# Patient Record
Sex: Female | Born: 1945
Health system: Southern US, Community
[De-identification: ages and names within clinical notes are randomized; demographics above are authoritative.]

## PROBLEM LIST (undated history)

## (undated) DIAGNOSIS — Z923 Personal history of irradiation: Secondary | ICD-10-CM

## (undated) DIAGNOSIS — J449 Chronic obstructive pulmonary disease, unspecified: Secondary | ICD-10-CM

## (undated) DIAGNOSIS — K56609 Unspecified intestinal obstruction, unspecified as to partial versus complete obstruction: Secondary | ICD-10-CM

## (undated) DIAGNOSIS — R0600 Dyspnea, unspecified: Secondary | ICD-10-CM

## (undated) DIAGNOSIS — C50912 Malignant neoplasm of unspecified site of left female breast: Secondary | ICD-10-CM

## (undated) DIAGNOSIS — Z720 Tobacco use: Secondary | ICD-10-CM

## (undated) DIAGNOSIS — G629 Polyneuropathy, unspecified: Secondary | ICD-10-CM

## (undated) DIAGNOSIS — G8929 Other chronic pain: Secondary | ICD-10-CM

## (undated) DIAGNOSIS — M5136 Other intervertebral disc degeneration, lumbar region: Secondary | ICD-10-CM

## (undated) DIAGNOSIS — Z9221 Personal history of antineoplastic chemotherapy: Secondary | ICD-10-CM

## (undated) DIAGNOSIS — E785 Hyperlipidemia, unspecified: Secondary | ICD-10-CM

## (undated) DIAGNOSIS — Z8489 Family history of other specified conditions: Secondary | ICD-10-CM

## (undated) DIAGNOSIS — K219 Gastro-esophageal reflux disease without esophagitis: Secondary | ICD-10-CM

## (undated) DIAGNOSIS — C189 Malignant neoplasm of colon, unspecified: Secondary | ICD-10-CM

## (undated) DIAGNOSIS — I10 Essential (primary) hypertension: Secondary | ICD-10-CM

## (undated) DIAGNOSIS — M810 Age-related osteoporosis without current pathological fracture: Secondary | ICD-10-CM

## (undated) DIAGNOSIS — D509 Iron deficiency anemia, unspecified: Secondary | ICD-10-CM

## (undated) DIAGNOSIS — E559 Vitamin D deficiency, unspecified: Secondary | ICD-10-CM

## (undated) DIAGNOSIS — Z972 Presence of dental prosthetic device (complete) (partial): Secondary | ICD-10-CM

## (undated) DIAGNOSIS — M549 Dorsalgia, unspecified: Secondary | ICD-10-CM

## (undated) DIAGNOSIS — M1990 Unspecified osteoarthritis, unspecified site: Secondary | ICD-10-CM

## (undated) HISTORY — PX: COLON SURGERY: SHX602

## (undated) HISTORY — PX: JOINT REPLACEMENT: SHX530

## (undated) HISTORY — DX: Other chronic pain: G89.29

## (undated) HISTORY — DX: Dorsalgia, unspecified: M54.9

## (undated) HISTORY — PX: FRACTURE SURGERY: SHX138

## (undated) HISTORY — DX: Polyneuropathy, unspecified: G62.9

## (undated) HISTORY — PX: WRIST FRACTURE SURGERY: SHX121

## (undated) HISTORY — PX: COLONOSCOPY W/ POLYPECTOMY: SHX1380

## (undated) HISTORY — DX: Vitamin D deficiency, unspecified: E55.9

## (undated) HISTORY — DX: Unspecified osteoarthritis, unspecified site: M19.90

## (undated) HISTORY — DX: Other intervertebral disc degeneration, lumbar region: M51.36

## (undated) HISTORY — DX: Tobacco use: Z72.0

## (undated) HISTORY — DX: Age-related osteoporosis without current pathological fracture: M81.0

## (undated) HISTORY — PX: BREAST SURGERY: SHX581

## (undated) HISTORY — DX: Unspecified intestinal obstruction, unspecified as to partial versus complete obstruction: K56.609

## (undated) HISTORY — DX: Malignant neoplasm of colon, unspecified: C18.9

## (undated) HISTORY — DX: Essential (primary) hypertension: I10

## (undated) HISTORY — DX: Hyperlipidemia, unspecified: E78.5

## (undated) HISTORY — DX: Iron deficiency anemia, unspecified: D50.9

---
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
## 2002-09-30 DIAGNOSIS — C50912 Malignant neoplasm of unspecified site of left female breast: Secondary | ICD-10-CM

## 2002-09-30 HISTORY — DX: Malignant neoplasm of unspecified site of left female breast: C50.912

## 2003-08-01 HISTORY — PX: BREAST EXCISIONAL BIOPSY: SUR124

## 2004-08-16 ENCOUNTER — Ambulatory Visit: Payer: Self-pay | Admitting: Oncology

## 2004-08-30 ENCOUNTER — Ambulatory Visit: Payer: Self-pay | Admitting: Oncology

## 2004-10-25 ENCOUNTER — Ambulatory Visit: Payer: Self-pay | Admitting: Specialist

## 2004-11-01 ENCOUNTER — Ambulatory Visit: Payer: Self-pay | Admitting: Oncology

## 2004-11-28 ENCOUNTER — Ambulatory Visit: Payer: Self-pay | Admitting: Oncology

## 2005-01-31 ENCOUNTER — Ambulatory Visit: Payer: Self-pay | Admitting: Oncology

## 2005-02-28 ENCOUNTER — Ambulatory Visit: Payer: Self-pay | Admitting: Oncology

## 2005-06-26 ENCOUNTER — Ambulatory Visit: Payer: Self-pay | Admitting: Oncology

## 2005-07-29 ENCOUNTER — Ambulatory Visit: Payer: Self-pay | Admitting: Oncology

## 2005-07-31 ENCOUNTER — Ambulatory Visit: Payer: Self-pay | Admitting: Oncology

## 2005-08-30 ENCOUNTER — Ambulatory Visit: Payer: Self-pay | Admitting: Oncology

## 2005-10-17 ENCOUNTER — Ambulatory Visit: Payer: Self-pay | Admitting: Oncology

## 2005-10-31 ENCOUNTER — Ambulatory Visit: Payer: Self-pay | Admitting: Oncology

## 2005-11-28 ENCOUNTER — Ambulatory Visit: Payer: Self-pay | Admitting: Oncology

## 2006-03-24 ENCOUNTER — Ambulatory Visit: Payer: Self-pay | Admitting: Oncology

## 2006-03-30 ENCOUNTER — Ambulatory Visit: Payer: Self-pay | Admitting: Oncology

## 2006-05-30 ENCOUNTER — Ambulatory Visit: Payer: Self-pay | Admitting: Oncology

## 2006-09-15 ENCOUNTER — Ambulatory Visit: Payer: Self-pay | Admitting: Oncology

## 2006-09-30 ENCOUNTER — Ambulatory Visit: Payer: Self-pay | Admitting: Oncology

## 2007-03-01 ENCOUNTER — Ambulatory Visit: Payer: Self-pay | Admitting: Oncology

## 2007-03-20 ENCOUNTER — Ambulatory Visit: Payer: Self-pay | Admitting: Oncology

## 2007-03-31 ENCOUNTER — Ambulatory Visit: Payer: Self-pay | Admitting: Oncology

## 2007-04-27 ENCOUNTER — Ambulatory Visit: Payer: Self-pay | Admitting: General Surgery

## 2007-06-15 ENCOUNTER — Ambulatory Visit: Payer: Self-pay | Admitting: Nurse Practitioner

## 2007-09-21 ENCOUNTER — Ambulatory Visit: Payer: Self-pay | Admitting: Oncology

## 2007-10-01 ENCOUNTER — Ambulatory Visit: Payer: Self-pay | Admitting: Oncology

## 2007-12-15 ENCOUNTER — Ambulatory Visit: Payer: Self-pay | Admitting: Oncology

## 2007-12-23 ENCOUNTER — Ambulatory Visit: Payer: Self-pay | Admitting: Family Medicine

## 2008-03-22 ENCOUNTER — Ambulatory Visit: Payer: Self-pay | Admitting: Oncology

## 2008-03-23 ENCOUNTER — Ambulatory Visit: Payer: Self-pay | Admitting: Oncology

## 2008-03-30 ENCOUNTER — Ambulatory Visit: Payer: Self-pay | Admitting: Oncology

## 2008-07-18 ENCOUNTER — Ambulatory Visit: Payer: Self-pay | Admitting: Oncology

## 2008-07-26 ENCOUNTER — Encounter: Payer: Self-pay | Admitting: Physical Medicine and Rehabilitation

## 2008-07-31 ENCOUNTER — Encounter: Payer: Self-pay | Admitting: Physical Medicine and Rehabilitation

## 2008-08-30 ENCOUNTER — Ambulatory Visit: Payer: Self-pay | Admitting: Oncology

## 2008-08-30 ENCOUNTER — Encounter: Payer: Self-pay | Admitting: Physical Medicine and Rehabilitation

## 2008-09-15 ENCOUNTER — Ambulatory Visit: Payer: Self-pay | Admitting: Oncology

## 2008-09-30 ENCOUNTER — Ambulatory Visit: Payer: Self-pay | Admitting: Oncology

## 2011-03-17 ENCOUNTER — Emergency Department: Payer: Self-pay | Admitting: Emergency Medicine

## 2011-12-30 HISTORY — PX: NECK SURGERY: SHX720

## 2012-01-25 HISTORY — PX: HIP FRACTURE SURGERY: SHX118

## 2012-01-26 ENCOUNTER — Inpatient Hospital Stay: Payer: Self-pay | Admitting: Specialist

## 2012-01-26 LAB — COMPREHENSIVE METABOLIC PANEL
Albumin: 3.5 g/dL (ref 3.4–5.0)
Alkaline Phosphatase: 87 U/L (ref 50–136)
BUN: 19 mg/dL — ABNORMAL HIGH (ref 7–18)
Calcium, Total: 8.7 mg/dL (ref 8.5–10.1)
Chloride: 106 mmol/L (ref 98–107)
Co2: 25 mmol/L (ref 21–32)
Creatinine: 0.9 mg/dL (ref 0.60–1.30)
Potassium: 3.9 mmol/L (ref 3.5–5.1)

## 2012-01-26 LAB — CBC WITH DIFFERENTIAL/PLATELET
Basophil #: 0.1 10*3/uL (ref 0.0–0.1)
Basophil %: 0.7 %
Lymphocyte %: 14.5 %
MCV: 97 fL (ref 80–100)
Monocyte #: 1.3 x10 3/mm — ABNORMAL HIGH (ref 0.2–0.9)
Monocyte %: 9.5 %
Neutrophil %: 74.1 %
Platelet: 332 10*3/uL (ref 150–440)
RBC: 4.34 10*6/uL (ref 3.80–5.20)
RDW: 12.8 % (ref 11.5–14.5)
WBC: 13.7 10*3/uL — ABNORMAL HIGH (ref 3.6–11.0)

## 2012-01-26 LAB — TSH: Thyroid Stimulating Horm: 7.78 u[IU]/mL — ABNORMAL HIGH

## 2012-01-26 LAB — APTT: Activated PTT: 28.6 secs (ref 23.6–35.9)

## 2012-01-26 LAB — URINALYSIS, COMPLETE
Bilirubin,UR: NEGATIVE
Glucose,UR: NEGATIVE mg/dL (ref 0–75)
Ketone: NEGATIVE
Leukocyte Esterase: NEGATIVE
Nitrite: NEGATIVE
Specific Gravity: 1.021 (ref 1.003–1.030)
WBC UR: 5 /HPF (ref 0–5)

## 2012-01-26 LAB — CK: CK, Total: 62 U/L (ref 21–215)

## 2012-01-26 LAB — PROTIME-INR: Prothrombin Time: 12.5 secs (ref 11.5–14.7)

## 2012-01-27 LAB — CBC WITH DIFFERENTIAL/PLATELET
Basophil #: 0 10*3/uL (ref 0.0–0.1)
Basophil %: 0.2 %
Eosinophil #: 0 10*3/uL (ref 0.0–0.7)
HCT: 32.1 % — ABNORMAL LOW (ref 35.0–47.0)
Lymphocyte #: 1.9 10*3/uL (ref 1.0–3.6)
Lymphocyte %: 9.7 %
Lymphocytes: 9 %
MCH: 32.6 pg (ref 26.0–34.0)
MCHC: 33.8 g/dL (ref 32.0–36.0)
MCV: 97 fL (ref 80–100)
Monocyte #: 2.6 x10 3/mm — ABNORMAL HIGH (ref 0.2–0.9)
Monocyte %: 12.9 %
Monocytes: 13 %
Neutrophil #: 15.4 10*3/uL — ABNORMAL HIGH (ref 1.4–6.5)
Neutrophil %: 77.2 %
RBC: 3.33 10*6/uL — ABNORMAL LOW (ref 3.80–5.20)
RDW: 12.6 % (ref 11.5–14.5)
WBC: 20 10*3/uL — ABNORMAL HIGH (ref 3.6–11.0)

## 2012-01-27 LAB — BASIC METABOLIC PANEL
Anion Gap: 9 (ref 7–16)
BUN: 15 mg/dL (ref 7–18)
Calcium, Total: 7.7 mg/dL — ABNORMAL LOW (ref 8.5–10.1)
Co2: 25 mmol/L (ref 21–32)
EGFR (African American): >60
EGFR (Non-African Amer.): >60
Glucose: 119 mg/dL — ABNORMAL HIGH (ref 65–99)
Osmolality: 274 (ref 275–301)
Sodium: 136 mmol/L (ref 136–145)

## 2012-01-27 LAB — HEMOGLOBIN A1C: Hemoglobin A1C: 5.8 % (ref 4.2–6.3)

## 2012-01-28 LAB — HEMOGLOBIN: HGB: 9.3 g/dL — ABNORMAL LOW (ref 12.0–16.0)

## 2012-01-28 LAB — URINE CULTURE

## 2012-01-29 LAB — CBC WITH DIFFERENTIAL/PLATELET
Basophil #: 0.1 10*3/uL (ref 0.0–0.1)
Eosinophil %: 0.8 %
HCT: 26.1 % — ABNORMAL LOW (ref 35.0–47.0)
HGB: 8.7 g/dL — ABNORMAL LOW (ref 12.0–16.0)
Lymphocyte #: 1.9 10*3/uL (ref 1.0–3.6)
Lymphocyte %: 14.2 %
MCH: 32.2 pg (ref 26.0–34.0)
MCHC: 33.4 g/dL (ref 32.0–36.0)
MCV: 96 fL (ref 80–100)
Neutrophil #: 9.2 10*3/uL — ABNORMAL HIGH (ref 1.4–6.5)
RBC: 2.71 10*6/uL — ABNORMAL LOW (ref 3.80–5.20)
WBC: 13.2 10*3/uL — ABNORMAL HIGH (ref 3.6–11.0)

## 2012-11-30 ENCOUNTER — Ambulatory Visit: Payer: Self-pay | Admitting: Family Medicine

## 2013-04-30 DIAGNOSIS — M5136 Other intervertebral disc degeneration, lumbar region: Secondary | ICD-10-CM

## 2013-04-30 DIAGNOSIS — M51369 Other intervertebral disc degeneration, lumbar region without mention of lumbar back pain or lower extremity pain: Secondary | ICD-10-CM

## 2013-04-30 HISTORY — DX: Other intervertebral disc degeneration, lumbar region: M51.36

## 2013-04-30 HISTORY — DX: Other intervertebral disc degeneration, lumbar region without mention of lumbar back pain or lower extremity pain: M51.369

## 2013-05-05 ENCOUNTER — Ambulatory Visit: Payer: Self-pay | Admitting: Family Medicine

## 2013-09-29 ENCOUNTER — Encounter: Payer: Self-pay | Admitting: Family Medicine

## 2013-09-30 ENCOUNTER — Encounter: Payer: Self-pay | Admitting: Family Medicine

## 2013-10-31 ENCOUNTER — Encounter: Payer: Self-pay | Admitting: Family Medicine

## 2013-11-28 ENCOUNTER — Encounter: Payer: Self-pay | Admitting: Family Medicine

## 2013-12-02 ENCOUNTER — Ambulatory Visit: Payer: Self-pay | Admitting: Family Medicine

## 2014-06-24 ENCOUNTER — Ambulatory Visit: Payer: Self-pay | Admitting: Family Medicine

## 2014-10-06 DIAGNOSIS — M9903 Segmental and somatic dysfunction of lumbar region: Secondary | ICD-10-CM | POA: Diagnosis not present

## 2014-10-06 DIAGNOSIS — M5136 Other intervertebral disc degeneration, lumbar region: Secondary | ICD-10-CM | POA: Diagnosis not present

## 2014-10-06 DIAGNOSIS — M9905 Segmental and somatic dysfunction of pelvic region: Secondary | ICD-10-CM | POA: Diagnosis not present

## 2014-10-06 DIAGNOSIS — M5431 Sciatica, right side: Secondary | ICD-10-CM | POA: Diagnosis not present

## 2014-12-02 DIAGNOSIS — Z0000 Encounter for general adult medical examination without abnormal findings: Secondary | ICD-10-CM | POA: Diagnosis not present

## 2014-12-02 DIAGNOSIS — Z124 Encounter for screening for malignant neoplasm of cervix: Secondary | ICD-10-CM | POA: Diagnosis not present

## 2014-12-02 DIAGNOSIS — Z23 Encounter for immunization: Secondary | ICD-10-CM | POA: Diagnosis not present

## 2014-12-02 DIAGNOSIS — E559 Vitamin D deficiency, unspecified: Secondary | ICD-10-CM | POA: Diagnosis not present

## 2014-12-02 DIAGNOSIS — R062 Wheezing: Secondary | ICD-10-CM | POA: Diagnosis not present

## 2014-12-02 DIAGNOSIS — L8990 Pressure ulcer of unspecified site, unspecified stage: Secondary | ICD-10-CM | POA: Diagnosis not present

## 2014-12-02 DIAGNOSIS — E039 Hypothyroidism, unspecified: Secondary | ICD-10-CM | POA: Diagnosis not present

## 2014-12-02 LAB — HM PAP SMEAR: HM PAP: NORMAL

## 2015-01-05 ENCOUNTER — Ambulatory Visit
Admit: 2015-01-05 | Disposition: A | Discharge status: Home / Self Care | Payer: Self-pay | Attending: Gastroenterology | Admitting: Gastroenterology

## 2015-01-05 DIAGNOSIS — Z86010 Personal history of colonic polyps: Secondary | ICD-10-CM | POA: Diagnosis not present

## 2015-01-05 DIAGNOSIS — F17200 Nicotine dependence, unspecified, uncomplicated: Secondary | ICD-10-CM | POA: Diagnosis not present

## 2015-01-05 DIAGNOSIS — D123 Benign neoplasm of transverse colon: Secondary | ICD-10-CM | POA: Diagnosis not present

## 2015-01-05 DIAGNOSIS — M179 Osteoarthritis of knee, unspecified: Secondary | ICD-10-CM | POA: Diagnosis not present

## 2015-01-05 DIAGNOSIS — Z1211 Encounter for screening for malignant neoplasm of colon: Secondary | ICD-10-CM | POA: Diagnosis not present

## 2015-01-05 DIAGNOSIS — K640 First degree hemorrhoids: Secondary | ICD-10-CM | POA: Diagnosis not present

## 2015-01-05 DIAGNOSIS — D124 Benign neoplasm of descending colon: Secondary | ICD-10-CM | POA: Diagnosis not present

## 2015-01-05 DIAGNOSIS — R062 Wheezing: Secondary | ICD-10-CM | POA: Diagnosis not present

## 2015-01-05 DIAGNOSIS — R69 Illness, unspecified: Secondary | ICD-10-CM | POA: Diagnosis not present

## 2015-01-05 DIAGNOSIS — K621 Rectal polyp: Secondary | ICD-10-CM | POA: Diagnosis not present

## 2015-01-05 DIAGNOSIS — M7989 Other specified soft tissue disorders: Secondary | ICD-10-CM | POA: Diagnosis not present

## 2015-01-22 NOTE — Consult Note (Signed)
Chief Complaint:   Subjective/Chief Complaint Pt seen/examined. Here with right hip fx. Has hx of Breast CA, OA, COPD. Cont. to smoke. No CP. No cardiac hx.   Brief Assessment:   Cardiac Regular    Respiratory wheezing    Gastrointestinal details normal Soft  Nontender  Bowel sounds normal   Routine Hem:  28-Apr-13 00:21    WBC (CBC) 13.7   Hemoglobin (CBC) 14.1   Platelet Count (CBC) 332  Routine Chem:  28-Apr-13 00:21    Glucose, Serum 146   BUN 19   Creatinine (comp) 0.90   Sodium, Serum 141   Potassium, Serum 3.9   Chloride, Serum 106   CO2, Serum 25   Calcium (Total), Serum 8.7   eGFR (Non-African American) >60  Cardiac:  28-Apr-13 00:21    Troponin I < 0.02  Thyroid:  28-Apr-13 00:21    Thyroid Stimulating Hormone 7.78   Assessment/Plan:  Assessment/Plan:   Plan Pt stable and cleared for ortho. surgery. Will start low dose synthroid. Begin SVN's and advair. Empiric low dose beta-blocker. Nicoderm patch. Will follow.   Electronic Signatures: Idelle Crouch (MD)  (Signed 28-Apr-13 02:01)  Authored: Chief Complaint, Brief Assessment, Lab Results, Assessment/Plan   Last Updated: 28-Apr-13 02:01 by Idelle Crouch (MD)

## 2015-01-22 NOTE — Op Note (Signed)
PATIENT NAME:  Kimberly [name], MELLAND MR#:  [id number] DATE OF BIRTH:  1946-08-29  DATE OF PROCEDURE:  01/26/2012  PREOPERATIVE DIAGNOSIS: Base of the neck fracture, left hip.   POSTOPERATIVE DIAGNOSIS: Base of the neck fracture, left hip.  OPERATION: Open reduction internal fixation left basal neck fracture with Synthes DHS compression device (130 degree four hole plate, 85 mm lag screw, four cortical screws).    SURGEON: Park Breed, MD   ANESTHESIA: General endotracheal.   COMPLICATIONS: None.   DRAINS: Two Hemovacs.   ESTIMATED BLOOD LOSS: 250 mL.  REPLACEMENTS: None.   OPERATIVE PROCEDURE: The patient was brought to the operating room where she underwent satisfactory general endotracheal anesthesia at her insistence. Spinal had been recommended but she refused this. The patient was placed on the fracture table in supine position and padded appropriately. The right leg was flexed and abducted. The left leg was placed in traction. Preoperative x-rays had shown a femoral neck-type fracture but it was not subcapital in nature. It was at the base of the neck. I felt this was extracapsular and that hemiarthroplasty was not necessary for this fracture. Fluoroscopy following reduction showed that indeed the fracture was at the base of the neck and at the base of the intertrochanteric region. The hip was prepped and draped in sterile fashion and a longitudinal incision was made along the lateral aspect of the upper femur. Dissection was carried out sharply through subcutaneous tissue and fascia. Muscle was divided and retractors inserted. A large drill hole was made in the proximal femur laterally and guidepin inserted under fluoroscopic control into good position on AP and lateral views. Pin measured 85 mm within bone. A step-cut reamer was then used to enlarge the opening in the lateral femur. A tap was inserted. The tap would advance only so far and then the neck would start to rotate. I put another  guidepin in to try to control rotation but still would rotate if I tried to go too far. This again stopped at about 85 mm. It was a little shorter than I would generally prefer but did not want to damage the bone further. The tap was removed and an 85 mm lag screw with a 130 degree four hole plate was inserted. Plate was fixed to the shaft with a single screw and the traction was released. A compression screw was inserted to compress the fracture site nicely. Fluoroscopy showed anatomic positioning of the neck on AP and lateral views. The screw was in good position and the head a little shorter than usual but in very hard bone. The remaining screw holes were filled in the shaft and the wound was irrigated. Final x-rays were taken. Hemovacs were inserted. The fascia and muscle were closed with running 0 Vicryl. Subcutaneous tissue was closed over another Hemovac with 2-0 Vicryl and the skin was closed with staples. Dry sterile dressings were applied Hemovac was activated. The patient was transferred to her hospital bed and taken to recovery in good condition.  ____________________________ Park Breed, MD hem:drc D: 01/26/2012 19:24:42 ET T: 01/27/2012 10:34:43 ET JOB#: [id number]  cc: Park Breed, MD, <Dictator> Park Breed MD ELECTRONICALLY SIGNED 01/27/2012 11:38

## 2015-01-22 NOTE — H&P (Signed)
Subjective/Chief Complaint Pain left hip    History of Present Illness 69 year old female fell in her yard last night injuring the left hip.  Unable to walk so brought to Emergency Room where exam and X-rays show a displaced base neck fracture left hip.  Admitted for medical evaluation and surgery.  Seen by Dr Doy Hutching and cleared for surgery.  Thyroid studies abnormal and medicine instituted for this.  Discussed options with patient and she agrees to surger;y.  Risks and benefits of surgery were discussed at length including but not limited to infection, non union, nerve or blood vessed damage, non union, need for repeat surgery, blood clots and lung emboli, and death.  Plan on compression nailing.  INitial X-rays appeared to be a subcapital fracture but a repeat X-rays today shows it to be more of a base neck fracture which I believe can be pinned. Explained this to patient and sisters/daughter..    Past Medical Health Thyroid disease.    Primary Physician DR Fuller [place]   Past Med/Surgical Hx:  breast cancer:   Oncology Protocol: MA.27    Pt is on Arimidex.  ALLERGIES:  Betadine: Other  HOME MEDICATIONS: Medication Instructions Status  PriLOSEC OTC 20 mg oral delayed release tablet 1 tab(s) orally once a day Active  Vitamin D3  orally  Active  calcium   orally daily Active   Family and Social History:   Family History Non-Contributory    Social History positive  tobacco (Current within 1 year), negative ETOH    Place of Living Home  Alone   Review of Systems:   Fever/Chills No    Cough No    Sputum No    Abdominal Pain No   Physical Exam:   GEN well developed, well nourished, no acute distress    HEENT pink conjunctivae    NECK supple    RESP normal resp effort    CARD regular rate    ABD denies tenderness    GU foley catheter in place    LYMPH negative neck    EXTR Left leg pain with range of motion.  circulation/sensation/motor function good.  Slight  shortening and external rotation.    SKIN normal to palpation    NEURO motor/sensory function intact    PSYCH alert, A+O to time, place, person, good insight     Routine Coag:  28-Apr-13 00:21    Activated PTT (APTT) 28.6  Routine Hem:  28-Apr-13 00:21    WBC (CBC) 13.7   RBC (CBC) 4.34   Hemoglobin (CBC) 14.1   Hematocrit (CBC) 41.9   Platelet Count (CBC) 332   MCV 97   MCH 32.5   MCHC 33.7   RDW 12.8   Neutrophil % 74.1   Lymphocyte % 14.5   Monocyte % 9.5   Eosinophil % 1.2   Basophil % 0.7   Neutrophil # 10.1   Lymphocyte # 2.0   Monocyte # 1.3   Eosinophil # 0.2   Basophil # 0.1  Routine Chem:  28-Apr-13 00:21    Glucose, Serum 146   BUN 19   Creatinine (comp) 0.90   Sodium, Serum 141   Potassium, Serum 3.9   Chloride, Serum 106   CO2, Serum 25   Calcium (Total), Serum 8.7  Hepatic:  28-Apr-13 00:21    Bilirubin, Total 0.2   Alkaline Phosphatase 87   SGPT (ALT) 19   SGOT (AST) 17   Total Protein, Serum 7.6   Albumin,  Serum 3.5  Routine Chem:  28-Apr-13 00:21    Osmolality (calc) 286   eGFR (African American) >60   eGFR (Non-African American) >60   Anion Gap 10  Routine Coag:  28-Apr-13 00:21    Prothrombin 12.5   INR 0.9  Cardiac:  28-Apr-13 00:21    Troponin I < 0.02  Thyroid:  28-Apr-13 00:21    Thyroid Stimulating Hormone 7.78  Routine BB:  28-Apr-13 00:21    ABO Group + Rh Type -   Antibody Screen -  Cardiac:  28-Apr-13 00:21    CK, Total 62  Thyroid:  28-Apr-13 00:21    Thyroxine, Free 1.26  Routine BB:  28-Apr-13 01:08    Antibody Screen NEGATIVE  Routine UA:  28-Apr-13 01:08    Color (UA) Yellow   Clarity (UA) Hazy   Glucose (UA) Negative   Bilirubin (UA) Negative   Ketones (UA) Negative   Specific Gravity (UA) 1.021   Blood (UA) 2+   pH (UA) 5.0   Protein (UA) Negative   Nitrite (UA) Negative   Leukocyte Esterase (UA) Negative   RBC (UA) 9 /HPF   WBC (UA) 5 /HPF   Epithelial Cells (UA) 1 /HPF   Mucous (UA)  PRESENT  Blood Glucose:  28-Apr-13 03:13    POCT Blood Glucose 125   Radiology Results: XRay:    28-Apr-13 00:54, Hip Left Complete   Hip Left Complete   REASON FOR EXAM:    fell pain  COMMENTS:       PROCEDURE: DXR - DXR HIP LEFT COMPLETE  - Jan 26 2012 12:54AM     RESULT: Comparison:  None    Findings:    AP and lateral view of the left hip demonstrates a left femoral neck   fracture. The joint space is maintained.    IMPRESSION:     Left femoral neck fracture.  Dictation Site: 3          Verified By: Jennette Banker, M.D., MD    28-Apr-13 00:54, Pelvis AP Only   Pelvis AP Only   REASON FOR EXAM:    fell [pain  COMMENTS:       PROCEDURE: DXR - DXR PELVIS AP ONLY  - Jan 26 2012 12:54AM     RESULT: History: fall    Comparison: None    Findings:    AP pelvis demonstrates left femoral neck fracture. There is no   dislocation. Thesacroiliac joints are unremarkable. There is a   calcification projecting over the right sacrum which may represent soft   tissue calcification versus calcified lymph node versus appendicolith     versus bone island.    IMPRESSION:     Left femoral neck fracture.    Dictation Site: 3          Verified By: Jennette Banker, M.D., MD     Assessment/Admission Diagnosis Displaced base neck fracture left hip    Plan Left hip pinning   Electronic Signatures: Park Breed (MD)  (Signed 28-Apr-13 13:50)  Authored: CHIEF COMPLAINT and HISTORY, PAST MEDICAL/SURGIAL HISTORY, ALLERGIES, HOME MEDICATIONS, FAMILY AND SOCIAL HISTORY, REVIEW OF SYSTEMS, PHYSICAL EXAM, LABS, Radiology, ASSESSMENT AND PLAN   Last Updated: 28-Apr-13 13:50 by Park Breed (MD)

## 2015-01-22 NOTE — Discharge Summary (Signed)
PATIENT NAME:  Kimberly [name], Kimberly [name] MR#:  [id number] DATE OF BIRTH:  08/13/1946  DATE OF ADMISSION:  01/26/2012 DATE OF DISCHARGE:  01/30/2012  FINAL DIAGNOSES:  1. Displaced base of neck fracture, left hip. 2. Chronic obstructive pulmonary disease.  3. Tobacco abuse.  4. Osteoarthritis. 5. Status post breast cancer, lumpectomy with radiation.  6. Status post appendectomy.  HOME MEDICATIONS: 1. Vitamin D 1000 units daily.  2. Prilosec 20 mg daily.  3. Caltrate 1 p.o. daily.   OPERATIONS: 01/26/2012 Open reduction and internal fixation left hip with Synthes compression nail.  COMPLICATIONS: None.   CONSULTATIONS:  Dr. Doy Hutching for [facility].   DISCHARGE MEDICATIONS:  1. Enteric-coated aspirin one p.o. b.i.d.  2. Norco 5/325, 1 p.o. every six hours p.r.n. pain.   HISTORY: The patient is a 69 year old female who tripped the morning of admission outside and injured her left hip. She was brought to the Emergency Room where examination and x-rays revealed a displaced base of neck fracture, left hip. She was admitted for medical clearance and surgery. Risks and benefits of surgery were discussed with the patient. I felt that the fracture was close enough to the intertrochanteric region to accept a compression screw rather than doing a hemiarthroplasty. This was explained to the patient and her family.   PAST MEDICAL HISTORY/ILLNESSES: As above.   MEDICATIONS: As above.   ALLERGIES: Betadine.   REVIEW OF SYSTEMS: Unremarkable.   SOCIAL HISTORY: The patient smokes a pack a day but does not drink. She lives alone.   FAMILY HISTORY: Positive for colon cancer, diabetes, and liver disease.   PHYSICAL EXAMINATION: On admission, the patient was alert and cooperative. Vital signs were normal. The left leg was shortened and externally rotated. She had pain with movement of the hip and good neurovascular status distally. No other significant injuries were noted.   LABORATORY DATA: Laboratory data on  admission was satisfactory.   HOSPITAL COURSE: The patient was taken to surgery later on the day of admission and underwent a compression hip nailing. Postoperatively she did well. Hemoglobin remained in a satisfactory range. She was gradually ambulated. The wound was benign. The patient wished to go home rather than a rehab center due to financial considerations. This was felt to be safe as she was ambulating very well by 01/30/2012. She is discharged home with home physical therapy. She is to place minimal weight on the leg with a walker. She is to return to my office in two weeks for exam and x-ray.   ____________________________ Park Breed, MD hem:bjt D: 01/30/2012 12:22:30 ET T: 01/31/2012 10:08:40 ET JOB#: [id number]  cc: Park Breed, MD, <Dictator> [place] Caryn Section, MD Park Breed MD ELECTRONICALLY SIGNED 01/31/2012 12:54

## 2015-01-22 NOTE — Consult Note (Signed)
PATIENT NAME:  Kimberly [name], Kimberly [name] MR#:  [id number] DATE OF BIRTH:  07-01-1946  DATE OF CONSULTATION:  01/26/2012  REFERRING PHYSICIAN: Earnestine Leys, MD   CONSULTING PHYSICIAN:  Leonie Douglas. Doy Hutching, MD  FAMILY PHYSICIAN: Dr. Lelon Huh.   REASON FOR CONSULTATION: Preop medical clearance prior to hip surgery.   HISTORY OF PRESENT ILLNESS: The patient is a 69 year old female with a history of breast cancer and chronic obstructive pulmonary disease/tobacco abuse who tripped this morning injuring her right hip. She was brought to the Emergency Room where she was found to have a hip fracture and is tentatively scheduled for surgery later today. She has a remote history of breast cancer status post lumpectomy and radiation therapy. She is off oral chemotherapy at this time as she completed her protocol. She continues to smoke. She has a history of chronic obstructive pulmonary disease. No cardiac history. Denies chest pain or shortness of breath. No palpitations. No presyncope or syncope.   PAST MEDICAL HISTORY:  1. Chronic obstructive pulmonary disease/tobacco abuse.  2. Osteoarthritis.  3. Breast cancer status post lumpectomy with adjuvant radiation therapy.  4. Status post appendectomy.   MEDICATIONS:  1. Vitamin D 1000 units p.o. daily.  2. Prilosec 20 mg p.o. daily.  3. Caltrate D one p.o. daily.   ALLERGIES: Betadine.   SOCIAL HISTORY: The patient smokes 1 pack per day. No history of alcohol abuse.   FAMILY HISTORY: Positive colon cancer, diabetes, and liver disease.   REVIEW OF SYSTEMS: CONSTITUTIONAL: No fever or change in weight. EYES: No blurred or double vision. No glaucoma. ENT: No tinnitus or hearing loss. No nasal discharge or bleeding. No difficulty swallowing. RESPIRATORY: No cough or wheezing. No hemoptysis. No painful respiration. CARDIOVASCULAR: There is no chest pain or orthopnea. No palpitations or syncope. GASTROINTESTINAL: No nausea, vomiting, or diarrhea. No abdominal pain.  No change in bowel habits. GU: No dysuria or hematuria. No incontinence. ENDOCRINE: No polyuria or polydipsia. No heat or cold intolerance. HEMATOLOGIC: The patient denies anemia, easy bruising, or bleeding. LYMPHATIC: No swollen glands. MUSCULOSKELETAL: The patient denies pain in her neck, back, shoulders or knees. Does have hip pain. No gout. NEUROLOGIC: No numbness or weakness. Denies migraines, stroke or seizures. PSYCH: The patient denies anxiety, insomnia, or depression.   PHYSICAL EXAMINATION:  GENERAL: The patient is in no acute distress.   VITAL SIGNS: Vital signs remarkable for a blood pressure of 129/87 with a heart rate of 93 and a respiratory rate of 18. She is afebrile.   HEENT: Normocephalic, atraumatic. Pupils are equally round and reactive to light and accommodation. Extraocular movements are intact. Sclerae are anicteric. Conjunctivae are clear. Oropharynx is clear.   NECK: Supple without jugular venous distention or bruits. No adenopathy or thyromegaly is noted.   LUNGS: Scattered wheezes without rales or rhonchi. No dullness. Respiratory effort is normal.   CARDIAC: Regular rate and rhythm. Normal S1, S2. No significant rubs, murmurs, or gallops. PMI is nondisplaced. Chest wall is nontender.   ABDOMEN: Soft, nontender with normoactive bowel sounds. No organomegaly or masses were appreciated. No hernias or bruits were noted.   EXTREMITIES: Without clubbing, cyanosis, or edema. Pulses were 2+ bilaterally.   SKIN: Warm and dry without rash or lesions.   NEUROLOGIC: Cranial nerves II through XII grossly intact. Deep tendon reflexes were symmetric. Motor and sensory exam is nonfocal.   PSYCH: Exam revealed a patient who is alert and oriented to person place, and time. She was cooperative and used good judgment.  LABORATORY, DIAGNOSTIC AND RADIOLOGICAL DATA: EKG revealed sinus rhythm with no acute ischemic changes. Chest x-ray was unremarkable. CBC revealed a white count of  13.7 with a hemoglobin of 14.1. Chemistries revealed a TSH of 7.78 with a glucose of 146 and a BUN of 19 with a creatinine of 0.9 and a sodium of 141 and a potassium of 3.9.   ASSESSMENT:  1. Borderline hypothyroidism.  2. Chronic obstructive pulmonary disease/tobacco abuse.  3. Mild dehydration.  4. Hyperglycemia.  5. Osteoarthritis.  6. Remote history of breast cancer.   PLAN:  1. The patient is medically stable and cleared for orthopedic surgery.  2. Will add empiric perioperative beta blockers at this time.  3. We will begin Advair and DuoNeb SVNs for her chronic obstructive pulmonary disease.  4. Will begin low dose Synthroid for her hypothyroidism.  5. We will supplement oxygen at this time and wean as tolerated.   Thank you for the consultation. Please call if questions arise. We will continue to follow this patient with you while in the hospital.   ____________________________ Leonie Douglas. Doy Hutching, MD jds:ap D: 01/26/2012 02:07:05 ET T: 01/26/2012 10:55:40 ET JOB#: [id number]  cc: Leonie Douglas. Doy Hutching, MD, <Dictator> Kirstie Peri. Caryn Section, MD Lavanna Rog Lennice Sites MD ELECTRONICALLY SIGNED 01/26/2012 20:15

## 2015-04-12 ENCOUNTER — Telehealth: Payer: Self-pay | Admitting: Family Medicine

## 2015-04-12 MED ORDER — HYDROCODONE-ACETAMINOPHEN 5-325 MG PO TABS: 1.0000 | ORAL_TABLET | Freq: Four times a day (QID) | ORAL | Status: DC | PRN

## 2015-04-12 NOTE — Telephone Encounter (Signed)
Pt contacted office for refill request on the following medications: Hydrocodone. Pt would like to pick up the RX when she comes in for her OV tomorrow with Mikki Santee @ 11:30. Thanks TNP

## 2015-04-12 NOTE — Telephone Encounter (Signed)
Refill request for hydrocodone 5/325 Last filled by MD on- 02/28/2015 #120 x0 Last Appt: 12/02/2014 Next Appt: 04/13/2015 Please advise refill?

## 2015-04-13 ENCOUNTER — Encounter: Payer: Self-pay | Admitting: Family Medicine

## 2015-04-13 ENCOUNTER — Ambulatory Visit (INDEPENDENT_AMBULATORY_CARE_PROVIDER_SITE_OTHER): Payer: Commercial Managed Care - HMO | Admitting: Family Medicine

## 2015-04-13 VITALS — BP 112/70 | HR 90 | Temp 98.4°F | Resp 16 | Wt 176.0 lb

## 2015-04-13 DIAGNOSIS — K529 Noninfective gastroenteritis and colitis, unspecified: Secondary | ICD-10-CM

## 2015-04-13 NOTE — Progress Notes (Signed)
Subjective:     Patient ID: Kimberly [name], female   DOB: 12-04-1945, 69 y.o.   MRN: [id number]  Diarrhea  Pertinent negatives include no vomiting.    Chief Complaint  Patient presents with  . Diarrhea    Patient comes in office today with concerns of watery stools since Sunday, patient denies seeing any blood in stool but states that she had one episode were there was mucous in stool   States stool frequency is decreasing from 10 x day to 4 x day. Using an unspecified otc product for diarrhea. No family members have been sick. Using bottled and city water. Has not been eating much since yesterday. Last colonoscopy this year with benign polypectomy. No recent abx use or change in medication.   Review of Systems  Gastrointestinal: Positive for diarrhea. Negative for nausea and vomiting.       Objective:   Physical Exam  Constitutional: She appears well-developed and well-nourished. No distress (accompanied by her daughter).  Pulmonary/Chest: Breath sounds normal.  Abdominal: Soft. There is tenderness (diffuse in upper quadrants). There is no guarding.       Assessment:    1. Gastroenteritis:     Plan:   resuming normal eating pattern and use imodium. Consider ultrasound if persistent RUQ tenderness.

## 2015-04-13 NOTE — Patient Instructions (Signed)
Discussed use of imodium and resuming eating. Consider ultrasound if still tender in right upper abdomen.

## 2015-05-19 ENCOUNTER — Other Ambulatory Visit: Payer: Self-pay

## 2015-05-19 NOTE — Telephone Encounter (Signed)
Patient request refill on Hydrocodone.  

## 2015-05-22 MED ORDER — HYDROCODONE-ACETAMINOPHEN 5-325 MG PO TABS: 1.0000 | ORAL_TABLET | Freq: Four times a day (QID) | ORAL | Status: DC | PRN

## 2015-05-25 ENCOUNTER — Telehealth: Payer: Self-pay | Admitting: Family Medicine

## 2015-05-25 DIAGNOSIS — M549 Dorsalgia, unspecified: Principal | ICD-10-CM

## 2015-05-25 DIAGNOSIS — G8929 Other chronic pain: Secondary | ICD-10-CM

## 2015-05-25 DIAGNOSIS — M5137 Other intervertebral disc degeneration, lumbosacral region: Secondary | ICD-10-CM

## 2015-05-25 NOTE — Telephone Encounter (Signed)
Pt has humana and wants a referral to see an orthopedic dr in [facility].  She said she has seen you and you referred her to someone but they told her it was siatica and there was nothing they could do.  Please advise idf she needs an appt or can we just referr there to someone else.  Call back [phone]

## 2015-05-25 NOTE — Telephone Encounter (Signed)
See below please-aa 

## 2015-05-26 DIAGNOSIS — M549 Dorsalgia, unspecified: Principal | ICD-10-CM

## 2015-05-26 DIAGNOSIS — M5137 Other intervertebral disc degeneration, lumbosacral region: Secondary | ICD-10-CM | POA: Insufficient documentation

## 2015-05-26 DIAGNOSIS — G8929 Other chronic pain: Secondary | ICD-10-CM | POA: Insufficient documentation

## 2015-05-26 DIAGNOSIS — M51379 Other intervertebral disc degeneration, lumbosacral region without mention of lumbar back pain or lower extremity pain: Secondary | ICD-10-CM | POA: Insufficient documentation

## 2015-05-26 NOTE — Telephone Encounter (Signed)
Please refer orthopedics as requested for ddd and back pain.

## 2015-06-30 ENCOUNTER — Other Ambulatory Visit: Payer: Self-pay

## 2015-06-30 DIAGNOSIS — M5136 Other intervertebral disc degeneration, lumbar region: Secondary | ICD-10-CM

## 2015-06-30 MED ORDER — HYDROCODONE-ACETAMINOPHEN 5-325 MG PO TABS: 1.0000 | ORAL_TABLET | Freq: Four times a day (QID) | ORAL | Status: DC | PRN

## 2015-06-30 NOTE — Telephone Encounter (Signed)
Patient sent e mail requesting refill 

## 2015-08-07 ENCOUNTER — Other Ambulatory Visit: Payer: Self-pay | Admitting: *Deleted

## 2015-08-07 DIAGNOSIS — M5136 Other intervertebral disc degeneration, lumbar region: Secondary | ICD-10-CM

## 2015-08-07 MED ORDER — HYDROCODONE-ACETAMINOPHEN 5-325 MG PO TABS: 1.0000 | ORAL_TABLET | Freq: Four times a day (QID) | ORAL | Status: DC | PRN

## 2015-09-15 ENCOUNTER — Other Ambulatory Visit: Payer: Self-pay | Admitting: *Deleted

## 2015-09-15 DIAGNOSIS — M5136 Other intervertebral disc degeneration, lumbar region: Secondary | ICD-10-CM

## 2015-09-15 MED ORDER — HYDROCODONE-ACETAMINOPHEN 5-325 MG PO TABS: 1.0000 | ORAL_TABLET | Freq: Four times a day (QID) | ORAL | Status: DC | PRN

## 2015-10-09 ENCOUNTER — Other Ambulatory Visit: Payer: Self-pay | Admitting: Family Medicine

## 2015-10-16 ENCOUNTER — Encounter: Payer: Self-pay | Admitting: Family Medicine

## 2015-10-16 ENCOUNTER — Ambulatory Visit (INDEPENDENT_AMBULATORY_CARE_PROVIDER_SITE_OTHER): Payer: Commercial Managed Care - HMO | Admitting: Family Medicine

## 2015-10-16 VITALS — BP 148/82 | HR 86 | Temp 98.0°F | Resp 16 | Ht 64.0 in | Wt 175.0 lb

## 2015-10-16 DIAGNOSIS — E559 Vitamin D deficiency, unspecified: Secondary | ICD-10-CM | POA: Diagnosis not present

## 2015-10-16 DIAGNOSIS — E785 Hyperlipidemia, unspecified: Secondary | ICD-10-CM

## 2015-10-16 DIAGNOSIS — N644 Mastodynia: Secondary | ICD-10-CM | POA: Diagnosis not present

## 2015-10-16 NOTE — Progress Notes (Signed)
Patient ID: Kimberly [name], female   DOB: 23-May-1946, 70 y.o.   MRN: [id number]       Patient: Kimberly [name] Female    DOB: 03-01-1946   70 y.o.   MRN: [id number] Visit Date: 10/16/2015  Today's Provider: Lelon Huh, MD   Chief Complaint  Patient presents with  . Breast Pain    X 3 weeks.    Subjective:    HPI Breast pain Patient reports that she has had pain in her left breast for about 3 weeks. She reports that she was helping her son lift wood, and thinks she may have pulled a muscle. The pain started about 3 days later. Patient reports that she also has history of breast cancer. She denies feeling any lumps or noticed any visual changes. Patient reports that she has not been taking any OTC for symptoms. She has applied heat which has helped.   She is also due for follow up of lipids and low vitamin D. Reports she is compliant with medications.   No results found for: CHOL  No results found for: LDLCALC  No results found for: CHOLHDL No results found for: LDLDIRECT  Lab Results  Component Value Date   TSH 7.78* 01/26/2012   Past Medical History  Diagnosis Date  . Degenerative disc disease, lumbar 04/2013  . Cancer (HCC)     breast  . Osteoarthritis     of knee  . Vitamin D deficiency   . Hyperlipidemia   . Neuropathy ([place])   . Chronic knee pain   . Chronic back pain   . Osteoporosis   . Tobacco abuse        Allergies  Allergen Reactions  . Iodine   . Sinus Formula [Cholestatin] Nausea Only   Previous Medications   BUDESONIDE-FORMOTEROL (SYMBICORT) 80-4.5 MCG/ACT INHALER    Inhale 2 puffs into the lungs 2 (two) times daily.   CALCIUM CITRATE-VITAMIN D (CITRACAL/VITAMIN D PO)    Take by mouth.   GABAPENTIN (NEURONTIN) 400 MG CAPSULE    Take 400 mg by mouth 3 (three) times daily.   HYDROCODONE-ACETAMINOPHEN (NORCO/VICODIN) 5-325 MG TABLET    Take 1 tablet by mouth every 6 (six) hours as needed. for pain   NAPROXEN (NAPROSYN) 500 MG TABLET    Take 500 mg by  mouth 2 (two) times daily with a meal.   OMEPRAZOLE (PRILOSEC) 20 MG CAPSULE    Take 20 mg by mouth daily.   PROBIOTIC PRODUCT (PROBIOTIC DAILY PO)    Take by mouth.    Review of Systems  Constitutional: Negative.   Cardiovascular: Negative.   Musculoskeletal: Positive for myalgias.  Skin: Negative.   Neurological: Positive for dizziness and headaches.       Comes and goes.     Social History  Substance Use Topics  . Smoking status: Current Every Day Smoker    Types: Cigarettes  . Smokeless tobacco: Not on file  . Alcohol Use: Not on file   Objective:   BP 148/82 mmHg  Pulse 86  Temp(Src) 98 F (36.7 C)  Resp 16  Ht 5\' 4"  (1.626 m)  Wt 175 lb (79.379 kg)  BMI 30.02 kg/m2  SpO2 97%  Physical Exam  Breast Exam: Tender are about 3cm lateral to center of left breast. BB sized firm nodule in the vicinity which she states has been present for many years since having remote lumpectomy. No masses, lesions, tenderness or other disfiguration of right breast.  Assessment & Plan:     1. Breast pain, left MS versus intrinsic breast tissue pain.  - MM Digital Diagnostic Bilat; Future  2. Vitamin D deficiency  - VITAMIN D 25 Hydroxy (Vit-D Deficiency, Fractures)  3. Hyperlipidemia  - Lipid panel - Renal function panel       Lelon Huh, MD  [facility]

## 2015-10-19 ENCOUNTER — Telehealth: Payer: Self-pay

## 2015-10-19 ENCOUNTER — Other Ambulatory Visit: Payer: Self-pay

## 2015-10-19 DIAGNOSIS — N644 Mastodynia: Secondary | ICD-10-CM

## 2015-10-19 NOTE — Telephone Encounter (Signed)
Spoke with [facility] and they said that since patient is due for her mammogram and having left breast pain we would just order screening mammogram. Diagnostic mammogram and [place]  was discontinued. They will contact patient to have this scheduled.

## 2015-10-19 NOTE — Telephone Encounter (Signed)
Patient calling back to check on order for bilateral mammogram (from what i understood). She states partial order was sent in but not for bilateral. In the recent office note the order for bilateral is pulled down (it says future, im not sure if that makes a difference) [place] told patient they have not heard from [place] with the order. Please review and let pt know thank you-aa

## 2015-10-23 ENCOUNTER — Other Ambulatory Visit: Payer: Self-pay | Admitting: Family Medicine

## 2015-11-01 ENCOUNTER — Other Ambulatory Visit: Payer: Self-pay | Admitting: *Deleted

## 2015-11-01 DIAGNOSIS — M5136 Other intervertebral disc degeneration, lumbar region: Secondary | ICD-10-CM

## 2015-11-01 MED ORDER — HYDROCODONE-ACETAMINOPHEN 5-325 MG PO TABS: 1.0000 | ORAL_TABLET | Freq: Four times a day (QID) | ORAL | Status: DC | PRN

## 2015-11-07 ENCOUNTER — Other Ambulatory Visit: Payer: Self-pay | Admitting: Family Medicine

## 2015-11-09 DIAGNOSIS — E559 Vitamin D deficiency, unspecified: Secondary | ICD-10-CM | POA: Diagnosis not present

## 2015-11-09 DIAGNOSIS — E785 Hyperlipidemia, unspecified: Secondary | ICD-10-CM | POA: Diagnosis not present

## 2015-11-10 ENCOUNTER — Telehealth: Payer: Self-pay

## 2015-11-10 DIAGNOSIS — E785 Hyperlipidemia, unspecified: Secondary | ICD-10-CM | POA: Insufficient documentation

## 2015-11-10 LAB — RENAL FUNCTION PANEL
Albumin: 4 g/dL (ref 3.6–4.8)
BUN / CREAT RATIO: 18 (ref 11–26)
BUN: 15 mg/dL (ref 8–27)
CALCIUM: 9.5 mg/dL (ref 8.7–10.3)
CO2: 25 mmol/L (ref 18–29)
Chloride: 97 mmol/L (ref 96–106)
Creatinine, Ser: 0.82 mg/dL (ref 0.57–1.00)
GFR calc non Af Amer: 74 mL/min/{1.73_m2} (ref >59)
GFR, EST AFRICAN AMERICAN: 85 mL/min/{1.73_m2} (ref >59)
GLUCOSE: 106 mg/dL — AB (ref 65–99)
POTASSIUM: 5 mmol/L (ref 3.5–5.2)
Phosphorus: 4.1 mg/dL (ref 2.5–4.5)
Sodium: 139 mmol/L (ref 134–144)

## 2015-11-10 LAB — LIPID PANEL
CHOLESTEROL TOTAL: 267 mg/dL — AB (ref 100–199)
Chol/HDL Ratio: 7.9 ratio units — ABNORMAL HIGH (ref 0.0–4.4)
HDL: 34 mg/dL — ABNORMAL LOW (ref >39)
Triglycerides: 433 mg/dL — ABNORMAL HIGH (ref 0–149)

## 2015-11-10 LAB — VITAMIN D 25 HYDROXY (VIT D DEFICIENCY, FRACTURES): Vit D, 25-Hydroxy: 12.1 ng/mL — ABNORMAL LOW (ref 30.0–100.0)

## 2015-11-10 MED ORDER — PRAVASTATIN SODIUM 40 MG PO TABS: 40.0000 mg | ORAL_TABLET | Freq: Every day | ORAL | Status: DC

## 2015-11-10 MED ORDER — VITAMIN D (ERGOCALCIFEROL) 1.25 MG (50000 UNIT) PO CAPS: 50000.0000 [IU] | ORAL_CAPSULE | ORAL | Status: DC

## 2015-11-10 NOTE — Telephone Encounter (Signed)
Have sent in 90 day prescription. Please have patient schedule follow up at end of April

## 2015-11-10 NOTE — Telephone Encounter (Signed)
Advised patient of results. Patient wants medications called into [facility]. Ok to send? And do I send it as pravastatin #90 with 0 RF and Vit D #12 with 0 RF? Please advise. Thanks!

## 2015-11-10 NOTE — Telephone Encounter (Signed)
-----   Message from Birdie Sons, MD sent at 11/10/2015  7:54 AM EST ----- Cholesterol is very high at 267. Vitamin d is very low. Need to start pravastatin 40mg  once daily, #30, rf x 3, and vitamin d 50,000 units once daily, #12, rf x 1. Follow up 3 months.

## 2015-11-10 NOTE — Telephone Encounter (Signed)
Left message to call back  

## 2015-11-14 NOTE — Telephone Encounter (Signed)
Left detailed message on pt's vm. Okay per dpr.  

## 2015-12-29 ENCOUNTER — Ambulatory Visit
Admission: RE | Admit: 2015-12-29 | Discharge: 2015-12-29 | Disposition: A | Discharge status: Home / Self Care | Payer: Commercial Managed Care - HMO | Source: Ambulatory Visit | Attending: Family Medicine | Admitting: Family Medicine

## 2015-12-29 ENCOUNTER — Other Ambulatory Visit: Payer: Self-pay | Admitting: Family Medicine

## 2015-12-29 DIAGNOSIS — N644 Mastodynia: Secondary | ICD-10-CM

## 2015-12-29 DIAGNOSIS — R921 Mammographic calcification found on diagnostic imaging of breast: Secondary | ICD-10-CM | POA: Diagnosis not present

## 2016-01-09 ENCOUNTER — Other Ambulatory Visit: Payer: Self-pay | Admitting: *Deleted

## 2016-01-09 DIAGNOSIS — M5136 Other intervertebral disc degeneration, lumbar region: Secondary | ICD-10-CM

## 2016-01-09 MED ORDER — HYDROCODONE-ACETAMINOPHEN 5-325 MG PO TABS: 1.0000 | ORAL_TABLET | Freq: Four times a day (QID) | ORAL | Status: DC | PRN

## 2016-01-11 ENCOUNTER — Telehealth: Payer: Self-pay

## 2016-01-11 DIAGNOSIS — M5136 Other intervertebral disc degeneration, lumbar region: Secondary | ICD-10-CM

## 2016-01-11 NOTE — Telephone Encounter (Signed)
Patient is calling office back to see if prescription for Hydrocodone-acetaminophen is available for pick up. I see noted in the chart that this script was printed on 01/09/16 but I do not see that it is placed in book upfront or in accordion, please advise. KW

## 2016-01-12 MED ORDER — HYDROCODONE-ACETAMINOPHEN 5-325 MG PO TABS: 1.0000 | ORAL_TABLET | Freq: Four times a day (QID) | ORAL | Status: DC | PRN

## 2016-01-12 NOTE — Telephone Encounter (Signed)
Pt advised-aa 

## 2016-01-12 NOTE — Telephone Encounter (Signed)
OK to reprint. Thanks.

## 2016-01-12 NOTE — Telephone Encounter (Signed)
Found RX it was on dr fishers desk but not signed, can we re print this out-aa

## 2016-01-12 NOTE — Telephone Encounter (Signed)
Re printed, ready to be signed.

## 2016-02-06 ENCOUNTER — Other Ambulatory Visit: Payer: Self-pay | Admitting: Family Medicine

## 2016-02-20 ENCOUNTER — Other Ambulatory Visit: Payer: Self-pay | Admitting: *Deleted

## 2016-02-20 DIAGNOSIS — M5136 Other intervertebral disc degeneration, lumbar region: Secondary | ICD-10-CM

## 2016-02-20 MED ORDER — HYDROCODONE-ACETAMINOPHEN 5-325 MG PO TABS: 1.0000 | ORAL_TABLET | Freq: Four times a day (QID) | ORAL | Status: DC | PRN

## 2016-04-05 ENCOUNTER — Encounter: Payer: Self-pay | Admitting: Family Medicine

## 2016-04-05 ENCOUNTER — Ambulatory Visit (INDEPENDENT_AMBULATORY_CARE_PROVIDER_SITE_OTHER): Payer: Commercial Managed Care - HMO | Admitting: Family Medicine

## 2016-04-05 VITALS — BP 110/64 | HR 84 | Temp 98.2°F | Resp 16 | Ht 64.0 in | Wt 174.0 lb

## 2016-04-05 DIAGNOSIS — M810 Age-related osteoporosis without current pathological fracture: Secondary | ICD-10-CM | POA: Diagnosis not present

## 2016-04-05 DIAGNOSIS — E785 Hyperlipidemia, unspecified: Secondary | ICD-10-CM | POA: Diagnosis not present

## 2016-04-05 DIAGNOSIS — M5136 Other intervertebral disc degeneration, lumbar region: Secondary | ICD-10-CM | POA: Diagnosis not present

## 2016-04-05 MED ORDER — HYDROCODONE-ACETAMINOPHEN 7.5-325 MG PO TABS: 1.0000 | ORAL_TABLET | Freq: Four times a day (QID) | ORAL | Status: DC | PRN

## 2016-04-05 MED ORDER — PRAVASTATIN SODIUM 40 MG PO TABS: ORAL_TABLET | ORAL | Status: DC

## 2016-04-05 NOTE — Progress Notes (Signed)
Patient: Kimberly [name] Female    DOB: May 29, 1946   70 y.o.   MRN: [id number] Visit Date: 04/05/2016  Today's Provider: Lelon Huh, MD   Chief Complaint  Patient presents with  . Medication Management   Subjective:    HPI   DDD: Current treatment -Hydrocodone/Ace 5-325mg  q6hs prn. She states she has been taking 2 at a time and that pain has been worsening in her back and medications are no longer effective. She does stay active and walks every day but pain is starting to limit her activities.     Lipid/Cholesterol, Follow-up:   Last seen for this 6 months ago.  Management since that visit includes; prescribing pravastatin 40 mg qd..She states she never received medication from her mail order so she never started it.   Last Lipid Panel:    Component Value Date/Time   CHOL 267* 11/09/2015 1238   TRIG 433* 11/09/2015 1238   HDL 34* 11/09/2015 1238   CHOLHDL 7.9* 11/09/2015 1238   [place] Comment 11/09/2015 1238     ------------------------------------------------------------------------  Follow up osteoporosis She has been off of Fosamax for about 2 years. She had previously taken it for several years without adverse effects. Last BMD was 2014    Allergies  Allergen Reactions  . Iodine   . Sinus Formula [Cholestatin] Nausea Only   Current Meds  Medication Sig  . budesonide-formoterol (SYMBICORT) 80-4.5 MCG/ACT inhaler Inhale 2 puffs into the lungs 2 (two) times daily.  . Calcium Citrate-Vitamin D (CITRACAL/VITAMIN D PO) Take by mouth.  . gabapentin (NEURONTIN) 400 MG capsule TAKE 1 CAPSULE TWICE DAILY  . HYDROcodone-acetaminophen (NORCO/VICODIN) 5-325 MG tablet Take 1 tablet by mouth every 6 (six) hours as needed. for pain  . meloxicam (MOBIC) 15 MG tablet TAKE 1 TABLET EVERY DAY AS NEEDED  . naproxen (NAPROSYN) 500 MG tablet Take 500 mg by mouth 2 (two) times daily with a meal.  . omeprazole (PRILOSEC) 20 MG capsule TAKE 1 CAPSULE EVERY DAY  .  pravastatin (PRAVACHOL) 40 MG tablet TAKE 1 TABLET (40 MG TOTAL) BY MOUTH DAILY.  . Probiotic Product (PROBIOTIC DAILY PO) Take by mouth.  . Vitamin D, Ergocalciferol, (DRISDOL) 50000 units CAPS capsule Take 1 capsule (50,000 Units total) by mouth every 7 (seven) days.    Review of Systems  Constitutional: Negative for fever, chills, appetite change and fatigue.  Respiratory: Negative for chest tightness and shortness of breath.   Cardiovascular: Negative for chest pain and palpitations.  Gastrointestinal: Negative for nausea, vomiting and abdominal pain.  Musculoskeletal: Positive for back pain.  Neurological: Negative for dizziness and weakness.    Social History  Substance Use Topics  . Smoking status: Current Every Day Smoker    Types: Cigarettes  . Smokeless tobacco: Not on file  . Alcohol Use: Not on file   Objective:   BP 110/64 mmHg  Pulse 84  Temp(Src) 98.2 F (36.8 C) (Oral)  Resp 16  Ht 5\' 4"  (1.626 m)  Wt 174 lb (78.926 kg)  BMI 29.85 kg/m2  SpO2 95%  Physical Exam   General Appearance:    Alert, cooperative, no distress  Eyes:    PERRL, conjunctiva/corneas clear, EOM's intact       Lungs:     Clear to auscultation bilaterally, respirations unlabored  Heart:    Regular rate and rhythm  Neurologic:   Awake, alert, oriented x 3. No apparent focal neurological  defect. Tender over lumbar spine.           Assessment & Plan:     1. DDD (degenerPaiative disc disease), lumbar Pain is no longer adequately controlled. Will increase to 7.5 1-2 Q6 hours Stress importance of staying physically active.   - HYDROcodone-acetaminophen (NORCO) 7.5-325 MG tablet; Take 1-2 tablets by mouth every 6 (six) hours as needed for moderate pain.   2. Osteoporosis Off Fosamax for two years. Time to check BMD.   Dispense: 120 tablet; Refill: 0  3. Hyperlipidemia Never received pravastatin in mail. Resent prescription today. Will check labs after she has been on  medication for a few months.  - pravastatin (PRAVACHOL) 40 MG tablet; TAKE 1 TABLET (40 MG TOTAL) BY MOUTH DAILY.  Dispense: 90 tablet; Refill: 3       Lelon Huh, MD  [facility]

## 2016-05-07 ENCOUNTER — Ambulatory Visit
Admission: RE | Admit: 2016-05-07 | Discharge: 2016-05-07 | Disposition: A | Discharge status: Home / Self Care | Payer: Commercial Managed Care - HMO | Source: Ambulatory Visit | Attending: Family Medicine | Admitting: Family Medicine

## 2016-05-07 DIAGNOSIS — Z96642 Presence of left artificial hip joint: Secondary | ICD-10-CM | POA: Insufficient documentation

## 2016-05-07 DIAGNOSIS — E2839 Other primary ovarian failure: Secondary | ICD-10-CM | POA: Diagnosis not present

## 2016-05-07 DIAGNOSIS — M8589 Other specified disorders of bone density and structure, multiple sites: Secondary | ICD-10-CM | POA: Diagnosis not present

## 2016-05-07 DIAGNOSIS — Z720 Tobacco use: Secondary | ICD-10-CM | POA: Diagnosis not present

## 2016-05-07 DIAGNOSIS — M85851 Other specified disorders of bone density and structure, right thigh: Secondary | ICD-10-CM | POA: Diagnosis not present

## 2016-05-07 DIAGNOSIS — M8588 Other specified disorders of bone density and structure, other site: Secondary | ICD-10-CM | POA: Diagnosis not present

## 2016-05-07 DIAGNOSIS — Z780 Asymptomatic menopausal state: Secondary | ICD-10-CM | POA: Insufficient documentation

## 2016-05-07 DIAGNOSIS — M810 Age-related osteoporosis without current pathological fracture: Secondary | ICD-10-CM

## 2016-05-08 ENCOUNTER — Other Ambulatory Visit: Payer: Self-pay | Admitting: *Deleted

## 2016-05-08 DIAGNOSIS — M5136 Other intervertebral disc degeneration, lumbar region: Secondary | ICD-10-CM

## 2016-05-08 MED ORDER — HYDROCODONE-ACETAMINOPHEN 7.5-325 MG PO TABS: 1.0000 | ORAL_TABLET | Freq: Four times a day (QID) | ORAL | 0 refills | Status: DC | PRN

## 2016-07-03 ENCOUNTER — Other Ambulatory Visit: Payer: Self-pay | Admitting: *Deleted

## 2016-07-03 DIAGNOSIS — M5136 Other intervertebral disc degeneration, lumbar region: Secondary | ICD-10-CM

## 2016-07-03 MED ORDER — HYDROCODONE-ACETAMINOPHEN 7.5-325 MG PO TABS: 1.0000 | ORAL_TABLET | Freq: Four times a day (QID) | ORAL | 0 refills | Status: DC | PRN

## 2016-07-12 ENCOUNTER — Ambulatory Visit (INDEPENDENT_AMBULATORY_CARE_PROVIDER_SITE_OTHER): Payer: Commercial Managed Care - HMO | Admitting: Family Medicine

## 2016-07-12 ENCOUNTER — Encounter: Payer: Self-pay | Admitting: Family Medicine

## 2016-07-12 VITALS — BP 122/70 | HR 92 | Temp 98.3°F | Resp 16 | Wt 170.0 lb

## 2016-07-12 DIAGNOSIS — G8929 Other chronic pain: Secondary | ICD-10-CM | POA: Diagnosis not present

## 2016-07-12 DIAGNOSIS — E785 Hyperlipidemia, unspecified: Secondary | ICD-10-CM | POA: Diagnosis not present

## 2016-07-12 DIAGNOSIS — M5136 Other intervertebral disc degeneration, lumbar region: Secondary | ICD-10-CM

## 2016-07-12 DIAGNOSIS — Z23 Encounter for immunization: Secondary | ICD-10-CM | POA: Diagnosis not present

## 2016-07-12 DIAGNOSIS — M549 Dorsalgia, unspecified: Secondary | ICD-10-CM

## 2016-07-12 NOTE — Patient Instructions (Signed)
   Recommend taking 81mg enteric coated aspirin to reduce risk of vascular events such as heart attacks and strokes.    

## 2016-07-12 NOTE — Progress Notes (Signed)
Patient: Kimberly [name] Female    DOB: 07-Jun-1946   70 y.o.   MRN: [id number] Visit Date: 07/12/2016  Today's Provider: Lelon Huh, MD   Chief Complaint  Patient presents with  . Follow-up    DDD  . Hyperlipidemia   Subjective:    HPI Follow up DDD: Patient was last seen for tis problem 3 months ago. Changes made include increasing Hydrocodone to 7.5-325mg  1-2 every 6 hours as needed. Patient was counsled of the improtance of staying physically active. Today patient comes in reporting good compliance with treatment, good tolerance and good symptom control. Patient states she has tried to be more physically active around the house. Takes one every morning, sometimes takes another one or two throughout the day.     Lipid/Cholesterol, Follow-up:   Last seen for this3 months ago.  Management changes since that visit include none. A prescription for Pravastatin was resent into the pharmacy. It was noted that we would recheck labs after patient had been on medication for a few months. Is taking every morning.  . Last Lipid Panel:    Component Value Date/Time   CHOL 267 (H) 11/09/2015 1238   TRIG 433 (H) 11/09/2015 1238   HDL 34 (L) 11/09/2015 1238   CHOLHDL 7.9 (H) 11/09/2015 1238   [place] Comment 11/09/2015 1238    Risk factors for vascular disease include hypercholesterolemia  She reports good compliance with treatment. She is not having side effects.  Current symptoms include none and have been stable. Weight trend: decreasing steadily Prior visit with dietician: no Current diet: well balanced Current exercise: walking  Wt Readings from Last 3 Encounters:  04/05/16 174 lb (78.9 kg)  10/16/15 175 lb (79.4 kg)  04/13/15 176 lb (79.8 kg)    -------------------------------------------------------------------     Allergies  Allergen Reactions  . Iodine   . Sinus Formula [Cholestatin] Nausea Only     Current Outpatient Prescriptions:  .   budesonide-formoterol (SYMBICORT) 80-4.5 MCG/ACT inhaler, Inhale 2 puffs into the lungs 2 (two) times daily., Disp: , Rfl:  .  Calcium Citrate-Vitamin D (CITRACAL/VITAMIN D PO), Take by mouth., Disp: , Rfl:  .  gabapentin (NEURONTIN) 400 MG capsule, TAKE 1 CAPSULE TWICE DAILY, Disp: 180 capsule, Rfl: 3 .  HYDROcodone-acetaminophen (NORCO) 7.5-325 MG tablet, Take 1-2 tablets by mouth every 6 (six) hours as needed for moderate pain., Disp: 120 tablet, Rfl: 0 .  meloxicam (MOBIC) 15 MG tablet, TAKE 1 TABLET EVERY DAY AS NEEDED, Disp: 90 tablet, Rfl: 3 .  naproxen (NAPROSYN) 500 MG tablet, Take 500 mg by mouth 2 (two) times daily with a meal., Disp: , Rfl:  .  omeprazole (PRILOSEC) 20 MG capsule, TAKE 1 CAPSULE EVERY DAY, Disp: 90 capsule, Rfl: 4 .  pravastatin (PRAVACHOL) 40 MG tablet, TAKE 1 TABLET (40 MG TOTAL) BY MOUTH DAILY., Disp: 90 tablet, Rfl: 3 .  Probiotic Product (PROBIOTIC DAILY PO), Take by mouth., Disp: , Rfl:  .  Vitamin D, Ergocalciferol, (DRISDOL) 50000 units CAPS capsule, Take 1 capsule (50,000 Units total) by mouth every 7 (seven) days., Disp: 12 capsule, Rfl: 0  Review of Systems  Constitutional: Negative for appetite change, chills, fatigue and fever.  HENT: Positive for mouth sores (inside mouth on right lower side).   Respiratory: Negative for chest tightness and shortness of breath.   Cardiovascular: Negative for chest pain and palpitations.  Gastrointestinal: Negative for abdominal pain, nausea and vomiting.  Musculoskeletal: Positive for arthralgias (hip pain).  Neurological: Positive for numbness (in legs). Negative for dizziness and weakness.    Social History  Substance Use Topics  . Smoking status: Current Every Day Smoker    Packs/day: 0.75    Types: Cigarettes  . Smokeless tobacco: Never Used  . Alcohol use 0.0 oz/week     Comment: occasionally drinks beer   Objective:   BP 122/70 (BP Location: Right Arm, Patient Position: Sitting, Cuff Size: Large)    Pulse 92   Temp 98.3 F (36.8 C) (Oral)   Resp 16   Wt 170 lb (77.1 kg)   SpO2 94% Comment: room air  BMI 29.18 kg/m   Physical Exam   General Appearance:    Alert, cooperative, no distress  Eyes:    PERRL, conjunctiva/corneas clear, EOM's intact       Lungs:     Clear to auscultation bilaterally, respirations unlabored  Heart:    Regular rate and rhythm  Neurologic:   Awake, alert, oriented x 3. No apparent focal neurological           defect.           Assessment & Plan:     1. DDD (degenerative disc disease), lumbar Pain well controlled on current medication regiment.   2. Chronic back pain, unspecified back location, unspecified back pain laterality   3. Hyperlipidemia, unspecified hyperlipidemia type She is tolerating pravastatin well with no adverse effects.   - Lipid panel - Hepatic function panel  4. Need for influenza vaccination  - Flu vaccine HIGH DOSE PF (Fluzone High dose)       Lelon Huh, MD  [facility]

## 2016-07-13 LAB — LIPID PANEL
CHOLESTEROL TOTAL: 202 mg/dL — AB (ref 100–199)
Chol/HDL Ratio: 5.3 ratio units — ABNORMAL HIGH (ref 0.0–4.4)
HDL: 38 mg/dL — ABNORMAL LOW (ref >39)
LDL CALC: 92 mg/dL (ref 0–99)
TRIGLYCERIDES: 362 mg/dL — AB (ref 0–149)
VLDL CHOLESTEROL CAL: 72 mg/dL — AB (ref 5–40)

## 2016-07-13 LAB — HEPATIC FUNCTION PANEL
ALBUMIN: 4.2 g/dL (ref 3.6–4.8)
ALK PHOS: 76 IU/L (ref 39–117)
ALT: 10 IU/L (ref 0–32)
AST: 12 IU/L (ref 0–40)
Bilirubin Total: 0.2 mg/dL (ref 0.0–1.2)
Bilirubin, Direct: 0.06 mg/dL (ref 0.00–0.40)
Total Protein: 7.1 g/dL (ref 6.0–8.5)

## 2016-07-19 ENCOUNTER — Telehealth: Payer: Self-pay | Admitting: Family Medicine

## 2016-07-19 NOTE — Telephone Encounter (Signed)
Called Pt to schedule AWV with [facility] for Nov - knb °

## 2016-08-08 ENCOUNTER — Other Ambulatory Visit: Payer: Self-pay | Admitting: Family Medicine

## 2016-08-08 DIAGNOSIS — M5136 Other intervertebral disc degeneration, lumbar region: Secondary | ICD-10-CM

## 2016-08-09 MED ORDER — HYDROCODONE-ACETAMINOPHEN 7.5-325 MG PO TABS: 1.0000 | ORAL_TABLET | Freq: Four times a day (QID) | ORAL | 0 refills | Status: DC | PRN

## 2016-08-21 ENCOUNTER — Telehealth: Payer: Self-pay | Admitting: Family Medicine

## 2016-08-21 DIAGNOSIS — E559 Vitamin D deficiency, unspecified: Secondary | ICD-10-CM | POA: Insufficient documentation

## 2016-08-21 NOTE — Telephone Encounter (Signed)
Patient has been advised she states that she has been taking her Vitamin D

## 2016-08-21 NOTE — Telephone Encounter (Signed)
Muscle pain with pravastatin is often related to vitamin d deficiency, we prescribed vitamin d for her in February, but I think the prescription has run out.   If she is no longer taking vitamin d she should restart 50,000 units once a week, #12, rf x 4. Stop taking the pravastatin for one month to allow time to get vitamin d levels back up, then restart pravastatin.   Schedule follow up in 2 months.

## 2016-08-21 NOTE — Telephone Encounter (Signed)
Please advise 

## 2016-08-21 NOTE — Telephone Encounter (Signed)
Pt stated that she has been having muscle and stomach pains starting 3 weeks ago and thinks it is caused by taking pravastatin (PRAVACHOL) 40 MG tablet. Pt stated that she started taking the medication 3 months ago. Please advise. Thanks TNP

## 2016-09-20 ENCOUNTER — Other Ambulatory Visit: Payer: Self-pay | Admitting: Family Medicine

## 2016-09-30 DIAGNOSIS — Z9221 Personal history of antineoplastic chemotherapy: Secondary | ICD-10-CM

## 2016-09-30 DIAGNOSIS — C189 Malignant neoplasm of colon, unspecified: Secondary | ICD-10-CM

## 2016-09-30 DIAGNOSIS — Z923 Personal history of irradiation: Secondary | ICD-10-CM

## 2016-09-30 HISTORY — DX: Malignant neoplasm of colon, unspecified: C18.9

## 2016-09-30 HISTORY — DX: Personal history of antineoplastic chemotherapy: Z92.21

## 2016-09-30 HISTORY — DX: Personal history of irradiation: Z92.3

## 2016-10-11 ENCOUNTER — Other Ambulatory Visit: Payer: Self-pay | Admitting: *Deleted

## 2016-10-11 DIAGNOSIS — M5136 Other intervertebral disc degeneration, lumbar region: Secondary | ICD-10-CM

## 2016-10-11 MED ORDER — HYDROCODONE-ACETAMINOPHEN 7.5-325 MG PO TABS: 1.0000 | ORAL_TABLET | Freq: Four times a day (QID) | ORAL | 0 refills | Status: DC | PRN

## 2016-11-08 ENCOUNTER — Other Ambulatory Visit: Payer: Self-pay | Admitting: Family Medicine

## 2016-12-04 ENCOUNTER — Telehealth: Payer: Self-pay | Admitting: Family Medicine

## 2016-12-04 NOTE — Telephone Encounter (Signed)
Called Pt to schedule AWV with [facility] - knb °

## 2016-12-10 ENCOUNTER — Other Ambulatory Visit: Payer: Self-pay | Admitting: *Deleted

## 2016-12-10 DIAGNOSIS — M5136 Other intervertebral disc degeneration, lumbar region: Secondary | ICD-10-CM

## 2016-12-10 MED ORDER — HYDROCODONE-ACETAMINOPHEN 7.5-325 MG PO TABS: 1.0000 | ORAL_TABLET | Freq: Four times a day (QID) | ORAL | 0 refills | Status: DC | PRN

## 2017-01-01 ENCOUNTER — Ambulatory Visit (INDEPENDENT_AMBULATORY_CARE_PROVIDER_SITE_OTHER): Payer: Medicare HMO | Admitting: Physician Assistant

## 2017-01-01 ENCOUNTER — Encounter: Payer: Self-pay | Admitting: Physician Assistant

## 2017-01-01 VITALS — BP 148/80 | HR 84 | Temp 98.3°F | Resp 20 | Wt 172.0 lb

## 2017-01-01 DIAGNOSIS — R1010 Upper abdominal pain, unspecified: Secondary | ICD-10-CM | POA: Diagnosis not present

## 2017-01-01 DIAGNOSIS — F1190 Opioid use, unspecified, uncomplicated: Secondary | ICD-10-CM

## 2017-01-01 MED ORDER — DOCUSATE SODIUM 100 MG PO CAPS: 100.0000 mg | ORAL_CAPSULE | Freq: Every day | ORAL | 0 refills | Status: DC

## 2017-01-01 NOTE — Patient Instructions (Signed)

## 2017-01-01 NOTE — Progress Notes (Signed)
Patient: Kimberly [name] Female    DOB: 12/27/1945   71 y.o.   MRN: [id number] Visit Date: 01/01/2017  Today's Provider: Trinna Post, PA-C   Chief Complaint  Patient presents with  . Abdominal Pain   Subjective:    Abdominal Pain  This is a new problem. The current episode started more than 1 month ago (since February). The problem occurs constantly. The problem has been gradually worsening. The pain is located in the generalized abdominal region. The pain is at a severity of 8/10. The pain is severe. The quality of the pain is sharp. The abdominal pain does not radiate. Associated symptoms include diarrhea (at onset; has resolved), flatus, headaches, nausea (at onset; has resolved) and weight loss. Pertinent negatives include no anorexia, belching, constipation, dysuria, fever, frequency, hematochezia, hematuria, melena or vomiting. Nothing aggravates the pain. The pain is relieved by nothing. Treatments tried: laxatives, walking, hydrocodone, increased water intake. The treatment provided no relief.   Patient is a 71 y/o woman w/ hx of DDD on chronic opioid therapy 7.5/325 Norco for 5-6 years, also on Meloxicam 15 mg daily presenting with abdominal pain ongoing since one month. Occurs to some extent constantly but has spasms of wrosening pain. Locates it across the abdomen. Doesn't radiate. No fevers, chills.     Allergies  Allergen Reactions  . Iodine   . Sinus Formula [Cholestatin] Nausea Only     Current Outpatient Prescriptions:  .  budesonide-formoterol (SYMBICORT) 80-4.5 MCG/ACT inhaler, Inhale 2 puffs into the lungs 2 (two) times daily., Disp: , Rfl:  .  Calcium Citrate-Vitamin D (CITRACAL/VITAMIN D PO), Take by mouth., Disp: , Rfl:  .  HYDROcodone-acetaminophen (NORCO) 7.5-325 MG tablet, Take 1-2 tablets by mouth every 6 (six) hours as needed for moderate pain., Disp: 120 tablet, Rfl: 0 .  meloxicam (MOBIC) 15 MG tablet, TAKE 1 TABLET EVERY DAY AS NEEDED, Disp: 90  tablet, Rfl: 3 .  naproxen (NAPROSYN) 500 MG tablet, Take 500 mg by mouth 2 (two) times daily with a meal., Disp: , Rfl:  .  omeprazole (PRILOSEC) 20 MG capsule, TAKE 1 CAPSULE EVERY DAY, Disp: 90 capsule, Rfl: 4 .  pravastatin (PRAVACHOL) 40 MG tablet, TAKE 1 TABLET (40 MG TOTAL) BY MOUTH DAILY., Disp: 90 tablet, Rfl: 3 .  Probiotic Product (PROBIOTIC DAILY PO), Take by mouth., Disp: , Rfl:  .  Vitamin D, Ergocalciferol, (DRISDOL) 50000 units CAPS capsule, Take 1 capsule (50,000 Units total) by mouth every 7 (seven) days., Disp: 12 capsule, Rfl: 0  Review of Systems  Constitutional: Positive for weight loss. Negative for fever.  Gastrointestinal: Positive for abdominal pain, diarrhea (at onset; has resolved), flatus and nausea (at onset; has resolved). Negative for anorexia, constipation, hematochezia, melena and vomiting.  Genitourinary: Negative for dysuria, frequency and hematuria.  Neurological: Positive for headaches.    Social History  Substance Use Topics  . Smoking status: Current Every Day Smoker    Packs/day: 0.75    Types: Cigarettes  . Smokeless tobacco: Never Used  . Alcohol use 0.0 oz/week     Comment: occasionally drinks beer   Objective:   BP (!) 148/80 (BP Location: Right Arm, Patient Position: Sitting, Cuff Size: Normal)   Pulse 84   Temp 98.3 F (36.8 C) (Oral)   Resp 20   Wt 172 lb (78 kg)   BMI 29.52 kg/m  Vitals:   01/01/17 1629  BP: (!) 148/80  Pulse: 84  Resp: 20  Temp:  98.3 F (36.8 C)  TempSrc: Oral  Weight: 172 lb (78 kg)     Physical Exam  Constitutional: She is oriented to person, place, and time. She appears well-developed and well-nourished.  Cardiovascular: Normal rate and regular rhythm.   Pulmonary/Chest: Effort normal and breath sounds normal.  Abdominal: Soft. She exhibits distension. She exhibits no fluid wave, no abdominal bruit, no ascites, no pulsatile midline mass and no mass. There is tenderness in the epigastric area. There  is no rigidity, no rebound, no guarding, no CVA tenderness, no tenderness at McBurney's point and negative Murphy's sign.  Neurological: She is alert and oriented to person, place, and time.  Skin: Skin is warm and dry.  Psychiatric: She has a normal mood and affect. Her behavior is normal.          Assessment & Plan:     1. Pain of upper abdomen  Patient afebrile, nontoxic in office today with normal vital signs. Suspect constipation from chronic opioid use. Will evaluate as below for infection, organ function, pancreatitis, gallbladder and liver pathology. KUB to assess stool and bowel gas pattern as well as obstruction. Provided patient with stool softener today. Patient also taking daily Mobic. Will await lab results and then possibly focus on reducing this, does take daily PPI.   - Comprehensive metabolic panel - CBC with Differential/Platelet - Amylase - Lipase - docusate sodium (COLACE) 100 MG capsule; Take 1 capsule (100 mg total) by mouth daily.  Dispense: 14 capsule; Refill: 0 - DG Abd 1 View; Future  2. Chronic, continuous use of opioids  - docusate sodium (COLACE) 100 MG capsule; Take 1 capsule (100 mg total) by mouth daily.  Dispense: 14 capsule; Refill: 0      The entirety of the information documented in the History of Present Illness, Review of Systems and Physical Exam were personally obtained by me. Portions of this information were initially documented by Raquel Sarna D and reviewed by me for thoroughness and accuracy.   Return in about 2 weeks (around 01/15/2017) for Dr. Caryn Section.     Trinna Post, PA-C  [facility]

## 2017-01-02 ENCOUNTER — Other Ambulatory Visit: Payer: Self-pay | Admitting: Physician Assistant

## 2017-01-02 ENCOUNTER — Ambulatory Visit
Admission: RE | Admit: 2017-01-02 | Discharge: 2017-01-02 | Disposition: A | Discharge status: Home / Self Care | Payer: Commercial Managed Care - HMO | Source: Ambulatory Visit | Attending: Physician Assistant | Admitting: Physician Assistant

## 2017-01-02 DIAGNOSIS — R109 Unspecified abdominal pain: Secondary | ICD-10-CM

## 2017-01-02 DIAGNOSIS — K6389 Other specified diseases of intestine: Secondary | ICD-10-CM | POA: Diagnosis not present

## 2017-01-02 DIAGNOSIS — K5900 Constipation, unspecified: Secondary | ICD-10-CM | POA: Diagnosis not present

## 2017-01-02 DIAGNOSIS — R1010 Upper abdominal pain, unspecified: Secondary | ICD-10-CM

## 2017-01-02 NOTE — Progress Notes (Signed)
Spoke with patient and discussed Xray results with small distended loops of bowel. Advised her to stop taking stool softener and to stop eating/drinking. She may have small sips of water but should really be resting her bowels, needs to minimize or ideally eliminate her medication use except for her inhaler as she is not on anything critical to her functioning. Have ordered follow up abdominal imaging 2 view for tomorrow. Have instructed her that if she has increased abdominal pain, unable to have BM or pass gas, she needs to go to hospital. Patient is currently able to pass gas and have bowel movement.

## 2017-01-03 ENCOUNTER — Ambulatory Visit
Admission: RE | Admit: 2017-01-03 | Discharge: 2017-01-03 | Disposition: A | Discharge status: Home / Self Care | Payer: Medicare HMO | Source: Ambulatory Visit | Attending: Physician Assistant | Admitting: Physician Assistant

## 2017-01-03 DIAGNOSIS — R109 Unspecified abdominal pain: Secondary | ICD-10-CM | POA: Insufficient documentation

## 2017-01-03 LAB — CBC WITH DIFFERENTIAL/PLATELET
Basophils Absolute: 0.1 10*3/uL (ref 0.0–0.2)
Basos: 1 %
EOS (ABSOLUTE): 0.4 10*3/uL (ref 0.0–0.4)
Eos: 3 %
Hematocrit: 37.3 % (ref 34.0–46.6)
Hemoglobin: 12.2 g/dL (ref 11.1–15.9)
Immature Grans (Abs): 0 10*3/uL (ref 0.0–0.1)
Immature Granulocytes: 0 %
Lymphocytes Absolute: 3.9 10*3/uL — ABNORMAL HIGH (ref 0.7–3.1)
Lymphs: 29 %
MCH: 28 pg (ref 26.6–33.0)
MCHC: 32.7 g/dL (ref 31.5–35.7)
MCV: 86 fL (ref 79–97)
Monocytes Absolute: 1.4 10*3/uL — ABNORMAL HIGH (ref 0.1–0.9)
Monocytes: 10 %
Neutrophils Absolute: 7.8 10*3/uL — ABNORMAL HIGH (ref 1.4–7.0)
Neutrophils: 57 %
Platelets: 486 10*3/uL — ABNORMAL HIGH (ref 150–379)
RBC: 4.36 x10E6/uL (ref 3.77–5.28)
RDW: 16.1 % — ABNORMAL HIGH (ref 12.3–15.4)
WBC: 13.6 10*3/uL — ABNORMAL HIGH (ref 3.4–10.8)

## 2017-01-03 LAB — COMPREHENSIVE METABOLIC PANEL
ALT: 7 IU/L (ref 0–32)
AST: 11 IU/L (ref 0–40)
Albumin/Globulin Ratio: 1.3 (ref 1.2–2.2)
Albumin: 3.9 g/dL (ref 3.6–4.8)
Alkaline Phosphatase: 73 IU/L (ref 39–117)
BUN/Creatinine Ratio: 19 (ref 12–28)
BUN: 14 mg/dL (ref 8–27)
Bilirubin Total: <0.2 mg/dL (ref 0.0–1.2)
CO2: 26 mmol/L (ref 18–29)
Calcium: 8.9 mg/dL (ref 8.7–10.3)
Chloride: 100 mmol/L (ref 96–106)
Creatinine, Ser: 0.74 mg/dL (ref 0.57–1.00)
GFR calc Af Amer: 96 mL/min/{1.73_m2} (ref >59)
GFR calc non Af Amer: 83 mL/min/{1.73_m2} (ref >59)
Globulin, Total: 3.1 g/dL (ref 1.5–4.5)
Glucose: 71 mg/dL (ref 65–99)
Potassium: 4.9 mmol/L (ref 3.5–5.2)
Sodium: 141 mmol/L (ref 134–144)
Total Protein: 7 g/dL (ref 6.0–8.5)

## 2017-01-03 LAB — LIPASE: Lipase: 20 U/L (ref 14–72)

## 2017-01-03 LAB — AMYLASE: Amylase: 53 U/L (ref 31–124)

## 2017-01-10 ENCOUNTER — Encounter: Payer: Self-pay | Admitting: Family Medicine

## 2017-01-10 ENCOUNTER — Ambulatory Visit (INDEPENDENT_AMBULATORY_CARE_PROVIDER_SITE_OTHER): Payer: Medicare HMO | Admitting: Family Medicine

## 2017-01-10 ENCOUNTER — Ambulatory Visit (INDEPENDENT_AMBULATORY_CARE_PROVIDER_SITE_OTHER): Payer: Medicare HMO

## 2017-01-10 ENCOUNTER — Encounter: Payer: Self-pay | Admitting: *Deleted

## 2017-01-10 VITALS — BP 144/74 | HR 84 | Temp 98.8°F | Ht 64.0 in | Wt 169.8 lb

## 2017-01-10 DIAGNOSIS — E785 Hyperlipidemia, unspecified: Secondary | ICD-10-CM | POA: Diagnosis not present

## 2017-01-10 DIAGNOSIS — E559 Vitamin D deficiency, unspecified: Secondary | ICD-10-CM | POA: Diagnosis not present

## 2017-01-10 DIAGNOSIS — Z0000 Encounter for general adult medical examination without abnormal findings: Secondary | ICD-10-CM

## 2017-01-10 DIAGNOSIS — F17210 Nicotine dependence, cigarettes, uncomplicated: Secondary | ICD-10-CM | POA: Insufficient documentation

## 2017-01-10 DIAGNOSIS — Z86010 Personal history of colonic polyps: Secondary | ICD-10-CM

## 2017-01-10 DIAGNOSIS — K5900 Constipation, unspecified: Secondary | ICD-10-CM | POA: Diagnosis not present

## 2017-01-10 DIAGNOSIS — Z1159 Encounter for screening for other viral diseases: Secondary | ICD-10-CM

## 2017-01-10 NOTE — Progress Notes (Signed)
Subjective:   Kimberly [name] is a 71 y.o. female who presents for Medicare Annual (Subsequent) preventive examination.  Review of Systems:  N/A Cardiac Risk Factors include: advanced age (>5men, >22 women);smoking/ tobacco exposure;dyslipidemia     Objective:     Vitals: BP (!) 144/74 (BP Location: Right Arm)   Pulse 84   Temp 98.8 F (37.1 C) (Oral)   Ht 5\' 4"  (1.626 m)   Wt 169 lb 12.8 oz (77 kg)   BMI 29.15 kg/m   Body mass index is 29.15 kg/m.   Tobacco History  Smoking Status  . Current Every Day Smoker  . Packs/day: 0.75  . Types: Cigarettes  Smokeless Tobacco  . Never Used     Ready to quit: Not Answered Counseling given: Not Answered   Past Medical History:  Diagnosis Date  . Cancer (HCC)    breast  . Chronic back pain   . Chronic knee pain   . Degenerative disc disease, lumbar 04/2013  . Hyperlipidemia   . Neuropathy ([place])   . Osteoarthritis    of knee  . Osteoporosis   . Tobacco abuse   . Vitamin D deficiency    Past Surgical History:  Procedure Laterality Date  . APPENDECTOMY  1965  . BREAST EXCISIONAL BIOPSY Left 08/01/2003   lumpectomy rad 11/04-2/28/2005  . CESAREAN SECTION     x3  . COLONOSCOPY W/ POLYPECTOMY    . HIP FRACTURE SURGERY Left 01/25/2012  . NECK SURGERY  12/2011  . WRIST FRACTURE SURGERY     Family History  Problem Relation Age of Onset  . Diabetes Mother     type 2  . Coronary artery disease Mother   . Deep vein thrombosis Mother   . Cancer Father   . Asthma Brother   . Diabetes Brother     type 2   History  Sexual Activity  . Sexual activity: Not on file    Outpatient Encounter Prescriptions as of 01/10/2017  Medication Sig  . budesonide-formoterol (SYMBICORT) 80-4.5 MCG/ACT inhaler Inhale 2 puffs into the lungs 2 (two) times daily.  . Calcium Citrate-Vitamin D (CITRACAL/VITAMIN D PO) Take by mouth.  . docusate sodium (COLACE) 100 MG capsule Take 1 capsule (100 mg total) by mouth daily.  Marland Kitchen gabapentin  (NEURONTIN) 300 MG capsule Take 300 mg by mouth.  Marland Kitchen HYDROcodone-acetaminophen (NORCO) 7.5-325 MG tablet Take 1-2 tablets by mouth every 6 (six) hours as needed for moderate pain.  . naproxen (NAPROSYN) 500 MG tablet Take 500 mg by mouth 2 (two) times daily with a meal.  . omeprazole (PRILOSEC) 20 MG capsule TAKE 1 CAPSULE EVERY DAY  . pravastatin (PRAVACHOL) 40 MG tablet TAKE 1 TABLET (40 MG TOTAL) BY MOUTH DAILY.  . Probiotic Product (PROBIOTIC DAILY PO) Take by mouth.  . Vitamin D, Ergocalciferol, (DRISDOL) 50000 units CAPS capsule Take 1 capsule (50,000 Units total) by mouth every 7 (seven) days.  . meloxicam (MOBIC) 15 MG tablet TAKE 1 TABLET EVERY DAY AS NEEDED (Patient not taking: Reported on 01/10/2017)   No facility-administered encounter medications on file as of 01/10/2017.     Activities of Daily Living In your present state of health, do you have any difficulty performing the following activities: 01/10/2017  Hearing? N  Vision? N  Difficulty concentrating or making decisions? N  Walking or climbing stairs? Y  Dressing or bathing? N  Doing errands, shopping? N  Preparing Food and eating ? N  Using the Toilet? N  In  the past six months, have you accidently leaked urine? Y  Do you have problems with loss of bowel control? N  Managing your Medications? N  Managing your Finances? N  Housekeeping or managing your Housekeeping? N  Some recent data might be hidden    Patient Care Team: Birdie Sons, MD as PCP - General (Family Medicine)    Assessment:     Exercise Activities and Dietary recommendations Current Exercise Habits: Home exercise routine, Time (Minutes): 60, Frequency (Times/Week): 7, Weekly Exercise (Minutes/Week): 420, Intensity: Mild  Goals    . Decrease sugar intake          Recommend cutting down on sugar and sweets to eating 1 small portion a day versus 3 a day.      Fall Risk Fall Risk  01/10/2017 07/12/2016  Falls in the past year? No No    Depression Screen PHQ 2/9 Scores 01/10/2017 01/10/2017 07/12/2016  PHQ - 2 Score 0 0 0  PHQ- 9 Score 0 - -     Cognitive Function     6CIT Screen 01/10/2017  What Year? 0 points  What month? 0 points  What time? 0 points  Count back from 20 0 points  Months in reverse 2 points  Repeat phrase 0 points  Total Score 2    Immunization History  Administered Date(s) Administered  . Influenza, High Dose Seasonal PF 07/12/2016  . Influenza-Unspecified 08/30/2013  . Pneumococcal Conjugate-13 06/30/2014  . Pneumococcal Polysaccharide-23 03/02/2013  . Tdap 12/02/2014  . Zoster 12/02/2014   Screening Tests Health Maintenance  Topic Date Due  . COLONOSCOPY  04/26/2017  . INFLUENZA VACCINE  04/30/2017  . MAMMOGRAM  12/28/2017  . TETANUS/TDAP  12/01/2024  . DEXA SCAN  Completed  . Hepatitis C Screening  Completed  . PNA vac Low Risk Adult  Completed      Plan:  I have personally reviewed and addressed the Medicare Annual Wellness questionnaire and have noted the following in the patient's chart:  A. Medical and social history B. Use of alcohol, tobacco or illicit drugs  C. Current medications and supplements D. Functional ability and status E.  Nutritional status F.  Physical activity G. Advance directives H. List of other physicians I.  Hospitalizations, surgeries, and ER visits in previous 12 months J.  [place] such as hearing and vision if needed, cognitive and depression L. Referrals and appointments - none  In addition, I have reviewed and discussed with patient certain preventive protocols, quality metrics, and best practice recommendations. A written personalized care plan for preventive services as well as general preventive health recommendations were provided to patient.  See attached scanned questionnaire for additional information.   Signed,  Fabio Neighbors, LPN Nurse Health Advisor   MD Recommendations: None.    I have reviewed the  health advisor's note, was available for consultation, and agree with documentation and plan  Lelon Huh, MD

## 2017-01-10 NOTE — Patient Instructions (Signed)
[name] , Thank you for taking time to come for your Medicare Wellness Visit. I appreciate your ongoing commitment to your health goals. Please review the following plan we discussed and let me know if I can assist you in the future.   Screening recommendations/referrals: Colonoscopy: last done 04/27/07, due 04/26/17 Mammogram: done 12/29/15 Bone Density: last done 05/07/16 Recommended yearly ophthalmology/optometry visit for glaucoma screening and checkup Recommended yearly dental visit for hygiene and checkup  Vaccinations: Influenza vaccine: done 07/12/16 Pneumococcal vaccine: completed series Tdap vaccine: last done 12/02/14 Shingles vaccine: declined    Advanced directives: declined  Next appointment: 01/17/17 @ 3:45 PM   Preventive Care 28 Years and Older, Female Preventive care refers to lifestyle choices and visits with your health care provider that can promote health and wellness. What does preventive care include?  A yearly physical exam. This is also called an annual well check.  Dental exams once or twice a year.  Routine eye exams. Ask your health care provider how often you should have your eyes checked.  Personal lifestyle choices, including:  Daily care of your teeth and gums.  Regular physical activity.  Eating a healthy diet.  Avoiding tobacco and drug use.  Limiting alcohol use.  Practicing safe sex.  Taking low-dose aspirin every day.  Taking vitamin and mineral supplements as recommended by your health care provider. What happens during an annual well check? The services and screenings done by your health care provider during your annual well check will depend on your age, overall health, lifestyle risk factors, and family history of disease. Counseling  Your health care provider may ask you questions about your:  Alcohol use.  Tobacco use.  Drug use.  Emotional well-being.  Home and relationship well-being.  Sexual activity.  Eating  habits.  History of falls.  Memory and ability to understand (cognition).  Work and work Statistician.  Reproductive health. Screening  You may have the following tests or measurements:  Height, weight, and BMI.  Blood pressure.  Lipid and cholesterol levels. These may be checked every 5 years, or more frequently if you are over 4 years old.  Skin check.  Lung cancer screening. You may have this screening every year starting at age 69 if you have a 30-pack-year history of smoking and currently smoke or have quit within the past 15 years.  Fecal occult blood test (FOBT) of the stool. You may have this test every year starting at age 72.  Flexible sigmoidoscopy or colonoscopy. You may have a sigmoidoscopy every 5 years or a colonoscopy every 10 years starting at age 65.  Hepatitis C blood test.  Hepatitis B blood test.  Sexually transmitted disease (STD) testing.  Diabetes screening. This is done by checking your blood sugar (glucose) after you have not eaten for a while (fasting). You may have this done every 1-3 years.  Bone density scan. This is done to screen for osteoporosis. You may have this done starting at age 59.  Mammogram. This may be done every 1-2 years. Talk to your health care provider about how often you should have regular mammograms. Talk with your health care provider about your test results, treatment options, and if necessary, the need for more tests. Vaccines  Your health care provider may recommend certain vaccines, such as:  Influenza vaccine. This is recommended every year.  Tetanus, diphtheria, and acellular pertussis (Tdap, Td) vaccine. You may need a Td booster every 10 years.  Zoster vaccine. You may need this  after age 81.  Pneumococcal 13-valent conjugate (PCV13) vaccine. One dose is recommended after age 55.  Pneumococcal polysaccharide (PPSV23) vaccine. One dose is recommended after age 8. Talk to your health care provider about which  screenings and vaccines you need and how often you need them. This information is not intended to replace advice given to you by your health care provider. Make sure you discuss any questions you have with your health care provider. Document Released: 10/13/2015 Document Revised: 06/05/2016 Document Reviewed: 07/18/2015 Elsevier Interactive Patient Education  2017 [place] Prevention in the Home Falls can cause injuries. They can happen to people of all ages. There are many things you can do to make your home safe and to help prevent falls. What can I do on the outside of my home?  Regularly fix the edges of walkways and driveways and fix any cracks.  Remove anything that might make you trip as you walk through a door, such as a raised step or threshold.  Trim any bushes or trees on the path to your home.  Use bright outdoor lighting.  Clear any walking paths of anything that might make someone trip, such as rocks or tools.  Regularly check to see if handrails are loose or broken. Make sure that both sides of any steps have handrails.  Any raised decks and porches should have guardrails on the edges.  Have any leaves, snow, or ice cleared regularly.  Use sand or salt on walking paths during winter.  Clean up any spills in your garage right away. This includes oil or grease spills. What can I do in the bathroom?  Use night lights.  Install grab bars by the toilet and in the tub and shower. Do not use towel bars as grab bars.  Use non-skid mats or decals in the tub or shower.  If you need to sit down in the shower, use a plastic, non-slip stool.  Keep the floor dry. Clean up any water that spills on the floor as soon as it happens.  Remove soap buildup in the tub or shower regularly.  Attach bath mats securely with double-sided non-slip rug tape.  Do not have throw rugs and other things on the floor that can make you trip. What can I do in the bedroom?  Use  night lights.  Make sure that you have a light by your bed that is easy to reach.  Do not use any sheets or blankets that are too big for your bed. They should not hang down onto the floor.  Have a firm chair that has side arms. You can use this for support while you get dressed.  Do not have throw rugs and other things on the floor that can make you trip. What can I do in the kitchen?  Clean up any spills right away.  Avoid walking on wet floors.  Keep items that you use a lot in easy-to-reach places.  If you need to reach something above you, use a strong step stool that has a grab bar.  Keep electrical cords out of the way.  Do not use floor polish or wax that makes floors slippery. If you must use wax, use non-skid floor wax.  Do not have throw rugs and other things on the floor that can make you trip. What can I do with my stairs?  Do not leave any items on the stairs.  Make sure that there are handrails on both sides of the  stairs and use them. Fix handrails that are broken or loose. Make sure that handrails are as long as the stairways.  Check any carpeting to make sure that it is firmly attached to the stairs. Fix any carpet that is loose or worn.  Avoid having throw rugs at the top or bottom of the stairs. If you do have throw rugs, attach them to the floor with carpet tape.  Make sure that you have a light switch at the top of the stairs and the bottom of the stairs. If you do not have them, ask someone to add them for you. What else can I do to help prevent falls?  Wear shoes that:  Do not have high heels.  Have rubber bottoms.  Are comfortable and fit you well.  Are closed at the toe. Do not wear sandals.  If you use a stepladder:  Make sure that it is fully opened. Do not climb a closed stepladder.  Make sure that both sides of the stepladder are locked into place.  Ask someone to hold it for you, if possible.  Clearly mark and make sure that you  can see:  Any grab bars or handrails.  First and last steps.  Where the edge of each step is.  Use tools that help you move around (mobility aids) if they are needed. These include:  Canes.  Walkers.  Scooters.  Crutches.  Turn on the lights when you go into a dark area. Replace any light bulbs as soon as they burn out.  Set up your furniture so you have a clear path. Avoid moving your furniture around.  If any of your floors are uneven, fix them.  If there are any pets around you, be aware of where they are.  Review your medicines with your doctor. Some medicines can make you feel dizzy. This can increase your chance of falling. Ask your doctor what other things that you can do to help prevent falls. This information is not intended to replace advice given to you by your health care provider. Make sure you discuss any questions you have with your health care provider. Document Released: 07/13/2009 Document Revised: 02/22/2016 Document Reviewed: 10/21/2014 Elsevier Interactive Patient Education  2017 [employer].

## 2017-01-10 NOTE — Progress Notes (Signed)
Patient: Kimberly [name], Female    DOB: 08-Jan-1946, 71 y.o.   MRN: [id number] Visit Date: 01/10/2017  Today's Provider: Lelon Huh, MD   Chief Complaint  Patient presents with  . Annual Exam  . Hyperlipidemia  . Back Pain   Subjective:    Annual physical exam Kimberly [name] is a 71 y.o. female who presents today for health maintenance and complete physical. She feels fairly well. She reports exercising rarely. She reports she is sleeping poorly.  -----------------------------------------------------------------   Review of Systems  Constitutional: Negative for chills, fatigue and fever.  HENT: Negative for congestion, ear pain, rhinorrhea, sneezing and sore throat.   Eyes: Negative.  Negative for pain and redness.  Respiratory: Negative for cough, shortness of breath and wheezing.   Cardiovascular: Negative for chest pain and leg swelling.  Gastrointestinal: Positive for abdominal pain and constipation. Negative for blood in stool, diarrhea and nausea.  Endocrine: Negative for polydipsia and polyphagia.  Genitourinary: Negative.  Negative for dysuria, flank pain, hematuria, pelvic pain, vaginal bleeding and vaginal discharge.  Musculoskeletal: Negative for arthralgias, back pain, gait problem and joint swelling.  Skin: Negative for rash.  Neurological: Negative.  Negative for dizziness, tremors, seizures, weakness, light-headedness, numbness and headaches.  Hematological: Negative for adenopathy.  Psychiatric/Behavioral: Negative.  Negative for behavioral problems, confusion and dysphoric mood. The patient is not nervous/anxious and is not hyperactive.     Social History      She  reports that she has been smoking Cigarettes.  She has been smoking about 0.75 packs per day. She has never used smokeless tobacco. She reports that she drinks alcohol. She reports that she does not use drugs.       Social History   Social History  . Marital status: Divorced    Spouse  name: N/A  . Number of children: N/A  . Years of education: N/A   Social History Main Topics  . Smoking status: Current Every Day Smoker    Packs/day: 0.75    Types: Cigarettes  . Smokeless tobacco: Never Used     Comment: started age 23 1/2 to 1 ppd  . Alcohol use 0.0 oz/week     Comment: occasionally drinks beer  . Drug use: No  . Sexual activity: Not on file   Other Topics Concern  . Not on file   Social History Narrative  . No narrative on file    Past Medical History:  Diagnosis Date  . Cancer (HCC)    breast  . Chronic back pain   . Chronic knee pain   . Degenerative disc disease, lumbar 04/2013  . Hyperlipidemia   . Neuropathy ([place])   . Osteoarthritis    of knee  . Osteoporosis   . Tobacco abuse   . Vitamin D deficiency      Patient Active Problem List   Diagnosis Date Noted  . Smoking greater than 30 pack years 01/10/2017  . Vitamin D deficiency 08/21/2016  . Hyperlipidemia 11/10/2015  . Chronic back pain 05/26/2015  . DDD (degenerative disc disease), lumbosacral 05/26/2015  . History of adenomatous polyp of colon 04/27/2007    Past Surgical History:  Procedure Laterality Date  . APPENDECTOMY  1965  . BREAST EXCISIONAL BIOPSY Left 08/01/2003   lumpectomy rad 11/04-2/28/2005  . CESAREAN SECTION     x3  . COLONOSCOPY W/ POLYPECTOMY    . HIP FRACTURE SURGERY Left 01/25/2012  . NECK SURGERY  12/2011  . WRIST  FRACTURE SURGERY      Family History        Family Status  Relation Status  . Mother Deceased  . Father Deceased at age 67   colon cancer  . Sister Alive  . Brother Alive  . Sister Alive  . Brother Alive  . Brother Alive        Her family history includes Asthma in her brother; Cancer in her father; Coronary artery disease in her mother; Deep vein thrombosis in her mother; Diabetes in her brother and mother.     Allergies  Allergen Reactions  . Iodine   . Sinus Formula [Cholestatin] Nausea Only     Current Outpatient  Prescriptions:  .  budesonide-formoterol (SYMBICORT) 80-4.5 MCG/ACT inhaler, Inhale 2 puffs into the lungs 2 (two) times daily., Disp: , Rfl:  .  Calcium Citrate-Vitamin D (CITRACAL/VITAMIN D PO), Take by mouth., Disp: , Rfl:  .  docusate sodium (COLACE) 100 MG capsule, Take 1 capsule (100 mg total) by mouth daily., Disp: 14 capsule, Rfl: 0 .  gabapentin (NEURONTIN) 300 MG capsule, Take 300 mg by mouth., Disp: , Rfl:  .  HYDROcodone-acetaminophen (NORCO) 7.5-325 MG tablet, Take 1-2 tablets by mouth every 6 (six) hours as needed for moderate pain., Disp: 120 tablet, Rfl: 0 .  meloxicam (MOBIC) 15 MG tablet, TAKE 1 TABLET EVERY DAY AS NEEDED (Patient not taking: Reported on 01/10/2017), Disp: 90 tablet, Rfl: 3 .  naproxen (NAPROSYN) 500 MG tablet, Take 500 mg by mouth 2 (two) times daily with a meal., Disp: , Rfl:  .  omeprazole (PRILOSEC) 20 MG capsule, TAKE 1 CAPSULE EVERY DAY, Disp: 90 capsule, Rfl: 4 .  pravastatin (PRAVACHOL) 40 MG tablet, TAKE 1 TABLET (40 MG TOTAL) BY MOUTH DAILY., Disp: 90 tablet, Rfl: 3 .  Probiotic Product (PROBIOTIC DAILY PO), Take by mouth., Disp: , Rfl:  .  Vitamin D, Ergocalciferol, (DRISDOL) 50000 units CAPS capsule, Take 1 capsule (50,000 Units total) by mouth every 7 (seven) days., Disp: 12 capsule, Rfl: 0   Patient Care Team: Birdie Sons, MD as PCP - General (Family Medicine)      Objective:   See [facility] note.   Physical Exam  . General Appearance:    Alert, cooperative, no distress, appears stated age  Head:    Normocephalic, without obvious abnormality, atraumatic  Eyes:    PERRL, conjunctiva/corneas clear, EOM's intact, fundi    benign, both eyes  Ears:    Normal TM's and external ear canals, both ears  Nose:   Nares normal, septum midline, mucosa normal, no drainage    or sinus tenderness  Throat:   Lips, mucosa, and tongue normal; teeth and gums normal  Neck:   Supple, symmetrical, trachea midline, no adenopathy;    thyroid:  no  enlargement/tenderness/nodules; no carotid   bruit or JVD  Back:     Symmetric, no curvature, ROM normal, no CVA tenderness  Lungs:     Clear to auscultation bilaterally, respirations unlabored  Chest Wall:    No tenderness or deformity   Heart:    Regular rate and rhythm, S1 and S2 normal, no murmur, rub   or gallop  Breast Exam:    normal appearance, no masses or tenderness  Abdomen:     Soft, non-tender, bowel sounds active all four quadrants,    no masses, no organomegaly  Pelvic:    deferred  Extremities:   Extremities normal, atraumatic, no cyanosis or edema  Pulses:   2+  and symmetric all extremities  Skin:   Skin color, texture, turgor normal, no rashes or lesions  Lymph nodes:   Cervical, supraclavicular, and axillary nodes normal  Neurologic:   CNII-XII intact, normal strength, sensation and reflexes    throughout    Depression Screen PHQ 2/9 Scores 01/10/2017 01/10/2017 07/12/2016  PHQ - 2 Score 0 0 0  PHQ- 9 Score 0 - -      Assessment & Plan:     Routine Health Maintenance and Physical Exam  Exercise Activities and Dietary recommendations Goals    . Decrease sugar intake          Recommend cutting down on sugar and sweets to eating 1 small portion a day versus 3 a day.       Immunization History  Administered Date(s) Administered  . Influenza, High Dose Seasonal PF 07/12/2016  . Influenza-Unspecified 08/30/2013  . Pneumococcal Conjugate-13 06/30/2014  . Pneumococcal Polysaccharide-23 03/02/2013  . Tdap 12/02/2014  . Zoster 12/02/2014    Health Maintenance  Topic Date Due  . INFLUENZA VACCINE  04/30/2017  . MAMMOGRAM  12/28/2017  . DEXA SCAN  05/08/2019  . COLONOSCOPY  01/05/2020  . TETANUS/TDAP  12/01/2024  . Hepatitis C Screening  Completed  . PNA vac Low Risk Adult  Completed     Discussed health benefits of physical activity, and encouraged her to engage in regular exercise appropriate for her age and condition.      --------------------------------------------------------------------   1. Annual physical exam She is to contact [facility] to schedule routine mammogram.   2. History of adenomatous polyp of colon Last colonoscopy April 2016  3. Vitamin D deficiency  - VITAMIN D 25 Hydroxy (Vit-D Deficiency, Fractures)  4. Hyperlipidemia, unspecified hyperlipidemia type She is tolerating pravastatin well with no adverse effects.   - Lipid panel  5. Constipation, unspecified constipation type Start daily metamucil - T4 AND TSH  6. Smoking greater than 30 pack years  - CT CHEST LUNG CANCER SCREENING LOW DOSE WO CONTRAST; Future   Lelon Huh, MD  [facility]

## 2017-01-10 NOTE — Progress Notes (Signed)
Patient: Kimberly [name] Female    DOB: May 15, 1946   71 y.o.   MRN: [id number] Visit Date: 01/10/2017  Today's Provider: Lelon Huh, MD   Chief Complaint  Patient presents with  . Follow-up  . Hyperlipidemia  . Back Pain   Subjective:    HPI    Lipid/Cholesterol, Follow-up:   Last seen for this 6 months ago.  Management since that visit includes; no changes.  Last Lipid Panel:    Component Value Date/Time   CHOL 202 (H) 07/12/2016 1429   TRIG 362 (H) 07/12/2016 1429   HDL 38 (L) 07/12/2016 1429   CHOLHDL 5.3 (H) 07/12/2016 1429   LDLCALC 92 07/12/2016 1429    She reports good compliance with treatment. She is not having side effects. none  Wt Readings from Last 3 Encounters:  01/01/17 172 lb (78 kg)  07/12/16 170 lb (77.1 kg)  04/05/16 174 lb (78.9 kg)    ----------------------------------------------------------------  Chronic back pain, unspecified back location, unspecified back pain laterality From 07/12/2016-no changes.  Vitamin D deficiency From 08/21/2016-restarted vitamin D 50,000 units weekly.  Allergies  Allergen Reactions  . Iodine   . Sinus Formula [Cholestatin] Nausea Only     Current Outpatient Prescriptions:  .  budesonide-formoterol (SYMBICORT) 80-4.5 MCG/ACT inhaler, Inhale 2 puffs into the lungs 2 (two) times daily., Disp: , Rfl:  .  Calcium Citrate-Vitamin D (CITRACAL/VITAMIN D PO), Take by mouth., Disp: , Rfl:  .  docusate sodium (COLACE) 100 MG capsule, Take 1 capsule (100 mg total) by mouth daily., Disp: 14 capsule, Rfl: 0 .  HYDROcodone-acetaminophen (NORCO) 7.5-325 MG tablet, Take 1-2 tablets by mouth every 6 (six) hours as needed for moderate pain., Disp: 120 tablet, Rfl: 0 .  meloxicam (MOBIC) 15 MG tablet, TAKE 1 TABLET EVERY DAY AS NEEDED, Disp: 90 tablet, Rfl: 3 .  naproxen (NAPROSYN) 500 MG tablet, Take 500 mg by mouth 2 (two) times daily with a meal., Disp: , Rfl:  .  omeprazole (PRILOSEC) 20 MG capsule, TAKE 1  CAPSULE EVERY DAY, Disp: 90 capsule, Rfl: 4 .  pravastatin (PRAVACHOL) 40 MG tablet, TAKE 1 TABLET (40 MG TOTAL) BY MOUTH DAILY., Disp: 90 tablet, Rfl: 3 .  Probiotic Product (PROBIOTIC DAILY PO), Take by mouth., Disp: , Rfl:  .  Vitamin D, Ergocalciferol, (DRISDOL) 50000 units CAPS capsule, Take 1 capsule (50,000 Units total) by mouth every 7 (seven) days., Disp: 12 capsule, Rfl: 0  Review of Systems  Constitutional: Negative for appetite change, chills, fatigue and fever.  Respiratory: Negative for chest tightness and shortness of breath.   Cardiovascular: Negative for chest pain and palpitations.  Gastrointestinal: Negative for abdominal pain, nausea and vomiting.  Neurological: Negative for dizziness and weakness.    Social History  Substance Use Topics  . Smoking status: Current Every Day Smoker    Packs/day: 0.75    Types: Cigarettes  . Smokeless tobacco: Never Used  . Alcohol use 0.0 oz/week     Comment: occasionally drinks beer   Objective:     Vital Signs - Last Recorded  BP    144/74 (BP Location: Right Arm)     Pulse  84     Temp  98.8 F (37.1 C) (Oral)     Ht  5\' 4"  (1.626 m)     Wt  169 lb 12.8 oz (77 kg)      BMI  29.15 kg/m      Vitals History  BMI and  BSA Data   Body Mass Index: 29.15 kg/m Body Surface Area: 1.86 m      Physical Exam      Assessment & Plan:           Lelon Huh, MD  [facility]

## 2017-01-10 NOTE — Patient Instructions (Signed)
Start taking one heaping spoonful of metamucil mixed with liquid drink every night before bed.

## 2017-01-11 LAB — LIPID PANEL
CHOL/HDL RATIO: 6.9 ratio — AB (ref 0.0–4.4)
Cholesterol, Total: 243 mg/dL — ABNORMAL HIGH (ref 100–199)
HDL: 35 mg/dL — AB (ref >39)
LDL CALC: 128 mg/dL — AB (ref 0–99)
Triglycerides: 399 mg/dL — ABNORMAL HIGH (ref 0–149)
VLDL Cholesterol Cal: 80 mg/dL — ABNORMAL HIGH (ref 5–40)

## 2017-01-11 LAB — T4 AND TSH
T4 TOTAL: 8.9 ug/dL (ref 4.5–12.0)
TSH: 2.6 u[IU]/mL (ref 0.450–4.500)

## 2017-01-11 LAB — HEPATITIS C ANTIBODY: Hep C Virus Ab: <0.1 s/co ratio (ref 0.0–0.9)

## 2017-01-11 LAB — VITAMIN D 25 HYDROXY (VIT D DEFICIENCY, FRACTURES): Vit D, 25-Hydroxy: 15.7 ng/mL — ABNORMAL LOW (ref 30.0–100.0)

## 2017-01-13 NOTE — Progress Notes (Signed)
Advised  ED 

## 2017-01-14 ENCOUNTER — Other Ambulatory Visit: Payer: Self-pay | Admitting: *Deleted

## 2017-01-14 DIAGNOSIS — M5136 Other intervertebral disc degeneration, lumbar region: Secondary | ICD-10-CM

## 2017-01-14 MED ORDER — HYDROCODONE-ACETAMINOPHEN 7.5-325 MG PO TABS: 1.0000 | ORAL_TABLET | Freq: Four times a day (QID) | ORAL | 0 refills | Status: DC | PRN

## 2017-01-15 ENCOUNTER — Telehealth: Payer: Self-pay | Admitting: *Deleted

## 2017-01-15 DIAGNOSIS — Z87891 Personal history of nicotine dependence: Secondary | ICD-10-CM

## 2017-01-15 NOTE — Telephone Encounter (Signed)
Received referral for initial lung cancer screening scan. Contacted patient and obtained smoking history,(current, 55 pack year) as well as answering questions related to screening process. Patient denies signs of lung cancer such as weight loss or hemoptysis. Patient denies comorbidity that would prevent curative treatment if lung cancer were found. Patient is tentatively scheduled for shared decision making visit and CT scan on 01/21/17, pending insurance approval from business office.

## 2017-01-17 ENCOUNTER — Ambulatory Visit: Payer: Medicare HMO | Admitting: Family Medicine

## 2017-01-21 ENCOUNTER — Encounter: Payer: Self-pay | Admitting: Oncology

## 2017-01-21 ENCOUNTER — Inpatient Hospital Stay: Payer: Medicare HMO | Attending: Oncology | Admitting: Oncology

## 2017-01-21 ENCOUNTER — Ambulatory Visit
Admission: RE | Admit: 2017-01-21 | Discharge: 2017-01-21 | Disposition: A | Discharge status: Home / Self Care | Payer: Medicare HMO | Source: Ambulatory Visit | Attending: Oncology | Admitting: Oncology

## 2017-01-21 DIAGNOSIS — Z122 Encounter for screening for malignant neoplasm of respiratory organs: Secondary | ICD-10-CM | POA: Diagnosis not present

## 2017-01-21 DIAGNOSIS — D3501 Benign neoplasm of right adrenal gland: Secondary | ICD-10-CM | POA: Insufficient documentation

## 2017-01-21 DIAGNOSIS — F17210 Nicotine dependence, cigarettes, uncomplicated: Secondary | ICD-10-CM

## 2017-01-21 DIAGNOSIS — Z87891 Personal history of nicotine dependence: Secondary | ICD-10-CM

## 2017-01-21 DIAGNOSIS — D3502 Benign neoplasm of left adrenal gland: Secondary | ICD-10-CM | POA: Insufficient documentation

## 2017-01-21 NOTE — Assessment & Plan Note (Signed)
In accordance with CMS guidelines, patient has met eligibility criteria including age, absence of signs or symptoms of lung cancer.  Social History  Substance Use Topics  . Smoking status: Current Every Day Smoker    Packs/day: 1.00    Years: 55.00    Types: Cigarettes  . Smokeless tobacco: Never Used     Comment: started age 71 1/2 to 1 ppd  . Alcohol use 0.0 oz/week     Comment: occasionally drinks beer     A shared decision-making session was conducted prior to the performance of CT scan. This includes one or more decision aids, includes benefits and harms of screening, follow-up diagnostic testing, over-diagnosis, false positive rate, and total radiation exposure.  Counseling on the importance of adherence to annual lung cancer LDCT screening, impact of co-morbidities, and ability or willingness to undergo diagnosis and treatment is imperative for compliance of the program.  Counseling on the importance of continued smoking cessation for former smokers; the importance of smoking cessation for current smokers, and information about tobacco cessation interventions have been given to patient including [place] and [facility] quit [place] programs.  Written order for lung cancer screening with LDCT has been given to the patient and any and all questions have been answered to the best of my abilities.   Yearly follow up will be coordinated by Burgess Estelle, Thoracic Navigator.

## 2017-01-21 NOTE — Progress Notes (Signed)
Personal history of tobacco use, presenting hazards to health In accordance with CMS guidelines, patient has met eligibility criteria including age, absence of signs or symptoms of lung cancer.  Social History  Substance Use Topics  . Smoking status: Current Every Day Smoker    Packs/day: 1.00    Years: 55.00    Types: Cigarettes  . Smokeless tobacco: Never Used     Comment: started age 71 1/2 to 1 ppd  . Alcohol use 0.0 oz/week     Comment: occasionally drinks beer     A shared decision-making session was conducted prior to the performance of CT scan. This includes one or more decision aids, includes benefits and harms of screening, follow-up diagnostic testing, over-diagnosis, false positive rate, and total radiation exposure.  Counseling on the importance of adherence to annual lung cancer LDCT screening, impact of co-morbidities, and ability or willingness to undergo diagnosis and treatment is imperative for compliance of the program.  Counseling on the importance of continued smoking cessation for former smokers; the importance of smoking cessation for current smokers, and information about tobacco cessation interventions have been given to patient including [place] and [facility] quit [place] programs.  Written order for lung cancer screening with LDCT has been given to the patient and any and all questions have been answered to the best of my abilities.   Yearly follow up will be coordinated by Burgess Estelle, Thoracic Navigator.   Lucendia Herrlich, NP  01/21/17 1:50 PM

## 2017-01-24 ENCOUNTER — Encounter: Payer: Self-pay | Admitting: *Deleted

## 2017-01-29 ENCOUNTER — Encounter: Payer: Self-pay | Admitting: Family Medicine

## 2017-01-29 DIAGNOSIS — I2510 Atherosclerotic heart disease of native coronary artery without angina pectoris: Secondary | ICD-10-CM | POA: Insufficient documentation

## 2017-02-03 ENCOUNTER — Ambulatory Visit (INDEPENDENT_AMBULATORY_CARE_PROVIDER_SITE_OTHER): Payer: Medicare HMO | Admitting: Family Medicine

## 2017-02-03 ENCOUNTER — Encounter: Payer: Self-pay | Admitting: Family Medicine

## 2017-02-03 VITALS — BP 122/70 | HR 82 | Temp 98.7°F | Resp 18 | Wt 166.0 lb

## 2017-02-03 DIAGNOSIS — R1011 Right upper quadrant pain: Secondary | ICD-10-CM | POA: Diagnosis not present

## 2017-02-03 MED ORDER — OXYCODONE-ACETAMINOPHEN 7.5-325 MG PO TABS: 1.0000 | ORAL_TABLET | ORAL | 0 refills | Status: DC | PRN

## 2017-02-03 NOTE — Progress Notes (Addendum)
Patient: Kimberly [name] Female    DOB: 1946-09-25   71 y.o.   MRN: [id number] Visit Date: 02/03/2017  Today's Provider: Lelon Huh, MD   Chief Complaint  Patient presents with  . Abdominal Pain   Subjective:    Abdominal Pain  This is a new problem. Episode onset: 2 months. The problem has been gradually worsening. The pain is located in the RUQ and epigastric region. The quality of the pain is sharp. Associated symptoms include belching and flatus. Pertinent negatives include no constipation, diarrhea, dysuria, fever, hematochezia, hematuria, melena, myalgias, nausea or vomiting. The pain is aggravated by eating (also drinking and walking). The pain is relieved by nothing. Treatments tried: Gas X and fiber. The treatment provided no relief.  Patient states this past weekend she felt a knot in the upper right quadrant of her abdomen.   She was initially seen for this by Adriana on 01/01/2017 and was thought to be secondary to to constipation related opioid use and was started on docusate. We had her add daily metamucil when she was here for her CPE on 01/10/2017. She states she is no longer constipated, but pain continues to get progressive worse. It is now nearly constant, and worsens after eating.     Allergies  Allergen Reactions  . Iodine   . Sinus Formula [Cholestatin] Nausea Only     Current Outpatient Prescriptions:  .  budesonide-formoterol (SYMBICORT) 80-4.5 MCG/ACT inhaler, Inhale 2 puffs into the lungs 2 (two) times daily., Disp: , Rfl:  .  Calcium Citrate-Vitamin D (CITRACAL/VITAMIN D PO), Take by mouth., Disp: , Rfl:  .  docusate sodium (COLACE) 100 MG capsule, Take 1 capsule (100 mg total) by mouth daily., Disp: 14 capsule, Rfl: 0 .  gabapentin (NEURONTIN) 300 MG capsule, Take 300 mg by mouth., Disp: , Rfl:  .  HYDROcodone-acetaminophen (NORCO) 7.5-325 MG tablet, Take 1-2 tablets by mouth every 6 (six) hours as needed for moderate pain., Disp: 120 tablet, Rfl: 0 .   meloxicam (MOBIC) 15 MG tablet, TAKE 1 TABLET EVERY DAY AS NEEDED, Disp: 90 tablet, Rfl: 3 .  naproxen (NAPROSYN) 500 MG tablet, Take 500 mg by mouth 2 (two) times daily with a meal., Disp: , Rfl:  .  omeprazole (PRILOSEC) 20 MG capsule, TAKE 1 CAPSULE EVERY DAY, Disp: 90 capsule, Rfl: 4 .  pravastatin (PRAVACHOL) 40 MG tablet, TAKE 1 TABLET (40 MG TOTAL) BY MOUTH DAILY., Disp: 90 tablet, Rfl: 3 .  Probiotic Product (PROBIOTIC DAILY PO), Take by mouth., Disp: , Rfl:  .  Vitamin D, Ergocalciferol, (DRISDOL) 50000 units CAPS capsule, Take 1 capsule (50,000 Units total) by mouth every 7 (seven) days., Disp: 12 capsule, Rfl: 0  Review of Systems  Constitutional: Negative for appetite change, chills, fatigue and fever.  Respiratory: Negative for chest tightness and shortness of breath.   Cardiovascular: Negative for chest pain and palpitations.  Gastrointestinal: Positive for abdominal distention, abdominal pain and flatus. Negative for constipation, diarrhea, hematochezia, melena, nausea and vomiting.       Belching and flatulence  Genitourinary: Negative for dysuria and hematuria.  Musculoskeletal: Negative for myalgias.  Neurological: Negative for dizziness and weakness.    Social History  Substance Use Topics  . Smoking status: Current Every Day Smoker    Packs/day: 0.50    Years: 55.00    Types: Cigarettes  . Smokeless tobacco: Never Used     Comment: started age 56 1/2 to 1 ppd  . Alcohol  use 0.0 oz/week     Comment: occasionally drinks beer   Objective:   BP 122/70 (BP Location: Left Arm, Patient Position: Sitting, Cuff Size: Normal)   Pulse 82   Temp 98.7 F (37.1 C) (Oral)   Resp 18   Wt 166 lb (75.3 kg)   SpO2 94% Comment: room air  BMI 28.49 kg/m  There were no vitals filed for this visit.   Physical Exam  General Appearance:    Alert, cooperative, no distress  Eyes:    PERRL, conjunctiva/corneas clear, EOM's intact       Lungs:     Clear to auscultation  bilaterally, respirations unlabored  Heart:    Regular rate and rhythm  Abdomen:   bowel sounds present and normal in all 4 quadrants, soft or round. No CVA tenderness. Moderated RUQ tenderness, no masses.         Assessment & Plan:     1. Right upper quadrant pain Counseled on bland and low fat diet.  - US Abdomen Limited RUQ; Future       Lelon Huh, MD  [facility]

## 2017-02-05 ENCOUNTER — Telehealth: Payer: Self-pay | Admitting: Family Medicine

## 2017-02-05 NOTE — Telephone Encounter (Signed)
Please advise 

## 2017-02-05 NOTE — Telephone Encounter (Signed)
Judson Roch is working on it now.

## 2017-02-05 NOTE — Telephone Encounter (Signed)
Pt states she is suppose to have a x-ray of her stomach.  Please advise.  CB#[phone]/MW

## 2017-02-05 NOTE — Addendum Note (Signed)
Addended by: Lelon Huh E on: 02/05/2017 05:30 PM   Modules accepted: Orders

## 2017-02-06 ENCOUNTER — Ambulatory Visit
Admission: RE | Admit: 2017-02-06 | Discharge: 2017-02-06 | Disposition: A | Discharge status: Home / Self Care | Payer: Medicare HMO | Source: Ambulatory Visit | Attending: Family Medicine | Admitting: Family Medicine

## 2017-02-06 ENCOUNTER — Other Ambulatory Visit: Payer: Self-pay | Admitting: Family Medicine

## 2017-02-06 DIAGNOSIS — R1011 Right upper quadrant pain: Secondary | ICD-10-CM | POA: Diagnosis not present

## 2017-02-06 DIAGNOSIS — R1084 Generalized abdominal pain: Secondary | ICD-10-CM

## 2017-02-07 ENCOUNTER — Other Ambulatory Visit: Payer: Self-pay | Admitting: *Deleted

## 2017-02-07 ENCOUNTER — Telehealth: Payer: Self-pay | Admitting: Emergency Medicine

## 2017-02-07 DIAGNOSIS — R1011 Right upper quadrant pain: Secondary | ICD-10-CM

## 2017-02-07 NOTE — Telephone Encounter (Signed)
Pt requesting results from imaging yesterday. Please advise.

## 2017-02-07 NOTE — Telephone Encounter (Signed)
Please advise results? 

## 2017-02-07 NOTE — Progress Notes (Unsigned)
Please schedule referral to GI? Thanks! 

## 2017-02-10 NOTE — Telephone Encounter (Signed)
See result note.  

## 2017-02-11 ENCOUNTER — Encounter: Payer: Self-pay | Admitting: Gastroenterology

## 2017-02-11 ENCOUNTER — Other Ambulatory Visit: Payer: Self-pay

## 2017-02-11 ENCOUNTER — Ambulatory Visit (INDEPENDENT_AMBULATORY_CARE_PROVIDER_SITE_OTHER): Payer: Medicare HMO | Admitting: Gastroenterology

## 2017-02-11 ENCOUNTER — Telehealth: Payer: Self-pay

## 2017-02-11 VITALS — BP 128/74 | HR 93 | Temp 98.4°F | Ht 63.0 in | Wt 165.4 lb

## 2017-02-11 DIAGNOSIS — R1011 Right upper quadrant pain: Secondary | ICD-10-CM

## 2017-02-11 DIAGNOSIS — Z1239 Encounter for other screening for malignant neoplasm of breast: Secondary | ICD-10-CM | POA: Insufficient documentation

## 2017-02-11 DIAGNOSIS — IMO0002 Reserved for concepts with insufficient information to code with codable children: Secondary | ICD-10-CM | POA: Insufficient documentation

## 2017-02-11 DIAGNOSIS — M810 Age-related osteoporosis without current pathological fracture: Secondary | ICD-10-CM | POA: Insufficient documentation

## 2017-02-11 DIAGNOSIS — J40 Bronchitis, not specified as acute or chronic: Secondary | ICD-10-CM | POA: Insufficient documentation

## 2017-02-11 DIAGNOSIS — B9689 Other specified bacterial agents as the cause of diseases classified elsewhere: Secondary | ICD-10-CM | POA: Insufficient documentation

## 2017-02-11 DIAGNOSIS — M25559 Pain in unspecified hip: Secondary | ICD-10-CM | POA: Insufficient documentation

## 2017-02-11 DIAGNOSIS — L989 Disorder of the skin and subcutaneous tissue, unspecified: Secondary | ICD-10-CM | POA: Insufficient documentation

## 2017-02-11 DIAGNOSIS — M7070 Other bursitis of hip, unspecified hip: Secondary | ICD-10-CM | POA: Insufficient documentation

## 2017-02-11 DIAGNOSIS — Z23 Encounter for immunization: Secondary | ICD-10-CM | POA: Insufficient documentation

## 2017-02-11 DIAGNOSIS — G629 Polyneuropathy, unspecified: Secondary | ICD-10-CM | POA: Insufficient documentation

## 2017-02-11 DIAGNOSIS — M1710 Unilateral primary osteoarthritis, unspecified knee: Secondary | ICD-10-CM | POA: Insufficient documentation

## 2017-02-11 DIAGNOSIS — R609 Edema, unspecified: Secondary | ICD-10-CM | POA: Insufficient documentation

## 2017-02-11 DIAGNOSIS — Z800 Family history of malignant neoplasm of digestive organs: Secondary | ICD-10-CM | POA: Insufficient documentation

## 2017-02-11 DIAGNOSIS — N63 Unspecified lump in unspecified breast: Secondary | ICD-10-CM | POA: Insufficient documentation

## 2017-02-11 DIAGNOSIS — H109 Unspecified conjunctivitis: Secondary | ICD-10-CM | POA: Insufficient documentation

## 2017-02-11 DIAGNOSIS — R935 Abnormal findings on diagnostic imaging of other abdominal regions, including retroperitoneum: Secondary | ICD-10-CM | POA: Insufficient documentation

## 2017-02-11 DIAGNOSIS — L304 Erythema intertrigo: Secondary | ICD-10-CM | POA: Insufficient documentation

## 2017-02-11 DIAGNOSIS — M9979 Connective tissue and disc stenosis of intervertebral foramina of abdomen and other regions: Secondary | ICD-10-CM | POA: Insufficient documentation

## 2017-02-11 DIAGNOSIS — M79673 Pain in unspecified foot: Secondary | ICD-10-CM | POA: Insufficient documentation

## 2017-02-11 DIAGNOSIS — Z853 Personal history of malignant neoplasm of breast: Secondary | ICD-10-CM | POA: Insufficient documentation

## 2017-02-11 DIAGNOSIS — J019 Acute sinusitis, unspecified: Secondary | ICD-10-CM

## 2017-02-11 NOTE — Progress Notes (Signed)
Primary Care Physician: Birdie Sons, MD  Primary Gastroenterologist:  Dr. [name]  Chief Complaint  Patient presents with  . Abdominal Pain    times 2 months    HPI: Kimberly [name] is a 71 y.o. female here for right-sided abdominal pain. The patient reports of the right side abdominal pain is a sharp pain that comes and goes. She reports that it is made worse with anything she eats or drinks including water. The patient also reports that she has excessive amounts of gas. The patient denies intake of any dairy products. The patient has been taking pain medication for this pain. She denies anything relieving the pain. There is no report of any constipation or diarrhea. The patient had a colonoscopy by me 2 years ago. She reports that she has lost approximately 10-15 pounds in the last 2 months because the pain. The patient had a right upper quadrant ultrasound that did not show any cause for her abdominal pain. The patient's labs were also unremarkable except for her triglycerides and cholesterol which were high.  Current Outpatient Prescriptions  Medication Sig Dispense Refill  . budesonide-formoterol (SYMBICORT) 80-4.5 MCG/ACT inhaler Inhale 2 puffs into the lungs 2 (two) times daily.    . Calcium Citrate-Vitamin D (CITRACAL/VITAMIN D PO) Take by mouth.    . docusate sodium (COLACE) 100 MG capsule Take 1 capsule (100 mg total) by mouth daily. 14 capsule 0  . gabapentin (NEURONTIN) 400 MG capsule     . naproxen (NAPROSYN) 500 MG tablet Take 500 mg by mouth 2 (two) times daily with a meal.    . omeprazole (PRILOSEC) 20 MG capsule TAKE 1 CAPSULE EVERY DAY 90 capsule 4  . oxyCODONE-acetaminophen (PERCOCET) 7.5-325 MG tablet Take 1 tablet by mouth every 4 (four) hours as needed for severe pain. 20 tablet 0  . pravastatin (PRAVACHOL) 40 MG tablet TAKE 1 TABLET (40 MG TOTAL) BY MOUTH DAILY. 90 tablet 3  . Probiotic Product (PROBIOTIC DAILY PO) Take by mouth.    . Vitamin D,  Ergocalciferol, (DRISDOL) 50000 units CAPS capsule Take 1 capsule (50,000 Units total) by mouth every 7 (seven) days. 12 capsule 0  . gabapentin (NEURONTIN) 300 MG capsule Take 300 mg by mouth.    Marland Kitchen HYDROcodone-acetaminophen (NORCO) 7.5-325 MG tablet Take 1-2 tablets by mouth every 6 (six) hours as needed for moderate pain. (Patient not taking: Reported on 02/11/2017) 120 tablet 0  . meloxicam (MOBIC) 15 MG tablet TAKE 1 TABLET EVERY DAY AS NEEDED (Patient not taking: Reported on 02/11/2017) 90 tablet 3   No current facility-administered medications for this visit.     Allergies as of 02/11/2017 - Review Complete 02/11/2017  Allergen Reaction Noted  . Iodine  04/13/2015  . Sinus formula [cholestatin] Nausea Only 04/13/2015    ROS:  General: Negative for anorexia, weight loss, fever, chills, fatigue, weakness. ENT: Negative for hoarseness, difficulty swallowing , nasal congestion. CV: Negative for chest pain, angina, palpitations, dyspnea on exertion, peripheral edema.  Respiratory: Negative for dyspnea at rest, dyspnea on exertion, cough, sputum, wheezing.  GI: See history of present illness. GU:  Negative for dysuria, hematuria, urinary incontinence, urinary frequency, nocturnal urination.  Endo: Negative for unusual weight change.    Physical Examination:   BP 128/74   Pulse 93   Temp 98.4 F (36.9 C) (Oral)   Ht 5\' 3"  (1.6 m)   Wt 165 lb 6.4 oz (75 kg)   BMI 29.30 kg/m   General: Well-nourished,  well-developed in no acute distress.  Eyes: No icterus. Conjunctivae pink. Mouth: Oropharyngeal mucosa moist and pink , no lesions erythema or exudate. Lungs: Clear to auscultation bilaterally. Non-labored. Heart: Regular rate and rhythm, no murmurs rubs or gallops.  Abdomen: Bowel sounds are normal,right upper quadrant tenderness NOT consistent with musculoskeletal pain  nondistended, no hepatosplenomegaly or masses, no abdominal bruits or hernia , no rebound or guarding.     Extremities: No lower extremity edema. No clubbing or deformities. Neuro: Alert and oriented x 3.  Grossly intact. Skin: Warm and dry, no jaundice.   Psych: Alert and cooperative, normal mood and affect.  Labs:    Imaging Studies: Ct Chest Lung Cancer Screening Low Dose Wo Contrast  Result Date: 01/21/2017 CLINICAL DATA:  71 year old female current smoker, with 55 pack-year history of smoking, for initial lung cancer screening. Left breast cancer with history of left breast lumpectomy and radiation therapy. EXAM: CT CHEST WITHOUT CONTRAST LOW-DOSE FOR LUNG CANCER SCREENING TECHNIQUE: Multidetector CT imaging of the chest was performed following the standard protocol without IV contrast. COMPARISON:  None. FINDINGS: Cardiovascular: The heart is normal in size. No pericardial effusion. Very mild coronary atherosclerosis the LAD. Mild atherosclerotic calcifications of the aortic arch. Mediastinum/Nodes: No suspicious mediastinal lymphadenopathy. Visualized thyroid is unremarkable. Lungs/Pleura: Small bilateral pulmonary nodules, including a 5.1 mm subpleural nodule in the right middle lobe. Mild paraseptal emphysematous changes, upper lobe predominant. No focal consolidation. Subpleural reticulation/ fibrosis in the left upper lobe, likely reflecting radiation changes. No pleural effusion or pneumothorax. Upper Abdomen: Visualized upper abdomen is notable for bilateral adrenal adenomas, measuring 2.3 cm on the right and 5.9 cm on the left. Musculoskeletal: Status post left breast lumpectomy. Mild degenerative changes of the visualized thoracolumbar spine. IMPRESSION: Lung-RADS Category 2, benign appearance or behavior. Continue annual screening with low-dose chest CT without contrast in 12 months. Bilateral adrenal adenomas, benign. Electronically Signed   By: Julian Hy M.D.   On: 01/21/2017 17:06   US Abdomen Limited Ruq  Result Date: 02/06/2017 CLINICAL DATA:  Right upper quadrant pain EXAM:  US ABDOMEN LIMITED - RIGHT UPPER QUADRANT COMPARISON:  None. FINDINGS: Gallbladder: No gallstones or wall thickening visualized. No sonographic Murphy sign noted by sonographer. Common bile duct: Diameter: 5.8 mm. Liver: No focal lesion identified. Within normal limits in parenchymal echogenicity. IMPRESSION: No acute abnormality noted. Electronically Signed   By: Inez Catalina M.D.   On: 02/06/2017 08:54    Assessment and Plan:   Kimberly [name] is a 71 y.o. y/o female who comes in with right sided abdominal pain that does not appear to be musculoskeletal. The patient has lost approximately 10 pounds in the last 2 months that the patient has been here. The patient reports that the pain has been associated with increased gas. The patient's ultrasound and blood work was negative. The patient will be set up for a HIDA scan with [facility] to see if she may have a hypofunctioning gallbladder. If that does not show any findings to explain her symptoms she may need to be started on a antispasmodic to see if that helps. The patient has been explained the plan and agrees with it   [name], MD. [facility]   Note: This dictation was prepared with Dragon dictation along with smaller phrase technology. Any transcriptional errors that result from this process are unintentional.

## 2017-02-11 NOTE — Telephone Encounter (Signed)
HIDAA Scan with [facility] 02/21/17 9:30 am pt has been informed of date and instructions nothing to eat or drink after midnight.

## 2017-02-13 ENCOUNTER — Telehealth: Payer: Self-pay | Admitting: Gastroenterology

## 2017-02-13 NOTE — Telephone Encounter (Signed)
The patient know that I have no problem with her taking laxatives when she needs them.

## 2017-02-13 NOTE — Telephone Encounter (Signed)
Patient left a voice message that she was still feeling bad and decided to take a laxative. She just wanted Dr. Allen Norris to know.

## 2017-02-17 ENCOUNTER — Other Ambulatory Visit: Payer: Self-pay | Admitting: *Deleted

## 2017-02-17 ENCOUNTER — Other Ambulatory Visit: Payer: Self-pay | Admitting: Family Medicine

## 2017-02-17 DIAGNOSIS — E785 Hyperlipidemia, unspecified: Secondary | ICD-10-CM

## 2017-02-17 DIAGNOSIS — M5136 Other intervertebral disc degeneration, lumbar region: Secondary | ICD-10-CM

## 2017-02-17 MED ORDER — HYDROCODONE-ACETAMINOPHEN 7.5-325 MG PO TABS: 1.0000 | ORAL_TABLET | Freq: Four times a day (QID) | ORAL | 0 refills | Status: DC | PRN

## 2017-02-17 NOTE — Telephone Encounter (Signed)
Pt has been informed that you did not have a problem with taking laxative if she needed it.

## 2017-02-21 ENCOUNTER — Ambulatory Visit
Admission: RE | Admit: 2017-02-21 | Discharge: 2017-02-21 | Disposition: A | Discharge status: Home / Self Care | Payer: Medicare HMO | Source: Ambulatory Visit | Attending: Gastroenterology | Admitting: Gastroenterology

## 2017-02-21 DIAGNOSIS — R1011 Right upper quadrant pain: Secondary | ICD-10-CM | POA: Diagnosis not present

## 2017-02-21 DIAGNOSIS — K8020 Calculus of gallbladder without cholecystitis without obstruction: Secondary | ICD-10-CM | POA: Diagnosis not present

## 2017-02-21 MED ORDER — TECHNETIUM TC 99M MEBROFENIN IV KIT
5.4400 | PACK | Freq: Once | INTRAVENOUS | Status: AC | PRN
  Administered 2017-02-21: 5.44 via INTRAVENOUS

## 2017-02-28 ENCOUNTER — Telehealth: Payer: Self-pay | Admitting: Gastroenterology

## 2017-02-28 NOTE — Telephone Encounter (Signed)
results

## 2017-03-06 ENCOUNTER — Telehealth: Payer: Self-pay

## 2017-03-06 NOTE — Telephone Encounter (Signed)
-----   Message from [name], MD sent at 02/25/2017 11:29 AM EDT ----- Have this patient follow up in the office for evaluation of further workup after her gallbladder emptying study.

## 2017-03-06 NOTE — Telephone Encounter (Signed)
Pt scheduled a follow up appt with [facility] to discuss results.

## 2017-03-13 ENCOUNTER — Ambulatory Visit: Payer: Medicare HMO | Admitting: Gastroenterology

## 2017-03-20 ENCOUNTER — Other Ambulatory Visit: Payer: Self-pay | Admitting: Family Medicine

## 2017-03-20 DIAGNOSIS — M5136 Other intervertebral disc degeneration, lumbar region: Secondary | ICD-10-CM

## 2017-03-20 MED ORDER — HYDROCODONE-ACETAMINOPHEN 7.5-325 MG PO TABS: 1.0000 | ORAL_TABLET | Freq: Four times a day (QID) | ORAL | 0 refills | Status: DC | PRN

## 2017-04-01 ENCOUNTER — Encounter: Payer: Self-pay | Admitting: Gastroenterology

## 2017-04-01 ENCOUNTER — Other Ambulatory Visit: Payer: Self-pay

## 2017-04-01 ENCOUNTER — Ambulatory Visit (INDEPENDENT_AMBULATORY_CARE_PROVIDER_SITE_OTHER): Payer: Medicare HMO | Admitting: Gastroenterology

## 2017-04-01 VITALS — BP 131/75 | HR 109 | Temp 98.3°F | Ht 63.0 in | Wt 150.2 lb

## 2017-04-01 DIAGNOSIS — R0689 Other abnormalities of breathing: Secondary | ICD-10-CM | POA: Insufficient documentation

## 2017-04-01 DIAGNOSIS — R32 Unspecified urinary incontinence: Secondary | ICD-10-CM | POA: Insufficient documentation

## 2017-04-01 DIAGNOSIS — R1084 Generalized abdominal pain: Secondary | ICD-10-CM

## 2017-04-01 DIAGNOSIS — R109 Unspecified abdominal pain: Secondary | ICD-10-CM

## 2017-04-01 DIAGNOSIS — R062 Wheezing: Secondary | ICD-10-CM | POA: Insufficient documentation

## 2017-04-01 NOTE — Progress Notes (Signed)
Primary Care Physician: Birdie Sons, MD  Primary Gastroenterologist:  Dr. [name]  Chief Complaint  Patient presents with  . Follow up HIDA scan results    HPI: Kimberly [name] is a 71 y.o. female here for follow-up after seeing me for what appeared to be right upper quadrant pain.  The patient now states that her abdominal pain is in the mid abdomen and lower abdomen.  The patient had a skin was shown to have a normal ejection fraction but she did have symptoms after drinking the ensure.  She reports that she has lost more weight and on investigation it appears that she has lost another 15 pounds since last seeing me.  She had a colonoscopy 2 years ago without any findings to explain her weight loss and abdominal pain.  Current Outpatient Prescriptions  Medication Sig Dispense Refill  . omeprazole (PRILOSEC) 20 MG capsule TAKE 1 CAPSULE EVERY DAY 90 capsule 4  . Probiotic Product (PROBIOTIC DAILY PO) Take by mouth.    . budesonide-formoterol (SYMBICORT) 80-4.5 MCG/ACT inhaler Inhale 2 puffs into the lungs 2 (two) times daily.    . Calcium Citrate-Vitamin D (CITRACAL/VITAMIN D PO) Take by mouth.    . docusate sodium (COLACE) 100 MG capsule Take 1 capsule (100 mg total) by mouth daily. (Patient not taking: Reported on 04/01/2017) 14 capsule 0  . gabapentin (NEURONTIN) 300 MG capsule Take 300 mg by mouth.    . gabapentin (NEURONTIN) 400 MG capsule     . HYDROcodone-acetaminophen (NORCO) 7.5-325 MG tablet Take 1-2 tablets by mouth every 6 (six) hours as needed for moderate pain. (Patient not taking: Reported on 04/01/2017) 120 tablet 0  . meloxicam (MOBIC) 15 MG tablet TAKE 1 TABLET EVERY DAY AS NEEDED (Patient not taking: Reported on 02/11/2017) 90 tablet 3  . naproxen (NAPROSYN) 500 MG tablet Take 500 mg by mouth 2 (two) times daily with a meal.    . oxyCODONE-acetaminophen (PERCOCET) 7.5-325 MG tablet Take 1 tablet by mouth every 4 (four) hours as needed for severe pain. (Patient not  taking: Reported on 04/01/2017) 20 tablet 0  . pravastatin (PRAVACHOL) 40 MG tablet TAKE 1 TABLET (40 MG TOTAL) BY MOUTH DAILY. (Patient not taking: Reported on 04/01/2017) 90 tablet 4  . Vitamin D, Ergocalciferol, (DRISDOL) 50000 units CAPS capsule Take 1 capsule (50,000 Units total) by mouth every 7 (seven) days. (Patient not taking: Reported on 04/01/2017) 12 capsule 0   No current facility-administered medications for this visit.     Allergies as of 04/01/2017 - Review Complete 02/21/2017  Allergen Reaction Noted  . Iodine  04/13/2015  . Sinus formula [cholestatin] Nausea Only 04/13/2015    ROS:  General: Negative for anorexia, weight loss, fever, chills, fatigue, weakness. ENT: Negative for hoarseness, difficulty swallowing , nasal congestion. CV: Negative for chest pain, angina, palpitations, dyspnea on exertion, peripheral edema.  Respiratory: Negative for dyspnea at rest, dyspnea on exertion, cough, sputum, wheezing.  GI: See history of present illness. GU:  Negative for dysuria, hematuria, urinary incontinence, urinary frequency, nocturnal urination.  Endo: Negative for unusual weight change.    Physical Examination:   BP 131/75   Pulse (!) 109   Temp 98.3 F (36.8 C) (Oral)   Ht 5\' 3"  (1.6 m)   Wt 150 lb 3.2 oz (68.1 kg)   BMI 26.61 kg/m   General: Well-nourished, well-developed in no acute distress.  Eyes: No icterus. Conjunctivae pink. Mouth: Oropharyngeal mucosa moist and pink , no lesions  erythema or exudate. Lungs: Clear to auscultation bilaterally. Non-labored. Heart: Regular rate and rhythm, no murmurs rubs or gallops.  Abdomen: Bowel sounds are normal, Diffusely tender, nondistended, no hepatosplenomegaly or masses, no abdominal bruits or hernia , no rebound or guarding.   Extremities: No lower extremity edema. No clubbing or deformities. Neuro: Alert and oriented x 3.  Grossly intact. Skin: Warm and dry, no jaundice.   Psych: Alert and cooperative, normal mood  and affect.  Labs:    Imaging Studies: No results found.  Assessment and Plan:   Kimberly [name] is a 71 y.o. y/o female who comes in today with an additional 15 pounds weight loss.  The patient will be set up for an upper endoscopy due to her abdominal pain and she will also be set up for a CT scan of the abdomen and pelvis due to her weight loss and diffuse abdominal pain.  The patient has been explained the plan and agrees with it.    [name], MD. Marval Regal   Note: This dictation was prepared with Dragon dictation along with smaller phrase technology. Any transcriptional errors that result from this process are unintentional.

## 2017-04-01 NOTE — Patient Instructions (Signed)
You are scheduled for a CT scan at [facility] location on Monday, July 9th at 1:00pm. Please arrive at 12:45pm. You cannot have anything to eat or drink 4 hours prior to scan.  If you need to reschedule this procedure for any reason, please contact central scheduling at [phone].

## 2017-04-03 ENCOUNTER — Other Ambulatory Visit: Payer: Self-pay

## 2017-04-03 DIAGNOSIS — R1013 Epigastric pain: Secondary | ICD-10-CM

## 2017-04-03 NOTE — Discharge Instructions (Signed)
General Anesthesia, Adult, Care After °These instructions provide you with information about caring for yourself after your procedure. Your health care provider may also give you more specific instructions. Your treatment has been planned according to current medical practices, but problems sometimes occur. Call your health care provider if you have any problems or questions after your procedure. °What can I expect after the procedure? °After the procedure, it is common to have: °· Vomiting. °· A sore throat. °· Mental slowness. ° °It is common to feel: °· Nauseous. °· Cold or shivery. °· Sleepy. °· Tired. °· Sore or achy, even in parts of your body where you did not have surgery. ° °Follow these instructions at home: °For at least 24 hours after the procedure: °· Do not: °? Participate in activities where you could fall or become injured. °? Drive. °? Use heavy machinery. °? Drink alcohol. °? Take sleeping pills or medicines that cause drowsiness. °? Make important decisions or sign legal documents. °? Take care of children on your own. °· Rest. °Eating and drinking °· If you vomit, drink water, juice, or soup when you can drink without vomiting. °· Drink enough fluid to keep your urine clear or pale yellow. °· Make sure you have little or no nausea before eating solid foods. °· Follow the diet recommended by your health care provider. °General instructions °· Have a responsible adult stay with you until you are awake and alert. °· Return to your normal activities as told by your health care provider. Ask your health care provider what activities are safe for you. °· Take over-the-counter and prescription medicines only as told by your health care provider. °· If you smoke, do not smoke without supervision. °· Keep all follow-up visits as told by your health care provider. This is important. °Contact a health care provider if: °· You continue to have nausea or vomiting at home, and medicines are not helpful. °· You  cannot drink fluids or start eating again. °· You cannot urinate after 8-12 hours. °· You develop a skin rash. °· You have fever. °· You have increasing redness at the site of your procedure. °Get help right away if: °· You have difficulty breathing. °· You have chest pain. °· You have unexpected bleeding. °· You feel that you are having a life-threatening or urgent problem. °This information is not intended to replace advice given to you by your health care provider. Make sure you discuss any questions you have with your health care provider. °Document Released: 12/23/2000 Document Revised: 02/19/2016 Document Reviewed: 08/31/2015 °Elsevier Interactive Patient Education © 2018 [employer] ° °

## 2017-04-04 ENCOUNTER — Ambulatory Visit: Payer: Medicare HMO | Admitting: Anesthesiology

## 2017-04-04 ENCOUNTER — Encounter
Admission: RE | Disposition: A | Discharge status: Home / Self Care | Payer: Self-pay | Source: Ambulatory Visit | Attending: Gastroenterology

## 2017-04-04 ENCOUNTER — Ambulatory Visit
Admission: RE | Admit: 2017-04-04 | Discharge: 2017-04-04 | Disposition: A | Discharge status: Home / Self Care | Payer: Medicare HMO | Source: Ambulatory Visit | Attending: Gastroenterology | Admitting: Gastroenterology

## 2017-04-04 DIAGNOSIS — R634 Abnormal weight loss: Secondary | ICD-10-CM | POA: Diagnosis not present

## 2017-04-04 DIAGNOSIS — Z853 Personal history of malignant neoplasm of breast: Secondary | ICD-10-CM | POA: Diagnosis not present

## 2017-04-04 DIAGNOSIS — E785 Hyperlipidemia, unspecified: Secondary | ICD-10-CM | POA: Insufficient documentation

## 2017-04-04 DIAGNOSIS — M810 Age-related osteoporosis without current pathological fracture: Secondary | ICD-10-CM | POA: Insufficient documentation

## 2017-04-04 DIAGNOSIS — K2970 Gastritis, unspecified, without bleeding: Secondary | ICD-10-CM | POA: Diagnosis not present

## 2017-04-04 DIAGNOSIS — J449 Chronic obstructive pulmonary disease, unspecified: Secondary | ICD-10-CM | POA: Diagnosis not present

## 2017-04-04 DIAGNOSIS — R1013 Epigastric pain: Secondary | ICD-10-CM | POA: Insufficient documentation

## 2017-04-04 DIAGNOSIS — E559 Vitamin D deficiency, unspecified: Secondary | ICD-10-CM | POA: Diagnosis not present

## 2017-04-04 DIAGNOSIS — R1084 Generalized abdominal pain: Secondary | ICD-10-CM | POA: Diagnosis not present

## 2017-04-04 DIAGNOSIS — K2950 Unspecified chronic gastritis without bleeding: Secondary | ICD-10-CM | POA: Insufficient documentation

## 2017-04-04 DIAGNOSIS — Z791 Long term (current) use of non-steroidal anti-inflammatories (NSAID): Secondary | ICD-10-CM | POA: Diagnosis not present

## 2017-04-04 DIAGNOSIS — I2510 Atherosclerotic heart disease of native coronary artery without angina pectoris: Secondary | ICD-10-CM | POA: Insufficient documentation

## 2017-04-04 DIAGNOSIS — Z79899 Other long term (current) drug therapy: Secondary | ICD-10-CM | POA: Diagnosis not present

## 2017-04-04 DIAGNOSIS — Z6825 Body mass index (BMI) 25.0-25.9, adult: Secondary | ICD-10-CM | POA: Diagnosis not present

## 2017-04-04 DIAGNOSIS — F17210 Nicotine dependence, cigarettes, uncomplicated: Secondary | ICD-10-CM | POA: Diagnosis not present

## 2017-04-04 DIAGNOSIS — K2930 Chronic superficial gastritis without bleeding: Secondary | ICD-10-CM | POA: Diagnosis not present

## 2017-04-04 HISTORY — PX: ESOPHAGOGASTRODUODENOSCOPY (EGD) WITH PROPOFOL: SHX5813

## 2017-04-04 SURGERY — ESOPHAGOGASTRODUODENOSCOPY (EGD) WITH PROPOFOL
Anesthesia: General | Wound class: Clean Contaminated

## 2017-04-04 MED ORDER — ACETAMINOPHEN 325 MG PO TABS: 325.0000 mg | ORAL_TABLET | ORAL | Status: DC | PRN

## 2017-04-04 MED ORDER — PROPOFOL 10 MG/ML IV BOLUS
INTRAVENOUS | Status: DC | PRN
Start: 2017-04-04 — End: 2017-04-04
  Administered 2017-04-04: 70 mg via INTRAVENOUS
  Administered 2017-04-04: 20 mg via INTRAVENOUS

## 2017-04-04 MED ORDER — LIDOCAINE HCL (CARDIAC) 20 MG/ML IV SOLN
INTRAVENOUS | Status: DC | PRN
  Administered 2017-04-04: 40 mg via INTRAVENOUS

## 2017-04-04 MED ORDER — LACTATED RINGERS IV SOLN: INTRAVENOUS | Status: DC

## 2017-04-04 MED ORDER — GLYCOPYRROLATE 0.2 MG/ML IJ SOLN
INTRAMUSCULAR | Status: DC | PRN
  Administered 2017-04-04: 0.2 mg via INTRAVENOUS

## 2017-04-04 MED ORDER — LACTATED RINGERS IV SOLN
INTRAVENOUS | Status: DC | PRN
  Administered 2017-04-04: 12:00:00 via INTRAVENOUS

## 2017-04-04 MED ORDER — SODIUM CHLORIDE 0.9 % IV SOLN: INTRAVENOUS | Status: DC

## 2017-04-04 MED ORDER — ACETAMINOPHEN 160 MG/5ML PO SOLN: 325.0000 mg | ORAL | Status: DC | PRN

## 2017-04-04 SURGICAL SUPPLY — 32 items

## 2017-04-04 NOTE — Anesthesia Procedure Notes (Signed)
Performed by: Jazmynn Pho Pre-anesthesia Checklist: Patient identified, Emergency Drugs available, Suction available, Timeout performed and Patient being monitored Patient Re-evaluated:Patient Re-evaluated prior to induction Oxygen Delivery Method: Nasal cannula Placement Confirmation: positive ETCO2       

## 2017-04-04 NOTE — Anesthesia Preprocedure Evaluation (Signed)
Anesthesia Evaluation  Patient identified by MRN, date of birth, ID band Patient awake    Reviewed: Allergy & Precautions, H&P , NPO status , Patient's Chart, lab work & pertinent test results  Airway Mallampati: II  TM Distance: >3 FB Neck ROM: full    Dental no notable dental hx.    Pulmonary COPD, Current Smoker,     + wheezing      Cardiovascular + CAD  Normal cardiovascular exam Rhythm:regular Rate:Normal     Neuro/Psych    GI/Hepatic   Endo/Other    Renal/GU      Musculoskeletal   Abdominal   Peds  Hematology   Anesthesia Other Findings   Reproductive/Obstetrics                            Anesthesia Physical Anesthesia Plan  ASA: III  Anesthesia Plan: General   Post-op Pain Management:    Induction:   PONV Risk Score and Plan: 3 and Propofol  Airway Management Planned:   Additional Equipment:   Intra-op Plan:   Post-operative Plan:   Informed Consent: I have reviewed the patients History and Physical, chart, labs and discussed the procedure including the risks, benefits and alternatives for the proposed anesthesia with the patient or authorized representative who has indicated his/her understanding and acceptance.     Plan Discussed with: CRNA  Anesthesia Plan Comments:        Anesthesia Quick Evaluation

## 2017-04-04 NOTE — H&P (Signed)
Kimberly Lame, MD [address] [place], [place] [address] Phone:[phone] Fax : [phone]  Primary Care Physician:  Birdie Sons, MD Primary Gastroenterologist:  Dr. Allen Norris  Pre-Procedure History & Physical: HPI:  Kimberly [name] is a 71 y.o. female is here for an endoscopy.   Past Medical History:  Diagnosis Date  . Cancer (HCC)    breast  . Chronic back pain   . Chronic knee pain   . Degenerative disc disease, lumbar 04/2013  . Hyperlipidemia   . Neuropathy   . Osteoarthritis    of knee  . Osteoporosis   . Tobacco abuse   . Vitamin D deficiency     Past Surgical History:  Procedure Laterality Date  . APPENDECTOMY  1965  . BREAST EXCISIONAL BIOPSY Left 08/01/2003   lumpectomy rad 11/04-2/28/2005  . CESAREAN SECTION     x3  . COLONOSCOPY W/ POLYPECTOMY    . HIP FRACTURE SURGERY Left 01/25/2012  . NECK SURGERY  12/2011  . WRIST FRACTURE SURGERY      Prior to Admission medications   Medication Sig Start Date End Date Taking? Authorizing Provider  budesonide-formoterol (SYMBICORT) 80-4.5 MCG/ACT inhaler Inhale 2 puffs into the lungs 2 (two) times daily.   Yes [provider]  Calcium Citrate-Vitamin D (CITRACAL/VITAMIN D PO) Take by mouth.   Yes [provider]  docusate sodium (COLACE) 100 MG capsule Take 1 capsule (100 mg total) by mouth daily. 01/01/17  Yes Trinna Post, PA-C  gabapentin (NEURONTIN) 400 MG capsule  01/07/17  Yes [provider]  HYDROcodone-acetaminophen (NORCO) 7.5-325 MG tablet Take 1-2 tablets by mouth every 6 (six) hours as needed for moderate pain. 03/20/17  Yes Birdie Sons, MD  meloxicam (MOBIC) 15 MG tablet TAKE 1 TABLET EVERY DAY AS NEEDED 11/09/16  Yes Birdie Sons, MD  naproxen (NAPROSYN) 500 MG tablet Take 500 mg by mouth 2 (two) times daily with a meal.   Yes [provider]  omeprazole (PRILOSEC) 20 MG capsule TAKE 1 CAPSULE EVERY DAY 11/09/16  Yes Birdie Sons, MD    pravastatin (PRAVACHOL) 40 MG tablet TAKE 1 TABLET (40 MG TOTAL) BY MOUTH DAILY. 02/17/17  Yes Birdie Sons, MD  Probiotic Product (PROBIOTIC DAILY PO) Take by mouth.   Yes [provider]  gabapentin (NEURONTIN) 300 MG capsule Take 300 mg by mouth.    [provider]  oxyCODONE-acetaminophen (PERCOCET) 7.5-325 MG tablet Take 1 tablet by mouth every 4 (four) hours as needed for severe pain. Patient not taking: Reported on 04/03/2017 02/03/17   Birdie Sons, MD  Vitamin D, Ergocalciferol, (DRISDOL) 50000 units CAPS capsule Take 1 capsule (50,000 Units total) by mouth every 7 (seven) days. Patient not taking: Reported on 04/03/2017 11/10/15   Birdie Sons, MD    Allergies as of 04/01/2017 - Review Complete 04/01/2017  Allergen Reaction Noted  . Iodine  04/13/2015  . Sinus formula [cholestatin] Nausea Only 04/13/2015    Family History  Problem Relation Age of Onset  . Diabetes Mother        type 2  . Coronary artery disease Mother   . Deep vein thrombosis Mother   . Cancer Father   . Asthma Brother   . Diabetes Brother        type 2    Social History   Social History  . Marital status: Divorced    Spouse name: N/A  . Number of children: N/A  .  Years of education: N/A   Occupational History  . Not on file.   Social History Main Topics  . Smoking status: Current Every Day Smoker    Packs/day: 0.50    Years: 55.00    Types: Cigarettes  . Smokeless tobacco: Never Used     Comment: started age 45 1/3 to 1 ppd  . Alcohol use 0.0 oz/week     Comment: occasionally drinks beer  . Drug use: No  . Sexual activity: Not on file   Other Topics Concern  . Not on file   Social History Narrative  . No narrative on file    Review of Systems: See HPI, otherwise negative ROS  Physical Exam: BP 132/85   Pulse 100   Temp 98.4 F (36.9 C)   Ht 5\' 5"  (1.651 m)   Wt 147 lb (66.7 kg)   SpO2 94%   BMI 24.46 kg/m  General:   Alert,  pleasant and  cooperative in NAD Head:  Normocephalic and atraumatic. Neck:  Supple; no masses or thyromegaly. Lungs:  Clear throughout to auscultation.    Heart:  Regular rate and rhythm. Abdomen:  Soft, nontender and nondistended. Normal bowel sounds, without guarding, and without rebound.   Neurologic:  Alert and  oriented x4;  grossly normal neurologically.  Impression/Plan: Kimberly [name] is here for an endoscopy to be performed for abd pain  Risks, benefits, limitations, and alternatives regarding  endoscopy have been reviewed with the patient.  Questions have been answered.  All parties agreeable.   Kimberly Lame, MD  04/04/2017, 11:01 AM

## 2017-04-04 NOTE — Transfer of Care (Signed)
Immediate Anesthesia Transfer of Care Note  Patient: Kimberly [name]  Procedure(s) Performed: Procedure(s): ESOPHAGOGASTRODUODENOSCOPY (EGD) WITH PROPOFOL (N/A)  Patient Location: PACU  Anesthesia Type: General  Level of Consciousness: awake, alert  and patient cooperative  Airway and Oxygen Therapy: Patient Spontanous Breathing and Patient connected to supplemental oxygen  Post-op Assessment: Post-op Vital signs reviewed, Patient's Cardiovascular Status Stable, Respiratory Function Stable, Patent Airway and No signs of Nausea or vomiting  Post-op Vital Signs: Reviewed and stable  Complications: No apparent anesthesia complications

## 2017-04-04 NOTE — Anesthesia Postprocedure Evaluation (Signed)
Anesthesia Post Note  Patient: Kimberly [name]  Procedure(s) Performed: Procedure(s) (LRB): ESOPHAGOGASTRODUODENOSCOPY (EGD) WITH PROPOFOL (N/A)  Patient location during evaluation: PACU Anesthesia Type: General Level of consciousness: awake and alert and oriented Pain management: satisfactory to patient Vital Signs Assessment: post-procedure vital signs reviewed and stable Respiratory status: spontaneous breathing, nonlabored ventilation and respiratory function stable Cardiovascular status: blood pressure returned to baseline and stable Postop Assessment: Adequate PO intake and No signs of nausea or vomiting Anesthetic complications: no    Raliegh Ip

## 2017-04-04 NOTE — Op Note (Signed)
[facility] Patient Name: Kimberly [name] Procedure Date: 04/04/2017 12:06 PM MRN: [id number] Account #: [id number] Date of Birth: 11-06-1945 Admit Type: Outpatient Age: 71 Room: [facility] OR ROOM 01 Gender: Female Note Status: Finalized Procedure:            Upper GI endoscopy Indications:          Epigastric abdominal pain, Weight loss Providers:            [name] MD, MD Referring MD:         Kirstie Peri. Caryn Section, MD (Referring MD) Medicines:            Propofol per Anesthesia Complications:        No immediate complications. Procedure:            Pre-Anesthesia Assessment:                       - Prior to the procedure, a History and Physical was                        performed, and patient medications and allergies were                        reviewed. The patient's tolerance of previous                        anesthesia was also reviewed. The risks and benefits of                        the procedure and the sedation options and risks were                        discussed with the patient. All questions were                        answered, and informed consent was obtained. Prior                        Anticoagulants: The patient has taken no previous                        anticoagulant or antiplatelet agents. ASA Grade                        Assessment: II - A patient with mild systemic disease.                        After reviewing the risks and benefits, the patient was                        deemed in satisfactory condition to undergo the                        procedure.                       After obtaining informed consent, the endoscope was                        passed under direct vision. Throughout the procedure,  the patient's blood pressure, pulse, and oxygen                        saturations were monitored continuously. The Olympus                        [id number]-[id number] Endoscope (S#. [id number]) was introduced                  through the mouth, and advanced to the second part of                        duodenum. The upper GI endoscopy was accomplished                        without difficulty. The patient tolerated the procedure                        well. Findings:      The examined esophagus was normal.      Diffuse moderate inflammation characterized by erythema was found in the       gastric antrum. Biopsies were taken with a cold forceps for histology.      The examined duodenum was normal. Impression:           - Normal esophagus.                       - Gastritis. Biopsied.                       - Normal examined duodenum. Recommendation:       - Discharge patient to home.                       - Resume previous diet.                       - Continue present medications.                       - Await pathology results. Procedure Code(s):    --- Professional ---                       [phone], Esophagogastroduodenoscopy, flexible, transoral;                        with biopsy, single or multiple Diagnosis Code(s):    --- Professional ---                       R63.4, Abnormal weight loss                       R10.13, Epigastric pain                       K29.70, Gastritis, unspecified, without bleeding CPT copyright 2016 [employer]. All rights reserved. The codes documented in this report are preliminary and upon coder review may  be revised to meet current compliance requirements. [name] MD, MD 04/04/2017 12:21:45 PM This report has been signed electronically. Number of Addenda: 0 Note Initiated On: 04/04/2017 12:06 PM Total Procedure Duration: 0 hours 2 minutes 42 seconds  [facility]

## 2017-04-07 ENCOUNTER — Ambulatory Visit: Admission: RE | Admit: 2017-04-07 | Payer: Medicare HMO | Source: Ambulatory Visit

## 2017-04-08 ENCOUNTER — Ambulatory Visit
Admission: RE | Admit: 2017-04-08 | Discharge: 2017-04-08 | Disposition: A | Discharge status: Home / Self Care | Payer: Medicare HMO | Source: Ambulatory Visit | Attending: Gastroenterology | Admitting: Gastroenterology

## 2017-04-08 DIAGNOSIS — D3501 Benign neoplasm of right adrenal gland: Secondary | ICD-10-CM | POA: Diagnosis not present

## 2017-04-08 DIAGNOSIS — R109 Unspecified abdominal pain: Secondary | ICD-10-CM

## 2017-04-08 DIAGNOSIS — I700 Atherosclerosis of aorta: Secondary | ICD-10-CM | POA: Insufficient documentation

## 2017-04-08 DIAGNOSIS — K56609 Unspecified intestinal obstruction, unspecified as to partial versus complete obstruction: Secondary | ICD-10-CM | POA: Diagnosis not present

## 2017-04-08 DIAGNOSIS — D3502 Benign neoplasm of left adrenal gland: Secondary | ICD-10-CM | POA: Diagnosis not present

## 2017-04-08 HISTORY — DX: Malignant neoplasm of unspecified site of left female breast: C50.912

## 2017-04-08 LAB — POCT I-STAT CREATININE: CREATININE: 0.8 mg/dL (ref 0.44–1.00)

## 2017-04-08 MED ORDER — IOPAMIDOL (ISOVUE-300) INJECTION 61%
100.0000 mL | Freq: Once | INTRAVENOUS | Status: AC | PRN
  Administered 2017-04-08: 100 mL via INTRAVENOUS

## 2017-04-09 ENCOUNTER — Other Ambulatory Visit: Payer: Self-pay

## 2017-04-09 ENCOUNTER — Encounter: Payer: Self-pay | Admitting: Gastroenterology

## 2017-04-09 ENCOUNTER — Ambulatory Visit (INDEPENDENT_AMBULATORY_CARE_PROVIDER_SITE_OTHER): Payer: Medicare HMO | Admitting: Gastroenterology

## 2017-04-09 VITALS — BP 156/84 | HR 94 | Ht 63.0 in | Wt 145.0 lb

## 2017-04-09 DIAGNOSIS — R935 Abnormal findings on diagnostic imaging of other abdominal regions, including retroperitoneum: Secondary | ICD-10-CM

## 2017-04-09 NOTE — Progress Notes (Signed)
Primary Care Physician: Birdie Sons, MD  Primary Gastroenterologist:  Dr. [name]  No chief complaint on file.   HPI: Kimberly [name] is a 71 y.o. female here for follow-up after having a CT scan. The patient had weight loss and abdominal pain with bloating. The patient was reporting epigastric pain and underwent an upper endoscopy that did not show any signs of a cause for her symptoms. At the time the patient was reporting that the pain was worse when she ate or drank. The patient had a colonoscopy in 2016 with some polyps found at that time. The patient then came to see me for follow-up on July 3 and a CT scan of the abdomen was ordered. The findings showed: 1. Partial obstruction at the level of the transverse colon, likely secondary to carcinoma.  2. Ileocolic mesenteric adenopathy, suspicious for nodal metastasis.   Current Outpatient Prescriptions  Medication Sig Dispense Refill  . budesonide-formoterol (SYMBICORT) 80-4.5 MCG/ACT inhaler Inhale 2 puffs into the lungs 2 (two) times daily.    . Calcium Citrate-Vitamin D (CITRACAL/VITAMIN D PO) Take by mouth.    . docusate sodium (COLACE) 100 MG capsule Take 1 capsule (100 mg total) by mouth daily. 14 capsule 0  . gabapentin (NEURONTIN) 300 MG capsule Take 300 mg by mouth.    . gabapentin (NEURONTIN) 400 MG capsule     . HYDROcodone-acetaminophen (NORCO) 7.5-325 MG tablet Take 1-2 tablets by mouth every 6 (six) hours as needed for moderate pain. 120 tablet 0  . meloxicam (MOBIC) 15 MG tablet TAKE 1 TABLET EVERY DAY AS NEEDED 90 tablet 3  . naproxen (NAPROSYN) 500 MG tablet Take 500 mg by mouth 2 (two) times daily with a meal.    . omeprazole (PRILOSEC) 20 MG capsule TAKE 1 CAPSULE EVERY DAY 90 capsule 4  . oxyCODONE-acetaminophen (PERCOCET) 7.5-325 MG tablet Take 1 tablet by mouth every 4 (four) hours as needed for severe pain. (Patient not taking: Reported on 04/03/2017) 20 tablet 0  . pravastatin (PRAVACHOL) 40 MG tablet TAKE  1 TABLET (40 MG TOTAL) BY MOUTH DAILY. 90 tablet 4  . Probiotic Product (PROBIOTIC DAILY PO) Take by mouth.    . Vitamin D, Ergocalciferol, (DRISDOL) 50000 units CAPS capsule Take 1 capsule (50,000 Units total) by mouth every 7 (seven) days. (Patient not taking: Reported on 04/03/2017) 12 capsule 0   No current facility-administered medications for this visit.     Allergies as of 04/09/2017 - Review Complete 04/04/2017  Allergen Reaction Noted  . Sinus formula [cholestatin] Nausea Only 04/13/2015  . Iodine Other (See Comments) 04/13/2015    ROS:  General: Negative for anorexia, weight loss, fever, chills, fatigue, weakness. ENT: Negative for hoarseness, difficulty swallowing , nasal congestion. CV: Negative for chest pain, angina, palpitations, dyspnea on exertion, peripheral edema.  Respiratory: Negative for dyspnea at rest, dyspnea on exertion, cough, sputum, wheezing.  GI: See history of present illness. GU:  Negative for dysuria, hematuria, urinary incontinence, urinary frequency, nocturnal urination.  Endo: Negative for unusual weight change.    Physical Examination:   There were no vitals taken for this visit.  General: Well-nourished, well-developed in no acute distress.  Eyes: No icterus. Conjunctivae pink. Extremities: No lower extremity edema. No clubbing or deformities. Neuro: Alert and oriented x 3.  Grossly intact. Skin: Warm and dry, no jaundice.   Psych: Alert and cooperative, normal mood and affect.  Labs:    Imaging Studies: Ct Abdomen Pelvis W Contrast  Result  Date: 04/08/2017 CLINICAL DATA:  Abdominal pain with distension since March. Prior appendectomy. EXAM: CT ABDOMEN AND PELVIS WITH CONTRAST TECHNIQUE: Multidetector CT imaging of the abdomen and pelvis was performed using the standard protocol following bolus administration of intravenous contrast. CONTRAST:  16mL ISOVUE-300 IOPAMIDOL (ISOVUE-300) INJECTION 61% COMPARISON:  Right upper quadrant abdominal  ultrasound 02/06/2017. No comparison abdominal CT. Chest CT 01/21/2017 reviewed. FINDINGS: Lower chest: Clear lung bases. Normal heart size without pericardial or pleural effusion. Hepatobiliary: Normal liver. Normal gallbladder, without biliary ductal dilatation. Pancreas: Normal, without mass or ductal dilatation. Spleen: Normal in size, without focal abnormality. Adrenals/Urinary Tract: Left larger than right low-density adrenal masses, most consistent with adenomas. Example on the left at 5.5 x 3.7 cm. On the right at 2.3 cm. Normal kidneys, without hydronephrosis. Degraded evaluation of the pelvis, secondary to beam hardening artifact from left proximal femoral fixation. Grossly normal urinary bladder. Stomach/Bowel: Normal stomach, without wall thickening. The distal colon is normal in caliber. A transition between normal caliber and moderate colonic distension occurs within the transverse segment, with an area of soft tissue thickening. Example image 21/series 2. The more proximal colon is moderately dilated and fluid-filled. The distal small bowel is also dilated with gradual transition to upper normal caliber proximal small bowel. Vascular/Lymphatic: Advanced aortic and branch vessel atherosclerosis. Mild ileocolic mesenteric adenopathy, including at 10 mm on image 45/series 2 and image 44/series 2. A pericolonic 5 mm node on image 14/series 2 is suspicious based on location. No pelvic sidewall adenopathy. Reproductive: Normal uterus and adnexa. Other: No significant free fluid. Mild pelvic floor laxity. No evidence of omental or peritoneal disease. Musculoskeletal: Left proximal femoral fixation lower thoracic hemangioma. IMPRESSION: 1. Partial obstruction at the level of the transverse colon, likely secondary to carcinoma. 2. Ileocolic mesenteric adenopathy, suspicious for nodal metastasis. 3.  Aortic Atherosclerosis (ICD10-I70.0). 4. Bilateral adrenal adenomas. These results will be called to the  ordering clinician or representative by the Radiologist Assistant, and communication documented in the PACS or zVision Dashboard. Electronically Signed   By: Abigail Miyamoto M.D.   On: 04/08/2017 14:21    Assessment and Plan:   NATHA GUIN is a 72 y.o. y/o female who comes in with a CT scan showing a partially obstructing transverse colon mass with enlarged lymph nodes. The patient will be set up for a colonoscopy for tissue biopsies of the lesion. The patient has been explained the findings. Of note the patient has had a long history of smoking and has had breast cancer. I have discussed risks & benefits which include, but are not limited to, bleeding, infection, perforation & drug reaction.  The patient agrees with this plan & written consent will be obtained.       [name], MD. Marval Regal   Note: This dictation was prepared with Dragon dictation along with smaller phrase technology. Any transcriptional errors that result from this process are unintentional.

## 2017-04-11 ENCOUNTER — Other Ambulatory Visit: Payer: Self-pay

## 2017-04-11 DIAGNOSIS — R935 Abnormal findings on diagnostic imaging of other abdominal regions, including retroperitoneum: Secondary | ICD-10-CM

## 2017-04-14 ENCOUNTER — Encounter: Payer: Self-pay | Admitting: *Deleted

## 2017-04-14 ENCOUNTER — Emergency Department: Payer: Medicare HMO

## 2017-04-14 ENCOUNTER — Inpatient Hospital Stay
Admission: EM | Admit: 2017-04-14 | Discharge: 2017-04-19 | DRG: 330 | Disposition: A | Discharge status: Home Health | Payer: Medicare HMO | Attending: Internal Medicine | Admitting: Internal Medicine

## 2017-04-14 DIAGNOSIS — Z853 Personal history of malignant neoplasm of breast: Secondary | ICD-10-CM | POA: Diagnosis not present

## 2017-04-14 DIAGNOSIS — G629 Polyneuropathy, unspecified: Secondary | ICD-10-CM | POA: Diagnosis present

## 2017-04-14 DIAGNOSIS — Z23 Encounter for immunization: Secondary | ICD-10-CM | POA: Diagnosis present

## 2017-04-14 DIAGNOSIS — M1710 Unilateral primary osteoarthritis, unspecified knee: Secondary | ICD-10-CM | POA: Diagnosis present

## 2017-04-14 DIAGNOSIS — F17210 Nicotine dependence, cigarettes, uncomplicated: Secondary | ICD-10-CM | POA: Diagnosis present

## 2017-04-14 DIAGNOSIS — Z791 Long term (current) use of non-steroidal anti-inflammatories (NSAID): Secondary | ICD-10-CM

## 2017-04-14 DIAGNOSIS — G8929 Other chronic pain: Secondary | ICD-10-CM | POA: Diagnosis present

## 2017-04-14 DIAGNOSIS — Z888 Allergy status to other drugs, medicaments and biological substances status: Secondary | ICD-10-CM | POA: Diagnosis not present

## 2017-04-14 DIAGNOSIS — K56691 Other complete intestinal obstruction: Secondary | ICD-10-CM | POA: Diagnosis not present

## 2017-04-14 DIAGNOSIS — R1084 Generalized abdominal pain: Secondary | ICD-10-CM | POA: Diagnosis present

## 2017-04-14 DIAGNOSIS — E663 Overweight: Secondary | ICD-10-CM | POA: Diagnosis present

## 2017-04-14 DIAGNOSIS — Z7951 Long term (current) use of inhaled steroids: Secondary | ICD-10-CM

## 2017-04-14 DIAGNOSIS — C184 Malignant neoplasm of transverse colon: Secondary | ICD-10-CM | POA: Diagnosis present

## 2017-04-14 DIAGNOSIS — R0902 Hypoxemia: Secondary | ICD-10-CM | POA: Diagnosis present

## 2017-04-14 DIAGNOSIS — M810 Age-related osteoporosis without current pathological fracture: Secondary | ICD-10-CM | POA: Diagnosis present

## 2017-04-14 DIAGNOSIS — Z6824 Body mass index (BMI) 24.0-24.9, adult: Secondary | ICD-10-CM

## 2017-04-14 DIAGNOSIS — M5136 Other intervertebral disc degeneration, lumbar region: Secondary | ICD-10-CM | POA: Diagnosis present

## 2017-04-14 DIAGNOSIS — D72829 Elevated white blood cell count, unspecified: Secondary | ICD-10-CM | POA: Diagnosis not present

## 2017-04-14 DIAGNOSIS — R112 Nausea with vomiting, unspecified: Secondary | ICD-10-CM

## 2017-04-14 DIAGNOSIS — I2510 Atherosclerotic heart disease of native coronary artery without angina pectoris: Secondary | ICD-10-CM | POA: Diagnosis present

## 2017-04-14 DIAGNOSIS — K567 Ileus, unspecified: Secondary | ICD-10-CM | POA: Diagnosis not present

## 2017-04-14 DIAGNOSIS — E785 Hyperlipidemia, unspecified: Secondary | ICD-10-CM | POA: Diagnosis present

## 2017-04-14 DIAGNOSIS — K6389 Other specified diseases of intestine: Secondary | ICD-10-CM | POA: Diagnosis not present

## 2017-04-14 DIAGNOSIS — Z923 Personal history of irradiation: Secondary | ICD-10-CM | POA: Diagnosis not present

## 2017-04-14 DIAGNOSIS — K56609 Unspecified intestinal obstruction, unspecified as to partial versus complete obstruction: Secondary | ICD-10-CM | POA: Diagnosis present

## 2017-04-14 DIAGNOSIS — Z86010 Personal history of colonic polyps: Secondary | ICD-10-CM | POA: Diagnosis not present

## 2017-04-14 DIAGNOSIS — Z720 Tobacco use: Secondary | ICD-10-CM | POA: Diagnosis not present

## 2017-04-14 DIAGNOSIS — R109 Unspecified abdominal pain: Secondary | ICD-10-CM | POA: Diagnosis not present

## 2017-04-14 LAB — CBC
HEMATOCRIT: 49 % — AB (ref 35.0–47.0)
Hemoglobin: 16.5 g/dL — ABNORMAL HIGH (ref 12.0–16.0)
MCH: 28.7 pg (ref 26.0–34.0)
MCHC: 33.7 g/dL (ref 32.0–36.0)
MCV: 85.2 fL (ref 80.0–100.0)
Platelets: 569 10*3/uL — ABNORMAL HIGH (ref 150–440)
RBC: 5.76 MIL/uL — ABNORMAL HIGH (ref 3.80–5.20)
RDW: 19 % — AB (ref 11.5–14.5)
WBC: 16.2 10*3/uL — AB (ref 3.6–11.0)

## 2017-04-14 LAB — COMPREHENSIVE METABOLIC PANEL
ALT: 18 U/L (ref 14–54)
ANION GAP: 12 (ref 5–15)
AST: 22 U/L (ref 15–41)
Albumin: 3.4 g/dL — ABNORMAL LOW (ref 3.5–5.0)
Alkaline Phosphatase: 134 U/L — ABNORMAL HIGH (ref 38–126)
BILIRUBIN TOTAL: 0.8 mg/dL (ref 0.3–1.2)
BUN: 17 mg/dL (ref 6–20)
CALCIUM: 8.9 mg/dL (ref 8.9–10.3)
CO2: 27 mmol/L (ref 22–32)
Chloride: 97 mmol/L — ABNORMAL LOW (ref 101–111)
Creatinine, Ser: 0.9 mg/dL (ref 0.44–1.00)
GFR calc Af Amer: >60 mL/min (ref >60)
Glucose, Bld: 145 mg/dL — ABNORMAL HIGH (ref 65–99)
POTASSIUM: 4.2 mmol/L (ref 3.5–5.1)
Sodium: 136 mmol/L (ref 135–145)
TOTAL PROTEIN: 7.6 g/dL (ref 6.5–8.1)

## 2017-04-14 LAB — LIPASE, BLOOD: Lipase: 18 U/L (ref 11–51)

## 2017-04-14 NOTE — ED Triage Notes (Signed)
Pt presents to ED via POV with c/o generalized ABD pain that became severe around 6pm tonight. Pt's daughetr reports pt is scheduled for colonoscopy tomorrow and was taking colon prep before pain started. Pt's daughter states pt has yet to have a BM since taking bowel prep. Daughter reports pt has a "mass that might be blocking the colon" and was told to come to the ER if pt began experiencing extreme abdominal pain. Pt reports 1 episode of emesis PTA.

## 2017-04-14 NOTE — ED Provider Notes (Signed)
[facility] Emergency Department Provider Note   ____________________________________________   First MD Initiated Contact with Patient 04/14/17 2348     (approximate)  I have reviewed the triage vital signs and the nursing notes.   HISTORY  Chief Complaint Abdominal Pain    HPI Kimberly [name] is a 71 y.o. female who presents to the ED from home with a chief complaint of abdominal pain and vomiting. Patient is scheduled for colonoscopy in the morning by Dr. Allen Norris. She had an obstructive process seen on CT scan recently and scheduled for colonoscopy to evaluate for mass. Patient started her bowel prep approximately 5 PM and has had severe generalized abdominal pain with nausea/vomiting 2 since. Last bowel movement in the morning which was tiny and hard.Denies recent fever, chills, chest pain, shortness of breath, dysuria. Denies recent travel or trauma. Nothing makes her symptoms better; drinking bowel prep makes her symptoms worse.   Past Medical History:  Diagnosis Date  . Breast cancer, left (HCC)    Lumpectomy and rad tx's.  . Chronic back pain   . Chronic knee pain   . Degenerative disc disease, lumbar 04/2013  . Hyperlipidemia   . Neuropathy   . Osteoarthritis    of knee  . Osteoporosis   . Tobacco abuse   . Vitamin D deficiency     Patient Active Problem List   Diagnosis Date Noted  . Abdominal pain, generalized   . Loss of weight   . Gastritis without bleeding   . Absence of bladder continence 04/01/2017  . Asthmatic breathing 04/01/2017  . Need for vaccination 04/01/2017  . Abnormal abdominal x-ray 02/11/2017  . Acute bacterial sinusitis 02/11/2017  . OP (osteoporosis) 02/11/2017  . Encounter for general adult medical examination without abnormal findings 02/11/2017  . Skin lesion 02/11/2017  . Screening breast examination 02/11/2017  . Breast swelling 02/11/2017  . Bronchitis 02/11/2017  . Bursitis of hip 02/11/2017  . Cervical  cancer screening 02/11/2017  . Gonalgia 02/11/2017  . Conjunctivitis 02/11/2017  . Narrowing of intervertebral disc space 02/11/2017  . Accumulation of fluid in tissues 02/11/2017  . Family history of colon cancer 02/11/2017  . Foot pain 02/11/2017  . Flu vaccine need 02/11/2017  . Arthralgia of hip 02/11/2017  . Personal history of malignant neoplasm of breast 02/11/2017  . Received influenza vaccination at hospital 02/11/2017  . Eczema intertrigo 02/11/2017  . Neuropathy 02/11/2017  . Osteoarthrosis, unspecified whether generalized or localized, involving lower leg 02/11/2017  . Coronary atherosclerosis 01/29/2017  . Personal history of tobacco use, presenting hazards to health 01/21/2017  . Smoking greater than 30 pack years 01/10/2017  . Vitamin D deficiency 08/21/2016  . Hyperlipidemia 11/10/2015  . Chronic back pain 05/26/2015  . DDD (degenerative disc disease), lumbosacral 05/26/2015  . History of colon polyps 04/27/2007    Past Surgical History:  Procedure Laterality Date  . APPENDECTOMY  1965  . BREAST EXCISIONAL BIOPSY Left 08/01/2003   lumpectomy rad 11/04-2/28/2005  . CESAREAN SECTION     x3  . COLONOSCOPY W/ POLYPECTOMY    . ESOPHAGOGASTRODUODENOSCOPY (EGD) WITH PROPOFOL N/A 04/04/2017   Procedure: ESOPHAGOGASTRODUODENOSCOPY (EGD) WITH PROPOFOL;  Surgeon: [name], MD;  Location: [facility];  Service: Endoscopy;  Laterality: N/A;  . HIP FRACTURE SURGERY Left 01/25/2012  . NECK SURGERY  12/2011  . WRIST FRACTURE SURGERY      Prior to Admission medications   Medication Sig Start Date End Date Taking? Authorizing Provider  budesonide-formoterol (  SYMBICORT) 80-4.5 MCG/ACT inhaler Inhale 2 puffs into the lungs 2 (two) times daily.   Yes [provider]  Calcium Citrate-Vitamin D (CITRACAL/VITAMIN D PO) Take by mouth.   Yes [provider]  docusate sodium (COLACE) 100 MG capsule Take 1 capsule (100 mg total) by mouth daily. 01/01/17  Yes  Trinna Post, PA-C  gabapentin (NEURONTIN) 400 MG capsule Take 400 mg by mouth 3 (three) times daily.    Yes [provider]  HYDROcodone-acetaminophen (NORCO) 7.5-325 MG tablet Take 1-2 tablets by mouth every 6 (six) hours as needed for moderate pain. 03/20/17  Yes Birdie Sons, MD  meloxicam (MOBIC) 15 MG tablet TAKE 1 TABLET EVERY DAY AS NEEDED 11/09/16  Yes Birdie Sons, MD  naproxen (NAPROSYN) 500 MG tablet Take 500 mg by mouth 2 (two) times daily with a meal.   Yes [provider]  omeprazole (PRILOSEC) 20 MG capsule TAKE 1 CAPSULE EVERY DAY 11/09/16  Yes Birdie Sons, MD  pravastatin (PRAVACHOL) 40 MG tablet TAKE 1 TABLET (40 MG TOTAL) BY MOUTH DAILY. 02/17/17  Yes Birdie Sons, MD  Probiotic Product (PROBIOTIC DAILY PO) Take by mouth.   Yes [provider]  Vitamin D, Ergocalciferol, (DRISDOL) 50000 units CAPS capsule Take 1 capsule (50,000 Units total) by mouth every 7 (seven) days. 11/10/15  Yes Birdie Sons, MD    Allergies Sinus formula [cholestatin] and Iodine  Family History  Problem Relation Age of Onset  . Diabetes Mother        type 2  . Coronary artery disease Mother   . Deep vein thrombosis Mother   . Cancer Father   . Asthma Brother   . Diabetes Brother        type 2  Colon cancer  Social History Social History  Substance Use Topics  . Smoking status: Current Every Day Smoker    Packs/day: 0.50    Years: 55.00    Types: Cigarettes  . Smokeless tobacco: Never Used     Comment: started age 69 1/2 to 1 ppd  . Alcohol use 0.0 oz/week     Comment: occasionally drinks beer    Review of Systems  Constitutional: No fever/chills. Eyes: No visual changes. ENT: No sore throat. Cardiovascular: Denies chest pain. Respiratory: Denies shortness of breath. Gastrointestinal: Positive for abdominal pain, nausea and vomiting.  No diarrhea.  No constipation. Genitourinary: Negative for dysuria. Musculoskeletal: Negative  for back pain. Skin: Negative for rash. Neurological: Negative for headaches, focal weakness or numbness.   ____________________________________________   PHYSICAL EXAM:  VITAL SIGNS: ED Triage Vitals  Enc Vitals Group     BP 04/14/17 2214 (!) 131/101     Pulse Rate 04/14/17 2214 (!) 102     Resp 04/14/17 2258 19     Temp 04/14/17 2214 98.2 F (36.8 C)     Temp Source 04/14/17 2214 Oral     SpO2 04/14/17 2214 94 %     Weight 04/14/17 2209 145 lb (65.8 kg)     Height 04/14/17 2209 5\' 4"  (1.626 m)     Head Circumference --      Peak Flow --      Pain Score 04/14/17 2209 10     Pain Loc --      Pain Edu? --      Excl. in [place]? --     Constitutional: Alert and oriented. Uncomfortable appearing and in mild acute distress. Eyes: Conjunctivae are normal. PERRL. EOMI. Head:  Atraumatic. Nose: No congestion/rhinnorhea. Mouth/Throat: Mucous membranes are moist.  Oropharynx non-erythematous. Neck: No stridor.   Cardiovascular: Normal rate, regular rhythm. Grossly normal heart sounds.  Good peripheral circulation. Respiratory: Normal respiratory effort.  No retractions. Lungs diminished bilaterally; otherwise CTAB. Gastrointestinal: Soft and moderately tender to palpation diffusely without rebound or guarding. No distention. No abdominal bruits. No CVA tenderness. Musculoskeletal: No lower extremity tenderness nor edema.  No joint effusions. Neurologic:  Normal speech and language. No gross focal neurologic deficits are appreciated.  Skin:  Skin is warm, dry and intact. No rash noted. Psychiatric: Mood and affect are normal. Speech and behavior are normal.  ____________________________________________   LABS (all labs ordered are listed, but only abnormal results are displayed)  Labs Reviewed  COMPREHENSIVE METABOLIC PANEL - Abnormal; Notable for the following:       Result Value   Chloride 97 (*)    Glucose, Bld 145 (*)    Albumin 3.4 (*)    Alkaline Phosphatase 134 (*)     All other components within normal limits  CBC - Abnormal; Notable for the following:    WBC 16.2 (*)    RBC 5.76 (*)    Hemoglobin 16.5 (*)    HCT 49.0 (*)    RDW 19.0 (*)    Platelets 569 (*)    All other components within normal limits  LIPASE, BLOOD  URINALYSIS, COMPLETE (UACMP) WITH MICROSCOPIC   ____________________________________________  EKG  None ____________________________________________  RADIOLOGY  Dg Abdomen Acute W/chest  Result Date: 04/14/2017 CLINICAL DATA:  Initial evaluation for acute abdominal pain. EXAM: DG ABDOMEN ACUTE W/ 1V CHEST COMPARISON:  Prior CT from 04/08/2017. FINDINGS: Cardiac and mediastinal silhouettes are within normal limits. Aortic atherosclerosis. Lungs are mildly hypoinflated. Chronic coarsening of the interstitial markings. No focal infiltrates. No pulmonary edema or pleural effusion. No pneumothorax. Multiple prominent dilated gas-filled loops of bowel seen scattered throughout the abdomen. Air-fluid levels seen on upright projection. Paucity of gas overlies the distal bowel. Few decompressed contrast filled loops of bowel are overlie the pelvis. Findings concerning for persistent obstructive process, likely similar to previous CT. No free air. No soft tissue mass or abnormal calcification. No acute osseus abnormality. Degenerative changes noted within the spine. Fixation hardware noted at the left hip. IMPRESSION: 1. Multiple dilated loops of bowel scattered throughout the abdomen with associated internal air-fluid levels, consistent with ongoing/persistent obstructive process, likely similar relative to recent CT from 04/08/2017. 2. No active cardiopulmonary disease. 3. Aortic atherosclerosis. Electronically Signed   By: Jeannine Boga M.D.   On: 04/14/2017 23:19    ____________________________________________   PROCEDURES  Procedure(s) performed: None  Procedures  Critical Care performed:  No  ____________________________________________   INITIAL IMPRESSION / ASSESSMENT AND PLAN / ED COURSE  Pertinent labs & imaging results that were available during my care of the patient were reviewed by me and considered in my medical decision making (see chart for details).  71 year old female who presents with severe abdominal pain, nausea and vomiting while undergoing bowel prep for colonoscopy to evaluate mass. Laboratory results remarkable for leukocytosis and dehydration. Will initiate IV fluid resuscitation, analgesia and antiemetic. Imaging studies consistent with obstructive process seen on CT scan. Patient was placed on 2 L nasal cannula oxygen for room air saturations 88%. Notified Dr. Allen Norris from GI and discussed with hospitalist to evaluate patient in the emergency department for admission. Patient last vomited approximately one hour ago; discuss with her if she continues to vomit she may need a  NG tube.      ____________________________________________   FINAL CLINICAL IMPRESSION(S) / ED DIAGNOSES  Final diagnoses:  Generalized abdominal pain  Non-intractable vomiting with nausea, unspecified vomiting type  Hypoxia      NEW MEDICATIONS STARTED DURING THIS VISIT:  New Prescriptions   No medications on file     Note:  This document was prepared using Dragon voice recognition software and may include unintentional dictation errors.    Paulette Blanch, MD 04/15/17 [phone]

## 2017-04-15 ENCOUNTER — Encounter
Admission: EM | Disposition: A | Discharge status: Home Health | Payer: Self-pay | Source: Home / Self Care | Attending: Internal Medicine

## 2017-04-15 ENCOUNTER — Ambulatory Visit: Admission: RE | Admit: 2017-04-15 | Payer: Medicare HMO | Source: Ambulatory Visit | Admitting: Gastroenterology

## 2017-04-15 ENCOUNTER — Observation Stay: Payer: Medicare HMO | Admitting: Certified Registered Nurse Anesthetist

## 2017-04-15 ENCOUNTER — Encounter: Payer: Self-pay | Admitting: Anesthesiology

## 2017-04-15 ENCOUNTER — Encounter: Admission: RE | Payer: Self-pay | Source: Ambulatory Visit

## 2017-04-15 DIAGNOSIS — R1084 Generalized abdominal pain: Secondary | ICD-10-CM | POA: Diagnosis not present

## 2017-04-15 DIAGNOSIS — K56609 Unspecified intestinal obstruction, unspecified as to partial versus complete obstruction: Secondary | ICD-10-CM | POA: Diagnosis not present

## 2017-04-15 DIAGNOSIS — K56691 Other complete intestinal obstruction: Secondary | ICD-10-CM

## 2017-04-15 DIAGNOSIS — D72829 Elevated white blood cell count, unspecified: Secondary | ICD-10-CM | POA: Diagnosis not present

## 2017-04-15 DIAGNOSIS — Z720 Tobacco use: Secondary | ICD-10-CM | POA: Diagnosis not present

## 2017-04-15 HISTORY — PX: LAPAROTOMY: SHX154

## 2017-04-15 LAB — URINALYSIS, COMPLETE (UACMP) WITH MICROSCOPIC
BILIRUBIN URINE: NEGATIVE
GLUCOSE, UA: NEGATIVE mg/dL
HGB URINE DIPSTICK: NEGATIVE
Ketones, ur: 20 mg/dL — AB
LEUKOCYTES UA: NEGATIVE
Nitrite: NEGATIVE
Protein, ur: 30 mg/dL — AB
SPECIFIC GRAVITY, URINE: 1.026 (ref 1.005–1.030)
pH: 5 (ref 5.0–8.0)

## 2017-04-15 SURGERY — COLONOSCOPY WITH PROPOFOL
Anesthesia: General

## 2017-04-15 SURGERY — LAPAROTOMY, EXPLORATORY
Anesthesia: General | Site: Abdomen | Wound class: Clean Contaminated

## 2017-04-15 MED ORDER — LACTATED RINGERS IV SOLN
INTRAVENOUS | Status: DC | PRN
  Administered 2017-04-15: 17:00:00 via INTRAVENOUS

## 2017-04-15 MED ORDER — ACETAMINOPHEN 325 MG PO TABS: 650.0000 mg | ORAL_TABLET | Freq: Four times a day (QID) | ORAL | Status: DC | PRN

## 2017-04-15 MED ORDER — GABAPENTIN 400 MG PO CAPS
400.0000 mg | ORAL_CAPSULE | Freq: Three times a day (TID) | ORAL | Status: DC
  Administered 2017-04-15 – 2017-04-19 (×11): 400 mg via ORAL
  Filled 2017-04-15 (×11): qty 1

## 2017-04-15 MED ORDER — VITAMIN D (ERGOCALCIFEROL) 1.25 MG (50000 UNIT) PO CAPS
50000.0000 [IU] | ORAL_CAPSULE | ORAL | Status: DC
  Filled 2017-04-15: qty 1

## 2017-04-15 MED ORDER — PIPERACILLIN-TAZOBACTAM 3.375 G IVPB 30 MIN
3.3750 g | INTRAVENOUS | Status: DC
  Filled 2017-04-15: qty 50

## 2017-04-15 MED ORDER — ROCURONIUM BROMIDE 100 MG/10ML IV SOLN
INTRAVENOUS | Status: DC | PRN
Start: 2017-04-15 — End: 2017-04-15
  Administered 2017-04-15 (×2): 20 mg via INTRAVENOUS

## 2017-04-15 MED ORDER — DIPHENHYDRAMINE HCL 12.5 MG/5ML PO ELIX
12.5000 mg | ORAL_SOLUTION | Freq: Four times a day (QID) | ORAL | Status: DC | PRN
  Filled 2017-04-15: qty 5

## 2017-04-15 MED ORDER — FENTANYL CITRATE (PF) 100 MCG/2ML IJ SOLN
25.0000 ug | INTRAMUSCULAR | Status: AC | PRN
  Administered 2017-04-15 (×6): 25 ug via INTRAVENOUS

## 2017-04-15 MED ORDER — SODIUM CHLORIDE 0.9 % IV BOLUS (SEPSIS)
1000.0000 mL | Freq: Once | INTRAVENOUS | Status: AC
  Administered 2017-04-15: 1000 mL via INTRAVENOUS

## 2017-04-15 MED ORDER — ONDANSETRON HCL 4 MG PO TABS: 4.0000 mg | ORAL_TABLET | Freq: Four times a day (QID) | ORAL | Status: DC | PRN

## 2017-04-15 MED ORDER — MORPHINE SULFATE 2 MG/ML IV SOLN
INTRAVENOUS | Status: DC
  Administered 2017-04-15: 22:00:00 via INTRAVENOUS
  Administered 2017-04-16: 7.5 mg via INTRAVENOUS
  Administered 2017-04-16 (×2): 15 mg via INTRAVENOUS
  Administered 2017-04-16: 3 mg via INTRAVENOUS
  Administered 2017-04-16: 7.5 mg via INTRAVENOUS
  Administered 2017-04-16: 4.5 mg via INTRAVENOUS
  Administered 2017-04-16: 21 mg via INTRAVENOUS
  Administered 2017-04-16: 15 mg via INTRAVENOUS
  Administered 2017-04-17: 9 mg via INTRAVENOUS
  Administered 2017-04-17: 3 mg via INTRAVENOUS
  Filled 2017-04-15 (×4): qty 30

## 2017-04-15 MED ORDER — PIPERACILLIN-TAZOBACTAM 3.375 G IVPB 30 MIN
3.3750 g | INTRAVENOUS | Status: AC
  Administered 2017-04-15: 3.375 g via INTRAVENOUS
  Filled 2017-04-15: qty 50

## 2017-04-15 MED ORDER — DIPHENHYDRAMINE HCL 50 MG/ML IJ SOLN: 12.5000 mg | Freq: Four times a day (QID) | INTRAMUSCULAR | Status: DC | PRN

## 2017-04-15 MED ORDER — HYDROMORPHONE HCL 1 MG/ML IJ SOLN
0.5000 mg | Freq: Once | INTRAMUSCULAR | Status: AC
  Administered 2017-04-15: 0.5 mg via INTRAVENOUS
  Filled 2017-04-15: qty 0.5

## 2017-04-15 MED ORDER — ACETAMINOPHEN 650 MG RE SUPP: 650.0000 mg | Freq: Four times a day (QID) | RECTAL | Status: DC | PRN

## 2017-04-15 MED ORDER — PHENYLEPHRINE HCL 10 MG/ML IJ SOLN
INTRAMUSCULAR | Status: DC | PRN
  Administered 2017-04-15: 150 ug via INTRAVENOUS
  Administered 2017-04-15: 200 ug via INTRAVENOUS
  Administered 2017-04-15 (×2): 50 ug via INTRAVENOUS

## 2017-04-15 MED ORDER — ONDANSETRON HCL 4 MG/2ML IJ SOLN
4.0000 mg | Freq: Once | INTRAMUSCULAR | Status: AC
Start: 2017-04-15 — End: 2017-04-15
  Administered 2017-04-15: 4 mg via INTRAVENOUS
  Filled 2017-04-15: qty 2

## 2017-04-15 MED ORDER — ONDANSETRON HCL 4 MG/2ML IJ SOLN
INTRAMUSCULAR | Status: DC | PRN
  Administered 2017-04-15: 4 mg via INTRAVENOUS

## 2017-04-15 MED ORDER — ONDANSETRON HCL 4 MG/2ML IJ SOLN: 4.0000 mg | Freq: Once | INTRAMUSCULAR | Status: DC | PRN

## 2017-04-15 MED ORDER — NALOXONE HCL 0.4 MG/ML IJ SOLN: 0.4000 mg | INTRAMUSCULAR | Status: DC | PRN

## 2017-04-15 MED ORDER — MORPHINE SULFATE (PF) 2 MG/ML IV SOLN
2.0000 mg | Freq: Once | INTRAVENOUS | Status: AC
  Administered 2017-04-15: 2 mg via INTRAVENOUS
  Filled 2017-04-15: qty 1

## 2017-04-15 MED ORDER — ONDANSETRON HCL 4 MG/2ML IJ SOLN: 4.0000 mg | Freq: Four times a day (QID) | INTRAMUSCULAR | Status: DC | PRN

## 2017-04-15 MED ORDER — DEXAMETHASONE SODIUM PHOSPHATE 10 MG/ML IJ SOLN
INTRAMUSCULAR | Status: DC | PRN
  Administered 2017-04-15: 5 mg via INTRAVENOUS

## 2017-04-15 MED ORDER — ONDANSETRON HCL 4 MG/2ML IJ SOLN
4.0000 mg | Freq: Once | INTRAMUSCULAR | Status: AC
  Administered 2017-04-15: 4 mg via INTRAVENOUS
  Filled 2017-04-15: qty 2

## 2017-04-15 MED ORDER — FENTANYL CITRATE (PF) 100 MCG/2ML IJ SOLN
INTRAMUSCULAR | Status: AC
  Administered 2017-04-15: 25 ug via INTRAVENOUS
  Filled 2017-04-15: qty 2

## 2017-04-15 MED ORDER — SUGAMMADEX SODIUM 200 MG/2ML IV SOLN
INTRAVENOUS | Status: DC | PRN
  Administered 2017-04-15: 131.6 mg via INTRAVENOUS

## 2017-04-15 MED ORDER — PIPERACILLIN-TAZOBACTAM 3.375 G IVPB 30 MIN
3.3750 g | Freq: Four times a day (QID) | INTRAVENOUS | Status: AC
Start: 2017-04-15 — End: 2017-04-15
  Administered 2017-04-15: 3.375 g via INTRAVENOUS
  Filled 2017-04-15 (×2): qty 50

## 2017-04-15 MED ORDER — LIDOCAINE HCL (CARDIAC) 20 MG/ML IV SOLN
INTRAVENOUS | Status: DC | PRN
  Administered 2017-04-15: 60 mg via INTRAVENOUS

## 2017-04-15 MED ORDER — PANTOPRAZOLE SODIUM 40 MG PO TBEC
40.0000 mg | DELAYED_RELEASE_TABLET | Freq: Every day | ORAL | Status: DC
  Administered 2017-04-15 – 2017-04-19 (×5): 40 mg via ORAL
  Filled 2017-04-15 (×5): qty 1

## 2017-04-15 MED ORDER — SODIUM CHLORIDE 0.9% FLUSH: 9.0000 mL | INTRAVENOUS | Status: DC | PRN

## 2017-04-15 MED ORDER — PROCHLORPERAZINE EDISYLATE 5 MG/ML IJ SOLN
10.0000 mg | Freq: Four times a day (QID) | INTRAMUSCULAR | Status: DC | PRN
  Filled 2017-04-15: qty 2

## 2017-04-15 MED ORDER — SODIUM CHLORIDE 0.9 % IV SOLN
INTRAVENOUS | Status: DC
  Administered 2017-04-15 – 2017-04-17 (×7): via INTRAVENOUS

## 2017-04-15 MED ORDER — PROPOFOL 10 MG/ML IV BOLUS
INTRAVENOUS | Status: DC | PRN
  Administered 2017-04-15 (×2): 100 mg via INTRAVENOUS

## 2017-04-15 MED ORDER — FENTANYL CITRATE (PF) 100 MCG/2ML IJ SOLN
INTRAMUSCULAR | Status: DC | PRN
  Administered 2017-04-15 (×2): 25 ug via INTRAVENOUS
  Administered 2017-04-15 (×2): 50 ug via INTRAVENOUS
  Administered 2017-04-15: 25 ug via INTRAVENOUS
  Administered 2017-04-15: 100 ug via INTRAVENOUS
  Administered 2017-04-15: 25 ug via INTRAVENOUS

## 2017-04-15 MED ORDER — ACETAMINOPHEN 10 MG/ML IV SOLN: INTRAVENOUS | Status: DC | PRN

## 2017-04-15 MED ORDER — DOCUSATE SODIUM 100 MG PO CAPS
100.0000 mg | ORAL_CAPSULE | Freq: Two times a day (BID) | ORAL | Status: DC
  Filled 2017-04-15: qty 1

## 2017-04-15 MED ORDER — HYDROCODONE-ACETAMINOPHEN 7.5-325 MG PO TABS
1.0000 | ORAL_TABLET | Freq: Four times a day (QID) | ORAL | Status: DC | PRN
  Administered 2017-04-15 – 2017-04-17 (×2): 1 via ORAL
  Administered 2017-04-17: 2 via ORAL
  Administered 2017-04-17: 1 via ORAL
  Administered 2017-04-18 (×2): 2 via ORAL
  Filled 2017-04-15 (×2): qty 2
  Filled 2017-04-15 (×3): qty 1
  Filled 2017-04-15: qty 2

## 2017-04-15 MED ORDER — NICOTINE 21 MG/24HR TD PT24
21.0000 mg | MEDICATED_PATCH | Freq: Every day | TRANSDERMAL | Status: DC
  Administered 2017-04-16 – 2017-04-19 (×4): 21 mg via TRANSDERMAL
  Filled 2017-04-15 (×4): qty 1

## 2017-04-15 MED ORDER — PRAVASTATIN SODIUM 20 MG PO TABS
40.0000 mg | ORAL_TABLET | Freq: Every day | ORAL | Status: DC
  Administered 2017-04-16 – 2017-04-18 (×3): 40 mg via ORAL
  Filled 2017-04-15 (×3): qty 2

## 2017-04-15 MED ORDER — MIDAZOLAM HCL 5 MG/5ML IJ SOLN
INTRAMUSCULAR | Status: DC | PRN
  Administered 2017-04-15: 2 mg via INTRAVENOUS

## 2017-04-15 MED ORDER — MOMETASONE FURO-FORMOTEROL FUM 100-5 MCG/ACT IN AERO
2.0000 | INHALATION_SPRAY | Freq: Two times a day (BID) | RESPIRATORY_TRACT | Status: DC
  Administered 2017-04-16 – 2017-04-19 (×7): 2 via RESPIRATORY_TRACT
  Filled 2017-04-15 (×2): qty 8.8

## 2017-04-15 SURGICAL SUPPLY — 42 items
BULB RESERV EVAC DRAIN JP 100C (MISCELLANEOUS) IMPLANT
CANISTER SUCT 1200ML W/VALVE (MISCELLANEOUS) ×3 IMPLANT
CATH TRAY 16F METER LATEX (MISCELLANEOUS) ×3 IMPLANT
CHLORAPREP W/TINT 26ML (MISCELLANEOUS) ×3 IMPLANT
DRAIN CHANNEL JP 15F RND 16 (MISCELLANEOUS) ×3 IMPLANT
DRAPE LAPAROTOMY 100X77 ABD (DRAPES) ×3 IMPLANT
DRSG OPSITE POSTOP 4X12 (GAUZE/BANDAGES/DRESSINGS) ×3 IMPLANT
DRSG OPSITE POSTOP 4X8 (GAUZE/BANDAGES/DRESSINGS) ×3 IMPLANT
ELECT CAUTERY BLADE 6.4 (BLADE) ×3 IMPLANT
ELECT REM PT RETURN 9FT ADLT (ELECTROSURGICAL) ×3
ELECTRODE REM PT RTRN 9FT ADLT (ELECTROSURGICAL) ×1 IMPLANT
GAUZE SPONGE 4X4 12PLY STRL (GAUZE/BANDAGES/DRESSINGS) ×3 IMPLANT
GLOVE BIO SURGEON STRL SZ 6.5 (GLOVE) ×2 IMPLANT
GLOVE BIO SURGEON STRL SZ7.5 (GLOVE) ×9 IMPLANT
GLOVE BIO SURGEONS STRL SZ 6.5 (GLOVE) ×1
GLOVE INDICATOR 8.0 STRL GRN (GLOVE) ×3 IMPLANT
GOWN STRL REUS W/ TWL LRG LVL3 (GOWN DISPOSABLE) ×3 IMPLANT
GOWN STRL REUS W/TWL LRG LVL3 (GOWN DISPOSABLE) ×6
KIT OSTOMY DRAINABLE 2.75 STR (WOUND CARE) ×3 IMPLANT
KIT OSTOMY DRAINABLE 3.25 STR (WOUND CARE) ×3 IMPLANT
KIT RM TURNOVER STRD PROC AR (KITS) ×3 IMPLANT
LABEL OR SOLS (LABEL) ×3 IMPLANT
LIGASURE IMPACT 36 18CM CVD LR (INSTRUMENTS) ×3 IMPLANT
NS IRRIG 1000ML POUR BTL (IV SOLUTION) ×3 IMPLANT
PACK BASIN MAJOR ARMC (MISCELLANEOUS) IMPLANT
PACK COLON CLEAN CLOSURE (MISCELLANEOUS) ×3 IMPLANT
RELOAD PROXIMATE 75MM BLUE (ENDOMECHANICALS) ×6 IMPLANT
RETAINER VISCERA MED (MISCELLANEOUS) ×3 IMPLANT
SLEEVE SCD COMPRESS THIGH MED (MISCELLANEOUS) ×3 IMPLANT
SPONGE LAP 18X18 5 PK (GAUZE/BANDAGES/DRESSINGS) ×3 IMPLANT
STAPLER PROXIMATE 75MM BLUE (STAPLE) ×3 IMPLANT
STAPLER SKIN PROX 35W (STAPLE) ×6 IMPLANT
SUT PDS AB 1 TP1 96 (SUTURE) ×3 IMPLANT
SUT SILK 2 0 SH (SUTURE) ×3 IMPLANT
SUT SILK 2 0SH CR/8 30 (SUTURE) ×6 IMPLANT
SUT SILK 3 0 (SUTURE) ×2
SUT SILK 3-0 (SUTURE) ×2
SUT SILK 3-0 18XBRD TIE 12 (SUTURE) ×1 IMPLANT
SUT SILK 3-0 SH-1 18XCR BRD (SUTURE) ×1
SUT VIC AB 3-0 SH 27 (SUTURE) ×2
SUT VIC AB 3-0 SH 27X BRD (SUTURE) ×1 IMPLANT
SUTURE SILK 3-0 SH-1 18XCR BRD (SUTURE) ×1 IMPLANT

## 2017-04-15 NOTE — Anesthesia Procedure Notes (Signed)
Procedure Name: Intubation Date/Time: 04/15/2017 4:38 PM Performed by: Dionne Bucy Pre-anesthesia Checklist: Patient identified, Patient being monitored, Timeout performed, Emergency Drugs available and Suction available Patient Re-evaluated:Patient Re-evaluated prior to induction Oxygen Delivery Method: Circle system utilized Preoxygenation: Pre-oxygenation with 100% oxygen Induction Type: IV induction and Rapid sequence Laryngoscope Size: Mac and 3 Grade View: Grade I Tube type: Oral Tube size: 7.0 mm Number of attempts: 1 Airway Equipment and Method: Stylet Placement Confirmation: ETT inserted through vocal cords under direct vision,  positive ETCO2 and breath sounds checked- equal and bilateral Secured at: 20 cm Tube secured with: Tape Dental Injury: Teeth and Oropharynx as per pre-operative assessment

## 2017-04-15 NOTE — Transfer of Care (Signed)
Immediate Anesthesia Transfer of Care Note  Patient: Kimberly [name]  Procedure(s) Performed: Procedure(s): EXPLORATORY LAPAROTOMY (N/A)  Patient Location: PACU  Anesthesia Type:General  Level of Consciousness: awake, alert  and patient cooperative  Airway & Oxygen Therapy: Patient Spontanous Breathing and Patient connected to face mask oxygen  Post-op Assessment: Report given to RN and Post -op Vital signs reviewed and stable  Post vital signs: Reviewed and stable  Last Vitals:  Vitals:   04/15/17 1451 04/15/17 1905  BP: (!) 144/79 (!) 163/74  Pulse: 71 88  Resp: 14 14  Temp: 36.8 C     Last Pain:  Vitals:   04/15/17 1451  TempSrc: Oral  PainSc:          Complications: No apparent anesthesia complications

## 2017-04-15 NOTE — H&P (Signed)
Kimberly [name] is an 71 y.o. female.   Chief Complaint: Abdominal pain HPI: The patient with past medical history of breast cancer status post lumpectomy and radiation treatment presents emergency department complaining of abdominal pain. The patient has been taking a bowel prep for a colonoscopy scheduled this morning but had not been able to keep anything down. Her abdominal pain is colicky and cramping in character. She was recently seen by GI to follow up previous biopsy results from colonoscopy. She is found to have a palpable mass for which CT scan was ordered. It showed a nearly obstructive mass in the colon concerning for malignancy. Due to inability to maintain oral intake as well as intractable pain the emergency department staff called the hospitalist service for admission.  Past Medical History:  Diagnosis Date  . Breast cancer, left (HCC)    Lumpectomy and rad tx's.  . Chronic back pain   . Chronic knee pain   . Degenerative disc disease, lumbar 04/2013  . Hyperlipidemia   . Neuropathy   . Osteoarthritis    of knee  . Osteoporosis   . Tobacco abuse   . Vitamin D deficiency     Past Surgical History:  Procedure Laterality Date  . APPENDECTOMY  1965  . BREAST EXCISIONAL BIOPSY Left 08/01/2003   lumpectomy rad 11/04-2/28/2005  . CESAREAN SECTION     x3  . COLONOSCOPY W/ POLYPECTOMY    . ESOPHAGOGASTRODUODENOSCOPY (EGD) WITH PROPOFOL N/A 04/04/2017   Procedure: ESOPHAGOGASTRODUODENOSCOPY (EGD) WITH PROPOFOL;  Surgeon: [name], MD;  Location: [place];  Service: Endoscopy;  Laterality: N/A;  . HIP FRACTURE SURGERY Left 01/25/2012  . NECK SURGERY  12/2011  . WRIST FRACTURE SURGERY      Family History  Problem Relation Age of Onset  . Diabetes Mother        type 2  . Coronary artery disease Mother   . Deep vein thrombosis Mother   . Cancer Father   . Asthma Brother   . Diabetes Brother        type 2   Social History:  reports that she has been smoking  Cigarettes.  She has a 27.50 pack-year smoking history. She has never used smokeless tobacco. She reports that she drinks alcohol. She reports that she does not use drugs.  Allergies:  Allergies  Allergen Reactions  . Sinus Formula [Cholestatin] Nausea Only  . Iodine Other (See Comments)    Topical iodine she states "if you put it in an open sore it will cause a eating ulcer."    Medications Prior to Admission  Medication Sig Dispense Refill  . budesonide-formoterol (SYMBICORT) 80-4.5 MCG/ACT inhaler Inhale 2 puffs into the lungs 2 (two) times daily.    . Calcium Citrate-Vitamin D (CITRACAL/VITAMIN D PO) Take by mouth.    . docusate sodium (COLACE) 100 MG capsule Take 1 capsule (100 mg total) by mouth daily. 14 capsule 0  . gabapentin (NEURONTIN) 400 MG capsule Take 400 mg by mouth 3 (three) times daily.     Marland Kitchen HYDROcodone-acetaminophen (NORCO) 7.5-325 MG tablet Take 1-2 tablets by mouth every 6 (six) hours as needed for moderate pain. 120 tablet 0  . meloxicam (MOBIC) 15 MG tablet TAKE 1 TABLET EVERY DAY AS NEEDED 90 tablet 3  . naproxen (NAPROSYN) 500 MG tablet Take 500 mg by mouth 2 (two) times daily with a meal.    . omeprazole (PRILOSEC) 20 MG capsule TAKE 1 CAPSULE EVERY DAY 90 capsule 4  .  pravastatin (PRAVACHOL) 40 MG tablet TAKE 1 TABLET (40 MG TOTAL) BY MOUTH DAILY. 90 tablet 4  . Probiotic Product (PROBIOTIC DAILY PO) Take by mouth.    . Vitamin D, Ergocalciferol, (DRISDOL) 50000 units CAPS capsule Take 1 capsule (50,000 Units total) by mouth every 7 (seven) days. 12 capsule 0    Results for orders placed or performed during the hospital encounter of 04/14/17 (from the past 48 hour(s))  Lipase, blood     Status: None   Collection Time: 04/14/17 10:12 PM  Result Value Ref Range   Lipase 18 11 - 51 U/L  Comprehensive metabolic panel     Status: Abnormal   Collection Time: 04/14/17 10:12 PM  Result Value Ref Range   Sodium 136 135 - 145 mmol/L   Potassium 4.2 3.5 - 5.1 mmol/L    Chloride 97 (L) 101 - 111 mmol/L   CO2 27 22 - 32 mmol/L   Glucose, Bld 145 (H) 65 - 99 mg/dL   BUN 17 6 - 20 mg/dL   Creatinine, Ser 0.90 0.44 - 1.00 mg/dL   Calcium 8.9 8.9 - 10.3 mg/dL   Total Protein 7.6 6.5 - 8.1 g/dL   Albumin 3.4 (L) 3.5 - 5.0 g/dL   AST 22 15 - 41 U/L   ALT 18 14 - 54 U/L   Alkaline Phosphatase 134 (H) 38 - 126 U/L   Total Bilirubin 0.8 0.3 - 1.2 mg/dL   GFR calc non Af Amer >60 >60 mL/min   GFR calc Af Amer >60 >60 mL/min    Comment: (NOTE) The eGFR has been calculated using the CKD EPI equation. This calculation has not been validated in all clinical situations. eGFR's persistently <60 mL/min signify possible Chronic Kidney Disease.    Anion gap 12 5 - 15  CBC     Status: Abnormal   Collection Time: 04/14/17 10:12 PM  Result Value Ref Range   WBC 16.2 (H) 3.6 - 11.0 K/uL   RBC 5.76 (H) 3.80 - 5.20 MIL/uL   Hemoglobin 16.5 (H) 12.0 - 16.0 g/dL   HCT 49.0 (H) 35.0 - 47.0 %   MCV 85.2 80.0 - 100.0 fL   MCH 28.7 26.0 - 34.0 pg   MCHC 33.7 32.0 - 36.0 g/dL   RDW 19.0 (H) 11.5 - 14.5 %   Platelets 569 (H) 150 - 440 K/uL  Urinalysis, Complete w Microscopic     Status: Abnormal   Collection Time: 04/15/17  1:24 AM  Result Value Ref Range   Color, Urine YELLOW (A) YELLOW   APPearance HAZY (A) CLEAR   Specific Gravity, Urine 1.026 1.005 - 1.030   pH 5.0 5.0 - 8.0   Glucose, UA NEGATIVE NEGATIVE mg/dL   Hgb urine dipstick NEGATIVE NEGATIVE   Bilirubin Urine NEGATIVE NEGATIVE   Ketones, ur 20 (A) NEGATIVE mg/dL   Protein, ur 30 (A) NEGATIVE mg/dL   Nitrite NEGATIVE NEGATIVE   Leukocytes, UA NEGATIVE NEGATIVE   RBC / HPF 0-5 0 - 5 RBC/hpf   WBC, UA 0-5 0 - 5 WBC/hpf   Bacteria, UA RARE (A) NONE SEEN   Squamous Epithelial / LPF 0-5 (A) NONE SEEN   Mucous PRESENT    Hyaline Casts, UA PRESENT    Dg Abdomen Acute W/chest  Result Date: 04/14/2017 CLINICAL DATA:  Initial evaluation for acute abdominal pain. EXAM: DG ABDOMEN ACUTE W/ 1V CHEST  COMPARISON:  Prior CT from 04/08/2017. FINDINGS: Cardiac and mediastinal silhouettes are within normal limits. Aortic atherosclerosis.  Lungs are mildly hypoinflated. Chronic coarsening of the interstitial markings. No focal infiltrates. No pulmonary edema or pleural effusion. No pneumothorax. Multiple prominent dilated gas-filled loops of bowel seen scattered throughout the abdomen. Air-fluid levels seen on upright projection. Paucity of gas overlies the distal bowel. Few decompressed contrast filled loops of bowel are overlie the pelvis. Findings concerning for persistent obstructive process, likely similar to previous CT. No free air. No soft tissue mass or abnormal calcification. No acute osseus abnormality. Degenerative changes noted within the spine. Fixation hardware noted at the left hip. IMPRESSION: 1. Multiple dilated loops of bowel scattered throughout the abdomen with associated internal air-fluid levels, consistent with ongoing/persistent obstructive process, likely similar relative to recent CT from 04/08/2017. 2. No active cardiopulmonary disease. 3. Aortic atherosclerosis. Electronically Signed   By: Jeannine Boga M.D.   On: 04/14/2017 23:19    Review of Systems  Constitutional: Negative for chills and fever.  HENT: Negative for sore throat and tinnitus.   Eyes: Negative for blurred vision and redness.  Respiratory: Negative for cough and shortness of breath.   Cardiovascular: Negative for chest pain, palpitations, orthopnea and PND.  Gastrointestinal: Negative for abdominal pain, diarrhea, nausea and vomiting.  Genitourinary: Negative for dysuria, frequency and urgency.  Musculoskeletal: Negative for joint pain and myalgias.  Skin: Negative for rash.       No lesions  Neurological: Negative for speech change, focal weakness and weakness.  Endo/Heme/Allergies: Does not bruise/bleed easily.       No temperature intolerance  Psychiatric/Behavioral: Negative for depression and  suicidal ideas.    Blood pressure (!) 160/96, pulse 87, temperature 98.7 F (37.1 C), temperature source Oral, resp. rate 20, height '5\' 4"'  (1.626 m), weight 65.8 kg (145 lb), SpO2 97 %. Physical Exam  Vitals reviewed. Constitutional: She is oriented to person, place, and time. She appears well-developed and well-nourished.  HENT:  Head: Normocephalic and atraumatic.  Mouth/Throat: Oropharynx is clear and moist.  Eyes: Pupils are equal, round, and reactive to light. Conjunctivae and EOM are normal. No scleral icterus.  Neck: Normal range of motion. Neck supple. No JVD present. No tracheal deviation present. No thyromegaly present.  Cardiovascular: Normal rate, regular rhythm and normal heart sounds.  Exam reveals no gallop and no friction rub.   No murmur heard. Respiratory: Effort normal and breath sounds normal.  GI: Soft. Bowel sounds are normal. She exhibits distension. She exhibits no mass. There is tenderness. There is guarding. There is no rebound.  Genitourinary:  Genitourinary Comments: Deferred  Musculoskeletal: Normal range of motion. She exhibits no edema.  Lymphadenopathy:    She has no cervical adenopathy.  Neurological: She is alert and oriented to person, place, and time. No cranial nerve deficit. She exhibits normal muscle tone.  Skin: Skin is warm and dry. No rash noted. No erythema.  Psychiatric: She has a normal mood and affect. Her behavior is normal. Judgment and thought content normal.     Assessment/Plan This is a 71 year old female admitted for colon mass. 1. Colon mass: Obstructive. Gastroenterology consulted for possible excisional biopsy. May need to consult surgery. 2. Leukocytosis: No indication of infection. Likely secondary to light and process. 3. Tobacco abuse: NicoDerm patch if the patient requests 4. Hyperlipidemia: Continue statin therapy 5. DVT prophylaxis: SCDs 6. GI prophylaxis: Pantoprazole per home regimen The patient is a full code. Time  spent on admission orders and patient care possibly 45 minutes  Harrie Foreman, MD 04/15/2017, 8:01 AM

## 2017-04-15 NOTE — Anesthesia Postprocedure Evaluation (Signed)
Anesthesia Post Note  Patient: Kimberly [name]  Procedure(s) Performed: Procedure(s) (LRB): EXPLORATORY LAPAROTOMY (N/A)  Patient location during evaluation: PACU Anesthesia Type: General Level of consciousness: awake and alert Pain management: pain level controlled Vital Signs Assessment: post-procedure vital signs reviewed and stable Respiratory status: spontaneous breathing, nonlabored ventilation, respiratory function stable and patient connected to nasal cannula oxygen Cardiovascular status: blood pressure returned to baseline and stable Postop Assessment: no signs of nausea or vomiting Anesthetic complications: no     Last Vitals:  Vitals:   04/15/17 2020 04/15/17 2032  BP:  (!) 144/77  Pulse:  80  Resp: 14 17  Temp:  36.4 C    Last Pain:  Vitals:   04/15/17 2032  TempSrc: Oral  PainSc:                  [name] S

## 2017-04-15 NOTE — Consult Note (Signed)
Patient ID: Kimberly [name], female   DOB: 1946-04-08, 71 y.o.   MRN: [id number]  CC: Abdominal pain  HPI Kimberly [name] is a 71 y.o. female who is currently admitted to the medicine service. She came in the hospital with abdominal pain, nausea, vomiting secondary to attempt to take a bowel prep for colonoscopy. She has a known transverse colon mass that she was attempting to get tissue diagnosis. After starting her prep she became violently ill which prompted her to come the emergency room. She states she's been feeling not well for the last several months but has been progressively getting weaker and having weight loss. She does have a personal history of breast cancer many years ago. Her last colonoscopy was 2 years ago which did show 2 small transverse colon polyps that were removed at that time. She currently denies any fevers, chills, chest pain, shortness of breath, diarrhea, constipation. She is otherwise in her usual state of health.  HPI  Past Medical History:  Diagnosis Date  . Breast cancer, left (HCC)    Lumpectomy and rad tx's.  . Chronic back pain   . Chronic knee pain   . Degenerative disc disease, lumbar 04/2013  . Hyperlipidemia   . Neuropathy   . Osteoarthritis    of knee  . Osteoporosis   . Tobacco abuse   . Vitamin D deficiency     Past Surgical History:  Procedure Laterality Date  . APPENDECTOMY  1965  . BREAST EXCISIONAL BIOPSY Left 08/01/2003   lumpectomy rad 11/04-2/28/2005  . CESAREAN SECTION     x3  . COLONOSCOPY W/ POLYPECTOMY    . ESOPHAGOGASTRODUODENOSCOPY (EGD) WITH PROPOFOL N/A 04/04/2017   Procedure: ESOPHAGOGASTRODUODENOSCOPY (EGD) WITH PROPOFOL;  Surgeon: [name], MD;  Location: [place];  Service: Endoscopy;  Laterality: N/A;  . HIP FRACTURE SURGERY Left 01/25/2012  . NECK SURGERY  12/2011  . WRIST FRACTURE SURGERY      Family History  Problem Relation Age of Onset  . Diabetes Mother        type 2  . Coronary artery disease Mother    . Deep vein thrombosis Mother   . Cancer Father   . Asthma Brother   . Diabetes Brother        type 2    Social History Social History  Substance Use Topics  . Smoking status: Current Every Day Smoker    Packs/day: 0.50    Years: 55.00    Types: Cigarettes  . Smokeless tobacco: Never Used     Comment: started age 34 1/3 to 1 ppd  . Alcohol use 0.0 oz/week     Comment: occasionally drinks beer    Allergies  Allergen Reactions  . Sinus Formula [Cholestatin] Nausea Only  . Iodine Other (See Comments)    Topical iodine she states "if you put it in an open sore it will cause a eating ulcer."    Current Facility-Administered Medications  Medication Dose Route Frequency Provider Last Rate Last Dose  . 0.9 %  sodium chloride infusion   Intravenous Continuous Harrie Foreman, MD 125 mL/hr at 04/15/17 1123    . acetaminophen (TYLENOL) tablet 650 mg  650 mg Oral Q6H PRN Harrie Foreman, MD       Or  . acetaminophen (TYLENOL) suppository 650 mg  650 mg Rectal Q6H PRN Harrie Foreman, MD      . docusate sodium (COLACE) capsule 100 mg  100 mg Oral BID Rosilyn Mings  S, MD      . gabapentin (NEURONTIN) capsule 400 mg  400 mg Oral TID Harrie Foreman, MD   400 mg at 04/15/17 1124  . HYDROcodone-acetaminophen (NORCO) 7.5-325 MG per tablet 1-2 tablet  1-2 tablet Oral Q6H PRN Harrie Foreman, MD   1 tablet at 04/15/17 1348  . mometasone-formoterol (DULERA) 100-5 MCG/ACT inhaler 2 puff  2 puff Inhalation BID Harrie Foreman, MD      . ondansetron [facility]) tablet 4 mg  4 mg Oral Q6H PRN Harrie Foreman, MD       Or  . ondansetron [facility]) injection 4 mg  4 mg Intravenous Q6H PRN Harrie Foreman, MD      . pantoprazole (PROTONIX) EC tablet 40 mg  40 mg Oral Daily Harrie Foreman, MD   40 mg at 04/15/17 1124  . [START ON 04/16/2017] piperacillin-tazobactam (ZOSYN) IVPB 3.375 g  3.375 g Intravenous On Call to OR Clayburn Pert, MD      . pravastatin (PRAVACHOL)  tablet 40 mg  40 mg Oral q1800 Harrie Foreman, MD      . prochlorperazine (COMPAZINE) injection 10 mg  10 mg Intravenous Q6H PRN Harrie Foreman, MD      . Vitamin D (Ergocalciferol) (DRISDOL) capsule 50,000 Units  50,000 Units Oral Q7 days Harrie Foreman, MD         Review of Systems A Multi-point review of systems was asked and was negative except for the findings documented in the history of present illness  Physical Exam Blood pressure (!) 144/79, pulse 71, temperature 98.3 F (36.8 C), temperature source Oral, resp. rate 14, height 5\' 4"  (1.626 m), weight 65.8 kg (145 lb), SpO2 98 %. CONSTITUTIONAL: No acute distress. EYES: Pupils are equal, round, and reactive to light, Sclera are non-icteric. EARS, NOSE, MOUTH AND THROAT: The oropharynx is clear. The oral mucosa is pink and moist. Hearing is intact to voice. LYMPH NODES:  Lymph nodes in the neck are normal. RESPIRATORY:  Lungs are clear. There is normal respiratory effort, with equal breath sounds bilaterally, and without pathologic use of accessory muscles. CARDIOVASCULAR: Heart is regular without murmurs, gallops, or rubs. GI: The abdomen is soft, moderately tender to deep palpation in the upper midline, and mildly distended. There are no palpable masses. There is no hepatosplenomegaly. There are normal bowel sounds in all quadrants. GU: Rectal deferred.   MUSCULOSKELETAL: Normal muscle strength and tone. No cyanosis or edema.   SKIN: Turgor is good and there are no pathologic skin lesions or ulcers. NEUROLOGIC: Motor and sensation is grossly normal. Cranial nerves are grossly intact. PSYCH:  Oriented to person, place and time. Affect is normal.  Data Reviewed Images and labs reviewed, labs showed leukocytosis of 16.2, mild decrease in chloride 97, mild increase in alcohol phosphatase 134 but are otherwise within normal limits. Plain film x-ray shows dilated loops of bowel consistent with a bowel obstruction. CT scan  from 1 week ago shows a transverse colon mass with a proximal obstruction consistent with the current x-ray. I have personally reviewed the patient's imaging, laboratory findings and medical records.    Assessment    Obstructing colon mass    Plan    71 year old female with an obstructing colon mass. This is likely cancer based on presentation. However given the lack of tissue diagnosis discussed with the patient and her family that it is unable to be fully characterized until it is visualized and removed. Discussed the surgery  would consist of exploring the abdomen and attempting to remove the cancer causing the blockage. Given the proximal obstruction discussed the likelihood of having an ostomy created. However, discussed should it be safely possible an anastomosis might be attempted. Patient and family voiced understanding and desired to proceed to the operating room. Plan to proceed the operating room as soon as the OR is ready for export her laparotomy with hemicolectomy and likely ostomy creation.     Time spent with the patient was 80 minutes, with more than 50% of the time spent in face-to-face education, counseling and care coordination.     Clayburn Pert, MD FACS General Surgeon 04/15/2017, 2:51 PM

## 2017-04-15 NOTE — Care Management Obs Status (Signed)
[place] NOTIFICATION   Patient Details  Name: LEATHIA FARNELL MRN: [id number] Date of Birth: 10-19-1945   Medicare Observation Status Notification Given:  No (patient in [place])    Beverly Sessions, RN 04/15/2017, 4:58 PM

## 2017-04-15 NOTE — Progress Notes (Addendum)
Initial Nutrition Assessment  DOCUMENTATION CODES:   Not applicable  INTERVENTION:   RD will order supplements when diet advanced. Pt likes chocolate Ensure.  NUTRITION DIAGNOSIS:   Inadequate oral intake related to acute illness as evidenced by NPO status  GOAL:   Patient will meet greater than or equal to 90% of their needs  MONITOR:   Diet advancement, Labs, Weight trends  REASON FOR ASSESSMENT:   Malnutrition Screening Tool    ASSESSMENT:    71 y/o female with past medical history of breast cancer status post lumpectomy and radiation treatment presents with severe abdominal pain, nausea and vomiting while undergoing bowel prep for colonoscopy to evaluate colon mass.   Met with pt in room today. Pt reports poor appetite and oral intake for several months pta. Pt reports that she just does not have an appetite and food tastes bad. Per chart, pt has lost 20lbs(12%) in two months; this is severe. Pt noted to have colon mass on CT scan from 7/3 and was scheduled to have a colonoscopy tomorrow; pt was unable to get the bowel prep down r/t N/V and was hospitalized for bowel obstruction. Pt currently NPO for possible surgery today. RD will order supplements when diet advanced.  Medications reviewed and include: colace, protonix, Vit D  Labs reviewed: CL 97(L), AlkPhos 134(H), Alb 3.4(L) Wbc- 16.2(H)  Nutrition-Focused physical exam completed. Findings are no fat depletion, no muscle depletion, and no edema.   Diet Order:  Diet NPO time specified Except for: Sips with Meds  Skin:  Reviewed, no issues  Last BM:  7/16  Height:   Ht Readings from Last 1 Encounters:  04/14/17 '5\' 4"'  (1.626 m)    Weight:   Wt Readings from Last 1 Encounters:  04/14/17 145 lb (65.8 kg)    Ideal Body Weight:  54.5 kg  BMI:  Body mass index is 24.89 kg/m.  Estimated Nutritional Needs:   Kcal:  1600-1900kcal/day   Protein:  66-79g/day   Fluid:  >1.6L/day   EDUCATION NEEDS:    Education needs addressed  Koleen Distance MS, RD, LDN Pager #[phone] After Hours Pager: [phone]

## 2017-04-15 NOTE — Brief Op Note (Signed)
04/14/2017 - 04/15/2017  7:06 PM  PATIENT:  Kimberly [name]  71 y.o. female  PRE-OPERATIVE DIAGNOSIS:  Colon Mass  POST-OPERATIVE DIAGNOSIS:  Colon Mass  PROCEDURE:  Procedure(s): EXPLORATORY LAPAROTOMY (N/A)  SURGEON:  Surgeon(s) and Role:    * Clayburn Pert, MD - Primary  PHYSICIAN ASSISTANT:   ASSISTANTS: none   ANESTHESIA:   general  EBL:  No intake/output data recorded.  BLOOD ADMINISTERED:none  DRAINS: none   LOCAL MEDICATIONS USED:  NONE  SPECIMEN:  Source of Specimen:  transverse colon  DISPOSITION OF SPECIMEN:  PATHOLOGY  COUNTS:  YES  TOURNIQUET:  * No tourniquets in log *  DICTATION: .Dragon Dictation  PLAN OF CARE: Admit to inpatient   PATIENT DISPOSITION:  PACU - hemodynamically stable.   Delay start of Pharmacological VTE agent (>24hrs) due to surgical blood loss or risk of bleeding: no

## 2017-04-15 NOTE — Anesthesia Preprocedure Evaluation (Signed)
Anesthesia Evaluation  Patient identified by MRN, date of birth, ID band Patient awake    Reviewed: Allergy & Precautions, NPO status , Patient's Chart, lab work & pertinent test results, reviewed documented beta blocker date and time   Airway Mallampati: II  TM Distance: >3 FB     Dental  (+) Chipped   Pulmonary Current Smoker,           Cardiovascular + CAD       Neuro/Psych    GI/Hepatic   Endo/Other    Renal/GU      Musculoskeletal  (+) Arthritis ,   Abdominal   Peds  Hematology   Anesthesia Other Findings   Reproductive/Obstetrics                             Anesthesia Physical Anesthesia Plan  ASA: III  Anesthesia Plan: General   Post-op Pain Management:    Induction: Intravenous  PONV Risk Score and Plan:   Airway Management Planned: Oral ETT  Additional Equipment:   Intra-op Plan:   Post-operative Plan:   Informed Consent: I have reviewed the patients History and Physical, chart, labs and discussed the procedure including the risks, benefits and alternatives for the proposed anesthesia with the patient or authorized representative who has indicated his/her understanding and acceptance.     Plan Discussed with: CRNA  Anesthesia Plan Comments:         Anesthesia Quick Evaluation

## 2017-04-15 NOTE — Op Note (Signed)
Pre-operative Diagnosis: Obstructing colon mass  Post-operative Diagnosis: Obstructing transverse colon mass with palpable mesenteric masses  Procedure performed: Exploratory laparotomy, transverse colectomy, mobilization of hepatic flexure, end colostomy and mucous fistula creation  Surgeon: Clayburn Pert   Assistants: None  Anesthesia: General endotracheal anesthesia  ASA Class: 3  Surgeon: Clayburn Pert, MD FACS  Anesthesia: Gen. with endotracheal tube  Assistant: None  Procedure Details  The patient was seen again in the Holding Room. The benefits, complications, treatment options, and expected outcomes were discussed with the patient. The risks of bleeding, infection, recurrence of symptoms, failure to resolve symptoms,  bowel injury, any of which could require further surgery were reviewed with the patient.   The patient was taken to Operating Room, identified as [name] and the procedure verified.  A Time Out was held and the above information confirmed.  Prior to the induction of general anesthesia, antibiotic prophylaxis was administered. VTE prophylaxis was in place. General endotracheal anesthesia was then administered and tolerated well. After the induction, the abdomen was prepped with Chloraprep and draped in the sterile fashion. The patient was positioned in the supine position.  The procedure began with a midline incision with a 10 blade scalpel. Using Bovie much cautery dissection of the level of the fascia. The fascia and peritoneum were sharply entered into with Metzenbaum scissors and digitally protecting the deeper tissues the fascia was opened over the entire length of the incision. Dilated small bowel immediately erupted in the midline. The entire abdomen was explored and the small bowel run from the ligament Treitz to the ileocecal valve. The large bowel was explored as well, the proximal descending colon to the proximal transverse colon were extremely  dilated leading to a hard palpable mass in the mid transverse colon. There are numerous palpable densities in the mesentery around the mass. The distal transverse colon and descending and sigmoid colon were noted to be decompressed and soft. There was no obvious palpable or visible hepatic lesions. The stomach was palpated and an NG tube was guided into the mid stomach.  At this point the decision was made to perform a transverse colectomy as there is no tissue diagnosis for the tumor. An area approximately 5 cm distal to the palpable mass was chosen and a mesenteric window was created with electrocautery. Using a 75 mm blue load GIA stapler the bowel was excised. The proximal large bowel was palpated and visualized. An area proximal to a palpable mass was chosen approximately 15 cm proximal to the obstructing mass. Again a mesenteric window was made with a cautery and the colon was transected with the aforementioned stapler. Using a LigaSure device the mesentery of the large bowel was excised and the transverse colectomy was completed. The specimen was then passed off the field for permanent section.  Due to the large size mismatch of the proximal and distal colon the decision was made to create an end colostomy and mucous fistula. Both ends of the colon were circumferentially cleared of fatty attachments with electrocautery and mobilized. The hepatic flexure was taken down with cautery to allow adequate mobilization of the proximal transverse colon. Skin incisions for the end colostomy and mucous fistula were chosen and created with electrocautery. Both ends of colon were easily pulled through the abdominal wall. The abdominal contents were again visualized and there is no evidence of continued bleeding.  We then proceeded to a clean closure. All members of the operating room staff broke scrub, re-scrubbed, gowned. Sterile drapes  were reapplied and all sterile insurance for use. The midline fascial incision  was closed with 2 running looped #1 PDS sutures protecting the abdominal contents with the fish. The fish was easily removed after the entire fascia was closed and secured. The skin was then closed with surgical staples. The mucous fistula was developed first in a standard Brooke fashion. 4 cardinal directions fascial sutures were placed with 2-0 silk suture. An circumferential sutures were used to mature the mucous fistula after the staple line was sharply excised. There was visible hard stool within the mucous fistula. We then turned our attention to the end colostomy. Again cardinal direction fascial sutures were placed with 2-0 silk. When the staple line was sharply excised the obstructed liquid stool erupted from the colon and the large bowel was decompressed with suction. The end colostomy was then matured in a circumferential Brooke fashion with ease. Ostomy appliances were applied over both sites and a honeycomb dressing was placed over the midline.  The patient tolerated the procedure well. There were no immediate complications and all counts were correct at the end of the procedure. She was awoken from general endotracheal anesthesia and transferred to the PACU in good condition.  Findings: Obstructing colon mass   Estimated Blood Loss: 50 mL         Drains: None         Specimens: Transverse colon          Complications: None                  Condition: Good   Clayburn Pert, MD, FACS

## 2017-04-15 NOTE — Progress Notes (Signed)
POD # 0 VSS Good UO  PE NAD, resting comfortable Abd: soft, end colosstomy w some liquid feces, mucous fistula viable and patent. Dressing intact  A/p Doing well No surgical complications

## 2017-04-15 NOTE — Consult Note (Signed)
Kimberly Lame, MD [facility]  [address] [place], [place] [address] Phone: [phone] Fax : [phone]  Consultation  Referring Provider:     Marcille Blanco Primary Care Physician:  Birdie Sons, MD Primary Gastroenterologist:  Dr. Allen Norris         Reason for Consultation:     Colonic obstruction  Date of Admission:  04/14/2017 Date of Consultation:  04/15/2017         HPI:   Kimberly [name] is a 71 y.o. female who was seen by me in the office for post prandial pain with bloating. The patient underwent upper endoscopy that did not show any cause for her symptoms. The patient had a colonoscopy in 2016 with 2 small polyps in the transverse colon. The patient continued to lose weight and came back to see me. The patient was then sent for CT scan that showed a mass in the transverse colon with what appeared to be involved lymph nodes. The patient also has a history of breast cancer. The patient was set up for a outpatient colonoscopy to be done today but the patient took the prep yesterday without any results and nausea and vomiting obstructive symptoms so she came to the emergency room. The patient was found to have an obstruction in her transverse colon with dilated proximal intestines.  Past Medical History:  Diagnosis Date  . Breast cancer, left (HCC)    Lumpectomy and rad tx's.  . Chronic back pain   . Chronic knee pain   . Degenerative disc disease, lumbar 04/2013  . Hyperlipidemia   . Neuropathy   . Osteoarthritis    of knee  . Osteoporosis   . Tobacco abuse   . Vitamin D deficiency     Past Surgical History:  Procedure Laterality Date  . APPENDECTOMY  1965  . BREAST EXCISIONAL BIOPSY Left 08/01/2003   lumpectomy rad 11/04-2/28/2005  . CESAREAN SECTION     x3  . COLONOSCOPY W/ POLYPECTOMY    . ESOPHAGOGASTRODUODENOSCOPY (EGD) WITH PROPOFOL N/A 04/04/2017   Procedure: ESOPHAGOGASTRODUODENOSCOPY (EGD) WITH PROPOFOL;  Surgeon: Kimberly Lame, MD;  Location: [facility];   Service: Endoscopy;  Laterality: N/A;  . HIP FRACTURE SURGERY Left 01/25/2012  . NECK SURGERY  12/2011  . WRIST FRACTURE SURGERY      Prior to Admission medications   Medication Sig Start Date End Date Taking? Authorizing Provider  budesonide-formoterol (SYMBICORT) 80-4.5 MCG/ACT inhaler Inhale 2 puffs into the lungs 2 (two) times daily.   Yes [provider]  Calcium Citrate-Vitamin D (CITRACAL/VITAMIN D PO) Take by mouth.   Yes [provider]  docusate sodium (COLACE) 100 MG capsule Take 1 capsule (100 mg total) by mouth daily. 01/01/17  Yes Trinna Post, PA-C  gabapentin (NEURONTIN) 400 MG capsule Take 400 mg by mouth 3 (three) times daily.    Yes [provider]  HYDROcodone-acetaminophen (NORCO) 7.5-325 MG tablet Take 1-2 tablets by mouth every 6 (six) hours as needed for moderate pain. 03/20/17  Yes Birdie Sons, MD  meloxicam (MOBIC) 15 MG tablet TAKE 1 TABLET EVERY DAY AS NEEDED 11/09/16  Yes Birdie Sons, MD  naproxen (NAPROSYN) 500 MG tablet Take 500 mg by mouth 2 (two) times daily with a meal.   Yes [provider]  omeprazole (PRILOSEC) 20 MG capsule TAKE 1 CAPSULE EVERY DAY 11/09/16  Yes Birdie Sons, MD  pravastatin (PRAVACHOL) 40 MG tablet TAKE 1 TABLET (40 MG TOTAL) BY MOUTH DAILY.  02/17/17  Yes Birdie Sons, MD  Probiotic Product (PROBIOTIC DAILY PO) Take by mouth.   Yes [provider]  Vitamin D, Ergocalciferol, (DRISDOL) 50000 units CAPS capsule Take 1 capsule (50,000 Units total) by mouth every 7 (seven) days. 11/10/15  Yes Birdie Sons, MD    Family History  Problem Relation Age of Onset  . Diabetes Mother        type 2  . Coronary artery disease Mother   . Deep vein thrombosis Mother   . Cancer Father   . Asthma Brother   . Diabetes Brother        type 2     Social History  Substance Use Topics  . Smoking status: Current Every Day Smoker    Packs/day: 0.50    Years: 55.00    Types: Cigarettes   . Smokeless tobacco: Never Used     Comment: started age 78 1/2 to 1 ppd  . Alcohol use 0.0 oz/week     Comment: occasionally drinks beer    Allergies as of 04/14/2017 - Review Complete 04/14/2017  Allergen Reaction Noted  . Sinus formula [cholestatin] Nausea Only 04/13/2015  . Iodine Other (See Comments) 04/13/2015    Review of Systems:    All systems reviewed and negative except where noted in HPI.   Physical Exam:  Vital signs in last 24 hours: Temp:  [98.2 F (36.8 C)-98.7 F (37.1 C)] 98.7 F (37.1 C) (07/17 0321) Pulse Rate:  [83-108] 87 (07/17 0321) Resp:  [19-20] 20 (07/17 0321) BP: (131-160)/(76-101) 160/96 (07/17 0321) SpO2:  [88 %-97 %] 97 % (07/17 0321) Weight:  [145 lb (65.8 kg)] 145 lb (65.8 kg) (07/16 2209) Last BM Date: 04/14/17 General:   Pleasant, cooperative in NAD Head:  Normocephalic and atraumatic. Eyes:   No icterus.   Conjunctiva pink. PERRLA. Ears:  Normal auditory acuity. Neck:  Supple; no masses or thyroidomegaly Lungs: Respirations even and unlabored. Lungs clear to auscultation bilaterally.   No wheezes, crackles, or rhonchi.  Heart:  Regular rate and rhythm;  Without murmur, clicks, rubs or gallops Abdomen:  Soft, distended with diffuse mild tenderness. Normal bowel sounds. No appreciable masses or hepatomegaly.  No rebound or guarding.  Rectal:  Not performed. Msk:  Symmetrical without gross deformities.    Extremities:  Without edema, cyanosis or clubbing. Neurologic:  Alert and oriented x3;  grossly normal neurologically. Skin:  Intact without significant lesions or rashes. Cervical Nodes:  No significant cervical adenopathy. Psych:  Alert and cooperative. Normal affect.  LAB RESULTS:  Recent Labs  04/14/17 2212  WBC 16.2*  HGB 16.5*  HCT 49.0*  PLT 569*   BMET  Recent Labs  04/14/17 2212  NA 136  K 4.2  CL 97*  CO2 27  GLUCOSE 145*  BUN 17  CREATININE 0.90  CALCIUM 8.9   LFT  Recent Labs  04/14/17 2212  PROT  7.6  ALBUMIN 3.4*  AST 22  ALT 18  ALKPHOS 134*  BILITOT 0.8   PT/INR No results for input(s): LABPROT, INR in the last 72 hours.  STUDIES: Dg Abdomen Acute W/chest  Result Date: 04/14/2017 CLINICAL DATA:  Initial evaluation for acute abdominal pain. EXAM: DG ABDOMEN ACUTE W/ 1V CHEST COMPARISON:  Prior CT from 04/08/2017. FINDINGS: Cardiac and mediastinal silhouettes are within normal limits. Aortic atherosclerosis. Lungs are mildly hypoinflated. Chronic coarsening of the interstitial markings. No focal infiltrates. No pulmonary edema or pleural effusion. No pneumothorax. Multiple prominent dilated gas-filled loops of  bowel seen scattered throughout the abdomen. Air-fluid levels seen on upright projection. Paucity of gas overlies the distal bowel. Few decompressed contrast filled loops of bowel are overlie the pelvis. Findings concerning for persistent obstructive process, likely similar to previous CT. No free air. No soft tissue mass or abnormal calcification. No acute osseus abnormality. Degenerative changes noted within the spine. Fixation hardware noted at the left hip. IMPRESSION: 1. Multiple dilated loops of bowel scattered throughout the abdomen with associated internal air-fluid levels, consistent with ongoing/persistent obstructive process, likely similar relative to recent CT from 04/08/2017. 2. No active cardiopulmonary disease. 3. Aortic atherosclerosis. Electronically Signed   By: Jeannine Boga M.D.   On: 04/14/2017 23:19      Impression / Plan:   VALLEY KE is a 71 y.o. y/o female with an obstructing transverse colon mass with involvement of lymph nodes. The patient did have a colonoscopy 2 years ago without the mass being present at that time but did have 2 small polyps in the transverse colon that were removed. With the patient's history of breast cancer and her CT scan findings this could be a primary colon cancer versus an extrinsic compression from a metastatic  lesion. Without the patient being able to take a prep she has been explained that surgery may be the only option with a diversion versus a diversion with a colectomy and removal of the mass. I have put a call out to surgery to discuss surgical options. Less likely to be beneficial is giving enemas and doing a colonoscopy for tissue biopsy since the lesion is in the transverse colon that is unlikely to clean out the colon with just enemas. The patient has been explained the plan and agrees with it.  Thank you for involving me in the care of this patient.      LOS: 0 days   Kimberly Lame, MD  04/15/2017, 11:26 AM   Note: This dictation was prepared with Dragon dictation along with smaller phrase technology. Any transcriptional errors that result from this process are unintentional.

## 2017-04-15 NOTE — Anesthesia Post-op Follow-up Note (Cosign Needed)
Anesthesia QCDR form completed.        

## 2017-04-15 NOTE — Progress Notes (Addendum)
[place] [facility] [place] NAME: Kimberly [name]    MR#:  [id number]  DATE OF BIRTH:  Feb 20, 1946  SUBJECTIVE:   Came in with vomiting after trying to get the colon prep done. Abdominal pain, weight loss REVIEW OF SYSTEMS:   Review of Systems  Constitutional: Positive for weight loss. Negative for chills and fever.  HENT: Negative for ear discharge, ear pain and nosebleeds.   Eyes: Negative for blurred vision, pain and discharge.  Respiratory: Negative for sputum production, shortness of breath, wheezing and stridor.   Cardiovascular: Negative for chest pain, palpitations, orthopnea and PND.  Gastrointestinal: Positive for abdominal pain and vomiting. Negative for diarrhea and nausea.  Genitourinary: Negative for frequency and urgency.  Musculoskeletal: Negative for back pain and joint pain.  Neurological: Positive for weakness. Negative for sensory change, speech change and focal weakness.  Psychiatric/Behavioral: Negative for depression and hallucinations. The patient is not nervous/anxious.    Tolerating Diet: Tolerating PT:   DRUG ALLERGIES:   Allergies  Allergen Reactions  . Sinus Formula [Cholestatin] Nausea Only  . Iodine Other (See Comments)    Topical iodine she states "if you put it in an open sore it will cause a eating ulcer."    VITALS:  Blood pressure (!) 144/79, pulse 71, temperature 98.3 F (36.8 C), temperature source Oral, resp. rate 14, height 5\' 4"  (1.626 m), weight 65.8 kg (145 lb), SpO2 98 %.  PHYSICAL EXAMINATION:   Physical Exam  GENERAL:  71 y.o.-year-old patient lying in the bed with no acute distress.  EYES: Pupils equal, round, reactive to light and accommodation. No scleral icterus. Extraocular muscles intact.  HEENT: Head atraumatic, normocephalic. Oropharynx and nasopharynx clear.  NECK:  Supple, no jugular venous distention. No thyroid enlargement, no tenderness.  LUNGS: Normal breath sounds  bilaterally, no wheezing, rales, rhonchi. No use of accessory muscles of respiration.  CARDIOVASCULAR: S1, S2 normal. No murmurs, rubs, or gallops.  ABDOMEN: Soft, diffusely tender, distended. Bowel sounds present. No organomegaly or mass.  EXTREMITIES: No cyanosis, clubbing or edema b/l.    NEUROLOGIC: Cranial nerves II through XII are intact. No focal Motor or sensory deficits b/l.   PSYCHIATRIC:  patient is alert and oriented x 3.  SKIN: No obvious rash, lesion, or ulcer.   LABORATORY PANEL:  CBC  Recent Labs Lab 04/14/17 2212  WBC 16.2*  HGB 16.5*  HCT 49.0*  PLT 569*    Chemistries   Recent Labs Lab 04/14/17 2212  NA 136  K 4.2  CL 97*  CO2 27  GLUCOSE 145*  BUN 17  CREATININE 0.90  CALCIUM 8.9  AST 22  ALT 18  ALKPHOS 134*  BILITOT 0.8   Cardiac Enzymes No results for input(s): TROPONINI in the last 168 hours. RADIOLOGY:  Dg Abdomen Acute W/chest  Result Date: 04/14/2017 CLINICAL DATA:  Initial evaluation for acute abdominal pain. EXAM: DG ABDOMEN ACUTE W/ 1V CHEST COMPARISON:  Prior CT from 04/08/2017. FINDINGS: Cardiac and mediastinal silhouettes are within normal limits. Aortic atherosclerosis. Lungs are mildly hypoinflated. Chronic coarsening of the interstitial markings. No focal infiltrates. No pulmonary edema or pleural effusion. No pneumothorax. Multiple prominent dilated gas-filled loops of bowel seen scattered throughout the abdomen. Air-fluid levels seen on upright projection. Paucity of gas overlies the distal bowel. Few decompressed contrast filled loops of bowel are overlie the pelvis. Findings concerning for persistent obstructive process, likely similar to previous CT. No free air. No soft tissue mass or abnormal  calcification. No acute osseus abnormality. Degenerative changes noted within the spine. Fixation hardware noted at the left hip. IMPRESSION: 1. Multiple dilated loops of bowel scattered throughout the abdomen with associated internal  air-fluid levels, consistent with ongoing/persistent obstructive process, likely similar relative to recent CT from 04/08/2017. 2. No active cardiopulmonary disease. 3. Aortic atherosclerosis. Electronically Signed   By: Jeannine Boga M.D.   On: 04/14/2017 23:19   ASSESSMENT AND PLAN:  Kimberly [name] is a 71 y.o. female who presents to the ED from home with a chief complaint of abdominal pain and vomiting. Patient is scheduled for colonoscopy in the morning by Dr. Allen Norris. She had an obstructive process seen on CT scan recently and scheduled for colonoscopy to evaluate for mass. Patient started her bowel prep approximately 5 PM and has had severe generalized abdominal pain with nausea/vomiting 2.  1. SBO due to colon mass, transverse noted on CT abdomen  -unable to tolerated colon prep. -Surgical consultation appreciated.  -GI input noted -npo -IVF  2. HL  3. H/o breast cancer  4. DVT prophylaxis Lovenonx  Case discussed with Care Management/Social Worker. Management plans discussed with the patient, family and they are in agreement.  CODE STATUS: full  DVT Prophylaxis: lovenox  TOTAL TIME TAKING CARE OF THIS PATIENT: 30 minutes.  >50% time spent on counselling and coordination of care  POSSIBLE D/C IN 1-2 DAYS, DEPENDING ON CLINICAL CONDITION.  Note: This dictation was prepared with Dragon dictation along with smaller phrase technology. Any transcriptional errors that result from this process are unintentional.  Kay Shippy M.D on 04/15/2017 at 6:40 PM  Between 7am to 6pm - Pager - [phone]  After 6pm go to [email or web address] - password [email or web address] [facility]  Office  [phone]  CC: Primary care physician; Birdie Sons, MD

## 2017-04-16 ENCOUNTER — Encounter: Payer: Self-pay | Admitting: General Surgery

## 2017-04-16 DIAGNOSIS — M1710 Unilateral primary osteoarthritis, unspecified knee: Secondary | ICD-10-CM | POA: Diagnosis present

## 2017-04-16 DIAGNOSIS — I2510 Atherosclerotic heart disease of native coronary artery without angina pectoris: Secondary | ICD-10-CM | POA: Diagnosis present

## 2017-04-16 DIAGNOSIS — Z888 Allergy status to other drugs, medicaments and biological substances status: Secondary | ICD-10-CM | POA: Diagnosis not present

## 2017-04-16 DIAGNOSIS — Z6824 Body mass index (BMI) 24.0-24.9, adult: Secondary | ICD-10-CM | POA: Diagnosis not present

## 2017-04-16 DIAGNOSIS — Z791 Long term (current) use of non-steroidal anti-inflammatories (NSAID): Secondary | ICD-10-CM | POA: Diagnosis not present

## 2017-04-16 DIAGNOSIS — G8929 Other chronic pain: Secondary | ICD-10-CM | POA: Diagnosis present

## 2017-04-16 DIAGNOSIS — K567 Ileus, unspecified: Secondary | ICD-10-CM | POA: Diagnosis not present

## 2017-04-16 DIAGNOSIS — K56609 Unspecified intestinal obstruction, unspecified as to partial versus complete obstruction: Secondary | ICD-10-CM

## 2017-04-16 DIAGNOSIS — Z923 Personal history of irradiation: Secondary | ICD-10-CM | POA: Diagnosis not present

## 2017-04-16 DIAGNOSIS — F17210 Nicotine dependence, cigarettes, uncomplicated: Secondary | ICD-10-CM | POA: Diagnosis present

## 2017-04-16 DIAGNOSIS — Z86010 Personal history of colonic polyps: Secondary | ICD-10-CM | POA: Diagnosis not present

## 2017-04-16 DIAGNOSIS — G629 Polyneuropathy, unspecified: Secondary | ICD-10-CM | POA: Diagnosis present

## 2017-04-16 DIAGNOSIS — E785 Hyperlipidemia, unspecified: Secondary | ICD-10-CM | POA: Diagnosis present

## 2017-04-16 DIAGNOSIS — R1084 Generalized abdominal pain: Secondary | ICD-10-CM | POA: Diagnosis present

## 2017-04-16 DIAGNOSIS — R0902 Hypoxemia: Secondary | ICD-10-CM | POA: Diagnosis present

## 2017-04-16 DIAGNOSIS — C184 Malignant neoplasm of transverse colon: Secondary | ICD-10-CM | POA: Diagnosis present

## 2017-04-16 DIAGNOSIS — Z23 Encounter for immunization: Secondary | ICD-10-CM | POA: Diagnosis present

## 2017-04-16 DIAGNOSIS — Z853 Personal history of malignant neoplasm of breast: Secondary | ICD-10-CM | POA: Diagnosis not present

## 2017-04-16 DIAGNOSIS — M810 Age-related osteoporosis without current pathological fracture: Secondary | ICD-10-CM | POA: Diagnosis present

## 2017-04-16 DIAGNOSIS — E663 Overweight: Secondary | ICD-10-CM | POA: Diagnosis present

## 2017-04-16 DIAGNOSIS — Z7951 Long term (current) use of inhaled steroids: Secondary | ICD-10-CM | POA: Diagnosis not present

## 2017-04-16 DIAGNOSIS — M5136 Other intervertebral disc degeneration, lumbar region: Secondary | ICD-10-CM | POA: Diagnosis present

## 2017-04-16 HISTORY — DX: Unspecified intestinal obstruction, unspecified as to partial versus complete obstruction: K56.609

## 2017-04-16 LAB — BASIC METABOLIC PANEL
ANION GAP: 5 (ref 5–15)
BUN: 11 mg/dL (ref 6–20)
CHLORIDE: 110 mmol/L (ref 101–111)
CO2: 23 mmol/L (ref 22–32)
Calcium: 7.3 mg/dL — ABNORMAL LOW (ref 8.9–10.3)
Creatinine, Ser: 0.53 mg/dL (ref 0.44–1.00)
GFR calc Af Amer: >60 mL/min (ref >60)
GFR calc non Af Amer: >60 mL/min (ref >60)
GLUCOSE: 94 mg/dL (ref 65–99)
POTASSIUM: 3.5 mmol/L (ref 3.5–5.1)
Sodium: 138 mmol/L (ref 135–145)

## 2017-04-16 LAB — HEMOGLOBIN A1C
Hgb A1c MFr Bld: 5.6 % (ref 4.8–5.6)
Mean Plasma Glucose: 114 mg/dL

## 2017-04-16 LAB — CBC
HEMATOCRIT: 38 % (ref 35.0–47.0)
HEMOGLOBIN: 12.9 g/dL (ref 12.0–16.0)
MCH: 29.5 pg (ref 26.0–34.0)
MCHC: 33.8 g/dL (ref 32.0–36.0)
MCV: 87.1 fL (ref 80.0–100.0)
PLATELETS: 382 10*3/uL (ref 150–440)
RBC: 4.37 MIL/uL (ref 3.80–5.20)
RDW: 19.1 % — ABNORMAL HIGH (ref 11.5–14.5)
WBC: 23 10*3/uL — AB (ref 3.6–11.0)

## 2017-04-16 MED ORDER — PNEUMOCOCCAL VAC POLYVALENT 25 MCG/0.5ML IJ INJ
0.5000 mL | INJECTION | INTRAMUSCULAR | Status: AC
  Administered 2017-04-17: 0.5 mL via INTRAMUSCULAR
  Filled 2017-04-16: qty 0.5

## 2017-04-16 MED ORDER — ENOXAPARIN SODIUM 40 MG/0.4ML ~~LOC~~ SOLN
40.0000 mg | SUBCUTANEOUS | Status: DC
  Administered 2017-04-16 – 2017-04-18 (×3): 40 mg via SUBCUTANEOUS
  Filled 2017-04-16 (×3): qty 0.4

## 2017-04-16 NOTE — Consult Note (Signed)
[facility] Nurse ostomy consult note Stoma type/location: Transverse end colostomy and MF.   Stomal assessment/size:  RLQ Colostomy oval shaped, edematous, 1 1/2" dark and producing liquid brown stool LLQ Mucus fistula, pink and patent Peristomal assessment: intact  Midline abdominal incision with honeycomb dressing in place.  Treatment options for stomal/peristomal skin: barrier ring to ostomy  Output soft liquid stool from colostomy Ostomy pouching: 2pc. 2 3/4" pouch with barrier ring   Education provided: Pouch change performed with daughter at bedside.  Discussed emptying when 1/3 full.  Function of mucus fistula and that can likely be managed with a dry dressing daily. Discussed function of barrier ring and that dark stomal tissue with likely slough off and that stoma is functioning.  Enrolled patient in [place] program:No [place] team will follow.  Domenic Moras RN BSN [facility] Pager [phone]

## 2017-04-16 NOTE — Progress Notes (Signed)
1 Day Post-Op   Subjective:  Patient reports doing okay this morning. She states she is hungry. Having abdominal pain that is currently controlled with the PCA.  Vital signs in last 24 hours: Temp:  [97.5 F (36.4 C)-98.3 F (36.8 C)] 98.1 F (36.7 C) (07/18 0528) Pulse Rate:  [62-88] 80 (07/18 0528) Resp:  [11-28] 14 (07/18 0730) BP: (124-163)/(67-96) 124/76 (07/18 0528) SpO2:  [83 %-98 %] 92 % (07/18 0730) Weight:  [67.1 kg (148 lb)] 67.1 kg (148 lb) (07/18 0500) Last BM Date: 04/14/17  Intake/Output from previous day: 07/17 0701 - 07/18 0700 In: 4208.8 [P.O.:30; I.V.:4178.8] Out: 2110 [Urine:1195; Emesis/NG output:505; Stool:360; Blood:50]  GI: Abdomen soft, appropriately tender to palpation at the midline. Midline dressing in place with some serosanguineous drainage to the honeycomb. Bilateral ostomy appliances with viable stomas bilaterally.  Lab Results:  CBC  Recent Labs  04/14/17 2212 04/16/17 0348  WBC 16.2* 23.0*  HGB 16.5* 12.9  HCT 49.0* 38.0  PLT 569* 382   CMP     Component Value Date/Time   NA 138 04/16/2017 0348   NA 141 01/02/2017 1410   NA 136 01/27/2012 0637   K 3.5 04/16/2017 0348   K 4.3 01/27/2012 0637   CL 110 04/16/2017 0348   CL 102 01/27/2012 0637   CO2 23 04/16/2017 0348   CO2 25 01/27/2012 0637   GLUCOSE 94 04/16/2017 0348   GLUCOSE 119 (H) 01/27/2012 0637   BUN 11 04/16/2017 0348   BUN 14 01/02/2017 1410   BUN 15 01/27/2012 0637   CREATININE 0.53 04/16/2017 0348   CREATININE 0.67 01/27/2012 0637   CALCIUM 7.3 (L) 04/16/2017 0348   CALCIUM 7.7 (L) 01/27/2012 0637   PROT 7.6 04/14/2017 2212   PROT 7.0 01/02/2017 1410   PROT 7.6 01/26/2012 0021   ALBUMIN 3.4 (L) 04/14/2017 2212   ALBUMIN 3.9 01/02/2017 1410   ALBUMIN 3.5 01/26/2012 0021   AST 22 04/14/2017 2212   AST 17 01/26/2012 0021   ALT 18 04/14/2017 2212   ALT 19 01/26/2012 0021   ALKPHOS 134 (H) 04/14/2017 2212   ALKPHOS 87 01/26/2012 0021   BILITOT 0.8 04/14/2017  2212   BILITOT <0.2 01/02/2017 1410   BILITOT 0.2 01/26/2012 0021   GFRNONAA >60 04/16/2017 0348   GFRNONAA >60 01/27/2012 0637   GFRAA >60 04/16/2017 0348   GFRAA >60 01/27/2012 0637   PT/INR No results for input(s): LABPROT, INR in the last 72 hours.  Studies/Results: Dg Abdomen Acute W/chest  Result Date: 04/14/2017 CLINICAL DATA:  Initial evaluation for acute abdominal pain. EXAM: DG ABDOMEN ACUTE W/ 1V CHEST COMPARISON:  Prior CT from 04/08/2017. FINDINGS: Cardiac and mediastinal silhouettes are within normal limits. Aortic atherosclerosis. Lungs are mildly hypoinflated. Chronic coarsening of the interstitial markings. No focal infiltrates. No pulmonary edema or pleural effusion. No pneumothorax. Multiple prominent dilated gas-filled loops of bowel seen scattered throughout the abdomen. Air-fluid levels seen on upright projection. Paucity of gas overlies the distal bowel. Few decompressed contrast filled loops of bowel are overlie the pelvis. Findings concerning for persistent obstructive process, likely similar to previous CT. No free air. No soft tissue mass or abnormal calcification. No acute osseus abnormality. Degenerative changes noted within the spine. Fixation hardware noted at the left hip. IMPRESSION: 1. Multiple dilated loops of bowel scattered throughout the abdomen with associated internal air-fluid levels, consistent with ongoing/persistent obstructive process, likely similar relative to recent CT from 04/08/2017. 2. No active cardiopulmonary disease. 3. Aortic atherosclerosis. Electronically  Signed   By: Jeannine Boga M.D.   On: 04/14/2017 23:19    Assessment/Plan: 71 year old female one day status post transverse colectomy with end colostomy and mucous fistula formation. Discussed with patient the role for today's to be up out of bed. We will trend her NG tube output and hopefully remove her NG tube later today. When she is ambulating we will then be able to remove her  Foley catheter. Encourage incentive spirometer usage. Continue IV antibiotics for now due to elevated white blood cell count and likely spillage of enteric contents during her surgery. Patient voiced understanding.   Clayburn Pert, MD [facility]  Day [facility] [phone] Night ASCOM [phone]  04/16/2017

## 2017-04-16 NOTE — Progress Notes (Addendum)
Verbal order from on-call surgeon to discontinue NG tube and foley at this time. Pt due to void by 2130 04/15/2017.   Kimberly [name] [employer]

## 2017-04-16 NOTE — Progress Notes (Signed)
[facility] at [place] NAME: Kimberly [name]    MR#:  [id number]  DATE OF BIRTH:  May 10, 1946  SUBJECTIVE:   Patient is postop day 1. She already ambulated using a walker. Doing well.  PCA pump -morphine  REVIEW OF SYSTEMS:   Review of Systems  Constitutional: Positive for weight loss. Negative for chills and fever.  HENT: Negative for ear discharge, ear pain and nosebleeds.   Eyes: Negative for blurred vision, pain and discharge.  Respiratory: Negative for sputum production, shortness of breath, wheezing and stridor.   Cardiovascular: Negative for chest pain, palpitations, orthopnea and PND.  Gastrointestinal: Positive for abdominal pain and vomiting. Negative for diarrhea and nausea.  Genitourinary: Negative for frequency and urgency.  Musculoskeletal: Negative for back pain and joint pain.  Neurological: Positive for weakness. Negative for sensory change, speech change and focal weakness.  Psychiatric/Behavioral: Negative for depression and hallucinations. The patient is not nervous/anxious.    Tolerating Diet:Nothing by mouth  Tolerating PT: Ambulatory  DRUG ALLERGIES:   Allergies  Allergen Reactions  . Sinus Formula [Cholestatin] Nausea Only  . Iodine Other (See Comments)    Topical iodine she states "if you put it in an open sore it will cause a eating ulcer."    VITALS:  Blood pressure (!) 105/57, pulse 81, temperature (!) 97.4 F (36.3 C), temperature source Oral, resp. rate (!) 8, height 5\' 4"  (1.626 m), weight 67.1 kg (148 lb), SpO2 97 %.  PHYSICAL EXAMINATION:   Physical Exam  GENERAL:  71 y.o.-year-old patient lying in the bed with no acute distress.  EYES: Pupils equal, round, reactive to light and accommodation. No scleral icterus. Extraocular muscles intact.  HEENT: Head atraumatic, normocephalic. Oropharynx and nasopharynx clear. NG tube  NECK:  Supple, no jugular venous distention. No thyroid enlargement, no  tenderness.  LUNGS: Normal breath sounds bilaterally, no wheezing, rales, rhonchi. No use of accessory muscles of respiration.  CARDIOVASCULAR: S1, S2 normal. No murmurs, rubs, or gallops.  ABDOMEN: Soft, diffusely tender, distended. Bowel sounds present. No organomegaly or mass. Foley catheter, surgical dressing present.  EXTREMITIES: No cyanosis, clubbing or edema b/l.    NEUROLOGIC: Cranial nerves II through XII are intact. No focal Motor or sensory deficits b/l.   PSYCHIATRIC:  patient is alert and oriented x 3.  SKIN: No obvious rash, lesion, or ulcer.   LABORATORY PANEL:  CBC  Recent Labs Lab 04/16/17 0348  WBC 23.0*  HGB 12.9  HCT 38.0  PLT 382    Chemistries   Recent Labs Lab 04/14/17 2212 04/16/17 0348  NA 136 138  K 4.2 3.5  CL 97* 110  CO2 27 23  GLUCOSE 145* 94  BUN 17 11  CREATININE 0.90 0.53  CALCIUM 8.9 7.3*  AST 22  --   ALT 18  --   ALKPHOS 134*  --   BILITOT 0.8  --    Cardiac Enzymes No results for input(s): TROPONINI in the last 168 hours. RADIOLOGY:  Dg Abdomen Acute W/chest  Result Date: 04/14/2017 CLINICAL DATA:  Initial evaluation for acute abdominal pain. EXAM: DG ABDOMEN ACUTE W/ 1V CHEST COMPARISON:  Prior CT from 04/08/2017. FINDINGS: Cardiac and mediastinal silhouettes are within normal limits. Aortic atherosclerosis. Lungs are mildly hypoinflated. Chronic coarsening of the interstitial markings. No focal infiltrates. No pulmonary edema or pleural effusion. No pneumothorax. Multiple prominent dilated gas-filled loops of bowel seen scattered throughout the abdomen. Air-fluid levels seen on upright projection. Paucity of  gas overlies the distal bowel. Few decompressed contrast filled loops of bowel are overlie the pelvis. Findings concerning for persistent obstructive process, likely similar to previous CT. No free air. No soft tissue mass or abnormal calcification. No acute osseus abnormality. Degenerative changes noted within the spine.  Fixation hardware noted at the left hip. IMPRESSION: 1. Multiple dilated loops of bowel scattered throughout the abdomen with associated internal air-fluid levels, consistent with ongoing/persistent obstructive process, likely similar relative to recent CT from 04/08/2017. 2. No active cardiopulmonary disease. 3. Aortic atherosclerosis. Electronically Signed   By: Jeannine Boga M.D.   On: 04/14/2017 23:19   ASSESSMENT AND PLAN:  Kimberly [name] is a 71 y.o. female who presents to the ED from home with a chief complaint of abdominal pain and vomiting. Patient is scheduled for colonoscopy in the morning by Dr. Allen Norris. She had an obstructive process seen on CT scan recently and scheduled for colonoscopy to evaluate for mass. Patient started her bowel prep approximately 5 PM and has had severe generalized abdominal pain with nausea/vomiting 2.  1. SBO due to colon mass, transverse noted on CT abdomen  -unable to tolerated colon prep. -Surgical consultation appreciated. Pt is  S/p Exploratory laparotomy, transverse colectomy, mobilization of hepatic flexure, end colostomy and mucous fistula creation POD #1 -npo -IVF -Morphine PCA pump per surgery recommendation  2. HL  3. H/o breast cancer  4. DVT prophylaxis Lovenonx  Case discussed with Care Management/Social Worker. Management plans discussed with the patient, family and they are in agreement.  CODE STATUS: full  DVT Prophylaxis: lovenox  TOTAL TIME TAKING CARE OF THIS PATIENT: 30 minutes.  >50% time spent on counselling and coordination of care  POSSIBLE D/C IN 1-2 DAYS, DEPENDING ON CLINICAL CONDITION.  Note: This dictation was prepared with Dragon dictation along with smaller phrase technology. Any transcriptional errors that result from this process are unintentional.  Kida Digiulio M.D on 04/16/2017 at 3:36 PM  Between 7am to 6pm - Pager - [phone]  After 6pm go to [email or web address] - password [email or web address]  [facility]  Office  [phone]  CC: Primary care physician; Birdie Sons, MD

## 2017-04-16 NOTE — Progress Notes (Signed)
RN helped pt with ambulation around the nursing station. Pt ambulated with a walker, with a total of 18ft. Pt tolerated ambulation well. Pt is currently sitting in chair. Will continue to encourage ambulation.   Kimberly [name] [employer]

## 2017-04-17 LAB — CBC
HEMATOCRIT: 38 % (ref 35.0–47.0)
HEMOGLOBIN: 12.4 g/dL (ref 12.0–16.0)
MCH: 28.8 pg (ref 26.0–34.0)
MCHC: 32.6 g/dL (ref 32.0–36.0)
MCV: 88.5 fL (ref 80.0–100.0)
Platelets: 326 10*3/uL (ref 150–440)
RBC: 4.3 MIL/uL (ref 3.80–5.20)
RDW: 19.8 % — ABNORMAL HIGH (ref 11.5–14.5)
WBC: 18.5 10*3/uL — AB (ref 3.6–11.0)

## 2017-04-17 LAB — BASIC METABOLIC PANEL
ANION GAP: 7 (ref 5–15)
BUN: 12 mg/dL (ref 6–20)
CHLORIDE: 107 mmol/L (ref 101–111)
CO2: 23 mmol/L (ref 22–32)
CREATININE: 0.47 mg/dL (ref 0.44–1.00)
Calcium: 7.7 mg/dL — ABNORMAL LOW (ref 8.9–10.3)
GFR calc non Af Amer: >60 mL/min (ref >60)
Glucose, Bld: 78 mg/dL (ref 65–99)
POTASSIUM: 3.3 mmol/L — AB (ref 3.5–5.1)
SODIUM: 137 mmol/L (ref 135–145)

## 2017-04-17 MED ORDER — POTASSIUM CHLORIDE CRYS ER 20 MEQ PO TBCR
40.0000 meq | EXTENDED_RELEASE_TABLET | Freq: Once | ORAL | Status: AC
  Administered 2017-04-17: 40 meq via ORAL
  Filled 2017-04-17: qty 2

## 2017-04-17 NOTE — Progress Notes (Signed)
2 Days Post-Op   Subjective:  Patient reports she continues to feel better. She is hungry and denies any nausea after removal her NG tube last night. She has been ambulating and having output from her ostomy.  Vital signs in last 24 hours: Temp:  [97.4 F (36.3 C)-98.4 F (36.9 C)] 98.4 F (36.9 C) (07/19 0822) Pulse Rate:  [81-96] 83 (07/19 0822) Resp:  [8-20] 16 (07/19 0822) BP: (105-128)/(52-61) 128/52 (07/19 0822) SpO2:  [93 %-97 %] 95 % (07/19 0822) Weight:  [67.1 kg (148 lb)] 67.1 kg (148 lb) (07/19 0500) Last BM Date: 04/16/17 (colostomy)  Intake/Output from previous day: 07/18 0701 - 07/19 0700 In: 2855 [I.V.:2855] Out: 2125 [Urine:1375; Emesis/NG output:450; Stool:300]  GI: Abdomen is soft, appropriately tender to midline, and nondistended. Mucous fistula left upper quadrant with minimal output as expected. In the colostomy in the right upper quadrant with liquid stool output. The superficial portion of the ostomy is dusky but the deeper tissues appear viable.  Lab Results:  CBC  Recent Labs  04/16/17 0348 04/17/17 0725  WBC 23.0* 18.5*  HGB 12.9 12.4  HCT 38.0 38.0  PLT 382 326   CMP     Component Value Date/Time   NA 137 04/17/2017 0725   NA 141 01/02/2017 1410   NA 136 01/27/2012 0637   K 3.3 (L) 04/17/2017 0725   K 4.3 01/27/2012 0637   CL 107 04/17/2017 0725   CL 102 01/27/2012 0637   CO2 23 04/17/2017 0725   CO2 25 01/27/2012 0637   GLUCOSE 78 04/17/2017 0725   GLUCOSE 119 (H) 01/27/2012 0637   BUN 12 04/17/2017 0725   BUN 14 01/02/2017 1410   BUN 15 01/27/2012 0637   CREATININE 0.47 04/17/2017 0725   CREATININE 0.67 01/27/2012 0637   CALCIUM 7.7 (L) 04/17/2017 0725   CALCIUM 7.7 (L) 01/27/2012 0637   PROT 7.6 04/14/2017 2212   PROT 7.0 01/02/2017 1410   PROT 7.6 01/26/2012 0021   ALBUMIN 3.4 (L) 04/14/2017 2212   ALBUMIN 3.9 01/02/2017 1410   ALBUMIN 3.5 01/26/2012 0021   AST 22 04/14/2017 2212   AST 17 01/26/2012 0021   ALT 18  04/14/2017 2212   ALT 19 01/26/2012 0021   ALKPHOS 134 (H) 04/14/2017 2212   ALKPHOS 87 01/26/2012 0021   BILITOT 0.8 04/14/2017 2212   BILITOT <0.2 01/02/2017 1410   BILITOT 0.2 01/26/2012 0021   GFRNONAA >60 04/17/2017 0725   GFRNONAA >60 01/27/2012 0637   GFRAA >60 04/17/2017 0725   GFRAA >60 01/27/2012 0637   PT/INR No results for input(s): LABPROT, INR in the last 72 hours.  Studies/Results: No results found.  Assessment/Plan: 71 year old female postop day #2 from a transverse colectomy with colostomy creation. Doing well. Plan to start a clear liquid diet today and discontinue her PCA. Encourage ambulation and incentive spirometer usage. Await pathology results prior to oncology referral.   Clayburn Pert, MD [facility] General Surgeon [facility]  Day ASCOM [phone] Night ASCOM [phone]  04/17/2017

## 2017-04-17 NOTE — Progress Notes (Signed)
Notified dr.pabon about pt stoma looking very dark in color while changing colostomy bag r/t leak. Acknowledged, no new orders.

## 2017-04-17 NOTE — Consult Note (Signed)
[facility] Nurse ostomy follow up  Stoma type/location: Transverse end colostomy and MF.   Stomal assessment/size:  RLQ Colostomy oval shaped, edematous, 1 1/2" dark and producing liquid brown stool LLQ Mucus fistula, pink and patent Peristomal assessment: intact  Midline abdominal incision with dry dressing in place Treatment options for stomal/peristomal skin: barrier ring to ostomy  Output soft liquid stool from colostomy Ostomy pouching: 2pc. 2 3/4" pouch with barrier ring   Education provided: Pouch change performed with daughter at bedside.  Discussed emptying when 1/3 full.  Changed MF pouch today.  Patient and daughter participated.  Both can demonstrate emptying pouch. . Discussed function of barrier ring and that dark stomal tissue with likely slough off and that stoma is functioning.  Enrolled patient in [place] program:No [place] team will follow.  Domenic Moras RN BSN [facility]

## 2017-04-17 NOTE — Progress Notes (Signed)
[place] at [place] NAME: Kimberly [name]    MR#:  [id number]  DATE OF BIRTH:  1946-07-08  SUBJECTIVE:   Patient is postop day 2. She already ambulated . Doing well.  PCA pump -morphine  Discontinued. Removed NG REVIEW OF SYSTEMS:   Review of Systems  Constitutional: Positive for weight loss. Negative for chills and fever.  HENT: Negative for ear discharge, ear pain and nosebleeds.   Eyes: Negative for blurred vision, pain and discharge.  Respiratory: Negative for sputum production, shortness of breath, wheezing and stridor.   Cardiovascular: Negative for chest pain, palpitations, orthopnea and PND.  Gastrointestinal: Positive for abdominal pain and vomiting. Negative for diarrhea and nausea.  Genitourinary: Negative for frequency and urgency.  Musculoskeletal: Negative for back pain and joint pain.  Neurological: Positive for weakness. Negative for sensory change, speech change and focal weakness.  Psychiatric/Behavioral: Negative for depression and hallucinations. The patient is not nervous/anxious.    Tolerating Diet:CLD Tolerating PT: Ambulatory  DRUG ALLERGIES:   Allergies  Allergen Reactions  . Sinus Formula [Cholestatin] Nausea Only  . Iodine Other (See Comments)    Topical iodine she states "if you put it in an open sore it will cause a eating ulcer."    VITALS:  Blood pressure 107/83, pulse 86, temperature 98.5 F (36.9 C), temperature source Oral, resp. rate 16, height 5\' 4"  (1.626 m), weight 67.1 kg (148 lb), SpO2 (!) 89 %.  PHYSICAL EXAMINATION:   Physical Exam  GENERAL:  71 y.o.-year-old patient lying in the bed with no acute distress.  EYES: Pupils equal, round, reactive to light and accommodation. No scleral icterus. Extraocular muscles intact.  HEENT: Head atraumatic, normocephalic. Oropharynx and nasopharynx clear. NG tube  NECK:  Supple, no jugular venous distention. No thyroid enlargement, no tenderness.   LUNGS: Normal breath sounds bilaterally, no wheezing, rales, rhonchi. No use of accessory muscles of respiration.  CARDIOVASCULAR: S1, S2 normal. No murmurs, rubs, or gallops.  ABDOMEN: Soft, diffusely tender, distended. Bowel sounds present. No organomegaly or mass. Foley catheter, surgical dressing present. Colostomy+ EXTREMITIES: No cyanosis, clubbing or edema b/l.    NEUROLOGIC: Cranial nerves II through XII are intact. No focal Motor or sensory deficits b/l.   PSYCHIATRIC:  patient is alert and oriented x 3.  SKIN: No obvious rash, lesion, or ulcer.   LABORATORY PANEL:  CBC  Recent Labs Lab 04/17/17 0725  WBC 18.5*  HGB 12.4  HCT 38.0  PLT 326    Chemistries   Recent Labs Lab 04/14/17 2212  04/17/17 0725  NA 136  < > 137  K 4.2  < > 3.3*  CL 97*  < > 107  CO2 27  < > 23  GLUCOSE 145*  < > 78  BUN 17  < > 12  CREATININE 0.90  < > 0.47  CALCIUM 8.9  < > 7.7*  AST 22  --   --   ALT 18  --   --   ALKPHOS 134*  --   --   BILITOT 0.8  --   --   < > = values in this interval not displayed. Cardiac Enzymes No results for input(s): TROPONINI in the last 168 hours. RADIOLOGY:  No results found. ASSESSMENT AND PLAN:  Kimberly [name] is a 71 y.o. female who presents to the ED from home with a chief complaint of abdominal pain and vomiting. Patient is scheduled for colonoscopy in the morning by Dr.  Wohl. She had an obstructive process seen on CT scan recently and scheduled for colonoscopy to evaluate for mass. Patient started her bowel prep approximately 5 PM and has had severe generalized abdominal pain with nausea/vomiting 2.  1. SBO due to colon mass, transverse noted on CT abdomen  -unable to tolerated colon prep. -Surgical consultation appreciated. Pt is  S/p Exploratory laparotomy, transverse colectomy, mobilization of hepatic flexure, end colostomy and mucous fistula creation POD # 2 -npo--now on CLD -IVF -Morphine PCA pump per surgery recommendation--d/ced  2.  HL  3. H/o breast cancer  4. DVT prophylaxis Lovenonx  Case discussed with Care Management/Social Worker. Management plans discussed with the patient, family and they are in agreement.  CODE STATUS: full  DVT Prophylaxis: lovenox  TOTAL TIME TAKING CARE OF THIS PATIENT: 30 minutes.  >50% time spent on counselling and coordination of care  POSSIBLE D/C IN 1-2 DAYS, DEPENDING ON CLINICAL CONDITION.  Note: This dictation was prepared with Dragon dictation along with smaller phrase technology. Any transcriptional errors that result from this process are unintentional.  Kimberly [name] M.D on 04/17/2017 at 2:20 PM  Between 7am to 6pm - Pager - [phone]  After 6pm go to [email or web address] - password [email or web address] [place] [facility]  Office  [phone]  CC: Primary care physician; Birdie Sons, MD

## 2017-04-18 ENCOUNTER — Encounter: Admission: RE | Payer: Self-pay | Source: Ambulatory Visit

## 2017-04-18 ENCOUNTER — Ambulatory Visit: Admission: RE | Admit: 2017-04-18 | Payer: Medicare HMO | Source: Ambulatory Visit | Admitting: Gastroenterology

## 2017-04-18 ENCOUNTER — Other Ambulatory Visit: Payer: Self-pay | Admitting: Pathology

## 2017-04-18 LAB — CBC
HEMATOCRIT: 33.1 % — AB (ref 35.0–47.0)
HEMOGLOBIN: 11 g/dL — AB (ref 12.0–16.0)
MCH: 29.4 pg (ref 26.0–34.0)
MCHC: 33.1 g/dL (ref 32.0–36.0)
MCV: 89 fL (ref 80.0–100.0)
Platelets: 292 10*3/uL (ref 150–440)
RBC: 3.72 MIL/uL — ABNORMAL LOW (ref 3.80–5.20)
RDW: 19.5 % — ABNORMAL HIGH (ref 11.5–14.5)
WBC: 11.6 10*3/uL — AB (ref 3.6–11.0)

## 2017-04-18 LAB — BASIC METABOLIC PANEL
ANION GAP: 4 — AB (ref 5–15)
BUN: 8 mg/dL (ref 6–20)
CHLORIDE: 107 mmol/L (ref 101–111)
CO2: 25 mmol/L (ref 22–32)
Calcium: 7.4 mg/dL — ABNORMAL LOW (ref 8.9–10.3)
Creatinine, Ser: 0.51 mg/dL (ref 0.44–1.00)
GFR calc Af Amer: >60 mL/min (ref >60)
GFR calc non Af Amer: >60 mL/min (ref >60)
GLUCOSE: 75 mg/dL (ref 65–99)
POTASSIUM: 3.4 mmol/L — AB (ref 3.5–5.1)
Sodium: 136 mmol/L (ref 135–145)

## 2017-04-18 SURGERY — COLONOSCOPY WITH PROPOFOL
Anesthesia: Choice

## 2017-04-18 MED ORDER — BOOST / RESOURCE BREEZE PO LIQD
1.0000 | Freq: Three times a day (TID) | ORAL | Status: DC
  Administered 2017-04-18 – 2017-04-19 (×3): 1 via ORAL

## 2017-04-18 MED ORDER — HYDROCODONE-ACETAMINOPHEN 5-325 MG PO TABS
1.0000 | ORAL_TABLET | Freq: Four times a day (QID) | ORAL | Status: DC | PRN
  Administered 2017-04-18 – 2017-04-19 (×3): 2 via ORAL
  Filled 2017-04-18 (×3): qty 2

## 2017-04-18 MED ORDER — IBUPROFEN 400 MG PO TABS
600.0000 mg | ORAL_TABLET | Freq: Four times a day (QID) | ORAL | Status: DC | PRN
  Administered 2017-04-18: 600 mg via ORAL
  Filled 2017-04-18: qty 2

## 2017-04-18 NOTE — Care Management (Signed)
Patient Days Post-Op s/p partial transverse colectomy with creation of end-colostomy and mucus fistula.  Patient lives at home alone.  Daughter lives locally for support. PCP [facility].  Pharmacy [facility].  Independent at baseline.  No medical equipment.  Anticipate needs for home health nursing at discharge for ostomy care.  Patient provided with home health agency preference.  No agency preference.  Message left for Manuela Schwartz with [facility] for heads up referral.  RNCM following for discharge planning needs.

## 2017-04-18 NOTE — Progress Notes (Signed)
[place] Hospital Day(s): 2.   Post op day(s): 3 Days Post-Op.   Interval History: Patient seen and examined, no acute events or new complaints overnight. Patient reports only minimal peri-incisional pain with +gas and +stool into ostomy bag, denies any N/V, fever/chills, CP, or SOB, though she and her daughter do identify and ask about some Right flank pain and redness they say she's had since her surgery.  Review of Systems:  Constitutional: denies fever, chills  HEENT: denies cough or congestion  Respiratory: denies any shortness of breath  Cardiovascular: denies chest pain or palpitations  Gastrointestinal: abdominal pain, N/V, and bowel function as per interval history Genitourinary: denies burning with urination or urinary frequency Musculoskeletal: denies pain, decreased motor or sensation Integumentary: denies any other rashes or skin discolorations except post-surgical abdominal wounds Neurological: denies HA or vision/hearing changes   Vital signs in last 24 hours: [min-max] current  Temp:  [97.8 F (36.6 C)-98.7 F (37.1 C)] 97.8 F (36.6 C) (07/20 0505) Pulse Rate:  [78-86] 78 (07/20 0505) Resp:  [14-20] 20 (07/20 0505) BP: (107-129)/(52-83) 129/72 (07/20 0505) SpO2:  [89 %-96 %] 94 % (07/20 0505)     Height: 5\' 4"  (162.6 cm) Weight: 148 lb (67.1 kg) BMI (Calculated): 24.9   Intake/Output this shift:  Total I/O In: 302 [I.V.:302] Out: 150 [Stool:150]   Intake/Output last 2 shifts:  @IOLAST2SHIFTS @   Physical Exam:  Constitutional: alert, cooperative and no distress  HENT: normocephalic without obvious abnormality  Eyes: PERRL, EOM's grossly intact and symmetric  Neuro: CN II - XII grossly intact and symmetric without deficit  Respiratory: breathing non-labored at rest  Cardiovascular: regular rate and sinus rhythm  Gastrointestinal: soft, overweight, and non-distended with functional ostomy and mucus fistula, incision well-approximated  without erythema or drainage, midline dressing c/d/i (left on to protect from adjacent colostomy), moderate Right flank erythema and TTP  Musculoskeletal: UE and LE FROM, no edema or wounds, motor and sensation grossly intact, NT   Labs:  CBC Latest Ref Rng & Units 04/18/2017 04/17/2017 04/16/2017  WBC 3.6 - 11.0 K/uL 11.6(H) 18.5(H) 23.0(H)  Hemoglobin 12.0 - 16.0 g/dL 11.0(L) 12.4 12.9  Hematocrit 35.0 - 47.0 % 33.1(L) 38.0 38.0  Platelets 150 - 440 K/uL 292 326 382   CMP Latest Ref Rng & Units 04/18/2017 04/17/2017 04/16/2017  Glucose 65 - 99 mg/dL 75 78 94  BUN 6 - 20 mg/dL 8 12 11   Creatinine 0.44 - 1.00 mg/dL 0.51 0.47 0.53  Sodium 135 - 145 mmol/L 136 137 138  Potassium 3.5 - 5.1 mmol/L 3.4(L) 3.3(L) 3.5  Chloride 101 - 111 mmol/L 107 107 110  CO2 22 - 32 mmol/L 25 23 23   Calcium 8.9 - 10.3 mg/dL 7.4(L) 7.7(L) 7.3(L)  Total Protein 6.5 - 8.1 g/dL - - -  Total Bilirubin 0.3 - 1.2 mg/dL - - -  Alkaline Phos 38 - 126 U/L - - -  AST 15 - 41 U/L - - -  ALT 14 - 54 U/L - - -   Imaging studies: No new pertinent imaging studies   Assessment/Plan: 71 y.o. female with moderate Right flank erythema and pain in the context of Right-side colostomy, normalized WBC while afebrile and resolving post-operative ileus, otherwise doing well 3 Days Post-Op s/p partial transverse colectomy with creation of end-colostomy and mucus fistula for obstructing transverse colonic mass with palpable mesenteric masses 2 years s/p colonoscopy, complicated by pertinent comorbidities including HLD, remote history of Left breast cancer s/p lumpectomy  with radiation therapy, osteoarthritis with chronic lumbar back and knee pain, peripheral neuropathy, and chronic tobacco abuse/dependence.   - pain control prn  - advance diet as tolerated  - follow-up pending surgical pathology  - monitor Right flank erythema, may require antibiotics despite normalized WBC  - continue to monitor abdominal exam and ostomy function  -  medical management of comorbidities  - ambulation encouraged  - DVT prophylaxis  All of the above findings and recommendations were discussed with the patient, patient's family, and the medical team, and all of patient's and family's questions were answered to their expressed satisfaction.  -- Marilynne Drivers Rosana Hoes, MD, [place]: [facility] - Partnering for exceptional care. Office: [phone]

## 2017-04-18 NOTE — Progress Notes (Signed)
   04/18/17 1400  Clinical Encounter Type  Visited With Patient;Family;Patient and family together  Visit Type Follow-up;Other (Comment) (HCPOA)  Referral From Family  Spiritual Encounters  Spiritual Needs Literature;Emotional  [place] paged to review HCPOA and contact notary and witnesses to complete HCPOA.  HCPOA notarized and now in pt chart.

## 2017-04-18 NOTE — Progress Notes (Signed)
[facility] [place] NAME: Kimberly [name]    MR#:  [id number]  DATE OF BIRTH:  1945/10/30  SUBJECTIVE:   Patient is postop day 3. She already ambulated . Doing well.  PCA pump -morphine  Discontinued. Removed NG Tolerating CLD dter in the room REVIEW OF SYSTEMS:   Review of Systems  Constitutional: Positive for weight loss. Negative for chills and fever.  HENT: Negative for ear discharge, ear pain and nosebleeds.   Eyes: Negative for blurred vision, pain and discharge.  Respiratory: Negative for sputum production, shortness of breath, wheezing and stridor.   Cardiovascular: Negative for chest pain, palpitations, orthopnea and PND.  Gastrointestinal: Positive for abdominal pain and vomiting. Negative for diarrhea and nausea.  Genitourinary: Negative for frequency and urgency.  Musculoskeletal: Negative for back pain and joint pain.  Neurological: Positive for weakness. Negative for sensory change, speech change and focal weakness.  Psychiatric/Behavioral: Negative for depression and hallucinations. The patient is not nervous/anxious.    Tolerating Diet:CLD Tolerating PT: Ambulatory  DRUG ALLERGIES:   Allergies  Allergen Reactions  . Sinus Formula [Cholestatin] Nausea Only  . Iodine Other (See Comments)    Topical iodine she states "if you put it in an open sore it will cause a eating ulcer."    VITALS:  Blood pressure 129/72, pulse 78, temperature 97.8 F (36.6 C), temperature source Oral, resp. rate 20, height 5\' 4"  (1.626 m), weight 67.1 kg (148 lb), SpO2 94 %.  PHYSICAL EXAMINATION:   Physical Exam  GENERAL:  71 y.o.-year-old patient lying in the bed with no acute distress.  EYES: Pupils equal, round, reactive to light and accommodation. No scleral icterus. Extraocular muscles intact.  HEENT: Head atraumatic, normocephalic. Oropharynx and nasopharynx clear. NG tube  NECK:  Supple, no jugular venous distention. No  thyroid enlargement, no tenderness.  LUNGS: Normal breath sounds bilaterally, no wheezing, rales, rhonchi. No use of accessory muscles of respiration.  CARDIOVASCULAR: S1, S2 normal. No murmurs, rubs, or gallops.  ABDOMEN: Soft, diffusely tender, distended. Bowel sounds present. No organomegaly or mass.Colostomy+ EXTREMITIES: No cyanosis, clubbing or edema b/l.    NEUROLOGIC: Cranial nerves II through XII are intact. No focal Motor or sensory deficits b/l.   PSYCHIATRIC:  patient is alert and oriented x 3.  SKIN: No obvious rash, lesion, or ulcer.   LABORATORY PANEL:  CBC  Recent Labs Lab 04/18/17 0512  WBC 11.6*  HGB 11.0*  HCT 33.1*  PLT 292    Chemistries   Recent Labs Lab 04/14/17 2212  04/18/17 0512  NA 136  < > 136  K 4.2  < > 3.4*  CL 97*  < > 107  CO2 27  < > 25  GLUCOSE 145*  < > 75  BUN 17  < > 8  CREATININE 0.90  < > 0.51  CALCIUM 8.9  < > 7.4*  AST 22  --   --   ALT 18  --   --   ALKPHOS 134*  --   --   BILITOT 0.8  --   --   < > = values in this interval not displayed. Cardiac Enzymes No results for input(s): TROPONINI in the last 168 hours. RADIOLOGY:  No results found. ASSESSMENT AND PLAN:  Kimberly [name] is a 71 y.o. female who presents to the ED from home with a chief complaint of abdominal pain and vomiting. Patient is scheduled for colonoscopy in the morning by Dr. Allen Norris.  She had an obstructive process seen on CT scan recently and scheduled for colonoscopy to evaluate for mass. Patient started her bowel prep approximately 5 PM and has had severe generalized abdominal pain with nausea/vomiting 2.  1. SBO due to colon mass, transverse noted on CT abdomen  -unable to tolerated colon prep. -Surgical consultation appreciated. Pt is  S/p Exploratory laparotomy, transverse colectomy, mobilization of hepatic flexure, end colostomy and mucous fistula creation POD # 3 -now on CLD -IVF -Morphine PCA pump per surgery recommendation--d/ced -Discussed with Dr.  Luana Shu regarding path results. She will review it with another pathologist before giving final results. For now she thinks this is colon cancer.  2. HL  3. H/o breast cancer in the past  4. DVT prophylaxis Lovenonx  Case discussed with Care Management/Social Worker. Management plans discussed with the patient, family and they are in agreement.  CODE STATUS: full  DVT Prophylaxis: lovenox  TOTAL TIME TAKING CARE OF THIS PATIENT: 30 minutes.  >50% time spent on counselling and coordination of care  POSSIBLE D/C IN 1-2 DAYS, DEPENDING ON CLINICAL CONDITION.  Note: This dictation was prepared with Dragon dictation along with smaller phrase technology. Any transcriptional errors that result from this process are unintentional.  Darric Plante M.D on 04/18/2017 at 11:39 AM  Between 7am to 6pm - Pager - [phone]  After 6pm go to [email or web address] - password [email or web address] [facility]  Office  [phone]  CC: Primary care physician; Birdie Sons, MD

## 2017-04-18 NOTE — Progress Notes (Unsigned)
D

## 2017-04-18 NOTE — Progress Notes (Signed)
Pt was able to demonstrate emptying of colostomy bag without assistance.

## 2017-04-18 NOTE — Consult Note (Addendum)
[facility] Nurse ostomy follow up Surgery is following for assessment and plan of care to abd wound. Stoma type/location: Transverse end colostomy and MF.  Stomal assessment/size: RLQ Colostomy oval shaped, edematous, 1 3/4 inches, slightly above skin level, 100% dark slough covering the stoma  LLQ Mucus fistula, 50% old clotted blood on stoma, 50% red, 1 1/2 inches, raised above skin level Peristomal assessment: intact Midline abdominal incision with dry dressing in place Treatment options for stomal/peristomal skin: barrier ring to ostomy to attempt to maintain seal  Output: Mod amt soft liquid stool from colostomy Ostomy pouching: 2pc. 2 3/4" pouch with barrier ring  Education provided: Demonstrated pouch change performed with daughter at bedside. Discussed emptying when 1/3 full.  Pt states she was able to empty independently this am and assisted with pouch application. Removed pouch over mucous fistula and applied dressing. Small amt tan mucous drainage from location.  Discussed function of barrier ring and that dark stomal tissue with likely slough off and that stoma is functioning; pt should follow-up with surgeon for further assessment after discharge.  Reviewed pouching routines and ordering supplies; pt and daughter deny further questions.  4 extra pouches and barriers left at bedside for discharge.  Pt could benefit from follow-up with home health after discharge.   Enrolled patient in [place] program: Yes Julien Girt MSN, RN, [place], [place], [place]

## 2017-04-18 NOTE — Care Management Important Message (Signed)
Important Message  Patient Details  Name: Kimberly [name] MRN: [id number] Date of Birth: 02/02/1946   Medicare Important Message Given:  Yes    Beverly Sessions, RN 04/18/2017, 4:48 PM

## 2017-04-19 DIAGNOSIS — Z23 Encounter for immunization: Secondary | ICD-10-CM | POA: Diagnosis not present

## 2017-04-19 LAB — CBC WITH DIFFERENTIAL/PLATELET
BASOS ABS: 0 10*3/uL (ref 0–0.1)
BASOS PCT: 0 %
Eosinophils Absolute: 0.1 10*3/uL (ref 0–0.7)
Eosinophils Relative: 1 %
HEMATOCRIT: 31.2 % — AB (ref 35.0–47.0)
HEMOGLOBIN: 10.5 g/dL — AB (ref 12.0–16.0)
Lymphocytes Relative: 17 %
Lymphs Abs: 1.7 10*3/uL (ref 1.0–3.6)
MCH: 29.9 pg (ref 26.0–34.0)
MCHC: 33.8 g/dL (ref 32.0–36.0)
MCV: 88.5 fL (ref 80.0–100.0)
Monocytes Absolute: 1.2 10*3/uL — ABNORMAL HIGH (ref 0.2–0.9)
Monocytes Relative: 12 %
NEUTROS ABS: 6.8 10*3/uL — AB (ref 1.4–6.5)
Neutrophils Relative %: 70 %
Platelets: 309 10*3/uL (ref 150–440)
RBC: 3.52 MIL/uL — ABNORMAL LOW (ref 3.80–5.20)
RDW: 19.4 % — ABNORMAL HIGH (ref 11.5–14.5)
WBC: 9.7 10*3/uL (ref 3.6–11.0)

## 2017-04-19 MED ORDER — BOOST / RESOURCE BREEZE PO LIQD: 1.0000 | Freq: Three times a day (TID) | ORAL | 0 refills | Status: DC

## 2017-04-19 MED ORDER — NICOTINE 21 MG/24HR TD PT24: 21.0000 mg | MEDICATED_PATCH | Freq: Every day | TRANSDERMAL | 0 refills | Status: DC

## 2017-04-19 NOTE — Progress Notes (Signed)
Patient discharge teaching given, including activity, diet, follow-up appoints, and medications. Patient verbalized understanding of all discharge instructions. Pt was taught how to empty and change colostomy bag, pt demonstrated understanding with teach back. Pt was also taught how to change her midline incision and fistula on the left side. Pt verbalized understanding on how to change dressings. Pt.'s daughter was also at the bedside when RN was changing dressings and verbalized understanding. IV access was d/c'd. Vitals are stable. Skin is intact except as charted in most recent assessments. Pt to be escorted out by NT, to be driven home by family.  Diana Armijo [employer]

## 2017-04-19 NOTE — Discharge Summary (Signed)
[facility] at [place] NAME: Kimberly [name]    MR#:  [id number]  DATE OF BIRTH:  Apr 14, 1946  DATE OF ADMISSION:  04/14/2017 ADMITTING PHYSICIAN: Harrie Foreman, MD  DATE OF DISCHARGE: 04/19/17  PRIMARY CARE PHYSICIAN: Birdie Sons, MD    ADMISSION DIAGNOSIS:  Generalized abdominal pain [R10.84] Hypoxia [R09.02] Non-intractable vomiting with nausea, unspecified vomiting type [R11.2]  DISCHARGE DIAGNOSIS:  Small bowel obstruction due to colon mas status post exploratory laparotomy, transverse colectomy, and colostomy and mucous fistula creation Adenocarcinoma of the colon--- new  SECONDARY DIAGNOSIS:   Past Medical History:  Diagnosis Date  . Breast cancer, left (HCC)    Lumpectomy and rad tx's.  . Chronic back pain   . Chronic knee pain   . Degenerative disc disease, lumbar 04/2013  . Hyperlipidemia   . Neuropathy   . Osteoarthritis    of knee  . Osteoporosis   . Tobacco abuse   . Vitamin D deficiency     HOSPITAL COURSE:   *Kimberly [name] a 71 y.o.femalewho presents to the ED from home with a chief complaint of abdominal pain and vomiting. Patient is scheduled for colonoscopy in the morning by Dr. Allen Norris. She had an obstructive process seen on CT scan recently and scheduled for colonoscopy to evaluate for mass. Patient started her bowel prep approximately 5 PM and has had severe generalized abdominal pain with nausea/vomiting 2.  1. SBO due to colon mass, transverse noted on CT abdomen  -unable to tolerated colon prep. -Surgical consultation appreciated. Pt is  S/p Exploratory laparotomy, transverse colectomy, mobilization of hepatic flexure, end colostomy and mucous fistula creation POD # 4 -now on CLD---tolerating diet very well. -IVF -Morphine PCA pump per surgery recommendation--d/ced -Discussed with Dr. Rosana Hoes. Patient will discharge to home and follow up with oncology Dr Miachel Roux. - Surgical pathology shows  adenocarcinoma.  2. HL  3. H/o breast cancer in the past  4. DVT prophylaxis Lovenonx  Home health RN arrange for colostomy education and help. Discharge plan was discussed with patient and daughter. They will follow up with surgery and oncology as outpatient. CONSULTS OBTAINED:  Treatment Team:  [name], MD Clayburn Pert, MD  DRUG ALLERGIES:   Allergies  Allergen Reactions  . Sinus Formula [Cholestatin] Nausea Only  . Iodine Other (See Comments)    Topical iodine she states "if you put it in an open sore it will cause a eating ulcer."    DISCHARGE MEDICATIONS:   Current Discharge Medication List    START taking these medications   Details  feeding supplement (BOOST / RESOURCE BREEZE) LIQD Take 1 Container by mouth 3 (three) times daily between meals. Qty: 30 Container, Refills: 0    nicotine (NICODERM CQ - DOSED IN MG/24 HOURS) 21 mg/24hr patch Place 1 patch (21 mg total) onto the skin daily. Qty: 28 patch, Refills: 0      CONTINUE these medications which have NOT CHANGED   Details  budesonide-formoterol (SYMBICORT) 80-4.5 MCG/ACT inhaler Inhale 2 puffs into the lungs 2 (two) times daily.    Calcium Citrate-Vitamin D (CITRACAL/VITAMIN D PO) Take by mouth.    docusate sodium (COLACE) 100 MG capsule Take 1 capsule (100 mg total) by mouth daily. Qty: 14 capsule, Refills: 0   Associated Diagnoses: Pain of upper abdomen; Chronic, continuous use of opioids    gabapentin (NEURONTIN) 400 MG capsule Take 400 mg by mouth 3 (three) times daily.     HYDROcodone-acetaminophen (  NORCO) 7.5-325 MG tablet Take 1-2 tablets by mouth every 6 (six) hours as needed for moderate pain. Qty: 120 tablet, Refills: 0   Associated Diagnoses: DDD (degenerative disc disease), lumbar    meloxicam (MOBIC) 15 MG tablet TAKE 1 TABLET EVERY DAY AS NEEDED Qty: 90 tablet, Refills: 3    naproxen (NAPROSYN) 500 MG tablet Take 500 mg by mouth 2 (two) times daily with a meal.     omeprazole (PRILOSEC) 20 MG capsule TAKE 1 CAPSULE EVERY DAY Qty: 90 capsule, Refills: 4    pravastatin (PRAVACHOL) 40 MG tablet TAKE 1 TABLET (40 MG TOTAL) BY MOUTH DAILY. Qty: 90 tablet, Refills: 4   Associated Diagnoses: Hyperlipidemia    Probiotic Product (PROBIOTIC DAILY PO) Take by mouth.    Vitamin D, Ergocalciferol, (DRISDOL) 50000 units CAPS capsule Take 1 capsule (50,000 Units total) by mouth every 7 (seven) days. Qty: 12 capsule, Refills: 0        If you experience worsening of your admission symptoms, develop shortness of breath, life threatening emergency, suicidal or homicidal thoughts you must seek medical attention immediately by calling 911 or calling your MD immediately  if symptoms less severe.  You Must read complete instructions/literature along with all the possible adverse reactions/side effects for all the Medicines you take and that have been prescribed to you. Take any new Medicines after you have completely understood and accept all the possible adverse reactions/side effects.   Please note  You were cared for by a hospitalist during your hospital stay. If you have any questions about your discharge medications or the care you received while you were in the hospital after you are discharged, you can call the unit and asked to speak with the hospitalist on call if the hospitalist that took care of you is not available. Once you are discharged, your primary care physician will handle any further medical issues. Please note that NO REFILLS for any discharge medications will be authorized once you are discharged, as it is imperative that you return to your primary care physician (or establish a relationship with a primary care physician if you do not have one) for your aftercare needs so that they can reassess your need for medications and monitor your lab values. Today   SUBJECTIVE   Doing well. Denies any complaints.  VITAL SIGNS:  Blood pressure 128/62, pulse  72, temperature (!) 97.5 F (36.4 C), temperature source Oral, resp. rate 18, height 5\' 4"  (1.626 m), weight 67.1 kg (148 lb), SpO2 93 %.  I/O:   Intake/Output Summary (Last 24 hours) at 04/19/17 1301 Last data filed at 04/19/17 1018  Gross per 24 hour  Intake              600 ml  Output             2200 ml  Net            -1600 ml    PHYSICAL EXAMINATION:  GENERAL:  71 y.o.-year-old patient lying in the bed with no acute distress.  EYES: Pupils equal, round, reactive to light and accommodation. No scleral icterus. Extraocular muscles intact.  HEENT: Head atraumatic, normocephalic. Oropharynx and nasopharynx clear.  NECK:  Supple, no jugular venous distention. No thyroid enlargement, no tenderness.  LUNGS: Normal breath sounds bilaterally, no wheezing, rales,rhonchi or crepitation. No use of accessory muscles of respiration.  CARDIOVASCULAR: S1, S2 normal. No murmurs, rubs, or gallops.  ABDOMEN: Soft, non-tender, non-distended. Bowel sounds present. No organomegaly or mass.  Colostomy, mucous fistula pouch EXTREMITIES: No pedal edema, cyanosis, or clubbing.  NEUROLOGIC: Cranial nerves II through XII are intact. Muscle strength 5/5 in all extremities. Sensation intact. Gait not checked.  PSYCHIATRIC: The patient is alert and oriented x 3.  SKIN: No obvious rash, lesion, or ulcer.   DATA REVIEW:   CBC   Recent Labs Lab 04/19/17 0533  WBC 9.7  HGB 10.5*  HCT 31.2*  PLT 309    Chemistries   Recent Labs Lab 04/14/17 2212  04/18/17 0512  NA 136  < > 136  K 4.2  < > 3.4*  CL 97*  < > 107  CO2 27  < > 25  GLUCOSE 145*  < > 75  BUN 17  < > 8  CREATININE 0.90  < > 0.51  CALCIUM 8.9  < > 7.4*  AST 22  --   --   ALT 18  --   --   ALKPHOS 134*  --   --   BILITOT 0.8  --   --   < > = values in this interval not displayed.  Microbiology Results   No results found for this or any previous visit (from the past 240 hour(s)).  RADIOLOGY:  No results found.   Management  plans discussed with the patient, family and they are in agreement.  CODE STATUS:     Code Status Orders        Start     Ordered   04/15/17 0318  Full code  Continuous     04/15/17 0317    Code Status History    Date Active Date Inactive Code Status Order ID Comments User Context   This patient has a current code status but no historical code status.      TOTAL TIME TAKING CARE OF THIS PATIENT: *40* minutes.    Jaryah Aracena M.D on 04/19/2017 at 1:01 PM  Between 7am to 6pm - Pager - [phone] After 6pm go to [email or web address] - password [email or web address] [facility]  Office  [phone]  CC: Primary care physician; Birdie Sons, MD

## 2017-04-19 NOTE — Discharge Instructions (Signed)
In addition to included general post-operative instructions for Partial Colectomy with Colostomy for Obstructing Colon Cancer,  Diet: Gradually resume home heart healthy diet.   Activity: No heavy lifting >20 pounds (children, pets, laundry, garbage) or strenuous activity until follow-up, but light activity and walking are encouraged. Do not drive or drink alcohol if taking narcotic pain medications.  Wound care: Okay to shower/get incision wet with soapy water and pat dry (do not rub incisions), but no baths or submerging incisions underwater until follow-up.   Medications: Resume all home medications. For mild to moderate pain: acetaminophen (Tylenol) or ibuprofen (if no kidney disease). Combining Tylenol with alcohol can substantially increase your risk of causing liver disease. Narcotic pain medications, if prescribed, can be used for severe pain, though may cause nausea, constipation, and drowsiness. Do not combine Tylenol and Percocet within a 6 hour period as Percocet contains Tylenol. If you do not need the narcotic pain medication, you do not need to fill the prescription.  Call office [phone]) at any time if any questions, worsening pain, fevers/chills, bleeding, drainage from incision site, or other concerns.

## 2017-04-19 NOTE — Progress Notes (Signed)
[facility] Day(s): 3.   Post op day(s): 4 Days Post-Op.   Interval History: Patient seen and examined, no acute events or new complaints overnight. Patient reports her Right flank pain/redness and B/L lower extremity swelling have both improve. Meanwhile, she's been tolerating her diet with appropriate gas and stool from colostomy and without N/V, fever/chills, CP, or SOB. She has also been ambulating in the halls.  Review of Systems:  Constitutional: denies fever, chills  HEENT: denies cough or congestion  Respiratory: denies any shortness of breath  Cardiovascular: denies chest pain or palpitations  Gastrointestinal: abdominal pain, N/V, and bowel function as per interval history Genitourinary: denies burning with urination or urinary frequency Musculoskeletal: denies pain, decreased motor or sensation Integumentary: denies any other rashes or skin discolorations except post-surgical abdominal wounds Neurological: denies HA or vision/hearing changes   Vital signs in last 24 hours: [min-max] current  Temp:  [97.5 F (36.4 C)-98.4 F (36.9 C)] 97.5 F (36.4 C) (07/21 0433) Pulse Rate:  [72-91] 72 (07/21 0433) Resp:  [18-20] 18 (07/21 0433) BP: (113-128)/(62-72) 128/62 (07/21 0433) SpO2:  [93 %] 93 % (07/21 0433)     Height: 5\' 4"  (162.6 cm) Weight: 148 lb (67.1 kg) BMI (Calculated): 24.9   Intake/Output this shift:  No intake/output data recorded.   Intake/Output last 2 shifts:  @IOLAST2SHIFTS @   Physical Exam:  Constitutional: alert, cooperative and no distress  HENT: normocephalic without obvious abnormality  Eyes: PERRL, EOM's grossly intact and symmetric  Neuro: CN II - XII grossly intact and symmetric without deficit  Respiratory: breathing non-labored at rest  Cardiovascular: regular rate and sinus rhythm  Gastrointestinal: soft, mild peri-incisional tenderness to palpation, incision well-approximated without erythema or drainage, ostomy with  gas and stool in the bag, and non-distended; improved/decreased Right flank tenderness and erythema  Musculoskeletal: UE and LE FROM, decreased symmetric B/L LE edema without tenderness to palpation or wounds, motor and sensation grossly intact  Labs:  CBC Latest Ref Rng & Units 04/19/2017 04/18/2017 04/17/2017  WBC 3.6 - 11.0 K/uL 9.7 11.6(H) 18.5(H)  Hemoglobin 12.0 - 16.0 g/dL 10.5(L) 11.0(L) 12.4  Hematocrit 35.0 - 47.0 % 31.2(L) 33.1(L) 38.0  Platelets 150 - 440 K/uL 309 292 326   CMP Latest Ref Rng & Units 04/18/2017 04/17/2017 04/16/2017  Glucose 65 - 99 mg/dL 75 78 94  BUN 6 - 20 mg/dL 8 12 11   Creatinine 0.44 - 1.00 mg/dL 0.51 0.47 0.53  Sodium 135 - 145 mmol/L 136 137 138  Potassium 3.5 - 5.1 mmol/L 3.4(L) 3.3(L) 3.5  Chloride 101 - 111 mmol/L 107 107 110  CO2 22 - 32 mmol/L 25 23 23   Calcium 8.9 - 10.3 mg/dL 7.4(L) 7.7(L) 7.3(L)  Total Protein 6.5 - 8.1 g/dL - - -  Total Bilirubin 0.3 - 1.2 mg/dL - - -  Alkaline Phos 38 - 126 U/L - - -  AST 15 - 41 U/L - - -  ALT 14 - 54 U/L - - -    Imaging studies: No new pertinent imaging studies   Assessment/Plan: 71 y.o. female with mild improved Right flank erythema and pain in the context of Right-side colostomy, resolved leukocytosis, and resolving post-operative ileus, otherwise doing well 4 Days Post-Op s/p partial transverse colectomy with creation of end-colostomy and mucus fistula for obstructing transverse colonic mass with palpable mesenteric masses 2 years s/p colonoscopy, complicated by pertinent comorbidities including HLD, remote history of Left breast cancer s/p lumpectomy with radiation therapy, osteoarthritis with  chronic lumbar back and knee pain, peripheral neuropathy, and chronic tobacco abuse/dependence.              - pain control prn             - advance diet as tolerated, PO meds             - pathology discussed with both oncologist and patient  - discussed with patient discharge instructions and follow-up  -  okay with discharge home from surgical perspective  - outpatient surgical follow-up in 1 - 2 weeks             - medical management of comorbidities             - ambulation encouraged             - DVT prophylaxis  All of the above findings and recommendations were discussed with the patient, patient's family, and the medical team, and all of patient's and family's questions were answered to their expressed satisfaction.  -- Marilynne Drivers Rosana Hoes, MD, [place]: [facility] - Partnering for exceptional care. Office: [phone]

## 2017-04-19 NOTE — Care Management Note (Signed)
Case Management Note  Patient Details  Name: Kimberly [name] MRN: [id number] Date of Birth: 10-12-1945  Subjective/Objective:        A referral was called to Melene Muller at [facility] for [facility] per [facility] choice.             Action/Plan:   Expected Discharge Date:  04/19/17               Expected Discharge Plan:     In-House Referral:     Discharge planning Services     Post Acute Care Choice:    Yes  Choice offered to:   Patient  DME Arranged:    DME Agency:     HH Arranged:   RN [place] Agency:   [facility]  Status of Service:   Completed  If discussed at [place] of Stay Meetings, dates discussed:    Additional Comments:  Vearl Allbaugh A, RN 04/19/2017, 1:49 PM

## 2017-04-21 ENCOUNTER — Emergency Department: Payer: Medicare HMO

## 2017-04-21 ENCOUNTER — Telehealth: Payer: Self-pay

## 2017-04-21 ENCOUNTER — Encounter: Payer: Self-pay | Admitting: Emergency Medicine

## 2017-04-21 ENCOUNTER — Emergency Department
Admission: EM | Admit: 2017-04-21 | Discharge: 2017-04-21 | Disposition: A | Discharge status: Home / Self Care | Payer: Medicare HMO | Attending: Emergency Medicine | Admitting: Emergency Medicine

## 2017-04-21 DIAGNOSIS — R1031 Right lower quadrant pain: Secondary | ICD-10-CM | POA: Diagnosis not present

## 2017-04-21 DIAGNOSIS — Z853 Personal history of malignant neoplasm of breast: Secondary | ICD-10-CM | POA: Insufficient documentation

## 2017-04-21 DIAGNOSIS — L03311 Cellulitis of abdominal wall: Secondary | ICD-10-CM | POA: Diagnosis not present

## 2017-04-21 DIAGNOSIS — G8918 Other acute postprocedural pain: Secondary | ICD-10-CM

## 2017-04-21 DIAGNOSIS — F17210 Nicotine dependence, cigarettes, uncomplicated: Secondary | ICD-10-CM | POA: Insufficient documentation

## 2017-04-21 DIAGNOSIS — L0390 Cellulitis, unspecified: Secondary | ICD-10-CM | POA: Insufficient documentation

## 2017-04-21 DIAGNOSIS — G8929 Other chronic pain: Secondary | ICD-10-CM | POA: Diagnosis not present

## 2017-04-21 DIAGNOSIS — R109 Unspecified abdominal pain: Secondary | ICD-10-CM

## 2017-04-21 DIAGNOSIS — C189 Malignant neoplasm of colon, unspecified: Secondary | ICD-10-CM | POA: Diagnosis not present

## 2017-04-21 LAB — COMPREHENSIVE METABOLIC PANEL
ALT: 16 U/L (ref 14–54)
ANION GAP: 9 (ref 5–15)
AST: 20 U/L (ref 15–41)
Albumin: 2.2 g/dL — ABNORMAL LOW (ref 3.5–5.0)
Alkaline Phosphatase: 97 U/L (ref 38–126)
BUN: 7 mg/dL (ref 6–20)
CALCIUM: 8.2 mg/dL — AB (ref 8.9–10.3)
CHLORIDE: 99 mmol/L — AB (ref 101–111)
CO2: 26 mmol/L (ref 22–32)
CREATININE: 0.51 mg/dL (ref 0.44–1.00)
Glucose, Bld: 95 mg/dL (ref 65–99)
Potassium: 3.9 mmol/L (ref 3.5–5.1)
SODIUM: 134 mmol/L — AB (ref 135–145)
Total Bilirubin: 0.6 mg/dL (ref 0.3–1.2)
Total Protein: 5.6 g/dL — ABNORMAL LOW (ref 6.5–8.1)

## 2017-04-21 LAB — URINALYSIS, COMPLETE (UACMP) WITH MICROSCOPIC
BILIRUBIN URINE: NEGATIVE
Bacteria, UA: NONE SEEN
GLUCOSE, UA: NEGATIVE mg/dL
HGB URINE DIPSTICK: NEGATIVE
Ketones, ur: NEGATIVE mg/dL
Leukocytes, UA: NEGATIVE
NITRITE: NEGATIVE
Protein, ur: NEGATIVE mg/dL
SPECIFIC GRAVITY, URINE: 1.004 — AB (ref 1.005–1.030)
Squamous Epithelial / LPF: NONE SEEN
WBC, UA: NONE SEEN WBC/hpf (ref 0–5)
pH: 7 (ref 5.0–8.0)

## 2017-04-21 LAB — CBC
HCT: 35.8 % (ref 35.0–47.0)
Hemoglobin: 11.8 g/dL — ABNORMAL LOW (ref 12.0–16.0)
MCH: 28.8 pg (ref 26.0–34.0)
MCHC: 32.9 g/dL (ref 32.0–36.0)
MCV: 87.4 fL (ref 80.0–100.0)
PLATELETS: 393 10*3/uL (ref 150–440)
RBC: 4.1 MIL/uL (ref 3.80–5.20)
RDW: 19.8 % — ABNORMAL HIGH (ref 11.5–14.5)
WBC: 18.5 10*3/uL — ABNORMAL HIGH (ref 3.6–11.0)

## 2017-04-21 LAB — LIPASE, BLOOD: LIPASE: 17 U/L (ref 11–51)

## 2017-04-21 MED ORDER — PIPERACILLIN-TAZOBACTAM 3.375 G IVPB 30 MIN
INTRAVENOUS | Status: AC
  Administered 2017-04-21: 3.375 g via INTRAVENOUS
  Filled 2017-04-21: qty 50

## 2017-04-21 MED ORDER — HYDROMORPHONE HCL 1 MG/ML IJ SOLN
0.5000 mg | Freq: Once | INTRAMUSCULAR | Status: AC
  Administered 2017-04-21: 0.5 mg via INTRAVENOUS
  Filled 2017-04-21: qty 1

## 2017-04-21 MED ORDER — PIPERACILLIN-TAZOBACTAM 3.375 G IVPB 30 MIN
3.3750 g | Freq: Once | INTRAVENOUS | Status: AC
  Administered 2017-04-21: 3.375 g via INTRAVENOUS

## 2017-04-21 MED ORDER — SODIUM CHLORIDE 0.9 % IV BOLUS (SEPSIS)
1000.0000 mL | Freq: Once | INTRAVENOUS | Status: AC
  Administered 2017-04-21: 1000 mL via INTRAVENOUS

## 2017-04-21 MED ORDER — BARIUM SULFATE 2.1 % PO SUSP
450.0000 mL | ORAL | Status: AC
  Administered 2017-04-21 (×2): 450 mL via ORAL

## 2017-04-21 MED ORDER — LACTATED RINGERS IV BOLUS (SEPSIS)
1000.0000 mL | Freq: Once | INTRAVENOUS | Status: DC
  Filled 2017-04-21: qty 1000

## 2017-04-21 MED ORDER — AMOXICILLIN-POT CLAVULANATE 875-125 MG PO TABS: 1.0000 | ORAL_TABLET | Freq: Two times a day (BID) | ORAL | 0 refills | Status: DC

## 2017-04-21 MED ORDER — IOPAMIDOL (ISOVUE-300) INJECTION 61%
100.0000 mL | Freq: Once | INTRAVENOUS | Status: AC | PRN
  Administered 2017-04-21: 100 mL via INTRAVENOUS

## 2017-04-21 NOTE — ED Notes (Signed)
Attempt for PIV X 2 by RN unsuccessful.

## 2017-04-21 NOTE — ED Notes (Signed)
Patient transported to CT 

## 2017-04-21 NOTE — ED Notes (Signed)
Pt dressings and ostomy bag changed.

## 2017-04-21 NOTE — Telephone Encounter (Signed)
[place] at this time. Patient was concerned about incision site drainage as well as colostomy bag being placed correctly. She wanted to know if [facility] would be coming out today due to her concerns. I spoke with [facility] and was told that they will not be able to come today due to not being able to contact patient over the weekend. She stated that they tried calling her emergency contacts and left a few messages but due to no responds was unable to get her on the schedule for today. Stephaine did ask for a better contact number which I gave her and said that she will contact the patient to verify needed information to get her scheduled for a later visit this week. I will call patient back at this time to inform her of this information.   Contacted patient at this time to inform her that [facility] will not be able to come out the see her today due to not being able to contact her over the weekend. I told her that I gave them the correct number to contact her. I also told her to go ahead and go to the ED if she was concerned about her incision site and the colostomy bag not being attached correctly. She verbalized understanding at this time and stated that the bottom of the colostomy bag is not sticking that well. I told her that she should ask for Domenic Moras when she got to the ED if she was available to help her with the colostomy bag.

## 2017-04-21 NOTE — Consult Note (Signed)
Patient ID: Kimberly [name], female   DOB: 1946/03/06, 71 y.o.   MRN: [id number]  HPI Kimberly [name] is a 71 y.o. female asked to see in consultation by Dr. Mariea Clonts. She is very well known to our service and had a transverse colectomy with end colostomy and mucous fistula by Dr. Adonis Huguenin on 7/17. She went home 2 days ago and was doing well Until today. She started having significant right-sided abdominal pain. She is been able to tolerate regular diet and her colostomy is working. No fevers no chills no shortness of breath. CT scan performed on personal review there is evidence of a small area of induration to the right side of the end colostomy. Colostomy is working and there is no evidence of obstruction. There is no evidence of free air. Labs reviewed there is evidence of an increase in the white count to 18,000. Normal creatinine. Hypoalbuminemia Case d/w Dr. Archie Balboa in detail. Smoking up to the time of most recent hospitalization  HPI  Past Medical History:  Diagnosis Date  . Breast cancer, left (HCC)    Lumpectomy and rad tx's.  . Chronic back pain   . Chronic knee pain   . Degenerative disc disease, lumbar 04/2013  . Hyperlipidemia   . Neuropathy   . Osteoarthritis    of knee  . Osteoporosis   . Tobacco abuse   . Vitamin D deficiency     Past Surgical History:  Procedure Laterality Date  . APPENDECTOMY  1965  . BREAST EXCISIONAL BIOPSY Left 08/01/2003   lumpectomy rad 11/04-2/28/2005  . CESAREAN SECTION     x3  . COLONOSCOPY W/ POLYPECTOMY    . ESOPHAGOGASTRODUODENOSCOPY (EGD) WITH PROPOFOL N/A 04/04/2017   Procedure: ESOPHAGOGASTRODUODENOSCOPY (EGD) WITH PROPOFOL;  Surgeon: [name], MD;  Location: [facility];  Service: Endoscopy;  Laterality: N/A;  . HIP FRACTURE SURGERY Left 01/25/2012  . LAPAROTOMY N/A 04/15/2017   Procedure: EXPLORATORY LAPAROTOMY;  Surgeon: Clayburn Pert, MD;  Location: [facility];  Service: General;  Laterality: N/A;  . NECK SURGERY  12/2011  .  WRIST FRACTURE SURGERY      Family History  Problem Relation Age of Onset  . Diabetes Mother        type 2  . Coronary artery disease Mother   . Deep vein thrombosis Mother   . Cancer Father   . Asthma Brother   . Diabetes Brother        type 2    Social History Social History  Substance Use Topics  . Smoking status: Current Every Day Smoker    Packs/day: 0.50    Years: 55.00    Types: Cigarettes  . Smokeless tobacco: Never Used     Comment: started age 44 1/2 to 1 ppd  . Alcohol use 0.0 oz/week     Comment: occasionally drinks beer    Allergies  Allergen Reactions  . Sinus Formula [Cholestatin] Nausea Only  . Iodine Other (See Comments)    Topical iodine she states "if you put it in an open sore it will cause a eating ulcer."    Current Facility-Administered Medications  Medication Dose Route Frequency Provider Last Rate Last Dose  . lactated ringers bolus 1,000 mL  1,000 mL Intravenous Once Pabon, Diego F, MD      . piperacillin-tazobactam (ZOSYN) IVPB 3.375 g  3.375 g Intravenous Once Pabon, [place] F, MD 100 mL/hr at 04/21/17 1620 3.375 g at 04/21/17 1620   Current Outpatient Prescriptions  Medication Sig  Dispense Refill  . budesonide-formoterol (SYMBICORT) 80-4.5 MCG/ACT inhaler Inhale 2 puffs into the lungs 2 (two) times daily.    . Calcium Citrate-Vitamin D (CITRACAL/VITAMIN D PO) Take by mouth.    . docusate sodium (COLACE) 100 MG capsule Take 1 capsule (100 mg total) by mouth daily. 14 capsule 0  . feeding supplement (BOOST / RESOURCE BREEZE) LIQD Take 1 Container by mouth 3 (three) times daily between meals. 30 Container 0  . gabapentin (NEURONTIN) 400 MG capsule Take 400 mg by mouth 3 (three) times daily.     . meloxicam (MOBIC) 15 MG tablet TAKE 1 TABLET EVERY DAY AS NEEDED 90 tablet 3  . naproxen (NAPROSYN) 500 MG tablet Take 500 mg by mouth 2 (two) times daily with a meal.    . nicotine (NICODERM CQ - DOSED IN MG/24 HOURS) 21 mg/24hr patch Place 1 patch  (21 mg total) onto the skin daily. 28 patch 0  . omeprazole (PRILOSEC) 20 MG capsule TAKE 1 CAPSULE EVERY DAY 90 capsule 4  . pravastatin (PRAVACHOL) 40 MG tablet TAKE 1 TABLET (40 MG TOTAL) BY MOUTH DAILY. 90 tablet 4  . Probiotic Product (PROBIOTIC DAILY PO) Take by mouth.    . Vitamin D, Ergocalciferol, (DRISDOL) 50000 units CAPS capsule Take 1 capsule (50,000 Units total) by mouth every 7 (seven) days. 12 capsule 0  . HYDROcodone-acetaminophen (NORCO) 7.5-325 MG tablet Take 1-2 tablets by mouth every 6 (six) hours as needed for moderate pain. 120 tablet 0     Review of Systems Full ROS  was asked and was negative except for the information on the HPI  Physical Exam Blood pressure (!) 166/89, pulse 74, temperature 98.7 F (37.1 C), temperature source Oral, resp. rate 18, height 5\' 4"  (1.626 m), weight 67.1 kg (148 lb), SpO2 95 %. CONSTITUTIONAL: debilitated female in NAD EYES: Pupils are equal, round, and reactive to light, Sclera are non-icteric. EARS, NOSE, MOUTH AND THROAT: The oropharynx is clear. The oral mucosa is pink and moist. Hearing is intact to voice. LYMPH NODES:  Lymph nodes in the neck are normal. RESPIRATORY:  Lungs are clear. There is normal respiratory effort, with equal breath sounds bilaterally, and without pathologic use of accessory muscles. CARDIOVASCULAR: Heart is regular without murmurs, gallops, or rubs. GI: The abdomen is  soft, mild tenderness to palpation on RLQ, some induration and cellulitis. Midline wound w staples an some erythema. no drainable collection. End colostomy w superficial necrosis, it is patent.  Mucous fistula intact. GU: Rectal deferred.   MUSCULOSKELETAL: Normal muscle strength and tone. No cyanosis or edema.   SKIN: Turgor is good and there are no pathologic skin lesions or ulcers. NEUROLOGIC: Motor and sensation is grossly normal. Cranial nerves are grossly intact. PSYCH:  Oriented to person, place and time. Affect is normal.  Data  Reviewed  I have personally reviewed the patient's imaging, laboratory findings and medical records.    Assessment/ Plan Cellulitis of abdominal wall. I have discussed with the patient and the daughter in detail about the CT findings there is a small area of induration to the right side of her colostomy consistent with cellulitis. No evidence of a drainable collection or any acute intra-abdominal abscess. No evidence of free air. Patient is not toxic and wishes to go home. I offered her to be admitted to the hospital and place her on IV antibiotics but the patient is adamant that she wishes to stay at home. After a lengthy discussion with the patient and the  daughter  we decided that we will hydrate her here in the hospital ,I ordered another a bolus of crystalloids and I started 1 dose of IV Zosyn. She is to be sent home on Augmentin for 2 weeks. She will follow up with Dr. Adonis Huguenin this coming Friday in the office. I discussed with her that as far as her colostomy, there is evidence of some necrosis but at this point in time revising her will require a major operation and currently she is malnourished and not in the best shape. Since the colostomy is functional and there is no retraction I'll like to manage her conservatively and make sure that we see her back in the next few days. At this time there is no evidence for emergent surgical indication at this time. Extensive counseling provided.   [name] Hamman, MD FACS General Surgeon 04/21/2017, 4:25 PM

## 2017-04-21 NOTE — ED Notes (Signed)
Called CT and notified that pt was finished with contrast

## 2017-04-21 NOTE — ED Notes (Signed)
Report received, care assumed. Pt alert and oriented. Drinking barium

## 2017-04-21 NOTE — Telephone Encounter (Signed)
Post-op call made to patient at this time. Spoke with [employer]. Post-op interview questions below.  1. How are you feeling? Having some soreness and pain  2. Is your pain controlled? Somewhat  3. What are you doing for the pain? Taking pain medication took some last night  4. Are you having any Nausea or Vomiting? Feeling sick on the stomach but has a good appetit  5. Are you having any Fever or Chills? No  6. Are you having any Constipation or Diarrhea? Has a colostomy bag in place  7. Is there any Swelling or Bruising you are concerned about? Has some hardness around the colostomy bag and some soreness.  8. Do you have any questions or concerns at this time? Patient wanted to know when Home health would be coming out? I told her I Would give them a call and cal her back with this information.   Discussion: Insicion is having some red drainage that started yesterday and that the drainage isn't much but did have to change the dressing over there incison. Patient stated that she's not sure if she is changing the colostomy bag correctly stated that she is having some leakage from it.

## 2017-04-21 NOTE — ED Notes (Signed)
Will discharge when abx and fluids done

## 2017-04-21 NOTE — ED Notes (Signed)
Pt had ostomy surgery on Tuesday, discharged from hospital on Saturday. Pt noticed leaking from osotmy site this AM and states that she is afraid it has leaked onto surgical incision in middle of abdomen. Dressing removed, incision reddened at site, no foul odor smell. Will replace ostomy dressing and bag, awaiting supplies from supply chain.

## 2017-04-21 NOTE — ED Triage Notes (Signed)
Pt here post op from colostomy last Tuesday, was d/c on Saturday. Reports she thinks stool is leaking from her midline incision and possibly the colostomy bag. Pt also has open stoma on left side of abdomen, pt reports stoma is pink in color. Pt reports stoma to right side of stomach is black in color.

## 2017-04-21 NOTE — ED Provider Notes (Signed)
-----------------------------------------   5:04 PM on 04/21/2017 -----------------------------------------  Dr. Dahlia Byes has evaluated the patient. Recommended admission however patient declined. When I spoke to patient she again expressed that she would like to go home. Per Dr. Corlis Leak recommendation will start patient on Augmentin. Patient will follow up in surgical clinic this week. Discussed return precautions.    Nance Pear, MD 04/21/17 [id number]

## 2017-04-21 NOTE — ED Provider Notes (Signed)
[facility] Emergency Department Provider Note  ____________________________________________  Time seen: Approximately 1:32 PM  I have reviewed the triage vital signs and the nursing notes.   HISTORY  Chief Complaint Post-op Problem    HPI Kimberly [name] is a 71 y.o. female with recent admission to the hospital after transverse colectomy, and colostomy and mucous fistula creation for newly diagnosed adenocarcinoma of the colon presenting with abdominal pain and mass. The patient reports that at home, she palpated a mass to the right of her right lower quadrant ostomy and developed severe pain associated with it this morning. She has not had any nausea or vomiting. She does note some possible bloody output of her midline incision. She notes some purulent discharge in the wet-to-dry dressings that she is placing on her left lower quadrant mucous fistula, and no major changes out of her ostomy. She denies any fever or chills, dysuria.   Past Medical History:  Diagnosis Date  . Breast cancer, left (HCC)    Lumpectomy and rad tx's.  . Chronic back pain   . Chronic knee pain   . Degenerative disc disease, lumbar 04/2013  . Hyperlipidemia   . Neuropathy   . Osteoarthritis    of knee  . Osteoporosis   . Tobacco abuse   . Vitamin D deficiency     Patient Active Problem List   Diagnosis Date Noted  . Small bowel obstruction ([place]) 04/16/2017  . Bowel obstruction ([place]) 04/15/2017  . Generalized abdominal pain   . Abdominal pain, generalized   . Loss of weight   . Gastritis without bleeding   . Absence of bladder continence 04/01/2017  . Asthmatic breathing 04/01/2017  . Need for vaccination 04/01/2017  . Abnormal abdominal x-ray 02/11/2017  . Acute bacterial sinusitis 02/11/2017  . OP (osteoporosis) 02/11/2017  . Encounter for general adult medical examination without abnormal findings 02/11/2017  . Skin lesion 02/11/2017  . Screening breast examination  02/11/2017  . Breast swelling 02/11/2017  . Bronchitis 02/11/2017  . Bursitis of hip 02/11/2017  . Cervical cancer screening 02/11/2017  . Gonalgia 02/11/2017  . Conjunctivitis 02/11/2017  . Narrowing of intervertebral disc space 02/11/2017  . Accumulation of fluid in tissues 02/11/2017  . Family history of colon cancer 02/11/2017  . Foot pain 02/11/2017  . Flu vaccine need 02/11/2017  . Arthralgia of hip 02/11/2017  . Personal history of malignant neoplasm of breast 02/11/2017  . Received influenza vaccination at hospital 02/11/2017  . Eczema intertrigo 02/11/2017  . Neuropathy 02/11/2017  . Osteoarthrosis, unspecified whether generalized or localized, involving lower leg 02/11/2017  . Coronary atherosclerosis 01/29/2017  . Personal history of tobacco use, presenting hazards to health 01/21/2017  . Smoking greater than 30 pack years 01/10/2017  . Vitamin D deficiency 08/21/2016  . Hyperlipidemia 11/10/2015  . Chronic back pain 05/26/2015  . DDD (degenerative disc disease), lumbosacral 05/26/2015  . History of colon polyps 04/27/2007    Past Surgical History:  Procedure Laterality Date  . APPENDECTOMY  1965  . BREAST EXCISIONAL BIOPSY Left 08/01/2003   lumpectomy rad 11/04-2/28/2005  . CESAREAN SECTION     x3  . COLONOSCOPY W/ POLYPECTOMY    . ESOPHAGOGASTRODUODENOSCOPY (EGD) WITH PROPOFOL N/A 04/04/2017   Procedure: ESOPHAGOGASTRODUODENOSCOPY (EGD) WITH PROPOFOL;  Surgeon: [name], MD;  Location: [place];  Service: Endoscopy;  Laterality: N/A;  . HIP FRACTURE SURGERY Left 01/25/2012  . LAPAROTOMY N/A 04/15/2017   Procedure: EXPLORATORY LAPAROTOMY;  Surgeon: Clayburn Pert, MD;  Location: [facility];  Service: General;  Laterality: N/A;  . NECK SURGERY  12/2011  . WRIST FRACTURE SURGERY      Current Outpatient Rx  . Order #: [id number] Class: Historical Med  . Order #: [id number] Class: Historical Med  . Order #: [id number] Class: Normal  . Order #:  [id number] Class: Normal  . Order #: [id number] Class: Historical Med  . Order #: [id number] Class: Normal  . Order #: [id number] Class: Historical Med  . Order #: [id number] Class: Normal  . Order #: [id number] Class: Normal  . Order #: [id number] Class: Normal  . Order #: [id number] Class: Historical Med  . Order #: [id number] Class: Normal  . Order #: [id number] Class: Print    Allergies Sinus formula [cholestatin] and Iodine  Family History  Problem Relation Age of Onset  . Diabetes Mother        type 2  . Coronary artery disease Mother   . Deep vein thrombosis Mother   . Cancer Father   . Asthma Brother   . Diabetes Brother        type 2    Social History Social History  Substance Use Topics  . Smoking status: Current Every Day Smoker    Packs/day: 0.50    Years: 55.00    Types: Cigarettes  . Smokeless tobacco: Never Used     Comment: started age 38 1/2 to 1 ppd  . Alcohol use 0.0 oz/week     Comment: occasionally drinks beer    Review of Systems Constitutional: No fever/chills.No lightheadedness or syncope. Eyes: No visual changes. ENT: No sore throat. No congestion or rhinorrhea. Cardiovascular: Denies chest pain. Denies palpitations. Respiratory: Denies shortness of breath.  No cough. Gastrointestinal: Positive right abdominal mass with pain in the right lower quadrant. No nausea, no vomiting.  No diarrhea.  No constipation. Genitourinary: Negative for dysuria. Musculoskeletal: Negative for back pain. Skin: Negative for rash. Neurological: Negative for headaches. No focal numbness, tingling or weakness.     ____________________________________________   PHYSICAL EXAM:  VITAL SIGNS: ED Triage Vitals  Enc Vitals Group     BP 04/21/17 1220 129/75     Pulse Rate 04/21/17 1220 95     Resp 04/21/17 1220 16     Temp 04/21/17 1220 98.7 F (37.1 C)     Temp Source 04/21/17 1220 Oral     SpO2 04/21/17 1220 94 %     Weight 04/21/17 1220 148 lb (67.1 kg)     Height  04/21/17 1220 5\' 4"  (1.626 m)     Head Circumference --      Peak Flow --      Pain Score 04/21/17 1219 5     Pain Loc --      Pain Edu? --      Excl. in [place]? --     Constitutional: Alert and oriented. Chronically ill appearing and in no acute distress. Answers questions appropriately. Eyes: Conjunctivae are normal.  EOMI. No scleral icterus. Head: Atraumatic. Nose: No congestion/rhinnorhea. Mouth/Throat: Mucous membranes are moist.  Neck: No stridor.  Supple.  No JVD. No meningismus. Cardiovascular: Normal rate, regular rhythm. No murmurs, rubs or gallops.  Respiratory: Normal respiratory effort.  No accessory muscle use or retractions. Lungs CTAB.  No wheezes, rales or ronchi. Gastrointestinal: The patient has mild diffuse distention with tenderness to palpation diffusely but more prominently in the right lower quadrant. The patient has an ostomy bag in the right lower quadrant that is filled with liquid brown and reddish stool. In the midline, she has a 14  inch incision that is stapled without any surrounding erythema, fluctuance or discharge. In the left lower quadrant, the patient has a 1.5 x 1" circular fistula that is malodorous, has a central black eschar, and surrounding purulent discharge.  Musculoskeletal: No LE edema.  Neurologic:  A&Ox3.  Speech is clear.  Face and smile are symmetric.  EOMI.  Moves all extremities well. Skin:  Skin is warm, dry and intact. No rash noted. Psychiatric: Mood and affect are normal. Speech and behavior are normal.  Normal judgement.  ____________________________________________   LABS (all labs ordered are listed, but only abnormal results are displayed)  Labs Reviewed  CBC - Abnormal; Notable for the following:       Result Value   WBC 18.5 (*)    Hemoglobin 11.8 (*)    RDW 19.8 (*)    All other components within normal limits  COMPREHENSIVE METABOLIC PANEL - Abnormal; Notable for the following:    Sodium 134 (*)    Chloride 99 (*)     Calcium 8.2 (*)    Total Protein 5.6 (*)    Albumin 2.2 (*)    All other components within normal limits  URINALYSIS, COMPLETE (UACMP) WITH MICROSCOPIC - Abnormal; Notable for the following:    Color, Urine STRAW (*)    APPearance CLEAR (*)    Specific Gravity, Urine 1.004 (*)    All other components within normal limits  LIPASE, BLOOD   ____________________________________________  EKG  Not indicated. ____________________________________________  RADIOLOGY  No results found.  ____________________________________________   PROCEDURES  Procedure(s) performed: None  Procedures  Critical Care performed: No ____________________________________________   INITIAL IMPRESSION / ASSESSMENT AND PLAN / ED COURSE  Pertinent labs & imaging results that were available during my care of the patient were reviewed by me and considered in my medical decision making (see chart for details).  71 y.o. female with recent major abdominal surgery presenting with pain, distention on examination, and concerns for possible infection. Overall, the patient is hemodynamically stable. We get a CT scan of the abdomen and talked to her surgeons for reevaluation. I will treat the patient's pain, and her final disposition will depend on the outcome of her imaging as well as her clinical course in the ED.  ----------------------------------------- 3:13 PM on 04/21/2017 -----------------------------------------  The patient does have an elevation in her white blood cell count. Her urinalysis is negative for infection. I'm awaiting the results of her CT scan. At this time, the patient has been signed out to Dr. Archie Balboa, who will follow-up on the patient's results for final disposition. The general surgeons are aware that the patient is here and that she is undergoing CT scan and will be consulted for final disposition as well.  ____________________________________________  FINAL CLINICAL IMPRESSION(S) /  ED DIAGNOSES  Final diagnoses:  Right lower quadrant pain  Right lower quadrant abdominal pain         NEW MEDICATIONS STARTED DURING THIS VISIT:  New Prescriptions   No medications on file      Eula Listen, MD 04/21/17 1515

## 2017-04-21 NOTE — ED Notes (Signed)
Colostomy emptied for pt

## 2017-04-21 NOTE — Discharge Instructions (Signed)
Please seek medical attention for any high fevers, chest pain, shortness of breath, change in behavior, persistent vomiting, bloody stool or any other new or concerning symptoms.  

## 2017-04-23 ENCOUNTER — Ambulatory Visit (INDEPENDENT_AMBULATORY_CARE_PROVIDER_SITE_OTHER): Payer: Medicare HMO | Admitting: General Surgery

## 2017-04-23 VITALS — BP 137/85 | HR 84 | Temp 98.2°F | Ht 60.0 in | Wt 153.2 lb

## 2017-04-23 DIAGNOSIS — Z4889 Encounter for other specified surgical aftercare: Secondary | ICD-10-CM

## 2017-04-23 NOTE — Patient Instructions (Addendum)
Please use Telfa dressing over the stoma. Please place gauze dressing over the midline excision and change dressing. We have removed the staples. We have placed steri strips over the area and these will begin to fall off in 7-10 days. You may shower and pat dry the area.  Please see your follow up appointment listed below.

## 2017-04-23 NOTE — Progress Notes (Signed)
Outpatient Surgical Follow Up  04/23/2017  Kimberly [name] is an 71 y.o. female.   Chief Complaint  Patient presents with  . New Patient (Initial Visit)    Exploratory Laparotomy    HPI: 71 year old female returns to clinic now 1 week status post exploratory laparotomy with transverse colectomy with end colostomy and mucous fistula formation for obstructing colon lesion. Was seen in the ER Monday and started on antibiotics for cellulitis lateral to her right-sided end colostomy and to the midsection of her midline incision. Patient reports that her pain is much improved and the redness is decreased as well. She is eating well and having good ostomy function. Her pain is well controlled with hydrocodone that she was on at a baseline prior to surgery. She denies any fevers, chills, nausea, vomiting, chest pain, shortness breath, diarrhea, constipation.  Past Medical History:  Diagnosis Date  . Breast cancer, left (HCC)    Lumpectomy and rad tx's.  . Chronic back pain   . Chronic knee pain   . Degenerative disc disease, lumbar 04/2013  . Hyperlipidemia   . Neuropathy   . Osteoarthritis    of knee  . Osteoporosis   . Tobacco abuse   . Vitamin D deficiency     Past Surgical History:  Procedure Laterality Date  . APPENDECTOMY  1965  . BREAST EXCISIONAL BIOPSY Left 08/01/2003   lumpectomy rad 11/04-2/28/2005  . CESAREAN SECTION     x3  . COLONOSCOPY W/ POLYPECTOMY    . ESOPHAGOGASTRODUODENOSCOPY (EGD) WITH PROPOFOL N/A 04/04/2017   Procedure: ESOPHAGOGASTRODUODENOSCOPY (EGD) WITH PROPOFOL;  Surgeon: [name], MD;  Location: [facility];  Service: Endoscopy;  Laterality: N/A;  . HIP FRACTURE SURGERY Left 01/25/2012  . LAPAROTOMY N/A 04/15/2017   Procedure: EXPLORATORY LAPAROTOMY;  Surgeon: Clayburn Pert, MD;  Location: [facility];  Service: General;  Laterality: N/A;  . NECK SURGERY  12/2011  . WRIST FRACTURE SURGERY      Family History  Problem Relation Age of Onset  .  Diabetes Mother        type 2  . Coronary artery disease Mother   . Deep vein thrombosis Mother   . Cancer Father   . Asthma Brother   . Diabetes Brother        type 2    Social History:  reports that she has been smoking Cigarettes.  She has a 27.50 pack-year smoking history. She has never used smokeless tobacco. She reports that she drinks alcohol. She reports that she does not use drugs.  Allergies:  Allergies  Allergen Reactions  . Sinus Formula [Cholestatin] Nausea Only  . Iodine Other (See Comments)    Topical iodine she states "if you put it in an open sore it will cause a eating ulcer."    Medications reviewed.    ROS A multipoint review of systems was completed, all pertinent positives and negatives are documented within the history of present illness and remainder are negative   BP 137/85   Pulse 84   Temp 98.2 F (36.8 C) (Oral)   Ht 5' (1.524 m)   Wt 69.5 kg (153 lb 3.2 oz)   BMI 29.92 kg/m   Physical Exam Gen.: No acute distress  chest: Clear to auscultation Heart: Regular rhythm Abdomen: Soft, appropriately tender to palpation in the midline, and nondistended. Midline staples in place with minimal hyperemia just above the bellybutton. In the colostomy the right upper quadrant with sloughing of the most superficial mucosa but with  visible healthy mucosa above the level of the skin. Mucous fistula the left upper quadrant with healthy-appearing mucosa.    Results for orders placed or performed during the hospital encounter of 04/21/17 (from the past 48 hour(s))  CBC     Status: Abnormal   Collection Time: 04/21/17  1:53 PM  Result Value Ref Range   WBC 18.5 (H) 3.6 - 11.0 K/uL   RBC 4.10 3.80 - 5.20 MIL/uL   Hemoglobin 11.8 (L) 12.0 - 16.0 g/dL   HCT 35.8 35.0 - 47.0 %   MCV 87.4 80.0 - 100.0 fL   MCH 28.8 26.0 - 34.0 pg   MCHC 32.9 32.0 - 36.0 g/dL   RDW 19.8 (H) 11.5 - 14.5 %   Platelets 393 150 - 440 K/uL  Comprehensive metabolic panel      Status: Abnormal   Collection Time: 04/21/17  1:53 PM  Result Value Ref Range   Sodium 134 (L) 135 - 145 mmol/L   Potassium 3.9 3.5 - 5.1 mmol/L    Comment: HEMOLYSIS AT THIS LEVEL MAY AFFECT RESULT   Chloride 99 (L) 101 - 111 mmol/L   CO2 26 22 - 32 mmol/L   Glucose, Bld 95 65 - 99 mg/dL   BUN 7 6 - 20 mg/dL   Creatinine, Ser 0.51 0.44 - 1.00 mg/dL   Calcium 8.2 (L) 8.9 - 10.3 mg/dL   Total Protein 5.6 (L) 6.5 - 8.1 g/dL   Albumin 2.2 (L) 3.5 - 5.0 g/dL   AST 20 15 - 41 U/L   ALT 16 14 - 54 U/L   Alkaline Phosphatase 97 38 - 126 U/L   Total Bilirubin 0.6 0.3 - 1.2 mg/dL   GFR calc non Af Amer >60 >60 mL/min   GFR calc Af Amer >60 >60 mL/min    Comment: (NOTE) The eGFR has been calculated using the CKD EPI equation. This calculation has not been validated in all clinical situations. eGFR's persistently <60 mL/min signify possible Chronic Kidney Disease.    Anion gap 9 5 - 15  Lipase, blood     Status: None   Collection Time: 04/21/17  1:53 PM  Result Value Ref Range   Lipase 17 11 - 51 U/L  Urinalysis, Complete w Microscopic     Status: Abnormal   Collection Time: 04/21/17  2:31 PM  Result Value Ref Range   Color, Urine STRAW (A) YELLOW   APPearance CLEAR (A) CLEAR   Specific Gravity, Urine 1.004 (L) 1.005 - 1.030   pH 7.0 5.0 - 8.0   Glucose, UA NEGATIVE NEGATIVE mg/dL   Hgb urine dipstick NEGATIVE NEGATIVE   Bilirubin Urine NEGATIVE NEGATIVE   Ketones, ur NEGATIVE NEGATIVE mg/dL   Protein, ur NEGATIVE NEGATIVE mg/dL   Nitrite NEGATIVE NEGATIVE   Leukocytes, UA NEGATIVE NEGATIVE   RBC / HPF 0-5 0 - 5 RBC/hpf   WBC, UA NONE SEEN 0 - 5 WBC/hpf   Bacteria, UA NONE SEEN NONE SEEN   Squamous Epithelial / LPF NONE SEEN NONE SEEN   Ct Abdomen Pelvis W Contrast  Result Date: 04/21/2017 CLINICAL DATA:  History of colon carcinoma transverse colon with partial colectomy and right lower quadrant colostomy with pain, initial encounter EXAM: CT ABDOMEN AND PELVIS WITH CONTRAST  TECHNIQUE: Multidetector CT imaging of the abdomen and pelvis was performed using the standard protocol following bolus administration of intravenous contrast. CONTRAST:  138m ISOVUE-300 IOPAMIDOL (ISOVUE-300) INJECTION 61% COMPARISON:  04/08/2017 FINDINGS: Lower chest: Minimal right basilar atelectasis is  noted. No sizable effusion is seen. Hepatobiliary: Diffuse fatty infiltration of the liver is seen. The gallbladder is within normal limits. Pancreas: Unremarkable. No pancreatic ductal dilatation or surrounding inflammatory changes. Spleen: Normal in size without focal abnormality. Adrenals/Urinary Tract: Stable adrenal lesions are noted bilaterally. Kidneys are within normal limits. No renal calculi or obstructive changes are noted. The bladder is well distended. A small focus of air is noted within which likely represents recent instrumentation. Clinical correlation is recommended. Stomach/Bowel: Diverticular change of the sigmoid colon is noted. Mucous fistula is noted with the mid descending colon in the left abdomen. Postsurgical changes are noted in the midline. A right lower quadrant ostomy is seen. Small fluid collection to the right of the ostomy is noted which measures approximately 2.3 cm in greatest dimension. A few small air foci are noted as well. These are felt to be related to the recent surgery. No definitive abscess is seen. No obstructive changes are seen. The appendix is not visualized consistent with the operative history Vascular/Lymphatic: Aortic atherosclerosis. Scattered mesenteric lymph nodes are again identified similar to that seen on the prior exam. Reproductive: Uterus and bilateral adnexa are unremarkable. Other: No abdominal wall hernia or abnormality. No abdominopelvic ascites. Musculoskeletal: Postsurgical changes consistent with hip replacement are seen. Degenerative changes of the lumbar spine are noted. No other bony abnormality is seen. IMPRESSION: Postsurgical changes  consistent with the given clinical history. A small amount of fluid in a year is noted surrounding the right lower quadrant ostomy likely felt to be postoperative in nature. No definitive abscess is seen. The patient's palpable abnormality likely corresponds to the small fluid collection. Chronic changes without acute abnormality. Electronically Signed   By: Inez Catalina M.D.   On: 04/21/2017 15:52    Assessment/Plan:  1. Aftercare following surgery 71 year old female status post transverse colectomy for what turned out to be a T4 N1 obstructing colon cancer. Staples removed today in clinic with minimal opening above the umbilicus with simple dressings placed. Steri-Strips in place to the remainder of midline. Discussed using nonstick dressings to the mucous fistula site left upper quadrant. The right upper quadrant colostomy appears to be functioning well and is in the process of sloughing the most outward aspect of the mucosa. Given that she is currently on antibiotics she will return to clinic on Monday to see my partner in clinic for an additional wound check to determine whether not she needs to continue antibiotics or not. She will then follow up with me the following week. Oncology referrals are given placed and she sees him next week as well.     Clayburn Pert, MD FACS General Surgeon  04/23/2017,12:52 PM

## 2017-04-24 DIAGNOSIS — C189 Malignant neoplasm of colon, unspecified: Secondary | ICD-10-CM | POA: Diagnosis not present

## 2017-04-24 DIAGNOSIS — G629 Polyneuropathy, unspecified: Secondary | ICD-10-CM | POA: Diagnosis not present

## 2017-04-24 DIAGNOSIS — M5136 Other intervertebral disc degeneration, lumbar region: Secondary | ICD-10-CM | POA: Diagnosis not present

## 2017-04-24 DIAGNOSIS — Z483 Aftercare following surgery for neoplasm: Secondary | ICD-10-CM | POA: Diagnosis not present

## 2017-04-24 DIAGNOSIS — M179 Osteoarthritis of knee, unspecified: Secondary | ICD-10-CM | POA: Diagnosis not present

## 2017-04-24 DIAGNOSIS — E785 Hyperlipidemia, unspecified: Secondary | ICD-10-CM | POA: Diagnosis not present

## 2017-04-24 DIAGNOSIS — Z433 Encounter for attention to colostomy: Secondary | ICD-10-CM | POA: Diagnosis not present

## 2017-04-24 DIAGNOSIS — G8929 Other chronic pain: Secondary | ICD-10-CM | POA: Diagnosis not present

## 2017-04-24 DIAGNOSIS — F17210 Nicotine dependence, cigarettes, uncomplicated: Secondary | ICD-10-CM | POA: Diagnosis not present

## 2017-04-24 LAB — SURGICAL PATHOLOGY

## 2017-04-25 ENCOUNTER — Encounter: Payer: Self-pay | Admitting: Oncology

## 2017-04-25 ENCOUNTER — Inpatient Hospital Stay: Payer: Medicare HMO | Attending: Oncology | Admitting: Oncology

## 2017-04-25 VITALS — BP 98/65 | HR 89 | Temp 98.3°F | Wt 154.0 lb

## 2017-04-25 DIAGNOSIS — Z923 Personal history of irradiation: Secondary | ICD-10-CM | POA: Diagnosis not present

## 2017-04-25 DIAGNOSIS — G629 Polyneuropathy, unspecified: Secondary | ICD-10-CM | POA: Diagnosis not present

## 2017-04-25 DIAGNOSIS — M5136 Other intervertebral disc degeneration, lumbar region: Secondary | ICD-10-CM | POA: Insufficient documentation

## 2017-04-25 DIAGNOSIS — E559 Vitamin D deficiency, unspecified: Secondary | ICD-10-CM

## 2017-04-25 DIAGNOSIS — G8929 Other chronic pain: Secondary | ICD-10-CM

## 2017-04-25 DIAGNOSIS — Z853 Personal history of malignant neoplasm of breast: Secondary | ICD-10-CM | POA: Insufficient documentation

## 2017-04-25 DIAGNOSIS — Z800 Family history of malignant neoplasm of digestive organs: Secondary | ICD-10-CM | POA: Diagnosis not present

## 2017-04-25 DIAGNOSIS — F17210 Nicotine dependence, cigarettes, uncomplicated: Secondary | ICD-10-CM | POA: Diagnosis not present

## 2017-04-25 DIAGNOSIS — T814XXS Infection following a procedure, sequela: Secondary | ICD-10-CM | POA: Diagnosis not present

## 2017-04-25 DIAGNOSIS — E785 Hyperlipidemia, unspecified: Secondary | ICD-10-CM | POA: Diagnosis not present

## 2017-04-25 DIAGNOSIS — I700 Atherosclerosis of aorta: Secondary | ICD-10-CM | POA: Insufficient documentation

## 2017-04-25 DIAGNOSIS — Z86010 Personal history of colonic polyps: Secondary | ICD-10-CM | POA: Diagnosis not present

## 2017-04-25 DIAGNOSIS — M818 Other osteoporosis without current pathological fracture: Secondary | ICD-10-CM | POA: Insufficient documentation

## 2017-04-25 DIAGNOSIS — Z933 Colostomy status: Secondary | ICD-10-CM | POA: Diagnosis not present

## 2017-04-25 DIAGNOSIS — M1990 Unspecified osteoarthritis, unspecified site: Secondary | ICD-10-CM | POA: Insufficient documentation

## 2017-04-25 DIAGNOSIS — M25569 Pain in unspecified knee: Secondary | ICD-10-CM | POA: Diagnosis not present

## 2017-04-25 DIAGNOSIS — Z79899 Other long term (current) drug therapy: Secondary | ICD-10-CM

## 2017-04-25 DIAGNOSIS — L03818 Cellulitis of other sites: Secondary | ICD-10-CM | POA: Diagnosis not present

## 2017-04-25 DIAGNOSIS — R599 Enlarged lymph nodes, unspecified: Secondary | ICD-10-CM | POA: Diagnosis not present

## 2017-04-25 DIAGNOSIS — C184 Malignant neoplasm of transverse colon: Secondary | ICD-10-CM | POA: Diagnosis not present

## 2017-04-25 NOTE — Progress Notes (Signed)
Patient here today as a new patient  

## 2017-04-26 DIAGNOSIS — C184 Malignant neoplasm of transverse colon: Secondary | ICD-10-CM | POA: Insufficient documentation

## 2017-04-26 DIAGNOSIS — C189 Malignant neoplasm of colon, unspecified: Secondary | ICD-10-CM | POA: Insufficient documentation

## 2017-04-26 NOTE — Progress Notes (Addendum)
START ON PATHWAY REGIMEN - Colorectal     A cycle is every 14 days:     Oxaliplatin      Leucovorin      5-Fluorouracil      5-Fluorouracil   **Always confirm dose/schedule in your pharmacy ordering system**    Patient Characteristics: Colon Adjuvant, Stage III, High Risk (T4 or N2) Current evidence of distant metastases<= No AJCC T Category: T4a AJCC N Category: N1b AJCC M Category: M0 AJCC 8 Stage Grouping: IIIB Intent of Therapy: Curative Intent, Discussed with Patient 

## 2017-04-26 NOTE — Progress Notes (Signed)
[place] [facility] Initial Visit:  Patient Care Team: Birdie Sons, MD as PCP - General (Family Medicine)  CHIEF COMPLAINTS/PURPOSE OF CONSULTATION: I have colon cancer  HISTORY OF PRESENTING ILLNESS: Kimberly [name] 71 y.o. female with PMH listed as below is here for evaluation of colon cancer and management.  Patient reports a remote history of breast cancer s/p lumpectomy and radiation treatments. Patient had been having abdominal pain, weight loss and bloating.  CT scan showed partial obstruction at the level of transverse colon and ileocolic mesenteric adenopathy. She was prepared for a colonoscopy on 04/15/2017. She started to have worsened abdominal pain, nausea vomiting, unable to maintain oral intake and was sent to ED. She was found to have bowel obstruction and underwent  exploratory laparotomy with transverse colectomy, with end colostomy and mucous fistula formation for obstructing colon lesion. Differential diagnosis prior to surgery was metastatic breast cancer in colon vs colon cancer. The patient did have a colonoscopy 2 years ago without the mass being present at that time but did have 2 small polyps in the transverse colon that were removed.  Pathology showed T4aN1 moderate differentiated adenocarcinoma. Today she is about 10 days post surgery. She was seen in the ER earlier this week due to wound infection and  Was started on antibiotics for cellulitis lateral to her right-sided end colostomy and to the midsection of her midline incision. Patient reports that her pain is much improved and the redness is decreased as well. She is eating well and having good ostomy function.    Review of Systems  Constitutional: Negative.   HENT:  Negative.   Eyes: Negative.   Respiratory: Negative.   Cardiovascular: Negative.   Gastrointestinal: Negative.   Endocrine: Negative.   Genitourinary: Negative.    Musculoskeletal: Negative.   Skin: Positive for wound.   Neurological: Negative.   Hematological: Negative.   Psychiatric/Behavioral: Negative.     MEDICAL HISTORY: Past Medical History:  Diagnosis Date  . Breast cancer ([place])   . Breast cancer, left (HCC)    Lumpectomy and rad tx's.  . Chronic back pain   . Chronic knee pain   . Colon cancer ([place])   . Degenerative disc disease, lumbar 04/2013  . Hyperlipidemia   . Neuropathy   . Osteoarthritis    of knee  . Osteoporosis   . Tobacco abuse   . Vitamin D deficiency     SURGICAL HISTORY: Past Surgical History:  Procedure Laterality Date  . APPENDECTOMY  1965  . BREAST EXCISIONAL BIOPSY Left 08/01/2003   lumpectomy rad 11/04-2/28/2005  . CESAREAN SECTION     x3  . COLONOSCOPY W/ POLYPECTOMY    . ESOPHAGOGASTRODUODENOSCOPY (EGD) WITH PROPOFOL N/A 04/04/2017   Procedure: ESOPHAGOGASTRODUODENOSCOPY (EGD) WITH PROPOFOL;  Surgeon: [name], MD;  Location: [facility];  Service: Endoscopy;  Laterality: N/A;  . HIP FRACTURE SURGERY Left 01/25/2012  . LAPAROTOMY N/A 04/15/2017   Procedure: EXPLORATORY LAPAROTOMY;  Surgeon: Clayburn Pert, MD;  Location: [facility];  Service: General;  Laterality: N/A;  . NECK SURGERY  12/2011  . WRIST FRACTURE SURGERY      SOCIAL HISTORY: Social History   Social History  . Marital status: Divorced    Spouse name: N/A  . Number of children: N/A  . Years of education: N/A   Occupational History  . Not on file.   Social History Main Topics  . Smoking status: Current Every Day Smoker    Packs/day: 0.50  Years: 55.00    Types: Cigarettes  . Smokeless tobacco: Never Used     Comment: started age 11 1/2 to 1 ppd  . Alcohol use 0.0 oz/week     Comment: occasionally drinks beer  . Drug use: No  . Sexual activity: No   Other Topics Concern  . Not on file   Social History Narrative  . No narrative on file    FAMILY HISTORY Family History  Problem Relation Age of Onset  . Diabetes Mother        type 2  . Coronary artery  disease Mother   . Deep vein thrombosis Mother   . Colon cancer Father   . Asthma Brother   . Diabetes Brother        type 2    ALLERGIES:  is allergic to sinus formula [cholestatin] and iodine.  MEDICATIONS:  Current Outpatient Prescriptions  Medication Sig Dispense Refill  . amoxicillin-clavulanate (AUGMENTIN) 875-125 MG tablet Take 1 tablet by mouth 2 (two) times daily. 14 tablet 0  . budesonide-formoterol (SYMBICORT) 80-4.5 MCG/ACT inhaler Inhale 2 puffs into the lungs 2 (two) times daily.    . Calcium Citrate-Vitamin D (CITRACAL/VITAMIN D PO) Take by mouth.    . feeding supplement (BOOST / RESOURCE BREEZE) LIQD Take 1 Container by mouth 3 (three) times daily between meals. 30 Container 0  . HYDROcodone-acetaminophen (NORCO) 7.5-325 MG tablet Take 1-2 tablets by mouth every 6 (six) hours as needed for moderate pain. 120 tablet 0  . meloxicam (MOBIC) 15 MG tablet TAKE 1 TABLET EVERY DAY AS NEEDED 90 tablet 3  . nicotine (NICODERM CQ - DOSED IN MG/24 HOURS) 21 mg/24hr patch Place 1 patch (21 mg total) onto the skin daily. 28 patch 0  . omeprazole (PRILOSEC) 20 MG capsule TAKE 1 CAPSULE EVERY DAY 90 capsule 4  . pravastatin (PRAVACHOL) 40 MG tablet TAKE 1 TABLET (40 MG TOTAL) BY MOUTH DAILY. 90 tablet 4  . Probiotic Product (PROBIOTIC DAILY PO) Take by mouth.    . Vitamin D, Ergocalciferol, (DRISDOL) 50000 units CAPS capsule Take 1 capsule (50,000 Units total) by mouth every 7 (seven) days. 12 capsule 0   No current facility-administered medications for this visit.     PHYSICAL EXAMINATION:  ECOG PERFORMANCE STATUS: 1 - Symptomatic but completely ambulatory   Vitals:   04/25/17 1351  BP: 98/65  Pulse: 89  Temp: 98.3 F (36.8 C)    Filed Weights   04/25/17 1351  Weight: 154 lb (69.9 kg)     Physical Exam GENERAL: No distress, well nourished.  SKIN:  No rashes or significant lesions  HEAD: Normocephalic, No masses, lesions, tenderness or abnormalities  EYES:  Conjunctiva are pink, non icteric ENT: External ears normal ,lips , buccal mucosa, and tongue normal and mucous membranes are moist  LYMPH: No palpable cervical and axillary lymphadenopathy  LUNGS: Clear to auscultation, no crackles or wheezes HEART: Regular rate & rhythm, no murmurs, no gallops, S1 normal and S2 normal  ABDOMEN: Abdomen soft, normal bowel sounds, I did not appreciate any  masses or organomegaly. + mid line surgical scar covered with dressing, + colostomy.  MUSCULOSKELETAL: No CVA tenderness and no tenderness on percussion of the back or rib cage.  EXTREMITIES: No edema, no skin discoloration or tenderness NEURO: Alert & oriented, no focal motor/sensory deficits.   LABORATORY DATA: I have personally reviewed the data as listed: Results for CHRISTI, WIRICK (MRN [id number]) as of 04/26/2017 09:20  Ref. Range 04/19/2017 05:33  04/21/2017 13:53  WBC Latest Ref Range: 3.6 - 11.0 K/uL 9.7 18.5 (H)  RBC Latest Ref Range: 3.80 - 5.20 MIL/uL 3.52 (L) 4.10  Hemoglobin Latest Ref Range: 12.0 - 16.0 g/dL 10.5 (L) 11.8 (L)  HCT Latest Ref Range: 35.0 - 47.0 % 31.2 (L) 35.8  MCV Latest Ref Range: 80.0 - 100.0 fL 88.5 87.4  MCH Latest Ref Range: 26.0 - 34.0 pg 29.9 28.8  MCHC Latest Ref Range: 32.0 - 36.0 g/dL 33.8 32.9  RDW Latest Ref Range: 11.5 - 14.5 % 19.4 (H) 19.8 (H)  Platelets Latest Ref Range: 150 - 440 K/uL 309 393  Neutrophils Latest Units: % 70   Lymphocytes Latest Units: % 17   Monocytes Relative Latest Units: % 12   Eosinophil Latest Units: % 1   Basophil Latest Units: % 0   NEUT# Latest Ref Range: 1.4 - 6.5 K/uL 6.8 (H)   Lymphocyte # Latest Ref Range: 1.0 - 3.6 K/uL 1.7   Monocyte # Latest Ref Range: 0.2 - 0.9 K/uL 1.2 (H)   Eosinophils Absolute Latest Ref Range: 0 - 0.7 K/uL 0.1   Basophils Absolute Latest Ref Range: 0 - 0.1 K/uL 0.0    Results for ENNIFER, HARSTON (MRN [id number]) as of 04/26/2017 09:20  Ref. Range 04/21/2017 13:53  Sodium Latest Ref Range: 135 - 145  mmol/L 134 (L)  Potassium Latest Ref Range: 3.5 - 5.1 mmol/L 3.9  Chloride Latest Ref Range: 101 - 111 mmol/L 99 (L)  CO2 Latest Ref Range: 22 - 32 mmol/L 26  Glucose Latest Ref Range: 65 - 99 mg/dL 95  BUN Latest Ref Range: 6 - 20 mg/dL 7  Creatinine Latest Ref Range: 0.44 - 1.00 mg/dL 0.51  Calcium Latest Ref Range: 8.9 - 10.3 mg/dL 8.2 (L)  Anion gap Latest Ref Range: 5 - 15  9  Alkaline Phosphatase Latest Ref Range: 38 - 126 U/L 97  Albumin Latest Ref Range: 3.5 - 5.0 g/dL 2.2 (L)  Lipase Latest Ref Range: 11 - 51 U/L 17  AST Latest Ref Range: 15 - 41 U/L 20  ALT Latest Ref Range: 14 - 54 U/L 16  Total Protein Latest Ref Range: 6.5 - 8.1 g/dL 5.6 (L)  Total Bilirubin Latest Ref Range: 0.3 - 1.2 mg/dL 0.6  GFR, Est African American Latest Ref Range: >60 mL/min >60  GFR, Est Non African American Latest Ref Range: >60 mL/min >60    RADIOGRAPHIC STUDIES: I have personally reviewed the radiological images as listed and agree with the findings in the report 04/08/2017 CT abdomen pelvis w contrast IMPRESSION: 1. Partial obstruction at the level of the transverse colon, likely secondary to carcinoma. 2. Ileocolic mesenteric adenopathy, suspicious for nodal metastasis. 3.  Aortic Atherosclerosis (ICD10-I70.0). 4. Bilateral adrenal adenomas. 04/21/2017 CT abdomen pelvis with contrast. IMPRESSION: Postsurgical changes consistent with the given clinical history. A small amount of fluid in a year is noted surrounding the right lower quadrant ostomy likely felt to be postoperative in nature. No definitive abscess is seen. The patient's palpable abnormality likely corresponds to the small fluid collection.  PATHOLOGY DIAGNOSIS:  A. COLON MASS, TRANSVERSE; TRANSVERSE COLECTOMY:  - INVASIVE ADENOCARCINOMA, MODERATELY DIFFERENTIATED.  - THREE OF SIXTEEN LYMPH NODES INVOLVED BY METASTASIS (3/16).  - LYMPHOVASCULAR AND PERINEURAL INVASION PRESENT.  - SEE SUMMARY BELOW.  Surgical Pathology  Cancer Case Summary  COLON AND RECTUM:  Procedure: transverse colectomy  Tumor Site:  transverse colon  Tumor Size: Greatest dimension: 2.3 cm  Macroscopic Tumor  Perforation: not specified  Histologic Type: adenocarcinoma  Histologic Grade: G2, moderately differentiated  Tumor Extension: tumor invades the visceral peritoneum  Margins: all margins are uninvolved by invasive carcinoma, high-grade dysplasia, intramucosal adenocarcinoma, and adenoma  Treatment Effect: no known presurgical therapy  Lymphovascular Invasion: present  Perineural Invasion: present  Tumor Deposits: not identified  Regional Lymph Nodes: # examined: 16  # involved: 3  Pathologic Stage Classification (pTNM, AJCC 8th Edition): pT4a pN1b  Note: Microsatellite instability testing is obtained   ASSESSMENT/PLAN Cancer Staging Malignant neoplasm of transverse colon [facility]) Staging form: Colon and Rectum, AJCC 8th Edition - Clinical stage from 04/15/2017: Stage IIIB (cT4a, cN1b, cM0) - Signed by Earlie Server, MD on 04/26/2017   Discussed with patient and her daughter in details about the colon cancer diagnosis, extent/stage of the disease and prognosis. I recommend adjuvant chemotherapy with FOLFOX x 12 treatments. Considering patient's age, and performance status of 1, I plan to start with dose reduction and use 80%. Goal of care is curative intent,  I explained to the patient the risks and benefits of chemotherapy including all but not limited to nausea, vomiting, hair loss, hand and foot syndrome, diarrhea, low blood counts and risk of infection and hospitalization. Risk of neuropathy is associated with Oxaliplatin. Patient understands and agrees to proceed as planned. # Chemotherapy education;  # refer to Dr.Wooden for port placement. Plan to start chemotherapy approximately 6 weeks post surgery, which will be end of August, assuming patient's wound is completely healed.   # As she has T4 disease, after she finishes  systemic chemotherapy, I think she would benefit from radiation to reduce local recurrence. # Baseline CBC, CMP, CEA before chemotherapy.   Orders Placed This Encounter  Procedures  . CBC with Differential/Platelet    Standing Status:   Future    Standing Expiration Date:   04/25/2018  . Comprehensive metabolic panel    Standing Status:   Future    Standing Expiration Date:   04/25/2018  . CEA    Standing Status:   Future    Standing Expiration Date:   04/25/2018    All questions were answered. The patient knows to call the clinic with any problems, questions or concerns.  Dr. Earlie Server, MD, PhD [facility] Pager- [phone] 04/26/2017

## 2017-04-28 ENCOUNTER — Ambulatory Visit (INDEPENDENT_AMBULATORY_CARE_PROVIDER_SITE_OTHER): Payer: Medicare HMO | Admitting: General Surgery

## 2017-04-28 ENCOUNTER — Encounter: Payer: Self-pay | Admitting: Surgery

## 2017-04-28 VITALS — BP 111/72 | HR 112 | Temp 97.2°F | Wt 155.0 lb

## 2017-04-28 DIAGNOSIS — Z4889 Encounter for other specified surgical aftercare: Secondary | ICD-10-CM

## 2017-04-28 MED ORDER — SIMETHICONE 80 MG PO TABS: 1.0000 | ORAL_TABLET | Freq: Three times a day (TID) | ORAL | 0 refills | Status: DC

## 2017-04-28 MED ORDER — AMOXICILLIN-POT CLAVULANATE 875-125 MG PO TABS: 1.0000 | ORAL_TABLET | Freq: Two times a day (BID) | ORAL | 0 refills | Status: DC

## 2017-04-28 NOTE — Patient Instructions (Signed)
Please call us in case you have any questions or concerns.  Please restart taking the Amoxicillin (called in at your pharmacy).  A prescription for bloating (Simethicone) was also called in to your pharmacy.

## 2017-04-28 NOTE — Progress Notes (Signed)
Outpatient Surgical Follow Up  04/28/2017  Kimberly [name] is an 71 y.o. female.   Chief Complaint  Patient presents with  . Routine Post Op    Exploratory Laparotomy with Colostomy and Mucous Fistula (04/15/17)- Dr. Adonis Huguenin    HPI: 71 year old female returns to clinic for a wound check. She reports doing well since her last visit. Still has occasional pains to the right upper quadrant near her ostomy site. She has not had any additional leaking from her ostomy to her midline since her last visit. The dark portions of her ostomy have eroded off and her remaining ostomy is pink and functioning well at the level of the skin. Denies any fevers, chills, nausea, vomiting, chest pain , shortness of breath.  Past Medical History:  Diagnosis Date  . Breast cancer ([place])   . Breast cancer, left ([facility])    Lumpectomy and rad tx's.  . Chronic back pain   . Chronic knee pain   . Colon cancer ([place])   . Degenerative disc disease, lumbar 04/2013  . Hyperlipidemia   . Neuropathy   . Osteoarthritis    of knee  . Osteoporosis   . Tobacco abuse   . Vitamin D deficiency     Past Surgical History:  Procedure Laterality Date  . APPENDECTOMY  1965  . BREAST EXCISIONAL BIOPSY Left 08/01/2003   lumpectomy rad 11/04-2/28/2005  . CESAREAN SECTION     x3  . COLONOSCOPY W/ POLYPECTOMY    . ESOPHAGOGASTRODUODENOSCOPY (EGD) WITH PROPOFOL N/A 04/04/2017   Procedure: ESOPHAGOGASTRODUODENOSCOPY (EGD) WITH PROPOFOL;  Surgeon: [name], MD;  Location: [place];  Service: Endoscopy;  Laterality: N/A;  . HIP FRACTURE SURGERY Left 01/25/2012  . LAPAROTOMY N/A 04/15/2017   Procedure: EXPLORATORY LAPAROTOMY;  Surgeon: Clayburn Pert, MD;  Location: [facility];  Service: General;  Laterality: N/A;  . NECK SURGERY  12/2011  . WRIST FRACTURE SURGERY      Family History  Problem Relation Age of Onset  . Diabetes Mother        type 2  . Coronary artery disease Mother   . Deep vein thrombosis Mother   .  Colon cancer Father   . Asthma Brother   . Diabetes Brother        type 2    Social History:  reports that she has been smoking Cigarettes.  She has a 27.50 pack-year smoking history. She has never used smokeless tobacco. She reports that she drinks alcohol. She reports that she does not use drugs.  Allergies:  Allergies  Allergen Reactions  . Sinus Formula [Cholestatin] Nausea Only  . Iodine Other (See Comments)    Topical iodine she states "if you put it in an open sore it will cause a eating ulcer."    Medications reviewed.    ROS A multipoint ROS was completed and is documented in the HPI, the remainder of findings are negative.  BP 111/72   Pulse (!) 112   Temp (!) 97.2 F (36.2 C) (Oral)   Wt 70.3 kg (155 lb)   BMI 30.27 kg/m   Physical Exam Gen: NAD Resp: CTA CV: RRR ABD: Soft, minimally tender at her incision sites. Healthy appearing Ostomy and mucous fistula. Midline with minimal hyperemia and 31mm opening above her umbilicus. Probed fascia is intact.    No results found for this or any previous visit (from the past 48 hour(s)). No results found.  Assessment/Plan:  1. Aftercare following surgery 71 year female s/p  transverse colectomy. Improving. Discussed continuing antibiotics. Discussed signs of infection and for her to return to clinic immediately should they occur. Otherwise she will return in 1 week for an additional wound check. Discussed that her wound will need to be healed prior to having a port placed for her chemo.     Clayburn Pert, MD FACS General Surgeon  04/28/2017,7:46 PM

## 2017-05-01 DIAGNOSIS — Z433 Encounter for attention to colostomy: Secondary | ICD-10-CM | POA: Diagnosis not present

## 2017-05-01 DIAGNOSIS — C189 Malignant neoplasm of colon, unspecified: Secondary | ICD-10-CM | POA: Diagnosis not present

## 2017-05-01 DIAGNOSIS — M5136 Other intervertebral disc degeneration, lumbar region: Secondary | ICD-10-CM | POA: Diagnosis not present

## 2017-05-01 DIAGNOSIS — E785 Hyperlipidemia, unspecified: Secondary | ICD-10-CM | POA: Diagnosis not present

## 2017-05-01 DIAGNOSIS — G8929 Other chronic pain: Secondary | ICD-10-CM | POA: Diagnosis not present

## 2017-05-01 DIAGNOSIS — M179 Osteoarthritis of knee, unspecified: Secondary | ICD-10-CM | POA: Diagnosis not present

## 2017-05-01 DIAGNOSIS — F17210 Nicotine dependence, cigarettes, uncomplicated: Secondary | ICD-10-CM | POA: Diagnosis not present

## 2017-05-01 DIAGNOSIS — G629 Polyneuropathy, unspecified: Secondary | ICD-10-CM | POA: Diagnosis not present

## 2017-05-01 DIAGNOSIS — Z483 Aftercare following surgery for neoplasm: Secondary | ICD-10-CM | POA: Diagnosis not present

## 2017-05-03 ENCOUNTER — Emergency Department
Admission: EM | Admit: 2017-05-03 | Discharge: 2017-05-03 | Disposition: A | Discharge status: Home / Self Care | Payer: Medicare HMO | Attending: Emergency Medicine | Admitting: Emergency Medicine

## 2017-05-03 ENCOUNTER — Encounter: Payer: Self-pay | Admitting: Emergency Medicine

## 2017-05-03 ENCOUNTER — Emergency Department: Payer: Medicare HMO

## 2017-05-03 DIAGNOSIS — Z79899 Other long term (current) drug therapy: Secondary | ICD-10-CM | POA: Diagnosis not present

## 2017-05-03 DIAGNOSIS — I82452 Acute embolism and thrombosis of left peroneal vein: Secondary | ICD-10-CM | POA: Insufficient documentation

## 2017-05-03 DIAGNOSIS — I82492 Acute embolism and thrombosis of other specified deep vein of left lower extremity: Secondary | ICD-10-CM

## 2017-05-03 DIAGNOSIS — F17210 Nicotine dependence, cigarettes, uncomplicated: Secondary | ICD-10-CM | POA: Insufficient documentation

## 2017-05-03 DIAGNOSIS — R109 Unspecified abdominal pain: Secondary | ICD-10-CM | POA: Diagnosis not present

## 2017-05-03 DIAGNOSIS — N390 Urinary tract infection, site not specified: Secondary | ICD-10-CM | POA: Diagnosis not present

## 2017-05-03 DIAGNOSIS — I824Z2 Acute embolism and thrombosis of unspecified deep veins of left distal lower extremity: Secondary | ICD-10-CM | POA: Diagnosis not present

## 2017-05-03 DIAGNOSIS — R1030 Lower abdominal pain, unspecified: Secondary | ICD-10-CM | POA: Diagnosis not present

## 2017-05-03 DIAGNOSIS — R1031 Right lower quadrant pain: Secondary | ICD-10-CM | POA: Diagnosis present

## 2017-05-03 LAB — COMPREHENSIVE METABOLIC PANEL
ALT: 10 U/L — ABNORMAL LOW (ref 14–54)
ANION GAP: 7 (ref 5–15)
AST: 14 U/L — ABNORMAL LOW (ref 15–41)
Albumin: 1.6 g/dL — ABNORMAL LOW (ref 3.5–5.0)
Alkaline Phosphatase: 73 U/L (ref 38–126)
BILIRUBIN TOTAL: 0.5 mg/dL (ref 0.3–1.2)
BUN: 8 mg/dL (ref 6–20)
CO2: 25 mmol/L (ref 22–32)
Calcium: 7.4 mg/dL — ABNORMAL LOW (ref 8.9–10.3)
Chloride: 103 mmol/L (ref 101–111)
Creatinine, Ser: 0.5 mg/dL (ref 0.44–1.00)
Glucose, Bld: 105 mg/dL — ABNORMAL HIGH (ref 65–99)
POTASSIUM: 3.3 mmol/L — AB (ref 3.5–5.1)
Sodium: 135 mmol/L (ref 135–145)
TOTAL PROTEIN: 5.3 g/dL — AB (ref 6.5–8.1)

## 2017-05-03 LAB — CBC
HEMATOCRIT: 30.7 % — AB (ref 35.0–47.0)
Hemoglobin: 10.3 g/dL — ABNORMAL LOW (ref 12.0–16.0)
MCH: 28.5 pg (ref 26.0–34.0)
MCHC: 33.5 g/dL (ref 32.0–36.0)
MCV: 85.1 fL (ref 80.0–100.0)
Platelets: 891 10*3/uL — ABNORMAL HIGH (ref 150–440)
RBC: 3.61 MIL/uL — ABNORMAL LOW (ref 3.80–5.20)
RDW: 18.6 % — AB (ref 11.5–14.5)
WBC: 12.7 10*3/uL — ABNORMAL HIGH (ref 3.6–11.0)

## 2017-05-03 LAB — URINALYSIS, COMPLETE (UACMP) WITH MICROSCOPIC
BACTERIA UA: NONE SEEN
BILIRUBIN URINE: NEGATIVE
Glucose, UA: NEGATIVE mg/dL
Hgb urine dipstick: NEGATIVE
Ketones, ur: NEGATIVE mg/dL
Leukocytes, UA: NEGATIVE
NITRITE: POSITIVE — AB
Protein, ur: NEGATIVE mg/dL
SPECIFIC GRAVITY, URINE: 1.003 — AB (ref 1.005–1.030)
pH: 6 (ref 5.0–8.0)

## 2017-05-03 LAB — LIPASE, BLOOD: Lipase: 25 U/L (ref 11–51)

## 2017-05-03 MED ORDER — IOPAMIDOL (ISOVUE-300) INJECTION 61%
100.0000 mL | Freq: Once | INTRAVENOUS | Status: AC | PRN
  Administered 2017-05-03: 100 mL via INTRAVENOUS

## 2017-05-03 MED ORDER — CIPROFLOXACIN HCL 500 MG PO TABS: 500.0000 mg | ORAL_TABLET | Freq: Two times a day (BID) | ORAL | 0 refills | Status: AC

## 2017-05-03 MED ORDER — IOPAMIDOL (ISOVUE-300) INJECTION 61%
30.0000 mL | Freq: Once | INTRAVENOUS | Status: AC | PRN
  Administered 2017-05-03: 30 mL via ORAL

## 2017-05-03 NOTE — Discharge Instructions (Addendum)
Please seek medical attention for any high fevers, chest pain, shortness of breath, change in behavior, persistent vomiting, bloody stool or any other new or concerning symptoms.  

## 2017-05-03 NOTE — ED Notes (Signed)
Pt had colon cancer surgery approx 4 weeks ago. Pt has c/o of bi-lateral leg swelling and lower abdominal pain. MD Quale at bedside.

## 2017-05-03 NOTE — ED Notes (Signed)
Pt verbalized understanding of discharge instructions. NAD at this time. 

## 2017-05-03 NOTE — Progress Notes (Signed)
Date of Consultation:  05/03/2017  Requesting Physician:  Delman Kitten, MD  Reason for Consultation:  Dysuria and abdominal pain  History of Present Illness: Kimberly [name] is a 71 y.o. female who presents with abdominal pain and dysuria.  She is recently s/p transverse colectomy with end colostomy and mucous fistula creation with Dr. Adonis Huguenin.  She has been followed by the office with cellulitis around the colostomy and infection of the midline incision.  She is currently on Augmentin and her course was renewed on 7/30 when seen in the office.  She reports that over the past few days she has been having burning sensation and pain in the lower abdomen each time she voids.  She also complains of left lower extremity swelling.  She denies having any nausea or vomiting, and though reports decreased appetite, she is tolerating her po intake well.  She denies feeling more distended, and reports normal stool output from her ostomy, and empties the bag about 3 times per day.  Denies any pain per rectum, though reports some foul smell from the mucous fistula.  Denies any blood in the stool or in the urine.  In the ED, her workup has included laboratory studies and urinalysis, with left lower extremity ultrasound and CT scan of abdomen and pelvis.   Past Medical History: Past Medical History:  Diagnosis Date  . Breast cancer ([place])   . Breast cancer, left ([facility])    Lumpectomy and rad tx's.  . Chronic back pain   . Chronic knee pain   . Colon cancer ([place])   . Degenerative disc disease, lumbar 04/2013  . Hyperlipidemia   . Neuropathy   . Osteoarthritis    of knee  . Osteoporosis   . Tobacco abuse   . Vitamin D deficiency      Past Surgical History: Past Surgical History:  Procedure Laterality Date  . APPENDECTOMY  1965  . BREAST EXCISIONAL BIOPSY Left 08/01/2003   lumpectomy rad 11/04-2/28/2005  . CESAREAN SECTION     x3  . COLONOSCOPY W/ POLYPECTOMY    . ESOPHAGOGASTRODUODENOSCOPY (EGD) WITH  PROPOFOL N/A 04/04/2017   Procedure: ESOPHAGOGASTRODUODENOSCOPY (EGD) WITH PROPOFOL;  Surgeon: [name], MD;  Location: [place];  Service: Endoscopy;  Laterality: N/A;  . HIP FRACTURE SURGERY Left 01/25/2012  . LAPAROTOMY N/A 04/15/2017   Procedure: EXPLORATORY LAPAROTOMY;  Surgeon: Clayburn Pert, MD;  Location: [facility];  Service: General;  Laterality: N/A;  . NECK SURGERY  12/2011  . WRIST FRACTURE SURGERY      Home Medications: Prior to Admission medications   Medication Sig Start Date End Date Taking? Authorizing Provider  amoxicillin-clavulanate (AUGMENTIN) 875-125 MG tablet Take 1 tablet by mouth 2 (two) times daily. 04/28/17 05/05/17 Yes Clayburn Pert, MD  budesonide-formoterol [facility]) 80-4.5 MCG/ACT inhaler Inhale 2 puffs into the lungs 2 (two) times daily.   Yes [provider]  Calcium Citrate-Vitamin D (CITRACAL/VITAMIN D PO) Take by mouth.   Yes [provider]  feeding supplement (BOOST / RESOURCE BREEZE) LIQD Take 1 Container by mouth 3 (three) times daily between meals. 04/19/17  Yes Fritzi Mandes, MD  HYDROcodone-acetaminophen (NORCO) 7.5-325 MG tablet Take 1-2 tablets by mouth every 6 (six) hours as needed for moderate pain. 03/20/17  Yes Birdie Sons, MD  meloxicam (MOBIC) 15 MG tablet TAKE 1 TABLET EVERY DAY AS NEEDED 11/09/16  Yes Birdie Sons, MD  nicotine (NICODERM CQ - DOSED IN MG/24 HOURS) 21 mg/24hr patch Place 1 patch (21 mg  total) onto the skin daily. 04/20/17  Yes Fritzi Mandes, MD  omeprazole (PRILOSEC) 20 MG capsule TAKE 1 CAPSULE EVERY DAY 11/09/16  Yes Birdie Sons, MD  pravastatin (PRAVACHOL) 40 MG tablet TAKE 1 TABLET (40 MG TOTAL) BY MOUTH DAILY. 02/17/17  Yes Birdie Sons, MD  Probiotic Product (PROBIOTIC DAILY PO) Take by mouth.   Yes [provider]  Simethicone 80 MG TABS Take 1 tablet (80 mg total) by mouth 3 (three) times daily. 04/28/17  Yes Clayburn Pert, MD  Vitamin D, Ergocalciferol, (DRISDOL)  50000 units CAPS capsule Take 1 capsule (50,000 Units total) by mouth every 7 (seven) days. 11/10/15  Yes Birdie Sons, MD    Allergies: Allergies  Allergen Reactions  . Sinus Formula [Cholestatin] Nausea Only  . Iodine Other (See Comments)    Topical iodine she states "if you put it in an open sore it will cause a eating ulcer."    Social History:  reports that she has been smoking Cigarettes.  She has a 27.50 pack-year smoking history. She has never used smokeless tobacco. She reports that she drinks alcohol. She reports that she does not use drugs.   Family History: Family History  Problem Relation Age of Onset  . Diabetes Mother        type 2  . Coronary artery disease Mother   . Deep vein thrombosis Mother   . Colon cancer Father   . Asthma Brother   . Diabetes Brother        type 2    Review of Systems: Review of Systems  Constitutional: Negative for chills and fever.  HENT: Negative for hearing loss.   Eyes: Negative for blurred vision.  Respiratory: Negative for shortness of breath.   Cardiovascular: Negative for chest pain.  Gastrointestinal: Positive for abdominal pain. Negative for blood in stool, constipation, diarrhea, nausea and vomiting.  Genitourinary: Positive for dysuria. Negative for urgency.  Musculoskeletal: Negative for myalgias.  Skin: Negative for rash.  Neurological: Negative for dizziness.  All other systems reviewed and are negative.   Physical Exam BP (!) 152/73   Pulse 89   Resp 18   Ht 5' (1.524 m)   Wt 70.3 kg (155 lb)   SpO2 93%   BMI 30.27 kg/m  CONSTITUTIONAL: No acute distress HEENT:  Normocephalic, atraumatic, extraocular motion intact. NECK: Trachea is midline, and there is no jugular venous distension.  RESPIRATORY:  Lungs are clear, and breath sounds are equal bilaterally. Normal respiratory effort without pathologic use of accessory muscles. CARDIOVASCULAR: Heart is regular without murmurs, gallops, or rubs. GI: The  abdomen is soft, nondistended, nontender to palpation.  Right end colostomy pink and viable with soft stool in bag.  Left sided mucous fistula with pink mucosa, with dark brown stool at the edge.  Midline incision healing appropriately, with a small sinus track in the middle of the wound, with no purulent fluid or foul smell.  No significant cellulitis noted. MUSCULOSKELETAL:  Normal muscle strength and tone in all four extremities.  No peripheral edema or cyanosis. SKIN: Skin turgor is normal. There are no pathologic skin lesions.  NEUROLOGIC:  Motor and sensation is grossly normal.  Cranial nerves are grossly intact. PSYCH:  Alert and oriented to person, place and time. Affect is normal.  Laboratory Analysis: Results for orders placed or performed during the hospital encounter of 05/03/17 (from the past 24 hour(s))  CBC     Status: Abnormal   Collection Time: 05/03/17 12:47 PM  Result Value Ref Range   WBC 12.7 (H) 3.6 - 11.0 K/uL   RBC 3.61 (L) 3.80 - 5.20 MIL/uL   Hemoglobin 10.3 (L) 12.0 - 16.0 g/dL   HCT 30.7 (L) 35.0 - 47.0 %   MCV 85.1 80.0 - 100.0 fL   MCH 28.5 26.0 - 34.0 pg   MCHC 33.5 32.0 - 36.0 g/dL   RDW 18.6 (H) 11.5 - 14.5 %   Platelets 891 (H) 150 - 440 K/uL  Comprehensive metabolic panel     Status: Abnormal   Collection Time: 05/03/17 12:47 PM  Result Value Ref Range   Sodium 135 135 - 145 mmol/L   Potassium 3.3 (L) 3.5 - 5.1 mmol/L   Chloride 103 101 - 111 mmol/L   CO2 25 22 - 32 mmol/L   Glucose, Bld 105 (H) 65 - 99 mg/dL   BUN 8 6 - 20 mg/dL   Creatinine, Ser 0.50 0.44 - 1.00 mg/dL   Calcium 7.4 (L) 8.9 - 10.3 mg/dL   Total Protein 5.3 (L) 6.5 - 8.1 g/dL   Albumin 1.6 (L) 3.5 - 5.0 g/dL   AST 14 (L) 15 - 41 U/L   ALT 10 (L) 14 - 54 U/L   Alkaline Phosphatase 73 38 - 126 U/L   Total Bilirubin 0.5 0.3 - 1.2 mg/dL   GFR calc non Af Amer >60 >60 mL/min   GFR calc Af Amer >60 >60 mL/min   Anion gap 7 5 - 15  Lipase, blood     Status: None   Collection Time:  05/03/17 12:47 PM  Result Value Ref Range   Lipase 25 11 - 51 U/L  Urinalysis, Complete w Microscopic     Status: Abnormal   Collection Time: 05/03/17 12:47 PM  Result Value Ref Range   Color, Urine AMBER (A) YELLOW   APPearance CLEAR (A) CLEAR   Specific Gravity, Urine 1.003 (L) 1.005 - 1.030   pH 6.0 5.0 - 8.0   Glucose, UA NEGATIVE NEGATIVE mg/dL   Hgb urine dipstick NEGATIVE NEGATIVE   Bilirubin Urine NEGATIVE NEGATIVE   Ketones, ur NEGATIVE NEGATIVE mg/dL   Protein, ur NEGATIVE NEGATIVE mg/dL   Nitrite POSITIVE (A) NEGATIVE   Leukocytes, UA NEGATIVE NEGATIVE   RBC / HPF 0-5 0 - 5 RBC/hpf   WBC, UA 0-5 0 - 5 WBC/hpf   Bacteria, UA NONE SEEN NONE SEEN   Squamous Epithelial / LPF 0-5 (A) NONE SEEN    Imaging: Ct Abdomen Pelvis W Contrast  Result Date: 05/03/2017 CLINICAL DATA:  Colon cancer surgery 4 weeks ago. Bilateral leg swelling and lower abdominal pain. EXAM: CT ABDOMEN AND PELVIS WITH CONTRAST TECHNIQUE: Multidetector CT imaging of the abdomen and pelvis was performed using the standard protocol following bolus administration of intravenous contrast. CONTRAST:  147mL ISOVUE-300 IOPAMIDOL (ISOVUE-300) INJECTION 61% COMPARISON:  04/21/2017 FINDINGS: Lower chest:  Atelectasis in the right lower lobe. Hepatobiliary: No focal liver abnormality.No evidence of biliary obstruction or stone. Pancreas: Unremarkable. Spleen: Unremarkable. Adrenals/Urinary Tract: Low-density thickening of the bilateral adrenal glands consistent with adenomas reported previously. No hydronephrosis or stone. Unremarkable bladder. Stomach/Bowel: Blind colostomy at the transverse segment. There is in continuity colostomy of the ascending segment. Low-density collection in right lateral of the ostomy is larger than before at 36 mm. Minimal inflammation around this collection to suggest an abscess. The ostomy through the skin appears irregular, but is distant from the mass lesions seen on abdominal CT 04/08/2017.  Would not expect an ostomy metastasis  this quickly. The proximal small bowel is dilated and filled with contrast. Small bowel caliber gradually normalizes. In the right lower quadrant loops containing fecalized material consistent with slow transit. Small volume ascites seen around the liver, right interloop space, and pelvis. Mild smooth peritoneal thickening/ enhancement best seen along the liver. No discrete peritoneal nodule noted. Vascular/Lymphatic: Extensive atherosclerotic plaque on the aorta and its branches. No major branch occlusion or critical stenosis is noted. Prominent small bowel mesenteric lymph nodes, nonspecific in the setting of peritoneal thickening. History of lower extremity swelling. Limited opacification of the pelvic veins with no asymmetric expansion or density. Reproductive:Negative Other: No pneumoperitoneum Musculoskeletal: Degenerative changes in the spine. Previous proximal left femur repair. No acute or aggressive finding. IMPRESSION: 1. Partial small bowel obstruction, likely chronic given degree of proximal small bowel dilatation. No discrete transition point, small bowel gradually normalizes in caliber. Ileal loops show fecalized material from slow transit. Follow-up KUB can follow oral contrast to the colostomy. 2. Fluid density around the the ascending ostomy is mildly increased from prior, 3.6 cm. This could be ascites or postoperative collection. 3. New small volume ascites with peritoneal enhancement implying a degree of complexity. No discrete peritoneal mass to strongly suggest peritoneal metastases. Recommend follow-up. 4. Stable thickened appearance of the ascending colostomy. 5. Stable adrenal nodules. Electronically Signed   By: Monte Fantasia M.D.   On: 05/03/2017 15:11   US Venous Img Lower Unilateral Left  Result Date: 05/03/2017 CLINICAL DATA:  Left leg pain and swelling for 2-3 weeks which has worsened over the past 2 days. EXAM: LEFT LOWER EXTREMITY VENOUS  DOPPLER ULTRASOUND TECHNIQUE: Gray-scale sonography with graded compression, as well as color Doppler and duplex ultrasound were performed to evaluate the lower extremity deep venous systems from the level of the common femoral vein and including the common femoral, femoral, profunda femoral, popliteal and calf veins including the posterior tibial, peroneal and gastrocnemius veins when visible. The superficial great saphenous vein was also interrogated. Spectral Doppler was utilized to evaluate flow at rest and with distal augmentation maneuvers in the common femoral, femoral and popliteal veins. COMPARISON:  None. FINDINGS: Contralateral Common Femoral Vein: Respiratory phasicity is normal and symmetric with the symptomatic side. No evidence of thrombus. Normal compressibility. Common Femoral Vein: No evidence of thrombus. Normal compressibility, respiratory phasicity and response to augmentation. Saphenofemoral Junction: No evidence of thrombus. Normal compressibility and flow on color Doppler imaging. Profunda Femoral Vein: No evidence of thrombus. Normal compressibility and flow on color Doppler imaging. Femoral Vein: No evidence of thrombus. Normal compressibility, respiratory phasicity and response to augmentation. Popliteal Vein: No evidence of thrombus. Normal compressibility, respiratory phasicity and response to augmentation. Calf Veins: Occlusive thrombus is seen in the peroneal vein. Superficial Great Saphenous Vein: No evidence of thrombus. Normal compressibility and flow on color Doppler imaging. Venous Reflux:  None. Other Findings: Subcutaneous edema is seen in the region of concern just superior to the lateral aspect of the left knee. IMPRESSION: Occlusive thrombus is identified in the left peroneal vein. No other thrombus is identified. Nonspecific subcutaneous edema in the region of concern superior to the lateral aspect of the left knee. Electronically Signed   By: Inge Rise M.D.   On:  05/03/2017 14:11    Assessment and Plan: This is a 71 y.o. female who presents with a urinary tract infection and left peroneal occlusive thrombus.  I have independently viewed the patients imaging studies and reviewed her laboratory studies.  Overall, on CT scan,  there is some fluid in the abdomen, with some dilated loops of small bowel.  However, clinically, the patient does not have any GI symptoms and no indication of small bowel obstruction.  Her WBC is mildly elevated, though it is improved from her prior CBC on 7/23, and she does have a UTI on U/A with positive nitrite, despite of no bacteria seen which could be attributed to her current antibiotic course.  Discussed with the patient that she would be treated for her UTI with Ciprofloxacin 7-day course, which should also help cover for her prior cellulitis and wound.  She will take this instead of the Augmentin.  The ED consulted vascular surgery over the phone and recommended a follow-up ultrasound of the left lower extremity in 7-10 days to re-evaluate the thrombus, but currently did not require acute treatment.  Have instructed the patient to keep her left leg elevated.  Regarding the foul smell from the mucous fistula, on CT scan it appears to be old contrast from her pre-surgery CT scan, which likely has hardened.  Recommended that she do an enema per rectum at home to help push it out.  With respect to the possible SBO noted on CT scan, she clinically does not have it and does not require admission.  The patient also wishes to go home.  She will have close follow-up and will be seeing Dr. Adonis Huguenin on 05/05/17.  She will follow up with Vascular Surgery as well for her repeat U/S.  Return precautions and instructions have been given to the patient and she understands this plan.  All of her questions have been answered.   Melvyn Neth, [place]

## 2017-05-03 NOTE — ED Provider Notes (Signed)
-----------------------------------------   4:17 PM on 05/03/2017 -----------------------------------------  Patient evaluated by Dr. Hampton Abbot with surgery. At this point feels wound is healing well. Additionally evaluated for possible partial SBO. At this point given that clinically the patient does not appear to have partial SBO and has not had any change in ostomy output feels it is unlikely to be partial SBO. Does recommend treating patient for UTI, with cipro. Will also give patient vascular follow up for venous thromboses noted on ultrasound.    Nance Pear, MD 05/03/17 [phone]

## 2017-05-03 NOTE — ED Provider Notes (Signed)
[facility] Emergency Department Provider Note   ____________________________________________   First MD Initiated Contact with Patient 05/03/17 1223     (approximate)  I have reviewed the triage vital signs and the nursing notes.   HISTORY  Chief Complaint Lower abdominal pain   HPI Kimberly [name] is a 71 y.o. female reports that she is having moderate pain in the right lower abdomen which she describes as a sharp sensation. Began this morning. Also slight discomfort with urination for 1 day. No nausea or vomiting. No change in ostomy output. No dark stools or blood. Reports her incision seems to be slowly healing across the front of the stomach after being on antibiotics.  Recent colectomy.  Also reports 2 days ago she began noticing that her left lower leg was more swollen than normal. It seems to have improved with elevation overnight, but continued to have some slight swelling in the left ankle. No swelling in the right leg.   Past Medical History:  Diagnosis Date  . Breast cancer ([place])   . Breast cancer, left (HCC)    Lumpectomy and rad tx's.  . Chronic back pain   . Chronic knee pain   . Colon cancer ([place])   . Degenerative disc disease, lumbar 04/2013  . Hyperlipidemia   . Neuropathy   . Osteoarthritis    of knee  . Osteoporosis   . Tobacco abuse   . Vitamin D deficiency     Patient Active Problem List   Diagnosis Date Noted  . Malignant neoplasm of transverse colon ([place]) 04/26/2017  . Cellulitis   . Small bowel obstruction ([place]) 04/16/2017  . Bowel obstruction ([place]) 04/15/2017  . Generalized abdominal pain   . Abdominal pain, generalized   . Loss of weight   . Gastritis without bleeding   . Absence of bladder continence 04/01/2017  . Asthmatic breathing 04/01/2017  . Need for vaccination 04/01/2017  . Abnormal abdominal x-ray 02/11/2017  . Acute bacterial sinusitis 02/11/2017  . OP (osteoporosis) 02/11/2017  . Encounter for  general adult medical examination without abnormal findings 02/11/2017  . Skin lesion 02/11/2017  . Screening breast examination 02/11/2017  . Breast swelling 02/11/2017  . Bronchitis 02/11/2017  . Bursitis of hip 02/11/2017  . Cervical cancer screening 02/11/2017  . Gonalgia 02/11/2017  . Conjunctivitis 02/11/2017  . Narrowing of intervertebral disc space 02/11/2017  . Accumulation of fluid in tissues 02/11/2017  . Family history of colon cancer 02/11/2017  . Foot pain 02/11/2017  . Flu vaccine need 02/11/2017  . Arthralgia of hip 02/11/2017  . Personal history of malignant neoplasm of breast 02/11/2017  . Received influenza vaccination at hospital 02/11/2017  . Eczema intertrigo 02/11/2017  . Neuropathy 02/11/2017  . Osteoarthrosis, unspecified whether generalized or localized, involving lower leg 02/11/2017  . Coronary atherosclerosis 01/29/2017  . Personal history of tobacco use, presenting hazards to health 01/21/2017  . Smoking greater than 30 pack years 01/10/2017  . Vitamin D deficiency 08/21/2016  . Hyperlipidemia 11/10/2015  . Chronic back pain 05/26/2015  . DDD (degenerative disc disease), lumbosacral 05/26/2015  . History of colon polyps 04/27/2007    Past Surgical History:  Procedure Laterality Date  . APPENDECTOMY  1965  . BREAST EXCISIONAL BIOPSY Left 08/01/2003   lumpectomy rad 11/04-2/28/2005  . CESAREAN SECTION     x3  . COLONOSCOPY W/ POLYPECTOMY    . ESOPHAGOGASTRODUODENOSCOPY (EGD) WITH PROPOFOL N/A 04/04/2017   Procedure: ESOPHAGOGASTRODUODENOSCOPY (EGD) WITH PROPOFOL;  Surgeon: [name],  MD;  Location: [place];  Service: Endoscopy;  Laterality: N/A;  . HIP FRACTURE SURGERY Left 01/25/2012  . LAPAROTOMY N/A 04/15/2017   Procedure: EXPLORATORY LAPAROTOMY;  Surgeon: Clayburn Pert, MD;  Location: [facility];  Service: General;  Laterality: N/A;  . NECK SURGERY  12/2011  . WRIST FRACTURE SURGERY      Prior to Admission medications     Medication Sig Start Date End Date Taking? Authorizing Provider  amoxicillin-clavulanate (AUGMENTIN) 875-125 MG tablet Take 1 tablet by mouth 2 (two) times daily. 04/28/17 05/05/17 Yes Clayburn Pert, MD  budesonide-formoterol [facility]) 80-4.5 MCG/ACT inhaler Inhale 2 puffs into the lungs 2 (two) times daily.   Yes [provider]  Calcium Citrate-Vitamin D (CITRACAL/VITAMIN D PO) Take by mouth.   Yes [provider]  feeding supplement (BOOST / RESOURCE BREEZE) LIQD Take 1 Container by mouth 3 (three) times daily between meals. 04/19/17  Yes Fritzi Mandes, MD  HYDROcodone-acetaminophen (NORCO) 7.5-325 MG tablet Take 1-2 tablets by mouth every 6 (six) hours as needed for moderate pain. 03/20/17  Yes Birdie Sons, MD  meloxicam (MOBIC) 15 MG tablet TAKE 1 TABLET EVERY DAY AS NEEDED 11/09/16  Yes Birdie Sons, MD  nicotine (NICODERM CQ - DOSED IN MG/24 HOURS) 21 mg/24hr patch Place 1 patch (21 mg total) onto the skin daily. 04/20/17  Yes Fritzi Mandes, MD  omeprazole (PRILOSEC) 20 MG capsule TAKE 1 CAPSULE EVERY DAY 11/09/16  Yes Birdie Sons, MD  pravastatin (PRAVACHOL) 40 MG tablet TAKE 1 TABLET (40 MG TOTAL) BY MOUTH DAILY. 02/17/17  Yes Birdie Sons, MD  Probiotic Product (PROBIOTIC DAILY PO) Take by mouth.   Yes [provider]  Simethicone 80 MG TABS Take 1 tablet (80 mg total) by mouth 3 (three) times daily. 04/28/17  Yes Clayburn Pert, MD  Vitamin D, Ergocalciferol, (DRISDOL) 50000 units CAPS capsule Take 1 capsule (50,000 Units total) by mouth every 7 (seven) days. 11/10/15  Yes Birdie Sons, MD    Allergies Sinus formula [cholestatin] and Iodine  Family History  Problem Relation Age of Onset  . Diabetes Mother        type 2  . Coronary artery disease Mother   . Deep vein thrombosis Mother   . Colon cancer Father   . Asthma Brother   . Diabetes Brother        type 2    Social History Social History  Substance Use Topics  . Smoking  status: Current Every Day Smoker    Packs/day: 0.50    Years: 55.00    Types: Cigarettes  . Smokeless tobacco: Never Used     Comment: started age 5 1/2 to 1 ppd  . Alcohol use 0.0 oz/week     Comment: occasionally drinks beer    Review of Systems Constitutional: No fever/chills Eyes: No visual changes. ENT: No sore throat. Cardiovascular: Denies chest pain. Respiratory: Denies shortness of breath. Gastrointestinal:  No nausea, no vomiting.  No diarrhea.  No constipation. Genitourinary: See history of present illness Musculoskeletal: Negative for back pain. Skin: Negative for rash. Neurological: Negative for headaches, focal weakness or numbness.    ____________________________________________   PHYSICAL EXAM:  VITAL SIGNS: ED Triage Vitals  Enc Vitals Group     BP 05/03/17 1229 139/71     Pulse Rate 05/03/17 1229 89     Resp 05/03/17 1229 16     Temp --      Temp src --  SpO2 05/03/17 1229 94 %     Weight 05/03/17 1230 155 lb (70.3 kg)     Height 05/03/17 1230 5' (1.524 m)     Head Circumference --      Peak Flow --      Pain Score 05/03/17 1229 7     Pain Loc --      Pain Edu? --      Excl. in [place]? --     Constitutional: Alert and oriented. Well appearing and in no acute distress. Eyes: Conjunctivae are normal. Head: Atraumatic. Nose: No congestion/rhinnorhea. Mouth/Throat: Mucous membranes are moist. Neck: No stridor.   Cardiovascular: Normal rate, regular rhythm. Grossly normal heart sounds.  Good peripheral circulation. Respiratory: Normal respiratory effort.  No retractions. Lungs CTAB. Gastrointestinal: Soft and Mild tenderness across the suprapubic and right lower quadrant without rebound or guarding. No distention. Midline incision has slight area of dehiscence approximately mid incision with minimal yellow drainage noted.Ostomy draining formed brown stool. Musculoskeletal: No lower extremity tenderness moves lower extremities without difficulty.  Distal pulses including dorsalis pedis and capillary refill normal bilateral. The left lower extremity demonstrates 1+ pitting edema, the right lower extremity demonstrates no edema. Neurologic:  Normal speech and language. No gross focal neurologic deficits are appreciated.  Skin:  Skin is warm, dry and intact. No rash noted. Psychiatric: Mood and affect are normal. Speech and behavior are normal.  ____________________________________________   LABS (all labs ordered are listed, but only abnormal results are displayed)  Labs Reviewed  CBC - Abnormal; Notable for the following:       Result Value   WBC 12.7 (*)    RBC 3.61 (*)    Hemoglobin 10.3 (*)    HCT 30.7 (*)    RDW 18.6 (*)    Platelets 891 (*)    All other components within normal limits  COMPREHENSIVE METABOLIC PANEL - Abnormal; Notable for the following:    Potassium 3.3 (*)    Glucose, Bld 105 (*)    Calcium 7.4 (*)    Total Protein 5.3 (*)    Albumin 1.6 (*)    AST 14 (*)    ALT 10 (*)    All other components within normal limits  URINALYSIS, COMPLETE (UACMP) WITH MICROSCOPIC - Abnormal; Notable for the following:    Color, Urine AMBER (*)    APPearance CLEAR (*)    Specific Gravity, Urine 1.003 (*)    Nitrite POSITIVE (*)    Squamous Epithelial / LPF 0-5 (*)    All other components within normal limits  URINE CULTURE  LIPASE, BLOOD   ____________________________________________  EKG   ____________________________________________  RADIOLOGY  CT abdomen and pelvis pending at time of sign-out to Dr. Archie Balboa ____________________________________________   PROCEDURES  Procedure(s) performed: None  Procedures  Critical Care performed: No  ____________________________________________   INITIAL IMPRESSION / ASSESSMENT AND PLAN / ED COURSE  Pertinent labs & imaging results that were available during my care of the patient were reviewed by me and considered in my medical decision making (see chart  for details).  Differential diagnosis includes but is not limited to, abdominal perforation, aortic dissection, cholecystitis, appendicitis, diverticulitis, colitis, esophagitis/gastritis, kidney stone, pyelonephritis, urinary tract infection, aortic aneurysm. All are considered in decision and treatment plan. Based upon the patient's presentation and risk factors, patient is recently postoperative. Discussed with general surgery and we will obtain a repeat CT, urinalysis, and general surgery consultation for further workup. No cardiac or pulmonary symptoms. In addition mild  edema in the left lower extremity, we'll send for ultrasound to exclude DVT. No evidence of vascular aterial abnormality.   Clinical Course as of May 04 1507  Sat May 03, 2017  1235 Consult placed with Dr. Hampton Abbot, will see in ED consult. Agrees with CT abd/plv and will see in ED today.  [MQ]  1450 Discussed with Dr. Hillery Hunter (Vascular). Advises NO anticoagulation for peroneal vein. Have a repeat venous ultrasound in 1 to 2 weeks to assure no progression.   [MQ]    Clinical Course User Index [MQ] Delman Kitten, MD   ----------------------------------------- 3:07 PM on 05/03/2017 -----------------------------------------  Discussed clot in the calf veins with patient and family. She will follow-up with her doctor to assure she gets another ultrasound in about 1 week to reevaluate. Discussed careful return precautions regarding this and to return if increasing swelling, pain, or other new concerns arise in the left lower leg.  Ongoing care assigned to Dr. Archie Balboa. CT scan result pending. Surgery consult pending. Anticipate discharge based upon recommendations of general surgery consult.  ____________________________________________   FINAL CLINICAL IMPRESSION(S) / ED DIAGNOSES  Final diagnoses:  Left peroneal vein thrombosis (HCC)  Lower abdominal pain      NEW MEDICATIONS STARTED DURING THIS VISIT:  New Prescriptions    No medications on file     Note:  This document was prepared using Dragon voice recognition software and may include unintentional dictation errors.     Delman Kitten, MD 05/03/17 [phone]

## 2017-05-03 NOTE — ED Notes (Signed)
Patient transported to CT 

## 2017-05-05 ENCOUNTER — Ambulatory Visit (INDEPENDENT_AMBULATORY_CARE_PROVIDER_SITE_OTHER): Payer: Medicare HMO | Admitting: General Surgery

## 2017-05-05 ENCOUNTER — Telehealth: Payer: Self-pay

## 2017-05-05 ENCOUNTER — Encounter: Payer: Self-pay | Admitting: General Surgery

## 2017-05-05 VITALS — BP 130/83 | HR 86 | Temp 98.1°F | Ht 60.0 in | Wt 157.2 lb

## 2017-05-05 DIAGNOSIS — Z4889 Encounter for other specified surgical aftercare: Secondary | ICD-10-CM

## 2017-05-05 DIAGNOSIS — I82402 Acute embolism and thrombosis of unspecified deep veins of left lower extremity: Secondary | ICD-10-CM

## 2017-05-05 LAB — URINE CULTURE
Culture: >=100000 — AB
SPECIAL REQUESTS: NORMAL

## 2017-05-05 NOTE — Progress Notes (Signed)
Outpatient Surgical Follow Up  05/05/2017  Kimberly [name] is an 71 y.o. female.   Chief Complaint  Patient presents with  . Routine Post Op    Post op: Exploratory laparotomy, transverse colectomy, mobilization of hepatic flexure, end colostomy and mucous fistula creation 04/15/17 Dr. Adonis Huguenin    HPI: 71 year old female returns to clinic for continued follow up. Patient was seen in the ER over the weekend secondary to dysuria and was diagnosed with a urinary tract infection. Her antibiotics sweats. She states since then her dysuria has resolved. She has developed some nausea from the antibiotics however she continues to eat and have good ostomy output. She has minimal pain to her midline only once touch. She continues to have intermittent swelling to her left lower cavity. She denies any fevers, chills, chest pain, shortness of breath.  Past Medical History:  Diagnosis Date  . Breast cancer ([place])   . Breast cancer, left (HCC)    Lumpectomy and rad tx's.  . Chronic back pain   . Chronic knee pain   . Colon cancer ([place])   . Degenerative disc disease, lumbar 04/2013  . Hyperlipidemia   . Neuropathy   . Osteoarthritis    of knee  . Osteoporosis   . Tobacco abuse   . Vitamin D deficiency     Past Surgical History:  Procedure Laterality Date  . APPENDECTOMY  1965  . BREAST EXCISIONAL BIOPSY Left 08/01/2003   lumpectomy rad 11/04-2/28/2005  . CESAREAN SECTION     x3  . COLONOSCOPY W/ POLYPECTOMY    . ESOPHAGOGASTRODUODENOSCOPY (EGD) WITH PROPOFOL N/A 04/04/2017   Procedure: ESOPHAGOGASTRODUODENOSCOPY (EGD) WITH PROPOFOL;  Surgeon: [name], MD;  Location: [place];  Service: Endoscopy;  Laterality: N/A;  . HIP FRACTURE SURGERY Left 01/25/2012  . LAPAROTOMY N/A 04/15/2017   Procedure: EXPLORATORY LAPAROTOMY;  Surgeon: Clayburn Pert, MD;  Location: [facility];  Service: General;  Laterality: N/A;  . NECK SURGERY  12/2011  . WRIST FRACTURE SURGERY      Family History   Problem Relation Age of Onset  . Diabetes Mother        type 2  . Coronary artery disease Mother   . Deep vein thrombosis Mother   . Colon cancer Father   . Asthma Brother   . Diabetes Brother        type 2    Social History:  reports that she has been smoking Cigarettes.  She has a 27.50 pack-year smoking history. She has never used smokeless tobacco. She reports that she drinks alcohol. She reports that she does not use drugs.  Allergies:  Allergies  Allergen Reactions  . Sinus Formula [Cholestatin] Nausea Only  . Iodine Other (See Comments)    Topical iodine she states "if you put it in an open sore it will cause a eating ulcer."    Medications reviewed.    ROS A multipoint review of systems was completed, all pertinent positives and negatives are documented within the history of present illness and remainder are negative   BP 130/83   Pulse 86   Temp 98.1 F (36.7 C) (Oral)   Ht 5' (1.524 m)   Wt 71.3 kg (157 lb 3.2 oz)   BMI 30.70 kg/m   Physical Exam Gen.: No acute distress Chest: Clear to auscultation Heart: Regular rhythm Abdomen: Soft, appropriately tender to midline, and nondistended. End colostomy in the right upper quadrant is pink, patent, productive of gas and stool. Mucous fistula  in the left upper quadrant with healthy-appearing mucosa. Midline wound with multiple areas of skin separation but the previously seen erythema has completely resolved. There is a 1 cm area of opening above the umbilicus that tracks down to the deeper tissues. The fascia probes intact. Extremities: Left lower extremity is mildly swollen compared to the right lower cavity.    Results for orders placed or performed during the hospital encounter of 05/03/17 (from the past 48 hour(s))  CBC     Status: Abnormal   Collection Time: 05/03/17 12:47 PM  Result Value Ref Range   WBC 12.7 (H) 3.6 - 11.0 K/uL   RBC 3.61 (L) 3.80 - 5.20 MIL/uL   Hemoglobin 10.3 (L) 12.0 - 16.0 g/dL    HCT 30.7 (L) 35.0 - 47.0 %   MCV 85.1 80.0 - 100.0 fL   MCH 28.5 26.0 - 34.0 pg   MCHC 33.5 32.0 - 36.0 g/dL   RDW 18.6 (H) 11.5 - 14.5 %   Platelets 891 (H) 150 - 440 K/uL  Comprehensive metabolic panel     Status: Abnormal   Collection Time: 05/03/17 12:47 PM  Result Value Ref Range   Sodium 135 135 - 145 mmol/L   Potassium 3.3 (L) 3.5 - 5.1 mmol/L    Comment: HEMOLYSIS AT THIS LEVEL MAY AFFECT RESULT   Chloride 103 101 - 111 mmol/L   CO2 25 22 - 32 mmol/L   Glucose, Bld 105 (H) 65 - 99 mg/dL   BUN 8 6 - 20 mg/dL   Creatinine, Ser 0.50 0.44 - 1.00 mg/dL   Calcium 7.4 (L) 8.9 - 10.3 mg/dL   Total Protein 5.3 (L) 6.5 - 8.1 g/dL   Albumin 1.6 (L) 3.5 - 5.0 g/dL   AST 14 (L) 15 - 41 U/L   ALT 10 (L) 14 - 54 U/L   Alkaline Phosphatase 73 38 - 126 U/L   Total Bilirubin 0.5 0.3 - 1.2 mg/dL   GFR calc non Af Amer >60 >60 mL/min   GFR calc Af Amer >60 >60 mL/min    Comment: (NOTE) The eGFR has been calculated using the CKD EPI equation. This calculation has not been validated in all clinical situations. eGFR's persistently <60 mL/min signify possible Chronic Kidney Disease.    Anion gap 7 5 - 15  Lipase, blood     Status: None   Collection Time: 05/03/17 12:47 PM  Result Value Ref Range   Lipase 25 11 - 51 U/L  Urinalysis, Complete w Microscopic     Status: Abnormal   Collection Time: 05/03/17 12:47 PM  Result Value Ref Range   Color, Urine AMBER (A) YELLOW    Comment: BIOCHEMICALS MAY BE AFFECTED BY COLOR   APPearance CLEAR (A) CLEAR   Specific Gravity, Urine 1.003 (L) 1.005 - 1.030   pH 6.0 5.0 - 8.0   Glucose, UA NEGATIVE NEGATIVE mg/dL   Hgb urine dipstick NEGATIVE NEGATIVE   Bilirubin Urine NEGATIVE NEGATIVE   Ketones, ur NEGATIVE NEGATIVE mg/dL   Protein, ur NEGATIVE NEGATIVE mg/dL   Nitrite POSITIVE (A) NEGATIVE   Leukocytes, UA NEGATIVE NEGATIVE   RBC / HPF 0-5 0 - 5 RBC/hpf   WBC, UA 0-5 0 - 5 WBC/hpf   Bacteria, UA NONE SEEN NONE SEEN   Squamous Epithelial /  LPF 0-5 (A) NONE SEEN  Urine Culture     Status: Abnormal   Collection Time: 05/03/17 12:47 PM  Result Value Ref Range   Specimen Description URINE, CLEAN  CATCH    Special Requests Normal    Culture >=100,000 COLONIES/mL ESCHERICHIA COLI (A)    Report Status 05/05/2017 FINAL    Organism ID, Bacteria ESCHERICHIA COLI (A)       Susceptibility   Escherichia coli - MIC*    AMPICILLIN >=32 RESISTANT Resistant     CEFAZOLIN >=64 RESISTANT Resistant     CEFTRIAXONE <=1 SENSITIVE Sensitive     CIPROFLOXACIN <=0.25 SENSITIVE Sensitive     GENTAMICIN <=1 SENSITIVE Sensitive     IMIPENEM <=0.25 SENSITIVE Sensitive     NITROFURANTOIN <=16 SENSITIVE Sensitive     TRIMETH/SULFA <=20 SENSITIVE Sensitive     AMPICILLIN/SULBACTAM >=32 RESISTANT Resistant     PIP/TAZO <=4 SENSITIVE Sensitive     Extended ESBL NEGATIVE Sensitive     * >=100,000 COLONIES/mL ESCHERICHIA COLI   Ct Abdomen Pelvis W Contrast  Result Date: 05/03/2017 CLINICAL DATA:  Colon cancer surgery 4 weeks ago. Bilateral leg swelling and lower abdominal pain. EXAM: CT ABDOMEN AND PELVIS WITH CONTRAST TECHNIQUE: Multidetector CT imaging of the abdomen and pelvis was performed using the standard protocol following bolus administration of intravenous contrast. CONTRAST:  139m ISOVUE-300 IOPAMIDOL (ISOVUE-300) INJECTION 61% COMPARISON:  04/21/2017 FINDINGS: Lower chest:  Atelectasis in the right lower lobe. Hepatobiliary: No focal liver abnormality.No evidence of biliary obstruction or stone. Pancreas: Unremarkable. Spleen: Unremarkable. Adrenals/Urinary Tract: Low-density thickening of the bilateral adrenal glands consistent with adenomas reported previously. No hydronephrosis or stone. Unremarkable bladder. Stomach/Bowel: Blind colostomy at the transverse segment. There is in continuity colostomy of the ascending segment. Low-density collection in right lateral of the ostomy is larger than before at 36 mm. Minimal inflammation around this  collection to suggest an abscess. The ostomy through the skin appears irregular, but is distant from the mass lesions seen on abdominal CT 04/08/2017. Would not expect an ostomy metastasis this quickly. The proximal small bowel is dilated and filled with contrast. Small bowel caliber gradually normalizes. In the right lower quadrant loops containing fecalized material consistent with slow transit. Small volume ascites seen around the liver, right interloop space, and pelvis. Mild smooth peritoneal thickening/ enhancement best seen along the liver. No discrete peritoneal nodule noted. Vascular/Lymphatic: Extensive atherosclerotic plaque on the aorta and its branches. No major branch occlusion or critical stenosis is noted. Prominent small bowel mesenteric lymph nodes, nonspecific in the setting of peritoneal thickening. History of lower extremity swelling. Limited opacification of the pelvic veins with no asymmetric expansion or density. Reproductive:Negative Other: No pneumoperitoneum Musculoskeletal: Degenerative changes in the spine. Previous proximal left femur repair. No acute or aggressive finding. IMPRESSION: 1. Partial small bowel obstruction, likely chronic given degree of proximal small bowel dilatation. No discrete transition point, small bowel gradually normalizes in caliber. Ileal loops show fecalized material from slow transit. Follow-up KUB can follow oral contrast to the colostomy. 2. Fluid density around the the ascending ostomy is mildly increased from prior, 3.6 cm. This could be ascites or postoperative collection. 3. New small volume ascites with peritoneal enhancement implying a degree of complexity. No discrete peritoneal mass to strongly suggest peritoneal metastases. Recommend follow-up. 4. Stable thickened appearance of the ascending colostomy. 5. Stable adrenal nodules. Electronically Signed   By: JMonte FantasiaM.D.   On: 05/03/2017 15:11   [place]Venous Img Lower Unilateral Left  Result  Date: 05/03/2017 CLINICAL DATA:  Left leg pain and swelling for 2-3 weeks which has worsened over the past 2 days. EXAM: LEFT LOWER EXTREMITY VENOUS DOPPLER ULTRASOUND TECHNIQUE: Gray-scale sonography  with graded compression, as well as color Doppler and duplex ultrasound were performed to evaluate the lower extremity deep venous systems from the level of the common femoral vein and including the common femoral, femoral, profunda femoral, popliteal and calf veins including the posterior tibial, peroneal and gastrocnemius veins when visible. The superficial great saphenous vein was also interrogated. Spectral Doppler was utilized to evaluate flow at rest and with distal augmentation maneuvers in the common femoral, femoral and popliteal veins. COMPARISON:  None. FINDINGS: Contralateral Common Femoral Vein: Respiratory phasicity is normal and symmetric with the symptomatic side. No evidence of thrombus. Normal compressibility. Common Femoral Vein: No evidence of thrombus. Normal compressibility, respiratory phasicity and response to augmentation. Saphenofemoral Junction: No evidence of thrombus. Normal compressibility and flow on color Doppler imaging. Profunda Femoral Vein: No evidence of thrombus. Normal compressibility and flow on color Doppler imaging. Femoral Vein: No evidence of thrombus. Normal compressibility, respiratory phasicity and response to augmentation. Popliteal Vein: No evidence of thrombus. Normal compressibility, respiratory phasicity and response to augmentation. Calf Veins: Occlusive thrombus is seen in the peroneal vein. Superficial Great Saphenous Vein: No evidence of thrombus. Normal compressibility and flow on color Doppler imaging. Venous Reflux:  None. Other Findings: Subcutaneous edema is seen in the region of concern just superior to the lateral aspect of the left knee. IMPRESSION: Occlusive thrombus is identified in the left peroneal vein. No other thrombus is identified. Nonspecific  subcutaneous edema in the region of concern superior to the lateral aspect of the left knee. Electronically Signed   By: Inge Rise M.D.   On: 05/03/2017 14:11    Assessment/Plan:  1. Aftercare following surgery 71 year old female now with an Escherichia coli urinary tract infection. It is susceptible to the new antibiotics. She is improving. Counseled her to complete that course of antibiotics. The opening to her midline wound was treated with silver nitrate today. Discussed that this will change the drainage output. Discussed continuing local wound care and ostomy and mucous fistula care. Discussed signs and symptoms of infection and return to clinic medially should they occur.  Clinic next week for additional wound check and then the following week for additional check with me.  2. Acute deep vein thrombosis (DVT) of left lower extremity, unspecified vein (HCC) Patient with superficial thrombus of the left lower chimney but continued intermittent swelling. As per plan from the ER consultation with vascular a repeat ultrasound will be obtained later this week and she will continue follow-up with [place] and with vascular surgery. - US Venous Img Lower Unilateral Left; Future     Clayburn Pert, MD [facility] General Surgeon  05/05/2017,9:53 AM

## 2017-05-05 NOTE — Patient Instructions (Addendum)
We will help arrange your repeat Ultrasound with Vascular. We will call as soon as we make contact with their office.  Please follow-up as scheduled below.

## 2017-05-05 NOTE — Telephone Encounter (Signed)
Spoke with Dr. Laverta Baltimore staff and she was a moonlighting provider for our hospital the night that patient was seen. Therefore, LLE Doppler US has been ordered today by Dr. Adonis Huguenin.  [place] has been scheduled at [facility] for 05/09/17 at 0930am.  Call was made to patient's daughter, Neoma Laming per patient request. No answer. Left voicemail with above information.

## 2017-05-06 ENCOUNTER — Encounter: Payer: Self-pay | Admitting: Family Medicine

## 2017-05-06 ENCOUNTER — Ambulatory Visit (INDEPENDENT_AMBULATORY_CARE_PROVIDER_SITE_OTHER): Payer: Medicare HMO | Admitting: Family Medicine

## 2017-05-06 VITALS — BP 128/68 | HR 88 | Temp 98.0°F | Resp 16 | Wt 162.0 lb

## 2017-05-06 DIAGNOSIS — M5136 Other intervertebral disc degeneration, lumbar region: Secondary | ICD-10-CM

## 2017-05-06 DIAGNOSIS — I82492 Acute embolism and thrombosis of other specified deep vein of left lower extremity: Secondary | ICD-10-CM | POA: Diagnosis not present

## 2017-05-06 DIAGNOSIS — F17210 Nicotine dependence, cigarettes, uncomplicated: Secondary | ICD-10-CM

## 2017-05-06 DIAGNOSIS — M51369 Other intervertebral disc degeneration, lumbar region without mention of lumbar back pain or lower extremity pain: Secondary | ICD-10-CM

## 2017-05-06 DIAGNOSIS — I82452 Acute embolism and thrombosis of left peroneal vein: Secondary | ICD-10-CM

## 2017-05-06 MED ORDER — HYDROCODONE-ACETAMINOPHEN 7.5-325 MG PO TABS: 1.0000 | ORAL_TABLET | Freq: Four times a day (QID) | ORAL | 0 refills | Status: DC | PRN

## 2017-05-06 MED ORDER — VARENICLINE TARTRATE 1 MG PO TABS: 1.0000 mg | ORAL_TABLET | Freq: Two times a day (BID) | ORAL | 2 refills | Status: AC

## 2017-05-06 MED ORDER — VARENICLINE TARTRATE 0.5 MG X 11 & 1 MG X 42 PO MISC: ORAL | 0 refills | Status: DC

## 2017-05-06 NOTE — Progress Notes (Signed)
Patient: Kimberly [name] Female    DOB: Dec 23, 1945   71 y.o.   MRN: [id number] Visit Date: 05/06/2017  Today's Provider: Lelon Huh, MD   Chief Complaint  Patient presents with  . Hospitalization Follow-up   Subjective:    HPI  Follow Up ER Visit  Patient is here for ER follow up.  She was recently seen at [facility] for left leg pain and abdominal pain on 05/03/2017. She was found to have peroneal vein thrombosis and UTI  Treatment for this included prescription fo cipro and plan for follow up u/s after a week.   She reports good compliance with treatment. She reports this condition is Improved.  Patient reports that the US revealed that she has a blood clot in her left leg. She is currently experiencing tenderness and swelling in her left leg. She is also wanting refills on her pain medication as she just had colon surgery for SBO about 1 month ago.       Allergies  Allergen Reactions  . Sinus Formula [Cholestatin] Nausea Only  . Iodine Other (See Comments)    Topical iodine she states "if you put it in an open sore it will cause a eating ulcer."     Current Outpatient Prescriptions:  .  budesonide-formoterol (SYMBICORT) 80-4.5 MCG/ACT inhaler, Inhale 2 puffs into the lungs 2 (two) times daily., Disp: , Rfl:  .  Calcium Citrate-Vitamin D (CITRACAL/VITAMIN D PO), Take by mouth., Disp: , Rfl:  .  ciprofloxacin (CIPRO) 500 MG tablet, Take 1 tablet (500 mg total) by mouth 2 (two) times daily., Disp: 14 tablet, Rfl: 0 .  [employer] GAS RELIEF 80 MG chewable tablet, Chew 1 tablet by mouth as needed., Disp: , Rfl: 0 .  feeding supplement (BOOST / RESOURCE BREEZE) LIQD, Take 1 Container by mouth 3 (three) times daily between meals., Disp: 30 Container, Rfl: 0 .  HYDROcodone-acetaminophen (NORCO) 7.5-325 MG tablet, Take 1-2 tablets by mouth every 6 (six) hours as needed for moderate pain., Disp: 120 tablet, Rfl: 0 .  meloxicam (MOBIC) 15 MG tablet, TAKE 1 TABLET EVERY DAY AS NEEDED,  Disp: 90 tablet, Rfl: 3 .  nicotine (NICODERM CQ - DOSED IN MG/24 HOURS) 21 mg/24hr patch, Place 1 patch (21 mg total) onto the skin daily., Disp: 28 patch, Rfl: 0 .  omeprazole (PRILOSEC) 20 MG capsule, TAKE 1 CAPSULE EVERY DAY, Disp: 90 capsule, Rfl: 4 .  pravastatin (PRAVACHOL) 40 MG tablet, TAKE 1 TABLET (40 MG TOTAL) BY MOUTH DAILY., Disp: 90 tablet, Rfl: 4 .  Probiotic Product (PROBIOTIC DAILY PO), Take by mouth., Disp: , Rfl:  .  Simethicone 80 MG TABS, Take 1 tablet (80 mg total) by mouth 3 (three) times daily., Disp: 90 tablet, Rfl: 0 .  Vitamin D, Ergocalciferol, (DRISDOL) 50000 units CAPS capsule, Take 1 capsule (50,000 Units total) by mouth every 7 (seven) days., Disp: 12 capsule, Rfl: 0  Review of Systems  Constitutional: Positive for activity change and fatigue.  Cardiovascular: Positive for leg swelling.  Gastrointestinal: Positive for abdominal pain and nausea.  Musculoskeletal: Positive for myalgias.  Neurological: Positive for weakness.    Social History  Substance Use Topics  . Smoking status: Current Every Day Smoker    Packs/day: 0.50    Years: 55.00    Types: Cigarettes  . Smokeless tobacco: Never Used     Comment: started age 47 1/2 to 1 ppd  . Alcohol use 0.0 oz/week     Comment:  occasionally drinks beer   Objective:   BP 128/68 (BP Location: Right Arm, Patient Position: Sitting, Cuff Size: Normal)   Pulse 88   Temp 98 F (36.7 C)   Resp 16   Wt 162 lb (73.5 kg)   SpO2 97%   BMI 31.64 kg/m     Physical Exam   General Appearance:    Alert, cooperative, no distress  Eyes:    PERRL, conjunctiva/corneas clear, EOM's intact       Lungs:     Clear to auscultation bilaterally, respirations unlabored  Heart:    Regular rate and rhythm  Neurologic:   Awake, alert, oriented x 3. No apparent focal neurological           defect.   Ext: Mild swelling left lower extremity. No cords. A few varicosities noted.        Assessment & Plan:     1. Left  peroneal vein thrombosis (HCC) Stable. Anticipate follow up ultrasound later this week to ensure it is not progressing.   2. DDD (degenerative disc disease), lumbar She is out of her pain medication and requests refill.  - HYDROcodone-acetaminophen (NORCO) 7.5-325 MG tablet; Take 1-2 tablets by mouth every 6 (six) hours as needed for moderate pain.  Dispense: 120 tablet; Refill: 0  3. Smoking greater than 30 pack years  - varenicline (CHANTIX CONTINUING MONTH PAK) 1 MG tablet; Take 1 tablet (1 mg total) by mouth 2 (two) times daily.  Dispense: 60 tablet; Refill: 2 - varenicline (CHANTIX STARTING MONTH PAK) 0.5 MG X 11 & 1 MG X 42 tablet; Take one 0.5 mg tablet daily for 3 days, then take one 0.5 mg tablet twice daily for 4 days, then take one 1 mg tablet twice daily  Dispense: 53 tablet; Refill: 0       Lelon Huh, MD  [facility]

## 2017-05-08 DIAGNOSIS — M179 Osteoarthritis of knee, unspecified: Secondary | ICD-10-CM | POA: Diagnosis not present

## 2017-05-08 DIAGNOSIS — C189 Malignant neoplasm of colon, unspecified: Secondary | ICD-10-CM | POA: Diagnosis not present

## 2017-05-08 DIAGNOSIS — G8929 Other chronic pain: Secondary | ICD-10-CM | POA: Diagnosis not present

## 2017-05-08 DIAGNOSIS — M5136 Other intervertebral disc degeneration, lumbar region: Secondary | ICD-10-CM | POA: Diagnosis not present

## 2017-05-08 DIAGNOSIS — G629 Polyneuropathy, unspecified: Secondary | ICD-10-CM | POA: Diagnosis not present

## 2017-05-08 DIAGNOSIS — F17210 Nicotine dependence, cigarettes, uncomplicated: Secondary | ICD-10-CM | POA: Diagnosis not present

## 2017-05-08 DIAGNOSIS — Z483 Aftercare following surgery for neoplasm: Secondary | ICD-10-CM | POA: Diagnosis not present

## 2017-05-08 DIAGNOSIS — Z433 Encounter for attention to colostomy: Secondary | ICD-10-CM | POA: Diagnosis not present

## 2017-05-08 DIAGNOSIS — E785 Hyperlipidemia, unspecified: Secondary | ICD-10-CM | POA: Diagnosis not present

## 2017-05-09 ENCOUNTER — Ambulatory Visit
Admission: RE | Admit: 2017-05-09 | Discharge: 2017-05-09 | Disposition: A | Discharge status: Home / Self Care | Payer: Medicare HMO | Source: Ambulatory Visit | Attending: General Surgery | Admitting: General Surgery

## 2017-05-09 ENCOUNTER — Telehealth: Payer: Self-pay

## 2017-05-09 DIAGNOSIS — I82402 Acute embolism and thrombosis of unspecified deep veins of left lower extremity: Secondary | ICD-10-CM | POA: Insufficient documentation

## 2017-05-09 DIAGNOSIS — I824Z2 Acute embolism and thrombosis of unspecified deep veins of left distal lower extremity: Secondary | ICD-10-CM | POA: Diagnosis not present

## 2017-05-09 MED ORDER — FUROSEMIDE 20 MG PO TABS: 20.0000 mg | ORAL_TABLET | Freq: Every day | ORAL | 3 refills | Status: DC

## 2017-05-09 NOTE — Telephone Encounter (Signed)
Reviewed Ultrasound results of LLE. Results normal. Patient will continue with follow-up as scheduled with Dr. Dahlia Byes next week.  Call made to patient. Results were reviewed with her. Reviewed her follow-up appointment info for next week. She verbalizes understanding.

## 2017-05-09 NOTE — Telephone Encounter (Signed)
Patient called and states she had Doppler US today and just received a call from [facility] surgical advising her that DVT is completely cleared. She was advised since she was having bad swelling in her legs still especially left and burning and itching to call her PCP and ask about fluid pill. Patient states swelling is better over night and then gets worse. No chest pain or tightness present, no weakness or numbness.-aa

## 2017-05-09 NOTE — Telephone Encounter (Signed)
Have sent in prescription for furosemide to take daily as needed for swelling.

## 2017-05-09 NOTE — Telephone Encounter (Signed)
Patient was notified.

## 2017-05-13 DIAGNOSIS — G629 Polyneuropathy, unspecified: Secondary | ICD-10-CM | POA: Diagnosis not present

## 2017-05-13 DIAGNOSIS — Z483 Aftercare following surgery for neoplasm: Secondary | ICD-10-CM | POA: Diagnosis not present

## 2017-05-13 DIAGNOSIS — M5136 Other intervertebral disc degeneration, lumbar region: Secondary | ICD-10-CM | POA: Diagnosis not present

## 2017-05-13 DIAGNOSIS — Z433 Encounter for attention to colostomy: Secondary | ICD-10-CM | POA: Diagnosis not present

## 2017-05-13 DIAGNOSIS — F17210 Nicotine dependence, cigarettes, uncomplicated: Secondary | ICD-10-CM | POA: Diagnosis not present

## 2017-05-13 DIAGNOSIS — E785 Hyperlipidemia, unspecified: Secondary | ICD-10-CM | POA: Diagnosis not present

## 2017-05-13 DIAGNOSIS — G8929 Other chronic pain: Secondary | ICD-10-CM | POA: Diagnosis not present

## 2017-05-13 DIAGNOSIS — M179 Osteoarthritis of knee, unspecified: Secondary | ICD-10-CM | POA: Diagnosis not present

## 2017-05-13 DIAGNOSIS — C189 Malignant neoplasm of colon, unspecified: Secondary | ICD-10-CM | POA: Diagnosis not present

## 2017-05-14 ENCOUNTER — Encounter: Payer: Self-pay | Admitting: Surgery

## 2017-05-15 ENCOUNTER — Ambulatory Visit (INDEPENDENT_AMBULATORY_CARE_PROVIDER_SITE_OTHER): Payer: Medicare HMO | Admitting: Surgery

## 2017-05-15 ENCOUNTER — Encounter: Payer: Self-pay | Admitting: Surgery

## 2017-05-15 VITALS — BP 159/89 | HR 82 | Temp 97.8°F | Wt 147.0 lb

## 2017-05-15 DIAGNOSIS — Z09 Encounter for follow-up examination after completed treatment for conditions other than malignant neoplasm: Secondary | ICD-10-CM

## 2017-05-15 NOTE — Patient Instructions (Addendum)
I will contact Domenic Moras so she could call you and meet you so she could help you with the stoma paste and rings.  Dr. Adonis Huguenin will place your Port-A-Cath on 05/21/2017 at [facility]

## 2017-05-15 NOTE — Progress Notes (Signed)
Outpatient Surgical Follow Up  05/15/2017  Kimberly [name] is an 71 y.o. female.   Chief Complaint  Patient presents with  . Routine Post Op    Exploratory Laparotomy with Colostomy and Mucous Fistula creation (04/15/17)- Dr. Adonis Huguenin    HPI: Kimberly [name] is a 71 y.o. female  s/p transverse colectomy with end colostomy and mucous fistula creation with Dr. Adonis Huguenin for obst colon ca.  UTI resolved, peroneal DVT resolved. She is doing well and her ostomy is working. Taking PO, no fevers or chills. Small abd wound continues to improve. No evidence of active infection. She is in need for a port  Past Medical History:  Diagnosis Date  . Breast cancer ([place])   . Breast cancer, left (HCC)    Lumpectomy and rad tx's.  . Chronic back pain   . Chronic knee pain   . Colon cancer ([place])   . Degenerative disc disease, lumbar 04/2013  . Hyperlipidemia   . Neuropathy   . Osteoarthritis    of knee  . Osteoporosis   . Tobacco abuse   . Vitamin D deficiency     Past Surgical History:  Procedure Laterality Date  . APPENDECTOMY  1965  . BREAST EXCISIONAL BIOPSY Left 08/01/2003   lumpectomy rad 11/04-2/28/2005  . CESAREAN SECTION     x3  . COLONOSCOPY W/ POLYPECTOMY    . ESOPHAGOGASTRODUODENOSCOPY (EGD) WITH PROPOFOL N/A 04/04/2017   Procedure: ESOPHAGOGASTRODUODENOSCOPY (EGD) WITH PROPOFOL;  Surgeon: [name], MD;  Location: [place];  Service: Endoscopy;  Laterality: N/A;  . HIP FRACTURE SURGERY Left 01/25/2012  . LAPAROTOMY N/A 04/15/2017   Procedure: EXPLORATORY LAPAROTOMY;  Surgeon: Clayburn Pert, MD;  Location: [facility];  Service: General;  Laterality: N/A;  . NECK SURGERY  12/2011  . WRIST FRACTURE SURGERY      Family History  Problem Relation Age of Onset  . Diabetes Mother        type 2  . Coronary artery disease Mother   . Deep vein thrombosis Mother   . Colon cancer Father   . Asthma Brother   . Diabetes Brother        type 2    Social History:  reports  that she has been smoking Cigarettes.  She has a 27.50 pack-year smoking history. She has never used smokeless tobacco. She reports that she drinks alcohol. She reports that she does not use drugs.  Allergies:  Allergies  Allergen Reactions  . Sinus Formula [Cholestatin] Nausea Only  . Iodine Other (See Comments)    Topical iodine she states "if you put it in an open sore it will cause a eating ulcer."    Medications reviewed.    ROS Full ROS performed and is otherwise negative other than what is stated in HPI   BP (!) 159/89   Pulse 82   Temp 97.8 F (36.6 C) (Oral)   Wt 66.7 kg (147 lb)   BMI 28.71 kg/m    Physical Exam  Constitutional: She is oriented to person, place, and time and well-developed, well-nourished, and in no distress.  Neck: No JVD present. No tracheal deviation present.  Pulmonary/Chest: Effort normal. No stridor. No respiratory distress. She exhibits no tenderness.  Abdominal: Soft. She exhibits no distension. There is no tenderness. There is no rebound and no guarding.  Colostomy working. Small midline wound 2cm in length, good granulation tissue, no infection. MF on the left. No peritonitis or infection  Neurological: She is alert and oriented  to person, place, and time. Gait normal. GCS score is 15.  Skin: Skin is warm and dry.  Psychiatric: Mood, memory, affect and judgment normal.  Nursing note and vitals reviewed.  Assessment/Plan: Doing well from abd perspective. She will need port since chemo will be started in 2 weeks. Does with the patient in detail about the procedure. Risks benefits and possible complications including but not limited to: Bleeding, infection and pneumothorax, vascular injury. She understands and wishes to proceed. We'll schedule her poor for Dr. Adonis Huguenin next week, all questions were answered   [name] Hamman, MD [place] Surgeon

## 2017-05-16 ENCOUNTER — Encounter
Admission: RE | Admit: 2017-05-16 | Discharge: 2017-05-16 | Disposition: A | Discharge status: Home / Self Care | Payer: Medicare HMO | Source: Ambulatory Visit | Attending: General Surgery | Admitting: General Surgery

## 2017-05-16 ENCOUNTER — Telehealth: Payer: Self-pay | Admitting: General Surgery

## 2017-05-16 DIAGNOSIS — M79673 Pain in unspecified foot: Secondary | ICD-10-CM | POA: Insufficient documentation

## 2017-05-16 DIAGNOSIS — L989 Disorder of the skin and subcutaneous tissue, unspecified: Secondary | ICD-10-CM | POA: Diagnosis not present

## 2017-05-16 DIAGNOSIS — G629 Polyneuropathy, unspecified: Secondary | ICD-10-CM | POA: Insufficient documentation

## 2017-05-16 DIAGNOSIS — Z800 Family history of malignant neoplasm of digestive organs: Secondary | ICD-10-CM | POA: Diagnosis not present

## 2017-05-16 DIAGNOSIS — K56609 Unspecified intestinal obstruction, unspecified as to partial versus complete obstruction: Secondary | ICD-10-CM | POA: Diagnosis not present

## 2017-05-16 DIAGNOSIS — Z01810 Encounter for preprocedural cardiovascular examination: Secondary | ICD-10-CM | POA: Insufficient documentation

## 2017-05-16 DIAGNOSIS — Z01812 Encounter for preprocedural laboratory examination: Secondary | ICD-10-CM | POA: Insufficient documentation

## 2017-05-16 DIAGNOSIS — R9431 Abnormal electrocardiogram [ECG] [EKG]: Secondary | ICD-10-CM | POA: Diagnosis not present

## 2017-05-16 DIAGNOSIS — R062 Wheezing: Secondary | ICD-10-CM | POA: Insufficient documentation

## 2017-05-16 DIAGNOSIS — C184 Malignant neoplasm of transverse colon: Secondary | ICD-10-CM | POA: Diagnosis not present

## 2017-05-16 DIAGNOSIS — E785 Hyperlipidemia, unspecified: Secondary | ICD-10-CM | POA: Diagnosis not present

## 2017-05-16 DIAGNOSIS — M1710 Unilateral primary osteoarthritis, unspecified knee: Secondary | ICD-10-CM | POA: Insufficient documentation

## 2017-05-16 DIAGNOSIS — R609 Edema, unspecified: Secondary | ICD-10-CM | POA: Diagnosis not present

## 2017-05-16 DIAGNOSIS — M25559 Pain in unspecified hip: Secondary | ICD-10-CM | POA: Insufficient documentation

## 2017-05-16 DIAGNOSIS — R079 Chest pain, unspecified: Secondary | ICD-10-CM | POA: Diagnosis not present

## 2017-05-16 HISTORY — DX: Family history of other specified conditions: Z84.89

## 2017-05-16 HISTORY — DX: Gastro-esophageal reflux disease without esophagitis: K21.9

## 2017-05-16 HISTORY — DX: Dyspnea, unspecified: R06.00

## 2017-05-16 LAB — POTASSIUM: Potassium: 4.1 mmol/L (ref 3.5–5.1)

## 2017-05-16 NOTE — Pre-Procedure Instructions (Signed)
Dr Andree Elk reviewed pre-op EKG. No new orders. Ok to proceed.

## 2017-05-16 NOTE — Telephone Encounter (Signed)
Pt advised of pre op date/time and sx date. Sx: 05/21/17 with Dr Forrestine Him Placement.  Pre op: 05/16/17 @ 1:45pm--Office.   Patient made aware to call [phone], between 1-3:00pm the day before surgery, to find out what time to arrive.

## 2017-05-16 NOTE — Patient Instructions (Signed)
Your procedure is scheduled on: Wednesday 05/21/17 Report to [place]. 2ND FLOOR MEDICAL MALL ENTRANCE. To find out your arrival time please call [phone] between 1PM - 3PM on Tuesday 05/20/17.  Remember: Instructions that are not followed completely may result in serious medical risk, up to and including death, or upon the discretion of your surgeon and anesthesiologist your surgery may need to be rescheduled.    __X__ 1. Do not eat food or drink liquids after midnight. No gum chewing or hard candies.     __X__ 2. No Alcohol for 24 hours before or after surgery.   ____ 3. Bring all medications with you on the day of surgery if instructed.    __X__ 4. Notify your doctor if there is any change in your medical condition     (cold, fever, infections).             ___X__5. No smoking within 24 hours of your surgery.     Do not wear jewelry, make-up, hairpins, clips or nail polish.  Do not wear lotions, powders, or perfumes.   Do not shave 48 hours prior to surgery. Men may shave face and neck.  Do not bring valuables to the hospital.    [facility] is not responsible for any belongings or valuables.               Contacts, dentures or bridgework may not be worn into surgery.  Leave your suitcase in the car. After surgery it may be brought to your room.  For patients admitted to the hospital, discharge time is determined by your                treatment team.   Patients discharged the day of surgery will not be allowed to drive home.   Please read over the following fact sheets that you were given:   MRSA Information   __X__ Take these medicines the morning of surgery with A SIP OF WATER:    1. OMEPRAZOLE  2. PRAVASTATIN  3.   4.  5.  6.  ____ Fleet Enema (as directed)   __X__ Use CHG Soap as directed OR SAGE WIPES  __X__ Use inhalers on the day of surgery  ____ Stop metformin 2 days prior to surgery    ____ Take 1/2 of usual insulin dose the night before surgery and  none on the morning of surgery.   ____ Stop Coumadin/Plavix/aspirin on   __X__ Stop Anti-inflammatories such as Advil, Aleve, Ibuprofen, Motrin, Naproxen, Naprosyn, Goodies,powder, or aspirin products.  OK to take Tylenol.  (STOP MELOXICAM AND IBUPROFEN UNTIL AFTER SURGERY)  ____ Stop supplements until after surgery.    ____ Bring C-Pap to the hospital.

## 2017-05-20 MED ORDER — CEFAZOLIN SODIUM-DEXTROSE 2-4 GM/100ML-% IV SOLN
2.0000 g | INTRAVENOUS | Status: AC
  Administered 2017-05-21: 2 g via INTRAVENOUS

## 2017-05-20 NOTE — Patient Instructions (Signed)
Oxaliplatin Injection What is this medicine? OXALIPLATIN (ox AL i PLA tin) is a chemotherapy drug. It targets fast dividing cells, like cancer cells, and causes these cells to die. This medicine is used to treat cancers of the colon and rectum, and many other cancers. This medicine may be used for other purposes; ask your health care provider or pharmacist if you have questions. COMMON BRAND NAME(S): Eloxatin What should I tell my health care provider before I take this medicine? They need to know if you have any of these conditions: -kidney disease -an unusual or allergic reaction to oxaliplatin, other chemotherapy, other medicines, foods, dyes, or preservatives -pregnant or trying to get pregnant -breast-feeding How should I use this medicine? This drug is given as an infusion into a vein. It is administered in a hospital or clinic by a specially trained health care professional. Talk to your pediatrician regarding the use of this medicine in children. Special care may be needed. Overdosage: If you think you have taken too much of this medicine contact a poison control center or emergency room at once. NOTE: This medicine is only for you. Do not share this medicine with others. What if I miss a dose? It is important not to miss a dose. Call your doctor or health care professional if you are unable to keep an appointment. What may interact with this medicine? -medicines to increase blood counts like filgrastim, pegfilgrastim, sargramostim -probenecid -some antibiotics like amikacin, gentamicin, neomycin, polymyxin B, streptomycin, tobramycin -zalcitabine Talk to your doctor or health care professional before taking any of these medicines: -acetaminophen -aspirin -ibuprofen -ketoprofen -naproxen This list may not describe all possible interactions. Give your health care provider a list of all the medicines, herbs, non-prescription drugs, or dietary supplements you use. Also tell them if  you smoke, drink alcohol, or use illegal drugs. Some items may interact with your medicine. What should I watch for while using this medicine? Your condition will be monitored carefully while you are receiving this medicine. You will need important blood work done while you are taking this medicine. This medicine can make you more sensitive to cold. Do not drink cold drinks or use ice. Cover exposed skin before coming in contact with cold temperatures or cold objects. When out in cold weather wear warm clothing and cover your mouth and nose to warm the air that goes into your lungs. Tell your doctor if you get sensitive to the cold. This drug may make you feel generally unwell. This is not uncommon, as chemotherapy can affect healthy cells as well as cancer cells. Report any side effects. Continue your course of treatment even though you feel ill unless your doctor tells you to stop. In some cases, you may be given additional medicines to help with side effects. Follow all directions for their use. Call your doctor or health care professional for advice if you get a fever, chills or sore throat, or other symptoms of a cold or flu. Do not treat yourself. This drug decreases your body's ability to fight infections. Try to avoid being around people who are sick. This medicine may increase your risk to bruise or bleed. Call your doctor or health care professional if you notice any unusual bleeding. Be careful brushing and flossing your teeth or using a toothpick because you may get an infection or bleed more easily. If you have any dental work done, tell your dentist you are receiving this medicine. Avoid taking products that contain aspirin, acetaminophen, ibuprofen, naproxen,   or ketoprofen unless instructed by your doctor. These medicines may hide a fever. Do not become pregnant while taking this medicine. Women should inform their doctor if they wish to become pregnant or think they might be pregnant. There  is a potential for serious side effects to an unborn child. Talk to your health care professional or pharmacist for more information. Do not breast-feed an infant while taking this medicine. Call your doctor or health care professional if you get diarrhea. Do not treat yourself. What side effects may I notice from receiving this medicine? Side effects that you should report to your doctor or health care professional as soon as possible: -allergic reactions like skin rash, itching or hives, swelling of the face, lips, or tongue -low blood counts - This drug may decrease the number of white blood cells, red blood cells and platelets. You may be at increased risk for infections and bleeding. -signs of infection - fever or chills, cough, sore throat, pain or difficulty passing urine -signs of decreased platelets or bleeding - bruising, pinpoint red spots on the skin, black, tarry stools, nosebleeds -signs of decreased red blood cells - unusually weak or tired, fainting spells, lightheadedness -breathing problems -chest pain, pressure -cough -diarrhea -jaw tightness -mouth sores -nausea and vomiting -pain, swelling, redness or irritation at the injection site -pain, tingling, numbness in the hands or feet -problems with balance, talking, walking -redness, blistering, peeling or loosening of the skin, including inside the mouth -trouble passing urine or change in the amount of urine Side effects that usually do not require medical attention (report to your doctor or health care professional if they continue or are bothersome): -changes in vision -constipation -hair loss -loss of appetite -metallic taste in the mouth or changes in taste -stomach pain This list may not describe all possible side effects. Call your doctor for medical advice about side effects. You may report side effects to FDA at 1-800-FDA-1088. Where should I keep my medicine? This drug is given in a hospital or clinic and  will not be stored at home. NOTE: This sheet is a summary. It may not cover all possible information. If you have questions about this medicine, talk to your doctor, pharmacist, or health care provider.  2018 Elsevier/Gold Standard (2008-04-12 17:22:47) Fluorouracil, 5FU; Diclofenac topical cream What is this medicine? FLUOROURACIL; DICLOFENAC (flure oh YOOR a sil; dye KLOE fen ak) is a combination of a topical chemotherapy agent and non-steroidal anti-inflammatory drug (NSAID). It is used on the skin to treat skin cancer and skin conditions that could become cancer. This medicine may be used for other purposes; ask your health care provider or pharmacist if you have questions. COMMON BRAND NAME(S): FLUORAC What should I tell my health care provider before I take this medicine? They need to know if you have any of these conditions: -bleeding problems -cigarette smoker -DPD enzyme deficiency -heart disease -high blood pressure -if you frequently drink alcohol containing drinks -kidney disease -liver disease -open or infected skin -stomach problems -swelling or open sores at the treatment site -recent or planned coronary artery bypass graft (CABG) surgery -an unusual or allergic reaction to fluorouracil, diclofenac, aspirin, other NSAIDs, other medicines, foods, dyes, or preservatives -pregnant or trying to get pregnant -breast-feeding How should I use this medicine? This medicine is only for use on the skin. Follow the directions on the prescription label. Wash hands before and after use. Wash affected area and gently pat dry. To apply this medicine use a cotton-tipped applicator,  or use gloves if applying with fingertips. If applied with unprotected fingertips, it is very important to wash your hands well after you apply this medicine. Avoid applying to the eyes, nose, or mouth. Apply enough medicine to cover the affected area. You can cover the area with a light gauze dressing, but do not  use tight or air-tight dressings. Finish the full course prescribed by your doctor or health care professional, even if you think your condition is better. Do not stop taking except on the advice of your doctor or health care professional. Talk to your pediatrician regarding the use of this medicine in children. Special care may be needed. Overdosage: If you think you have taken too much of this medicine contact a poison control center or emergency room at once. NOTE: This medicine is only for you. Do not share this medicine with others. What if I miss a dose? If you miss a dose, apply it as soon as you can. If it is almost time for your next dose, only use that dose. Do not apply extra doses. Contact your doctor or health care professional if you miss more than one dose. What may interact with this medicine? Interactions are not expected. Do not use any other skin products without telling your doctor or health care professional. This list may not describe all possible interactions. Give your health care provider a list of all the medicines, herbs, non-prescription drugs, or dietary supplements you use. Also tell them if you smoke, drink alcohol, or use illegal drugs. Some items may interact with your medicine. What should I watch for while using this medicine? Visit your doctor or health care professional for checks on your progress. You will need to use this medicine for 2 to 6 weeks. This may be longer depending on the condition being treated. You may not see full healing for another 1 to 2 months after you stop using the medicine. Treated areas of skin can look unsightly during and for several weeks after treatment with this medicine. This medicine can make you more sensitive to the sun. Keep out of the sun. If you cannot avoid being in the sun, wear protective clothing and use sunscreen. Do not use sun lamps or tanning beds/booths. If a pet comes in contact with the area where this medicine was  applied to your skin or if it is ingested, they may have a serious risk of side effects. If accidental contact happens, the skin of the pet should be washed right away with soap and water. Contact your vet right away if your pet becomes exposed. Do not become pregnant while taking this medicine. Women should inform their doctor if they wish to become pregnant or think they might be pregnant. There is a potential for serious side effects to an unborn child. Talk to your health care professional or pharmacist for more information. What side effects may I notice from receiving this medicine? Side effects that you should report to your doctor or health care professional as soon as possible: -allergic reactions like skin rash, itching or hives, swelling of the face, lips, or tongue -black or bloody stools, blood in the urine or vomit -blurred vision -chest pain -difficulty breathing or wheezing -redness, blistering, peeling or loosening of the skin, including inside the mouth -severe redness and swelling of normal skin -slurred speech or weakness on one side of the body -trouble passing urine or change in the amount of urine -unexplained weight gain or swelling -unusually weak or  tired -yellowing of eyes or skin Side effects that usually do not require medical attention (report to your doctor or health care professional if they continue or are bothersome): -increased sensitivity of the skin to sun and ultraviolet light -pain and burning of the affected area -scaling or swelling of the affected area -skin rash, itching of the affected area -tenderness This list may not describe all possible side effects. Call your doctor for medical advice about side effects. You may report side effects to FDA at 1-800-FDA-1088. Where should I keep my medicine? Keep out of the reach of children and pets. Store at room temperature between 20 and 25 degrees C (68 and 77 degrees F). Throw away any unused medicine  after the expiration date. NOTE: This sheet is a summary. It may not cover all possible information. If you have questions about this medicine, talk to your doctor, pharmacist, or health care provider.  2018 Elsevier/Gold Standard (2015-10-27 17:35:08) Leucovorin injection What is this medicine? LEUCOVORIN (loo koe VOR in) is used to prevent or treat the harmful effects of some medicines. This medicine is used to treat anemia caused by a low amount of folic acid in the body. It is also used with 5-fluorouracil (5-FU) to treat colon cancer. This medicine may be used for other purposes; ask your health care provider or pharmacist if you have questions. What should I tell my health care provider before I take this medicine? They need to know if you have any of these conditions: -anemia from low levels of vitamin B-12 in the blood -an unusual or allergic reaction to leucovorin, folic acid, other medicines, foods, dyes, or preservatives -pregnant or trying to get pregnant -breast-feeding How should I use this medicine? This medicine is for injection into a muscle or into a vein. It is given by a health care professional in a hospital or clinic setting. Talk to your pediatrician regarding the use of this medicine in children. Special care may be needed. Overdosage: If you think you have taken too much of this medicine contact a poison control center or emergency room at once. NOTE: This medicine is only for you. Do not share this medicine with others. What if I miss a dose? This does not apply. What may interact with this medicine? -capecitabine -fluorouracil -phenobarbital -phenytoin -primidone -trimethoprim-sulfamethoxazole This list may not describe all possible interactions. Give your health care provider a list of all the medicines, herbs, non-prescription drugs, or dietary supplements you use. Also tell them if you smoke, drink alcohol, or use illegal drugs. Some items may interact with  your medicine. What should I watch for while using this medicine? Your condition will be monitored carefully while you are receiving this medicine. This medicine may increase the side effects of 5-fluorouracil, 5-FU. Tell your doctor or health care professional if you have diarrhea or mouth sores that do not get better or that get worse. What side effects may I notice from receiving this medicine? Side effects that you should report to your doctor or health care professional as soon as possible: -allergic reactions like skin rash, itching or hives, swelling of the face, lips, or tongue -breathing problems -fever, infection -mouth sores -unusual bleeding or bruising -unusually weak or tired Side effects that usually do not require medical attention (report to your doctor or health care professional if they continue or are bothersome): -constipation or diarrhea -loss of appetite -nausea, vomiting This list may not describe all possible side effects. Call your doctor for medical advice  about side effects. You may report side effects to FDA at 1-800-FDA-1088. Where should I keep my medicine? This drug is given in a hospital or clinic and will not be stored at home. NOTE: This sheet is a summary. It may not cover all possible information. If you have questions about this medicine, talk to your doctor, pharmacist, or health care provider.  2018 Elsevier/Gold Standard (2008-03-22 16:50:29)

## 2017-05-21 ENCOUNTER — Encounter: Payer: Self-pay | Admitting: *Deleted

## 2017-05-21 ENCOUNTER — Encounter
Admission: RE | Disposition: A | Discharge status: Home / Self Care | Payer: Self-pay | Source: Ambulatory Visit | Attending: General Surgery

## 2017-05-21 ENCOUNTER — Ambulatory Visit: Payer: Medicare HMO | Admitting: Registered Nurse

## 2017-05-21 ENCOUNTER — Ambulatory Visit
Admission: RE | Admit: 2017-05-21 | Discharge: 2017-05-21 | Disposition: A | Discharge status: Home / Self Care | Payer: Medicare HMO | Source: Ambulatory Visit | Attending: General Surgery | Admitting: General Surgery

## 2017-05-21 ENCOUNTER — Ambulatory Visit: Payer: Medicare HMO

## 2017-05-21 DIAGNOSIS — Z888 Allergy status to other drugs, medicaments and biological substances status: Secondary | ICD-10-CM | POA: Diagnosis not present

## 2017-05-21 DIAGNOSIS — M1990 Unspecified osteoarthritis, unspecified site: Secondary | ICD-10-CM | POA: Diagnosis not present

## 2017-05-21 DIAGNOSIS — Z9049 Acquired absence of other specified parts of digestive tract: Secondary | ICD-10-CM | POA: Diagnosis not present

## 2017-05-21 DIAGNOSIS — F17210 Nicotine dependence, cigarettes, uncomplicated: Secondary | ICD-10-CM | POA: Diagnosis not present

## 2017-05-21 DIAGNOSIS — M810 Age-related osteoporosis without current pathological fracture: Secondary | ICD-10-CM | POA: Insufficient documentation

## 2017-05-21 DIAGNOSIS — Z86718 Personal history of other venous thrombosis and embolism: Secondary | ICD-10-CM | POA: Insufficient documentation

## 2017-05-21 DIAGNOSIS — E559 Vitamin D deficiency, unspecified: Secondary | ICD-10-CM | POA: Diagnosis not present

## 2017-05-21 DIAGNOSIS — K219 Gastro-esophageal reflux disease without esophagitis: Secondary | ICD-10-CM | POA: Insufficient documentation

## 2017-05-21 DIAGNOSIS — C189 Malignant neoplasm of colon, unspecified: Secondary | ICD-10-CM

## 2017-05-21 DIAGNOSIS — G8929 Other chronic pain: Secondary | ICD-10-CM | POA: Insufficient documentation

## 2017-05-21 DIAGNOSIS — Z452 Encounter for adjustment and management of vascular access device: Secondary | ICD-10-CM | POA: Diagnosis not present

## 2017-05-21 DIAGNOSIS — Z933 Colostomy status: Secondary | ICD-10-CM | POA: Insufficient documentation

## 2017-05-21 DIAGNOSIS — E669 Obesity, unspecified: Secondary | ICD-10-CM | POA: Diagnosis not present

## 2017-05-21 DIAGNOSIS — Z791 Long term (current) use of non-steroidal anti-inflammatories (NSAID): Secondary | ICD-10-CM | POA: Diagnosis not present

## 2017-05-21 DIAGNOSIS — Z79899 Other long term (current) drug therapy: Secondary | ICD-10-CM | POA: Insufficient documentation

## 2017-05-21 DIAGNOSIS — Z95828 Presence of other vascular implants and grafts: Secondary | ICD-10-CM | POA: Diagnosis not present

## 2017-05-21 DIAGNOSIS — Z79891 Long term (current) use of opiate analgesic: Secondary | ICD-10-CM | POA: Insufficient documentation

## 2017-05-21 DIAGNOSIS — E785 Hyperlipidemia, unspecified: Secondary | ICD-10-CM | POA: Diagnosis not present

## 2017-05-21 HISTORY — PX: PORTACATH PLACEMENT: SHX2246

## 2017-05-21 SURGERY — INSERTION, TUNNELED CENTRAL VENOUS DEVICE, WITH PORT
Anesthesia: General | Laterality: Right | Wound class: Clean

## 2017-05-21 MED ORDER — PROPOFOL 10 MG/ML IV BOLUS
INTRAVENOUS | Status: DC | PRN
  Administered 2017-05-21: 20 mg via INTRAVENOUS
  Administered 2017-05-21: 10 mg via INTRAVENOUS

## 2017-05-21 MED ORDER — BUPIVACAINE HCL (PF) 0.25 % IJ SOLN
INTRAMUSCULAR | Status: AC
  Filled 2017-05-21: qty 30

## 2017-05-21 MED ORDER — FENTANYL CITRATE (PF) 100 MCG/2ML IJ SOLN
INTRAMUSCULAR | Status: DC | PRN
  Administered 2017-05-21: 25 ug via INTRAVENOUS
  Administered 2017-05-21 (×2): 12.5 ug via INTRAVENOUS

## 2017-05-21 MED ORDER — FENTANYL CITRATE (PF) 100 MCG/2ML IJ SOLN: 25.0000 ug | INTRAMUSCULAR | Status: DC | PRN

## 2017-05-21 MED ORDER — PROPOFOL 500 MG/50ML IV EMUL
INTRAVENOUS | Status: DC | PRN
  Administered 2017-05-21: 100 ug/kg/min via INTRAVENOUS

## 2017-05-21 MED ORDER — CHLORHEXIDINE GLUCONATE CLOTH 2 % EX PADS: 6.0000 | MEDICATED_PAD | Freq: Once | CUTANEOUS | Status: DC

## 2017-05-21 MED ORDER — SODIUM CHLORIDE 0.9 % IV SOLN
INTRAVENOUS | Status: DC | PRN
  Administered 2017-05-21: 10 mL via INTRAMUSCULAR

## 2017-05-21 MED ORDER — BUPIVACAINE-EPINEPHRINE (PF) 0.25% -1:200000 IJ SOLN
INTRAMUSCULAR | Status: DC | PRN
  Administered 2017-05-21: 10 mL via PERINEURAL

## 2017-05-21 MED ORDER — ACETAMINOPHEN 10 MG/ML IV SOLN
INTRAVENOUS | Status: AC
  Filled 2017-05-21: qty 100

## 2017-05-21 MED ORDER — MIDAZOLAM HCL 2 MG/2ML IJ SOLN
INTRAMUSCULAR | Status: AC
  Filled 2017-05-21: qty 2

## 2017-05-21 MED ORDER — ONDANSETRON HCL 4 MG/2ML IJ SOLN
INTRAMUSCULAR | Status: DC | PRN
  Administered 2017-05-21: 4 mg via INTRAVENOUS

## 2017-05-21 MED ORDER — HEPARIN SODIUM (PORCINE) 5000 UNIT/ML IJ SOLN
INTRAMUSCULAR | Status: AC
  Filled 2017-05-21: qty 1

## 2017-05-21 MED ORDER — LACTATED RINGERS IV SOLN
INTRAVENOUS | Status: DC
  Administered 2017-05-21: 11:00:00 via INTRAVENOUS

## 2017-05-21 MED ORDER — CEFAZOLIN SODIUM-DEXTROSE 2-4 GM/100ML-% IV SOLN
INTRAVENOUS | Status: AC
  Filled 2017-05-21: qty 100

## 2017-05-21 MED ORDER — FENTANYL CITRATE (PF) 100 MCG/2ML IJ SOLN
INTRAMUSCULAR | Status: AC
  Filled 2017-05-21: qty 2

## 2017-05-21 MED ORDER — EPINEPHRINE PF 1 MG/ML IJ SOLN
INTRAMUSCULAR | Status: AC
  Filled 2017-05-21: qty 1

## 2017-05-21 MED ORDER — PROMETHAZINE HCL 25 MG/ML IJ SOLN: 6.2500 mg | INTRAMUSCULAR | Status: DC | PRN

## 2017-05-21 MED ORDER — MIDAZOLAM HCL 2 MG/2ML IJ SOLN
INTRAMUSCULAR | Status: DC | PRN
  Administered 2017-05-21: 2 mg via INTRAVENOUS

## 2017-05-21 MED ORDER — OXYCODONE HCL 5 MG/5ML PO SOLN: 5.0000 mg | Freq: Once | ORAL | Status: DC | PRN

## 2017-05-21 MED ORDER — FAMOTIDINE 20 MG PO TABS
20.0000 mg | ORAL_TABLET | Freq: Once | ORAL | Status: AC
  Administered 2017-05-21: 20 mg via ORAL

## 2017-05-21 MED ORDER — SODIUM CHLORIDE 0.9 % IJ SOLN
INTRAMUSCULAR | Status: AC
  Filled 2017-05-21: qty 50

## 2017-05-21 MED ORDER — PROPOFOL 500 MG/50ML IV EMUL
INTRAVENOUS | Status: AC
  Filled 2017-05-21: qty 50

## 2017-05-21 MED ORDER — MEPERIDINE HCL 50 MG/ML IJ SOLN: 6.2500 mg | INTRAMUSCULAR | Status: DC | PRN

## 2017-05-21 MED ORDER — OXYCODONE HCL 5 MG PO TABS: 5.0000 mg | ORAL_TABLET | Freq: Once | ORAL | Status: DC | PRN

## 2017-05-21 MED ORDER — FAMOTIDINE 20 MG PO TABS
ORAL_TABLET | ORAL | Status: AC
  Filled 2017-05-21: qty 1

## 2017-05-21 SURGICAL SUPPLY — 30 items
ADHESIVE MASTISOL STRL (MISCELLANEOUS) ×3 IMPLANT
CANISTER SUCT 1200ML W/VALVE (MISCELLANEOUS) ×3 IMPLANT
CHLORAPREP W/TINT 26ML (MISCELLANEOUS) ×3 IMPLANT
CLOSURE WOUND 1/2 X4 (GAUZE/BANDAGES/DRESSINGS) ×1
COVER LIGHT HANDLE STERIS (MISCELLANEOUS) ×6 IMPLANT
DRAPE C-ARM XRAY 36X54 (DRAPES) ×3 IMPLANT
DRAPE INCISE 23X17 IOBAN STRL (DRAPES) ×2
DRAPE INCISE IOBAN 23X17 STRL (DRAPES) ×1 IMPLANT
DRSG TEGADERM 2-3/8X2-3/4 SM (GAUZE/BANDAGES/DRESSINGS) ×3 IMPLANT
DRSG TEGADERM 4X4.75 (GAUZE/BANDAGES/DRESSINGS) ×3 IMPLANT
ELECT REM PT RETURN 9FT ADLT (ELECTROSURGICAL) ×3
ELECTRODE REM PT RTRN 9FT ADLT (ELECTROSURGICAL) ×1 IMPLANT
GAUZE SPONGE 4X4 12PLY STRL (GAUZE/BANDAGES/DRESSINGS) ×3 IMPLANT
GLOVE BIO SURGEON STRL SZ7.5 (GLOVE) ×6 IMPLANT
GLOVE INDICATOR 8.0 STRL GRN (GLOVE) ×6 IMPLANT
GOWN STRL REUS W/ TWL LRG LVL3 (GOWN DISPOSABLE) ×2 IMPLANT
GOWN STRL REUS W/TWL LRG LVL3 (GOWN DISPOSABLE) ×4
KIT RM TURNOVER STRD PROC AR (KITS) ×3 IMPLANT
LABEL OR SOLS (LABEL) ×3 IMPLANT
NEEDLE FILTER BLUNT 18X 1/2SAF (NEEDLE) ×2
NEEDLE FILTER BLUNT 18X1 1/2 (NEEDLE) ×1 IMPLANT
PACK PORT-A-CATH (MISCELLANEOUS) ×3 IMPLANT
PORT POWER 8FR ISP MICROINTR (INTRODUCER) ×3 IMPLANT
STRIP CLOSURE SKIN 1/2X4 (GAUZE/BANDAGES/DRESSINGS) ×2 IMPLANT
SUT MNCRL AB 4-0 PS2 18 (SUTURE) ×3 IMPLANT
SUT PROLENE 3 0 SH DA (SUTURE) ×3 IMPLANT
SUT VIC AB 3-0 SH 27 (SUTURE) ×2
SUT VIC AB 3-0 SH 27X BRD (SUTURE) ×1 IMPLANT
SYR 3ML LL SCALE MARK (SYRINGE) ×3 IMPLANT
power port clear vue isp ×3 IMPLANT

## 2017-05-21 NOTE — Addendum Note (Signed)
Addendum  created 05/21/17 1427 by Hedda Slade, CRNA   Anesthesia Intra Meds edited

## 2017-05-21 NOTE — Anesthesia Preprocedure Evaluation (Signed)
Anesthesia Evaluation  Patient identified by MRN, date of birth, ID band Patient awake    Reviewed: Allergy & Precautions, NPO status , Patient's Chart, lab work & pertinent test results  History of Anesthesia Complications Negative for: history of anesthetic complications  Airway Mallampati: III  TM Distance: >3 FB Neck ROM: Full    Dental  (+) Poor Dentition   Pulmonary neg sleep apnea, neg COPD, Current Smoker,    breath sounds clear to auscultation- rhonchi (-) wheezing      Cardiovascular Exercise Tolerance: Good (-) hypertension(-) angina+ Peripheral Vascular Disease  (-) Past MI, (-) Cardiac Stents and (-) CABG  Rhythm:Regular Rate:Normal - Systolic murmurs and - Diastolic murmurs    Neuro/Psych negative neurological ROS  negative psych ROS   GI/Hepatic Neg liver ROS, GERD  ,  Endo/Other  negative endocrine ROSneg diabetes  Renal/GU negative Renal ROS     Musculoskeletal  (+) Arthritis ,   Abdominal (+) - obese,   Peds  Hematology negative hematology ROS (+)   Anesthesia Other Findings Past Medical History: No date: Breast cancer (HCC) No date: Breast cancer, left (HCC)     Comment:  Lumpectomy and rad tx's. No date: Chronic back pain No date: Chronic knee pain No date: Colon cancer ([place]) 04/2013: Degenerative disc disease, lumbar No date: Dyspnea No date: Family history of adverse reaction to anesthesia     Comment:  son arrested after anesthesia No date: GERD (gastroesophageal reflux disease) No date: Hyperlipidemia No date: Neuropathy No date: Osteoarthritis     Comment:  of knee No date: Osteoporosis No date: Tobacco abuse No date: Vitamin D deficiency   Reproductive/Obstetrics                             Anesthesia Physical Anesthesia Plan  ASA: III  Anesthesia Plan: General   Post-op Pain Management:    Induction: Intravenous  PONV Risk Score and Plan: 1  and Ondansetron and Propofol infusion  Airway Management Planned: Natural Airway  Additional Equipment:   Intra-op Plan:   Post-operative Plan:   Informed Consent: I have reviewed the patients History and Physical, chart, labs and discussed the procedure including the risks, benefits and alternatives for the proposed anesthesia with the patient or authorized representative who has indicated his/her understanding and acceptance.   Dental advisory given  Plan Discussed with: CRNA and Anesthesiologist  Anesthesia Plan Comments:         Anesthesia Quick Evaluation

## 2017-05-21 NOTE — Op Note (Addendum)
   Pre-operative Diagnosis: Colon cancer  Post-operative Diagnosis: Same  Procedure performed: Right subclavian Port-A-Cath placement  Surgeon: Clayburn Pert   Assistants: None  Anesthesia: Monitored Local Anesthesia with Sedation  ASA Class: 2  Surgeon: Clayburn Pert, MD FACS  Anesthesia: Gen. with endotracheal tube  Assistant: None  Procedure Details  The patient was seen again in the Holding Room. The benefits, complications, treatment options, and expected outcomes were discussed with the patient. The risks of bleeding, infection, pneumothorax, vascular injury, any of which could require further surgery were reviewed with the patient.   The patient was taken to Operating Room, identified as Kimberly [name] and the procedure verified.  A Time Out was held and the above information confirmed.  Prior to the induction of general anesthesia, antibiotic prophylaxis was administered. VTE prophylaxis was in place. Monitored anesthesia was then administered and tolerated well. After the induction, the chest and neck was prepped with Chloraprep and draped in the sterile fashion. The patient was positioned in the supine position.  The right upper chest was localized with a 1% lidocaine 0.5% Marcaine solution. The patient was placed head down. The right subclavian vein was accessed with the micropuncture kit on the first attempt. Micropuncture wire was then placed and confirmed in the appropriate location under fluoroscopy. The J-wire sheath was placed over the micropuncture wire. The wire was removed without difficulty. Standard J-wire was then placed through the sheath and again confirmed and purple location under fluoroscopy. The skin was incised at the wire and the insertion dilator cannula was placed over the J-wire. The J-wire was removed and the Chemo-Port catheter was placed into the patient's vein and confirmed in the proper location under fluoroscopy. A pocket was localized with the  aforementioned local anesthetic just caudad to the insertion site and the skin was incised with a 15 blade scalpel. A pocket was created with electrocautery the appropriate size to accept the Chemo-Port. Using the tunneler device the catheter was tunneled under the skin to the insertion port area the catheter was confirmed in the purple location one more time under fluoroscopy prior to being cut and placed onto the port secured with the locking clamp over the catheter. The port was placed into the pocket and secured to the pectoral fascia with 2-0 Prolene suture. The pocket was closed in layers with an interrupted 3-0 Vicryl suture and then a running 4-0 Monocryl suture. The access site was also closed with a simple interrupted 4-0 Monocryl suture. The skin sites were both sides were cleaned and dried and dressed with Mastisol, Steri-Strips, 2 x 2's, Tegaderm.  The patient tolerated the procedure well. All counts were correct at the procedure. There were no complications.  Findings: Right subclavian Chemo-Port   Estimated Blood Loss: 5 mg         Drains: None         Specimens: None          Complications: None                  Condition: Good   Clayburn Pert, MD, FACS

## 2017-05-21 NOTE — Anesthesia Procedure Notes (Signed)
Date/Time: 05/21/2017 12:20 PM Performed by: Hedda Slade Pre-anesthesia Checklist: Patient identified, Emergency Drugs available, Suction available and Patient being monitored Patient Re-evaluated:Patient Re-evaluated prior to induction Oxygen Delivery Method: Simple face mask Preoxygenation: Pre-oxygenation with 100% oxygen

## 2017-05-21 NOTE — Transfer of Care (Signed)
Immediate Anesthesia Transfer of Care Note  Patient: Kimberly [name]  Procedure(s) Performed: Procedure(s): INSERTION PORT-A-CATH (Right)  Patient Location: PACU  Anesthesia Type:General  Level of Consciousness: awake, alert  and oriented  Airway & Oxygen Therapy: Patient Spontanous Breathing and Patient connected to face mask oxygen  Post-op Assessment: Report given to RN and Post -op Vital signs reviewed and stable  Post vital signs: Reviewed and stable  Last Vitals:  Vitals:   05/21/17 1050 05/21/17 1312  BP: (!) 155/87 127/66  Pulse: 93 83  Resp: 18 15  Temp: 36.8 C 36.4 C  SpO2: 97% 94%    Last Pain:  Vitals:   05/21/17 1050  TempSrc: Tympanic  PainSc: 0-No pain         Complications: No apparent anesthesia complications

## 2017-05-21 NOTE — Brief Op Note (Signed)
05/21/2017  1:07 PM  PATIENT:  Kimberly [name]  71 y.o. female  PRE-OPERATIVE DIAGNOSIS:  colon cancer  POST-OPERATIVE DIAGNOSIS:  colon cancer  PROCEDURE:  Procedure(s): INSERTION PORT-A-CATH (Right)  SURGEON:  Surgeon(s) and Role:    * Clayburn Pert, MD - Primary  PHYSICIAN ASSISTANT:   ASSISTANTS: none   ANESTHESIA:   MAC  EBL:  Total I/O In: -  Out: 2 [Blood:2]  BLOOD ADMINISTERED:none  DRAINS: none   LOCAL MEDICATIONS USED:  MARCAINE   , XYLOCAINE  and Amount: 10 ml  SPECIMEN:  No Specimen  DISPOSITION OF SPECIMEN:  N/A  COUNTS:  YES  TOURNIQUET:  * No tourniquets in log *  DICTATION: .Dragon Dictation  PLAN OF CARE: Discharge to home after PACU  PATIENT DISPOSITION:  PACU - hemodynamically stable.   Delay start of Pharmacological VTE agent (>24hrs) due to surgical blood loss or risk of bleeding: not applicable

## 2017-05-21 NOTE — Interval H&P Note (Signed)
History and Physical Interval Note:  05/21/2017 11:56 AM  Kimberly [name]  has presented today for surgery, with the diagnosis of colon cancer  The various methods of treatment have been discussed with the patient and family. After consideration of risks, benefits and other options for treatment, the patient has consented to  Procedure(s): INSERTION PORT-A-CATH (N/A) as a surgical intervention .  The patient's history has been reviewed, patient examined, no change in status, stable for surgery.  I have reviewed the patient's chart and labs.  Questions were answered to the patient's satisfaction.     Clayburn Pert

## 2017-05-21 NOTE — Anesthesia Post-op Follow-up Note (Signed)
Anesthesia QCDR form completed.        

## 2017-05-21 NOTE — Anesthesia Postprocedure Evaluation (Signed)
Anesthesia Post Note  Patient: Kimberly [name]  Procedure(s) Performed: Procedure(s) (LRB): INSERTION PORT-A-CATH (Right)  Patient location during evaluation: PACU Anesthesia Type: General Level of consciousness: awake and alert and oriented Pain management: pain level controlled Vital Signs Assessment: post-procedure vital signs reviewed and stable Respiratory status: spontaneous breathing, nonlabored ventilation and respiratory function stable Cardiovascular status: blood pressure returned to baseline and stable Postop Assessment: no signs of nausea or vomiting Anesthetic complications: no     Last Vitals:  Vitals:   05/21/17 1340 05/21/17 1342  BP:  (!) 146/79  Pulse:  75  Resp: 16 16  Temp:    SpO2:  97%    Last Pain:  Vitals:   05/21/17 1050  TempSrc: Tympanic  PainSc: 0-No pain                 Nickola Lenig

## 2017-05-21 NOTE — Discharge Instructions (Signed)
AMBULATORY SURGERY  DISCHARGE INSTRUCTIONS   1) The drugs that you were given will stay in your system until tomorrow so for the next 24 hours you should not:  A) Drive an automobile B) Make any legal decisions C) Drink any alcoholic beverage   2) You may resume regular meals tomorrow.  Today it is better to start with liquids and gradually work up to solid foods.  You may eat anything you prefer, but it is better to start with liquids, then soup and crackers, and gradually work up to solid foods.   3) Please notify your doctor immediately if you have any unusual bleeding, trouble breathing, redness and pain at the surgery site, drainage, fever, or pain not relieved by medication. 4)   5) Your post-operative visit with Dr.                                     is: Date:                        Time:    Please call to schedule your post-operative visit.  6) Additional Instructions:      Tunneled Catheter Insertion, Care After Refer to this sheet in the next few weeks. These instructions provide you with information about caring for yourself after your procedure. Your health care provider may also give you more specific instructions. Your treatment has been planned according to current medical practices, but problems sometimes occur. Call your health care provider if you have any problems or questions after your procedure. What can I expect after the procedure? After the procedure, it is common to have:  Some mild redness, swelling, and pain around your catheter site.  A small amount of blood or clear fluid coming from your incisions.  Follow these instructions at home: Incision care  Check your incision areas every day for signs of infection. Check for: ? More redness, swelling, or pain. ? More fluid or blood. ? Warmth. ? Pus or a bad smell.  Follow instructions from your health care provider about how to take care of your incisions. Make sure you: ? Wash your hands with  soap and water before you change your bandages (dressings). If soap and water are not available, use hand sanitizer. ? Change your dressings as told by your health care provider. Wash the area around your incisions with a germ-killing (antiseptic) solution when you change your dressing, as told by your health care provider. ? Leave stitches (sutures), skin glue, or adhesive strips in place. These skin closures may need to stay in place for 2 weeks or longer. If adhesive strip edges start to loosen and curl up, you may trim the loose edges. Do not remove adhesive strips completely unless your health care provider tells you to do that. Catheter Care   Wash your hands with soap and water before and after caring for your catheter. If soap and water are not available, use hand sanitizer.  Keep your catheter site and your dressings clean and dry.  Apply an antibiotic ointment to your catheter site as told by your health care provider.  Flush your catheter as told by your health care provider. This helps prevent it from becoming clogged.  Do not open the caps on the ends of the catheter.  Do not pull on your catheter.  If your catheter is in your arm: ? Avoid  wearing tight clothes or tight jewelry on your arm that has the catheter. ? Do not sleep with your head on the arm that has the catheter. ? Do not allow your blood pressure to be taken on the arm that has the catheter. ? Do not allow your blood to be drawn from the arm that has the catheter, except through the catheter itself. Medicines  Take over-the-counter and prescription medicines only as told by your health care provider.  If you were prescribed an antibiotic medicine, take it as told by your health care provider. Do not stop taking the antibiotic even if you start to feel better. Activity  Return to your normal activities as told by your health care provider. Ask your health care provider what activities are safe for you.  Do not  lift anything that is heavier than 10 lb (4.5 kg) for 3 weeks or as long as told by your health care provider. Driving  Do not drive until your health care provider approves.  Do not drive or operate heavy machinery while taking prescription pain medicine. General instructions  Follow your health care provider's specific instructions for the type of catheter that you have.  Do not take baths, swim, or use a hot tub until your health care provider approves.  Follow instructions from your health care provider about eating or drinking restrictions.  Wear compression stockings as told by your health care provider. These stockings help to prevent blood clots and reduce swelling in your legs.  Keep all follow-up visits as told by your health care provider. This is important. Contact a health care provider if:  You have more fluid or blood coming from your incisions.  You have more redness, swelling, or pain at your incisions or around the area where your catheter is inserted.  Your incisions feel warm to the touch.  You feel unusually weak.  You feel nauseous.  Your catheter is not working properly.  You have blood or fluid draining from your catheter.  You are unable to flush your catheter. Get help right away if:  Your catheter breaks.  A hole develops in your catheter.  Your catheter comes loose or gets pulled completely out. If this happens, press on your catheter site firmly with your hand or a clean cloth until you get medical help.  Your catheter becomes blocked.  You have swelling in your arm, shoulder, neck, or face.  You develop chest pain.  You have difficulty breathing.  You feel dizzy or light-headed.  You have pus or a bad smell coming from your incisions.  You have a fever.  You develop bleeding from your catheter or your insertion site, and your bleeding does not stop. This information is not intended to replace advice given to you by your health  care provider. Make sure you discuss any questions you have with your health care provider. Document Released: 09/02/2012 Document Revised: 05/19/2016 Document Reviewed: 06/12/2015 Elsevier Interactive Patient Education  [employer].

## 2017-05-21 NOTE — H&P (View-Only) (Signed)
Outpatient Surgical Follow Up  05/15/2017  Kimberly [name] is an 71 y.o. female.   Chief Complaint  Patient presents with  . Routine Post Op    Exploratory Laparotomy with Colostomy and Mucous Fistula creation (04/15/17)- Dr. Adonis Huguenin    HPI: Kimberly [name] is a 71 y.o. female  s/p transverse colectomy with end colostomy and mucous fistula creation with Dr. Adonis Huguenin for obst colon ca.  UTI resolved, peroneal DVT resolved. She is doing well and her ostomy is working. Taking PO, no fevers or chills. Small abd wound continues to improve. No evidence of active infection. She is in need for a port  Past Medical History:  Diagnosis Date  . Breast cancer ([place])   . Breast cancer, left ([facility])    Lumpectomy and rad tx's.  . Chronic back pain   . Chronic knee pain   . Colon cancer ([place])   . Degenerative disc disease, lumbar 04/2013  . Hyperlipidemia   . Neuropathy   . Osteoarthritis    of knee  . Osteoporosis   . Tobacco abuse   . Vitamin D deficiency     Past Surgical History:  Procedure Laterality Date  . APPENDECTOMY  1965  . BREAST EXCISIONAL BIOPSY Left 08/01/2003   lumpectomy rad 11/04-2/28/2005  . CESAREAN SECTION     x3  . COLONOSCOPY W/ POLYPECTOMY    . ESOPHAGOGASTRODUODENOSCOPY (EGD) WITH PROPOFOL N/A 04/04/2017   Procedure: ESOPHAGOGASTRODUODENOSCOPY (EGD) WITH PROPOFOL;  Surgeon: [name], MD;  Location: [facility];  Service: Endoscopy;  Laterality: N/A;  . HIP FRACTURE SURGERY Left 01/25/2012  . LAPAROTOMY N/A 04/15/2017   Procedure: EXPLORATORY LAPAROTOMY;  Surgeon: Clayburn Pert, MD;  Location: [facility];  Service: General;  Laterality: N/A;  . NECK SURGERY  12/2011  . WRIST FRACTURE SURGERY      Family History  Problem Relation Age of Onset  . Diabetes Mother        type 2  . Coronary artery disease Mother   . Deep vein thrombosis Mother   . Colon cancer Father   . Asthma Brother   . Diabetes Brother        type 2    Social History:  reports  that she has been smoking Cigarettes.  She has a 27.50 pack-year smoking history. She has never used smokeless tobacco. She reports that she drinks alcohol. She reports that she does not use drugs.  Allergies:  Allergies  Allergen Reactions  . Sinus Formula [Cholestatin] Nausea Only  . Iodine Other (See Comments)    Topical iodine she states "if you put it in an open sore it will cause a eating ulcer."    Medications reviewed.    ROS Full ROS performed and is otherwise negative other than what is stated in HPI   BP (!) 159/89   Pulse 82   Temp 97.8 F (36.6 C) (Oral)   Wt 66.7 kg (147 lb)   BMI 28.71 kg/m    Physical Exam  Constitutional: She is oriented to person, place, and time and well-developed, well-nourished, and in no distress.  Neck: No JVD present. No tracheal deviation present.  Pulmonary/Chest: Effort normal. No stridor. No respiratory distress. She exhibits no tenderness.  Abdominal: Soft. She exhibits no distension. There is no tenderness. There is no rebound and no guarding.  Colostomy working. Small midline wound 2cm in length, good granulation tissue, no infection. MF on the left. No peritonitis or infection  Neurological: She is alert and oriented  to person, place, and time. Gait normal. GCS score is 15.  Skin: Skin is warm and dry.  Psychiatric: Mood, memory, affect and judgment normal.  Nursing note and vitals reviewed.  Assessment/Plan: Doing well from abd perspective. She will need port since chemo will be started in 2 weeks. Does with the patient in detail about the procedure. Risks benefits and possible complications including but not limited to: Bleeding, infection and pneumothorax, vascular injury. She understands and wishes to proceed. We'll schedule her poor for Dr. Adonis Huguenin next week, all questions were answered   [name] Hamman, MD [facility]

## 2017-05-22 ENCOUNTER — Encounter: Payer: Self-pay | Admitting: General Surgery

## 2017-05-22 ENCOUNTER — Inpatient Hospital Stay: Payer: Medicare HMO | Attending: Oncology

## 2017-05-22 DIAGNOSIS — M5136 Other intervertebral disc degeneration, lumbar region: Secondary | ICD-10-CM | POA: Insufficient documentation

## 2017-05-22 DIAGNOSIS — C184 Malignant neoplasm of transverse colon: Secondary | ICD-10-CM | POA: Insufficient documentation

## 2017-05-22 DIAGNOSIS — Z923 Personal history of irradiation: Secondary | ICD-10-CM | POA: Insufficient documentation

## 2017-05-22 DIAGNOSIS — M549 Dorsalgia, unspecified: Secondary | ICD-10-CM | POA: Insufficient documentation

## 2017-05-22 DIAGNOSIS — E876 Hypokalemia: Secondary | ICD-10-CM | POA: Insufficient documentation

## 2017-05-22 DIAGNOSIS — D500 Iron deficiency anemia secondary to blood loss (chronic): Secondary | ICD-10-CM | POA: Insufficient documentation

## 2017-05-22 DIAGNOSIS — C779 Secondary and unspecified malignant neoplasm of lymph node, unspecified: Secondary | ICD-10-CM | POA: Insufficient documentation

## 2017-05-22 DIAGNOSIS — R0602 Shortness of breath: Secondary | ICD-10-CM | POA: Insufficient documentation

## 2017-05-22 DIAGNOSIS — I700 Atherosclerosis of aorta: Secondary | ICD-10-CM | POA: Insufficient documentation

## 2017-05-22 DIAGNOSIS — K219 Gastro-esophageal reflux disease without esophagitis: Secondary | ICD-10-CM | POA: Insufficient documentation

## 2017-05-22 DIAGNOSIS — Z853 Personal history of malignant neoplasm of breast: Secondary | ICD-10-CM | POA: Insufficient documentation

## 2017-05-22 DIAGNOSIS — Z933 Colostomy status: Secondary | ICD-10-CM | POA: Insufficient documentation

## 2017-05-22 DIAGNOSIS — F17210 Nicotine dependence, cigarettes, uncomplicated: Secondary | ICD-10-CM | POA: Insufficient documentation

## 2017-05-22 DIAGNOSIS — Z800 Family history of malignant neoplasm of digestive organs: Secondary | ICD-10-CM | POA: Insufficient documentation

## 2017-05-22 DIAGNOSIS — E785 Hyperlipidemia, unspecified: Secondary | ICD-10-CM | POA: Insufficient documentation

## 2017-05-22 DIAGNOSIS — D559 Anemia due to enzyme disorder, unspecified: Secondary | ICD-10-CM | POA: Insufficient documentation

## 2017-05-22 DIAGNOSIS — Z79899 Other long term (current) drug therapy: Secondary | ICD-10-CM | POA: Insufficient documentation

## 2017-05-22 DIAGNOSIS — M818 Other osteoporosis without current pathological fracture: Secondary | ICD-10-CM | POA: Insufficient documentation

## 2017-05-22 DIAGNOSIS — M1990 Unspecified osteoarthritis, unspecified site: Secondary | ICD-10-CM | POA: Insufficient documentation

## 2017-05-22 MED ORDER — ONDANSETRON HCL 8 MG PO TABS: 8.0000 mg | ORAL_TABLET | Freq: Two times a day (BID) | ORAL | 1 refills | Status: DC | PRN

## 2017-05-22 MED ORDER — LIDOCAINE-PRILOCAINE 2.5-2.5 % EX CREA: TOPICAL_CREAM | CUTANEOUS | 3 refills | Status: DC

## 2017-05-22 MED ORDER — PROCHLORPERAZINE MALEATE 10 MG PO TABS: 10.0000 mg | ORAL_TABLET | Freq: Four times a day (QID) | ORAL | 1 refills | Status: DC | PRN

## 2017-05-23 DIAGNOSIS — E785 Hyperlipidemia, unspecified: Secondary | ICD-10-CM | POA: Diagnosis not present

## 2017-05-23 DIAGNOSIS — M5136 Other intervertebral disc degeneration, lumbar region: Secondary | ICD-10-CM | POA: Diagnosis not present

## 2017-05-23 DIAGNOSIS — F17210 Nicotine dependence, cigarettes, uncomplicated: Secondary | ICD-10-CM | POA: Diagnosis not present

## 2017-05-23 DIAGNOSIS — G629 Polyneuropathy, unspecified: Secondary | ICD-10-CM | POA: Diagnosis not present

## 2017-05-23 DIAGNOSIS — Z433 Encounter for attention to colostomy: Secondary | ICD-10-CM | POA: Diagnosis not present

## 2017-05-23 DIAGNOSIS — M179 Osteoarthritis of knee, unspecified: Secondary | ICD-10-CM | POA: Diagnosis not present

## 2017-05-23 DIAGNOSIS — G8929 Other chronic pain: Secondary | ICD-10-CM | POA: Diagnosis not present

## 2017-05-23 DIAGNOSIS — Z483 Aftercare following surgery for neoplasm: Secondary | ICD-10-CM | POA: Diagnosis not present

## 2017-05-23 DIAGNOSIS — C189 Malignant neoplasm of colon, unspecified: Secondary | ICD-10-CM | POA: Diagnosis not present

## 2017-05-26 ENCOUNTER — Other Ambulatory Visit: Payer: Self-pay | Admitting: Oncology

## 2017-05-27 ENCOUNTER — Encounter: Payer: Self-pay | Admitting: Oncology

## 2017-05-27 ENCOUNTER — Other Ambulatory Visit: Payer: Self-pay | Admitting: *Deleted

## 2017-05-27 ENCOUNTER — Inpatient Hospital Stay: Payer: Medicare HMO

## 2017-05-27 ENCOUNTER — Inpatient Hospital Stay (HOSPITAL_BASED_OUTPATIENT_CLINIC_OR_DEPARTMENT_OTHER): Payer: Medicare HMO | Admitting: Oncology

## 2017-05-27 VITALS — BP 131/78 | HR 78 | Temp 98.5°F | Resp 16 | Wt 146.2 lb

## 2017-05-27 DIAGNOSIS — D500 Iron deficiency anemia secondary to blood loss (chronic): Secondary | ICD-10-CM | POA: Diagnosis not present

## 2017-05-27 DIAGNOSIS — C184 Malignant neoplasm of transverse colon: Secondary | ICD-10-CM | POA: Diagnosis not present

## 2017-05-27 DIAGNOSIS — Z853 Personal history of malignant neoplasm of breast: Secondary | ICD-10-CM

## 2017-05-27 DIAGNOSIS — I700 Atherosclerosis of aorta: Secondary | ICD-10-CM

## 2017-05-27 DIAGNOSIS — M1990 Unspecified osteoarthritis, unspecified site: Secondary | ICD-10-CM

## 2017-05-27 DIAGNOSIS — M5136 Other intervertebral disc degeneration, lumbar region: Secondary | ICD-10-CM | POA: Diagnosis not present

## 2017-05-27 DIAGNOSIS — E876 Hypokalemia: Secondary | ICD-10-CM | POA: Diagnosis not present

## 2017-05-27 DIAGNOSIS — Z923 Personal history of irradiation: Secondary | ICD-10-CM

## 2017-05-27 DIAGNOSIS — R0602 Shortness of breath: Secondary | ICD-10-CM

## 2017-05-27 DIAGNOSIS — D559 Anemia due to enzyme disorder, unspecified: Secondary | ICD-10-CM | POA: Diagnosis not present

## 2017-05-27 DIAGNOSIS — R634 Abnormal weight loss: Secondary | ICD-10-CM

## 2017-05-27 DIAGNOSIS — M818 Other osteoporosis without current pathological fracture: Secondary | ICD-10-CM

## 2017-05-27 DIAGNOSIS — K219 Gastro-esophageal reflux disease without esophagitis: Secondary | ICD-10-CM

## 2017-05-27 DIAGNOSIS — F17210 Nicotine dependence, cigarettes, uncomplicated: Secondary | ICD-10-CM | POA: Diagnosis not present

## 2017-05-27 DIAGNOSIS — E785 Hyperlipidemia, unspecified: Secondary | ICD-10-CM

## 2017-05-27 DIAGNOSIS — C779 Secondary and unspecified malignant neoplasm of lymph node, unspecified: Secondary | ICD-10-CM | POA: Diagnosis not present

## 2017-05-27 DIAGNOSIS — Z800 Family history of malignant neoplasm of digestive organs: Secondary | ICD-10-CM

## 2017-05-27 DIAGNOSIS — D509 Iron deficiency anemia, unspecified: Secondary | ICD-10-CM

## 2017-05-27 DIAGNOSIS — Z79899 Other long term (current) drug therapy: Secondary | ICD-10-CM

## 2017-05-27 DIAGNOSIS — Z933 Colostomy status: Secondary | ICD-10-CM

## 2017-05-27 DIAGNOSIS — M549 Dorsalgia, unspecified: Secondary | ICD-10-CM

## 2017-05-27 DIAGNOSIS — Z5111 Encounter for antineoplastic chemotherapy: Secondary | ICD-10-CM

## 2017-05-27 DIAGNOSIS — D649 Anemia, unspecified: Secondary | ICD-10-CM

## 2017-05-27 HISTORY — DX: Iron deficiency anemia, unspecified: D50.9

## 2017-05-27 LAB — COMPREHENSIVE METABOLIC PANEL
ALBUMIN: 3.1 g/dL — AB (ref 3.5–5.0)
ALK PHOS: 61 U/L (ref 38–126)
ALT: 10 U/L — ABNORMAL LOW (ref 14–54)
AST: 16 U/L (ref 15–41)
Anion gap: 10 (ref 5–15)
BILIRUBIN TOTAL: 0.4 mg/dL (ref 0.3–1.2)
BUN: 10 mg/dL (ref 6–20)
CO2: 25 mmol/L (ref 22–32)
CREATININE: 0.56 mg/dL (ref 0.44–1.00)
Calcium: 8.6 mg/dL — ABNORMAL LOW (ref 8.9–10.3)
Chloride: 101 mmol/L (ref 101–111)
GFR calc Af Amer: >60 mL/min (ref >60)
Glucose, Bld: 84 mg/dL (ref 65–99)
POTASSIUM: 3 mmol/L — AB (ref 3.5–5.1)
SODIUM: 136 mmol/L (ref 135–145)
TOTAL PROTEIN: 7.1 g/dL (ref 6.5–8.1)

## 2017-05-27 LAB — CBC WITH DIFFERENTIAL/PLATELET
BASOS PCT: 1 %
Basophils Absolute: 0.2 10*3/uL — ABNORMAL HIGH (ref 0–0.1)
EOS ABS: 0.4 10*3/uL (ref 0–0.7)
EOS PCT: 4 %
HCT: 31.8 % — ABNORMAL LOW (ref 35.0–47.0)
HEMOGLOBIN: 11 g/dL — AB (ref 12.0–16.0)
Lymphocytes Relative: 21 %
Lymphs Abs: 2.5 10*3/uL (ref 1.0–3.6)
MCH: 30.1 pg (ref 26.0–34.0)
MCHC: 34.5 g/dL (ref 32.0–36.0)
MCV: 87.1 fL (ref 80.0–100.0)
MONOS PCT: 13 %
Monocytes Absolute: 1.6 10*3/uL — ABNORMAL HIGH (ref 0.2–0.9)
NEUTROS PCT: 61 %
Neutro Abs: 7.4 10*3/uL — ABNORMAL HIGH (ref 1.4–6.5)
PLATELETS: 575 10*3/uL — AB (ref 150–440)
RBC: 3.65 MIL/uL — AB (ref 3.80–5.20)
RDW: 18.4 % — ABNORMAL HIGH (ref 11.5–14.5)
WBC: 12.1 10*3/uL — AB (ref 3.6–11.0)

## 2017-05-27 LAB — IRON AND TIBC
Iron: 38 ug/dL (ref 28–170)
SATURATION RATIOS: 12 % (ref 10.4–31.8)
TIBC: 328 ug/dL (ref 250–450)
UIBC: 290 ug/dL

## 2017-05-27 LAB — FERRITIN: FERRITIN: 11 ng/mL (ref 11–307)

## 2017-05-27 LAB — MAGNESIUM: MAGNESIUM: 1.1 mg/dL — AB (ref 1.7–2.4)

## 2017-05-27 MED ORDER — FLUOROURACIL CHEMO INJECTION 2.5 GM/50ML
400.0000 mg/m2 | Freq: Once | INTRAVENOUS | Status: AC
  Administered 2017-05-27: 700 mg via INTRAVENOUS
  Filled 2017-05-27: qty 14

## 2017-05-27 MED ORDER — OXALIPLATIN CHEMO INJECTION 100 MG/20ML
85.0000 mg/m2 | Freq: Once | INTRAVENOUS | Status: AC
  Administered 2017-05-27: 150 mg via INTRAVENOUS
  Filled 2017-05-27: qty 10

## 2017-05-27 MED ORDER — SODIUM CHLORIDE 0.9 % IV SOLN: 2.0000 g | Freq: Once | INTRAVENOUS | Status: DC

## 2017-05-27 MED ORDER — ONDANSETRON HCL 4 MG PO TABS
4.0000 mg | ORAL_TABLET | Freq: Once | ORAL | Status: AC
  Administered 2017-05-27: 4 mg via ORAL
  Filled 2017-05-27: qty 1

## 2017-05-27 MED ORDER — LIDOCAINE-PRILOCAINE 2.5-2.5 % EX CREA: TOPICAL_CREAM | CUTANEOUS | 3 refills | Status: DC

## 2017-05-27 MED ORDER — SODIUM CHLORIDE 0.9 % IV SOLN
Freq: Once | INTRAVENOUS | Status: AC
  Administered 2017-05-27: 10:00:00 via INTRAVENOUS
  Filled 2017-05-27: qty 250

## 2017-05-27 MED ORDER — DEXAMETHASONE SODIUM PHOSPHATE 10 MG/ML IJ SOLN
10.0000 mg | Freq: Once | INTRAMUSCULAR | Status: AC
  Administered 2017-05-27: 10 mg via INTRAVENOUS
  Filled 2017-05-27: qty 1

## 2017-05-27 MED ORDER — DEXTROSE 5 % IV SOLN
Freq: Once | INTRAVENOUS | Status: AC
  Administered 2017-05-27: 12:00:00 via INTRAVENOUS
  Filled 2017-05-27: qty 1000

## 2017-05-27 MED ORDER — DEXTROSE 5 % IV SOLN
700.0000 mg | Freq: Once | INTRAVENOUS | Status: AC
  Administered 2017-05-27: 700 mg via INTRAVENOUS
  Filled 2017-05-27: qty 35

## 2017-05-27 MED ORDER — SODIUM CHLORIDE 0.9 % IV SOLN
2400.0000 mg/m2 | INTRAVENOUS | Status: DC
  Administered 2017-05-27: 4200 mg via INTRAVENOUS
  Filled 2017-05-27: qty 84

## 2017-05-27 MED ORDER — MAGNESIUM SULFATE 4 GM/100ML IV SOLN
4.0000 g | Freq: Once | INTRAVENOUS | Status: AC
  Administered 2017-05-27: 4 g via INTRAVENOUS
  Filled 2017-05-27: qty 100

## 2017-05-27 MED ORDER — POTASSIUM CHLORIDE 2 MEQ/ML IV SOLN: 40.0000 meq | Freq: Once | INTRAVENOUS | Status: DC

## 2017-05-27 MED ORDER — MAGNESIUM CHLORIDE 64 MG PO TBEC
1.0000 | DELAYED_RELEASE_TABLET | Freq: Every day | ORAL | Status: DC
  Filled 2017-05-27: qty 1

## 2017-05-27 MED ORDER — MAGNESIUM CHLORIDE 64 MG PO TBEC
1.0000 | DELAYED_RELEASE_TABLET | Freq: Once | ORAL | Status: AC
  Administered 2017-05-27: 64 mg via ORAL
  Filled 2017-05-27: qty 1

## 2017-05-27 MED ORDER — SODIUM CHLORIDE 0.9 % IV SOLN: 10.0000 mg | Freq: Once | INTRAVENOUS | Status: DC

## 2017-05-27 MED ORDER — LORAZEPAM 0.5 MG PO TABS: 0.5000 mg | ORAL_TABLET | Freq: Four times a day (QID) | ORAL | 0 refills | Status: DC | PRN

## 2017-05-27 MED ORDER — PALONOSETRON HCL INJECTION 0.25 MG/5ML
0.2500 mg | Freq: Once | INTRAVENOUS | Status: AC
  Administered 2017-05-27: 0.25 mg via INTRAVENOUS
  Filled 2017-05-27: qty 5

## 2017-05-27 MED ORDER — POTASSIUM CHLORIDE CRYS ER 20 MEQ PO TBCR: 20.0000 meq | EXTENDED_RELEASE_TABLET | Freq: Every day | ORAL | 0 refills | Status: DC

## 2017-05-27 MED ORDER — PROCHLORPERAZINE MALEATE 10 MG PO TABS: 10.0000 mg | ORAL_TABLET | Freq: Four times a day (QID) | ORAL | 1 refills | Status: DC | PRN

## 2017-05-27 MED ORDER — ONDANSETRON HCL 8 MG PO TABS: 8.0000 mg | ORAL_TABLET | Freq: Two times a day (BID) | ORAL | 1 refills | Status: DC | PRN

## 2017-05-27 NOTE — Progress Notes (Signed)
[facility] Initial Visit:  Patient Care Team: Birdie Sons, MD as PCP - General (Family Medicine)  CHIEF COMPLAINTS/PURPOSE OF CONSULTATION: I have colon cancer  HISTORY OF PRESENTING ILLNESS: Kimberly [name] 71 y.o. female with PMH listed as below is here for evaluation of colon cancer and management.  Patient reports a remote history of breast cancer s/p lumpectomy and radiation treatments. Patient had been having abdominal pain, weight loss and bloating.  CT scan showed partial obstruction at the level of transverse colon and ileocolic mesenteric adenopathy. She was prepared for a colonoscopy on 04/15/2017. She started to have worsened abdominal pain, nausea vomiting, unable to maintain oral intake and was sent to ED. She was found to have bowel obstruction and underwent  exploratory laparotomy with transverse colectomy, with end colostomy and mucous fistula formation for obstructing colon lesion. Differential diagnosis prior to surgery was metastatic breast cancer in colon vs colon cancer. The patient did have a colonoscopy 2 years ago without the mass being present at that time but did have 2 small polyps in the transverse colon that were removed.  04/15/2017 Patient underwent transverse colon resection.  Pathology showed T4aN1 moderate differentiated adenocarcinoma, negative margin, 3/16 lymph nodes involved with cancer.  04/21/2017 She was seen in the ER earlier this week due to colostomy wound infection lateral to her right-sided end colostomy and to the midsection of her midline incision. She was started on antibiotics for cellulitis   INTERVAL HISTORY Patient presents to discuss about the results. She has had Mediport placement. During interval, she finished antibiotic course for her wound infection. Denies fever chills, shortness of breath, chest pain, abdominal pain,. She is well and have good ostomy function. She reports feeling well, with better energy level and  good appetite.    Review of Systems  Constitutional: Negative.   HENT:  Negative.   Eyes: Negative.   Respiratory: Negative.   Cardiovascular: Negative.   Gastrointestinal: Negative.   Endocrine: Negative.   Genitourinary: Negative.    Musculoskeletal: Negative.   Neurological: Negative.   Hematological: Negative.   Psychiatric/Behavioral: Negative.     MEDICAL HISTORY: Past Medical History:  Diagnosis Date  . Breast cancer ([place])   . Breast cancer, left ([facility])    Lumpectomy and rad tx's.  . Chronic back pain   . Chronic knee pain   . Colon cancer ([place])   . Degenerative disc disease, lumbar 04/2013  . Dyspnea   . Family history of adverse reaction to anesthesia    son arrested after anesthesia  . GERD (gastroesophageal reflux disease)   . Hyperlipidemia   . Neuropathy   . Osteoarthritis    of knee  . Osteoporosis   . Tobacco abuse   . Vitamin D deficiency     SURGICAL HISTORY: Past Surgical History:  Procedure Laterality Date  . APPENDECTOMY  1965  . BREAST EXCISIONAL BIOPSY Left 08/01/2003   lumpectomy rad 11/04-2/28/2005  . CESAREAN SECTION     x3  . COLON SURGERY    . COLONOSCOPY W/ POLYPECTOMY    . ESOPHAGOGASTRODUODENOSCOPY (EGD) WITH PROPOFOL N/A 04/04/2017   Procedure: ESOPHAGOGASTRODUODENOSCOPY (EGD) WITH PROPOFOL;  Surgeon: [name], MD;  Location: [facility];  Service: Endoscopy;  Laterality: N/A;  . HIP FRACTURE SURGERY Left 01/25/2012  . LAPAROTOMY N/A 04/15/2017   Procedure: EXPLORATORY LAPAROTOMY;  Surgeon: Clayburn Pert, MD;  Location: [facility];  Service: General;  Laterality: N/A;  . NECK SURGERY  12/2011  . PORTACATH PLACEMENT  Right 05/21/2017   Procedure: INSERTION PORT-A-CATH;  Surgeon: Clayburn Pert, MD;  Location: [facility];  Service: General;  Laterality: Right;  . WRIST FRACTURE SURGERY      SOCIAL HISTORY: Social History   Social History  . Marital status: Divorced    Spouse name: N/A  . Number of children: N/A  .  Years of education: N/A   Occupational History  . Not on file.   Social History Main Topics  . Smoking status: Current Every Day Smoker    Packs/day: 0.25    Years: 55.00    Types: Cigarettes  . Smokeless tobacco: Never Used     Comment: started age 20 1/3 to 1 ppd  . Alcohol use 0.0 oz/week     Comment: occasionally drinks beer  . Drug use: No  . Sexual activity: No   Other Topics Concern  . Not on file   Social History Narrative  . No narrative on file    FAMILY HISTORY Family History  Problem Relation Age of Onset  . Diabetes Mother        type 2  . Coronary artery disease Mother   . Deep vein thrombosis Mother   . Colon cancer Father   . Asthma Brother   . Diabetes Brother        type 2    ALLERGIES:  is allergic to other; sinus formula [cholestatin]; and iodine.  MEDICATIONS:  Current Outpatient Prescriptions  Medication Sig Dispense Refill  . budesonide-formoterol (SYMBICORT) 80-4.5 MCG/ACT inhaler Inhale 2 puffs into the lungs 2 (two) times daily.    . Calcium Citrate-Vitamin D (CITRACAL/VITAMIN D PO) Take 1 tablet by mouth daily.     . feeding supplement (BOOST / RESOURCE BREEZE) LIQD Take 1 Container by mouth 3 (three) times daily between meals. (Patient taking differently: Take 1 Container by mouth 2 (two) times daily. ) 30 Container 0  . furosemide (LASIX) 20 MG tablet Take 1 tablet (20 mg total) by mouth daily. 30 tablet 3  . HYDROcodone-acetaminophen (NORCO) 7.5-325 MG tablet Take 1-2 tablets by mouth every 6 (six) hours as needed for moderate pain. 120 tablet 0  . ibuprofen (ADVIL,MOTRIN) 200 MG tablet Take 400 mg by mouth daily as needed for headache or moderate pain.    Marland Kitchen lidocaine-prilocaine (EMLA) cream Apply to affected area once 30 g 3  . meloxicam (MOBIC) 15 MG tablet TAKE 1 TABLET EVERY DAY AS NEEDED (Patient taking differently: take 46m by mouth daily) 90 tablet 3  . nicotine (NICODERM CQ - DOSED IN MG/24 HOURS) 21 mg/24hr patch Place 1 patch  (21 mg total) onto the skin daily. 28 patch 0  . omeprazole (PRILOSEC) 20 MG capsule TAKE 1 CAPSULE EVERY DAY 90 capsule 4  . ondansetron (ZOFRAN) 8 MG tablet Take 1 tablet (8 mg total) by mouth 2 (two) times daily as needed for refractory nausea / vomiting. Start on day 3 after chemotherapy. 30 tablet 1  . pravastatin (PRAVACHOL) 40 MG tablet TAKE 1 TABLET (40 MG TOTAL) BY MOUTH DAILY. 90 tablet 4  . Probiotic Product (PROBIOTIC DAILY PO) Take 1 capsule by mouth daily.     . prochlorperazine (COMPAZINE) 10 MG tablet Take 1 tablet (10 mg total) by mouth every 6 (six) hours as needed (Nausea or vomiting). 30 tablet 1  . Triamcinolone Acetonide (NASACORT ALLERGY 24HR NA) Place 1 spray into the nose daily as needed (allergies).    . varenicline (CHANTIX CONTINUING MONTH PAK) 1 MG tablet Take 1  tablet (1 mg total) by mouth 2 (two) times daily. 60 tablet 2  . varenicline (CHANTIX STARTING MONTH PAK) 0.5 MG X 11 & 1 MG X 42 tablet Take one 0.5 mg tablet daily for 3 days, then take one 0.5 mg tablet twice daily for 4 days, then take one 1 mg tablet twice daily 53 tablet 0   No current facility-administered medications for this visit.     PHYSICAL EXAMINATION:  ECOG PERFORMANCE STATUS: 0 - Asymptomatic  Vitals:   05/27/17 0849  BP: 131/78  Pulse: 78  Resp: 16  Temp: 98.5 F (36.9 C)    Filed Weights   05/27/17 0849  Weight: 146 lb 3 oz (66.3 kg)    Physical Exam GENERAL: No distress, well nourished.  SKIN:  No rashes or significant lesions  HEAD: Normocephalic, No masses, lesions, tenderness or abnormalities  EYES: Conjunctiva are pink, non icteric ENT: External ears normal ,lips , buccal mucosa, and tongue normal and mucous membranes are moist  LYMPH: No palpable cervical and axillary lymphadenopathy  LUNGS: Clear to auscultation, no crackles or wheezes HEART: Regular rate & rhythm, no murmurs, no gallops, S1 normal and S2 normal  ABDOMEN: Abdomen soft, normal bowel sounds, I did not  appreciate any  masses or organomegaly. surgical scar covered with dressing, + colostomy.  MUSCULOSKELETAL: No CVA tenderness and no tenderness on percussion of the back or rib cage.  EXTREMITIES: No edema, no skin discoloration or tenderness NEURO: Alert & oriented, no focal motor/sensory deficits.   LABORATORY DATA: I have personally reviewed the data as listed: CBC    Component Value Date/Time   WBC 12.1 (H) 05/27/2017 0831   RBC 3.65 (L) 05/27/2017 0831   HGB 11.0 (L) 05/27/2017 0831   HGB 12.2 01/02/2017 1410   HCT 31.8 (L) 05/27/2017 0831   HCT 37.3 01/02/2017 1410   PLT 575 (H) 05/27/2017 0831   PLT 486 (H) 01/02/2017 1410   MCV 87.1 05/27/2017 0831   MCV 86 01/02/2017 1410   MCV 96 01/29/2012 0428   MCH 30.1 05/27/2017 0831   MCHC 34.5 05/27/2017 0831   RDW 18.4 (H) 05/27/2017 0831   RDW 16.1 (H) 01/02/2017 1410   RDW 12.7 01/29/2012 0428   LYMPHSABS 2.5 05/27/2017 0831   LYMPHSABS 3.9 (H) 01/02/2017 1410   LYMPHSABS 1.9 01/29/2012 0428   MONOABS 1.6 (H) 05/27/2017 0831   MONOABS 2.0 (H) 01/29/2012 0428   EOSABS 0.4 05/27/2017 0831   EOSABS 0.4 01/02/2017 1410   EOSABS 0.1 01/29/2012 0428   BASOSABS 0.2 (H) 05/27/2017 0831   BASOSABS 0.1 01/02/2017 1410   BASOSABS 0.1 01/29/2012 0428   CMP Latest Ref Rng & Units 05/16/2017 05/03/2017 04/21/2017  Glucose 65 - 99 mg/dL - 105(H) 95  BUN 6 - 20 mg/dL - 8 7  Creatinine 0.44 - 1.00 mg/dL - 0.50 0.51  Sodium 135 - 145 mmol/L - 135 134(L)  Potassium 3.5 - 5.1 mmol/L 4.1 3.3(L) 3.9  Chloride 101 - 111 mmol/L - 103 99(L)  CO2 22 - 32 mmol/L - 25 26  Calcium 8.9 - 10.3 mg/dL - 7.4(L) 8.2(L)  Total Protein 6.5 - 8.1 g/dL - 5.3(L) 5.6(L)  Total Bilirubin 0.3 - 1.2 mg/dL - 0.5 0.6  Alkaline Phos 38 - 126 U/L - 73 97  AST 15 - 41 U/L - 14(L) 20  ALT 14 - 54 U/L - 10(L) 16    RADIOGRAPHIC STUDIES: I have personally reviewed the radiological images as listed and agree with  the findings in the report 04/08/2017 CT abdomen  pelvis w contrast IMPRESSION: 1. Partial obstruction at the level of the transverse colon, likely secondary to carcinoma. 2. Ileocolic mesenteric adenopathy, suspicious for nodal metastasis. 3.  Aortic Atherosclerosis (ICD10-I70.0). 4. Bilateral adrenal adenomas. 04/21/2017 CT abdomen pelvis with contrast. IMPRESSION: Postsurgical changes consistent with the given clinical history. A small amount of fluid in a year is noted surrounding the right lower quadrant ostomy likely felt to be postoperative in nature. No definitive abscess is seen. The patient's palpable abnormality likely corresponds to the small fluid collection.  PATHOLOGY DIAGNOSIS:  A. COLON MASS, TRANSVERSE; TRANSVERSE COLECTOMY:  - INVASIVE ADENOCARCINOMA, MODERATELY DIFFERENTIATED.  - THREE OF SIXTEEN LYMPH NODES INVOLVED BY METASTASIS (3/16).  - LYMPHOVASCULAR AND PERINEURAL INVASION PRESENT.  - SEE SUMMARY BELOW.  Surgical Pathology Cancer Case Summary  COLON AND RECTUM:  Procedure: transverse colectomy  Tumor Site:  transverse colon  Tumor Size: Greatest dimension: 2.3 cm  Macroscopic Tumor Perforation: not specified  Histologic Type: adenocarcinoma  Histologic Grade: G2, moderately differentiated  Tumor Extension: tumor invades the visceral peritoneum  Margins: all margins are uninvolved by invasive carcinoma, high-grade dysplasia, intramucosal adenocarcinoma, and adenoma  Treatment Effect: no known presurgical therapy  Lymphovascular Invasion: present  Perineural Invasion: present  Tumor Deposits: not identified  Regional Lymph Nodes: # examined: 16  # involved: 3  Pathologic Stage Classification (pTNM, AJCC 8th Edition): pT4a pN1b   ADDENDUM:   MICROSATELLITE INSTABILITY IMMUNOHISTOCHEMISTRY  MISMATCH REPAIR PROTEINS:   MLH1: Intact nuclear expression  MSH2: Intact nuclear expression  MSH6: Intact nuclear expression  PMS2: Intact nuclear expression   Interpretation: No loss of nuclear  expression of mismatch repair  proteins: Low probability of MSI-H.    ASSESSMENT/PLAN 71 yo female who has MSI stable stage IIIB Colon Cancer starting adjuvant FOLFOX.   Cancer Staging Malignant neoplasm of transverse colon [facility]) Staging form: Colon and Rectum, AJCC 8th Edition - Clinical stage from 04/15/2017: Stage IIIB (cT4a, cN1b, cM0) - Signed by Earlie Server, MD on 04/26/2017  1. Malignant neoplasm of transverse colon ([place])   2. Hypokalemia   3. Hypomagnesemia   4. Encounter for antineoplastic chemotherapy   5. Normocytic anemia   6. Iron deficiency anemia due to chronic blood loss    Okay to start cycle 1 FOLFOX, Goal of care is curative intent. She actually has recovered really well from her surgery and PS 0, therefore I plan 100% dose for cycle 1 and see how she does.   I explained to the patient the risks and benefits of chemotherapy including all but not limited to nausea, vomiting, hair loss, hand and foot syndrome, diarrhea, low blood counts and risk of infection and hospitalization. Risk of neuropathy is associated with Oxaliplatin. Patient understands and agrees to proceed as planned. # She has been to chemotherapy class.   # As she has T4 disease, after she finishes systemic chemotherapy, I think she would benefit from radiation to reduce local recurrence. # baseline CEA pending.   # hypokalemia and hypomagnesia: will get IV KCL 40 meq today. Sent oral potassium supplement to pharmacy. Mg level was checked and is low at 1.1.     Will also give Magnesium sulfate 4g IV today. Also sent oral SLOW Mag to Rx. Recheck K and Mg level on 05/29/2017..  # Anemia likely due to iron deficiency from previous chronic blood loss from tumor and blood loss from surgery. Plan IV iron with Venofer 275m weekly for 4 doses.  Orders Placed This Encounter  Procedures  . CBC with Differential    Standing Status:   Standing    Number of Occurrences:   20    Standing Expiration Date:   05/28/2018   . Comprehensive metabolic panel    Standing Status:   Standing    Number of Occurrences:   20    Standing Expiration Date:   05/28/2018  . CEA    Standing Status:   Future    Standing Expiration Date:   05/27/2018  . Iron and TIBC    Standing Status:   Future    Number of Occurrences:   1    Standing Expiration Date:   05/27/2018  . Ferritin    Standing Status:   Future    Number of Occurrences:   1    Standing Expiration Date:   05/27/2018  . Magnesium    Standing Status:   Future    Standing Expiration Date:   05/27/2018    All questions were answered. The patient knows to call the clinic with any problems, questions or concerns.  Follow up in 2 weeks on day 1 of cycle 2  Dr. Earlie Server, MD, PhD [facility] at [facility] Pager- [phone] 05/27/2017

## 2017-05-27 NOTE — Progress Notes (Signed)
Patient here today for follow up.  Patient states no new concerns today  

## 2017-05-28 ENCOUNTER — Encounter: Payer: Self-pay | Admitting: General Surgery

## 2017-05-28 ENCOUNTER — Ambulatory Visit (INDEPENDENT_AMBULATORY_CARE_PROVIDER_SITE_OTHER): Payer: Medicare HMO | Admitting: General Surgery

## 2017-05-28 VITALS — BP 148/112 | HR 68 | Temp 97.7°F | Wt 147.0 lb

## 2017-05-28 DIAGNOSIS — C184 Malignant neoplasm of transverse colon: Secondary | ICD-10-CM | POA: Diagnosis not present

## 2017-05-28 DIAGNOSIS — Z4889 Encounter for other specified surgical aftercare: Secondary | ICD-10-CM

## 2017-05-28 LAB — CEA: CEA1: 2.1 ng/mL (ref 0.0–4.7)

## 2017-05-28 NOTE — Progress Notes (Signed)
Outpatient Surgical Follow Up  05/28/2017  Kimberly [name] is an 71 y.o. female.   Chief Complaint  Patient presents with  . Routine Post Op    Exploratory Laparotomy with Colostomy and Mucous Fistula creation (04/15/17)- Dr. Adonis Huguenin and Port-A-Cath 05/21/2017-DrAdonis Huguenin    HPI: 71 year old female returns to clinic 1 week status post chemotherapy port placement. She is currently undergoing chemotherapy for her obstructing colon cancer. She reports that she is tolerating the chemotherapy well. Her incisions are healing well. Her ostomy appliance is working well. She denies any fevers, chills, nausea, vomiting, chest pain, shortness of breath.  Past Medical History:  Diagnosis Date  . Breast cancer ([place])   . Breast cancer, left ([facility])    Lumpectomy and rad tx's.  . Chronic back pain   . Chronic knee pain   . Colon cancer ([place])   . Degenerative disc disease, lumbar 04/2013  . Dyspnea   . Family history of adverse reaction to anesthesia    son arrested after anesthesia  . GERD (gastroesophageal reflux disease)   . Hyperlipidemia   . Iron deficiency anemia 05/27/2017  . Neuropathy   . Osteoarthritis    of knee  . Osteoporosis   . Tobacco abuse   . Vitamin D deficiency     Past Surgical History:  Procedure Laterality Date  . APPENDECTOMY  1965  . BREAST EXCISIONAL BIOPSY Left 08/01/2003   lumpectomy rad 11/04-2/28/2005  . CESAREAN SECTION     x3  . COLON SURGERY    . COLONOSCOPY W/ POLYPECTOMY    . ESOPHAGOGASTRODUODENOSCOPY (EGD) WITH PROPOFOL N/A 04/04/2017   Procedure: ESOPHAGOGASTRODUODENOSCOPY (EGD) WITH PROPOFOL;  Surgeon: [name], MD;  Location: [place];  Service: Endoscopy;  Laterality: N/A;  . HIP FRACTURE SURGERY Left 01/25/2012  . LAPAROTOMY N/A 04/15/2017   Procedure: EXPLORATORY LAPAROTOMY;  Surgeon: Clayburn Pert, MD;  Location: [facility];  Service: General;  Laterality: N/A;  . NECK SURGERY  12/2011  . PORTACATH PLACEMENT Right 05/21/2017    Procedure: INSERTION PORT-A-CATH;  Surgeon: Clayburn Pert, MD;  Location: [facility];  Service: General;  Laterality: Right;  . WRIST FRACTURE SURGERY      Family History  Problem Relation Age of Onset  . Diabetes Mother        type 2  . Coronary artery disease Mother   . Deep vein thrombosis Mother   . Colon cancer Father   . Asthma Brother   . Diabetes Brother        type 2    Social History:  reports that she quit smoking 7 days ago. Her smoking use included Cigarettes. She has a 13.75 pack-year smoking history. She has never used smokeless tobacco. She reports that she drinks alcohol. She reports that she does not use drugs.  Allergies:  Allergies  Allergen Reactions  . Other Nausea And Vomiting    Allergy medicines   . Sinus Formula [Cholestatin] Nausea Only  . Iodine Other (See Comments)    Topical iodine she states "if you put it in an open sore it will cause a eating ulcer."    Medications reviewed.    ROS A multipoint review of systems was completed, all pertinent positives and negatives are documented in the history of present illness and remainder are negative   BP (!) 148/112   Pulse 68   Temp 97.7 F (36.5 C) (Oral)   Wt 66.7 kg (147 lb)   BMI 24.46 kg/m   Physical Exam Gen.: No  acute distress  neck: Supple and nontender Chest: Right subclavian Chemo-Port in place is currently accessed and working well without evidence of erythema or drainage. Chest is clear to auscultation Heart: Regular rhythm Abdomen: Soft, nontender, nondistended. Well healed midline incision. Mucous fistula left upper quadrant and functioning end colostomy in the right upper quadrant.    Results for orders placed or performed in visit on 05/27/17 (from the past 48 hour(s))  Magnesium     Status: Abnormal   Collection Time: 05/27/17  8:32 AM  Result Value Ref Range   Magnesium 1.1 (L) 1.7 - 2.4 mg/dL   No results found.  Assessment/Plan:  1. Aftercare following  surgery 71 year female status post chemotherapy port placement for colon cancer. Doing very well. Discussed signs and symptoms of infection and return to clinic immediately should they occur. Otherwise she'll follow-up in clinic in 5 weeks for additional wound check and to discuss further workup prior to her reversal of her colostomy.     Clayburn Pert, MD FACS General Surgeon  05/28/2017,12:39 PM

## 2017-05-28 NOTE — Patient Instructions (Addendum)
Congratulations for quitting on smoking!!!  We will let Wetzel Bjornstad, RN know that she needs to get in contact with you. If you do not hear from her in 3 days, please give [place] a call.  Please give [place] a call if you notice any signs of infection.  Please give [place] a call if you have fever/chills.  We will see you back in 5 weeks to make sure that you are doing well.

## 2017-05-29 ENCOUNTER — Inpatient Hospital Stay: Payer: Medicare HMO

## 2017-05-29 ENCOUNTER — Telehealth: Payer: Self-pay

## 2017-05-29 DIAGNOSIS — E876 Hypokalemia: Secondary | ICD-10-CM

## 2017-05-29 DIAGNOSIS — C184 Malignant neoplasm of transverse colon: Secondary | ICD-10-CM

## 2017-05-29 DIAGNOSIS — R0602 Shortness of breath: Secondary | ICD-10-CM | POA: Diagnosis not present

## 2017-05-29 DIAGNOSIS — M5136 Other intervertebral disc degeneration, lumbar region: Secondary | ICD-10-CM | POA: Diagnosis not present

## 2017-05-29 DIAGNOSIS — M549 Dorsalgia, unspecified: Secondary | ICD-10-CM | POA: Diagnosis not present

## 2017-05-29 DIAGNOSIS — D500 Iron deficiency anemia secondary to blood loss (chronic): Secondary | ICD-10-CM | POA: Diagnosis not present

## 2017-05-29 DIAGNOSIS — I700 Atherosclerosis of aorta: Secondary | ICD-10-CM | POA: Diagnosis not present

## 2017-05-29 DIAGNOSIS — C779 Secondary and unspecified malignant neoplasm of lymph node, unspecified: Secondary | ICD-10-CM | POA: Diagnosis not present

## 2017-05-29 LAB — POTASSIUM: Potassium: 5.1 mmol/L (ref 3.5–5.1)

## 2017-05-29 LAB — MAGNESIUM: Magnesium: 2 mg/dL (ref 1.7–2.4)

## 2017-05-29 MED ORDER — SODIUM CHLORIDE 0.9% FLUSH
10.0000 mL | INTRAVENOUS | Status: DC | PRN
  Filled 2017-05-29: qty 10

## 2017-05-29 MED ORDER — HEPARIN SOD (PORK) LOCK FLUSH 100 UNIT/ML IV SOLN
500.0000 [IU] | Freq: Once | INTRAVENOUS | Status: AC | PRN
Start: 2017-05-29 — End: 2017-05-29
  Administered 2017-05-29: 500 [IU]
  Filled 2017-05-29: qty 5

## 2017-05-29 MED ORDER — HEPARIN SOD (PORK) LOCK FLUSH 100 UNIT/ML IV SOLN
500.0000 [IU] | Freq: Once | INTRAVENOUS | Status: DC
  Filled 2017-05-29: qty 5

## 2017-05-29 MED ORDER — SODIUM CHLORIDE 0.9% FLUSH
10.0000 mL | INTRAVENOUS | Status: DC | PRN
  Administered 2017-05-29: 10 mL via INTRAVENOUS
  Filled 2017-05-29: qty 10

## 2017-05-29 NOTE — Telephone Encounter (Signed)
[place] to ask what services they were providing to the patient. I was told that they taught the patient how to change her ostomy ring and bag, clean the ostomy and how to order her supplies. I told them that the patient wanted to know why her ring and bag weren't changed by the nurses coming to her house. I was explained that since they are there to teach and making sure that the patient learns how to do things on her own since they are there for a short time. They stated that they will contact patient to make sure that the patient understands the services that are provided. I told them that I had contacted Domenic Moras, RN so she could follow up with her and to make sure that the patient has all of her questions answered.

## 2017-05-29 NOTE — Progress Notes (Unsigned)
Spoke with Dr. Tasia Catchings regarding patient's potassium and mag results, no mag/potassium today, but she said to have patient stop her p.o. Potassium, explained to patient and patient understands. LJ

## 2017-06-01 ENCOUNTER — Other Ambulatory Visit: Payer: Self-pay | Admitting: Oncology

## 2017-06-01 ENCOUNTER — Telehealth: Payer: Self-pay | Admitting: Oncology

## 2017-06-01 MED ORDER — SIMETHICONE 80 MG PO CHEW: 160.0000 mg | CHEWABLE_TABLET | Freq: Four times a day (QID) | ORAL | 0 refills | Status: DC | PRN

## 2017-06-01 NOTE — Telephone Encounter (Signed)
Patient called and says she has gas pain. She feels nausea and has been taking antiemetics. No vomiting.  Gas-X rx was sent to pharmacy.

## 2017-06-03 ENCOUNTER — Inpatient Hospital Stay: Payer: Medicare HMO | Attending: Oncology

## 2017-06-03 VITALS — BP 115/76 | HR 94 | Temp 98.2°F | Resp 18

## 2017-06-03 DIAGNOSIS — G8929 Other chronic pain: Secondary | ICD-10-CM | POA: Insufficient documentation

## 2017-06-03 DIAGNOSIS — R110 Nausea: Secondary | ICD-10-CM | POA: Insufficient documentation

## 2017-06-03 DIAGNOSIS — M25562 Pain in left knee: Secondary | ICD-10-CM | POA: Insufficient documentation

## 2017-06-03 DIAGNOSIS — M25561 Pain in right knee: Secondary | ICD-10-CM | POA: Diagnosis not present

## 2017-06-03 DIAGNOSIS — L03818 Cellulitis of other sites: Secondary | ICD-10-CM | POA: Insufficient documentation

## 2017-06-03 DIAGNOSIS — C184 Malignant neoplasm of transverse colon: Secondary | ICD-10-CM | POA: Diagnosis not present

## 2017-06-03 DIAGNOSIS — M810 Age-related osteoporosis without current pathological fracture: Secondary | ICD-10-CM | POA: Insufficient documentation

## 2017-06-03 DIAGNOSIS — K219 Gastro-esophageal reflux disease without esophagitis: Secondary | ICD-10-CM | POA: Diagnosis not present

## 2017-06-03 DIAGNOSIS — I700 Atherosclerosis of aorta: Secondary | ICD-10-CM | POA: Insufficient documentation

## 2017-06-03 DIAGNOSIS — Z5111 Encounter for antineoplastic chemotherapy: Secondary | ICD-10-CM | POA: Diagnosis not present

## 2017-06-03 DIAGNOSIS — Z800 Family history of malignant neoplasm of digestive organs: Secondary | ICD-10-CM | POA: Insufficient documentation

## 2017-06-03 DIAGNOSIS — Z923 Personal history of irradiation: Secondary | ICD-10-CM | POA: Insufficient documentation

## 2017-06-03 DIAGNOSIS — R599 Enlarged lymph nodes, unspecified: Secondary | ICD-10-CM | POA: Diagnosis not present

## 2017-06-03 DIAGNOSIS — M549 Dorsalgia, unspecified: Secondary | ICD-10-CM | POA: Diagnosis not present

## 2017-06-03 DIAGNOSIS — G629 Polyneuropathy, unspecified: Secondary | ICD-10-CM | POA: Insufficient documentation

## 2017-06-03 DIAGNOSIS — M5136 Other intervertebral disc degeneration, lumbar region: Secondary | ICD-10-CM | POA: Insufficient documentation

## 2017-06-03 DIAGNOSIS — D509 Iron deficiency anemia, unspecified: Secondary | ICD-10-CM | POA: Diagnosis not present

## 2017-06-03 DIAGNOSIS — D3502 Benign neoplasm of left adrenal gland: Secondary | ICD-10-CM | POA: Diagnosis not present

## 2017-06-03 DIAGNOSIS — D500 Iron deficiency anemia secondary to blood loss (chronic): Secondary | ICD-10-CM | POA: Insufficient documentation

## 2017-06-03 DIAGNOSIS — D3501 Benign neoplasm of right adrenal gland: Secondary | ICD-10-CM | POA: Diagnosis not present

## 2017-06-03 DIAGNOSIS — Z87891 Personal history of nicotine dependence: Secondary | ICD-10-CM | POA: Insufficient documentation

## 2017-06-03 DIAGNOSIS — M1990 Unspecified osteoarthritis, unspecified site: Secondary | ICD-10-CM | POA: Insufficient documentation

## 2017-06-03 DIAGNOSIS — Z853 Personal history of malignant neoplasm of breast: Secondary | ICD-10-CM | POA: Insufficient documentation

## 2017-06-03 DIAGNOSIS — E785 Hyperlipidemia, unspecified: Secondary | ICD-10-CM | POA: Insufficient documentation

## 2017-06-03 DIAGNOSIS — E559 Vitamin D deficiency, unspecified: Secondary | ICD-10-CM | POA: Insufficient documentation

## 2017-06-03 DIAGNOSIS — Z79899 Other long term (current) drug therapy: Secondary | ICD-10-CM | POA: Insufficient documentation

## 2017-06-03 DIAGNOSIS — E876 Hypokalemia: Secondary | ICD-10-CM | POA: Diagnosis not present

## 2017-06-03 MED ORDER — IRON SUCROSE 20 MG/ML IV SOLN
200.0000 mg | Freq: Once | INTRAVENOUS | Status: AC
  Administered 2017-06-03: 200 mg via INTRAVENOUS
  Filled 2017-06-03: qty 10

## 2017-06-03 MED ORDER — SODIUM CHLORIDE 0.9 % IV SOLN: 200.0000 mg | Freq: Once | INTRAVENOUS | Status: DC

## 2017-06-03 MED ORDER — HEPARIN SOD (PORK) LOCK FLUSH 100 UNIT/ML IV SOLN
INTRAVENOUS | Status: AC
  Filled 2017-06-03: qty 5

## 2017-06-03 MED ORDER — HEPARIN SOD (PORK) LOCK FLUSH 100 UNIT/ML IV SOLN
500.0000 [IU] | Freq: Once | INTRAVENOUS | Status: AC
  Administered 2017-06-03: 500 [IU] via INTRAVENOUS

## 2017-06-03 MED ORDER — SODIUM CHLORIDE 0.9 % IV SOLN
Freq: Once | INTRAVENOUS | Status: AC
  Administered 2017-06-03: 14:00:00 via INTRAVENOUS
  Filled 2017-06-03: qty 1000

## 2017-06-04 ENCOUNTER — Other Ambulatory Visit: Payer: Self-pay | Admitting: Family Medicine

## 2017-06-04 DIAGNOSIS — E785 Hyperlipidemia, unspecified: Secondary | ICD-10-CM | POA: Diagnosis not present

## 2017-06-04 DIAGNOSIS — M179 Osteoarthritis of knee, unspecified: Secondary | ICD-10-CM | POA: Diagnosis not present

## 2017-06-04 DIAGNOSIS — F17210 Nicotine dependence, cigarettes, uncomplicated: Secondary | ICD-10-CM | POA: Diagnosis not present

## 2017-06-04 DIAGNOSIS — M5136 Other intervertebral disc degeneration, lumbar region: Secondary | ICD-10-CM

## 2017-06-04 DIAGNOSIS — G8929 Other chronic pain: Secondary | ICD-10-CM | POA: Diagnosis not present

## 2017-06-04 DIAGNOSIS — M51369 Other intervertebral disc degeneration, lumbar region without mention of lumbar back pain or lower extremity pain: Secondary | ICD-10-CM

## 2017-06-04 DIAGNOSIS — Z483 Aftercare following surgery for neoplasm: Secondary | ICD-10-CM | POA: Diagnosis not present

## 2017-06-04 DIAGNOSIS — Z433 Encounter for attention to colostomy: Secondary | ICD-10-CM | POA: Diagnosis not present

## 2017-06-04 DIAGNOSIS — C189 Malignant neoplasm of colon, unspecified: Secondary | ICD-10-CM | POA: Diagnosis not present

## 2017-06-04 DIAGNOSIS — G629 Polyneuropathy, unspecified: Secondary | ICD-10-CM | POA: Diagnosis not present

## 2017-06-04 MED ORDER — HYDROCODONE-ACETAMINOPHEN 7.5-325 MG PO TABS: 1.0000 | ORAL_TABLET | Freq: Four times a day (QID) | ORAL | 0 refills | Status: DC | PRN

## 2017-06-10 ENCOUNTER — Encounter: Payer: Self-pay | Admitting: Oncology

## 2017-06-10 ENCOUNTER — Inpatient Hospital Stay (HOSPITAL_BASED_OUTPATIENT_CLINIC_OR_DEPARTMENT_OTHER): Payer: Medicare HMO | Admitting: Oncology

## 2017-06-10 ENCOUNTER — Inpatient Hospital Stay: Payer: Medicare HMO

## 2017-06-10 VITALS — BP 108/68 | HR 71 | Temp 98.4°F | Wt 143.5 lb

## 2017-06-10 DIAGNOSIS — M810 Age-related osteoporosis without current pathological fracture: Secondary | ICD-10-CM

## 2017-06-10 DIAGNOSIS — K219 Gastro-esophageal reflux disease without esophagitis: Secondary | ICD-10-CM

## 2017-06-10 DIAGNOSIS — M5136 Other intervertebral disc degeneration, lumbar region: Secondary | ICD-10-CM | POA: Diagnosis not present

## 2017-06-10 DIAGNOSIS — E785 Hyperlipidemia, unspecified: Secondary | ICD-10-CM

## 2017-06-10 DIAGNOSIS — Z79899 Other long term (current) drug therapy: Secondary | ICD-10-CM

## 2017-06-10 DIAGNOSIS — M25561 Pain in right knee: Secondary | ICD-10-CM | POA: Diagnosis not present

## 2017-06-10 DIAGNOSIS — D500 Iron deficiency anemia secondary to blood loss (chronic): Secondary | ICD-10-CM | POA: Diagnosis not present

## 2017-06-10 DIAGNOSIS — R599 Enlarged lymph nodes, unspecified: Secondary | ICD-10-CM

## 2017-06-10 DIAGNOSIS — L03818 Cellulitis of other sites: Secondary | ICD-10-CM | POA: Diagnosis not present

## 2017-06-10 DIAGNOSIS — Z923 Personal history of irradiation: Secondary | ICD-10-CM

## 2017-06-10 DIAGNOSIS — Z87891 Personal history of nicotine dependence: Secondary | ICD-10-CM

## 2017-06-10 DIAGNOSIS — E876 Hypokalemia: Secondary | ICD-10-CM | POA: Diagnosis not present

## 2017-06-10 DIAGNOSIS — M549 Dorsalgia, unspecified: Secondary | ICD-10-CM

## 2017-06-10 DIAGNOSIS — Z5111 Encounter for antineoplastic chemotherapy: Secondary | ICD-10-CM

## 2017-06-10 DIAGNOSIS — R110 Nausea: Secondary | ICD-10-CM | POA: Diagnosis not present

## 2017-06-10 DIAGNOSIS — D3502 Benign neoplasm of left adrenal gland: Secondary | ICD-10-CM

## 2017-06-10 DIAGNOSIS — E559 Vitamin D deficiency, unspecified: Secondary | ICD-10-CM

## 2017-06-10 DIAGNOSIS — M1990 Unspecified osteoarthritis, unspecified site: Secondary | ICD-10-CM

## 2017-06-10 DIAGNOSIS — G8929 Other chronic pain: Secondary | ICD-10-CM

## 2017-06-10 DIAGNOSIS — Z800 Family history of malignant neoplasm of digestive organs: Secondary | ICD-10-CM

## 2017-06-10 DIAGNOSIS — Z853 Personal history of malignant neoplasm of breast: Secondary | ICD-10-CM

## 2017-06-10 DIAGNOSIS — C184 Malignant neoplasm of transverse colon: Secondary | ICD-10-CM | POA: Diagnosis not present

## 2017-06-10 DIAGNOSIS — M25562 Pain in left knee: Secondary | ICD-10-CM

## 2017-06-10 DIAGNOSIS — D3501 Benign neoplasm of right adrenal gland: Secondary | ICD-10-CM

## 2017-06-10 DIAGNOSIS — D509 Iron deficiency anemia, unspecified: Secondary | ICD-10-CM

## 2017-06-10 DIAGNOSIS — I700 Atherosclerosis of aorta: Secondary | ICD-10-CM

## 2017-06-10 DIAGNOSIS — G629 Polyneuropathy, unspecified: Secondary | ICD-10-CM

## 2017-06-10 LAB — CBC WITH DIFFERENTIAL/PLATELET
BASOS ABS: 0.2 10*3/uL — AB (ref 0–0.1)
BASOS PCT: 2 %
Eosinophils Absolute: 0.3 10*3/uL (ref 0–0.7)
Eosinophils Relative: 3 %
HEMATOCRIT: 33.7 % — AB (ref 35.0–47.0)
HEMOGLOBIN: 11.6 g/dL — AB (ref 12.0–16.0)
LYMPHS PCT: 27 %
Lymphs Abs: 2.4 10*3/uL (ref 1.0–3.6)
MCH: 31.1 pg (ref 26.0–34.0)
MCHC: 34.5 g/dL (ref 32.0–36.0)
MCV: 90.1 fL (ref 80.0–100.0)
MONO ABS: 1.3 10*3/uL — AB (ref 0.2–0.9)
Monocytes Relative: 14 %
NEUTROS ABS: 4.8 10*3/uL (ref 1.4–6.5)
NEUTROS PCT: 54 %
Platelets: 320 10*3/uL (ref 150–440)
RBC: 3.74 MIL/uL — AB (ref 3.80–5.20)
RDW: 17 % — AB (ref 11.5–14.5)
WBC: 8.9 10*3/uL (ref 3.6–11.0)

## 2017-06-10 LAB — COMPREHENSIVE METABOLIC PANEL
ALBUMIN: 3.1 g/dL — AB (ref 3.5–5.0)
ALT: 12 U/L — AB (ref 14–54)
AST: 20 U/L (ref 15–41)
Alkaline Phosphatase: 58 U/L (ref 38–126)
Anion gap: 6 (ref 5–15)
BILIRUBIN TOTAL: 0.4 mg/dL (ref 0.3–1.2)
BUN: 9 mg/dL (ref 6–20)
CO2: 24 mmol/L (ref 22–32)
CREATININE: 0.73 mg/dL (ref 0.44–1.00)
Calcium: 8.5 mg/dL — ABNORMAL LOW (ref 8.9–10.3)
Chloride: 105 mmol/L (ref 101–111)
GFR calc Af Amer: >60 mL/min (ref >60)
GFR calc non Af Amer: >60 mL/min (ref >60)
GLUCOSE: 129 mg/dL — AB (ref 65–99)
POTASSIUM: 3.7 mmol/L (ref 3.5–5.1)
Sodium: 135 mmol/L (ref 135–145)
TOTAL PROTEIN: 6.4 g/dL — AB (ref 6.5–8.1)

## 2017-06-10 LAB — MAGNESIUM: Magnesium: 1.1 mg/dL — ABNORMAL LOW (ref 1.7–2.4)

## 2017-06-10 MED ORDER — MAGNESIUM SULFATE 2 GM/50ML IV SOLN
2.0000 g | Freq: Once | INTRAVENOUS | Status: AC
  Administered 2017-06-10: 2 g via INTRAVENOUS
  Filled 2017-06-10: qty 50

## 2017-06-10 MED ORDER — PALONOSETRON HCL INJECTION 0.25 MG/5ML
0.2500 mg | Freq: Once | INTRAVENOUS | Status: AC
  Administered 2017-06-10: 0.25 mg via INTRAVENOUS
  Filled 2017-06-10: qty 5

## 2017-06-10 MED ORDER — MAGNESIUM CHLORIDE 64 MG PO TBEC: 1.0000 | DELAYED_RELEASE_TABLET | Freq: Every day | ORAL | 0 refills | Status: DC

## 2017-06-10 MED ORDER — FLUOROURACIL CHEMO INJECTION 2.5 GM/50ML
400.0000 mg/m2 | Freq: Once | INTRAVENOUS | Status: AC
  Administered 2017-06-10: 700 mg via INTRAVENOUS
  Filled 2017-06-10: qty 14

## 2017-06-10 MED ORDER — DEXTROSE 5 % IV SOLN
700.0000 mg | Freq: Once | INTRAVENOUS | Status: AC
Start: 2017-06-10 — End: 2017-06-10
  Administered 2017-06-10: 700 mg via INTRAVENOUS
  Filled 2017-06-10: qty 25

## 2017-06-10 MED ORDER — DEXAMETHASONE SODIUM PHOSPHATE 10 MG/ML IJ SOLN
10.0000 mg | Freq: Once | INTRAMUSCULAR | Status: AC
  Administered 2017-06-10: 10 mg via INTRAVENOUS
  Filled 2017-06-10: qty 1

## 2017-06-10 MED ORDER — OXALIPLATIN CHEMO INJECTION 100 MG/20ML
85.0000 mg/m2 | Freq: Once | INTRAVENOUS | Status: AC
  Administered 2017-06-10: 150 mg via INTRAVENOUS
  Filled 2017-06-10: qty 20

## 2017-06-10 MED ORDER — HEPARIN SOD (PORK) LOCK FLUSH 100 UNIT/ML IV SOLN
500.0000 [IU] | Freq: Once | INTRAVENOUS | Status: DC
  Filled 2017-06-10: qty 5

## 2017-06-10 MED ORDER — FLUOROURACIL CHEMO INJECTION 5 GM/100ML
2400.0000 mg/m2 | INTRAVENOUS | Status: AC
  Administered 2017-06-10: 4200 mg via INTRAVENOUS
  Filled 2017-06-10: qty 84

## 2017-06-10 MED ORDER — SODIUM CHLORIDE 0.9 % IV SOLN: 2.0000 g | Freq: Once | INTRAVENOUS | Status: DC

## 2017-06-10 MED ORDER — IRON SUCROSE 20 MG/ML IV SOLN
200.0000 mg | Freq: Once | INTRAVENOUS | Status: AC
  Administered 2017-06-10: 200 mg via INTRAVENOUS
  Filled 2017-06-10: qty 10

## 2017-06-10 MED ORDER — DEXTROSE 5 % IV SOLN
Freq: Once | INTRAVENOUS | Status: AC
  Administered 2017-06-10: 12:00:00 via INTRAVENOUS
  Filled 2017-06-10: qty 1000

## 2017-06-10 MED ORDER — SODIUM CHLORIDE 0.9 % IV SOLN
Freq: Once | INTRAVENOUS | Status: AC
  Administered 2017-06-10: 11:00:00 via INTRAVENOUS
  Filled 2017-06-10: qty 1000

## 2017-06-10 MED ORDER — SODIUM CHLORIDE 0.9% FLUSH
10.0000 mL | Freq: Once | INTRAVENOUS | Status: AC
  Administered 2017-06-10: 10 mL via INTRAVENOUS
  Filled 2017-06-10: qty 10

## 2017-06-10 NOTE — Progress Notes (Signed)
Patient here today for follow up.  Patient states that she had diarrhea last week for 3.5 days, resolved with the use of imodium.

## 2017-06-10 NOTE — Progress Notes (Signed)
[facility] Initial Visit:  Patient Care Team: Birdie Sons, MD as PCP - General (Family Medicine)  CHIEF COMPLAINTS/PURPOSE OF CONSULTATION: I have colon cancer  HISTORY OF PRESENTING ILLNESS: Kimberly [name] 71 y.o. female with PMH listed as below is here for evaluation of colon cancer and management.  Patient reports a remote history of breast cancer s/p lumpectomy and radiation treatments. Patient had been having abdominal pain, weight loss and bloating.  CT scan showed partial obstruction at the level of transverse colon and ileocolic mesenteric adenopathy. She was prepared for a colonoscopy on 04/15/2017. She started to have worsened abdominal pain, nausea vomiting, unable to maintain oral intake and was sent to ED. She was found to have bowel obstruction and underwent  exploratory laparotomy with transverse colectomy, with end colostomy and mucous fistula formation for obstructing colon lesion. Differential diagnosis prior to surgery was metastatic breast cancer in colon vs colon cancer. The patient did have a colonoscopy 2 years ago without the mass being present at that time but did have 2 small polyps in the transverse colon that were removed.  04/15/2017 Patient underwent transverse colon resection.  Pathology showed T4aN1 moderate differentiated adenocarcinoma, negative margin, 3/16 lymph nodes involved with cancer.  04/21/2017 She was seen in the ER earlier this week due to colostomy wound infection lateral to her right-sided end colostomy and to the midsection of her midline incision. She was started on antibiotics for cellulitis   INTERVAL HISTORY Patient presents for evaluation prior to her cycle 2 FOLFOX.  She tolerated first cycle pretty well. She has mild nausea which is relieved by antiemetics. She reports 3 days of loose stools which improved with imodium PRN. She reports feeling well. Denies any numbness or tingling.   Review of Systems  Constitutional:  Negative.   HENT:  Negative.   Eyes: Negative.   Respiratory: Negative.   Cardiovascular: Negative.   Gastrointestinal: Negative.   Endocrine: Negative.   Genitourinary: Negative.    Musculoskeletal: Negative.   Neurological: Negative.   Hematological: Negative.   Psychiatric/Behavioral: Negative.     MEDICAL HISTORY: Past Medical History:  Diagnosis Date  . Breast cancer ([facility])   . Breast cancer, left ([facility])    Lumpectomy and rad tx's.  . Chronic back pain   . Chronic knee pain   . Colon cancer ([place])   . Degenerative disc disease, lumbar 04/2013  . Dyspnea   . Family history of adverse reaction to anesthesia    son arrested after anesthesia  . GERD (gastroesophageal reflux disease)   . Hyperlipidemia   . Iron deficiency anemia 05/27/2017  . Neuropathy   . Osteoarthritis    of knee  . Osteoporosis   . Tobacco abuse   . Vitamin D deficiency     SURGICAL HISTORY: Past Surgical History:  Procedure Laterality Date  . APPENDECTOMY  1965  . BREAST EXCISIONAL BIOPSY Left 08/01/2003   lumpectomy rad 11/04-2/28/2005  . CESAREAN SECTION     x3  . COLON SURGERY    . COLONOSCOPY W/ POLYPECTOMY    . ESOPHAGOGASTRODUODENOSCOPY (EGD) WITH PROPOFOL N/A 04/04/2017   Procedure: ESOPHAGOGASTRODUODENOSCOPY (EGD) WITH PROPOFOL;  Surgeon: [name], MD;  Location: [place];  Service: Endoscopy;  Laterality: N/A;  . HIP FRACTURE SURGERY Left 01/25/2012  . LAPAROTOMY N/A 04/15/2017   Procedure: EXPLORATORY LAPAROTOMY;  Surgeon: Clayburn Pert, MD;  Location: [facility];  Service: General;  Laterality: N/A;  . NECK SURGERY  12/2011  . PORTACATH  PLACEMENT Right 05/21/2017   Procedure: INSERTION PORT-A-CATH;  Surgeon: Clayburn Pert, MD;  Location: [facility];  Service: General;  Laterality: Right;  . WRIST FRACTURE SURGERY      SOCIAL HISTORY: Social History   Social History  . Marital status: Divorced    Spouse name: N/A  . Number of children: N/A  . Years of education:  N/A   Occupational History  . Not on file.   Social History Main Topics  . Smoking status: Former Smoker    Packs/day: 0.25    Years: 55.00    Types: Cigarettes    Quit date: 05/21/2017  . Smokeless tobacco: Never Used     Comment: started age 64 1/2 to 1 ppd  . Alcohol use 0.0 oz/week     Comment: occasionally drinks beer  . Drug use: No  . Sexual activity: No   Other Topics Concern  . Not on file   Social History Narrative  . No narrative on file    FAMILY HISTORY Family History  Problem Relation Age of Onset  . Diabetes Mother        type 2  . Coronary artery disease Mother   . Deep vein thrombosis Mother   . Colon cancer Father   . Asthma Brother   . Diabetes Brother        type 2    ALLERGIES:  is allergic to other; sinus formula [cholestatin]; and iodine.  MEDICATIONS:  Current Outpatient Prescriptions  Medication Sig Dispense Refill  . budesonide-formoterol (SYMBICORT) 80-4.5 MCG/ACT inhaler Inhale 2 puffs into the lungs 2 (two) times daily.    . Calcium Citrate-Vitamin D (CITRACAL/VITAMIN D PO) Take 1 tablet by mouth daily.     . feeding supplement (BOOST / RESOURCE BREEZE) LIQD Take 1 Container by mouth 3 (three) times daily between meals. (Patient taking differently: Take 1 Container by mouth 2 (two) times daily. ) 30 Container 0  . furosemide (LASIX) 20 MG tablet Take 1 tablet (20 mg total) by mouth daily. 30 tablet 3  . HYDROcodone-acetaminophen (NORCO) 7.5-325 MG tablet Take 1-2 tablets by mouth every 6 (six) hours as needed for moderate pain. 120 tablet 0  . ibuprofen (ADVIL,MOTRIN) 200 MG tablet Take 400 mg by mouth daily as needed for headache or moderate pain.    Marland Kitchen lidocaine-prilocaine (EMLA) cream Apply to affected area once 30 g 3  . LORazepam (ATIVAN) 0.5 MG tablet Take 1 tablet (0.5 mg total) by mouth every 6 (six) hours as needed (Nausea or vomiting). 30 tablet 0  . meloxicam (MOBIC) 15 MG tablet TAKE 1 TABLET EVERY DAY AS NEEDED (Patient taking  differently: take 58m by mouth daily) 90 tablet 3  . omeprazole (PRILOSEC) 20 MG capsule TAKE 1 CAPSULE EVERY DAY 90 capsule 4  . ondansetron (ZOFRAN) 8 MG tablet Take 1 tablet (8 mg total) by mouth 2 (two) times daily as needed for refractory nausea / vomiting. Start on day 3 after chemotherapy. 30 tablet 1  . pravastatin (PRAVACHOL) 40 MG tablet TAKE 1 TABLET (40 MG TOTAL) BY MOUTH DAILY. 90 tablet 4  . Probiotic Product (PROBIOTIC DAILY PO) Take 1 capsule by mouth daily.     . prochlorperazine (COMPAZINE) 10 MG tablet Take 1 tablet (10 mg total) by mouth every 6 (six) hours as needed (Nausea or vomiting). 30 tablet 1  . simethicone (MYLICON) 80 MG chewable tablet Chew 2 tablets (160 mg total) by mouth every 6 (six) hours as needed for flatulence. 120 tablet  0  . Triamcinolone Acetonide (NASACORT ALLERGY 24HR NA) Place 1 spray into the nose daily as needed (allergies).    . varenicline (CHANTIX CONTINUING MONTH PAK) 1 MG tablet Take 1 tablet (1 mg total) by mouth 2 (two) times daily. 60 tablet 2   No current facility-administered medications for this visit.    Facility-Administered Medications Ordered in Other Visits  Medication Dose Route Frequency Provider Last Rate Last Dose  . heparin lock flush 100 unit/mL  500 Units Intravenous Once Rickard Patience, MD      . heparin lock flush 100 unit/mL  500 Units Intravenous Once Rickard Patience, MD      . sodium chloride flush (NS) 0.9 % injection 10 mL  10 mL Intracatheter PRN Rickard Patience, MD      . sodium chloride flush (NS) 0.9 % injection 10 mL  10 mL Intravenous PRN Rickard Patience, MD   10 mL at 05/29/17 1347    PHYSICAL EXAMINATION:  ECOG PERFORMANCE STATUS: 0 - Asymptomatic  Vitals:   06/10/17 0938  BP: 108/68  Pulse: 71  Temp: 98.4 F (36.9 C)   Filed Weights   06/10/17 0938  Weight: 143 lb 8 oz (65.1 kg)    Physical Exam GENERAL: No distress, well nourished.  SKIN:  No rashes or significant lesions  HEAD: Normocephalic, No masses, lesions,  tenderness or abnormalities  EYES: Conjunctiva are pink, non icteric ENT: External ears normal ,lips , buccal mucosa, and tongue normal and mucous membranes are moist  LYMPH: No palpable cervical and axillary lymphadenopathy  LUNGS: Clear to auscultation, no crackles or wheezes HEART: Regular rate & rhythm, no murmurs, no gallops, S1 normal and S2 normal  ABDOMEN: Abdomen soft, normal bowel sounds, I did not appreciate any  masses or organomegaly. surgical scar covered with dressing, + colostomy.  MUSCULOSKELETAL: No CVA tenderness and no tenderness on percussion of the back or rib cage.  EXTREMITIES: No edema, no skin discoloration or tenderness NEURO: Alert & oriented, no focal motor/sensory deficits.   LABORATORY DATA: I have personally reviewed the data as listed: CBC    Component Value Date/Time   WBC 8.9 06/10/2017 0908   RBC 3.74 (L) 06/10/2017 0908   HGB 11.6 (L) 06/10/2017 0908   HGB 12.2 01/02/2017 1410   HCT 33.7 (L) 06/10/2017 0908   HCT 37.3 01/02/2017 1410   PLT 320 06/10/2017 0908   PLT 486 (H) 01/02/2017 1410   MCV 90.1 06/10/2017 0908   MCV 86 01/02/2017 1410   MCV 96 01/29/2012 0428   MCH 31.1 06/10/2017 0908   MCHC 34.5 06/10/2017 0908   RDW 17.0 (H) 06/10/2017 0908   RDW 16.1 (H) 01/02/2017 1410   RDW 12.7 01/29/2012 0428   LYMPHSABS 2.4 06/10/2017 0908   LYMPHSABS 3.9 (H) 01/02/2017 1410   LYMPHSABS 1.9 01/29/2012 0428   MONOABS 1.3 (H) 06/10/2017 0908   MONOABS 2.0 (H) 01/29/2012 0428   EOSABS 0.3 06/10/2017 0908   EOSABS 0.4 01/02/2017 1410   EOSABS 0.1 01/29/2012 0428   BASOSABS 0.2 (H) 06/10/2017 0908   BASOSABS 0.1 01/02/2017 1410   BASOSABS 0.1 01/29/2012 0428   CMP Latest Ref Rng & Units 06/10/2017 05/29/2017 05/27/2017  Glucose 65 - 99 mg/dL 347(T) - 84  BUN 6 - 20 mg/dL 9 - 10  Creatinine 8.90 - 1.00 mg/dL 9.83 - 8.61  Sodium 799 - 145 mmol/L 135 - 136  Potassium 3.5 - 5.1 mmol/L 3.7 5.1 3.0(L)  Chloride 101 - 111 mmol/L 105 -  101  CO2 22  - 32 mmol/L 24 - 25  Calcium 8.9 - 10.3 mg/dL 8.5(L) - 8.6(L)  Total Protein 6.5 - 8.1 g/dL 6.4(L) - 7.1  Total Bilirubin 0.3 - 1.2 mg/dL 0.4 - 0.4  Alkaline Phos 38 - 126 U/L 58 - 61  AST 15 - 41 U/L 20 - 16  ALT 14 - 54 U/L 12(L) - 10(L)    RADIOGRAPHIC STUDIES: I have personally reviewed the radiological images as listed and agree with the findings in the report 04/08/2017 CT abdomen pelvis w contrast IMPRESSION: 1. Partial obstruction at the level of the transverse colon, likely secondary to carcinoma. 2. Ileocolic mesenteric adenopathy, suspicious for nodal metastasis. 3.  Aortic Atherosclerosis (ICD10-I70.0). 4. Bilateral adrenal adenomas. 04/21/2017 CT abdomen pelvis with contrast. IMPRESSION: Postsurgical changes consistent with the given clinical history. A small amount of fluid in a year is noted surrounding the right lower quadrant ostomy likely felt to be postoperative in nature. No definitive abscess is seen. The patient's palpable abnormality likely corresponds to the small fluid collection.  PATHOLOGY DIAGNOSIS:  A. COLON MASS, TRANSVERSE; TRANSVERSE COLECTOMY:  - INVASIVE ADENOCARCINOMA, MODERATELY DIFFERENTIATED.  - THREE OF SIXTEEN LYMPH NODES INVOLVED BY METASTASIS (3/16).  - LYMPHOVASCULAR AND PERINEURAL INVASION PRESENT.  - SEE SUMMARY BELOW.  Surgical Pathology Cancer Case Summary  COLON AND RECTUM:  Procedure: transverse colectomy  Tumor Site:  transverse colon  Tumor Size: Greatest dimension: 2.3 cm  Macroscopic Tumor Perforation: not specified  Histologic Type: adenocarcinoma  Histologic Grade: G2, moderately differentiated  Tumor Extension: tumor invades the visceral peritoneum  Margins: all margins are uninvolved by invasive carcinoma, high-grade dysplasia, intramucosal adenocarcinoma, and adenoma  Treatment Effect: no known presurgical therapy  Lymphovascular Invasion: present  Perineural Invasion: present  Tumor Deposits: not identified   Regional Lymph Nodes: # examined: 16  # involved: 3  Pathologic Stage Classification (pTNM, AJCC 8th Edition): pT4a pN1b   ADDENDUM:   MICROSATELLITE INSTABILITY IMMUNOHISTOCHEMISTRY  MISMATCH REPAIR PROTEINS:   MLH1: Intact nuclear expression  MSH2: Intact nuclear expression  MSH6: Intact nuclear expression  PMS2: Intact nuclear expression   Interpretation: No loss of nuclear expression of mismatch repair  proteins: Low probability of MSI-H.    ASSESSMENT/PLAN 71 yo female who has MSI stable stage IIIB Colon Cancer starting adjuvant FOLFOX.   Cancer Staging Malignant neoplasm of transverse colon [facility]) Staging form: Colon and Rectum, AJCC 8th Edition - Clinical stage from 04/15/2017: Stage IIIB (cT4a, cN1b, cM0) - Signed by Earlie Server, MD on 04/26/2017  1. Malignant neoplasm of transverse colon ([place])   2. Iron deficiency anemia due to chronic blood loss   3. Encounter for antineoplastic chemotherapy    #Okay to start cycle 2 FOLFOX, Goal of care is curative intent.  # As she has T4 disease, after she finishes systemic chemotherapy, I think she would benefit from radiation to reduce local recurrence. Will refer to RadOnc after finishing adjuvant chemotherapy.  # baseline CEA is 2.1  # hypomagnesia: will get IV Magnesium Sulfate 4g today. Sent oral SLOW Mag 1 tablet daily to pharmacy.   # Anemia likely due to iron deficiency from previous chronic blood loss from tumor and blood loss from surgery. Improvinng. S/p 1 dose of IV venofer. Plan weekly for total of 4 doses.   No orders of the defined types were placed in this encounter.   All questions were answered. The patient knows to call the clinic with any problems, questions or concerns.  Follow up  in 2 weeks on day 1 of cycle 2  Dr. Earlie Server, MD, PhD [facility] at [facility] Pager- [phone] 06/10/2017

## 2017-06-12 ENCOUNTER — Inpatient Hospital Stay: Payer: Medicare HMO

## 2017-06-12 VITALS — BP 126/68 | HR 67 | Temp 96.2°F | Resp 20

## 2017-06-12 DIAGNOSIS — M25562 Pain in left knee: Secondary | ICD-10-CM | POA: Diagnosis not present

## 2017-06-12 DIAGNOSIS — C184 Malignant neoplasm of transverse colon: Secondary | ICD-10-CM

## 2017-06-12 DIAGNOSIS — M549 Dorsalgia, unspecified: Secondary | ICD-10-CM | POA: Diagnosis not present

## 2017-06-12 DIAGNOSIS — L03818 Cellulitis of other sites: Secondary | ICD-10-CM | POA: Diagnosis not present

## 2017-06-12 DIAGNOSIS — E876 Hypokalemia: Secondary | ICD-10-CM | POA: Diagnosis not present

## 2017-06-12 DIAGNOSIS — Z5111 Encounter for antineoplastic chemotherapy: Secondary | ICD-10-CM | POA: Diagnosis not present

## 2017-06-12 DIAGNOSIS — R110 Nausea: Secondary | ICD-10-CM | POA: Diagnosis not present

## 2017-06-12 DIAGNOSIS — D500 Iron deficiency anemia secondary to blood loss (chronic): Secondary | ICD-10-CM | POA: Diagnosis not present

## 2017-06-12 MED ORDER — HEPARIN SOD (PORK) LOCK FLUSH 100 UNIT/ML IV SOLN
INTRAVENOUS | Status: AC
  Filled 2017-06-12: qty 5

## 2017-06-12 MED ORDER — HEPARIN SOD (PORK) LOCK FLUSH 100 UNIT/ML IV SOLN
500.0000 [IU] | Freq: Once | INTRAVENOUS | Status: AC | PRN
Start: 2017-06-12 — End: 2017-06-12
  Administered 2017-06-12: 500 [IU]

## 2017-06-12 MED ORDER — SODIUM CHLORIDE 0.9% FLUSH
10.0000 mL | INTRAVENOUS | Status: DC | PRN
  Administered 2017-06-12: 10 mL
  Filled 2017-06-12: qty 10

## 2017-06-17 ENCOUNTER — Inpatient Hospital Stay: Payer: Medicare HMO

## 2017-06-17 DIAGNOSIS — C184 Malignant neoplasm of transverse colon: Secondary | ICD-10-CM | POA: Diagnosis not present

## 2017-06-17 DIAGNOSIS — M549 Dorsalgia, unspecified: Secondary | ICD-10-CM | POA: Diagnosis not present

## 2017-06-17 DIAGNOSIS — R110 Nausea: Secondary | ICD-10-CM | POA: Diagnosis not present

## 2017-06-17 DIAGNOSIS — Z5111 Encounter for antineoplastic chemotherapy: Secondary | ICD-10-CM | POA: Diagnosis not present

## 2017-06-17 DIAGNOSIS — E876 Hypokalemia: Secondary | ICD-10-CM | POA: Diagnosis not present

## 2017-06-17 DIAGNOSIS — D500 Iron deficiency anemia secondary to blood loss (chronic): Secondary | ICD-10-CM | POA: Diagnosis not present

## 2017-06-17 DIAGNOSIS — L03818 Cellulitis of other sites: Secondary | ICD-10-CM | POA: Diagnosis not present

## 2017-06-17 DIAGNOSIS — M25562 Pain in left knee: Secondary | ICD-10-CM | POA: Diagnosis not present

## 2017-06-17 MED ORDER — HEPARIN SOD (PORK) LOCK FLUSH 100 UNIT/ML IV SOLN
INTRAVENOUS | Status: AC
  Filled 2017-06-17: qty 5

## 2017-06-17 MED ORDER — HEPARIN SOD (PORK) LOCK FLUSH 100 UNIT/ML IV SOLN
500.0000 [IU] | Freq: Once | INTRAVENOUS | Status: AC
  Administered 2017-06-17: 500 [IU] via INTRAVENOUS

## 2017-06-17 MED ORDER — SODIUM CHLORIDE 0.9% FLUSH
10.0000 mL | INTRAVENOUS | Status: DC | PRN
  Administered 2017-06-17: 10 mL via INTRAVENOUS
  Filled 2017-06-17: qty 10

## 2017-06-17 MED ORDER — SODIUM CHLORIDE 0.9 % IV SOLN
Freq: Once | INTRAVENOUS | Status: AC
Start: 2017-06-17 — End: 2017-06-17
  Administered 2017-06-17: 15:00:00 via INTRAVENOUS
  Filled 2017-06-17: qty 1000

## 2017-06-17 MED ORDER — IRON SUCROSE 20 MG/ML IV SOLN
200.0000 mg | Freq: Once | INTRAVENOUS | Status: AC
  Administered 2017-06-17: 200 mg via INTRAVENOUS
  Filled 2017-06-17: qty 10

## 2017-06-18 DIAGNOSIS — Z483 Aftercare following surgery for neoplasm: Secondary | ICD-10-CM | POA: Diagnosis not present

## 2017-06-18 DIAGNOSIS — E785 Hyperlipidemia, unspecified: Secondary | ICD-10-CM | POA: Diagnosis not present

## 2017-06-18 DIAGNOSIS — M5136 Other intervertebral disc degeneration, lumbar region: Secondary | ICD-10-CM | POA: Diagnosis not present

## 2017-06-18 DIAGNOSIS — M179 Osteoarthritis of knee, unspecified: Secondary | ICD-10-CM | POA: Diagnosis not present

## 2017-06-18 DIAGNOSIS — G629 Polyneuropathy, unspecified: Secondary | ICD-10-CM | POA: Diagnosis not present

## 2017-06-18 DIAGNOSIS — C189 Malignant neoplasm of colon, unspecified: Secondary | ICD-10-CM | POA: Diagnosis not present

## 2017-06-18 DIAGNOSIS — Z433 Encounter for attention to colostomy: Secondary | ICD-10-CM | POA: Diagnosis not present

## 2017-06-18 DIAGNOSIS — F17210 Nicotine dependence, cigarettes, uncomplicated: Secondary | ICD-10-CM | POA: Diagnosis not present

## 2017-06-18 DIAGNOSIS — G8929 Other chronic pain: Secondary | ICD-10-CM | POA: Diagnosis not present

## 2017-06-19 DIAGNOSIS — C189 Malignant neoplasm of colon, unspecified: Secondary | ICD-10-CM | POA: Diagnosis not present

## 2017-06-19 DIAGNOSIS — Z933 Colostomy status: Secondary | ICD-10-CM | POA: Diagnosis not present

## 2017-06-23 ENCOUNTER — Other Ambulatory Visit: Payer: Self-pay | Admitting: Oncology

## 2017-06-24 ENCOUNTER — Inpatient Hospital Stay: Payer: Medicare HMO

## 2017-06-24 ENCOUNTER — Inpatient Hospital Stay (HOSPITAL_BASED_OUTPATIENT_CLINIC_OR_DEPARTMENT_OTHER): Payer: Medicare HMO | Admitting: Oncology

## 2017-06-24 ENCOUNTER — Encounter: Payer: Self-pay | Admitting: Oncology

## 2017-06-24 VITALS — BP 108/65 | HR 72 | Temp 97.7°F | Wt 143.6 lb

## 2017-06-24 DIAGNOSIS — Z800 Family history of malignant neoplasm of digestive organs: Secondary | ICD-10-CM

## 2017-06-24 DIAGNOSIS — K219 Gastro-esophageal reflux disease without esophagitis: Secondary | ICD-10-CM | POA: Diagnosis not present

## 2017-06-24 DIAGNOSIS — Z853 Personal history of malignant neoplasm of breast: Secondary | ICD-10-CM

## 2017-06-24 DIAGNOSIS — C184 Malignant neoplasm of transverse colon: Secondary | ICD-10-CM

## 2017-06-24 DIAGNOSIS — L03818 Cellulitis of other sites: Secondary | ICD-10-CM

## 2017-06-24 DIAGNOSIS — D500 Iron deficiency anemia secondary to blood loss (chronic): Secondary | ICD-10-CM

## 2017-06-24 DIAGNOSIS — E876 Hypokalemia: Secondary | ICD-10-CM

## 2017-06-24 DIAGNOSIS — M810 Age-related osteoporosis without current pathological fracture: Secondary | ICD-10-CM

## 2017-06-24 DIAGNOSIS — M549 Dorsalgia, unspecified: Secondary | ICD-10-CM | POA: Diagnosis not present

## 2017-06-24 DIAGNOSIS — G629 Polyneuropathy, unspecified: Secondary | ICD-10-CM

## 2017-06-24 DIAGNOSIS — M25562 Pain in left knee: Secondary | ICD-10-CM | POA: Diagnosis not present

## 2017-06-24 DIAGNOSIS — M25561 Pain in right knee: Secondary | ICD-10-CM

## 2017-06-24 DIAGNOSIS — R110 Nausea: Secondary | ICD-10-CM

## 2017-06-24 DIAGNOSIS — R599 Enlarged lymph nodes, unspecified: Secondary | ICD-10-CM

## 2017-06-24 DIAGNOSIS — D3501 Benign neoplasm of right adrenal gland: Secondary | ICD-10-CM

## 2017-06-24 DIAGNOSIS — E559 Vitamin D deficiency, unspecified: Secondary | ICD-10-CM

## 2017-06-24 DIAGNOSIS — E785 Hyperlipidemia, unspecified: Secondary | ICD-10-CM

## 2017-06-24 DIAGNOSIS — Z79899 Other long term (current) drug therapy: Secondary | ICD-10-CM

## 2017-06-24 DIAGNOSIS — G8929 Other chronic pain: Secondary | ICD-10-CM | POA: Diagnosis not present

## 2017-06-24 DIAGNOSIS — M1990 Unspecified osteoarthritis, unspecified site: Secondary | ICD-10-CM

## 2017-06-24 DIAGNOSIS — D509 Iron deficiency anemia, unspecified: Secondary | ICD-10-CM

## 2017-06-24 DIAGNOSIS — Z87891 Personal history of nicotine dependence: Secondary | ICD-10-CM

## 2017-06-24 DIAGNOSIS — T451X5A Adverse effect of antineoplastic and immunosuppressive drugs, initial encounter: Secondary | ICD-10-CM

## 2017-06-24 DIAGNOSIS — M5136 Other intervertebral disc degeneration, lumbar region: Secondary | ICD-10-CM

## 2017-06-24 DIAGNOSIS — Z923 Personal history of irradiation: Secondary | ICD-10-CM

## 2017-06-24 DIAGNOSIS — Z5111 Encounter for antineoplastic chemotherapy: Secondary | ICD-10-CM | POA: Diagnosis not present

## 2017-06-24 DIAGNOSIS — I700 Atherosclerosis of aorta: Secondary | ICD-10-CM

## 2017-06-24 DIAGNOSIS — G620 Drug-induced polyneuropathy: Secondary | ICD-10-CM

## 2017-06-24 DIAGNOSIS — D3502 Benign neoplasm of left adrenal gland: Secondary | ICD-10-CM

## 2017-06-24 LAB — CBC WITH DIFFERENTIAL/PLATELET
BASOS PCT: 2 %
Basophils Absolute: 0.2 10*3/uL — ABNORMAL HIGH (ref 0–0.1)
EOS ABS: 0.2 10*3/uL (ref 0–0.7)
Eosinophils Relative: 3 %
HCT: 35.5 % (ref 35.0–47.0)
Hemoglobin: 12.2 g/dL (ref 12.0–16.0)
LYMPHS ABS: 2.3 10*3/uL (ref 1.0–3.6)
Lymphocytes Relative: 32 %
MCH: 31.9 pg (ref 26.0–34.0)
MCHC: 34.4 g/dL (ref 32.0–36.0)
MCV: 92.7 fL (ref 80.0–100.0)
MONO ABS: 1.7 10*3/uL — AB (ref 0.2–0.9)
MONOS PCT: 23 %
Neutro Abs: 3 10*3/uL (ref 1.4–6.5)
Neutrophils Relative %: 40 %
Platelets: 171 10*3/uL (ref 150–440)
RBC: 3.83 MIL/uL (ref 3.80–5.20)
RDW: 18.7 % — AB (ref 11.5–14.5)
WBC: 7.4 10*3/uL (ref 3.6–11.0)

## 2017-06-24 LAB — COMPREHENSIVE METABOLIC PANEL
ALBUMIN: 3.2 g/dL — AB (ref 3.5–5.0)
ALT: 15 U/L (ref 14–54)
ANION GAP: 9 (ref 5–15)
AST: 22 U/L (ref 15–41)
Alkaline Phosphatase: 76 U/L (ref 38–126)
BILIRUBIN TOTAL: 0.3 mg/dL (ref 0.3–1.2)
BUN: 8 mg/dL (ref 6–20)
CALCIUM: 8.4 mg/dL — AB (ref 8.9–10.3)
CO2: 23 mmol/L (ref 22–32)
Chloride: 106 mmol/L (ref 101–111)
Creatinine, Ser: 0.75 mg/dL (ref 0.44–1.00)
GFR calc non Af Amer: >60 mL/min (ref >60)
GLUCOSE: 112 mg/dL — AB (ref 65–99)
POTASSIUM: 3.4 mmol/L — AB (ref 3.5–5.1)
SODIUM: 138 mmol/L (ref 135–145)
TOTAL PROTEIN: 6.6 g/dL (ref 6.5–8.1)

## 2017-06-24 LAB — MAGNESIUM: MAGNESIUM: 1.3 mg/dL — AB (ref 1.7–2.4)

## 2017-06-24 MED ORDER — IRON SUCROSE 20 MG/ML IV SOLN
200.0000 mg | Freq: Once | INTRAVENOUS | Status: AC
  Administered 2017-06-24: 200 mg via INTRAVENOUS
  Filled 2017-06-24: qty 10

## 2017-06-24 MED ORDER — SODIUM CHLORIDE 0.9 % IV SOLN
Freq: Once | INTRAVENOUS | Status: AC
  Administered 2017-06-24: 09:00:00 via INTRAVENOUS
  Filled 2017-06-24: qty 1000

## 2017-06-24 MED ORDER — PALONOSETRON HCL INJECTION 0.25 MG/5ML
0.2500 mg | Freq: Once | INTRAVENOUS | Status: AC
  Administered 2017-06-24: 0.25 mg via INTRAVENOUS
  Filled 2017-06-24: qty 5

## 2017-06-24 MED ORDER — LEUCOVORIN CALCIUM INJECTION 350 MG
700.0000 mg | Freq: Once | INTRAVENOUS | Status: AC
  Administered 2017-06-24: 700 mg via INTRAVENOUS
  Filled 2017-06-24: qty 25

## 2017-06-24 MED ORDER — FLUOROURACIL CHEMO INJECTION 5 GM/100ML
2400.0000 mg/m2 | INTRAVENOUS | Status: DC
  Administered 2017-06-24: 4200 mg via INTRAVENOUS
  Filled 2017-06-24: qty 84

## 2017-06-24 MED ORDER — OXALIPLATIN CHEMO INJECTION 100 MG/20ML
75.0000 mg/m2 | Freq: Once | INTRAVENOUS | Status: AC
  Administered 2017-06-24: 130 mg via INTRAVENOUS
  Filled 2017-06-24: qty 26

## 2017-06-24 MED ORDER — FLUOROURACIL CHEMO INJECTION 2.5 GM/50ML
400.0000 mg/m2 | Freq: Once | INTRAVENOUS | Status: AC
  Administered 2017-06-24: 700 mg via INTRAVENOUS
  Filled 2017-06-24: qty 14

## 2017-06-24 MED ORDER — DEXAMETHASONE SODIUM PHOSPHATE 10 MG/ML IJ SOLN
10.0000 mg | Freq: Once | INTRAMUSCULAR | Status: AC
  Administered 2017-06-24: 10 mg via INTRAVENOUS
  Filled 2017-06-24: qty 1

## 2017-06-24 MED ORDER — MAGNESIUM CHLORIDE 64 MG PO TBEC: 1.0000 | DELAYED_RELEASE_TABLET | Freq: Every day | ORAL | 3 refills | Status: DC

## 2017-06-24 MED ORDER — DEXTROSE 5 % IV SOLN
Freq: Once | INTRAVENOUS | Status: AC
  Administered 2017-06-24: 12:00:00 via INTRAVENOUS
  Filled 2017-06-24: qty 1000

## 2017-06-24 MED ORDER — SODIUM CHLORIDE 0.9 % IV SOLN
4.0000 g | Freq: Once | INTRAVENOUS | Status: DC
Start: 2017-06-24 — End: 2017-06-24

## 2017-06-24 MED ORDER — VITAMIN B-6 50 MG PO TABS: 50.0000 mg | ORAL_TABLET | Freq: Every day | ORAL | 3 refills | Status: DC

## 2017-06-24 MED ORDER — MAGNESIUM SULFATE 4 GM/100ML IV SOLN
4.0000 g | Freq: Once | INTRAVENOUS | Status: AC
  Administered 2017-06-24: 4 g via INTRAVENOUS
  Filled 2017-06-24: qty 100

## 2017-06-24 MED ORDER — POTASSIUM CHLORIDE 20 MEQ/100ML IV SOLN
20.0000 meq | Freq: Once | INTRAVENOUS | Status: AC
  Administered 2017-06-24: 20 meq via INTRAVENOUS
  Filled 2017-06-24: qty 100

## 2017-06-24 NOTE — Progress Notes (Signed)
Patient here today for follow up.  Patient states no new concerns today  

## 2017-06-24 NOTE — Progress Notes (Signed)
[facility] Initial Visit:  Patient Care Team: Birdie Sons, MD as PCP - General (Family Medicine)  PURPOSE OF CONSULTATION: Adjuvant chemotherapy for colon cancer  HISTORY OF PRESENTING ILLNESS: Kimberly [name] 71 y.o. female with PMH listed as below is here for evaluation of colon cancer and management.  Patient reports a remote history of breast cancer s/p lumpectomy and radiation treatments. Patient had been having abdominal pain, weight loss and bloating.  CT scan showed partial obstruction at the level of transverse colon and ileocolic mesenteric adenopathy. She was prepared for a colonoscopy on 04/15/2017. She started to have worsened abdominal pain, nausea vomiting, unable to maintain oral intake and was sent to ED. She was found to have bowel obstruction and underwent  exploratory laparotomy with transverse colectomy, with end colostomy and mucous fistula formation for obstructing colon lesion. Differential diagnosis prior to surgery was metastatic breast cancer in colon vs colon cancer. The patient did have a colonoscopy 2 years ago without the mass being present at that time but did have 2 small polyps in the transverse colon that were removed.  04/15/2017 Patient underwent transverse colon resection.  Pathology showed T4aN1 moderate differentiated adenocarcinoma, negative margin, 3/16 lymph nodes involved with cancer.  04/21/2017 She was seen in the ER earlier this week due to colostomy wound infection lateral to her right-sided end colostomy and to the midsection of her midline incision. She was started on antibiotics for cellulitis   INTERVAL HISTORY Patient presents for evaluation prior to her cycle 3 FOLFOX.  She tolerated first 2 cycles pretty well. She has mild nausea which is relieved by antiemetics. No diarrhea. She reports feeling well. She reports intermittent finger tingling and numbness, as well as toes. She has a history of neuropathy on her toes, which is  slightly worse now.   Review of Systems  Constitutional: Negative.   HENT:  Negative.   Eyes: Negative.   Respiratory: Negative.   Cardiovascular: Negative.   Gastrointestinal: Negative.   Endocrine: Negative.   Genitourinary: Negative.    Musculoskeletal: Negative.   Neurological:       Intermittent tingling and numbness with fingers and toes.   Hematological: Negative.   Psychiatric/Behavioral: Negative.     MEDICAL HISTORY: Past Medical History:  Diagnosis Date  . Breast cancer ([place])   . Breast cancer, left (HCC)    Lumpectomy and rad tx's.  . Chronic back pain   . Chronic knee pain   . Colon cancer ([place])   . Degenerative disc disease, lumbar 04/2013  . Dyspnea   . Family history of adverse reaction to anesthesia    son arrested after anesthesia  . GERD (gastroesophageal reflux disease)   . Hyperlipidemia   . Iron deficiency anemia 05/27/2017  . Neuropathy   . Osteoarthritis    of knee  . Osteoporosis   . Tobacco abuse   . Vitamin D deficiency     SURGICAL HISTORY: Past Surgical History:  Procedure Laterality Date  . APPENDECTOMY  1965  . BREAST EXCISIONAL BIOPSY Left 08/01/2003   lumpectomy rad 11/04-2/28/2005  . CESAREAN SECTION     x3  . COLON SURGERY    . COLONOSCOPY W/ POLYPECTOMY    . ESOPHAGOGASTRODUODENOSCOPY (EGD) WITH PROPOFOL N/A 04/04/2017   Procedure: ESOPHAGOGASTRODUODENOSCOPY (EGD) WITH PROPOFOL;  Surgeon: [name], MD;  Location: [place];  Service: Endoscopy;  Laterality: N/A;  . HIP FRACTURE SURGERY Left 01/25/2012  . LAPAROTOMY N/A 04/15/2017   Procedure: EXPLORATORY LAPAROTOMY;  Surgeon: Clayburn Pert, MD;  Location: [facility];  Service: General;  Laterality: N/A;  . NECK SURGERY  12/2011  . PORTACATH PLACEMENT Right 05/21/2017   Procedure: INSERTION PORT-A-CATH;  Surgeon: Clayburn Pert, MD;  Location: [facility];  Service: General;  Laterality: Right;  . WRIST FRACTURE SURGERY      SOCIAL HISTORY: Social History    Social History  . Marital status: Divorced    Spouse name: N/A  . Number of children: N/A  . Years of education: N/A   Occupational History  . Not on file.   Social History Main Topics  . Smoking status: Former Smoker    Packs/day: 0.25    Years: 55.00    Types: Cigarettes    Quit date: 05/21/2017  . Smokeless tobacco: Never Used     Comment: started age 41 1/2 to 1 ppd  . Alcohol use 0.0 oz/week     Comment: occasionally drinks beer  . Drug use: No  . Sexual activity: No   Other Topics Concern  . Not on file   Social History Narrative  . No narrative on file    FAMILY HISTORY Family History  Problem Relation Age of Onset  . Diabetes Mother        type 2  . Coronary artery disease Mother   . Deep vein thrombosis Mother   . Colon cancer Father   . Asthma Brother   . Diabetes Brother        type 2    ALLERGIES:  is allergic to other; sinus formula [cholestatin]; and iodine.  MEDICATIONS:  Current Outpatient Prescriptions  Medication Sig Dispense Refill  . budesonide-formoterol (SYMBICORT) 80-4.5 MCG/ACT inhaler Inhale 2 puffs into the lungs 2 (two) times daily.    . Calcium Citrate-Vitamin D (CITRACAL/VITAMIN D PO) Take 1 tablet by mouth daily.     . feeding supplement (BOOST / RESOURCE BREEZE) LIQD Take 1 Container by mouth 3 (three) times daily between meals. (Patient taking differently: Take 1 Container by mouth 2 (two) times daily. ) 30 Container 0  . furosemide (LASIX) 20 MG tablet Take 1 tablet (20 mg total) by mouth daily. 30 tablet 3  . HYDROcodone-acetaminophen (NORCO) 7.5-325 MG tablet Take 1-2 tablets by mouth every 6 (six) hours as needed for moderate pain. 120 tablet 0  . ibuprofen (ADVIL,MOTRIN) 200 MG tablet Take 400 mg by mouth daily as needed for headache or moderate pain.    Marland Kitchen lidocaine-prilocaine (EMLA) cream Apply to affected area once 30 g 3  . LORazepam (ATIVAN) 0.5 MG tablet Take 1 tablet (0.5 mg total) by mouth every 6 (six) hours as  needed (Nausea or vomiting). 30 tablet 0  . magnesium chloride (SLOW-MAG) 64 MG TBEC SR tablet Take 1 tablet (64 mg total) by mouth daily. 30 tablet 0  . meloxicam (MOBIC) 15 MG tablet TAKE 1 TABLET EVERY DAY AS NEEDED (Patient taking differently: take 44m by mouth daily) 90 tablet 3  . omeprazole (PRILOSEC) 20 MG capsule TAKE 1 CAPSULE EVERY DAY 90 capsule 4  . ondansetron (ZOFRAN) 8 MG tablet Take 1 tablet (8 mg total) by mouth 2 (two) times daily as needed for refractory nausea / vomiting. Start on day 3 after chemotherapy. 30 tablet 1  . pravastatin (PRAVACHOL) 40 MG tablet TAKE 1 TABLET (40 MG TOTAL) BY MOUTH DAILY. 90 tablet 4  . Probiotic Product (PROBIOTIC DAILY PO) Take 1 capsule by mouth daily.     . prochlorperazine (COMPAZINE) 10 MG tablet Take  1 tablet (10 mg total) by mouth every 6 (six) hours as needed (Nausea or vomiting). 30 tablet 1  . simethicone (MYLICON) 80 MG chewable tablet Chew 2 tablets (160 mg total) by mouth every 6 (six) hours as needed for flatulence. 120 tablet 0  . Triamcinolone Acetonide (NASACORT ALLERGY 24HR NA) Place 1 spray into the nose daily as needed (allergies).    . varenicline (CHANTIX CONTINUING MONTH PAK) 1 MG tablet Take 1 tablet (1 mg total) by mouth 2 (two) times daily. 60 tablet 2   No current facility-administered medications for this visit.    Facility-Administered Medications Ordered in Other Visits  Medication Dose Route Frequency Provider Last Rate Last Dose  . heparin lock flush 100 unit/mL  500 Units Intravenous Once Earlie Server, MD      . heparin lock flush 100 unit/mL  500 Units Intravenous Once Earlie Server, MD      . sodium chloride flush (NS) 0.9 % injection 10 mL  10 mL Intracatheter PRN Earlie Server, MD      . sodium chloride flush (NS) 0.9 % injection 10 mL  10 mL Intravenous PRN Earlie Server, MD   10 mL at 05/29/17 1347    PHYSICAL EXAMINATION:  ECOG PERFORMANCE STATUS: 0 - Asymptomatic  Vitals:   06/24/17 0842  BP: 108/65  Pulse: 72   Temp: 97.7 F (36.5 C)   Filed Weights   06/24/17 0842  Weight: 143 lb 9 oz (65.1 kg)    Physical Exam GENERAL: No distress, well nourished.  SKIN:  No rashes or significant lesions  HEAD: Normocephalic, No masses, lesions, tenderness or abnormalities  EYES: Conjunctiva are pink, non icteric ENT: External ears normal ,lips , buccal mucosa, and tongue normal and mucous membranes are moist  LYMPH: No palpable cervical and axillary lymphadenopathy  LUNGS: Clear to auscultation, no crackles or wheezes HEART: Regular rate & rhythm, no murmurs, no gallops, S1 normal and S2 normal  ABDOMEN: Abdomen soft, normal bowel sounds, I did not appreciate any  masses or organomegaly. surgical scar covered with dressing, + colostomy.  MUSCULOSKELETAL: No CVA tenderness and no tenderness on percussion of the back or rib cage.  EXTREMITIES: No edema, no skin discoloration or tenderness NEURO: Alert & oriented, no focal motor/sensory deficits.   LABORATORY DATA: I have personally reviewed the data as listed: CBC    Component Value Date/Time   WBC 8.9 06/10/2017 0908   RBC 3.74 (L) 06/10/2017 0908   HGB 11.6 (L) 06/10/2017 0908   HGB 12.2 01/02/2017 1410   HCT 33.7 (L) 06/10/2017 0908   HCT 37.3 01/02/2017 1410   PLT 320 06/10/2017 0908   PLT 486 (H) 01/02/2017 1410   MCV 90.1 06/10/2017 0908   MCV 86 01/02/2017 1410   MCV 96 01/29/2012 0428   MCH 31.1 06/10/2017 0908   MCHC 34.5 06/10/2017 0908   RDW 17.0 (H) 06/10/2017 0908   RDW 16.1 (H) 01/02/2017 1410   RDW 12.7 01/29/2012 0428   LYMPHSABS 2.4 06/10/2017 0908   LYMPHSABS 3.9 (H) 01/02/2017 1410   LYMPHSABS 1.9 01/29/2012 0428   MONOABS 1.3 (H) 06/10/2017 0908   MONOABS 2.0 (H) 01/29/2012 0428   EOSABS 0.3 06/10/2017 0908   EOSABS 0.4 01/02/2017 1410   EOSABS 0.1 01/29/2012 0428   BASOSABS 0.2 (H) 06/10/2017 0908   BASOSABS 0.1 01/02/2017 1410   BASOSABS 0.1 01/29/2012 0428   CMP Latest Ref Rng & Units 06/10/2017 05/29/2017  05/27/2017  Glucose 65 - 99 mg/dL 129(H) -  84  BUN 6 - 20 mg/dL 9 - 10  Creatinine 0.44 - 1.00 mg/dL 0.73 - 0.56  Sodium 135 - 145 mmol/L 135 - 136  Potassium 3.5 - 5.1 mmol/L 3.7 5.1 3.0(L)  Chloride 101 - 111 mmol/L 105 - 101  CO2 22 - 32 mmol/L 24 - 25  Calcium 8.9 - 10.3 mg/dL 8.5(L) - 8.6(L)  Total Protein 6.5 - 8.1 g/dL 6.4(L) - 7.1  Total Bilirubin 0.3 - 1.2 mg/dL 0.4 - 0.4  Alkaline Phos 38 - 126 U/L 58 - 61  AST 15 - 41 U/L 20 - 16  ALT 14 - 54 U/L 12(L) - 10(L)    RADIOGRAPHIC STUDIES: I have personally reviewed the radiological images as listed and agree with the findings in the report 04/08/2017 CT abdomen pelvis w contrast IMPRESSION: 1. Partial obstruction at the level of the transverse colon, likely secondary to carcinoma. 2. Ileocolic mesenteric adenopathy, suspicious for nodal metastasis. 3.  Aortic Atherosclerosis (ICD10-I70.0). 4. Bilateral adrenal adenomas. 04/21/2017 CT abdomen pelvis with contrast. IMPRESSION: Postsurgical changes consistent with the given clinical history. A small amount of fluid in a year is noted surrounding the right lower quadrant ostomy likely felt to be postoperative in nature. No definitive abscess is seen. The patient's palpable abnormality likely corresponds to the small fluid collection.  PATHOLOGY DIAGNOSIS:  A. COLON MASS, TRANSVERSE; TRANSVERSE COLECTOMY:  - INVASIVE ADENOCARCINOMA, MODERATELY DIFFERENTIATED.  - THREE OF SIXTEEN LYMPH NODES INVOLVED BY METASTASIS (3/16).  - LYMPHOVASCULAR AND PERINEURAL INVASION PRESENT.  - SEE SUMMARY BELOW.  Surgical Pathology Cancer Case Summary  COLON AND RECTUM:  Procedure: transverse colectomy  Tumor Site:  transverse colon  Tumor Size: Greatest dimension: 2.3 cm  Macroscopic Tumor Perforation: not specified  Histologic Type: adenocarcinoma  Histologic Grade: G2, moderately differentiated  Tumor Extension: tumor invades the visceral peritoneum  Margins: all margins are  uninvolved by invasive carcinoma, high-grade dysplasia, intramucosal adenocarcinoma, and adenoma  Treatment Effect: no known presurgical therapy  Lymphovascular Invasion: present  Perineural Invasion: present  Tumor Deposits: not identified  Regional Lymph Nodes: # examined: 16  # involved: 3  Pathologic Stage Classification (pTNM, AJCC 8th Edition): pT4a pN1b   ADDENDUM:   MICROSATELLITE INSTABILITY IMMUNOHISTOCHEMISTRY  MISMATCH REPAIR PROTEINS:   MLH1: Intact nuclear expression  MSH2: Intact nuclear expression  MSH6: Intact nuclear expression  PMS2: Intact nuclear expression   Interpretation: No loss of nuclear expression of mismatch repair  proteins: Low probability of MSI-H.    ASSESSMENT/PLAN 71 yo female who has MSI stable stage IIIB Colon Cancer starting adjuvant FOLFOX. # baseline CEA is 2.1  Cancer Staging Malignant neoplasm of transverse colon [facility]) Staging form: Colon and Rectum, AJCC 8th Edition - Clinical stage from 04/15/2017: Stage IIIB (cT4a, cN1b, cM0) - Signed by Earlie Server, MD on 04/26/2017  1. Malignant neoplasm of transverse colon ([place])   2. Iron deficiency anemia due to chronic blood loss   3. Hypomagnesemia   4. Encounter for antineoplastic chemotherapy   5. Personal history of malignant neoplasm of breast   6. Hypokalemia   7. Chemotherapy-induced peripheral neuropathy ([place])    #Okay to start cycle 3 FOLFOX, Goal of care is curative intent. (Reduce oxlaiplatin dose to '75mg'$ /m2 due to neuropathy. # As she has T4 disease, after she finishes systemic chemotherapy, I think she would benefit from radiation to reduce local recurrence. Will refer to RadOnc after finishing adjuvant chemotherapy.   # hypomagnesia: will get IV Magnesium Sulfate 4g today.Continue oral SLOW Mag 1  tablet daily to pharmacy.  # Hypokalemia: IV KCL 53mq today.  # Anemia likely due to iron deficiency from previous chronic blood loss from tumor and blood loss from surgery. Improvinng.  S/p 3 dose of IV venofer. Plan weekly for total of 4 doses.  # Consider genetic testing.  # Grade 1 neuropathy: She tried Neurontin in the past for preexisting neuropathy and has to stop due to memory loss.  Send trial of B6 571mdaily.   No orders of the defined types were placed in this encounter.   All questions were answered. The patient knows to call the clinic with any problems, questions or concerns.  Follow up in 2 weeks on day 1 of cycle 4  Dr. ZhEarlie ServerMD, PhD [facility] Center[facility]- [phone]/25/2018

## 2017-06-26 ENCOUNTER — Inpatient Hospital Stay: Payer: Medicare HMO

## 2017-06-26 VITALS — BP 110/69 | HR 67 | Temp 95.8°F | Resp 20

## 2017-06-26 DIAGNOSIS — C184 Malignant neoplasm of transverse colon: Secondary | ICD-10-CM | POA: Diagnosis not present

## 2017-06-26 DIAGNOSIS — M549 Dorsalgia, unspecified: Secondary | ICD-10-CM | POA: Diagnosis not present

## 2017-06-26 DIAGNOSIS — Z5111 Encounter for antineoplastic chemotherapy: Secondary | ICD-10-CM | POA: Diagnosis not present

## 2017-06-26 DIAGNOSIS — E876 Hypokalemia: Secondary | ICD-10-CM | POA: Diagnosis not present

## 2017-06-26 DIAGNOSIS — R110 Nausea: Secondary | ICD-10-CM | POA: Diagnosis not present

## 2017-06-26 DIAGNOSIS — D500 Iron deficiency anemia secondary to blood loss (chronic): Secondary | ICD-10-CM | POA: Diagnosis not present

## 2017-06-26 DIAGNOSIS — L03818 Cellulitis of other sites: Secondary | ICD-10-CM | POA: Diagnosis not present

## 2017-06-26 DIAGNOSIS — M25562 Pain in left knee: Secondary | ICD-10-CM | POA: Diagnosis not present

## 2017-06-26 MED ORDER — SODIUM CHLORIDE 0.9% FLUSH
10.0000 mL | Freq: Once | INTRAVENOUS | Status: AC
  Administered 2017-06-26: 10 mL via INTRAVENOUS
  Filled 2017-06-26: qty 10

## 2017-06-26 MED ORDER — HEPARIN SOD (PORK) LOCK FLUSH 100 UNIT/ML IV SOLN
500.0000 [IU] | Freq: Once | INTRAVENOUS | Status: AC
  Administered 2017-06-26: 500 [IU] via INTRAVENOUS
  Filled 2017-06-26: qty 5

## 2017-06-28 ENCOUNTER — Other Ambulatory Visit: Payer: Self-pay | Admitting: Oncology

## 2017-06-28 DIAGNOSIS — C184 Malignant neoplasm of transverse colon: Secondary | ICD-10-CM | POA: Diagnosis not present

## 2017-07-01 ENCOUNTER — Other Ambulatory Visit: Payer: Self-pay

## 2017-07-02 ENCOUNTER — Other Ambulatory Visit: Payer: Self-pay | Admitting: *Deleted

## 2017-07-02 ENCOUNTER — Other Ambulatory Visit: Payer: Self-pay | Admitting: Family Medicine

## 2017-07-02 DIAGNOSIS — M5136 Other intervertebral disc degeneration, lumbar region: Secondary | ICD-10-CM

## 2017-07-02 NOTE — Telephone Encounter (Signed)
This was taken off her medicine list. Please advise if it is to be refilled

## 2017-07-03 ENCOUNTER — Encounter: Payer: Self-pay | Admitting: General Surgery

## 2017-07-03 ENCOUNTER — Ambulatory Visit (INDEPENDENT_AMBULATORY_CARE_PROVIDER_SITE_OTHER): Payer: Medicare HMO | Admitting: General Surgery

## 2017-07-03 VITALS — BP 103/73 | HR 89 | Temp 97.2°F | Ht 60.0 in | Wt 140.8 lb

## 2017-07-03 DIAGNOSIS — C184 Malignant neoplasm of transverse colon: Secondary | ICD-10-CM

## 2017-07-03 NOTE — Progress Notes (Signed)
Outpatient Surgical Follow Up  07/03/2017  Kimberly [name] is an 71 y.o. female.   Chief Complaint  Patient presents with  . Routine Post Op    Laparotomy with Transverse Colectomy, Colostomy, and Mucous Fistula Creation (04/15/17)- Dr. Adonis Huguenin    HPI: 71 year old female returns to clinic for follow-up now 10 weeks status post export her laparotomy with transverse colectomy with end colostomy mucus fistula creation. Patient reports she is been started on chemotherapy and is tolerating it well. Has some nausea and loss of appetite from the chemotherapy but denies any abdominal pain and states her ostomy is functioning well. She denies any current fevers, chills, chest pain, shortness of breath.  Past Medical History:  Diagnosis Date  . Breast cancer ([place])   . Breast cancer, left (HCC)    Lumpectomy and rad tx's.  . Chronic back pain   . Chronic knee pain   . Colon cancer ([place])   . Degenerative disc disease, lumbar 04/2013  . Dyspnea   . Family history of adverse reaction to anesthesia    son arrested after anesthesia  . GERD (gastroesophageal reflux disease)   . Hyperlipidemia   . Iron deficiency anemia 05/27/2017  . Neuropathy   . Osteoarthritis    of knee  . Osteoporosis   . Tobacco abuse   . Vitamin D deficiency     Past Surgical History:  Procedure Laterality Date  . APPENDECTOMY  1965  . BREAST EXCISIONAL BIOPSY Left 08/01/2003   lumpectomy rad 11/04-2/28/2005  . CESAREAN SECTION     x3  . COLON SURGERY    . COLONOSCOPY W/ POLYPECTOMY    . ESOPHAGOGASTRODUODENOSCOPY (EGD) WITH PROPOFOL N/A 04/04/2017   Procedure: ESOPHAGOGASTRODUODENOSCOPY (EGD) WITH PROPOFOL;  Surgeon: [name], MD;  Location: [place];  Service: Endoscopy;  Laterality: N/A;  . HIP FRACTURE SURGERY Left 01/25/2012  . LAPAROTOMY N/A 04/15/2017   Procedure: EXPLORATORY LAPAROTOMY;  Surgeon: Clayburn Pert, MD;  Location: [facility];  Service: General;  Laterality: N/A;  . NECK SURGERY   12/2011  . PORTACATH PLACEMENT Right 05/21/2017   Procedure: INSERTION PORT-A-CATH;  Surgeon: Clayburn Pert, MD;  Location: [facility];  Service: General;  Laterality: Right;  . WRIST FRACTURE SURGERY      Family History  Problem Relation Age of Onset  . Diabetes Mother        type 2  . Coronary artery disease Mother   . Deep vein thrombosis Mother   . Colon cancer Father   . Asthma Brother   . Diabetes Brother        type 2    Social History:  reports that she quit smoking about 6 weeks ago. Her smoking use included Cigarettes. She has a 13.75 pack-year smoking history. She has never used smokeless tobacco. She reports that she drinks alcohol. She reports that she does not use drugs.  Allergies:  Allergies  Allergen Reactions  . Iodine Other (See Comments)    Topical iodine she states "if you put it in an open sore it will cause a eating ulcer."  . Other Nausea And Vomiting    Antihystamines  . Sinus Formula [Cholestatin] Nausea Only    Medications reviewed.    ROS A multipoint review of systems was completed, all pertinent positives and negatives are documented within the history of present illness and remainder are negative   BP 103/73   Pulse 89   Temp (!) 97.2 F (36.2 C) (Oral)   Ht 5' (1.524 m)  Wt 63.9 kg (140 lb 12.8 oz)   BMI 27.50 kg/m   Physical Exam Gen.: No acute distress Chest: Clear to auscultation  heart: Regular rate and rhythm Abdomen: Soft, nontender, nondistended. End colostomy and the right upper quadrant pink, patent, productive of gas and stool. Mucous fistula in the left upper quadrant with healthy-appearing mucosa. Well-healed midline incision.    No results found for this or any previous visit (from the past 48 hour(s)). No results found.  Assessment/Plan:  1. Malignant neoplasm of transverse colon [facility]) 71 year old female with advanced stage colorectal cancer. Doing well from a postsurgical standpoint. She is in the middle of  chemotherapy and has planned creation therapy to the abdomen for the first of the year. Discussed that she is doing well from a surgical standpoint in that a reversal is still possible. However, discussion needs to complete her cancer therapy before she undergoes another abdominal surgery. She voiced understanding and will follow-up in clinic in 3 months for an additional abdominal exam worsen or should she have any problems.     Clayburn Pert, MD FACS General Surgeon  07/03/2017,9:37 AM

## 2017-07-03 NOTE — Patient Instructions (Signed)
We will see you back in January. Please see appointment information below.  Increase your water intake and your calorie intake to help while you are on Chemo.   High-Protein and High-Calorie Diet Eating high-protein and high-calorie foods can help you to gain weight, heal after an injury, and recover after an illness or surgery. What is my plan? The specific amount of daily protein and calories you need depends on:  Your body weight.  The reason this diet is recommended for you.  Generally, a high-protein, high-calorie diet involves:  Eating 250-500 extra calories each day.  Making sure that 10-35% of your daily calories come from protein.  Talk to your health care provider about how much protein and how many calories you need each day. Follow the diet as directed by your health care provider. What do I need to know about this diet?  Ask your health care provider if you should take a nutritional supplement.  Try to eat six small meals each day instead of three large meals.  Eat a balanced diet, including one food that is high in protein at each meal.  Keep nutritious snacks handy, such as nuts, trail mixes, dried fruit, and yogurt.  If you have kidney disease or diabetes, eating too much protein may put extra stress on your kidneys. Talk to your health care provider if you have either of those conditions. What are some high-protein foods? Grains Quinoa. Bulgur wheat. Vegetables Soybeans. Peas. Meats and Other Protein Sources Beef, pork, and poultry. Fish and seafood. Eggs. Tofu. Textured vegetable protein (TVP). Peanut butter. Nuts and seeds. Dried beans. Protein powders. Dairy Whole milk. Whole-milk yogurt. Powdered milk. Cheese. Yahoo. Eggnog. Beverages High-protein supplement drinks. Soy milk. Other Protein bars. The items listed above may not be a complete list of recommended foods or beverages. Contact your dietitian for more options. What are some  high-calorie foods? Grains Pasta. Quick breads. Muffins. Pancakes. Ready-to-eat cereal. Vegetables Vegetables cooked in oil or butter. Fried potatoes. Fruits Dried fruit. Fruit leather. Canned fruit in syrup. Fruit juice. Avocados. Meats and Other Protein Sources Peanut butter. Nuts and seeds. Dairy Heavy cream. Whipped cream. Cream cheese. Sour cream. Ice cream. Custard. Pudding. Beverages Meal-replacement beverages. Nutrition shakes. Fruit juice. Sugar-sweetened soft drinks. Condiments Salad dressing. Mayonnaise. Alfredo sauce. Fruit preserves or jelly. Honey. Syrup. Sweets/Desserts Cake. Cookies. Pie. Pastries. Candy bars. Chocolate. Fats and Oils Butter or margarine. Oil. Gravy. Other Meal-replacement bars. The items listed above may not be a complete list of recommended foods or beverages. Contact your dietitian for more options. What are some tips for including high-protein and high-calorie foods in my diet?  Add whole milk, half-and-half, or heavy cream to cereal, pudding, soup, or hot cocoa.  Add whole milk to instant breakfast drinks.  Add peanut butter to oatmeal or smoothies.  Add powdered milk to baked goods, smoothies, or milkshakes.  Add powdered milk, cream, or butter to mashed potatoes.  Add cheese to cooked vegetables.  Make whole-milk yogurt parfaits. Top them with granola, fruit, or nuts.  Add cottage cheese to your fruit.  Add avocados, cheese, or both to sandwiches or salads.  Add meat, poultry, or seafood to rice, pasta, casseroles, salads, and soups.  Use mayonnaise when making egg salad, chicken salad, or tuna salad.  Use peanut butter as a topping for pretzels, celery, or crackers.  Add beans to casseroles, dips, and spreads.  Add pureed beans to sauces and soups.  Replace calorie-free drinks with calorie-containing drinks, such as milk  and fruit juice. This information is not intended to replace advice given to you by your health care  provider. Make sure you discuss any questions you have with your health care provider. Document Released: 09/16/2005 Document Revised: 02/22/2016 Document Reviewed: 03/01/2014 Elsevier Interactive Patient Education  [employer].

## 2017-07-07 ENCOUNTER — Other Ambulatory Visit: Payer: Self-pay | Admitting: Family Medicine

## 2017-07-07 DIAGNOSIS — M51369 Other intervertebral disc degeneration, lumbar region without mention of lumbar back pain or lower extremity pain: Secondary | ICD-10-CM

## 2017-07-07 DIAGNOSIS — M5136 Other intervertebral disc degeneration, lumbar region: Secondary | ICD-10-CM

## 2017-07-07 NOTE — Telephone Encounter (Signed)
Requesting 90 day supply.

## 2017-07-07 NOTE — Telephone Encounter (Signed)
[facility] pharmacy faxed a request for a 90-days supply for the following medication. Thanks CC  furosemide (LASIX) 20 MG tablet  >Take 1 tablet by mouth every day.

## 2017-07-08 ENCOUNTER — Inpatient Hospital Stay (HOSPITAL_BASED_OUTPATIENT_CLINIC_OR_DEPARTMENT_OTHER): Payer: Medicare HMO | Admitting: Oncology

## 2017-07-08 ENCOUNTER — Inpatient Hospital Stay: Payer: Medicare HMO

## 2017-07-08 ENCOUNTER — Inpatient Hospital Stay: Payer: Medicare HMO | Attending: Oncology

## 2017-07-08 ENCOUNTER — Encounter: Payer: Self-pay | Admitting: Oncology

## 2017-07-08 VITALS — BP 126/66 | HR 72 | Temp 98.6°F | Wt 147.5 lb

## 2017-07-08 DIAGNOSIS — Z5111 Encounter for antineoplastic chemotherapy: Secondary | ICD-10-CM

## 2017-07-08 DIAGNOSIS — D500 Iron deficiency anemia secondary to blood loss (chronic): Secondary | ICD-10-CM

## 2017-07-08 DIAGNOSIS — G629 Polyneuropathy, unspecified: Secondary | ICD-10-CM

## 2017-07-08 DIAGNOSIS — Z853 Personal history of malignant neoplasm of breast: Secondary | ICD-10-CM | POA: Insufficient documentation

## 2017-07-08 DIAGNOSIS — M810 Age-related osteoporosis without current pathological fracture: Secondary | ICD-10-CM | POA: Diagnosis not present

## 2017-07-08 DIAGNOSIS — M25569 Pain in unspecified knee: Secondary | ICD-10-CM

## 2017-07-08 DIAGNOSIS — C779 Secondary and unspecified malignant neoplasm of lymph node, unspecified: Secondary | ICD-10-CM | POA: Insufficient documentation

## 2017-07-08 DIAGNOSIS — D3502 Benign neoplasm of left adrenal gland: Secondary | ICD-10-CM

## 2017-07-08 DIAGNOSIS — Z923 Personal history of irradiation: Secondary | ICD-10-CM

## 2017-07-08 DIAGNOSIS — M1990 Unspecified osteoarthritis, unspecified site: Secondary | ICD-10-CM | POA: Diagnosis not present

## 2017-07-08 DIAGNOSIS — M5136 Other intervertebral disc degeneration, lumbar region: Secondary | ICD-10-CM | POA: Diagnosis not present

## 2017-07-08 DIAGNOSIS — G620 Drug-induced polyneuropathy: Secondary | ICD-10-CM

## 2017-07-08 DIAGNOSIS — E876 Hypokalemia: Secondary | ICD-10-CM

## 2017-07-08 DIAGNOSIS — T451X5A Adverse effect of antineoplastic and immunosuppressive drugs, initial encounter: Secondary | ICD-10-CM

## 2017-07-08 DIAGNOSIS — Z87891 Personal history of nicotine dependence: Secondary | ICD-10-CM | POA: Diagnosis not present

## 2017-07-08 DIAGNOSIS — E785 Hyperlipidemia, unspecified: Secondary | ICD-10-CM | POA: Insufficient documentation

## 2017-07-08 DIAGNOSIS — R599 Enlarged lymph nodes, unspecified: Secondary | ICD-10-CM

## 2017-07-08 DIAGNOSIS — C184 Malignant neoplasm of transverse colon: Secondary | ICD-10-CM

## 2017-07-08 DIAGNOSIS — L03818 Cellulitis of other sites: Secondary | ICD-10-CM | POA: Insufficient documentation

## 2017-07-08 DIAGNOSIS — I700 Atherosclerosis of aorta: Secondary | ICD-10-CM | POA: Diagnosis not present

## 2017-07-08 DIAGNOSIS — G8929 Other chronic pain: Secondary | ICD-10-CM | POA: Diagnosis not present

## 2017-07-08 DIAGNOSIS — C187 Malignant neoplasm of sigmoid colon: Secondary | ICD-10-CM

## 2017-07-08 DIAGNOSIS — D3501 Benign neoplasm of right adrenal gland: Secondary | ICD-10-CM | POA: Insufficient documentation

## 2017-07-08 DIAGNOSIS — Z9049 Acquired absence of other specified parts of digestive tract: Secondary | ICD-10-CM | POA: Insufficient documentation

## 2017-07-08 DIAGNOSIS — Z86010 Personal history of colonic polyps: Secondary | ICD-10-CM | POA: Insufficient documentation

## 2017-07-08 DIAGNOSIS — R0602 Shortness of breath: Secondary | ICD-10-CM | POA: Diagnosis not present

## 2017-07-08 DIAGNOSIS — Z79899 Other long term (current) drug therapy: Secondary | ICD-10-CM | POA: Insufficient documentation

## 2017-07-08 DIAGNOSIS — D509 Iron deficiency anemia, unspecified: Secondary | ICD-10-CM | POA: Insufficient documentation

## 2017-07-08 DIAGNOSIS — M549 Dorsalgia, unspecified: Secondary | ICD-10-CM

## 2017-07-08 DIAGNOSIS — K219 Gastro-esophageal reflux disease without esophagitis: Secondary | ICD-10-CM | POA: Diagnosis not present

## 2017-07-08 DIAGNOSIS — E559 Vitamin D deficiency, unspecified: Secondary | ICD-10-CM

## 2017-07-08 DIAGNOSIS — Z800 Family history of malignant neoplasm of digestive organs: Secondary | ICD-10-CM | POA: Insufficient documentation

## 2017-07-08 LAB — COMPREHENSIVE METABOLIC PANEL
ALBUMIN: 2.8 g/dL — AB (ref 3.5–5.0)
ALT: 17 U/L (ref 14–54)
ANION GAP: 8 (ref 5–15)
AST: 26 U/L (ref 15–41)
Alkaline Phosphatase: 77 U/L (ref 38–126)
BILIRUBIN TOTAL: 0.3 mg/dL (ref 0.3–1.2)
BUN: 12 mg/dL (ref 6–20)
CHLORIDE: 107 mmol/L (ref 101–111)
CO2: 21 mmol/L — ABNORMAL LOW (ref 22–32)
Calcium: 8.9 mg/dL (ref 8.9–10.3)
Creatinine, Ser: 0.73 mg/dL (ref 0.44–1.00)
GFR calc Af Amer: >60 mL/min (ref >60)
GFR calc non Af Amer: >60 mL/min (ref >60)
GLUCOSE: 116 mg/dL — AB (ref 65–99)
POTASSIUM: 3.4 mmol/L — AB (ref 3.5–5.1)
SODIUM: 136 mmol/L (ref 135–145)
TOTAL PROTEIN: 6.1 g/dL — AB (ref 6.5–8.1)

## 2017-07-08 LAB — CBC WITH DIFFERENTIAL/PLATELET
BASOS ABS: 0.1 10*3/uL (ref 0–0.1)
BASOS PCT: 1 %
Eosinophils Absolute: 0.2 10*3/uL (ref 0–0.7)
Eosinophils Relative: 3 %
HEMATOCRIT: 36.5 % (ref 35.0–47.0)
Hemoglobin: 12.1 g/dL (ref 12.0–16.0)
LYMPHS PCT: 39 %
Lymphs Abs: 2.6 10*3/uL (ref 1.0–3.6)
MCH: 31.7 pg (ref 26.0–34.0)
MCHC: 33.3 g/dL (ref 32.0–36.0)
MCV: 95.4 fL (ref 80.0–100.0)
Monocytes Absolute: 1.5 10*3/uL — ABNORMAL HIGH (ref 0.2–0.9)
Monocytes Relative: 23 %
NEUTROS ABS: 2.3 10*3/uL (ref 1.4–6.5)
Neutrophils Relative %: 34 %
PLATELETS: 137 10*3/uL — AB (ref 150–440)
RBC: 3.82 MIL/uL (ref 3.80–5.20)
RDW: 18.2 % — ABNORMAL HIGH (ref 11.5–14.5)
WBC: 6.6 10*3/uL (ref 3.6–11.0)

## 2017-07-08 LAB — MAGNESIUM: Magnesium: 1.4 mg/dL — ABNORMAL LOW (ref 1.7–2.4)

## 2017-07-08 MED ORDER — SODIUM CHLORIDE 0.9 % IV SOLN
INTRAVENOUS | Status: DC
  Administered 2017-07-08: 10:00:00 via INTRAVENOUS
  Filled 2017-07-08: qty 1000

## 2017-07-08 MED ORDER — MAGNESIUM SULFATE 2 GM/50ML IV SOLN: 2.0000 g | Freq: Once | INTRAVENOUS | Status: DC

## 2017-07-08 MED ORDER — LEUCOVORIN CALCIUM INJECTION 100 MG
20.0000 mg/m2 | Freq: Once | INTRAMUSCULAR | Status: AC
  Administered 2017-07-08: 34 mg via INTRAVENOUS
  Filled 2017-07-08: qty 1.7

## 2017-07-08 MED ORDER — MAGNESIUM SULFATE 2 GM/50ML IV SOLN
2.0000 g | Freq: Once | INTRAVENOUS | Status: AC
  Administered 2017-07-08: 2 g via INTRAVENOUS
  Filled 2017-07-08: qty 50

## 2017-07-08 MED ORDER — PALONOSETRON HCL INJECTION 0.25 MG/5ML
0.2500 mg | Freq: Once | INTRAVENOUS | Status: AC
  Administered 2017-07-08: 0.25 mg via INTRAVENOUS
  Filled 2017-07-08: qty 5

## 2017-07-08 MED ORDER — POTASSIUM CHLORIDE ER 10 MEQ PO TBCR: 10.0000 meq | EXTENDED_RELEASE_TABLET | Freq: Every day | ORAL | 0 refills | Status: DC

## 2017-07-08 MED ORDER — SODIUM CHLORIDE 0.9 % IV SOLN: 2.0000 g | Freq: Once | INTRAVENOUS | Status: DC

## 2017-07-08 MED ORDER — FLUOROURACIL CHEMO INJECTION 2.5 GM/50ML
400.0000 mg/m2 | Freq: Once | INTRAVENOUS | Status: AC
  Administered 2017-07-08: 700 mg via INTRAVENOUS
  Filled 2017-07-08: qty 14

## 2017-07-08 MED ORDER — HYDROCODONE-ACETAMINOPHEN 7.5-325 MG PO TABS: 1.0000 | ORAL_TABLET | Freq: Four times a day (QID) | ORAL | 0 refills | Status: DC | PRN

## 2017-07-08 MED ORDER — SODIUM CHLORIDE 0.9 % IV SOLN
2400.0000 mg/m2 | INTRAVENOUS | Status: DC
  Administered 2017-07-08: 4200 mg via INTRAVENOUS
  Filled 2017-07-08: qty 84

## 2017-07-08 MED ORDER — FUROSEMIDE 20 MG PO TABS: 20.0000 mg | ORAL_TABLET | Freq: Every day | ORAL | 4 refills | Status: DC

## 2017-07-08 MED ORDER — LEUCOVORIN CALCIUM INJECTION 350 MG: 700.0000 mg | Freq: Once | INTRAVENOUS | Status: DC

## 2017-07-08 NOTE — Progress Notes (Addendum)
[facility] Initial Visit:  Patient Care Team: Birdie Sons, MD as PCP - General (Family Medicine)  PURPOSE OF VISIT: Adjuvant chemotherapy for colon cancer  HISTORY OF PRESENTING ILLNESS: Kimberly [name] 71 y.o. female with PMH listed as below presents for follow up for the management of Stage IIIB Colon Cancer Patient reports a remote history of breast cancer s/p lumpectomy and radiation treatments. Patient had been having abdominal pain, weight loss and bloating.  CT scan showed partial obstruction at the level of transverse colon and ileocolic mesenteric adenopathy. She was prepared for a colonoscopy on 04/15/2017. She started to have worsened abdominal pain, nausea vomiting, unable to maintain oral intake and was sent to ED. She was found to have bowel obstruction and underwent  exploratory laparotomy with transverse colectomy, with end colostomy and mucous fistula formation for obstructing colon lesion. Differential diagnosis prior to surgery was metastatic breast cancer in colon vs colon cancer. The patient did have a colonoscopy 2 years ago without the mass being present at that time but did have 2 small polyps in the transverse colon that were removed.  04/15/2017 Patient underwent transverse colon resection.  Pathology showed T4aN1 moderate differentiated adenocarcinoma, negative margin, 3/16 lymph nodes involved with cancer.  04/21/2017 She was seen in the ER earlier this week due to colostomy wound infection lateral to her right-sided end colostomy and to the midsection of her midline incision. She was started on antibiotics for cellulitis   INTERVAL HISTORY Patient presents for evaluation prior to her cycle 4 FOLFOX.   She tolerate chemotherapy well.  Chemotherapy induced Nausea:  She has mild nausea which is relieved by antiemetics. diarrhea. She reports feeling well. Chronic neuropathy: Intermittent finger tingling and Chemotherapy induced Diarrhea: She reports  2-3 days of increase colostomy output which improved and resolved with imodium PRN.  Neuropathy: She has a history of neuropathy on her toes, which is worse now. She describes her pre-existing neuropathy as numbness and tingling of her toes. After last cycle, she feels burning sensation on dorsal side of bilateral feet, it is persistent, lasting more than 7 days.   Review of Systems - Oncology Constitutional: Negative for fever, night sweats,unintentional weight loss, change in appetite. HENT: Negative for ear pain, hearing loss, nasal bleeding Eyes: Negative for eye pain, double vision   Respiratory: Negative for wheezing, shortness of breath, cough Cardiovascular: Negative for chest pain, palpitation.   Gastrointestinal: Negative abdominal pain, diarrhea, nausea vomiting Endocrine: Negative  Genitourinary: Negative for dysuria, hematuria, frequency Skin: Negative for rash, iching, bruising Neurological: Negative for headache, dizziness, seizure (+) Chronic intermittent finger numbness and tingling, new burning sensation dorsal side of bilateral feet.  Hematological: Negative for easy bruising/bleeding, lymph node enlargement Psychiatric/Behavioral: Negative for depression, anxiety  MEDICAL HISTORY: Past Medical History:  Diagnosis Date  . Breast cancer ([place])   . Breast cancer, left ([facility])    Lumpectomy and rad tx's.  . Chronic back pain   . Chronic knee pain   . Colon cancer ([place])   . Degenerative disc disease, lumbar 04/2013  . Dyspnea   . Family history of adverse reaction to anesthesia    son arrested after anesthesia  . GERD (gastroesophageal reflux disease)   . Hyperlipidemia   . Iron deficiency anemia 05/27/2017  . Neuropathy   . Osteoarthritis    of knee  . Osteoporosis   . Tobacco abuse   . Vitamin D deficiency     SURGICAL HISTORY: Past Surgical History:  Procedure Laterality Date  . APPENDECTOMY  1965  . BREAST EXCISIONAL BIOPSY Left 08/01/2003   lumpectomy rad  11/04-2/28/2005  . CESAREAN SECTION     x3  . COLON SURGERY    . COLONOSCOPY W/ POLYPECTOMY    . ESOPHAGOGASTRODUODENOSCOPY (EGD) WITH PROPOFOL N/A 04/04/2017   Procedure: ESOPHAGOGASTRODUODENOSCOPY (EGD) WITH PROPOFOL;  Surgeon: [name], MD;  Location: [facility];  Service: Endoscopy;  Laterality: N/A;  . HIP FRACTURE SURGERY Left 01/25/2012  . LAPAROTOMY N/A 04/15/2017   Procedure: EXPLORATORY LAPAROTOMY;  Surgeon: Clayburn Pert, MD;  Location: [facility];  Service: General;  Laterality: N/A;  . NECK SURGERY  12/2011  . PORTACATH PLACEMENT Right 05/21/2017   Procedure: INSERTION PORT-A-CATH;  Surgeon: Clayburn Pert, MD;  Location: [facility];  Service: General;  Laterality: Right;  . WRIST FRACTURE SURGERY      SOCIAL HISTORY: Social History   Social History  . Marital status: Divorced    Spouse name: N/A  . Number of children: N/A  . Years of education: N/A   Occupational History  . Not on file.   Social History Main Topics  . Smoking status: Former Smoker    Packs/day: 0.25    Years: 55.00    Types: Cigarettes    Quit date: 05/21/2017  . Smokeless tobacco: Never Used     Comment: started age 45 1/3 to 1 ppd  . Alcohol use 0.0 oz/week     Comment: occasionally drinks beer  . Drug use: No  . Sexual activity: No   Other Topics Concern  . Not on file   Social History Narrative  . No narrative on file    FAMILY HISTORY Family History  Problem Relation Age of Onset  . Diabetes Mother        type 2  . Coronary artery disease Mother   . Deep vein thrombosis Mother   . Colon cancer Father   . Asthma Brother   . Diabetes Brother        type 2    ALLERGIES:  is allergic to iodine; other; and sinus formula [cholestatin].  MEDICATIONS:  Current Outpatient Prescriptions  Medication Sig Dispense Refill  . budesonide-formoterol (SYMBICORT) 80-4.5 MCG/ACT inhaler Inhale 2 puffs into the lungs 2 (two) times daily.    . Calcium Citrate-Vitamin D  (CITRACAL/VITAMIN D PO) Take 1 tablet by mouth daily.     . feeding supplement (BOOST / RESOURCE BREEZE) LIQD Take 1 Container by mouth 3 (three) times daily between meals. (Patient taking differently: Take 1 Container by mouth 2 (two) times daily. ) 30 Container 0  . furosemide (LASIX) 20 MG tablet Take 1 tablet (20 mg total) by mouth daily. 90 tablet 4  . HYDROcodone-acetaminophen (NORCO) 7.5-325 MG tablet Take 1-2 tablets by mouth every 6 (six) hours as needed for moderate pain. 120 tablet 0  . ibuprofen (ADVIL,MOTRIN) 200 MG tablet Take 400 mg by mouth daily as needed for headache or moderate pain.    Marland Kitchen lidocaine-prilocaine (EMLA) cream Apply to affected area once 30 g 3  . LORazepam (ATIVAN) 0.5 MG tablet Take 1 tablet (0.5 mg total) by mouth every 6 (six) hours as needed (Nausea or vomiting). 30 tablet 0  . magnesium chloride (SLOW-MAG) 64 MG TBEC SR tablet Take 1 tablet (64 mg total) by mouth daily. 30 tablet 3  . meloxicam (MOBIC) 15 MG tablet TAKE 1 TABLET EVERY DAY AS NEEDED (Patient taking differently: take 72m by mouth daily) 90 tablet 3  .  omeprazole (PRILOSEC) 20 MG capsule TAKE 1 CAPSULE EVERY DAY 90 capsule 4  . ondansetron (ZOFRAN) 8 MG tablet Take 1 tablet (8 mg total) by mouth 2 (two) times daily as needed for refractory nausea / vomiting. Start on day 3 after chemotherapy. 30 tablet 1  . pravastatin (PRAVACHOL) 40 MG tablet TAKE 1 TABLET (40 MG TOTAL) BY MOUTH DAILY. 90 tablet 4  . Probiotic Product (PROBIOTIC DAILY PO) Take 1 capsule by mouth daily.     . prochlorperazine (COMPAZINE) 10 MG tablet Take 1 tablet (10 mg total) by mouth every 6 (six) hours as needed (Nausea or vomiting). 30 tablet 1  . pyridOXINE (VITAMIN B-6) 50 MG tablet Take 1 tablet (50 mg total) by mouth daily. 30 tablet 3  . simethicone (MYLICON) 80 MG chewable tablet Chew 2 tablets (160 mg total) by mouth every 6 (six) hours as needed for flatulence. 120 tablet 0  . Triamcinolone Acetonide (NASACORT ALLERGY  24HR NA) Place 1 spray into the nose daily as needed (allergies).    . varenicline (CHANTIX CONTINUING MONTH PAK) 1 MG tablet Take 1 tablet (1 mg total) by mouth 2 (two) times daily. 60 tablet 2   No current facility-administered medications for this visit.    Facility-Administered Medications Ordered in Other Visits  Medication Dose Route Frequency Provider Last Rate Last Dose  . heparin lock flush 100 unit/mL  500 Units Intravenous Once Earlie Server, MD      . heparin lock flush 100 unit/mL  500 Units Intravenous Once Earlie Server, MD      . sodium chloride flush (NS) 0.9 % injection 10 mL  10 mL Intracatheter PRN Earlie Server, MD      . sodium chloride flush (NS) 0.9 % injection 10 mL  10 mL Intravenous PRN Earlie Server, MD   10 mL at 05/29/17 1347    PHYSICAL EXAMINATION:  ECOG PERFORMANCE STATUS: 0 - Asymptomatic  Vitals:   07/08/17 0848  BP: 126/66  Pulse: 72  Temp: 98.6 F (37 C)   Filed Weights   07/08/17 0848  Weight: 147 lb 8 oz (66.9 kg)    Physical Exam  GENERAL: No distress, well nourished.  SKIN:  No rashes or significant lesions  HEAD: Normocephalic, No masses, lesions, tenderness or abnormalities  EYES: Conjunctiva are pink, non icteric ENT: External ears normal ,lips , buccal mucosa, and tongue normal and mucous membranes are moist  LYMPH: No palpable cervical and axillary lymphadenopathy  LUNGS: Clear to auscultation, no crackles or wheezes HEART: Regular rate & rhythm, no murmurs, no gallops, S1 normal and S2 normal  ABDOMEN: Abdomen soft, non-tender, normal bowel sounds, I did not appreciate any  masses or organomegaly  MUSCULOSKELETAL: No CVA tenderness and no tenderness on percussion of the back or rib cage.  EXTREMITIES: No edema, no skin discoloration or tenderness NEURO: Alert & oriented, no focal motor/sensory deficits.   LABORATORY DATA: I have personally reviewed the data as listed: CBC    Component Value Date/Time   WBC 7.4 06/24/2017 0820   RBC 3.83  06/24/2017 0820   HGB 12.2 06/24/2017 0820   HGB 12.2 01/02/2017 1410   HCT 35.5 06/24/2017 0820   HCT 37.3 01/02/2017 1410   PLT 171 06/24/2017 0820   PLT 486 (H) 01/02/2017 1410   MCV 92.7 06/24/2017 0820   MCV 86 01/02/2017 1410   MCV 96 01/29/2012 0428   MCH 31.9 06/24/2017 0820   MCHC 34.4 06/24/2017 0820   RDW 18.7 (H)  06/24/2017 0820   RDW 16.1 (H) 01/02/2017 1410   RDW 12.7 01/29/2012 0428   LYMPHSABS 2.3 06/24/2017 0820   LYMPHSABS 3.9 (H) 01/02/2017 1410   LYMPHSABS 1.9 01/29/2012 0428   MONOABS 1.7 (H) 06/24/2017 0820   MONOABS 2.0 (H) 01/29/2012 0428   EOSABS 0.2 06/24/2017 0820   EOSABS 0.4 01/02/2017 1410   EOSABS 0.1 01/29/2012 0428   BASOSABS 0.2 (H) 06/24/2017 0820   BASOSABS 0.1 01/02/2017 1410   BASOSABS 0.1 01/29/2012 0428   CMP Latest Ref Rng & Units 06/24/2017 06/10/2017 05/29/2017  Glucose 65 - 99 mg/dL 112(H) 129(H) -  BUN 6 - 20 mg/dL 8 9 -  Creatinine 0.44 - 1.00 mg/dL 0.75 0.73 -  Sodium 135 - 145 mmol/L 138 135 -  Potassium 3.5 - 5.1 mmol/L 3.4(L) 3.7 5.1  Chloride 101 - 111 mmol/L 106 105 -  CO2 22 - 32 mmol/L 23 24 -  Calcium 8.9 - 10.3 mg/dL 8.4(L) 8.5(L) -  Total Protein 6.5 - 8.1 g/dL 6.6 6.4(L) -  Total Bilirubin 0.3 - 1.2 mg/dL 0.3 0.4 -  Alkaline Phos 38 - 126 U/L 76 58 -  AST 15 - 41 U/L 22 20 -  ALT 14 - 54 U/L 15 12(L) -    RADIOGRAPHIC STUDIES: I have personally reviewed the radiological images as listed and agree with the findings in the report 04/08/2017 CT abdomen pelvis w contrast IMPRESSION: 1. Partial obstruction at the level of the transverse colon, likely secondary to carcinoma. 2. Ileocolic mesenteric adenopathy, suspicious for nodal metastasis. 3.  Aortic Atherosclerosis (ICD10-I70.0). 4. Bilateral adrenal adenomas. 04/21/2017 CT abdomen pelvis with contrast. IMPRESSION: Postsurgical changes consistent with the given clinical history. A small amount of fluid in a year is noted surrounding the right lower quadrant  ostomy likely felt to be postoperative in nature. No definitive abscess is seen. The patient's palpable abnormality likely corresponds to the small fluid collection.  PATHOLOGY DIAGNOSIS:  A. COLON MASS, TRANSVERSE; TRANSVERSE COLECTOMY:  - INVASIVE ADENOCARCINOMA, MODERATELY DIFFERENTIATED.  - THREE OF SIXTEEN LYMPH NODES INVOLVED BY METASTASIS (3/16).  - LYMPHOVASCULAR AND PERINEURAL INVASION PRESENT.  - SEE SUMMARY BELOW.  Surgical Pathology Cancer Case Summary  COLON AND RECTUM:  Procedure: transverse colectomy  Tumor Site:  transverse colon  Tumor Size: Greatest dimension: 2.3 cm  Macroscopic Tumor Perforation: not specified  Histologic Type: adenocarcinoma  Histologic Grade: G2, moderately differentiated  Tumor Extension: tumor invades the visceral peritoneum  Margins: all margins are uninvolved by invasive carcinoma, high-grade dysplasia, intramucosal adenocarcinoma, and adenoma  Treatment Effect: no known presurgical therapy  Lymphovascular Invasion: present  Perineural Invasion: present  Tumor Deposits: not identified  Regional Lymph Nodes: # examined: 16  # involved: 3  Pathologic Stage Classification (pTNM, AJCC 8th Edition): pT4a pN1b   ADDENDUM:   MICROSATELLITE INSTABILITY IMMUNOHISTOCHEMISTRY  MISMATCH REPAIR PROTEINS:   MLH1: Intact nuclear expression  MSH2: Intact nuclear expression  MSH6: Intact nuclear expression  PMS2: Intact nuclear expression   Interpretation: No loss of nuclear expression of mismatch repair  proteins: Low probability of MSI-H.    ASSESSMENT/PLAN 71 yo female who has MSI stable stage IIIB Colon Cancer starting adjuvant FOLFOX. # baseline CEA is 2.1  Cancer Staging Malignant neoplasm of transverse colon [facility]) Staging form: Colon and Rectum, AJCC 8th Edition - Clinical stage from 04/15/2017: Stage IIIB (cT4a, cN1b, cM0) - Signed by Earlie Server, MD on 04/26/2017  1. Malignant neoplasm of transverse colon ([place])   2. Encounter for  antineoplastic  chemotherapy   3. Chemotherapy-induced peripheral neuropathy ([facility])   4. Personal history of malignant neoplasm of breast   5. Iron deficiency anemia due to chronic blood loss    #s/p cycle 3 FOLFOX (Reduce oxlaiplatin dose to 42m/m2 due to neuropathy), Goal of care is curative intent.  Due to the worsened peripheral sensory neuropathy, will discontinue Oxlaiplatin starting from Cycle 4  # As she has T4 disease, after she finishes systemic chemotherapy, I think she would benefit from radiation to reduce local recurrence. Will refer to RadOnc after finishing adjuvant chemotherapy.   # Chronic hypomagnesia: Magnesium Sulfate IV  2 g today. Continue oral SLOW Mag 1 tablet daily.  # Hypokalemia: start oral potassium chloride 169m daily. She is on Lasix for chronic LE swelling. I asked her to check with Dr.FIsher if she still needs to be on. Clinically she does not have any lower extremity edema per my examination.   # Anemia due to iron deficiency from previous chronic blood loss from tumor and blood loss from surgery. She has received  4 dose of IV venofer, Hemoglobin normalized.  # genetic testing pending. # Grade 2 neuropathy: She tried Neurontin in the past for preexisting neuropathy and has to stop due to memory loss.  She is on B6 5062maily. Oxaliplatin is discontinued.   All questions were answered. The patient knows to call the clinic with any problems, questions or concerns.  Follow up in 2 weeks on day 1 of cycle 5, patient is aware that my colleague Dr.Rao will be covering me.  I will evaluate patient on day 1 of cycle 6  Dr. ZhoEarlie ServerD, PhD [facility]- [id number]/05/2017   Addendum: Genetic test [facility] came back negative. No pathogenetic sequence variants or deletions/duplications identified. Results will be scanned to [facility]  Dr.Charmine Bockrath YuTasia Catchings

## 2017-07-08 NOTE — Progress Notes (Signed)
Patient here today for follow up.  Patient c/o "burning in feet"

## 2017-07-08 NOTE — Progress Notes (Signed)
No oxaliplatin, just 5-FU today per MD.

## 2017-07-09 ENCOUNTER — Other Ambulatory Visit: Payer: Self-pay | Admitting: *Deleted

## 2017-07-09 NOTE — Telephone Encounter (Signed)
Sent already.

## 2017-07-10 ENCOUNTER — Inpatient Hospital Stay: Payer: Medicare HMO

## 2017-07-10 VITALS — BP 137/75 | HR 78 | Temp 97.7°F

## 2017-07-10 DIAGNOSIS — E876 Hypokalemia: Secondary | ICD-10-CM | POA: Diagnosis not present

## 2017-07-10 DIAGNOSIS — Z5111 Encounter for antineoplastic chemotherapy: Secondary | ICD-10-CM | POA: Diagnosis not present

## 2017-07-10 DIAGNOSIS — C184 Malignant neoplasm of transverse colon: Secondary | ICD-10-CM

## 2017-07-10 DIAGNOSIS — G629 Polyneuropathy, unspecified: Secondary | ICD-10-CM | POA: Diagnosis not present

## 2017-07-10 DIAGNOSIS — L03818 Cellulitis of other sites: Secondary | ICD-10-CM | POA: Diagnosis not present

## 2017-07-10 DIAGNOSIS — D500 Iron deficiency anemia secondary to blood loss (chronic): Secondary | ICD-10-CM | POA: Diagnosis not present

## 2017-07-10 DIAGNOSIS — D3501 Benign neoplasm of right adrenal gland: Secondary | ICD-10-CM | POA: Diagnosis not present

## 2017-07-10 DIAGNOSIS — C779 Secondary and unspecified malignant neoplasm of lymph node, unspecified: Secondary | ICD-10-CM | POA: Diagnosis not present

## 2017-07-10 MED ORDER — SODIUM CHLORIDE 0.9% FLUSH
10.0000 mL | INTRAVENOUS | Status: DC | PRN
  Administered 2017-07-10: 10 mL
  Filled 2017-07-10: qty 10

## 2017-07-10 MED ORDER — HEPARIN SOD (PORK) LOCK FLUSH 100 UNIT/ML IV SOLN
500.0000 [IU] | Freq: Once | INTRAVENOUS | Status: AC | PRN
  Administered 2017-07-10: 500 [IU]
  Filled 2017-07-10: qty 5

## 2017-07-17 ENCOUNTER — Telehealth: Payer: Self-pay | Admitting: General Practice

## 2017-07-17 NOTE — Telephone Encounter (Signed)
Called patient back and told her that I had spoken to Dr. Adonis Huguenin and he stated that it was normal for her to be this way. However, I told her that if she was having an irritation with the stoma itself, redness around it or developed a fever, then to please call us back so we could see her. Patient agreed and had no further questions.

## 2017-07-17 NOTE — Telephone Encounter (Signed)
Patients calling said her Colostomy bag swells out, and stings and burns. Patient is asking is this something to worry about or does she need to come in. Please call patient and advice.

## 2017-07-21 NOTE — Progress Notes (Signed)
Hematology/Oncology Consult note [facility]  [phone] [phone]  Patient Care Team: Birdie Sons, MD as PCP - General (Family Medicine)   Name of the patient: Kimberly [name]  [id number]  May 11, 1946   Date of visit: 07/21/17  Diagnosis- Stage III B colon cancer  Chief complaint/ Reason for visit- on treatment assessment prior to cycle 5 of adjuvant 5FU chemotherapy  Heme/Onc history: Kimberly [name] 71 y.o. female with PMH listed as below presents for follow up for the management of Stage IIIB Colon Cancer Patient reports a remote history of breast cancer s/p lumpectomy and radiation treatments. Patient had been having abdominal pain, weight loss and bloating.  CT scan showed partial obstruction at the level of transverse colon and ileocolic mesenteric adenopathy. She was prepared for a colonoscopy on 04/15/2017. She started to have worsened abdominal pain, nausea vomiting, unable to maintain oral intake and was sent to ED. She was found to have bowel obstruction and underwent  exploratory laparotomy with transverse colectomy, with end colostomy and mucous fistula formation for obstructing colon lesion. Differential diagnosis prior to surgery was metastatic breast cancer in colon vs colon cancer. The patient did have a colonoscopy 2 years ago without the mass being present at that time but did have 2 small polyps in the transverse colon that were removed.  04/15/2017 Patient underwent transverse colon resection.  Pathology showed T4aN1 moderate differentiated adenocarcinoma, negative margin, 3/16 lymph nodes involved with cancer.  04/21/2017 She was seen in the ER earlier this week due to colostomy wound infection lateral to her right-sided end colostomy and to the midsection of her midline incision. She was started on antibiotics for cellulitis   She has completed 4 cycles so far and oxaliplatin discontinued after 3 cycles due to  neuropathy  Interval history- neuropathy/burning sensation is stable and restricted to her feet but it does bother her especially at night when it is worse and she needs to keep her feet off her besheets as they are sensitive. Denies any pain or nausea  ECOG PS- 1 Pain scale- 0   Review of systems- Review of Systems  Constitutional: Negative for chills, fever, malaise/fatigue and weight loss.  HENT: Negative for congestion, ear discharge and nosebleeds.   Eyes: Negative for blurred vision.  Respiratory: Negative for cough, hemoptysis, sputum production, shortness of breath and wheezing.   Cardiovascular: Negative for chest pain, palpitations, orthopnea and claudication.  Gastrointestinal: Negative for abdominal pain, blood in stool, constipation, diarrhea, heartburn, melena, nausea and vomiting.  Genitourinary: Negative for dysuria, flank pain, frequency, hematuria and urgency.  Musculoskeletal: Negative for back pain, joint pain and myalgias.  Skin: Negative for rash.  Neurological: Negative for dizziness, tingling, focal weakness, seizures, weakness and headaches.       Burning sensation in b/l feet  Endo/Heme/Allergies: Does not bruise/bleed easily.  Psychiatric/Behavioral: Negative for depression and suicidal ideas. The patient does not have insomnia.       Allergies  Allergen Reactions  . Iodine Other (See Comments)    Topical iodine she states "if you put it in an open sore it will cause a eating ulcer."  . Other Nausea And Vomiting    Antihystamines  . Sinus Formula [Cholestatin] Nausea Only     Past Medical History:  Diagnosis Date  . Breast cancer ([place])   . Breast cancer, left ([facility])    Lumpectomy and rad tx's.  . Chronic back pain   . Chronic knee pain   .  Colon cancer ([place])   . Degenerative disc disease, lumbar 04/2013  . Dyspnea   . Family history of adverse reaction to anesthesia    son arrested after anesthesia  . GERD (gastroesophageal reflux disease)   .  Hyperlipidemia   . Iron deficiency anemia 05/27/2017  . Neuropathy   . Osteoarthritis    of knee  . Osteoporosis   . Tobacco abuse   . Vitamin D deficiency      Past Surgical History:  Procedure Laterality Date  . APPENDECTOMY  1965  . BREAST EXCISIONAL BIOPSY Left 08/01/2003   lumpectomy rad 11/04-2/28/2005  . CESAREAN SECTION     x3  . COLON SURGERY    . COLONOSCOPY W/ POLYPECTOMY    . ESOPHAGOGASTRODUODENOSCOPY (EGD) WITH PROPOFOL N/A 04/04/2017   Procedure: ESOPHAGOGASTRODUODENOSCOPY (EGD) WITH PROPOFOL;  Surgeon: [name], MD;  Location: [place];  Service: Endoscopy;  Laterality: N/A;  . HIP FRACTURE SURGERY Left 01/25/2012  . LAPAROTOMY N/A 04/15/2017   Procedure: EXPLORATORY LAPAROTOMY;  Surgeon: Clayburn Pert, MD;  Location: [facility];  Service: General;  Laterality: N/A;  . NECK SURGERY  12/2011  . PORTACATH PLACEMENT Right 05/21/2017   Procedure: INSERTION PORT-A-CATH;  Surgeon: Clayburn Pert, MD;  Location: [facility];  Service: General;  Laterality: Right;  . WRIST FRACTURE SURGERY      Social History   Social History  . Marital status: Divorced    Spouse name: N/A  . Number of children: N/A  . Years of education: N/A   Occupational History  . Not on file.   Social History Main Topics  . Smoking status: Former Smoker    Packs/day: 0.25    Years: 55.00    Types: Cigarettes    Quit date: 05/21/2017  . Smokeless tobacco: Never Used     Comment: started age 51 1/3 to 1 ppd  . Alcohol use 0.0 oz/week     Comment: occasionally drinks beer  . Drug use: No  . Sexual activity: No   Other Topics Concern  . Not on file   Social History Narrative  . No narrative on file    Family History  Problem Relation Age of Onset  . Diabetes Mother        type 2  . Coronary artery disease Mother   . Deep vein thrombosis Mother   . Colon cancer Father   . Asthma Brother   . Diabetes Brother        type 2     Current Outpatient Prescriptions:    .  budesonide-formoterol (SYMBICORT) 80-4.5 MCG/ACT inhaler, Inhale 2 puffs into the lungs 2 (two) times daily., Disp: , Rfl:  .  Calcium Citrate-Vitamin D (CITRACAL/VITAMIN D PO), Take 1 tablet by mouth daily. , Disp: , Rfl:  .  feeding supplement (BOOST / RESOURCE BREEZE) LIQD, Take 1 Container by mouth 3 (three) times daily between meals. (Patient taking differently: Take 1 Container by mouth 2 (two) times daily. ), Disp: 30 Container, Rfl: 0 .  furosemide (LASIX) 20 MG tablet, Take 1 tablet (20 mg total) by mouth daily., Disp: 90 tablet, Rfl: 4 .  HYDROcodone-acetaminophen (NORCO) 7.5-325 MG tablet, Take 1-2 tablets by mouth every 6 (six) hours as needed for moderate pain., Disp: 120 tablet, Rfl: 0 .  ibuprofen (ADVIL,MOTRIN) 200 MG tablet, Take 400 mg by mouth daily as needed for headache or moderate pain., Disp: , Rfl:  .  lidocaine-prilocaine (EMLA) cream, Apply to affected area once, Disp: 30 g, Rfl:  3 .  LORazepam (ATIVAN) 0.5 MG tablet, Take 1 tablet (0.5 mg total) by mouth every 6 (six) hours as needed (Nausea or vomiting)., Disp: 30 tablet, Rfl: 0 .  magnesium chloride (SLOW-MAG) 64 MG TBEC SR tablet, Take 1 tablet (64 mg total) by mouth daily., Disp: 30 tablet, Rfl: 3 .  meloxicam (MOBIC) 15 MG tablet, TAKE 1 TABLET EVERY DAY AS NEEDED (Patient taking differently: take 15mg  by mouth daily), Disp: 90 tablet, Rfl: 3 .  omeprazole (PRILOSEC) 20 MG capsule, TAKE 1 CAPSULE EVERY DAY, Disp: 90 capsule, Rfl: 4 .  ondansetron (ZOFRAN) 8 MG tablet, Take 1 tablet (8 mg total) by mouth 2 (two) times daily as needed for refractory nausea / vomiting. Start on day 3 after chemotherapy., Disp: 30 tablet, Rfl: 1 .  potassium chloride (K-DUR) 10 MEQ tablet, Take 1 tablet (10 mEq total) by mouth daily., Disp: 30 tablet, Rfl: 0 .  pravastatin (PRAVACHOL) 40 MG tablet, TAKE 1 TABLET (40 MG TOTAL) BY MOUTH DAILY., Disp: 90 tablet, Rfl: 4 .  Probiotic Product (PROBIOTIC DAILY PO), Take 1 capsule by mouth  daily. , Disp: , Rfl:  .  prochlorperazine (COMPAZINE) 10 MG tablet, Take 1 tablet (10 mg total) by mouth every 6 (six) hours as needed (Nausea or vomiting)., Disp: 30 tablet, Rfl: 1 .  pyridOXINE (VITAMIN B-6) 50 MG tablet, Take 1 tablet (50 mg total) by mouth daily., Disp: 30 tablet, Rfl: 3 .  simethicone (MYLICON) 80 MG chewable tablet, Chew 2 tablets (160 mg total) by mouth every 6 (six) hours as needed for flatulence., Disp: 120 tablet, Rfl: 0 .  Triamcinolone Acetonide (NASACORT ALLERGY 24HR NA), Place 1 spray into the nose daily as needed (allergies)., Disp: , Rfl:  .  varenicline (CHANTIX CONTINUING MONTH PAK) 1 MG tablet, Take 1 tablet (1 mg total) by mouth 2 (two) times daily., Disp: 60 tablet, Rfl: 2 No current facility-administered medications for this visit.   Facility-Administered Medications Ordered in Other Visits:  .  heparin lock flush 100 unit/mL, 500 Units, Intravenous, Once, Earlie Server, MD .  heparin lock flush 100 unit/mL, 500 Units, Intravenous, Once, Earlie Server, MD .  sodium chloride flush (NS) 0.9 % injection 10 mL, 10 mL, Intracatheter, PRN, Earlie Server, MD .  sodium chloride flush (NS) 0.9 % injection 10 mL, 10 mL, Intravenous, PRN, Earlie Server, MD, 10 mL at 05/29/17 1347  Physical exam:  Vitals:   07/22/17 0927  BP: 134/83  Pulse: 80  Resp: 16  Temp: 98.7 F (37.1 C)  TempSrc: Tympanic  Weight: 149 lb (67.6 kg)   Physical Exam  Constitutional: She is oriented to person, place, and time and well-developed, well-nourished, and in no distress.  HENT:  Head: Normocephalic and atraumatic.  Eyes: Pupils are equal, round, and reactive to light. EOM are normal.  Neck: Normal range of motion.  Cardiovascular: Normal rate, regular rhythm and normal heart sounds.   Pulmonary/Chest: Effort normal and breath sounds normal.  Abdominal: Soft. Bowel sounds are normal.  colostomy in place. Midline well healed surgical scar  Neurological: She is alert and oriented to person, place,  and time.  Skin: Skin is warm and dry.     CMP Latest Ref Rng & Units 07/22/2017  Glucose 65 - 99 mg/dL 112(H)  BUN 6 - 20 mg/dL 8  Creatinine 0.44 - 1.00 mg/dL 0.60  Sodium 135 - 145 mmol/L 136  Potassium 3.5 - 5.1 mmol/L 3.8  Chloride 101 - 111 mmol/L 105  CO2 22 - 32 mmol/L 23  Calcium 8.9 - 10.3 mg/dL 8.2(L)  Total Protein 6.5 - 8.1 g/dL 6.2(L)  Total Bilirubin 0.3 - 1.2 mg/dL 0.4  Alkaline Phos 38 - 126 U/L 75  AST 15 - 41 U/L 21  ALT 14 - 54 U/L 12(L)   CBC Latest Ref Rng & Units 07/22/2017  WBC 3.6 - 11.0 K/uL 9.4  Hemoglobin 12.0 - 16.0 g/dL 12.9  Hematocrit 35.0 - 47.0 % 38.0  Platelets 150 - 440 K/uL 219      Assessment and plan- Patient is a 71 y.o. female with Stage IIIB colon cancer [id number] here for on treatment assessment prior to cycle 5 of 5FU chemotherapy  Counts ok to proceed with cycle 5 of 5FU chemotherapy today. oxaliplatin has been discontinued after cycle 3 due to peripheral neuropathy. She will RTC in 2 weeks with cbc, cmp and magnesium to see Dr. Tasia Catchings for cycle 6 of 5FU chemotherapy  Chemo induced peripheral neuropathy- given her persistent symptoms that are worse at night, I will prescribe cymbalta 30 mg QHS. She will increase to 60 mg once a day if no side efefcts after 1 week  Iron deficiency anemia- s/p 4 doses of venofer. Improved and anemia has resolved  Hypomagnesemia- she will get 2 gm of IV magnesium today as mg <1.7  Hypokalemia- normalized   Visit Diagnosis 1. Malignant neoplasm of transverse colon ([place])   2. Encounter for antineoplastic chemotherapy   3. Chemotherapy-induced neuropathy ([place])   4. Iron deficiency anemia due to chronic blood loss      Dr. Randa Evens, MD, MPH [facility] Pager- [phone] 07/22/2017 9:56 AM

## 2017-07-22 ENCOUNTER — Encounter: Payer: Self-pay | Admitting: Oncology

## 2017-07-22 ENCOUNTER — Inpatient Hospital Stay: Payer: Medicare HMO

## 2017-07-22 ENCOUNTER — Inpatient Hospital Stay (HOSPITAL_BASED_OUTPATIENT_CLINIC_OR_DEPARTMENT_OTHER): Payer: Medicare HMO | Admitting: Oncology

## 2017-07-22 VITALS — BP 134/83 | HR 80 | Temp 98.7°F | Resp 16 | Wt 149.0 lb

## 2017-07-22 DIAGNOSIS — D500 Iron deficiency anemia secondary to blood loss (chronic): Secondary | ICD-10-CM | POA: Diagnosis not present

## 2017-07-22 DIAGNOSIS — M5136 Other intervertebral disc degeneration, lumbar region: Secondary | ICD-10-CM

## 2017-07-22 DIAGNOSIS — C779 Secondary and unspecified malignant neoplasm of lymph node, unspecified: Secondary | ICD-10-CM | POA: Diagnosis not present

## 2017-07-22 DIAGNOSIS — R0602 Shortness of breath: Secondary | ICD-10-CM

## 2017-07-22 DIAGNOSIS — G629 Polyneuropathy, unspecified: Secondary | ICD-10-CM | POA: Diagnosis not present

## 2017-07-22 DIAGNOSIS — D3502 Benign neoplasm of left adrenal gland: Secondary | ICD-10-CM

## 2017-07-22 DIAGNOSIS — K219 Gastro-esophageal reflux disease without esophagitis: Secondary | ICD-10-CM

## 2017-07-22 DIAGNOSIS — C184 Malignant neoplasm of transverse colon: Secondary | ICD-10-CM

## 2017-07-22 DIAGNOSIS — G8929 Other chronic pain: Secondary | ICD-10-CM | POA: Diagnosis not present

## 2017-07-22 DIAGNOSIS — Z87891 Personal history of nicotine dependence: Secondary | ICD-10-CM

## 2017-07-22 DIAGNOSIS — Z5111 Encounter for antineoplastic chemotherapy: Secondary | ICD-10-CM

## 2017-07-22 DIAGNOSIS — M25569 Pain in unspecified knee: Secondary | ICD-10-CM

## 2017-07-22 DIAGNOSIS — Z9049 Acquired absence of other specified parts of digestive tract: Secondary | ICD-10-CM

## 2017-07-22 DIAGNOSIS — E785 Hyperlipidemia, unspecified: Secondary | ICD-10-CM

## 2017-07-22 DIAGNOSIS — M549 Dorsalgia, unspecified: Secondary | ICD-10-CM | POA: Diagnosis not present

## 2017-07-22 DIAGNOSIS — Z853 Personal history of malignant neoplasm of breast: Secondary | ICD-10-CM

## 2017-07-22 DIAGNOSIS — M1990 Unspecified osteoarthritis, unspecified site: Secondary | ICD-10-CM

## 2017-07-22 DIAGNOSIS — D509 Iron deficiency anemia, unspecified: Secondary | ICD-10-CM

## 2017-07-22 DIAGNOSIS — G620 Drug-induced polyneuropathy: Secondary | ICD-10-CM

## 2017-07-22 DIAGNOSIS — Z923 Personal history of irradiation: Secondary | ICD-10-CM

## 2017-07-22 DIAGNOSIS — T451X5A Adverse effect of antineoplastic and immunosuppressive drugs, initial encounter: Secondary | ICD-10-CM

## 2017-07-22 DIAGNOSIS — E876 Hypokalemia: Secondary | ICD-10-CM | POA: Diagnosis not present

## 2017-07-22 DIAGNOSIS — Z800 Family history of malignant neoplasm of digestive organs: Secondary | ICD-10-CM

## 2017-07-22 DIAGNOSIS — I700 Atherosclerosis of aorta: Secondary | ICD-10-CM

## 2017-07-22 DIAGNOSIS — L03818 Cellulitis of other sites: Secondary | ICD-10-CM | POA: Diagnosis not present

## 2017-07-22 DIAGNOSIS — Z79899 Other long term (current) drug therapy: Secondary | ICD-10-CM

## 2017-07-22 DIAGNOSIS — M810 Age-related osteoporosis without current pathological fracture: Secondary | ICD-10-CM

## 2017-07-22 DIAGNOSIS — D3501 Benign neoplasm of right adrenal gland: Secondary | ICD-10-CM

## 2017-07-22 DIAGNOSIS — Z86010 Personal history of colonic polyps: Secondary | ICD-10-CM

## 2017-07-22 DIAGNOSIS — E559 Vitamin D deficiency, unspecified: Secondary | ICD-10-CM

## 2017-07-22 DIAGNOSIS — R599 Enlarged lymph nodes, unspecified: Secondary | ICD-10-CM

## 2017-07-22 LAB — CBC WITH DIFFERENTIAL/PLATELET
Basophils Absolute: 0.1 10*3/uL (ref 0–0.1)
Basophils Relative: 1 %
Eosinophils Absolute: 0.2 10*3/uL (ref 0–0.7)
Eosinophils Relative: 2 %
HCT: 38 % (ref 35.0–47.0)
HEMOGLOBIN: 12.9 g/dL (ref 12.0–16.0)
LYMPHS PCT: 26 %
Lymphs Abs: 2.4 10*3/uL (ref 1.0–3.6)
MCH: 32.8 pg (ref 26.0–34.0)
MCHC: 33.9 g/dL (ref 32.0–36.0)
MCV: 96.8 fL (ref 80.0–100.0)
MONO ABS: 1.8 10*3/uL — AB (ref 0.2–0.9)
Monocytes Relative: 19 %
NEUTROS ABS: 5 10*3/uL (ref 1.4–6.5)
NEUTROS PCT: 52 %
PLATELETS: 219 10*3/uL (ref 150–440)
RBC: 3.93 MIL/uL (ref 3.80–5.20)
RDW: 19 % — ABNORMAL HIGH (ref 11.5–14.5)
WBC: 9.4 10*3/uL (ref 3.6–11.0)

## 2017-07-22 LAB — COMPREHENSIVE METABOLIC PANEL
ALBUMIN: 2.9 g/dL — AB (ref 3.5–5.0)
ALK PHOS: 75 U/L (ref 38–126)
ALT: 12 U/L — AB (ref 14–54)
ANION GAP: 8 (ref 5–15)
AST: 21 U/L (ref 15–41)
BUN: 8 mg/dL (ref 6–20)
CALCIUM: 8.2 mg/dL — AB (ref 8.9–10.3)
CHLORIDE: 105 mmol/L (ref 101–111)
CO2: 23 mmol/L (ref 22–32)
Creatinine, Ser: 0.6 mg/dL (ref 0.44–1.00)
GFR calc Af Amer: >60 mL/min (ref >60)
GFR calc non Af Amer: >60 mL/min (ref >60)
GLUCOSE: 112 mg/dL — AB (ref 65–99)
Potassium: 3.8 mmol/L (ref 3.5–5.1)
SODIUM: 136 mmol/L (ref 135–145)
Total Bilirubin: 0.4 mg/dL (ref 0.3–1.2)
Total Protein: 6.2 g/dL — ABNORMAL LOW (ref 6.5–8.1)

## 2017-07-22 LAB — MAGNESIUM: Magnesium: 1.6 mg/dL — ABNORMAL LOW (ref 1.7–2.4)

## 2017-07-22 MED ORDER — MAGNESIUM SULFATE 2 GM/50ML IV SOLN
2.0000 g | Freq: Once | INTRAVENOUS | Status: AC
  Administered 2017-07-22: 2 g via INTRAVENOUS
  Filled 2017-07-22: qty 50

## 2017-07-22 MED ORDER — DULOXETINE HCL 30 MG PO CPEP: 30.0000 mg | ORAL_CAPSULE | ORAL | 0 refills | Status: DC

## 2017-07-22 MED ORDER — SODIUM CHLORIDE 0.9% FLUSH
10.0000 mL | Freq: Once | INTRAVENOUS | Status: AC
  Administered 2017-07-22: 10 mL via INTRAVENOUS
  Filled 2017-07-22: qty 10

## 2017-07-22 MED ORDER — MAGNESIUM SULFATE 50 % IJ SOLN: 2.0000 g | INTRAMUSCULAR | Status: DC | PRN

## 2017-07-22 MED ORDER — SODIUM CHLORIDE 0.9 % IV SOLN
INTRAVENOUS | Status: DC
  Administered 2017-07-22: 10:00:00 via INTRAVENOUS
  Filled 2017-07-22: qty 1000

## 2017-07-22 MED ORDER — PALONOSETRON HCL INJECTION 0.25 MG/5ML
0.2500 mg | Freq: Once | INTRAVENOUS | Status: AC
  Administered 2017-07-22: 0.25 mg via INTRAVENOUS
  Filled 2017-07-22: qty 5

## 2017-07-22 MED ORDER — SODIUM CHLORIDE 0.9 % IV SOLN
2400.0000 mg/m2 | INTRAVENOUS | Status: AC
  Administered 2017-07-22: 4200 mg via INTRAVENOUS
  Filled 2017-07-22: qty 84

## 2017-07-22 MED ORDER — HEPARIN SOD (PORK) LOCK FLUSH 100 UNIT/ML IV SOLN
500.0000 [IU] | Freq: Once | INTRAVENOUS | Status: DC
  Filled 2017-07-22: qty 5

## 2017-07-22 MED ORDER — LEUCOVORIN CALCIUM INJECTION 100 MG
20.0000 mg/m2 | Freq: Once | INTRAMUSCULAR | Status: AC
  Administered 2017-07-22: 34 mg via INTRAVENOUS
  Filled 2017-07-22: qty 1.7

## 2017-07-22 MED ORDER — FLUOROURACIL CHEMO INJECTION 2.5 GM/50ML
400.0000 mg/m2 | Freq: Once | INTRAVENOUS | Status: AC
  Administered 2017-07-22: 700 mg via INTRAVENOUS
  Filled 2017-07-22: qty 14

## 2017-07-22 MED ORDER — LEUCOVORIN CALCIUM INJECTION 350 MG: 400.0000 mg/m2 | Freq: Once | INTRAVENOUS | Status: DC

## 2017-07-22 NOTE — Addendum Note (Signed)
Addended by: Luella Cook on: 07/22/2017 10:04 AM   Modules accepted: Orders

## 2017-07-22 NOTE — Progress Notes (Signed)
Patient here for follow up with labs and treatment with 81fu. Dr. Tasia Catchings did not give her the oxaliplatin at her last treatment due to neuropathy. Patient states that the neuropathy is better, but not completely gone.

## 2017-07-24 ENCOUNTER — Inpatient Hospital Stay: Payer: Medicare HMO

## 2017-07-24 VITALS — BP 134/80 | HR 83 | Temp 97.8°F | Resp 20

## 2017-07-24 DIAGNOSIS — L03818 Cellulitis of other sites: Secondary | ICD-10-CM | POA: Diagnosis not present

## 2017-07-24 DIAGNOSIS — G629 Polyneuropathy, unspecified: Secondary | ICD-10-CM | POA: Diagnosis not present

## 2017-07-24 DIAGNOSIS — C779 Secondary and unspecified malignant neoplasm of lymph node, unspecified: Secondary | ICD-10-CM | POA: Diagnosis not present

## 2017-07-24 DIAGNOSIS — C184 Malignant neoplasm of transverse colon: Secondary | ICD-10-CM | POA: Diagnosis not present

## 2017-07-24 DIAGNOSIS — Z5111 Encounter for antineoplastic chemotherapy: Secondary | ICD-10-CM | POA: Diagnosis not present

## 2017-07-24 DIAGNOSIS — D500 Iron deficiency anemia secondary to blood loss (chronic): Secondary | ICD-10-CM | POA: Diagnosis not present

## 2017-07-24 DIAGNOSIS — E876 Hypokalemia: Secondary | ICD-10-CM | POA: Diagnosis not present

## 2017-07-24 DIAGNOSIS — D3501 Benign neoplasm of right adrenal gland: Secondary | ICD-10-CM | POA: Diagnosis not present

## 2017-07-24 MED ORDER — SODIUM CHLORIDE 0.9% FLUSH
10.0000 mL | INTRAVENOUS | Status: DC | PRN
  Administered 2017-07-24: 10 mL
  Filled 2017-07-24: qty 10

## 2017-07-24 MED ORDER — HEPARIN SOD (PORK) LOCK FLUSH 100 UNIT/ML IV SOLN
INTRAVENOUS | Status: AC
Start: 2017-07-24 — End: 2017-07-24
  Filled 2017-07-24: qty 5

## 2017-07-24 MED ORDER — HEPARIN SOD (PORK) LOCK FLUSH 100 UNIT/ML IV SOLN
500.0000 [IU] | Freq: Once | INTRAVENOUS | Status: AC | PRN
  Administered 2017-07-24: 500 [IU]

## 2017-07-28 DIAGNOSIS — C184 Malignant neoplasm of transverse colon: Secondary | ICD-10-CM | POA: Diagnosis not present

## 2017-08-05 ENCOUNTER — Inpatient Hospital Stay: Payer: Medicare HMO

## 2017-08-05 ENCOUNTER — Encounter: Payer: Self-pay | Admitting: Oncology

## 2017-08-05 ENCOUNTER — Inpatient Hospital Stay (HOSPITAL_BASED_OUTPATIENT_CLINIC_OR_DEPARTMENT_OTHER): Payer: Medicare HMO | Admitting: Oncology

## 2017-08-05 ENCOUNTER — Inpatient Hospital Stay: Payer: Medicare HMO | Attending: Oncology

## 2017-08-05 VITALS — BP 137/80 | HR 75 | Temp 97.2°F | Resp 18 | Wt 150.1 lb

## 2017-08-05 DIAGNOSIS — Z933 Colostomy status: Secondary | ICD-10-CM | POA: Diagnosis not present

## 2017-08-05 DIAGNOSIS — R599 Enlarged lymph nodes, unspecified: Secondary | ICD-10-CM | POA: Insufficient documentation

## 2017-08-05 DIAGNOSIS — Z87891 Personal history of nicotine dependence: Secondary | ICD-10-CM | POA: Insufficient documentation

## 2017-08-05 DIAGNOSIS — D509 Iron deficiency anemia, unspecified: Secondary | ICD-10-CM

## 2017-08-05 DIAGNOSIS — Z79899 Other long term (current) drug therapy: Secondary | ICD-10-CM | POA: Insufficient documentation

## 2017-08-05 DIAGNOSIS — I700 Atherosclerosis of aorta: Secondary | ICD-10-CM | POA: Diagnosis not present

## 2017-08-05 DIAGNOSIS — G8929 Other chronic pain: Secondary | ICD-10-CM | POA: Insufficient documentation

## 2017-08-05 DIAGNOSIS — G620 Drug-induced polyneuropathy: Secondary | ICD-10-CM

## 2017-08-05 DIAGNOSIS — Z86010 Personal history of colonic polyps: Secondary | ICD-10-CM | POA: Diagnosis not present

## 2017-08-05 DIAGNOSIS — Z923 Personal history of irradiation: Secondary | ICD-10-CM

## 2017-08-05 DIAGNOSIS — D3501 Benign neoplasm of right adrenal gland: Secondary | ICD-10-CM

## 2017-08-05 DIAGNOSIS — K219 Gastro-esophageal reflux disease without esophagitis: Secondary | ICD-10-CM | POA: Diagnosis not present

## 2017-08-05 DIAGNOSIS — M5136 Other intervertebral disc degeneration, lumbar region: Secondary | ICD-10-CM

## 2017-08-05 DIAGNOSIS — Z5111 Encounter for antineoplastic chemotherapy: Secondary | ICD-10-CM

## 2017-08-05 DIAGNOSIS — R0609 Other forms of dyspnea: Secondary | ICD-10-CM | POA: Insufficient documentation

## 2017-08-05 DIAGNOSIS — G629 Polyneuropathy, unspecified: Secondary | ICD-10-CM | POA: Diagnosis not present

## 2017-08-05 DIAGNOSIS — F17210 Nicotine dependence, cigarettes, uncomplicated: Secondary | ICD-10-CM | POA: Diagnosis not present

## 2017-08-05 DIAGNOSIS — C184 Malignant neoplasm of transverse colon: Secondary | ICD-10-CM

## 2017-08-05 DIAGNOSIS — M549 Dorsalgia, unspecified: Secondary | ICD-10-CM | POA: Diagnosis not present

## 2017-08-05 DIAGNOSIS — Z853 Personal history of malignant neoplasm of breast: Secondary | ICD-10-CM

## 2017-08-05 DIAGNOSIS — R2681 Unsteadiness on feet: Secondary | ICD-10-CM | POA: Diagnosis not present

## 2017-08-05 DIAGNOSIS — R269 Unspecified abnormalities of gait and mobility: Secondary | ICD-10-CM

## 2017-08-05 DIAGNOSIS — D3502 Benign neoplasm of left adrenal gland: Secondary | ICD-10-CM

## 2017-08-05 DIAGNOSIS — T451X5A Adverse effect of antineoplastic and immunosuppressive drugs, initial encounter: Secondary | ICD-10-CM

## 2017-08-05 DIAGNOSIS — M25569 Pain in unspecified knee: Secondary | ICD-10-CM | POA: Insufficient documentation

## 2017-08-05 DIAGNOSIS — E559 Vitamin D deficiency, unspecified: Secondary | ICD-10-CM

## 2017-08-05 DIAGNOSIS — M1990 Unspecified osteoarthritis, unspecified site: Secondary | ICD-10-CM | POA: Diagnosis not present

## 2017-08-05 DIAGNOSIS — E785 Hyperlipidemia, unspecified: Secondary | ICD-10-CM

## 2017-08-05 DIAGNOSIS — Z800 Family history of malignant neoplasm of digestive organs: Secondary | ICD-10-CM

## 2017-08-05 LAB — COMPREHENSIVE METABOLIC PANEL
ALT: 13 U/L — ABNORMAL LOW (ref 14–54)
ANION GAP: 8 (ref 5–15)
AST: 25 U/L (ref 15–41)
Albumin: 3.1 g/dL — ABNORMAL LOW (ref 3.5–5.0)
Alkaline Phosphatase: 74 U/L (ref 38–126)
BUN: 9 mg/dL (ref 6–20)
CHLORIDE: 100 mmol/L — AB (ref 101–111)
CO2: 25 mmol/L (ref 22–32)
Calcium: 8.8 mg/dL — ABNORMAL LOW (ref 8.9–10.3)
Creatinine, Ser: 0.74 mg/dL (ref 0.44–1.00)
GFR calc Af Amer: >60 mL/min (ref >60)
Glucose, Bld: 145 mg/dL — ABNORMAL HIGH (ref 65–99)
POTASSIUM: 3.9 mmol/L (ref 3.5–5.1)
Sodium: 133 mmol/L — ABNORMAL LOW (ref 135–145)
Total Bilirubin: 0.4 mg/dL (ref 0.3–1.2)
Total Protein: 6.7 g/dL (ref 6.5–8.1)

## 2017-08-05 LAB — CBC WITH DIFFERENTIAL/PLATELET
Basophils Absolute: 0.2 10*3/uL — ABNORMAL HIGH (ref 0–0.1)
Basophils Relative: 2 %
EOS PCT: 3 %
Eosinophils Absolute: 0.2 10*3/uL (ref 0–0.7)
HEMATOCRIT: 39.8 % (ref 35.0–47.0)
HEMOGLOBIN: 13.3 g/dL (ref 12.0–16.0)
LYMPHS ABS: 1.7 10*3/uL (ref 1.0–3.6)
LYMPHS PCT: 21 %
MCH: 32.8 pg (ref 26.0–34.0)
MCHC: 33.5 g/dL (ref 32.0–36.0)
MCV: 97.9 fL (ref 80.0–100.0)
Monocytes Absolute: 1.3 10*3/uL — ABNORMAL HIGH (ref 0.2–0.9)
Monocytes Relative: 17 %
NEUTROS ABS: 4.6 10*3/uL (ref 1.4–6.5)
NEUTROS PCT: 57 %
Platelets: 266 10*3/uL (ref 150–440)
RBC: 4.07 MIL/uL (ref 3.80–5.20)
RDW: 19 % — ABNORMAL HIGH (ref 11.5–14.5)
WBC: 8 10*3/uL (ref 3.6–11.0)

## 2017-08-05 LAB — MAGNESIUM: Magnesium: 1.7 mg/dL (ref 1.7–2.4)

## 2017-08-05 MED ORDER — LEUCOVORIN CALCIUM INJECTION 350 MG: 400.0000 mg/m2 | Freq: Once | INTRAVENOUS | Status: DC

## 2017-08-05 MED ORDER — SODIUM CHLORIDE 0.9 % IV SOLN
2400.0000 mg/m2 | INTRAVENOUS | Status: DC
  Administered 2017-08-05: 4200 mg via INTRAVENOUS
  Filled 2017-08-05: qty 84

## 2017-08-05 MED ORDER — FLUOROURACIL CHEMO INJECTION 2.5 GM/50ML
400.0000 mg/m2 | Freq: Once | INTRAVENOUS | Status: AC
  Administered 2017-08-05: 700 mg via INTRAVENOUS
  Filled 2017-08-05: qty 14

## 2017-08-05 MED ORDER — DEXTROSE 5 % IV SOLN
Freq: Once | INTRAVENOUS | Status: AC
  Administered 2017-08-05: 11:00:00 via INTRAVENOUS
  Filled 2017-08-05: qty 1000

## 2017-08-05 MED ORDER — LEUCOVORIN CALCIUM INJECTION 100 MG
20.0000 mg/m2 | Freq: Once | INTRAMUSCULAR | Status: AC
  Administered 2017-08-05: 34 mg via INTRAVENOUS
  Filled 2017-08-05: qty 1.7

## 2017-08-05 MED ORDER — PALONOSETRON HCL INJECTION 0.25 MG/5ML
0.2500 mg | Freq: Once | INTRAVENOUS | Status: AC
  Administered 2017-08-05: 0.25 mg via INTRAVENOUS
  Filled 2017-08-05: qty 5

## 2017-08-05 NOTE — Progress Notes (Signed)
[facility] Initial Visit:  Patient Care Team: Birdie Sons, MD as PCP - General (Family Medicine)  PURPOSE OF VISIT: Adjuvant chemotherapy for colon cancer  HISTORY OF PRESENTING ILLNESS: Kimberly [name] 71 y.o. female with PMH listed as below presents for follow up for the management of Stage IIIB Colon Cancer Patient reports a remote history of breast cancer s/p lumpectomy and radiation treatments. Patient had been having abdominal pain, weight loss and bloating.  CT scan showed partial obstruction at the level of transverse colon and ileocolic mesenteric adenopathy. She was prepared for a colonoscopy on 04/15/2017. She started to have worsened abdominal pain, nausea vomiting, unable to maintain oral intake and was sent to ED. She was found to have bowel obstruction and underwent  exploratory laparotomy with transverse colectomy, with end colostomy and mucous fistula formation for obstructing colon lesion. Differential diagnosis prior to surgery was metastatic breast cancer in colon vs colon cancer. The patient did have a colonoscopy 2 years ago without the mass being present at that time but did have 2 small polyps in the transverse colon that were removed.  04/15/2017 Patient underwent transverse colon resection.  Pathology showed T4aN1 moderate differentiated adenocarcinoma, negative margin, 3/16 lymph nodes involved with cancer.  04/21/2017 She was seen in the ER earlier this week due to colostomy wound infection lateral to her right-sided end colostomy and to the midsection of her midline incision. She was started on antibiotics for cellulitis   INTERVAL HISTORY Patient presents for evaluation prior to her cycle 6 FOLFOX.   She tolerate chemotherapy well except that she mentioned for the past week she has intermittent episodes of unsteady gait, and leaning towards her left. Episode resolved spontaneously. Today she denies any gait problem. She denies any focal weakness,  slurred speech, headache, double vision, or blurred vision. Denies room spinning sensation or Tinnitus She has pre-existing neuropathy which was worse while on oxaliplatin. Oxaliplatin was discontinued, and her neuropathy has returned to baseline. She has chronic neuropathy on the bottom of her feet. She has taken Neurontin previously which causes memory loss. During last visit Cymbalta was prescribed by Dr. Janese Banks for her neuropathy. Patient took 1 dose and self stopped. She feels that taking Cymbalta worsen her neuropathy.  She has stopped Lasix which was prescribed after her colon surgery due to bilateral leg swelling. She denies any previous cardiac history. Denies any leg edema.   Review of Systems - Oncology Constitutional: Negative for fever, night sweats,change in appetite. unintentional weight loss,  HENT: Negative for ear pain, hearing loss, nasal bleeding Eyes: Negative for eye pain, double vision   Respiratory: Negative for wheezing, shortness of breath, cough Cardiovascular: Negative for chest pain, palpitation.   Gastrointestinal: Negative abdominal pain, diarrhea, nausea vomiting Endocrine: Negative  Genitourinary: Negative for dysuria, hematuria, frequency Skin: Negative for rash, iching, bruising Neurological: Negative for headache,seizure. Positive for intermittent unsteady gait/dizziness. Hematological: Negative for easy bruising/bleeding, lymph node enlargement Psychiatric/Behavioral: Negative for depression, anxiety, suicidality  MEDICAL HISTORY: Past Medical History:  Diagnosis Date  . Breast cancer ([place])   . Breast cancer, left ([facility])    Lumpectomy and rad tx's.  . Chronic back pain   . Chronic knee pain   . Colon cancer ([place])   . Degenerative disc disease, lumbar 04/2013  . Dyspnea   . Family history of adverse reaction to anesthesia    son arrested after anesthesia  . GERD (gastroesophageal reflux disease)   . Hyperlipidemia   . Iron  deficiency anemia 05/27/2017   . Neuropathy   . Osteoarthritis    of knee  . Osteoporosis   . Tobacco abuse   . Vitamin D deficiency     SURGICAL HISTORY: Past Surgical History:  Procedure Laterality Date  . APPENDECTOMY  1965  . BREAST EXCISIONAL BIOPSY Left 08/01/2003   lumpectomy rad 11/04-2/28/2005  . CESAREAN SECTION     x3  . COLON SURGERY    . COLONOSCOPY W/ POLYPECTOMY    . HIP FRACTURE SURGERY Left 01/25/2012  . NECK SURGERY  12/2011  . WRIST FRACTURE SURGERY      SOCIAL HISTORY: Social History   Socioeconomic History  . Marital status: Divorced    Spouse name: Not on file  . Number of children: Not on file  . Years of education: Not on file  . Highest education level: Not on file  Social Needs  . Financial resource strain: Not on file  . Food insecurity - worry: Not on file  . Food insecurity - inability: Not on file  . Transportation needs - medical: Not on file  . Transportation needs - non-medical: Not on file  Occupational History  . Not on file  Tobacco Use  . Smoking status: Former Smoker    Packs/day: 0.25    Years: 55.00    Pack years: 13.75    Types: Cigarettes    Last attempt to quit: 05/21/2017    Years since quitting: 0.2  . Smokeless tobacco: Never Used  . Tobacco comment: started age 46 1/2 to 1 ppd  Substance and Sexual Activity  . Alcohol use: Yes    Alcohol/week: 0.0 oz    Comment: occasionally drinks beer  . Drug use: No  . Sexual activity: No  Other Topics Concern  . Not on file  Social History Narrative  . Not on file    FAMILY HISTORY Family History  Problem Relation Age of Onset  . Diabetes Mother        type 2  . Coronary artery disease Mother   . Deep vein thrombosis Mother   . Colon cancer Father   . Asthma Brother   . Diabetes Brother        type 2    ALLERGIES:  is allergic to iodine; other; and sinus formula [cholestatin].  MEDICATIONS:  Current Outpatient Medications  Medication Sig Dispense Refill  . budesonide-formoterol  (SYMBICORT) 80-4.5 MCG/ACT inhaler Inhale 2 puffs into the lungs 2 (two) times daily.    . Calcium Citrate-Vitamin D (CITRACAL/VITAMIN D PO) Take 1 tablet by mouth daily.     . DULoxetine (CYMBALTA) 30 MG capsule Take 1 capsule (30 mg total) by mouth as directed. 1 tablet daily x7 days then 2 tablets daily 53 capsule 0  . feeding supplement (BOOST / RESOURCE BREEZE) LIQD Take 1 Container by mouth 3 (three) times daily between meals. (Patient taking differently: Take 1 Container by mouth 2 (two) times daily. ) 30 Container 0  . furosemide (LASIX) 20 MG tablet Take 1 tablet (20 mg total) by mouth daily. (Patient not taking: Reported on 07/22/2017) 90 tablet 4  . HYDROcodone-acetaminophen (NORCO) 7.5-325 MG tablet Take 1-2 tablets by mouth every 6 (six) hours as needed for moderate pain. 120 tablet 0  . ibuprofen (ADVIL,MOTRIN) 200 MG tablet Take 400 mg by mouth daily as needed for headache or moderate pain.    Marland Kitchen lidocaine-prilocaine (EMLA) cream Apply to affected area once 30 g 3  . LORazepam (ATIVAN) 0.5 MG tablet  Take 1 tablet (0.5 mg total) by mouth every 6 (six) hours as needed (Nausea or vomiting). 30 tablet 0  . magnesium chloride (SLOW-MAG) 64 MG TBEC SR tablet Take 1 tablet (64 mg total) by mouth daily. 30 tablet 3  . meloxicam (MOBIC) 15 MG tablet TAKE 1 TABLET EVERY DAY AS NEEDED (Patient taking differently: take 50m by mouth daily) 90 tablet 3  . omeprazole (PRILOSEC) 20 MG capsule TAKE 1 CAPSULE EVERY DAY 90 capsule 4  . ondansetron (ZOFRAN) 8 MG tablet Take 1 tablet (8 mg total) by mouth 2 (two) times daily as needed for refractory nausea / vomiting. Start on day 3 after chemotherapy. 30 tablet 1  . potassium chloride (K-DUR) 10 MEQ tablet Take 1 tablet (10 mEq total) by mouth daily. (Patient not taking: Reported on 07/22/2017) 30 tablet 0  . pravastatin (PRAVACHOL) 40 MG tablet TAKE 1 TABLET (40 MG TOTAL) BY MOUTH DAILY. 90 tablet 4  . Probiotic Product (PROBIOTIC DAILY PO) Take 1 capsule  by mouth daily.     . prochlorperazine (COMPAZINE) 10 MG tablet Take 1 tablet (10 mg total) by mouth every 6 (six) hours as needed (Nausea or vomiting). 30 tablet 1  . pyridOXINE (VITAMIN B-6) 50 MG tablet Take 1 tablet (50 mg total) by mouth daily. 30 tablet 3  . simethicone (MYLICON) 80 MG chewable tablet Chew 2 tablets (160 mg total) by mouth every 6 (six) hours as needed for flatulence. 120 tablet 0  . Triamcinolone Acetonide (NASACORT ALLERGY 24HR NA) Place 1 spray into the nose daily as needed (allergies).    . varenicline (CHANTIX CONTINUING MONTH PAK) 1 MG tablet Take 1 tablet (1 mg total) by mouth 2 (two) times daily. 60 tablet 2   No current facility-administered medications for this visit.    Facility-Administered Medications Ordered in Other Visits  Medication Dose Route Frequency Provider Last Rate Last Dose  . 0.9 %  sodium chloride infusion   Intravenous Continuous YEarlie Server MD   Stopped at 07/22/17 1150  . heparin lock flush 100 unit/mL  500 Units Intravenous Once YEarlie Server MD      . heparin lock flush 100 unit/mL  500 Units Intravenous Once YEarlie Server MD      . heparin lock flush 100 unit/mL  500 Units Intravenous Once YEarlie Server MD      . sodium chloride flush (NS) 0.9 % injection 10 mL  10 mL Intracatheter PRN YEarlie Server MD      . sodium chloride flush (NS) 0.9 % injection 10 mL  10 mL Intravenous PRN YEarlie Server MD   10 mL at 05/29/17 1347    PHYSICAL EXAMINATION:  ECOG PERFORMANCE STATUS: 0 - Asymptomatic  Vitals:   08/05/17 0958  BP: 137/80  Pulse: 75  Resp: 18  Temp: (!) 97.2 F (36.2 C)   Filed Weights   08/05/17 0958  Weight: 150 lb 2 oz (68.1 kg)    Physical Exam  GENERAL: No distress, well nourished.  SKIN:  No rashes or significant lesions  HEAD: Normocephalic, No masses, lesions, tenderness or abnormalities  EYES: Conjunctiva are pink, non icteric ENT: External ears normal ,lips , buccal mucosa, and tongue normal and mucous membranes are moist  LYMPH:  No palpable cervical and axillary lymphadenopathy  LUNGS: Clear to auscultation, no crackles or wheezes HEART: Regular rate & rhythm, no murmurs, no gallops, S1 normal and S2 normal  ABDOMEN: Abdomen soft, non-tender, normal bowel sounds, (+) colostomy bag MUSCULOSKELETAL: No CVA  tenderness and no tenderness on percussion of the back or rib cage.  EXTREMITIES: No edema, no skin discoloration or tenderness NEURO: Alert & oriented, no focal motor/sensory deficits. Steady gait, no nystagmus.     LABORATORY DATA: I have personally reviewed the data as listed: CBC    Component Value Date/Time   WBC 9.4 07/22/2017 0922   RBC 3.93 07/22/2017 0922   HGB 12.9 07/22/2017 0922   HGB 12.2 01/02/2017 1410   HCT 38.0 07/22/2017 0922   HCT 37.3 01/02/2017 1410   PLT 219 07/22/2017 0922   PLT 486 (H) 01/02/2017 1410   MCV 96.8 07/22/2017 0922   MCV 86 01/02/2017 1410   MCV 96 01/29/2012 0428   MCH 32.8 07/22/2017 0922   MCHC 33.9 07/22/2017 0922   RDW 19.0 (H) 07/22/2017 0922   RDW 16.1 (H) 01/02/2017 1410   RDW 12.7 01/29/2012 0428   LYMPHSABS 2.4 07/22/2017 0922   LYMPHSABS 3.9 (H) 01/02/2017 1410   LYMPHSABS 1.9 01/29/2012 0428   MONOABS 1.8 (H) 07/22/2017 0922   MONOABS 2.0 (H) 01/29/2012 0428   EOSABS 0.2 07/22/2017 0922   EOSABS 0.4 01/02/2017 1410   EOSABS 0.1 01/29/2012 0428   BASOSABS 0.1 07/22/2017 0922   BASOSABS 0.1 01/02/2017 1410   BASOSABS 0.1 01/29/2012 0428   CMP Latest Ref Rng & Units 07/22/2017 07/08/2017 06/24/2017  Glucose 65 - 99 mg/dL 112(H) 116(H) 112(H)  BUN 6 - 20 mg/dL '8 12 8  ' Creatinine 0.44 - 1.00 mg/dL 0.60 0.73 0.75  Sodium 135 - 145 mmol/L 136 136 138  Potassium 3.5 - 5.1 mmol/L 3.8 3.4(L) 3.4(L)  Chloride 101 - 111 mmol/L 105 107 106  CO2 22 - 32 mmol/L 23 21(L) 23  Calcium 8.9 - 10.3 mg/dL 8.2(L) 8.9 8.4(L)  Total Protein 6.5 - 8.1 g/dL 6.2(L) 6.1(L) 6.6  Total Bilirubin 0.3 - 1.2 mg/dL 0.4 0.3 0.3  Alkaline Phos 38 - 126 U/L 75 77 76  AST 15 -  41 U/L '21 26 22  ' ALT 14 - 54 U/L 12(L) 17 15    RADIOGRAPHIC STUDIES: I have personally reviewed the radiological images as listed and agree with the findings in the report 04/08/2017 CT abdomen pelvis w contrast IMPRESSION: 1. Partial obstruction at the level of the transverse colon, likely secondary to carcinoma. 2. Ileocolic mesenteric adenopathy, suspicious for nodal metastasis. 3.  Aortic Atherosclerosis (ICD10-I70.0). 4. Bilateral adrenal adenomas. 04/21/2017 CT abdomen pelvis with contrast. IMPRESSION: Postsurgical changes consistent with the given clinical history. A small amount of fluid in a year is noted surrounding the right lower quadrant ostomy likely felt to be postoperative in nature. No definitive abscess is seen. The patient's palpable abnormality likely corresponds to the small fluid collection.  PATHOLOGY DIAGNOSIS:  A. COLON MASS, TRANSVERSE; TRANSVERSE COLECTOMY:  - INVASIVE ADENOCARCINOMA, MODERATELY DIFFERENTIATED.  - THREE OF SIXTEEN LYMPH NODES INVOLVED BY METASTASIS (3/16).  - LYMPHOVASCULAR AND PERINEURAL INVASION PRESENT.  - SEE SUMMARY BELOW.  Surgical Pathology Cancer Case Summary  COLON AND RECTUM:  Procedure: transverse colectomy  Tumor Site:  transverse colon  Tumor Size: Greatest dimension: 2.3 cm  Macroscopic Tumor Perforation: not specified  Histologic Type: adenocarcinoma  Histologic Grade: G2, moderately differentiated  Tumor Extension: tumor invades the visceral peritoneum  Margins: all margins are uninvolved by invasive carcinoma, high-grade dysplasia, intramucosal adenocarcinoma, and adenoma  Treatment Effect: no known presurgical therapy  Lymphovascular Invasion: present  Perineural Invasion: present  Tumor Deposits: not identified  Regional Lymph Nodes: # examined: 16  #  involved: 3  Pathologic Stage Classification (pTNM, AJCC 8th Edition): pT4a pN1b   ADDENDUM:   MICROSATELLITE INSTABILITY IMMUNOHISTOCHEMISTRY  MISMATCH  REPAIR PROTEINS:   MLH1: Intact nuclear expression  MSH2: Intact nuclear expression  MSH6: Intact nuclear expression  PMS2: Intact nuclear expression   Interpretation: No loss of nuclear expression of mismatch repair  proteins: Low probability of MSI-H.   Addendum: Genetic test INVITAE came back negative. No pathogenetic sequence variants or deletions/duplications identified. Results was scanned to [employer] (a copy of the report was provided to patient and her daughter)  ASSESSMENT/PLAN 71yo female who has MSI  stable stage IIIB Colon Cancer starting adjuvant 5-FU # baseline CEA is 2.1  Cancer Staging Malignant neoplasm of transverse colon [facility]) Staging form: Colon and Rectum, AJCC 8th Edition - Clinical stage from 04/15/2017: Stage IIIB (cT4a, cN1b, cM0) - Signed by Earlie Server, MD on 04/26/2017  1. Unsteady gait   2. Malignant neoplasm of transverse colon ([place])   3. Encounter for antineoplastic chemotherapy   4. Chemotherapy-induced neuropathy ([facility])   5. Personal history of malignant neoplasm of breast   6. Gait difficulty    # s/p 3 cycles of FOLFOX (Reduce oxlaiplatin dose to 67m/m2 due to neuropathy), Goal of care is curative intent. Oxaliplatin was discontinued after 3 cycles due to worsening of pre-existing neuropathy.  # Proceed to cycle 6 5-FU today.   # As she has T4 disease, after she finishes systemic chemotherapy, I think she would benefit from radiation to reduce local recurrence. Will refer to RadOnc after finishing adjuvant chemotherapy.   # Chronic hypomagnesia: resolved. Magnesium at 1.7. Continue oral SLOW Mag 1 tablet daily.  # Hypokalemia: resolved after lasix was discontinued.  # genetic testing results were discussed with patient and her daughter.  # Grade 2 neuropathy: self discontinued Cymbalta due to worsening of neuropathy after one dose. She is not interested taking it. Continue B6, increase to 100 mg daily. Oxaliplatin is discontinued. Neuropathy is back to  her pre-existing baseline.  # Gait instability, intermittent, benign neurological exam. Will check CT brain wo.  All questions were answered. The patient knows to call the clinic with any problems, questions or concerns.  Follow up in 2 weeks on day 1 of cycle 7  Dr. ZEarlie Server MD, PhD CEnd[facility] Of [facility]calaat ACloud County Health CenterPager- [phone]/02/2017     Dr.Malynda Smolinski YTasia Catchings

## 2017-08-05 NOTE — Progress Notes (Signed)
Pt in today for md, lab and chemo.  Pt states some dizziness upon standing.  Check bp standing 134/81.  Pt states stopped taking cymbalta.  Reports not taking potassium at this time and is taking slow mag.  Appetite decreased but drinking 2 boost a day.

## 2017-08-07 ENCOUNTER — Inpatient Hospital Stay: Payer: Medicare HMO

## 2017-08-07 ENCOUNTER — Other Ambulatory Visit: Payer: Self-pay | Admitting: Family Medicine

## 2017-08-07 ENCOUNTER — Other Ambulatory Visit: Payer: Self-pay | Admitting: Oncology

## 2017-08-07 VITALS — BP 138/85 | HR 69 | Temp 97.3°F | Resp 18

## 2017-08-07 DIAGNOSIS — D3501 Benign neoplasm of right adrenal gland: Secondary | ICD-10-CM | POA: Diagnosis not present

## 2017-08-07 DIAGNOSIS — M25569 Pain in unspecified knee: Secondary | ICD-10-CM | POA: Diagnosis not present

## 2017-08-07 DIAGNOSIS — D3502 Benign neoplasm of left adrenal gland: Secondary | ICD-10-CM | POA: Diagnosis not present

## 2017-08-07 DIAGNOSIS — Z5111 Encounter for antineoplastic chemotherapy: Secondary | ICD-10-CM | POA: Diagnosis not present

## 2017-08-07 DIAGNOSIS — C184 Malignant neoplasm of transverse colon: Secondary | ICD-10-CM

## 2017-08-07 DIAGNOSIS — I700 Atherosclerosis of aorta: Secondary | ICD-10-CM | POA: Diagnosis not present

## 2017-08-07 DIAGNOSIS — R599 Enlarged lymph nodes, unspecified: Secondary | ICD-10-CM | POA: Diagnosis not present

## 2017-08-07 DIAGNOSIS — G629 Polyneuropathy, unspecified: Secondary | ICD-10-CM | POA: Diagnosis not present

## 2017-08-07 DIAGNOSIS — R2681 Unsteadiness on feet: Secondary | ICD-10-CM | POA: Diagnosis not present

## 2017-08-07 MED ORDER — HEPARIN SOD (PORK) LOCK FLUSH 100 UNIT/ML IV SOLN
500.0000 [IU] | Freq: Once | INTRAVENOUS | Status: AC | PRN
  Administered 2017-08-07: 500 [IU]
  Filled 2017-08-07: qty 5

## 2017-08-08 ENCOUNTER — Other Ambulatory Visit: Payer: Self-pay | Admitting: Family Medicine

## 2017-08-08 DIAGNOSIS — M5136 Other intervertebral disc degeneration, lumbar region: Secondary | ICD-10-CM

## 2017-08-11 ENCOUNTER — Other Ambulatory Visit: Payer: Self-pay | Admitting: Oncology

## 2017-08-11 MED ORDER — HYDROCODONE-ACETAMINOPHEN 7.5-325 MG PO TABS: 1.0000 | ORAL_TABLET | Freq: Four times a day (QID) | ORAL | 0 refills | Status: DC | PRN

## 2017-08-11 NOTE — Telephone Encounter (Signed)
Patient requesting refills. Thanks!  

## 2017-08-12 ENCOUNTER — Ambulatory Visit: Payer: Medicare HMO

## 2017-08-12 DIAGNOSIS — Z933 Colostomy status: Secondary | ICD-10-CM | POA: Diagnosis not present

## 2017-08-12 DIAGNOSIS — C189 Malignant neoplasm of colon, unspecified: Secondary | ICD-10-CM | POA: Diagnosis not present

## 2017-08-14 ENCOUNTER — Ambulatory Visit
Admission: RE | Admit: 2017-08-14 | Discharge: 2017-08-14 | Disposition: A | Discharge status: Home / Self Care | Payer: Medicare HMO | Source: Ambulatory Visit | Attending: Oncology | Admitting: Oncology

## 2017-08-14 DIAGNOSIS — R2681 Unsteadiness on feet: Secondary | ICD-10-CM | POA: Diagnosis not present

## 2017-08-18 ENCOUNTER — Other Ambulatory Visit: Payer: Self-pay | Admitting: Oncology

## 2017-08-18 ENCOUNTER — Inpatient Hospital Stay (HOSPITAL_BASED_OUTPATIENT_CLINIC_OR_DEPARTMENT_OTHER): Payer: Medicare HMO | Admitting: Oncology

## 2017-08-18 ENCOUNTER — Inpatient Hospital Stay: Payer: Medicare HMO

## 2017-08-18 ENCOUNTER — Other Ambulatory Visit: Payer: Self-pay

## 2017-08-18 ENCOUNTER — Encounter: Payer: Self-pay | Admitting: Oncology

## 2017-08-18 VITALS — BP 159/84 | HR 80 | Temp 97.7°F | Wt 155.4 lb

## 2017-08-18 DIAGNOSIS — C184 Malignant neoplasm of transverse colon: Secondary | ICD-10-CM

## 2017-08-18 DIAGNOSIS — Z79899 Other long term (current) drug therapy: Secondary | ICD-10-CM

## 2017-08-18 DIAGNOSIS — R0609 Other forms of dyspnea: Secondary | ICD-10-CM

## 2017-08-18 DIAGNOSIS — K219 Gastro-esophageal reflux disease without esophagitis: Secondary | ICD-10-CM

## 2017-08-18 DIAGNOSIS — Z800 Family history of malignant neoplasm of digestive organs: Secondary | ICD-10-CM

## 2017-08-18 DIAGNOSIS — Z5111 Encounter for antineoplastic chemotherapy: Secondary | ICD-10-CM

## 2017-08-18 DIAGNOSIS — E559 Vitamin D deficiency, unspecified: Secondary | ICD-10-CM

## 2017-08-18 DIAGNOSIS — G629 Polyneuropathy, unspecified: Secondary | ICD-10-CM | POA: Diagnosis not present

## 2017-08-18 DIAGNOSIS — M549 Dorsalgia, unspecified: Secondary | ICD-10-CM

## 2017-08-18 DIAGNOSIS — Z933 Colostomy status: Secondary | ICD-10-CM

## 2017-08-18 DIAGNOSIS — R599 Enlarged lymph nodes, unspecified: Secondary | ICD-10-CM

## 2017-08-18 DIAGNOSIS — G8929 Other chronic pain: Secondary | ICD-10-CM | POA: Diagnosis not present

## 2017-08-18 DIAGNOSIS — M25569 Pain in unspecified knee: Secondary | ICD-10-CM

## 2017-08-18 DIAGNOSIS — Z86010 Personal history of colonic polyps: Secondary | ICD-10-CM

## 2017-08-18 DIAGNOSIS — R2681 Unsteadiness on feet: Secondary | ICD-10-CM | POA: Diagnosis not present

## 2017-08-18 DIAGNOSIS — D3501 Benign neoplasm of right adrenal gland: Secondary | ICD-10-CM

## 2017-08-18 DIAGNOSIS — D3502 Benign neoplasm of left adrenal gland: Secondary | ICD-10-CM | POA: Diagnosis not present

## 2017-08-18 DIAGNOSIS — M1990 Unspecified osteoarthritis, unspecified site: Secondary | ICD-10-CM

## 2017-08-18 DIAGNOSIS — Z923 Personal history of irradiation: Secondary | ICD-10-CM

## 2017-08-18 DIAGNOSIS — Z87891 Personal history of nicotine dependence: Secondary | ICD-10-CM

## 2017-08-18 DIAGNOSIS — I700 Atherosclerosis of aorta: Secondary | ICD-10-CM

## 2017-08-18 DIAGNOSIS — Z853 Personal history of malignant neoplasm of breast: Secondary | ICD-10-CM

## 2017-08-18 DIAGNOSIS — F17210 Nicotine dependence, cigarettes, uncomplicated: Secondary | ICD-10-CM

## 2017-08-18 DIAGNOSIS — E785 Hyperlipidemia, unspecified: Secondary | ICD-10-CM

## 2017-08-18 DIAGNOSIS — D509 Iron deficiency anemia, unspecified: Secondary | ICD-10-CM

## 2017-08-18 DIAGNOSIS — M5136 Other intervertebral disc degeneration, lumbar region: Secondary | ICD-10-CM

## 2017-08-18 LAB — CBC WITH DIFFERENTIAL/PLATELET
BASOS PCT: 1 %
Basophils Absolute: 0.1 10*3/uL (ref 0–0.1)
EOS PCT: 3 %
Eosinophils Absolute: 0.3 10*3/uL (ref 0–0.7)
HEMATOCRIT: 38.4 % (ref 35.0–47.0)
Hemoglobin: 13.5 g/dL (ref 12.0–16.0)
LYMPHS PCT: 24 %
Lymphs Abs: 2.5 10*3/uL (ref 1.0–3.6)
MCH: 34.3 pg — ABNORMAL HIGH (ref 26.0–34.0)
MCHC: 35.1 g/dL (ref 32.0–36.0)
MCV: 97.8 fL (ref 80.0–100.0)
MONO ABS: 1.7 10*3/uL — AB (ref 0.2–0.9)
MONOS PCT: 16 %
NEUTROS ABS: 5.5 10*3/uL (ref 1.4–6.5)
NEUTROS PCT: 56 %
Platelets: 256 10*3/uL (ref 150–440)
RBC: 3.93 MIL/uL (ref 3.80–5.20)
RDW: 18.7 % — AB (ref 11.5–14.5)
WBC: 10.1 10*3/uL (ref 3.6–11.0)

## 2017-08-18 LAB — MAGNESIUM: Magnesium: 1.9 mg/dL (ref 1.7–2.4)

## 2017-08-18 LAB — COMPREHENSIVE METABOLIC PANEL
ALT: 12 U/L — ABNORMAL LOW (ref 14–54)
ANION GAP: 3 — AB (ref 5–15)
AST: 20 U/L (ref 15–41)
Albumin: 3.2 g/dL — ABNORMAL LOW (ref 3.5–5.0)
Alkaline Phosphatase: 71 U/L (ref 38–126)
BILIRUBIN TOTAL: 0.3 mg/dL (ref 0.3–1.2)
BUN: 18 mg/dL (ref 6–20)
CHLORIDE: 100 mmol/L — AB (ref 101–111)
CO2: 31 mmol/L (ref 22–32)
Calcium: 8.6 mg/dL — ABNORMAL LOW (ref 8.9–10.3)
Creatinine, Ser: 0.77 mg/dL (ref 0.44–1.00)
Glucose, Bld: 118 mg/dL — ABNORMAL HIGH (ref 65–99)
POTASSIUM: 3.9 mmol/L (ref 3.5–5.1)
Sodium: 134 mmol/L — ABNORMAL LOW (ref 135–145)
Total Protein: 6.6 g/dL (ref 6.5–8.1)

## 2017-08-18 MED ORDER — HEPARIN SOD (PORK) LOCK FLUSH 100 UNIT/ML IV SOLN: 500.0000 [IU] | Freq: Once | INTRAVENOUS | Status: DC

## 2017-08-18 MED ORDER — LEUCOVORIN CALCIUM INJECTION 100 MG
20.0000 mg/m2 | Freq: Once | INTRAMUSCULAR | Status: AC
Start: 2017-08-18 — End: 2017-08-18
  Administered 2017-08-18: 34 mg via INTRAVENOUS
  Filled 2017-08-18: qty 1.7

## 2017-08-18 MED ORDER — PALONOSETRON HCL INJECTION 0.25 MG/5ML
0.2500 mg | Freq: Once | INTRAVENOUS | Status: AC
  Administered 2017-08-18: 0.25 mg via INTRAVENOUS
  Filled 2017-08-18: qty 5

## 2017-08-18 MED ORDER — DEXTROSE 5 % IV SOLN
Freq: Once | INTRAVENOUS | Status: AC
  Administered 2017-08-18: 12:00:00 via INTRAVENOUS
  Filled 2017-08-18: qty 1000

## 2017-08-18 MED ORDER — FLUOROURACIL CHEMO INJECTION 2.5 GM/50ML
400.0000 mg/m2 | Freq: Once | INTRAVENOUS | Status: AC
  Administered 2017-08-18: 700 mg via INTRAVENOUS
  Filled 2017-08-18: qty 14

## 2017-08-18 MED ORDER — SODIUM CHLORIDE 0.9% FLUSH
10.0000 mL | INTRAVENOUS | Status: DC | PRN
  Administered 2017-08-18: 10 mL via INTRAVENOUS
  Filled 2017-08-18: qty 10

## 2017-08-18 MED ORDER — SODIUM CHLORIDE 0.9 % IV SOLN
2400.0000 mg/m2 | INTRAVENOUS | Status: DC
  Administered 2017-08-18: 4200 mg via INTRAVENOUS
  Filled 2017-08-18: qty 84

## 2017-08-18 MED ORDER — LEUCOVORIN CALCIUM INJECTION 350 MG: 400.0000 mg/m2 | Freq: Once | INTRAVENOUS | Status: DC

## 2017-08-18 NOTE — Progress Notes (Signed)
Patient here today for follow up.  Patient states no new concerns today  

## 2017-08-18 NOTE — Progress Notes (Signed)
[facility] Initial Visit:  Patient Care Team: Birdie Sons, MD as PCP - General (Family Medicine)  PURPOSE OF VISIT: Adjuvant chemotherapy for colon cancer  HISTORY OF PRESENTING ILLNESS: Kimberly [name] 71 y.o. female with PMH listed as below presents for follow up for the management of Stage IIIB Colon Cancer Patient reports a remote history of breast cancer s/p lumpectomy and radiation treatments. Patient had been having abdominal pain, weight loss and bloating.  CT scan showed partial obstruction at the level of transverse colon and ileocolic mesenteric adenopathy. She was prepared for a colonoscopy on 04/15/2017. She started to have worsened abdominal pain, nausea vomiting, unable to maintain oral intake and was sent to ED. She was found to have bowel obstruction and underwent  exploratory laparotomy with transverse colectomy, with end colostomy and mucous fistula formation for obstructing colon lesion. Differential diagnosis prior to surgery was metastatic breast cancer in colon vs colon cancer. The patient did have a colonoscopy 2 years ago without the mass being present at that time but did have 2 small polyps in the transverse colon that were removed.  04/15/2017 Patient underwent transverse colon resection.  Pathology showed T4aN1 moderate differentiated adenocarcinoma, negative margin, 3/16 lymph nodes involved with cancer.  04/21/2017 She was seen in the ER earlier this week due to colostomy wound infection lateral to her right-sided end colostomy and to the midsection of her midline incision. She was started on antibiotics for cellulitis   TREATMENT: adjuvant FOLFOX, Oxaliplatin was discontinued after 3 cycles due to worsening of pre-existing neuropathy.   INTERVAL HISTORY Patient presents for evaluation prior to her cycle 7 FOLFOX.  Patient tolerated chemotherapy well except she mention her stoma had some drainage and foul smelling. Denies fever or chills. She has  chronic sensory neuropathy prior to the treatment and today she feels that it has been at baseline. Denies shortness of breath, chest pain, abdominal pain, leg swelling. Review of Systems  Constitutional: Negative for appetite change and chills.  HENT:   Negative for hearing loss.   Eyes: Negative for eye problems.  Respiratory: Negative for chest tightness.   Gastrointestinal: Negative for abdominal distention.       Stoma drainage and foul smelling.  Endocrine: Negative for hot flashes.  Genitourinary: Negative for difficulty urinating.   Musculoskeletal: Negative for arthralgias.  Skin: Negative for itching.  Neurological: Negative for dizziness.  Hematological: Negative for adenopathy.  Psychiatric/Behavioral: The patient is not nervous/anxious.       MEDICAL HISTORY: Past Medical History:  Diagnosis Date  . Breast cancer ([place])   . Breast cancer, left ([facility])    Lumpectomy and rad tx's.  . Chronic back pain   . Chronic knee pain   . Colon cancer ([place])   . Degenerative disc disease, lumbar 04/2013  . Dyspnea   . Family history of adverse reaction to anesthesia    son arrested after anesthesia  . GERD (gastroesophageal reflux disease)   . Hyperlipidemia   . Iron deficiency anemia 05/27/2017  . Neuropathy   . Osteoarthritis    of knee  . Osteoporosis   . Tobacco abuse   . Vitamin D deficiency     SURGICAL HISTORY: Past Surgical History:  Procedure Laterality Date  . APPENDECTOMY  1965  . BREAST EXCISIONAL BIOPSY Left 08/01/2003   lumpectomy rad 11/04-2/28/2005  . CESAREAN SECTION     x3  . COLON SURGERY    . COLONOSCOPY W/ POLYPECTOMY    . ESOPHAGOGASTRODUODENOSCOPY (  EGD) WITH PROPOFOL N/A 04/04/2017   Performed by [name], MD at [facility]  . EXPLORATORY LAPAROTOMY N/A 04/15/2017   Performed by Clayburn Pert, MD at [facility] ORS  . HIP FRACTURE SURGERY Left 01/25/2012  . INSERTION PORT-A-CATH Right 05/21/2017   Performed by Clayburn Pert, MD at [facility]  ORS  . NECK SURGERY  12/2011  . WRIST FRACTURE SURGERY      SOCIAL HISTORY: Social History   Socioeconomic History  . Marital status: Divorced    Spouse name: Not on file  . Number of children: Not on file  . Years of education: Not on file  . Highest education level: Not on file  Social Needs  . Financial resource strain: Not on file  . Food insecurity - worry: Not on file  . Food insecurity - inability: Not on file  . Transportation needs - medical: Not on file  . Transportation needs - non-medical: Not on file  Occupational History  . Not on file  Tobacco Use  . Smoking status: Former Smoker    Packs/day: 0.25    Years: 55.00    Pack years: 13.75    Types: Cigarettes    Last attempt to quit: 05/21/2017    Years since quitting: 0.2  . Smokeless tobacco: Never Used  . Tobacco comment: started age 18 1/3 to 1 ppd  Substance and Sexual Activity  . Alcohol use: Yes    Alcohol/week: 0.0 oz    Comment: occasionally drinks beer  . Drug use: No  . Sexual activity: No  Other Topics Concern  . Not on file  Social History Narrative  . Not on file    FAMILY HISTORY Family History  Problem Relation Age of Onset  . Diabetes Mother        type 2  . Coronary artery disease Mother   . Deep vein thrombosis Mother   . Colon cancer Father   . Asthma Brother   . Diabetes Brother        type 2    ALLERGIES:  is allergic to iodine; other; and sinus formula [cholestatin].  MEDICATIONS:  Current Outpatient Medications  Medication Sig Dispense Refill  . budesonide-formoterol (SYMBICORT) 80-4.5 MCG/ACT inhaler Inhale 2 puffs into the lungs 2 (two) times daily.    . Calcium Citrate-Vitamin D (CITRACAL/VITAMIN D PO) Take 1 tablet by mouth daily.     . DULoxetine (CYMBALTA) 30 MG capsule Take 1 capsule (30 mg total) by mouth as directed. 1 tablet daily x7 days then 2 tablets daily (Patient not taking: Reported on 08/05/2017) 53 capsule 0  . feeding supplement (BOOST / RESOURCE  BREEZE) LIQD Take 1 Container by mouth 3 (three) times daily between meals. (Patient taking differently: Take 1 Container by mouth 2 (two) times daily. ) 30 Container 0  . furosemide (LASIX) 20 MG tablet Take 1 tablet (20 mg total) by mouth daily. (Patient not taking: Reported on 08/05/2017) 90 tablet 4  . HYDROcodone-acetaminophen (NORCO) 7.5-325 MG tablet Take 1-2 tablets every 6 (six) hours as needed by mouth for moderate pain. 120 tablet 0  . ibuprofen (ADVIL,MOTRIN) 200 MG tablet Take 400 mg by mouth daily as needed for headache or moderate pain.    Marland Kitchen lidocaine-prilocaine (EMLA) cream Apply to affected area once 30 g 3  . LORazepam (ATIVAN) 0.5 MG tablet Take 1 tablet (0.5 mg total) by mouth every 6 (six) hours as needed (Nausea or vomiting). 30 tablet 0  . magnesium chloride (SLOW-MAG) 64 MG  TBEC SR tablet Take 1 tablet (64 mg total) by mouth daily. 30 tablet 3  . meloxicam (MOBIC) 15 MG tablet TAKE 1 TABLET EVERY DAY AS NEEDED 90 tablet 4  . omeprazole (PRILOSEC) 20 MG capsule TAKE 1 CAPSULE EVERY DAY 90 capsule 4  . ondansetron (ZOFRAN) 8 MG tablet Take 1 tablet (8 mg total) by mouth 2 (two) times daily as needed for refractory nausea / vomiting. Start on day 3 after chemotherapy. 30 tablet 1  . potassium chloride (K-DUR) 10 MEQ tablet Take 1 tablet (10 mEq total) by mouth daily. (Patient not taking: Reported on 07/22/2017) 30 tablet 0  . pravastatin (PRAVACHOL) 40 MG tablet TAKE 1 TABLET (40 MG TOTAL) BY MOUTH DAILY. 90 tablet 4  . Probiotic Product (PROBIOTIC DAILY PO) Take 1 capsule by mouth daily.     . prochlorperazine (COMPAZINE) 10 MG tablet Take 1 tablet (10 mg total) by mouth every 6 (six) hours as needed (Nausea or vomiting). 30 tablet 1  . pyridOXINE (VITAMIN B-6) 50 MG tablet Take 1 tablet (50 mg total) by mouth daily. 30 tablet 3  . simethicone (MYLICON) 80 MG chewable tablet Chew 2 tablets (160 mg total) by mouth every 6 (six) hours as needed for flatulence. 120 tablet 0  .  Triamcinolone Acetonide (NASACORT ALLERGY 24HR NA) Place 1 spray into the nose daily as needed (allergies).    . varenicline (CHANTIX CONTINUING MONTH PAK) 1 MG tablet Take 1 tablet (1 mg total) by mouth 2 (two) times daily. 60 tablet 2   No current facility-administered medications for this visit.    Facility-Administered Medications Ordered in Other Visits  Medication Dose Route Frequency Provider Last Rate Last Dose  . 0.9 %  sodium chloride infusion   Intravenous Continuous Earlie Server, MD   Stopped at 07/22/17 1150  . heparin lock flush 100 unit/mL  500 Units Intravenous Once Earlie Server, MD      . heparin lock flush 100 unit/mL  500 Units Intravenous Once Earlie Server, MD      . heparin lock flush 100 unit/mL  500 Units Intravenous Once Earlie Server, MD      . sodium chloride flush (NS) 0.9 % injection 10 mL  10 mL Intracatheter PRN Earlie Server, MD      . sodium chloride flush (NS) 0.9 % injection 10 mL  10 mL Intravenous PRN Earlie Server, MD   10 mL at 05/29/17 1347    PHYSICAL EXAMINATION:  ECOG PERFORMANCE STATUS: 0 - Asymptomatic  Vitals:   08/18/17 1003  BP: (!) 159/84  Pulse: 80  Temp: 97.7 F (36.5 C)   Filed Weights   08/18/17 1003  Weight: 155 lb 6 oz (70.5 kg)    Physical Exam  GENERAL: No distress, well nourished.  SKIN:  No rashes or significant lesions  HEAD: Normocephalic, No masses, lesions, tenderness or abnormalities  EYES: Conjunctiva are pink, non icteric ENT: External ears normal ,lips , buccal mucosa, and tongue normal and mucous membranes are moist  LYMPH: No palpable cervical and axillary lymphadenopathy  LUNGS: Clear to auscultation, no crackles or wheezes HEART: Regular rate & rhythm, no murmurs, no gallops, S1 normal and S2 normal  ABDOMEN: Abdomen soft, non-tender, normal bowel sounds, (+) colostomy bag she also has another stoma with drainage on the dressing. MUSCULOSKELETAL: No CVA tenderness and no tenderness on percussion of the back or rib cage.   EXTREMITIES: No edema, no skin discoloration or tenderness NEURO: Alert & oriented, no focal motor/sensory  deficits. Steady gait, no nystagmus.     LABORATORY DATA: I have personally reviewed the data as listed: CBC    Component Value Date/Time   WBC 8.0 08/05/2017 0855   RBC 4.07 08/05/2017 0855   HGB 13.3 08/05/2017 0855   HGB 12.2 01/02/2017 1410   HCT 39.8 08/05/2017 0855   HCT 37.3 01/02/2017 1410   PLT 266 08/05/2017 0855   PLT 486 (H) 01/02/2017 1410   MCV 97.9 08/05/2017 0855   MCV 86 01/02/2017 1410   MCV 96 01/29/2012 0428   MCH 32.8 08/05/2017 0855   MCHC 33.5 08/05/2017 0855   RDW 19.0 (H) 08/05/2017 0855   RDW 16.1 (H) 01/02/2017 1410   RDW 12.7 01/29/2012 0428   LYMPHSABS 1.7 08/05/2017 0855   LYMPHSABS 3.9 (H) 01/02/2017 1410   LYMPHSABS 1.9 01/29/2012 0428   MONOABS 1.3 (H) 08/05/2017 0855   MONOABS 2.0 (H) 01/29/2012 0428   EOSABS 0.2 08/05/2017 0855   EOSABS 0.4 01/02/2017 1410   EOSABS 0.1 01/29/2012 0428   BASOSABS 0.2 (H) 08/05/2017 0855   BASOSABS 0.1 01/02/2017 1410   BASOSABS 0.1 01/29/2012 0428   CMP Latest Ref Rng & Units 08/05/2017 07/22/2017 07/08/2017  Glucose 65 - 99 mg/dL 145(H) 112(H) 116(H)  BUN 6 - 20 mg/dL '9 8 12  ' Creatinine 0.44 - 1.00 mg/dL 0.74 0.60 0.73  Sodium 135 - 145 mmol/L 133(L) 136 136  Potassium 3.5 - 5.1 mmol/L 3.9 3.8 3.4(L)  Chloride 101 - 111 mmol/L 100(L) 105 107  CO2 22 - 32 mmol/L 25 23 21(L)  Calcium 8.9 - 10.3 mg/dL 8.8(L) 8.2(L) 8.9  Total Protein 6.5 - 8.1 g/dL 6.7 6.2(L) 6.1(L)  Total Bilirubin 0.3 - 1.2 mg/dL 0.4 0.4 0.3  Alkaline Phos 38 - 126 U/L 74 75 77  AST 15 - 41 U/L '25 21 26  ' ALT 14 - 54 U/L 13(L) 12(L) 17    RADIOGRAPHIC STUDIES: I have personally reviewed the radiological images as listed and agree with the findings in the report 04/08/2017 CT abdomen pelvis w contrast IMPRESSION: 1. Partial obstruction at the level of the transverse colon, likely secondary to carcinoma. 2. Ileocolic  mesenteric adenopathy, suspicious for nodal metastasis. 3.  Aortic Atherosclerosis (ICD10-I70.0). 4. Bilateral adrenal adenomas. 04/21/2017 CT abdomen pelvis with contrast. IMPRESSION: Postsurgical changes consistent with the given clinical history. A small amount of fluid in a year is noted surrounding the right lower quadrant ostomy likely felt to be postoperative in nature. No definitive abscess is seen. The patient's palpable abnormality likely corresponds to the small fluid collection.  PATHOLOGY DIAGNOSIS:  A. COLON MASS, TRANSVERSE; TRANSVERSE COLECTOMY:  - INVASIVE ADENOCARCINOMA, MODERATELY DIFFERENTIATED.  - THREE OF SIXTEEN LYMPH NODES INVOLVED BY METASTASIS (3/16).  - LYMPHOVASCULAR AND PERINEURAL INVASION PRESENT.  - SEE SUMMARY BELOW.  Surgical Pathology Cancer Case Summary  COLON AND RECTUM:  Procedure: transverse colectomy  Tumor Site:  transverse colon  Tumor Size: Greatest dimension: 2.3 cm  Macroscopic Tumor Perforation: not specified  Histologic Type: adenocarcinoma  Histologic Grade: G2, moderately differentiated  Tumor Extension: tumor invades the visceral peritoneum  Margins: all margins are uninvolved by invasive carcinoma, high-grade dysplasia, intramucosal adenocarcinoma, and adenoma  Treatment Effect: no known presurgical therapy  Lymphovascular Invasion: present  Perineural Invasion: present  Tumor Deposits: not identified  Regional Lymph Nodes: # examined: 16  # involved: 3  Pathologic Stage Classification (pTNM, AJCC 8th Edition): pT4a pN1b   ADDENDUM:   MICROSATELLITE INSTABILITY IMMUNOHISTOCHEMISTRY  MISMATCH REPAIR PROTEINS:  MLH1: Intact nuclear expression  MSH2: Intact nuclear expression  MSH6: Intact nuclear expression  PMS2: Intact nuclear expression   Interpretation: No loss of nuclear expression of mismatch repair  proteins: Low probability of MSI-H.   Addendum: Genetic test INVITAE came back negative. No pathogenetic sequence  variants or deletions/duplications identified. Results was scanned to [employer] (a copy of the report was provided to patient and her daughter)  ASSESSMENT/PLAN 71yo female who has MSI  stable stage IIIB Colon Cancer starting adjuvant 5-FU # baseline CEA is 2.1  Cancer Staging Malignant neoplasm of transverse colon [facility]) Staging form: Colon and Rectum, AJCC 8th Edition - Clinical stage from 04/15/2017: Stage IIIB (cT4a, cN1b, cM0) - Signed by Earlie Server, MD on 04/26/2017  1. Encounter for antineoplastic chemotherapy   2. Malignant neoplasm of transverse colon ([place])   3. Personal history of malignant neoplasm of breast    #Okay to proceed cycle 7 5-FU single agent. # T4 disease, referred to RadOnc when she is close to finish chemotherapy. I think she would benefit from adjuvant radiation. As she has T4 disease, after she finishes systemic chemotherapy, I think she would benefit from radiation to reduce local recurrence. Will refer   # Chronic hypomagnesia: resolved. Magnesium at 1.9. Stable Continue oral SLOW Mag 1 tablet daily.  # Grade 2 neuropathy: Pre-existing peripheral neuropathy, back to baseline after oxaliplatin was discontinued. Does not want to try Cymbalta. Continue vitamin B6..  # Gait instability, intermittent, benign neurological exam. Negative CT brain. Continue monitor clinically.  All questions were answered. The patient knows to call the clinic with any problems, questions or concerns.  Follow up in 2 weeks on day 1 of cycle 8  Dr. Earlie Server, MD, PhD [facility] at [facility] Pager- [phone] 08/18/2017     Dr.Charlottie Peragine Tasia Catchings

## 2017-08-19 ENCOUNTER — Ambulatory Visit: Payer: Medicare HMO

## 2017-08-19 ENCOUNTER — Other Ambulatory Visit: Payer: Medicare HMO

## 2017-08-19 ENCOUNTER — Ambulatory Visit: Payer: Medicare HMO | Admitting: Oncology

## 2017-08-20 ENCOUNTER — Inpatient Hospital Stay: Payer: Medicare HMO

## 2017-08-20 DIAGNOSIS — C184 Malignant neoplasm of transverse colon: Secondary | ICD-10-CM

## 2017-08-20 DIAGNOSIS — D3502 Benign neoplasm of left adrenal gland: Secondary | ICD-10-CM | POA: Diagnosis not present

## 2017-08-20 DIAGNOSIS — G629 Polyneuropathy, unspecified: Secondary | ICD-10-CM | POA: Diagnosis not present

## 2017-08-20 DIAGNOSIS — I700 Atherosclerosis of aorta: Secondary | ICD-10-CM | POA: Diagnosis not present

## 2017-08-20 DIAGNOSIS — D3501 Benign neoplasm of right adrenal gland: Secondary | ICD-10-CM | POA: Diagnosis not present

## 2017-08-20 DIAGNOSIS — R599 Enlarged lymph nodes, unspecified: Secondary | ICD-10-CM | POA: Diagnosis not present

## 2017-08-20 DIAGNOSIS — M25569 Pain in unspecified knee: Secondary | ICD-10-CM | POA: Diagnosis not present

## 2017-08-20 DIAGNOSIS — R2681 Unsteadiness on feet: Secondary | ICD-10-CM | POA: Diagnosis not present

## 2017-08-20 DIAGNOSIS — Z5111 Encounter for antineoplastic chemotherapy: Secondary | ICD-10-CM | POA: Diagnosis not present

## 2017-08-20 MED ORDER — SODIUM CHLORIDE 0.9% FLUSH
10.0000 mL | INTRAVENOUS | Status: DC | PRN
  Administered 2017-08-20: 10 mL
  Filled 2017-08-20: qty 10

## 2017-08-20 MED ORDER — HEPARIN SOD (PORK) LOCK FLUSH 100 UNIT/ML IV SOLN
500.0000 [IU] | Freq: Once | INTRAVENOUS | Status: AC | PRN
  Administered 2017-08-20: 500 [IU]

## 2017-08-25 ENCOUNTER — Other Ambulatory Visit: Payer: Self-pay | Admitting: *Deleted

## 2017-08-25 NOTE — Telephone Encounter (Signed)
   Ref Range & Units 7d ago  Potassium 3.5 - 5.1 mmol/L 3.9

## 2017-08-25 NOTE — Telephone Encounter (Signed)
She no longer needs to take potassium. Thanks.

## 2017-09-01 NOTE — Progress Notes (Signed)
[name] Follow up  Visit:  Patient Care Team: Birdie Sons, MD as PCP - General (Family Medicine)  PURPOSE OF VISIT: Adjuvant chemotherapy for colon cancer  HISTORY OF PRESENTING ILLNESS: Kimberly [name] 71 y.o. female with PMH listed as below presents for follow up for the management of Stage IIIB Colon Cancer Patient reports a remote history of breast cancer s/p lumpectomy and radiation treatments. Patient had been having abdominal pain, weight loss and bloating.  CT scan showed partial obstruction at the level of transverse colon and ileocolic mesenteric adenopathy. She was prepared for a colonoscopy on 04/15/2017. She started to have worsened abdominal pain, nausea vomiting, unable to maintain oral intake and was sent to ED. She was found to have bowel obstruction and underwent  exploratory laparotomy with transverse colectomy, with end colostomy and mucous fistula formation for obstructing colon lesion. Differential diagnosis prior to surgery was metastatic breast cancer in colon vs colon cancer. The patient did have a colonoscopy 2 years ago without the mass being present at that time but did have 2 small polyps in the transverse colon that were removed.  04/15/2017 Patient underwent transverse colon resection.  Pathology showed T4aN1 moderate differentiated adenocarcinoma, negative margin, 3/16 lymph nodes involved with cancer.  04/21/2017 She was seen in the ER earlier this week due to colostomy wound infection lateral to her right-sided end colostomy and to the midsection of her midline incision. She was started on antibiotics for cellulitis   TREATMENT: adjuvant FOLFOX, Oxaliplatin was discontinued after 3 cycles due to worsening of pre-existing neuropathy.   INTERVAL HISTORY Patient presents for evaluation prior to her cycle 8 5-FU.  Patient tolerated chemotherapy well except she mentioned having mouth sores a few days after last treatment. Mouth sore  spontaneously resolved. Her foul smelling stoma drainage resolved.  Denies shortness of breath, chest pain, abdominal pain, leg swelling.  Review of Systems  Constitutional: Negative for chills, diaphoresis and unexpected weight change.  HENT:   Positive for mouth sores. Negative for hearing loss and lump/mass.   Eyes: Negative for eye problems and icterus.  Respiratory: Negative for chest tightness and cough.   Cardiovascular: Negative for chest pain and leg swelling.  Gastrointestinal: Negative for abdominal distention and abdominal pain.       Stoma drainage and foul smelling.  Endocrine: Negative for hot flashes.  Genitourinary: Negative for difficulty urinating and dyspareunia.   Musculoskeletal: Negative for arthralgias, back pain and gait problem.  Skin: Negative for itching and rash.  Neurological: Negative for dizziness and gait problem.  Hematological: Negative for adenopathy. Does not bruise/bleed easily.  Psychiatric/Behavioral: Negative for confusion. The patient is not nervous/anxious.       MEDICAL HISTORY: Past Medical History:  Diagnosis Date  . Breast cancer ([place])   . Breast cancer, left ([facility])    Lumpectomy and rad tx's.  . Chronic back pain   . Chronic knee pain   . Colon cancer ([place])   . Degenerative disc disease, lumbar 04/2013  . Dyspnea   . Family history of adverse reaction to anesthesia    son arrested after anesthesia  . GERD (gastroesophageal reflux disease)   . Hyperlipidemia   . Iron deficiency anemia 05/27/2017  . Neuropathy   . Osteoarthritis    of knee  . Osteoporosis   . Tobacco abuse   . Vitamin D deficiency     SURGICAL HISTORY: Past Surgical History:  Procedure Laterality Date  . APPENDECTOMY  1965  .  BREAST EXCISIONAL BIOPSY Left 08/01/2003   lumpectomy rad 11/04-2/28/2005  . CESAREAN SECTION     x3  . COLON SURGERY    . COLONOSCOPY W/ POLYPECTOMY    . ESOPHAGOGASTRODUODENOSCOPY (EGD) WITH PROPOFOL N/A 04/04/2017   Procedure:  ESOPHAGOGASTRODUODENOSCOPY (EGD) WITH PROPOFOL;  Surgeon: [name], MD;  Location: [place];  Service: Endoscopy;  Laterality: N/A;  . HIP FRACTURE SURGERY Left 01/25/2012  . LAPAROTOMY N/A 04/15/2017   Procedure: EXPLORATORY LAPAROTOMY;  Surgeon: Clayburn Pert, MD;  Location: [facility];  Service: General;  Laterality: N/A;  . NECK SURGERY  12/2011  . PORTACATH PLACEMENT Right 05/21/2017   Procedure: INSERTION PORT-A-CATH;  Surgeon: Clayburn Pert, MD;  Location: [facility];  Service: General;  Laterality: Right;  . WRIST FRACTURE SURGERY      SOCIAL HISTORY: Social History   Socioeconomic History  . Marital status: Divorced    Spouse name: Not on file  . Number of children: Not on file  . Years of education: Not on file  . Highest education level: Not on file  Social Needs  . Financial resource strain: Not on file  . Food insecurity - worry: Not on file  . Food insecurity - inability: Not on file  . Transportation needs - medical: Not on file  . Transportation needs - non-medical: Not on file  Occupational History  . Not on file  Tobacco Use  . Smoking status: Former Smoker    Packs/day: 0.25    Years: 55.00    Pack years: 13.75    Types: Cigarettes    Last attempt to quit: 05/21/2017    Years since quitting: 0.2  . Smokeless tobacco: Never Used  . Tobacco comment: started age 64 1/3 to 1 ppd  Substance and Sexual Activity  . Alcohol use: Yes    Alcohol/week: 0.0 oz    Comment: occasionally drinks beer  . Drug use: No  . Sexual activity: No  Other Topics Concern  . Not on file  Social History Narrative  . Not on file    FAMILY HISTORY Family History  Problem Relation Age of Onset  . Diabetes Mother        type 2  . Coronary artery disease Mother   . Deep vein thrombosis Mother   . Colon cancer Father   . Asthma Brother   . Diabetes Brother        type 2    ALLERGIES:  is allergic to iodine; other; and sinus formula  [cholestatin].  MEDICATIONS:  Current Outpatient Medications  Medication Sig Dispense Refill  . budesonide-formoterol (SYMBICORT) 80-4.5 MCG/ACT inhaler Inhale 2 puffs into the lungs 2 (two) times daily.    . Calcium Citrate-Vitamin D (CITRACAL/VITAMIN D PO) Take 1 tablet by mouth daily.     . DULoxetine (CYMBALTA) 30 MG capsule Take 1 capsule (30 mg total) by mouth as directed. 1 tablet daily x7 days then 2 tablets daily (Patient not taking: Reported on 08/05/2017) 53 capsule 0  . feeding supplement (BOOST / RESOURCE BREEZE) LIQD Take 1 Container by mouth 3 (three) times daily between meals. (Patient taking differently: Take 1 Container by mouth 2 (two) times daily. ) 30 Container 0  . furosemide (LASIX) 20 MG tablet Take 1 tablet (20 mg total) by mouth daily. (Patient not taking: Reported on 08/05/2017) 90 tablet 4  . HYDROcodone-acetaminophen (NORCO) 7.5-325 MG tablet Take 1-2 tablets every 6 (six) hours as needed by mouth for moderate pain. 120 tablet 0  . ibuprofen (  ADVIL,MOTRIN) 200 MG tablet Take 400 mg by mouth daily as needed for headache or moderate pain.    Marland Kitchen lidocaine-prilocaine (EMLA) cream Apply to affected area once 30 g 3  . LORazepam (ATIVAN) 0.5 MG tablet Take 1 tablet (0.5 mg total) by mouth every 6 (six) hours as needed (Nausea or vomiting). 30 tablet 0  . magnesium chloride (SLOW-MAG) 64 MG TBEC SR tablet Take 1 tablet (64 mg total) by mouth daily. 30 tablet 3  . meloxicam (MOBIC) 15 MG tablet TAKE 1 TABLET EVERY DAY AS NEEDED 90 tablet 4  . omeprazole (PRILOSEC) 20 MG capsule TAKE 1 CAPSULE EVERY DAY 90 capsule 4  . ondansetron (ZOFRAN) 8 MG tablet Take 1 tablet (8 mg total) by mouth 2 (two) times daily as needed for refractory nausea / vomiting. Start on day 3 after chemotherapy. 30 tablet 1  . potassium chloride (K-DUR) 10 MEQ tablet Take 1 tablet (10 mEq total) by mouth daily. (Patient not taking: Reported on 07/22/2017) 30 tablet 0  . pravastatin (PRAVACHOL) 40 MG tablet  TAKE 1 TABLET (40 MG TOTAL) BY MOUTH DAILY. 90 tablet 4  . Probiotic Product (PROBIOTIC DAILY PO) Take 1 capsule by mouth daily.     . prochlorperazine (COMPAZINE) 10 MG tablet Take 1 tablet (10 mg total) by mouth every 6 (six) hours as needed (Nausea or vomiting). 30 tablet 1  . pyridOXINE (VITAMIN B-6) 50 MG tablet Take 1 tablet (50 mg total) by mouth daily. 30 tablet 3  . simethicone (MYLICON) 80 MG chewable tablet Chew 2 tablets (160 mg total) by mouth every 6 (six) hours as needed for flatulence. 120 tablet 0  . Triamcinolone Acetonide (NASACORT ALLERGY 24HR NA) Place 1 spray into the nose daily as needed (allergies).    . varenicline (CHANTIX CONTINUING MONTH PAK) 1 MG tablet Take 1 tablet (1 mg total) by mouth 2 (two) times daily. 60 tablet 2   No current facility-administered medications for this visit.    Facility-Administered Medications Ordered in Other Visits  Medication Dose Route Frequency Provider Last Rate Last Dose  . 0.9 %  sodium chloride infusion   Intravenous Continuous Earlie Server, MD   Stopped at 07/22/17 1150  . heparin lock flush 100 unit/mL  500 Units Intravenous Once Earlie Server, MD      . heparin lock flush 100 unit/mL  500 Units Intravenous Once Earlie Server, MD      . heparin lock flush 100 unit/mL  500 Units Intravenous Once Earlie Server, MD      . sodium chloride flush (NS) 0.9 % injection 10 mL  10 mL Intracatheter PRN Earlie Server, MD      . sodium chloride flush (NS) 0.9 % injection 10 mL  10 mL Intravenous PRN Earlie Server, MD   10 mL at 05/29/17 1347    PHYSICAL EXAMINATION:  ECOG PERFORMANCE STATUS: 0 - Asymptomatic  Vitals:   09/02/17 1205  BP: (!) 151/84  Pulse: 77  Temp: 98.5 F (36.9 C)   Filed Weights   09/02/17 1205  Weight: 154 lb 7 oz (70.1 kg)    Physical Exam  Constitutional: She is oriented to person, place, and time and well-developed, well-nourished, and in no distress. No distress.  HENT:  Head: Normocephalic and atraumatic.  Eyes: EOM are normal.  Pupils are equal, round, and reactive to light.  Neck: Normal range of motion. Neck supple.  Cardiovascular: Normal rate and regular rhythm.  Pulmonary/Chest: Effort normal and breath sounds normal.  Abdominal: Soft. Bowel sounds are normal.  (+)Colostomy  Musculoskeletal: Normal range of motion.  Lymphadenopathy:    She has no cervical adenopathy.  Neurological: She is alert and oriented to person, place, and time. Gait normal.  Skin: Skin is warm and dry.  Psychiatric: Affect normal.      LABORATORY DATA: I have personally reviewed the data as listed: CBC    Component Value Date/Time   WBC 10.1 08/18/2017 0918   RBC 3.93 08/18/2017 0918   HGB 13.5 08/18/2017 0918   HGB 12.2 01/02/2017 1410   HCT 38.4 08/18/2017 0918   HCT 37.3 01/02/2017 1410   PLT 256 08/18/2017 0918   PLT 486 (H) 01/02/2017 1410   MCV 97.8 08/18/2017 0918   MCV 86 01/02/2017 1410   MCV 96 01/29/2012 0428   MCH 34.3 (H) 08/18/2017 0918   MCHC 35.1 08/18/2017 0918   RDW 18.7 (H) 08/18/2017 0918   RDW 16.1 (H) 01/02/2017 1410   RDW 12.7 01/29/2012 0428   LYMPHSABS 2.5 08/18/2017 0918   LYMPHSABS 3.9 (H) 01/02/2017 1410   LYMPHSABS 1.9 01/29/2012 0428   MONOABS 1.7 (H) 08/18/2017 0918   MONOABS 2.0 (H) 01/29/2012 0428   EOSABS 0.3 08/18/2017 0918   EOSABS 0.4 01/02/2017 1410   EOSABS 0.1 01/29/2012 0428   BASOSABS 0.1 08/18/2017 0918   BASOSABS 0.1 01/02/2017 1410   BASOSABS 0.1 01/29/2012 0428   CMP Latest Ref Rng & Units 09/02/2017 08/18/2017 08/05/2017  Glucose 65 - 99 mg/dL 94 118(H) 145(H)  BUN 6 - 20 mg/dL _0 Creatinine 0.44 - 1.00 mg/dL 0.70 0.77 0.74  Sodium 135 - 145 mmol/L 133(L) 134(L) 133(L)  Potassium 3.5 - 5.1 mmol/L 3.8 3.9 3.9  Chloride 101 - 111 mmol/L 99(L) 100(L) 100(L)  CO2 22 - 32 mmol/L _1 Calcium 8.9 - 10.3 mg/dL 8.8(L) 8.6(L) 8.8(L)  Total Protein 6.5 - 8.1 g/dL 6.9 6.6 6.7  Total Bilirubin 0.3 - 1.2 mg/dL 0.4 0.3 0.4  Alkaline Phos 38 - 126 U/L 64 71 74   AST 15 - 41 U/L _2 ALT 14 - 54 U/L 13(L) 12(L) 13(L)    RADIOGRAPHIC STUDIES: I have personally reviewed the radiological images as listed and agree with the findings in the report 04/08/2017 CT abdomen pelvis w contrast IMPRESSION: 1. Partial obstruction at the level of the transverse colon, likely secondary to carcinoma. 2. Ileocolic mesenteric adenopathy, suspicious for nodal metastasis. 3.  Aortic Atherosclerosis (ICD10-I70.0). 4. Bilateral adrenal adenomas. 04/21/2017 CT abdomen pelvis with contrast. IMPRESSION: Postsurgical changes consistent with the given clinical history. A small amount of fluid in a year is noted surrounding the right lower quadrant ostomy likely felt to be postoperative in nature. No definitive abscess is seen. The patient's palpable abnormality likely corresponds to the small fluid collection.  PATHOLOGY DIAGNOSIS:  A. COLON MASS, TRANSVERSE; TRANSVERSE COLECTOMY:  - INVASIVE ADENOCARCINOMA, MODERATELY DIFFERENTIATED.  - THREE OF SIXTEEN LYMPH NODES INVOLVED BY METASTASIS (3/16).  - LYMPHOVASCULAR AND PERINEURAL INVASION PRESENT.  - SEE SUMMARY BELOW.  Surgical Pathology Cancer Case Summary  COLON AND RECTUM:  Procedure: transverse colectomy  Tumor Site:  transverse colon  Tumor Size: Greatest dimension: 2.3 cm  Macroscopic Tumor Perforation: not specified  Histologic Type: adenocarcinoma  Histologic Grade: G2, moderately differentiated  Tumor Extension: tumor invades the visceral peritoneum  Margins: all margins are uninvolved by invasive carcinoma, high-grade dysplasia, intramucosal adenocarcinoma, and adenoma  Treatment Effect: no known presurgical therapy  Lymphovascular  Invasion: present  Perineural Invasion: present  Tumor Deposits: not identified  Regional Lymph Nodes: # examined: 16  # involved: 3  Pathologic Stage Classification (pTNM, AJCC 8th Edition): pT4a pN1b   ADDENDUM:   MICROSATELLITE INSTABILITY  IMMUNOHISTOCHEMISTRY  MISMATCH REPAIR PROTEINS:   MLH1: Intact nuclear expression  MSH2: Intact nuclear expression  MSH6: Intact nuclear expression  PMS2: Intact nuclear expression   Interpretation: No loss of nuclear expression of mismatch repair  proteins: Low probability of MSI-H.   Addendum: Genetic test INVITAE came back negative. No pathogenetic sequence variants or deletions/duplications identified. Results was scanned to [employer] (a copy of the report was provided to patient and her daughter)  ASSESSMENT/PLAN 71yo female who has MSI  stable stage IIIB Colon Cancer starting adjuvant 5-FU # baseline CEA is 2.1  Cancer Staging Malignant neoplasm of transverse colon [facility]) Staging form: Colon and Rectum, AJCC 8th Edition - Clinical stage from 04/15/2017: Stage IIIB (cT4a, cN1b, cM0) - Signed by Earlie Server, MD on 04/26/2017  1. Malignant neoplasm of transverse colon ([place])   2. Encounter for antineoplastic chemotherapy   3. Mucositis    #Okay to proceed cycle 8 5-FU  # T4 disease, will refer to Rad on after completion of chemo.  # Chronic hypomagnesia: resolved. Magnesium at 1.8. Stable Continue oral SLOW Mag 1 tablet daily.  # Grade 2 neuropathy: Pre-existing peripheral neuropathy, stable. Continue vitamin B6..  # mucositis: perdex oral solution swish and swallow.:   All questions were answered. The patient knows to call the clinic with any problems, questions or concerns.  Follow up in 2 weeks on day 1 of cycle 9  Dr. Earlie Server, MD, PhD [facility] Pager- [phone] 09/01/2017     Dr.Tresean Mattix Tasia Catchings

## 2017-09-02 ENCOUNTER — Inpatient Hospital Stay: Payer: Medicare HMO | Attending: Oncology | Admitting: Oncology

## 2017-09-02 ENCOUNTER — Inpatient Hospital Stay: Payer: Medicare HMO

## 2017-09-02 ENCOUNTER — Encounter: Payer: Self-pay | Admitting: Oncology

## 2017-09-02 ENCOUNTER — Other Ambulatory Visit: Payer: Self-pay

## 2017-09-02 VITALS — BP 151/84 | HR 77 | Temp 98.5°F | Wt 154.4 lb

## 2017-09-02 DIAGNOSIS — Z79899 Other long term (current) drug therapy: Secondary | ICD-10-CM

## 2017-09-02 DIAGNOSIS — D509 Iron deficiency anemia, unspecified: Secondary | ICD-10-CM | POA: Diagnosis not present

## 2017-09-02 DIAGNOSIS — D3502 Benign neoplasm of left adrenal gland: Secondary | ICD-10-CM

## 2017-09-02 DIAGNOSIS — K219 Gastro-esophageal reflux disease without esophagitis: Secondary | ICD-10-CM | POA: Diagnosis not present

## 2017-09-02 DIAGNOSIS — Z933 Colostomy status: Secondary | ICD-10-CM | POA: Diagnosis not present

## 2017-09-02 DIAGNOSIS — M5136 Other intervertebral disc degeneration, lumbar region: Secondary | ICD-10-CM

## 2017-09-02 DIAGNOSIS — M25569 Pain in unspecified knee: Secondary | ICD-10-CM | POA: Diagnosis not present

## 2017-09-02 DIAGNOSIS — Z5111 Encounter for antineoplastic chemotherapy: Secondary | ICD-10-CM | POA: Insufficient documentation

## 2017-09-02 DIAGNOSIS — C184 Malignant neoplasm of transverse colon: Secondary | ICD-10-CM | POA: Diagnosis not present

## 2017-09-02 DIAGNOSIS — Z86010 Personal history of colonic polyps: Secondary | ICD-10-CM | POA: Diagnosis not present

## 2017-09-02 DIAGNOSIS — M1990 Unspecified osteoarthritis, unspecified site: Secondary | ICD-10-CM | POA: Diagnosis not present

## 2017-09-02 DIAGNOSIS — E559 Vitamin D deficiency, unspecified: Secondary | ICD-10-CM

## 2017-09-02 DIAGNOSIS — G8929 Other chronic pain: Secondary | ICD-10-CM | POA: Diagnosis not present

## 2017-09-02 DIAGNOSIS — Z853 Personal history of malignant neoplasm of breast: Secondary | ICD-10-CM | POA: Diagnosis not present

## 2017-09-02 DIAGNOSIS — E785 Hyperlipidemia, unspecified: Secondary | ICD-10-CM | POA: Diagnosis not present

## 2017-09-02 DIAGNOSIS — Z87891 Personal history of nicotine dependence: Secondary | ICD-10-CM

## 2017-09-02 DIAGNOSIS — F17210 Nicotine dependence, cigarettes, uncomplicated: Secondary | ICD-10-CM

## 2017-09-02 DIAGNOSIS — I700 Atherosclerosis of aorta: Secondary | ICD-10-CM

## 2017-09-02 DIAGNOSIS — D3501 Benign neoplasm of right adrenal gland: Secondary | ICD-10-CM

## 2017-09-02 DIAGNOSIS — C779 Secondary and unspecified malignant neoplasm of lymph node, unspecified: Secondary | ICD-10-CM

## 2017-09-02 DIAGNOSIS — G629 Polyneuropathy, unspecified: Secondary | ICD-10-CM

## 2017-09-02 DIAGNOSIS — K1230 Oral mucositis (ulcerative), unspecified: Secondary | ICD-10-CM | POA: Diagnosis not present

## 2017-09-02 DIAGNOSIS — Z923 Personal history of irradiation: Secondary | ICD-10-CM

## 2017-09-02 DIAGNOSIS — M549 Dorsalgia, unspecified: Secondary | ICD-10-CM | POA: Diagnosis not present

## 2017-09-02 DIAGNOSIS — Z800 Family history of malignant neoplasm of digestive organs: Secondary | ICD-10-CM

## 2017-09-02 DIAGNOSIS — R0602 Shortness of breath: Secondary | ICD-10-CM | POA: Diagnosis not present

## 2017-09-02 LAB — CBC WITH DIFFERENTIAL/PLATELET
BASOS PCT: 1 %
Basophils Absolute: 0.1 10*3/uL (ref 0–0.1)
EOS ABS: 0.2 10*3/uL (ref 0–0.7)
Eosinophils Relative: 2 %
HCT: 40.4 % (ref 35.0–47.0)
HEMOGLOBIN: 13.7 g/dL (ref 12.0–16.0)
Lymphocytes Relative: 21 %
Lymphs Abs: 2.1 10*3/uL (ref 1.0–3.6)
MCH: 33.3 pg (ref 26.0–34.0)
MCHC: 33.9 g/dL (ref 32.0–36.0)
MCV: 98.2 fL (ref 80.0–100.0)
MONOS PCT: 14 %
Monocytes Absolute: 1.4 10*3/uL — ABNORMAL HIGH (ref 0.2–0.9)
NEUTROS PCT: 62 %
Neutro Abs: 6.1 10*3/uL (ref 1.4–6.5)
Platelets: 259 10*3/uL (ref 150–440)
RBC: 4.12 MIL/uL (ref 3.80–5.20)
RDW: 17.8 % — AB (ref 11.5–14.5)
WBC: 9.8 10*3/uL (ref 3.6–11.0)

## 2017-09-02 LAB — COMPREHENSIVE METABOLIC PANEL
ALK PHOS: 64 U/L (ref 38–126)
ALT: 13 U/L — AB (ref 14–54)
AST: 21 U/L (ref 15–41)
Albumin: 3.2 g/dL — ABNORMAL LOW (ref 3.5–5.0)
Anion gap: 7 (ref 5–15)
BILIRUBIN TOTAL: 0.4 mg/dL (ref 0.3–1.2)
BUN: 9 mg/dL (ref 6–20)
CALCIUM: 8.8 mg/dL — AB (ref 8.9–10.3)
CO2: 27 mmol/L (ref 22–32)
CREATININE: 0.7 mg/dL (ref 0.44–1.00)
Chloride: 99 mmol/L — ABNORMAL LOW (ref 101–111)
Glucose, Bld: 94 mg/dL (ref 65–99)
Potassium: 3.8 mmol/L (ref 3.5–5.1)
SODIUM: 133 mmol/L — AB (ref 135–145)
Total Protein: 6.9 g/dL (ref 6.5–8.1)

## 2017-09-02 LAB — MAGNESIUM: MAGNESIUM: 1.8 mg/dL (ref 1.7–2.4)

## 2017-09-02 MED ORDER — CHLORHEXIDINE GLUCONATE 0.12 % MT SOLN: 15.0000 mL | Freq: Two times a day (BID) | OROMUCOSAL | 0 refills | Status: DC

## 2017-09-02 MED ORDER — DEXTROSE 5 % IV SOLN
Freq: Once | INTRAVENOUS | Status: AC
  Administered 2017-09-02: 13:00:00 via INTRAVENOUS
  Filled 2017-09-02: qty 1000

## 2017-09-02 MED ORDER — FLUOROURACIL CHEMO INJECTION 2.5 GM/50ML
400.0000 mg/m2 | Freq: Once | INTRAVENOUS | Status: AC
  Administered 2017-09-02: 700 mg via INTRAVENOUS
  Filled 2017-09-02: qty 14

## 2017-09-02 MED ORDER — DEXTROSE 5 % IV SOLN: 400.0000 mg/m2 | Freq: Once | INTRAVENOUS | Status: DC

## 2017-09-02 MED ORDER — LEUCOVORIN CALCIUM INJECTION 100 MG
20.0000 mg/m2 | Freq: Once | INTRAMUSCULAR | Status: AC
  Administered 2017-09-02: 34 mg via INTRAVENOUS
  Filled 2017-09-02: qty 1.7

## 2017-09-02 MED ORDER — FLUOROURACIL CHEMO INJECTION 5 GM/100ML
2400.0000 mg/m2 | INTRAVENOUS | Status: DC
  Administered 2017-09-02: 4200 mg via INTRAVENOUS
  Filled 2017-09-02: qty 84

## 2017-09-02 MED ORDER — PALONOSETRON HCL INJECTION 0.25 MG/5ML
0.2500 mg | Freq: Once | INTRAVENOUS | Status: AC
  Administered 2017-09-02: 0.25 mg via INTRAVENOUS

## 2017-09-02 NOTE — Progress Notes (Signed)
Patient here today for follow up.  Patient c/o blisters in her mouth after last treatment.

## 2017-09-04 ENCOUNTER — Inpatient Hospital Stay: Payer: Medicare HMO

## 2017-09-04 DIAGNOSIS — C779 Secondary and unspecified malignant neoplasm of lymph node, unspecified: Secondary | ICD-10-CM | POA: Diagnosis not present

## 2017-09-04 DIAGNOSIS — C184 Malignant neoplasm of transverse colon: Secondary | ICD-10-CM | POA: Diagnosis not present

## 2017-09-04 DIAGNOSIS — K1230 Oral mucositis (ulcerative), unspecified: Secondary | ICD-10-CM | POA: Diagnosis not present

## 2017-09-04 DIAGNOSIS — M25569 Pain in unspecified knee: Secondary | ICD-10-CM | POA: Diagnosis not present

## 2017-09-04 DIAGNOSIS — G629 Polyneuropathy, unspecified: Secondary | ICD-10-CM | POA: Diagnosis not present

## 2017-09-04 DIAGNOSIS — M549 Dorsalgia, unspecified: Secondary | ICD-10-CM | POA: Diagnosis not present

## 2017-09-04 DIAGNOSIS — G8929 Other chronic pain: Secondary | ICD-10-CM | POA: Diagnosis not present

## 2017-09-04 DIAGNOSIS — Z5111 Encounter for antineoplastic chemotherapy: Secondary | ICD-10-CM | POA: Diagnosis not present

## 2017-09-04 MED ORDER — HEPARIN SOD (PORK) LOCK FLUSH 100 UNIT/ML IV SOLN
500.0000 [IU] | Freq: Once | INTRAVENOUS | Status: AC | PRN
  Administered 2017-09-04: 500 [IU]

## 2017-09-04 MED ORDER — SODIUM CHLORIDE 0.9% FLUSH
10.0000 mL | INTRAVENOUS | Status: DC | PRN
  Administered 2017-09-04: 10 mL
  Filled 2017-09-04: qty 10

## 2017-09-04 MED ORDER — HEPARIN SOD (PORK) LOCK FLUSH 100 UNIT/ML IV SOLN
INTRAVENOUS | Status: AC
  Filled 2017-09-04: qty 5

## 2017-09-08 ENCOUNTER — Other Ambulatory Visit: Payer: Self-pay | Admitting: Oncology

## 2017-09-09 ENCOUNTER — Other Ambulatory Visit: Payer: Self-pay | Admitting: Oncology

## 2017-09-12 ENCOUNTER — Other Ambulatory Visit: Payer: Self-pay | Admitting: Family Medicine

## 2017-09-12 DIAGNOSIS — M5136 Other intervertebral disc degeneration, lumbar region: Secondary | ICD-10-CM

## 2017-09-15 MED ORDER — HYDROCODONE-ACETAMINOPHEN 7.5-325 MG PO TABS: 1.0000 | ORAL_TABLET | Freq: Four times a day (QID) | ORAL | 0 refills | Status: DC | PRN

## 2017-09-15 NOTE — Progress Notes (Signed)
[facility] Follow up  Visit:  Patient Care Team: Birdie Sons, MD as PCP - General (Family Medicine)  PURPOSE OF VISIT: Adjuvant chemotherapy for colon cancer  HISTORY OF PRESENTING ILLNESS: Kimberly [name] 71 y.o. female with PMH listed as below presents for follow up for the management of Stage IIIB Colon Cancer Patient reports a remote history of breast cancer s/p lumpectomy and radiation treatments. Patient had been having abdominal pain, weight loss and bloating.  CT scan showed partial obstruction at the level of transverse colon and ileocolic mesenteric adenopathy. She was prepared for a colonoscopy on 04/15/2017. She started to have worsened abdominal pain, nausea vomiting, unable to maintain oral intake and was sent to ED. She was found to have bowel obstruction and underwent  exploratory laparotomy with transverse colectomy, with end colostomy and mucous fistula formation for obstructing colon lesion. Differential diagnosis prior to surgery was metastatic breast cancer in colon vs colon cancer. The patient did have a colonoscopy 2 years ago without the mass being present at that time but did have 2 small polyps in the transverse colon that were removed.  04/15/2017 Patient underwent transverse colon resection.  Pathology showed T4aN1 moderate differentiated adenocarcinoma, negative margin, 3/16 lymph nodes involved with cancer.  04/21/2017 She was seen in the ER earlier this week due to colostomy wound infection lateral to her right-sided end colostomy and to the midsection of her midline incision. She was started on antibiotics for cellulitis   TREATMENT: adjuvant FOLFOX, Oxaliplatin was discontinued after 3 cycles due to worsening of pre-existing neuropathy.   INTERVAL HISTORY Patient presents for evaluation prior to her cycle 9 5-FU adjuvant chemotherapy.  Patient tolerated chemotherapy well. Her energy levels are okay. She reports 1 episode of feeling dizziness  when she stands up, resolved spontaneously. . Denies shortness of breath, chest pain, abdominal pain, leg swelling   Review of Systems  Constitutional: Negative for chills, diaphoresis, fatigue and unexpected weight change.  HENT:   Negative for hearing loss, lump/mass and mouth sores.   Eyes: Negative for eye problems and icterus.  Respiratory: Negative for chest tightness, cough and hemoptysis.   Cardiovascular: Negative for chest pain, leg swelling and palpitations.  Gastrointestinal: Negative for abdominal distention, abdominal pain and blood in stool.       Stoma drainage and foul smelling.  Endocrine: Negative for hot flashes.  Genitourinary: Negative for difficulty urinating, dyspareunia and dysuria.   Musculoskeletal: Negative for arthralgias, back pain, flank pain and gait problem.  Skin: Negative for itching and rash.  Neurological: Negative for dizziness, gait problem and headaches.  Hematological: Negative for adenopathy. Does not bruise/bleed easily.  Psychiatric/Behavioral: Negative for confusion and decreased concentration. The patient is not nervous/anxious.       MEDICAL HISTORY: Past Medical History:  Diagnosis Date  . Breast cancer ([place])   . Breast cancer, left ([facility])    Lumpectomy and rad tx's.  . Chronic back pain   . Chronic knee pain   . Colon cancer ([place])   . Degenerative disc disease, lumbar 04/2013  . Dyspnea   . Family history of adverse reaction to anesthesia    son arrested after anesthesia  . GERD (gastroesophageal reflux disease)   . Hyperlipidemia   . Iron deficiency anemia 05/27/2017  . Neuropathy   . Osteoarthritis    of knee  . Osteoporosis   . Tobacco abuse   . Vitamin D deficiency     SURGICAL HISTORY: Past Surgical History:  Procedure Laterality Date  . APPENDECTOMY  1965  . BREAST EXCISIONAL BIOPSY Left 08/01/2003   lumpectomy rad 11/04-2/28/2005  . CESAREAN SECTION     x3  . COLON SURGERY    . COLONOSCOPY W/ POLYPECTOMY    .  ESOPHAGOGASTRODUODENOSCOPY (EGD) WITH PROPOFOL N/A 04/04/2017   Procedure: ESOPHAGOGASTRODUODENOSCOPY (EGD) WITH PROPOFOL;  Surgeon: [name], MD;  Location: [facility];  Service: Endoscopy;  Laterality: N/A;  . HIP FRACTURE SURGERY Left 01/25/2012  . LAPAROTOMY N/A 04/15/2017   Procedure: EXPLORATORY LAPAROTOMY;  Surgeon: Clayburn Pert, MD;  Location: [facility];  Service: General;  Laterality: N/A;  . NECK SURGERY  12/2011  . PORTACATH PLACEMENT Right 05/21/2017   Procedure: INSERTION PORT-A-CATH;  Surgeon: Clayburn Pert, MD;  Location: [facility];  Service: General;  Laterality: Right;  . WRIST FRACTURE SURGERY      SOCIAL HISTORY: Social History   Socioeconomic History  . Marital status: Divorced    Spouse name: Not on file  . Number of children: Not on file  . Years of education: Not on file  . Highest education level: Not on file  Social Needs  . Financial resource strain: Not on file  . Food insecurity - worry: Not on file  . Food insecurity - inability: Not on file  . Transportation needs - medical: Not on file  . Transportation needs - non-medical: Not on file  Occupational History  . Not on file  Tobacco Use  . Smoking status: Former Smoker    Packs/day: 0.25    Years: 55.00    Pack years: 13.75    Types: Cigarettes    Last attempt to quit: 05/21/2017    Years since quitting: 0.3  . Smokeless tobacco: Never Used  . Tobacco comment: started age 65 1/3 to 1 ppd  Substance and Sexual Activity  . Alcohol use: Yes    Alcohol/week: 0.0 oz    Comment: occasionally drinks beer  . Drug use: No  . Sexual activity: No  Other Topics Concern  . Not on file  Social History Narrative  . Not on file    FAMILY HISTORY Family History  Problem Relation Age of Onset  . Diabetes Mother        type 2  . Coronary artery disease Mother   . Deep vein thrombosis Mother   . Colon cancer Father   . Asthma Brother   . Diabetes Brother        type 2    ALLERGIES:   is allergic to iodine; other; and sinus formula [cholestatin].  MEDICATIONS:  Current Outpatient Medications  Medication Sig Dispense Refill  . budesonide-formoterol (SYMBICORT) 80-4.5 MCG/ACT inhaler Inhale 2 puffs into the lungs 2 (two) times daily.    . Calcium Citrate-Vitamin D (CITRACAL/VITAMIN D PO) Take 1 tablet by mouth daily.     . chlorhexidine (PERIDEX) 0.12 % solution Use as directed 15 mLs in the mouth or throat 2 (two) times daily. Swish and spit 473 mL 0  . feeding supplement (BOOST / RESOURCE BREEZE) LIQD Take 1 Container by mouth 3 (three) times daily between meals. (Patient taking differently: Take 1 Container by mouth 2 (two) times daily. ) 30 Container 0  . furosemide (LASIX) 20 MG tablet Take 1 tablet (20 mg total) by mouth daily. 90 tablet 4  . HYDROcodone-acetaminophen (NORCO) 7.5-325 MG tablet Take 1-2 tablets every 6 (six) hours as needed by mouth for moderate pain. 120 tablet 0  . ibuprofen (ADVIL,MOTRIN) 200 MG tablet  Take 400 mg by mouth daily as needed for headache or moderate pain.    Marland Kitchen lidocaine-prilocaine (EMLA) cream Apply to affected area once 30 g 3  . LORazepam (ATIVAN) 0.5 MG tablet Take 1 tablet (0.5 mg total) by mouth every 6 (six) hours as needed (Nausea or vomiting). 30 tablet 0  . magnesium chloride (SLOW-MAG) 64 MG TBEC SR tablet Take 1 tablet (64 mg total) by mouth daily. 30 tablet 3  . meloxicam (MOBIC) 15 MG tablet TAKE 1 TABLET EVERY DAY AS NEEDED 90 tablet 4  . omeprazole (PRILOSEC) 20 MG capsule TAKE 1 CAPSULE EVERY DAY 90 capsule 4  . ondansetron (ZOFRAN) 8 MG tablet Take 1 tablet (8 mg total) by mouth 2 (two) times daily as needed for refractory nausea / vomiting. Start on day 3 after chemotherapy. 30 tablet 1  . potassium chloride (K-DUR) 10 MEQ tablet Take 1 tablet (10 mEq total) by mouth daily. 30 tablet 0  . pravastatin (PRAVACHOL) 40 MG tablet TAKE 1 TABLET (40 MG TOTAL) BY MOUTH DAILY. 90 tablet 4  . Probiotic Product (PROBIOTIC DAILY PO)  Take 1 capsule by mouth daily.     . prochlorperazine (COMPAZINE) 10 MG tablet Take 1 tablet (10 mg total) by mouth every 6 (six) hours as needed (Nausea or vomiting). 30 tablet 1  . pyridOXINE (VITAMIN B-6) 50 MG tablet Take 1 tablet (50 mg total) by mouth daily. 30 tablet 3  . simethicone (MYLICON) 80 MG chewable tablet Chew 2 tablets (160 mg total) by mouth every 6 (six) hours as needed for flatulence. 120 tablet 0  . Triamcinolone Acetonide (NASACORT ALLERGY 24HR NA) Place 1 spray into the nose daily as needed (allergies).     No current facility-administered medications for this visit.    Facility-Administered Medications Ordered in Other Visits  Medication Dose Route Frequency Provider Last Rate Last Dose  . 0.9 %  sodium chloride infusion   Intravenous Continuous Earlie Server, MD   Stopped at 07/22/17 1150  . heparin lock flush 100 unit/mL  500 Units Intravenous Once Earlie Server, MD      . heparin lock flush 100 unit/mL  500 Units Intravenous Once Earlie Server, MD      . heparin lock flush 100 unit/mL  500 Units Intravenous Once Earlie Server, MD      . sodium chloride flush (NS) 0.9 % injection 10 mL  10 mL Intracatheter PRN Earlie Server, MD      . sodium chloride flush (NS) 0.9 % injection 10 mL  10 mL Intravenous PRN Earlie Server, MD   10 mL at 05/29/17 1347    PHYSICAL EXAMINATION:  ECOG PERFORMANCE STATUS: 0 - Asymptomatic  There were no vitals filed for this visit. There were no vitals filed for this visit.  Physical Exam  Constitutional: She is oriented to person, place, and time and well-developed, well-nourished, and in no distress. No distress.  HENT:  Head: Normocephalic and atraumatic.  Mouth/Throat: No oropharyngeal exudate.  Eyes: Conjunctivae and EOM are normal. Pupils are equal, round, and reactive to light.  Neck: Normal range of motion. Neck supple. No JVD present.  Cardiovascular: Normal rate and regular rhythm.  No murmur heard. Pulmonary/Chest: Effort normal and breath sounds  normal. No respiratory distress.  Abdominal: Soft. Bowel sounds are normal. She exhibits no distension.  (+)Colostomy  Musculoskeletal: Normal range of motion. She exhibits edema.  Lymphadenopathy:    She has no cervical adenopathy.  Neurological: She is alert and oriented to  person, place, and time. Gait normal.  Skin: Skin is warm and dry. No erythema.  Psychiatric: Affect and judgment normal.      LABORATORY DATA: I have personally reviewed the data as listed: CBC    Component Value Date/Time   WBC 9.8 09/02/2017 1058   RBC 4.12 09/02/2017 1058   HGB 13.7 09/02/2017 1058   HGB 12.2 01/02/2017 1410   HCT 40.4 09/02/2017 1058   HCT 37.3 01/02/2017 1410   PLT 259 09/02/2017 1058   PLT 486 (H) 01/02/2017 1410   MCV 98.2 09/02/2017 1058   MCV 86 01/02/2017 1410   MCV 96 01/29/2012 0428   MCH 33.3 09/02/2017 1058   MCHC 33.9 09/02/2017 1058   RDW 17.8 (H) 09/02/2017 1058   RDW 16.1 (H) 01/02/2017 1410   RDW 12.7 01/29/2012 0428   LYMPHSABS 2.1 09/02/2017 1058   LYMPHSABS 3.9 (H) 01/02/2017 1410   LYMPHSABS 1.9 01/29/2012 0428   MONOABS 1.4 (H) 09/02/2017 1058   MONOABS 2.0 (H) 01/29/2012 0428   EOSABS 0.2 09/02/2017 1058   EOSABS 0.4 01/02/2017 1410   EOSABS 0.1 01/29/2012 0428   BASOSABS 0.1 09/02/2017 1058   BASOSABS 0.1 01/02/2017 1410   BASOSABS 0.1 01/29/2012 0428   CMP Latest Ref Rng & Units 09/02/2017 08/18/2017 08/05/2017  Glucose 65 - 99 mg/dL 94 118(H) 145(H)  BUN 6 - 20 mg/dL '9 18 9  ' Creatinine 0.44 - 1.00 mg/dL 0.70 0.77 0.74  Sodium 135 - 145 mmol/L 133(L) 134(L) 133(L)  Potassium 3.5 - 5.1 mmol/L 3.8 3.9 3.9  Chloride 101 - 111 mmol/L 99(L) 100(L) 100(L)  CO2 22 - 32 mmol/L '27 31 25  ' Calcium 8.9 - 10.3 mg/dL 8.8(L) 8.6(L) 8.8(L)  Total Protein 6.5 - 8.1 g/dL 6.9 6.6 6.7  Total Bilirubin 0.3 - 1.2 mg/dL 0.4 0.3 0.4  Alkaline Phos 38 - 126 U/L 64 71 74  AST 15 - 41 U/L '21 20 25  ' ALT 14 - 54 U/L 13(L) 12(L) 13(L)    RADIOGRAPHIC STUDIES: I have  personally reviewed the radiological images as listed and agree with the findings in the report 04/08/2017 CT abdomen pelvis w contrast IMPRESSION: 1. Partial obstruction at the level of the transverse colon, likely secondary to carcinoma. 2. Ileocolic mesenteric adenopathy, suspicious for nodal metastasis. 3.  Aortic Atherosclerosis (ICD10-I70.0). 4. Bilateral adrenal adenomas. 04/21/2017 CT abdomen pelvis with contrast. IMPRESSION: Postsurgical changes consistent with the given clinical history. A small amount of fluid in a year is noted surrounding the right lower quadrant ostomy likely felt to be postoperative in nature. No definitive abscess is seen. The patient's palpable abnormality likely corresponds to the small fluid collection.  PATHOLOGY DIAGNOSIS:  A. COLON MASS, TRANSVERSE; TRANSVERSE COLECTOMY:  - INVASIVE ADENOCARCINOMA, MODERATELY DIFFERENTIATED.  - THREE OF SIXTEEN LYMPH NODES INVOLVED BY METASTASIS (3/16).  - LYMPHOVASCULAR AND PERINEURAL INVASION PRESENT.  - SEE SUMMARY BELOW.  Surgical Pathology Cancer Case Summary  COLON AND RECTUM:  Procedure: transverse colectomy  Tumor Site:  transverse colon  Tumor Size: Greatest dimension: 2.3 cm  Macroscopic Tumor Perforation: not specified  Histologic Type: adenocarcinoma  Histologic Grade: G2, moderately differentiated  Tumor Extension: tumor invades the visceral peritoneum  Margins: all margins are uninvolved by invasive carcinoma, high-grade dysplasia, intramucosal adenocarcinoma, and adenoma  Treatment Effect: no known presurgical therapy  Lymphovascular Invasion: present  Perineural Invasion: present  Tumor Deposits: not identified  Regional Lymph Nodes: # examined: 16  # involved: 3  Pathologic Stage Classification (pTNM, AJCC 8th  Edition): pT4a pN1b   ADDENDUM:   MICROSATELLITE INSTABILITY IMMUNOHISTOCHEMISTRY  MISMATCH REPAIR PROTEINS:   MLH1: Intact nuclear expression  MSH2: Intact nuclear  expression  MSH6: Intact nuclear expression  PMS2: Intact nuclear expression   Interpretation: No loss of nuclear expression of mismatch repair  proteins: Low probability of MSI-H.   Addendum: Genetic test INVITAE came back negative. No pathogenetic sequence variants or deletions/duplications identified. Results was scanned to [employer] (a copy of the report was provided to patient and her daughter)  ASSESSMENT/PLAN 71yo female who has MSI  stable stage IIIB Colon Cancer starting adjuvant 5-FU # baseline CEA is 2.1  Cancer Staging Malignant neoplasm of transverse colon [facility]) Staging form: Colon and Rectum, AJCC 8th Edition - Clinical stage from 04/15/2017: Stage IIIB (cT4a, cN1b, cM0) - Signed by Earlie Server, MD on 04/26/2017  1. Malignant neoplasm of transverse colon ([place])   2. Encounter for antineoplastic chemotherapy    #Okay to proceed cycle 9 5FU.  # T4 disease, will refer to Rad on after completion of chemo.  # Chronic hypomagnesia: resolved. Magnesium at 1.8. Stable  # Grade 2 neuropathy: Pre-existing peripheral neuropathy, stable. Continue vitamin B6..  # mucositis: imoroved.  # Dizziness, transient,etiology unclear. ?dehydration. Encourage good hydration and stands up slowly. Recent CT brain was negative.  All questions were answered. The patient knows to call the clinic with any problems, questions or concerns.  Follow up in 2 weeks on day 1 of cycle 10  Dr. Earlie Server, MD, PhD [facility] at [facility] Pager- [phone] 09/15/2017     Dr.Daxx Tiggs Tasia Catchings

## 2017-09-16 ENCOUNTER — Inpatient Hospital Stay (HOSPITAL_BASED_OUTPATIENT_CLINIC_OR_DEPARTMENT_OTHER): Payer: Medicare HMO | Admitting: Oncology

## 2017-09-16 ENCOUNTER — Inpatient Hospital Stay: Payer: Medicare HMO

## 2017-09-16 ENCOUNTER — Encounter: Payer: Self-pay | Admitting: Oncology

## 2017-09-16 ENCOUNTER — Other Ambulatory Visit: Payer: Self-pay

## 2017-09-16 VITALS — BP 153/84 | HR 74 | Temp 97.8°F | Resp 18 | Wt 157.4 lb

## 2017-09-16 DIAGNOSIS — R42 Dizziness and giddiness: Secondary | ICD-10-CM

## 2017-09-16 DIAGNOSIS — M549 Dorsalgia, unspecified: Secondary | ICD-10-CM | POA: Diagnosis not present

## 2017-09-16 DIAGNOSIS — K1230 Oral mucositis (ulcerative), unspecified: Secondary | ICD-10-CM

## 2017-09-16 DIAGNOSIS — M5136 Other intervertebral disc degeneration, lumbar region: Secondary | ICD-10-CM

## 2017-09-16 DIAGNOSIS — G629 Polyneuropathy, unspecified: Secondary | ICD-10-CM | POA: Diagnosis not present

## 2017-09-16 DIAGNOSIS — R0602 Shortness of breath: Secondary | ICD-10-CM | POA: Diagnosis not present

## 2017-09-16 DIAGNOSIS — Z923 Personal history of irradiation: Secondary | ICD-10-CM

## 2017-09-16 DIAGNOSIS — G8929 Other chronic pain: Secondary | ICD-10-CM

## 2017-09-16 DIAGNOSIS — Z933 Colostomy status: Secondary | ICD-10-CM

## 2017-09-16 DIAGNOSIS — D509 Iron deficiency anemia, unspecified: Secondary | ICD-10-CM

## 2017-09-16 DIAGNOSIS — Z800 Family history of malignant neoplasm of digestive organs: Secondary | ICD-10-CM

## 2017-09-16 DIAGNOSIS — D3501 Benign neoplasm of right adrenal gland: Secondary | ICD-10-CM

## 2017-09-16 DIAGNOSIS — Z87891 Personal history of nicotine dependence: Secondary | ICD-10-CM

## 2017-09-16 DIAGNOSIS — D3502 Benign neoplasm of left adrenal gland: Secondary | ICD-10-CM

## 2017-09-16 DIAGNOSIS — I700 Atherosclerosis of aorta: Secondary | ICD-10-CM

## 2017-09-16 DIAGNOSIS — M25569 Pain in unspecified knee: Secondary | ICD-10-CM | POA: Diagnosis not present

## 2017-09-16 DIAGNOSIS — C779 Secondary and unspecified malignant neoplasm of lymph node, unspecified: Secondary | ICD-10-CM

## 2017-09-16 DIAGNOSIS — C184 Malignant neoplasm of transverse colon: Secondary | ICD-10-CM

## 2017-09-16 DIAGNOSIS — E785 Hyperlipidemia, unspecified: Secondary | ICD-10-CM | POA: Diagnosis not present

## 2017-09-16 DIAGNOSIS — Z5111 Encounter for antineoplastic chemotherapy: Secondary | ICD-10-CM | POA: Diagnosis not present

## 2017-09-16 DIAGNOSIS — Z86010 Personal history of colonic polyps: Secondary | ICD-10-CM

## 2017-09-16 DIAGNOSIS — F17210 Nicotine dependence, cigarettes, uncomplicated: Secondary | ICD-10-CM

## 2017-09-16 DIAGNOSIS — K219 Gastro-esophageal reflux disease without esophagitis: Secondary | ICD-10-CM

## 2017-09-16 DIAGNOSIS — Z79899 Other long term (current) drug therapy: Secondary | ICD-10-CM

## 2017-09-16 DIAGNOSIS — M1990 Unspecified osteoarthritis, unspecified site: Secondary | ICD-10-CM

## 2017-09-16 DIAGNOSIS — Z853 Personal history of malignant neoplasm of breast: Secondary | ICD-10-CM

## 2017-09-16 DIAGNOSIS — E559 Vitamin D deficiency, unspecified: Secondary | ICD-10-CM

## 2017-09-16 LAB — COMPREHENSIVE METABOLIC PANEL
ALK PHOS: 73 U/L (ref 38–126)
ALT: 12 U/L — AB (ref 14–54)
ANION GAP: 9 (ref 5–15)
AST: 22 U/L (ref 15–41)
Albumin: 3.3 g/dL — ABNORMAL LOW (ref 3.5–5.0)
BILIRUBIN TOTAL: 0.4 mg/dL (ref 0.3–1.2)
BUN: 11 mg/dL (ref 6–20)
CALCIUM: 8.8 mg/dL — AB (ref 8.9–10.3)
CO2: 25 mmol/L (ref 22–32)
CREATININE: 0.65 mg/dL (ref 0.44–1.00)
Chloride: 105 mmol/L (ref 101–111)
Glucose, Bld: 84 mg/dL (ref 65–99)
Potassium: 4.4 mmol/L (ref 3.5–5.1)
SODIUM: 139 mmol/L (ref 135–145)
Total Protein: 6.8 g/dL (ref 6.5–8.1)

## 2017-09-16 LAB — CBC WITH DIFFERENTIAL/PLATELET
BASOS ABS: 0.1 10*3/uL (ref 0–0.1)
BASOS PCT: 1 %
EOS PCT: 2 %
Eosinophils Absolute: 0.2 10*3/uL (ref 0–0.7)
HCT: 42.4 % (ref 35.0–47.0)
Hemoglobin: 14 g/dL (ref 12.0–16.0)
Lymphocytes Relative: 19 %
Lymphs Abs: 1.9 10*3/uL (ref 1.0–3.6)
MCH: 33.4 pg (ref 26.0–34.0)
MCHC: 32.9 g/dL (ref 32.0–36.0)
MCV: 101.4 fL — AB (ref 80.0–100.0)
Monocytes Absolute: 1.6 10*3/uL — ABNORMAL HIGH (ref 0.2–0.9)
Monocytes Relative: 17 %
NEUTROS ABS: 5.9 10*3/uL (ref 1.4–6.5)
NEUTROS PCT: 61 %
PLATELETS: 293 10*3/uL (ref 150–440)
RBC: 4.19 MIL/uL (ref 3.80–5.20)
RDW: 18.3 % — ABNORMAL HIGH (ref 11.5–14.5)
WBC: 9.7 10*3/uL (ref 3.6–11.0)

## 2017-09-16 LAB — MAGNESIUM: Magnesium: 2.1 mg/dL (ref 1.7–2.4)

## 2017-09-16 MED ORDER — FLUOROURACIL CHEMO INJECTION 2.5 GM/50ML
400.0000 mg/m2 | Freq: Once | INTRAVENOUS | Status: AC
  Administered 2017-09-16: 700 mg via INTRAVENOUS
  Filled 2017-09-16: qty 14

## 2017-09-16 MED ORDER — SODIUM CHLORIDE 0.9 % IV SOLN
2400.0000 mg/m2 | INTRAVENOUS | Status: DC
  Administered 2017-09-16: 4200 mg via INTRAVENOUS
  Filled 2017-09-16: qty 84

## 2017-09-16 MED ORDER — LEUCOVORIN CALCIUM INJECTION 100 MG
20.0000 mg/m2 | Freq: Once | INTRAMUSCULAR | Status: AC
  Administered 2017-09-16: 34 mg via INTRAVENOUS
  Filled 2017-09-16: qty 1.7

## 2017-09-16 MED ORDER — DEXTROSE 5 % IV SOLN
Freq: Once | INTRAVENOUS | Status: AC
  Administered 2017-09-16: 12:00:00 via INTRAVENOUS
  Filled 2017-09-16: qty 1000

## 2017-09-16 MED ORDER — PALONOSETRON HCL INJECTION 0.25 MG/5ML
0.2500 mg | Freq: Once | INTRAVENOUS | Status: AC
  Administered 2017-09-16: 0.25 mg via INTRAVENOUS
  Filled 2017-09-16: qty 5

## 2017-09-16 MED ORDER — LEUCOVORIN CALCIUM INJECTION 350 MG: 400.0000 mg/m2 | Freq: Once | INTRAVENOUS | Status: DC

## 2017-09-16 NOTE — Progress Notes (Signed)
Here for follow up. Stated has had intermittent light headed episodes- one y day.  No c/o of this today she stated.

## 2017-09-18 ENCOUNTER — Inpatient Hospital Stay: Payer: Medicare HMO

## 2017-09-18 DIAGNOSIS — G629 Polyneuropathy, unspecified: Secondary | ICD-10-CM | POA: Diagnosis not present

## 2017-09-18 DIAGNOSIS — C184 Malignant neoplasm of transverse colon: Secondary | ICD-10-CM | POA: Diagnosis not present

## 2017-09-18 DIAGNOSIS — K1230 Oral mucositis (ulcerative), unspecified: Secondary | ICD-10-CM | POA: Diagnosis not present

## 2017-09-18 DIAGNOSIS — C779 Secondary and unspecified malignant neoplasm of lymph node, unspecified: Secondary | ICD-10-CM | POA: Diagnosis not present

## 2017-09-18 DIAGNOSIS — M25569 Pain in unspecified knee: Secondary | ICD-10-CM | POA: Diagnosis not present

## 2017-09-18 DIAGNOSIS — Z5111 Encounter for antineoplastic chemotherapy: Secondary | ICD-10-CM | POA: Diagnosis not present

## 2017-09-18 DIAGNOSIS — M549 Dorsalgia, unspecified: Secondary | ICD-10-CM | POA: Diagnosis not present

## 2017-09-18 DIAGNOSIS — G8929 Other chronic pain: Secondary | ICD-10-CM | POA: Diagnosis not present

## 2017-09-18 MED ORDER — HEPARIN SOD (PORK) LOCK FLUSH 100 UNIT/ML IV SOLN
INTRAVENOUS | Status: AC
  Filled 2017-09-18: qty 5

## 2017-09-18 MED ORDER — HEPARIN SOD (PORK) LOCK FLUSH 100 UNIT/ML IV SOLN
500.0000 [IU] | Freq: Once | INTRAVENOUS | Status: AC | PRN
  Administered 2017-09-18: 500 [IU]

## 2017-09-18 MED ORDER — SODIUM CHLORIDE 0.9% FLUSH
10.0000 mL | INTRAVENOUS | Status: DC | PRN
  Administered 2017-09-18: 10 mL
  Filled 2017-09-18: qty 10

## 2017-09-25 DIAGNOSIS — C189 Malignant neoplasm of colon, unspecified: Secondary | ICD-10-CM | POA: Diagnosis not present

## 2017-09-25 DIAGNOSIS — Z933 Colostomy status: Secondary | ICD-10-CM | POA: Diagnosis not present

## 2017-09-27 ENCOUNTER — Other Ambulatory Visit: Payer: Self-pay | Admitting: Family Medicine

## 2017-09-27 DIAGNOSIS — C184 Malignant neoplasm of transverse colon: Secondary | ICD-10-CM | POA: Diagnosis not present

## 2017-09-29 DIAGNOSIS — Z933 Colostomy status: Secondary | ICD-10-CM | POA: Diagnosis not present

## 2017-09-29 DIAGNOSIS — C189 Malignant neoplasm of colon, unspecified: Secondary | ICD-10-CM | POA: Diagnosis not present

## 2017-09-30 DIAGNOSIS — Z933 Colostomy status: Secondary | ICD-10-CM | POA: Diagnosis not present

## 2017-09-30 DIAGNOSIS — C189 Malignant neoplasm of colon, unspecified: Secondary | ICD-10-CM | POA: Diagnosis not present

## 2017-09-30 NOTE — Progress Notes (Signed)
[facility] Follow up  Visit:  Patient Care Team: Birdie Sons, MD as PCP - General (Family Medicine)  PURPOSE OF VISIT: Adjuvant chemotherapy for colon cancer  HISTORY OF PRESENTING ILLNESS: Kimberly [name] 72 y.o. female with PMH listed as below presents for follow up for the management of Stage IIIB Colon Cancer Patient reports a remote history of breast cancer s/p lumpectomy and radiation treatments. Patient had been having abdominal pain, weight loss and bloating.  CT scan showed partial obstruction at the level of transverse colon and ileocolic mesenteric adenopathy. She was prepared for a colonoscopy on 04/15/2017. She started to have worsened abdominal pain, nausea vomiting, unable to maintain oral intake and was sent to ED. She was found to have bowel obstruction and underwent  exploratory laparotomy with transverse colectomy, with end colostomy and mucous fistula formation for obstructing colon lesion. Differential diagnosis prior to surgery was metastatic breast cancer in colon vs colon cancer. The patient did have a colonoscopy 2 years ago without the mass being present at that time but did have 2 small polyps in the transverse colon that were removed.  04/15/2017 Patient underwent transverse colon resection.  Pathology showed T4aN1 moderate differentiated adenocarcinoma, negative margin, 3/16 lymph nodes involved with cancer.  04/21/2017 She was seen in the ER earlier this week due to colostomy wound infection lateral to her right-sided end colostomy and to the midsection of her midline incision. She was started on antibiotics for cellulitis   TREATMENT: adjuvant FOLFOX, Oxaliplatin was discontinued after 3 cycles due to worsening of pre-existing neuropathy.   INTERVAL HISTORY Patient presents for evaluation prior to her cycle 10 5-FU adjuvant chemotherapy.  Patient tolerated chemotherapy well. Her energy levels are okay. Chronic neuropathy stable. Denies any  dizziness episode. Denies shortness of breath, chest pain, abdominal pain, leg swelling.  Denies any increased stoma output. Review of Systems  Constitutional: Negative for chills, diaphoresis, fatigue, fever and unexpected weight change.  HENT:   Negative for hearing loss, lump/mass, mouth sores and nosebleeds.   Eyes: Negative for eye problems and icterus.  Respiratory: Negative for chest tightness, cough, hemoptysis and shortness of breath.   Cardiovascular: Negative for chest pain, leg swelling and palpitations.  Gastrointestinal: Negative for abdominal distention, abdominal pain, blood in stool and constipation.  Endocrine: Negative for hot flashes.  Genitourinary: Negative for difficulty urinating, dyspareunia, dysuria and frequency.   Musculoskeletal: Negative for arthralgias, back pain, flank pain, gait problem and myalgias.  Skin: Negative for itching, rash and wound.  Neurological: Negative for dizziness, gait problem and headaches.       Chronic numbness and tingling of fingertips and toes.  Hematological: Negative for adenopathy. Does not bruise/bleed easily.  Psychiatric/Behavioral: Negative for confusion, decreased concentration and depression. The patient is not nervous/anxious.       MEDICAL HISTORY: Past Medical History:  Diagnosis Date  . Breast cancer ([place])   . Breast cancer, left ([facility])    Lumpectomy and rad tx's.  . Chronic back pain   . Chronic knee pain   . Colon cancer ([place])   . Degenerative disc disease, lumbar 04/2013  . Dyspnea   . Family history of adverse reaction to anesthesia    son arrested after anesthesia  . GERD (gastroesophageal reflux disease)   . Hyperlipidemia   . Iron deficiency anemia 05/27/2017  . Neuropathy   . Osteoarthritis    of knee  . Osteoporosis   . Tobacco abuse   . Vitamin D deficiency  SURGICAL HISTORY: Past Surgical History:  Procedure Laterality Date  . APPENDECTOMY  1965  . BREAST EXCISIONAL BIOPSY Left 08/01/2003    lumpectomy rad 11/04-2/28/2005  . CESAREAN SECTION     x3  . COLON SURGERY    . COLONOSCOPY W/ POLYPECTOMY    . ESOPHAGOGASTRODUODENOSCOPY (EGD) WITH PROPOFOL N/A 04/04/2017   Procedure: ESOPHAGOGASTRODUODENOSCOPY (EGD) WITH PROPOFOL;  Surgeon: [name], MD;  Location: [place];  Service: Endoscopy;  Laterality: N/A;  . HIP FRACTURE SURGERY Left 01/25/2012  . LAPAROTOMY N/A 04/15/2017   Procedure: EXPLORATORY LAPAROTOMY;  Surgeon: Clayburn Pert, MD;  Location: [facility];  Service: General;  Laterality: N/A;  . NECK SURGERY  12/2011  . PORTACATH PLACEMENT Right 05/21/2017   Procedure: INSERTION PORT-A-CATH;  Surgeon: Clayburn Pert, MD;  Location: [facility];  Service: General;  Laterality: Right;  . WRIST FRACTURE SURGERY      SOCIAL HISTORY: Social History   Socioeconomic History  . Marital status: Divorced    Spouse name: Not on file  . Number of children: Not on file  . Years of education: Not on file  . Highest education level: Not on file  Social Needs  . Financial resource strain: Not on file  . Food insecurity - worry: Not on file  . Food insecurity - inability: Not on file  . Transportation needs - medical: Not on file  . Transportation needs - non-medical: Not on file  Occupational History  . Not on file  Tobacco Use  . Smoking status: Former Smoker    Packs/day: 0.25    Years: 55.00    Pack years: 13.75    Types: Cigarettes    Last attempt to quit: 05/21/2017    Years since quitting: 0.3  . Smokeless tobacco: Never Used  . Tobacco comment: started age 45 1/3 to 1 ppd  Substance and Sexual Activity  . Alcohol use: Yes    Alcohol/week: 0.0 oz    Comment: occasionally drinks beer  . Drug use: No  . Sexual activity: No  Other Topics Concern  . Not on file  Social History Narrative  . Not on file    FAMILY HISTORY Family History  Problem Relation Age of Onset  . Diabetes Mother        type 2  . Coronary artery disease Mother   . Deep  vein thrombosis Mother   . Colon cancer Father   . Asthma Brother   . Diabetes Brother        type 2    ALLERGIES:  is allergic to iodine; other; and sinus formula [cholestatin].  MEDICATIONS:  Current Outpatient Medications  Medication Sig Dispense Refill  . budesonide-formoterol (SYMBICORT) 80-4.5 MCG/ACT inhaler Inhale 2 puffs into the lungs 2 (two) times daily.    . Calcium Citrate-Vitamin D (CITRACAL/VITAMIN D PO) Take 1 tablet by mouth daily.     . chlorhexidine (PERIDEX) 0.12 % solution Use as directed 15 mLs in the mouth or throat 2 (two) times daily. Swish and spit 473 mL 0  . Cyanocobalamin (VITAMIN B 12 PO)     . feeding supplement (BOOST / RESOURCE BREEZE) LIQD Take 1 Container by mouth 3 (three) times daily between meals. (Patient not taking: Reported on 09/16/2017) 30 Container 0  . gabapentin (NEURONTIN) 400 MG capsule TAKE 1 CAPSULE TWICE DAILY 180 capsule 4  . HYDROcodone-acetaminophen (NORCO) 7.5-325 MG tablet Take 1-2 tablets by mouth every 6 (six) hours as needed for moderate pain. 120 tablet 0  .  ibuprofen (ADVIL,MOTRIN) 200 MG tablet Take 400 mg by mouth daily as needed for headache or moderate pain.    Marland Kitchen lidocaine-prilocaine (EMLA) cream Apply to affected area once 30 g 3  . LORazepam (ATIVAN) 0.5 MG tablet Take 1 tablet (0.5 mg total) by mouth every 6 (six) hours as needed (Nausea or vomiting). 30 tablet 0  . magnesium chloride (SLOW-MAG) 64 MG TBEC SR tablet Take 1 tablet (64 mg total) by mouth daily. (Patient not taking: Reported on 09/16/2017) 30 tablet 3  . meloxicam (MOBIC) 15 MG tablet TAKE 1 TABLET EVERY DAY AS NEEDED (Patient not taking: Reported on 09/16/2017) 90 tablet 4  . omeprazole (PRILOSEC) 20 MG capsule TAKE 1 CAPSULE EVERY DAY 90 capsule 4  . ondansetron (ZOFRAN) 8 MG tablet Take 1 tablet (8 mg total) by mouth 2 (two) times daily as needed for refractory nausea / vomiting. Start on day 3 after chemotherapy. 30 tablet 1  . pravastatin (PRAVACHOL) 40  MG tablet TAKE 1 TABLET (40 MG TOTAL) BY MOUTH DAILY. 90 tablet 4  . Probiotic Product (PROBIOTIC DAILY PO) Take 1 capsule by mouth daily.     . prochlorperazine (COMPAZINE) 10 MG tablet Take 1 tablet (10 mg total) by mouth every 6 (six) hours as needed (Nausea or vomiting). (Patient not taking: Reported on 09/16/2017) 30 tablet 1  . pyridOXINE (VITAMIN B-6) 50 MG tablet Take 1 tablet (50 mg total) by mouth daily. 30 tablet 3  . simethicone (MYLICON) 80 MG chewable tablet Chew 2 tablets (160 mg total) by mouth every 6 (six) hours as needed for flatulence. (Patient not taking: Reported on 09/16/2017) 120 tablet 0  . Triamcinolone Acetonide (NASACORT ALLERGY 24HR NA) Place 1 spray into the nose daily as needed (allergies).     No current facility-administered medications for this visit.    Facility-Administered Medications Ordered in Other Visits  Medication Dose Route Frequency Provider Last Rate Last Dose  . 0.9 %  sodium chloride infusion   Intravenous Continuous Earlie Server, MD   Stopped at 07/22/17 1150  . heparin lock flush 100 unit/mL  500 Units Intravenous Once Earlie Server, MD      . heparin lock flush 100 unit/mL  500 Units Intravenous Once Earlie Server, MD      . heparin lock flush 100 unit/mL  500 Units Intravenous Once Earlie Server, MD      . sodium chloride flush (NS) 0.9 % injection 10 mL  10 mL Intracatheter PRN Earlie Server, MD      . sodium chloride flush (NS) 0.9 % injection 10 mL  10 mL Intravenous PRN Earlie Server, MD   10 mL at 05/29/17 1347    PHYSICAL EXAMINATION:  ECOG PERFORMANCE STATUS: 0 - Asymptomatic  There were no vitals filed for this visit. There were no vitals filed for this visit.  Physical Exam  Constitutional: She is oriented to person, place, and time and well-developed, well-nourished, and in no distress. No distress.  HENT:  Head: Normocephalic and atraumatic.  Mouth/Throat: No oropharyngeal exudate.  Eyes: Conjunctivae and EOM are normal. Pupils are equal, round, and  reactive to light. No scleral icterus.  Neck: Normal range of motion. Neck supple. No JVD present.  Cardiovascular: Normal rate and regular rhythm. Exam reveals no gallop.  No murmur heard. Pulmonary/Chest: Effort normal and breath sounds normal. No respiratory distress.  Abdominal: Soft. Bowel sounds are normal. She exhibits no distension and no mass. There is no rebound.  (+)Colostomy  Musculoskeletal: Normal range  of motion. She exhibits no edema or tenderness.  Lymphadenopathy:    She has no cervical adenopathy.  Neurological: She is alert and oriented to person, place, and time. No cranial nerve deficit. Gait normal.  Skin: Skin is warm and dry. No rash noted. No erythema.  Psychiatric: Affect and judgment normal.      LABORATORY DATA: I have personally reviewed the data as listed: CBC Latest Ref Rng & Units 10/01/2017 09/16/2017 09/02/2017  WBC 3.6 - 11.0 K/uL 13.3(H) 9.7 9.8  Hemoglobin 12.0 - 16.0 g/dL 14.5 14.0 13.7  Hematocrit 35.0 - 47.0 % 42.1 42.4 40.4  Platelets 150 - 440 K/uL 378 293 259   CMP Latest Ref Rng & Units 09/16/2017 09/02/2017 08/18/2017  Glucose 65 - 99 mg/dL 84 94 118(H)  BUN 6 - 20 mg/dL '11 9 18  ' Creatinine 0.44 - 1.00 mg/dL 0.65 0.70 0.77  Sodium 135 - 145 mmol/L 139 133(L) 134(L)  Potassium 3.5 - 5.1 mmol/L 4.4 3.8 3.9  Chloride 101 - 111 mmol/L 105 99(L) 100(L)  CO2 22 - 32 mmol/L '25 27 31  ' Calcium 8.9 - 10.3 mg/dL 8.8(L) 8.8(L) 8.6(L)  Total Protein 6.5 - 8.1 g/dL 6.8 6.9 6.6  Total Bilirubin 0.3 - 1.2 mg/dL 0.4 0.4 0.3  Alkaline Phos 38 - 126 U/L 73 64 71  AST 15 - 41 U/L '22 21 20  ' ALT 14 - 54 U/L 12(L) 13(L) 12(L)    RADIOGRAPHIC STUDIES: I have personally reviewed the radiological images as listed and agree with the findings in the report 04/08/2017 CT abdomen pelvis w contrast IMPRESSION: 1. Partial obstruction at the level of the transverse colon, likely secondary to carcinoma. 2. Ileocolic mesenteric adenopathy, suspicious for nodal  metastasis. 3.  Aortic Atherosclerosis (ICD10-I70.0). 4. Bilateral adrenal adenomas. 04/21/2017 CT abdomen pelvis with contrast. IMPRESSION: Postsurgical changes consistent with the given clinical history. A small amount of fluid in a year is noted surrounding the right lower quadrant ostomy likely felt to be postoperative in nature. No definitive abscess is seen. The patient's palpable abnormality likely corresponds to the small fluid collection.  PATHOLOGY DIAGNOSIS:  A. COLON MASS, TRANSVERSE; TRANSVERSE COLECTOMY:  - INVASIVE ADENOCARCINOMA, MODERATELY DIFFERENTIATED.  - THREE OF SIXTEEN LYMPH NODES INVOLVED BY METASTASIS (3/16).  - LYMPHOVASCULAR AND PERINEURAL INVASION PRESENT.  - SEE SUMMARY BELOW.  Surgical Pathology Cancer Case Summary  COLON AND RECTUM:  Procedure: transverse colectomy  Tumor Site:  transverse colon  Tumor Size: Greatest dimension: 2.3 cm  Macroscopic Tumor Perforation: not specified  Histologic Type: adenocarcinoma  Histologic Grade: G2, moderately differentiated  Tumor Extension: tumor invades the visceral peritoneum  Margins: all margins are uninvolved by invasive carcinoma, high-grade dysplasia, intramucosal adenocarcinoma, and adenoma  Treatment Effect: no known presurgical therapy  Lymphovascular Invasion: present  Perineural Invasion: present  Tumor Deposits: not identified  Regional Lymph Nodes: # examined: 16  # involved: 3  Pathologic Stage Classification (pTNM, AJCC 8th Edition): pT4a pN1b   ADDENDUM:   MICROSATELLITE INSTABILITY IMMUNOHISTOCHEMISTRY  MISMATCH REPAIR PROTEINS:   MLH1: Intact nuclear expression  MSH2: Intact nuclear expression  MSH6: Intact nuclear expression  PMS2: Intact nuclear expression   Interpretation: No loss of nuclear expression of mismatch repair  proteins: Low probability of MSI-H.   Addendum: Genetic test INVITAE came back negative. No pathogenetic sequence variants or deletions/duplications  identified. Results was scanned to [employer] (a copy of the report was provided to patient and her daughter)  ASSESSMENT/PLAN 72yo female who has MSI  stable stage  IIIB Colon Cancer starting adjuvant 5-FU # baseline CEA is 2.1  Cancer Staging Malignant neoplasm of transverse colon [facility]) Staging form: Colon and Rectum, AJCC 8th Edition - Clinical stage from 04/15/2017: Stage IIIB (cT4a, cN1b, cM0) - Signed by Earlie Server, MD on 04/26/2017  1. Encounter for antineoplastic chemotherapy   2. Malignant neoplasm of transverse colon ([place])   3. Monocytosis   4. Neutrophilia    #Okay to proceed cycle 10 5FU.  # T4 disease,will need RT after completion of chemo. # Persistent neutrophilia and monocytosis, reviewed patient's previous labs, she has long-standing monocytosis with intermittent neutrophilia. Will send workup including flow cytometry and BCR ABL. . # Chronic hypomagnesia: resolved. Magnesium at 2.0. Stable. # Grade 2 neuropathy: Pre-existing peripheral neuropathy, stable. Continue vitamin B6..  # mucositis: Resolved. # Dizziness, no any further dizziness episodes.   Follow up in 2 weeks on day 1 of cycle 11. Patient knows to call if any questions or concerns.  Earlie Server, MD, PhD Hematology Oncology [facility] Pager- [phone]

## 2017-10-01 ENCOUNTER — Inpatient Hospital Stay: Payer: Medicare HMO

## 2017-10-01 ENCOUNTER — Inpatient Hospital Stay: Payer: Medicare HMO | Attending: Oncology

## 2017-10-01 ENCOUNTER — Inpatient Hospital Stay (HOSPITAL_BASED_OUTPATIENT_CLINIC_OR_DEPARTMENT_OTHER): Payer: Medicare HMO | Admitting: Oncology

## 2017-10-01 ENCOUNTER — Inpatient Hospital Stay: Payer: Medicare HMO | Admitting: Oncology

## 2017-10-01 ENCOUNTER — Other Ambulatory Visit: Payer: Self-pay

## 2017-10-01 VITALS — BP 163/89 | HR 83 | Temp 97.7°F | Resp 18 | Wt 155.6 lb

## 2017-10-01 DIAGNOSIS — Z800 Family history of malignant neoplasm of digestive organs: Secondary | ICD-10-CM

## 2017-10-01 DIAGNOSIS — F17210 Nicotine dependence, cigarettes, uncomplicated: Secondary | ICD-10-CM | POA: Diagnosis not present

## 2017-10-01 DIAGNOSIS — D729 Disorder of white blood cells, unspecified: Secondary | ICD-10-CM

## 2017-10-01 DIAGNOSIS — D72821 Monocytosis (symptomatic): Secondary | ICD-10-CM

## 2017-10-01 DIAGNOSIS — D3501 Benign neoplasm of right adrenal gland: Secondary | ICD-10-CM

## 2017-10-01 DIAGNOSIS — G8929 Other chronic pain: Secondary | ICD-10-CM | POA: Insufficient documentation

## 2017-10-01 DIAGNOSIS — M549 Dorsalgia, unspecified: Secondary | ICD-10-CM

## 2017-10-01 DIAGNOSIS — R599 Enlarged lymph nodes, unspecified: Secondary | ICD-10-CM | POA: Diagnosis not present

## 2017-10-01 DIAGNOSIS — R0602 Shortness of breath: Secondary | ICD-10-CM | POA: Insufficient documentation

## 2017-10-01 DIAGNOSIS — I700 Atherosclerosis of aorta: Secondary | ICD-10-CM | POA: Insufficient documentation

## 2017-10-01 DIAGNOSIS — C184 Malignant neoplasm of transverse colon: Secondary | ICD-10-CM | POA: Diagnosis not present

## 2017-10-01 DIAGNOSIS — D509 Iron deficiency anemia, unspecified: Secondary | ICD-10-CM

## 2017-10-01 DIAGNOSIS — M5136 Other intervertebral disc degeneration, lumbar region: Secondary | ICD-10-CM | POA: Insufficient documentation

## 2017-10-01 DIAGNOSIS — Z79899 Other long term (current) drug therapy: Secondary | ICD-10-CM | POA: Diagnosis not present

## 2017-10-01 DIAGNOSIS — G629 Polyneuropathy, unspecified: Secondary | ICD-10-CM | POA: Diagnosis not present

## 2017-10-01 DIAGNOSIS — J069 Acute upper respiratory infection, unspecified: Secondary | ICD-10-CM | POA: Diagnosis not present

## 2017-10-01 DIAGNOSIS — Z5111 Encounter for antineoplastic chemotherapy: Secondary | ICD-10-CM | POA: Diagnosis not present

## 2017-10-01 DIAGNOSIS — Z933 Colostomy status: Secondary | ICD-10-CM | POA: Insufficient documentation

## 2017-10-01 DIAGNOSIS — K219 Gastro-esophageal reflux disease without esophagitis: Secondary | ICD-10-CM | POA: Insufficient documentation

## 2017-10-01 DIAGNOSIS — Z853 Personal history of malignant neoplasm of breast: Secondary | ICD-10-CM

## 2017-10-01 DIAGNOSIS — M25569 Pain in unspecified knee: Secondary | ICD-10-CM | POA: Diagnosis not present

## 2017-10-01 DIAGNOSIS — Z86010 Personal history of colonic polyps: Secondary | ICD-10-CM | POA: Insufficient documentation

## 2017-10-01 DIAGNOSIS — Z923 Personal history of irradiation: Secondary | ICD-10-CM

## 2017-10-01 DIAGNOSIS — Z87891 Personal history of nicotine dependence: Secondary | ICD-10-CM | POA: Insufficient documentation

## 2017-10-01 DIAGNOSIS — M1990 Unspecified osteoarthritis, unspecified site: Secondary | ICD-10-CM | POA: Diagnosis not present

## 2017-10-01 DIAGNOSIS — M810 Age-related osteoporosis without current pathological fracture: Secondary | ICD-10-CM | POA: Diagnosis not present

## 2017-10-01 DIAGNOSIS — E785 Hyperlipidemia, unspecified: Secondary | ICD-10-CM | POA: Diagnosis not present

## 2017-10-01 DIAGNOSIS — D720 Genetic anomalies of leukocytes: Secondary | ICD-10-CM

## 2017-10-01 DIAGNOSIS — D3502 Benign neoplasm of left adrenal gland: Secondary | ICD-10-CM | POA: Diagnosis not present

## 2017-10-01 DIAGNOSIS — E559 Vitamin D deficiency, unspecified: Secondary | ICD-10-CM | POA: Insufficient documentation

## 2017-10-01 LAB — CBC WITH DIFFERENTIAL/PLATELET
Basophils Absolute: 0.1 10*3/uL (ref 0–0.1)
Basophils Relative: 1 %
EOS PCT: 3 %
Eosinophils Absolute: 0.3 10*3/uL (ref 0–0.7)
HCT: 42.1 % (ref 35.0–47.0)
Hemoglobin: 14.5 g/dL (ref 12.0–16.0)
LYMPHS ABS: 3 10*3/uL (ref 1.0–3.6)
LYMPHS PCT: 23 %
MCH: 34.7 pg — AB (ref 26.0–34.0)
MCHC: 34.4 g/dL (ref 32.0–36.0)
MCV: 101.1 fL — AB (ref 80.0–100.0)
MONO ABS: 1.7 10*3/uL — AB (ref 0.2–0.9)
MONOS PCT: 13 %
Neutro Abs: 8.1 10*3/uL — ABNORMAL HIGH (ref 1.4–6.5)
Neutrophils Relative %: 60 %
Platelets: 378 10*3/uL (ref 150–440)
RBC: 4.17 MIL/uL (ref 3.80–5.20)
RDW: 15.9 % — AB (ref 11.5–14.5)
WBC: 13.3 10*3/uL — ABNORMAL HIGH (ref 3.6–11.0)

## 2017-10-01 LAB — COMPREHENSIVE METABOLIC PANEL
ALBUMIN: 3.5 g/dL (ref 3.5–5.0)
ALT: 12 U/L — ABNORMAL LOW (ref 14–54)
AST: 20 U/L (ref 15–41)
Alkaline Phosphatase: 83 U/L (ref 38–126)
Anion gap: 9 (ref 5–15)
BILIRUBIN TOTAL: 0.4 mg/dL (ref 0.3–1.2)
BUN: 12 mg/dL (ref 6–20)
CHLORIDE: 101 mmol/L (ref 101–111)
CO2: 24 mmol/L (ref 22–32)
Calcium: 8.9 mg/dL (ref 8.9–10.3)
Creatinine, Ser: 0.83 mg/dL (ref 0.44–1.00)
GFR calc Af Amer: >60 mL/min (ref >60)
GFR calc non Af Amer: >60 mL/min (ref >60)
GLUCOSE: 110 mg/dL — AB (ref 65–99)
POTASSIUM: 4.1 mmol/L (ref 3.5–5.1)
Sodium: 134 mmol/L — ABNORMAL LOW (ref 135–145)
TOTAL PROTEIN: 7 g/dL (ref 6.5–8.1)

## 2017-10-01 LAB — VITAMIN B12: Vitamin B-12: 1251 pg/mL — ABNORMAL HIGH (ref 180–914)

## 2017-10-01 LAB — MAGNESIUM: Magnesium: 2 mg/dL (ref 1.7–2.4)

## 2017-10-01 LAB — FOLATE: Folate: 38 ng/mL (ref >5.9)

## 2017-10-01 MED ORDER — SODIUM CHLORIDE 0.9 % IV SOLN
2400.0000 mg/m2 | INTRAVENOUS | Status: AC
  Administered 2017-10-01: 4200 mg via INTRAVENOUS
  Filled 2017-10-01: qty 84

## 2017-10-01 MED ORDER — LEUCOVORIN CALCIUM INJECTION 100 MG
20.0000 mg/m2 | Freq: Once | INTRAMUSCULAR | Status: AC
  Administered 2017-10-01: 34 mg via INTRAVENOUS
  Filled 2017-10-01: qty 1.7

## 2017-10-01 MED ORDER — PALONOSETRON HCL INJECTION 0.25 MG/5ML
0.2500 mg | Freq: Once | INTRAVENOUS | Status: AC
  Administered 2017-10-01: 0.25 mg via INTRAVENOUS
  Filled 2017-10-01: qty 5

## 2017-10-01 MED ORDER — DEXTROSE 5 % IV SOLN: 400.0000 mg/m2 | Freq: Once | INTRAVENOUS | Status: DC

## 2017-10-01 MED ORDER — DEXTROSE 5 % IV SOLN
Freq: Once | INTRAVENOUS | Status: AC
  Administered 2017-10-01: 16:00:00 via INTRAVENOUS

## 2017-10-01 MED ORDER — FLUOROURACIL CHEMO INJECTION 2.5 GM/50ML: 400.0000 mg/m2 | Freq: Once | INTRAVENOUS | Status: DC

## 2017-10-01 MED ORDER — FLUOROURACIL CHEMO INJECTION 2.5 GM/50ML
400.0000 mg/m2 | Freq: Once | INTRAVENOUS | Status: AC
  Administered 2017-10-01: 700 mg via INTRAVENOUS
  Filled 2017-10-01: qty 14

## 2017-10-01 NOTE — Progress Notes (Signed)
Here for follow up. Stated overall she is doing well

## 2017-10-02 ENCOUNTER — Encounter: Payer: Self-pay | Admitting: Oncology

## 2017-10-03 ENCOUNTER — Inpatient Hospital Stay: Payer: Medicare HMO

## 2017-10-03 VITALS — BP 120/76 | HR 97 | Temp 97.4°F | Resp 18

## 2017-10-03 DIAGNOSIS — D720 Genetic anomalies of leukocytes: Secondary | ICD-10-CM | POA: Diagnosis not present

## 2017-10-03 DIAGNOSIS — C184 Malignant neoplasm of transverse colon: Secondary | ICD-10-CM

## 2017-10-03 DIAGNOSIS — D72821 Monocytosis (symptomatic): Secondary | ICD-10-CM | POA: Diagnosis not present

## 2017-10-03 DIAGNOSIS — M25569 Pain in unspecified knee: Secondary | ICD-10-CM | POA: Diagnosis not present

## 2017-10-03 DIAGNOSIS — G629 Polyneuropathy, unspecified: Secondary | ICD-10-CM | POA: Diagnosis not present

## 2017-10-03 DIAGNOSIS — Z5111 Encounter for antineoplastic chemotherapy: Secondary | ICD-10-CM | POA: Diagnosis not present

## 2017-10-03 DIAGNOSIS — J069 Acute upper respiratory infection, unspecified: Secondary | ICD-10-CM | POA: Diagnosis not present

## 2017-10-03 DIAGNOSIS — D729 Disorder of white blood cells, unspecified: Secondary | ICD-10-CM | POA: Diagnosis not present

## 2017-10-03 DIAGNOSIS — M549 Dorsalgia, unspecified: Secondary | ICD-10-CM | POA: Diagnosis not present

## 2017-10-03 LAB — COMP PANEL: LEUKEMIA/LYMPHOMA

## 2017-10-03 MED ORDER — HEPARIN SOD (PORK) LOCK FLUSH 100 UNIT/ML IV SOLN
500.0000 [IU] | Freq: Once | INTRAVENOUS | Status: AC | PRN
  Administered 2017-10-03: 500 [IU]

## 2017-10-03 MED ORDER — SODIUM CHLORIDE 0.9% FLUSH
10.0000 mL | INTRAVENOUS | Status: DC | PRN
  Administered 2017-10-03: 10 mL
  Filled 2017-10-03: qty 10

## 2017-10-07 ENCOUNTER — Other Ambulatory Visit: Payer: Self-pay | Admitting: Oncology

## 2017-10-11 LAB — BCR-ABL1 FISH
Cells Analyzed: 200
Cells Counted: 200
PDF LCA: 0

## 2017-10-14 ENCOUNTER — Other Ambulatory Visit: Payer: Self-pay | Admitting: Family Medicine

## 2017-10-14 DIAGNOSIS — M5136 Other intervertebral disc degeneration, lumbar region: Secondary | ICD-10-CM

## 2017-10-14 MED ORDER — HYDROCODONE-ACETAMINOPHEN 7.5-325 MG PO TABS: 1.0000 | ORAL_TABLET | Freq: Four times a day (QID) | ORAL | 0 refills | Status: DC | PRN

## 2017-10-14 NOTE — Progress Notes (Signed)
[place] [facility] Follow up  Visit:  Patient Care Team: Birdie Sons, MD as PCP - General (Family Medicine)  PURPOSE OF VISIT: Adjuvant chemotherapy for colon cancer  HISTORY OF PRESENTING ILLNESS: Kimberly [name] 72 y.o. female with PMH listed as below presents for follow up for the management of Stage IIIB Colon Cancer Patient reports a remote history of breast cancer s/p lumpectomy and radiation treatments. Patient had been having abdominal pain, weight loss and bloating.  CT scan showed partial obstruction at the level of transverse colon and ileocolic mesenteric adenopathy. She was prepared for a colonoscopy on 04/15/2017. She started to have worsened abdominal pain, nausea vomiting, unable to maintain oral intake and was sent to ED. She was found to have bowel obstruction and underwent  exploratory laparotomy with transverse colectomy, with end colostomy and mucous fistula formation for obstructing colon lesion. Differential diagnosis prior to surgery was metastatic breast cancer in colon vs colon cancer. The patient did have a colonoscopy 2 years ago without the mass being present at that time but did have 2 small polyps in the transverse colon that were removed.  04/15/2017 Patient underwent transverse colon resection.  Pathology showed T4aN1 moderate differentiated adenocarcinoma, negative margin, 3/16 lymph nodes involved with cancer.  04/21/2017 She was seen in the ER earlier this week due to colostomy wound infection lateral to her right-sided end colostomy and to the midsection of her midline incision. She was started on antibiotics for cellulitis   TREATMENT: adjuvant FOLFOX, Oxaliplatin was discontinued after 3 cycles due to worsening of pre-existing neuropathy.   INTERVAL HISTORY Patient presents for evaluation prior to her cycle 11 5-FU adjuvant chemotherapy.  Patient tolerated last chemotherapy well. Patient's chronic neuropathy appears stable. Patient reports  feeling extremely tired for the past 3-4 days. She had nasal congestion and runny nose. Appetite is well. She also reports some abdominal gas discomfort. Denies any focal tenderness. Denies any diarrhea, nausea and vomiting. . Review of Systems  Constitutional: Positive for fatigue. Negative for chills, diaphoresis, fever and unexpected weight change.  HENT:   Negative for hearing loss and nosebleeds.   Respiratory: Negative for chest tightness and shortness of breath.   Cardiovascular: Negative for chest pain.  Gastrointestinal: Negative for abdominal distention, constipation, diarrhea and nausea.  Endocrine: Negative for hot flashes.  Genitourinary: Negative for difficulty urinating, dysuria and frequency.   Musculoskeletal: Negative for arthralgias, gait problem and myalgias.  Skin: Negative for rash and wound.  Neurological: Negative for dizziness, gait problem and headaches.       Chronic numbness and tingling of fingertips and toes.  Hematological: Negative for adenopathy. Does not bruise/bleed easily.  Psychiatric/Behavioral: Negative for confusion, decreased concentration and depression. The patient is not nervous/anxious.       MEDICAL HISTORY: Past Medical History:  Diagnosis Date  . Breast cancer ([place])   . Breast cancer, left (HCC)    Lumpectomy and rad tx's.  . Chronic back pain   . Chronic knee pain   . Colon cancer ([place])   . Degenerative disc disease, lumbar 04/2013  . Dyspnea   . Family history of adverse reaction to anesthesia    son arrested after anesthesia  . GERD (gastroesophageal reflux disease)   . Hyperlipidemia   . Iron deficiency anemia 05/27/2017  . Neuropathy   . Osteoarthritis    of knee  . Osteoporosis   . Tobacco abuse   . Vitamin D deficiency     SURGICAL HISTORY: Past Surgical  History:  Procedure Laterality Date  . APPENDECTOMY  1965  . BREAST EXCISIONAL BIOPSY Left 08/01/2003   lumpectomy rad 11/04-2/28/2005  . CESAREAN SECTION     x3   . COLON SURGERY    . COLONOSCOPY W/ POLYPECTOMY    . ESOPHAGOGASTRODUODENOSCOPY (EGD) WITH PROPOFOL N/A 04/04/2017   Procedure: ESOPHAGOGASTRODUODENOSCOPY (EGD) WITH PROPOFOL;  Surgeon: [name], MD;  Location: [place];  Service: Endoscopy;  Laterality: N/A;  . HIP FRACTURE SURGERY Left 01/25/2012  . LAPAROTOMY N/A 04/15/2017   Procedure: EXPLORATORY LAPAROTOMY;  Surgeon: Clayburn Pert, MD;  Location: [facility];  Service: General;  Laterality: N/A;  . NECK SURGERY  12/2011  . PORTACATH PLACEMENT Right 05/21/2017   Procedure: INSERTION PORT-A-CATH;  Surgeon: Clayburn Pert, MD;  Location: [facility];  Service: General;  Laterality: Right;  . WRIST FRACTURE SURGERY      SOCIAL HISTORY: Social History   Socioeconomic History  . Marital status: Divorced    Spouse name: Not on file  . Number of children: Not on file  . Years of education: Not on file  . Highest education level: Not on file  Social Needs  . Financial resource strain: Not on file  . Food insecurity - worry: Not on file  . Food insecurity - inability: Not on file  . Transportation needs - medical: Not on file  . Transportation needs - non-medical: Not on file  Occupational History  . Not on file  Tobacco Use  . Smoking status: Former Smoker    Packs/day: 0.25    Years: 55.00    Pack years: 13.75    Types: Cigarettes    Last attempt to quit: 05/21/2017    Years since quitting: 0.4  . Smokeless tobacco: Never Used  . Tobacco comment: started age 38 1/2 to 1 ppd  Substance and Sexual Activity  . Alcohol use: Yes    Alcohol/week: 0.0 oz    Comment: occasionally drinks beer  . Drug use: No  . Sexual activity: No  Other Topics Concern  . Not on file  Social History Narrative  . Not on file    FAMILY HISTORY Family History  Problem Relation Age of Onset  . Diabetes Mother        type 2  . Coronary artery disease Mother   . Deep vein thrombosis Mother   . Colon cancer Father   . Asthma  Brother   . Diabetes Brother        type 2    ALLERGIES:  is allergic to iodine; other; and sinus formula [cholestatin].  MEDICATIONS:  Current Outpatient Medications  Medication Sig Dispense Refill  . budesonide-formoterol (SYMBICORT) 80-4.5 MCG/ACT inhaler Inhale 2 puffs into the lungs 2 (two) times daily.    . Calcium Citrate-Vitamin D (CITRACAL/VITAMIN D PO) Take 1 tablet by mouth daily.     . chlorhexidine (PERIDEX) 0.12 % solution USE AS DIRECTED 15 MLS IN THE MOUTH OR THROAT 2 (TWO) TIMES DAILY. SWISH AND SPIT 473 mL 0  . Cyanocobalamin (VITAMIN B 12 PO)     . feeding supplement (BOOST / RESOURCE BREEZE) LIQD Take 1 Container by mouth 3 (three) times daily between meals. 30 Container 0  . gabapentin (NEURONTIN) 400 MG capsule TAKE 1 CAPSULE TWICE DAILY 180 capsule 4  . HYDROcodone-acetaminophen (NORCO) 7.5-325 MG tablet Take 1-2 tablets by mouth every 6 (six) hours as needed for moderate pain. 120 tablet 0  . ibuprofen (ADVIL,MOTRIN) 200 MG tablet Take 400 mg by mouth  daily as needed for headache or moderate pain.    Marland Kitchen lidocaine-prilocaine (EMLA) cream Apply to affected area once 30 g 3  . LORazepam (ATIVAN) 0.5 MG tablet Take 1 tablet (0.5 mg total) by mouth every 6 (six) hours as needed (Nausea or vomiting). 30 tablet 0  . magnesium chloride (SLOW-MAG) 64 MG TBEC SR tablet Take 1 tablet (64 mg total) by mouth daily. 30 tablet 3  . meloxicam (MOBIC) 15 MG tablet TAKE 1 TABLET EVERY DAY AS NEEDED 90 tablet 4  . omeprazole (PRILOSEC) 20 MG capsule TAKE 1 CAPSULE EVERY DAY 90 capsule 4  . ondansetron (ZOFRAN) 8 MG tablet Take 1 tablet (8 mg total) by mouth 2 (two) times daily as needed for refractory nausea / vomiting. Start on day 3 after chemotherapy. 30 tablet 1  . pravastatin (PRAVACHOL) 40 MG tablet TAKE 1 TABLET (40 MG TOTAL) BY MOUTH DAILY. 90 tablet 4  . Probiotic Product (PROBIOTIC DAILY PO) Take 1 capsule by mouth daily.     . prochlorperazine (COMPAZINE) 10 MG tablet Take 1  tablet (10 mg total) by mouth every 6 (six) hours as needed (Nausea or vomiting). 30 tablet 1  . pyridOXINE (VITAMIN B-6) 50 MG tablet Take 1 tablet (50 mg total) by mouth daily. 30 tablet 3  . simethicone (MYLICON) 80 MG chewable tablet Chew 2 tablets (160 mg total) by mouth every 6 (six) hours as needed for flatulence. 120 tablet 0  . Triamcinolone Acetonide (NASACORT ALLERGY 24HR NA) Place 1 spray into the nose daily as needed (allergies).     No current facility-administered medications for this visit.    Facility-Administered Medications Ordered in Other Visits  Medication Dose Route Frequency Provider Last Rate Last Dose  . 0.9 %  sodium chloride infusion   Intravenous Continuous Earlie Server, MD   Stopped at 07/22/17 1150  . heparin lock flush 100 unit/mL  500 Units Intravenous Once Earlie Server, MD      . heparin lock flush 100 unit/mL  500 Units Intravenous Once Earlie Server, MD      . heparin lock flush 100 unit/mL  500 Units Intravenous Once Earlie Server, MD      . sodium chloride flush (NS) 0.9 % injection 10 mL  10 mL Intracatheter PRN Earlie Server, MD      . sodium chloride flush (NS) 0.9 % injection 10 mL  10 mL Intravenous PRN Earlie Server, MD   10 mL at 05/29/17 1347    PHYSICAL EXAMINATION:  ECOG PERFORMANCE STATUS: 0 - Asymptomatic  Vitals:   10/15/17 1402  BP: 114/66  Pulse: 82  Resp: 18  Temp: 99.1 F (37.3 C)   Filed Weights   10/15/17 1402  Weight: 156 lb (70.8 kg)    Physical Exam  Constitutional: She is oriented to person, place, and time and well-developed, well-nourished, and in no distress. No distress.  HENT:  Head: Normocephalic and atraumatic.  Mouth/Throat: Oropharynx is clear and moist. No oropharyngeal exudate.  Eyes: Conjunctivae and EOM are normal. Pupils are equal, round, and reactive to light. Left eye exhibits no discharge. No scleral icterus.  Neck: Normal range of motion. Neck supple.  Cardiovascular: Normal rate, regular rhythm and normal heart sounds.   Pulmonary/Chest: Effort normal and breath sounds normal. No respiratory distress. She has no wheezes.  Abdominal: Soft. Bowel sounds are normal. She exhibits no distension and no mass. There is no tenderness. There is no rebound.  (+)Colostomy  Musculoskeletal: Normal range of motion. She  exhibits no edema or deformity.  Lymphadenopathy:    She has no cervical adenopathy.  Neurological: She is alert and oriented to person, place, and time. No cranial nerve deficit. Gait normal.  Skin: Skin is warm and dry. No erythema.  Psychiatric: Affect and judgment normal.      LABORATORY DATA: I have personally reviewed the data as listed: CBC Latest Ref Rng & Units 10/01/2017 09/16/2017 09/02/2017  WBC 3.6 - 11.0 K/uL 13.3(H) 9.7 9.8  Hemoglobin 12.0 - 16.0 g/dL 14.5 14.0 13.7  Hematocrit 35.0 - 47.0 % 42.1 42.4 40.4  Platelets 150 - 440 K/uL 378 293 259   CMP Latest Ref Rng & Units 10/01/2017 09/16/2017 09/02/2017  Glucose 65 - 99 mg/dL 110(H) 84 94  BUN 6 - 20 mg/dL '12 11 9  ' Creatinine 0.44 - 1.00 mg/dL 0.83 0.65 0.70  Sodium 135 - 145 mmol/L 134(L) 139 133(L)  Potassium 3.5 - 5.1 mmol/L 4.1 4.4 3.8  Chloride 101 - 111 mmol/L 101 105 99(L)  CO2 22 - 32 mmol/L '24 25 27  ' Calcium 8.9 - 10.3 mg/dL 8.9 8.8(L) 8.8(L)  Total Protein 6.5 - 8.1 g/dL 7.0 6.8 6.9  Total Bilirubin 0.3 - 1.2 mg/dL 0.4 0.4 0.4  Alkaline Phos 38 - 126 U/L 83 73 64  AST 15 - 41 U/L '20 22 21  ' ALT 14 - 54 U/L 12(L) 12(L) 13(L)    RADIOGRAPHIC STUDIES: I have personally reviewed the radiological images as listed and agree with the findings in the report 04/08/2017 CT abdomen pelvis w contrast IMPRESSION: 1. Partial obstruction at the level of the transverse colon, likely secondary to carcinoma. 2. Ileocolic mesenteric adenopathy, suspicious for nodal metastasis. 3.  Aortic Atherosclerosis (ICD10-I70.0). 4. Bilateral adrenal adenomas. 04/21/2017 CT abdomen pelvis with contrast. IMPRESSION: Postsurgical changes consistent  with the given clinical history. A small amount of fluid in a year is noted surrounding the right lower quadrant ostomy likely felt to be postoperative in nature. No definitive abscess is seen. The patient's palpable abnormality likely corresponds to the small fluid collection.  PATHOLOGY DIAGNOSIS:  A. COLON MASS, TRANSVERSE; TRANSVERSE COLECTOMY:  - INVASIVE ADENOCARCINOMA, MODERATELY DIFFERENTIATED.  - THREE OF SIXTEEN LYMPH NODES INVOLVED BY METASTASIS (3/16).  - LYMPHOVASCULAR AND PERINEURAL INVASION PRESENT.  - SEE SUMMARY BELOW.  Surgical Pathology Cancer Case Summary  COLON AND RECTUM:  Procedure: transverse colectomy  Tumor Site:  transverse colon  Tumor Size: Greatest dimension: 2.3 cm  Macroscopic Tumor Perforation: not specified  Histologic Type: adenocarcinoma  Histologic Grade: G2, moderately differentiated  Tumor Extension: tumor invades the visceral peritoneum  Margins: all margins are uninvolved by invasive carcinoma, high-grade dysplasia, intramucosal adenocarcinoma, and adenoma  Treatment Effect: no known presurgical therapy  Lymphovascular Invasion: present  Perineural Invasion: present  Tumor Deposits: not identified  Regional Lymph Nodes: # examined: 16  # involved: 3  Pathologic Stage Classification (pTNM, AJCC 8th Edition): pT4a pN1b   ADDENDUM:   MICROSATELLITE INSTABILITY IMMUNOHISTOCHEMISTRY  MISMATCH REPAIR PROTEINS:   MLH1: Intact nuclear expression  MSH2: Intact nuclear expression  MSH6: Intact nuclear expression  PMS2: Intact nuclear expression   Interpretation: No loss of nuclear expression of mismatch repair  proteins: Low probability of MSI-H.   Addendum: Genetic test INVITAE came back negative. No pathogenetic sequence variants or deletions/duplications identified. Results was scanned to [employer] (a copy of the report was provided to patient and her daughter)  ASSESSMENT/PLAN 72yo female who has MSI  stable stage IIIB Colon Cancer  starting adjuvant 5-FU #  baseline CEA is 2.1  Cancer Staging Malignant neoplasm of transverse colon [facility]) Staging form: Colon and Rectum, AJCC 8th Edition - Clinical stage from 04/15/2017: Stage IIIB (cT4a, cN1b, cM0) - Signed by Earlie Server, MD on 04/26/2017  1. Malignant neoplasm of transverse colon ([place])   2. Monocytosis   3. Encounter for antineoplastic chemotherapy   4. Neutrophilia   5. Viral upper respiratory tract infection    #We will delayed cycle 11 5-FU for week due to upper respiratory infection. Advise patient to stay well hydrated.  # T4 disease,will need RT after completion of chemo. refer to RAD ONC Dr. Baruch Gouty in 2 weeks. # Persistent neutrophilia and monocytosis, reviewed patient's previous labs, she has long-standing monocytosis with intermittent neutrophilia.  BCR ABL. fish of peripheral blood came back negative. Flow cytometry showed absolute monocytosis with phenotypic aberrancy. Will continue close monitor and consider bone marrow biopsy after she finished chemotherapy and radiation treatment.  . # Chronic hypomagnesia: resolved. Magnesium at 2.0. Stable. # Grade 2 neuropathy: Pre-existing peripheral neuropathy, stable. Continue vitamin B6..  # mucositis: Resolved. # Dizziness, no any further dizziness episodes.   Follow up in  1 week on day 1 of cycle 11. Patient knows to call if any questions or concerns.  Earlie Server, MD, PhD Hematology Oncology [facility] at [facility] Pager- [phone] 10/15/17

## 2017-10-15 ENCOUNTER — Inpatient Hospital Stay: Payer: Medicare HMO

## 2017-10-15 ENCOUNTER — Telehealth: Payer: Self-pay | Admitting: *Deleted

## 2017-10-15 ENCOUNTER — Encounter: Payer: Self-pay | Admitting: General Surgery

## 2017-10-15 ENCOUNTER — Ambulatory Visit (INDEPENDENT_AMBULATORY_CARE_PROVIDER_SITE_OTHER): Payer: Medicare HMO | Admitting: General Surgery

## 2017-10-15 ENCOUNTER — Telehealth: Payer: Self-pay

## 2017-10-15 ENCOUNTER — Inpatient Hospital Stay (HOSPITAL_BASED_OUTPATIENT_CLINIC_OR_DEPARTMENT_OTHER): Payer: Medicare HMO | Admitting: Oncology

## 2017-10-15 ENCOUNTER — Encounter: Payer: Self-pay | Admitting: Oncology

## 2017-10-15 VITALS — BP 114/66 | HR 82 | Temp 99.1°F | Resp 18 | Wt 156.0 lb

## 2017-10-15 VITALS — BP 150/85 | HR 89 | Temp 98.2°F | Ht 60.0 in | Wt 154.0 lb

## 2017-10-15 DIAGNOSIS — D720 Genetic anomalies of leukocytes: Secondary | ICD-10-CM | POA: Diagnosis not present

## 2017-10-15 DIAGNOSIS — D72821 Monocytosis (symptomatic): Secondary | ICD-10-CM

## 2017-10-15 DIAGNOSIS — D729 Disorder of white blood cells, unspecified: Secondary | ICD-10-CM | POA: Diagnosis not present

## 2017-10-15 DIAGNOSIS — J069 Acute upper respiratory infection, unspecified: Secondary | ICD-10-CM

## 2017-10-15 DIAGNOSIS — C184 Malignant neoplasm of transverse colon: Secondary | ICD-10-CM

## 2017-10-15 DIAGNOSIS — M25569 Pain in unspecified knee: Secondary | ICD-10-CM | POA: Diagnosis not present

## 2017-10-15 DIAGNOSIS — M549 Dorsalgia, unspecified: Secondary | ICD-10-CM | POA: Diagnosis not present

## 2017-10-15 DIAGNOSIS — Z5111 Encounter for antineoplastic chemotherapy: Secondary | ICD-10-CM | POA: Diagnosis not present

## 2017-10-15 DIAGNOSIS — G629 Polyneuropathy, unspecified: Secondary | ICD-10-CM | POA: Diagnosis not present

## 2017-10-15 LAB — COMPREHENSIVE METABOLIC PANEL
ALBUMIN: 3.4 g/dL — AB (ref 3.5–5.0)
ALK PHOS: 71 U/L (ref 38–126)
ALT: 13 U/L — ABNORMAL LOW (ref 14–54)
AST: 19 U/L (ref 15–41)
Anion gap: 8 (ref 5–15)
BILIRUBIN TOTAL: 0.4 mg/dL (ref 0.3–1.2)
BUN: 15 mg/dL (ref 6–20)
CALCIUM: 8.7 mg/dL — AB (ref 8.9–10.3)
CO2: 24 mmol/L (ref 22–32)
CREATININE: 0.71 mg/dL (ref 0.44–1.00)
Chloride: 103 mmol/L (ref 101–111)
GFR calc Af Amer: >60 mL/min (ref >60)
GFR calc non Af Amer: >60 mL/min (ref >60)
GLUCOSE: 153 mg/dL — AB (ref 65–99)
Potassium: 3.7 mmol/L (ref 3.5–5.1)
SODIUM: 135 mmol/L (ref 135–145)
TOTAL PROTEIN: 6.8 g/dL (ref 6.5–8.1)

## 2017-10-15 LAB — CBC WITH DIFFERENTIAL/PLATELET
BASOS PCT: 1 %
Basophils Absolute: 0.1 10*3/uL (ref 0–0.1)
EOS ABS: 0.2 10*3/uL (ref 0–0.7)
Eosinophils Relative: 2 %
HEMATOCRIT: 39.5 % (ref 35.0–47.0)
Hemoglobin: 13.1 g/dL (ref 12.0–16.0)
Lymphocytes Relative: 21 %
Lymphs Abs: 2.5 10*3/uL (ref 1.0–3.6)
MCH: 33.9 pg (ref 26.0–34.0)
MCHC: 33.2 g/dL (ref 32.0–36.0)
MCV: 102.3 fL — ABNORMAL HIGH (ref 80.0–100.0)
MONO ABS: 1.6 10*3/uL — AB (ref 0.2–0.9)
MONOS PCT: 13 %
Neutro Abs: 7.5 10*3/uL — ABNORMAL HIGH (ref 1.4–6.5)
Neutrophils Relative %: 63 %
PLATELETS: 314 10*3/uL (ref 150–440)
RBC: 3.86 MIL/uL (ref 3.80–5.20)
RDW: 14.7 % — AB (ref 11.5–14.5)
WBC: 11.9 10*3/uL — ABNORMAL HIGH (ref 3.6–11.0)

## 2017-10-15 LAB — MAGNESIUM: MAGNESIUM: 1.8 mg/dL (ref 1.7–2.4)

## 2017-10-15 MED ORDER — HEPARIN SOD (PORK) LOCK FLUSH 100 UNIT/ML IV SOLN
500.0000 [IU] | Freq: Once | INTRAVENOUS | Status: AC
  Administered 2017-10-15: 500 [IU] via INTRAVENOUS
  Filled 2017-10-15: qty 5

## 2017-10-15 NOTE — Patient Instructions (Signed)
I will get in touch with Kimberly [name] and let her know you are having the issues with your Colostomy.  Please see your follow up appointment listed below.

## 2017-10-15 NOTE — Progress Notes (Signed)
Outpatient Surgical Follow Up  10/15/2017  Kimberly [name] is an 72 y.o. female.   Chief Complaint  Patient presents with  . Follow-up    Discuss colostomy reversal    HPI: 72 year old female returns to clinic for follow-up from her transverse colectomy that was performed for an advanced stage colon cancer.  Patient reports having a bulge at the area of her colostomy in the right side of her abdomen.  It is functioning well.  She is eating well but having some nausea from her chemotherapy.  She reports that she has a chemotherapy treatment today and 1 additional one at the end of the month prior to completing her adjuvant chemotherapy.  She denies any fevers, chills, chest pain, shortness of breath, vomiting.  She is having some discomfort at the site of her ostomy but states it is tolerable.  Past Medical History:  Diagnosis Date  . Breast cancer ([place])   . Breast cancer, left (HCC)    Lumpectomy and rad tx's.  . Chronic back pain   . Chronic knee pain   . Colon cancer ([place])   . Degenerative disc disease, lumbar 04/2013  . Dyspnea   . Family history of adverse reaction to anesthesia    son arrested after anesthesia  . GERD (gastroesophageal reflux disease)   . Hyperlipidemia   . Iron deficiency anemia 05/27/2017  . Neuropathy   . Osteoarthritis    of knee  . Osteoporosis   . Tobacco abuse   . Vitamin D deficiency     Past Surgical History:  Procedure Laterality Date  . APPENDECTOMY  1965  . BREAST EXCISIONAL BIOPSY Left 08/01/2003   lumpectomy rad 11/04-2/28/2005  . CESAREAN SECTION     x3  . COLON SURGERY    . COLONOSCOPY W/ POLYPECTOMY    . ESOPHAGOGASTRODUODENOSCOPY (EGD) WITH PROPOFOL N/A 04/04/2017   Procedure: ESOPHAGOGASTRODUODENOSCOPY (EGD) WITH PROPOFOL;  Surgeon: [name], MD;  Location: [place];  Service: Endoscopy;  Laterality: N/A;  . HIP FRACTURE SURGERY Left 01/25/2012  . LAPAROTOMY N/A 04/15/2017   Procedure: EXPLORATORY LAPAROTOMY;  Surgeon:  Clayburn Pert, MD;  Location: [facility];  Service: General;  Laterality: N/A;  . NECK SURGERY  12/2011  . PORTACATH PLACEMENT Right 05/21/2017   Procedure: INSERTION PORT-A-CATH;  Surgeon: Clayburn Pert, MD;  Location: [facility];  Service: General;  Laterality: Right;  . WRIST FRACTURE SURGERY      Family History  Problem Relation Age of Onset  . Diabetes Mother        type 2  . Coronary artery disease Mother   . Deep vein thrombosis Mother   . Colon cancer Father   . Asthma Brother   . Diabetes Brother        type 2    Social History:  reports that she quit smoking about 4 months ago. Her smoking use included cigarettes. She has a 13.75 pack-year smoking history. she has never used smokeless tobacco. She reports that she drinks alcohol. She reports that she does not use drugs.  Allergies:  Allergies  Allergen Reactions  . Iodine Other (See Comments)    Topical iodine she states "if you put it in an open sore it will cause a eating ulcer."  . Other Nausea And Vomiting    Antihystamines  . Sinus Formula [Cholestatin] Nausea Only    Medications reviewed.    ROS A multipoint review of systems was completed, all pertinent positives and negatives are documented within the HPI  and the remainder are negative   BP (!) 150/85   Pulse 89   Temp 98.2 F (36.8 C) (Oral)   Ht 5' (1.524 m)   Wt 69.9 kg (154 lb)   BMI 30.08 kg/m   Physical Exam General: No acute distress Neck: Supple nontender Chest: Clear to auscultation Heart: Regular rate and rhythm Abdomen: Soft, nontender, nondistended.  Well-healed midline incision.  Mucous fistula in left upper quadrant with healthy-appearing pink mucosa.  Right upper quadrant end colostomy present that is pink, patent, productive of gas and stool.  There is an obvious bulge around this consistent with a parastomal hernia. Extremities: Moves all extremities well.    No results found for this or any previous visit (from the past 48  hour(s)). No results found.  Assessment/Plan:  1. Malignant neoplasm of transverse colon [facility]) 72 year old female status post transverse colectomy for advanced stage colon cancer.  Has almost completed her chemotherapy.  They are discussing radiation therapy.  Discussed with the patient and her daughter that if she is to undergo radiation therapy that it would delay her ostomy reversal.  If she is not to undergo radiation therapy then her ostomy could be reversed a few weeks after completing chemotherapy.  They voiced understanding and will follow-up in clinic next month after she completes her chemotherapy for additional follow-up.  Discussed the signs of bowel obstruction secondary to parastomal hernia.  Should she develop these she is to follow-up immediately.  Otherwise discussed that the parastomal hernia would be addressed and repaired at the time of her ostomy reversal.  A total of 15 minutes was used on this encounter with greater than 50% of it used for counseling or coordination of care.   Clayburn Pert, MD FACS General Surgeon  10/15/2017,9:16 AM

## 2017-10-15 NOTE — Telephone Encounter (Signed)
Spoke with Santiago Glad sanders at this time. Patient name , DOB, and mobile number provided. Santiago Glad stated she would call patient today.  Left a message on patient's VM letting her know that I had spoken with Santiago Glad and to expect a call from her.

## 2017-10-15 NOTE — Telephone Encounter (Signed)
lab/MD/5FU on 1/22 per 10/15/17 los.  Ok per [employer]' to put patient in am @ 10:00 Due to MD schedule LC  Pump D/c in 48 per 10/15/17 los. appt scheduled for 11:45

## 2017-10-20 ENCOUNTER — Telehealth: Payer: Self-pay

## 2017-10-20 NOTE — Telephone Encounter (Signed)
Spoke with patient at this time. She stated she was asked to be in the office at 10:00 am per Santiago Glad.   Patient came and waited 10 minutes or so and left.  I explained to patient that I did not know she was in the lobby and I apologize that Santiago Glad did not show.  I left a message for Santiago Glad to call as well as sent an email so that we can arrange an appointment.   Called patient back to let her know I have sent email and left a message for karen to call as soon as possible.

## 2017-10-20 NOTE — Progress Notes (Signed)
[facility] Follow up  Visit:  Patient Care Team: Birdie Sons, MD as PCP - General (Family Medicine)  PURPOSE OF VISIT: Adjuvant chemotherapy for colon cancer  HISTORY OF PRESENTING ILLNESS: Kimberly [name] 72 y.o. female with PMH listed as below presents for follow up for the management of Stage IIIB Colon Cancer Patient reports a remote history of breast cancer s/p lumpectomy and radiation treatments. Patient had been having abdominal pain, weight loss and bloating.  CT scan showed partial obstruction at the level of transverse colon and ileocolic mesenteric adenopathy. She was prepared for a colonoscopy on 04/15/2017. She started to have worsened abdominal pain, nausea vomiting, unable to maintain oral intake and was sent to ED. She was found to have bowel obstruction and underwent  exploratory laparotomy with transverse colectomy, with end colostomy and mucous fistula formation for obstructing colon lesion. Differential diagnosis prior to surgery was metastatic breast cancer in colon vs colon cancer. The patient did have a colonoscopy 2 years ago without the mass being present at that time but did have 2 small polyps in the transverse colon that were removed.  04/15/2017 Patient underwent transverse colon resection.  Pathology showed T4aN1 moderate differentiated adenocarcinoma, negative margin, 3/16 lymph nodes involved with cancer.  04/21/2017 She was seen in the ER earlier this week due to colostomy wound infection lateral to her right-sided end colostomy and to the midsection of her midline incision. She was started on antibiotics for cellulitis   TREATMENT: adjuvant FOLFOX, Oxaliplatin was discontinued after 3 cycles due to worsening of pre-existing neuropathy.   INTERVAL HISTORY Patient presents for evaluation prior to her cycle 11 5-FU adjuvant chemotherapy. Cycle 11 was delayed for week as patient most likely had an upper respiratory infection last week. She met  Ostomy nurse today to address ostomy dressing issue.  Patient reports feeling better today. Energy level is also better. Her chronic neuropathy appears stable. She tolerated last chemotherapy well. Appetite is fair.  Denies any nausea vomiting, fever or chills.  . Review of Systems  Constitutional: Positive for fatigue. Negative for chills, diaphoresis, fever and unexpected weight change.  HENT:   Negative for hearing loss and nosebleeds.   Respiratory: Negative for chest tightness and shortness of breath.   Cardiovascular: Negative for chest pain.  Gastrointestinal: Negative for abdominal distention, constipation, diarrhea and nausea.  Endocrine: Negative for hot flashes.  Genitourinary: Negative for difficulty urinating, dysuria and frequency.   Musculoskeletal: Negative for arthralgias, gait problem and myalgias.  Skin: Negative for rash and wound.  Neurological: Negative for dizziness, gait problem and headaches.       Chronic numbness and tingling of fingertips and toes.  Hematological: Negative for adenopathy. Does not bruise/bleed easily.  Psychiatric/Behavioral: Negative for confusion, decreased concentration and depression. The patient is not nervous/anxious.       MEDICAL HISTORY: Past Medical History:  Diagnosis Date  . Breast cancer ([place])   . Breast cancer, left ([facility])    Lumpectomy and rad tx's.  . Chronic back pain   . Chronic knee pain   . Colon cancer ([place])   . Degenerative disc disease, lumbar 04/2013  . Dyspnea   . Family history of adverse reaction to anesthesia    son arrested after anesthesia  . GERD (gastroesophageal reflux disease)   . Hyperlipidemia   . Iron deficiency anemia 05/27/2017  . Neuropathy   . Osteoarthritis    of knee  . Osteoporosis   . Tobacco abuse   .  Vitamin D deficiency     SURGICAL HISTORY: Past Surgical History:  Procedure Laterality Date  . APPENDECTOMY  1965  . BREAST EXCISIONAL BIOPSY Left 08/01/2003   lumpectomy rad  11/04-2/28/2005  . CESAREAN SECTION     x3  . COLON SURGERY    . COLONOSCOPY W/ POLYPECTOMY    . ESOPHAGOGASTRODUODENOSCOPY (EGD) WITH PROPOFOL N/A 04/04/2017   Procedure: ESOPHAGOGASTRODUODENOSCOPY (EGD) WITH PROPOFOL;  Surgeon: [name], MD;  Location: [facility];  Service: Endoscopy;  Laterality: N/A;  . HIP FRACTURE SURGERY Left 01/25/2012  . LAPAROTOMY N/A 04/15/2017   Procedure: EXPLORATORY LAPAROTOMY;  Surgeon: Clayburn Pert, MD;  Location: [facility];  Service: General;  Laterality: N/A;  . NECK SURGERY  12/2011  . PORTACATH PLACEMENT Right 05/21/2017   Procedure: INSERTION PORT-A-CATH;  Surgeon: Clayburn Pert, MD;  Location: [facility];  Service: General;  Laterality: Right;  . WRIST FRACTURE SURGERY      SOCIAL HISTORY: Social History   Socioeconomic History  . Marital status: Divorced    Spouse name: Not on file  . Number of children: Not on file  . Years of education: Not on file  . Highest education level: Not on file  Social Needs  . Financial resource strain: Not on file  . Food insecurity - worry: Not on file  . Food insecurity - inability: Not on file  . Transportation needs - medical: Not on file  . Transportation needs - non-medical: Not on file  Occupational History  . Not on file  Tobacco Use  . Smoking status: Former Smoker    Packs/day: 0.25    Years: 55.00    Pack years: 13.75    Types: Cigarettes    Last attempt to quit: 05/21/2017    Years since quitting: 0.4  . Smokeless tobacco: Never Used  . Tobacco comment: started age 64 1/3 to 1 ppd  Substance and Sexual Activity  . Alcohol use: Yes    Alcohol/week: 0.0 oz    Comment: occasionally drinks beer  . Drug use: No  . Sexual activity: No  Other Topics Concern  . Not on file  Social History Narrative  . Not on file    FAMILY HISTORY Family History  Problem Relation Age of Onset  . Diabetes Mother        type 2  . Coronary artery disease Mother   . Deep vein thrombosis  Mother   . Colon cancer Father   . Asthma Brother   . Diabetes Brother        type 2    ALLERGIES:  is allergic to iodine; other; and sinus formula [cholestatin].  MEDICATIONS:  Current Outpatient Medications  Medication Sig Dispense Refill  . budesonide-formoterol (SYMBICORT) 80-4.5 MCG/ACT inhaler Inhale 2 puffs into the lungs 2 (two) times daily.    . Calcium Citrate-Vitamin D (CITRACAL/VITAMIN D PO) Take 1 tablet by mouth daily.     . chlorhexidine (PERIDEX) 0.12 % solution USE AS DIRECTED 15 MLS IN THE MOUTH OR THROAT 2 (TWO) TIMES DAILY. SWISH AND SPIT 473 mL 0  . Cyanocobalamin (VITAMIN B 12 PO)     . feeding supplement (BOOST / RESOURCE BREEZE) LIQD Take 1 Container by mouth 3 (three) times daily between meals. 30 Container 0  . gabapentin (NEURONTIN) 400 MG capsule TAKE 1 CAPSULE TWICE DAILY 180 capsule 4  . HYDROcodone-acetaminophen (NORCO) 7.5-325 MG tablet Take 1-2 tablets by mouth every 6 (six) hours as needed for moderate pain. 120 tablet 0  .  ibuprofen (ADVIL,MOTRIN) 200 MG tablet Take 400 mg by mouth daily as needed for headache or moderate pain.    Marland Kitchen lidocaine-prilocaine (EMLA) cream Apply to affected area once 30 g 3  . LORazepam (ATIVAN) 0.5 MG tablet Take 1 tablet (0.5 mg total) by mouth every 6 (six) hours as needed (Nausea or vomiting). 30 tablet 0  . magnesium chloride (SLOW-MAG) 64 MG TBEC SR tablet Take 1 tablet (64 mg total) by mouth daily. 30 tablet 3  . meloxicam (MOBIC) 15 MG tablet TAKE 1 TABLET EVERY DAY AS NEEDED 90 tablet 4  . omeprazole (PRILOSEC) 20 MG capsule TAKE 1 CAPSULE EVERY DAY 90 capsule 4  . ondansetron (ZOFRAN) 8 MG tablet Take 1 tablet (8 mg total) by mouth 2 (two) times daily as needed for refractory nausea / vomiting. Start on day 3 after chemotherapy. 30 tablet 1  . pravastatin (PRAVACHOL) 40 MG tablet TAKE 1 TABLET (40 MG TOTAL) BY MOUTH DAILY. 90 tablet 4  . Probiotic Product (PROBIOTIC DAILY PO) Take 1 capsule by mouth daily.     .  prochlorperazine (COMPAZINE) 10 MG tablet Take 1 tablet (10 mg total) by mouth every 6 (six) hours as needed (Nausea or vomiting). 30 tablet 1  . pyridOXINE (VITAMIN B-6) 50 MG tablet Take 1 tablet (50 mg total) by mouth daily. 30 tablet 3  . simethicone (MYLICON) 80 MG chewable tablet Chew 2 tablets (160 mg total) by mouth every 6 (six) hours as needed for flatulence. 120 tablet 0  . Triamcinolone Acetonide (NASACORT ALLERGY 24HR NA) Place 1 spray into the nose daily as needed (allergies).     No current facility-administered medications for this visit.    Facility-Administered Medications Ordered in Other Visits  Medication Dose Route Frequency Provider Last Rate Last Dose  . 0.9 %  sodium chloride infusion   Intravenous Continuous Earlie Server, MD   Stopped at 07/22/17 1150  . heparin lock flush 100 unit/mL  500 Units Intravenous Once Earlie Server, MD      . heparin lock flush 100 unit/mL  500 Units Intravenous Once Earlie Server, MD      . heparin lock flush 100 unit/mL  500 Units Intravenous Once Earlie Server, MD      . sodium chloride flush (NS) 0.9 % injection 10 mL  10 mL Intracatheter PRN Earlie Server, MD      . sodium chloride flush (NS) 0.9 % injection 10 mL  10 mL Intravenous PRN Earlie Server, MD   10 mL at 05/29/17 1347    PHYSICAL EXAMINATION:  ECOG PERFORMANCE STATUS: 0 - Asymptomatic  Vitals:   10/21/17 0930  BP: (!) 147/79  Pulse: 70  Resp: 18  Temp: 97.8 F (36.6 C)   Filed Weights   10/21/17 0928  Weight: 155 lb 9.6 oz (70.6 kg)    Physical Exam  Constitutional: She is oriented to person, place, and time and well-developed, well-nourished, and in no distress. No distress.  HENT:  Head: Normocephalic and atraumatic.  Mouth/Throat: Oropharynx is clear and moist. No oropharyngeal exudate.  Eyes: Conjunctivae and EOM are normal. Pupils are equal, round, and reactive to light. Left eye exhibits no discharge. No scleral icterus.  Neck: Normal range of motion. Neck supple.   Cardiovascular: Normal rate, regular rhythm and normal heart sounds.  Pulmonary/Chest: Effort normal and breath sounds normal. No respiratory distress. She has no wheezes.  Abdominal: Soft. Bowel sounds are normal. She exhibits no distension and no mass. There is no tenderness.  There is no rebound.  (+)Colostomy  Musculoskeletal: Normal range of motion. She exhibits no edema or deformity.  Lymphadenopathy:    She has no cervical adenopathy.  Neurological: She is alert and oriented to person, place, and time. No cranial nerve deficit. Gait normal.  Skin: Skin is warm and dry. No erythema.  Psychiatric: Affect and judgment normal.      LABORATORY DATA: I have personally reviewed the data as listed: CBC Latest Ref Rng & Units 10/15/2017 10/01/2017 09/16/2017  WBC 3.6 - 11.0 K/uL 11.9(H) 13.3(H) 9.7  Hemoglobin 12.0 - 16.0 g/dL 13.1 14.5 14.0  Hematocrit 35.0 - 47.0 % 39.5 42.1 42.4  Platelets 150 - 440 K/uL 314 378 293   CMP Latest Ref Rng & Units 10/15/2017 10/01/2017 09/16/2017  Glucose 65 - 99 mg/dL 153(H) 110(H) 84  BUN 6 - 20 mg/dL _0 Creatinine 0.44 - 1.00 mg/dL 0.71 0.83 0.65  Sodium 135 - 145 mmol/L 135 134(L) 139  Potassium 3.5 - 5.1 mmol/L 3.7 4.1 4.4  Chloride 101 - 111 mmol/L 103 101 105  CO2 22 - 32 mmol/L _1 Calcium 8.9 - 10.3 mg/dL 8.7(L) 8.9 8.8(L)  Total Protein 6.5 - 8.1 g/dL 6.8 7.0 6.8  Total Bilirubin 0.3 - 1.2 mg/dL 0.4 0.4 0.4  Alkaline Phos 38 - 126 U/L 71 83 73  AST 15 - 41 U/L _2 ALT 14 - 54 U/L 13(L) 12(L) 12(L)    RADIOGRAPHIC STUDIES: I have personally reviewed the radiological images as listed and agree with the findings in the report 04/08/2017 CT abdomen pelvis w contrast IMPRESSION: 1. Partial obstruction at the level of the transverse colon, likely secondary to carcinoma. 2. Ileocolic mesenteric adenopathy, suspicious for nodal metastasis. 3.  Aortic Atherosclerosis (ICD10-I70.0). 4. Bilateral adrenal adenomas. 04/21/2017 CT  abdomen pelvis with contrast. IMPRESSION: Postsurgical changes consistent with the given clinical history. A small amount of fluid in a year is noted surrounding the right lower quadrant ostomy likely felt to be postoperative in nature. No definitive abscess is seen. The patient's palpable abnormality likely corresponds to the small fluid collection.  PATHOLOGY DIAGNOSIS:  A. COLON MASS, TRANSVERSE; TRANSVERSE COLECTOMY:  - INVASIVE ADENOCARCINOMA, MODERATELY DIFFERENTIATED.  - THREE OF SIXTEEN LYMPH NODES INVOLVED BY METASTASIS (3/16).  - LYMPHOVASCULAR AND PERINEURAL INVASION PRESENT.  - SEE SUMMARY BELOW.  Surgical Pathology Cancer Case Summary  COLON AND RECTUM:  Procedure: transverse colectomy  Tumor Site:  transverse colon  Tumor Size: Greatest dimension: 2.3 cm  Macroscopic Tumor Perforation: not specified  Histologic Type: adenocarcinoma  Histologic Grade: G2, moderately differentiated  Tumor Extension: tumor invades the visceral peritoneum  Margins: all margins are uninvolved by invasive carcinoma, high-grade dysplasia, intramucosal adenocarcinoma, and adenoma  Treatment Effect: no known presurgical therapy  Lymphovascular Invasion: present  Perineural Invasion: present  Tumor Deposits: not identified  Regional Lymph Nodes: # examined: 16  # involved: 3  Pathologic Stage Classification (pTNM, AJCC 8th Edition): pT4a pN1b   ADDENDUM:   MICROSATELLITE INSTABILITY IMMUNOHISTOCHEMISTRY  MISMATCH REPAIR PROTEINS:   MLH1: Intact nuclear expression  MSH2: Intact nuclear expression  MSH6: Intact nuclear expression  PMS2: Intact nuclear expression   Interpretation: No loss of nuclear expression of mismatch repair  proteins: Low probability of MSI-H.   Addendum: Genetic test INVITAE came back negative. No pathogenetic sequence variants or deletions/duplications identified. Results was scanned to [facility] (a copy of the report was provided to patient and her  daughter)  ASSESSMENT/PLAN 72yo  female who has MSI  stable stage IIIB Colon Cancer starting adjuvant 5-FU # baseline CEA is 2.1  Cancer Staging Malignant neoplasm of transverse colon [facility]) Staging form: Colon and Rectum, AJCC 8th Edition - Clinical stage from 04/15/2017: Stage IIIB (cT4a, cN1b, cM0) - Signed by Earlie Server, MD on 04/26/2017  1. Malignant neoplasm of transverse colon ([place])   2. Encounter for antineoplastic chemotherapy   3. Neuropathy    #Proceed with cycle 11 5-FU for week due to upper respiratory infection. Advise patient to stay well hydrated.  # T4 disease,will need RT after completion of chemo. She will meet RAD ONC Dr. Baruch Gouty in 1 week.  # Persistent neutrophilia and monocytosis, reviewed patient's previous labs, she has long-standing monocytosis with intermittent neutrophilia.  BCR ABL. FISH of peripheral blood came back negative. Flow cytometry showed absolute monocytosis with phenotypic aberrancy. Will continue close monitor and plan proceed bone marrow biopsy after she finished chemotherapy and radiation treatment. # Grade 2 neuropathy: Pre-existing peripheral neuropathy, stable. Continue vitamin B6. Patient does not want to take Neurontin or Cymbalta. Other options discussed and she will let me know if she is interested. # Dizziness, no any further dizziness episodes.   Follow up in  2 week on day 1 of cycle 12  Patient knows to call if any questions or concerns.  Earlie Server, MD, PhD Hematology Oncology [facility] at [facility] Pager- [phone] 10/21/17

## 2017-10-21 ENCOUNTER — Inpatient Hospital Stay: Payer: Medicare HMO

## 2017-10-21 ENCOUNTER — Encounter: Payer: Self-pay | Admitting: Oncology

## 2017-10-21 ENCOUNTER — Inpatient Hospital Stay (HOSPITAL_BASED_OUTPATIENT_CLINIC_OR_DEPARTMENT_OTHER): Payer: Medicare HMO | Admitting: Oncology

## 2017-10-21 VITALS — BP 147/79 | HR 70 | Temp 97.8°F | Resp 18 | Ht 60.0 in | Wt 155.6 lb

## 2017-10-21 DIAGNOSIS — J069 Acute upper respiratory infection, unspecified: Secondary | ICD-10-CM | POA: Diagnosis not present

## 2017-10-21 DIAGNOSIS — D72821 Monocytosis (symptomatic): Secondary | ICD-10-CM | POA: Diagnosis not present

## 2017-10-21 DIAGNOSIS — Z5111 Encounter for antineoplastic chemotherapy: Secondary | ICD-10-CM

## 2017-10-21 DIAGNOSIS — G629 Polyneuropathy, unspecified: Secondary | ICD-10-CM | POA: Diagnosis not present

## 2017-10-21 DIAGNOSIS — C184 Malignant neoplasm of transverse colon: Secondary | ICD-10-CM

## 2017-10-21 DIAGNOSIS — M549 Dorsalgia, unspecified: Secondary | ICD-10-CM | POA: Diagnosis not present

## 2017-10-21 DIAGNOSIS — D720 Genetic anomalies of leukocytes: Secondary | ICD-10-CM | POA: Diagnosis not present

## 2017-10-21 DIAGNOSIS — D729 Disorder of white blood cells, unspecified: Secondary | ICD-10-CM | POA: Diagnosis not present

## 2017-10-21 DIAGNOSIS — M25569 Pain in unspecified knee: Secondary | ICD-10-CM | POA: Diagnosis not present

## 2017-10-21 LAB — COMPREHENSIVE METABOLIC PANEL
ALT: 16 U/L (ref 14–54)
AST: 25 U/L (ref 15–41)
Albumin: 3.5 g/dL (ref 3.5–5.0)
Alkaline Phosphatase: 76 U/L (ref 38–126)
Anion gap: 8 (ref 5–15)
BUN: 12 mg/dL (ref 6–20)
CHLORIDE: 101 mmol/L (ref 101–111)
CO2: 27 mmol/L (ref 22–32)
CREATININE: 0.73 mg/dL (ref 0.44–1.00)
Calcium: 9 mg/dL (ref 8.9–10.3)
GFR calc non Af Amer: >60 mL/min (ref >60)
Glucose, Bld: 84 mg/dL (ref 65–99)
Potassium: 4 mmol/L (ref 3.5–5.1)
SODIUM: 136 mmol/L (ref 135–145)
Total Bilirubin: 0.4 mg/dL (ref 0.3–1.2)
Total Protein: 7.1 g/dL (ref 6.5–8.1)

## 2017-10-21 LAB — CBC WITH DIFFERENTIAL/PLATELET
Basophils Absolute: 0.1 10*3/uL (ref 0–0.1)
Basophils Relative: 1 %
EOS ABS: 0.3 10*3/uL (ref 0–0.7)
Eosinophils Relative: 4 %
HCT: 40.8 % (ref 35.0–47.0)
HEMOGLOBIN: 13.9 g/dL (ref 12.0–16.0)
LYMPHS ABS: 1.8 10*3/uL (ref 1.0–3.6)
Lymphocytes Relative: 19 %
MCH: 34.7 pg — AB (ref 26.0–34.0)
MCHC: 34 g/dL (ref 32.0–36.0)
MCV: 102 fL — AB (ref 80.0–100.0)
MONOS PCT: 17 %
Monocytes Absolute: 1.6 10*3/uL — ABNORMAL HIGH (ref 0.2–0.9)
NEUTROS ABS: 5.6 10*3/uL (ref 1.4–6.5)
NEUTROS PCT: 59 %
Platelets: 305 10*3/uL (ref 150–440)
RBC: 4 MIL/uL (ref 3.80–5.20)
RDW: 14.8 % — ABNORMAL HIGH (ref 11.5–14.5)
WBC: 9.5 10*3/uL (ref 3.6–11.0)

## 2017-10-21 LAB — MAGNESIUM: Magnesium: 1.8 mg/dL (ref 1.7–2.4)

## 2017-10-21 MED ORDER — SODIUM CHLORIDE 0.9 % IV SOLN
INTRAVENOUS | Status: DC
  Administered 2017-10-21: 10:00:00 via INTRAVENOUS
  Filled 2017-10-21: qty 1000

## 2017-10-21 MED ORDER — FLUOROURACIL CHEMO INJECTION 2.5 GM/50ML
400.0000 mg/m2 | Freq: Once | INTRAVENOUS | Status: AC
  Administered 2017-10-21: 700 mg via INTRAVENOUS
  Filled 2017-10-21: qty 14

## 2017-10-21 MED ORDER — SODIUM CHLORIDE 0.9 % IV SOLN
2400.0000 mg/m2 | INTRAVENOUS | Status: DC
  Administered 2017-10-21: 4200 mg via INTRAVENOUS
  Filled 2017-10-21: qty 84

## 2017-10-21 MED ORDER — HEPARIN SOD (PORK) LOCK FLUSH 100 UNIT/ML IV SOLN: 500.0000 [IU] | Freq: Once | INTRAVENOUS | Status: DC

## 2017-10-21 MED ORDER — SODIUM CHLORIDE 0.9% FLUSH
10.0000 mL | Freq: Once | INTRAVENOUS | Status: AC
  Administered 2017-10-21: 10 mL via INTRAVENOUS
  Filled 2017-10-21: qty 10

## 2017-10-21 MED ORDER — LEUCOVORIN CALCIUM INJECTION 100 MG
20.0000 mg/m2 | Freq: Once | INTRAMUSCULAR | Status: AC
  Administered 2017-10-21: 34 mg via INTRAVENOUS
  Filled 2017-10-21: qty 1.7

## 2017-10-21 MED ORDER — LEUCOVORIN CALCIUM INJECTION 350 MG: 400.0000 mg/m2 | Freq: Once | INTRAVENOUS | Status: DC

## 2017-10-21 MED ORDER — PALONOSETRON HCL INJECTION 0.25 MG/5ML
0.2500 mg | Freq: Once | INTRAVENOUS | Status: AC
  Administered 2017-10-21: 0.25 mg via INTRAVENOUS
  Filled 2017-10-21: qty 5

## 2017-10-21 NOTE — Progress Notes (Signed)
Patient here for followup for colon cancer. She reports bilateral leg pain (knees and shins). "I have a numbness and tingling in my legs."  Pt reports that she is not taking her gabapentin due to "drug induced memory changes."  She met with the ostomy nurse this morning. She reports pain at the ostomy dressing site. She states that her ostomy is "nice and pink"

## 2017-10-23 ENCOUNTER — Inpatient Hospital Stay: Payer: Medicare HMO

## 2017-10-23 VITALS — BP 127/80 | HR 69 | Temp 98.2°F | Resp 20

## 2017-10-23 DIAGNOSIS — D720 Genetic anomalies of leukocytes: Secondary | ICD-10-CM | POA: Diagnosis not present

## 2017-10-23 DIAGNOSIS — C184 Malignant neoplasm of transverse colon: Secondary | ICD-10-CM | POA: Diagnosis not present

## 2017-10-23 DIAGNOSIS — Z5111 Encounter for antineoplastic chemotherapy: Secondary | ICD-10-CM | POA: Diagnosis not present

## 2017-10-23 DIAGNOSIS — G629 Polyneuropathy, unspecified: Secondary | ICD-10-CM | POA: Diagnosis not present

## 2017-10-23 DIAGNOSIS — M25569 Pain in unspecified knee: Secondary | ICD-10-CM | POA: Diagnosis not present

## 2017-10-23 DIAGNOSIS — J069 Acute upper respiratory infection, unspecified: Secondary | ICD-10-CM | POA: Diagnosis not present

## 2017-10-23 DIAGNOSIS — D729 Disorder of white blood cells, unspecified: Secondary | ICD-10-CM | POA: Diagnosis not present

## 2017-10-23 DIAGNOSIS — M549 Dorsalgia, unspecified: Secondary | ICD-10-CM | POA: Diagnosis not present

## 2017-10-23 DIAGNOSIS — D72821 Monocytosis (symptomatic): Secondary | ICD-10-CM | POA: Diagnosis not present

## 2017-10-23 MED ORDER — SODIUM CHLORIDE 0.9% FLUSH
10.0000 mL | INTRAVENOUS | Status: DC | PRN
  Administered 2017-10-23: 10 mL
  Filled 2017-10-23: qty 10

## 2017-10-23 MED ORDER — HEPARIN SOD (PORK) LOCK FLUSH 100 UNIT/ML IV SOLN
500.0000 [IU] | Freq: Once | INTRAVENOUS | Status: AC | PRN
  Administered 2017-10-23: 500 [IU]
  Filled 2017-10-23: qty 5

## 2017-10-25 DIAGNOSIS — C189 Malignant neoplasm of colon, unspecified: Secondary | ICD-10-CM | POA: Diagnosis not present

## 2017-10-25 DIAGNOSIS — Z933 Colostomy status: Secondary | ICD-10-CM | POA: Diagnosis not present

## 2017-10-28 DIAGNOSIS — C184 Malignant neoplasm of transverse colon: Secondary | ICD-10-CM | POA: Diagnosis not present

## 2017-10-30 ENCOUNTER — Ambulatory Visit
Admission: RE | Admit: 2017-10-30 | Discharge: 2017-10-30 | Disposition: A | Discharge status: Home / Self Care | Payer: Medicare HMO | Source: Ambulatory Visit | Attending: Radiation Oncology | Admitting: Radiation Oncology

## 2017-10-30 ENCOUNTER — Other Ambulatory Visit: Payer: Self-pay

## 2017-10-30 ENCOUNTER — Encounter: Payer: Self-pay | Admitting: Radiation Oncology

## 2017-10-30 VITALS — BP 150/89 | HR 94 | Temp 97.5°F | Ht 64.0 in | Wt 157.8 lb

## 2017-10-30 DIAGNOSIS — Z933 Colostomy status: Secondary | ICD-10-CM | POA: Diagnosis not present

## 2017-10-30 DIAGNOSIS — Z853 Personal history of malignant neoplasm of breast: Secondary | ICD-10-CM | POA: Diagnosis not present

## 2017-10-30 DIAGNOSIS — M549 Dorsalgia, unspecified: Secondary | ICD-10-CM | POA: Diagnosis not present

## 2017-10-30 DIAGNOSIS — Z9049 Acquired absence of other specified parts of digestive tract: Secondary | ICD-10-CM | POA: Diagnosis not present

## 2017-10-30 DIAGNOSIS — Z800 Family history of malignant neoplasm of digestive organs: Secondary | ICD-10-CM | POA: Insufficient documentation

## 2017-10-30 DIAGNOSIS — R0602 Shortness of breath: Secondary | ICD-10-CM | POA: Diagnosis not present

## 2017-10-30 DIAGNOSIS — Z87891 Personal history of nicotine dependence: Secondary | ICD-10-CM | POA: Diagnosis not present

## 2017-10-30 DIAGNOSIS — M5136 Other intervertebral disc degeneration, lumbar region: Secondary | ICD-10-CM | POA: Diagnosis not present

## 2017-10-30 DIAGNOSIS — C779 Secondary and unspecified malignant neoplasm of lymph node, unspecified: Secondary | ICD-10-CM | POA: Diagnosis not present

## 2017-10-30 DIAGNOSIS — E509 Vitamin A deficiency, unspecified: Secondary | ICD-10-CM | POA: Insufficient documentation

## 2017-10-30 DIAGNOSIS — G8929 Other chronic pain: Secondary | ICD-10-CM | POA: Diagnosis not present

## 2017-10-30 DIAGNOSIS — E785 Hyperlipidemia, unspecified: Secondary | ICD-10-CM | POA: Diagnosis not present

## 2017-10-30 DIAGNOSIS — G629 Polyneuropathy, unspecified: Secondary | ICD-10-CM | POA: Insufficient documentation

## 2017-10-30 DIAGNOSIS — M810 Age-related osteoporosis without current pathological fracture: Secondary | ICD-10-CM | POA: Insufficient documentation

## 2017-10-30 DIAGNOSIS — C184 Malignant neoplasm of transverse colon: Secondary | ICD-10-CM | POA: Diagnosis not present

## 2017-10-30 DIAGNOSIS — E559 Vitamin D deficiency, unspecified: Secondary | ICD-10-CM | POA: Insufficient documentation

## 2017-10-30 DIAGNOSIS — M1990 Unspecified osteoarthritis, unspecified site: Secondary | ICD-10-CM | POA: Diagnosis not present

## 2017-10-30 DIAGNOSIS — K219 Gastro-esophageal reflux disease without esophagitis: Secondary | ICD-10-CM | POA: Insufficient documentation

## 2017-10-30 DIAGNOSIS — M25569 Pain in unspecified knee: Secondary | ICD-10-CM | POA: Diagnosis not present

## 2017-10-30 NOTE — Consult Note (Signed)
NEW PATIENT EVALUATION  Name: Kimberly [name]  MRN: [id number]  Date:   10/30/2017     DOB: Jun 03, 1946   This 72 y.o. 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  REFERRING PHYSICIAN: Birdie Sons, MD  CHIEF COMPLAINT:  Chief Complaint  Patient presents with  . Cancer    Initial evaluation for radiation treatments    DIAGNOSIS: The encounter diagnosis was Malignant neoplasm of transverse colon ([place]).   PREVIOUS INVESTIGATIONS:  CT scans reviewed Pathology report reviewed Clinical notes reviewed Case presented at weekly tumor conference  HPI: Patient is a 72 year old female well-known to our department having been treated approximately 14 years ago for breast cancer status post wide local excision and adjuvant radiation therapy.  She recently presented with increasing abdominal pain weight loss and bloating.  CT scan showed a partial obstruction at the level of the transverse colon and ileocolic mesenteric adenopathy.  She underwent colonoscopy July 2018.  She was found to have a bowel obstruction underwent exploratory laparotomy and transverse colectomy with end colostomy.  Surgical pathology showed moderately differentiated adenocarcinoma with 3 of 16 lymph nodes positive for metastatic disease.  Tumor measured 2.3 cm.  There was involvement of the visceral peritoneum as well as lymphovascular and perineural invasion.  Patient went on adjuvant FOLFOX chemotherapy with discontinuation of oxalicplatinum due to increasing neuropathy.  She is scheduled to complete her 5-FU and next week and is now referred to radiation oncology for opinion.  Her case was presented at weekly tumor conference a recommendation based on the T4 stage of her disease would require adjuvant radiation.  She has been having some problems with her colostomy with skin irritation and that is  being monitored by the colostomy nurse.  She otherwise is doing fairly well although considerably tired.  PLANNED TREATMENT REGIMEN: Adjuvant radiation therapy  PAST MEDICAL HISTORY:  has a past medical history of Breast cancer ([place]), Breast cancer, left ([place]), Chronic back pain, Chronic knee pain, Colon cancer ([place]), Degenerative disc disease, lumbar (04/2013), Dyspnea, Family history of adverse reaction to anesthesia, GERD (gastroesophageal reflux disease), Hyperlipidemia, Iron deficiency anemia (05/27/2017), Neuropathy, Osteoarthritis, Osteoporosis, Tobacco abuse, and Vitamin D deficiency.    PAST SURGICAL HISTORY:  Past Surgical History:  Procedure Laterality Date  . APPENDECTOMY  1965  . BREAST EXCISIONAL BIOPSY Left 08/01/2003   lumpectomy rad 11/04-2/28/2005  . CESAREAN SECTION     x3  . COLON SURGERY    . COLONOSCOPY W/ POLYPECTOMY    . ESOPHAGOGASTRODUODENOSCOPY (EGD) WITH PROPOFOL N/A 04/04/2017   Procedure: ESOPHAGOGASTRODUODENOSCOPY (EGD) WITH PROPOFOL;  Surgeon: [name], MD;  Location: [facility];  Service: Endoscopy;  Laterality: N/A;  . HIP FRACTURE SURGERY Left 01/25/2012  . LAPAROTOMY N/A 04/15/2017   Procedure: EXPLORATORY LAPAROTOMY;  Surgeon: Clayburn Pert, MD;  Location: [facility];  Service: General;  Laterality: N/A;  . NECK SURGERY  12/2011  . PORTACATH PLACEMENT Right 05/21/2017   Procedure: INSERTION PORT-A-CATH;  Surgeon: Clayburn Pert, MD;  Location: [facility];  Service: General;  Laterality: Right;  . WRIST FRACTURE SURGERY      FAMILY HISTORY: family history includes Asthma in her brother; Colon cancer in her father; Coronary artery disease in her mother; Deep vein thrombosis in her mother; Diabetes in her brother and mother.  SOCIAL HISTORY:  reports that she quit smoking about 5 months ago. Her smoking use included cigarettes. She  has a 13.75 pack-year smoking history. she has never used smokeless tobacco. She reports that she drinks alcohol. She  reports that she does not use drugs.  ALLERGIES: Iodine; Other; and Sinus formula [cholestatin]  MEDICATIONS:  Current Outpatient Medications  Medication Sig Dispense Refill  . budesonide-formoterol (SYMBICORT) 80-4.5 MCG/ACT inhaler Inhale 2 puffs into the lungs 2 (two) times daily.    . Calcium Citrate-Vitamin D (CITRACAL/VITAMIN D PO) Take 1 tablet by mouth daily.     . chlorhexidine (PERIDEX) 0.12 % solution USE AS DIRECTED 15 MLS IN THE MOUTH OR THROAT 2 (TWO) TIMES DAILY. SWISH AND SPIT 473 mL 0  . Cyanocobalamin (VITAMIN B 12 PO)     . feeding supplement (BOOST / RESOURCE BREEZE) LIQD Take 1 Container by mouth 3 (three) times daily between meals. 30 Container 0  . gabapentin (NEURONTIN) 400 MG capsule TAKE 1 CAPSULE TWICE DAILY (Patient not taking: Reported on 10/21/2017) 180 capsule 4  . HYDROcodone-acetaminophen (NORCO) 7.5-325 MG tablet Take 1-2 tablets by mouth every 6 (six) hours as needed for moderate pain. 120 tablet 0  . ibuprofen (ADVIL,MOTRIN) 200 MG tablet Take 400 mg by mouth daily as needed for headache or moderate pain.    Marland Kitchen lidocaine-prilocaine (EMLA) cream Apply to affected area once 30 g 3  . LORazepam (ATIVAN) 0.5 MG tablet Take 1 tablet (0.5 mg total) by mouth every 6 (six) hours as needed (Nausea or vomiting). 30 tablet 0  . meloxicam (MOBIC) 15 MG tablet TAKE 1 TABLET EVERY DAY AS NEEDED 90 tablet 4  . omeprazole (PRILOSEC) 20 MG capsule TAKE 1 CAPSULE EVERY DAY 90 capsule 4  . ondansetron (ZOFRAN) 8 MG tablet Take 1 tablet (8 mg total) by mouth 2 (two) times daily as needed for refractory nausea / vomiting. Start on day 3 after chemotherapy. 30 tablet 1  . pravastatin (PRAVACHOL) 40 MG tablet TAKE 1 TABLET (40 MG TOTAL) BY MOUTH DAILY. 90 tablet 4  . Probiotic Product (PROBIOTIC DAILY PO) Take 1 capsule by mouth daily.     . prochlorperazine (COMPAZINE) 10 MG tablet Take 1 tablet (10 mg total) by mouth every 6 (six) hours as needed (Nausea or vomiting). 30 tablet 1   . pyridOXINE (VITAMIN B-6) 50 MG tablet Take 1 tablet (50 mg total) by mouth daily. 30 tablet 3  . simethicone (MYLICON) 80 MG chewable tablet Chew 2 tablets (160 mg total) by mouth every 6 (six) hours as needed for flatulence. (Patient not taking: Reported on 10/21/2017) 120 tablet 0  . Triamcinolone Acetonide (NASACORT ALLERGY 24HR NA) Place 1 spray into the nose daily as needed (allergies).     No current facility-administered medications for this encounter.    Facility-Administered Medications Ordered in Other Encounters  Medication Dose Route Frequency Provider Last Rate Last Dose  . 0.9 %  sodium chloride infusion   Intravenous Continuous Earlie Server, MD   Stopped at 07/22/17 1150  . heparin lock flush 100 unit/mL  500 Units Intravenous Once Earlie Server, MD      . heparin lock flush 100 unit/mL  500 Units Intravenous Once Earlie Server, MD      . heparin lock flush 100 unit/mL  500 Units Intravenous Once Earlie Server, MD      . sodium chloride flush (NS) 0.9 % injection 10 mL  10 mL Intracatheter PRN Earlie Server, MD      . sodium chloride flush (NS) 0.9 % injection 10 mL  10 mL Intravenous PRN Earlie Server, MD  10 mL at 05/29/17 1347    ECOG PERFORMANCE STATUS:  0 - Asymptomatic  REVIEW OF SYSTEMS: Patient denies any weight loss, fatigue, weakness, fever, chills or night sweats. Patient denies any loss of vision, blurred vision. Patient denies any ringing  of the ears or hearing loss. No irregular heartbeat. Patient denies heart murmur or history of fainting. Patient denies any chest pain or pain radiating to her upper extremities. Patient denies any shortness of breath, difficulty breathing at night, cough or hemoptysis. Patient denies any swelling in the lower legs. Patient denies any nausea vomiting, vomiting of blood, or coffee ground material in the vomitus. Patient denies any stomach pain. Patient states has had normal bowel movements no significant constipation or diarrhea. Patient denies any dysuria,  hematuria or significant nocturia. Patient denies any problems walking, swelling in the joints or loss of balance. Patient denies any skin changes, loss of hair or loss of weight. Patient denies any excessive worrying or anxiety or significant depression. Patient denies any problems with insomnia. Patient denies excessive thirst, polyuria, polydipsia. Patient denies any swollen glands, patient denies easy bruising or easy bleeding. Patient denies any recent infections, allergies or URI. Patient "s visual fields have not changed significantly in recent time.   PHYSICAL EXAM: BP (!) 150/89   Pulse 94   Temp (!) 97.5 F (36.4 C)   Ht 5\' 4"  (1.626 m)   Wt 157 lb 13.6 oz (71.6 kg)   BMI 27.09 kg/m  Well-developed female in NAD colostomy is functioning well abdomen is benign. Well-developed well-nourished patient in NAD. HEENT reveals PERLA, EOMI, discs not visualized.  Oral cavity is clear. No oral mucosal lesions are identified. Neck is clear without evidence of cervical or supraclavicular adenopathy. Lungs are clear to A&P. Cardiac examination is essentially unremarkable with regular rate and rhythm without murmur rub or thrill. Abdomen is benign with no organomegaly or masses noted. Motor sensory and DTR levels are equal and symmetric in the upper and lower extremities. Cranial nerves II through XII are grossly intact. Proprioception is intact. No peripheral adenopathy or edema is identified. No motor or sensory levels are noted. Crude visual fields are within normal range.  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  PLAN: At this time would like to go ahead with radiation therapy and adjuvant setting.  Would plan on delivering 4500 cGy over 28 fractions to the area of original tumor  involvement.  We will fuse her original CT scan with recurrence simulation CT scan for treatment planning purposes.  Risks and benefits of treatment including increased chances of diarrhea skin reaction fatigue alteration of blood counts and increased scarring in her abdomen all were discussed in detail with the patient and her daughter.  They both seem to comprehend my treatment plan well.  There will be extra effort by both professional staff as well as technical staff to coordinate and manage concurrent chemoradiation and ensuing side effects during her treatments.  I have personally set up and ordered CT simulation approximately week after completion of her chemotherapy.  I would like to take this opportunity to thank you for allowing me to participate in the care of your patient.Noreene Filbert, MD

## 2017-11-03 ENCOUNTER — Encounter: Payer: Self-pay | Admitting: Surgery

## 2017-11-03 ENCOUNTER — Ambulatory Visit (INDEPENDENT_AMBULATORY_CARE_PROVIDER_SITE_OTHER): Payer: Medicare HMO | Admitting: Surgery

## 2017-11-03 VITALS — BP 134/85 | HR 97 | Temp 98.3°F | Ht 64.0 in | Wt 156.0 lb

## 2017-11-03 DIAGNOSIS — C184 Malignant neoplasm of transverse colon: Secondary | ICD-10-CM

## 2017-11-03 NOTE — Progress Notes (Signed)
Outpatient Surgical Follow Up  11/03/2017  Kimberly [name] is an 72 y.o. female.   CC: Transverse colon cancer  HPI: This a patient with a history of a transverse colon cancer treated with resection and mucous fistula and colostomy.  She has not yet completed chemotherapy.  Dr. Adonis Huguenin was her surgeon.  She is going to finish chemotherapy this week and then start radiation therapy 1 March.  She was told that she could have her colostomy reversed 2 weeks after finishing her radiation which would be sometime in April.  Dr. Adonis Huguenin will not be at [facility] surgical during that timeframe.  In general the patient feels well has not gained a lot of weight with chemotherapy but has been staying stable.  Her ostomy is functional and she has no other problems.   Past Medical History:  Diagnosis Date  . Breast cancer ([place])   . Breast cancer, left ([facility])    Lumpectomy and rad tx's.  . Chronic back pain   . Chronic knee pain   . Colon cancer ([place])   . Degenerative disc disease, lumbar 04/2013  . Dyspnea   . Family history of adverse reaction to anesthesia    son arrested after anesthesia  . GERD (gastroesophageal reflux disease)   . Hyperlipidemia   . Iron deficiency anemia 05/27/2017  . Neuropathy   . Osteoarthritis    of knee  . Osteoporosis   . Tobacco abuse   . Vitamin D deficiency     Past Surgical History:  Procedure Laterality Date  . APPENDECTOMY  1965  . BREAST EXCISIONAL BIOPSY Left 08/01/2003   lumpectomy rad 11/04-2/28/2005  . CESAREAN SECTION     x3  . COLON SURGERY    . COLONOSCOPY W/ POLYPECTOMY    . ESOPHAGOGASTRODUODENOSCOPY (EGD) WITH PROPOFOL N/A 04/04/2017   Procedure: ESOPHAGOGASTRODUODENOSCOPY (EGD) WITH PROPOFOL;  Surgeon: [name], MD;  Location: [facility];  Service: Endoscopy;  Laterality: N/A;  . HIP FRACTURE SURGERY Left 01/25/2012  . LAPAROTOMY N/A 04/15/2017   Procedure: EXPLORATORY LAPAROTOMY;  Surgeon: Clayburn Pert, MD;  Location: [facility];   Service: General;  Laterality: N/A;  . NECK SURGERY  12/2011  . PORTACATH PLACEMENT Right 05/21/2017   Procedure: INSERTION PORT-A-CATH;  Surgeon: Clayburn Pert, MD;  Location: [facility];  Service: General;  Laterality: Right;  . WRIST FRACTURE SURGERY      Family History  Problem Relation Age of Onset  . Diabetes Mother        type 2  . Coronary artery disease Mother   . Deep vein thrombosis Mother   . Colon cancer Father   . Asthma Brother   . Diabetes Brother        type 2    Social History:  reports that she quit smoking about 5 months ago. Her smoking use included cigarettes. She has a 13.75 pack-year smoking history. she has never used smokeless tobacco. She reports that she drinks alcohol. She reports that she does not use drugs.  Allergies:  Allergies  Allergen Reactions  . Iodine Other (See Comments)    Topical iodine she states "if you put it in an open sore it will cause a eating ulcer."  . Other Nausea And Vomiting    Antihystamines  . Sinus Formula [Cholestatin] Nausea Only    Medications reviewed.   Review of Systems:   Review of Systems  Constitutional: Positive for malaise/fatigue. Negative for chills, diaphoresis, fever and weight loss.  HENT: Negative.   Eyes:  Negative.   Respiratory: Negative.   Cardiovascular: Negative.   Gastrointestinal: Negative for abdominal pain, blood in stool, constipation, diarrhea, heartburn, melena, nausea and vomiting.  Genitourinary: Negative.   Musculoskeletal: Negative.   Skin: Negative.   Neurological: Positive for weakness.  Endo/Heme/Allergies: Negative.   Psychiatric/Behavioral: Negative.      Physical Exam:  There were no vitals taken for this visit.  Physical Exam  Constitutional: She is oriented to person, place, and time and well-developed, well-nourished, and in no distress. No distress.  Elderly and somewhat weak appearing female patient  HENT:  Head: Normocephalic and atraumatic.  Eyes: Pupils  are equal, round, and reactive to light. Right eye exhibits no discharge. Left eye exhibits no discharge. No scleral icterus.  Neck: Normal range of motion.  Cardiovascular: Normal rate, regular rhythm and normal heart sounds.  Pulmonary/Chest: Effort normal and breath sounds normal. No respiratory distress. She has no wheezes. She has no rales.  Abdominal: Soft. She exhibits distension. There is no tenderness. There is no rebound and no guarding.  Left upper quadrant mucous fistula with dressing in place Right upper quadrant colostomy with belt and appliance in place and functional Midline incision well-healed no erythema no drainage Considerable distention around each of the ostomy suggestive of possible parastomal hernias in one or both locations nontender  Musculoskeletal: Normal range of motion. She exhibits edema.  Lymphadenopathy:    She has no cervical adenopathy.  Neurological: She is alert and oriented to person, place, and time.  Skin: Skin is warm and dry. No rash noted. She is not diaphoretic. No erythema.  Vitals reviewed.     No results found for this or any previous visit (from the past 48 hour(s)). No results found.  Assessment/Plan:  This is a patient with transverse colon cancer.  Dr. Adonis Huguenin had resected it and performed an end colostomy and mucous fistula.  States she has had some dark fluid from the mucous fistula but otherwise is doing well.  She has not gained any weight as she finishes her chemotherapy tomorrow.  She is then slated to start radiation therapy 1 March for approximately 4-5 weeks.  She was told that she could have her ostomy closed 2 weeks after finishing radiotherapy but in my experience the intense inflammatory response depending on the location of her radiation treatments can be so intense as to make her at high risk for leakage from an anastomosis.  She has not had any imagings performed since starting chemotherapy.  I suspect she will be due for a  PET scan but regardless I would need to see if a CAT scan shows any sign of residual tumor and or parastomal hernias prior to planning operation.  This is reviewed for she and her family member.  My plan would be to obtain a CT scan near the and/or after she finishes her radiotherapy and see her back and discuss closure at that time.  Florene Glen, MD, FACS

## 2017-11-03 NOTE — Progress Notes (Signed)
[facility] Follow up  Visit:  Patient Care Team: Birdie Sons, MD as PCP - General (Family Medicine)  PURPOSE OF VISIT: Adjuvant chemotherapy for colon cancer  HISTORY OF PRESENTING ILLNESS: Kimberly [name] 72 y.o. female with PMH listed as below presents for follow up for the management of Stage IIIB Colon Cancer Patient reports a remote history of breast cancer s/p lumpectomy and radiation treatments. Patient had been having abdominal pain, weight loss and bloating.  CT scan showed partial obstruction at the level of transverse colon and ileocolic mesenteric adenopathy. She was prepared for a colonoscopy on 04/15/2017. She started to have worsened abdominal pain, nausea vomiting, unable to maintain oral intake and was sent to ED. She was found to have bowel obstruction and underwent  exploratory laparotomy with transverse colectomy, with end colostomy and mucous fistula formation for obstructing colon lesion. Differential diagnosis prior to surgery was metastatic breast cancer in colon vs colon cancer. The patient did have a colonoscopy 2 years ago without the mass being present at that time but did have 2 small polyps in the transverse colon that were removed.  04/15/2017 Patient underwent transverse colon resection.  Pathology showed T4aN1 moderate differentiated adenocarcinoma, negative margin, 3/16 lymph nodes involved with cancer. Low probability of MSI-H.   # Genetic test INVITAE came back negative. No pathogenetic sequence variants or deletions/duplications identified. Results was scanned to [facility] (a copy of the report was provided to patient and her daughter)  # # baseline CEA is 2.1  TREATMENT: adjuvant FOLFOX, Oxaliplatin was discontinued after 3 cycles due to worsening of pre-existing neuropathy.   INTERVAL HISTORY Patient presents for evaluation prior to her cycle 12 5-FU adjuvant chemotherapy.  Reports feeling well, tolerate last chemotherapy well.  Denies  any diarrhea nausea vomiting.  Denies any fever or chills.  Appetite is fair.  She has ongoing chronic pre-existing neuropathy. Feels tired but at baseline.  She has met radiation oncology and has plan to start simulation for her upcoming radiation plan.  . Review of Systems  Constitutional: Positive for fatigue. Negative for chills, fever and unexpected weight change.  HENT:   Negative for hearing loss, lump/mass and nosebleeds.   Eyes: Negative for eye problems.  Respiratory: Negative for chest tightness, cough and shortness of breath.   Cardiovascular: Negative for chest pain and leg swelling.  Gastrointestinal: Negative for abdominal distention, abdominal pain, constipation and nausea.  Endocrine: Negative for hot flashes.  Genitourinary: Negative for difficulty urinating, dysuria and frequency.   Musculoskeletal: Negative for arthralgias, gait problem and myalgias.  Skin: Negative for rash and wound.  Neurological: Negative for dizziness, gait problem and headaches.       Chronic numbness and tingling of fingertips and toes.  Hematological: Negative for adenopathy. Does not bruise/bleed easily.  Psychiatric/Behavioral: Negative for confusion, decreased concentration and depression. The patient is not nervous/anxious.       MEDICAL HISTORY: Past Medical History:  Diagnosis Date  . Breast cancer ([place])   . Breast cancer, left ([facility])    Lumpectomy and rad tx's.  . Chronic back pain   . Chronic knee pain   . Colon cancer ([place])   . Degenerative disc disease, lumbar 04/2013  . Dyspnea   . Family history of adverse reaction to anesthesia    son arrested after anesthesia  . GERD (gastroesophageal reflux disease)   . Hyperlipidemia   . Iron deficiency anemia 05/27/2017  . Neuropathy   . Osteoarthritis    of  knee  . Osteoporosis   . Tobacco abuse   . Vitamin D deficiency     SURGICAL HISTORY: Past Surgical History:  Procedure Laterality Date  . APPENDECTOMY  1965  . BREAST  EXCISIONAL BIOPSY Left 08/01/2003   lumpectomy rad 11/04-2/28/2005  . CESAREAN SECTION     x3  . COLON SURGERY    . COLONOSCOPY W/ POLYPECTOMY    . ESOPHAGOGASTRODUODENOSCOPY (EGD) WITH PROPOFOL N/A 04/04/2017   Procedure: ESOPHAGOGASTRODUODENOSCOPY (EGD) WITH PROPOFOL;  Surgeon: [name], MD;  Location: [facility];  Service: Endoscopy;  Laterality: N/A;  . HIP FRACTURE SURGERY Left 01/25/2012  . LAPAROTOMY N/A 04/15/2017   Procedure: EXPLORATORY LAPAROTOMY;  Surgeon: Clayburn Pert, MD;  Location: [facility];  Service: General;  Laterality: N/A;  . NECK SURGERY  12/2011  . PORTACATH PLACEMENT Right 05/21/2017   Procedure: INSERTION PORT-A-CATH;  Surgeon: Clayburn Pert, MD;  Location: [facility];  Service: General;  Laterality: Right;  . WRIST FRACTURE SURGERY      SOCIAL HISTORY: Social History   Socioeconomic History  . Marital status: Divorced    Spouse name: Not on file  . Number of children: Not on file  . Years of education: Not on file  . Highest education level: Not on file  Social Needs  . Financial resource strain: Not on file  . Food insecurity - worry: Not on file  . Food insecurity - inability: Not on file  . Transportation needs - medical: Not on file  . Transportation needs - non-medical: Not on file  Occupational History  . Not on file  Tobacco Use  . Smoking status: Former Smoker    Packs/day: 0.25    Years: 55.00    Pack years: 13.75    Types: Cigarettes    Last attempt to quit: 05/21/2017    Years since quitting: 0.4  . Smokeless tobacco: Never Used  . Tobacco comment: started age 42 1/3 to 1 ppd  Substance and Sexual Activity  . Alcohol use: Yes    Alcohol/week: 0.0 oz    Comment: occasionally drinks beer  . Drug use: No  . Sexual activity: No  Other Topics Concern  . Not on file  Social History Narrative  . Not on file    FAMILY HISTORY Family History  Problem Relation Age of Onset  . Diabetes Mother        type 2  . Coronary  artery disease Mother   . Deep vein thrombosis Mother   . Colon cancer Father   . Asthma Brother   . Diabetes Brother        type 2    ALLERGIES:  is allergic to iodine; other; and sinus formula [cholestatin].  MEDICATIONS:  Current Outpatient Medications  Medication Sig Dispense Refill  . budesonide-formoterol (SYMBICORT) 80-4.5 MCG/ACT inhaler Inhale 2 puffs into the lungs 2 (two) times daily.    . Calcium Citrate-Vitamin D (CITRACAL/VITAMIN D PO) Take 1 tablet by mouth daily.     . chlorhexidine (PERIDEX) 0.12 % solution USE AS DIRECTED 15 MLS IN THE MOUTH OR THROAT 2 (TWO) TIMES DAILY. SWISH AND SPIT 473 mL 0  . Cyanocobalamin (VITAMIN B 12 PO)     . feeding supplement (BOOST / RESOURCE BREEZE) LIQD Take 1 Container by mouth 3 (three) times daily between meals. 30 Container 0  . furosemide (LASIX) 20 MG tablet     . gabapentin (NEURONTIN) 400 MG capsule TAKE 1 CAPSULE TWICE DAILY 180 capsule 4  . HYDROcodone-acetaminophen (  NORCO) 7.5-325 MG tablet Take 1-2 tablets by mouth every 6 (six) hours as needed for moderate pain. 120 tablet 0  . ibuprofen (ADVIL,MOTRIN) 200 MG tablet Take 400 mg by mouth daily as needed for headache or moderate pain.    Marland Kitchen lidocaine-prilocaine (EMLA) cream Apply to affected area once 30 g 3  . LORazepam (ATIVAN) 0.5 MG tablet Take 1 tablet (0.5 mg total) by mouth every 6 (six) hours as needed (Nausea or vomiting). 30 tablet 0  . meloxicam (MOBIC) 15 MG tablet TAKE 1 TABLET EVERY DAY AS NEEDED 90 tablet 4  . omeprazole (PRILOSEC) 20 MG capsule TAKE 1 CAPSULE EVERY DAY 90 capsule 4  . ondansetron (ZOFRAN) 8 MG tablet Take 1 tablet (8 mg total) by mouth 2 (two) times daily as needed for refractory nausea / vomiting. Start on day 3 after chemotherapy. 30 tablet 1  . pravastatin (PRAVACHOL) 40 MG tablet TAKE 1 TABLET (40 MG TOTAL) BY MOUTH DAILY. 90 tablet 4  . Probiotic Product (PROBIOTIC DAILY PO) Take 1 capsule by mouth daily.     . prochlorperazine (COMPAZINE)  10 MG tablet Take 1 tablet (10 mg total) by mouth every 6 (six) hours as needed (Nausea or vomiting). 30 tablet 1  . pyridOXINE (VITAMIN B-6) 50 MG tablet Take 1 tablet (50 mg total) by mouth daily. 30 tablet 3  . simethicone (MYLICON) 80 MG chewable tablet Chew 2 tablets (160 mg total) by mouth every 6 (six) hours as needed for flatulence. 120 tablet 0  . Triamcinolone Acetonide (NASACORT ALLERGY 24HR NA) Place 1 spray into the nose daily as needed (allergies).     No current facility-administered medications for this visit.    Facility-Administered Medications Ordered in Other Visits  Medication Dose Route Frequency Provider Last Rate Last Dose  . 0.9 %  sodium chloride infusion   Intravenous Continuous Earlie Server, MD   Stopped at 07/22/17 1150  . fluorouracil (ADRUCIL) 4,200 mg in sodium chloride 0.9 % 66 mL chemo infusion  2,400 mg/m2 (Treatment Plan Recorded) Intravenous 1 day or 1 dose Earlie Server, MD   4,200 mg at 11/04/17 1055  . heparin lock flush 100 unit/mL  500 Units Intravenous Once Earlie Server, MD      . heparin lock flush 100 unit/mL  500 Units Intravenous Once Earlie Server, MD      . heparin lock flush 100 unit/mL  500 Units Intravenous Once Earlie Server, MD      . sodium chloride flush (NS) 0.9 % injection 10 mL  10 mL Intracatheter PRN Earlie Server, MD      . sodium chloride flush (NS) 0.9 % injection 10 mL  10 mL Intravenous PRN Earlie Server, MD   10 mL at 05/29/17 1347  . sodium chloride flush (NS) 0.9 % injection 10 mL  10 mL Intracatheter PRN Earlie Server, MD   10 mL at 11/04/17 0854    PHYSICAL EXAMINATION:  ECOG PERFORMANCE STATUS: 0 - Asymptomatic  Vitals:   11/04/17 0912  BP: 115/74  Pulse: (!) 101  Temp: 98.6 F (37 C)   Filed Weights   11/04/17 0912  Weight: 154 lb 2 oz (69.9 kg)    Physical Exam  Constitutional: She is oriented to person, place, and time and well-developed, well-nourished, and in no distress. No distress.  HENT:  Head: Normocephalic and atraumatic.   Mouth/Throat: Oropharynx is clear and moist. No oropharyngeal exudate.  Eyes: Conjunctivae and EOM are normal. Pupils are equal, round, and reactive  to light. Left eye exhibits no discharge. No scleral icterus.  Neck: Normal range of motion. Neck supple.  Cardiovascular: Normal rate, regular rhythm and normal heart sounds.  No murmur heard. Pulmonary/Chest: Effort normal and breath sounds normal. No respiratory distress. She has no wheezes.  Abdominal: Soft. Bowel sounds are normal. She exhibits no distension and no mass. There is no tenderness. There is no rebound.  (+)Colostomy  Musculoskeletal: Normal range of motion. She exhibits no edema.  Lymphadenopathy:    She has no cervical adenopathy.  Neurological: She is alert and oriented to person, place, and time. No cranial nerve deficit. Gait normal.  Skin: Skin is warm and dry. No erythema.  Psychiatric: Affect and judgment normal.      LABORATORY DATA: I have personally reviewed the data as listed: CBC Latest Ref Rng & Units 10/21/2017 10/15/2017 10/01/2017  WBC 3.6 - 11.0 K/uL 9.5 11.9(H) 13.3(H)  Hemoglobin 12.0 - 16.0 g/dL 13.9 13.1 14.5  Hematocrit 35.0 - 47.0 % 40.8 39.5 42.1  Platelets 150 - 440 K/uL 305 314 378   CMP Latest Ref Rng & Units 10/21/2017 10/15/2017 10/01/2017  Glucose 65 - 99 mg/dL 84 153(H) 110(H)  BUN 6 - 20 mg/dL _0 Creatinine 0.44 - 1.00 mg/dL 0.73 0.71 0.83  Sodium 135 - 145 mmol/L 136 135 134(L)  Potassium 3.5 - 5.1 mmol/L 4.0 3.7 4.1  Chloride 101 - 111 mmol/L 101 103 101  CO2 22 - 32 mmol/L _1 Calcium 8.9 - 10.3 mg/dL 9.0 8.7(L) 8.9  Total Protein 6.5 - 8.1 g/dL 7.1 6.8 7.0  Total Bilirubin 0.3 - 1.2 mg/dL 0.4 0.4 0.4  Alkaline Phos 38 - 126 U/L 76 71 83  AST 15 - 41 U/L _2 ALT 14 - 54 U/L 16 13(L) 12(L)    RADIOGRAPHIC STUDIES: I have personally reviewed the radiological images as listed and agree with the findings in the report 04/08/2017 CT abdomen pelvis w  contrast IMPRESSION: 1. Partial obstruction at the level of the transverse colon, likely secondary to carcinoma. 2. Ileocolic mesenteric adenopathy, suspicious for nodal metastasis. 3.  Aortic Atherosclerosis (ICD10-I70.0). 4. Bilateral adrenal adenomas. 04/21/2017 CT abdomen pelvis with contrast. IMPRESSION: Postsurgical changes consistent with the given clinical history. A small amount of fluid in a year is noted surrounding the right lower quadrant ostomy likely felt to be postoperative in nature. No definitive abscess is seen. The patient's palpable abnormality likely corresponds to the small fluid collection.  PATHOLOGY DIAGNOSIS:  A. COLON MASS, TRANSVERSE; TRANSVERSE COLECTOMY:  - INVASIVE ADENOCARCINOMA, MODERATELY DIFFERENTIATED.  - THREE OF SIXTEEN LYMPH NODES INVOLVED BY METASTASIS (3/16).  - LYMPHOVASCULAR AND PERINEURAL INVASION PRESENT.  - SEE SUMMARY BELOW.  Surgical Pathology Cancer Case Summary  COLON AND RECTUM:  Procedure: transverse colectomy  Tumor Site:  transverse colon  Tumor Size: Greatest dimension: 2.3 cm  Macroscopic Tumor Perforation: not specified  Histologic Type: adenocarcinoma  Histologic Grade: G2, moderately differentiated  Tumor Extension: tumor invades the visceral peritoneum  Margins: all margins are uninvolved by invasive carcinoma, high-grade dysplasia, intramucosal adenocarcinoma, and adenoma  Treatment Effect: no known presurgical therapy  Lymphovascular Invasion: present  Perineural Invasion: present  Tumor Deposits: not identified  Regional Lymph Nodes: # examined: 16  # involved: 3  Pathologic Stage Classification (pTNM, AJCC 8th Edition): pT4a pN1b   ADDENDUM:   MICROSATELLITE INSTABILITY IMMUNOHISTOCHEMISTRY  MISMATCH REPAIR PROTEINS:   MLH1: Intact nuclear expression  MSH2: Intact nuclear expression  MSH6:  Intact nuclear expression  PMS2: Intact nuclear expression   Interpretation: No loss of nuclear expression of  mismatch repair  proteins: Low probability of MSI-H.    ASSESSMENT/PLAN 72yo female who has MSI  stable stage IIIB Colon Cancer finishing adjuvant 5-FU   Cancer Staging Malignant neoplasm of transverse colon [facility]) Staging form: Colon and Rectum, AJCC 8th Edition - Clinical stage from 04/15/2017: Stage IIIB (cT4a, cN1b, cM0) - Signed by Earlie Server, MD on 04/26/2017  1. Malignant neoplasm of transverse colon ([place])   2. Encounter for antineoplastic chemotherapy   3. Personal history of malignant neoplasm of breast    #Proceed with cycle 12 5-FU, which is her last adjuvant chemotherapy. #Follow-up with [facility] Dr. Donella Stade for adjuvant radiation due to T4 disease.  # Persistent neutrophilia and monocytosis, BCR ABL. FISH of peripheral blood came back negative. Flow cytometry showed absolute monocytosis with phenotypic aberrancy.  Plan pursue bone marrow biopsy after she finished all her chemotherapy and radiation treatment.  # Grade 2 neuropathy: Pre-existing peripheral neuropathy, stable. Continue vitamin B6.  She declined Neurontin or Cymbalta. Other options discussed and she will let me know if she is interested.    Follow up in  4 weeks. Repeat labs.   Patient knows to call if any questions or concerns.  Earlie Server, MD, PhD Hematology Oncology [facility] at [facility] Pager- [phone] 11/04/17

## 2017-11-03 NOTE — Patient Instructions (Signed)
Please call our office after you have completed your radiation treatments and we will order the CT scan and follow up appointment.

## 2017-11-04 ENCOUNTER — Other Ambulatory Visit: Payer: Self-pay

## 2017-11-04 ENCOUNTER — Encounter: Payer: Self-pay | Admitting: Oncology

## 2017-11-04 ENCOUNTER — Inpatient Hospital Stay: Payer: Medicare HMO

## 2017-11-04 ENCOUNTER — Inpatient Hospital Stay (HOSPITAL_BASED_OUTPATIENT_CLINIC_OR_DEPARTMENT_OTHER): Payer: Medicare HMO | Admitting: Oncology

## 2017-11-04 ENCOUNTER — Inpatient Hospital Stay: Payer: Medicare HMO | Attending: Oncology

## 2017-11-04 VITALS — BP 115/74 | HR 101 | Temp 98.6°F | Wt 154.1 lb

## 2017-11-04 DIAGNOSIS — C184 Malignant neoplasm of transverse colon: Secondary | ICD-10-CM | POA: Insufficient documentation

## 2017-11-04 DIAGNOSIS — Z800 Family history of malignant neoplasm of digestive organs: Secondary | ICD-10-CM | POA: Diagnosis not present

## 2017-11-04 DIAGNOSIS — D72821 Monocytosis (symptomatic): Secondary | ICD-10-CM | POA: Insufficient documentation

## 2017-11-04 DIAGNOSIS — Z87891 Personal history of nicotine dependence: Secondary | ICD-10-CM | POA: Insufficient documentation

## 2017-11-04 DIAGNOSIS — Z853 Personal history of malignant neoplasm of breast: Secondary | ICD-10-CM | POA: Diagnosis not present

## 2017-11-04 DIAGNOSIS — R530 Neoplastic (malignant) related fatigue: Secondary | ICD-10-CM | POA: Diagnosis not present

## 2017-11-04 DIAGNOSIS — Z923 Personal history of irradiation: Secondary | ICD-10-CM | POA: Insufficient documentation

## 2017-11-04 DIAGNOSIS — Z5111 Encounter for antineoplastic chemotherapy: Secondary | ICD-10-CM | POA: Diagnosis not present

## 2017-11-04 DIAGNOSIS — G620 Drug-induced polyneuropathy: Secondary | ICD-10-CM | POA: Insufficient documentation

## 2017-11-04 DIAGNOSIS — Z452 Encounter for adjustment and management of vascular access device: Secondary | ICD-10-CM | POA: Insufficient documentation

## 2017-11-04 LAB — CBC WITH DIFFERENTIAL/PLATELET
Basophils Absolute: 0.1 10*3/uL (ref 0–0.1)
Basophils Relative: 1 %
Eosinophils Absolute: 0.2 10*3/uL (ref 0–0.7)
Eosinophils Relative: 2 %
HEMATOCRIT: 43.4 % (ref 35.0–47.0)
HEMOGLOBIN: 14.9 g/dL (ref 12.0–16.0)
LYMPHS ABS: 2.2 10*3/uL (ref 1.0–3.6)
Lymphocytes Relative: 20 %
MCH: 35 pg — AB (ref 26.0–34.0)
MCHC: 34.3 g/dL (ref 32.0–36.0)
MCV: 102 fL — ABNORMAL HIGH (ref 80.0–100.0)
MONO ABS: 1.4 10*3/uL — AB (ref 0.2–0.9)
MONOS PCT: 12 %
NEUTROS ABS: 7.3 10*3/uL — AB (ref 1.4–6.5)
Neutrophils Relative %: 65 %
Platelets: 345 10*3/uL (ref 150–440)
RBC: 4.25 MIL/uL (ref 3.80–5.20)
RDW: 14.5 % (ref 11.5–14.5)
WBC: 11.2 10*3/uL — ABNORMAL HIGH (ref 3.6–11.0)

## 2017-11-04 LAB — COMPREHENSIVE METABOLIC PANEL
ALBUMIN: 3.9 g/dL (ref 3.5–5.0)
ALK PHOS: 75 U/L (ref 38–126)
ALT: 16 U/L (ref 14–54)
AST: 24 U/L (ref 15–41)
Anion gap: 10 (ref 5–15)
BUN: 13 mg/dL (ref 6–20)
CHLORIDE: 100 mmol/L — AB (ref 101–111)
CO2: 27 mmol/L (ref 22–32)
CREATININE: 0.78 mg/dL (ref 0.44–1.00)
Calcium: 9.3 mg/dL (ref 8.9–10.3)
GFR calc Af Amer: >60 mL/min (ref >60)
GFR calc non Af Amer: >60 mL/min (ref >60)
GLUCOSE: 119 mg/dL — AB (ref 65–99)
Potassium: 3.7 mmol/L (ref 3.5–5.1)
Sodium: 137 mmol/L (ref 135–145)
Total Bilirubin: 0.6 mg/dL (ref 0.3–1.2)
Total Protein: 7.8 g/dL (ref 6.5–8.1)

## 2017-11-04 LAB — MAGNESIUM: Magnesium: 1.9 mg/dL (ref 1.7–2.4)

## 2017-11-04 MED ORDER — LEUCOVORIN CALCIUM INJECTION 100 MG
20.0000 mg/m2 | Freq: Once | INTRAMUSCULAR | Status: AC
  Administered 2017-11-04: 36 mg via INTRAVENOUS
  Filled 2017-11-04: qty 1.8

## 2017-11-04 MED ORDER — DEXTROSE 5 % IV SOLN
Freq: Once | INTRAVENOUS | Status: AC
  Administered 2017-11-04: 10:00:00 via INTRAVENOUS
  Filled 2017-11-04: qty 1000

## 2017-11-04 MED ORDER — LEUCOVORIN CALCIUM INJECTION 350 MG: 400.0000 mg/m2 | Freq: Once | INTRAVENOUS | Status: DC

## 2017-11-04 MED ORDER — FLUOROURACIL CHEMO INJECTION 2.5 GM/50ML
400.0000 mg/m2 | Freq: Once | INTRAVENOUS | Status: AC
  Administered 2017-11-04: 700 mg via INTRAVENOUS
  Filled 2017-11-04: qty 14

## 2017-11-04 MED ORDER — SODIUM CHLORIDE 0.9% FLUSH
10.0000 mL | INTRAVENOUS | Status: DC | PRN
  Administered 2017-11-04: 10 mL
  Filled 2017-11-04: qty 10

## 2017-11-04 MED ORDER — PALONOSETRON HCL INJECTION 0.25 MG/5ML
0.2500 mg | Freq: Once | INTRAVENOUS | Status: AC
  Administered 2017-11-04: 0.25 mg via INTRAVENOUS
  Filled 2017-11-04: qty 5

## 2017-11-04 MED ORDER — SODIUM CHLORIDE 0.9 % IV SOLN
2400.0000 mg/m2 | INTRAVENOUS | Status: DC
  Administered 2017-11-04: 4200 mg via INTRAVENOUS
  Filled 2017-11-04: qty 84

## 2017-11-04 NOTE — Progress Notes (Signed)
Patient here today for follow up.  Patient states no new concerns today  

## 2017-11-06 ENCOUNTER — Inpatient Hospital Stay: Payer: Medicare HMO

## 2017-11-06 VITALS — BP 138/76 | HR 110 | Temp 98.8°F | Resp 20

## 2017-11-06 DIAGNOSIS — Z87891 Personal history of nicotine dependence: Secondary | ICD-10-CM | POA: Diagnosis not present

## 2017-11-06 DIAGNOSIS — C184 Malignant neoplasm of transverse colon: Secondary | ICD-10-CM | POA: Diagnosis not present

## 2017-11-06 DIAGNOSIS — G620 Drug-induced polyneuropathy: Secondary | ICD-10-CM | POA: Diagnosis not present

## 2017-11-06 DIAGNOSIS — Z5111 Encounter for antineoplastic chemotherapy: Secondary | ICD-10-CM | POA: Diagnosis not present

## 2017-11-06 DIAGNOSIS — Z923 Personal history of irradiation: Secondary | ICD-10-CM | POA: Diagnosis not present

## 2017-11-06 DIAGNOSIS — R530 Neoplastic (malignant) related fatigue: Secondary | ICD-10-CM | POA: Diagnosis not present

## 2017-11-06 DIAGNOSIS — D72821 Monocytosis (symptomatic): Secondary | ICD-10-CM | POA: Diagnosis not present

## 2017-11-06 DIAGNOSIS — Z452 Encounter for adjustment and management of vascular access device: Secondary | ICD-10-CM | POA: Diagnosis not present

## 2017-11-06 DIAGNOSIS — Z853 Personal history of malignant neoplasm of breast: Secondary | ICD-10-CM | POA: Diagnosis not present

## 2017-11-06 MED ORDER — SODIUM CHLORIDE 0.9% FLUSH
10.0000 mL | INTRAVENOUS | Status: DC | PRN
  Administered 2017-11-06: 10 mL
  Filled 2017-11-06: qty 10

## 2017-11-06 MED ORDER — HEPARIN SOD (PORK) LOCK FLUSH 100 UNIT/ML IV SOLN
500.0000 [IU] | Freq: Once | INTRAVENOUS | Status: AC | PRN
  Administered 2017-11-06: 500 [IU]

## 2017-11-15 ENCOUNTER — Other Ambulatory Visit: Payer: Self-pay | Admitting: Family Medicine

## 2017-11-15 DIAGNOSIS — M5136 Other intervertebral disc degeneration, lumbar region: Secondary | ICD-10-CM

## 2017-11-17 MED ORDER — HYDROCODONE-ACETAMINOPHEN 7.5-325 MG PO TABS: 1.0000 | ORAL_TABLET | Freq: Four times a day (QID) | ORAL | 0 refills | Status: DC | PRN

## 2017-11-18 ENCOUNTER — Ambulatory Visit
Admission: RE | Admit: 2017-11-18 | Discharge: 2017-11-18 | Disposition: A | Discharge status: Home / Self Care | Payer: Medicare HMO | Source: Ambulatory Visit | Attending: Radiation Oncology | Admitting: Radiation Oncology

## 2017-11-18 DIAGNOSIS — Z853 Personal history of malignant neoplasm of breast: Secondary | ICD-10-CM | POA: Insufficient documentation

## 2017-11-18 DIAGNOSIS — Z9221 Personal history of antineoplastic chemotherapy: Secondary | ICD-10-CM | POA: Diagnosis not present

## 2017-11-18 DIAGNOSIS — C785 Secondary malignant neoplasm of large intestine and rectum: Secondary | ICD-10-CM | POA: Diagnosis not present

## 2017-11-18 DIAGNOSIS — Z87891 Personal history of nicotine dependence: Secondary | ICD-10-CM | POA: Diagnosis not present

## 2017-11-18 DIAGNOSIS — Z510 Encounter for antineoplastic radiation therapy: Secondary | ICD-10-CM | POA: Diagnosis not present

## 2017-11-18 DIAGNOSIS — C779 Secondary and unspecified malignant neoplasm of lymph node, unspecified: Secondary | ICD-10-CM | POA: Diagnosis not present

## 2017-11-18 DIAGNOSIS — C184 Malignant neoplasm of transverse colon: Secondary | ICD-10-CM | POA: Diagnosis not present

## 2017-11-26 DIAGNOSIS — C785 Secondary malignant neoplasm of large intestine and rectum: Secondary | ICD-10-CM | POA: Diagnosis not present

## 2017-11-26 DIAGNOSIS — Z853 Personal history of malignant neoplasm of breast: Secondary | ICD-10-CM | POA: Diagnosis not present

## 2017-11-26 DIAGNOSIS — C184 Malignant neoplasm of transverse colon: Secondary | ICD-10-CM | POA: Diagnosis not present

## 2017-11-26 DIAGNOSIS — C779 Secondary and unspecified malignant neoplasm of lymph node, unspecified: Secondary | ICD-10-CM | POA: Diagnosis not present

## 2017-11-26 DIAGNOSIS — Z87891 Personal history of nicotine dependence: Secondary | ICD-10-CM | POA: Diagnosis not present

## 2017-11-26 DIAGNOSIS — Z9221 Personal history of antineoplastic chemotherapy: Secondary | ICD-10-CM | POA: Diagnosis not present

## 2017-11-26 DIAGNOSIS — Z510 Encounter for antineoplastic radiation therapy: Secondary | ICD-10-CM | POA: Diagnosis not present

## 2017-11-28 ENCOUNTER — Other Ambulatory Visit: Payer: Self-pay | Admitting: *Deleted

## 2017-11-28 DIAGNOSIS — C184 Malignant neoplasm of transverse colon: Secondary | ICD-10-CM

## 2017-12-01 ENCOUNTER — Encounter: Payer: Self-pay | Admitting: Cardiothoracic Surgery

## 2017-12-01 ENCOUNTER — Ambulatory Visit (INDEPENDENT_AMBULATORY_CARE_PROVIDER_SITE_OTHER): Payer: Medicare HMO | Admitting: Cardiothoracic Surgery

## 2017-12-01 ENCOUNTER — Telehealth: Payer: Self-pay | Admitting: General Practice

## 2017-12-01 ENCOUNTER — Inpatient Hospital Stay
Admission: AD | Admit: 2017-12-01 | Discharge: 2017-12-05 | DRG: 330 | Disposition: A | Discharge status: Home Health | Payer: Medicare HMO | Source: Ambulatory Visit | Attending: Surgery | Admitting: Surgery

## 2017-12-01 ENCOUNTER — Ambulatory Visit
Admission: RE | Admit: 2017-12-01 | Discharge: 2017-12-01 | Disposition: A | Discharge status: Home / Self Care | Payer: Medicare HMO | Source: Ambulatory Visit | Attending: Radiation Oncology | Admitting: Radiation Oncology

## 2017-12-01 ENCOUNTER — Other Ambulatory Visit: Payer: Self-pay

## 2017-12-01 VITALS — BP 126/82 | HR 114 | Temp 98.1°F | Resp 14 | Ht 64.0 in | Wt 159.0 lb

## 2017-12-01 DIAGNOSIS — Z800 Family history of malignant neoplasm of digestive organs: Secondary | ICD-10-CM

## 2017-12-01 DIAGNOSIS — C184 Malignant neoplasm of transverse colon: Secondary | ICD-10-CM | POA: Diagnosis present

## 2017-12-01 DIAGNOSIS — G629 Polyneuropathy, unspecified: Secondary | ICD-10-CM | POA: Diagnosis present

## 2017-12-01 DIAGNOSIS — C189 Malignant neoplasm of colon, unspecified: Secondary | ICD-10-CM | POA: Diagnosis not present

## 2017-12-01 DIAGNOSIS — K435 Parastomal hernia without obstruction or  gangrene: Secondary | ICD-10-CM | POA: Diagnosis present

## 2017-12-01 DIAGNOSIS — Z9221 Personal history of antineoplastic chemotherapy: Secondary | ICD-10-CM | POA: Diagnosis not present

## 2017-12-01 DIAGNOSIS — K0889 Other specified disorders of teeth and supporting structures: Secondary | ICD-10-CM | POA: Diagnosis present

## 2017-12-01 DIAGNOSIS — K9189 Other postprocedural complications and disorders of digestive system: Secondary | ICD-10-CM

## 2017-12-01 DIAGNOSIS — M810 Age-related osteoporosis without current pathological fracture: Secondary | ICD-10-CM | POA: Diagnosis present

## 2017-12-01 DIAGNOSIS — F17211 Nicotine dependence, cigarettes, in remission: Secondary | ICD-10-CM | POA: Diagnosis present

## 2017-12-01 DIAGNOSIS — E785 Hyperlipidemia, unspecified: Secondary | ICD-10-CM | POA: Diagnosis present

## 2017-12-01 DIAGNOSIS — K439 Ventral hernia without obstruction or gangrene: Secondary | ICD-10-CM | POA: Diagnosis not present

## 2017-12-01 DIAGNOSIS — Z87891 Personal history of nicotine dependence: Secondary | ICD-10-CM | POA: Diagnosis not present

## 2017-12-01 DIAGNOSIS — K9409 Other complications of colostomy: Secondary | ICD-10-CM

## 2017-12-01 DIAGNOSIS — K219 Gastro-esophageal reflux disease without esophagitis: Secondary | ICD-10-CM | POA: Diagnosis present

## 2017-12-01 DIAGNOSIS — Z95828 Presence of other vascular implants and grafts: Secondary | ICD-10-CM

## 2017-12-01 DIAGNOSIS — M1710 Unilateral primary osteoarthritis, unspecified knee: Secondary | ICD-10-CM | POA: Diagnosis present

## 2017-12-01 DIAGNOSIS — Y833 Surgical operation with formation of external stoma as the cause of abnormal reaction of the patient, or of later complication, without mention of misadventure at the time of the procedure: Secondary | ICD-10-CM | POA: Diagnosis present

## 2017-12-01 DIAGNOSIS — E559 Vitamin D deficiency, unspecified: Secondary | ICD-10-CM | POA: Diagnosis present

## 2017-12-01 DIAGNOSIS — Z86010 Personal history of colonic polyps: Secondary | ICD-10-CM | POA: Diagnosis not present

## 2017-12-01 DIAGNOSIS — R1909 Other intra-abdominal and pelvic swelling, mass and lump: Secondary | ICD-10-CM | POA: Diagnosis present

## 2017-12-01 DIAGNOSIS — K561 Intussusception: Secondary | ICD-10-CM | POA: Diagnosis present

## 2017-12-01 DIAGNOSIS — Z933 Colostomy status: Secondary | ICD-10-CM | POA: Diagnosis not present

## 2017-12-01 DIAGNOSIS — Z23 Encounter for immunization: Secondary | ICD-10-CM

## 2017-12-01 DIAGNOSIS — Z888 Allergy status to other drugs, medicaments and biological substances status: Secondary | ICD-10-CM

## 2017-12-01 DIAGNOSIS — Z9049 Acquired absence of other specified parts of digestive tract: Secondary | ICD-10-CM | POA: Diagnosis not present

## 2017-12-01 DIAGNOSIS — Z853 Personal history of malignant neoplasm of breast: Secondary | ICD-10-CM | POA: Diagnosis not present

## 2017-12-01 DIAGNOSIS — Z923 Personal history of irradiation: Secondary | ICD-10-CM

## 2017-12-01 DIAGNOSIS — C779 Secondary and unspecified malignant neoplasm of lymph node, unspecified: Secondary | ICD-10-CM | POA: Diagnosis not present

## 2017-12-01 DIAGNOSIS — Z510 Encounter for antineoplastic radiation therapy: Secondary | ICD-10-CM | POA: Insufficient documentation

## 2017-12-01 DIAGNOSIS — M5136 Other intervertebral disc degeneration, lumbar region: Secondary | ICD-10-CM | POA: Diagnosis present

## 2017-12-01 DIAGNOSIS — D509 Iron deficiency anemia, unspecified: Secondary | ICD-10-CM | POA: Diagnosis not present

## 2017-12-01 DIAGNOSIS — K567 Ileus, unspecified: Secondary | ICD-10-CM

## 2017-12-01 LAB — COMPREHENSIVE METABOLIC PANEL
ALT: 26 U/L (ref 14–54)
AST: 21 U/L (ref 15–41)
Albumin: 3.6 g/dL (ref 3.5–5.0)
Alkaline Phosphatase: 95 U/L (ref 38–126)
Anion gap: 9 (ref 5–15)
BILIRUBIN TOTAL: 0.8 mg/dL (ref 0.3–1.2)
BUN: 14 mg/dL (ref 6–20)
CO2: 25 mmol/L (ref 22–32)
CREATININE: 0.76 mg/dL (ref 0.44–1.00)
Calcium: 8.6 mg/dL — ABNORMAL LOW (ref 8.9–10.3)
Chloride: 103 mmol/L (ref 101–111)
Glucose, Bld: 99 mg/dL (ref 65–99)
POTASSIUM: 4 mmol/L (ref 3.5–5.1)
Sodium: 137 mmol/L (ref 135–145)
TOTAL PROTEIN: 7.2 g/dL (ref 6.5–8.1)

## 2017-12-01 LAB — CBC
HEMATOCRIT: 43 % (ref 35.0–47.0)
Hemoglobin: 14.6 g/dL (ref 12.0–16.0)
MCH: 34.5 pg — AB (ref 26.0–34.0)
MCHC: 33.9 g/dL (ref 32.0–36.0)
MCV: 101.8 fL — AB (ref 80.0–100.0)
Platelets: 357 10*3/uL (ref 150–440)
RBC: 4.22 MIL/uL (ref 3.80–5.20)
RDW: 14.3 % (ref 11.5–14.5)
WBC: 17.8 10*3/uL — AB (ref 3.6–11.0)

## 2017-12-01 LAB — PROTIME-INR
INR: 1
PROTHROMBIN TIME: 13.1 s (ref 11.4–15.2)

## 2017-12-01 LAB — APTT: aPTT: 30 seconds (ref 24–36)

## 2017-12-01 LAB — SURGICAL PCR SCREEN
MRSA, PCR: NEGATIVE
Staphylococcus aureus: NEGATIVE

## 2017-12-01 MED ORDER — SODIUM CHLORIDE 0.9% FLUSH
3.0000 mL | Freq: Two times a day (BID) | INTRAVENOUS | Status: DC
  Administered 2017-12-01: 10 mL via INTRAVENOUS
  Administered 2017-12-02 – 2017-12-05 (×6): 3 mL via INTRAVENOUS

## 2017-12-01 MED ORDER — OXYCODONE-ACETAMINOPHEN 5-325 MG PO TABS
1.0000 | ORAL_TABLET | ORAL | Status: DC | PRN
  Administered 2017-12-01 – 2017-12-04 (×8): 2 via ORAL
  Filled 2017-12-01 (×9): qty 2

## 2017-12-01 MED ORDER — SODIUM CHLORIDE 0.9% FLUSH: 10.0000 mL | INTRAVENOUS | Status: DC | PRN

## 2017-12-01 MED ORDER — INFLUENZA VAC SPLIT HIGH-DOSE 0.5 ML IM SUSY
0.5000 mL | PREFILLED_SYRINGE | INTRAMUSCULAR | Status: DC
  Filled 2017-12-01: qty 0.5

## 2017-12-01 NOTE — Progress Notes (Signed)
Patient colostomy bag changed due to leak, patient tolerated well. Reddish clear liquid in bag prior to removal with 50 ml.

## 2017-12-01 NOTE — Telephone Encounter (Signed)
Patient is calling said she is swollen on the right side, patient is asking if someone could give her a call. Please call and advise.

## 2017-12-01 NOTE — Telephone Encounter (Signed)
Patient came into office and was seen office by Dr Genevive Bi. Per Dr. Adonis Huguenin patient was direct admitted at this time.

## 2017-12-01 NOTE — Progress Notes (Signed)
  Patient ID: Kimberly [name], female   DOB: May 24, 1946, 72 y.o.   MRN: [id number]  HISTORY: This patient has a history of colon cancer and is status post transverse colectomy with end stoma and mucous fistula.  She states that she has been doing well since her surgery several months ago.  2 days ago she began noticing an increase in the swelling around her stoma and over the last 48 hours has had prolapse of her stoma approximately 5-6 cm.  Associated with this has been the presence of liquid stool whereas prior it had been solid.  She states that she did place some sugar on the stoma itself which has improved some of the swelling.  She is hungry.  She says she has a good appetite.  She has minimal abdominal pain.   Vitals:   12/01/17 1209  BP: 126/82  Pulse: (!) 114  Resp: 14  Temp: 98.1 F (36.7 C)  SpO2: 97%     EXAM:  On abdominal exam her abdomen is protuberant but soft and nontender.  There are active bowel sounds throughout.  She has a pink mucous fistula.  It is not active.  In the right upper quadrant there is a prolapsed and edematous and colostomy.  Digital examination shows a tight lumen at the level of the fascia.  I am unable to admit the tip of my small finger.  There is some liquid stool present.  The stoma itself is viable.  ASSESSMENT: Prolapsed stoma   PLAN:   I discussed her care with Dr. Sanda Klein.  He would like to bring her into the hospital for management.  We will go ahead and admit her to the hospital for observation and she will be examined by him later today.  We will check some routine laboratory studies.    Nestor Lewandowsky, MD

## 2017-12-01 NOTE — Patient Instructions (Signed)
Patient to be  direct admitted per Dr.Oaks.

## 2017-12-02 ENCOUNTER — Inpatient Hospital Stay: Payer: Medicare HMO

## 2017-12-02 ENCOUNTER — Observation Stay: Payer: Medicare HMO

## 2017-12-02 ENCOUNTER — Ambulatory Visit: Payer: Medicare HMO

## 2017-12-02 ENCOUNTER — Inpatient Hospital Stay: Payer: Medicare HMO | Admitting: Oncology

## 2017-12-02 DIAGNOSIS — K219 Gastro-esophageal reflux disease without esophagitis: Secondary | ICD-10-CM | POA: Diagnosis present

## 2017-12-02 DIAGNOSIS — R1909 Other intra-abdominal and pelvic swelling, mass and lump: Secondary | ICD-10-CM | POA: Diagnosis present

## 2017-12-02 DIAGNOSIS — Z9221 Personal history of antineoplastic chemotherapy: Secondary | ICD-10-CM | POA: Diagnosis not present

## 2017-12-02 DIAGNOSIS — Z23 Encounter for immunization: Secondary | ICD-10-CM | POA: Diagnosis present

## 2017-12-02 DIAGNOSIS — Y833 Surgical operation with formation of external stoma as the cause of abnormal reaction of the patient, or of later complication, without mention of misadventure at the time of the procedure: Secondary | ICD-10-CM | POA: Diagnosis present

## 2017-12-02 DIAGNOSIS — M810 Age-related osteoporosis without current pathological fracture: Secondary | ICD-10-CM | POA: Diagnosis present

## 2017-12-02 DIAGNOSIS — K9409 Other complications of colostomy: Secondary | ICD-10-CM | POA: Diagnosis present

## 2017-12-02 DIAGNOSIS — Z9049 Acquired absence of other specified parts of digestive tract: Secondary | ICD-10-CM | POA: Diagnosis not present

## 2017-12-02 DIAGNOSIS — M1710 Unilateral primary osteoarthritis, unspecified knee: Secondary | ICD-10-CM | POA: Diagnosis present

## 2017-12-02 DIAGNOSIS — Z853 Personal history of malignant neoplasm of breast: Secondary | ICD-10-CM | POA: Diagnosis not present

## 2017-12-02 DIAGNOSIS — Z800 Family history of malignant neoplasm of digestive organs: Secondary | ICD-10-CM | POA: Diagnosis not present

## 2017-12-02 DIAGNOSIS — F17211 Nicotine dependence, cigarettes, in remission: Secondary | ICD-10-CM | POA: Diagnosis present

## 2017-12-02 DIAGNOSIS — Z923 Personal history of irradiation: Secondary | ICD-10-CM | POA: Diagnosis not present

## 2017-12-02 DIAGNOSIS — C189 Malignant neoplasm of colon, unspecified: Secondary | ICD-10-CM | POA: Diagnosis not present

## 2017-12-02 DIAGNOSIS — K0889 Other specified disorders of teeth and supporting structures: Secondary | ICD-10-CM | POA: Diagnosis present

## 2017-12-02 DIAGNOSIS — G629 Polyneuropathy, unspecified: Secondary | ICD-10-CM | POA: Diagnosis present

## 2017-12-02 DIAGNOSIS — K561 Intussusception: Secondary | ICD-10-CM | POA: Diagnosis present

## 2017-12-02 DIAGNOSIS — K435 Parastomal hernia without obstruction or  gangrene: Secondary | ICD-10-CM | POA: Diagnosis present

## 2017-12-02 DIAGNOSIS — E785 Hyperlipidemia, unspecified: Secondary | ICD-10-CM | POA: Diagnosis present

## 2017-12-02 DIAGNOSIS — Z888 Allergy status to other drugs, medicaments and biological substances status: Secondary | ICD-10-CM | POA: Diagnosis not present

## 2017-12-02 DIAGNOSIS — Z86010 Personal history of colonic polyps: Secondary | ICD-10-CM | POA: Diagnosis not present

## 2017-12-02 DIAGNOSIS — E559 Vitamin D deficiency, unspecified: Secondary | ICD-10-CM | POA: Diagnosis present

## 2017-12-02 DIAGNOSIS — Z95828 Presence of other vascular implants and grafts: Secondary | ICD-10-CM | POA: Diagnosis not present

## 2017-12-02 DIAGNOSIS — C184 Malignant neoplasm of transverse colon: Secondary | ICD-10-CM | POA: Diagnosis present

## 2017-12-02 DIAGNOSIS — M5136 Other intervertebral disc degeneration, lumbar region: Secondary | ICD-10-CM | POA: Diagnosis present

## 2017-12-02 LAB — CBC
HEMATOCRIT: 40.5 % (ref 35.0–47.0)
Hemoglobin: 13.7 g/dL (ref 12.0–16.0)
MCH: 34.3 pg — ABNORMAL HIGH (ref 26.0–34.0)
MCHC: 33.8 g/dL (ref 32.0–36.0)
MCV: 101.6 fL — AB (ref 80.0–100.0)
Platelets: 345 10*3/uL (ref 150–440)
RBC: 3.98 MIL/uL (ref 3.80–5.20)
RDW: 14 % (ref 11.5–14.5)
WBC: 15.2 10*3/uL — AB (ref 3.6–11.0)

## 2017-12-02 LAB — BASIC METABOLIC PANEL
ANION GAP: 11 (ref 5–15)
BUN: 10 mg/dL (ref 6–20)
CALCIUM: 8.6 mg/dL — AB (ref 8.9–10.3)
CO2: 27 mmol/L (ref 22–32)
Chloride: 101 mmol/L (ref 101–111)
Creatinine, Ser: 0.71 mg/dL (ref 0.44–1.00)
GFR calc Af Amer: >60 mL/min (ref >60)
GLUCOSE: 88 mg/dL (ref 65–99)
POTASSIUM: 3.9 mmol/L (ref 3.5–5.1)
Sodium: 139 mmol/L (ref 135–145)

## 2017-12-02 LAB — MAGNESIUM: Magnesium: 2 mg/dL (ref 1.7–2.4)

## 2017-12-02 LAB — PHOSPHORUS: PHOSPHORUS: 3.8 mg/dL (ref 2.5–4.6)

## 2017-12-02 MED ORDER — SODIUM CHLORIDE 0.9 % IV SOLN
1.0000 g | INTRAVENOUS | Status: AC
  Administered 2017-12-03: 1 g via INTRAVENOUS
  Filled 2017-12-02: qty 1

## 2017-12-02 MED ORDER — SODIUM CHLORIDE 0.9 % IV BOLUS (SEPSIS)
1000.0000 mL | Freq: Once | INTRAVENOUS | Status: AC
  Administered 2017-12-02: 1000 mL via INTRAVENOUS

## 2017-12-02 MED ORDER — IOPAMIDOL (ISOVUE-300) INJECTION 61%
15.0000 mL | INTRAVENOUS | Status: AC
  Administered 2017-12-02 (×2): 15 mL via ORAL

## 2017-12-02 MED ORDER — ACETAMINOPHEN 10 MG/ML IV SOLN
1000.0000 mg | Freq: Four times a day (QID) | INTRAVENOUS | Status: DC
  Administered 2017-12-02 – 2017-12-03 (×3): 1000 mg via INTRAVENOUS
  Filled 2017-12-02 (×4): qty 100

## 2017-12-02 MED ORDER — LACTATED RINGERS IV SOLN
INTRAVENOUS | Status: DC
  Administered 2017-12-02 – 2017-12-03 (×3): via INTRAVENOUS

## 2017-12-02 MED ORDER — KETOROLAC TROMETHAMINE 15 MG/ML IJ SOLN
15.0000 mg | Freq: Four times a day (QID) | INTRAMUSCULAR | Status: DC
  Administered 2017-12-02 – 2017-12-05 (×12): 15 mg via INTRAVENOUS
  Filled 2017-12-02 (×11): qty 1

## 2017-12-02 MED ORDER — IOPAMIDOL (ISOVUE-300) INJECTION 61%
100.0000 mL | Freq: Once | INTRAVENOUS | Status: AC | PRN
  Administered 2017-12-02: 100 mL via INTRAVENOUS

## 2017-12-02 NOTE — Consult Note (Addendum)
[facility] Nurse ostomy consult note Stoma type/location: RLQ prolapsing transverse colostomy with parastomal hernia and LUQ mucus fistula measuring 1 and 1/8 inch round, red, moist and slightly raised. Prolapse began on Friday, March 1. Stomal assessment/size: RLQ colostomy measures 3 inches in diameter, is prolapsed 4 inches from skin level into pouch. Mucosa next to skin is pink, moist; mucosa at distal end of prolapse is edematous and discolored brown/grey.  Peristomal assessment: Intact, small area of erythema at 7 o'clock where patient had been placing additional adhesive to prevent leakage of pouching system. Treatment options for stomal/peristomal skin:  2 skin barrier rings pieced together to encircle large, prolapsing stoma. Output: small amount of brown liquid stool in pouch Ostomy pouching: 2pc. 4-inch pouching system with 2 skin barrier rings. Education/treatment provided: two packets of sugar sprinkled on mucosa prior to repouching.  Bedside RN is instructed to place a washcloth on top of ostomy pouching system and to place an ice bag on washcloth for 30 minutes three times daily at Dr. Corlis Leak request. Two, 4-inch pouches and skin barriers (2 piece pouching system) as well as skin barrier rings are left at bedside with pattern for ostomy pouch change if required by nurse in the absence of [facility] Nurse. Note:  Patient is drinking prep for CT scan at this time and is experiencing colicky, cramping pain in the RLQ. She is taking liquids only in the event surgery is scheduled for tomorrow for prolapse/hernia repair. Enrolled patient in [place] program: Yes, previously  [facility] nursing team will follow, and will remain available to this patient, the nursing and medical teams.   Thanks, Maudie Flakes, MSN, RN, [place], Arther Abbott  Pager# [phone]

## 2017-12-02 NOTE — Progress Notes (Signed)
Received phone call from radiology tech Elmyra Ricks  pertaining to patient start of  radiation therapy today. I told the tech that I would contact the provider to see if he wanted her to start today. Per Dr. Dahlia Byes " I have not seen the patient yet, let me come up and see her first". I called radiology back and Radiology tech Amy stated " she will not start today then it's best if we just start on another day if she hasn't had it yet".

## 2017-12-02 NOTE — H&P (Signed)
Patient ID: Kimberly [name], female   DOB: Apr 23, 1946, 73 y.o.   MRN: [id number]  HPI Kimberly [name] is a 72 y.o. female rec admitted by Dr. Genevive Bi who graciously saw the patient in the office.  She had a history of an obstructive transverse colon cancer and require transverse colectomy with end colostomy and mucous fistula. She has recently completed her chemotherapy and was supposed to start radiation therapy.  Proximally 2-3 days ago started having swelling to her around her stoma and pain.  No nausea no vomiting she continues to have some liquid stool.  She did place sugar on her stoma with significant improvement.  Pain is intermittent and worsening when he presses around the ostomy site.  No fevers no chills. We have obtained a KUB showing no signs of bowel obstruction.  No free air or pneumatosis. White count was elevated arrival and she had a normal creatinine.    HPI  Past Medical History:  Diagnosis Date  . Breast cancer ([place])   . Breast cancer, left ([facility])    Lumpectomy and rad tx's.  . Chronic back pain   . Chronic knee pain   . Colon cancer ([place])   . Degenerative disc disease, lumbar 04/2013  . Dyspnea   . Family history of adverse reaction to anesthesia    son arrested after anesthesia  . GERD (gastroesophageal reflux disease)   . Hyperlipidemia   . Iron deficiency anemia 05/27/2017  . Neuropathy   . Osteoarthritis    of knee  . Osteoporosis   . Tobacco abuse   . Vitamin D deficiency     Past Surgical History:  Procedure Laterality Date  . APPENDECTOMY  1965  . BREAST EXCISIONAL BIOPSY Left 08/01/2003   lumpectomy rad 11/04-2/28/2005  . CESAREAN SECTION     x3  . COLON SURGERY    . COLONOSCOPY W/ POLYPECTOMY    . ESOPHAGOGASTRODUODENOSCOPY (EGD) WITH PROPOFOL N/A 04/04/2017   Procedure: ESOPHAGOGASTRODUODENOSCOPY (EGD) WITH PROPOFOL;  Surgeon: [name], MD;  Location: [facility];  Service: Endoscopy;  Laterality: N/A;  . HIP FRACTURE SURGERY Left 01/25/2012   . LAPAROTOMY N/A 04/15/2017   Procedure: EXPLORATORY LAPAROTOMY;  Surgeon: Clayburn Pert, MD;  Location: [facility];  Service: General;  Laterality: N/A;  . NECK SURGERY  12/2011  . PORTACATH PLACEMENT Right 05/21/2017   Procedure: INSERTION PORT-A-CATH;  Surgeon: Clayburn Pert, MD;  Location: [facility];  Service: General;  Laterality: Right;  . WRIST FRACTURE SURGERY      Family History  Problem Relation Age of Onset  . Diabetes Mother        type 2  . Coronary artery disease Mother   . Deep vein thrombosis Mother   . Colon cancer Father   . Asthma Brother   . Diabetes Brother        type 2    Social History Social History   Tobacco Use  . Smoking status: Former Smoker    Packs/day: 0.25    Years: 55.00    Pack years: 13.75    Types: Cigarettes    Last attempt to quit: 05/21/2017    Years since quitting: 0.5  . Smokeless tobacco: Never Used  . Tobacco comment: started age 21 1/2 to 1 ppd  Substance Use Topics  . Alcohol use: Yes    Alcohol/week: 0.0 oz    Comment: occasionally drinks beer  . Drug use: No    Allergies  Allergen Reactions  . Iodine Other (See Comments)  Topical iodine she states "if you put it in an open sore it will cause a eating ulcer."  . Other Nausea And Vomiting    Antihystamines  . Sinus Formula [Cholestatin] Nausea Only    Current Facility-Administered Medications  Medication Dose Route Frequency Provider Last Rate Last Dose  . Influenza vac split quadrivalent PF (FLUZONE HIGH-DOSE) injection 0.5 mL  0.5 mL Intramuscular Tomorrow-1000 Nestor Lewandowsky, MD      . iopamidol (ISOVUE-300) 61 % injection 15 mL  15 mL Oral Q1 Hr x 2 Aneudy Champlain F, MD      . lactated ringers infusion   Intravenous Continuous Chynah Orihuela F, MD      . oxyCODONE-acetaminophen (PERCOCET/ROXICET) 5-325 MG per tablet 1-2 tablet  1-2 tablet Oral Q4H PRN Nestor Lewandowsky, MD   2 tablet at 12/02/17 [phone]  . sodium chloride 0.9 % bolus 1,000 mL  1,000 mL Intravenous Once  Tigerlily Christine, [place] F, MD      . sodium chloride flush (NS) 0.9 % injection 10-40 mL  10-40 mL Intracatheter PRN Nestor Lewandowsky, MD      . sodium chloride flush (NS) 0.9 % injection 3 mL  3 mL Intravenous Q12H Nestor Lewandowsky, MD   10 mL at 12/01/17 2049   Facility-Administered Medications Ordered in Other Encounters  Medication Dose Route Frequency Provider Last Rate Last Dose  . 0.9 %  sodium chloride infusion   Intravenous Continuous Earlie Server, MD   Stopped at 07/22/17 1150  . heparin lock flush 100 unit/mL  500 Units Intravenous Once Earlie Server, MD      . heparin lock flush 100 unit/mL  500 Units Intravenous Once Earlie Server, MD      . heparin lock flush 100 unit/mL  500 Units Intravenous Once Earlie Server, MD      . sodium chloride flush (NS) 0.9 % injection 10 mL  10 mL Intracatheter PRN Earlie Server, MD      . sodium chloride flush (NS) 0.9 % injection 10 mL  10 mL Intravenous PRN Earlie Server, MD   10 mL at 05/29/17 1347     Review of Systems Full ROS  was asked and was negative except for the information on the HPI  Physical Exam Blood pressure (!) 148/56, pulse 77, temperature 98.4 F (36.9 C), temperature source Oral, resp. rate 20, height 5\' 5"  (1.651 m), weight 72.1 kg (158 lb 14.4 oz), SpO2 95 %. CONSTITUTIONAL: Elderly female in NAD EYES: Pupils are equal, round, and reactive to light, Sclera are non-icteric. EARS, NOSE, MOUTH AND THROAT: The oropharynx is clear. The oral mucosa is pink and moist. Hearing is intact to voice. LYMPH NODES:  Lymph nodes in the neck are normal. RESPIRATORY:  Lungs are clear. There is normal respiratory effort, with equal breath sounds bilaterally, and without pathologic use of accessory muscles. CARDIOVASCULAR: Heart is regular without murmurs, gallops, or rubs. GI: The abdomen is soft, there is a prolapase colostomy on the right side, no evidence of ischemia or necrosis, it is tender to palpation and I am unable to reduce it. Previous midline scar MF LUQ. No  peritonitis GU: Rectal deferred.   MUSCULOSKELETAL: Normal muscle strength and tone. No cyanosis or edema.   SKIN: Turgor is good and there are no pathologic skin lesions or ulcers. NEUROLOGIC: Motor and sensation is grossly normal. Cranial nerves are grossly intact. PSYCH:  Oriented to person, place and time. Affect is normal.  Data Reviewed  I have personally reviewed the patient's imaging,  laboratory findings and medical records.    Assessment/ Plan 72 year old female with prolapse ostomy in addition to parastomal hernia. We will consult the ostomy nurse.  In the meantime treat her conservatively with ice packs and sugar to the ostomy and will try to manually reduce it. We will also obtain a CT scan of the abdomen and pelvis to have a better idea of her parastomal hernia for potential operative planning. If medical management fails she may need a revision of her ostomy and possibly repair of the parastomal hernia. Currently she is not toxic and her ostomy seems viable.  No need for emergent surgical indication at this time.  I will keep her n.p.o. after midnight in case we may have to revise the ostomy tomorrow. We will hold radiation therapy for her acute illness. Extensive counseling provided to the patient and the family.  [name] Hamman, MD FACS General Surgeon 12/02/2017, 10:47 AM

## 2017-12-02 NOTE — Progress Notes (Signed)
Ct scan reviewed no evidence of perforation or frank colon ischemia or obstruction. We will plan for colostomy revision and parastomal hernia repair. She understand that this is a difficult case given her mulitple issues, acuity and high recurrence rate. D/W the pt and family in detail about the procedure, risks, benefits and possible complications . They understand.

## 2017-12-03 ENCOUNTER — Inpatient Hospital Stay: Payer: Medicare HMO | Admitting: Anesthesiology

## 2017-12-03 ENCOUNTER — Encounter: Payer: Self-pay | Admitting: Anesthesiology

## 2017-12-03 ENCOUNTER — Ambulatory Visit: Payer: Medicare HMO

## 2017-12-03 ENCOUNTER — Encounter
Admission: AD | Disposition: A | Discharge status: Home Health | Payer: Self-pay | Source: Ambulatory Visit | Attending: Surgery

## 2017-12-03 DIAGNOSIS — C184 Malignant neoplasm of transverse colon: Secondary | ICD-10-CM

## 2017-12-03 HISTORY — PX: PARASTOMAL HERNIA REPAIR: SHX2162

## 2017-12-03 SURGERY — REPAIR, HERNIA, PARASTOMAL
Anesthesia: General | Wound class: Contaminated

## 2017-12-03 MED ORDER — HYDROMORPHONE HCL 1 MG/ML IJ SOLN
0.2500 mg | INTRAMUSCULAR | Status: DC | PRN
  Administered 2017-12-03 (×4): 0.5 mg via INTRAVENOUS

## 2017-12-03 MED ORDER — ONDANSETRON HCL 4 MG/2ML IJ SOLN
INTRAMUSCULAR | Status: DC | PRN
  Administered 2017-12-03: 4 mg via INTRAVENOUS

## 2017-12-03 MED ORDER — HYDROMORPHONE HCL 1 MG/ML IJ SOLN
INTRAMUSCULAR | Status: AC
  Administered 2017-12-03: 0.5 mg via INTRAVENOUS
  Filled 2017-12-03: qty 1

## 2017-12-03 MED ORDER — GABAPENTIN 400 MG PO CAPS
400.0000 mg | ORAL_CAPSULE | Freq: Three times a day (TID) | ORAL | Status: DC
  Administered 2017-12-03 – 2017-12-05 (×6): 400 mg via ORAL
  Filled 2017-12-03 (×6): qty 1

## 2017-12-03 MED ORDER — HYDROMORPHONE HCL 1 MG/ML IJ SOLN
INTRAMUSCULAR | Status: DC | PRN
  Administered 2017-12-03 (×2): 0.5 mg via INTRAVENOUS

## 2017-12-03 MED ORDER — METOPROLOL TARTRATE 5 MG/5ML IV SOLN
INTRAVENOUS | Status: DC | PRN
  Administered 2017-12-03: 3 mg via INTRAVENOUS

## 2017-12-03 MED ORDER — GABAPENTIN 400 MG PO CAPS: 400.0000 mg | ORAL_CAPSULE | Freq: Two times a day (BID) | ORAL | Status: DC

## 2017-12-03 MED ORDER — LORAZEPAM 0.5 MG PO TABS: 0.5000 mg | ORAL_TABLET | Freq: Four times a day (QID) | ORAL | Status: DC | PRN

## 2017-12-03 MED ORDER — PANTOPRAZOLE SODIUM 40 MG PO TBEC
40.0000 mg | DELAYED_RELEASE_TABLET | Freq: Every day | ORAL | Status: DC
  Administered 2017-12-03 – 2017-12-05 (×3): 40 mg via ORAL
  Filled 2017-12-03 (×3): qty 1

## 2017-12-03 MED ORDER — ONDANSETRON HCL 4 MG/2ML IJ SOLN: 4.0000 mg | Freq: Once | INTRAMUSCULAR | Status: DC | PRN

## 2017-12-03 MED ORDER — INFLUENZA VAC SPLIT HIGH-DOSE 0.5 ML IM SUSY
0.5000 mL | PREFILLED_SYRINGE | Freq: Once | INTRAMUSCULAR | Status: AC
  Administered 2017-12-05: 0.5 mL via INTRAMUSCULAR
  Filled 2017-12-03: qty 0.5

## 2017-12-03 MED ORDER — KETOROLAC TROMETHAMINE 15 MG/ML IJ SOLN
INTRAMUSCULAR | Status: AC
  Administered 2017-12-03: 15 mg via INTRAVENOUS
  Filled 2017-12-03: qty 1

## 2017-12-03 MED ORDER — FENTANYL CITRATE (PF) 250 MCG/5ML IJ SOLN
INTRAMUSCULAR | Status: AC
  Filled 2017-12-03: qty 5

## 2017-12-03 MED ORDER — FLUTICASONE FUROATE-VILANTEROL 100-25 MCG/INH IN AEPB
1.0000 | INHALATION_SPRAY | Freq: Every day | RESPIRATORY_TRACT | Status: DC
  Administered 2017-12-03 – 2017-12-05 (×3): 1 via RESPIRATORY_TRACT
  Filled 2017-12-03: qty 28

## 2017-12-03 MED ORDER — DEXAMETHASONE SODIUM PHOSPHATE 10 MG/ML IJ SOLN
INTRAMUSCULAR | Status: AC
Start: 2017-12-03 — End: 2017-12-03
  Filled 2017-12-03: qty 1

## 2017-12-03 MED ORDER — ROCURONIUM BROMIDE 100 MG/10ML IV SOLN
INTRAVENOUS | Status: DC | PRN
  Administered 2017-12-03: 20 mg via INTRAVENOUS
  Administered 2017-12-03: 30 mg via INTRAVENOUS
  Administered 2017-12-03: 20 mg via INTRAVENOUS

## 2017-12-03 MED ORDER — ONDANSETRON HCL 4 MG/2ML IJ SOLN
INTRAMUSCULAR | Status: AC
  Filled 2017-12-03: qty 2

## 2017-12-03 MED ORDER — CYCLOBENZAPRINE HCL 10 MG PO TABS
5.0000 mg | ORAL_TABLET | Freq: Three times a day (TID) | ORAL | Status: DC
  Administered 2017-12-03 – 2017-12-05 (×6): 5 mg via ORAL
  Filled 2017-12-03 (×6): qty 1

## 2017-12-03 MED ORDER — ROCURONIUM BROMIDE 50 MG/5ML IV SOLN
INTRAVENOUS | Status: AC
  Filled 2017-12-03: qty 1

## 2017-12-03 MED ORDER — FENTANYL CITRATE (PF) 100 MCG/2ML IJ SOLN
INTRAMUSCULAR | Status: AC
  Administered 2017-12-03: 25 ug via INTRAVENOUS
  Filled 2017-12-03: qty 2

## 2017-12-03 MED ORDER — BOOST / RESOURCE BREEZE PO LIQD CUSTOM
1.0000 | Freq: Three times a day (TID) | ORAL | Status: DC
  Administered 2017-12-03 – 2017-12-05 (×6): 1 via ORAL

## 2017-12-03 MED ORDER — ONDANSETRON HCL 4 MG/2ML IJ SOLN: 4.0000 mg | Freq: Four times a day (QID) | INTRAMUSCULAR | Status: DC | PRN

## 2017-12-03 MED ORDER — PROCHLORPERAZINE MALEATE 10 MG PO TABS
10.0000 mg | ORAL_TABLET | Freq: Four times a day (QID) | ORAL | Status: DC | PRN
  Filled 2017-12-03: qty 1

## 2017-12-03 MED ORDER — FENTANYL CITRATE (PF) 100 MCG/2ML IJ SOLN
INTRAMUSCULAR | Status: DC | PRN
  Administered 2017-12-03: 50 ug via INTRAVENOUS
  Administered 2017-12-03 (×2): 100 ug via INTRAVENOUS

## 2017-12-03 MED ORDER — DEXAMETHASONE SODIUM PHOSPHATE 10 MG/ML IJ SOLN
INTRAMUSCULAR | Status: DC | PRN
  Administered 2017-12-03: 10 mg via INTRAVENOUS

## 2017-12-03 MED ORDER — DEXMEDETOMIDINE HCL 200 MCG/2ML IV SOLN
INTRAVENOUS | Status: DC | PRN
  Administered 2017-12-03: 12 ug via INTRAVENOUS
  Administered 2017-12-03: 8 ug via INTRAVENOUS

## 2017-12-03 MED ORDER — HYDROMORPHONE HCL 1 MG/ML IJ SOLN
INTRAMUSCULAR | Status: AC
  Filled 2017-12-03: qty 1

## 2017-12-03 MED ORDER — SUGAMMADEX SODIUM 200 MG/2ML IV SOLN
INTRAVENOUS | Status: AC
  Filled 2017-12-03: qty 2

## 2017-12-03 MED ORDER — MIDAZOLAM HCL 2 MG/2ML IJ SOLN
INTRAMUSCULAR | Status: AC
  Filled 2017-12-03: qty 2

## 2017-12-03 MED ORDER — MORPHINE SULFATE (PF) 4 MG/ML IV SOLN
4.0000 mg | INTRAVENOUS | Status: DC | PRN
  Administered 2017-12-03 – 2017-12-05 (×2): 4 mg via INTRAVENOUS
  Filled 2017-12-03 (×2): qty 1

## 2017-12-03 MED ORDER — SUGAMMADEX SODIUM 200 MG/2ML IV SOLN
INTRAVENOUS | Status: DC | PRN
  Administered 2017-12-03: 153.4 mg via INTRAVENOUS

## 2017-12-03 MED ORDER — LIDOCAINE HCL (PF) 2 % IJ SOLN
INTRAMUSCULAR | Status: AC
  Filled 2017-12-03: qty 10

## 2017-12-03 MED ORDER — DEXTROSE IN LACTATED RINGERS 5 % IV SOLN
INTRAVENOUS | Status: DC
  Administered 2017-12-03: 14:00:00 via INTRAVENOUS

## 2017-12-03 MED ORDER — PROPOFOL 10 MG/ML IV BOLUS
INTRAVENOUS | Status: AC
  Filled 2017-12-03: qty 20

## 2017-12-03 MED ORDER — FENTANYL CITRATE (PF) 100 MCG/2ML IJ SOLN
25.0000 ug | INTRAMUSCULAR | Status: DC | PRN
  Administered 2017-12-03: 50 ug via INTRAVENOUS
  Administered 2017-12-03 (×2): 25 ug via INTRAVENOUS

## 2017-12-03 MED ORDER — PROPOFOL 10 MG/ML IV BOLUS
INTRAVENOUS | Status: DC | PRN
  Administered 2017-12-03: 100 mg via INTRAVENOUS

## 2017-12-03 SURGICAL SUPPLY — 34 items
APPLIER CLIP 11 MED OPEN (CLIP)
BLADE CLIPPER SURG (BLADE) ×3 IMPLANT
BLADE SURG 15 STRL LF DISP TIS (BLADE) ×1 IMPLANT
BLADE SURG 15 STRL SS (BLADE) ×2
BULB RESERV EVAC DRAIN JP 100C (MISCELLANEOUS) ×3 IMPLANT
CANISTER SUCT 1200ML W/VALVE (MISCELLANEOUS) ×3 IMPLANT
CHLORAPREP W/TINT 26ML (MISCELLANEOUS) ×3 IMPLANT
CLIP APPLIE 11 MED OPEN (CLIP) IMPLANT
DRAIN CHANNEL JP 19F (MISCELLANEOUS) ×3 IMPLANT
DRAPE INCISE IOBAN 66X45 STRL (DRAPES) IMPLANT
DRAPE LAPAROTOMY 77X122 PED (DRAPES) ×3 IMPLANT
DRSG TELFA 3X8 NADH (GAUZE/BANDAGES/DRESSINGS) ×3 IMPLANT
ELECT CAUTERY BLADE 6.4 (BLADE) ×3 IMPLANT
ELECT REM PT RETURN 9FT ADLT (ELECTROSURGICAL) ×3
ELECTRODE REM PT RTRN 9FT ADLT (ELECTROSURGICAL) ×1 IMPLANT
GLOVE BIO SURGEON STRL SZ7 (GLOVE) ×9 IMPLANT
GOWN STRL REUS W/ TWL LRG LVL3 (GOWN DISPOSABLE) ×2 IMPLANT
GOWN STRL REUS W/TWL LRG LVL3 (GOWN DISPOSABLE) ×4
LIGASURE IMPACT 36 18CM CVD LR (INSTRUMENTS) ×3 IMPLANT
MESH BIO-A  9X15 SYN MAT (Mesh General) ×2 IMPLANT
MESH BIO-A 7X10 SYN MAT (Mesh General) ×3 IMPLANT
MESH BIO-A 9X15 SYN MAT (Mesh General) ×1 IMPLANT
NEEDLE HYPO 22GX1.5 SAFETY (NEEDLE) ×3 IMPLANT
NS IRRIG 500ML POUR BTL (IV SOLUTION) ×3 IMPLANT
PACK BASIN MINOR ARMC (MISCELLANEOUS) ×3 IMPLANT
RELOAD PROXIMATE 75MM BLUE (ENDOMECHANICALS) ×9 IMPLANT
SPONGE LAP 18X18 5 PK (GAUZE/BANDAGES/DRESSINGS) ×3 IMPLANT
STAPLER PROXIMATE 75MM BLUE (STAPLE) ×3 IMPLANT
SUT ETHIBOND 0 MO6 C/R (SUTURE) ×9 IMPLANT
SUT ETHIBOND NAB MO 7 #0 18IN (SUTURE) ×3 IMPLANT
SUT MNCRL AB 4-0 PS2 18 (SUTURE) ×3 IMPLANT
SUT VIC AB 3-0 SH 27 (SUTURE) ×2
SUT VIC AB 3-0 SH 27X BRD (SUTURE) ×1 IMPLANT
SYR 20CC LL (SYRINGE) ×3 IMPLANT

## 2017-12-03 NOTE — Anesthesia Procedure Notes (Signed)
Procedure Name: Intubation Date/Time: 12/03/2017 10:15 AM Performed by: Marsh Dolly, CRNA Pre-anesthesia Checklist: Patient identified, Patient being monitored, Timeout performed, Emergency Drugs available and Suction available Patient Re-evaluated:Patient Re-evaluated prior to induction Oxygen Delivery Method: Circle system utilized Preoxygenation: Pre-oxygenation with 100% oxygen Induction Type: IV induction Ventilation: Mask ventilation without difficulty Laryngoscope Size: Mac and 3 Grade View: Grade I Tube type: Oral Tube size: 7.0 mm Number of attempts: 1 Airway Equipment and Method: Stylet Placement Confirmation: ETT inserted through vocal cords under direct vision,  positive ETCO2 and breath sounds checked- equal and bilateral Secured at: 21 cm Tube secured with: Tape Dental Injury: Teeth and Oropharynx as per pre-operative assessment

## 2017-12-03 NOTE — Op Note (Signed)
PROCEDURES: 1.Right hemicolectomy w end ileostomy 2. Abdominal wall mass excision 3. Repair of parastomal hernia w BioA Mesh   Pre-operative Diagnosis: Parastomal hernia, prolapse colostomy  Post-operative Diagnosis: same  Surgeon: Jules Husbands MD FACS  Assistants: DR. Genevive Bi required for exposure and complex repair of hernia  Anesthesia: General endotracheal anesthesia  ASA Class: 3  Surgeon: [name] Hamman , MD FACS  Anesthesia: Gen. with endotracheal tube   Findings: 1. Colostomy prolapse with intussusception of the terminal ileum into the cecum 2. Complex parastomal hernia 3. ? Pathological nodes in the mesentery of the right colon. 4. Abdominal wall mass adjacent to the parastomal hernia defect ? Metastatic disese  Estimated Blood Loss: 50cc         Drains: 19 blake drain         Specimens: Right colon , abd wall mass       Complications: none               Condition: stable  Procedure Details  The patient was seen again in the Holding Room. The benefits, complications, treatment options, and expected outcomes were discussed with the patient. The risks of bleeding, infection, recurrence of symptoms, failure to resolve symptoms,  bowel injury, any of which could require further surgery were reviewed with the patient.   The patient was taken to Operating Room, identified as Kimberly [name] and the procedure verified.  A Time Out was held and the above information confirmed.  Prior to the induction of general anesthesia, antibiotic prophylaxis was administered. VTE prophylaxis was in place. General endotracheal anesthesia was then administered and tolerated well. After the induction, the abdomen was prepped with Chloraprep and draped in the sterile fashion. The patient was positioned in the supine position.  Pursestring was placed into the end colostomy.  An elliptical incision was performed incorporating the end colostomy.  Electrocautery was used to dissect through  subcutaneous tissue and we dissected the hernia sac from adjacent structures.  I were able to identify of the fascia and entered the abdominal cavity.  We were very careful not to injure the bowel.  Once we had adequate mobilization of the end colostomy we realized that the terminal ileum was intussuscepted into the cecum.  There was a component of the end colostomy that was ischemic.  For this reason the right operation was to do a right hemicolectomy.  We went ahead and created a small mesenteric defect in the terminal ileum and fired a GIA stapler.  The mesentery of the terminal ileum the right colon was divided with a combination of suture ligature and LigaSure device.  Visualize some pathological lymph nodes within the right colon and incorporated in a resection. Once we were doing a circumferential dissection we notice an abdominal wall mass inferior to the parastomal hernia.  Using LigaSure were able to resected in the standard fashion. No other abdominal wall defect was created in the right lower quadrant medial to the ASIS.  Difference was created to allow the passage of the terminal ileum. She was then returned to the complex parastomal hernia we completed our dissection and excision of the sac.  There was a large defect were able to reduce all the abdominal contents.  We decided to do a retro-rectus approach and we incised the posterior rectus sheath and perform a circumferential dissection.  There was a very nice plane. The posterior sheath was closed with a running 0 PDS in the standard fashion. We selected a bio A mesh  measuring 9 x 15 and place it in a retrorectus fashion.  We anchored the mesh to the abdominal wall using interrupted 0 PDS sutures. We also closed the anterior sheath with interrupted 0 Ethibond sutures but there was a central defect that we had to reinforce with another piece of bio a in an overlay fashion.  This was anchored to the abdominal wall using interrupted Ethibond  sutures. A Blake drain was placed within the subcu of the defect in the standard fashion and secured with a 3-0 nylon.  Subcutaneous tissue was closed with 2-0 Vicryl and the skin was closed with staples.  Attention then was turned to the ileostomy and we proceeded to probe the ileostomy in the standard fashion with 3-0 Vicryl's.  We secured the edges of the bowel to the ileum with interrupted 2-0 Vicryl sutures. Colostomy device was applied.   Needle and laparotomy count were correct and there were no immediate complications  [name] Hamman, MD, FACS

## 2017-12-03 NOTE — Anesthesia Preprocedure Evaluation (Signed)
Anesthesia Evaluation  Patient identified by MRN, date of birth, ID band Patient awake    Reviewed: Allergy & Precautions, NPO status , Patient's Chart, lab work & pertinent test results  History of Anesthesia Complications Negative for: history of anesthetic complications  Airway Mallampati: II  TM Distance: >3 FB Neck ROM: Full    Dental  (+) Poor Dentition   Pulmonary neg sleep apnea, neg COPD, neg recent URI, former smoker,    breath sounds clear to auscultation- rhonchi (-) wheezing      Cardiovascular Exercise Tolerance: Good (-) hypertension(-) angina+ CAD and + Peripheral Vascular Disease  (-) Past MI, (-) Cardiac Stents and (-) CABG (-) dysrhythmias (-) Valvular Problems/Murmurs Rhythm:Regular Rate:Normal - Systolic murmurs and - Diastolic murmurs    Neuro/Psych negative neurological ROS  negative psych ROS   GI/Hepatic Neg liver ROS, GERD  ,  Endo/Other  negative endocrine ROSneg diabetes  Renal/GU negative Renal ROS     Musculoskeletal  (+) Arthritis ,   Abdominal (+) - obese,   Peds  Hematology negative hematology ROS (+)   Anesthesia Other Findings Past Medical History: No date: Breast cancer ([facility]) No date: Breast cancer, left ([facility])     Comment:  Lumpectomy and rad tx's. No date: Chronic back pain No date: Chronic knee pain No date: Colon cancer ([place]) 04/2013: Degenerative disc disease, lumbar No date: Dyspnea No date: Family history of adverse reaction to anesthesia     Comment:  son arrested after anesthesia No date: GERD (gastroesophageal reflux disease) No date: Hyperlipidemia No date: Neuropathy No date: Osteoarthritis     Comment:  of knee No date: Osteoporosis No date: Tobacco abuse No date: Vitamin D deficiency   Reproductive/Obstetrics                             Anesthesia Physical  Anesthesia Plan  ASA: III  Anesthesia Plan: General   Post-op Pain  Management:    Induction: Intravenous  PONV Risk Score and Plan: 3 and Ondansetron and Dexamethasone  Airway Management Planned: Oral ETT  Additional Equipment:   Intra-op Plan:   Post-operative Plan: Extubation in OR  Informed Consent: I have reviewed the patients History and Physical, chart, labs and discussed the procedure including the risks, benefits and alternatives for the proposed anesthesia with the patient or authorized representative who has indicated his/her understanding and acceptance.   Dental advisory given  Plan Discussed with: CRNA and Anesthesiologist  Anesthesia Plan Comments:         Anesthesia Quick Evaluation

## 2017-12-03 NOTE — Progress Notes (Signed)
Per MD okay for RN to put order for 4 mg of morphine Q4.

## 2017-12-03 NOTE — Progress Notes (Signed)
Preoperative Review   Patient is met in the preoperative holding area. The history is reviewed in the chart and with the patient. I personally reviewed the options and rationale as well as the risks of this procedure that have been previously discussed with the patient. All questions asked by the patient and/or family were answered to their satisfaction.  Patient agrees to proceed with this procedure at this time.  Kalayna Noy M.D. FACS   

## 2017-12-03 NOTE — Anesthesia Post-op Follow-up Note (Signed)
Anesthesia QCDR form completed.        

## 2017-12-03 NOTE — Transfer of Care (Signed)
Immediate Anesthesia Transfer of Care Note  Patient: Kimberly [name]  Procedure(s) Performed: HERNIA REPAIR PARASTOMAL (N/A )  Patient Location: PACU  Anesthesia Type:General  Level of Consciousness: awake, alert  and oriented  Airway & Oxygen Therapy: Patient Spontanous Breathing and Patient connected to face mask oxygen  Post-op Assessment: Report given to RN and Post -op Vital signs reviewed and stable  Post vital signs: Reviewed and stable  Last Vitals:  Vitals:   12/03/17 0540 12/03/17 0902  BP: (!) 151/68 (!) 150/79  Pulse: 65 64  Resp: 20 (!) 22  Temp: 37.4 C 36.9 C  SpO2: 96% 97%    Last Pain:  Vitals:   12/03/17 0902  TempSrc: Oral  PainSc: 4       Patients Stated Pain Goal: 0 (70/76/15 1834)  Complications: No apparent anesthesia complications

## 2017-12-04 ENCOUNTER — Ambulatory Visit: Payer: Medicare HMO

## 2017-12-04 ENCOUNTER — Encounter: Payer: Self-pay | Admitting: Surgery

## 2017-12-04 LAB — CBC
HEMATOCRIT: 35.6 % (ref 35.0–47.0)
HEMOGLOBIN: 11.8 g/dL — AB (ref 12.0–16.0)
MCH: 33.3 pg (ref 26.0–34.0)
MCHC: 33.2 g/dL (ref 32.0–36.0)
MCV: 100.5 fL — ABNORMAL HIGH (ref 80.0–100.0)
Platelets: 314 10*3/uL (ref 150–440)
RBC: 3.54 MIL/uL — ABNORMAL LOW (ref 3.80–5.20)
RDW: 13.6 % (ref 11.5–14.5)
WBC: 22.2 10*3/uL — ABNORMAL HIGH (ref 3.6–11.0)

## 2017-12-04 LAB — BASIC METABOLIC PANEL
Anion gap: 9 (ref 5–15)
BUN: 9 mg/dL (ref 6–20)
CHLORIDE: 103 mmol/L (ref 101–111)
CO2: 27 mmol/L (ref 22–32)
CREATININE: 0.58 mg/dL (ref 0.44–1.00)
Calcium: 8.1 mg/dL — ABNORMAL LOW (ref 8.9–10.3)
GFR calc Af Amer: >60 mL/min (ref >60)
GFR calc non Af Amer: >60 mL/min (ref >60)
GLUCOSE: 111 mg/dL — AB (ref 65–99)
Potassium: 3.9 mmol/L (ref 3.5–5.1)
SODIUM: 139 mmol/L (ref 135–145)

## 2017-12-04 NOTE — Consult Note (Addendum)
[facility] Nurse ostomy consult note Stoma type/location: LLQ ileostomy Stomal assessment/size: Stoma is located very low on torso in a crease.  Today mucosa measures 2 inches oval with elevated, budded ostomy toward lateral side measuring 1 and 3/8 inch. Os at center and pointing upward. Peristomal assessment: deep creases through and proximal to stoma Treatment options for stomal/peristomal skin: skin barrier ring today Output: scant amount of serosanguinous dringe in bottom of pouch. Small amount of brown stool at os during pouch change. Ostomy pouching: 2pc. 2 and 3/4 inch pouching system with skin barrier ring.  Proximal edge of tape border is trimmed to accommodate the OpSite honeycomb dressing placed over the former prolapsed ostomy/hernia repair site.  The mucus fistula dressing from OR is left in place today.  Patient typically changes these every three (3) days. Education provided: Patient is observing pouch change, asking why ostomy is so low and if she will be able to care for it. I teach that we will try different pouching supplies until one is determined to be best for her.  May require a 1-piece flat pouch with skin barrier ring. Recommend HHRN at discharge for assistance with new ostomy and experimentation with pouching supplies until routine is established.  If you agree, please order. Enrolled patient in [facility] program: Yes, previously, but no correction to program for supplies needed since revision.  [facility] nursing team will follow, and will remain available to this patient, the nursing, surgical and medical teams.  Thanks, Maudie Flakes, MSN, RN, [place], Arther Abbott  Pager# [phone]

## 2017-12-04 NOTE — Progress Notes (Signed)
POD # 1 Colostomy functioning AVSS  PE NAD Abd: soft, incisions c/d/i. Ostomy pink and patent Drain serosanguinous  A/P Doing well Advance diet ambulate DC in 23-48 hrs

## 2017-12-04 NOTE — Anesthesia Postprocedure Evaluation (Signed)
Anesthesia Post Note  Patient: Kimberly [name]  Procedure(s) Performed: HERNIA REPAIR PARASTOMAL (N/A )  Patient location during evaluation: PACU Anesthesia Type: General Level of consciousness: awake and alert Pain management: pain level controlled Vital Signs Assessment: post-procedure vital signs reviewed and stable Respiratory status: spontaneous breathing, nonlabored ventilation, respiratory function stable and patient connected to nasal cannula oxygen Cardiovascular status: blood pressure returned to baseline and stable Postop Assessment: no apparent nausea or vomiting Anesthetic complications: no     Last Vitals:  Vitals:   12/03/17 2018 12/04/17 0401  BP: (!) 121/57 (!) 117/58  Pulse: 70 71  Resp: 20   Temp: 36.7 C 36.7 C  SpO2: 91% 95%    Last Pain:  Vitals:   12/04/17 0446  TempSrc:   PainSc: Asleep                 Martha Clan

## 2017-12-05 DIAGNOSIS — Z23 Encounter for immunization: Secondary | ICD-10-CM | POA: Diagnosis not present

## 2017-12-05 LAB — SURGICAL PATHOLOGY

## 2017-12-05 MED ORDER — HYDROCODONE-ACETAMINOPHEN 5-325 MG PO TABS: 1.0000 | ORAL_TABLET | Freq: Four times a day (QID) | ORAL | 0 refills | Status: DC | PRN

## 2017-12-05 MED ORDER — HEPARIN SOD (PORK) LOCK FLUSH 100 UNIT/ML IV SOLN
500.0000 [IU] | Freq: Once | INTRAVENOUS | Status: AC
  Administered 2017-12-05: 500 [IU] via INTRAVENOUS
  Filled 2017-12-05: qty 5

## 2017-12-05 NOTE — Progress Notes (Signed)
Kimberly [name]  A and O x 4. VSS. Pt tolerating diet well. No complaints of pain or nausea. Port deaccessed, prescriptions given. Pt and daughter voiced understanding of discharge instructions with no further questions. Pt discharged via wheelchair with nurse aide. Education given in regard to JP drain and verified home health setup.   [name] MSN, RN-BC  Allergies as of 12/05/2017      Reactions   Betadine [povidone Iodine] Other (See Comments)   Topical iodine she states "if you put it in an open sore it will cause a eating ulcer."   Other Nausea And Vomiting   Antihystamines   Sinus Formula [cholestatin] Nausea Only      Medication List    TAKE these medications   budesonide-formoterol 80-4.5 MCG/ACT inhaler Commonly known as:  SYMBICORT Inhale 2 puffs into the lungs 2 (two) times daily.   chlorhexidine 0.12 % solution Commonly known as:  PERIDEX USE AS DIRECTED 15 MLS IN THE MOUTH OR THROAT 2 (TWO) TIMES DAILY. SWISH AND SPIT   CITRACAL/VITAMIN D PO Take 1 tablet by mouth daily.   feeding supplement Liqd Take 1 Container by mouth 3 (three) times daily between meals.   gabapentin 400 MG capsule Commonly known as:  NEURONTIN TAKE 1 CAPSULE TWICE DAILY   HYDROcodone-acetaminophen 7.5-325 MG tablet Commonly known as:  NORCO Take 1-2 tablets by mouth every 6 (six) hours as needed for moderate pain. What changed:  Another medication with the same name was added. Make sure you understand how and when to take each.   HYDROcodone-acetaminophen 5-325 MG tablet Commonly known as:  NORCO/VICODIN Take 1 tablet by mouth every 6 (six) hours as needed for moderate pain. What changed:  You were already taking a medication with the same name, and this prescription was added. Make sure you understand how and when to take each.   ibuprofen 200 MG tablet Commonly known as:  ADVIL,MOTRIN Take 400 mg by mouth daily as needed for headache or moderate pain.   lidocaine-prilocaine  cream Commonly known as:  EMLA Apply to affected area once   LORazepam 0.5 MG tablet Commonly known as:  ATIVAN Take 1 tablet (0.5 mg total) by mouth every 6 (six) hours as needed (Nausea or vomiting).   magnesium chloride 64 MG Tbec SR tablet Commonly known as:  SLOW-MAG Take 1 tablet by mouth daily.   meloxicam 15 MG tablet Commonly known as:  MOBIC TAKE 1 TABLET EVERY DAY AS NEEDED   NASACORT ALLERGY 24HR NA Place 1 spray into the nose daily as needed (allergies).   omeprazole 20 MG capsule Commonly known as:  PRILOSEC TAKE 1 CAPSULE EVERY DAY   ondansetron 8 MG tablet Commonly known as:  ZOFRAN Take 1 tablet (8 mg total) by mouth 2 (two) times daily as needed for refractory nausea / vomiting. Start on day 3 after chemotherapy.   pravastatin 40 MG tablet Commonly known as:  PRAVACHOL TAKE 1 TABLET (40 MG TOTAL) BY MOUTH DAILY.   PROBIOTIC DAILY PO Take 1 capsule by mouth daily.   prochlorperazine 10 MG tablet Commonly known as:  COMPAZINE Take 1 tablet (10 mg total) by mouth every 6 (six) hours as needed (Nausea or vomiting).   pyridOXINE 50 MG tablet Commonly known as:  VITAMIN B-6 Take 1 tablet (50 mg total) by mouth daily.   simethicone 80 MG chewable tablet Commonly known as:  MYLICON Chew 2 tablets (160 mg total) by mouth every 6 (six) hours as needed for  flatulence.   VITAMIN B 12 PO Take 1 tablet by mouth daily.            Discharge Care Instructions  (From admission, onward)        Start     Ordered   12/05/17 0000  Change dressing (specify)    Comments:  Dressing change: change drain sponge daily   12/05/17 0848      Vitals:   12/05/17 0953 12/05/17 1426  BP: (!) 118/51 (!) 128/59  Pulse: 71 74  Resp:    Temp: 98.1 F (36.7 C) 98.4 F (36.9 C)  SpO2: 96% 93%

## 2017-12-05 NOTE — Progress Notes (Signed)
Per Dr. Dahlia Byes place order to flush port a cath with heparin prior to deaccessing it.

## 2017-12-05 NOTE — Progress Notes (Signed)
Notified Dr. Dahlia Byes that mucous fistula started putting out and okay to place bag on it.

## 2017-12-05 NOTE — Care Management Important Message (Signed)
Important Message  Patient Details  Name: Kimberly [name] MRN: [id number] Date of Birth: 07/24/1946   Medicare Important Message Given:  Yes    Beverly Sessions, RN 12/05/2017, 11:33 AM

## 2017-12-05 NOTE — Discharge Summary (Signed)
Patient ID: Kimberly [name] MRN: [id number] DOB/AGE: 1946-05-23 72 y.o.  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  She was taken to the operating room and a completion right hemicolectomy with an ileostomy was performed as well as a repair of parastomal hernia.  He did very well postoperatively.  Her diet was advanced and her pain was controlled.  At the time of discharge she was ambulating, tolerating regular diet and her illeostomy was working.  Our ostomy nurse suggested home health for ostomy care. Her physical exam at the time of discharge showed a female no acute distress.  Awake and alert.  Vital signs stable.  Abdomen: Incision healing well with staples.  Drain with serous fluid.  Ileostomy pink and patent.  No peritonitis.  Extremities well perfused and warm.  Condition at time of discharge was stable   Disposition: 01-Home or Self Care  Discharge Instructions    Call MD for:  difficulty breathing, headache or visual disturbances   Complete by:  As directed    Call MD for:  extreme fatigue   Complete by:  As directed    Call MD for:  hives   Complete by:  As directed    Call MD for:  persistant dizziness or light-headedness   Complete by:  As directed    Call MD for:  persistant nausea and vomiting   Complete by:  As directed    Call MD for:  redness, tenderness, or signs of infection (pain, swelling, redness, odor or green/yellow discharge around incision site)   Complete by:  As directed    Call MD for:  severe uncontrolled pain   Complete by:  As directed    Call MD for:  temperature >100.4   Complete by:  As directed    Change dressing (specify)   Complete by:  As directed    Dressing change: change drain sponge daily   Diet - low sodium heart healthy   Complete by:  As directed    Discharge instructions   Complete by:  As directed    Please teach pt about JP care and ileostomy care   Increase activity slowly   Complete by:  As directed    Lifting restrictions   Complete by:  As directed    20 lbs x 6 wks     Allergies as of 12/05/2017      Reactions   Betadine [povidone Iodine] Other (See Comments)   Topical iodine she states "if you put it in an open sore it will cause a eating ulcer."   Other Nausea And Vomiting   Antihystamines   Sinus Formula [cholestatin] Nausea Only      Medication List    TAKE these medications   budesonide-formoterol 80-4.5 MCG/ACT inhaler Commonly known as:  SYMBICORT Inhale 2 puffs into the lungs 2 (two) times daily.   chlorhexidine 0.12 % solution Commonly known as:  PERIDEX USE AS DIRECTED 15 MLS IN THE MOUTH OR THROAT 2 (TWO) TIMES DAILY. SWISH AND SPIT   CITRACAL/VITAMIN D PO Take 1 tablet by mouth daily.   feeding supplement Liqd Take 1 Container by mouth 3 (three) times daily between meals.  gabapentin 400 MG capsule Commonly known as:  NEURONTIN TAKE 1 CAPSULE TWICE DAILY   HYDROcodone-acetaminophen 7.5-325 MG tablet Commonly known as:  NORCO Take 1-2 tablets by mouth every 6 (six) hours as needed for moderate pain. What changed:  Another medication with the same name was added. Make sure you understand how and when to take each.   HYDROcodone-acetaminophen 5-325 MG tablet Commonly known as:  NORCO/VICODIN Take 1 tablet by mouth every 6 (six) hours as needed for moderate pain. What changed:  You were already taking a medication with the same name, and this prescription was added. Make sure you understand how and when to take each.   ibuprofen 200 MG tablet Commonly known as:  ADVIL,MOTRIN Take 400 mg by mouth daily as needed for headache or  moderate pain.   lidocaine-prilocaine cream Commonly known as:  EMLA Apply to affected area once   LORazepam 0.5 MG tablet Commonly known as:  ATIVAN Take 1 tablet (0.5 mg total) by mouth every 6 (six) hours as needed (Nausea or vomiting).   magnesium chloride 64 MG Tbec SR tablet Commonly known as:  SLOW-MAG Take 1 tablet by mouth daily.   meloxicam 15 MG tablet Commonly known as:  MOBIC TAKE 1 TABLET EVERY DAY AS NEEDED   NASACORT ALLERGY 24HR NA Place 1 spray into the nose daily as needed (allergies).   omeprazole 20 MG capsule Commonly known as:  PRILOSEC TAKE 1 CAPSULE EVERY DAY   ondansetron 8 MG tablet Commonly known as:  ZOFRAN Take 1 tablet (8 mg total) by mouth 2 (two) times daily as needed for refractory nausea / vomiting. Start on day 3 after chemotherapy.   pravastatin 40 MG tablet Commonly known as:  PRAVACHOL TAKE 1 TABLET (40 MG TOTAL) BY MOUTH DAILY.   PROBIOTIC DAILY PO Take 1 capsule by mouth daily.   prochlorperazine 10 MG tablet Commonly known as:  COMPAZINE Take 1 tablet (10 mg total) by mouth every 6 (six) hours as needed (Nausea or vomiting).   pyridOXINE 50 MG tablet Commonly known as:  VITAMIN B-6 Take 1 tablet (50 mg total) by mouth daily.   simethicone 80 MG chewable tablet Commonly known as:  MYLICON Chew 2 tablets (160 mg total) by mouth every 6 (six) hours as needed for flatulence.   VITAMIN B 12 PO Take 1 tablet by mouth daily.            Discharge Care Instructions  (From admission, onward)        Start     Ordered   12/05/17 0000  Change dressing (specify)    Comments:  Dressing change: change drain sponge daily   12/05/17 0848     Follow-up Information    [facility], Marjory Lies, MD Follow up on 12/12/2017.   Specialty:  General Surgery Contact information: [address] [place] [address] [phone]            [name] Hamman, MD FACS

## 2017-12-05 NOTE — Consult Note (Addendum)
[facility] Nurse ostomy follow up Stoma type/location: LLQ ileostomy Stomal assessment/size: Oval, 2 inches wide x 1 and 3/8 inches from top to bottom with budded portion at lateral side.  Os in center and pointing upward Peristomal assessment: Not seen yesterday, pouch applied yesterday is intact. Treatment options for stomal/peristomal skin: skin barrier ring placed yesterday Output: soft brown stool Ostomy pouching:2pc.  2 and 3/4 inch pouching system with skin barrier ring Education provided: Patient is provided with 2 set-ups for discharge:  Skin barriers, pouches and skin barrier rings in the 2 and 3/4 inch size.  Enrolled patient in [place] Start Discharge program: Yes.  [facility] Start notified today that patient will require both 2 and 3/4 inch sets and flat one-piece pouches.  Recommend HHRN for a few weeks post discharge for monitoring of independence with new stoma in new location with parastomal creases.  If you agree, please order.  [facility] nursing team will follow along with you, and will remain available to this patient, the nursing, surgical, and medical teams while in house.    Thank you for this consult. Maudie Flakes, MSN, RN, [place], Ar[facility]  Pager# [phone]

## 2017-12-05 NOTE — Progress Notes (Signed)
Fistula continuing to put out, per MD pt to discharge with bag on fistula.

## 2017-12-05 NOTE — Care Management Note (Signed)
Case Management Note  Patient Details  Name: Kimberly [name] MRN: [id number] Date of Birth: [birth year]   Referral called to [facility] with [place] for home health referral for ostomy care  Subjective/Objective:                    Action/Plan:   Expected Discharge Date:  12/05/17               Expected Discharge Plan:  [place]  In-House Referral:     Discharge planning Services  CM Consult  Post Acute Care Choice:  Home Health Choice offered to:  Patient  DME Arranged:    DME Agency:     HH Arranged:  RN [place] Agency:  [place]  Status of Service:  Completed, signed off  If discussed at [place] of Stay Meetings, dates discussed:    Additional Comments:  Beverly Sessions, RN 12/05/2017, 1:56 PM

## 2017-12-10 ENCOUNTER — Encounter: Payer: Self-pay | Admitting: Surgery

## 2017-12-10 ENCOUNTER — Ambulatory Visit (INDEPENDENT_AMBULATORY_CARE_PROVIDER_SITE_OTHER): Payer: Medicare HMO | Admitting: Surgery

## 2017-12-10 VITALS — BP 119/71 | HR 101 | Temp 98.0°F | Ht 65.0 in | Wt 156.4 lb

## 2017-12-10 DIAGNOSIS — Z09 Encounter for follow-up examination after completed treatment for conditions other than malignant neoplasm: Secondary | ICD-10-CM

## 2017-12-10 NOTE — Patient Instructions (Addendum)
We removed your drain today, however you will need to keep a dressing over the area until the wound heals completely.   We will see you on Monday to remove your sutures.  Please see your follow up appointments listed below.

## 2017-12-10 NOTE — Progress Notes (Signed)
S/p Right hemicolectomy and parastomal hernia repair.  Doing well Minimal output from drain + PO, ostomy working Path no CA, d/w pt in detail  PE NAD Abd: incisions c/d/i, staples in place. No peritonitis. Ostomy pink  A/p Doing well Nurse visit for staples removal Monday RTC 2-3 months She will complete radiation and after that we may consider takedown

## 2017-12-11 ENCOUNTER — Inpatient Hospital Stay: Payer: Medicare HMO | Attending: Radiation Oncology

## 2017-12-11 ENCOUNTER — Encounter: Payer: Self-pay | Admitting: Surgery

## 2017-12-12 ENCOUNTER — Encounter: Payer: Medicare HMO | Admitting: Surgery

## 2017-12-15 ENCOUNTER — Ambulatory Visit: Payer: Medicare HMO

## 2017-12-15 DIAGNOSIS — Z4889 Encounter for other specified surgical aftercare: Secondary | ICD-10-CM

## 2017-12-15 NOTE — Patient Instructions (Signed)
Please see your appointment listed below. 

## 2017-12-15 NOTE — Progress Notes (Signed)
Staples removed. Incision was cleaned with Hibiclens. Matisol was applied and steri strips were placed over incision. No redness, or drainage was present.  Patient reminded to follow up in June 2019.

## 2017-12-16 ENCOUNTER — Other Ambulatory Visit: Payer: Self-pay | Admitting: Family Medicine

## 2017-12-16 DIAGNOSIS — M5136 Other intervertebral disc degeneration, lumbar region: Secondary | ICD-10-CM

## 2017-12-17 ENCOUNTER — Other Ambulatory Visit: Payer: Self-pay

## 2017-12-18 ENCOUNTER — Encounter: Payer: Self-pay | Admitting: Radiation Oncology

## 2017-12-18 ENCOUNTER — Other Ambulatory Visit: Payer: Self-pay

## 2017-12-18 ENCOUNTER — Ambulatory Visit
Admission: RE | Admit: 2017-12-18 | Discharge: 2017-12-18 | Disposition: A | Discharge status: Home / Self Care | Payer: Medicare HMO | Source: Ambulatory Visit | Attending: Radiation Oncology | Admitting: Radiation Oncology

## 2017-12-18 ENCOUNTER — Inpatient Hospital Stay: Payer: Medicare HMO

## 2017-12-18 VITALS — BP 129/81 | HR 93 | Temp 98.6°F | Resp 12 | Ht 65.0 in | Wt 153.4 lb

## 2017-12-18 DIAGNOSIS — C184 Malignant neoplasm of transverse colon: Secondary | ICD-10-CM | POA: Diagnosis not present

## 2017-12-18 MED ORDER — HYDROCODONE-ACETAMINOPHEN 7.5-325 MG PO TABS: 1.0000 | ORAL_TABLET | Freq: Four times a day (QID) | ORAL | 0 refills | Status: DC | PRN

## 2017-12-18 NOTE — Progress Notes (Signed)
Radiation Oncology Follow up Note  Name: Kimberly [name]   Date:   12/18/2017 MRN:  [id number] DOB: Nov 14, 1945    This 72 y.o. 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  REFERRING PROVIDER: Birdie Sons, MD  HPI: patient is a 72 year old female.originally planned to undergo radiation therapy to abdominal field free T4 adenocarcinoma the transverse colon. We did initial simulation on the patient although sheunderwent hernia repair and colostomy revision with plan for top probable takedown of her colostomy after completion of radiation. She is now about 3 weeks out from her surgery and is doing well. Area is well-healed at this point. She specifically denies abdominal pain.  COMPLICATIONS OF TREATMENT: none  FOLLOW UP COMPLIANCE: keeps appointments   PHYSICAL EXAM:  BP 129/81 (BP Location: Left Arm, Patient Position: Sitting, Cuff Size: Normal)   Pulse 93   Temp 98.6 F (37 C) (Tympanic)   Resp 12   Ht 5\' 5"  (1.651 m)   Wt 153 lb 7 oz (69.6 kg)   BMI 25.53 kg/m  Colostomy appears functioning no evidence of skin infection or leakage. Abdomen is otherwise benign. Well-developed well-nourished patient in NAD. HEENT reveals PERLA, EOMI, discs not visualized.  Oral cavity is clear. No oral mucosal lesions are identified. Neck is clear without evidence of cervical or supraclavicular adenopathy. Lungs are clear to A&P. Cardiac examination is essentially unremarkable with regular rate and rhythm without murmur rub or thrill. Abdomen is benign with no organomegaly or masses noted. Motor sensory and DTR levels are equal and symmetric in the upper and lower extremities. Cranial nerves II through XII are grossly intact. Proprioception is intact. No peripheral adenopathy or edema is identified. No motor or  sensory levels are noted. Crude visual fields are within normal range.  RADIOLOGY RESULTS: no current films for review  PLAN: at this time I to patient for recently simulation next week we'll compare to original simulation fields to make sure there isn't been no change or shift in her anatomy. Patient Plans her treatment plan well. Hopefully we can start her radiation shortly thereafter.  I would like to take this opportunity to thank you for allowing me to participate in the care of your patient.Noreene Filbert, MD

## 2017-12-22 ENCOUNTER — Telehealth: Payer: Self-pay

## 2017-12-22 NOTE — Telephone Encounter (Signed)
Called pt to schedule AWV and pt states that she currently has a lot going on with colon cancer and she would prefer not to complete the AWV at this time. Stated understanding and advised pt to CB and schedule if she changes her mind.  -MM

## 2017-12-24 ENCOUNTER — Ambulatory Visit
Admission: RE | Admit: 2017-12-24 | Discharge: 2017-12-24 | Disposition: A | Discharge status: Home / Self Care | Payer: Medicare HMO | Source: Ambulatory Visit | Attending: Radiation Oncology | Admitting: Radiation Oncology

## 2017-12-24 DIAGNOSIS — Z510 Encounter for antineoplastic radiation therapy: Secondary | ICD-10-CM | POA: Diagnosis not present

## 2017-12-24 DIAGNOSIS — C184 Malignant neoplasm of transverse colon: Secondary | ICD-10-CM | POA: Diagnosis not present

## 2017-12-25 ENCOUNTER — Inpatient Hospital Stay: Payer: Medicare HMO

## 2017-12-25 DIAGNOSIS — C184 Malignant neoplasm of transverse colon: Secondary | ICD-10-CM | POA: Diagnosis not present

## 2017-12-25 DIAGNOSIS — Z510 Encounter for antineoplastic radiation therapy: Secondary | ICD-10-CM | POA: Diagnosis not present

## 2017-12-25 DIAGNOSIS — C779 Secondary and unspecified malignant neoplasm of lymph node, unspecified: Secondary | ICD-10-CM | POA: Diagnosis not present

## 2017-12-25 DIAGNOSIS — Z87891 Personal history of nicotine dependence: Secondary | ICD-10-CM | POA: Diagnosis not present

## 2017-12-26 ENCOUNTER — Other Ambulatory Visit: Payer: Self-pay | Admitting: *Deleted

## 2017-12-30 ENCOUNTER — Ambulatory Visit
Admission: RE | Admit: 2017-12-30 | Discharge: 2017-12-30 | Disposition: A | Discharge status: Home / Self Care | Payer: Medicare HMO | Source: Ambulatory Visit | Attending: Radiation Oncology | Admitting: Radiation Oncology

## 2017-12-30 DIAGNOSIS — Z510 Encounter for antineoplastic radiation therapy: Secondary | ICD-10-CM | POA: Insufficient documentation

## 2017-12-30 DIAGNOSIS — C184 Malignant neoplasm of transverse colon: Secondary | ICD-10-CM | POA: Insufficient documentation

## 2017-12-30 DIAGNOSIS — C779 Secondary and unspecified malignant neoplasm of lymph node, unspecified: Secondary | ICD-10-CM | POA: Diagnosis not present

## 2017-12-30 DIAGNOSIS — Z87891 Personal history of nicotine dependence: Secondary | ICD-10-CM | POA: Diagnosis not present

## 2017-12-31 ENCOUNTER — Ambulatory Visit
Admission: RE | Admit: 2017-12-31 | Discharge: 2017-12-31 | Disposition: A | Discharge status: Home / Self Care | Payer: Medicare HMO | Source: Ambulatory Visit | Attending: Radiation Oncology | Admitting: Radiation Oncology

## 2017-12-31 DIAGNOSIS — Z510 Encounter for antineoplastic radiation therapy: Secondary | ICD-10-CM | POA: Diagnosis not present

## 2017-12-31 DIAGNOSIS — C184 Malignant neoplasm of transverse colon: Secondary | ICD-10-CM | POA: Diagnosis not present

## 2017-12-31 DIAGNOSIS — Z87891 Personal history of nicotine dependence: Secondary | ICD-10-CM | POA: Diagnosis not present

## 2017-12-31 DIAGNOSIS — C779 Secondary and unspecified malignant neoplasm of lymph node, unspecified: Secondary | ICD-10-CM | POA: Diagnosis not present

## 2018-01-01 ENCOUNTER — Inpatient Hospital Stay: Payer: Medicare HMO

## 2018-01-01 ENCOUNTER — Ambulatory Visit
Admission: RE | Admit: 2018-01-01 | Discharge: 2018-01-01 | Disposition: A | Discharge status: Home / Self Care | Payer: Medicare HMO | Source: Ambulatory Visit | Attending: Radiation Oncology | Admitting: Radiation Oncology

## 2018-01-01 DIAGNOSIS — Z87891 Personal history of nicotine dependence: Secondary | ICD-10-CM | POA: Diagnosis not present

## 2018-01-01 DIAGNOSIS — Z510 Encounter for antineoplastic radiation therapy: Secondary | ICD-10-CM | POA: Diagnosis not present

## 2018-01-01 DIAGNOSIS — C779 Secondary and unspecified malignant neoplasm of lymph node, unspecified: Secondary | ICD-10-CM | POA: Diagnosis not present

## 2018-01-01 DIAGNOSIS — C184 Malignant neoplasm of transverse colon: Secondary | ICD-10-CM | POA: Diagnosis not present

## 2018-01-02 ENCOUNTER — Ambulatory Visit: Payer: Medicare HMO

## 2018-01-05 ENCOUNTER — Ambulatory Visit
Admission: RE | Admit: 2018-01-05 | Discharge: 2018-01-05 | Disposition: A | Discharge status: Home / Self Care | Payer: Medicare HMO | Source: Ambulatory Visit | Attending: Radiation Oncology | Admitting: Radiation Oncology

## 2018-01-05 DIAGNOSIS — Z87891 Personal history of nicotine dependence: Secondary | ICD-10-CM | POA: Diagnosis not present

## 2018-01-05 DIAGNOSIS — C779 Secondary and unspecified malignant neoplasm of lymph node, unspecified: Secondary | ICD-10-CM | POA: Diagnosis not present

## 2018-01-05 DIAGNOSIS — C184 Malignant neoplasm of transverse colon: Secondary | ICD-10-CM | POA: Diagnosis not present

## 2018-01-05 DIAGNOSIS — Z510 Encounter for antineoplastic radiation therapy: Secondary | ICD-10-CM | POA: Diagnosis not present

## 2018-01-06 ENCOUNTER — Ambulatory Visit
Admission: RE | Admit: 2018-01-06 | Discharge: 2018-01-06 | Disposition: A | Discharge status: Home / Self Care | Payer: Medicare HMO | Source: Ambulatory Visit | Attending: Radiation Oncology | Admitting: Radiation Oncology

## 2018-01-06 DIAGNOSIS — Z87891 Personal history of nicotine dependence: Secondary | ICD-10-CM | POA: Diagnosis not present

## 2018-01-06 DIAGNOSIS — C184 Malignant neoplasm of transverse colon: Secondary | ICD-10-CM | POA: Diagnosis not present

## 2018-01-06 DIAGNOSIS — C779 Secondary and unspecified malignant neoplasm of lymph node, unspecified: Secondary | ICD-10-CM | POA: Diagnosis not present

## 2018-01-06 DIAGNOSIS — Z510 Encounter for antineoplastic radiation therapy: Secondary | ICD-10-CM | POA: Diagnosis not present

## 2018-01-07 ENCOUNTER — Inpatient Hospital Stay (HOSPITAL_BASED_OUTPATIENT_CLINIC_OR_DEPARTMENT_OTHER): Payer: Medicare HMO | Admitting: Oncology

## 2018-01-07 ENCOUNTER — Encounter: Payer: Self-pay | Admitting: Oncology

## 2018-01-07 ENCOUNTER — Inpatient Hospital Stay: Payer: Medicare HMO | Attending: Radiation Oncology

## 2018-01-07 ENCOUNTER — Ambulatory Visit
Admission: RE | Admit: 2018-01-07 | Discharge: 2018-01-07 | Disposition: A | Discharge status: Home / Self Care | Payer: Medicare HMO | Source: Ambulatory Visit | Attending: Radiation Oncology | Admitting: Radiation Oncology

## 2018-01-07 VITALS — BP 130/83 | HR 76 | Temp 98.3°F | Resp 18 | Wt 156.0 lb

## 2018-01-07 DIAGNOSIS — Z87891 Personal history of nicotine dependence: Secondary | ICD-10-CM

## 2018-01-07 DIAGNOSIS — Z853 Personal history of malignant neoplasm of breast: Secondary | ICD-10-CM | POA: Insufficient documentation

## 2018-01-07 DIAGNOSIS — L089 Local infection of the skin and subcutaneous tissue, unspecified: Secondary | ICD-10-CM | POA: Diagnosis not present

## 2018-01-07 DIAGNOSIS — Z923 Personal history of irradiation: Secondary | ICD-10-CM

## 2018-01-07 DIAGNOSIS — Z800 Family history of malignant neoplasm of digestive organs: Secondary | ICD-10-CM | POA: Insufficient documentation

## 2018-01-07 DIAGNOSIS — C184 Malignant neoplasm of transverse colon: Secondary | ICD-10-CM | POA: Diagnosis not present

## 2018-01-07 DIAGNOSIS — Z452 Encounter for adjustment and management of vascular access device: Secondary | ICD-10-CM | POA: Insufficient documentation

## 2018-01-07 DIAGNOSIS — C779 Secondary and unspecified malignant neoplasm of lymph node, unspecified: Secondary | ICD-10-CM | POA: Diagnosis not present

## 2018-01-07 DIAGNOSIS — Z510 Encounter for antineoplastic radiation therapy: Secondary | ICD-10-CM | POA: Diagnosis not present

## 2018-01-07 DIAGNOSIS — Z95828 Presence of other vascular implants and grafts: Secondary | ICD-10-CM

## 2018-01-07 LAB — CBC
HCT: 40.1 % (ref 35.0–47.0)
Hemoglobin: 13.9 g/dL (ref 12.0–16.0)
MCH: 33.5 pg (ref 26.0–34.0)
MCHC: 34.7 g/dL (ref 32.0–36.0)
MCV: 96.6 fL (ref 80.0–100.0)
PLATELETS: 413 10*3/uL (ref 150–440)
RBC: 4.15 MIL/uL (ref 3.80–5.20)
RDW: 14.3 % (ref 11.5–14.5)
WBC: 10.8 10*3/uL (ref 3.6–11.0)

## 2018-01-07 NOTE — Progress Notes (Signed)
[facility] Follow up  Visit:  Patient Care Team: Birdie Sons, MD as PCP - General (Family Medicine)  PURPOSE OF VISIT: Adjuvant chemotherapy for colon cancer  HISTORY OF PRESENTING ILLNESS: Kimberly [name] 72 y.o. female with PMH listed as below presents for follow up for the management of Stage IIIB Colon Cancer Patient reports a remote history of breast cancer s/p lumpectomy and radiation treatments. Patient had been having abdominal pain, weight loss and bloating.  CT scan showed partial obstruction at the level of transverse colon and ileocolic mesenteric adenopathy. She was prepared for a colonoscopy on 04/15/2017. She started to have worsened abdominal pain, nausea vomiting, unable to maintain oral intake and was sent to ED. She was found to have bowel obstruction and underwent  exploratory laparotomy with transverse colectomy, with end colostomy and mucous fistula formation for obstructing colon lesion. Differential diagnosis prior to surgery was metastatic breast cancer in colon vs colon cancer. The patient did have a colonoscopy 2 years ago without the mass being present at that time but did have 2 small polyps in the transverse colon that were removed.  04/15/2017 Patient underwent transverse colon resection.  Pathology showed T4aN1 moderate differentiated adenocarcinoma, negative margin, 3/16 lymph nodes involved with cancer. Low probability of MSI-H.   # Genetic test INVITAE came back negative. No pathogenetic sequence variants or deletions/duplications identified. Results was scanned to [facility] (a copy of the report was provided to patient and her daughter)  # # baseline CEA is 2.1  TREATMENT: adjuvant FOLFOX, Oxaliplatin was discontinued after 3 cycles due to worsening of pre-existing neuropathy. Completed 9 cycles of 5-FU.   INTERVAL HISTORY Patient presented for lab work today and she complains feeling a "notch" around her medi port and wants to be checked.   Patient reports having hospitalization due to repair of parastomal hernia with biological mesh at the beginning of March. She has been started on radiation. Denies any fever chills, nausea or vomiting. No discharge or swelling or tenderness from medi port site. Appetite is good.   . Review of Systems  Constitutional: Positive for fatigue. Negative for chills, fever and unexpected weight change.  HENT:   Negative for hearing loss, lump/mass and nosebleeds.   Eyes: Negative for eye problems.  Respiratory: Negative for chest tightness, cough and shortness of breath.   Cardiovascular: Negative for chest pain and leg swelling.  Gastrointestinal: Negative for abdominal distention, abdominal pain, constipation and nausea.       Ostomy bag with stool output.   Endocrine: Negative for hot flashes.  Genitourinary: Negative for difficulty urinating, dysuria and frequency.   Musculoskeletal: Negative for arthralgias, gait problem and myalgias.  Skin: Negative for rash and wound.  Neurological: Negative for dizziness, gait problem and headaches.       Chronic numbness and tingling of fingertips and toes.  Hematological: Negative for adenopathy. Does not bruise/bleed easily.  Psychiatric/Behavioral: Negative for confusion, decreased concentration and depression. The patient is not nervous/anxious.       MEDICAL HISTORY: Past Medical History:  Diagnosis Date  . Breast cancer ([place])   . Breast cancer, left ([facility])    Lumpectomy and rad tx's.  . Chronic back pain   . Chronic knee pain   . Colon cancer ([place])   . Degenerative disc disease, lumbar 04/2013  . Dyspnea   . Family history of adverse reaction to anesthesia    son arrested after anesthesia  . GERD (gastroesophageal reflux disease)   .  Hyperlipidemia   . Iron deficiency anemia 05/27/2017  . Neuropathy   . Osteoarthritis    of knee  . Osteoporosis   . Tobacco abuse   . Vitamin D deficiency     SURGICAL HISTORY: Past Surgical History:   Procedure Laterality Date  . APPENDECTOMY  1965  . BREAST EXCISIONAL BIOPSY Left 08/01/2003   lumpectomy rad 11/04-2/28/2005  . CESAREAN SECTION     x3  . COLON SURGERY    . COLONOSCOPY W/ POLYPECTOMY    . ESOPHAGOGASTRODUODENOSCOPY (EGD) WITH PROPOFOL N/A 04/04/2017   Procedure: ESOPHAGOGASTRODUODENOSCOPY (EGD) WITH PROPOFOL;  Surgeon: [name], MD;  Location: [facility];  Service: Endoscopy;  Laterality: N/A;  . HIP FRACTURE SURGERY Left 01/25/2012  . LAPAROTOMY N/A 04/15/2017   Procedure: EXPLORATORY LAPAROTOMY;  Surgeon: Clayburn Pert, MD;  Location: [facility];  Service: General;  Laterality: N/A;  . NECK SURGERY  12/2011  . PARASTOMAL HERNIA REPAIR N/A 12/03/2017   Procedure: HERNIA REPAIR PARASTOMAL;  Surgeon: Jules Husbands, MD;  Location: [facility];  Service: General;  Laterality: N/A;  . PORTACATH PLACEMENT Right 05/21/2017   Procedure: INSERTION PORT-A-CATH;  Surgeon: Clayburn Pert, MD;  Location: [facility];  Service: General;  Laterality: Right;  . WRIST FRACTURE SURGERY      SOCIAL HISTORY: Social History   Socioeconomic History  . Marital status: Divorced    Spouse name: Not on file  . Number of children: Not on file  . Years of education: Not on file  . Highest education level: Not on file  Occupational History  . Not on file  Social Needs  . Financial resource strain: Not on file  . Food insecurity:    Worry: Not on file    Inability: Not on file  . Transportation needs:    Medical: Not on file    Non-medical: Not on file  Tobacco Use  . Smoking status: Former Smoker    Packs/day: 0.25    Years: 55.00    Pack years: 13.75    Types: Cigarettes    Last attempt to quit: 05/21/2017    Years since quitting: 0.6  . Smokeless tobacco: Never Used  . Tobacco comment: started age 11 1/2 to 2 ppd  Substance and Sexual Activity  . Alcohol use: Yes    Alcohol/week: 0.0 oz    Comment: occasionally drinks beer  . Drug use: No  . Sexual activity: Never   Lifestyle  . Physical activity:    Days per week: Not on file    Minutes per session: Not on file  . Stress: Not on file  Relationships  . Social connections:    Talks on phone: Not on file    Gets together: Not on file    Attends religious service: Not on file    Active member of club or organization: Not on file    Attends meetings of clubs or organizations: Not on file    Relationship status: Not on file  . Intimate partner violence:    Fear of current or ex partner: Not on file    Emotionally abused: Not on file    Physically abused: Not on file    Forced sexual activity: Not on file  Other Topics Concern  . Not on file  Social History Narrative  . Not on file    FAMILY HISTORY Family History  Problem Relation Age of Onset  . Diabetes Mother        type 2  . Coronary  artery disease Mother   . Deep vein thrombosis Mother   . Colon cancer Father   . Asthma Brother   . Diabetes Brother        type 2    ALLERGIES:  is allergic to betadine [povidone iodine]; other; and sinus formula [cholestatin].  MEDICATIONS:  Current Outpatient Medications  Medication Sig Dispense Refill  . budesonide-formoterol (SYMBICORT) 80-4.5 MCG/ACT inhaler Inhale 2 puffs into the lungs 2 (two) times daily.    . Calcium Citrate-Vitamin D (CITRACAL/VITAMIN D PO) Take 1 tablet by mouth daily.     . chlorhexidine (PERIDEX) 0.12 % solution USE AS DIRECTED 15 MLS IN THE MOUTH OR THROAT 2 (TWO) TIMES DAILY. SWISH AND SPIT 473 mL 0  . Cyanocobalamin (VITAMIN B 12 PO) Take 1 tablet by mouth daily.     . feeding supplement (BOOST / RESOURCE BREEZE) LIQD Take 1 Container by mouth 3 (three) times daily between meals. 30 Container 0  . gabapentin (NEURONTIN) 400 MG capsule TAKE 1 CAPSULE TWICE DAILY 180 capsule 4  . HYDROcodone-acetaminophen (NORCO) 7.5-325 MG tablet Take 1-2 tablets by mouth every 6 (six) hours as needed for moderate pain. 120 tablet 0  . HYDROcodone-acetaminophen (NORCO/VICODIN) 5-325  MG tablet Take 1 tablet by mouth every 6 (six) hours as needed for moderate pain. 20 tablet 0  . ibuprofen (ADVIL,MOTRIN) 200 MG tablet Take 400 mg by mouth daily as needed for headache or moderate pain.    Marland Kitchen lidocaine-prilocaine (EMLA) cream Apply to affected area once 30 g 3  . LORazepam (ATIVAN) 0.5 MG tablet Take 1 tablet (0.5 mg total) by mouth every 6 (six) hours as needed (Nausea or vomiting). 30 tablet 0  . magnesium chloride (SLOW-MAG) 64 MG TBEC SR tablet Take 1 tablet by mouth daily.    . meloxicam (MOBIC) 15 MG tablet TAKE 1 TABLET EVERY DAY AS NEEDED 90 tablet 4  . omeprazole (PRILOSEC) 20 MG capsule TAKE 1 CAPSULE EVERY DAY 90 capsule 4  . ondansetron (ZOFRAN) 8 MG tablet Take 1 tablet (8 mg total) by mouth 2 (two) times daily as needed for refractory nausea / vomiting. Start on day 3 after chemotherapy. 30 tablet 1  . pravastatin (PRAVACHOL) 40 MG tablet TAKE 1 TABLET (40 MG TOTAL) BY MOUTH DAILY. 90 tablet 4  . Probiotic Product (PROBIOTIC DAILY PO) Take 1 capsule by mouth daily.     . prochlorperazine (COMPAZINE) 10 MG tablet Take 1 tablet (10 mg total) by mouth every 6 (six) hours as needed (Nausea or vomiting). 30 tablet 1  . pyridOXINE (VITAMIN B-6) 50 MG tablet Take 1 tablet (50 mg total) by mouth daily. 30 tablet 3  . simethicone (MYLICON) 80 MG chewable tablet Chew 2 tablets (160 mg total) by mouth every 6 (six) hours as needed for flatulence. 120 tablet 0  . Triamcinolone Acetonide (NASACORT ALLERGY 24HR NA) Place 1 spray into the nose daily as needed (allergies).     No current facility-administered medications for this visit.    Facility-Administered Medications Ordered in Other Visits  Medication Dose Route Frequency Provider Last Rate Last Dose  . 0.9 %  sodium chloride infusion   Intravenous Continuous Earlie Server, MD   Stopped at 07/22/17 1150  . heparin lock flush 100 unit/mL  500 Units Intravenous Once Earlie Server, MD      . heparin lock flush 100 unit/mL  500 Units  Intravenous Once Earlie Server, MD      . heparin lock flush 100 unit/mL  500 Units Intravenous Once Earlie Server, MD      . sodium chloride flush (NS) 0.9 % injection 10 mL  10 mL Intracatheter PRN Earlie Server, MD      . sodium chloride flush (NS) 0.9 % injection 10 mL  10 mL Intravenous PRN Earlie Server, MD   10 mL at 05/29/17 1347    PHYSICAL EXAMINATION:  ECOG PERFORMANCE STATUS: 0 - Asymptomatic  Vitals:   01/07/18 1219  BP: 130/83  Pulse: 76  Resp: 18  Temp: 98.3 F (36.8 C)   Filed Weights   01/07/18 1219  Weight: 156 lb (70.8 kg)    Physical Exam  Constitutional: She is oriented to person, place, and time and well-developed, well-nourished, and in no distress. No distress.  HENT:  Head: Normocephalic and atraumatic.  Mouth/Throat: Oropharynx is clear and moist. No oropharyngeal exudate.  Eyes: Pupils are equal, round, and reactive to light. Conjunctivae and EOM are normal. Left eye exhibits no discharge. No scleral icterus.  Neck: Normal range of motion. Neck supple. No JVD present.  Cardiovascular: Regular rhythm and normal heart sounds.  No murmur heard. Pulmonary/Chest: Effort normal and breath sounds normal. No respiratory distress. She has no wheezes.  Abdominal: Soft. Bowel sounds are normal. She exhibits no distension and no mass. There is no tenderness. There is no rebound.  (+)Colostomy  Musculoskeletal: Normal range of motion. She exhibits no edema.  Lymphadenopathy:    She has no cervical adenopathy.  Neurological: She is alert and oriented to person, place, and time. No cranial nerve deficit. Gait normal.  Skin: Skin is warm and dry. No erythema.  Right anterior chest wall med port no local erythema, swelling to discharge. Patient points area of concern which is actually catheter   Psychiatric: Affect and judgment normal.      LABORATORY DATA: I have personally reviewed the data as listed: CBC Latest Ref Rng & Units 01/07/2018 12/04/2017 12/02/2017  WBC 3.6 - 11.0 K/uL  10.8 22.2(H) 15.2(H)  Hemoglobin 12.0 - 16.0 g/dL 13.9 11.8(L) 13.7  Hematocrit 35.0 - 47.0 % 40.1 35.6 40.5  Platelets 150 - 440 K/uL 413 314 345   CMP Latest Ref Rng & Units 12/04/2017 12/02/2017 12/01/2017  Glucose 65 - 99 mg/dL 111(H) 88 99  BUN 6 - 20 mg/dL _0 Creatinine 0.44 - 1.00 mg/dL 0.58 0.71 0.76  Sodium 135 - 145 mmol/L 139 139 137  Potassium 3.5 - 5.1 mmol/L 3.9 3.9 4.0  Chloride 101 - 111 mmol/L 103 101 103  CO2 22 - 32 mmol/L _1 Calcium 8.9 - 10.3 mg/dL 8.1(L) 8.6(L) 8.6(L)  Total Protein 6.5 - 8.1 g/dL - - 7.2  Total Bilirubin 0.3 - 1.2 mg/dL - - 0.8  Alkaline Phos 38 - 126 U/L - - 95  AST 15 - 41 U/L - - 21  ALT 14 - 54 U/L - - 26    RADIOGRAPHIC STUDIES: I have personally reviewed the radiological images as listed and agree with the findings in the report 04/08/2017 CT abdomen pelvis w contrast IMPRESSION: 1. Partial obstruction at the level of the transverse colon, likely secondary to carcinoma. 2. Ileocolic mesenteric adenopathy, suspicious for nodal metastasis. 3.  Aortic Atherosclerosis (ICD10-I70.0). 4. Bilateral adrenal adenomas. 04/21/2017 CT abdomen pelvis with contrast. IMPRESSION: Postsurgical changes consistent with the given clinical history. A small amount of fluid in a year is noted surrounding the right lower quadrant ostomy likely felt to be postoperative in nature. No definitive  abscess is seen. The patient's palpable abnormality likely corresponds to the small fluid collection.  PATHOLOGY DIAGNOSIS:  A. COLON MASS, TRANSVERSE; TRANSVERSE COLECTOMY:  - INVASIVE ADENOCARCINOMA, MODERATELY DIFFERENTIATED.  - THREE OF SIXTEEN LYMPH NODES INVOLVED BY METASTASIS (3/16).  - LYMPHOVASCULAR AND PERINEURAL INVASION PRESENT.  - SEE SUMMARY BELOW.  Surgical Pathology Cancer Case Summary  COLON AND RECTUM:  Procedure: transverse colectomy  Tumor Site:  transverse colon  Tumor Size: Greatest dimension: 2.3 cm  Macroscopic Tumor Perforation:  not specified  Histologic Type: adenocarcinoma  Histologic Grade: G2, moderately differentiated  Tumor Extension: tumor invades the visceral peritoneum  Margins: all margins are uninvolved by invasive carcinoma, high-grade dysplasia, intramucosal adenocarcinoma, and adenoma  Treatment Effect: no known presurgical therapy  Lymphovascular Invasion: present  Perineural Invasion: present  Tumor Deposits: not identified  Regional Lymph Nodes: # examined: 16  # involved: 3  Pathologic Stage Classification (pTNM, AJCC 8th Edition): pT4a pN1b   ADDENDUM:   MICROSATELLITE INSTABILITY IMMUNOHISTOCHEMISTRY  MISMATCH REPAIR PROTEINS:   MLH1: Intact nuclear expression  MSH2: Intact nuclear expression  MSH6: Intact nuclear expression  PMS2: Intact nuclear expression   Interpretation: No loss of nuclear expression of mismatch repair  proteins: Low probability of MSI-H.    ASSESSMENT/PLAN 72yo female who has MSI  stable stage IIIB Colon Cancer finishing adjuvant 5-FU   Cancer Staging Malignant neoplasm of transverse colon ([place]) Staging form: Colon and Rectum, AJCC 8th Edition - Clinical stage from 04/15/2017: Stage IIIB (cT4a, cN1b, cM0) - Signed by Earlie Server, MD on 04/26/2017  1. Port-A-Cath in place   2. Malignant neoplasm of transverse colon [facility])   Discussed with patient that the area of concern appears to be the catheter. There is no clinical sign of infection.  She has not had port flushed since march. Will set up her port flush Q6-8 weeks.  Follow up with Radiation Oncology to finish her adjuvant radiation.    Follow up in  6 weeks. Repeat labs.   Patient knows to call if any questions or concerns.  Earlie Server, MD, PhD Hematology Oncology [facility] Pager- [phone] 01/07/18

## 2018-01-07 NOTE — Progress Notes (Signed)
Pt in for unscheduled visit.  RN in lab was having difficulty flushing and obtaining blood today.  Reports port a cath "may have a kinked in it".

## 2018-01-07 NOTE — Addendum Note (Signed)
Addended by: Earlie Server on: 01/07/2018 08:59 PM   Modules accepted: Orders

## 2018-01-08 ENCOUNTER — Ambulatory Visit
Admission: RE | Admit: 2018-01-08 | Discharge: 2018-01-08 | Disposition: A | Discharge status: Home / Self Care | Payer: Medicare HMO | Source: Ambulatory Visit | Attending: Radiation Oncology | Admitting: Radiation Oncology

## 2018-01-08 ENCOUNTER — Inpatient Hospital Stay: Payer: Medicare HMO

## 2018-01-08 DIAGNOSIS — Z853 Personal history of malignant neoplasm of breast: Secondary | ICD-10-CM | POA: Diagnosis not present

## 2018-01-08 DIAGNOSIS — C184 Malignant neoplasm of transverse colon: Secondary | ICD-10-CM | POA: Diagnosis not present

## 2018-01-08 DIAGNOSIS — Z87891 Personal history of nicotine dependence: Secondary | ICD-10-CM | POA: Diagnosis not present

## 2018-01-08 DIAGNOSIS — Z800 Family history of malignant neoplasm of digestive organs: Secondary | ICD-10-CM | POA: Diagnosis not present

## 2018-01-08 DIAGNOSIS — C779 Secondary and unspecified malignant neoplasm of lymph node, unspecified: Secondary | ICD-10-CM | POA: Diagnosis not present

## 2018-01-08 DIAGNOSIS — L089 Local infection of the skin and subcutaneous tissue, unspecified: Secondary | ICD-10-CM | POA: Diagnosis not present

## 2018-01-08 DIAGNOSIS — Z923 Personal history of irradiation: Secondary | ICD-10-CM | POA: Diagnosis not present

## 2018-01-08 DIAGNOSIS — Z452 Encounter for adjustment and management of vascular access device: Secondary | ICD-10-CM | POA: Diagnosis not present

## 2018-01-08 DIAGNOSIS — Z510 Encounter for antineoplastic radiation therapy: Secondary | ICD-10-CM | POA: Diagnosis not present

## 2018-01-08 DIAGNOSIS — Z95828 Presence of other vascular implants and grafts: Secondary | ICD-10-CM

## 2018-01-08 MED ORDER — SODIUM CHLORIDE 0.9% FLUSH
10.0000 mL | INTRAVENOUS | Status: AC | PRN
  Administered 2018-01-08: 10 mL
  Filled 2018-01-08: qty 10

## 2018-01-08 MED ORDER — HEPARIN SOD (PORK) LOCK FLUSH 100 UNIT/ML IV SOLN
500.0000 [IU] | INTRAVENOUS | Status: AC | PRN
  Administered 2018-01-08: 500 [IU]

## 2018-01-09 ENCOUNTER — Ambulatory Visit
Admission: RE | Admit: 2018-01-09 | Discharge: 2018-01-09 | Disposition: A | Discharge status: Home / Self Care | Payer: Medicare HMO | Source: Ambulatory Visit | Attending: Radiation Oncology | Admitting: Radiation Oncology

## 2018-01-09 DIAGNOSIS — Z510 Encounter for antineoplastic radiation therapy: Secondary | ICD-10-CM | POA: Diagnosis not present

## 2018-01-09 DIAGNOSIS — C184 Malignant neoplasm of transverse colon: Secondary | ICD-10-CM | POA: Diagnosis not present

## 2018-01-09 DIAGNOSIS — Z87891 Personal history of nicotine dependence: Secondary | ICD-10-CM | POA: Diagnosis not present

## 2018-01-09 DIAGNOSIS — C779 Secondary and unspecified malignant neoplasm of lymph node, unspecified: Secondary | ICD-10-CM | POA: Diagnosis not present

## 2018-01-12 ENCOUNTER — Ambulatory Visit (INDEPENDENT_AMBULATORY_CARE_PROVIDER_SITE_OTHER): Payer: Medicare HMO | Admitting: Family Medicine

## 2018-01-12 ENCOUNTER — Ambulatory Visit
Admission: RE | Admit: 2018-01-12 | Discharge: 2018-01-12 | Disposition: A | Discharge status: Home / Self Care | Payer: Medicare HMO | Source: Ambulatory Visit | Attending: Radiation Oncology | Admitting: Radiation Oncology

## 2018-01-12 ENCOUNTER — Encounter: Payer: Self-pay | Admitting: Family Medicine

## 2018-01-12 ENCOUNTER — Ambulatory Visit (INDEPENDENT_AMBULATORY_CARE_PROVIDER_SITE_OTHER): Payer: Medicare HMO

## 2018-01-12 VITALS — BP 116/58 | HR 88 | Temp 98.4°F | Ht 65.0 in | Wt 156.0 lb

## 2018-01-12 DIAGNOSIS — E785 Hyperlipidemia, unspecified: Secondary | ICD-10-CM | POA: Diagnosis not present

## 2018-01-12 DIAGNOSIS — Z0000 Encounter for general adult medical examination without abnormal findings: Secondary | ICD-10-CM

## 2018-01-12 DIAGNOSIS — E559 Vitamin D deficiency, unspecified: Secondary | ICD-10-CM | POA: Diagnosis not present

## 2018-01-12 DIAGNOSIS — Z510 Encounter for antineoplastic radiation therapy: Secondary | ICD-10-CM | POA: Diagnosis not present

## 2018-01-12 DIAGNOSIS — C779 Secondary and unspecified malignant neoplasm of lymph node, unspecified: Secondary | ICD-10-CM | POA: Diagnosis not present

## 2018-01-12 DIAGNOSIS — Z860100 Personal history of colon polyps, unspecified: Secondary | ICD-10-CM

## 2018-01-12 DIAGNOSIS — Z1239 Encounter for other screening for malignant neoplasm of breast: Secondary | ICD-10-CM

## 2018-01-12 DIAGNOSIS — Z1231 Encounter for screening mammogram for malignant neoplasm of breast: Secondary | ICD-10-CM

## 2018-01-12 DIAGNOSIS — C184 Malignant neoplasm of transverse colon: Secondary | ICD-10-CM | POA: Diagnosis not present

## 2018-01-12 DIAGNOSIS — Z87891 Personal history of nicotine dependence: Secondary | ICD-10-CM | POA: Diagnosis not present

## 2018-01-12 NOTE — Progress Notes (Signed)
Patient: Kimberly [name], Female    DOB: Nov 05, 1945, 72 y.o.   MRN: [id number] Visit Date: 01/12/2018  Today's Provider: Lelon Huh, MD   Chief Complaint  Patient presents with  . Annual Exam   Subjective:   Patient saw [facility] for AWV today at 3:10 pm.   Complete Physical Kimberly [name] is a 72 y.o. female. She feels fairly well. She reports no regular exercising. She reports she is sleeping fairly well.  ----------------------------------------------------------    Lipid/Cholesterol, Follow-up:   Last seen for this 1 months ago.  Management since that visit includes; labs checked.  Last Lipid Panel:    Component Value Date/Time   CHOL 243 (H) 01/10/2017 1449   TRIG 399 (H) 01/10/2017 1449   HDL 35 (L) 01/10/2017 1449   CHOLHDL 6.9 (H) 01/10/2017 1449   LDLCALC 128 (H) 01/10/2017 1449    She reports good compliance with treatment. She is not having side effects.   Wt Readings from Last 3 Encounters:  01/07/18 156 lb (70.8 kg)  12/18/17 153 lb 7 oz (69.6 kg)  12/10/17 156 lb 6.4 oz (70.9 kg)    ------------------------------------------------------------------------  Vitamin D deficiency From 4/13/12018-labs checked, showing low Vitamin D level. Patient is currently taking OTC Vitamin D supplements, although she is unsure the dosage.     DDD From 01/10/2017-refilled Hydrocodone. Patient reports good compliance with treatment, good tolerance and fair symptom control.   Review of Systems  Constitutional: Positive for diaphoresis and fatigue. Negative for chills and fever.  HENT: Positive for sneezing and tinnitus. Negative for congestion, ear pain, rhinorrhea and sore throat.   Eyes: Negative.  Negative for pain and redness.  Respiratory: Positive for wheezing. Negative for cough and shortness of breath.   Cardiovascular: Negative for chest pain and leg swelling.  Gastrointestinal: Positive for diarrhea and nausea. Negative for abdominal pain, blood  in stool and constipation.  Endocrine: Negative for polydipsia and polyphagia.  Genitourinary: Negative.  Negative for dysuria, flank pain, hematuria, pelvic pain, vaginal bleeding and vaginal discharge.  Musculoskeletal: Negative for arthralgias, back pain, gait problem and joint swelling.  Skin: Positive for rash.  Neurological: Positive for dizziness, light-headedness and headaches. Negative for tremors, seizures, weakness and numbness.  Hematological: Negative for adenopathy. Bruises/bleeds easily.  Psychiatric/Behavioral: Negative.  Negative for behavioral problems, confusion and dysphoric mood. The patient is not nervous/anxious and is not hyperactive.     Social History   Socioeconomic History  . Marital status: Divorced    Spouse name: Not on file  . Number of children: 3  . Years of education: Not on file  . Highest education level: 7th grade  Occupational History  . Occupation: retired  Scientific laboratory technician  . Financial resource strain: Not hard at all  . Food insecurity:    Worry: Never true    Inability: Never true  . Transportation needs:    Medical: No    Non-medical: No  Tobacco Use  . Smoking status: Former Smoker    Packs/day: 0.25    Years: 55.00    Pack years: 13.75    Types: Cigarettes    Last attempt to quit: 05/21/2017    Years since quitting: 0.6  . Smokeless tobacco: Never Used  . Tobacco comment: started age 62 1/3 to 1 ppd  Substance and Sexual Activity  . Alcohol use: Yes    Alcohol/week: 0.0 oz    Comment: occasionally drinks beer  . Drug use: No  . Sexual  activity: Never  Lifestyle  . Physical activity:    Days per week: Not on file    Minutes per session: Not on file  . Stress: Not at all  Relationships  . Social connections:    Talks on phone: Not on file    Gets together: Not on file    Attends religious service: Not on file    Active member of club or organization: Not on file    Attends meetings of clubs or organizations: Not on file     Relationship status: Not on file  . Intimate partner violence:    Fear of current or ex partner: Not on file    Emotionally abused: Not on file    Physically abused: Not on file    Forced sexual activity: Not on file  Other Topics Concern  . Not on file  Social History Narrative  . Not on file    Past Medical History:  Diagnosis Date  . Breast cancer ([place])   . Breast cancer, left ([facility])    Lumpectomy and rad tx's.  . Chronic back pain   . Chronic knee pain   . Colon cancer ([place])   . Degenerative disc disease, lumbar 04/2013  . Dyspnea   . Family history of adverse reaction to anesthesia    son arrested after anesthesia  . GERD (gastroesophageal reflux disease)   . Hyperlipidemia   . Iron deficiency anemia 05/27/2017  . Neuropathy   . Osteoarthritis    of knee  . Osteoporosis   . Tobacco abuse   . Vitamin D deficiency      Patient Active Problem List   Diagnosis Date Noted  . Colostomy prolapse ([place])   . Colon cancer ([place]) 12/01/2017  . Hypokalemia 05/27/2017  . Hypomagnesemia 05/27/2017  . Iron deficiency anemia 05/27/2017  . Port-A-Cath in place   . Left peroneal vein thrombosis ([place])   . Lower urinary tract infection   . Malignant neoplasm of transverse colon ([place]) 04/26/2017  . Cellulitis   . Small bowel obstruction ([place]) 04/16/2017  . Bowel obstruction ([place]) 04/15/2017  . Generalized abdominal pain   . Abdominal pain, generalized   . Loss of weight   . Gastritis without bleeding   . Absence of bladder continence 04/01/2017  . Asthmatic breathing 04/01/2017  . Need for vaccination 04/01/2017  . Abnormal abdominal x-ray 02/11/2017  . Acute bacterial sinusitis 02/11/2017  . OP (osteoporosis) 02/11/2017  . Encounter for general adult medical examination without abnormal findings 02/11/2017  . Skin lesion 02/11/2017  . Screening breast examination 02/11/2017  . Breast swelling 02/11/2017  . Bronchitis 02/11/2017  . Bursitis of hip 02/11/2017  . Cervical  cancer screening 02/11/2017  . Gonalgia 02/11/2017  . Conjunctivitis 02/11/2017  . Narrowing of intervertebral disc space 02/11/2017  . Accumulation of fluid in tissues 02/11/2017  . Family history of colon cancer 02/11/2017  . Foot pain 02/11/2017  . Flu vaccine need 02/11/2017  . Arthralgia of hip 02/11/2017  . Personal history of malignant neoplasm of breast 02/11/2017  . Received influenza vaccination at hospital 02/11/2017  . Eczema intertrigo 02/11/2017  . Neuropathy 02/11/2017  . Osteoarthrosis, unspecified whether generalized or localized, involving lower leg 02/11/2017  . Coronary atherosclerosis 01/29/2017  . Personal history of tobacco use, presenting hazards to health 01/21/2017  . Smoking greater than 30 pack years 01/10/2017  . Vitamin D deficiency 08/21/2016  . Hyperlipidemia 11/10/2015  . Chronic back pain 05/26/2015  . DDD (degenerative disc disease),  lumbosacral 05/26/2015  . History of colon polyps 04/27/2007    Past Surgical History:  Procedure Laterality Date  . APPENDECTOMY  1965  . BREAST EXCISIONAL BIOPSY Left 08/01/2003   lumpectomy rad 11/04-2/28/2005  . CESAREAN SECTION     x3  . COLON SURGERY    . COLONOSCOPY W/ POLYPECTOMY    . ESOPHAGOGASTRODUODENOSCOPY (EGD) WITH PROPOFOL N/A 04/04/2017   Procedure: ESOPHAGOGASTRODUODENOSCOPY (EGD) WITH PROPOFOL;  Surgeon: [name], MD;  Location: [facility];  Service: Endoscopy;  Laterality: N/A;  . HIP FRACTURE SURGERY Left 01/25/2012  . LAPAROTOMY N/A 04/15/2017   Procedure: EXPLORATORY LAPAROTOMY;  Surgeon: Clayburn Pert, MD;  Location: [facility];  Service: General;  Laterality: N/A;  . NECK SURGERY  12/2011  . PARASTOMAL HERNIA REPAIR N/A 12/03/2017   Procedure: HERNIA REPAIR PARASTOMAL;  Surgeon: Jules Husbands, MD;  Location: [facility];  Service: General;  Laterality: N/A;  . PORTACATH PLACEMENT Right 05/21/2017   Procedure: INSERTION PORT-A-CATH;  Surgeon: Clayburn Pert, MD;  Location: [facility]  [facility];  Service: General;  Laterality: Right;  . WRIST FRACTURE SURGERY      Her family history includes Asthma in her brother; Colon cancer in her father; Coronary artery disease in her mother; Deep vein thrombosis in her mother; Diabetes in her brother and mother.      Current Outpatient Medications:  .  budesonide-formoterol (SYMBICORT) 80-4.5 MCG/ACT inhaler, Inhale 2 puffs into the lungs 2 (two) times daily., Disp: , Rfl:  .  Calcium Citrate-Vitamin D (CITRACAL/VITAMIN D PO), Take 1 tablet by mouth daily. , Disp: , Rfl:  .  chlorhexidine (PERIDEX) 0.12 % solution, USE AS DIRECTED 15 MLS IN THE MOUTH OR THROAT 2 (TWO) TIMES DAILY. SWISH AND SPIT, Disp: 473 mL, Rfl: 0 .  Cyanocobalamin (VITAMIN B 12 PO), Take 1 tablet by mouth daily. , Disp: , Rfl:  .  feeding supplement (BOOST / RESOURCE BREEZE) LIQD, Take 1 Container by mouth 3 (three) times daily between meals., Disp: 30 Container, Rfl: 0 .  gabapentin (NEURONTIN) 400 MG capsule, TAKE 1 CAPSULE TWICE DAILY, Disp: 180 capsule, Rfl: 4 .  HYDROcodone-acetaminophen (NORCO) 7.5-325 MG tablet, Take 1-2 tablets by mouth every 6 (six) hours as needed for moderate pain., Disp: 120 tablet, Rfl: 0 .  ibuprofen (ADVIL,MOTRIN) 200 MG tablet, Take 400 mg by mouth daily as needed for headache or moderate pain., Disp: , Rfl:  .  lidocaine-prilocaine (EMLA) cream, Apply to affected area once, Disp: 30 g, Rfl: 3 .  LORazepam (ATIVAN) 0.5 MG tablet, Take 1 tablet (0.5 mg total) by mouth every 6 (six) hours as needed (Nausea or vomiting)., Disp: 30 tablet, Rfl: 0 .  magnesium chloride (SLOW-MAG) 64 MG TBEC SR tablet, Take 1 tablet by mouth daily., Disp: , Rfl:  .  meloxicam (MOBIC) 15 MG tablet, TAKE 1 TABLET EVERY DAY AS NEEDED, Disp: 90 tablet, Rfl: 4 .  omeprazole (PRILOSEC) 20 MG capsule, TAKE 1 CAPSULE EVERY DAY, Disp: 90 capsule, Rfl: 4 .  ondansetron (ZOFRAN) 8 MG tablet, Take 1 tablet (8 mg total) by mouth 2 (two) times daily as needed for refractory  nausea / vomiting. Start on day 3 after chemotherapy., Disp: 30 tablet, Rfl: 1 .  pravastatin (PRAVACHOL) 40 MG tablet, TAKE 1 TABLET (40 MG TOTAL) BY MOUTH DAILY., Disp: 90 tablet, Rfl: 4 .  Probiotic Product (PROBIOTIC DAILY PO), Take 1 capsule by mouth daily. , Disp: , Rfl:  .  prochlorperazine (COMPAZINE) 10 MG tablet, Take 1 tablet (10  mg total) by mouth every 6 (six) hours as needed (Nausea or vomiting)., Disp: 30 tablet, Rfl: 1 .  pyridOXINE (VITAMIN B-6) 50 MG tablet, Take 1 tablet (50 mg total) by mouth daily., Disp: 30 tablet, Rfl: 3 .  simethicone (MYLICON) 80 MG chewable tablet, Chew 2 tablets (160 mg total) by mouth every 6 (six) hours as needed for flatulence., Disp: 120 tablet, Rfl: 0 .  Triamcinolone Acetonide (NASACORT ALLERGY 24HR NA), Place 1 spray into the nose daily as needed (allergies)., Disp: , Rfl:  .  HYDROcodone-acetaminophen (NORCO/VICODIN) 5-325 MG tablet, Take 1 tablet by mouth every 6 (six) hours as needed for moderate pain. (Patient not taking: Reported on 01/12/2018), Disp: 20 tablet, Rfl: 0 No current facility-administered medications for this visit.   Facility-Administered Medications Ordered in Other Visits:  .  0.9 %  sodium chloride infusion, , Intravenous, Continuous, Earlie Server, MD, Stopped at 07/22/17 1150 .  heparin lock flush 100 unit/mL, 500 Units, Intravenous, Once, Earlie Server, MD .  heparin lock flush 100 unit/mL, 500 Units, Intravenous, Once, Earlie Server, MD .  heparin lock flush 100 unit/mL, 500 Units, Intravenous, Once, Earlie Server, MD .  sodium chloride flush (NS) 0.9 % injection 10 mL, 10 mL, Intracatheter, PRN, Earlie Server, MD .  sodium chloride flush (NS) 0.9 % injection 10 mL, 10 mL, Intravenous, PRN, Earlie Server, MD, 10 mL at 05/29/17 1347  Patient Care Team: Birdie Sons, MD as PCP - General (Family Medicine)     Objective:   Vitals: There were no vitals taken for this visit. Vitals:   BP 116/58 (BP Location: Left Arm)    Pulse 88    Temp 98.4  F (36.9 C) (Oral)    Ht 5\' 5"  (1.651 m)    Wt 156 lb (70.8 kg)    BMI 25.96 kg/m    BSA 1.8 m        More Vitals           Physical Exam    General Appearance:    Alert, cooperative, no distress, appears stated age  Head:    Normocephalic, without obvious abnormality, atraumatic  Eyes:    PERRL, conjunctiva/corneas clear, EOM's intact, fundi    benign, both eyes  Ears:    Normal TM's and external ear canals, both ears  Nose:   Nares normal, septum midline, mucosa normal, no drainage    or sinus tenderness  Throat:   Lips, mucosa, and tongue normal; teeth and gums normal  Neck:   Supple, symmetrical, trachea midline, no adenopathy;    thyroid:  no enlargement/tenderness/nodules; no carotid   bruit or JVD  Back:     Symmetric, no curvature, ROM normal, no CVA tenderness  Lungs:     Clear to auscultation bilaterally, respirations unlabored  Chest Wall:    No tenderness or deformity   Heart:    Regular rate and rhythm, S1 and S2 normal, no murmur, rub   or gallop  Breast Exam:    normal appearance, no masses or tenderness  Abdomen:     Soft, non-tender, bowel sounds active all four quadrants,    no masses, no organomegaly  Pelvic:    deferred and not indicated; post-menopausal, no abnormal Pap smears in past  Extremities:   Extremities normal, atraumatic, no cyanosis or edema  Pulses:   2+ and symmetric all extremities  Skin:   Skin color, texture, turgor normal, no rashes or lesions  Lymph nodes:   Cervical, supraclavicular, and  axillary nodes normal  Neurologic:   CNII-XII intact, normal strength, sensation and reflexes    throughout    Activities of Daily Living In your present state of health, do you have any difficulty performing the following activities: 01/12/2018  Hearing? N  Vision? N  Difficulty concentrating or making decisions? Y  Walking or climbing stairs? Y  Comment Due to hip pain.  Dressing or bathing? N  Doing errands, shopping? N    Preparing Food and eating ? N  Using the Toilet? N  In the past six months, have you accidently leaked urine? N  Do you have problems with loss of bowel control? N  Managing your Medications? N  Managing your Finances? N  Housekeeping or managing your Housekeeping? N  Some encounter information is confidential and restricted. Go to Review Flowsheets activity to see all data.  Some recent data might be hidden    Fall Risk Assessment Fall Risk  01/12/2018 10/01/2017 08/07/2017 06/03/2017 01/10/2017  Falls in the past year? No No No No No     Depression Screen PHQ 2/9 Scores 01/10/2017 01/10/2017 07/12/2016  PHQ - 2 Score 0 0 0  PHQ- 9 Score 0 - -    Cognitive Testing - 6-CIT  Cognitive Function: Pt declined screening today.        Assessment & Plan:    Annual Physical Reviewed patient's Family Medical History Reviewed and updated list of patient's medical providers Assessment of cognitive impairment was done Assessed patient's functional ability Established a written schedule for health screening [place] Completed and Reviewed  Exercise Activities and Dietary recommendations Goals    None      Immunization History  Administered Date(s) Administered  . Influenza, High Dose Seasonal PF 07/12/2016, 12/05/2017  . Influenza-Unspecified 08/30/2013  . Pneumococcal Conjugate-13 06/30/2014  . Pneumococcal Polysaccharide-23 03/02/2013, 04/17/2017  . Tdap 12/02/2014  . Zoster 12/02/2014    Health Maintenance  Topic Date Due  . MAMMOGRAM  12/28/2017  . INFLUENZA VACCINE  04/30/2018  . DEXA SCAN  05/08/2019  . COLONOSCOPY  01/05/2020  . TETANUS/TDAP  12/01/2024  . Hepatitis C Screening  Completed  . PNA vac Low Risk Adult  Completed     Discussed health benefits of physical activity, and encouraged her to engage in regular exercise appropriate for her age and condition.     ------------------------------------------------------------------------------------------------------------  1. Annual physical exam No new findings. Nearly finished with radiation for colon cancer.   2. Hyperlipidemia, unspecified hyperlipidemia type She is tolerating pravastatin well with no adverse effects.   - Lipid panel  3. Vitamin D deficiency On  - VITAMIN D 25 Hydroxy (Vit-D Deficiency, Fractures)  4. . Screening breast examination Normal breast exam today. She does not want mammogram as she is still undergoing radiation for colon cancer. She has contact information for [facility].   Advised she should continue annual LDCT for lung ca screening and to expect to here from cancer center about this in the next few months.    Lelon Huh, MD  [facility]

## 2018-01-12 NOTE — Patient Instructions (Addendum)
[name] , Thank you for taking time to come for your Medicare Wellness Visit. I appreciate your ongoing commitment to your health goals. Please review the following plan we discussed and let me know if I can assist you in the future.   Screening recommendations/referrals: Colonoscopy: Up to date Mammogram: Pt declines today, but plans to set this up later this year.  Bone Density: Up to date Recommended yearly ophthalmology/optometry visit for glaucoma screening and checkup Recommended yearly dental visit for hygiene and checkup  Vaccinations: Influenza vaccine: Up to date Pneumococcal vaccine: Up to date Tdap vaccine: Up to date Shingles vaccine: Pt declines today.     Advanced directives: Already on file.  Conditions/risks identified: Recommend eating 3 small meals a day with 2 healthy snack in between.   Next appointment: 3:40 PM today with Dr Caryn Section.    Preventive Care 75 Years and Older, Female Preventive care refers to lifestyle choices and visits with your health care provider that can promote health and wellness. What does preventive care include?  A yearly physical exam. This is also called an annual well check.  Dental exams once or twice a year.  Routine eye exams. Ask your health care provider how often you should have your eyes checked.  Personal lifestyle choices, including:  Daily care of your teeth and gums.  Regular physical activity.  Eating a healthy diet.  Avoiding tobacco and drug use.  Limiting alcohol use.  Practicing safe sex.  Taking low-dose aspirin every day.  Taking vitamin and mineral supplements as recommended by your health care provider. What happens during an annual well check? The services and screenings done by your health care provider during your annual well check will depend on your age, overall health, lifestyle risk factors, and family history of disease. Counseling  Your health care provider may ask you questions about  your:  Alcohol use.  Tobacco use.  Drug use.  Emotional well-being.  Home and relationship well-being.  Sexual activity.  Eating habits.  History of falls.  Memory and ability to understand (cognition).  Work and work Statistician.  Reproductive health. Screening  You may have the following tests or measurements:  Height, weight, and BMI.  Blood pressure.  Lipid and cholesterol levels. These may be checked every 5 years, or more frequently if you are over 61 years old.  Skin check.  Lung cancer screening. You may have this screening every year starting at age 80 if you have a 30-pack-year history of smoking and currently smoke or have quit within the past 15 years.  Fecal occult blood test (FOBT) of the stool. You may have this test every year starting at age 37.  Flexible sigmoidoscopy or colonoscopy. You may have a sigmoidoscopy every 5 years or a colonoscopy every 10 years starting at age 31.  Hepatitis C blood test.  Hepatitis B blood test.  Sexually transmitted disease (STD) testing.  Diabetes screening. This is done by checking your blood sugar (glucose) after you have not eaten for a while (fasting). You may have this done every 1-3 years.  Bone density scan. This is done to screen for osteoporosis. You may have this done starting at age 59.  Mammogram. This may be done every 1-2 years. Talk to your health care provider about how often you should have regular mammograms. Talk with your health care provider about your test results, treatment options, and if necessary, the need for more tests. Vaccines  Your health care provider may recommend certain  vaccines, such as:  Influenza vaccine. This is recommended every year.  Tetanus, diphtheria, and acellular pertussis (Tdap, Td) vaccine. You may need a Td booster every 10 years.  Zoster vaccine. You may need this after age 31.  Pneumococcal 13-valent conjugate (PCV13) vaccine. One dose is recommended  after age 42.  Pneumococcal polysaccharide (PPSV23) vaccine. One dose is recommended after age 57. Talk to your health care provider about which screenings and vaccines you need and how often you need them. This information is not intended to replace advice given to you by your health care provider. Make sure you discuss any questions you have with your health care provider. Document Released: 10/13/2015 Document Revised: 06/05/2016 Document Reviewed: 07/18/2015 Elsevier Interactive Patient Education  2017 [place] Prevention in the Home Falls can cause injuries. They can happen to people of all ages. There are many things you can do to make your home safe and to help prevent falls. What can I do on the outside of my home?  Regularly fix the edges of walkways and driveways and fix any cracks.  Remove anything that might make you trip as you walk through a door, such as a raised step or threshold.  Trim any bushes or trees on the path to your home.  Use bright outdoor lighting.  Clear any walking paths of anything that might make someone trip, such as rocks or tools.  Regularly check to see if handrails are loose or broken. Make sure that both sides of any steps have handrails.  Any raised decks and porches should have guardrails on the edges.  Have any leaves, snow, or ice cleared regularly.  Use sand or salt on walking paths during winter.  Clean up any spills in your garage right away. This includes oil or grease spills. What can I do in the bathroom?  Use night lights.  Install grab bars by the toilet and in the tub and shower. Do not use towel bars as grab bars.  Use non-skid mats or decals in the tub or shower.  If you need to sit down in the shower, use a plastic, non-slip stool.  Keep the floor dry. Clean up any water that spills on the floor as soon as it happens.  Remove soap buildup in the tub or shower regularly.  Attach bath mats securely with  double-sided non-slip rug tape.  Do not have throw rugs and other things on the floor that can make you trip. What can I do in the bedroom?  Use night lights.  Make sure that you have a light by your bed that is easy to reach.  Do not use any sheets or blankets that are too big for your bed. They should not hang down onto the floor.  Have a firm chair that has side arms. You can use this for support while you get dressed.  Do not have throw rugs and other things on the floor that can make you trip. What can I do in the kitchen?  Clean up any spills right away.  Avoid walking on wet floors.  Keep items that you use a lot in easy-to-reach places.  If you need to reach something above you, use a strong step stool that has a grab bar.  Keep electrical cords out of the way.  Do not use floor polish or wax that makes floors slippery. If you must use wax, use non-skid floor wax.  Do not have throw rugs and other things  on the floor that can make you trip. What can I do with my stairs?  Do not leave any items on the stairs.  Make sure that there are handrails on both sides of the stairs and use them. Fix handrails that are broken or loose. Make sure that handrails are as long as the stairways.  Check any carpeting to make sure that it is firmly attached to the stairs. Fix any carpet that is loose or worn.  Avoid having throw rugs at the top or bottom of the stairs. If you do have throw rugs, attach them to the floor with carpet tape.  Make sure that you have a light switch at the top of the stairs and the bottom of the stairs. If you do not have them, ask someone to add them for you. What else can I do to help prevent falls?  Wear shoes that:  Do not have high heels.  Have rubber bottoms.  Are comfortable and fit you well.  Are closed at the toe. Do not wear sandals.  If you use a stepladder:  Make sure that it is fully opened. Do not climb a closed stepladder.  Make  sure that both sides of the stepladder are locked into place.  Ask someone to hold it for you, if possible.  Clearly mark and make sure that you can see:  Any grab bars or handrails.  First and last steps.  Where the edge of each step is.  Use tools that help you move around (mobility aids) if they are needed. These include:  Canes.  Walkers.  Scooters.  Crutches.  Turn on the lights when you go into a dark area. Replace any light bulbs as soon as they burn out.  Set up your furniture so you have a clear path. Avoid moving your furniture around.  If any of your floors are uneven, fix them.  If there are any pets around you, be aware of where they are.  Review your medicines with your doctor. Some medicines can make you feel dizzy. This can increase your chance of falling. Ask your doctor what other things that you can do to help prevent falls. This information is not intended to replace advice given to you by your health care provider. Make sure you discuss any questions you have with your health care provider. Document Released: 07/13/2009 Document Revised: 02/22/2016 Document Reviewed: 10/21/2014 Elsevier Interactive Patient Education  2017 [employer].

## 2018-01-12 NOTE — Progress Notes (Signed)
Subjective:   Kimberly [name] is a 72 y.o. female who presents for Medicare Annual (Subsequent) preventive examination.  Review of Systems:  N/A  Cardiac Risk Factors include: advanced age (>26men, >70 women);dyslipidemia     Objective:     Vitals: BP (!) 116/58 (BP Location: Left Arm)   Pulse 88   Temp 98.4 F (36.9 C) (Oral)   Ht 5\' 5"  (1.651 m)   Wt 156 lb (70.8 kg)   BMI 25.96 kg/m   Body mass index is 25.96 kg/m.  Advanced Directives 01/12/2018 01/07/2018 12/18/2017 10/30/2017 10/01/2017 09/16/2017 09/02/2017  Does Patient Have a Medical Advance Directive? Yes Yes No Yes Yes Yes Yes  Type of Industrial/product designer of [place];Living will [place];Living will [place];Living will Healthcare Power of [place] -  Does patient want to make changes to medical advance directive? - - - - - - -  Copy of [place] in Chart? Yes - No - copy requested - Yes Yes -  Would patient like information on creating a medical advance directive? - - - - - - -  Some encounter information is confidential and restricted. Go to Review Flowsheets activity to see all data.    Tobacco Social History   Tobacco Use  Smoking Status Former Smoker  . Packs/day: 0.25  . Years: 55.00  . Pack years: 13.75  . Types: Cigarettes  . Last attempt to quit: 05/21/2017  . Years since quitting: 0.6  Smokeless Tobacco Never Used  Tobacco Comment   started age 56 1/2 to 1 ppd     Counseling given: Not Answered Comment: started age 21 1/2 to 1 ppd   Clinical Intake:  Pre-visit preparation completed: Yes  Pain : No/denies pain Pain Score: 0-No pain     Nutritional Status: BMI 25 -29 Overweight Nutritional Risks: Nausea/ vomitting/ diarrhea(nausea and diarrhea due to current radiation treatment.) Diabetes: No  How often do you need to have someone help you when you read  instructions, pamphlets, or other written materials from your doctor or pharmacy?: 1 - Never  Interpreter Needed?: No  Information entered by :: [facility], LPN  Past Medical History:  Diagnosis Date  . Breast cancer ([place])   . Breast cancer, left ([facility])    Lumpectomy and rad tx's.  . Chronic back pain   . Chronic knee pain   . Colon cancer ([place])   . Degenerative disc disease, lumbar 04/2013  . Dyspnea   . Family history of adverse reaction to anesthesia    son arrested after anesthesia  . GERD (gastroesophageal reflux disease)   . Hyperlipidemia   . Iron deficiency anemia 05/27/2017  . Neuropathy   . Osteoarthritis    of knee  . Osteoporosis   . Tobacco abuse   . Vitamin D deficiency    Past Surgical History:  Procedure Laterality Date  . APPENDECTOMY  1965  . BREAST EXCISIONAL BIOPSY Left 08/01/2003   lumpectomy rad 11/04-2/28/2005  . CESAREAN SECTION     x3  . COLON SURGERY    . COLONOSCOPY W/ POLYPECTOMY    . ESOPHAGOGASTRODUODENOSCOPY (EGD) WITH PROPOFOL N/A 04/04/2017   Procedure: ESOPHAGOGASTRODUODENOSCOPY (EGD) WITH PROPOFOL;  Surgeon: [name], MD;  Location: [facility];  Service: Endoscopy;  Laterality: N/A;  . HIP FRACTURE SURGERY Left 01/25/2012  . LAPAROTOMY N/A 04/15/2017   Procedure: EXPLORATORY LAPAROTOMY;  Surgeon: Clayburn Pert, MD;  Location:  [facility];  Service: General;  Laterality: N/A;  . NECK SURGERY  12/2011  . PARASTOMAL HERNIA REPAIR N/A 12/03/2017   Procedure: HERNIA REPAIR PARASTOMAL;  Surgeon: Jules Husbands, MD;  Location: [facility];  Service: General;  Laterality: N/A;  . PORTACATH PLACEMENT Right 05/21/2017   Procedure: INSERTION PORT-A-CATH;  Surgeon: Clayburn Pert, MD;  Location: [facility];  Service: General;  Laterality: Right;  . WRIST FRACTURE SURGERY     Family History  Problem Relation Age of Onset  . Diabetes Mother        type 2  . Coronary artery disease Mother   . Deep vein thrombosis Mother   . Colon cancer Father    . Asthma Brother   . Diabetes Brother        type 2   Social History   Socioeconomic History  . Marital status: Divorced    Spouse name: Not on file  . Number of children: 3  . Years of education: Not on file  . Highest education level: 7th grade  Occupational History  . Occupation: retired  Scientific laboratory technician  . Financial resource strain: Not hard at all  . Food insecurity:    Worry: Never true    Inability: Never true  . Transportation needs:    Medical: No    Non-medical: No  Tobacco Use  . Smoking status: Former Smoker    Packs/day: 0.25    Years: 55.00    Pack years: 13.75    Types: Cigarettes    Last attempt to quit: 05/21/2017    Years since quitting: 0.6  . Smokeless tobacco: Never Used  . Tobacco comment: started age 17 1/2 to 1 ppd  Substance and Sexual Activity  . Alcohol use: Yes    Alcohol/week: 0.0 oz    Comment: occasionally drinks beer  . Drug use: No  . Sexual activity: Never  Lifestyle  . Physical activity:    Days per week: Not on file    Minutes per session: Not on file  . Stress: Not at all  Relationships  . Social connections:    Talks on phone: Not on file    Gets together: Not on file    Attends religious service: Not on file    Active member of club or organization: Not on file    Attends meetings of clubs or organizations: Not on file    Relationship status: Not on file  Other Topics Concern  . Not on file  Social History Narrative  . Not on file    Outpatient Encounter Medications as of 01/12/2018  Medication Sig  . budesonide-formoterol (SYMBICORT) 80-4.5 MCG/ACT inhaler Inhale 2 puffs into the lungs 2 (two) times daily.  . Calcium Citrate-Vitamin D (CITRACAL/VITAMIN D PO) Take 1 tablet by mouth daily.   . chlorhexidine (PERIDEX) 0.12 % solution USE AS DIRECTED 15 MLS IN THE MOUTH OR THROAT 2 (TWO) TIMES DAILY. SWISH AND SPIT  . Cyanocobalamin (VITAMIN B 12 PO) Take 1 tablet by mouth daily.   . feeding supplement (BOOST / RESOURCE  BREEZE) LIQD Take 1 Container by mouth 3 (three) times daily between meals.  . gabapentin (NEURONTIN) 400 MG capsule TAKE 1 CAPSULE TWICE DAILY  . HYDROcodone-acetaminophen (NORCO) 7.5-325 MG tablet Take 1-2 tablets by mouth every 6 (six) hours as needed for moderate pain.  Marland Kitchen HYDROcodone-acetaminophen (NORCO/VICODIN) 5-325 MG tablet Take 1 tablet by mouth every 6 (six) hours as needed for moderate pain.  Marland Kitchen ibuprofen (ADVIL,MOTRIN) 200 MG tablet  Take 400 mg by mouth daily as needed for headache or moderate pain.  Marland Kitchen lidocaine-prilocaine (EMLA) cream Apply to affected area once  . LORazepam (ATIVAN) 0.5 MG tablet Take 1 tablet (0.5 mg total) by mouth every 6 (six) hours as needed (Nausea or vomiting).  . magnesium chloride (SLOW-MAG) 64 MG TBEC SR tablet Take 1 tablet by mouth daily.  . meloxicam (MOBIC) 15 MG tablet TAKE 1 TABLET EVERY DAY AS NEEDED  . omeprazole (PRILOSEC) 20 MG capsule TAKE 1 CAPSULE EVERY DAY  . ondansetron (ZOFRAN) 8 MG tablet Take 1 tablet (8 mg total) by mouth 2 (two) times daily as needed for refractory nausea / vomiting. Start on day 3 after chemotherapy.  . pravastatin (PRAVACHOL) 40 MG tablet TAKE 1 TABLET (40 MG TOTAL) BY MOUTH DAILY.  . Probiotic Product (PROBIOTIC DAILY PO) Take 1 capsule by mouth daily.   . prochlorperazine (COMPAZINE) 10 MG tablet Take 1 tablet (10 mg total) by mouth every 6 (six) hours as needed (Nausea or vomiting).  . pyridOXINE (VITAMIN B-6) 50 MG tablet Take 1 tablet (50 mg total) by mouth daily.  . simethicone (MYLICON) 80 MG chewable tablet Chew 2 tablets (160 mg total) by mouth every 6 (six) hours as needed for flatulence.  . Triamcinolone Acetonide (NASACORT ALLERGY 24HR NA) Place 1 spray into the nose daily as needed (allergies).   Facility-Administered Encounter Medications as of 01/12/2018  Medication  . 0.9 %  sodium chloride infusion  . heparin lock flush 100 unit/mL  . heparin lock flush 100 unit/mL  . heparin lock flush 100 unit/mL    . sodium chloride flush (NS) 0.9 % injection 10 mL  . sodium chloride flush (NS) 0.9 % injection 10 mL    Activities of Daily Living In your present state of health, do you have any difficulty performing the following activities: 01/12/2018  Hearing? N  Vision? N  Difficulty concentrating or making decisions? Y  Walking or climbing stairs? Y  Comment Due to hip pain.  Dressing or bathing? N  Doing errands, shopping? N  Preparing Food and eating ? N  Using the Toilet? N  In the past six months, have you accidently leaked urine? N  Do you have problems with loss of bowel control? N  Managing your Medications? N  Managing your Finances? N  Housekeeping or managing your Housekeeping? N  Some encounter information is confidential and restricted. Go to Review Flowsheets activity to see all data.  Some recent data might be hidden    Patient Care Team: Birdie Sons, MD as PCP - General (Family Medicine) Earlie Server, MD as Consulting Physician (Oncology) Noreene Filbert, MD as Referring Physician (Radiation Oncology) Jules Husbands, MD as Consulting Physician (General Surgery)    Assessment:   This is a routine wellness examination for Daje.  Exercise Activities and Dietary recommendations Current Exercise Habits: The patient does not participate in regular exercise at present, Exercise limited by: Other - see comments(currently receiving radiation and has no energy)  Goals    None      Fall Risk Fall Risk  01/12/2018 10/01/2017 08/07/2017 06/03/2017 01/10/2017  Falls in the past year? No No No No No   Is the patient's home free of loose throw rugs in walkways, pet beds, electrical cords, etc?   yes      Grab bars in the bathroom? yes      Handrails on the stairs?   no      Adequate lighting?  yes  Timed Get Up and Go performed: N/A  Depression Screen PHQ 2/9 Scores 01/12/2018 01/10/2017 01/10/2017 07/12/2016  PHQ - 2 Score 0 0 0 0  PHQ- 9 Score - 0 - -     Cognitive  Function: Pt declined screening today.      6CIT Screen 01/10/2017  What Year? 0 points  What month? 0 points  What time? 0 points  Count back from 20 0 points  Months in reverse 2 points  Repeat phrase 0 points  Total Score 2    Immunization History  Administered Date(s) Administered  . Influenza, High Dose Seasonal PF 07/12/2016, 12/05/2017  . Influenza-Unspecified 08/30/2013  . Pneumococcal Conjugate-13 06/30/2014  . Pneumococcal Polysaccharide-23 03/02/2013, 04/17/2017  . Tdap 12/02/2014  . Zoster 12/02/2014    Qualifies for Shingles Vaccine? Due for Shingles vaccine. Declined my offer to administer today. Education has been provided regarding the importance of this vaccine. Pt has been advised to call her insurance company to determine her out of pocket expense. Advised she may also receive this vaccine at her local pharmacy or Health Dept. Verbalized acceptance and understanding.  Screening Tests Health Maintenance  Topic Date Due  . MAMMOGRAM  12/28/2017  . INFLUENZA VACCINE  04/30/2018  . DEXA SCAN  05/08/2019  . COLONOSCOPY  01/05/2020  . TETANUS/TDAP  12/01/2024  . Hepatitis C Screening  Completed  . PNA vac Low Risk Adult  Completed    Cancer Screenings: Lung: Low Dose CT Chest recommended if Age 5-80 years, 30 pack-year currently smoking OR have quit w/in 15years. Patient does not qualify, completed 01/21/17. Breast:  Up to date on Mammogram? No, pt to set up apt this year.    Up to date of Bone Density/Dexa? Yes Colorectal: Up to date  Additional Screenings:  Hepatitis C Screening: Up to date     Plan:  I have personally reviewed and addressed the Medicare Annual Wellness questionnaire and have noted the following in the patient's chart:  A. Medical and social history B. Use of alcohol, tobacco or illicit drugs  C. Current medications and supplements D. Functional ability and status E.  Nutritional status F.  Physical activity G. Advance  directives H. List of other physicians I.  Hospitalizations, surgeries, and ER visits in previous 12 months J.  [place] such as hearing and vision if needed, cognitive and depression L. Referrals and appointments - none  In addition, I have reviewed and discussed with patient certain preventive protocols, quality metrics, and best practice recommendations. A written personalized care plan for preventive services as well as general preventive health recommendations were provided to patient.  See attached scanned questionnaire for additional information.   Signed,  Fabio Neighbors, LPN Nurse Health Advisor   Nurse Recommendations: Pt declined setting up a mammogram currently. Pt would like to wait until radiation is complete (later this year) before setting up apt.

## 2018-01-13 ENCOUNTER — Ambulatory Visit
Admission: RE | Admit: 2018-01-13 | Discharge: 2018-01-13 | Disposition: A | Discharge status: Home / Self Care | Payer: Medicare HMO | Source: Ambulatory Visit | Attending: Radiation Oncology | Admitting: Radiation Oncology

## 2018-01-13 DIAGNOSIS — Z510 Encounter for antineoplastic radiation therapy: Secondary | ICD-10-CM | POA: Diagnosis not present

## 2018-01-13 DIAGNOSIS — C184 Malignant neoplasm of transverse colon: Secondary | ICD-10-CM | POA: Diagnosis not present

## 2018-01-13 DIAGNOSIS — Z87891 Personal history of nicotine dependence: Secondary | ICD-10-CM | POA: Diagnosis not present

## 2018-01-13 DIAGNOSIS — C779 Secondary and unspecified malignant neoplasm of lymph node, unspecified: Secondary | ICD-10-CM | POA: Diagnosis not present

## 2018-01-14 ENCOUNTER — Inpatient Hospital Stay: Payer: Medicare HMO

## 2018-01-14 ENCOUNTER — Ambulatory Visit
Admission: RE | Admit: 2018-01-14 | Discharge: 2018-01-14 | Disposition: A | Discharge status: Home / Self Care | Payer: Medicare HMO | Source: Ambulatory Visit | Attending: Radiation Oncology | Admitting: Radiation Oncology

## 2018-01-14 DIAGNOSIS — Z923 Personal history of irradiation: Secondary | ICD-10-CM | POA: Diagnosis not present

## 2018-01-14 DIAGNOSIS — C184 Malignant neoplasm of transverse colon: Secondary | ICD-10-CM | POA: Diagnosis not present

## 2018-01-14 DIAGNOSIS — Z452 Encounter for adjustment and management of vascular access device: Secondary | ICD-10-CM | POA: Diagnosis not present

## 2018-01-14 DIAGNOSIS — Z87891 Personal history of nicotine dependence: Secondary | ICD-10-CM | POA: Diagnosis not present

## 2018-01-14 DIAGNOSIS — Z800 Family history of malignant neoplasm of digestive organs: Secondary | ICD-10-CM | POA: Diagnosis not present

## 2018-01-14 DIAGNOSIS — L089 Local infection of the skin and subcutaneous tissue, unspecified: Secondary | ICD-10-CM | POA: Diagnosis not present

## 2018-01-14 DIAGNOSIS — Z853 Personal history of malignant neoplasm of breast: Secondary | ICD-10-CM | POA: Diagnosis not present

## 2018-01-14 DIAGNOSIS — C779 Secondary and unspecified malignant neoplasm of lymph node, unspecified: Secondary | ICD-10-CM | POA: Diagnosis not present

## 2018-01-14 DIAGNOSIS — Z510 Encounter for antineoplastic radiation therapy: Secondary | ICD-10-CM | POA: Diagnosis not present

## 2018-01-14 LAB — CBC
HCT: 40.7 % (ref 35.0–47.0)
Hemoglobin: 14 g/dL (ref 12.0–16.0)
MCH: 33 pg (ref 26.0–34.0)
MCHC: 34.4 g/dL (ref 32.0–36.0)
MCV: 96 fL (ref 80.0–100.0)
Platelets: 375 10*3/uL (ref 150–440)
RBC: 4.24 MIL/uL (ref 3.80–5.20)
RDW: 14.1 % (ref 11.5–14.5)
WBC: 9.4 10*3/uL (ref 3.6–11.0)

## 2018-01-15 ENCOUNTER — Inpatient Hospital Stay: Payer: Medicare HMO

## 2018-01-15 ENCOUNTER — Ambulatory Visit
Admission: RE | Admit: 2018-01-15 | Discharge: 2018-01-15 | Disposition: A | Discharge status: Home / Self Care | Payer: Medicare HMO | Source: Ambulatory Visit | Attending: Radiation Oncology | Admitting: Radiation Oncology

## 2018-01-15 DIAGNOSIS — C779 Secondary and unspecified malignant neoplasm of lymph node, unspecified: Secondary | ICD-10-CM | POA: Diagnosis not present

## 2018-01-15 DIAGNOSIS — Z510 Encounter for antineoplastic radiation therapy: Secondary | ICD-10-CM | POA: Diagnosis not present

## 2018-01-15 DIAGNOSIS — C184 Malignant neoplasm of transverse colon: Secondary | ICD-10-CM | POA: Diagnosis not present

## 2018-01-15 DIAGNOSIS — Z87891 Personal history of nicotine dependence: Secondary | ICD-10-CM | POA: Diagnosis not present

## 2018-01-16 ENCOUNTER — Ambulatory Visit
Admission: RE | Admit: 2018-01-16 | Discharge: 2018-01-16 | Disposition: A | Discharge status: Home / Self Care | Payer: Medicare HMO | Source: Ambulatory Visit | Attending: Radiation Oncology | Admitting: Radiation Oncology

## 2018-01-16 DIAGNOSIS — Z510 Encounter for antineoplastic radiation therapy: Secondary | ICD-10-CM | POA: Diagnosis not present

## 2018-01-16 DIAGNOSIS — C779 Secondary and unspecified malignant neoplasm of lymph node, unspecified: Secondary | ICD-10-CM | POA: Diagnosis not present

## 2018-01-16 DIAGNOSIS — Z87891 Personal history of nicotine dependence: Secondary | ICD-10-CM | POA: Diagnosis not present

## 2018-01-16 DIAGNOSIS — C184 Malignant neoplasm of transverse colon: Secondary | ICD-10-CM | POA: Diagnosis not present

## 2018-01-19 ENCOUNTER — Ambulatory Visit
Admission: RE | Admit: 2018-01-19 | Discharge: 2018-01-19 | Disposition: A | Discharge status: Home / Self Care | Payer: Medicare HMO | Source: Ambulatory Visit | Attending: Radiation Oncology | Admitting: Radiation Oncology

## 2018-01-19 ENCOUNTER — Other Ambulatory Visit: Payer: Self-pay | Admitting: Family Medicine

## 2018-01-19 DIAGNOSIS — Z510 Encounter for antineoplastic radiation therapy: Secondary | ICD-10-CM | POA: Diagnosis not present

## 2018-01-19 DIAGNOSIS — M5136 Other intervertebral disc degeneration, lumbar region: Secondary | ICD-10-CM

## 2018-01-19 DIAGNOSIS — Z87891 Personal history of nicotine dependence: Secondary | ICD-10-CM | POA: Diagnosis not present

## 2018-01-19 DIAGNOSIS — C779 Secondary and unspecified malignant neoplasm of lymph node, unspecified: Secondary | ICD-10-CM | POA: Diagnosis not present

## 2018-01-19 DIAGNOSIS — C184 Malignant neoplasm of transverse colon: Secondary | ICD-10-CM | POA: Diagnosis not present

## 2018-01-19 MED ORDER — HYDROCODONE-ACETAMINOPHEN 7.5-325 MG PO TABS: 1.0000 | ORAL_TABLET | Freq: Four times a day (QID) | ORAL | 0 refills | Status: DC | PRN

## 2018-01-20 ENCOUNTER — Ambulatory Visit
Admission: RE | Admit: 2018-01-20 | Discharge: 2018-01-20 | Disposition: A | Discharge status: Home / Self Care | Payer: Medicare HMO | Source: Ambulatory Visit | Attending: Radiation Oncology | Admitting: Radiation Oncology

## 2018-01-20 DIAGNOSIS — C779 Secondary and unspecified malignant neoplasm of lymph node, unspecified: Secondary | ICD-10-CM | POA: Diagnosis not present

## 2018-01-20 DIAGNOSIS — E559 Vitamin D deficiency, unspecified: Secondary | ICD-10-CM | POA: Diagnosis not present

## 2018-01-20 DIAGNOSIS — E785 Hyperlipidemia, unspecified: Secondary | ICD-10-CM | POA: Diagnosis not present

## 2018-01-20 DIAGNOSIS — C184 Malignant neoplasm of transverse colon: Secondary | ICD-10-CM | POA: Diagnosis not present

## 2018-01-20 DIAGNOSIS — Z510 Encounter for antineoplastic radiation therapy: Secondary | ICD-10-CM | POA: Diagnosis not present

## 2018-01-20 DIAGNOSIS — Z87891 Personal history of nicotine dependence: Secondary | ICD-10-CM | POA: Diagnosis not present

## 2018-01-21 ENCOUNTER — Ambulatory Visit
Admission: RE | Admit: 2018-01-21 | Discharge: 2018-01-21 | Disposition: A | Discharge status: Home / Self Care | Payer: Medicare HMO | Source: Ambulatory Visit | Attending: Radiation Oncology | Admitting: Radiation Oncology

## 2018-01-21 ENCOUNTER — Inpatient Hospital Stay: Payer: Medicare HMO

## 2018-01-21 DIAGNOSIS — L089 Local infection of the skin and subcutaneous tissue, unspecified: Secondary | ICD-10-CM | POA: Diagnosis not present

## 2018-01-21 DIAGNOSIS — Z452 Encounter for adjustment and management of vascular access device: Secondary | ICD-10-CM | POA: Diagnosis not present

## 2018-01-21 DIAGNOSIS — Z87891 Personal history of nicotine dependence: Secondary | ICD-10-CM | POA: Diagnosis not present

## 2018-01-21 DIAGNOSIS — Z853 Personal history of malignant neoplasm of breast: Secondary | ICD-10-CM | POA: Diagnosis not present

## 2018-01-21 DIAGNOSIS — Z800 Family history of malignant neoplasm of digestive organs: Secondary | ICD-10-CM | POA: Diagnosis not present

## 2018-01-21 DIAGNOSIS — C184 Malignant neoplasm of transverse colon: Secondary | ICD-10-CM

## 2018-01-21 DIAGNOSIS — C779 Secondary and unspecified malignant neoplasm of lymph node, unspecified: Secondary | ICD-10-CM | POA: Diagnosis not present

## 2018-01-21 DIAGNOSIS — Z510 Encounter for antineoplastic radiation therapy: Secondary | ICD-10-CM | POA: Diagnosis not present

## 2018-01-21 DIAGNOSIS — Z923 Personal history of irradiation: Secondary | ICD-10-CM | POA: Diagnosis not present

## 2018-01-21 LAB — LIPID PANEL
CHOL/HDL RATIO: 4.4 ratio (ref 0.0–4.4)
Cholesterol, Total: 217 mg/dL — ABNORMAL HIGH (ref 100–199)
HDL: 49 mg/dL (ref >39)
LDL CALC: 108 mg/dL — AB (ref 0–99)
TRIGLYCERIDES: 302 mg/dL — AB (ref 0–149)
VLDL Cholesterol Cal: 60 mg/dL — ABNORMAL HIGH (ref 5–40)

## 2018-01-21 LAB — CBC
HCT: 42 % (ref 35.0–47.0)
Hemoglobin: 14.5 g/dL (ref 12.0–16.0)
MCH: 33.1 pg (ref 26.0–34.0)
MCHC: 34.5 g/dL (ref 32.0–36.0)
MCV: 95.9 fL (ref 80.0–100.0)
PLATELETS: 335 10*3/uL (ref 150–440)
RBC: 4.38 MIL/uL (ref 3.80–5.20)
RDW: 13.8 % (ref 11.5–14.5)
WBC: 8.8 10*3/uL (ref 3.6–11.0)

## 2018-01-21 LAB — VITAMIN D 25 HYDROXY (VIT D DEFICIENCY, FRACTURES): VIT D 25 HYDROXY: 17.7 ng/mL — AB (ref 30.0–100.0)

## 2018-01-22 ENCOUNTER — Ambulatory Visit
Admission: RE | Admit: 2018-01-22 | Discharge: 2018-01-22 | Disposition: A | Discharge status: Home / Self Care | Payer: Medicare HMO | Source: Ambulatory Visit | Attending: Radiation Oncology | Admitting: Radiation Oncology

## 2018-01-22 DIAGNOSIS — Z87891 Personal history of nicotine dependence: Secondary | ICD-10-CM | POA: Diagnosis not present

## 2018-01-22 DIAGNOSIS — C184 Malignant neoplasm of transverse colon: Secondary | ICD-10-CM | POA: Diagnosis not present

## 2018-01-22 DIAGNOSIS — C779 Secondary and unspecified malignant neoplasm of lymph node, unspecified: Secondary | ICD-10-CM | POA: Diagnosis not present

## 2018-01-22 DIAGNOSIS — Z510 Encounter for antineoplastic radiation therapy: Secondary | ICD-10-CM | POA: Diagnosis not present

## 2018-01-23 ENCOUNTER — Ambulatory Visit
Admission: RE | Admit: 2018-01-23 | Discharge: 2018-01-23 | Disposition: A | Discharge status: Home / Self Care | Payer: Medicare HMO | Source: Ambulatory Visit | Attending: Radiation Oncology | Admitting: Radiation Oncology

## 2018-01-23 DIAGNOSIS — C779 Secondary and unspecified malignant neoplasm of lymph node, unspecified: Secondary | ICD-10-CM | POA: Diagnosis not present

## 2018-01-23 DIAGNOSIS — C184 Malignant neoplasm of transverse colon: Secondary | ICD-10-CM | POA: Diagnosis not present

## 2018-01-23 DIAGNOSIS — Z87891 Personal history of nicotine dependence: Secondary | ICD-10-CM | POA: Diagnosis not present

## 2018-01-23 DIAGNOSIS — Z510 Encounter for antineoplastic radiation therapy: Secondary | ICD-10-CM | POA: Diagnosis not present

## 2018-01-26 ENCOUNTER — Ambulatory Visit
Admission: RE | Admit: 2018-01-26 | Discharge: 2018-01-26 | Disposition: A | Discharge status: Home / Self Care | Payer: Medicare HMO | Source: Ambulatory Visit | Attending: Radiation Oncology | Admitting: Radiation Oncology

## 2018-01-26 DIAGNOSIS — Z510 Encounter for antineoplastic radiation therapy: Secondary | ICD-10-CM | POA: Diagnosis not present

## 2018-01-26 DIAGNOSIS — C184 Malignant neoplasm of transverse colon: Secondary | ICD-10-CM | POA: Diagnosis not present

## 2018-01-26 DIAGNOSIS — C779 Secondary and unspecified malignant neoplasm of lymph node, unspecified: Secondary | ICD-10-CM | POA: Diagnosis not present

## 2018-01-26 DIAGNOSIS — Z87891 Personal history of nicotine dependence: Secondary | ICD-10-CM | POA: Diagnosis not present

## 2018-01-27 ENCOUNTER — Inpatient Hospital Stay (HOSPITAL_BASED_OUTPATIENT_CLINIC_OR_DEPARTMENT_OTHER): Payer: Medicare HMO | Admitting: Nurse Practitioner

## 2018-01-27 ENCOUNTER — Ambulatory Visit
Admission: RE | Admit: 2018-01-27 | Discharge: 2018-01-27 | Disposition: A | Discharge status: Home / Self Care | Payer: Medicare HMO | Source: Ambulatory Visit | Attending: Radiation Oncology | Admitting: Radiation Oncology

## 2018-01-27 DIAGNOSIS — C779 Secondary and unspecified malignant neoplasm of lymph node, unspecified: Secondary | ICD-10-CM | POA: Diagnosis not present

## 2018-01-27 DIAGNOSIS — C184 Malignant neoplasm of transverse colon: Secondary | ICD-10-CM | POA: Diagnosis not present

## 2018-01-27 DIAGNOSIS — Z87891 Personal history of nicotine dependence: Secondary | ICD-10-CM | POA: Diagnosis not present

## 2018-01-27 DIAGNOSIS — Z853 Personal history of malignant neoplasm of breast: Secondary | ICD-10-CM | POA: Diagnosis not present

## 2018-01-27 DIAGNOSIS — Z800 Family history of malignant neoplasm of digestive organs: Secondary | ICD-10-CM | POA: Diagnosis not present

## 2018-01-27 DIAGNOSIS — Z923 Personal history of irradiation: Secondary | ICD-10-CM | POA: Diagnosis not present

## 2018-01-27 DIAGNOSIS — Z510 Encounter for antineoplastic radiation therapy: Secondary | ICD-10-CM | POA: Diagnosis not present

## 2018-01-27 DIAGNOSIS — L089 Local infection of the skin and subcutaneous tissue, unspecified: Secondary | ICD-10-CM | POA: Diagnosis not present

## 2018-01-27 DIAGNOSIS — Z452 Encounter for adjustment and management of vascular access device: Secondary | ICD-10-CM | POA: Diagnosis not present

## 2018-01-27 MED ORDER — DOXYCYCLINE HYCLATE 100 MG PO TABS: 100.0000 mg | ORAL_TABLET | Freq: Two times a day (BID) | ORAL | 0 refills | Status: AC

## 2018-01-27 NOTE — Progress Notes (Signed)
[facility] [place]  [phone] [phone]  Patient Care Team: Birdie Sons, MD as PCP - General (Family Medicine) Earlie Server, MD as Consulting Physician (Oncology) Noreene Filbert, MD as Referring Physician (Radiation Oncology) Jules Husbands, MD as Consulting Physician (General Surgery)   Name of the patient: Kimberly [name]  [id number]  [birth year]   Date of visit: 01/27/18  Diagnosis-stage IIIb colon cancer  Chief complaint/ Reason for visit-skin infection at port site  Heme/Onc history: Patient last evaluated by primary oncologist, Dr. Tasia Catchings, on 01/07/2018.  She has a history of stage IIIb colon cancer and remote history of breast cancer.  Initially she presented to complaining of abdominal pain, weight loss, and bloating.  CT scan showed partial obstruction at the level of transverse colon and ileocolic mesenteric adenopathy.  She is prepared for colonoscopy on 04/15/2017.  Unfortunately she started having worsening abdominal pain nausea, vomiting, and unable to maintain oral intake and was sent to ER.  She was found to have bowel obstruction and underwent exploratory laparotomy with transverse colectomy with end colostomy and mucous fistula formation for obstructing colon lesion.  She has had a colonoscopy 2 years prior without mass being present at that time but did have 2 small polyps in the transverse colon which were removed.  Pathology showed [id number] moderately differentiated adenocarcinoma with negative margins.  3 of 16 lymph nodes involved.  Low probability of MSI-H.  Genetic testing with [employer] -negative.  No pathogen genetic sequence variance or deletion/duplications identified. Baseline CEA was 2.1 Initiated adjuvant FOLFOX on 05/27/2017.  Oxaliplatin was discontinued after 3 cycles due to worsening of pre-existing neuropathy.  Patient completed 9 cycles of 5-FU.  Interval history- Kimberly [name] presents for evaluation of  a skin infection located at her port site. She noticed symptoms starting yesterday. Since that time she notices less swelling but continues to have drainage.  Abscess has associated symptoms of spontaneous drainage and crusting. She reports yesterday it was swollen and painful but that has improved today.  She states this has not happened in the past. She was referred to Symptom Management from radiation today.   ECOG FS:0 - Asymptomatic  Review of systems- Review of Systems  Constitutional: Negative.   HENT: Negative.   Eyes: Negative.   Respiratory: Negative.   Cardiovascular: Negative.   Gastrointestinal: Negative.   Genitourinary: Negative.   Musculoskeletal: Negative.   Skin:       Itching and wound at port site  Neurological: Negative.   Endo/Heme/Allergies: Negative.   Psychiatric/Behavioral: Negative.      Current treatment- last 5-FU on 11/04/17  Allergies  Allergen Reactions  . Betadine [Povidone Iodine] Other (See Comments)    Topical iodine she states "if you put it in an open sore it will cause a eating ulcer."  . Other Nausea And Vomiting    Antihystamines  . Sinus Formula [Cholestatin] Nausea Only     Past Medical History:  Diagnosis Date  . Breast cancer, left (HCC)    Lumpectomy and rad tx's.  . Chronic back pain   . Colon cancer ([place])   . Degenerative disc disease, lumbar 04/2013  . Dyspnea   . Family history of adverse reaction to anesthesia    son arrested after anesthesia  . GERD (gastroesophageal reflux disease)   . Hyperlipidemia   . Iron deficiency anemia 05/27/2017  . Neuropathy   . Osteoarthritis    of knee  . Osteoporosis   . Tobacco  abuse   . Vitamin D deficiency      Past Surgical History:  Procedure Laterality Date  . APPENDECTOMY  1965  . BREAST EXCISIONAL BIOPSY Left 08/01/2003   lumpectomy rad 11/04-2/28/2005  . CESAREAN SECTION     x3  . COLON SURGERY    . COLONOSCOPY W/ POLYPECTOMY    . ESOPHAGOGASTRODUODENOSCOPY (EGD) WITH  PROPOFOL N/A 04/04/2017   Procedure: ESOPHAGOGASTRODUODENOSCOPY (EGD) WITH PROPOFOL;  Surgeon: [name], MD;  Location: [facility];  Service: Endoscopy;  Laterality: N/A;  . HIP FRACTURE SURGERY Left 01/25/2012  . LAPAROTOMY N/A 04/15/2017   Procedure: EXPLORATORY LAPAROTOMY;  Surgeon: Clayburn Pert, MD;  Location: [facility];  Service: General;  Laterality: N/A;  . NECK SURGERY  12/2011  . PARASTOMAL HERNIA REPAIR N/A 12/03/2017   Procedure: HERNIA REPAIR PARASTOMAL;  Surgeon: Jules Husbands, MD;  Location: [facility];  Service: General;  Laterality: N/A;  . PORTACATH PLACEMENT Right 05/21/2017   Procedure: INSERTION PORT-A-CATH;  Surgeon: Clayburn Pert, MD;  Location: [facility];  Service: General;  Laterality: Right;  . WRIST FRACTURE SURGERY      Social History   Socioeconomic History  . Marital status: Divorced    Spouse name: Not on file  . Number of children: 3  . Years of education: Not on file  . Highest education level: 7th grade  Occupational History  . Occupation: retired  Scientific laboratory technician  . Financial resource strain: Not hard at all  . Food insecurity:    Worry: Never true    Inability: Never true  . Transportation needs:    Medical: No    Non-medical: No  Tobacco Use  . Smoking status: Former Smoker    Packs/day: 0.25    Years: 55.00    Pack years: 13.75    Types: Cigarettes    Last attempt to quit: 05/21/2017    Years since quitting: 0.6  . Smokeless tobacco: Never Used  . Tobacco comment: started age 79 1/2 to 1 ppd  Substance and Sexual Activity  . Alcohol use: Yes    Alcohol/week: 0.0 oz    Comment: occasionally drinks beer  . Drug use: No  . Sexual activity: Never  Lifestyle  . Physical activity:    Days per week: Not on file    Minutes per session: Not on file  . Stress: Not at all  Relationships  . Social connections:    Talks on phone: Not on file    Gets together: Not on file    Attends religious service: Not on file    Active member  of club or organization: Not on file    Attends meetings of clubs or organizations: Not on file    Relationship status: Not on file  . Intimate partner violence:    Fear of current or ex partner: Not on file    Emotionally abused: Not on file    Physically abused: Not on file    Forced sexual activity: Not on file  Other Topics Concern  . Not on file  Social History Narrative  . Not on file    Family History  Problem Relation Age of Onset  . Diabetes Mother        type 2  . Coronary artery disease Mother   . Deep vein thrombosis Mother   . Colon cancer Father   . Asthma Brother   . Diabetes Brother        type 2     Current Outpatient  Medications:  .  budesonide-formoterol (SYMBICORT) 80-4.5 MCG/ACT inhaler, Inhale 2 puffs into the lungs 2 (two) times daily., Disp: , Rfl:  .  Calcium Citrate-Vitamin D (CITRACAL/VITAMIN D PO), Take 1 tablet by mouth daily. , Disp: , Rfl:  .  chlorhexidine (PERIDEX) 0.12 % solution, USE AS DIRECTED 15 MLS IN THE MOUTH OR THROAT 2 (TWO) TIMES DAILY. SWISH AND SPIT, Disp: 473 mL, Rfl: 0 .  Cyanocobalamin (VITAMIN B 12 PO), Take 1 tablet by mouth daily. , Disp: , Rfl:  .  feeding supplement (BOOST / RESOURCE BREEZE) LIQD, Take 1 Container by mouth 3 (three) times daily between meals., Disp: 30 Container, Rfl: 0 .  gabapentin (NEURONTIN) 400 MG capsule, TAKE 1 CAPSULE TWICE DAILY, Disp: 180 capsule, Rfl: 4 .  HYDROcodone-acetaminophen (NORCO) 7.5-325 MG tablet, Take 1-2 tablets by mouth every 6 (six) hours as needed for moderate pain., Disp: 120 tablet, Rfl: 0 .  ibuprofen (ADVIL,MOTRIN) 200 MG tablet, Take 400 mg by mouth daily as needed for headache or moderate pain., Disp: , Rfl:  .  lidocaine-prilocaine (EMLA) cream, Apply to affected area once, Disp: 30 g, Rfl: 3 .  LORazepam (ATIVAN) 0.5 MG tablet, Take 1 tablet (0.5 mg total) by mouth every 6 (six) hours as needed (Nausea or vomiting)., Disp: 30 tablet, Rfl: 0 .  magnesium chloride (SLOW-MAG)  64 MG TBEC SR tablet, Take 1 tablet by mouth daily., Disp: , Rfl:  .  meloxicam (MOBIC) 15 MG tablet, TAKE 1 TABLET EVERY DAY AS NEEDED, Disp: 90 tablet, Rfl: 4 .  omeprazole (PRILOSEC) 20 MG capsule, TAKE 1 CAPSULE EVERY DAY, Disp: 90 capsule, Rfl: 4 .  ondansetron (ZOFRAN) 8 MG tablet, Take 1 tablet (8 mg total) by mouth 2 (two) times daily as needed for refractory nausea / vomiting. Start on day 3 after chemotherapy., Disp: 30 tablet, Rfl: 1 .  pravastatin (PRAVACHOL) 40 MG tablet, TAKE 1 TABLET (40 MG TOTAL) BY MOUTH DAILY., Disp: 90 tablet, Rfl: 4 .  Probiotic Product (PROBIOTIC DAILY PO), Take 1 capsule by mouth daily. , Disp: , Rfl:  .  prochlorperazine (COMPAZINE) 10 MG tablet, Take 1 tablet (10 mg total) by mouth every 6 (six) hours as needed (Nausea or vomiting)., Disp: 30 tablet, Rfl: 1 .  pyridOXINE (VITAMIN B-6) 50 MG tablet, Take 1 tablet (50 mg total) by mouth daily., Disp: 30 tablet, Rfl: 3 .  simethicone (MYLICON) 80 MG chewable tablet, Chew 2 tablets (160 mg total) by mouth every 6 (six) hours as needed for flatulence., Disp: 120 tablet, Rfl: 0 .  Triamcinolone Acetonide (NASACORT ALLERGY 24HR NA), Place 1 spray into the nose daily as needed (allergies)., Disp: , Rfl:  No current facility-administered medications for this visit.   Facility-Administered Medications Ordered in Other Visits:  .  0.9 %  sodium chloride infusion, , Intravenous, Continuous, Earlie Server, MD, Stopped at 07/22/17 1150 .  heparin lock flush 100 unit/mL, 500 Units, Intravenous, Once, Earlie Server, MD .  heparin lock flush 100 unit/mL, 500 Units, Intravenous, Once, Earlie Server, MD .  heparin lock flush 100 unit/mL, 500 Units, Intravenous, Once, Earlie Server, MD .  sodium chloride flush (NS) 0.9 % injection 10 mL, 10 mL, Intracatheter, PRN, Earlie Server, MD .  sodium chloride flush (NS) 0.9 % injection 10 mL, 10 mL, Intravenous, PRN, Earlie Server, MD, 10 mL at 05/29/17 1347  Physical exam: There were no vitals filed for this  visit. Physical Exam  Constitutional: She is oriented to person, place, and  time. She appears well-developed and well-nourished.  HENT:  Head: Normocephalic and atraumatic.  Eyes: Pupils are equal, round, and reactive to light.  Neck: Normal range of motion. Neck supple.  Cardiovascular: Normal rate and regular rhythm.  Pulmonary/Chest: Effort normal and breath sounds normal.  Abdominal: Soft. Bowel sounds are normal.  Musculoskeletal: She exhibits no edema.  Neurological: She is alert and oriented to person, place, and time.  Skin: Skin is warm and dry.  Wound overlays 1cm superior to port site. 50m scabbed area. Mild erythema surrounding. Small volume of white/yellow drainage.      CMP Latest Ref Rng & Units 12/04/2017  Glucose 65 - 99 mg/dL 111(H)  BUN 6 - 20 mg/dL 9  Creatinine 0.44 - 1.00 mg/dL 0.58  Sodium 135 - 145 mmol/L 139  Potassium 3.5 - 5.1 mmol/L 3.9  Chloride 101 - 111 mmol/L 103  CO2 22 - 32 mmol/L 27  Calcium 8.9 - 10.3 mg/dL 8.1(L)  Total Protein 6.5 - 8.1 g/dL -  Total Bilirubin 0.3 - 1.2 mg/dL -  Alkaline Phos 38 - 126 U/L -  AST 15 - 41 U/L -  ALT 14 - 54 U/L -   CBC Latest Ref Rng & Units 01/21/2018  WBC 3.6 - 11.0 K/uL 8.8  Hemoglobin 12.0 - 16.0 g/dL 14.5  Hematocrit 35.0 - 47.0 % 42.0  Platelets 150 - 440 K/uL 335    Assessment and plan- Patient is a 72y.o. female with stage IIIb colon cancer who presents [facility] for skin infection  1. Stage IIIb Colon cancer- malignant neoplasm of transverse colon. Has completed adjuvant 5-FU. Currently receiving adjuvant radiation. Follow-up with Dr. YTasia Catchingsas scheduled.   2. Skin infection at port site- appears that patient has scratched at her skin overlying port and likely secondary skin infection. Mild but given actively receiving treatment will treat with doxycycline 1070mBID x 7 days.  I have asked that she practice good hand hygiene and avoid scratching or touching the site as this may be the  cause of skin infection.   RTC on 02/18/18 for labs and re-evaluation with Dr. YuTasia CatchingsIf symptoms do not improve over next 2-3 days, have asked patient to call clinic for re-evaluation. She expresses understanding and is in agreement with this plan.  She also understands that she can call the clinic at any time with questions, concerns, or complaints.   Visit Diagnosis 1. Malignant neoplasm of transverse colon ([facility]  2. Local skin infection     LaBeckey RutterDNP, AGNP-C [facility] cell) [phone]) 01/27/18 12:18 PM

## 2018-01-27 NOTE — Patient Instructions (Addendum)
Skin Abscess A skin abscess is an infected area on or under your skin that contains a collection of pus and other material. An abscess may also be called a furuncle, carbuncle, or boil. An abscess can occur in or on almost any part of your body. Some abscesses break open (rupture) on their own. Most continue to get worse unless they are treated. The infection can spread deeper into the body and eventually into your blood, which can make you feel ill. Treatment usually involves draining the abscess. What are the causes? An abscess occurs when germs, often bacteria, pass through your skin and cause an infection. This may be caused by:  A scrape or cut on your skin.  A puncture wound through your skin, including a needle injection.  Blocked oil or sweat glands.  Blocked and infected hair follicles.  A cyst that forms beneath your skin (sebaceous cyst) and becomes infected.  What increases the risk? This condition is more likely to develop in people who:  Have a weak body defense system (immune system).  Have diabetes.  Have dry and irritated skin.  Get frequent injections or use illegal IV drugs.  Have a foreign body in a wound, such as a splinter.  Have problems with their lymph system or veins.  What are the signs or symptoms? An abscess may start as a painful, firm bump under the skin. Over time, the abscess may get larger or become softer. Pus may appear at the top of the abscess, causing pressure and pain. It may eventually break through the skin and drain. Other symptoms include:  Redness.  Warmth.  Swelling.  Tenderness.  A sore on the skin.  How is this diagnosed? This condition is diagnosed based on your medical history and a physical exam. A sample of pus may be taken from the abscess to find out what is causing the infection and what antibiotics can be used to treat it. You also may have:  Blood tests to look for signs of infection or spread of an infection to  your blood.  Imaging studies such as ultrasound, CT scan, or MRI if the abscess is deep.  How is this treated? Small abscesses that drain on their own may not need treatment. Treatment for an abscess that does not rupture on its own may include:  Warm compresses applied to the area several times per day.  Incision and drainage. Your health care provider will make an incision to open the abscess and will remove pus and any foreign body or dead tissue. The incision area may be packed with gauze to keep it open for a few days while it heals.  Antibiotic medicines to treat infection. For a severe abscess, you may first get antibiotics through an IV and then change to oral antibiotics.  Follow these instructions at home: Abscess Care  If you have an abscess that has not drained, place a warm, clean, wet washcloth over the abscess several times a day. Do this as told by your health care provider.  Follow instructions from your health care provider about how to take care of your abscess. Make sure you: ? Cover the abscess with a bandage (dressing). ? Change your dressing or gauze as told by your health care provider. ? Wash your hands with soap and water before you change the dressing or gauze. If soap and water are not available, use hand sanitizer.  Check your abscess every day for signs of a worsening infection. Check for: ?  More redness, swelling, or pain. ? More fluid or blood. ? Warmth. ? More pus or a bad smell. Medicines  Take over-the-counter and prescription medicines only as told by your health care provider.  If you were prescribed an antibiotic medicine, take it as told by your health care provider. Do not stop taking the antibiotic even if you start to feel better. General instructions  To avoid spreading the infection: ? Do not share personal care items, towels, or hot tubs with others. ? Avoid making skin contact with other people.  Keep all follow-up visits as told by  your health care provider. This is important. Contact a health care provider if:  You have more redness, swelling, or pain around your abscess.  You have more fluid or blood coming from your abscess.  Your abscess feels warm to the touch.  You have more pus or a bad smell coming from your abscess.  You have a fever.  You have muscle aches.  You have chills or a general ill feeling. Get help right away if:  You have severe pain.  You see red streaks on your skin spreading away from the abscess. This information is not intended to replace advice given to you by your health care provider. Make sure you discuss any questions you have with your health care provider. Document Released: 06/26/2005 Document Revised: 05/12/2016 Document Reviewed: 07/26/2015 Elsevier Interactive Patient Education  2018 [employer].   Doxycycline tablets or capsules What is this medicine? DOXYCYCLINE (dox i SYE kleen) is a tetracycline antibiotic. It kills certain bacteria or stops their growth. It is used to treat many kinds of infections, like dental, skin, respiratory, and urinary tract infections. It also treats acne, Lyme disease, malaria, and certain sexually transmitted infections. This medicine may be used for other purposes; ask your health care provider or pharmacist if you have questions. COMMON BRAND NAME(S): Acticlate, Adoxa, Adoxa CK, Adoxa Pak, Adoxa TT, Alodox, Avidoxy, Doxal, Mondoxyne NL, Monodox, Morgidox 1x, Morgidox 1x Kit, Morgidox 2x, Morgidox 2x Kit, NutriDox, Ocudox, TARGADOX, Vibra-Tabs, Vibramycin What should I tell my health care provider before I take this medicine? They need to know if you have any of these conditions: -liver disease -long exposure to sunlight like working outdoors -stomach problems like colitis -an unusual or allergic reaction to doxycycline, tetracycline antibiotics, other medicines, foods, dyes, or preservatives -pregnant or trying to get  pregnant -breast-feeding How should I use this medicine? Take this medicine by mouth with a full glass of water. Follow the directions on the prescription label. Take this medication with food as it may upset your stomach. Take your medicine at regular intervals. Do not take your medicine more often than directed. Take all of your medicine as directed even if you think you are better. Do not skip doses or stop your medicine early. Talk to your pediatrician regarding the use of this medicine in children. While this drug may be prescribed for selected conditions, precautions do apply. Overdosage: If you think you have taken too much of this medicine contact a poison control center or emergency room at once. NOTE: This medicine is only for you. Do not share this medicine with others. What if I miss a dose? If you miss a dose, take it as soon as you can. If it is almost time for your next dose, take only that dose. Do not take double or extra doses. What may interact with this medicine? -antacids -barbiturates -birth control pills -bismuth subsalicylate -carbamazepine -methoxyflurane -other  antibiotics -phenytoin -vitamins that contain iron -warfarin This list may not describe all possible interactions. Give your health care provider a list of all the medicines, herbs, non-prescription drugs, or dietary supplements you use. Also tell them if you smoke, drink alcohol, or use illegal drugs. Some items may interact with your medicine. What should I watch for while using this medicine? Tell your doctor or health care professional if your symptoms do not improve. Do not treat diarrhea with over the counter products. Contact your doctor if you have diarrhea that lasts more than 2 days or if it is severe and watery. Do not take this medicine just before going to bed. It may not dissolve properly when you lay down and can cause pain in your throat. Drink plenty of fluids while taking this medicine to  also help reduce irritation in your throat. This medicine can make you more sensitive to the sun. Keep out of the sun. If you cannot avoid being in the sun, wear protective clothing and use sunscreen. Do not use sun lamps or tanning beds/booths. Birth control pills may not work properly while you are taking this medicine. Talk to your doctor about using an extra method of birth control. If you are being treated for a sexually transmitted infection, avoid sexual contact until you have finished your treatment. Your sexual partner may also need treatment. Avoid antacids, aluminum, calcium, magnesium, and iron products for 4 hours before and 2 hours after taking a dose of this medicine. If you are using this medicine to prevent malaria, you should still protect yourself from contact with mosquitos. Stay in screened-in areas, use mosquito nets, keep your body covered, and use an insect repellent. What side effects may I notice from receiving this medicine? Side effects that you should report to your doctor or health care professional as soon as possible: -allergic reactions like skin rash, itching or hives, swelling of the face, lips, or tongue -difficulty breathing -fever -itching in the rectal or genital area -pain on swallowing -redness, blistering, peeling or loosening of the skin, including inside the mouth -severe stomach pain or cramps -unusual bleeding or bruising -unusually weak or tired -yellowing of the eyes or skin Side effects that usually do not require medical attention (report to your doctor or health care professional if they continue or are bothersome): -diarrhea -loss of appetite -nausea, vomiting This list may not describe all possible side effects. Call your doctor for medical advice about side effects. You may report side effects to FDA at 1-800-FDA-1088. Where should I keep my medicine? Keep out of the reach of children. Store at room temperature, below 30 degrees C (86  degrees F). Protect from light. Keep container tightly closed. Throw away any unused medicine after the expiration date. Taking this medicine after the expiration date can make you seriously ill. NOTE: This sheet is a summary. It may not cover all possible information. If you have questions about this medicine, talk to your doctor, pharmacist, or health care provider.  2018 Elsevier/Gold Standard (2015-10-18 17:11:22)

## 2018-01-28 ENCOUNTER — Ambulatory Visit
Admission: RE | Admit: 2018-01-28 | Discharge: 2018-01-28 | Disposition: A | Discharge status: Home / Self Care | Payer: Medicare HMO | Source: Ambulatory Visit | Attending: Radiation Oncology | Admitting: Radiation Oncology

## 2018-01-28 ENCOUNTER — Inpatient Hospital Stay: Payer: Medicare HMO | Attending: Oncology

## 2018-01-28 DIAGNOSIS — Z510 Encounter for antineoplastic radiation therapy: Secondary | ICD-10-CM | POA: Insufficient documentation

## 2018-01-28 DIAGNOSIS — C184 Malignant neoplasm of transverse colon: Secondary | ICD-10-CM | POA: Diagnosis not present

## 2018-01-28 DIAGNOSIS — C779 Secondary and unspecified malignant neoplasm of lymph node, unspecified: Secondary | ICD-10-CM | POA: Diagnosis not present

## 2018-01-28 DIAGNOSIS — Z87891 Personal history of nicotine dependence: Secondary | ICD-10-CM | POA: Diagnosis not present

## 2018-01-29 ENCOUNTER — Ambulatory Visit
Admission: RE | Admit: 2018-01-29 | Discharge: 2018-01-29 | Disposition: A | Discharge status: Home / Self Care | Payer: Medicare HMO | Source: Ambulatory Visit | Attending: Radiation Oncology | Admitting: Radiation Oncology

## 2018-01-29 ENCOUNTER — Inpatient Hospital Stay: Payer: Medicare HMO

## 2018-01-29 DIAGNOSIS — Z87891 Personal history of nicotine dependence: Secondary | ICD-10-CM | POA: Diagnosis not present

## 2018-01-29 DIAGNOSIS — Z510 Encounter for antineoplastic radiation therapy: Secondary | ICD-10-CM | POA: Diagnosis not present

## 2018-01-29 DIAGNOSIS — C184 Malignant neoplasm of transverse colon: Secondary | ICD-10-CM | POA: Diagnosis not present

## 2018-01-29 DIAGNOSIS — C779 Secondary and unspecified malignant neoplasm of lymph node, unspecified: Secondary | ICD-10-CM | POA: Diagnosis not present

## 2018-01-30 ENCOUNTER — Ambulatory Visit
Admission: RE | Admit: 2018-01-30 | Discharge: 2018-01-30 | Disposition: A | Discharge status: Home / Self Care | Payer: Medicare HMO | Source: Ambulatory Visit | Attending: Radiation Oncology | Admitting: Radiation Oncology

## 2018-01-30 DIAGNOSIS — Z510 Encounter for antineoplastic radiation therapy: Secondary | ICD-10-CM | POA: Diagnosis not present

## 2018-01-30 DIAGNOSIS — C184 Malignant neoplasm of transverse colon: Secondary | ICD-10-CM | POA: Diagnosis not present

## 2018-02-01 ENCOUNTER — Ambulatory Visit: Admission: RE | Admit: 2018-02-01 | Payer: Medicare HMO | Source: Ambulatory Visit

## 2018-02-02 ENCOUNTER — Ambulatory Visit
Admission: RE | Admit: 2018-02-02 | Discharge: 2018-02-02 | Disposition: A | Discharge status: Home / Self Care | Payer: Medicare HMO | Source: Ambulatory Visit | Attending: Radiation Oncology | Admitting: Radiation Oncology

## 2018-02-02 ENCOUNTER — Encounter: Payer: Self-pay | Admitting: Nurse Practitioner

## 2018-02-02 ENCOUNTER — Ambulatory Visit: Payer: Medicare HMO

## 2018-02-02 DIAGNOSIS — Z510 Encounter for antineoplastic radiation therapy: Secondary | ICD-10-CM | POA: Diagnosis not present

## 2018-02-02 DIAGNOSIS — C184 Malignant neoplasm of transverse colon: Secondary | ICD-10-CM | POA: Diagnosis not present

## 2018-02-03 ENCOUNTER — Ambulatory Visit
Admission: RE | Admit: 2018-02-03 | Discharge: 2018-02-03 | Disposition: A | Discharge status: Home / Self Care | Payer: Medicare HMO | Source: Ambulatory Visit | Attending: Radiation Oncology | Admitting: Radiation Oncology

## 2018-02-03 ENCOUNTER — Ambulatory Visit: Payer: Medicare HMO

## 2018-02-03 DIAGNOSIS — Z510 Encounter for antineoplastic radiation therapy: Secondary | ICD-10-CM | POA: Diagnosis not present

## 2018-02-03 DIAGNOSIS — C184 Malignant neoplasm of transverse colon: Secondary | ICD-10-CM | POA: Diagnosis not present

## 2018-02-03 DIAGNOSIS — C779 Secondary and unspecified malignant neoplasm of lymph node, unspecified: Secondary | ICD-10-CM | POA: Diagnosis not present

## 2018-02-03 DIAGNOSIS — Z87891 Personal history of nicotine dependence: Secondary | ICD-10-CM | POA: Diagnosis not present

## 2018-02-04 ENCOUNTER — Ambulatory Visit
Admission: RE | Admit: 2018-02-04 | Discharge: 2018-02-04 | Disposition: A | Discharge status: Home / Self Care | Payer: Medicare HMO | Source: Ambulatory Visit | Attending: Radiation Oncology | Admitting: Radiation Oncology

## 2018-02-04 DIAGNOSIS — C779 Secondary and unspecified malignant neoplasm of lymph node, unspecified: Secondary | ICD-10-CM | POA: Diagnosis not present

## 2018-02-04 DIAGNOSIS — C184 Malignant neoplasm of transverse colon: Secondary | ICD-10-CM | POA: Diagnosis not present

## 2018-02-04 DIAGNOSIS — Z510 Encounter for antineoplastic radiation therapy: Secondary | ICD-10-CM | POA: Diagnosis not present

## 2018-02-04 DIAGNOSIS — Z87891 Personal history of nicotine dependence: Secondary | ICD-10-CM | POA: Diagnosis not present

## 2018-02-10 ENCOUNTER — Other Ambulatory Visit: Payer: Self-pay | Admitting: Family Medicine

## 2018-02-18 ENCOUNTER — Other Ambulatory Visit: Payer: Self-pay | Admitting: Family Medicine

## 2018-02-18 ENCOUNTER — Encounter: Payer: Self-pay | Admitting: Oncology

## 2018-02-18 ENCOUNTER — Inpatient Hospital Stay (HOSPITAL_BASED_OUTPATIENT_CLINIC_OR_DEPARTMENT_OTHER): Payer: Medicare HMO | Admitting: Oncology

## 2018-02-18 ENCOUNTER — Inpatient Hospital Stay: Payer: Medicare HMO | Attending: Oncology

## 2018-02-18 VITALS — BP 105/72 | HR 95 | Temp 98.2°F | Wt 154.0 lb

## 2018-02-18 DIAGNOSIS — Z853 Personal history of malignant neoplasm of breast: Secondary | ICD-10-CM | POA: Insufficient documentation

## 2018-02-18 DIAGNOSIS — R530 Neoplastic (malignant) related fatigue: Secondary | ICD-10-CM

## 2018-02-18 DIAGNOSIS — Z9221 Personal history of antineoplastic chemotherapy: Secondary | ICD-10-CM | POA: Insufficient documentation

## 2018-02-18 DIAGNOSIS — C184 Malignant neoplasm of transverse colon: Secondary | ICD-10-CM

## 2018-02-18 DIAGNOSIS — D72821 Monocytosis (symptomatic): Secondary | ICD-10-CM

## 2018-02-18 DIAGNOSIS — G629 Polyneuropathy, unspecified: Secondary | ICD-10-CM

## 2018-02-18 DIAGNOSIS — C779 Secondary and unspecified malignant neoplasm of lymph node, unspecified: Secondary | ICD-10-CM | POA: Diagnosis not present

## 2018-02-18 DIAGNOSIS — G620 Drug-induced polyneuropathy: Secondary | ICD-10-CM

## 2018-02-18 DIAGNOSIS — Z79899 Other long term (current) drug therapy: Secondary | ICD-10-CM | POA: Insufficient documentation

## 2018-02-18 DIAGNOSIS — Z800 Family history of malignant neoplasm of digestive organs: Secondary | ICD-10-CM

## 2018-02-18 DIAGNOSIS — Z923 Personal history of irradiation: Secondary | ICD-10-CM | POA: Diagnosis not present

## 2018-02-18 DIAGNOSIS — J441 Chronic obstructive pulmonary disease with (acute) exacerbation: Secondary | ICD-10-CM | POA: Diagnosis not present

## 2018-02-18 DIAGNOSIS — Z87891 Personal history of nicotine dependence: Secondary | ICD-10-CM

## 2018-02-18 DIAGNOSIS — D729 Disorder of white blood cells, unspecified: Secondary | ICD-10-CM

## 2018-02-18 DIAGNOSIS — M5136 Other intervertebral disc degeneration, lumbar region: Secondary | ICD-10-CM

## 2018-02-18 DIAGNOSIS — Z95828 Presence of other vascular implants and grafts: Secondary | ICD-10-CM

## 2018-02-18 LAB — COMPREHENSIVE METABOLIC PANEL
ALT: 14 U/L (ref 14–54)
ANION GAP: 10 (ref 5–15)
AST: 24 U/L (ref 15–41)
Albumin: 3.5 g/dL (ref 3.5–5.0)
Alkaline Phosphatase: 107 U/L (ref 38–126)
BILIRUBIN TOTAL: 0.4 mg/dL (ref 0.3–1.2)
BUN: 9 mg/dL (ref 6–20)
CHLORIDE: 103 mmol/L (ref 101–111)
CO2: 22 mmol/L (ref 22–32)
Calcium: 9.1 mg/dL (ref 8.9–10.3)
Creatinine, Ser: 0.72 mg/dL (ref 0.44–1.00)
GFR calc Af Amer: >60 mL/min (ref >60)
Glucose, Bld: 196 mg/dL — ABNORMAL HIGH (ref 65–99)
POTASSIUM: 3.6 mmol/L (ref 3.5–5.1)
Sodium: 135 mmol/L (ref 135–145)
TOTAL PROTEIN: 7.8 g/dL (ref 6.5–8.1)

## 2018-02-18 LAB — CBC WITH DIFFERENTIAL/PLATELET
BASOS ABS: 0.1 10*3/uL (ref 0–0.1)
Basophils Relative: 1 %
EOS PCT: 2 %
Eosinophils Absolute: 0.2 10*3/uL (ref 0–0.7)
HEMATOCRIT: 41.9 % (ref 35.0–47.0)
Hemoglobin: 14.6 g/dL (ref 12.0–16.0)
LYMPHS ABS: 0.8 10*3/uL — AB (ref 1.0–3.6)
LYMPHS PCT: 9 %
MCH: 33.1 pg (ref 26.0–34.0)
MCHC: 34.9 g/dL (ref 32.0–36.0)
MCV: 94.9 fL (ref 80.0–100.0)
Monocytes Absolute: 1 10*3/uL — ABNORMAL HIGH (ref 0.2–0.9)
Monocytes Relative: 11 %
NEUTROS ABS: 7.1 10*3/uL — AB (ref 1.4–6.5)
NEUTROS PCT: 77 %
Platelets: 298 10*3/uL (ref 150–440)
RBC: 4.41 MIL/uL (ref 3.80–5.20)
RDW: 14.9 % — AB (ref 11.5–14.5)
WBC: 9.3 10*3/uL (ref 3.6–11.0)

## 2018-02-18 MED ORDER — SODIUM CHLORIDE 0.9% FLUSH
10.0000 mL | Freq: Once | INTRAVENOUS | Status: AC
  Administered 2018-02-18: 10 mL via INTRAVENOUS
  Filled 2018-02-18: qty 10

## 2018-02-18 MED ORDER — HYDROCODONE-ACETAMINOPHEN 7.5-325 MG PO TABS: 1.0000 | ORAL_TABLET | Freq: Four times a day (QID) | ORAL | 0 refills | Status: DC | PRN

## 2018-02-18 MED ORDER — HEPARIN SOD (PORK) LOCK FLUSH 100 UNIT/ML IV SOLN
500.0000 [IU] | Freq: Once | INTRAVENOUS | Status: AC
  Administered 2018-02-18: 500 [IU] via INTRAVENOUS

## 2018-02-18 NOTE — Progress Notes (Signed)
[facility] up  Visit:  Patient Care Team: Birdie Sons, MD as PCP - General (Family Medicine) Earlie Server, MD as Consulting Physician (Oncology) Noreene Filbert, MD as Referring Physician (Radiation Oncology) Jules Husbands, MD as Consulting Physician (General Surgery)  PURPOSE OF VISIT: Adjuvant chemotherapy for colon cancer  HISTORY OF PRESENTING ILLNESS: Kimberly [name] 72 y.o. female with PMH listed as below presents for follow up for the management of Stage IIIB Colon Cancer Patient reports a remote history of breast cancer s/p lumpectomy and radiation treatments. Patient had been having abdominal pain, weight loss and bloating.  CT scan showed partial obstruction at the level of transverse colon and ileocolic mesenteric adenopathy. She was prepared for a colonoscopy on 04/15/2017. She started to have worsened abdominal pain, nausea vomiting, unable to maintain oral intake and was sent to ED. She was found to have bowel obstruction and underwent  exploratory laparotomy with transverse colectomy, with end colostomy and mucous fistula formation for obstructing colon lesion. Differential diagnosis prior to surgery was metastatic breast cancer in colon vs colon cancer. The patient did have a colonoscopy 2 years ago without the mass being present at that time but did have 2 small polyps in the transverse colon that were removed.  04/15/2017 Patient underwent transverse colon resection.  Pathology showed T4aN1 moderate differentiated adenocarcinoma, negative margin, 3/16 lymph nodes involved with cancer. Low probability of MSI-H.   # Genetic test INVITAE came back negative. No pathogenetic sequence variants or deletions/duplications identified. Results was scanned to [facility] (a copy of the report was provided to patient and her daughter)  # # baseline CEA is 2.1  TREATMENT: adjuvant FOLFOX, Oxaliplatin was discontinued after 3 cycles due to worsening of pre-existing  neuropathy. Finished another 9 cycles of 5-FU.  S/p adjuvant RT.   INTERVAL HISTORY Patient presents for follow up of colon cancer. S/p RT.  During the interval, she has had a skin infection around her port site. Treated with a course of antibiotics.  Otherwise she reports doing well, except chronic fatigue. She will meet with Surgeon to discuss about colostomy reversal.     Denies any fever or chills.  Appetite is fair.  She has ongoing chronic pre-existing neuropathy. .  . Review of Systems  Constitutional: Positive for fatigue. Negative for chills, fever and unexpected weight change.  HENT:   Negative for hearing loss, lump/mass and nosebleeds.   Eyes: Negative for eye problems.  Respiratory: Negative for chest tightness, cough and shortness of breath.   Cardiovascular: Negative for chest pain and leg swelling.  Gastrointestinal: Negative for abdominal distention, abdominal pain, constipation and nausea.  Endocrine: Negative for hot flashes.  Genitourinary: Negative for difficulty urinating, dysuria and frequency.   Musculoskeletal: Negative for arthralgias, gait problem and myalgias.  Skin: Negative for rash and wound.  Neurological: Negative for dizziness, gait problem and headaches.       Chronic numbness and tingling of fingertips and toes.  Hematological: Negative for adenopathy. Does not bruise/bleed easily.  Psychiatric/Behavioral: Negative for confusion, decreased concentration and depression. The patient is not nervous/anxious.       MEDICAL HISTORY: Past Medical History:  Diagnosis Date  . Breast cancer, left (HCC)    Lumpectomy and rad tx's.  . Chronic back pain   . Colon cancer ([place])   . Degenerative disc disease, lumbar 04/2013  . Dyspnea   . Family history of adverse reaction to anesthesia    son arrested after anesthesia  .  GERD (gastroesophageal reflux disease)   . Hyperlipidemia   . Iron deficiency anemia 05/27/2017  . Neuropathy   . Osteoarthritis    of  knee  . Osteoporosis   . Tobacco abuse   . Vitamin D deficiency     SURGICAL HISTORY: Past Surgical History:  Procedure Laterality Date  . APPENDECTOMY  1965  . BREAST EXCISIONAL BIOPSY Left 08/01/2003   lumpectomy rad 11/04-2/28/2005  . CESAREAN SECTION     x3  . COLON SURGERY    . COLONOSCOPY W/ POLYPECTOMY    . ESOPHAGOGASTRODUODENOSCOPY (EGD) WITH PROPOFOL N/A 04/04/2017   Procedure: ESOPHAGOGASTRODUODENOSCOPY (EGD) WITH PROPOFOL;  Surgeon: [name], MD;  Location: [facility];  Service: Endoscopy;  Laterality: N/A;  . HIP FRACTURE SURGERY Left 01/25/2012  . LAPAROTOMY N/A 04/15/2017   Procedure: EXPLORATORY LAPAROTOMY;  Surgeon: Clayburn Pert, MD;  Location: [facility];  Service: General;  Laterality: N/A;  . NECK SURGERY  12/2011  . PARASTOMAL HERNIA REPAIR N/A 12/03/2017   Procedure: HERNIA REPAIR PARASTOMAL;  Surgeon: Jules Husbands, MD;  Location: [facility];  Service: General;  Laterality: N/A;  . PORTACATH PLACEMENT Right 05/21/2017   Procedure: INSERTION PORT-A-CATH;  Surgeon: Clayburn Pert, MD;  Location: [facility];  Service: General;  Laterality: Right;  . WRIST FRACTURE SURGERY      SOCIAL HISTORY: Social History   Socioeconomic History  . Marital status: Divorced    Spouse name: Not on file  . Number of children: 3  . Years of education: Not on file  . Highest education level: 7th grade  Occupational History  . Occupation: retired  Scientific laboratory technician  . Financial resource strain: Not hard at all  . Food insecurity:    Worry: Never true    Inability: Never true  . Transportation needs:    Medical: No    Non-medical: No  Tobacco Use  . Smoking status: Former Smoker    Packs/day: 0.25    Years: 55.00    Pack years: 13.75    Types: Cigarettes    Last attempt to quit: 05/21/2017    Years since quitting: 0.7  . Smokeless tobacco: Never Used  . Tobacco comment: started age 89 1/2 to 1 ppd  Substance and Sexual Activity  . Alcohol use: Yes     Alcohol/week: 0.0 oz    Comment: occasionally drinks beer  . Drug use: No  . Sexual activity: Never  Lifestyle  . Physical activity:    Days per week: Not on file    Minutes per session: Not on file  . Stress: Not at all  Relationships  . Social connections:    Talks on phone: Not on file    Gets together: Not on file    Attends religious service: Not on file    Active member of club or organization: Not on file    Attends meetings of clubs or organizations: Not on file    Relationship status: Not on file  . Intimate partner violence:    Fear of current or ex partner: Not on file    Emotionally abused: Not on file    Physically abused: Not on file    Forced sexual activity: Not on file  Other Topics Concern  . Not on file  Social History Narrative  . Not on file    FAMILY HISTORY Family History  Problem Relation Age of Onset  . Diabetes Mother        type 2  . Coronary artery disease  Mother   . Deep vein thrombosis Mother   . Colon cancer Father   . Asthma Brother   . Diabetes Brother        type 2    ALLERGIES:  is allergic to betadine [povidone iodine]; other; and sinus formula [cholestatin].  MEDICATIONS:  Current Outpatient Medications  Medication Sig Dispense Refill  . budesonide-formoterol (SYMBICORT) 80-4.5 MCG/ACT inhaler Inhale 2 puffs into the lungs 2 (two) times daily.    . Calcium Citrate-Vitamin D (CITRACAL/VITAMIN D PO) Take 1 tablet by mouth daily.     . chlorhexidine (PERIDEX) 0.12 % solution USE AS DIRECTED 15 MLS IN THE MOUTH OR THROAT 2 (TWO) TIMES DAILY. SWISH AND SPIT 473 mL 0  . Cyanocobalamin (VITAMIN B 12 PO) Take 1 tablet by mouth daily.     . feeding supplement (BOOST / RESOURCE BREEZE) LIQD Take 1 Container by mouth 3 (three) times daily between meals. 30 Container 0  . gabapentin (NEURONTIN) 400 MG capsule TAKE 1 CAPSULE TWICE DAILY 180 capsule 4  . HYDROcodone-acetaminophen (NORCO) 7.5-325 MG tablet Take 1-2 tablets by mouth every 6  (six) hours as needed for moderate pain. 120 tablet 0  . ibuprofen (ADVIL,MOTRIN) 200 MG tablet Take 400 mg by mouth daily as needed for headache or moderate pain.    Marland Kitchen lidocaine-prilocaine (EMLA) cream Apply to affected area once 30 g 3  . LORazepam (ATIVAN) 0.5 MG tablet Take 1 tablet (0.5 mg total) by mouth every 6 (six) hours as needed (Nausea or vomiting). 30 tablet 0  . magnesium chloride (SLOW-MAG) 64 MG TBEC SR tablet Take 1 tablet by mouth daily.    . meloxicam (MOBIC) 15 MG tablet TAKE 1 TABLET EVERY DAY AS NEEDED 90 tablet 4  . omeprazole (PRILOSEC) 20 MG capsule TAKE 1 CAPSULE EVERY DAY 90 capsule 4  . ondansetron (ZOFRAN) 8 MG tablet Take 1 tablet (8 mg total) by mouth 2 (two) times daily as needed for refractory nausea / vomiting. Start on day 3 after chemotherapy. 30 tablet 1  . pravastatin (PRAVACHOL) 40 MG tablet TAKE 1 TABLET (40 MG TOTAL) BY MOUTH DAILY. 90 tablet 4  . Probiotic Product (PROBIOTIC DAILY PO) Take 1 capsule by mouth daily.     . prochlorperazine (COMPAZINE) 10 MG tablet Take 1 tablet (10 mg total) by mouth every 6 (six) hours as needed (Nausea or vomiting). 30 tablet 1  . pyridOXINE (VITAMIN B-6) 50 MG tablet Take 1 tablet (50 mg total) by mouth daily. 30 tablet 3  . simethicone (MYLICON) 80 MG chewable tablet Chew 2 tablets (160 mg total) by mouth every 6 (six) hours as needed for flatulence. 120 tablet 0  . Triamcinolone Acetonide (NASACORT ALLERGY 24HR NA) Place 1 spray into the nose daily as needed (allergies).     No current facility-administered medications for this visit.    Facility-Administered Medications Ordered in Other Visits  Medication Dose Route Frequency Provider Last Rate Last Dose  . 0.9 %  sodium chloride infusion   Intravenous Continuous Earlie Server, MD   Stopped at 07/22/17 1150  . heparin lock flush 100 unit/mL  500 Units Intravenous Once Earlie Server, MD      . heparin lock flush 100 unit/mL  500 Units Intravenous Once Earlie Server, MD      .  heparin lock flush 100 unit/mL  500 Units Intravenous Once Earlie Server, MD      . sodium chloride flush (NS) 0.9 % injection 10 mL  10 mL Intracatheter  PRN Earlie Server, MD      . sodium chloride flush (NS) 0.9 % injection 10 mL  10 mL Intravenous PRN Earlie Server, MD   10 mL at 05/29/17 1347    PHYSICAL EXAMINATION:  ECOG PERFORMANCE STATUS: 0 - Asymptomatic  Vitals:   02/18/18 1532  BP: 105/72  Pulse: 95  Temp: 98.2 F (36.8 C)   Filed Weights   02/18/18 1532  Weight: 154 lb (69.9 kg)    Physical Exam  Constitutional: She is oriented to person, place, and time and well-developed, well-nourished, and in no distress. No distress.  HENT:  Head: Normocephalic and atraumatic.  Mouth/Throat: Oropharynx is clear and moist. No oropharyngeal exudate.  Eyes: Pupils are equal, round, and reactive to light. Conjunctivae and EOM are normal. Left eye exhibits no discharge. No scleral icterus.  Neck: Normal range of motion. Neck supple.  Cardiovascular: Normal rate, regular rhythm and normal heart sounds.  No murmur heard. Pulmonary/Chest: Effort normal. No respiratory distress. She has wheezes.  Abdominal: Soft. Bowel sounds are normal. She exhibits no distension and no mass. There is no tenderness. There is no rebound.  (+)Colostomy  Musculoskeletal: Normal range of motion. She exhibits no edema.  Lymphadenopathy:    She has no cervical adenopathy.  Neurological: She is alert and oriented to person, place, and time. No cranial nerve deficit. Gait normal.  Skin: Skin is warm and dry. No erythema.  Psychiatric: Affect and judgment normal.      LABORATORY DATA: I have personally reviewed the data as listed: CBC Latest Ref Rng & Units 02/18/2018 01/21/2018 01/14/2018  WBC 3.6 - 11.0 K/uL 9.3 8.8 9.4  Hemoglobin 12.0 - 16.0 g/dL 14.6 14.5 14.0  Hematocrit 35.0 - 47.0 % 41.9 42.0 40.7  Platelets 150 - 440 K/uL 298 335 375   CMP Latest Ref Rng & Units 02/18/2018 12/04/2017 12/02/2017  Glucose 65 - 99  mg/dL 196(H) 111(H) 88  BUN 6 - 20 mg/dL _0 Creatinine 0.44 - 1.00 mg/dL 0.72 0.58 0.71  Sodium 135 - 145 mmol/L 135 139 139  Potassium 3.5 - 5.1 mmol/L 3.6 3.9 3.9  Chloride 101 - 111 mmol/L 103 103 101  CO2 22 - 32 mmol/L _1 Calcium 8.9 - 10.3 mg/dL 9.1 8.1(L) 8.6(L)  Total Protein 6.5 - 8.1 g/dL 7.8 - -  Total Bilirubin 0.3 - 1.2 mg/dL 0.4 - -  Alkaline Phos 38 - 126 U/L 107 - -  AST 15 - 41 U/L 24 - -  ALT 14 - 54 U/L 14 - -    RADIOGRAPHIC STUDIES: I have personally reviewed the radiological images as listed and agree with the findings in the report 04/08/2017 CT abdomen pelvis w contrast IMPRESSION: 1. Partial obstruction at the level of the transverse colon, likely secondary to carcinoma. 2. Ileocolic mesenteric adenopathy, suspicious for nodal metastasis. 3.  Aortic Atherosclerosis (ICD10-I70.0). 4. Bilateral adrenal adenomas. 04/21/2017 CT abdomen pelvis with contrast. IMPRESSION: Postsurgical changes consistent with the given clinical history. A small amount of fluid in a year is noted surrounding the right lower quadrant ostomy likely felt to be postoperative in nature. No definitive abscess is seen. The patient's palpable abnormality likely corresponds to the small fluid collection.  PATHOLOGY DIAGNOSIS:  A. COLON MASS, TRANSVERSE; TRANSVERSE COLECTOMY:  - INVASIVE ADENOCARCINOMA, MODERATELY DIFFERENTIATED.  - THREE OF SIXTEEN LYMPH NODES INVOLVED BY METASTASIS (3/16).  - LYMPHOVASCULAR AND PERINEURAL INVASION PRESENT.  - SEE SUMMARY BELOW.  Surgical Pathology Cancer Case  Summary  COLON AND RECTUM:  Procedure: transverse colectomy  Tumor Site:  transverse colon  Tumor Size: Greatest dimension: 2.3 cm  Macroscopic Tumor Perforation: not specified  Histologic Type: adenocarcinoma  Histologic Grade: G2, moderately differentiated  Tumor Extension: tumor invades the visceral peritoneum  Margins: all margins are uninvolved by invasive carcinoma,  high-grade dysplasia, intramucosal adenocarcinoma, and adenoma  Treatment Effect: no known presurgical therapy  Lymphovascular Invasion: present  Perineural Invasion: present  Tumor Deposits: not identified  Regional Lymph Nodes: # examined: 16  # involved: 3  Pathologic Stage Classification (pTNM, AJCC 8th Edition): pT4a pN1b   ADDENDUM:   MICROSATELLITE INSTABILITY IMMUNOHISTOCHEMISTRY  MISMATCH REPAIR PROTEINS:   MLH1: Intact nuclear expression  MSH2: Intact nuclear expression  MSH6: Intact nuclear expression  PMS2: Intact nuclear expression   Interpretation: No loss of nuclear expression of mismatch repair  proteins: Low probability of MSI-H.    ASSESSMENT/PLAN 72yo female who has MSI  stable stage IIIB Colon Cancer finishing adjuvant 5-FU   Cancer Staging Malignant neoplasm of transverse colon ([place]) Staging form: Colon and Rectum, AJCC 8th Edition - Clinical stage from 04/15/2017: Stage IIIB (cT4a, cN1b, cM0) - Signed by Earlie Server, MD on 04/26/2017  1. Malignant neoplasm of transverse colon ([place])   2. COPD exacerbation ([place])   3. Port-A-Cath in place   4. Monocytosis   5. Neuropathy   6. Neutrophilia    #Stage III colon cancer, S/p Adjuvant chemotherapy and RT. Doing well. Stable. CBC CMP reviewed. CEA is pending.  Plan CT chest abdomen Pelvis in 3 months.  # Persistent neutrophilia and monocytosis, BCR ABL. FISH of peripheral blood came back negative. Flow cytometry showed absolute monocytosis with phenotypic aberrancy. Neutrophil count is stably high, and monocyte counts decreased.  Continue monitor.  Plan bone marrow biopsy in the future.  # Port flush every 6-8 weeks x 5, if next CT is stable, plan discontinue.  # Wheezing, likely due to COPD. Refer to [facility] for further evaluation.   # Grade 2 neuropathy: Pre-existing peripheral neuropathy, stable. Continue B6 supplementation.  She declined Neurontin or Cymbalta. Other options discussed and she will let  me know if she is interested.    Follow up in  3 months.  Patient knows to call if any questions or concerns.  Earlie Server, MD, PhD Hematology Oncology [facility] Pager- [phone] 02/18/18

## 2018-02-19 LAB — CEA: CEA: 2 ng/mL (ref 0.0–4.7)

## 2018-02-25 ENCOUNTER — Ambulatory Visit (INDEPENDENT_AMBULATORY_CARE_PROVIDER_SITE_OTHER): Payer: Medicare HMO | Admitting: Internal Medicine

## 2018-02-25 ENCOUNTER — Encounter: Payer: Self-pay | Admitting: Internal Medicine

## 2018-02-25 VITALS — BP 106/72 | HR 93 | Ht 65.0 in | Wt 157.0 lb

## 2018-02-25 DIAGNOSIS — R05 Cough: Secondary | ICD-10-CM | POA: Diagnosis not present

## 2018-02-25 DIAGNOSIS — J449 Chronic obstructive pulmonary disease, unspecified: Secondary | ICD-10-CM | POA: Diagnosis not present

## 2018-02-25 DIAGNOSIS — R059 Cough, unspecified: Secondary | ICD-10-CM

## 2018-02-25 NOTE — Progress Notes (Signed)
[facility] Consultation      Assessment and Plan:  Dyspnea on exertion with COPD.  -Mild dyspnea on exertion, with excess mucus production, predominantly chronic bronchitis phenotype.. - We will increase Symbicort to 2 puffs twice daily.  If not doing better next visit with switch to an anticholinergic. - Continue Mucinex as needed to help with mucus clearance.  Chronic/allergic rhinitis. - Currently on Nasacort taking it regularly. - Reiterated the importance of using Nasacort regularly, she is asked to take 2 sprays in each nostril every night.     Date: 02/25/2018  MRN# [id number] Kimberly [name] 1946/05/21  Referring Physician: Dr. Robet Leu is a 72 y.o. old female seen in consultation for chief complaint of:    Chief Complaint  Patient presents with  . Consult    per [place]:  . COPD    prod cough w/yellow mucus: wheezing    HPI:   The patient is a 72 year old female with a history of stage IIIb colon cancer, as well as COPD. She notes that she has periodic congestion, it is worse in the the spring and fall. She takes mucinex, it helps her bring the phlegm up, but feels that it is expensive. Allegra gives her stomach upset. She takes symbicort 2 puffs once per day and feels that it helps.  She has 2 dogs at home, they sleep in bed in her. She has never been tested for allergies.  She has gerd controlled with prilosec.  She has sinus drainage.  She was a previous smoker, she last smoked about a year ago, smoked about a ppd. She is retired from a Clinical cytogeneticist.  She lives by herself, she cleans her own house, she tends her own yard. She lives independently and drives a car. She can do her stairs in her home without difficulty, can walk a [employer] without difficulty.   **Imaging personally reviewed, chest x-ray 05/21/2017 mild hyperinflation suggestive of COPD. CBC; absolute eosinophil count remains around 200-400.  **In office serology  performed today, FVC is 88% predicted, FEV1 of 86% predicted ratio 76%.  Overall this test shows normal pulmonary functions.  PMHX:   Past Medical History:  Diagnosis Date  . Breast cancer, left (HCC)    Lumpectomy and rad tx's.  . Chronic back pain   . Colon cancer ([place])   . Degenerative disc disease, lumbar 04/2013  . Dyspnea   . Family history of adverse reaction to anesthesia    son arrested after anesthesia  . GERD (gastroesophageal reflux disease)   . Hyperlipidemia   . Iron deficiency anemia 05/27/2017  . Neuropathy   . Osteoarthritis    of knee  . Osteoporosis   . Tobacco abuse   . Vitamin D deficiency    Surgical Hx:  Past Surgical History:  Procedure Laterality Date  . APPENDECTOMY  1965  . BREAST EXCISIONAL BIOPSY Left 08/01/2003   lumpectomy rad 11/04-2/28/2005  . CESAREAN SECTION     x3  . COLON SURGERY    . COLONOSCOPY W/ POLYPECTOMY    . ESOPHAGOGASTRODUODENOSCOPY (EGD) WITH PROPOFOL N/A 04/04/2017   Procedure: ESOPHAGOGASTRODUODENOSCOPY (EGD) WITH PROPOFOL;  Surgeon: [name], MD;  Location: [facility];  Service: Endoscopy;  Laterality: N/A;  . HIP FRACTURE SURGERY Left 01/25/2012  . LAPAROTOMY N/A 04/15/2017   Procedure: EXPLORATORY LAPAROTOMY;  Surgeon: Clayburn Pert, MD;  Location: [facility];  Service: General;  Laterality: N/A;  . NECK SURGERY  12/2011  .  PARASTOMAL HERNIA REPAIR N/A 12/03/2017   Procedure: HERNIA REPAIR PARASTOMAL;  Surgeon: Jules Husbands, MD;  Location: [facility];  Service: General;  Laterality: N/A;  . PORTACATH PLACEMENT Right 05/21/2017   Procedure: INSERTION PORT-A-CATH;  Surgeon: Clayburn Pert, MD;  Location: [facility];  Service: General;  Laterality: Right;  . WRIST FRACTURE SURGERY     Family Hx:  Family History  Problem Relation Age of Onset  . Diabetes Mother        type 2  . Coronary artery disease Mother   . Deep vein thrombosis Mother   . Colon cancer Father   . Asthma Brother   . Diabetes Brother         type 2   Social Hx:   Social History   Tobacco Use  . Smoking status: Former Smoker    Packs/day: 0.25    Years: 55.00    Pack years: 13.75    Types: Cigarettes    Last attempt to quit: 05/21/2017    Years since quitting: 0.7  . Smokeless tobacco: Never Used  . Tobacco comment: started age 29 1/2 to 1 ppd  Substance Use Topics  . Alcohol use: Yes    Alcohol/week: 0.0 oz    Comment: occasionally drinks beer  . Drug use: No   Medication:    Current Outpatient Medications:  .  budesonide-formoterol (SYMBICORT) 80-4.5 MCG/ACT inhaler, Inhale 2 puffs into the lungs 2 (two) times daily., Disp: , Rfl:  .  Calcium Citrate-Vitamin D (CITRACAL/VITAMIN D PO), Take 1 tablet by mouth daily. , Disp: , Rfl:  .  Cholecalciferol (VITAMIN D PO), Take by mouth., Disp: , Rfl:  .  Cyanocobalamin (VITAMIN B 12 PO), Take 1 tablet by mouth daily. , Disp: , Rfl:  .  feeding supplement (BOOST / RESOURCE BREEZE) LIQD, Take 1 Container by mouth 3 (three) times daily between meals., Disp: 30 Container, Rfl: 0 .  gabapentin (NEURONTIN) 400 MG capsule, TAKE 1 CAPSULE TWICE DAILY, Disp: 180 capsule, Rfl: 4 .  HYDROcodone-acetaminophen (NORCO) 7.5-325 MG tablet, Take 1-2 tablets by mouth every 6 (six) hours as needed for moderate pain., Disp: 120 tablet, Rfl: 0 .  ibuprofen (ADVIL,MOTRIN) 200 MG tablet, Take 400 mg by mouth daily as needed for headache or moderate pain., Disp: , Rfl:  .  lidocaine-prilocaine (EMLA) cream, Apply to affected area once, Disp: 30 g, Rfl: 3 .  LORazepam (ATIVAN) 0.5 MG tablet, Take 1 tablet (0.5 mg total) by mouth every 6 (six) hours as needed (Nausea or vomiting)., Disp: 30 tablet, Rfl: 0 .  meloxicam (MOBIC) 15 MG tablet, TAKE 1 TABLET EVERY DAY AS NEEDED, Disp: 90 tablet, Rfl: 4 .  omeprazole (PRILOSEC) 20 MG capsule, TAKE 1 CAPSULE EVERY DAY, Disp: 90 capsule, Rfl: 4 .  pravastatin (PRAVACHOL) 40 MG tablet, TAKE 1 TABLET (40 MG TOTAL) BY MOUTH DAILY., Disp: 90 tablet, Rfl: 4 .   Probiotic Product (PROBIOTIC DAILY PO), Take 1 capsule by mouth daily. , Disp: , Rfl:  .  pyridOXINE (VITAMIN B-6) 50 MG tablet, Take 1 tablet (50 mg total) by mouth daily., Disp: 30 tablet, Rfl: 3 .  Triamcinolone Acetonide (NASACORT ALLERGY 24HR NA), Place 1 spray into the nose daily as needed (allergies)., Disp: , Rfl:  No current facility-administered medications for this visit.   Facility-Administered Medications Ordered in Other Visits:  .  0.9 %  sodium chloride infusion, , Intravenous, Continuous, Earlie Server, MD, Stopped at 07/22/17 1150 .  heparin lock flush 100  unit/mL, 500 Units, Intravenous, Once, Earlie Server, MD .  heparin lock flush 100 unit/mL, 500 Units, Intravenous, Once, Earlie Server, MD .  heparin lock flush 100 unit/mL, 500 Units, Intravenous, Once, Earlie Server, MD .  sodium chloride flush (NS) 0.9 % injection 10 mL, 10 mL, Intracatheter, PRN, Earlie Server, MD .  sodium chloride flush (NS) 0.9 % injection 10 mL, 10 mL, Intravenous, PRN, Earlie Server, MD, 10 mL at 05/29/17 1347   Allergies:  Betadine [povidone iodine]; Other; and Sinus formula [cholestatin]  Review of Systems: Gen:  Denies  fever, sweats, chills HEENT: Denies blurred vision, double vision. bleeds, sore throat Cvc:  No dizziness, chest pain. Resp:   Denies cough or sputum production, shortness of breath Gi: Denies swallowing difficulty, stomach pain. Gu:  Denies bladder incontinence, burning urine Ext:   No Joint pain, stiffness. Skin: No skin rash,  hives  Endoc:  No polyuria, polydipsia. Psych: No depression, insomnia. Other:  All other systems were reviewed with the patient and were negative other that what is mentioned in the HPI.   Physical Examination:   VS: BP 106/72 (BP Location: Left Arm, Cuff Size: Normal)   Pulse 93   Ht 5\' 5"  (1.651 m)   Wt 157 lb (71.2 kg)   SpO2 90%   BMI 26.13 kg/m   General Appearance: No distress  Neuro:without focal findings,  speech normal,  HEENT: PERRLA, EOM intact.     Pulmonary: Scattered rhonchi. CardiovascularNormal S1,S2.  No m/r/g.   Abdomen: Benign, Soft, non-tender. Renal:  No costovertebral tenderness  GU:  No performed at this time. Endoc: No evident thyromegaly, no signs of acromegaly. Skin:   warm, no rashes, no ecchymosis  Extremities: normal, no cyanosis, clubbing.  Other findings:    LABORATORY PANEL:   CBC No results for input(s): WBC, HGB, HCT, PLT in the last 168 hours. ------------------------------------------------------------------------------------------------------------------  Chemistries  No results for input(s): NA, K, CL, CO2, GLUCOSE, BUN, CREATININE, CALCIUM, MG, AST, ALT, ALKPHOS, BILITOT in the last 168 hours.  Invalid input(s): GFRCGP ------------------------------------------------------------------------------------------------------------------  Cardiac Enzymes No results for input(s): TROPONINI in the last 168 hours. ------------------------------------------------------------  RADIOLOGY:  No results found.     Thank  you for the consultation and for allowing [facility] to assist in the care of your patient. Our recommendations are noted above.  Please contact us if we can be of further service.   Marda Stalker, MD.  Board Certified in Internal Medicine, Pulmonary Medicine, [place], and Sleep Medicine.  [facility] Office Number: [phone]  Patricia Pesa, M.D.  Merton Border, M.D  02/25/2018

## 2018-02-25 NOTE — Patient Instructions (Addendum)
Increase symbicort to 2 puffs twice daily.  Start singulair one tablet every night.  Use nasacort nasal spray 2 sprays in each nostril once every night.

## 2018-02-27 ENCOUNTER — Telehealth: Payer: Self-pay | Admitting: Internal Medicine

## 2018-02-27 MED ORDER — MONTELUKAST SODIUM 10 MG PO TABS: 10.0000 mg | ORAL_TABLET | Freq: Every day | ORAL | 3 refills | Status: DC

## 2018-02-27 NOTE — Telephone Encounter (Signed)
Pt calling stating she needs refill   *STAT* If patient is at the pharmacy, call can be transferred to refill team.   1. Which medications need to be refilled? (please list name of each medication and dose if known)  She states she's not sure but we prescribed it. She think it may be allergy medication  2. Which pharmacy/location (including street and city if local pharmacy) is medication to be sent to? [facility] in [place]   3. Do they need a 30 day or 90 day supply? 90 day

## 2018-02-27 NOTE — Telephone Encounter (Signed)
LMOVM for pt to return call 

## 2018-02-27 NOTE — Telephone Encounter (Signed)
Spoke with pt and she states it is the Singulair. Sent in RX since DR didn't at pt's appt. Nothing further needed.

## 2018-03-04 ENCOUNTER — Ambulatory Visit (INDEPENDENT_AMBULATORY_CARE_PROVIDER_SITE_OTHER): Payer: Medicare HMO | Admitting: Surgery

## 2018-03-04 ENCOUNTER — Other Ambulatory Visit: Payer: Self-pay

## 2018-03-04 ENCOUNTER — Encounter: Payer: Self-pay | Admitting: Surgery

## 2018-03-04 DIAGNOSIS — C189 Malignant neoplasm of colon, unspecified: Secondary | ICD-10-CM

## 2018-03-04 MED ORDER — SULFAMETHOXAZOLE-TRIMETHOPRIM 800-160 MG PO TABS: 1.0000 | ORAL_TABLET | Freq: Two times a day (BID) | ORAL | 0 refills | Status: DC

## 2018-03-04 NOTE — Patient Instructions (Addendum)
We will send the referral to Dr. Georgeann Oppenheim office for a consult so he can schedule the Colonoscopy. Someone from their office will call to schedule an appointment within 5-7 days. Please call our office if you do not receive a call.   The  barium enema test is scheduled for 03/11/18 at the [facility].  Please arrive by 8:45 am.    Please pick up your medication at your pharmacy and begin taking it today.   Please call our office August 2019 to schedule follow up appointment for September 2019 review all test results and to plan your surgery date.

## 2018-03-04 NOTE — Progress Notes (Signed)
S/p (12/03/17) Right colectomy and repair of parastomal hernia Did have a history of an obstructing transverse colon cancer for which she had a transverse colectomy by Dr. Adonis Huguenin  He is doing very well and completed radiation therapy. Complaints some soreness around the port site. She still feels weak  PE NAD Abd: soft, ileostomy pink and patent and mucous fistula patent.  No evidence of infection.  A/P doing well. We will need a barium enema to assess her left colon.  She will also need endoscopic evaluation. She is to have CT scan for surveillance on August 21 and I will see her shortly after and depending on those results and the endoscopic evaluation we can likely schedule her for a colostomy takedown.  Discussed with her and the family in detail about my thought process. They understand

## 2018-03-05 ENCOUNTER — Encounter: Payer: Self-pay | Admitting: Surgery

## 2018-03-09 ENCOUNTER — Other Ambulatory Visit: Payer: Self-pay

## 2018-03-09 DIAGNOSIS — Z85038 Personal history of other malignant neoplasm of large intestine: Secondary | ICD-10-CM

## 2018-03-09 MED ORDER — NA SULFATE-K SULFATE-MG SULF 17.5-3.13-1.6 GM/177ML PO SOLN: 1.0000 | Freq: Once | ORAL | 0 refills | Status: AC

## 2018-03-11 ENCOUNTER — Other Ambulatory Visit: Payer: Self-pay

## 2018-03-11 ENCOUNTER — Ambulatory Visit
Admission: RE | Admit: 2018-03-11 | Discharge: 2018-03-11 | Disposition: A | Discharge status: Home / Self Care | Payer: Medicare HMO | Source: Ambulatory Visit | Attending: Surgery | Admitting: Surgery

## 2018-03-11 DIAGNOSIS — Z933 Colostomy status: Secondary | ICD-10-CM | POA: Diagnosis not present

## 2018-03-11 DIAGNOSIS — Z01818 Encounter for other preprocedural examination: Secondary | ICD-10-CM | POA: Diagnosis not present

## 2018-03-11 DIAGNOSIS — C189 Malignant neoplasm of colon, unspecified: Secondary | ICD-10-CM | POA: Diagnosis not present

## 2018-03-16 ENCOUNTER — Telehealth: Payer: Self-pay

## 2018-03-16 NOTE — Telephone Encounter (Signed)
error 

## 2018-03-18 ENCOUNTER — Encounter: Payer: Self-pay | Admitting: Surgery

## 2018-03-18 ENCOUNTER — Ambulatory Visit (INDEPENDENT_AMBULATORY_CARE_PROVIDER_SITE_OTHER): Payer: Medicare HMO | Admitting: Surgery

## 2018-03-18 ENCOUNTER — Telehealth: Payer: Self-pay

## 2018-03-18 ENCOUNTER — Ambulatory Visit
Admission: RE | Admit: 2018-03-18 | Discharge: 2018-03-18 | Disposition: A | Discharge status: Home / Self Care | Payer: Medicare HMO | Source: Ambulatory Visit | Attending: Surgery | Admitting: Surgery

## 2018-03-18 VITALS — BP 122/72 | HR 80 | Temp 98.2°F | Ht 65.0 in | Wt 158.2 lb

## 2018-03-18 DIAGNOSIS — E279 Disorder of adrenal gland, unspecified: Secondary | ICD-10-CM | POA: Insufficient documentation

## 2018-03-18 DIAGNOSIS — R109 Unspecified abdominal pain: Secondary | ICD-10-CM | POA: Diagnosis not present

## 2018-03-18 DIAGNOSIS — R1084 Generalized abdominal pain: Secondary | ICD-10-CM

## 2018-03-18 DIAGNOSIS — L02211 Cutaneous abscess of abdominal wall: Secondary | ICD-10-CM | POA: Diagnosis not present

## 2018-03-18 DIAGNOSIS — Z932 Ileostomy status: Secondary | ICD-10-CM | POA: Insufficient documentation

## 2018-03-18 MED ORDER — AMOXICILLIN-POT CLAVULANATE 875-125 MG PO TABS: 1.0000 | ORAL_TABLET | Freq: Two times a day (BID) | ORAL | 0 refills | Status: DC

## 2018-03-18 MED ORDER — IOPAMIDOL (ISOVUE-370) INJECTION 76%
100.0000 mL | Freq: Once | INTRAVENOUS | Status: AC | PRN
  Administered 2018-03-18: 100 mL via INTRAVENOUS

## 2018-03-18 NOTE — Telephone Encounter (Signed)
Patient called stating she has a thick whitish drainage coming from her incision. When she applies pressure to the area there is a lot of liquid coming from the area. Patient added to schedule today.

## 2018-03-18 NOTE — Progress Notes (Signed)
03/18/2018  History of Present Illness: Kimberly [name] is a 72 y.o. female s/p prior transverse colectomy with end colostomy and mucous fistula creation on 05/24/04, which was complicated with a parastomal hernia and prolapse, and had to undergo a right hemicolectomy with end ileostomy and repair of parastomal hernia with BioA mesh on 12/03/17.  She had been doing well, until today.  She was outside doing yard work and noticed that she had some discomfort at her prior colostomy site.  When she went back inside her house, she noticed her shirt was wet and had purulent drainage out of a punctate wound at the medial end of the colostomy scar.  She denies having any fevers or chills at home, and was at her baseline and doing well yesterday.  She does note that the scar area is somewhat red today.  After the purulent fluid drained, she noted that the soreness was improved.  She has not had any issues with her end ileostomy or the mucous fistula.  She has a good appetite and ostomy function.  Past Medical History: Past Medical History:  Diagnosis Date  . Breast cancer, left (HCC)    Lumpectomy and rad tx's.  . Chronic back pain   . Colon cancer ([place])   . Degenerative disc disease, lumbar 04/2013  . Dyspnea   . Family history of adverse reaction to anesthesia    son arrested after anesthesia  . GERD (gastroesophageal reflux disease)   . Hyperlipidemia   . Iron deficiency anemia 05/27/2017  . Neuropathy   . Osteoarthritis    of knee  . Osteoporosis   . Tobacco abuse   . Vitamin D deficiency      Past Surgical History: Past Surgical History:  Procedure Laterality Date  . APPENDECTOMY  1965  . BREAST EXCISIONAL BIOPSY Left 08/01/2003   lumpectomy rad 11/04-2/28/2005  . CESAREAN SECTION     x3  . COLON SURGERY    . COLONOSCOPY W/ POLYPECTOMY    . ESOPHAGOGASTRODUODENOSCOPY (EGD) WITH PROPOFOL N/A 04/04/2017   Procedure: ESOPHAGOGASTRODUODENOSCOPY (EGD) WITH PROPOFOL;  Surgeon: [name], MD;   Location: [facility];  Service: Endoscopy;  Laterality: N/A;  . HIP FRACTURE SURGERY Left 01/25/2012  . LAPAROTOMY N/A 04/15/2017   Procedure: EXPLORATORY LAPAROTOMY;  Surgeon: Clayburn Pert, MD;  Location: [facility];  Service: General;  Laterality: N/A;  . NECK SURGERY  12/2011  . PARASTOMAL HERNIA REPAIR N/A 12/03/2017   Procedure: HERNIA REPAIR PARASTOMAL;  Surgeon: Jules Husbands, MD;  Location: [facility];  Service: General;  Laterality: N/A;  . PORTACATH PLACEMENT Right 05/21/2017   Procedure: INSERTION PORT-A-CATH;  Surgeon: Clayburn Pert, MD;  Location: [facility];  Service: General;  Laterality: Right;  . WRIST FRACTURE SURGERY      Home Medications: Prior to Admission medications   Medication Sig Start Date End Date Taking? Authorizing Provider  budesonide-formoterol (SYMBICORT) 80-4.5 MCG/ACT inhaler Inhale 2 puffs into the lungs 2 (two) times daily.   Yes [provider]  Calcium Citrate-Vitamin D (CITRACAL/VITAMIN D PO) Take 1 tablet by mouth daily.    Yes [provider]  Cholecalciferol (VITAMIN D PO) Take by mouth.   Yes [provider]  Cyanocobalamin (VITAMIN B 12 PO) Take 1 tablet by mouth daily.    Yes [provider]  feeding supplement (BOOST / RESOURCE BREEZE) LIQD Take 1 Container by mouth 3 (three) times daily between meals. 04/19/17  Yes Fritzi Mandes, MD  gabapentin (NEURONTIN) 400 MG capsule TAKE  1 CAPSULE TWICE DAILY 09/28/17  Yes Birdie Sons, MD  HYDROcodone-acetaminophen (NORCO) 7.5-325 MG tablet Take 1-2 tablets by mouth every 6 (six) hours as needed for moderate pain. 02/18/18  Yes Birdie Sons, MD  ibuprofen (ADVIL,MOTRIN) 200 MG tablet Take 400 mg by mouth daily as needed for headache or moderate pain.   Yes [provider]  lidocaine-prilocaine (EMLA) cream Apply to affected area once 05/22/17  Yes Earlie Server, MD  LORazepam (ATIVAN) 0.5 MG tablet Take 1 tablet (0.5 mg total) by mouth every 6 (six) hours  as needed (Nausea or vomiting). 05/27/17  Yes Earlie Server, MD  meloxicam (MOBIC) 15 MG tablet TAKE 1 TABLET EVERY DAY AS NEEDED 08/07/17  Yes Birdie Sons, MD  montelukast (SINGULAIR) 10 MG tablet Take 1 tablet (10 mg total) by mouth daily. 02/27/18 02/27/19 Yes Laverle Hobby, MD  omeprazole (PRILOSEC) 20 MG capsule TAKE 1 CAPSULE EVERY DAY 02/10/18  Yes Birdie Sons, MD  pravastatin (PRAVACHOL) 40 MG tablet TAKE 1 TABLET (40 MG TOTAL) BY MOUTH DAILY. 02/17/17  Yes Birdie Sons, MD  Probiotic Product (PROBIOTIC DAILY PO) Take 1 capsule by mouth daily.    Yes [provider]  pyridOXINE (VITAMIN B-6) 50 MG tablet Take 1 tablet (50 mg total) by mouth daily. 06/24/17  Yes Earlie Server, MD  sulfamethoxazole-trimethoprim (BACTRIM DS,SEPTRA DS) 800-160 MG tablet Take 1 tablet by mouth 2 (two) times daily. 03/04/18  Yes Pabon, Diego F, MD  Triamcinolone Acetonide (NASACORT ALLERGY 24HR NA) Place 1 spray into the nose daily as needed (allergies).   Yes [provider]  amoxicillin-clavulanate (AUGMENTIN) 875-125 MG tablet Take 1 tablet by mouth 2 (two) times daily. 03/18/18   Olean Ree, MD    Allergies: Allergies  Allergen Reactions  . Betadine [Povidone Iodine] Other (See Comments)    Topical iodine she states "if you put it in an open sore it will cause a eating ulcer."  . Other Nausea And Vomiting    Antihystamines  . Sinus Formula [Cholestatin] Nausea Only    Review of Systems: Review of Systems  Constitutional: Negative for chills and fever.  Respiratory: Negative for shortness of breath.   Cardiovascular: Negative for chest pain.  Gastrointestinal: Negative for abdominal pain, constipation, diarrhea, heartburn, nausea and vomiting.    Physical Exam BP 122/72   Pulse 80   Temp 98.2 F (36.8 C) (Oral)   Ht 5\' 5"  (1.651 m)   Wt 158 lb 3.2 oz (71.8 kg)   BMI 26.33 kg/m  CONSTITUTIONAL: No acute distress HEENT:  Normocephalic, atraumatic, extraocular motion  intact. RESPIRATORY:  Lungs are clear, and breath sounds are equal bilaterally. Normal respiratory effort without pathologic use of accessory muscles. CARDIOVASCULAR: Heart is regular without murmurs, gallops, or rubs. GI: The abdomen is soft, nondistended, nontender to palpation.  There is only some discomfort when pressing on the prior colostomy site.  There is a 2 mm wound at the medial end of the colostomy scar.  Upon manual compression of the scar, there is a significant amount of purulent fluid expressed, but no succus.  Q-tip probing of the wound shows what looks like a fascial defect. Probing expanded the wound to 4 mm size.  No further purulent fluid noted after.  Her end ileostomy was patent with stool in bag.  Mucous fistula pink and viable. NEUROLOGIC:  Motor and sensation is grossly normal.  Cranial nerves are grossly intact. PSYCH:  Alert and oriented to person, place and time.  Affect is normal.  Labs/Imaging: None  Assessment and Plan: This is a 72 y.o. female who presents with new purulent drainage from her prior colostomy site.  She is 3 months out of her surgery.   Discussed with the patient that she has an abscess, but it is unclear if this is superficial or not.  From probing with q-tip, my concern is that it is subfascial.  No succus was noted so less likelihood of a fistula, but at this point this is unclear.  Discussed with patient that we would get a STAT CT scan of abdomen and pelvis with contrast to further evaluate the source of this purulent fluid.  Will start her on Augmentin as well.  She will follow up with [place] tomorrow to see how she's doing and go over the CT scan findings.  She is aware that depending on the CT results, she may need to be hospitalized.  Of note, patient was planning on having a colonoscopy tomorrow as part of her surgical planning for ostomy takedown, but at this point, this will be postponed.  Dr. Bonna Gains will be made aware of this.  Face-to-face  time spent with the patient and care providers was 25 minutes, with more than 50% of the time spent counseling, educating, and coordinating care of the patient.     Melvyn Neth, [place]

## 2018-03-18 NOTE — Patient Instructions (Addendum)
We have rescheduled your Colonoscopy on 04/09/18.  Please keep your scheduled appointment with Dr.Pabon.  Please pick up your medication at the pharmacy. Please stop the Bactrim today.   Please do not eat /drink anything today until after your CT scan.   Please see your follow up appointment listed below.

## 2018-03-19 ENCOUNTER — Ambulatory Visit (INDEPENDENT_AMBULATORY_CARE_PROVIDER_SITE_OTHER): Payer: Medicare HMO | Admitting: Surgery

## 2018-03-19 ENCOUNTER — Encounter: Payer: Self-pay | Admitting: Surgery

## 2018-03-19 ENCOUNTER — Ambulatory Visit: Payer: Medicare HMO | Admitting: Radiation Oncology

## 2018-03-19 VITALS — BP 132/74 | HR 97 | Temp 98.1°F | Ht 65.0 in

## 2018-03-19 DIAGNOSIS — L02211 Cutaneous abscess of abdominal wall: Secondary | ICD-10-CM | POA: Diagnosis not present

## 2018-03-19 NOTE — Patient Instructions (Signed)
Please remove the packing from the wound. Shower as usual. Place new packing into wound and place a dry dressing over the area.   Please continue to take the Antibiotic until finished.   Please see your follow up appointment listed below.

## 2018-03-19 NOTE — Progress Notes (Signed)
03/19/2018  History of Present Illness: Kimberly [name] is a 72 y.o. female who presents to discuss results of CT scan and I&D of abscess.  CT scan was independently viewed by me and results were discussed with the patient yesterday.  Overall, the patient has an abscess of the anterior abdominal wall at the site of her prior colostomy.  There was no contrast extravasation or communication with bowel and no intra-abdominal component.  The patient started her Augmentin this morning.  She did have nausea last night but no emesis, and denies any fevers.    Given the size of the abscess, and only a small punctate wound for drainage, she is in agreement to do an I&D of the site.  Risks and benefits were explained to the patient and she's willing to proceed.  Past Medical History: Past Medical History:  Diagnosis Date  . Breast cancer, left (HCC)    Lumpectomy and rad tx's.  . Chronic back pain   . Colon cancer ([place])   . Degenerative disc disease, lumbar 04/2013  . Dyspnea   . Family history of adverse reaction to anesthesia    son arrested after anesthesia  . GERD (gastroesophageal reflux disease)   . Hyperlipidemia   . Iron deficiency anemia 05/27/2017  . Neuropathy   . Osteoarthritis    of knee  . Osteoporosis   . Tobacco abuse   . Vitamin D deficiency      Past Surgical History: Past Surgical History:  Procedure Laterality Date  . APPENDECTOMY  1965  . BREAST EXCISIONAL BIOPSY Left 08/01/2003   lumpectomy rad 11/04-2/28/2005  . CESAREAN SECTION     x3  . COLON SURGERY    . COLONOSCOPY W/ POLYPECTOMY    . ESOPHAGOGASTRODUODENOSCOPY (EGD) WITH PROPOFOL N/A 04/04/2017   Procedure: ESOPHAGOGASTRODUODENOSCOPY (EGD) WITH PROPOFOL;  Surgeon: [name], MD;  Location: [facility];  Service: Endoscopy;  Laterality: N/A;  . HIP FRACTURE SURGERY Left 01/25/2012  . LAPAROTOMY N/A 04/15/2017   Procedure: EXPLORATORY LAPAROTOMY;  Surgeon: Clayburn Pert, MD;  Location: [facility];   Service: General;  Laterality: N/A;  . NECK SURGERY  12/2011  . PARASTOMAL HERNIA REPAIR N/A 12/03/2017   Procedure: HERNIA REPAIR PARASTOMAL;  Surgeon: Jules Husbands, MD;  Location: [facility];  Service: General;  Laterality: N/A;  . PORTACATH PLACEMENT Right 05/21/2017   Procedure: INSERTION PORT-A-CATH;  Surgeon: Clayburn Pert, MD;  Location: [facility];  Service: General;  Laterality: Right;  . WRIST FRACTURE SURGERY      Home Medications: Prior to Admission medications   Medication Sig Start Date End Date Taking? Authorizing Provider  amoxicillin-clavulanate (AUGMENTIN) 875-125 MG tablet Take 1 tablet by mouth 2 (two) times daily. 03/18/18  Yes Soleia Badolato, Jacqulyn Bath, MD  budesonide-formoterol (SYMBICORT) 80-4.5 MCG/ACT inhaler Inhale 2 puffs into the lungs 2 (two) times daily.   Yes [provider]  Calcium Citrate-Vitamin D (CITRACAL/VITAMIN D PO) Take 1 tablet by mouth daily.    Yes [provider]  Cholecalciferol (VITAMIN D PO) Take by mouth.   Yes [provider]  Cyanocobalamin (VITAMIN B 12 PO) Take 1 tablet by mouth daily.    Yes [provider]  feeding supplement (BOOST / RESOURCE BREEZE) LIQD Take 1 Container by mouth 3 (three) times daily between meals. 04/19/17  Yes Fritzi Mandes, MD  gabapentin (NEURONTIN) 400 MG capsule TAKE 1 CAPSULE TWICE DAILY 09/28/17  Yes Birdie Sons, MD  HYDROcodone-acetaminophen (NORCO) 7.5-325 MG tablet Take 1-2 tablets by mouth  every 6 (six) hours as needed for moderate pain. 02/18/18  Yes Birdie Sons, MD  ibuprofen (ADVIL,MOTRIN) 200 MG tablet Take 400 mg by mouth daily as needed for headache or moderate pain.   Yes [provider]  lidocaine-prilocaine (EMLA) cream Apply to affected area once 05/22/17  Yes Earlie Server, MD  LORazepam (ATIVAN) 0.5 MG tablet Take 1 tablet (0.5 mg total) by mouth every 6 (six) hours as needed (Nausea or vomiting). 05/27/17  Yes Earlie Server, MD  meloxicam (MOBIC) 15 MG tablet TAKE 1  TABLET EVERY DAY AS NEEDED 08/07/17  Yes Birdie Sons, MD  montelukast (SINGULAIR) 10 MG tablet Take 1 tablet (10 mg total) by mouth daily. 02/27/18 02/27/19 Yes Laverle Hobby, MD  omeprazole (PRILOSEC) 20 MG capsule TAKE 1 CAPSULE EVERY DAY 02/10/18  Yes Birdie Sons, MD  pravastatin (PRAVACHOL) 40 MG tablet TAKE 1 TABLET (40 MG TOTAL) BY MOUTH DAILY. 02/17/17  Yes Birdie Sons, MD  Probiotic Product (PROBIOTIC DAILY PO) Take 1 capsule by mouth daily.    Yes [provider]  pyridOXINE (VITAMIN B-6) 50 MG tablet Take 1 tablet (50 mg total) by mouth daily. 06/24/17  Yes Earlie Server, MD  Triamcinolone Acetonide (NASACORT ALLERGY 24HR NA) Place 1 spray into the nose daily as needed (allergies).   Yes [provider]    Allergies: Allergies  Allergen Reactions  . Betadine [Povidone Iodine] Other (See Comments)    Topical iodine she states "if you put it in an open sore it will cause a eating ulcer."  . Other Nausea And Vomiting    Antihystamines  . Sinus Formula [Cholestatin] Nausea Only   Review of Systems: Review of Systems  Constitutional: Negative for chills and fever.  Respiratory: Negative for shortness of breath.   Cardiovascular: Negative for chest pain.  Gastrointestinal: Positive for nausea. Negative for abdominal pain.    Physical Exam BP 132/74   Pulse 97   Temp 98.1 F (36.7 C) (Oral)   Ht 5\' 5"  (1.651 m)   BMI 26.33 kg/m  CONSTITUTIONAL: No acute distress GI: The abdomen is soft, nondistended, nontender to palpation.  There is less erythema around the prior colostomy site.  On manual pressure, there is seropurulent fluid only.   NEUROLOGIC:  Motor and sensation is grossly normal.  Cranial nerves are grossly intact. PSYCH:  Alert and oriented to person, place and time. Affect is normal.  Labs/Imaging: CT scan 6/19: IMPRESSION: RIGHT mid abdominal wall abscess collection identified superior to the ileostomy in the upper RIGHT pelvis with  gas/fluid collection measuring maximum dimensions of 10.1 x 4.0 x 2.7 cm, demonstrating extension between anterior abdominal wall muscle planes.  Dense retained contrast material within the distal colon to rectum.  Chronic BILATERAL adrenal masses  Assessment and Plan: This is a 72 y.o. female with an anterior abdominal wall abscess.  I have independently reviewed the patient's imaging study and reviewed it with the patient.  Upon further discussion, the patient was in agreement to proceed with an incision and drainage procedure of the abscess.  1% lidocaine was infiltrated at the site of drainage and laterally.  Then with a scalpel, the wound was extended laterally to about 3 cm in length.  Q-tip was used to probe the cavity and get rid of loculations.  More seropurulent fluid was expressed this way.  Then the cavity was irrigated thoroughly with normal saline.  The wound was then packed with half-inch iodoform gauze and dressed with dry  gauze and tape.  The patient tolerated procedure well and there were no complications.  All sharps were appropriately disposed off.  The patient will continue her Augmentin twice daily and will follow up with [place] next week for a wound check.  She will do packing change with half-inch iodoform gauze once daily.  We were able to teach patient how to do this and she showed good understanding.  Face-to-face time spent with the patient and care providers was 25 minutes, with more than 50% of the time spent counseling, educating, and coordinating care of the patient.     Melvyn Neth, [place]

## 2018-03-26 ENCOUNTER — Ambulatory Visit (INDEPENDENT_AMBULATORY_CARE_PROVIDER_SITE_OTHER): Payer: Medicare HMO | Admitting: Surgery

## 2018-03-26 ENCOUNTER — Encounter: Payer: Self-pay | Admitting: Surgery

## 2018-03-26 ENCOUNTER — Other Ambulatory Visit: Payer: Self-pay | Admitting: Family Medicine

## 2018-03-26 VITALS — BP 124/77 | HR 88 | Temp 97.8°F | Wt 158.0 lb

## 2018-03-26 DIAGNOSIS — M5136 Other intervertebral disc degeneration, lumbar region: Secondary | ICD-10-CM

## 2018-03-26 DIAGNOSIS — Z09 Encounter for follow-up examination after completed treatment for conditions other than malignant neoplasm: Secondary | ICD-10-CM

## 2018-03-26 MED ORDER — AMOXICILLIN-POT CLAVULANATE 875-125 MG PO TABS: 1.0000 | ORAL_TABLET | Freq: Two times a day (BID) | ORAL | 0 refills | Status: DC

## 2018-03-26 MED ORDER — HYDROCODONE-ACETAMINOPHEN 7.5-325 MG PO TABS: 1.0000 | ORAL_TABLET | Freq: Four times a day (QID) | ORAL | 0 refills | Status: DC | PRN

## 2018-03-26 NOTE — Patient Instructions (Addendum)
We will see you back in 2 weeks.  Please go to the pharmacy and pick up your prescription.

## 2018-03-27 ENCOUNTER — Encounter: Payer: Self-pay | Admitting: Surgery

## 2018-03-27 NOTE — Progress Notes (Signed)
S/p Right colectomy  End ileostomy and parastomal hernia repair 12/03/2017 Developed Abd wall abscess that was I/D by Dr. Hampton Abbot last week She is been doing well No fevers, taking PO, ostomy working   PE NAD Abd: soft, Small open wound right abd wall, packing changed. No evidence of necrotizing infection, no peritonitis  A/p Continue BID packing Antibiotic therapy for 10 more days RTC 2 weeks

## 2018-03-31 ENCOUNTER — Ambulatory Visit
Admission: RE | Admit: 2018-03-31 | Discharge: 2018-03-31 | Disposition: A | Discharge status: Home / Self Care | Payer: Medicare HMO | Source: Ambulatory Visit | Attending: Radiation Oncology | Admitting: Radiation Oncology

## 2018-03-31 ENCOUNTER — Encounter: Payer: Self-pay | Admitting: Radiation Oncology

## 2018-03-31 ENCOUNTER — Other Ambulatory Visit: Payer: Self-pay

## 2018-03-31 VITALS — BP 121/78 | HR 73 | Temp 97.6°F | Resp 20 | Wt 158.5 lb

## 2018-03-31 DIAGNOSIS — L02211 Cutaneous abscess of abdominal wall: Secondary | ICD-10-CM | POA: Diagnosis not present

## 2018-03-31 DIAGNOSIS — C184 Malignant neoplasm of transverse colon: Secondary | ICD-10-CM | POA: Diagnosis not present

## 2018-03-31 DIAGNOSIS — Z9049 Acquired absence of other specified parts of digestive tract: Secondary | ICD-10-CM | POA: Insufficient documentation

## 2018-03-31 DIAGNOSIS — Z08 Encounter for follow-up examination after completed treatment for malignant neoplasm: Secondary | ICD-10-CM | POA: Insufficient documentation

## 2018-03-31 DIAGNOSIS — Z923 Personal history of irradiation: Secondary | ICD-10-CM | POA: Insufficient documentation

## 2018-03-31 NOTE — Progress Notes (Signed)
Radiation Oncology Follow up Note  Name: Kimberly [name]   Date:   03/31/2018 MRN:  [id number] DOB: 11-26-1945    This 72 y.o. 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  REFERRING PROVIDER: Birdie Sons, MD  HPI: patient is a 72 year old female now seen out 1 month having completed adjuvant radiation therapystatus post partial colectomy for a T4 N1 M0 moderately differentiated adenocarcinoma the transverse colon. Seen today in routine follow up she is doing fairly well she's developed a abscess in the right upper quadrant of her abdomen by CT criteria which is draining. She states her bowel function is good she's having no abdominal pain or discomfort..  COMPLICATIONS OF TREATMENT: none  FOLLOW UP COMPLIANCE: keeps appointments   PHYSICAL EXAM:  BP 121/78   Pulse 73   Temp 97.6 F (36.4 C)   Resp 20   Wt 158 lb 8.2 oz (71.9 kg)   BMI 26.38 kg/m  Patient does have a bandage draining some purulent material in the right upper quadrant. Positive bowel sounds in all 4 quadrants.Well-developed well-nourished patient in NAD. HEENT reveals PERLA, EOMI, discs not visualized.  Oral cavity is clear. No oral mucosal lesions are identified. Neck is clear without evidence of cervical or supraclavicular adenopathy. Lungs are clear to A&P. Cardiac examination is essentially unremarkable with regular rate and rhythm without murmur rub or thrill. Abdomen is benign with no organomegaly or masses noted. Motor sensory and DTR levels are equal and symmetric in the upper and lower extremities. Cranial nerves II through XII are grossly intact. Proprioception is intact. No peripheral adenopathy or edema is identified. No motor or sensory levels are noted. Crude visual fields are within normal range.  RADIOLOGY RESULTS: CT scan is reviewed and compatible above-stated  findings  PLAN: present time patient is doing well abscess is slowly draining. I am otherwise please were overall progress. I've asked to see her back in 3-4 months for follow-up. She continues close follow-up care with medical oncology.  I would like to take this opportunity to thank you for allowing me to participate in the care of your patient.Noreene Filbert, MD

## 2018-04-01 ENCOUNTER — Inpatient Hospital Stay: Payer: Medicare HMO | Attending: Oncology

## 2018-04-01 DIAGNOSIS — C184 Malignant neoplasm of transverse colon: Secondary | ICD-10-CM | POA: Insufficient documentation

## 2018-04-01 DIAGNOSIS — Z452 Encounter for adjustment and management of vascular access device: Secondary | ICD-10-CM | POA: Diagnosis not present

## 2018-04-01 MED ORDER — HEPARIN SOD (PORK) LOCK FLUSH 100 UNIT/ML IV SOLN
500.0000 [IU] | Freq: Once | INTRAVENOUS | Status: AC
  Administered 2018-04-01: 500 [IU] via INTRAVENOUS

## 2018-04-01 MED ORDER — SODIUM CHLORIDE 0.9% FLUSH
10.0000 mL | Freq: Once | INTRAVENOUS | Status: AC
  Administered 2018-04-01: 10 mL via INTRAVENOUS
  Filled 2018-04-01: qty 10

## 2018-04-06 ENCOUNTER — Ambulatory Visit: Payer: Self-pay | Admitting: Surgery

## 2018-04-07 ENCOUNTER — Encounter: Payer: Self-pay | Admitting: Surgery

## 2018-04-07 ENCOUNTER — Ambulatory Visit (INDEPENDENT_AMBULATORY_CARE_PROVIDER_SITE_OTHER): Payer: Medicare HMO | Admitting: Surgery

## 2018-04-07 VITALS — BP 138/85 | HR 80 | Temp 97.9°F | Wt 158.8 lb

## 2018-04-07 DIAGNOSIS — L02211 Cutaneous abscess of abdominal wall: Secondary | ICD-10-CM | POA: Diagnosis not present

## 2018-04-07 NOTE — Progress Notes (Signed)
Outpatient Surgical Follow Up  04/07/2018  Kimberly [name] is an 72 y.o. female.   Chief Complaint  Patient presents with  . Follow-up    Abdominal wound check    HPI: Abd wall abscess s/p I/D, doing better, some intermittent pains. No fevers or chills, ostomy working well. Pt doing packing daily. Taking PO.   Past Medical History:  Diagnosis Date  . Breast cancer, left (HCC)    Lumpectomy and rad tx's.  . Chronic back pain   . Colon cancer ([place])   . Degenerative disc disease, lumbar 04/2013  . Dyspnea   . Family history of adverse reaction to anesthesia    son arrested after anesthesia  . GERD (gastroesophageal reflux disease)   . Hyperlipidemia   . Iron deficiency anemia 05/27/2017  . Neuropathy   . Osteoarthritis    of knee  . Osteoporosis   . Tobacco abuse   . Vitamin D deficiency     Past Surgical History:  Procedure Laterality Date  . APPENDECTOMY  1965  . BREAST EXCISIONAL BIOPSY Left 08/01/2003   lumpectomy rad 11/04-2/28/2005  . CESAREAN SECTION     x3  . COLON SURGERY    . COLONOSCOPY W/ POLYPECTOMY    . ESOPHAGOGASTRODUODENOSCOPY (EGD) WITH PROPOFOL N/A 04/04/2017   Procedure: ESOPHAGOGASTRODUODENOSCOPY (EGD) WITH PROPOFOL;  Surgeon: [name], MD;  Location: [facility];  Service: Endoscopy;  Laterality: N/A;  . HIP FRACTURE SURGERY Left 01/25/2012  . LAPAROTOMY N/A 04/15/2017   Procedure: EXPLORATORY LAPAROTOMY;  Surgeon: Clayburn Pert, MD;  Location: [facility];  Service: General;  Laterality: N/A;  . NECK SURGERY  12/2011  . PARASTOMAL HERNIA REPAIR N/A 12/03/2017   Procedure: HERNIA REPAIR PARASTOMAL;  Surgeon: Jules Husbands, MD;  Location: [facility];  Service: General;  Laterality: N/A;  . PORTACATH PLACEMENT Right 05/21/2017   Procedure: INSERTION PORT-A-CATH;  Surgeon: Clayburn Pert, MD;  Location: [facility];  Service: General;  Laterality: Right;  . WRIST FRACTURE SURGERY      Family History  Problem Relation Age of Onset  . Diabetes  Mother        type 2  . Coronary artery disease Mother   . Deep vein thrombosis Mother   . Colon cancer Father   . Asthma Brother   . Diabetes Brother        type 2    Social History:  reports that she quit smoking about 10 months ago. Her smoking use included cigarettes. She has a 13.75 pack-year smoking history. She has never used smokeless tobacco. She reports that she drinks alcohol. She reports that she does not use drugs.  Allergies:  Allergies  Allergen Reactions  . Betadine [Povidone Iodine] Other (See Comments)    Topical iodine she states "if you put it in an open sore it will cause a eating ulcer."  . Other Nausea And Vomiting    Antihystamines  . Sinus Formula [Cholestatin] Nausea Only    Medications reviewed.    ROS Full ROS performed and is otherwise negative other than what is stated in HPI   BP 138/85   Pulse 80   Temp 97.9 F (36.6 C) (Oral)   Wt 72 kg (158 lb 12.8 oz)   BMI 26.43 kg/m   Physical Exam NAD, awake and alert Abd: soft, NT, there is 3 mm wound , small packing removed. No evidence of tracking and near complete resolution of cavity. No peritonitis     Assessment/Plan: Resolving abdominal wall abscess  No need for more packing Pt to finsh A/Bs this week F/U 1 month   [name] Hamman, MD [facility] General Surgeon

## 2018-04-07 NOTE — Patient Instructions (Signed)
Please give [place] a call in case you have any questions or concerns. We will see you back in a month to make sure that your wound is healing.

## 2018-04-09 ENCOUNTER — Ambulatory Visit: Payer: Medicare HMO | Admitting: Anesthesiology

## 2018-04-09 ENCOUNTER — Ambulatory Visit
Admission: RE | Admit: 2018-04-09 | Discharge: 2018-04-09 | Disposition: A | Discharge status: Home / Self Care | Payer: Medicare HMO | Source: Ambulatory Visit | Attending: Gastroenterology | Admitting: Gastroenterology

## 2018-04-09 ENCOUNTER — Encounter
Admission: RE | Disposition: A | Discharge status: Home / Self Care | Payer: Self-pay | Source: Ambulatory Visit | Attending: Gastroenterology

## 2018-04-09 DIAGNOSIS — K219 Gastro-esophageal reflux disease without esophagitis: Secondary | ICD-10-CM | POA: Insufficient documentation

## 2018-04-09 DIAGNOSIS — K6389 Other specified diseases of intestine: Secondary | ICD-10-CM | POA: Diagnosis not present

## 2018-04-09 DIAGNOSIS — E785 Hyperlipidemia, unspecified: Secondary | ICD-10-CM | POA: Diagnosis not present

## 2018-04-09 DIAGNOSIS — Z9049 Acquired absence of other specified parts of digestive tract: Secondary | ICD-10-CM

## 2018-04-09 DIAGNOSIS — Z01818 Encounter for other preprocedural examination: Secondary | ICD-10-CM

## 2018-04-09 DIAGNOSIS — Z87891 Personal history of nicotine dependence: Secondary | ICD-10-CM | POA: Insufficient documentation

## 2018-04-09 DIAGNOSIS — E559 Vitamin D deficiency, unspecified: Secondary | ICD-10-CM | POA: Insufficient documentation

## 2018-04-09 DIAGNOSIS — M1990 Unspecified osteoarthritis, unspecified site: Secondary | ICD-10-CM | POA: Diagnosis not present

## 2018-04-09 DIAGNOSIS — Z85038 Personal history of other malignant neoplasm of large intestine: Secondary | ICD-10-CM | POA: Insufficient documentation

## 2018-04-09 DIAGNOSIS — I2510 Atherosclerotic heart disease of native coronary artery without angina pectoris: Secondary | ICD-10-CM | POA: Diagnosis not present

## 2018-04-09 DIAGNOSIS — D509 Iron deficiency anemia, unspecified: Secondary | ICD-10-CM | POA: Insufficient documentation

## 2018-04-09 DIAGNOSIS — Z933 Colostomy status: Secondary | ICD-10-CM

## 2018-04-09 DIAGNOSIS — Z79899 Other long term (current) drug therapy: Secondary | ICD-10-CM | POA: Insufficient documentation

## 2018-04-09 HISTORY — PX: COLONOSCOPY WITH PROPOFOL: SHX5780

## 2018-04-09 SURGERY — COLONOSCOPY WITH PROPOFOL
Anesthesia: General

## 2018-04-09 MED ORDER — LIDOCAINE HCL (PF) 1 % IJ SOLN
INTRAMUSCULAR | Status: AC
  Administered 2018-04-09: 0.3 mL via INTRADERMAL
  Filled 2018-04-09: qty 2

## 2018-04-09 MED ORDER — PROPOFOL 10 MG/ML IV BOLUS
INTRAVENOUS | Status: DC | PRN
  Administered 2018-04-09: 30 mg via INTRAVENOUS
  Administered 2018-04-09: 10 mg via INTRAVENOUS

## 2018-04-09 MED ORDER — LIDOCAINE HCL (PF) 1 % IJ SOLN
2.0000 mL | Freq: Once | INTRAMUSCULAR | Status: AC
Start: 2018-04-09 — End: 2018-04-09
  Administered 2018-04-09: 0.3 mL via INTRADERMAL

## 2018-04-09 MED ORDER — PROPOFOL 500 MG/50ML IV EMUL
INTRAVENOUS | Status: AC
  Filled 2018-04-09: qty 50

## 2018-04-09 MED ORDER — PROPOFOL 500 MG/50ML IV EMUL
INTRAVENOUS | Status: DC | PRN
  Administered 2018-04-09: 100 ug/kg/min via INTRAVENOUS

## 2018-04-09 MED ORDER — LIDOCAINE HCL (CARDIAC) PF 100 MG/5ML IV SOSY
PREFILLED_SYRINGE | INTRAVENOUS | Status: DC | PRN
  Administered 2018-04-09: 50 mg via INTRAVENOUS

## 2018-04-09 MED ORDER — SODIUM CHLORIDE 0.9 % IV SOLN
INTRAVENOUS | Status: DC
  Administered 2018-04-09: 1000 mL via INTRAVENOUS

## 2018-04-09 NOTE — Anesthesia Postprocedure Evaluation (Signed)
Anesthesia Post Note  Patient: Kimberly [name]  Procedure(s) Performed: COLONOSCOPY WITH PROPOFOL (N/A )  Patient location during evaluation: PACU Anesthesia Type: General Level of consciousness: awake and alert and oriented Pain management: pain level controlled Vital Signs Assessment: post-procedure vital signs reviewed and stable Respiratory status: spontaneous breathing Cardiovascular status: blood pressure returned to baseline Anesthetic complications: no     Last Vitals:  Vitals:   04/09/18 1018 04/09/18 1038  BP: 137/79 (!) 142/78  Pulse: 79 70  Resp: 15   Temp:    SpO2: 95%     Last Pain:  Vitals:   04/09/18 1008  TempSrc: Tympanic                 Kimberly [name]

## 2018-04-09 NOTE — Transfer of Care (Signed)
Immediate Anesthesia Transfer of Care Note  Patient: Kimberly [name]  Procedure(s) Performed: COLONOSCOPY WITH PROPOFOL (N/A )  Patient Location: PACU  Anesthesia Type:General  Level of Consciousness: awake and responds to stimulation  Airway & Oxygen Therapy: Patient Spontanous Breathing and Patient connected to nasal cannula oxygen  Post-op Assessment: Report given to RN and Post -op Vital signs reviewed and stable  Post vital signs: Reviewed and stable  Last Vitals:  Vitals Value Taken Time  BP 120/69 04/09/2018 10:09 AM  Temp    Pulse 84 04/09/2018 10:09 AM  Resp 15 04/09/2018 10:09 AM  SpO2 95 % 04/09/2018 10:09 AM    Last Pain:  Vitals:   04/09/18 0837  TempSrc: Tympanic         Complications: No apparent anesthesia complications

## 2018-04-09 NOTE — Anesthesia Preprocedure Evaluation (Signed)
Anesthesia Evaluation  Patient identified by MRN, date of birth, ID band Patient awake    Reviewed: Allergy & Precautions, NPO status , Patient's Chart, lab work & pertinent test results  History of Anesthesia Complications (+) Family history of anesthesia reactionNegative for: history of anesthetic complications  Airway Mallampati: II  TM Distance: >3 FB Neck ROM: Full    Dental  (+) Poor Dentition   Pulmonary shortness of breath, neg sleep apnea, neg COPD, neg recent URI, former smoker,    breath sounds clear to auscultation- rhonchi (-) wheezing      Cardiovascular Exercise Tolerance: Good (-) hypertension(-) angina+ CAD and + Peripheral Vascular Disease  (-) Past MI, (-) Cardiac Stents and (-) CABG (-) dysrhythmias (-) Valvular Problems/Murmurs Rhythm:Regular Rate:Normal - Systolic murmurs and - Diastolic murmurs    Neuro/Psych negative neurological ROS  negative psych ROS   GI/Hepatic Neg liver ROS, GERD  ,  Endo/Other  negative endocrine ROSneg diabetes  Renal/GU negative Renal ROS     Musculoskeletal  (+) Arthritis ,   Abdominal (+) - obese,   Peds  Hematology negative hematology ROS (+) anemia ,   Anesthesia Other Findings Past Medical History: No date: Breast cancer ([place]) No date: Breast cancer, left (HCC)     Comment:  Lumpectomy and rad tx's. No date: Chronic back pain No date: Chronic knee pain No date: Colon cancer ([place]) 04/2013: Degenerative disc disease, lumbar No date: Dyspnea No date: Family history of adverse reaction to anesthesia     Comment:  son arrested after anesthesia No date: GERD (gastroesophageal reflux disease) No date: Hyperlipidemia No date: Neuropathy No date: Osteoarthritis     Comment:  of knee No date: Osteoporosis No date: Tobacco abuse No date: Vitamin D deficiency   Reproductive/Obstetrics                             Anesthesia  Physical  Anesthesia Plan  ASA: III  Anesthesia Plan: General   Post-op Pain Management:    Induction: Intravenous  PONV Risk Score and Plan: 3 and Ondansetron, Dexamethasone and TIVA  Airway Management Planned: Nasal Cannula  Additional Equipment:   Intra-op Plan:   Post-operative Plan:   Informed Consent: I have reviewed the patients History and Physical, chart, labs and discussed the procedure including the risks, benefits and alternatives for the proposed anesthesia with the patient or authorized representative who has indicated his/her understanding and acceptance.   Dental advisory given  Plan Discussed with: CRNA and Anesthesiologist  Anesthesia Plan Comments:         Anesthesia Quick Evaluation

## 2018-04-09 NOTE — Anesthesia Procedure Notes (Signed)
Performed by: Leasa Kincannon, CRNA Pre-anesthesia Checklist: Patient identified, Emergency Drugs available, Suction available, Patient being monitored and Timeout performed Patient Re-evaluated:Patient Re-evaluated prior to induction Oxygen Delivery Method: Nasal cannula Induction Type: IV induction       

## 2018-04-09 NOTE — H&P (Signed)
Vonda Antigua, MD [address], [place], [place], [place], [address] [address], [place], [place], [place], [address] Phone: [phone]  Fax: [phone]  Primary Care Physician:  Birdie Sons, MD   Pre-Procedure History & Physical: HPI:  Kimberly [name] is a 72 y.o. female is here for a colonoscopy.   Past Medical History:  Diagnosis Date  . Breast cancer, left (HCC)    Lumpectomy and rad tx's.  . Chronic back pain   . Colon cancer ([employer])   . Degenerative disc disease, lumbar 04/2013  . Dyspnea   . Family history of adverse reaction to anesthesia    son arrested after anesthesia  . GERD (gastroesophageal reflux disease)   . Hyperlipidemia   . Iron deficiency anemia 05/27/2017  . Neuropathy   . Osteoarthritis    of knee  . Osteoporosis   . Tobacco abuse   . Vitamin D deficiency     Past Surgical History:  Procedure Laterality Date  . APPENDECTOMY  1965  . BREAST EXCISIONAL BIOPSY Left 08/01/2003   lumpectomy rad 11/04-2/28/2005  . CESAREAN SECTION     x3  . COLON SURGERY    . COLONOSCOPY W/ POLYPECTOMY    . ESOPHAGOGASTRODUODENOSCOPY (EGD) WITH PROPOFOL N/A 04/04/2017   Procedure: ESOPHAGOGASTRODUODENOSCOPY (EGD) WITH PROPOFOL;  Surgeon: [name], MD;  Location: [facility];  Service: Endoscopy;  Laterality: N/A;  . HIP FRACTURE SURGERY Left 01/25/2012  . LAPAROTOMY N/A 04/15/2017   Procedure: EXPLORATORY LAPAROTOMY;  Surgeon: Clayburn Pert, MD;  Location: [facility];  Service: General;  Laterality: N/A;  . NECK SURGERY  12/2011  . PARASTOMAL HERNIA REPAIR N/A 12/03/2017   Procedure: HERNIA REPAIR PARASTOMAL;  Surgeon: Jules Husbands, MD;  Location: [facility];  Service: General;  Laterality: N/A;  . PORTACATH PLACEMENT Right 05/21/2017   Procedure: INSERTION PORT-A-CATH;  Surgeon: Clayburn Pert, MD;  Location: [facility];  Service: General;  Laterality: Right;  . WRIST FRACTURE SURGERY      Prior to Admission medications   Medication Sig  Start Date End Date Taking? Authorizing Provider  amoxicillin-clavulanate (AUGMENTIN) 875-125 MG tablet Take 1 tablet by mouth 2 (two) times daily. 03/18/18   Olean Ree, MD  amoxicillin-clavulanate (AUGMENTIN) 875-125 MG tablet Take 1 tablet by mouth 2 (two) times daily. 03/26/18   Pabon, Diego F, MD  budesonide-formoterol (SYMBICORT) 80-4.5 MCG/ACT inhaler Inhale 2 puffs into the lungs 2 (two) times daily.    [provider]  Calcium Citrate-Vitamin D (CITRACAL/VITAMIN D PO) Take 1 tablet by mouth daily.     [provider]  Cholecalciferol (VITAMIN D PO) Take by mouth.    [provider]  Cyanocobalamin (VITAMIN B 12 PO) Take 1 tablet by mouth daily.     [provider]  feeding supplement (BOOST / RESOURCE BREEZE) LIQD Take 1 Container by mouth 3 (three) times daily between meals. 04/19/17   Fritzi Mandes, MD  gabapentin (NEURONTIN) 400 MG capsule TAKE 1 CAPSULE TWICE DAILY 09/28/17   Birdie Sons, MD  HYDROcodone-acetaminophen (NORCO) 7.5-325 MG tablet Take 1-2 tablets by mouth every 6 (six) hours as needed for moderate pain. 03/26/18   Birdie Sons, MD  ibuprofen (ADVIL,MOTRIN) 200 MG tablet Take 400 mg by mouth daily as needed for headache or moderate pain.    [provider]  lidocaine-prilocaine (EMLA) cream Apply to affected area once 05/22/17   Earlie Server, MD  LORazepam (ATIVAN) 0.5 MG tablet Take 1 tablet (0.5 mg total) by mouth  every 6 (six) hours as needed (Nausea or vomiting). 05/27/17   Earlie Server, MD  meloxicam (MOBIC) 15 MG tablet TAKE 1 TABLET EVERY DAY AS NEEDED 08/07/17   Birdie Sons, MD  montelukast (SINGULAIR) 10 MG tablet Take 1 tablet (10 mg total) by mouth daily. 02/27/18 02/27/19  Laverle Hobby, MD  omeprazole (PRILOSEC) 20 MG capsule TAKE 1 CAPSULE EVERY DAY 02/10/18   Birdie Sons, MD  pravastatin (PRAVACHOL) 40 MG tablet TAKE 1 TABLET (40 MG TOTAL) BY MOUTH DAILY. 02/17/17   Birdie Sons, MD  Probiotic Product  (PROBIOTIC DAILY PO) Take 1 capsule by mouth daily.     [provider]  pyridOXINE (VITAMIN B-6) 50 MG tablet Take 1 tablet (50 mg total) by mouth daily. 06/24/17   Earlie Server, MD  Triamcinolone Acetonide (NASACORT ALLERGY 24HR NA) Place 1 spray into the nose daily as needed (allergies).    [provider]    Allergies as of 03/09/2018 - Review Complete 03/04/2018  Allergen Reaction Noted  . Betadine [povidone iodine] Other (See Comments) 12/02/2017  . Other Nausea And Vomiting 05/15/2017  . Sinus formula [cholestatin] Nausea Only 04/13/2015    Family History  Problem Relation Age of Onset  . Diabetes Mother        type 2  . Coronary artery disease Mother   . Deep vein thrombosis Mother   . Colon cancer Father   . Asthma Brother   . Diabetes Brother        type 2    Social History   Socioeconomic History  . Marital status: Divorced    Spouse name: Not on file  . Number of children: 3  . Years of education: Not on file  . Highest education level: 7th grade  Occupational History  . Occupation: retired  Scientific laboratory technician  . Financial resource strain: Not hard at all  . Food insecurity:    Worry: Never true    Inability: Never true  . Transportation needs:    Medical: No    Non-medical: No  Tobacco Use  . Smoking status: Former Smoker    Packs/day: 0.25    Years: 55.00    Pack years: 13.75    Types: Cigarettes    Last attempt to quit: 05/21/2017    Years since quitting: 0.8  . Smokeless tobacco: Never Used  . Tobacco comment: started age 43 1/2 to 1 ppd  Substance and Sexual Activity  . Alcohol use: Yes    Alcohol/week: 0.0 oz    Comment: occasionally drinks beer  . Drug use: No  . Sexual activity: Never  Lifestyle  . Physical activity:    Days per week: Not on file    Minutes per session: Not on file  . Stress: Not at all  Relationships  . Social connections:    Talks on phone: Not on file    Gets together: Not on file    Attends religious  service: Not on file    Active member of club or organization: Not on file    Attends meetings of clubs or organizations: Not on file    Relationship status: Not on file  . Intimate partner violence:    Fear of current or ex partner: Not on file    Emotionally abused: Not on file    Physically abused: Not on file    Forced sexual activity: Not on file  Other Topics Concern  . Not on file  Social History Narrative  .  Not on file    Review of Systems: See HPI, otherwise negative ROS  Physical Exam: BP 138/90   Pulse (!) 105   Temp (!) 96.3 F (35.7 C) (Tympanic)   Resp 17   Ht 5\' 5"  (1.651 m)   Wt 159 lb (72.1 kg)   SpO2 93%   BMI 26.46 kg/m  General:   Alert,  pleasant and cooperative in NAD Head:  Normocephalic and atraumatic. Neck:  Supple; no masses or thyromegaly. Lungs:  Clear throughout to auscultation, normal respiratory effort.    Heart:  +S1, +S2, Regular rate and rhythm, No edema. Abdomen:  Soft, nontender and nondistended. Normal bowel sounds, without guarding, and without rebound.   Neurologic:  Alert and  oriented x4;  grossly normal neurologically.  Impression/Plan: Kimberly [name] is here for a colonoscopy to be performed for history of colon cancer and complicated surgical history. Partial colectomy and end colostomy and mucous fistula in July 2018. Recent parastomal hernia leading to end ileostomy creation by Dr. Dahlia Byes in March 2019. Dr. Dahlia Byes is requesting colonoscopy to rule out any colon cancer or lesions prior to reversal.   Risks, benefits, limitations, and alternatives regarding  colonoscopy have been reviewed with the patient.  Questions have been answered.  All parties agreeable.   Virgel Manifold, MD  04/09/2018, 10:07 AM

## 2018-04-09 NOTE — Op Note (Addendum)
[facility] Patient Name: Kimberly [name] Procedure Date: 04/09/2018 9:22 AM MRN: [id number] Account #: [id number] Date of Birth: 01-Jan-1946 Admit Type: Outpatient Age: 72 Room: [facility] ENDO ROOM 1 Gender: Female Note Status: Finalized Procedure:            Colonoscopy via Stoma with Endoscopy of Hartmann Pouch Indications:          History of right hemicolectomy, Preoperative assessment                        for colostomy take-down Providers:            Eraina Winnie B. Bonna Gains MD, MD Referring MD:         Kirstie Peri. Caryn Section, MD (Referring MD) Medicines:            Monitored Anesthesia Care Complications:        No immediate complications. Procedure:            Pre-Anesthesia Assessment:                       - Prior to the procedure, a History and Physical was                        performed, and patient medications and allergies were                        reviewed. The patient is competent. The risks and                        benefits of the procedure and the sedation options and                        risks were discussed with the patient. All questions                        were answered and informed consent was obtained.                        Patient identification and proposed procedure were                        verified by the physician, the nurse, the                        anesthesiologist, the anesthetist and the technician in                        the pre-procedure area in the procedure room in the                        endoscopy suite. Mental Status Examination: normal.                        Airway Examination: normal oropharyngeal airway and                        neck mobility. Respiratory Examination: clear to                        auscultation. CV Examination: normal. Prophylactic  Antibiotics: The patient does not require prophylactic                        antibiotics. Prior Anticoagulants: The patient has                        taken no previous anticoagulant or antiplatelet agents.                        ASA Grade Assessment: III - A patient with severe                        systemic disease. After reviewing the risks and                        benefits, the patient was deemed in satisfactory                        condition to undergo the procedure. The anesthesia plan                        was to use monitored anesthesia care (MAC). Immediately                        prior to administration of medications, the patient was                        re-assessed for adequacy to receive sedatives. The                        heart rate, respiratory rate, oxygen saturations, blood                        pressure, adequacy of pulmonary ventilation, and                        response to care were monitored throughout the                        procedure. The physical status of the patient was                        re-assessed after the procedure.                       After obtaining informed consent, the endoscope was                        passed under direct vision. Throughout the procedure,                        the patient's blood pressure, pulse, and oxygen                        saturations were monitored continuously. The                        Colonoscope was introduced through the anus and                        advanced to  the the colocolonic anastomosis. The                        procedure was performed with ease. The patient                        tolerated the procedure well. The quality of the bowel                        preparation was fair. Findings:      Patient is status-post right hemicolectomy with a surgical anastomosis.      There was evidence of a patent Mucous fistula. This was characterized by       healthy appearing mucosa.      The colon was initially examined via the rectum and the colonoscope was       advanced to 20 cm from the anal verge. At this point the colon  was       angulated and it was not considered safe to advance the colonoscope past       this site. This is likely the area of the colo-colonic anatomosis. The       colonoscope was then introduced from the mucous fistula and advanced to       50 cm at which point the colon was angulated again (likely the same       anatomosis site mentioned above.      A patchy area of mildly thick white mucus (due to the diverted anatomy)       mucosa was found in the entire colon.      The retroflexed view of the distal rectum and anal verge was normal and       showed no anal or rectal abnormalities.      There is no endoscopic evidence of mass in the entire colon. Impression:           - Preparation of the colon was fair.                       - Patent Mucous fistula with healthy appearing mucosa                        in the entire examined colon.                       - Thick white mucus (due to the diverted anatomy)                        mucosa in the entire examined colon.                       - The distal rectum and anal verge are normal on                        retroflexion view.                       - No specimens collected.                       - Due to the fair prep, this was not a good exam to  rule out small or flat polyps. No large masses were                        seen. Patient will need a colonoscopy for evaluation of                        polyps 6-12 months after her colonoscopy reversal. This                        was discussed with her. Recommendation:       - Return to referring physician as previously scheduled.                       - Resume previous diet.                       - Continue present medications.                       - Discharge patient to home.                       - The findings and recommendations were discussed with                        the patient.                       - The findings and recommendations were discussed with                         the patient's family. Procedure Code(s):    --- Professional ---                       [phone], Colonoscopy, flexible; diagnostic, including                        collection of specimen(s) by brushing or washing, when                        performed (separate procedure) Diagnosis Code(s):    --- Professional ---                       Z90.49, Acquired absence of other specified parts of                        digestive tract                       Z01.818, Encounter for other preprocedural examination                       Z93.3, Colostomy status CPT copyright 2017 American Medical Association. All rights reserved. The codes documented in this report are preliminary and upon coder review may  be revised to meet current compliance requirements. Attending Participation:      I personally performed the entire procedure.  Vonda Antigua, MD Margretta Sidle B. Bonna Gains MD, MD 04/09/2018 10:22:15 AM This report has been signed electronically. Number of Addenda: 0 Note Initiated On: 04/09/2018 9:22 AM Total Procedure Duration: 0 hours 26 minutes 44 seconds  [facility]

## 2018-04-09 NOTE — Anesthesia Post-op Follow-up Note (Signed)
Anesthesia QCDR form completed.        

## 2018-04-10 ENCOUNTER — Telehealth: Payer: Self-pay | Admitting: *Deleted

## 2018-04-10 NOTE — Telephone Encounter (Signed)
Patient states that she got an abscess the last time she received iodine. She stated that if she comes in to contact with iodine her skin "erupts into an ugly sore" she wants it added to her allergy list. She agreed to take the contrast with steroids.

## 2018-04-10 NOTE — Telephone Encounter (Signed)
She had CT contrast done multiple times in the past. Iodine not listed as allergy. ???  Scheduled to have CT done at the end of August.   Can you clarify with her? Did she have a reaction recently?  If yes, we can order steroids to be used prior to CT scans.

## 2018-04-10 NOTE — Telephone Encounter (Signed)
Patient called to say that she does not want to have her CT with iodine. She is allergic to iodine. Please advise.

## 2018-04-12 ENCOUNTER — Encounter: Payer: Self-pay | Admitting: Gastroenterology

## 2018-04-13 ENCOUNTER — Other Ambulatory Visit: Payer: Self-pay | Admitting: Oncology

## 2018-04-13 ENCOUNTER — Telehealth: Payer: Self-pay | Admitting: General Practice

## 2018-04-13 MED ORDER — PREDNISONE 50 MG PO TABS: ORAL_TABLET | ORAL | 0 refills | Status: DC

## 2018-04-13 MED ORDER — DIPHENHYDRAMINE HCL 50 MG PO TABS: 50.0000 mg | ORAL_TABLET | Freq: Once | ORAL | 0 refills | Status: DC

## 2018-04-13 NOTE — Telephone Encounter (Signed)
Patient called and left a message on Friday at 1:27 asking for one of the nurses to give her a call. Please call patient and advise.

## 2018-04-13 NOTE — Telephone Encounter (Signed)
Lorri,  Please let patient know that I have sent her prescription for premedication.    Prednisone - 50 mg by mouth at 13 hours, 7 hours, and 1 hour before contrast media injection, plus Diphenhydramine (Benadryl) - 50 mg by mouth 1 hour before contrast medium  Thank you.;

## 2018-04-13 NOTE — Telephone Encounter (Signed)
Spoke with patient and assured her that Iodine is listed as an allergy in her chart.

## 2018-04-14 NOTE — Telephone Encounter (Signed)
Called patient with physician instructions regarding pre medication for CT. Patient verbalized understanding.

## 2018-04-20 ENCOUNTER — Telehealth: Payer: Self-pay | Admitting: Surgery

## 2018-04-20 NOTE — Telephone Encounter (Signed)
Patient stated her daughter in law and son had surgeries in the past for hernias and although mesh was used the y had a recurrent hernia's.  She asked if the same mesh was used and I let her know that certain mesh have been discontinued. She has an appointment 05/11/18 and we discussed speaking about this to Dr.Pabon at that visit.  Patient stated she is doing very well and no problems to date however she was just wondering.

## 2018-04-20 NOTE — Telephone Encounter (Signed)
Patient is calling and has some questions about her surgery please call patient and advise.

## 2018-04-23 ENCOUNTER — Other Ambulatory Visit: Payer: Self-pay | Admitting: Family Medicine

## 2018-04-23 DIAGNOSIS — M5136 Other intervertebral disc degeneration, lumbar region: Secondary | ICD-10-CM

## 2018-04-24 MED ORDER — HYDROCODONE-ACETAMINOPHEN 7.5-325 MG PO TABS: 1.0000 | ORAL_TABLET | Freq: Four times a day (QID) | ORAL | 0 refills | Status: DC | PRN

## 2018-04-27 ENCOUNTER — Other Ambulatory Visit: Payer: Self-pay | Admitting: Family Medicine

## 2018-04-27 DIAGNOSIS — E785 Hyperlipidemia, unspecified: Secondary | ICD-10-CM

## 2018-05-11 ENCOUNTER — Other Ambulatory Visit: Payer: Self-pay | Admitting: Surgery

## 2018-05-11 ENCOUNTER — Ambulatory Visit (INDEPENDENT_AMBULATORY_CARE_PROVIDER_SITE_OTHER): Payer: Medicare HMO | Admitting: Surgery

## 2018-05-11 ENCOUNTER — Encounter: Payer: Self-pay | Admitting: Surgery

## 2018-05-11 ENCOUNTER — Telehealth: Payer: Self-pay

## 2018-05-11 VITALS — BP 138/76 | HR 87 | Temp 97.7°F | Wt 160.0 lb

## 2018-05-11 DIAGNOSIS — E278 Other specified disorders of adrenal gland: Secondary | ICD-10-CM

## 2018-05-11 DIAGNOSIS — J869 Pyothorax without fistula: Secondary | ICD-10-CM | POA: Diagnosis not present

## 2018-05-11 DIAGNOSIS — T80212A Local infection due to central venous catheter, initial encounter: Secondary | ICD-10-CM | POA: Diagnosis not present

## 2018-05-11 DIAGNOSIS — E279 Disorder of adrenal gland, unspecified: Secondary | ICD-10-CM

## 2018-05-11 MED ORDER — DEXAMETHASONE 1 MG PO TABS: 1.0000 mg | ORAL_TABLET | Freq: Two times a day (BID) | ORAL | 0 refills | Status: DC

## 2018-05-11 MED ORDER — AMOXICILLIN-POT CLAVULANATE 875-125 MG PO TABS: 1.0000 | ORAL_TABLET | Freq: Two times a day (BID) | ORAL | 0 refills | Status: AC

## 2018-05-11 NOTE — Progress Notes (Signed)
Outpatient Surgical Follow Up  05/11/2018  Kimberly [name] is an 72 y.o. female.   Chief Complaint  Patient presents with  . Follow-up    Abdominal Wound Check    HPI: Kimberly [name] is following up a recent colonoscopy showing no evidence of cancer.  Here her main concern today is chronic drainage from the port site.  She does have some erythema and some pain on the right chest wall.  From her abdominal standpoint she denies any symptoms.  She is tolerating p.o her ileostomy is working well. I have personally review her CT scan one more time and she is got bilateral adrenal masses,  on the left side this is a 6 cm mass and this has not been work-up yet.  Past Medical History:  Diagnosis Date  . Breast cancer, left (HCC)    Lumpectomy and rad tx's.  . Chronic back pain   . Colon cancer ([place])   . Degenerative disc disease, lumbar 04/2013  . Dyspnea   . Family history of adverse reaction to anesthesia    son arrested after anesthesia  . GERD (gastroesophageal reflux disease)   . Hyperlipidemia   . Iron deficiency anemia 05/27/2017  . Neuropathy   . Osteoarthritis    of knee  . Osteoporosis   . Tobacco abuse   . Vitamin D deficiency     Past Surgical History:  Procedure Laterality Date  . APPENDECTOMY  1965  . BREAST EXCISIONAL BIOPSY Left 08/01/2003   lumpectomy rad 11/04-2/28/2005  . CESAREAN SECTION     x3  . COLON SURGERY    . COLONOSCOPY W/ POLYPECTOMY    . COLONOSCOPY WITH PROPOFOL N/A 04/09/2018   Procedure: COLONOSCOPY WITH PROPOFOL;  Surgeon: Virgel Manifold, MD;  Location: [facility];  Service: Gastroenterology;  Laterality: N/A;  . ESOPHAGOGASTRODUODENOSCOPY (EGD) WITH PROPOFOL N/A 04/04/2017   Procedure: ESOPHAGOGASTRODUODENOSCOPY (EGD) WITH PROPOFOL;  Surgeon: [name], MD;  Location: [facility];  Service: Endoscopy;  Laterality: N/A;  . HIP FRACTURE SURGERY Left 01/25/2012  . LAPAROTOMY N/A 04/15/2017   Procedure: EXPLORATORY LAPAROTOMY;  Surgeon:  Clayburn Pert, MD;  Location: [facility];  Service: General;  Laterality: N/A;  . NECK SURGERY  12/2011  . PARASTOMAL HERNIA REPAIR N/A 12/03/2017   Procedure: HERNIA REPAIR PARASTOMAL;  Surgeon: Jules Husbands, MD;  Location: [facility];  Service: General;  Laterality: N/A;  . PORTACATH PLACEMENT Right 05/21/2017   Procedure: INSERTION PORT-A-CATH;  Surgeon: Clayburn Pert, MD;  Location: [facility];  Service: General;  Laterality: Right;  . WRIST FRACTURE SURGERY      Family History  Problem Relation Age of Onset  . Diabetes Mother        type 2  . Coronary artery disease Mother   . Deep vein thrombosis Mother   . Colon cancer Father   . Asthma Brother   . Diabetes Brother        type 2    Social History:  reports that she quit smoking about a year ago. Her smoking use included cigarettes. She has a 13.75 pack-year smoking history. She has never used smokeless tobacco. She reports that she drinks alcohol. She reports that she does not use drugs.  Allergies:  Allergies  Allergen Reactions  . Contrast Media [Iodinated Diagnostic Agents]     Skin rash  . Betadine [Povidone Iodine] Other (See Comments)    Topical iodine she states "if you put it in an open sore it will cause a eating  ulcer."  . Other Nausea And Vomiting    Antihystamines  . Sinus Formula [Cholestatin] Nausea Only    Medications reviewed.    ROS Full ROS performed and is otherwise negative other than what is stated in HPI   BP 138/76   Pulse 87   Temp 97.7 F (36.5 C) (Oral)   Wt 160 lb (72.6 kg)   BMI 26.63 kg/m   Physical Exam  Constitutional: She is oriented to person, place, and time. She appears well-developed and well-nourished. No distress.  Neck: Normal range of motion. Neck supple. No JVD present. No tracheal deviation present. No thyromegaly present.  Pulmonary/Chest: Effort normal. No respiratory distress.  There is a port in place with some erythema and mild tenderness to palpation.   Abdominal: Soft. There is no tenderness. There is no rebound and no guarding.  An ileostomy and a parastomal hernia.  No peritonitis.  No infection.  There is a mucous fistula on the left side.  Neurological: She is alert and oriented to person, place, and time. No cranial nerve deficit. Coordination normal.  Skin: Skin is warm. Capillary refill takes 2 to 3 seconds. She is not diaphoretic.  Psychiatric: She has a normal mood and affect. Her behavior is normal. Judgment and thought content normal.  Nursing note and vitals reviewed.     Assessment/Plan: 72 year old female with history of colon cancer status post transverse colectomy then followed by completion right hemicolectomy with an end ileostomy and mucous fistula.  I do think that she is got a port infection that we need to address by removing the port. We will also start work-up for the adrenal mass to make sure there is no functional component. See her back in 2 weeks after she completes her interval CT scan ordered by oncology.  Likely at that time if there is no active disease we will schedule her for ileostomy takedown with parastomal hernia repair. 1. Adrenal mass (HCC) - Cortisol - Comprehensive metabolic panel; Future - CBC; Future - Metanephrines, plasma; Future - Aldosterone + renin activity w/ ratio; Future  Greater than 50% of the 25 minutes  visit was spent in counseling/coordination of care  PROCEDURE NOTE 1. Excision of port due to infection  EBL: minimal  Anesthesia: lidocaine 1% w epi 09NA  Complications:none  After informed consent was obtained patient was placed in the supine position and was prepped and draped in the usual sterile fashion.  There was a small pocket of pus that we culture.  The incision was created with a 10 blade knife.  On using Metzenbaum scissors were able to dissect through the cutaneous tissue.  The port was elevated and the prone sutures were removed.  Were able to remove the port and  place pressure on the right chest wall.  There was no evidence of bleeding. As of the concern of active infection I only close the subcutaneous tissue with 3-0 Vicryl's.  I left the skin open and placed a Band-Aid.  There were no immediate complications.    Kimberly [name] Hamman, MD [facility] General Surgeon

## 2018-05-11 NOTE — Patient Instructions (Signed)
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   We will see you back in 2 weeks.

## 2018-05-11 NOTE — Telephone Encounter (Signed)
Spoke with @  [facility] for specimen pick up. Placed in drop box and hung over door.

## 2018-05-13 ENCOUNTER — Other Ambulatory Visit: Payer: Self-pay

## 2018-05-13 ENCOUNTER — Other Ambulatory Visit
Admission: RE | Admit: 2018-05-13 | Discharge: 2018-05-13 | Disposition: A | Discharge status: Home / Self Care | Payer: Medicare HMO | Source: Ambulatory Visit | Attending: Surgery | Admitting: Surgery

## 2018-05-13 DIAGNOSIS — L02213 Cutaneous abscess of chest wall: Secondary | ICD-10-CM

## 2018-05-13 DIAGNOSIS — E278 Other specified disorders of adrenal gland: Secondary | ICD-10-CM

## 2018-05-13 DIAGNOSIS — E279 Disorder of adrenal gland, unspecified: Secondary | ICD-10-CM | POA: Insufficient documentation

## 2018-05-13 LAB — CBC
HCT: 40.4 % (ref 35.0–47.0)
HEMOGLOBIN: 14.1 g/dL (ref 12.0–16.0)
MCH: 33.2 pg (ref 26.0–34.0)
MCHC: 34.8 g/dL (ref 32.0–36.0)
MCV: 95.3 fL (ref 80.0–100.0)
Platelets: 350 10*3/uL (ref 150–440)
RBC: 4.24 MIL/uL (ref 3.80–5.20)
RDW: 14.7 % — ABNORMAL HIGH (ref 11.5–14.5)
WBC: 9.6 10*3/uL (ref 3.6–11.0)

## 2018-05-13 LAB — COMPREHENSIVE METABOLIC PANEL
ALK PHOS: 95 U/L (ref 38–126)
ALT: 10 U/L (ref 0–44)
AST: 15 U/L (ref 15–41)
Albumin: 3.8 g/dL (ref 3.5–5.0)
Anion gap: 9 (ref 5–15)
BUN: 9 mg/dL (ref 8–23)
CALCIUM: 8.9 mg/dL (ref 8.9–10.3)
CO2: 26 mmol/L (ref 22–32)
CREATININE: 0.67 mg/dL (ref 0.44–1.00)
Chloride: 103 mmol/L (ref 98–111)
Glucose, Bld: 106 mg/dL — ABNORMAL HIGH (ref 70–99)
Potassium: 4.1 mmol/L (ref 3.5–5.1)
SODIUM: 138 mmol/L (ref 135–145)
Total Bilirubin: 0.7 mg/dL (ref 0.3–1.2)
Total Protein: 7.9 g/dL (ref 6.5–8.1)

## 2018-05-13 LAB — CORTISOL: CORTISOL PLASMA: 3.3 ug/dL

## 2018-05-15 LAB — ANAEROBIC AND AEROBIC CULTURE

## 2018-05-16 LAB — METANEPHRINES, PLASMA: NORMETANEPHRINE FREE: 26 pg/mL (ref 0–145)

## 2018-05-18 ENCOUNTER — Telehealth: Payer: Self-pay

## 2018-05-18 LAB — ALDOSTERONE + RENIN ACTIVITY W/ RATIO: Aldosterone: 6.5 ng/dL (ref 0.0–30.0)

## 2018-05-18 NOTE — Telephone Encounter (Signed)
Called patient to let her know that her labs were normal and that Dr. Dahlia Byes will discuss results with her and what will need to be done next. Patient agreed and had no further questions.

## 2018-05-20 ENCOUNTER — Ambulatory Visit
Admission: RE | Admit: 2018-05-20 | Discharge: 2018-05-20 | Disposition: A | Discharge status: Home / Self Care | Payer: Medicare HMO | Source: Ambulatory Visit | Attending: Oncology | Admitting: Oncology

## 2018-05-20 ENCOUNTER — Ambulatory Visit: Admission: RE | Admit: 2018-05-20 | Payer: Medicare HMO | Source: Ambulatory Visit

## 2018-05-20 DIAGNOSIS — D3501 Benign neoplasm of right adrenal gland: Secondary | ICD-10-CM | POA: Insufficient documentation

## 2018-05-20 DIAGNOSIS — I700 Atherosclerosis of aorta: Secondary | ICD-10-CM | POA: Diagnosis not present

## 2018-05-20 DIAGNOSIS — J439 Emphysema, unspecified: Secondary | ICD-10-CM | POA: Insufficient documentation

## 2018-05-20 DIAGNOSIS — C184 Malignant neoplasm of transverse colon: Secondary | ICD-10-CM | POA: Diagnosis not present

## 2018-05-20 DIAGNOSIS — Z933 Colostomy status: Secondary | ICD-10-CM | POA: Insufficient documentation

## 2018-05-20 DIAGNOSIS — K435 Parastomal hernia without obstruction or  gangrene: Secondary | ICD-10-CM | POA: Insufficient documentation

## 2018-05-20 DIAGNOSIS — C189 Malignant neoplasm of colon, unspecified: Secondary | ICD-10-CM | POA: Diagnosis not present

## 2018-05-20 DIAGNOSIS — J9811 Atelectasis: Secondary | ICD-10-CM | POA: Insufficient documentation

## 2018-05-20 DIAGNOSIS — S2231XA Fracture of one rib, right side, initial encounter for closed fracture: Secondary | ICD-10-CM | POA: Diagnosis not present

## 2018-05-20 DIAGNOSIS — D3502 Benign neoplasm of left adrenal gland: Secondary | ICD-10-CM | POA: Diagnosis not present

## 2018-05-20 DIAGNOSIS — I2510 Atherosclerotic heart disease of native coronary artery without angina pectoris: Secondary | ICD-10-CM | POA: Insufficient documentation

## 2018-05-20 MED ORDER — IOPAMIDOL (ISOVUE-300) INJECTION 61%
100.0000 mL | Freq: Once | INTRAVENOUS | Status: AC | PRN
  Administered 2018-05-20: 100 mL via INTRAVENOUS

## 2018-05-22 ENCOUNTER — Encounter: Payer: Self-pay | Admitting: Oncology

## 2018-05-22 ENCOUNTER — Other Ambulatory Visit: Payer: Self-pay

## 2018-05-22 ENCOUNTER — Inpatient Hospital Stay: Payer: Medicare HMO | Attending: Oncology

## 2018-05-22 ENCOUNTER — Inpatient Hospital Stay (HOSPITAL_BASED_OUTPATIENT_CLINIC_OR_DEPARTMENT_OTHER): Payer: Medicare HMO | Admitting: Oncology

## 2018-05-22 VITALS — BP 118/79 | HR 63 | Temp 97.9°F | Resp 18 | Wt 161.8 lb

## 2018-05-22 DIAGNOSIS — Z853 Personal history of malignant neoplasm of breast: Secondary | ICD-10-CM | POA: Diagnosis not present

## 2018-05-22 DIAGNOSIS — Z79899 Other long term (current) drug therapy: Secondary | ICD-10-CM | POA: Diagnosis not present

## 2018-05-22 DIAGNOSIS — C184 Malignant neoplasm of transverse colon: Secondary | ICD-10-CM | POA: Insufficient documentation

## 2018-05-22 DIAGNOSIS — Z86010 Personal history of colonic polyps: Secondary | ICD-10-CM | POA: Diagnosis not present

## 2018-05-22 DIAGNOSIS — Z9221 Personal history of antineoplastic chemotherapy: Secondary | ICD-10-CM | POA: Diagnosis not present

## 2018-05-22 DIAGNOSIS — D72821 Monocytosis (symptomatic): Secondary | ICD-10-CM | POA: Insufficient documentation

## 2018-05-22 DIAGNOSIS — Z87891 Personal history of nicotine dependence: Secondary | ICD-10-CM

## 2018-05-22 DIAGNOSIS — R062 Wheezing: Secondary | ICD-10-CM | POA: Diagnosis not present

## 2018-05-22 DIAGNOSIS — G629 Polyneuropathy, unspecified: Secondary | ICD-10-CM | POA: Diagnosis not present

## 2018-05-22 LAB — CBC WITH DIFFERENTIAL/PLATELET
BASOS PCT: 1 %
Basophils Absolute: 0.1 10*3/uL (ref 0–0.1)
EOS ABS: 0.2 10*3/uL (ref 0–0.7)
EOS PCT: 2 %
HCT: 40.7 % (ref 35.0–47.0)
HEMOGLOBIN: 14.1 g/dL (ref 12.0–16.0)
LYMPHS ABS: 1.6 10*3/uL (ref 1.0–3.6)
Lymphocytes Relative: 16 %
MCH: 33.3 pg (ref 26.0–34.0)
MCHC: 34.7 g/dL (ref 32.0–36.0)
MCV: 95.9 fL (ref 80.0–100.0)
MONO ABS: 1.2 10*3/uL — AB (ref 0.2–0.9)
MONOS PCT: 12 %
Neutro Abs: 7.1 10*3/uL — ABNORMAL HIGH (ref 1.4–6.5)
Neutrophils Relative %: 69 %
PLATELETS: 316 10*3/uL (ref 150–440)
RBC: 4.24 MIL/uL (ref 3.80–5.20)
RDW: 14.3 % (ref 11.5–14.5)
WBC: 10.3 10*3/uL (ref 3.6–11.0)

## 2018-05-22 LAB — COMPREHENSIVE METABOLIC PANEL
ALK PHOS: 80 U/L (ref 38–126)
ALT: 11 U/L (ref 0–44)
AST: 17 U/L (ref 15–41)
Albumin: 3.6 g/dL (ref 3.5–5.0)
Anion gap: 8 (ref 5–15)
BUN: 14 mg/dL (ref 8–23)
CALCIUM: 8.8 mg/dL — AB (ref 8.9–10.3)
CHLORIDE: 102 mmol/L (ref 98–111)
CO2: 25 mmol/L (ref 22–32)
CREATININE: 0.76 mg/dL (ref 0.44–1.00)
GFR calc non Af Amer: >60 mL/min (ref >60)
Glucose, Bld: 88 mg/dL (ref 70–99)
Potassium: 4.3 mmol/L (ref 3.5–5.1)
SODIUM: 135 mmol/L (ref 135–145)
Total Bilirubin: 0.7 mg/dL (ref 0.3–1.2)
Total Protein: 7.2 g/dL (ref 6.5–8.1)

## 2018-05-22 NOTE — Progress Notes (Signed)
[facility] Follow up  Visit:  Patient Care Team: Birdie Sons, MD as PCP - General (Family Medicine) Earlie Server, MD as Consulting Physician (Oncology) Noreene Filbert, MD as Referring Physician (Radiation Oncology) Jules Husbands, MD as Consulting Physician (General Surgery)  PURPOSE OF VISIT: Adjuvant chemotherapy for colon cancer  HISTORY OF PRESENTING ILLNESS: Kimberly [name] 72 y.o. female with PMH listed as below presents for follow up for the management of Stage IIIB Colon Cancer Patient reports a remote history of breast cancer s/p lumpectomy and radiation treatments. Patient had been having abdominal pain, weight loss and bloating.  CT scan showed partial obstruction at the level of transverse colon and ileocolic mesenteric adenopathy. She was prepared for a colonoscopy on 04/15/2017. She started to have worsened abdominal pain, nausea vomiting, unable to maintain oral intake and was sent to ED. She was found to have bowel obstruction and underwent  exploratory laparotomy with transverse colectomy, with end colostomy and mucous fistula formation for obstructing colon lesion. Differential diagnosis prior to surgery was metastatic breast cancer in colon vs colon cancer. The patient did have a colonoscopy 2 years ago without the mass being present at that time but did have 2 small polyps in the transverse colon that were removed.  04/15/2017 Patient underwent transverse colon resection.  Pathology showed T4aN1 moderate differentiated adenocarcinoma, negative margin, 3/16 lymph nodes involved with cancer. Low probability of MSI-H.   # Genetic test INVITAE came back negative. No pathogenetic sequence variants or deletions/duplications identified. Results was scanned to [facility] (a copy of the report was provided to patient and her daughter)  # # baseline CEA is 2.1  TREATMENT: adjuvant FOLFOX, Oxaliplatin was discontinued after 3 cycles due to worsening of pre-existing  neuropathy. Finished another 9 cycles of 5-FU.  S/p adjuvant RT.   INTERVAL HISTORY Patient presents for follow-up of colon cancer.  During the interval she has had surveillance CT done. She also has had skin infection around her port site.  Had Mediport taking out by surgeon. She still has colostomy and is going to discuss with surgeon for colostomy reversal.  Denies any fever or chills.  Appetite is good.  Denies any abdominal pain or change of bowel habit no blood in the stool. Chronic pre-existing neuropathy stable. . Review of Systems  Constitutional: Negative for chills, fatigue, fever and unexpected weight change.  HENT:   Negative for hearing loss, lump/mass and nosebleeds.   Eyes: Negative for eye problems.  Respiratory: Negative for chest tightness, cough and shortness of breath.   Cardiovascular: Negative for chest pain and leg swelling.  Gastrointestinal: Negative for abdominal distention, abdominal pain, constipation and nausea.  Endocrine: Negative for hot flashes.  Genitourinary: Negative for difficulty urinating, dysuria and frequency.   Musculoskeletal: Negative for arthralgias, gait problem and myalgias.  Skin: Negative for rash and wound.  Neurological: Negative for dizziness, gait problem and headaches.       Chronic numbness and tingling of fingertips and toes.  Hematological: Negative for adenopathy. Does not bruise/bleed easily.  Psychiatric/Behavioral: Negative for confusion, decreased concentration and depression. The patient is not nervous/anxious.       MEDICAL HISTORY: Past Medical History:  Diagnosis Date  . Breast cancer, left (HCC)    Lumpectomy and rad tx's.  . Chronic back pain   . Colon cancer ([place])   . Degenerative disc disease, lumbar 04/2013  . Dyspnea   . Family history of adverse reaction to anesthesia    son  arrested after anesthesia  . GERD (gastroesophageal reflux disease)   . Hyperlipidemia   . Iron deficiency anemia 05/27/2017  .  Neuropathy   . Osteoarthritis    of knee  . Osteoporosis   . Tobacco abuse   . Vitamin D deficiency     SURGICAL HISTORY: Past Surgical History:  Procedure Laterality Date  . APPENDECTOMY  1965  . BREAST EXCISIONAL BIOPSY Left 08/01/2003   lumpectomy rad 11/04-2/28/2005  . CESAREAN SECTION     x3  . COLON SURGERY    . COLONOSCOPY W/ POLYPECTOMY    . COLONOSCOPY WITH PROPOFOL N/A 04/09/2018   Procedure: COLONOSCOPY WITH PROPOFOL;  Surgeon: Virgel Manifold, MD;  Location: [facility];  Service: Gastroenterology;  Laterality: N/A;  . ESOPHAGOGASTRODUODENOSCOPY (EGD) WITH PROPOFOL N/A 04/04/2017   Procedure: ESOPHAGOGASTRODUODENOSCOPY (EGD) WITH PROPOFOL;  Surgeon: [name], MD;  Location: [facility];  Service: Endoscopy;  Laterality: N/A;  . HIP FRACTURE SURGERY Left 01/25/2012  . LAPAROTOMY N/A 04/15/2017   Procedure: EXPLORATORY LAPAROTOMY;  Surgeon: Clayburn Pert, MD;  Location: [facility];  Service: General;  Laterality: N/A;  . NECK SURGERY  12/2011  . PARASTOMAL HERNIA REPAIR N/A 12/03/2017   Procedure: HERNIA REPAIR PARASTOMAL;  Surgeon: Jules Husbands, MD;  Location: [facility];  Service: General;  Laterality: N/A;  . PORTACATH PLACEMENT Right 05/21/2017   Procedure: INSERTION PORT-A-CATH;  Surgeon: Clayburn Pert, MD;  Location: [facility];  Service: General;  Laterality: Right;  . WRIST FRACTURE SURGERY      SOCIAL HISTORY: Social History   Socioeconomic History  . Marital status: Divorced    Spouse name: Not on file  . Number of children: 3  . Years of education: Not on file  . Highest education level: 7th grade  Occupational History  . Occupation: retired  Scientific laboratory technician  . Financial resource strain: Not hard at all  . Food insecurity:    Worry: Never true    Inability: Never true  . Transportation needs:    Medical: No    Non-medical: No  Tobacco Use  . Smoking status: Former Smoker    Packs/day: 0.25    Years: 55.00    Pack years: 13.75     Types: Cigarettes    Last attempt to quit: 05/21/2017    Years since quitting: 1.0  . Smokeless tobacco: Never Used  . Tobacco comment: started age 89 1/2 to 1 ppd  Substance and Sexual Activity  . Alcohol use: Yes    Alcohol/week: 0.0 standard drinks    Comment: occasionally drinks beer  . Drug use: No  . Sexual activity: Never  Lifestyle  . Physical activity:    Days per week: Not on file    Minutes per session: Not on file  . Stress: Not at all  Relationships  . Social connections:    Talks on phone: Not on file    Gets together: Not on file    Attends religious service: Not on file    Active member of club or organization: Not on file    Attends meetings of clubs or organizations: Not on file    Relationship status: Not on file  . Intimate partner violence:    Fear of current or ex partner: Not on file    Emotionally abused: Not on file    Physically abused: Not on file    Forced sexual activity: Not on file  Other Topics Concern  . Not on file  Social History Narrative  .  Not on file    FAMILY HISTORY Family History  Problem Relation Age of Onset  . Diabetes Mother        type 2  . Coronary artery disease Mother   . Deep vein thrombosis Mother   . Colon cancer Father   . Asthma Brother   . Diabetes Brother        type 2    ALLERGIES:  is allergic to contrast media [iodinated diagnostic agents]; betadine [povidone iodine]; other; and sinus formula [cholestatin].  MEDICATIONS:  Current Outpatient Medications  Medication Sig Dispense Refill  . budesonide-formoterol (SYMBICORT) 80-4.5 MCG/ACT inhaler Inhale 2 puffs into the lungs 2 (two) times daily.    . Calcium Citrate-Vitamin D (CITRACAL/VITAMIN D PO) Take 1 tablet by mouth daily.     . Cyanocobalamin (VITAMIN B 12 PO) Take 1 tablet by mouth daily.     . feeding supplement (BOOST / RESOURCE BREEZE) LIQD Take 1 Container by mouth 3 (three) times daily between meals. 30 Container 0  . gabapentin (NEURONTIN)  400 MG capsule TAKE 1 CAPSULE TWICE DAILY 180 capsule 4  . HYDROcodone-acetaminophen (NORCO) 7.5-325 MG tablet Take 1-2 tablets by mouth every 6 (six) hours as needed for moderate pain. 120 tablet 0  . LORazepam (ATIVAN) 0.5 MG tablet Take 1 tablet (0.5 mg total) by mouth every 6 (six) hours as needed (Nausea or vomiting). 30 tablet 0  . meloxicam (MOBIC) 15 MG tablet TAKE 1 TABLET EVERY DAY AS NEEDED 90 tablet 4  . montelukast (SINGULAIR) 10 MG tablet Take 1 tablet (10 mg total) by mouth daily. 30 tablet 3  . omeprazole (PRILOSEC) 20 MG capsule TAKE 1 CAPSULE EVERY DAY 90 capsule 4  . pravastatin (PRAVACHOL) 40 MG tablet TAKE 1 TABLET EVERY DAY 90 tablet 3  . Probiotic Product (PROBIOTIC DAILY PO) Take 1 capsule by mouth daily.     . Triamcinolone Acetonide (NASACORT ALLERGY 24HR NA) Place 1 spray into the nose daily as needed (allergies).    . Cholecalciferol (VITAMIN D PO) Take by mouth.    . dexamethasone (DECADRON) 1 MG tablet Take 1 tablet (1 mg total) by mouth 2 (two) times daily with a meal. Take 1 tablet the night before your test. (Patient not taking: Reported on 05/22/2018) 1 tablet 0  . diphenhydrAMINE (BENADRYL) 50 MG tablet Take 1 tablet (50 mg total) by mouth once for 1 dose. Take 20m 1 hour before contrast study. 1 tablet 0  . ibuprofen (ADVIL,MOTRIN) 200 MG tablet Take 400 mg by mouth daily as needed for headache or moderate pain.    . predniSONE (DELTASONE) 50 MG tablet Take 539mby mouth 13 hours, 7 hours, and 1 hour before contrast media injection (Patient not taking: Reported on 05/22/2018) 3 tablet 0  . pyridOXINE (VITAMIN B-6) 50 MG tablet Take 1 tablet (50 mg total) by mouth daily. (Patient not taking: Reported on 05/22/2018) 30 tablet 3   No current facility-administered medications for this visit.    Facility-Administered Medications Ordered in Other Visits  Medication Dose Route Frequency Provider Last Rate Last Dose  . 0.9 %  sodium chloride infusion   Intravenous  Continuous YuEarlie ServerMD   Stopped at 07/22/17 1150  . heparin lock flush 100 unit/mL  500 Units Intravenous Once YuEarlie ServerMD      . heparin lock flush 100 unit/mL  500 Units Intravenous Once YuEarlie ServerMD      . heparin lock flush 100 unit/mL  500 Units Intravenous  Once Earlie Server, MD      . sodium chloride flush (NS) 0.9 % injection 10 mL  10 mL Intracatheter PRN Earlie Server, MD      . sodium chloride flush (NS) 0.9 % injection 10 mL  10 mL Intravenous PRN Earlie Server, MD   10 mL at 05/29/17 1347    PHYSICAL EXAMINATION:  ECOG PERFORMANCE STATUS: 0 - Asymptomatic  Vitals:   05/22/18 1046  BP: 118/79  Pulse: 63  Resp: 18  Temp: 97.9 F (36.6 C)   Filed Weights   05/22/18 1046  Weight: 161 lb 12.8 oz (73.4 kg)    Physical Exam  Constitutional: She is oriented to person, place, and time and well-developed, well-nourished, and in no distress. No distress.  HENT:  Head: Normocephalic and atraumatic.  Nose: Nose normal.  Mouth/Throat: Oropharynx is clear and moist. No oropharyngeal exudate.  Eyes: Pupils are equal, round, and reactive to light. Conjunctivae and EOM are normal. Left eye exhibits no discharge. No scleral icterus.  Neck: Normal range of motion. Neck supple.  Cardiovascular: Normal rate, regular rhythm and normal heart sounds.  No murmur heard. Pulmonary/Chest: Effort normal. No respiratory distress. She has no wheezes.  Abdominal: Soft. Bowel sounds are normal. She exhibits no distension and no mass. There is no tenderness. There is no rebound.  (+)Colostomy  Musculoskeletal: Normal range of motion. She exhibits no edema.  Lymphadenopathy:    She has no cervical adenopathy.  Neurological: She is alert and oriented to person, place, and time. No cranial nerve deficit. She exhibits normal muscle tone. Gait normal. Coordination normal.  Skin: Skin is warm and dry. She is not diaphoretic. No erythema.  Psychiatric: Affect and judgment normal.      LABORATORY DATA: I have  personally reviewed the data as listed: CBC Latest Ref Rng & Units 05/22/2018 05/13/2018 02/18/2018  WBC 3.6 - 11.0 K/uL 10.3 9.6 9.3  Hemoglobin 12.0 - 16.0 g/dL 14.1 14.1 14.6  Hematocrit 35.0 - 47.0 % 40.7 40.4 41.9  Platelets 150 - 440 K/uL 316 350 298   CMP Latest Ref Rng & Units 05/22/2018 05/13/2018 02/18/2018  Glucose 70 - 99 mg/dL 88 106(H) 196(H)  BUN 8 - 23 mg/dL _0 Creatinine 0.44 - 1.00 mg/dL 0.76 0.67 0.72  Sodium 135 - 145 mmol/L 135 138 135  Potassium 3.5 - 5.1 mmol/L 4.3 4.1 3.6  Chloride 98 - 111 mmol/L 102 103 103  CO2 22 - 32 mmol/L _1 Calcium 8.9 - 10.3 mg/dL 8.8(L) 8.9 9.1  Total Protein 6.5 - 8.1 g/dL 7.2 7.9 7.8  Total Bilirubin 0.3 - 1.2 mg/dL 0.7 0.7 0.4  Alkaline Phos 38 - 126 U/L 80 95 107  AST 15 - 41 U/L _2 ALT 0 - 44 U/L _3 RADIOGRAPHIC STUDIES: I have personally reviewed the radiological images as listed and agree with the findings in the report 04/08/2017 CT abdomen pelvis w contrast IMPRESSION: 1. Partial obstruction at the level of the transverse colon, likely secondary to carcinoma. 2. Ileocolic mesenteric adenopathy, suspicious for nodal metastasis. 3.  Aortic Atherosclerosis (ICD10-I70.0). 4. Bilateral adrenal adenomas. 04/21/2017 CT abdomen pelvis with contrast. IMPRESSION: Postsurgical changes consistent with the given clinical history. A small amount of fluid in a year is noted surrounding the right lower quadrant ostomy likely felt to be postoperative in nature. No definitive abscess is seen. The patient's palpable abnormality likely corresponds to the small fluid collection.  PATHOLOGY DIAGNOSIS:  A. COLON MASS, TRANSVERSE; TRANSVERSE COLECTOMY:  - INVASIVE ADENOCARCINOMA, MODERATELY DIFFERENTIATED.  - THREE OF SIXTEEN LYMPH NODES INVOLVED BY METASTASIS (3/16).  - LYMPHOVASCULAR AND PERINEURAL INVASION PRESENT.  - SEE SUMMARY BELOW.  Surgical Pathology Cancer Case Summary  COLON AND RECTUM:  Procedure:  transverse colectomy  Tumor Site:  transverse colon  Tumor Size: Greatest dimension: 2.3 cm  Macroscopic Tumor Perforation: not specified  Histologic Type: adenocarcinoma  Histologic Grade: G2, moderately differentiated  Tumor Extension: tumor invades the visceral peritoneum  Margins: all margins are uninvolved by invasive carcinoma, high-grade dysplasia, intramucosal adenocarcinoma, and adenoma  Treatment Effect: no known presurgical therapy  Lymphovascular Invasion: present  Perineural Invasion: present  Tumor Deposits: not identified  Regional Lymph Nodes: # examined: 16  # involved: 3  Pathologic Stage Classification (pTNM, AJCC 8th Edition): pT4a pN1b   ADDENDUM:   MICROSATELLITE INSTABILITY IMMUNOHISTOCHEMISTRY  MISMATCH REPAIR PROTEINS:   MLH1: Intact nuclear expression  MSH2: Intact nuclear expression  MSH6: Intact nuclear expression  PMS2: Intact nuclear expression   Interpretation: No loss of nuclear expression of mismatch repair  proteins: Low probability of MSI-H.    RADIOGRAPHIC STUDIES: I have personally reviewed the radiological images as listed and agreed with the findings in the report. 1. Previous collection of gas and fluid along the right anterior abdominal wall subcutaneous tissues and superficial surface of the rectus abdominus has resolved, with some mild residual scarring in this vicinity. 2. Right ileostomy with peristomal hernia containing small bowel loops but without complicating feature. Left pouch colostomy noted. 3. No findings of active malignancy in the chest, abdomen, or pelvis. 4. Airway plugging in the lower lobes and left upper lobe, with mild atelectasis in the lower lobes. Bilateral airway thickening potentially from bronchitis. 5. Subacute healing fracture the right anterior fourth rib with questionable nondisplaced right anterior fifth rib fracture. 6. Other imaging findings of potential clinical significance:  Aortic Atherosclerosis (ICD10-I70.0). Coronary atherosclerosis. Emphysema (ICD10-J43.9). Bilateral adrenal adenomas.   ASSESSMENT/PLAN 73yo female who has MSI  stable stage IIIB Colon Cancer finishing adjuvant 5-FU   Cancer Staging Malignant neoplasm of transverse colon [facility]) Staging form: Colon and Rectum, AJCC 8th Edition - Clinical stage from 04/15/2017: Stage IIIB (cT4a, cN1b, cM0) - Signed by Earlie Server, MD on 04/26/2017  1. Malignant neoplasm of transverse colon ([place])    #Stage III colon cancer, S/p Adjuvant chemotherapy and RT.  CT scan was independently reviewed by me and discussed with patient.  No signs of metastatic disease or recurrence. CBC CMP reviewed.  Stable.  CEA is pending. Plan repeat in 3 months.   # Persistent neutrophilia and monocytosis, BCR ABL. FISH of peripheral blood came back negative. Flow cytometry showed absolute monocytosis with phenotypic aberrancy.  Discussed with patient about proceeding a bone marrow biopsy for further evaluation of the abnormality.  Also discussed with daughter over the phone. Patient and daughter prefer to postpone bone marrow biopsy at this point and will proceed after she had colostomy reversal. # Wheezing, likely due to COPD.  Previously referred to [place] for further evaluation.   # Grade 2 neuropathy: Pre-existing peripheral neuropathy, stable.  Continue B6 supplementation.  Declined other options for treatment of neuropathy.   Follow up in  3 months.  Patient knows to call if any questions or concerns. Total face to face encounter time for this patient visit was 15 min. >50% of the time was  spent in counseling and coordination of care.  Earlie Server, MD, PhD  Hematology [facility]- [phone] 05/22/18

## 2018-05-22 NOTE — Progress Notes (Signed)
Patient here for follow up. No changes from last visit.  °

## 2018-05-23 LAB — CEA: CEA: 2.8 ng/mL (ref 0.0–4.7)

## 2018-05-27 ENCOUNTER — Encounter: Payer: Self-pay | Admitting: Surgery

## 2018-05-27 ENCOUNTER — Other Ambulatory Visit: Payer: Medicare HMO

## 2018-05-27 ENCOUNTER — Ambulatory Visit (INDEPENDENT_AMBULATORY_CARE_PROVIDER_SITE_OTHER): Payer: Medicare HMO | Admitting: Surgery

## 2018-05-27 VITALS — BP 126/79 | HR 74 | Temp 97.9°F | Resp 12 | Ht 65.0 in | Wt 161.0 lb

## 2018-05-27 DIAGNOSIS — C184 Malignant neoplasm of transverse colon: Secondary | ICD-10-CM | POA: Diagnosis not present

## 2018-05-27 NOTE — Progress Notes (Signed)
Outpatient Surgical Follow Up  05/27/2018  Kimberly [name] is an 72 y.o. female.   Chief Complaint  Patient presents with  . Follow-up    discuss surgery and review colonoscopy    HPI: Kimberly [name] is following up for discussion of colostomy takedown.  In addition to that I have worked her up for her bilateral adrenal masses.  All the results came back is now nonfunctional bilateral adrenal masses. He has been feeling much better after the port has been removed.  No fevers or chills and there is a calm complete resolution of the erythema associated to the port.  She is eating well and her ostomy is functioning well.  No abdominal pain no fevers and no complaints at this time. I have again personally review the CT scan with her and there is a large new parastomal hernia.  There are the bilateral adrenal masses.  Also the barium enema show no evidence of obstructive process  Past Medical History:  Diagnosis Date  . Breast cancer, left (HCC)    Lumpectomy and rad tx's.  . Chronic back pain   . Colon cancer ([place])   . Degenerative disc disease, lumbar 04/2013  . Dyspnea   . Family history of adverse reaction to anesthesia    son arrested after anesthesia  . GERD (gastroesophageal reflux disease)   . Hyperlipidemia   . Iron deficiency anemia 05/27/2017  . Neuropathy   . Osteoarthritis    of knee  . Osteoporosis   . Tobacco abuse   . Vitamin D deficiency     Past Surgical History:  Procedure Laterality Date  . APPENDECTOMY  1965  . BREAST EXCISIONAL BIOPSY Left 08/01/2003   lumpectomy rad 11/04-2/28/2005  . CESAREAN SECTION     x3  . COLON SURGERY    . COLONOSCOPY W/ POLYPECTOMY    . COLONOSCOPY WITH PROPOFOL N/A 04/09/2018   Procedure: COLONOSCOPY WITH PROPOFOL;  Surgeon: Virgel Manifold, MD;  Location: [facility];  Service: Gastroenterology;  Laterality: N/A;  . ESOPHAGOGASTRODUODENOSCOPY (EGD) WITH PROPOFOL N/A 04/04/2017   Procedure: ESOPHAGOGASTRODUODENOSCOPY (EGD) WITH  PROPOFOL;  Surgeon: [name], MD;  Location: [facility];  Service: Endoscopy;  Laterality: N/A;  . HIP FRACTURE SURGERY Left 01/25/2012  . LAPAROTOMY N/A 04/15/2017   Procedure: EXPLORATORY LAPAROTOMY;  Surgeon: Clayburn Pert, MD;  Location: [facility];  Service: General;  Laterality: N/A;  . NECK SURGERY  12/2011  . PARASTOMAL HERNIA REPAIR N/A 12/03/2017   Procedure: HERNIA REPAIR PARASTOMAL;  Surgeon: Jules Husbands, MD;  Location: [facility];  Service: General;  Laterality: N/A;  . PORTACATH PLACEMENT Right 05/21/2017   Procedure: INSERTION PORT-A-CATH;  Surgeon: Clayburn Pert, MD;  Location: [facility];  Service: General;  Laterality: Right;  . WRIST FRACTURE SURGERY      Family History  Problem Relation Age of Onset  . Diabetes Mother        type 2  . Coronary artery disease Mother   . Deep vein thrombosis Mother   . Colon cancer Father   . Asthma Brother   . Diabetes Brother        type 2    Social History:  reports that she quit smoking about a year ago. Her smoking use included cigarettes. She has a 13.75 pack-year smoking history. She has never used smokeless tobacco. She reports that she drinks alcohol. She reports that she does not use drugs.  Allergies:  Allergies  Allergen Reactions  . Contrast Media [Iodinated Diagnostic Agents]  Skin rash  . Betadine [Povidone Iodine] Other (See Comments)    Topical iodine she states "if you put it in an open sore it will cause a eating ulcer."  . Other Nausea And Vomiting    Antihystamines  . Sinus Formula [Cholestatin] Nausea Only    Medications reviewed.    ROS Full ROS performed and is otherwise negative other than what is stated in HPI   BP 126/79   Pulse 74   Temp 97.9 F (36.6 C) (Skin)   Resp 12   Ht 5\' 5"  (1.651 m)   Wt 161 lb (73 kg)   SpO2 93%   BMI 26.79 kg/m   Physical Exam  Constitutional: She is oriented to person, place, and time. She appears well-developed and well-nourished. No  distress.  Neck: Normal range of motion. Neck supple. No JVD present. No tracheal deviation present. No thyromegaly present.  Cardiovascular: Regular rhythm and normal heart sounds.  No murmur heard. Pulmonary/Chest: Effort normal and breath sounds normal. No stridor. No respiratory distress. She has no wheezes. She has no rales.  Abdominal: Soft. She exhibits no distension and no mass. There is no tenderness. There is no guarding.  Midline scar, Right ileostomy pink/patent. Parastomal hernia. MF pink. No peritonitis  Musculoskeletal: Normal range of motion. She exhibits no edema.  Neurological: She is alert and oriented to person, place, and time. No cranial nerve deficit. Coordination normal.  Skin: Skin is warm and dry. Capillary refill takes less than 2 seconds. She is not diaphoretic.  Psychiatric: She has a normal mood and affect. Her behavior is normal. Judgment and thought content normal.  Nursing note and vitals reviewed.      Assessment/Plan: 72 year old female with a history of transverse colon cancer status post colectomy.  She had complications dated to her colostomy and I had to take her back and did a completion right hemicolectomy with an end ileostomy and mucous fistula. Developed an abscess and parastomal hernia.  She still has a parastomal hernia but the abscess has completely resolved. We will proceed with colostomy takedown and parastomal hernia repair.  Procedure discussed with the patient and with the family in detail.  Risk benefit and possible complications including but not limited to: Bleeding, infection, anastomotic leak, chronic pain, prolonged hospitalization and even death.  She understands and wishes to proceed.   [name] Hamman, MD [facility] General Surgeon

## 2018-05-27 NOTE — H&P (View-Only) (Signed)
Outpatient Surgical Follow Up  05/27/2018  Kimberly [name] is an 72 y.o. female.   Chief Complaint  Patient presents with  . Follow-up    discuss surgery and review colonoscopy    HPI: Kimberly [name] is following up for discussion of colostomy takedown.  In addition to that I have worked her up for her bilateral adrenal masses.  All the results came back is now nonfunctional bilateral adrenal masses. He has been feeling much better after the port has been removed.  No fevers or chills and there is a calm complete resolution of the erythema associated to the port.  She is eating well and her ostomy is functioning well.  No abdominal pain no fevers and no complaints at this time. I have again personally review the CT scan with her and there is a large new parastomal hernia.  There are the bilateral adrenal masses.  Also the barium enema show no evidence of obstructive process  Past Medical History:  Diagnosis Date  . Breast cancer, left (HCC)    Lumpectomy and rad tx's.  . Chronic back pain   . Colon cancer ([place])   . Degenerative disc disease, lumbar 04/2013  . Dyspnea   . Family history of adverse reaction to anesthesia    son arrested after anesthesia  . GERD (gastroesophageal reflux disease)   . Hyperlipidemia   . Iron deficiency anemia 05/27/2017  . Neuropathy   . Osteoarthritis    of knee  . Osteoporosis   . Tobacco abuse   . Vitamin D deficiency     Past Surgical History:  Procedure Laterality Date  . APPENDECTOMY  1965  . BREAST EXCISIONAL BIOPSY Left 08/01/2003   lumpectomy rad 11/04-2/28/2005  . CESAREAN SECTION     x3  . COLON SURGERY    . COLONOSCOPY W/ POLYPECTOMY    . COLONOSCOPY WITH PROPOFOL N/A 04/09/2018   Procedure: COLONOSCOPY WITH PROPOFOL;  Surgeon: Virgel Manifold, MD;  Location: [facility];  Service: Gastroenterology;  Laterality: N/A;  . ESOPHAGOGASTRODUODENOSCOPY (EGD) WITH PROPOFOL N/A 04/04/2017   Procedure: ESOPHAGOGASTRODUODENOSCOPY (EGD) WITH  PROPOFOL;  Surgeon: [name], MD;  Location: [place];  Service: Endoscopy;  Laterality: N/A;  . HIP FRACTURE SURGERY Left 01/25/2012  . LAPAROTOMY N/A 04/15/2017   Procedure: EXPLORATORY LAPAROTOMY;  Surgeon: Clayburn Pert, MD;  Location: [facility];  Service: General;  Laterality: N/A;  . NECK SURGERY  12/2011  . PARASTOMAL HERNIA REPAIR N/A 12/03/2017   Procedure: HERNIA REPAIR PARASTOMAL;  Surgeon: Jules Husbands, MD;  Location: [facility];  Service: General;  Laterality: N/A;  . PORTACATH PLACEMENT Right 05/21/2017   Procedure: INSERTION PORT-A-CATH;  Surgeon: Clayburn Pert, MD;  Location: [facility];  Service: General;  Laterality: Right;  . WRIST FRACTURE SURGERY      Family History  Problem Relation Age of Onset  . Diabetes Mother        type 2  . Coronary artery disease Mother   . Deep vein thrombosis Mother   . Colon cancer Father   . Asthma Brother   . Diabetes Brother        type 2    Social History:  reports that she quit smoking about a year ago. Her smoking use included cigarettes. She has a 13.75 pack-year smoking history. She has never used smokeless tobacco. She reports that she drinks alcohol. She reports that she does not use drugs.  Allergies:  Allergies  Allergen Reactions  . Contrast Media [Iodinated Diagnostic Agents]  Skin rash  . Betadine [Povidone Iodine] Other (See Comments)    Topical iodine she states "if you put it in an open sore it will cause a eating ulcer."  . Other Nausea And Vomiting    Antihystamines  . Sinus Formula [Cholestatin] Nausea Only    Medications reviewed.    ROS Full ROS performed and is otherwise negative other than what is stated in HPI   BP 126/79   Pulse 74   Temp 97.9 F (36.6 C) (Skin)   Resp 12   Ht 5\' 5"  (1.651 m)   Wt 161 lb (73 kg)   SpO2 93%   BMI 26.79 kg/m   Physical Exam  Constitutional: She is oriented to person, place, and time. She appears well-developed and well-nourished. No  distress.  Neck: Normal range of motion. Neck supple. No JVD present. No tracheal deviation present. No thyromegaly present.  Cardiovascular: Regular rhythm and normal heart sounds.  No murmur heard. Pulmonary/Chest: Effort normal and breath sounds normal. No stridor. No respiratory distress. She has no wheezes. She has no rales.  Abdominal: Soft. She exhibits no distension and no mass. There is no tenderness. There is no guarding.  Midline scar, Right ileostomy pink/patent. Parastomal hernia. MF pink. No peritonitis  Musculoskeletal: Normal range of motion. She exhibits no edema.  Neurological: She is alert and oriented to person, place, and time. No cranial nerve deficit. Coordination normal.  Skin: Skin is warm and dry. Capillary refill takes less than 2 seconds. She is not diaphoretic.  Psychiatric: She has a normal mood and affect. Her behavior is normal. Judgment and thought content normal.  Nursing note and vitals reviewed.      Assessment/Plan: 72 year old female with a history of transverse colon cancer status post colectomy.  She had complications dated to her colostomy and I had to take her back and did a completion right hemicolectomy with an end ileostomy and mucous fistula. Developed an abscess and parastomal hernia.  She still has a parastomal hernia but the abscess has completely resolved. We will proceed with colostomy takedown and parastomal hernia repair.  Procedure discussed with the patient and with the family in detail.  Risk benefit and possible complications including but not limited to: Bleeding, infection, anastomotic leak, chronic pain, prolonged hospitalization and even death.  She understands and wishes to proceed.   [name] Hamman, MD [facility] General Surgeon

## 2018-05-27 NOTE — Patient Instructions (Addendum)
schedule surgery Colostomy takedown with hernia repair  The patient is scheduled for surgery at [facility] with Dr Dahlia Byes on 06/25/18. She will pre admit at the hospital. The patient is aware of date and instructions.

## 2018-05-28 ENCOUNTER — Other Ambulatory Visit: Payer: Self-pay | Admitting: Family Medicine

## 2018-05-28 DIAGNOSIS — M5136 Other intervertebral disc degeneration, lumbar region: Secondary | ICD-10-CM

## 2018-05-29 ENCOUNTER — Telehealth: Payer: Self-pay

## 2018-05-29 ENCOUNTER — Other Ambulatory Visit: Payer: Self-pay | Admitting: Internal Medicine

## 2018-05-29 MED ORDER — HYDROCODONE-ACETAMINOPHEN 7.5-325 MG PO TABS: 1.0000 | ORAL_TABLET | Freq: Four times a day (QID) | ORAL | 0 refills | Status: DC | PRN

## 2018-05-29 NOTE — Telephone Encounter (Signed)
Spoke with patient about pre admit testing. She will pre admit at the hospital on 06/18/18 at 11:15 am. She is aware of date, time, and instructions.

## 2018-06-08 NOTE — Progress Notes (Signed)
[facility] Consultation      Assessment and Plan:  Dyspnea on exertion with COPD.  -Mild dyspnea on exertion, with excess mucus production, predominantly chronic bronchitis phenotype.. - Continue Symbicort to 2 puffs twice daily.  - Continue Mucinex as needed to help with mucus clearance. - We will give flu shot today.   Chronic/allergic rhinitis. - Currently on Nasacort taking it regularly. - Reiterated the importance of using Nasacort regularly, she is asked to take 2 sprays in each nostril every night.  Stage IIIa colon cancer. - Status post radiation and chemotherapy. -CT of the chest 04/30/2018, showed changes of chronic bronchitis, strandy right base atelectasis.,  No further intervention required, will continue to monitor.  Date: 06/08/2018  MRN# [id number] Kimberly [name] 08/25/1946    Kimberly [name] is a 72 y.o. old female seen in consultation for chief complaint of:    Chief Complaint  Patient presents with  . Follow-up    breathing going well, cough with phlem light yellow/clear, inhalers working well. has been using the mucinex, nasal spray, nasoacort    HPI:   The patient is a 72 year old female with a history of stage IIIb colon cancer, as well as COPD.  At last visit patient was asked to use Symbicort twice daily, continue Mucinex to help with mucus clearance.  She was also asked to continue using Nasacort regularly.  She has been using symbicort 2 puffs twice daily, she has continued on mucinex, and nasal spray at night. Her dogs still sleep in her bed.  She has gerd controlled with prilosec.   She was a previous smoker, she last smoked about a year ago, smoked about a ppd. She is retired from a Clinical cytogeneticist.  She last smoked this past year. She was smoking about a ppd.   **CT chest 04/30/2018>> images personally reviewed.  There is scattered apical chronic bronchitic changes, right basilar streaky atelectasis, lungs are otherwise  unremarkable. **chest x-ray 05/21/2017 >> mild hyperinflation suggestive of COPD. **CBC; absolute eosinophil count remains around 200-400.  **Spirometry 02/25/18>> FVC is 88% predicted, FEV1 of 86% predicted ratio 76%.  Overall this test shows normal pulmonary functions.  Review of Systems:  Constitutional: Feels well. Cardiovascular: No chest pain.  Pulmonary: Denies hemoptysis.    The remainder of systems were reviewed and were found to be negative other than what is documented in the HPI.   Physical Examination:   VS: BP 128/84 (BP Location: Left Arm, Patient Position: Sitting, Cuff Size: Normal)   Pulse 83   Wt 163 lb 3.2 oz (74 kg)   SpO2 93%   BMI 27.16 kg/m   General Appearance: No distress  Neuro:without focal findings, mental status, speech normal, alert and oriented HEENT: PERRLA, EOM intact Pulmonary: No wheezing, No rales, decreased air entry bilaterally.  CardiovascularNormal S1,S2.  No m/r/g.  Abdomen: Benign, Soft, non-tender, No masses Renal:  No costovertebral tenderness  GU:  No performed at this time. Endoc: No evident thyromegaly, no signs of acromegaly or Cushing features Skin:   warm, no rashes, no ecchymosis  Extremities: normal, no cyanosis, clubbing.     Medication:    Current Outpatient Medications:  .  budesonide-formoterol (SYMBICORT) 80-4.5 MCG/ACT inhaler, Inhale 2 puffs into the lungs 2 (two) times daily., Disp: , Rfl:  .  Calcium Citrate-Vitamin D (CITRACAL/VITAMIN D PO), Take 1 tablet by mouth daily. , Disp: , Rfl:  .  Cholecalciferol (VITAMIN D PO), Take by mouth., Disp: , Rfl:  .  Cyanocobalamin (VITAMIN B 12 PO), Take 1 tablet by mouth daily. , Disp: , Rfl:  .  diphenhydrAMINE (BENADRYL) 50 MG tablet, Take 1 tablet (50 mg total) by mouth once for 1 dose. Take 50mg  1 hour before contrast study., Disp: 1 tablet, Rfl: 0 .  feeding supplement (BOOST / RESOURCE BREEZE) LIQD, Take 1 Container by mouth 3 (three) times daily between meals., Disp:  30 Container, Rfl: 0 .  gabapentin (NEURONTIN) 400 MG capsule, TAKE 1 CAPSULE TWICE DAILY, Disp: 180 capsule, Rfl: 4 .  HYDROcodone-acetaminophen (NORCO) 7.5-325 MG tablet, Take 1-2 tablets by mouth every 6 (six) hours as needed for moderate pain., Disp: 120 tablet, Rfl: 0 .  ibuprofen (ADVIL,MOTRIN) 200 MG tablet, Take 400 mg by mouth daily as needed for headache or moderate pain., Disp: , Rfl:  .  LORazepam (ATIVAN) 0.5 MG tablet, Take 1 tablet (0.5 mg total) by mouth every 6 (six) hours as needed (Nausea or vomiting)., Disp: 30 tablet, Rfl: 0 .  meloxicam (MOBIC) 15 MG tablet, TAKE 1 TABLET EVERY DAY AS NEEDED, Disp: 90 tablet, Rfl: 4 .  montelukast (SINGULAIR) 10 MG tablet, TAKE 1 TABLET BY MOUTH EVERY DAY, Disp: 90 tablet, Rfl: 1 .  omeprazole (PRILOSEC) 20 MG capsule, TAKE 1 CAPSULE EVERY DAY, Disp: 90 capsule, Rfl: 4 .  pravastatin (PRAVACHOL) 40 MG tablet, TAKE 1 TABLET EVERY DAY, Disp: 90 tablet, Rfl: 3 .  Probiotic Product (PROBIOTIC DAILY PO), Take 1 capsule by mouth daily. , Disp: , Rfl:  .  pyridOXINE (VITAMIN B-6) 50 MG tablet, Take 1 tablet (50 mg total) by mouth daily., Disp: 30 tablet, Rfl: 3 .  Triamcinolone Acetonide (NASACORT ALLERGY 24HR NA), Place 1 spray into the nose daily as needed (allergies)., Disp: , Rfl:  No current facility-administered medications for this visit.   Facility-Administered Medications Ordered in Other Visits:  .  0.9 %  sodium chloride infusion, , Intravenous, Continuous, Earlie Server, MD, Stopped at 07/22/17 1150 .  heparin lock flush 100 unit/mL, 500 Units, Intravenous, Once, Earlie Server, MD .  heparin lock flush 100 unit/mL, 500 Units, Intravenous, Once, Earlie Server, MD .  heparin lock flush 100 unit/mL, 500 Units, Intravenous, Once, Earlie Server, MD .  sodium chloride flush (NS) 0.9 % injection 10 mL, 10 mL, Intracatheter, PRN, Earlie Server, MD .  sodium chloride flush (NS) 0.9 % injection 10 mL, 10 mL, Intravenous, PRN, Earlie Server, MD, 10 mL at 05/29/17  1347   Allergies:  Contrast media [iodinated diagnostic agents]; Betadine [povidone iodine]; Other; and Sinus formula [cholestatin]      LABORATORY PANEL:   CBC No results for input(s): WBC, HGB, HCT, PLT in the last 168 hours. ------------------------------------------------------------------------------------------------------------------  Chemistries  No results for input(s): NA, K, CL, CO2, GLUCOSE, BUN, CREATININE, CALCIUM, MG, AST, ALT, ALKPHOS, BILITOT in the last 168 hours.  Invalid input(s): GFRCGP ------------------------------------------------------------------------------------------------------------------  Cardiac Enzymes No results for input(s): TROPONINI in the last 168 hours. ------------------------------------------------------------  RADIOLOGY:  No results found.     Thank  you for the consultation and for allowing [facility] to assist in the care of your patient. Our recommendations are noted above.  Please contact us if we can be of further service.  Marda Stalker, M.D., F.C.C.P.  Board Certified in Internal Medicine, Pulmonary Medicine, [place], and Sleep Medicine.  [facility] Office Number: [phone]  06/08/2018

## 2018-06-09 ENCOUNTER — Encounter: Payer: Self-pay | Admitting: Internal Medicine

## 2018-06-09 ENCOUNTER — Ambulatory Visit (INDEPENDENT_AMBULATORY_CARE_PROVIDER_SITE_OTHER): Payer: Medicare HMO | Admitting: Internal Medicine

## 2018-06-09 VITALS — BP 128/84 | HR 83 | Wt 163.2 lb

## 2018-06-09 DIAGNOSIS — R05 Cough: Secondary | ICD-10-CM | POA: Diagnosis not present

## 2018-06-09 DIAGNOSIS — Z23 Encounter for immunization: Secondary | ICD-10-CM

## 2018-06-09 DIAGNOSIS — J4489 Other specified chronic obstructive pulmonary disease: Secondary | ICD-10-CM

## 2018-06-09 DIAGNOSIS — R059 Cough, unspecified: Secondary | ICD-10-CM

## 2018-06-09 DIAGNOSIS — J449 Chronic obstructive pulmonary disease, unspecified: Secondary | ICD-10-CM

## 2018-06-09 MED ORDER — MONTELUKAST SODIUM 10 MG PO TABS: 10.0000 mg | ORAL_TABLET | Freq: Every day | ORAL | 1 refills | Status: DC

## 2018-06-09 MED ORDER — BUDESONIDE-FORMOTEROL FUMARATE 80-4.5 MCG/ACT IN AERO: 2.0000 | INHALATION_SPRAY | Freq: Two times a day (BID) | RESPIRATORY_TRACT | 3 refills | Status: DC

## 2018-06-09 NOTE — Addendum Note (Signed)
Addended by: Stephanie Coup on: 06/09/2018 11:03 AM   Modules accepted: Orders

## 2018-06-09 NOTE — Patient Instructions (Addendum)
Will give flu shot today.  Continue symbicort, nasal spray.  Will refer to lung cancer

## 2018-06-10 ENCOUNTER — Telehealth: Payer: Self-pay | Admitting: *Deleted

## 2018-06-10 NOTE — Telephone Encounter (Signed)
Received referral for lung screening, contacted patient and reviewed recent CT scan of chest noted. Patient is agreeable to plan of evaluation in one year for consideration of lung screening at that time.

## 2018-06-18 ENCOUNTER — Encounter
Admission: RE | Admit: 2018-06-18 | Discharge: 2018-06-18 | Disposition: A | Discharge status: Home / Self Care | Payer: Medicare HMO | Source: Ambulatory Visit | Attending: Surgery | Admitting: Surgery

## 2018-06-18 ENCOUNTER — Other Ambulatory Visit: Payer: Self-pay

## 2018-06-18 DIAGNOSIS — Z01810 Encounter for preprocedural cardiovascular examination: Secondary | ICD-10-CM | POA: Insufficient documentation

## 2018-06-18 DIAGNOSIS — Z01812 Encounter for preprocedural laboratory examination: Secondary | ICD-10-CM | POA: Diagnosis not present

## 2018-06-18 DIAGNOSIS — J449 Chronic obstructive pulmonary disease, unspecified: Secondary | ICD-10-CM | POA: Diagnosis not present

## 2018-06-18 HISTORY — DX: Chronic obstructive pulmonary disease, unspecified: J44.9

## 2018-06-18 NOTE — Pre-Procedure Instructions (Signed)
PATIENT INSTRUCTED TO CALL DR PABON'S OFFICE TO DETERMINE TO DO FLEETS RECTALLT OR BY STOMA

## 2018-06-18 NOTE — Patient Instructions (Signed)
Your procedure is scheduled on: 06/25/18 Report to Day Surgery. [facility] SECOND FLOOR To find out your arrival time please call [phone] between 1PM - 3PM on 06/24/18  Remember: Instructions that are not followed completely may result in serious medical risk,  up to and including death, or upon the discretion of your surgeon and anesthesiologist your  surgery may need to be rescheduled.     _X__ 1. Do not eat food after midnight the night before your procedure.                 No gum chewing or hard candies. You may drink clear liquids up to 2 hours                 before you are scheduled to arrive for your surgery- DO not drink clear                 liquids within 2 hours of the start of your surgery.                 Clear Liquids include:  water, apple juice without pulp, clear carbohydrate                 drink such as Clearfast of Gatorade, Black Coffee or Tea (Do not add                 anything to coffee or tea).  __X__2.  On the morning of surgery brush your teeth with toothpaste and water, you                may rinse your mouth with mouthwash if you wish.  Do not swallow any toothpaste of mouthwash.     _X__ 3.  No Alcohol for 24 hours before or after surgery.   _X__ 4.  Do Not Smoke or use e-cigarettes For 24 Hours Prior to Your Surgery.                 Do not use any chewable tobacco products for at least 6 hours prior to                 surgery.  ____  5.  Bring all medications with you on the day of surgery if instructed.   ___X_  6.  Notify your doctor if there is any change in your medical condition      (cold, fever, infections).     Do not wear jewelry, make-up, hairpins, clips or nail polish. Do not wear lotions, powders, or perfumes. You may wear deodorant. Do not shave 48 hours prior to surgery. Men may shave face and neck. Do not bring valuables to the hospital.    [facility] is not responsible for any belongings or  valuables.  Contacts, dentures or bridgework may not be worn into surgery. Leave your suitcase in the car. After surgery it may be brought to your room. For patients admitted to the hospital, discharge time is determined by your treatment team.   Patients discharged the day of surgery will not be allowed to drive home.   Please read over the following fact sheets that you were given:   Surgical Site Infection Prevention   _X___ Take these medicines the morning of surgery with A SIP OF WATER:    1. OMEPRAZOLE AT BEDTIME 06/24/18 AND MORNING OF SURGERY  2. GABAPENTIN  3. MONTELUCAST  4.PRAVASTATIN  5.  6.  ____ Fleet Enema (as directed)  __X__ Use CHG Soap as directed  __X__ Use inhalers on the day of surgery    AND BRING TO HOSPITAL  ____ Stop metformin 2 days prior to surgery    ____ Take 1/2 of usual insulin dose the night before surgery. No insulin the morning          of surgery.   ____ Stop Coumadin/Plavix/aspirin on  __X__ Stop Anti-inflammatories on        STOP IBUPROFEN AND MELOXICAM NOW UNTIL AFTER SURGERY      CAN TAKE TYLENOL  ____ Stop supplements until after surgery.    ____ Bring C-Pap to the hospital.

## 2018-06-24 MED ORDER — SODIUM CHLORIDE 0.9 % IV SOLN
1.0000 g | INTRAVENOUS | Status: AC
  Administered 2018-06-25: 1 g via INTRAVENOUS
  Filled 2018-06-24: qty 1

## 2018-06-25 ENCOUNTER — Other Ambulatory Visit: Payer: Self-pay

## 2018-06-25 ENCOUNTER — Inpatient Hospital Stay: Payer: Medicare HMO | Admitting: Anesthesiology

## 2018-06-25 ENCOUNTER — Inpatient Hospital Stay
Admission: RE | Admit: 2018-06-25 | Discharge: 2018-07-13 | DRG: 329 | Disposition: A | Discharge status: Home Health | Payer: Medicare HMO | Source: Ambulatory Visit | Attending: Surgery | Admitting: Surgery

## 2018-06-25 ENCOUNTER — Encounter: Payer: Self-pay | Admitting: *Deleted

## 2018-06-25 ENCOUNTER — Encounter
Admission: RE | Disposition: A | Discharge status: Home Health | Payer: Self-pay | Source: Ambulatory Visit | Attending: Surgery

## 2018-06-25 DIAGNOSIS — K435 Parastomal hernia without obstruction or  gangrene: Secondary | ICD-10-CM | POA: Diagnosis present

## 2018-06-25 DIAGNOSIS — Z800 Family history of malignant neoplasm of digestive organs: Secondary | ICD-10-CM

## 2018-06-25 DIAGNOSIS — G8929 Other chronic pain: Secondary | ICD-10-CM | POA: Diagnosis present

## 2018-06-25 DIAGNOSIS — Z433 Encounter for attention to colostomy: Secondary | ICD-10-CM | POA: Diagnosis not present

## 2018-06-25 DIAGNOSIS — M810 Age-related osteoporosis without current pathological fracture: Secondary | ICD-10-CM | POA: Diagnosis present

## 2018-06-25 DIAGNOSIS — K219 Gastro-esophageal reflux disease without esophagitis: Secondary | ICD-10-CM | POA: Diagnosis present

## 2018-06-25 DIAGNOSIS — Z888 Allergy status to other drugs, medicaments and biological substances status: Secondary | ICD-10-CM | POA: Diagnosis not present

## 2018-06-25 DIAGNOSIS — M79662 Pain in left lower leg: Secondary | ICD-10-CM | POA: Diagnosis not present

## 2018-06-25 DIAGNOSIS — J9601 Acute respiratory failure with hypoxia: Secondary | ICD-10-CM | POA: Diagnosis not present

## 2018-06-25 DIAGNOSIS — Z853 Personal history of malignant neoplasm of breast: Secondary | ICD-10-CM

## 2018-06-25 DIAGNOSIS — J189 Pneumonia, unspecified organism: Secondary | ICD-10-CM | POA: Diagnosis not present

## 2018-06-25 DIAGNOSIS — I82409 Acute embolism and thrombosis of unspecified deep veins of unspecified lower extremity: Secondary | ICD-10-CM

## 2018-06-25 DIAGNOSIS — Z825 Family history of asthma and other chronic lower respiratory diseases: Secondary | ICD-10-CM | POA: Diagnosis not present

## 2018-06-25 DIAGNOSIS — M7989 Other specified soft tissue disorders: Secondary | ICD-10-CM | POA: Diagnosis not present

## 2018-06-25 DIAGNOSIS — D72829 Elevated white blood cell count, unspecified: Secondary | ICD-10-CM | POA: Diagnosis not present

## 2018-06-25 DIAGNOSIS — C184 Malignant neoplasm of transverse colon: Secondary | ICD-10-CM | POA: Diagnosis not present

## 2018-06-25 DIAGNOSIS — Y95 Nosocomial condition: Secondary | ICD-10-CM | POA: Diagnosis not present

## 2018-06-25 DIAGNOSIS — J440 Chronic obstructive pulmonary disease with acute lower respiratory infection: Secondary | ICD-10-CM | POA: Diagnosis not present

## 2018-06-25 DIAGNOSIS — K660 Peritoneal adhesions (postprocedural) (postinfection): Secondary | ICD-10-CM | POA: Diagnosis present

## 2018-06-25 DIAGNOSIS — M79606 Pain in leg, unspecified: Secondary | ICD-10-CM

## 2018-06-25 DIAGNOSIS — S72046A Nondisplaced fracture of base of neck of unspecified femur, initial encounter for closed fracture: Secondary | ICD-10-CM | POA: Diagnosis not present

## 2018-06-25 DIAGNOSIS — Z91041 Radiographic dye allergy status: Secondary | ICD-10-CM

## 2018-06-25 DIAGNOSIS — M5136 Other intervertebral disc degeneration, lumbar region: Secondary | ICD-10-CM | POA: Diagnosis present

## 2018-06-25 DIAGNOSIS — E279 Disorder of adrenal gland, unspecified: Secondary | ICD-10-CM | POA: Diagnosis not present

## 2018-06-25 DIAGNOSIS — R05 Cough: Secondary | ICD-10-CM | POA: Diagnosis not present

## 2018-06-25 DIAGNOSIS — R0989 Other specified symptoms and signs involving the circulatory and respiratory systems: Secondary | ICD-10-CM

## 2018-06-25 DIAGNOSIS — Z9889 Other specified postprocedural states: Secondary | ICD-10-CM

## 2018-06-25 DIAGNOSIS — K651 Peritoneal abscess: Secondary | ICD-10-CM | POA: Diagnosis not present

## 2018-06-25 DIAGNOSIS — K6389 Other specified diseases of intestine: Secondary | ICD-10-CM | POA: Diagnosis not present

## 2018-06-25 DIAGNOSIS — G629 Polyneuropathy, unspecified: Secondary | ICD-10-CM | POA: Diagnosis present

## 2018-06-25 DIAGNOSIS — R140 Abdominal distension (gaseous): Secondary | ICD-10-CM

## 2018-06-25 DIAGNOSIS — Z4659 Encounter for fitting and adjustment of other gastrointestinal appliance and device: Secondary | ICD-10-CM

## 2018-06-25 DIAGNOSIS — J441 Chronic obstructive pulmonary disease with (acute) exacerbation: Secondary | ICD-10-CM | POA: Diagnosis not present

## 2018-06-25 DIAGNOSIS — R0602 Shortness of breath: Secondary | ICD-10-CM | POA: Diagnosis not present

## 2018-06-25 DIAGNOSIS — M79661 Pain in right lower leg: Secondary | ICD-10-CM | POA: Diagnosis not present

## 2018-06-25 DIAGNOSIS — R509 Fever, unspecified: Secondary | ICD-10-CM | POA: Diagnosis not present

## 2018-06-25 DIAGNOSIS — Z4682 Encounter for fitting and adjustment of non-vascular catheter: Secondary | ICD-10-CM | POA: Diagnosis not present

## 2018-06-25 DIAGNOSIS — Z833 Family history of diabetes mellitus: Secondary | ICD-10-CM | POA: Diagnosis not present

## 2018-06-25 DIAGNOSIS — K56609 Unspecified intestinal obstruction, unspecified as to partial versus complete obstruction: Secondary | ICD-10-CM

## 2018-06-25 DIAGNOSIS — K913 Postprocedural intestinal obstruction, unspecified as to partial versus complete: Secondary | ICD-10-CM | POA: Diagnosis not present

## 2018-06-25 DIAGNOSIS — R918 Other nonspecific abnormal finding of lung field: Secondary | ICD-10-CM | POA: Diagnosis not present

## 2018-06-25 DIAGNOSIS — E876 Hypokalemia: Secondary | ICD-10-CM | POA: Diagnosis not present

## 2018-06-25 DIAGNOSIS — Z432 Encounter for attention to ileostomy: Secondary | ICD-10-CM | POA: Diagnosis not present

## 2018-06-25 DIAGNOSIS — M549 Dorsalgia, unspecified: Secondary | ICD-10-CM | POA: Diagnosis present

## 2018-06-25 DIAGNOSIS — Z85038 Personal history of other malignant neoplasm of large intestine: Secondary | ICD-10-CM | POA: Diagnosis not present

## 2018-06-25 DIAGNOSIS — Z8249 Family history of ischemic heart disease and other diseases of the circulatory system: Secondary | ICD-10-CM

## 2018-06-25 DIAGNOSIS — Z87891 Personal history of nicotine dependence: Secondary | ICD-10-CM | POA: Diagnosis not present

## 2018-06-25 DIAGNOSIS — J449 Chronic obstructive pulmonary disease, unspecified: Secondary | ICD-10-CM | POA: Diagnosis not present

## 2018-06-25 DIAGNOSIS — C189 Malignant neoplasm of colon, unspecified: Secondary | ICD-10-CM | POA: Diagnosis not present

## 2018-06-25 DIAGNOSIS — E559 Vitamin D deficiency, unspecified: Secondary | ICD-10-CM | POA: Diagnosis present

## 2018-06-25 DIAGNOSIS — R062 Wheezing: Secondary | ICD-10-CM

## 2018-06-25 DIAGNOSIS — E785 Hyperlipidemia, unspecified: Secondary | ICD-10-CM | POA: Diagnosis present

## 2018-06-25 DIAGNOSIS — D509 Iron deficiency anemia, unspecified: Secondary | ICD-10-CM | POA: Diagnosis present

## 2018-06-25 DIAGNOSIS — J90 Pleural effusion, not elsewhere classified: Secondary | ICD-10-CM | POA: Diagnosis not present

## 2018-06-25 HISTORY — PX: PARASTOMAL HERNIA REPAIR: SHX2162

## 2018-06-25 HISTORY — PX: COLOSTOMY TAKEDOWN: SHX5783

## 2018-06-25 LAB — CREATININE, SERUM: CREATININE: 0.66 mg/dL (ref 0.44–1.00)

## 2018-06-25 LAB — CBC
HCT: 41.5 % (ref 35.0–47.0)
Hemoglobin: 14.2 g/dL (ref 12.0–16.0)
MCH: 33.1 pg (ref 26.0–34.0)
MCHC: 34.2 g/dL (ref 32.0–36.0)
MCV: 96.7 fL (ref 80.0–100.0)
Platelets: 290 K/uL (ref 150–440)
RBC: 4.29 MIL/uL (ref 3.80–5.20)
RDW: 14.5 % (ref 11.5–14.5)
WBC: 18 K/uL — ABNORMAL HIGH (ref 3.6–11.0)

## 2018-06-25 SURGERY — CLOSURE, COLOSTOMY
Anesthesia: General

## 2018-06-25 MED ORDER — DEXAMETHASONE SODIUM PHOSPHATE 10 MG/ML IJ SOLN
INTRAMUSCULAR | Status: AC
  Filled 2018-06-25: qty 1

## 2018-06-25 MED ORDER — LACTATED RINGERS IV SOLN
INTRAVENOUS | Status: DC
  Administered 2018-06-25 – 2018-06-26 (×2): via INTRAVENOUS

## 2018-06-25 MED ORDER — OXYCODONE HCL 5 MG PO TABS
5.0000 mg | ORAL_TABLET | ORAL | Status: DC | PRN
  Administered 2018-06-25: 5 mg via ORAL
  Administered 2018-06-27 – 2018-07-10 (×6): 10 mg via ORAL
  Administered 2018-07-12: 5 mg via ORAL
  Administered 2018-07-12: 10 mg via ORAL
  Filled 2018-06-25 (×3): qty 2
  Filled 2018-06-25: qty 1
  Filled 2018-06-25 (×2): qty 2
  Filled 2018-06-25: qty 1
  Filled 2018-06-25 (×3): qty 2

## 2018-06-25 MED ORDER — MIDAZOLAM HCL 2 MG/2ML IJ SOLN
INTRAMUSCULAR | Status: AC
  Filled 2018-06-25: qty 2

## 2018-06-25 MED ORDER — MOMETASONE FURO-FORMOTEROL FUM 100-5 MCG/ACT IN AERO
2.0000 | INHALATION_SPRAY | Freq: Two times a day (BID) | RESPIRATORY_TRACT | Status: DC
  Administered 2018-06-26 – 2018-07-13 (×33): 2 via RESPIRATORY_TRACT
  Filled 2018-06-25 (×4): qty 8.8

## 2018-06-25 MED ORDER — CHLORHEXIDINE GLUCONATE CLOTH 2 % EX PADS: 6.0000 | MEDICATED_PAD | Freq: Once | CUTANEOUS | Status: DC

## 2018-06-25 MED ORDER — PROCHLORPERAZINE MALEATE 10 MG PO TABS
10.0000 mg | ORAL_TABLET | Freq: Four times a day (QID) | ORAL | Status: DC | PRN
  Filled 2018-06-25: qty 1

## 2018-06-25 MED ORDER — HYDROMORPHONE HCL 1 MG/ML IJ SOLN
INTRAMUSCULAR | Status: AC
  Filled 2018-06-25: qty 1

## 2018-06-25 MED ORDER — ONDANSETRON HCL 4 MG/2ML IJ SOLN
4.0000 mg | Freq: Four times a day (QID) | INTRAMUSCULAR | Status: DC | PRN
  Administered 2018-06-28: 4 mg via INTRAVENOUS
  Filled 2018-06-25: qty 2

## 2018-06-25 MED ORDER — MORPHINE SULFATE (PF) 4 MG/ML IV SOLN
4.0000 mg | INTRAVENOUS | Status: DC | PRN
  Administered 2018-06-29 – 2018-07-09 (×44): 4 mg via INTRAVENOUS
  Filled 2018-06-25 (×43): qty 1

## 2018-06-25 MED ORDER — ONDANSETRON 4 MG PO TBDP: 4.0000 mg | ORAL_TABLET | Freq: Four times a day (QID) | ORAL | Status: DC | PRN

## 2018-06-25 MED ORDER — ENOXAPARIN SODIUM 30 MG/0.3ML ~~LOC~~ SOLN
30.0000 mg | SUBCUTANEOUS | Status: DC
  Administered 2018-06-26 – 2018-06-29 (×4): 30 mg via SUBCUTANEOUS
  Filled 2018-06-25 (×4): qty 0.3

## 2018-06-25 MED ORDER — LACTATED RINGERS IV SOLN
INTRAVENOUS | Status: DC
  Administered 2018-06-25 (×2): via INTRAVENOUS

## 2018-06-25 MED ORDER — FENTANYL CITRATE (PF) 100 MCG/2ML IJ SOLN
25.0000 ug | INTRAMUSCULAR | Status: DC | PRN
  Administered 2018-06-25 (×4): 25 ug via INTRAVENOUS

## 2018-06-25 MED ORDER — ROCURONIUM BROMIDE 50 MG/5ML IV SOLN
INTRAVENOUS | Status: AC
  Filled 2018-06-25: qty 1

## 2018-06-25 MED ORDER — ROCURONIUM BROMIDE 100 MG/10ML IV SOLN
INTRAVENOUS | Status: DC | PRN
  Administered 2018-06-25: 10 mg via INTRAVENOUS
  Administered 2018-06-25 (×2): 20 mg via INTRAVENOUS
  Administered 2018-06-25: 50 mg via INTRAVENOUS

## 2018-06-25 MED ORDER — ONDANSETRON HCL 4 MG/2ML IJ SOLN
INTRAMUSCULAR | Status: DC | PRN
  Administered 2018-06-25: 4 mg via INTRAVENOUS

## 2018-06-25 MED ORDER — ACETAMINOPHEN 500 MG PO TABS
1000.0000 mg | ORAL_TABLET | Freq: Four times a day (QID) | ORAL | Status: DC
  Administered 2018-06-25 – 2018-07-13 (×33): 1000 mg via ORAL
  Filled 2018-06-25 (×36): qty 2

## 2018-06-25 MED ORDER — CYCLOBENZAPRINE HCL 10 MG PO TABS
5.0000 mg | ORAL_TABLET | Freq: Three times a day (TID) | ORAL | Status: DC
  Administered 2018-06-25 – 2018-07-13 (×27): 5 mg via ORAL
  Filled 2018-06-25 (×30): qty 1

## 2018-06-25 MED ORDER — HYDRALAZINE HCL 20 MG/ML IJ SOLN: 10.0000 mg | INTRAMUSCULAR | Status: DC | PRN

## 2018-06-25 MED ORDER — ACETAMINOPHEN 500 MG PO TABS
1000.0000 mg | ORAL_TABLET | ORAL | Status: AC
  Administered 2018-06-25: 1000 mg via ORAL

## 2018-06-25 MED ORDER — TRIAMCINOLONE ACETONIDE 55 MCG/ACT NA AERO
2.0000 | INHALATION_SPRAY | Freq: Every day | NASAL | Status: DC
  Administered 2018-06-26 – 2018-07-13 (×17): 2 via NASAL
  Filled 2018-06-25: qty 21.6

## 2018-06-25 MED ORDER — KETOROLAC TROMETHAMINE 15 MG/ML IJ SOLN
15.0000 mg | Freq: Four times a day (QID) | INTRAMUSCULAR | Status: AC
  Administered 2018-06-25 – 2018-06-30 (×18): 15 mg via INTRAVENOUS
  Filled 2018-06-25 (×19): qty 1

## 2018-06-25 MED ORDER — FENTANYL CITRATE (PF) 100 MCG/2ML IJ SOLN
INTRAMUSCULAR | Status: AC
  Administered 2018-06-25: 25 ug via INTRAVENOUS
  Filled 2018-06-25: qty 2

## 2018-06-25 MED ORDER — SUGAMMADEX SODIUM 200 MG/2ML IV SOLN
INTRAVENOUS | Status: DC | PRN
  Administered 2018-06-25: 150 mg via INTRAVENOUS

## 2018-06-25 MED ORDER — HYDROMORPHONE HCL 1 MG/ML IJ SOLN
INTRAMUSCULAR | Status: DC | PRN
  Administered 2018-06-25: 0.5 mg via INTRAVENOUS

## 2018-06-25 MED ORDER — MONTELUKAST SODIUM 10 MG PO TABS
10.0000 mg | ORAL_TABLET | Freq: Every day | ORAL | Status: DC
  Administered 2018-06-26 – 2018-07-13 (×11): 10 mg via ORAL
  Filled 2018-06-25 (×12): qty 1

## 2018-06-25 MED ORDER — ROCURONIUM BROMIDE 50 MG/5ML IV SOLN
INTRAVENOUS | Status: AC
  Filled 2018-06-25: qty 2

## 2018-06-25 MED ORDER — LIDOCAINE HCL (CARDIAC) PF 100 MG/5ML IV SOSY
PREFILLED_SYRINGE | INTRAVENOUS | Status: DC | PRN
  Administered 2018-06-25: 100 mg via INTRAVENOUS

## 2018-06-25 MED ORDER — BUPIVACAINE-EPINEPHRINE 0.25% -1:200000 IJ SOLN
INTRAMUSCULAR | Status: DC | PRN
  Administered 2018-06-25: 30 mL

## 2018-06-25 MED ORDER — BUPIVACAINE-EPINEPHRINE (PF) 0.25% -1:200000 IJ SOLN
INTRAMUSCULAR | Status: AC
  Filled 2018-06-25: qty 30

## 2018-06-25 MED ORDER — FENTANYL CITRATE (PF) 100 MCG/2ML IJ SOLN
INTRAMUSCULAR | Status: AC
  Filled 2018-06-25: qty 4

## 2018-06-25 MED ORDER — KETOROLAC TROMETHAMINE 30 MG/ML IJ SOLN
INTRAMUSCULAR | Status: DC | PRN
  Administered 2018-06-25: 15 mg via INTRAVENOUS

## 2018-06-25 MED ORDER — FLEET ENEMA 7-19 GM/118ML RE ENEM: 1.0000 | ENEMA | Freq: Once | RECTAL | Status: DC

## 2018-06-25 MED ORDER — FENTANYL CITRATE (PF) 100 MCG/2ML IJ SOLN
INTRAMUSCULAR | Status: DC | PRN
  Administered 2018-06-25 (×3): 50 ug via INTRAVENOUS

## 2018-06-25 MED ORDER — IPRATROPIUM-ALBUTEROL 0.5-2.5 (3) MG/3ML IN SOLN
3.0000 mL | Freq: Once | RESPIRATORY_TRACT | Status: AC
  Administered 2018-06-25: 3 mL via RESPIRATORY_TRACT

## 2018-06-25 MED ORDER — ONDANSETRON HCL 4 MG/2ML IJ SOLN
INTRAMUSCULAR | Status: AC
  Filled 2018-06-25: qty 2

## 2018-06-25 MED ORDER — PROPOFOL 10 MG/ML IV BOLUS
INTRAVENOUS | Status: AC
  Filled 2018-06-25: qty 20

## 2018-06-25 MED ORDER — GABAPENTIN 600 MG PO TABS
600.0000 mg | ORAL_TABLET | Freq: Three times a day (TID) | ORAL | Status: DC
  Administered 2018-06-25 – 2018-07-13 (×25): 600 mg via ORAL
  Filled 2018-06-25 (×30): qty 1

## 2018-06-25 MED ORDER — ACETAMINOPHEN 500 MG PO TABS
ORAL_TABLET | ORAL | Status: AC
  Filled 2018-06-25: qty 2

## 2018-06-25 MED ORDER — BUPIVACAINE LIPOSOME 1.3 % IJ SUSP
INTRAMUSCULAR | Status: AC
  Filled 2018-06-25: qty 20

## 2018-06-25 MED ORDER — SODIUM CHLORIDE 0.9 % IJ SOLN
INTRAMUSCULAR | Status: AC
  Filled 2018-06-25: qty 50

## 2018-06-25 MED ORDER — PROCHLORPERAZINE EDISYLATE 10 MG/2ML IJ SOLN
5.0000 mg | Freq: Four times a day (QID) | INTRAMUSCULAR | Status: DC | PRN
  Filled 2018-06-25: qty 2

## 2018-06-25 MED ORDER — LIDOCAINE HCL (PF) 2 % IJ SOLN
INTRAMUSCULAR | Status: AC
  Filled 2018-06-25: qty 10

## 2018-06-25 MED ORDER — ONDANSETRON HCL 4 MG/2ML IJ SOLN: 4.0000 mg | Freq: Once | INTRAMUSCULAR | Status: DC | PRN

## 2018-06-25 MED ORDER — PROPOFOL 10 MG/ML IV BOLUS
INTRAVENOUS | Status: DC | PRN
  Administered 2018-06-25: 120 mg via INTRAVENOUS

## 2018-06-25 MED ORDER — DEXAMETHASONE SODIUM PHOSPHATE 10 MG/ML IJ SOLN
INTRAMUSCULAR | Status: DC | PRN
  Administered 2018-06-25: 6 mg via INTRAVENOUS

## 2018-06-25 MED ORDER — GABAPENTIN 300 MG PO CAPS: 300.0000 mg | ORAL_CAPSULE | ORAL | Status: DC

## 2018-06-25 MED ORDER — CELECOXIB 200 MG PO CAPS
200.0000 mg | ORAL_CAPSULE | ORAL | Status: AC
  Administered 2018-06-25: 200 mg via ORAL

## 2018-06-25 MED ORDER — CELECOXIB 200 MG PO CAPS
ORAL_CAPSULE | ORAL | Status: AC
  Filled 2018-06-25: qty 1

## 2018-06-25 MED ORDER — SODIUM CHLORIDE 0.9 % IV SOLN
INTRAVENOUS | Status: DC | PRN
  Administered 2018-06-25: 70 mL

## 2018-06-25 MED ORDER — PANTOPRAZOLE SODIUM 40 MG PO TBEC
40.0000 mg | DELAYED_RELEASE_TABLET | Freq: Every day | ORAL | Status: DC
  Administered 2018-06-26 – 2018-07-13 (×11): 40 mg via ORAL
  Filled 2018-06-25 (×12): qty 1

## 2018-06-25 MED ORDER — IPRATROPIUM-ALBUTEROL 0.5-2.5 (3) MG/3ML IN SOLN
RESPIRATORY_TRACT | Status: AC
  Administered 2018-06-25: 3 mL via RESPIRATORY_TRACT
  Filled 2018-06-25: qty 3

## 2018-06-25 MED ORDER — SUGAMMADEX SODIUM 200 MG/2ML IV SOLN
INTRAVENOUS | Status: AC
  Filled 2018-06-25: qty 2

## 2018-06-25 SURGICAL SUPPLY — 57 items
APPLIER CLIP 11 MED OPEN (CLIP)
APPLIER CLIP 13 LRG OPEN (CLIP)
BLADE CLIPPER SURG (BLADE) IMPLANT
BRUSH SCRUB EZ  4% CHG (MISCELLANEOUS) ×2
BRUSH SCRUB EZ 4% CHG (MISCELLANEOUS) ×1 IMPLANT
CANISTER SUCT 1200ML W/VALVE (MISCELLANEOUS) ×3 IMPLANT
CATH URET ROBINSON 16FR STRL (CATHETERS) IMPLANT
CHLORAPREP W/TINT 26ML (MISCELLANEOUS) ×3 IMPLANT
CLIP APPLIE 11 MED OPEN (CLIP) IMPLANT
CLIP APPLIE 13 LRG OPEN (CLIP) IMPLANT
DERMABOND ADVANCED (GAUZE/BANDAGES/DRESSINGS) ×2
DERMABOND ADVANCED .7 DNX12 (GAUZE/BANDAGES/DRESSINGS) ×1 IMPLANT
DRAPE LAPAROTOMY 100X77 ABD (DRAPES) ×3 IMPLANT
DRAPE UNDER BUTTOCK W/FLU (DRAPES) ×3 IMPLANT
DRSG OPSITE POSTOP 4X6 (GAUZE/BANDAGES/DRESSINGS) ×3 IMPLANT
DRSG TELFA 3X8 NADH (GAUZE/BANDAGES/DRESSINGS) ×3 IMPLANT
DRSG TELFA 4X3 1S NADH ST (GAUZE/BANDAGES/DRESSINGS) ×3 IMPLANT
ELECT BLADE 6.5 EXT (BLADE) ×3 IMPLANT
ELECT CAUTERY BLADE 6.4 (BLADE) ×3 IMPLANT
ELECT REM PT RETURN 9FT ADLT (ELECTROSURGICAL) ×3
ELECTRODE REM PT RTRN 9FT ADLT (ELECTROSURGICAL) ×1 IMPLANT
GAUZE SPONGE 4X4 12PLY STRL (GAUZE/BANDAGES/DRESSINGS) ×3 IMPLANT
GAUZE STRETCH 2X75IN STRL (MISCELLANEOUS) ×6 IMPLANT
GLOVE BIO SURGEON STRL SZ7 (GLOVE) ×6 IMPLANT
GLOVE INDICATOR 7.5 STRL GRN (GLOVE) ×3 IMPLANT
GOWN STRL REUS W/ TWL LRG LVL3 (GOWN DISPOSABLE) ×4 IMPLANT
GOWN STRL REUS W/TWL LRG LVL3 (GOWN DISPOSABLE) ×8
HANDLE SUCTION POOLE (INSTRUMENTS) IMPLANT
KIT TURNOVER KIT A (KITS) ×3 IMPLANT
LABEL OR SOLS (LABEL) IMPLANT
LIGASURE IMPACT 36 18CM CVD LR (INSTRUMENTS) ×3 IMPLANT
MESH PHASIX ST 10X15 (Mesh General) ×3 IMPLANT
NEEDLE HYPO 22GX1.5 SAFETY (NEEDLE) ×3 IMPLANT
NEEDLE HYPO 25X1 1.5 SAFETY (NEEDLE) ×6 IMPLANT
NS IRRIG 1000ML POUR BTL (IV SOLUTION) ×3 IMPLANT
NS IRRIG 500ML POUR BTL (IV SOLUTION) ×3 IMPLANT
PACK BASIN MAJOR ARMC (MISCELLANEOUS) ×3 IMPLANT
PACK BASIN MINOR ARMC (MISCELLANEOUS) ×3 IMPLANT
PACK COLON CLEAN CLOSURE (MISCELLANEOUS) ×3 IMPLANT
PLEDGET CV PTFE 7X3 (MISCELLANEOUS) IMPLANT
RELOAD PROXIMATE 75MM BLUE (ENDOMECHANICALS) ×6 IMPLANT
SPONGE LAP 18X18 RF (DISPOSABLE) ×3 IMPLANT
STAPLER PROXIMATE 75MM BLUE (STAPLE) ×3 IMPLANT
STAPLER SKIN PROX 35W (STAPLE) ×3 IMPLANT
SUCTION POOLE HANDLE (INSTRUMENTS)
SUT CHROMIC 4 0 RB 1X27 (SUTURE) ×3 IMPLANT
SUT CHROMIC BR 1/2CLE 2-0 54IN (SUTURE) ×3 IMPLANT
SUT PDS PLUS 0 (SUTURE) ×8
SUT PDS PLUS AB 0 CT-2 (SUTURE) ×4 IMPLANT
SUT SILK 2 0 SH CR/8 (SUTURE) ×3 IMPLANT
SUT SURGILON 0 BLK (SUTURE) ×3 IMPLANT
SUT VIC AB 3-0 SH 27 (SUTURE) ×2
SUT VIC AB 3-0 SH 27X BRD (SUTURE) ×1 IMPLANT
SYR 10ML LL (SYRINGE) ×6 IMPLANT
SYR 20CC LL (SYRINGE) ×6 IMPLANT
SYRINGE IRR TOOMEY STRL 70CC (SYRINGE) ×3 IMPLANT
TRAY FOLEY MTR SLVR 16FR STAT (SET/KITS/TRAYS/PACK) ×3 IMPLANT

## 2018-06-25 NOTE — Progress Notes (Signed)
   06/25/18 1300  Clinical Encounter Type  Visited With Patient and family together  Visit Type Initial;Spiritual support  Recommendations Follow-up, as requested.  Spiritual Encounters  Spiritual Needs Emotional;Prayer  Stress Factors  Patient Stress Factors  (Nervous)   At the request of the patient's daughter, Chaplain met with the patient and family. Patient was nervous, but confident in the outcome. Chaplain offered emotional support, prayer, and a ministry of laughter. Patient relaxed and asked that I follow up with her daughter (also a patient at [facility]).

## 2018-06-25 NOTE — Anesthesia Post-op Follow-up Note (Signed)
Anesthesia QCDR form completed.        

## 2018-06-25 NOTE — Anesthesia Preprocedure Evaluation (Signed)
Anesthesia Evaluation  Patient identified by MRN, date of birth, ID band Patient awake    Reviewed: Allergy & Precautions, NPO status , Patient's Chart, lab work & pertinent test results  History of Anesthesia Complications Negative for: history of anesthetic complications  Airway Mallampati: II       Dental   Pulmonary neg sleep apnea, COPD,  COPD inhaler, former smoker,           Cardiovascular (-) hypertension(-) Past MI and (-) CHF (-) dysrhythmias (-) Valvular Problems/Murmurs     Neuro/Psych neg Seizures    GI/Hepatic Neg liver ROS, GERD  Medicated,  Endo/Other  neg diabetes  Renal/GU negative Renal ROS     Musculoskeletal   Abdominal   Peds  Hematology  (+) anemia ,   Anesthesia Other Findings   Reproductive/Obstetrics                             Anesthesia Physical Anesthesia Plan  ASA: III  Anesthesia Plan: General   Post-op Pain Management:    Induction: Intravenous  PONV Risk Score and Plan: 3 and Ondansetron, Dexamethasone and Midazolam  Airway Management Planned: Oral ETT  Additional Equipment:   Intra-op Plan:   Post-operative Plan:   Informed Consent: I have reviewed the patients History and Physical, chart, labs and discussed the procedure including the risks, benefits and alternatives for the proposed anesthesia with the patient or authorized representative who has indicated his/her understanding and acceptance.     Plan Discussed with:   Anesthesia Plan Comments:         Anesthesia Quick Evaluation

## 2018-06-25 NOTE — Anesthesia Procedure Notes (Signed)
Procedure Name: Intubation Date/Time: 06/25/2018 2:50 PM Performed by: Philbert Riser, CRNA Pre-anesthesia Checklist: Patient identified, Emergency Drugs available, Suction available, Patient being monitored and Timeout performed Patient Re-evaluated:Patient Re-evaluated prior to induction Oxygen Delivery Method: Circle system utilized and Simple face mask Preoxygenation: Pre-oxygenation with 100% oxygen Induction Type: IV induction Ventilation: Mask ventilation without difficulty Laryngoscope Size: Mac and 3 Grade View: Grade I Tube type: Oral Tube size: 7.0 mm Airway Equipment and Method: Stylet Placement Confirmation: ETT inserted through vocal cords under direct vision,  positive ETCO2 and breath sounds checked- equal and bilateral Secured at: 22 cm Tube secured with: Tape Dental Injury: Teeth and Oropharynx as per pre-operative assessment

## 2018-06-25 NOTE — Transfer of Care (Signed)
Immediate Anesthesia Transfer of Care Note  Patient: Kimberly [name]  Procedure(s) Performed: COLOSTOMY TAKEDOWN (N/A ) HERNIA REPAIR PARASTOMAL (N/A )  Patient Location: PACU  Anesthesia Type:General  Level of Consciousness: sedated  Airway & Oxygen Therapy: Patient Spontanous Breathing and Patient connected to face mask oxygen  Post-op Assessment: Report given to RN and Post -op Vital signs reviewed and stable  Post vital signs: Reviewed and stable  Last Vitals:  Vitals Value Taken Time  BP 143/77 06/25/2018  5:54 PM  Temp    Pulse 82 06/25/2018  5:58 PM  Resp 15 06/25/2018  5:58 PM  SpO2 92 % 06/25/2018  5:58 PM  Vitals shown include unvalidated device data.  Last Pain:  Vitals:   06/25/18 1149  TempSrc: Tympanic  PainSc: 0-No pain         Complications: No apparent anesthesia complications

## 2018-06-25 NOTE — Op Note (Signed)
PROCEDURES: 1. Colostomy takedown with ileocolostomy 2. Takedown of splenic flexure 3. Parastomal Hernia repair using 10x15cms Phasix ST mesh  Pre-operative Diagnosis: 1. 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 Parastomal hernia  Post-operative Diagnosis: Same  Surgeon: Marjory Lies Marchello Rothgeb   Assistants: Dr. Genevive Bi ( required due to the complexity of the case, for exposure and anastomosis)  Anesthesia: General endotracheal anesthesia  ASA Class: 2, 3   Surgeon: [name] Hamman , MD FACS  Anesthesia: Gen. with endotracheal tube   Findings: Tension free anastomosis with good perfusion Reducible parastomal hernia   Estimated Blood Loss: 150cc         Drains: none       Complications: none              Condition: stable  Procedure Details  The patient was seen again in the Holding Room. The benefits, complications, treatment options, and expected outcomes were discussed with the patient. The risks of bleeding, infection, recurrence of symptoms, failure to resolve symptoms,  bowel injury, any of which could require further surgery were reviewed with the patient.   The patient was taken to Operating Room, identified as Kimberly [name] and the procedure verified.  A Time Out was held and the above information confirmed.  Prior to the induction of general anesthesia, antibiotic prophylaxis was administered. VTE prophylaxis was in place. General endotracheal anesthesia was then administered and tolerated well. After the induction, the ostomies were closed with a 2-0 silk suture, the abdomen was prepped with Chloraprep and draped in the sterile fashion. The patient was positioned in the supine position.  Incision was performed first on the mucous fistula and the bowel edge was identified and dissected free.  We entered the abdominal cavity after dividing the junction between the fascia and the mucous fistula.  We were able to dissect the  mucous fistula circumferentially.  Attention then was turned to the gastrocolic ligament and will able to take it down and were able to take down the splenic flexure using electrocautery.  We were very careful not to injure any of the bowel.  Were also able to take down some significant adhesions from the omentum to the abdominal wall.  Once we have adequate mobilization of the mucous fistula we concentrated on the end ileostomy.  Elliptical incision was created incorporating the end ileostomy and electrocautery was used to dissect through the cutaneous tissue.  The hernia was encountered and the sac was opened.  We Were able to dissect the end ileostomy from adjacent structures and divided and excised the sac.  The edge of the fascia was identified as well. To need to lyse adhesions from the small bowel to the abdominal wall with Metzenbaum scissors.  I were able to mobilize the end ileostomy.  Able to actually bring the mucous fistula piece towards the previous right lower quadrant ileostomy site.  We created at side-to-side functional end-to-end anastomosis with a 75 stapler device.  Anastomosis was tension-free and with good perfusion.  The Ends of the ostomies were resected and sent for permanent pathology. Attention was then turned to the right lower quadrant defect were a extraperitoneal plane was developed using electrocautery.  Were able to use the retrorectus space and laterally go underneath the inguinal ligament.  After adequate dissection of the extraperitoneal space the posterior layer of the rectus was closed with running 2-0 PDS suture.  This time a 10 x 15 cm ST Phasix mesh was  placed into the extraperitoneal space.  We were able to close the rest of the defect in a 2 layer fashion with interrupted 0 Ethibond sutures fixing the mesh to the abdominal musculature at the same time. Subtenons tissue was closed with running 3-0 PDS.  Attention then was turned to the mucous fistula defect where the  defect was closed in a 2 layer fashion with 0 PDS suture.  The subtenons tissue was closed with 3-0 Vicryl.  Please note that the skin was approximated with loose staples the wounds were irrigated with normal saline.  Liposomal Marcaine was injected on all incision sites under direct visualization.  Needle and laparotomy count were correct and there were no immediate complications  [name] Hamman, MD, FACS

## 2018-06-25 NOTE — Interval H&P Note (Signed)
History and Physical Interval Note:  06/25/2018 12:00 PM  Kimberly [name]  has presented today for surgery, with the diagnosis of COLON CANCER  The various methods of treatment have been discussed with the patient and family. After consideration of risks, benefits and other options for treatment, the patient has consented to  Procedure(s): COLOSTOMY TAKEDOWN (N/A) HERNIA REPAIR PARASTOMAL (N/A) as a surgical intervention .  The patient's history has been reviewed, patient examined, no change in status, stable for surgery.  I have reviewed the patient's chart and labs.  Questions were answered to the patient's satisfaction.     Sloan

## 2018-06-26 ENCOUNTER — Encounter: Payer: Self-pay | Admitting: Surgery

## 2018-06-26 LAB — CBC
HEMATOCRIT: 38.8 % (ref 35.0–47.0)
Hemoglobin: 13.3 g/dL (ref 12.0–16.0)
MCH: 32.8 pg (ref 26.0–34.0)
MCHC: 34.4 g/dL (ref 32.0–36.0)
MCV: 95.5 fL (ref 80.0–100.0)
PLATELETS: 285 10*3/uL (ref 150–440)
RBC: 4.06 MIL/uL (ref 3.80–5.20)
RDW: 14.2 % (ref 11.5–14.5)
WBC: 22.6 10*3/uL — AB (ref 3.6–11.0)

## 2018-06-26 LAB — COMPREHENSIVE METABOLIC PANEL
ALT: 12 U/L (ref 0–44)
ANION GAP: 9 (ref 5–15)
AST: 19 U/L (ref 15–41)
Albumin: 3.3 g/dL — ABNORMAL LOW (ref 3.5–5.0)
Alkaline Phosphatase: 56 U/L (ref 38–126)
BUN: 10 mg/dL (ref 8–23)
CALCIUM: 8.2 mg/dL — AB (ref 8.9–10.3)
CHLORIDE: 103 mmol/L (ref 98–111)
CO2: 27 mmol/L (ref 22–32)
Creatinine, Ser: 0.78 mg/dL (ref 0.44–1.00)
Glucose, Bld: 131 mg/dL — ABNORMAL HIGH (ref 70–99)
Potassium: 4.1 mmol/L (ref 3.5–5.1)
SODIUM: 139 mmol/L (ref 135–145)
Total Bilirubin: 0.7 mg/dL (ref 0.3–1.2)
Total Protein: 6.7 g/dL (ref 6.5–8.1)

## 2018-06-26 LAB — PHOSPHORUS: PHOSPHORUS: 4.5 mg/dL (ref 2.5–4.6)

## 2018-06-26 LAB — MAGNESIUM: Magnesium: 1.8 mg/dL (ref 1.7–2.4)

## 2018-06-26 NOTE — Progress Notes (Addendum)
[facility] Progress Note  1 Day Post-Op  Subjective: She is sitting up in bed this morning. She does note a frontal headache since she woke up from surgery. Pain medicines are helping some. In addition, she endorses abdominal soreness but no nausea or emesis. She has not tried eating yet today. No flatus. Has not tried mobilizing.  Objective: Vital signs in last 24 hours: Temp:  [98.3 F (36.8 C)-99.3 F (37.4 C)] 98.7 F (37.1 C) (09/27 0419) Pulse Rate:  [83-93] 87 (09/27 0419) Resp:  [10-22] 20 (09/27 0419) BP: (132-150)/(76-93) 133/76 (09/27 0419) SpO2:  [88 %-96 %] 92 % (09/27 0419) Weight:  [74.1 kg-76.7 kg] 76.7 kg (09/26 2056) Last BM Date: 06/25/18  Intake/Output from previous day: 09/26 0701 - 09/27 0700 In: 1981.4 [I.V.:1981.4] Out: 1025 [Urine:875; Blood:150] Intake/Output this shift: Total I/O In: 451.2 [I.V.:451.2] Out: -   PE: Gen:  Alert, NAD, pleasant Pulm:  Normal effort Abd: Soft, tenderness around midline incision, non-distended, incisions C/D/I, honeycomb dressing in place with minimal blood tinged saturation Skin: warm and dry, no rashes  Psych: A&Ox3   Lab Results:  Recent Labs    06/25/18 2024 06/26/18 0452  WBC 18.0* 22.6*  HGB 14.2 13.3  HCT 41.5 38.8  PLT 290 285   BMET Recent Labs    06/25/18 2024 06/26/18 0452  NA  --  139  K  --  4.1  CL  --  103  CO2  --  27  GLUCOSE  --  131*  BUN  --  10  CREATININE 0.66 0.78  CALCIUM  --  8.2*   PT/INR No results for input(s): LABPROT, INR in the last 72 hours. CMP     Component Value Date/Time   NA 139 06/26/2018 0452   NA 141 01/02/2017 1410   NA 136 01/27/2012 0637   K 4.1 06/26/2018 0452   K 4.3 01/27/2012 0637   CL 103 06/26/2018 0452   CL 102 01/27/2012 0637   CO2 27 06/26/2018 0452   CO2 25 01/27/2012 0637   GLUCOSE 131 (H) 06/26/2018 0452   GLUCOSE 119 (H) 01/27/2012 0637   BUN 10 06/26/2018 0452   BUN 14 01/02/2017 1410   BUN 15 01/27/2012 0637    CREATININE 0.78 06/26/2018 0452   CREATININE 0.67 01/27/2012 0637   CALCIUM 8.2 (L) 06/26/2018 0452   CALCIUM 7.7 (L) 01/27/2012 0637   PROT 6.7 06/26/2018 0452   PROT 7.0 01/02/2017 1410   PROT 7.6 01/26/2012 0021   ALBUMIN 3.3 (L) 06/26/2018 0452   ALBUMIN 3.9 01/02/2017 1410   ALBUMIN 3.5 01/26/2012 0021   AST 19 06/26/2018 0452   AST 17 01/26/2012 0021   ALT 12 06/26/2018 0452   ALT 19 01/26/2012 0021   ALKPHOS 56 06/26/2018 0452   ALKPHOS 87 01/26/2012 0021   BILITOT 0.7 06/26/2018 0452   BILITOT <0.2 01/02/2017 1410   BILITOT 0.2 01/26/2012 0021   GFRNONAA >60 06/26/2018 0452   GFRNONAA >60 01/27/2012 0637   GFRAA >60 06/26/2018 0452   GFRAA >60 01/27/2012 0637   Lipase     Component Value Date/Time   LIPASE 25 05/03/2017 1247       Studies/Results: No results found.  Anti-infectives: Anti-infectives (From admission, onward)   Start     Dose/Rate Route Frequency Ordered Stop   06/25/18 0600  ertapenem (INVANZ) 1,000 mg in sodium chloride 0.9 % 100 mL IVPB     1 g 200 mL/hr over 30 Minutes Intravenous  On call to O.R. 06/24/18 2208 06/25/18 1448       Assessment/Plan  S/P ileostomy Takedown - POD1, doing well this morning, VSS, afebrile, abdominal soreness. Continue to monitor and serial abdominal examinations - Will keep on clears this morning, consider advancing tomorrow if she does well - D/C Foley catheter.  - Leukocytosis to 22 this morning, suspect this is more likely reaction from surgery rather than infectious etiology given VSS and afebrile no evidence of wound infection, will trend CBC - Continue IVF - Encouraged mobilization as tolerated - Encourage IS - DVT Prophylaxis     LOS: 1 day    Edison Simon , PA-C [facility] 06/26/2018, 9:12 AM [phone] M-F: 7am - 4pm

## 2018-06-26 NOTE — Progress Notes (Signed)
   06/26/18 1058  Clinical Encounter Type  Visited With Patient  Visit Type Follow-up;Spiritual support  Recommendations Follow-up, as requested.  Spiritual Encounters  Spiritual Needs Emotional;Prayer   Patient is pleased with the successful surgical outcome and hopes to continue to improve. Patient spoke about her daughter, also a patient on 2C. Chaplain provided pastoral presence and emotional support as patient began to become sleepy. Chaplain prayed with the patient and concluded the visit.

## 2018-06-27 ENCOUNTER — Inpatient Hospital Stay: Payer: Medicare HMO

## 2018-06-27 LAB — CBC
HEMATOCRIT: 35.7 % (ref 35.0–47.0)
Hemoglobin: 12.3 g/dL (ref 12.0–16.0)
MCH: 33.2 pg (ref 26.0–34.0)
MCHC: 34.6 g/dL (ref 32.0–36.0)
MCV: 95.8 fL (ref 80.0–100.0)
Platelets: 250 10*3/uL (ref 150–440)
RBC: 3.72 MIL/uL — AB (ref 3.80–5.20)
RDW: 14.1 % (ref 11.5–14.5)
WBC: 18.1 10*3/uL — AB (ref 3.6–11.0)

## 2018-06-27 LAB — HIV ANTIBODY (ROUTINE TESTING W REFLEX): HIV Screen 4th Generation wRfx: NONREACTIVE

## 2018-06-27 MED ORDER — DEXTROMETHORPHAN POLISTIREX ER 30 MG/5ML PO SUER
30.0000 mg | Freq: Two times a day (BID) | ORAL | Status: DC
  Administered 2018-06-27 – 2018-07-13 (×22): 30 mg via ORAL
  Filled 2018-06-27 (×37): qty 5

## 2018-06-27 MED ORDER — VANCOMYCIN HCL IN DEXTROSE 1-5 GM/200ML-% IV SOLN
1000.0000 mg | Freq: Two times a day (BID) | INTRAVENOUS | Status: DC
  Administered 2018-06-28: 1000 mg via INTRAVENOUS
  Filled 2018-06-27 (×4): qty 200

## 2018-06-27 MED ORDER — ALBUTEROL SULFATE (2.5 MG/3ML) 0.083% IN NEBU
2.5000 mg | INHALATION_SOLUTION | RESPIRATORY_TRACT | Status: DC | PRN
  Administered 2018-06-28: 2.5 mg via RESPIRATORY_TRACT
  Filled 2018-06-27: qty 3

## 2018-06-27 MED ORDER — SODIUM CHLORIDE 0.9 % IV SOLN
1.0000 g | Freq: Three times a day (TID) | INTRAVENOUS | Status: AC
  Administered 2018-06-28 – 2018-07-05 (×24): 1 g via INTRAVENOUS
  Filled 2018-06-27 (×24): qty 1

## 2018-06-27 MED ORDER — GUAIFENESIN ER 600 MG PO TB12
600.0000 mg | ORAL_TABLET | Freq: Two times a day (BID) | ORAL | Status: DC
  Administered 2018-06-27 – 2018-07-13 (×16): 600 mg via ORAL
  Filled 2018-06-27 (×19): qty 1

## 2018-06-27 MED ORDER — DM-GUAIFENESIN ER 30-600 MG PO TB12: 1.0000 | ORAL_TABLET | Freq: Two times a day (BID) | ORAL | Status: DC | PRN

## 2018-06-27 MED ORDER — IPRATROPIUM-ALBUTEROL 0.5-2.5 (3) MG/3ML IN SOLN
3.0000 mL | Freq: Three times a day (TID) | RESPIRATORY_TRACT | Status: DC
  Administered 2018-06-27 – 2018-06-28 (×3): 3 mL via RESPIRATORY_TRACT
  Filled 2018-06-27 (×2): qty 3

## 2018-06-27 MED ORDER — VANCOMYCIN HCL IN DEXTROSE 1-5 GM/200ML-% IV SOLN
1000.0000 mg | Freq: Once | INTRAVENOUS | Status: AC
  Administered 2018-06-27: 1000 mg via INTRAVENOUS
  Filled 2018-06-27: qty 200

## 2018-06-27 NOTE — Progress Notes (Addendum)
Subjective:  CC:  Kimberly [name] is a 72 y.o. female  Hospital stay day 2, 2 Days Post-Op colostomy takedown and parastomal hernia repair  HPI: Called by RN for wheezing and SOB.  Xray shows patchy inflitrates and patient continues of some congestion. Tolerated clears but denies any gas or BM.  ROS:  A 5 point review of systems was performed and pertinent positives and negatives noted in HPI.   Objective:      Temp:  [97.8 F (36.6 C)-98.8 F (37.1 C)] 97.8 F (36.6 C) (09/28 2107) Pulse Rate:  [84-103] 84 (09/28 2107) Resp:  [16-20] 20 (09/28 2107) BP: (103-126)/(54-71) 103/54 (09/28 2107) SpO2:  [91 %-93 %] 93 % (09/28 2107)     Height: 5\' 4"  (162.6 cm) Weight: 76.7 kg BMI (Calculated): 29.01   Intake/Output this shift:  Total I/O In: -  Out: 700 [Urine:700]       Constitutional :  alert, cooperative, appears stated age and no distress  Respiratory:  clear to auscultation bilaterally  Cardiovascular:  regular rate and rhythm  Gastrointestinal: RLQ and left sided incision dressing intact, with appropriate tenderness to palpation but is hypertympanic with visible distention..   Skin: Cool and moist.   Psychiatric: Normal affect, non-agitated, not confused       LABS:  CMP Latest Ref Rng & Units 06/26/2018 06/25/2018 05/22/2018  Glucose 70 - 99 mg/dL 131(H) - 88  BUN 8 - 23 mg/dL 10 - 14  Creatinine 0.44 - 1.00 mg/dL 0.78 0.66 0.76  Sodium 135 - 145 mmol/L 139 - 135  Potassium 3.5 - 5.1 mmol/L 4.1 - 4.3  Chloride 98 - 111 mmol/L 103 - 102  CO2 22 - 32 mmol/L 27 - 25  Calcium 8.9 - 10.3 mg/dL 8.2(L) - 8.8(L)  Total Protein 6.5 - 8.1 g/dL 6.7 - 7.2  Total Bilirubin 0.3 - 1.2 mg/dL 0.7 - 0.7  Alkaline Phos 38 - 126 U/L 56 - 80  AST 15 - 41 U/L 19 - 17  ALT 0 - 44 U/L 12 - 11   CBC Latest Ref Rng & Units 06/27/2018 06/26/2018 06/25/2018  WBC 3.6 - 11.0 K/uL 18.1(H) 22.6(H) 18.0(H)  Hemoglobin 12.0 - 16.0 g/dL 12.3 13.3 14.2  Hematocrit 35.0 - 47.0 % 35.7 38.8 41.5   Platelets 150 - 440 K/uL 250 285 290    RADS:  Assessment:   S/p colostomy takedown and parastomal hernia repair.  toelrating CLD but seems distended on my exam.  RN states she is at baseline since the surgery yesterday.  Will obtain KUB to assess bowel distention.  HCAP- patient coughing and CXR shows possible pneumonia. WBC improving but still at 18.  Lungs clear to auscultation but will start abx empirically.

## 2018-06-27 NOTE — Progress Notes (Signed)
Pharmacy Antibiotic Note  Kimberly [name] is a 72 y.o. female admitted on 06/25/2018 with pneumonia.  Pharmacy has been consulted for vancomycin dosing. Patient received vanc 1g IV x 1  Plan: Will continue w/ vanc 1g IV q12h w/ 6 hour stack  Will draw trough prior to 4th dose.  Ke 0.0588 T1/2 12 hrs Goal trough 15 - 20 mcg/mL  Height: 5\' 4"  (162.6 cm) Weight: 169 lb 1.5 oz (76.7 kg) IBW/kg (Calculated) : 54.7  Temp (24hrs), Avg:98.4 F (36.9 C), Min:97.8 F (36.6 C), Max:98.8 F (37.1 C)  Recent Labs  Lab 06/25/18 2024 06/26/18 0452 06/27/18 0525  WBC 18.0* 22.6* 18.1*  CREATININE 0.66 0.78  --     Estimated Creatinine Clearance: 65.6 mL/min (by C-G formula based on SCr of 0.78 mg/dL).    Allergies  Allergen Reactions  . Contrast Media [Iodinated Diagnostic Agents]     Skin rash  . Betadine [Povidone Iodine] Other (See Comments)    Topical iodine she states "if you put it in an open sore it will cause a eating ulcer."  . Other Nausea And Vomiting    Antihystamines  . Sinus Formula [Cholestatin] Nausea Only    Thank you for allowing pharmacy to be a part of this patient's care.  Tobie Lords, PharmD, BCPS Clinical Pharmacist 06/27/2018

## 2018-06-28 ENCOUNTER — Inpatient Hospital Stay: Payer: Medicare HMO

## 2018-06-28 LAB — EXPECTORATED SPUTUM ASSESSMENT W GRAM STAIN, RFLX TO RESP C

## 2018-06-28 LAB — STREP PNEUMONIAE URINARY ANTIGEN: Strep Pneumo Urinary Antigen: NEGATIVE

## 2018-06-28 LAB — MRSA PCR SCREENING: MRSA BY PCR: NEGATIVE

## 2018-06-28 LAB — EXPECTORATED SPUTUM ASSESSMENT W REFEX TO RESP CULTURE

## 2018-06-28 MED ORDER — IPRATROPIUM-ALBUTEROL 0.5-2.5 (3) MG/3ML IN SOLN
3.0000 mL | Freq: Four times a day (QID) | RESPIRATORY_TRACT | Status: DC
  Administered 2018-06-28 – 2018-06-29 (×5): 3 mL via RESPIRATORY_TRACT
  Filled 2018-06-28 (×5): qty 3

## 2018-06-28 NOTE — Consult Note (Signed)
Patient Demographics  Kimberly [name], is a 72 y.o. female   MRN: [id number]   DOB - 18-May-1946  Admit Date - 06/25/2018    Outpatient Primary MD for the patient is Caryn Section Kirstie Peri, MD  Consult requested in the Hospital by Jules Husbands, MD, On 06/28/2018    Reason for consult ; pneumonia  HPI: 72 year old female patient admitted to surgical service for colostomy, parastomal hernia repair, patient had surgery on September 26, we are consulted for her shortness of breath getting worse requiring more oxygen, chest x-ray concerning for pneumonia.  Patient denies any chest pain now has shortness of breath, dry cough, but patient is on 2.5 L of oxygen and saturation 91%.  Chest x-ray concerning for bibasilar atelectasis.  Patient has no fever, started on IV antibiotics cefepime, vancomycin.  Patient is using incentive spirometry.  Has history of COPD, previous smoker.  Never on oxygen at home.  Main complaint today is abdominal pain.   Past Medical History:  Diagnosis Date  . Breast cancer, left (HCC)    Lumpectomy and rad tx's.  . Chronic back pain   . Colon cancer ([place])   . COPD (chronic obstructive pulmonary disease) ([place])   . Degenerative disc disease, lumbar 04/2013  . Dyspnea   . Family history of adverse reaction to anesthesia    son arrested after anesthesia  . GERD (gastroesophageal reflux disease)   . Hyperlipidemia   . Iron deficiency anemia 05/27/2017  . Neuropathy   . Osteoarthritis    of knee  . Osteoporosis   . Tobacco abuse   . Vitamin D deficiency       Past Surgical History:  Procedure Laterality Date  . APPENDECTOMY  1965  . BREAST EXCISIONAL BIOPSY Left 08/01/2003   lumpectomy rad 11/04-2/28/2005  . BREAST SURGERY    . CESAREAN SECTION     x3  . COLON SURGERY    . COLONOSCOPY W/ POLYPECTOMY    .  COLONOSCOPY WITH PROPOFOL N/A 04/09/2018   Procedure: COLONOSCOPY WITH PROPOFOL;  Surgeon: Virgel Manifold, MD;  Location: [facility];  Service: Gastroenterology;  Laterality: N/A;  . COLOSTOMY TAKEDOWN N/A 06/25/2018   Procedure: COLOSTOMY TAKEDOWN;  Surgeon: Jules Husbands, MD;  Location: [facility];  Service: General;  Laterality: N/A;  . ESOPHAGOGASTRODUODENOSCOPY (EGD) WITH PROPOFOL N/A 04/04/2017   Procedure: ESOPHAGOGASTRODUODENOSCOPY (EGD) WITH PROPOFOL;  Surgeon: [name], MD;  Location: [place];  Service: Endoscopy;  Laterality: N/A;  . FRACTURE SURGERY    . HIP FRACTURE SURGERY Left 01/25/2012  . JOINT REPLACEMENT    . LAPAROTOMY N/A 04/15/2017   Procedure: EXPLORATORY LAPAROTOMY;  Surgeon: Clayburn Pert, MD;  Location: [facility];  Service: General;  Laterality: N/A;  . NECK SURGERY  12/2011  . PARASTOMAL HERNIA REPAIR N/A 12/03/2017   Procedure: HERNIA REPAIR PARASTOMAL;  Surgeon: Jules Husbands, MD;  Location: [facility];  Service: General;  Laterality: N/A;  . PARASTOMAL HERNIA REPAIR N/A 06/25/2018   Procedure: HERNIA REPAIR PARASTOMAL;  Surgeon: Jules Husbands, MD;  Location: [facility];  Service: General;  Laterality: N/A;  . PORTACATH PLACEMENT Right 05/21/2017   Procedure: INSERTION PORT-A-CATH;  Surgeon: Clayburn Pert, MD;  Location: [facility];  Service: General;  Laterality: Right;  . WRIST FRACTURE SURGERY           Review of Systems    In addition to the HPI above, No Fever-chills, No Headache, No changes with Vision or hearing, No problems swallowing food or Liquids, No Chest pain, Cough or Shortness of Breath, Has abdominal pain. , No Blood in stool or Urine, No dysuria, No new skin rashes or bruises, No new joints pains-aches,  No new weakness, tingling, numbness in any extremity, No recent weight gain or loss, No polyuria, polydypsia or polyphagia, No significant Mental Stressors.  A full 10 point Review of Systems was done, except as  stated above, all other Review of Systems were negative.   Social History Social History   Tobacco Use  . Smoking status: Former Smoker    Packs/day: 0.25    Years: 55.00    Pack years: 13.75    Types: Cigarettes    Last attempt to quit: 05/21/2017    Years since quitting: 1.1  . Smokeless tobacco: Never Used  . Tobacco comment: started age 74 1/3 to 1 ppd  Substance Use Topics  . Alcohol use: Yes    Alcohol/week: 0.0 standard drinks    Comment: occasionally drinks beer     Family History Family History  Problem Relation Age of Onset  . Diabetes Mother        type 2  . Coronary artery disease Mother   . Deep vein thrombosis Mother   . Colon cancer Father   . Asthma Brother   . Diabetes Brother        type 2     Prior to Admission medications   Medication Sig Start Date End Date Taking? Authorizing Provider  budesonide-formoterol (SYMBICORT) 80-4.5 MCG/ACT inhaler Inhale 2 puffs into the lungs 2 (two) times daily. 06/09/18  Yes Laverle Hobby, MD  cholecalciferol (VITAMIN D) 1000 units tablet Take 1,000 Units by mouth daily.   Yes [provider]  feeding supplement (BOOST / RESOURCE BREEZE) LIQD Take 1 Container by mouth 3 (three) times daily between meals. 04/19/17  Yes Fritzi Mandes, MD  gabapentin (NEURONTIN) 400 MG capsule TAKE 1 CAPSULE TWICE DAILY Patient taking differently: Take 400 mg by mouth 2 (two) times daily.  09/28/17  Yes Birdie Sons, MD  HYDROcodone-acetaminophen (NORCO) 7.5-325 MG tablet Take 1-2 tablets by mouth every 6 (six) hours as needed for moderate pain. 05/29/18  Yes Birdie Sons, MD  meloxicam (MOBIC) 15 MG tablet TAKE 1 TABLET EVERY DAY AS NEEDED Patient taking differently: Take 15 mg by mouth daily as needed for pain.  08/07/17  Yes Birdie Sons, MD  montelukast (SINGULAIR) 10 MG tablet Take 1 tablet (10 mg total) by mouth daily. 06/09/18  Yes Laverle Hobby, MD  omeprazole (PRILOSEC) 20 MG capsule TAKE 1 CAPSULE  EVERY DAY Patient taking differently: Take 20 mg by mouth daily.  02/10/18  Yes Birdie Sons, MD  pravastatin (PRAVACHOL) 40 MG tablet TAKE 1 TABLET EVERY DAY Patient taking differently: Take 40 mg by mouth daily.  04/27/18  Yes Birdie Sons, MD  Probiotic Product (PROBIOTIC DAILY PO) Take 1 capsule by mouth daily.    Yes [provider]  pyridOXINE (VITAMIN B-6) 50 MG tablet Take 1  tablet (50 mg total) by mouth daily. 06/24/17  Yes Earlie Server, MD  Triamcinolone Acetonide (NASACORT ALLERGY 24HR NA) Place 2 sprays into the nose daily.    Yes [provider]  vitamin B-12 (CYANOCOBALAMIN) 1000 MCG tablet Take 1,000 mcg by mouth daily.   Yes [provider]  diphenhydrAMINE (BENADRYL) 50 MG tablet Take 1 tablet (50 mg total) by mouth once for 1 dose. Take 50mg  1 hour before contrast study. 04/13/18 04/13/18  Earlie Server, MD  ibuprofen (ADVIL,MOTRIN) 200 MG tablet Take 400 mg by mouth daily as needed for headache or moderate pain.    [provider]    Anti-infectives (From admission, onward)   Start     Dose/Rate Route Frequency Ordered Stop   06/28/18 0600  vancomycin (VANCOCIN) IVPB 1000 mg/200 mL premix     1,000 mg 200 mL/hr over 60 Minutes Intravenous Every 12 hours 06/27/18 2358     06/27/18 2245  vancomycin (VANCOCIN) IVPB 1000 mg/200 mL premix     1,000 mg 200 mL/hr over 60 Minutes Intravenous  Once 06/27/18 2243 06/28/18 0049   06/27/18 2215  ceFEPIme (MAXIPIME) 1 g in sodium chloride 0.9 % 100 mL IVPB     1 g 200 mL/hr over 30 Minutes Intravenous Every 8 hours 06/27/18 2207 07/05/18 2159   06/25/18 0600  ertapenem (INVANZ) 1,000 mg in sodium chloride 0.9 % 100 mL IVPB     1 g 200 mL/hr over 30 Minutes Intravenous On call to O.R. 06/24/18 2208 06/25/18 1448      Scheduled Meds: . acetaminophen  1,000 mg Oral Q6H  . cyclobenzaprine  5 mg Oral TID  . guaiFENesin  600 mg Oral BID   And  . dextromethorphan  30 mg Oral BID  . enoxaparin (LOVENOX)  injection  30 mg Subcutaneous Q24H  . gabapentin  600 mg Oral TID  . ipratropium-albuterol  3 mL Nebulization TID  . ketorolac  15 mg Intravenous Q6H  . mometasone-formoterol  2 puff Inhalation BID  . montelukast  10 mg Oral Daily  . pantoprazole  40 mg Oral Daily  . triamcinolone  2 spray Nasal Daily   Continuous Infusions: . ceFEPime (MAXIPIME) IV 1 g (06/28/18 0625)  . vancomycin Stopped (06/28/18 1100)   PRN Meds:.albuterol, hydrALAZINE, morphine injection, ondansetron **OR** ondansetron (ZOFRAN) IV, oxyCODONE, prochlorperazine **OR** prochlorperazine  Allergies  Allergen Reactions  . Contrast Media [Iodinated Diagnostic Agents]     Skin rash  . Betadine [Povidone Iodine] Other (See Comments)    Topical iodine she states "if you put it in an open sore it will cause a eating ulcer."  . Other Nausea And Vomiting    Antihystamines  . Sinus Formula [Cholestatin] Nausea Only    Physical Exam  Vitals  Blood pressure 121/60, pulse 80, temperature 98.7 F (37.1 C), temperature source Oral, resp. rate 18, height 5\' 4"  (1.626 m), weight 76.7 kg, SpO2 91 %.   1. General ; alert, awake, oriented  2. Normal affect and insight, Not Suicidal or Homicidal, Awake Alert, Oriented X 3.  3. No F.N deficits, ALL C.Nerves Intact, Strength 5/5 all 4 extremities, Sensation intact all 4 extremities, Plantars down going.  4. Ears and Eyes appear Normal, Conjunctivae clear, PERRLA. Moist Oral Mucosa.  5. Supple Neck, No JVD, No cervical lymphadenopathy appriciated, No Carotid Bruits.  6. Symmetrical Chest wall movement, Good air movement bilaterally, CTAB.  7. RRR, No Gallops, Rubs or Murmurs, No Parasternal Heave.  8.  Abdominal dressing  present, diminished breath sounds.  9.  No Cyanosis, Normal Skin Turgor, No Skin Rash or Bruise.  10. Good muscle tone,  joints appear normal , no effusions, Normal ROM.  11. No Palpable Lymph Nodes in Neck or Axillae    Data Review  CBC Recent  Labs  Lab 06/25/18 2024 06/26/18 0452 06/27/18 0525  WBC 18.0* 22.6* 18.1*  HGB 14.2 13.3 12.3  HCT 41.5 38.8 35.7  PLT 290 285 250  MCV 96.7 95.5 95.8  MCH 33.1 32.8 33.2  MCHC 34.2 34.4 34.6  RDW 14.5 14.2 14.1   ------------------------------------------------------------------------------------------------------------------  Chemistries  Recent Labs  Lab 06/25/18 2024 06/26/18 0452  NA  --  139  K  --  4.1  CL  --  103  CO2  --  27  GLUCOSE  --  131*  BUN  --  10  CREATININE 0.66 0.78  CALCIUM  --  8.2*  MG  --  1.8  AST  --  19  ALT  --  12  ALKPHOS  --  56  BILITOT  --  0.7   ------------------------------------------------------------------------------------------------------------------ estimated creatinine clearance is 65.6 mL/min (by C-G formula based on SCr of 0.78 mg/dL). ------------------------------------------------------------------------------------------------------------------ No results for input(s): TSH, T4TOTAL, T3FREE, THYROIDAB in the last 72 hours.  Invalid input(s): FREET3   Coagulation profile No results for input(s): INR, PROTIME in the last 168 hours. ------------------------------------------------------------------------------------------------------------------- No results for input(s): DDIMER in the last 72 hours. -------------------------------------------------------------------------------------------------------------------  Cardiac Enzymes No results for input(s): CKMB, TROPONINI, MYOGLOBIN in the last 168 hours.  Invalid input(s): CK ------------------------------------------------------------------------------------------------------------------ Invalid input(s): POCBNP   ---------------------------------------------------------------------------------------------------------------  Urinalysis    Component Value Date/Time   COLORURINE AMBER (A) 05/03/2017 1247   APPEARANCEUR CLEAR (A) 05/03/2017 1247    APPEARANCEUR Hazy 01/26/2012 0108   LABSPEC 1.003 (L) 05/03/2017 1247   LABSPEC 1.021 01/26/2012 0108   PHURINE 6.0 05/03/2017 1247   GLUCOSEU NEGATIVE 05/03/2017 1247   GLUCOSEU Negative 01/26/2012 0108   HGBUR NEGATIVE 05/03/2017 1247   BILIRUBINUR NEGATIVE 05/03/2017 1247   BILIRUBINUR Negative 01/26/2012 0108   KETONESUR NEGATIVE 05/03/2017 1247   PROTEINUR NEGATIVE 05/03/2017 1247   NITRITE POSITIVE (A) 05/03/2017 1247   LEUKOCYTESUR NEGATIVE 05/03/2017 1247   LEUKOCYTESUR Negative 01/26/2012 0108     Imaging results:   Dg Chest Port 1 View  Result Date: 06/28/2018 CLINICAL DATA:  Cough EXAM: PORTABLE CHEST 1 VIEW COMPARISON:  06/27/2018 FINDINGS: Cardiomegaly. Normal vascularity. Bibasilar airspace opacities. Upper lungs clear. No pneumothorax. Low lung volumes. IMPRESSION: Stable bibasilar airspace opacities. Electronically Signed   By: Marybelle Killings M.D.   On: 06/28/2018 10:23   Dg Chest Port 1 View  Result Date: 06/27/2018 CLINICAL DATA:  Wheezing EXAM: PORTABLE CHEST 1 VIEW COMPARISON:  Chest radiograph December 02, 2017 and chest CT May 20, 2018 FINDINGS: There is patchy consolidation in both lower lobes with small left pleural effusion. The lungs elsewhere are clear. Heart is borderline enlarged with pulmonary vascularity normal. No adenopathy. There is aortic atherosclerosis. No bone lesions. IMPRESSION: Areas of patchy infiltrate in the bases, felt to represent foci of pneumonia. Small left pleural effusion. Borderline cardiac prominence. There is aortic atherosclerosis. Aortic Atherosclerosis (ICD10-I70.0). Followup PA and lateral chest radiographs recommended in 3-4 weeks following trial of antibiotic therapy to ensure resolution and exclude underlying malignancy. Electronically Signed   By: Lowella Grip III M.D.   On: 06/27/2018 08:32   Dg Abd Portable 1v  Result Date: 06/27/2018 CLINICAL DATA:  Abdominal distension. Status  post colostomy takedown June 25, 2018.  EXAM: PORTABLE ABDOMEN - 1 VIEW COMPARISON:  CT abdomen and pelvis May 20, 2018 FINDINGS: Multiple loops of gas distended small bowel measuring to 4.7 cm. Paucity of large bowel gas. Small amount of residual enteric contrast. Calcification versus metallic foreign body LEFT pelvis. No intra-abdominal mass effect. Scattered skin staples. IMPRESSION: Gas distended small bowel, given history of colostomy this could reflect small-bowel obstruction or small-bowel ileus. Electronically Signed   By: Elon Alas M.D.   On: 06/27/2018 22:59      Assessment & Plan  Active Problems:   Parastomal hernia without obstruction or gangrene   S/P colostomy takedown    62.  72 year old female patient with parastomal hernia, history of transverse colon cancer status post colectomy, hernia repair by surgery on the 26th: Continue postoperative care as per surgery recommendation patient is n.p.o. at this time. 2.  Acute respiratory failure with hypoxia secondary to basilar pneumonia got aggravated by recent surgery, and underlying COPD: Continue oxygen, continue bronchodilators, continue antibiotics, continue incentive spirometry as she is doing now.  Okay to give a small dose of Lasix. Sputum  Cultures were done but results are pending. 3.  COPD without exacerbation.  Add bronchodilators. #4 DVT, GI prophylaxis  AM Labs Ordered, also please review Full Orders  Family Communication: Plan discussed with patient and patient's daughter.  Time spent on this consult is 50 minutes Thank you for the consult, we will follow the patient with you in the Hospital.   Epifanio Lesches M.D on 06/28/2018 at 12:00 PM  Note: This dictation was prepared with Dragon dictation along with smaller phrase technology. Any transcriptional errors that result from this process are unintentional.

## 2018-06-28 NOTE — Progress Notes (Signed)
Subjective:  CC:  Kimberly [name] is a 72 y.o. female  Hospital stay day 3, 3 Days Post-Op colostomy takedown and parastomal hernia repair  HPI: No specific issues overnight.  States she is tolerating diet but endorses hiccups and some distention.  No flatus or BM yet.  ROS:  A 5 point review of systems was performed and pertinent positives and negatives noted in HPI.   Objective:      Temp:  [97.8 F (36.6 C)-98.7 F (37.1 C)] 98.7 F (37.1 C) (09/29 0641) Pulse Rate:  [78-101] 80 (09/29 0713) Resp:  [18-20] 18 (09/29 0713) BP: (103-121)/(54-60) 121/60 (09/29 0641) SpO2:  [91 %-93 %] 91 % (09/29 0713)     Height: 5\' 4"  (162.6 cm) Weight: 76.7 kg BMI (Calculated): 29.01   Intake/Output this shift:  Total I/O In: 480 [P.O.:480] Out: -        Constitutional :  alert, cooperative, appears stated age and no distress  Respiratory:  clear to auscultation bilaterally  Cardiovascular:  regular rate and rhythm  Gastrointestinal: RLQ and left sided incision dressing intact, with appropriate tenderness to palpation but is hypertympanic with visible distention..   Skin: Cool and moist.   Psychiatric: Normal affect, non-agitated, not confused       LABS:  CMP Latest Ref Rng & Units 06/26/2018 06/25/2018 05/22/2018  Glucose 70 - 99 mg/dL 131(H) - 88  BUN 8 - 23 mg/dL 10 - 14  Creatinine 0.44 - 1.00 mg/dL 0.78 0.66 0.76  Sodium 135 - 145 mmol/L 139 - 135  Potassium 3.5 - 5.1 mmol/L 4.1 - 4.3  Chloride 98 - 111 mmol/L 103 - 102  CO2 22 - 32 mmol/L 27 - 25  Calcium 8.9 - 10.3 mg/dL 8.2(L) - 8.8(L)  Total Protein 6.5 - 8.1 g/dL 6.7 - 7.2  Total Bilirubin 0.3 - 1.2 mg/dL 0.7 - 0.7  Alkaline Phos 38 - 126 U/L 56 - 80  AST 15 - 41 U/L 19 - 17  ALT 0 - 44 U/L 12 - 11   CBC Latest Ref Rng & Units 06/27/2018 06/26/2018 06/25/2018  WBC 3.6 - 11.0 K/uL 18.1(H) 22.6(H) 18.0(H)  Hemoglobin 12.0 - 16.0 g/dL 12.3 13.3 14.2  Hematocrit 35.0 - 47.0 % 35.7 38.8 41.5  Platelets 150 - 440 K/uL 250  285 290    RADS: CLINICAL DATA:  Cough  EXAM: PORTABLE CHEST 1 VIEW  COMPARISON:  06/27/2018  FINDINGS: Cardiomegaly. Normal vascularity. Bibasilar airspace opacities. Upper lungs clear. No pneumothorax. Low lung volumes.  IMPRESSION: Stable bibasilar airspace opacities.   Electronically Signed   By: Marybelle Killings M.D.   On: 06/28/2018 10:23 Assessment:   S/p colostomy takedown and parastomal hernia repair.  toelrating CLD but continued distention and now endorsing hiccups with KUB showing dilated loops.  Will switch back to NPO for now before any nausea/vomiting.  HCAP- patient coughing and CXR shows possible pneumonia. Per Dr. Dahlia Byes, hospitalist consult placed.  Appreciate recs.

## 2018-06-28 NOTE — Plan of Care (Signed)
Remains NPO per protocol,no complaints voiced at present,will continue to monitor and follow planned regimen.[place] within reach.

## 2018-06-28 NOTE — Progress Notes (Signed)
Spoke with Dr. Lysle Pearl re: request for Lasix to be ordered per RT's request/recommendation.

## 2018-06-29 LAB — BASIC METABOLIC PANEL
Anion gap: 10 (ref 5–15)
BUN: 14 mg/dL (ref 8–23)
CALCIUM: 8.1 mg/dL — AB (ref 8.9–10.3)
CO2: 27 mmol/L (ref 22–32)
CREATININE: 0.61 mg/dL (ref 0.44–1.00)
Chloride: 102 mmol/L (ref 98–111)
GFR calc Af Amer: >60 mL/min (ref >60)
Glucose, Bld: 99 mg/dL (ref 70–99)
Potassium: 3.4 mmol/L — ABNORMAL LOW (ref 3.5–5.1)
Sodium: 139 mmol/L (ref 135–145)

## 2018-06-29 LAB — CBC
HCT: 35 % (ref 35.0–47.0)
Hemoglobin: 11.8 g/dL — ABNORMAL LOW (ref 12.0–16.0)
MCH: 33 pg (ref 26.0–34.0)
MCHC: 33.6 g/dL (ref 32.0–36.0)
MCV: 98.2 fL (ref 80.0–100.0)
PLATELETS: 273 10*3/uL (ref 150–440)
RBC: 3.56 MIL/uL — AB (ref 3.80–5.20)
RDW: 13.9 % (ref 11.5–14.5)
WBC: 14.1 10*3/uL — ABNORMAL HIGH (ref 3.6–11.0)

## 2018-06-29 LAB — SURGICAL PATHOLOGY

## 2018-06-29 LAB — HEPARIN LEVEL (UNFRACTIONATED): Heparin Unfractionated: <0.1 IU/mL — ABNORMAL LOW (ref 0.30–0.70)

## 2018-06-29 LAB — APTT: aPTT: 43 seconds — ABNORMAL HIGH (ref 24–36)

## 2018-06-29 MED ORDER — POTASSIUM CHLORIDE CRYS ER 20 MEQ PO TBCR
20.0000 meq | EXTENDED_RELEASE_TABLET | Freq: Two times a day (BID) | ORAL | Status: AC
  Administered 2018-06-29 (×2): 20 meq via ORAL
  Filled 2018-06-29 (×2): qty 1

## 2018-06-29 MED ORDER — IPRATROPIUM-ALBUTEROL 0.5-2.5 (3) MG/3ML IN SOLN
3.0000 mL | Freq: Three times a day (TID) | RESPIRATORY_TRACT | Status: DC
  Administered 2018-06-30 – 2018-07-11 (×33): 3 mL via RESPIRATORY_TRACT
  Filled 2018-06-29 (×35): qty 3

## 2018-06-29 MED ORDER — LACTATED RINGERS IV SOLN
INTRAVENOUS | Status: DC
  Administered 2018-06-29 – 2018-07-01 (×5): via INTRAVENOUS

## 2018-06-29 MED ORDER — ENOXAPARIN SODIUM 40 MG/0.4ML ~~LOC~~ SOLN
40.0000 mg | SUBCUTANEOUS | Status: DC
  Administered 2018-06-30 – 2018-07-13 (×14): 40 mg via SUBCUTANEOUS
  Filled 2018-06-29 (×14): qty 0.4

## 2018-06-29 NOTE — Progress Notes (Signed)
PHARMACIST - PHYSICIAN COMMUNICATION  CONCERNING:  Enoxaparin (Lovenox) for DVT Prophylaxis    RECOMMENDATION: Patient was prescribed enoxaprin 30mg  q24 hours for VTE prophylaxis.   Filed Weights   06/25/18 1149 06/25/18 2056  Weight: 163 lb 6.4 oz (74.1 kg) 169 lb 1.5 oz (76.7 kg)    Body mass index is 29.02 kg/m.  Estimated Creatinine Clearance: 65.6 mL/min (by C-G formula based on SCr of 0.61 mg/dL).   Based on [place] patient is candidate for enoxaparin 40mg  every 24 hour dosing  DESCRIPTION: Pharmacy has adjusted enoxaparin dose per [place], approved through [place] committee.  Patient is now receiving enoxaparin 40mg  every 24 hours.   Vallery Sa, PharmD Clinical Pharmacist  06/29/2018 9:38 AM

## 2018-06-29 NOTE — Anesthesia Postprocedure Evaluation (Signed)
Anesthesia Post Note  Patient: Kimberly [name]  Procedure(s) Performed: COLOSTOMY TAKEDOWN (N/A ) HERNIA REPAIR PARASTOMAL (N/A )  Patient location during evaluation: PACU Anesthesia Type: General Level of consciousness: awake and alert Pain management: pain level controlled Vital Signs Assessment: post-procedure vital signs reviewed and stable Respiratory status: spontaneous breathing, nonlabored ventilation, respiratory function stable and patient connected to nasal cannula oxygen Cardiovascular status: blood pressure returned to baseline and stable Postop Assessment: no apparent nausea or vomiting Anesthetic complications: no     Last Vitals:  Vitals:   06/29/18 0217 06/29/18 0424  BP:  (!) 115/58  Pulse:  96  Resp:  (!) 24  Temp:  36.7 C  SpO2: 91% 92%    Last Pain:  Vitals:   06/29/18 0424  TempSrc: Oral  PainSc:                  Martha Clan

## 2018-06-29 NOTE — Progress Notes (Signed)
[facility] NAME: Kimberly [name]    MR#:  [id number]  DATE OF BIRTH:  03/03/1946  SUBJECTIVE:  CHIEF COMPLAINT:  No chief complaint on file. Patient without complaint, no events overnight per nursing staff, very little flatus, no bowel movement  REVIEW OF SYSTEMS:  CONSTITUTIONAL: No fever, fatigue or weakness.  EYES: No blurred or double vision.  EARS, NOSE, AND THROAT: No tinnitus or ear pain.  RESPIRATORY: No cough, shortness of breath, wheezing or hemoptysis.  CARDIOVASCULAR: No chest pain, orthopnea, edema.  GASTROINTESTINAL: No nausea, vomiting, diarrhea or abdominal pain.  GENITOURINARY: No dysuria, hematuria.  ENDOCRINE: No polyuria, nocturia,  HEMATOLOGY: No anemia, easy bruising or bleeding SKIN: No rash or lesion. MUSCULOSKELETAL: No joint pain or arthritis.   NEUROLOGIC: No tingling, numbness, weakness.  PSYCHIATRY: No anxiety or depression.   ROS  DRUG ALLERGIES:   Allergies  Allergen Reactions  . Contrast Media [Iodinated Diagnostic Agents]     Skin rash  . Betadine [Povidone Iodine] Other (See Comments)    Topical iodine she states "if you put it in an open sore it will cause a eating ulcer."  . Other Nausea And Vomiting    Antihystamines  . Sinus Formula [Cholestatin] Nausea Only    VITALS:  Blood pressure 130/71, pulse 99, temperature 98.5 F (36.9 C), temperature source Oral, resp. rate 17, height 5\' 4"  (1.626 m), weight 76.7 kg, SpO2 92 %.  PHYSICAL EXAMINATION:  GENERAL:  72 y.o.-year-old patient lying in the bed with no acute distress.  EYES: Pupils equal, round, reactive to light and accommodation. No scleral icterus. Extraocular muscles intact.  HEENT: Head atraumatic, normocephalic. Oropharynx and nasopharynx clear.  NECK:  Supple, no jugular venous distention. No thyroid enlargement, no tenderness.  LUNGS: Normal breath sounds bilaterally, no wheezing, rales,rhonchi or crepitation. No use of  accessory muscles of respiration.  CARDIOVASCULAR: S1, S2 normal. No murmurs, rubs, or gallops.  ABDOMEN: Soft, nontender, nondistended. Bowel sounds present. No organomegaly or mass.  EXTREMITIES: No pedal edema, cyanosis, or clubbing.  NEUROLOGIC: Cranial nerves II through XII are intact. Muscle strength 5/5 in all extremities. Sensation intact. Gait not checked.  PSYCHIATRIC: The patient is alert and oriented x 3.  SKIN: No obvious rash, lesion, or ulcer.   Physical Exam LABORATORY PANEL:   CBC Recent Labs  Lab 06/29/18 0455  WBC 14.1*  HGB 11.8*  HCT 35.0  PLT 273   ------------------------------------------------------------------------------------------------------------------  Chemistries  Recent Labs  Lab 06/26/18 0452 06/29/18 0455  NA 139 139  K 4.1 3.4*  CL 103 102  CO2 27 27  GLUCOSE 131* 99  BUN 10 14  CREATININE 0.78 0.61  CALCIUM 8.2* 8.1*  MG 1.8  --   AST 19  --   ALT 12  --   ALKPHOS 56  --   BILITOT 0.7  --    ------------------------------------------------------------------------------------------------------------------  Cardiac Enzymes No results for input(s): TROPONINI in the last 168 hours. ------------------------------------------------------------------------------------------------------------------  RADIOLOGY:  Dg Chest Port 1 View  Result Date: 06/28/2018 CLINICAL DATA:  Cough EXAM: PORTABLE CHEST 1 VIEW COMPARISON:  06/27/2018 FINDINGS: Cardiomegaly. Normal vascularity. Bibasilar airspace opacities. Upper lungs clear. No pneumothorax. Low lung volumes. IMPRESSION: Stable bibasilar airspace opacities. Electronically Signed   By: Marybelle Killings M.D.   On: 06/28/2018 10:23   Dg Abd Portable 1v  Result Date: 06/27/2018 CLINICAL DATA:  Abdominal distension. Status post colostomy takedown June 25, 2018. EXAM: PORTABLE ABDOMEN - 1 VIEW COMPARISON:  CT abdomen and pelvis May 20, 2018 FINDINGS: Multiple loops of gas distended small  bowel measuring to 4.7 cm. Paucity of large bowel gas. Small amount of residual enteric contrast. Calcification versus metallic foreign body LEFT pelvis. No intra-abdominal mass effect. Scattered skin staples. IMPRESSION: Gas distended small bowel, given history of colostomy this could reflect small-bowel obstruction or small-bowel ileus. Electronically Signed   By: Elon Alas M.D.   On: 06/27/2018 22:59    ASSESSMENT AND PLAN:  *Status post ileostomy takedown Recovering well, awaiting return of bowel functioning  *Acute hospital-acquired pneumonia Continue pneumonia protocol, empiric cefepime/vancomycin follow-up on cultures  *Acute hypoxic respiratory failure Secondary to pneumonia Resolving Continue breathing treatments as needed, supplemental oxygen weaning as tolerated, encourage incentive spirometer while awake    *COPD without exacerbation Stable   All the records are reviewed and case discussed with Care Management/Social Workerr. Management plans discussed with the patient, family and they are in agreement.  CODE STATUS: full  TOTAL TIME TAKING CARE OF THIS PATIENT: 35 minutes.     POSSIBLE D/C IN 1-3 DAYS, DEPENDING ON CLINICAL CONDITION.   Avel Peace Allisa Einspahr M.D on 06/29/2018   Between 7am to 6pm - Pager - [phone]  After 6pm go to [email or web address] - password [email or web address] [facility]  Office  [phone]  CC: Primary care physician; Birdie Sons, MD  Note: This dictation was prepared with Dragon dictation along with smaller phrase technology. Any transcriptional errors that result from this process are unintentional.

## 2018-06-29 NOTE — Care Management Important Message (Signed)
Copy of signed IM left with patient in room.  

## 2018-06-29 NOTE — Progress Notes (Addendum)
[facility] Progress Note  4 Days Post-Op  Subjective: She notes that she has been able to pass some flatus in the last 24 hours however still feels distended. No abdominal pain, nausea, or emesis, fever, or chills. Was tolerating a diet but was made NPO over the weekend given distension and concern for ileus on KUB.   She continues to note a productive cough with brown sputum but no SOB or CP. Does not use O2 at home. Hospitalist on board over weekend to help manage HCAP  Objective: Vital signs in last 24 hours: Temp:  [98.1 F (36.7 C)-99.4 F (37.4 C)] 98.1 F (36.7 C) (09/30 0424) Pulse Rate:  [96-112] 96 (09/30 0424) Resp:  [18-24] 24 (09/30 0424) BP: (105-129)/(55-64) 115/58 (09/30 0424) SpO2:  [90 %-92 %] 92 % (09/30 0424) Last BM Date: 06/24/18  Intake/Output from previous day: 09/29 0701 - 09/30 0700 In: 862.8 [P.O.:480; IV Piggyback:382.8] Out: -  Intake/Output this shift: No intake/output data recorded.  PE: Gen:  Alert, NAD, pleasant HEENT: Nasal Canula Card:  Regular rate and rhythm Pulm:  Diminished bilaterally, end expiratory wheezing in bases Abd: Soft, tenderness near incision sites, mild distension, bowel sounds present in all 4 quadrants, laparotomy incisions C/D/I  Skin: warm and dry, no rashes  Psych: A&Ox3   Lab Results:  Recent Labs    06/27/18 0525 06/29/18 0455  WBC 18.1* 14.1*  HGB 12.3 11.8*  HCT 35.7 35.0  PLT 250 273   BMET Recent Labs    06/29/18 0455  NA 139  K 3.4*  CL 102  CO2 27  GLUCOSE 99  BUN 14  CREATININE 0.61  CALCIUM 8.1*   PT/INR No results for input(s): LABPROT, INR in the last 72 hours. CMP     Component Value Date/Time   NA 139 06/29/2018 0455   NA 141 01/02/2017 1410   NA 136 01/27/2012 0637   K 3.4 (L) 06/29/2018 0455   K 4.3 01/27/2012 0637   CL 102 06/29/2018 0455   CL 102 01/27/2012 0637   CO2 27 06/29/2018 0455   CO2 25 01/27/2012 0637   GLUCOSE 99 06/29/2018 0455   GLUCOSE  119 (H) 01/27/2012 0637   BUN 14 06/29/2018 0455   BUN 14 01/02/2017 1410   BUN 15 01/27/2012 0637   CREATININE 0.61 06/29/2018 0455   CREATININE 0.67 01/27/2012 0637   CALCIUM 8.1 (L) 06/29/2018 0455   CALCIUM 7.7 (L) 01/27/2012 0637   PROT 6.7 06/26/2018 0452   PROT 7.0 01/02/2017 1410   PROT 7.6 01/26/2012 0021   ALBUMIN 3.3 (L) 06/26/2018 0452   ALBUMIN 3.9 01/02/2017 1410   ALBUMIN 3.5 01/26/2012 0021   AST 19 06/26/2018 0452   AST 17 01/26/2012 0021   ALT 12 06/26/2018 0452   ALT 19 01/26/2012 0021   ALKPHOS 56 06/26/2018 0452   ALKPHOS 87 01/26/2012 0021   BILITOT 0.7 06/26/2018 0452   BILITOT <0.2 01/02/2017 1410   BILITOT 0.2 01/26/2012 0021   GFRNONAA >60 06/29/2018 0455   GFRNONAA >60 01/27/2012 0637   GFRAA >60 06/29/2018 0455   GFRAA >60 01/27/2012 0637   Lipase     Component Value Date/Time   LIPASE 25 05/03/2017 1247       Studies/Results: Dg Chest Port 1 View  Result Date: 06/28/2018 CLINICAL DATA:  Cough EXAM: PORTABLE CHEST 1 VIEW COMPARISON:  06/27/2018 FINDINGS: Cardiomegaly. Normal vascularity. Bibasilar airspace opacities. Upper lungs clear. No pneumothorax. Low lung volumes. IMPRESSION: Stable bibasilar airspace  opacities. Electronically Signed   By: Marybelle Killings M.D.   On: 06/28/2018 10:23   Dg Abd Portable 1v  Result Date: 06/27/2018 CLINICAL DATA:  Abdominal distension. Status post colostomy takedown June 25, 2018. EXAM: PORTABLE ABDOMEN - 1 VIEW COMPARISON:  CT abdomen and pelvis May 20, 2018 FINDINGS: Multiple loops of gas distended small bowel measuring to 4.7 cm. Paucity of large bowel gas. Small amount of residual enteric contrast. Calcification versus metallic foreign body LEFT pelvis. No intra-abdominal mass effect. Scattered skin staples. IMPRESSION: Gas distended small bowel, given history of colostomy this could reflect small-bowel obstruction or small-bowel ileus. Electronically Signed   By: Elon Alas M.D.   On:  06/27/2018 22:59    Anti-infectives: Anti-infectives (From admission, onward)   Start     Dose/Rate Route Frequency Ordered Stop   06/28/18 0600  vancomycin (VANCOCIN) IVPB 1000 mg/200 mL premix  Status:  Discontinued     1,000 mg 200 mL/hr over 60 Minutes Intravenous Every 12 hours 06/27/18 2358 06/28/18 1700   06/27/18 2245  vancomycin (VANCOCIN) IVPB 1000 mg/200 mL premix     1,000 mg 200 mL/hr over 60 Minutes Intravenous  Once 06/27/18 2243 06/28/18 1100   06/27/18 2215  ceFEPIme (MAXIPIME) 1 g in sodium chloride 0.9 % 100 mL IVPB     1 g 200 mL/hr over 30 Minutes Intravenous Every 8 hours 06/27/18 2207 07/05/18 2159   06/25/18 0600  ertapenem (INVANZ) 1,000 mg in sodium chloride 0.9 % 100 mL IVPB     1 g 200 mL/hr over 30 Minutes Intravenous On call to O.R. 06/24/18 2208 06/25/18 1448       Assessment/Plan  S/P Ileostomy Takedown  - POD4, she was able to pass some flatus overnight but continues to feel somewhat distended this morning. No nausea or emesis. Continue serial abdominal exams while awaiting bowel function - Will trial her on clear liquids this morning, monitor for nausea or emesis - Will restart IVF this morning as she has been NPO and complains of being thirsty. LR at 50 ml/hr, continue until tolerating diet.  - Leukocytosis improved to 14.1 this morning from 18.0, continue to trend - Hypokalemia this morning, will replace and recheck tomorrow. Should improve as diet advances - Encouraged mobilization  HCAP - Hospitalist following, appreciate their help - Leukocytosis improved - Continue IV ABx, bronchodilators - Encouraged IS use, aggressive pulmonary toilet - Does not use O2 at home, wean as tolerates    LOS: 4 days    Edison Simon , PA-C  [facility] 06/29/2018, 8:30 AM [phone] M-F: 7am - 4pm

## 2018-06-30 ENCOUNTER — Inpatient Hospital Stay: Payer: Medicare HMO

## 2018-06-30 LAB — BASIC METABOLIC PANEL
Anion gap: 8 (ref 5–15)
BUN: 13 mg/dL (ref 8–23)
CALCIUM: 8.3 mg/dL — AB (ref 8.9–10.3)
CHLORIDE: 105 mmol/L (ref 98–111)
CO2: 27 mmol/L (ref 22–32)
CREATININE: 0.58 mg/dL (ref 0.44–1.00)
GFR calc Af Amer: >60 mL/min (ref >60)
GFR calc non Af Amer: >60 mL/min (ref >60)
Glucose, Bld: 117 mg/dL — ABNORMAL HIGH (ref 70–99)
Potassium: 4 mmol/L (ref 3.5–5.1)
SODIUM: 140 mmol/L (ref 135–145)

## 2018-06-30 LAB — CBC
HCT: 32.2 % — ABNORMAL LOW (ref 35.0–47.0)
Hemoglobin: 11.1 g/dL — ABNORMAL LOW (ref 12.0–16.0)
MCH: 33.8 pg (ref 26.0–34.0)
MCHC: 34.5 g/dL (ref 32.0–36.0)
MCV: 97.8 fL (ref 80.0–100.0)
Platelets: 299 10*3/uL (ref 150–440)
RBC: 3.3 MIL/uL — ABNORMAL LOW (ref 3.80–5.20)
RDW: 13.9 % (ref 11.5–14.5)
WBC: 13.2 10*3/uL — ABNORMAL HIGH (ref 3.6–11.0)

## 2018-06-30 MED ORDER — ADULT MULTIVITAMIN W/MINERALS CH
1.0000 | ORAL_TABLET | Freq: Every day | ORAL | Status: DC
  Administered 2018-06-30: 1 via ORAL
  Filled 2018-06-30: qty 1

## 2018-06-30 MED ORDER — BOOST / RESOURCE BREEZE PO LIQD CUSTOM
1.0000 | Freq: Three times a day (TID) | ORAL | Status: DC
  Administered 2018-06-30: 1 via ORAL

## 2018-06-30 NOTE — Progress Notes (Addendum)
[facility] Progress Note  5 Days Post-Op  Subjective: She noted some sharp RLQ pain this morning on her walk but that spontaneously resolved. She endorses flatus overnight, but not bowel movement. She continues to feel distended but denied any further pain, nausea or emesis. She feels that her cough has improved some but remains productive. No fevers. Has been mobilizing well.   Objective: Vital signs in last 24 hours: Temp:  [97.6 F (36.4 C)-98.6 F (37 C)] 98.6 F (37 C) (10/01 0612) Pulse Rate:  [91-99] 91 (10/01 0757) Resp:  [16-20] 18 (10/01 0757) BP: (129-135)/(67-71) 135/67 (10/01 0612) SpO2:  [90 %-92 %] 91 % (10/01 0757) Last BM Date: 06/25/18  Intake/Output from previous day: 09/30 0701 - 10/01 0700 In: 1219.3 [I.V.:813.3; IV Piggyback:406] Out: 150 [Urine:150] Intake/Output this shift: No intake/output data recorded.  PE: Gen:  Alert, NAD, pleasant Pulm:  Normal effort Abd: Soft, mild incisional tenderness, distended, tympanic to percussion, bowel sounds present in all 4 quadrants more prominent in lower abdomen, incisions are well healing, C/D/I, some surrounding ecchymosis but no erythema or drainage.  Skin: warm and dry, no rashes  Psych: A&Ox3   Lab Results:  Recent Labs    06/29/18 0455 06/30/18 0352  WBC 14.1* 13.2*  HGB 11.8* 11.1*  HCT 35.0 32.2*  PLT 273 299   BMET Recent Labs    06/29/18 0455 06/30/18 0352  NA 139 140  K 3.4* 4.0  CL 102 105  CO2 27 27  GLUCOSE 99 117*  BUN 14 13  CREATININE 0.61 0.58  CALCIUM 8.1* 8.3*   PT/INR No results for input(s): LABPROT, INR in the last 72 hours. CMP     Component Value Date/Time   NA 140 06/30/2018 0352   NA 141 01/02/2017 1410   NA 136 01/27/2012 0637   K 4.0 06/30/2018 0352   K 4.3 01/27/2012 0637   CL 105 06/30/2018 0352   CL 102 01/27/2012 0637   CO2 27 06/30/2018 0352   CO2 25 01/27/2012 0637   GLUCOSE 117 (H) 06/30/2018 0352   GLUCOSE 119 (H) 01/27/2012 0637    BUN 13 06/30/2018 0352   BUN 14 01/02/2017 1410   BUN 15 01/27/2012 0637   CREATININE 0.58 06/30/2018 0352   CREATININE 0.67 01/27/2012 0637   CALCIUM 8.3 (L) 06/30/2018 0352   CALCIUM 7.7 (L) 01/27/2012 0637   PROT 6.7 06/26/2018 0452   PROT 7.0 01/02/2017 1410   PROT 7.6 01/26/2012 0021   ALBUMIN 3.3 (L) 06/26/2018 0452   ALBUMIN 3.9 01/02/2017 1410   ALBUMIN 3.5 01/26/2012 0021   AST 19 06/26/2018 0452   AST 17 01/26/2012 0021   ALT 12 06/26/2018 0452   ALT 19 01/26/2012 0021   ALKPHOS 56 06/26/2018 0452   ALKPHOS 87 01/26/2012 0021   BILITOT 0.7 06/26/2018 0452   BILITOT <0.2 01/02/2017 1410   BILITOT 0.2 01/26/2012 0021   GFRNONAA >60 06/30/2018 0352   GFRNONAA >60 01/27/2012 0637   GFRAA >60 06/30/2018 0352   GFRAA >60 01/27/2012 0637   Lipase     Component Value Date/Time   LIPASE 25 05/03/2017 1247       Studies/Results: No results found.  Anti-infectives: Anti-infectives (From admission, onward)   Start     Dose/Rate Route Frequency Ordered Stop   06/28/18 0600  vancomycin (VANCOCIN) IVPB 1000 mg/200 mL premix  Status:  Discontinued     1,000 mg 200 mL/hr over 60 Minutes Intravenous Every 12 hours 06/27/18 2358  06/28/18 1700   06/27/18 2245  vancomycin (VANCOCIN) IVPB 1000 mg/200 mL premix     1,000 mg 200 mL/hr over 60 Minutes Intravenous  Once 06/27/18 2243 06/28/18 1100   06/27/18 2215  ceFEPIme (MAXIPIME) 1 g in sodium chloride 0.9 % 100 mL IVPB     1 g 200 mL/hr over 30 Minutes Intravenous Every 8 hours 06/27/18 2207 07/05/18 2159   06/25/18 0600  ertapenem (INVANZ) 1,000 mg in sodium chloride 0.9 % 100 mL IVPB     1 g 200 mL/hr over 30 Minutes Intravenous On call to O.R. 06/24/18 2208 06/25/18 1448       Assessment/Plan  S/P Ileostomy Takedown  - POD5, she endorses passing more flatus this morning, however he abdomen remains quite distended.Will get KUB and consider placing NGT  - Will continue on clears this morning given continued  distension, may make NPO if dilation on XR - Continue IVF until tolerating diet further, monitor UO - Leukocytosis improved to 13 this morning from 14.1, continue to trend - Hypokalemia resolved after repletion yesterday.  - Encouraged mobilization  HCAP - Cough has improved - Hospitalist following, appreciate their help - Leukocytosis improved - Continue IV ABx, bronchodilators - Encouraged IS use, aggressive pulmonary toilet - Does not use O2 at home, wean as tolerates   LOS: 5 days    Kimberly [name] , PA-C [facility] 06/30/2018, 8:40 AM [phone] M-F: 7am - 4pm

## 2018-06-30 NOTE — Progress Notes (Signed)
Initial Nutrition Assessment  DOCUMENTATION CODES:   Not applicable  INTERVENTION:   If unable to advance diet in the next 1-2 days, recommend TPN as pt without adequate nutrition for 6 days.   Pt likely at moderate refeed risk; recommend monitor K, Mg and P labs   Boost Breeze po TID, each supplement provides 250 kcal and 9 grams of protein  MVI daily   NUTRITION DIAGNOSIS:   Inadequate oral intake related to acute illness as evidenced by other (comment)(pt on NPO/clear liquid diet for 7 days).  GOAL:   Patient will meet greater than or equal to 90% of their needs  MONITOR:   PO intake, Supplement acceptance, Diet advancement, Labs, Weight trends, Skin, I & O's  REASON FOR ASSESSMENT:   NPO/Clear Liquid Diet    ASSESSMENT:   72 y/o female with h/o COPD, partial colectomy with end colostomy and mucous fistula on 04/15/2017. Recent parastomal hernia leading to end ileostomy creation on 12/03/2017, now s/p colostomy takedown with ileocolostomy and parastomal hernia repair 3/08 complicated by post op ileus and HCAP   Met with pt in room today. Pt reports her last solid meal that she ate was on 9/25 prior to being NPO for surgery. Pt reports good appetite and oral intake pta. Pt also reports her weight is stable pta. Pt reports abdominal distension with sharp pains today. Pt advanced to a clear liquid diet yesterday; reports that she does not like the hospital food but her daughter has been bringing her in broth from home. Pt has been tolerating her clear liquids ok; reports mild nausea after eating. Pt initiated on Boost Breeze (likes peach) yesterday and has been drinking these also. Pt has been mobilizing around the nurses station. Per MD note, plan is to obtain KUB and possible NGT placement. If unable to advance pt's diet in the next 1-2 days; would recommend TPN as pt without adequate nutrition for 6 days. Pt likely at moderate refeeding risk; recommend monitor K, Mg and P as diet  advances.    Medications reviewed and include: lovenox, protonix, cefepime, LRS _0 /hr  Labs reviewed: K 4.0 wnl P 4.5 wnl, P 1.8 wnl- 9/27 Wbc- 13.2(H)  NUTRITION - FOCUSED PHYSICAL EXAM:    Most Recent Value  Orbital Region  No depletion  Upper Arm Region  No depletion  Thoracic and Lumbar Region  No depletion  Buccal Region  No depletion  Temple Region  No depletion  Clavicle Bone Region  No depletion  Clavicle and Acromion Bone Region  No depletion  Scapular Bone Region  No depletion  Dorsal Hand  No depletion  Patellar Region  No depletion  Anterior Thigh Region  No depletion  Posterior Calf Region  No depletion  Edema (RD Assessment)  None  Hair  Reviewed  Eyes  Reviewed  Mouth  Reviewed  Skin  Reviewed  Nails  Reviewed     Diet Order:   Diet Order            Diet clear liquid Room service appropriate? Yes; Fluid consistency: Thin  Diet effective now             EDUCATION NEEDS:   Education needs have been addressed  Skin:  Skin Assessment: Reviewed RN Assessment(Incision abdomen )  Last BM:  9/26  Height:   Ht Readings from Last 1 Encounters:  06/25/18 _1  (1.626 m)    Weight:   Wt Readings from Last 1 Encounters:  06/30/18 76.1 kg  Ideal Body Weight:  54.5 kg  BMI:  Body mass index is 28.8 kg/m.  Estimated Nutritional Needs:   Kcal:  1600-1800kcal/day   Protein:  77-92g/day   Fluid:  >1.6L/day   Koleen Distance MS, RD, LDN Pager #- [phone] Office#- [phone] After Hours Pager: [phone]

## 2018-06-30 NOTE — Progress Notes (Signed)
Seen and examined w Mr Olean Ree PA. Ileus Obtain KUB likely will need NGT WBC improving No signs of leak Mobilize and continue A/BS for pneumonia

## 2018-07-01 ENCOUNTER — Inpatient Hospital Stay: Payer: Self-pay

## 2018-07-01 ENCOUNTER — Inpatient Hospital Stay: Payer: Medicare HMO

## 2018-07-01 LAB — CBC
HCT: 30.3 % — ABNORMAL LOW (ref 35.0–47.0)
Hemoglobin: 10.6 g/dL — ABNORMAL LOW (ref 12.0–16.0)
MCH: 34.4 pg — ABNORMAL HIGH (ref 26.0–34.0)
MCHC: 35.1 g/dL (ref 32.0–36.0)
MCV: 97.9 fL (ref 80.0–100.0)
PLATELETS: 307 10*3/uL (ref 150–440)
RBC: 3.09 MIL/uL — AB (ref 3.80–5.20)
RDW: 13.9 % (ref 11.5–14.5)
WBC: 10.5 10*3/uL (ref 3.6–11.0)

## 2018-07-01 LAB — BASIC METABOLIC PANEL
Anion gap: 12 (ref 5–15)
BUN: 13 mg/dL (ref 8–23)
CALCIUM: 7.9 mg/dL — AB (ref 8.9–10.3)
CO2: 23 mmol/L (ref 22–32)
Chloride: 102 mmol/L (ref 98–111)
Creatinine, Ser: 0.47 mg/dL (ref 0.44–1.00)
GFR calc Af Amer: >60 mL/min (ref >60)
GLUCOSE: 72 mg/dL (ref 70–99)
POTASSIUM: 3.6 mmol/L (ref 3.5–5.1)
SODIUM: 137 mmol/L (ref 135–145)

## 2018-07-01 LAB — URINALYSIS, COMPLETE (UACMP) WITH MICROSCOPIC
BACTERIA UA: NONE SEEN
Bilirubin Urine: NEGATIVE
Glucose, UA: NEGATIVE mg/dL
Ketones, ur: 80 mg/dL — AB
Leukocytes, UA: NEGATIVE
NITRITE: NEGATIVE
PROTEIN: 30 mg/dL — AB
SPECIFIC GRAVITY, URINE: 1.017 (ref 1.005–1.030)
pH: 6 (ref 5.0–8.0)

## 2018-07-01 LAB — CULTURE, RESPIRATORY
CULTURE: NORMAL
GRAM STAIN: NONE SEEN

## 2018-07-01 LAB — TRIGLYCERIDES: Triglycerides: 170 mg/dL — ABNORMAL HIGH (ref <150)

## 2018-07-01 LAB — GLUCOSE, CAPILLARY
GLUCOSE-CAPILLARY: 104 mg/dL — AB (ref 70–99)
Glucose-Capillary: 66 mg/dL — ABNORMAL LOW (ref 70–99)

## 2018-07-01 LAB — CULTURE, RESPIRATORY W GRAM STAIN

## 2018-07-01 LAB — PHOSPHORUS: PHOSPHORUS: 2.5 mg/dL (ref 2.5–4.6)

## 2018-07-01 LAB — MAGNESIUM: Magnesium: 1.9 mg/dL (ref 1.7–2.4)

## 2018-07-01 MED ORDER — SODIUM CHLORIDE 0.9% FLUSH: 10.0000 mL | INTRAVENOUS | Status: DC | PRN

## 2018-07-01 MED ORDER — INSULIN ASPART 100 UNIT/ML ~~LOC~~ SOLN
0.0000 [IU] | SUBCUTANEOUS | Status: DC
  Administered 2018-07-02: 1 [IU] via SUBCUTANEOUS
  Filled 2018-07-01: qty 1

## 2018-07-01 MED ORDER — SODIUM CHLORIDE 0.9% FLUSH
10.0000 mL | Freq: Two times a day (BID) | INTRAVENOUS | Status: DC
  Administered 2018-07-01 – 2018-07-02 (×3): 10 mL
  Administered 2018-07-02: 20 mL
  Administered 2018-07-03 – 2018-07-05 (×4): 10 mL
  Administered 2018-07-06: 20 mL
  Administered 2018-07-06 – 2018-07-09 (×4): 10 mL
  Administered 2018-07-09: 20 mL
  Administered 2018-07-10 – 2018-07-12 (×4): 10 mL

## 2018-07-01 MED ORDER — LACTATED RINGERS IV SOLN
INTRAVENOUS | Status: DC
  Administered 2018-07-01 – 2018-07-02 (×2): via INTRAVENOUS

## 2018-07-01 MED ORDER — TRACE MINERALS CR-CU-MN-SE-ZN 10-1000-500-60 MCG/ML IV SOLN
INTRAVENOUS | Status: AC
  Administered 2018-07-01: 19:00:00 via INTRAVENOUS
  Filled 2018-07-01: qty 960

## 2018-07-01 MED ORDER — BUDESONIDE 0.5 MG/2ML IN SUSP
0.5000 mg | Freq: Two times a day (BID) | RESPIRATORY_TRACT | Status: DC
  Administered 2018-07-01 – 2018-07-13 (×24): 0.5 mg via RESPIRATORY_TRACT
  Filled 2018-07-01 (×25): qty 2

## 2018-07-01 MED ORDER — LIDOCAINE VISCOUS HCL 2 % MT SOLN
15.0000 mL | OROMUCOSAL | Status: DC | PRN
  Filled 2018-07-01: qty 15

## 2018-07-01 MED ORDER — FAT EMULSION PLANT BASED 20 % IV EMUL
250.0000 mL | INTRAVENOUS | Status: AC
  Administered 2018-07-01: 250 mL via INTRAVENOUS
  Filled 2018-07-01: qty 250

## 2018-07-01 MED ORDER — PHENOL 1.4 % MT LIQD
1.0000 | OROMUCOSAL | Status: DC | PRN
  Administered 2018-07-01: 1 via OROMUCOSAL
  Filled 2018-07-01: qty 177

## 2018-07-01 NOTE — Progress Notes (Signed)
PT Cancellation Note  Patient Details Name: Kimberly [name] MRN: [id number] DOB: December 07, 1945   Cancelled Treatment:    Reason Eval/Treat Not Completed: Patient declined, no reason specified.  Order received.  Chart reviewed.  Pt requested to hold PT evaluation until tomorrow due to fatigue and pain.  RN notified that pt requested pain medication.  Will re-attempt tomorrow when pt is more appropriate.   Roxanne Gates, PT, DPT 07/01/2018, 3:34 PM

## 2018-07-01 NOTE — Care Management Important Message (Signed)
Copy of signed IM left with patient in room.  

## 2018-07-01 NOTE — Progress Notes (Signed)
Seen and examined Feels better after NGT Wbc trending down Abd: soft, no peritonitis or infection  Plan: Picc , NGT early post op SBO KUB in am may need CT a/p r/o abscess No need for acute surgical intervention at this time

## 2018-07-01 NOTE — Progress Notes (Cosign Needed Addendum)
[facility] Progress Note  6 Days Post-Op  Subjective: She noticed improvement in her abdominal distension this morning, but is still having incisional pain. No nausea or emesis. She has been borderline febrile and tachycardic in the last 12 hours. She still has a productive cough but additionally endorses dysuria without hematuria. She is currently NPO. Mobilizing. NGT placed yesterday afternoon.    Objective: Vital signs in last 24 hours: Temp:  [98.3 F (36.8 C)-100.1 F (37.8 C)] 100.1 F (37.8 C) (10/02 0436) Pulse Rate:  [103-109] 109 (10/02 0436) Resp:  [16-20] 16 (10/02 0436) BP: (120-143)/(66-74) 120/66 (10/02 0436) SpO2:  [80 %-91 %] 90 % (10/02 0755) Weight:  [76.1 kg] 76.1 kg (10/01 1128) Last BM Date: 06/24/18  Intake/Output from previous day: 10/01 0701 - 10/02 0700 In: 760 [P.O.:760] Out: 1350 [Urine:500; Emesis/NG output:850] Intake/Output this shift: Total I/O In: 3832.7 [I.V.:1849.9; IV Piggyback:1982.8] Out: 600 [Emesis/NG output:600]  PE: Gen:  Alert, NAD, pleasant Card:  Tachycardic and  Regular rhythm, pedal pulses 2+ BL Pulm:  Normal effort, on [place], end expiratory wheezing Abd: Soft, incisional tenderness, improvement in distension, tympanic to percussion, incisions C/D/I without erythema or drainage Skin: warm and dry, no rashes  Psych: A&Ox3   Lab Results:  Recent Labs    06/30/18 0352 07/01/18 0442  WBC 13.2* 10.5  HGB 11.1* 10.6*  HCT 32.2* 30.3*  PLT 299 307   BMET Recent Labs    06/30/18 0352 07/01/18 0442  NA 140 137  K 4.0 3.6  CL 105 102  CO2 27 23  GLUCOSE 117* 72  BUN 13 13  CREATININE 0.58 0.47  CALCIUM 8.3* 7.9*   PT/INR No results for input(s): LABPROT, INR in the last 72 hours. CMP     Component Value Date/Time   NA 137 07/01/2018 0442   NA 141 01/02/2017 1410   NA 136 01/27/2012 0637   K 3.6 07/01/2018 0442   K 4.3 01/27/2012 0637   CL 102 07/01/2018 0442   CL 102 01/27/2012 0637   CO2 23  07/01/2018 0442   CO2 25 01/27/2012 0637   GLUCOSE 72 07/01/2018 0442   GLUCOSE 119 (H) 01/27/2012 0637   BUN 13 07/01/2018 0442   BUN 14 01/02/2017 1410   BUN 15 01/27/2012 0637   CREATININE 0.47 07/01/2018 0442   CREATININE 0.67 01/27/2012 0637   CALCIUM 7.9 (L) 07/01/2018 0442   CALCIUM 7.7 (L) 01/27/2012 0637   PROT 6.7 06/26/2018 0452   PROT 7.0 01/02/2017 1410   PROT 7.6 01/26/2012 0021   ALBUMIN 3.3 (L) 06/26/2018 0452   ALBUMIN 3.9 01/02/2017 1410   ALBUMIN 3.5 01/26/2012 0021   AST 19 06/26/2018 0452   AST 17 01/26/2012 0021   ALT 12 06/26/2018 0452   ALT 19 01/26/2012 0021   ALKPHOS 56 06/26/2018 0452   ALKPHOS 87 01/26/2012 0021   BILITOT 0.7 06/26/2018 0452   BILITOT <0.2 01/02/2017 1410   BILITOT 0.2 01/26/2012 0021   GFRNONAA >60 07/01/2018 0442   GFRNONAA >60 01/27/2012 0637   GFRAA >60 07/01/2018 0442   GFRAA >60 01/27/2012 0637   Lipase     Component Value Date/Time   LIPASE 25 05/03/2017 1247       Studies/Results: Dg Abd 1 View  Result Date: 06/30/2018 CLINICAL DATA:  NG tube placement EXAM: ABDOMEN - 1 VIEW COMPARISON:  Earlier study of 06/30/2018 FINDINGS: Nasogastric tube coiled in proximal stomach. Persistent gaseous dilatation of small bowel loops in the upper and  mid abdomen. Persistent atelectasis versus infiltrate and small effusion at LEFT lung base. Close demineralized. IMPRESSION: Nasogastric tube tip at the proximal stomach. Persistent small bowel dilatation. Electronically Signed   By: Lavonia Dana M.D.   On: 06/30/2018 15:01   Dg Abd 1 View  Result Date: 06/30/2018 CLINICAL DATA:  Abdominal distension after ileostomy takedown on June 25, 2018. History of colonic malignancy, parastomal hernia repair twice. EXAM: ABDOMEN - 1 VIEW COMPARISON:  Supine abdominal radiograph of June 27, 2018 FINDINGS: There is moderate gaseous distention of the stomach. There remain loops of moderately distended gas-filled small bowel within the mid  and upper abdomen. There is a small amount of gas in the rectum. There is a relative paucity of colonic gas. Surgical skin staples are present over the right aspect of the pelvis and over the left flank. There is patchy density at the left lung base which is not new. IMPRESSION: Findings consistent with a mid to distal small bowel obstruction little changed from the previous study. There is no evidence of perforation. Left lower lobe atelectasis or pneumonia. Electronically Signed   By: David  Martinique M.D.   On: 06/30/2018 11:27    Anti-infectives: Anti-infectives (From admission, onward)   Start     Dose/Rate Route Frequency Ordered Stop   06/28/18 0600  vancomycin (VANCOCIN) IVPB 1000 mg/200 mL premix  Status:  Discontinued     1,000 mg 200 mL/hr over 60 Minutes Intravenous Every 12 hours 06/27/18 2358 06/28/18 1700   06/27/18 2245  vancomycin (VANCOCIN) IVPB 1000 mg/200 mL premix     1,000 mg 200 mL/hr over 60 Minutes Intravenous  Once 06/27/18 2243 06/28/18 1100   06/27/18 2215  ceFEPIme (MAXIPIME) 1 g in sodium chloride 0.9 % 100 mL IVPB     1 g 200 mL/hr over 30 Minutes Intravenous Every 8 hours 06/27/18 2207 07/05/18 2159   06/25/18 0600  ertapenem (INVANZ) 1,000 mg in sodium chloride 0.9 % 100 mL IVPB     1 g 200 mL/hr over 30 Minutes Intravenous On call to O.R. 06/24/18 2208 06/25/18 1448       Assessment/Plan  S/P Ileostomy Takedown  - POD6, NGT place yesterday afternoon for persistent distension and possible ileus on KUB. She has been borderline febrile and tachycardic in the last 12 hours, will monitor and check urine for additional source of infection, however, if she continues to not show signs of improvement with obtain CT w/ contrast to r/o abscess - KUB not showing improvement bowel dilation this morning - Remain NPO - Will consult for PICC Line placement and TPN today, Mag and Phos levels are normal this morning - Continue IVF, monitor UO - Leukocytosis resolved -  Encouraged mobilization  HCAP - Cough has improved - Hospitalist following, appreciate their help - Leukocytosis resolved - Continue IV ABx, bronchodilators - Encouraged IS use, aggressive pulmonary toilet - Does not use O2 at home, wean as tolerates    LOS: 6 days    Edison Simon , PA-C [facility] 07/01/2018, 8:26 AM [phone] M-F: 7am - 4pm

## 2018-07-01 NOTE — Progress Notes (Signed)
Nutrition Follow Up Note   DOCUMENTATION CODES:   Not applicable  INTERVENTION:   Initiate Clinimix 5/15 with electrolytes at 73m/hr + 20% lipids _0 /hr x 12 hrs/day (Goal rate 724mhr once labs stable)   Regimen provides 1758kcal/day, 90g/day protein, 20407molume    Add MVI daily   Add trace elements daily    Pt at high refeeding risk; recommend check P, K, and Mg daily for at least 3 days after TPN iniatition.    Daily weights  Check triglycerides    Per MD, keep IVF + TPN rate _1 /hr  NUTRITION DIAGNOSIS:   Inadequate oral intake related to acute illness as evidenced by other (comment)(pt on NPO/clear liquid diet for 7 days).  GOAL:   Patient will meet greater than or equal to 90% of their needs  -not met  MONITOR:   PO intake, Supplement acceptance, Diet advancement, Labs, Weight trends, Skin, I & O's  REASON FOR ASSESSMENT:   Consult New TPN/TNA  ASSESSMENT:   [age] female with h/o COPD, partial colectomy with end colostomy and mucous fistula on 04/15/2017. Recent parastomal hernia leading to end ileostomy creation on 12/03/2017, now s/p colostomy takedown with ileocolostomy and parastomal hernia repair 9/23/88mplicated by post op ileus and HCAP   KUB not showing improvement of bowel dilation per MD note. Pt made NPO. NGT placed with 850m37mtput. Plan for PICC line and TPN today. Pt likely at moderate refeeding risk; recommend monitor K, Mg and P.   Medications reviewed and include: lovenox, protonix, cefepime, LRS _2 /hr  Labs reviewed: K 3.6 wnl, P 2.5 wnl, Mg 1.9 wnl  Diet Order:   Diet Order            Diet NPO time specified Except for: Ice Chips  Diet effective now             EDUCATION NEEDS:   Education needs have been addressed  Skin:  Skin Assessment: Reviewed RN Assessment(Incision abdomen )  Last BM:  9/26  Height:   Ht Readings from Last 1 Encounters:  06/25/18 _3  (1.626 m)    Weight:   Wt Readings from Last 1  Encounters:  06/30/18 76.1 kg    Ideal Body Weight:  54.5 kg  BMI:  Body mass index is 28.8 kg/m.  Estimated Nutritional Needs:   Kcal:  1600-1800kcal/day   Protein:  77-92g/day   Fluid:  >1.6L/day   CaseKoleen Distance RD, LDN Pager #- [phone] 1822ice#- [phone] Hours Pager: [phone]

## 2018-07-01 NOTE — Progress Notes (Signed)
[facility] CONSULT NOTE   Pharmacy Consult for TPN Indication: post-op ileus  Patient Measurements: Height: 5\' 4"  (162.6 cm) Weight: 167 lb 12.8 oz (76.1 kg) IBW/kg (Calculated) : 54.7 TPN AdjBW (KG): 60.2 Body mass index is 28.8 kg/m.  Assessment: NGT place yesterday afternoon for persistent distension and possible ileus on KUB not showing improvement. She has now been 7 days without eating.  She has a h/o COPD, partial colectomy with end colostomy and mucous fistula on 04/15/2017. Recent parastomal hernia leading to end ileostomy creation on 12/03/2017, now s/p colostomy takedown with ileocolostomy and parastomal hernia repair 2/39 complicated by post op ileus and HCAP   GI: PPI Endo: no h/o DM Insulin requirements in the past 24 hours: none Lytes: K 3.6 wnl, P 2.5 wnl, Mg 1.9 wnl Renal: SCr<1 Pulm: Cards:  Hepatobil: Neuro: RV:UYEB PCR (-) WBC 18>22.6>18>14>13.2>10.5 9/28 BCx pending Cefepime 9/29>>  TPN Access: PICC line ordered TPN start date: 07/01/18 Nutritional Goals (per RD recommendation on 10/02): KCal: 1758 kcal Protein: 90g Fluid:2053ml  Goal TPN rate is 75 ml/hr  Current Nutrition:   Plan:  Initiate E5/15 TPN at 61mL/hr. Initiate 20% lipid emulsion at 36ml/h for 12 hours/day This TPN provides 49 g of protein, 300 g of dextrose, and 48 g of lipids which provides 961 kCals per day, meeting 55% of patient needs Electrolytes in TPN: no additional Add MVI, trace elements Mild ACHS SSI and adjust as needed LR IVMF at 60 ml/hr Monitor TPN labs: CMP baseline from 9/27, baseline TG for tomorrow, BMP + mag, phos daily x3 days, then twice weekly F/U tomorrow am  Dallie Piles, PharmD 07/01/2018,11:26 AM

## 2018-07-01 NOTE — Progress Notes (Signed)
[facility] [place] NAME: Kimberly [name]    MR#:  [id number]  DATE OF BIRTH:  [birth year]  SUBJECTIVE:  CHIEF COMPLAINT:  No chief complaint on file. Patient with persistent low-grade fevers, tachycardia, acute hypoxia, now NG to placement for acute ileus, plans for PICC line with TPN to start, recheck fever work-up with two-view chest x-ray/blood cultures/urinalysis, increase activity/ambulate, encourage incentive spirometer, schedule breathing treatments REVIEW OF SYSTEMS:  CONSTITUTIONAL: No fever, fatigue or weakness.  EYES: No blurred or double vision.  EARS, NOSE, AND THROAT: No tinnitus or ear pain.  RESPIRATORY: No cough, shortness of breath, wheezing or hemoptysis.  CARDIOVASCULAR: No chest pain, orthopnea, edema.  GASTROINTESTINAL: No nausea, vomiting, diarrhea or abdominal pain.  GENITOURINARY: No dysuria, hematuria.  ENDOCRINE: No polyuria, nocturia,  HEMATOLOGY: No anemia, easy bruising or bleeding SKIN: No rash or lesion. MUSCULOSKELETAL: No joint pain or arthritis.   NEUROLOGIC: No tingling, numbness, weakness.  PSYCHIATRY: No anxiety or depression.   ROS  DRUG ALLERGIES:   Allergies  Allergen Reactions  . Contrast Media [Iodinated Diagnostic Agents]     Skin rash  . Betadine [Povidone Iodine] Other (See Comments)    Topical iodine she states "if you put it in an open sore it will cause a eating ulcer."  . Other Nausea And Vomiting    Antihystamines  . Sinus Formula [Cholestatin] Nausea Only    VITALS:  Blood pressure 120/66, pulse (!) 109, temperature 100.1 F (37.8 C), temperature source Rectal, resp. rate 16, height 5\' 4"  (1.626 m), weight 76.1 kg, SpO2 90 %.  PHYSICAL EXAMINATION:  GENERAL:  72 y.o.-year-old patient lying in the bed with no acute distress.  EYES: Pupils equal, round, reactive to light and accommodation. No scleral icterus. Extraocular muscles intact.  HEENT: Head atraumatic, normocephalic.  Oropharynx and nasopharynx clear.  NECK:  Supple, no jugular venous distention. No thyroid enlargement, no tenderness.  LUNGS: Normal breath sounds bilaterally, no wheezing, rales,rhonchi or crepitation. No use of accessory muscles of respiration.  CARDIOVASCULAR: S1, S2 normal. No murmurs, rubs, or gallops.  ABDOMEN: Soft, nontender, nondistended. Bowel sounds present. No organomegaly or mass.  EXTREMITIES: No pedal edema, cyanosis, or clubbing.  NEUROLOGIC: Cranial nerves II through XII are intact. Muscle strength 5/5 in all extremities. Sensation intact. Gait not checked.  PSYCHIATRIC: The patient is alert and oriented x 3.  SKIN: No obvious rash, lesion, or ulcer.   Physical Exam LABORATORY PANEL:   CBC Recent Labs  Lab 07/01/18 0442  WBC 10.5  HGB 10.6*  HCT 30.3*  PLT 307   ------------------------------------------------------------------------------------------------------------------  Chemistries  Recent Labs  Lab 06/26/18 0452  07/01/18 0442  NA 139   < > 137  K 4.1   < > 3.6  CL 103   < > 102  CO2 27   < > 23  GLUCOSE 131*   < > 72  BUN 10   < > 13  CREATININE 0.78   < > 0.47  CALCIUM 8.2*   < > 7.9*  MG 1.8  --  1.9  AST 19  --   --   ALT 12  --   --   ALKPHOS 56  --   --   BILITOT 0.7  --   --    < > = values in this interval not displayed.   ------------------------------------------------------------------------------------------------------------------  Cardiac Enzymes No results for input(s): TROPONINI in the last 168 hours. ------------------------------------------------------------------------------------------------------------------  RADIOLOGY:  Dg Abd 1  View  Result Date: 07/01/2018 CLINICAL DATA:  Abdominal distention EXAM: ABDOMEN - 1 VIEW COMPARISON:  06/30/2018; 06/27/2018; 12/02/2017; abdominal CT-05/20/2018 FINDINGS: Enteric tube tip and side port overlies the expected location of the gastric fundus. There is moderate gaseous  distention multiple loops of small bowel with index loop of small bowel within the left mid hemiabdomen measuring approximately 5.9 cm in diameter. This finding is associated with a conspicuous paucity of distal colonic gas. No supine evidence of pneumoperitoneum. No pneumatosis or portal venous gas Limited visualization of the lower thorax demonstrates left basilar heterogeneous/consolidative opacities. Calcified phleboliths/diverticuli overlies the left hemipelvis. Skin staples overlie the midline of the right side of the abdomen as well as the right groin. Post dynamic screw fixation of the left femoral neck with displaced ossicle adjacent to the superior aspect of the greater trochanter unchanged. IMPRESSION: Similar findings worrisome for small bowel obstruction. Electronically Signed   By: Sandi Mariscal M.D.   On: 07/01/2018 08:25   Dg Abd 1 View  Result Date: 06/30/2018 CLINICAL DATA:  NG tube placement EXAM: ABDOMEN - 1 VIEW COMPARISON:  Earlier study of 06/30/2018 FINDINGS: Nasogastric tube coiled in proximal stomach. Persistent gaseous dilatation of small bowel loops in the upper and mid abdomen. Persistent atelectasis versus infiltrate and small effusion at LEFT lung base. Close demineralized. IMPRESSION: Nasogastric tube tip at the proximal stomach. Persistent small bowel dilatation. Electronically Signed   By: Lavonia Dana M.D.   On: 06/30/2018 15:01   Dg Abd 1 View  Result Date: 06/30/2018 CLINICAL DATA:  Abdominal distension after ileostomy takedown on June 25, 2018. History of colonic malignancy, parastomal hernia repair twice. EXAM: ABDOMEN - 1 VIEW COMPARISON:  Supine abdominal radiograph of June 27, 2018 FINDINGS: There is moderate gaseous distention of the stomach. There remain loops of moderately distended gas-filled small bowel within the mid and upper abdomen. There is a small amount of gas in the rectum. There is a relative paucity of colonic gas. Surgical skin staples are  present over the right aspect of the pelvis and over the left flank. There is patchy density at the left lung base which is not new. IMPRESSION: Findings consistent with a mid to distal small bowel obstruction little changed from the previous study. There is no evidence of perforation. Left lower lobe atelectasis or pneumonia. Electronically Signed   By: David  Martinique M.D.   On: 06/30/2018 11:27   [place] Ekg Site Rite  Result Date: 07/01/2018 If Site Rite image not attached, placement could not be confirmed due to current cardiac rhythm.   ASSESSMENT AND PLAN:  *Status post ileostomy takedown NG tube replaced, x-rays noted for ileus versus obstruction, general surgery managing  *Acute recurrent persistent low-grade fevers Check fever work-up with two-view chest x-ray, repeat blood cultures, check urinalysis, encourage incentive spirometer, check lower extremity Dopplers  *Acute hospital-acquired pneumonia Continue pneumonia protocol, empiric cefepime, cultures negative to date  *Acute recurrent hypoxic respiratory failure Likely secondary to pneumonia Scheduled breathing treatments, supplemental oxygen wean as tolerated, encourage senna spirometer, for 2 view chest x-ray, mucolytic agents     *COPD with acute exacerbation, mild  Aggressive pulmonary toilet with bronchodilator therapy, inhaled corticosteroids twice daily, supplemental oxygen wean as tolerated, will hold off on IV Solu-Medrol at this time, and continue close medical monitoring   All the records are reviewed and case discussed with Care Management/Social Workerr. Management plans discussed with the patient, family and they are in agreement.  CODE STATUS: full  TOTAL TIME TAKING  CARE OF THIS PATIENT: 35 minutes.     POSSIBLE D/C IN 1-3 DAYS, DEPENDING ON CLINICAL CONDITION.   Avel Peace Salary M.D on 07/01/2018   Between 7am to 6pm - Pager - [phone]  After 6pm go to [email or web address] - password [email or web address] [facility]  Office  [phone]  CC: Primary care physician; Birdie Sons, MD  Note: This dictation was prepared with Dragon dictation along with smaller phrase technology. Any transcriptional errors that result from this process are unintentional.

## 2018-07-01 NOTE — Plan of Care (Addendum)
Patient returned from Xray and [place]. Portable canister had 100cc of bright red blood.  Prior to leaving floor, 400cc was emptied from canister (Dark yellow/Green - no blood noted).

## 2018-07-01 NOTE — Progress Notes (Signed)
Peripherally Inserted Central Catheter/Midline Placement  The IV Nurse has discussed with the patient and/or persons authorized to consent for the patient, the purpose of this procedure and the potential benefits and risks involved with this procedure.  The benefits include less needle sticks, lab draws from the catheter, and the patient may be discharged home with the catheter. Risks include, but not limited to, infection, bleeding, blood clot (thrombus formation), and puncture of an artery; nerve damage and irregular heartbeat and possibility to perform a PICC exchange if needed/ordered by physician.  Alternatives to this procedure were also discussed.  Bard Power PICC patient education guide, fact sheet on infection prevention and patient information card has been provided to patient /or left at bedside.    PICC/Midline Placement Documentation  PICC Double Lumen 70/92/95 PICC Right Basilic 35 cm 0 cm (Active)  Indication for Insertion or Continuance of Line Administration of hyperosmolar/irritating solutions (i.e. TPN, Vancomycin, etc.) 07/01/2018 12:53 PM  Exposed Catheter (cm) 0 cm 07/01/2018 12:53 PM  Site Assessment Clean;Dry;Intact 07/01/2018 12:53 PM  Lumen #1 Status Flushed;Blood return noted 07/01/2018 12:53 PM  Lumen #2 Status Flushed;Blood return noted 07/01/2018 12:53 PM  Dressing Type Transparent 07/01/2018 12:53 PM  Dressing Status Clean;Dry;Intact;Antimicrobial disc in place 07/01/2018 12:53 PM  Dressing Intervention New dressing 07/01/2018 12:53 PM  Dressing Change Due 07/08/18 07/01/2018 12:53 PM       Christella Noa Albarece 07/01/2018, 12:55 PM

## 2018-07-02 ENCOUNTER — Encounter: Payer: Self-pay | Admitting: Radiology

## 2018-07-02 ENCOUNTER — Inpatient Hospital Stay: Payer: Medicare HMO

## 2018-07-02 LAB — BASIC METABOLIC PANEL
Anion gap: 8 (ref 5–15)
BUN: 11 mg/dL (ref 8–23)
CALCIUM: 8 mg/dL — AB (ref 8.9–10.3)
CO2: 28 mmol/L (ref 22–32)
Chloride: 100 mmol/L (ref 98–111)
Creatinine, Ser: 0.44 mg/dL (ref 0.44–1.00)
GFR calc Af Amer: >60 mL/min (ref >60)
GLUCOSE: 133 mg/dL — AB (ref 70–99)
POTASSIUM: 2.9 mmol/L — AB (ref 3.5–5.1)
SODIUM: 136 mmol/L (ref 135–145)

## 2018-07-02 LAB — MAGNESIUM: Magnesium: 1.8 mg/dL (ref 1.7–2.4)

## 2018-07-02 LAB — GLUCOSE, CAPILLARY
GLUCOSE-CAPILLARY: 131 mg/dL — AB (ref 70–99)
GLUCOSE-CAPILLARY: 141 mg/dL — AB (ref 70–99)
Glucose-Capillary: 135 mg/dL — ABNORMAL HIGH (ref 70–99)
Glucose-Capillary: 137 mg/dL — ABNORMAL HIGH (ref 70–99)

## 2018-07-02 LAB — CBC
HCT: 30.3 % — ABNORMAL LOW (ref 35.0–47.0)
Hemoglobin: 10.6 g/dL — ABNORMAL LOW (ref 12.0–16.0)
MCH: 33.2 pg (ref 26.0–34.0)
MCHC: 34.8 g/dL (ref 32.0–36.0)
MCV: 95.3 fL (ref 80.0–100.0)
PLATELETS: 328 10*3/uL (ref 150–440)
RBC: 3.18 MIL/uL — ABNORMAL LOW (ref 3.80–5.20)
RDW: 13.8 % (ref 11.5–14.5)
WBC: 13.1 10*3/uL — ABNORMAL HIGH (ref 3.6–11.0)

## 2018-07-02 LAB — PHOSPHORUS: Phosphorus: 2.4 mg/dL — ABNORMAL LOW (ref 2.5–4.6)

## 2018-07-02 LAB — CULTURE, BLOOD (ROUTINE X 2)
CULTURE: NO GROWTH
Culture: NO GROWTH
Special Requests: ADEQUATE

## 2018-07-02 MED ORDER — IOPAMIDOL (ISOVUE-300) INJECTION 61%: 15.0000 mL | INTRAVENOUS | Status: AC

## 2018-07-02 MED ORDER — K PHOS MONO-SOD PHOS DI & MONO 155-852-130 MG PO TABS
500.0000 mg | ORAL_TABLET | ORAL | Status: AC
  Filled 2018-07-02 (×2): qty 2

## 2018-07-02 MED ORDER — INSULIN ASPART 100 UNIT/ML ~~LOC~~ SOLN
0.0000 [IU] | Freq: Four times a day (QID) | SUBCUTANEOUS | Status: DC
  Administered 2018-07-02 – 2018-07-04 (×10): 1 [IU] via SUBCUTANEOUS
  Administered 2018-07-05: 2 [IU] via SUBCUTANEOUS
  Administered 2018-07-05 – 2018-07-06 (×4): 1 [IU] via SUBCUTANEOUS
  Administered 2018-07-06: 2 [IU] via SUBCUTANEOUS
  Administered 2018-07-06 – 2018-07-07 (×4): 1 [IU] via SUBCUTANEOUS
  Administered 2018-07-07: 2 [IU] via SUBCUTANEOUS
  Administered 2018-07-08 (×4): 1 [IU] via SUBCUTANEOUS
  Administered 2018-07-09: 2 [IU] via SUBCUTANEOUS
  Administered 2018-07-09 – 2018-07-11 (×5): 1 [IU] via SUBCUTANEOUS
  Filled 2018-07-02 (×31): qty 1

## 2018-07-02 MED ORDER — POTASSIUM CHLORIDE 10 MEQ/100ML IV SOLN
10.0000 meq | INTRAVENOUS | Status: AC
  Administered 2018-07-02 (×4): 10 meq via INTRAVENOUS
  Filled 2018-07-02 (×4): qty 100

## 2018-07-02 MED ORDER — LACTATED RINGERS IV SOLN
INTRAVENOUS | Status: DC
  Administered 2018-07-02 – 2018-07-06 (×4): via INTRAVENOUS

## 2018-07-02 MED ORDER — METRONIDAZOLE IN NACL 5-0.79 MG/ML-% IV SOLN
500.0000 mg | Freq: Three times a day (TID) | INTRAVENOUS | Status: DC
  Administered 2018-07-02 – 2018-07-06 (×12): 500 mg via INTRAVENOUS
  Filled 2018-07-02 (×14): qty 100

## 2018-07-02 MED ORDER — FAT EMULSION PLANT BASED 20 % IV EMUL
250.0000 mL | INTRAVENOUS | Status: AC
  Administered 2018-07-02: 250 mL via INTRAVENOUS
  Filled 2018-07-02: qty 250

## 2018-07-02 MED ORDER — TRACE MINERALS CR-CU-MN-SE-ZN 10-1000-500-60 MCG/ML IV SOLN
INTRAVENOUS | Status: AC
  Administered 2018-07-02: 18:00:00 via INTRAVENOUS
  Filled 2018-07-02: qty 1800

## 2018-07-02 MED ORDER — IOPAMIDOL (ISOVUE-300) INJECTION 61%
100.0000 mL | Freq: Once | INTRAVENOUS | Status: AC | PRN
  Administered 2018-07-02: 100 mL via INTRAVENOUS

## 2018-07-02 NOTE — Progress Notes (Addendum)
[facility] Progress Note  7 Days Post-Op  Subjective: No acute events overnight. She notes minimal abdominal pain with ambulation, no nausea or emesis. NGT output around 1500 ccs in last 24 hours. Still with productive cough, no CP or SOB. Has been mobilizing. No flatus. No BM.   Objective: Vital signs in last 24 hours: Temp:  [98.3 F (36.8 C)-100.1 F (37.8 C)] 98.5 F (36.9 C) (10/03 0346) Pulse Rate:  [86-105] 97 (10/03 0346) Resp:  [18-22] 22 (10/03 0346) BP: (111-156)/(68-77) 156/77 (10/03 0346) SpO2:  [92 %-94 %] 93 % (10/03 0735) Last BM Date: 06/25/18  Intake/Output from previous day: 10/02 0701 - 10/03 0700 In: 7117.1 [I.V.:3165.9; IV Piggyback:3951.2] Out: 3650 [Urine:2100; Emesis/NG output:1550] Intake/Output this shift: Total I/O In: 283.3 [I.V.:183.3; IV Piggyback:100] Out: 100 [Emesis/NG output:100]  PE: Gen:  Alert, NAD, pleasant HEENT: NGT in place Card:  Tachycardic and  Regular rhythm, pedal pulses 2+ BL Pulm:  Normal effort, on [place], end expiratory wheezing Abd: Soft, incisional tenderness, improvement in distension, tympanic to percussion, incisions C/D/I without erythema or drainage Skin: warm and dry, no rashes  Psych: A&Ox3   Lab Results:  Recent Labs    07/01/18 0442 07/02/18 0458  WBC 10.5 13.1*  HGB 10.6* 10.6*  HCT 30.3* 30.3*  PLT 307 328   BMET Recent Labs    07/01/18 0442 07/02/18 0458  NA 137 136  K 3.6 2.9*  CL 102 100  CO2 23 28  GLUCOSE 72 133*  BUN 13 11  CREATININE 0.47 0.44  CALCIUM 7.9* 8.0*   PT/INR No results for input(s): LABPROT, INR in the last 72 hours. CMP     Component Value Date/Time   NA 136 07/02/2018 0458   NA 141 01/02/2017 1410   NA 136 01/27/2012 0637   K 2.9 (L) 07/02/2018 0458   K 4.3 01/27/2012 0637   CL 100 07/02/2018 0458   CL 102 01/27/2012 0637   CO2 28 07/02/2018 0458   CO2 25 01/27/2012 0637   GLUCOSE 133 (H) 07/02/2018 0458   GLUCOSE 119 (H) 01/27/2012 0637    BUN 11 07/02/2018 0458   BUN 14 01/02/2017 1410   BUN 15 01/27/2012 0637   CREATININE 0.44 07/02/2018 0458   CREATININE 0.67 01/27/2012 0637   CALCIUM 8.0 (L) 07/02/2018 0458   CALCIUM 7.7 (L) 01/27/2012 0637   PROT 6.7 06/26/2018 0452   PROT 7.0 01/02/2017 1410   PROT 7.6 01/26/2012 0021   ALBUMIN 3.3 (L) 06/26/2018 0452   ALBUMIN 3.9 01/02/2017 1410   ALBUMIN 3.5 01/26/2012 0021   AST 19 06/26/2018 0452   AST 17 01/26/2012 0021   ALT 12 06/26/2018 0452   ALT 19 01/26/2012 0021   ALKPHOS 56 06/26/2018 0452   ALKPHOS 87 01/26/2012 0021   BILITOT 0.7 06/26/2018 0452   BILITOT <0.2 01/02/2017 1410   BILITOT 0.2 01/26/2012 0021   GFRNONAA >60 07/02/2018 0458   GFRNONAA >60 01/27/2012 0637   GFRAA >60 07/02/2018 0458   GFRAA >60 01/27/2012 0637   Lipase     Component Value Date/Time   LIPASE 25 05/03/2017 1247       Studies/Results: Dg Chest 2 View  Result Date: 07/01/2018 CLINICAL DATA:  Fever, cough, former smoking history EXAM: CHEST - 2 VIEW COMPARISON:  Chest x-ray of 06/28/2017 FINDINGS: A passage of remains at both lung bases some which may be due to atelectasis. However, there does appear to be a small left pleural effusion and developing pneumonia  cannot be excluded. Right-sided central venous line tip overlies the mid SVC. No pneumothorax is seen. NG tube extends into the stomach. Heart size is stable. IMPRESSION: 1. Bibasilar opacities left-greater-than-right consistent with effusion, atelectasis, but pneumonia cannot be excluded. 2. Right PICC line tip extends to the mid SVC. Electronically Signed   By: Ivar Drape M.D.   On: 07/01/2018 15:13   Dg Abd 1 View  Result Date: 07/02/2018 CLINICAL DATA:  Low-grade fevers and tachycardia EXAM: ABDOMEN - 1 VIEW COMPARISON:  07/01/2018 FINDINGS: Nasogastric catheter is again noted within the stomach. Scattered large and small bowel gas is noted primarily within the small bowel. Persistent small bowel dilatation is noted with  some loops measuring approximately 6.8 cm. No free air is seen. No other focal abnormality is noted. Postsurgical changes in the left hip are noted. IMPRESSION: Changes again most consistent with small bowel obstruction. Electronically Signed   By: Inez Catalina M.D.   On: 07/02/2018 08:31   Dg Abd 1 View  Result Date: 07/01/2018 CLINICAL DATA:  Abdominal distention EXAM: ABDOMEN - 1 VIEW COMPARISON:  06/30/2018; 06/27/2018; 12/02/2017; abdominal CT-05/20/2018 FINDINGS: Enteric tube tip and side port overlies the expected location of the gastric fundus. There is moderate gaseous distention multiple loops of small bowel with index loop of small bowel within the left mid hemiabdomen measuring approximately 5.9 cm in diameter. This finding is associated with a conspicuous paucity of distal colonic gas. No supine evidence of pneumoperitoneum. No pneumatosis or portal venous gas Limited visualization of the lower thorax demonstrates left basilar heterogeneous/consolidative opacities. Calcified phleboliths/diverticuli overlies the left hemipelvis. Skin staples overlie the midline of the right side of the abdomen as well as the right groin. Post dynamic screw fixation of the left femoral neck with displaced ossicle adjacent to the superior aspect of the greater trochanter unchanged. IMPRESSION: Similar findings worrisome for small bowel obstruction. Electronically Signed   By: Sandi Mariscal M.D.   On: 07/01/2018 08:25   Dg Abd 1 View  Result Date: 06/30/2018 CLINICAL DATA:  NG tube placement EXAM: ABDOMEN - 1 VIEW COMPARISON:  Earlier study of 06/30/2018 FINDINGS: Nasogastric tube coiled in proximal stomach. Persistent gaseous dilatation of small bowel loops in the upper and mid abdomen. Persistent atelectasis versus infiltrate and small effusion at LEFT lung base. Close demineralized. IMPRESSION: Nasogastric tube tip at the proximal stomach. Persistent small bowel dilatation. Electronically Signed   By: Lavonia Dana  M.D.   On: 06/30/2018 15:01   Dg Abd 1 View  Result Date: 06/30/2018 CLINICAL DATA:  Abdominal distension after ileostomy takedown on June 25, 2018. History of colonic malignancy, parastomal hernia repair twice. EXAM: ABDOMEN - 1 VIEW COMPARISON:  Supine abdominal radiograph of June 27, 2018 FINDINGS: There is moderate gaseous distention of the stomach. There remain loops of moderately distended gas-filled small bowel within the mid and upper abdomen. There is a small amount of gas in the rectum. There is a relative paucity of colonic gas. Surgical skin staples are present over the right aspect of the pelvis and over the left flank. There is patchy density at the left lung base which is not new. IMPRESSION: Findings consistent with a mid to distal small bowel obstruction little changed from the previous study. There is no evidence of perforation. Left lower lobe atelectasis or pneumonia. Electronically Signed   By: David  Martinique M.D.   On: 06/30/2018 11:27   US Venous Img Lower Bilateral  Result Date: 07/01/2018 CLINICAL DATA:  Pain and  swelling of the lower extremities EXAM: BILATERAL LOWER EXTREMITY VENOUS DOPPLER ULTRASOUND TECHNIQUE: Gray-scale sonography with graded compression, as well as color Doppler and duplex ultrasound were performed to evaluate the lower extremity deep venous systems from the level of the common femoral vein and including the common femoral, femoral, profunda femoral, popliteal and calf veins including the posterior tibial, peroneal and gastrocnemius veins when visible. The superficial great saphenous vein was also interrogated. Spectral Doppler was utilized to evaluate flow at rest and with distal augmentation maneuvers in the common femoral, femoral and popliteal veins. COMPARISON:  None. FINDINGS: RIGHT LOWER EXTREMITY Common Femoral Vein: No evidence of thrombus. Normal compressibility, respiratory phasicity and response to augmentation. Saphenofemoral Junction: No  evidence of thrombus. Normal compressibility and flow on color Doppler imaging. Profunda Femoral Vein: No evidence of thrombus. Normal compressibility and flow on color Doppler imaging. Femoral Vein: No evidence of thrombus. Normal compressibility, respiratory phasicity and response to augmentation. Popliteal Vein: No evidence of thrombus. Normal compressibility, respiratory phasicity and response to augmentation. Calf Veins: No evidence of thrombus. Normal compressibility and flow on color Doppler imaging. Superficial Great Saphenous Vein: No evidence of thrombus. Normal compressibility. Venous Reflux:  None. Other Findings:  None. LEFT LOWER EXTREMITY Common Femoral Vein: No evidence of thrombus. Normal compressibility, respiratory phasicity and response to augmentation. Saphenofemoral Junction: No evidence of thrombus. Normal compressibility and flow on color Doppler imaging. Profunda Femoral Vein: No evidence of thrombus. Normal compressibility and flow on color Doppler imaging. Femoral Vein: No evidence of thrombus. Normal compressibility, respiratory phasicity and response to augmentation. Popliteal Vein: No evidence of thrombus. Normal compressibility, respiratory phasicity and response to augmentation. Calf Veins: No evidence of thrombus. Normal compressibility and flow on color Doppler imaging. Superficial Great Saphenous Vein: No evidence of thrombus. Normal compressibility. Venous Reflux:  None. Other Findings:  None. IMPRESSION: No significant DVT in either lower extremity. Electronically Signed   By: Jerilynn Mages.  Shick M.D.   On: 07/01/2018 14:50   [place] Ekg Site Rite  Result Date: 07/01/2018 If Site Rite image not attached, placement could not be confirmed due to current cardiac rhythm.   Anti-infectives: Anti-infectives (From admission, onward)   Start     Dose/Rate Route Frequency Ordered Stop   06/28/18 0600  vancomycin (VANCOCIN) IVPB 1000 mg/200 mL premix  Status:  Discontinued     1,000 mg 200  mL/hr over 60 Minutes Intravenous Every 12 hours 06/27/18 2358 06/28/18 1700   06/27/18 2245  vancomycin (VANCOCIN) IVPB 1000 mg/200 mL premix     1,000 mg 200 mL/hr over 60 Minutes Intravenous  Once 06/27/18 2243 06/28/18 1100   06/27/18 2215  ceFEPIme (MAXIPIME) 1 g in sodium chloride 0.9 % 100 mL IVPB     1 g 200 mL/hr over 30 Minutes Intravenous Every 8 hours 06/27/18 2207 07/05/18 2159   06/25/18 0600  ertapenem (INVANZ) 1,000 mg in sodium chloride 0.9 % 100 mL IVPB     1 g 200 mL/hr over 30 Minutes Intravenous On call to O.R. 06/24/18 2208 06/25/18 1448       Assessment/Plan  S/P Ileostomy Takedown  - POD7, NGT in place with 1500 ccs over last 24 hours out, KUB unchanged small bowel obstruction appearance, will obtain CT Abdomen/Pelvis with PO and IV contrast to r/o abscess and further assess possible ileus vs obstruction -Remain NPO, currently on TPN, dietitian following appreciate their help - Hypokalemia, will replete -Continue IVF, monitor UO - Leukocytosis to 13.1 this AM, continue to follow - Encouraged  mobilization  HCAP - Cough has improved - Hospitalist following, appreciate their help - Leukocytosis to 13.1 - Continue IV ABx, bronchodilators - Encouraged IS use, aggressive pulmonary toilet - Does not use O2 at home, wean as tolerates    LOS: 7 days    Edison Simon , PA-C  [facility] 07/02/2018, 9:40 AM [phone] M-F: 7am - 4pm

## 2018-07-02 NOTE — Progress Notes (Signed)
MD aware of low potassium. Orders given for pharmacy to review electrolyte replacement.

## 2018-07-02 NOTE — Progress Notes (Signed)
Kimberly [name] CONSULT NOTE   Pharmacy Consult for TPN Indication: post-op ileus  Patient Measurements: Height: 5\' 4"  (162.6 cm) Weight: 167 lb 12.8 oz (76.1 kg) IBW/kg (Calculated) : 54.7 TPN AdjBW (KG): 60.2 Body mass index is 28.8 kg/m.  Assessment: NGT place yesterday afternoon for persistent distension and possible ileus on KUB not showing improvement. She has now been 7 days without eating.  She has a h/o COPD, partial colectomy with end colostomy and mucous fistula on 04/15/2017. Recent parastomal hernia leading to end ileostomy creation on 12/03/2017, now s/p colostomy takedown with ileocolostomy and parastomal hernia repair 5/88 complicated by post op ileus and HCAP   GI: PPI, TG 170 Endo: no h/o DM Insulin requirements in the past 24 hours: none Lytes: K 2.9, P 2.4, Mg 1.8 wnl Replacing with: IV KCl 69mEq, potassium phosphate 500mg  q4h x2 Renal: SCr<1 Pulm: Cards:  Hepatobil: Neuro: TG:PQDI PCR (-) WBC 18>22.6>18>14>13.2>10.5 9/28 BCx pending Cefepime 9/29>>       For HCAP  TPN Access: 07/01/18 TPN start date: 07/01/18 Nutritional Goals (per RD recommendation on 10/02): KCal: 1758 kcal Protein: 90g Fluid:2013ml  Goal TPN rate is 75 ml/hr  Current Nutrition:   Plan:  Advance E5/15 TPN to 58mL/hr. Resume 20% lipid emulsion at 53ml/h for 12 hours/day This TPN provides 90 g of protein, 270 g of dextrose, and 48 g of lipids which provides 961 kCals per day, meeting 90% of patient needs Electrolytes in TPN: no additional Add MVI, trace elements Mild ACHS SSI and adjust as needed LR IVMF at 25 ml/hr Monitor TPN labs:  BMP + mag, phos daily x3 days, then twice weekly F/U tomorrow am  Dallie Piles, PharmD 07/02/2018,1:32 PM

## 2018-07-02 NOTE — Progress Notes (Signed)
Nutrition Follow Up Note   DOCUMENTATION CODES:   Not applicable  INTERVENTION:   Advance Clinimix 5/15 with electrolytes to goal rate of 28ml/hr   Continue 20% lipids @20ml /hr x 12 hrs/day    Regimen provides 1758kcal/day, 90g/day protein, 2053ml volume    Continue MVI daily   Continue trace elements daily    Monitor P, K and Mg daily until labs stable   Daily weights  Decrease IVF rate to 57ml/hr   NUTRITION DIAGNOSIS:   Inadequate oral intake related to acute illness as evidenced by other (comment)(pt on NPO/clear liquid diet for 7 days).  GOAL:   Patient will meet greater than or equal to 90% of their needs  -progressing  MONITOR:   Diet advancement, Labs, Weight trends, Skin, I & O's, Other (Comment)(TPN)  ASSESSMENT:   72 y/o female with h/o COPD, partial colectomy with end colostomy and mucous fistula on 04/15/2017. Recent parastomal hernia leading to end ileostomy creation on 12/03/2017, now s/p colostomy takedown with ileocolostomy and parastomal hernia repair 6/28 complicated by post op ileus and HCAP   Pt tolerating TPN; P and K low today and has been supplemented. Recommend increase TPN to goal rate. Pt continues to be NPO. NGT in place with 1563ml output. Recommend continue TPN until pt is able to meet 60% of her needs via oral intake. Will order daily weights.   Medications reviewed and include: lovenox, protonix, cefepime, LRS @100ml /hr  Labs reviewed: K 2.9(L), P 2.4(L), Mg 1.8 wnl Triglycerides- 170(H)- 10/2  Diet Order:   Diet Order            Diet NPO time specified Except for: Ice Chips  Diet effective now             EDUCATION NEEDS:   Education needs have been addressed  Skin:  Skin Assessment: Reviewed RN Assessment(Incision abdomen )  Last BM:  9/26  Height:   Ht Readings from Last 1 Encounters:  06/25/18 5\' 4"  (1.626 m)    Weight:   Wt Readings from Last 1 Encounters:  06/30/18 76.1 kg    Ideal Body Weight:  54.5  kg  BMI:  Body mass index is 28.8 kg/m.  Estimated Nutritional Needs:   Kcal:  1600-1800kcal/day   Protein:  77-92g/day   Fluid:  >1.6L/day   Koleen Distance MS, RD, LDN Pager #- [phone] Office#- [phone] After Hours Pager: [phone]

## 2018-07-02 NOTE — Progress Notes (Signed)
PT Cancellation Note  Patient Details Name: Kimberly [name] MRN: [id number] DOB: 1946/02/15   Cancelled Treatment:    Reason Eval/Treat Not Completed: Patient at procedure or test/unavailable(Consult received for re-attempt at patient evaluation.  Patient now preparing to exit room for diagnostic testing (CT abdomen); will continue efforts at later time/date as patietn returned and medically appropriate.)   Koby Hartfield H. Owens Shark, PT, DPT, NCS 07/02/18, 10:35 AM [phone]

## 2018-07-02 NOTE — Progress Notes (Signed)
[facility] NAME: Kimberly [name]    MR#:  [id number]  DATE OF BIRTH:  01/07/1946  SUBJECTIVE:  complains of cough and abdominal pain. She appears weak and tired. No family in the room.`  REVIEW OF SYSTEMS:   Review of Systems  Constitutional: Positive for malaise/fatigue. Negative for chills, fever and weight loss.  HENT: Negative for ear discharge, ear pain and nosebleeds.   Eyes: Negative for blurred vision, pain and discharge.  Respiratory: Negative for sputum production, shortness of breath, wheezing and stridor.   Cardiovascular: Negative for chest pain, palpitations, orthopnea and PND.  Gastrointestinal: Positive for abdominal pain. Negative for diarrhea, nausea and vomiting.  Genitourinary: Negative for frequency and urgency.  Musculoskeletal: Negative for back pain and joint pain.  Neurological: Positive for weakness. Negative for sensory change, speech change and focal weakness.  Psychiatric/Behavioral: Negative for depression and hallucinations. The patient is not nervous/anxious.    Tolerating Diet: NPO Tolerating PT: ambulating  DRUG ALLERGIES:   Allergies  Allergen Reactions  . Betadine [Povidone Iodine] Other (See Comments)    Topical iodine she states "if you put it in an open sore it will cause a eating ulcer."  . Other Nausea And Vomiting    Antihystamines  . Sinus Formula [Cholestatin] Nausea Only    VITALS:  Blood pressure (!) 156/77, pulse 97, temperature 98.5 F (36.9 C), temperature source Oral, resp. rate (!) 22, height 5\' 4"  (1.626 m), weight 76.1 kg, SpO2 93 %.  PHYSICAL EXAMINATION:   Physical Exam  Abdominal: She exhibits distension.    GENERAL:  72 y.o.-year-old patient lying in the bed with no acute distress.  EYES: Pupils equal, round, reactive to light and accommodation. No scleral icterus. Extraocular muscles intact.  HEENT: Head atraumatic, normocephalic. Oropharynx and  nasopharynx clear. NG+ NECK:  Supple, no jugular venous distention. No thyroid enlargement, no tenderness.  LUNGS: Normal breath sounds bilaterally, no wheezing, rales, rhonchi. No use of accessory muscles of respiration.  CARDIOVASCULAR: S1, S2 normal. No murmurs, rubs, or gallops.  ABDOMEN: Soft, nontender, ++ distended. Bowel sounds absent. No organomegaly or mass.  EXTREMITIES: No cyanosis, clubbing or edema b/l.    NEUROLOGIC: Cranial nerves II through XII are intact. No focal Motor or sensory deficits b/l.   PSYCHIATRIC:  patient is alert and oriented x 3.  SKIN: No obvious rash, lesion, or ulcer.   LABORATORY PANEL:  CBC Recent Labs  Lab 07/02/18 0458  WBC 13.1*  HGB 10.6*  HCT 30.3*  PLT 328    Chemistries  Recent Labs  Lab 06/26/18 0452  07/02/18 0458  NA 139   < > 136  K 4.1   < > 2.9*  CL 103   < > 100  CO2 27   < > 28  GLUCOSE 131*   < > 133*  BUN 10   < > 11  CREATININE 0.78   < > 0.44  CALCIUM 8.2*   < > 8.0*  MG 1.8   < > 1.8  AST 19  --   --   ALT 12  --   --   ALKPHOS 56  --   --   BILITOT 0.7  --   --    < > = values in this interval not displayed.   Cardiac Enzymes No results for input(s): TROPONINI in the last 168 hours. RADIOLOGY:  Dg Chest 2 View  Result Date: 07/01/2018 CLINICAL DATA:  Fever, cough,  former smoking history EXAM: CHEST - 2 VIEW COMPARISON:  Chest x-ray of 06/28/2017 FINDINGS: A passage of remains at both lung bases some which may be due to atelectasis. However, there does appear to be a small left pleural effusion and developing pneumonia cannot be excluded. Right-sided central venous line tip overlies the mid SVC. No pneumothorax is seen. NG tube extends into the stomach. Heart size is stable. IMPRESSION: 1. Bibasilar opacities left-greater-than-right consistent with effusion, atelectasis, but pneumonia cannot be excluded. 2. Right PICC line tip extends to the mid SVC. Electronically Signed   By: Ivar Drape M.D.   On: 07/01/2018  15:13   Dg Abd 1 View  Result Date: 07/02/2018 CLINICAL DATA:  Low-grade fevers and tachycardia EXAM: ABDOMEN - 1 VIEW COMPARISON:  07/01/2018 FINDINGS: Nasogastric catheter is again noted within the stomach. Scattered large and small bowel gas is noted primarily within the small bowel. Persistent small bowel dilatation is noted with some loops measuring approximately 6.8 cm. No free air is seen. No other focal abnormality is noted. Postsurgical changes in the left hip are noted. IMPRESSION: Changes again most consistent with small bowel obstruction. Electronically Signed   By: Inez Catalina M.D.   On: 07/02/2018 08:31   Dg Abd 1 View  Result Date: 07/01/2018 CLINICAL DATA:  Abdominal distention EXAM: ABDOMEN - 1 VIEW COMPARISON:  06/30/2018; 06/27/2018; 12/02/2017; abdominal CT-05/20/2018 FINDINGS: Enteric tube tip and side port overlies the expected location of the gastric fundus. There is moderate gaseous distention multiple loops of small bowel with index loop of small bowel within the left mid hemiabdomen measuring approximately 5.9 cm in diameter. This finding is associated with a conspicuous paucity of distal colonic gas. No supine evidence of pneumoperitoneum. No pneumatosis or portal venous gas Limited visualization of the lower thorax demonstrates left basilar heterogeneous/consolidative opacities. Calcified phleboliths/diverticuli overlies the left hemipelvis. Skin staples overlie the midline of the right side of the abdomen as well as the right groin. Post dynamic screw fixation of the left femoral neck with displaced ossicle adjacent to the superior aspect of the greater trochanter unchanged. IMPRESSION: Similar findings worrisome for small bowel obstruction. Electronically Signed   By: Sandi Mariscal M.D.   On: 07/01/2018 08:25   Dg Abd 1 View  Result Date: 06/30/2018 CLINICAL DATA:  NG tube placement EXAM: ABDOMEN - 1 VIEW COMPARISON:  Earlier study of 06/30/2018 FINDINGS: Nasogastric tube  coiled in proximal stomach. Persistent gaseous dilatation of small bowel loops in the upper and mid abdomen. Persistent atelectasis versus infiltrate and small effusion at LEFT lung base. Close demineralized. IMPRESSION: Nasogastric tube tip at the proximal stomach. Persistent small bowel dilatation. Electronically Signed   By: Lavonia Dana M.D.   On: 06/30/2018 15:01   Ct Abdomen Pelvis W Contrast  Result Date: 07/02/2018 CLINICAL DATA:  Status post colectomy for colon cancer with colostomy takedown. Now with umbilical pain. Elevated white blood cell count EXAM: CT ABDOMEN AND PELVIS WITH CONTRAST TECHNIQUE: Multidetector CT imaging of the abdomen and pelvis was performed using the standard protocol following bolus administration of intravenous contrast. CONTRAST:  148mL ISOVUE-300 IOPAMIDOL (ISOVUE-300) INJECTION 61% COMPARISON:  05/20/2018 FINDINGS: Lower chest: Airspace consolidation and atelectasis noted within both lower lobes. Small left pleural effusion. Hepatobiliary: No focal liver abnormality. The gallbladder appears normal. No biliary dilatation. Pancreas: Unremarkable. No pancreatic ductal dilatation or surrounding inflammatory changes. Spleen: Normal in size without focal abnormality. Adrenals/Urinary Tract: Bilateral adrenal gland masses are again noted. Previously characterized as benign adenomas. These  appear unchanged from previous exam. Unremarkable appearance of both kidneys. No hydronephrosis. Gas is identified within the urinary bladder. Stomach/Bowel: There has been interval postoperative changes from colostomy takedown with ileo colonic anastomosis. There are dilated loops of small bowel which measure up to 5.6 cm. Findings may reflect postoperative ileus versus partial obstruction. Vascular/Lymphatic: Aortic atherosclerosis. No aneurysm. No abdominopelvic adenopathy. Reproductive: Uterus and bilateral adnexa are unremarkable. Other: Multifocal inter loop fluid collections are identified  within the abdomen and pelvis. The largest is in the left hemiabdomen and extends into the pelvis, image 43/5. A well-defined enhancing capsule is not associated with most of these fluid collections. Along the dome of the bladder there is a fluid collection which does have an enhancing rim which measures 5.8 by 3.1 by 3.2 cm. Diffuse edema within the mesentery and peritoneal cavity is identified compatible with postop and/or inflammation. Within the ventral abdominal wall there is a small fluid could collection containing a small amount of gas which measures 3.7 by 1.8 cm, image 51/2. Musculoskeletal: No acute or significant osseous findings. Degenerative disc disease noted within the lower thoracic and upper lumbar spine. No aggressive bone lesions. IMPRESSION: 1. Postoperative changes from ileostomy reversal with enterocolonic anastomosis. There are dilated loops of small bowel identified within the abdomen which may reflect postoperative ileus versus partial obstruction secondary to adhesions. 2. Multiple interloop fluid collections are identified within the abdomen or pelvis. Cannot rule out multiple early abscess formation. There is also a diffuse edema within the mesentery and peritoneal cavity. This may reflect postoperative change and/or peritonitis. 3. Small periumbilical ventral abdominal wall fluid collection is noted measuring up to 3.7 cm. Abscess not excluded. Electronically Signed   By: Kerby Moors M.D.   On: 07/02/2018 11:37   US Venous Img Lower Bilateral  Result Date: 07/01/2018 CLINICAL DATA:  Pain and swelling of the lower extremities EXAM: BILATERAL LOWER EXTREMITY VENOUS DOPPLER ULTRASOUND TECHNIQUE: Gray-scale sonography with graded compression, as well as color Doppler and duplex ultrasound were performed to evaluate the lower extremity deep venous systems from the level of the common femoral vein and including the common femoral, femoral, profunda femoral, popliteal and calf veins  including the posterior tibial, peroneal and gastrocnemius veins when visible. The superficial great saphenous vein was also interrogated. Spectral Doppler was utilized to evaluate flow at rest and with distal augmentation maneuvers in the common femoral, femoral and popliteal veins. COMPARISON:  None. FINDINGS: RIGHT LOWER EXTREMITY Common Femoral Vein: No evidence of thrombus. Normal compressibility, respiratory phasicity and response to augmentation. Saphenofemoral Junction: No evidence of thrombus. Normal compressibility and flow on color Doppler imaging. Profunda Femoral Vein: No evidence of thrombus. Normal compressibility and flow on color Doppler imaging. Femoral Vein: No evidence of thrombus. Normal compressibility, respiratory phasicity and response to augmentation. Popliteal Vein: No evidence of thrombus. Normal compressibility, respiratory phasicity and response to augmentation. Calf Veins: No evidence of thrombus. Normal compressibility and flow on color Doppler imaging. Superficial Great Saphenous Vein: No evidence of thrombus. Normal compressibility. Venous Reflux:  None. Other Findings:  None. LEFT LOWER EXTREMITY Common Femoral Vein: No evidence of thrombus. Normal compressibility, respiratory phasicity and response to augmentation. Saphenofemoral Junction: No evidence of thrombus. Normal compressibility and flow on color Doppler imaging. Profunda Femoral Vein: No evidence of thrombus. Normal compressibility and flow on color Doppler imaging. Femoral Vein: No evidence of thrombus. Normal compressibility, respiratory phasicity and response to augmentation. Popliteal Vein: No evidence of thrombus. Normal compressibility, respiratory phasicity and response to augmentation.  Calf Veins: No evidence of thrombus. Normal compressibility and flow on color Doppler imaging. Superficial Great Saphenous Vein: No evidence of thrombus. Normal compressibility. Venous Reflux:  None. Other Findings:  None.  IMPRESSION: No significant DVT in either lower extremity. Electronically Signed   By: Jerilynn Mages.  Shick M.D.   On: 07/01/2018 14:50   [place] Ekg Site Rite  Result Date: 07/01/2018 If Site Rite image not attached, placement could not be confirmed due to current cardiac rhythm.  ASSESSMENT AND PLAN:  *Status post ileostomy takedown NG tube replaced, x-rays noted for ileus versus obstruction, general surgery managing--CT abdomen today  *Acute recurrent persistent low-grade fevers Check fever work-up with two-view chest x-ray-- mild atelectasis and minimal bibasilar opacity - repeat blood cultures negative - encourage incentive spirometer -  lower extremity Dopplers negative  *Acute hospital-acquired pneumonia Continue pneumonia protocol, empiric cefepime, cultures negative to date  *Acute recurrent hypoxic respiratory failure Likely secondary to pneumonia Scheduled breathing treatments, supplemental oxygen wean as tolerated, encourage senna spirometer, for 2 view chest x-ray, mucolytic agents   *COPD with acute exacerbation, mild  Aggressive pulmonary toilet with bronchodilator therapy, inhaled corticosteroids twice daily, supplemental oxygen wean as tolerated, will hold off on IV Solu-Medrol at this time, and continue close medical monitoring  d/w Zach--surgery PA  Case discussed with Care Management/Social Worker. Management plans discussed with the patient, family and they are in agreement.  CODE STATUS: full  DVT Prophylaxis: lovenox  TOTAL TIME TAKING CARE OF THIS PATIENT: *30* minutes.  >50% time spent on counselling and coordination of care  POSSIBLE D/C IN *few* DAYS, DEPENDING ON CLINICAL CONDITION.  Note: This dictation was prepared with Dragon dictation along with smaller phrase technology. Any transcriptional errors that result from this process are unintentional.  Fritzi Mandes M.D on 07/02/2018 at 12:57 PM  Between 7am to 6pm - Pager - [phone]  After 6pm go to  [email or web address] - password [email or web address] [facility]  Office  [phone]  CC: Primary care physician; Birdie Sons, MDPatient ID: Kimberly [name], female   DOB: 09-05-1946, 72 y.o.   MRN: [id number]

## 2018-07-03 LAB — CBC
HEMATOCRIT: 30.6 % — AB (ref 35.0–47.0)
Hemoglobin: 10.6 g/dL — ABNORMAL LOW (ref 12.0–16.0)
MCH: 33.4 pg (ref 26.0–34.0)
MCHC: 34.7 g/dL (ref 32.0–36.0)
MCV: 96.2 fL (ref 80.0–100.0)
Platelets: 386 10*3/uL (ref 150–440)
RBC: 3.19 MIL/uL — ABNORMAL LOW (ref 3.80–5.20)
RDW: 13.8 % (ref 11.5–14.5)
WBC: 14.1 10*3/uL — AB (ref 3.6–11.0)

## 2018-07-03 LAB — MAGNESIUM: Magnesium: 2 mg/dL (ref 1.7–2.4)

## 2018-07-03 LAB — GLUCOSE, CAPILLARY
GLUCOSE-CAPILLARY: 139 mg/dL — AB (ref 70–99)
GLUCOSE-CAPILLARY: 123 mg/dL — AB (ref 70–99)
Glucose-Capillary: 145 mg/dL — ABNORMAL HIGH (ref 70–99)

## 2018-07-03 LAB — BASIC METABOLIC PANEL
ANION GAP: 12 (ref 5–15)
BUN: 12 mg/dL (ref 8–23)
CHLORIDE: 96 mmol/L — AB (ref 98–111)
CO2: 28 mmol/L (ref 22–32)
Calcium: 7.8 mg/dL — ABNORMAL LOW (ref 8.9–10.3)
Creatinine, Ser: 0.46 mg/dL (ref 0.44–1.00)
GFR calc non Af Amer: >60 mL/min (ref >60)
Glucose, Bld: 132 mg/dL — ABNORMAL HIGH (ref 70–99)
Potassium: 3 mmol/L — ABNORMAL LOW (ref 3.5–5.1)
Sodium: 136 mmol/L (ref 135–145)

## 2018-07-03 LAB — PHOSPHORUS: PHOSPHORUS: 3.1 mg/dL (ref 2.5–4.6)

## 2018-07-03 MED ORDER — POTASSIUM CHLORIDE 10 MEQ/100ML IV SOLN
10.0000 meq | INTRAVENOUS | Status: AC
  Administered 2018-07-03 (×6): 10 meq via INTRAVENOUS
  Filled 2018-07-03 (×6): qty 100

## 2018-07-03 MED ORDER — SODIUM CHLORIDE 0.9 % IV SOLN
INTRAVENOUS | Status: DC | PRN
  Administered 2018-07-03: 250 mL via INTRAVENOUS

## 2018-07-03 MED ORDER — TRACE MINERALS CR-CU-MN-SE-ZN 10-1000-500-60 MCG/ML IV SOLN
INTRAVENOUS | Status: AC
  Administered 2018-07-03: 18:00:00 via INTRAVENOUS
  Filled 2018-07-03: qty 1800

## 2018-07-03 MED ORDER — FAT EMULSION PLANT BASED 20 % IV EMUL
250.0000 mL | INTRAVENOUS | Status: AC
  Administered 2018-07-03: 250 mL via INTRAVENOUS
  Filled 2018-07-03: qty 250

## 2018-07-03 NOTE — Care Management Important Message (Signed)
Copy of signed IM left with patient in room.  

## 2018-07-03 NOTE — Progress Notes (Signed)
[facility] CONSULT NOTE   Pharmacy Consult for TPN Indication: post-op ileus  Patient Measurements: Height: 5\' 4"  (162.6 cm) Weight: 165 lb 5.5 oz (75 kg) IBW/kg (Calculated) : 54.7 TPN AdjBW (KG): 60.2 Body mass index is 28.38 kg/m.  Assessment: POD8, NGT in place for persistent distension and possible ileus on KUB not showing improvement. She has a h/o COPD, partial colectomy with end colostomy and mucous fistula on 04/15/2017. Recent parastomal hernia leading to end ileostomy creation on 12/03/2017, now s/p colostomy takedown with ileocolostomy and parastomal hernia repair 5/85 complicated by post op ileus and HCAP   GI: PPI, TG 170 Endo: no h/o DM Insulin requirements in the past 24 hours: none Lytes:  10/3: K 2.9, P 2.4, Mg 1.8 wnl Replacing with: IV KCl 25mEq, potassium phosphate 500mg  q4h x2 10/4: K 3.0, P 3.1 wnl, Mg 2.0 wnl Replacing with: IV KCl 55mEq Renal: SCr<1 Pulm: Cards:  Hepatobil: Neuro: FY:TWKM PCR (-) WBC 18>22.6>18>14>13.2>10.5 9/28 BCx pending Cefepime 9/29>>       For HCAP Flagyl 10/3>>              For intra-abdominal infection  TPN Access: 07/01/18 TPN start date: 07/01/18 Nutritional Goals (per RD recommendation on 10/02): KCal: 1758 kcal Protein: 90g Fluid:2013ml  Goal TPN rate is 75 ml/hr  Current Nutrition:   Plan:  Advance E5/15 TPN to 75mL/hr. Resume 20% lipid emulsion at 26ml/h for 12 hours/day This TPN provides 90 g of protein, 270 g of dextrose, and 48 g of lipids which provides 961 kCals per day, meeting 90% of patient needs Electrolytes in TPN: no additional Add MVI, trace elements Mild ACHS SSI and adjust as needed LR IVMF at 25 ml/hr Monitor TPN labs:  BMP + mag, phos daily x3 days, then twice weekly F/U tomorrow am  Dallie Piles, PharmD 07/03/2018,9:46 AM

## 2018-07-03 NOTE — Evaluation (Signed)
Physical Therapy Evaluation Patient Details Name: Kimberly [name] MRN: [id number] DOB: Feb 09, 1946 Today's Date: 07/03/2018   History of Present Illness  admitted for acute hospitalization after procedure for colostomy takedown and parastomal hernia repair (9/26); post-op course complicated by acute hypoxic respiratory failure with PNA, ileus.  Clinical Impression  Upon evaluation, patient alert and oriented; follows commands and demonstrates good insight/safety awareness.  Eager/agreeable for OOB activities with therapist.  Bilat UE/LE strength and ROM Grossly symmetrical and WFL; appropriate for basic transfers and ambulation.  Currently requiring min assist for bed mobility; min assist for sit/stand, basic transfers and gait (180') with RW.  Slow, but steady, cadence and overall gait performance; no overt buckling or LOB. Mild SOB with exertion; mild desat 88% with exertion, recovering to >90% within 30 seconds of seated rest. Would benefit from skilled PT to address above deficits and promote optimal return to PLOF; Recommend transition to [place] upon discharge from acute hospitalization.  Of note, RN disconnected/reconnected NGT as appropriate to allow for participation with session.    Follow Up Recommendations Home health PT    Equipment Recommendations  Rolling walker with 5" wheels    Recommendations for Other Services       Precautions / Restrictions Precautions Precautions: Fall Precaution Comments: NPO, NGT Restrictions Weight Bearing Restrictions: No      Mobility  Bed Mobility Overal bed mobility: Needs Assistance Bed Mobility: Supine to Sit     Supine to sit: Min assist     General bed mobility comments: for truncal elevation  Transfers Overall transfer level: Needs assistance Equipment used: Rolling walker (2 wheeled) Transfers: Sit to/from Stand Sit to Stand: Min assist;Min guard         General transfer comment: cuing for hand  placement  Ambulation/Gait Ambulation/Gait assistance: Min assist Gait Distance (Feet): 180 Feet Assistive device: Rolling walker (2 wheeled)       General Gait Details: reciprocal stepping pattern with slow, guarded, cadence, but no overt buckling or LOB.  Min reliance on RW, primarily for energy conservation.  Stairs            Wheelchair Mobility    Modified Rankin (Stroke Patients Only)       Balance Overall balance assessment: Needs assistance Sitting-balance support: No upper extremity supported;Feet supported Sitting balance-Leahy Scale: Good     Standing balance support: Bilateral upper extremity supported Standing balance-Leahy Scale: Fair                               Pertinent Vitals/Pain Pain Assessment: 0-10 Pain Score: 5  Pain Location: abdomen Pain Descriptors / Indicators: Aching;Grimacing;Guarding Pain Intervention(s): Limited activity within patient's tolerance;Monitored during session;Repositioned    Home Living Family/patient expects to be discharged to:: Private residence Living Arrangements: Alone Available Help at Discharge: Family;Available PRN/intermittently Type of Home: House Home Access: Level entry     Home Layout: One level Home Equipment: None      Prior Function Level of Independence: Independent         Comments: Indep with ADLs, household and community mobilizaiton; + driving; denies fall history; no home O2.     Hand Dominance   Dominant Hand: Right    Extremity/Trunk Assessment   Upper Extremity Assessment Upper Extremity Assessment: Overall WFL for tasks assessed    Lower Extremity Assessment Lower Extremity Assessment: Overall WFL for tasks assessed(grossly 4-/5 throughout bilat LEs)       Communication  Communication: No difficulties  Cognition Arousal/Alertness: Awake/alert Behavior During Therapy: WFL for tasks assessed/performed Overall Cognitive Status: Within Functional Limits  for tasks assessed                                        General Comments      Exercises Other Exercises Other Exercises: Toilet transfer, ambulatory with RW, cga/min assist; sit/stand from standard toilet, cga/min assist with grab bar.   Assessment/Plan    PT Assessment Patient needs continued PT services  PT Problem List Decreased mobility;Decreased strength;Decreased activity tolerance;Decreased balance;Cardiopulmonary status limiting activity;Decreased skin integrity;Pain       PT Treatment Interventions DME instruction;Gait training;Functional mobility training;Therapeutic activities;Therapeutic exercise;Balance training;Patient/family education    PT Goals (Current goals can be found in the Care Plan section)  Acute Rehab PT Goals Patient Stated Goal: to return home PT Goal Formulation: With patient Time For Goal Achievement: 07/17/18 Potential to Achieve Goals: Good    Frequency Min 2X/week   Barriers to discharge Decreased caregiver support      Co-evaluation               AM-PAC PT "6 Clicks" Daily Activity  Outcome Measure Difficulty turning over in bed (including adjusting bedclothes, sheets and blankets)?: Unable Difficulty moving from lying on back to sitting on the side of the bed? : Unable Difficulty sitting down on and standing up from a chair with arms (e.g., wheelchair, bedside commode, etc,.)?: Unable Help needed moving to and from a bed to chair (including a wheelchair)?: A Little Help needed walking in hospital room?: A Little Help needed climbing 3-5 steps with a railing? : A Little 6 Click Score: 12    End of Session Equipment Utilized During Treatment: Gait belt;Oxygen Activity Tolerance: Patient tolerated treatment well Patient left: in chair;with call bell/phone within reach;with chair alarm set Nurse Communication: Mobility status PT Visit Diagnosis: Muscle weakness (generalized) (M62.81);Difficulty in walking, not  elsewhere classified (R26.2)    Time: 3007-6226 PT Time Calculation (min) (ACUTE ONLY): 27 min   Charges:   PT Evaluation $PT Eval Moderate Complexity: 1 Mod PT Treatments $Therapeutic Activity: 8-22 mins        Margurete Guaman H. Owens Shark, PT, DPT, NCS 07/03/18, 9:46 AM [phone]

## 2018-07-03 NOTE — Progress Notes (Signed)
[facility] [place] NAME: Feiga Nadel    MR#:  [id number]  DATE OF BIRTH:  15-Feb-1946  SUBJECTIVE:  complains of cough and abdominal pain. She appears weak and tired. No family in the room.` Patient appears a little better today. NG output 900 mL over 24 hours  REVIEW OF SYSTEMS:   Review of Systems  Constitutional: Positive for malaise/fatigue. Negative for chills, fever and weight loss.  HENT: Negative for ear discharge, ear pain and nosebleeds.   Eyes: Negative for blurred vision, pain and discharge.  Respiratory: Negative for sputum production, shortness of breath, wheezing and stridor.   Cardiovascular: Negative for chest pain, palpitations, orthopnea and PND.  Gastrointestinal: Positive for abdominal pain. Negative for diarrhea, nausea and vomiting.  Genitourinary: Negative for frequency and urgency.  Musculoskeletal: Negative for back pain and joint pain.  Neurological: Positive for weakness. Negative for sensory change, speech change and focal weakness.  Psychiatric/Behavioral: Negative for depression and hallucinations. The patient is not nervous/anxious.    Tolerating Diet: NPO Tolerating PT: ambulating--HHPT  DRUG ALLERGIES:   Allergies  Allergen Reactions  . Betadine [Povidone Iodine] Other (See Comments)    Topical iodine she states "if you put it in an open sore it will cause a eating ulcer."  . Other Nausea And Vomiting    Antihystamines  . Sinus Formula [Cholestatin] Nausea Only    VITALS:  Blood pressure (!) 141/79, pulse 88, temperature 98.2 F (36.8 C), temperature source Oral, resp. rate (!) 22, height 5\' 4"  (1.626 m), weight 75 kg, SpO2 (!) 88 %.  PHYSICAL EXAMINATION:   Physical Exam  Abdominal: She exhibits distension.    GENERAL:  72 y.o.-year-old patient lying in the bed with no acute distress.  EYES: Pupils equal, round, reactive to light and accommodation. No scleral icterus. Extraocular  muscles intact.  HEENT: Head atraumatic, normocephalic. Oropharynx and nasopharynx clear. NG+ NECK:  Supple, no jugular venous distention. No thyroid enlargement, no tenderness.  LUNGS: Normal breath sounds bilaterally, no wheezing, rales, rhonchi. No use of accessory muscles of respiration.  CARDIOVASCULAR: S1, S2 normal. No murmurs, rubs, or gallops.  ABDOMEN: Soft, nontender, ++ distended. Bowel sounds absent. No organomegaly or mass.  EXTREMITIES: No cyanosis, clubbing or edema b/l.    NEUROLOGIC: Cranial nerves II through XII are intact. No focal Motor or sensory deficits b/l.   PSYCHIATRIC:  patient is alert and oriented x 3.  SKIN: No obvious rash, lesion, or ulcer.   LABORATORY PANEL:  CBC Recent Labs  Lab 07/03/18 0333  WBC 14.1*  HGB 10.6*  HCT 30.6*  PLT 386    Chemistries  Recent Labs  Lab 07/03/18 0333  NA 136  K 3.0*  CL 96*  CO2 28  GLUCOSE 132*  BUN 12  CREATININE 0.46  CALCIUM 7.8*  MG 2.0   Cardiac Enzymes No results for input(s): TROPONINI in the last 168 hours. RADIOLOGY:  Dg Chest 2 View  Result Date: 07/01/2018 CLINICAL DATA:  Fever, cough, former smoking history EXAM: CHEST - 2 VIEW COMPARISON:  Chest x-ray of 06/28/2017 FINDINGS: A passage of remains at both lung bases some which may be due to atelectasis. However, there does appear to be a small left pleural effusion and developing pneumonia cannot be excluded. Right-sided central venous line tip overlies the mid SVC. No pneumothorax is seen. NG tube extends into the stomach. Heart size is stable. IMPRESSION: 1. Bibasilar opacities left-greater-than-right consistent with effusion, atelectasis, but pneumonia  cannot be excluded. 2. Right PICC line tip extends to the mid SVC. Electronically Signed   By: Ivar Drape M.D.   On: 07/01/2018 15:13   Dg Abd 1 View  Result Date: 07/02/2018 CLINICAL DATA:  Low-grade fevers and tachycardia EXAM: ABDOMEN - 1 VIEW COMPARISON:  07/01/2018 FINDINGS: Nasogastric  catheter is again noted within the stomach. Scattered large and small bowel gas is noted primarily within the small bowel. Persistent small bowel dilatation is noted with some loops measuring approximately 6.8 cm. No free air is seen. No other focal abnormality is noted. Postsurgical changes in the left hip are noted. IMPRESSION: Changes again most consistent with small bowel obstruction. Electronically Signed   By: Inez Catalina M.D.   On: 07/02/2018 08:31   Ct Abdomen Pelvis W Contrast  Result Date: 07/02/2018 CLINICAL DATA:  Status post colectomy for colon cancer with colostomy takedown. Now with umbilical pain. Elevated white blood cell count EXAM: CT ABDOMEN AND PELVIS WITH CONTRAST TECHNIQUE: Multidetector CT imaging of the abdomen and pelvis was performed using the standard protocol following bolus administration of intravenous contrast. CONTRAST:  11mL ISOVUE-300 IOPAMIDOL (ISOVUE-300) INJECTION 61% COMPARISON:  05/20/2018 FINDINGS: Lower chest: Airspace consolidation and atelectasis noted within both lower lobes. Small left pleural effusion. Hepatobiliary: No focal liver abnormality. The gallbladder appears normal. No biliary dilatation. Pancreas: Unremarkable. No pancreatic ductal dilatation or surrounding inflammatory changes. Spleen: Normal in size without focal abnormality. Adrenals/Urinary Tract: Bilateral adrenal gland masses are again noted. Previously characterized as benign adenomas. These appear unchanged from previous exam. Unremarkable appearance of both kidneys. No hydronephrosis. Gas is identified within the urinary bladder. Stomach/Bowel: There has been interval postoperative changes from colostomy takedown with ileo colonic anastomosis. There are dilated loops of small bowel which measure up to 5.6 cm. Findings may reflect postoperative ileus versus partial obstruction. Vascular/Lymphatic: Aortic atherosclerosis. No aneurysm. No abdominopelvic adenopathy. Reproductive: Uterus and  bilateral adnexa are unremarkable. Other: Multifocal inter loop fluid collections are identified within the abdomen and pelvis. The largest is in the left hemiabdomen and extends into the pelvis, image 43/5. A well-defined enhancing capsule is not associated with most of these fluid collections. Along the dome of the bladder there is a fluid collection which does have an enhancing rim which measures 5.8 by 3.1 by 3.2 cm. Diffuse edema within the mesentery and peritoneal cavity is identified compatible with postop and/or inflammation. Within the ventral abdominal wall there is a small fluid could collection containing a small amount of gas which measures 3.7 by 1.8 cm, image 51/2. Musculoskeletal: No acute or significant osseous findings. Degenerative disc disease noted within the lower thoracic and upper lumbar spine. No aggressive bone lesions. IMPRESSION: 1. Postoperative changes from ileostomy reversal with enterocolonic anastomosis. There are dilated loops of small bowel identified within the abdomen which may reflect postoperative ileus versus partial obstruction secondary to adhesions. 2. Multiple interloop fluid collections are identified within the abdomen or pelvis. Cannot rule out multiple early abscess formation. There is also a diffuse edema within the mesentery and peritoneal cavity. This may reflect postoperative change and/or peritonitis. 3. Small periumbilical ventral abdominal wall fluid collection is noted measuring up to 3.7 cm. Abscess not excluded. Electronically Signed   By: Kerby Moors M.D.   On: 07/02/2018 11:37   US Venous Img Lower Bilateral  Result Date: 07/01/2018 CLINICAL DATA:  Pain and swelling of the lower extremities EXAM: BILATERAL LOWER EXTREMITY VENOUS DOPPLER ULTRASOUND TECHNIQUE: Gray-scale sonography with graded compression, as well as color  Doppler and duplex ultrasound were performed to evaluate the lower extremity deep venous systems from the level of the common  femoral vein and including the common femoral, femoral, profunda femoral, popliteal and calf veins including the posterior tibial, peroneal and gastrocnemius veins when visible. The superficial great saphenous vein was also interrogated. Spectral Doppler was utilized to evaluate flow at rest and with distal augmentation maneuvers in the common femoral, femoral and popliteal veins. COMPARISON:  None. FINDINGS: RIGHT LOWER EXTREMITY Common Femoral Vein: No evidence of thrombus. Normal compressibility, respiratory phasicity and response to augmentation. Saphenofemoral Junction: No evidence of thrombus. Normal compressibility and flow on color Doppler imaging. Profunda Femoral Vein: No evidence of thrombus. Normal compressibility and flow on color Doppler imaging. Femoral Vein: No evidence of thrombus. Normal compressibility, respiratory phasicity and response to augmentation. Popliteal Vein: No evidence of thrombus. Normal compressibility, respiratory phasicity and response to augmentation. Calf Veins: No evidence of thrombus. Normal compressibility and flow on color Doppler imaging. Superficial Great Saphenous Vein: No evidence of thrombus. Normal compressibility. Venous Reflux:  None. Other Findings:  None. LEFT LOWER EXTREMITY Common Femoral Vein: No evidence of thrombus. Normal compressibility, respiratory phasicity and response to augmentation. Saphenofemoral Junction: No evidence of thrombus. Normal compressibility and flow on color Doppler imaging. Profunda Femoral Vein: No evidence of thrombus. Normal compressibility and flow on color Doppler imaging. Femoral Vein: No evidence of thrombus. Normal compressibility, respiratory phasicity and response to augmentation. Popliteal Vein: No evidence of thrombus. Normal compressibility, respiratory phasicity and response to augmentation. Calf Veins: No evidence of thrombus. Normal compressibility and flow on color Doppler imaging. Superficial Great Saphenous Vein: No  evidence of thrombus. Normal compressibility. Venous Reflux:  None. Other Findings:  None. IMPRESSION: No significant DVT in either lower extremity. Electronically Signed   By: Jerilynn Mages.  Shick M.D.   On: 07/01/2018 14:50   ASSESSMENT AND PLAN:    *Status post ileostomy takedown -NG tube replaced, x-rays noted for ileus versus obstruction, general surgery managing ---CT abdomen result noted-- pelvic fluid appears more reactive/inflammatory/peritonitis.?abcess -IV cefepime and Flagyl  *Acute recurrent persistent low-grade fevers -chest x-ray-- mild atelectasis and minimal bibasilar opacity - repeat blood cultures negative - encourage incentive spirometer -  lower extremity Dopplers negative  * hospital-acquired pneumonia Continue pneumonia protocol, empiric cefepime (7/10) - cultures negative to date  *Acute hypoxic respiratory failure Likely secondary to pneumonia Scheduled breathing treatments, supplemental oxygen wean as tolerated, encourage senna spirometer, for 2 view chest x-ray, mucolytic agents  -wean to RA  *COPD with acute exacerbation, mild  -Aggressive pulmonary toilet with bronchodilator therapy, inhaled corticosteroids twice daily, supplemental oxygen wean as tolerated -will hold off on IV Solu-Medrol at this time  *Ambulating well. Physical therapy recommends home health PT  d/w Zach--surgery PA  Case discussed with Care Management/Social Worker. Management plans discussed with the patient, family and they are in agreement.  CODE STATUS: full  DVT Prophylaxis: lovenox  TOTAL TIME TAKING CARE OF THIS PATIENT: *30* minutes.  >50% time spent on counselling and coordination of care  POSSIBLE D/C IN *few* DAYS, DEPENDING ON CLINICAL CONDITION.  Note: This dictation was prepared with Dragon dictation along with smaller phrase technology. Any transcriptional errors that result from this process are unintentional.  Fritzi Mandes M.D on 07/03/2018 at 12:30  PM  Between 7am to 6pm - Pager - [phone]  After 6pm go to [email or web address] - password [email or web address] [facility]  Office  [phone]  CC: Primary care physician; Birdie Sons, MDPatient ID:  [name], female   DOB: 12/24/1945, 72 y.o.   MRN: [id number]

## 2018-07-03 NOTE — Care Management (Signed)
Patient admitted from home. Status post ileostomy takedown.  Patient currently with NG tube, TPN, and acute o2.  Patient does not have a qualifying ICU stay to qualify pursing LTAC. Patient lives home alone.  Daughter lives locally for support, and can assist providing transportation if needed. PCP Lelon Huh. Pharmacy [facility].  Patient denies issues obtaining medication. PT has assessed patient and recommends home health PT. Patient agreeable to services, and does not have a preference of agency.  Per Joelene Millin with [facility] they and open referral for the patient from Dr. Barb Merino office. Patient will need RW prior to discharge, as she does not have any medical equipment in the home.  RNCM following.

## 2018-07-03 NOTE — Progress Notes (Signed)
[facility] Progress Note  8 Days Post-Op  Subjective: No acute events overnight. Abdominal soreness worse over RLQ incision, no nausea or emesis. NGT in place. She continues to endorse a productive cough. No fevers overnight.   Objective: Vital signs in last 24 hours: Temp:  [98.2 F (36.8 C)-98.3 F (36.8 C)] 98.2 F (36.8 C) (10/04 0551) Pulse Rate:  [85-94] 88 (10/04 0551) Resp:  [16-22] 22 (10/04 0551) BP: (140-155)/(70-80) 141/79 (10/04 0551) SpO2:  [93 %-95 %] 95 % (10/04 0720) Weight:  [75 kg] 75 kg (10/04 0500) Last BM Date: 06/25/18  Intake/Output from previous day: 10/03 0701 - 10/04 0700 In: 3102.6 [I.V.:2216.8; IV Piggyback:885.8] Out: 2800 [Urine:1900; Emesis/NG output:900] Intake/Output this shift: No intake/output data recorded.  PE: Gen: Alert, NAD, pleasant HEENT: NGT in place Card:Tachycardicand Regularrhythm, pedal pulses 2+ BL Pulm: Normal effort, on [place], CTAB Abd: Soft,incisional tenderness worse in RLQ incision,improvement in distension again this morning,tympanic to percussion, incisions C/D/Iwithout erythema or drainage Skin: warm and dry, no rashes  Psych: A&Ox3   Lab Results:  Recent Labs    07/02/18 0458 07/03/18 0333  WBC 13.1* 14.1*  HGB 10.6* 10.6*  HCT 30.3* 30.6*  PLT 328 386   BMET Recent Labs    07/02/18 0458 07/03/18 0333  NA 136 136  K 2.9* 3.0*  CL 100 96*  CO2 28 28  GLUCOSE 133* 132*  BUN 11 12  CREATININE 0.44 0.46  CALCIUM 8.0* 7.8*   PT/INR No results for input(s): LABPROT, INR in the last 72 hours. CMP     Component Value Date/Time   NA 136 07/03/2018 0333   NA 141 01/02/2017 1410   NA 136 01/27/2012 0637   K 3.0 (L) 07/03/2018 0333   K 4.3 01/27/2012 0637   CL 96 (L) 07/03/2018 0333   CL 102 01/27/2012 0637   CO2 28 07/03/2018 0333   CO2 25 01/27/2012 0637   GLUCOSE 132 (H) 07/03/2018 0333   GLUCOSE 119 (H) 01/27/2012 0637   BUN 12 07/03/2018 0333   BUN 14 01/02/2017  1410   BUN 15 01/27/2012 0637   CREATININE 0.46 07/03/2018 0333   CREATININE 0.67 01/27/2012 0637   CALCIUM 7.8 (L) 07/03/2018 0333   CALCIUM 7.7 (L) 01/27/2012 0637   PROT 6.7 06/26/2018 0452   PROT 7.0 01/02/2017 1410   PROT 7.6 01/26/2012 0021   ALBUMIN 3.3 (L) 06/26/2018 0452   ALBUMIN 3.9 01/02/2017 1410   ALBUMIN 3.5 01/26/2012 0021   AST 19 06/26/2018 0452   AST 17 01/26/2012 0021   ALT 12 06/26/2018 0452   ALT 19 01/26/2012 0021   ALKPHOS 56 06/26/2018 0452   ALKPHOS 87 01/26/2012 0021   BILITOT 0.7 06/26/2018 0452   BILITOT <0.2 01/02/2017 1410   BILITOT 0.2 01/26/2012 0021   GFRNONAA >60 07/03/2018 0333   GFRNONAA >60 01/27/2012 0637   GFRAA >60 07/03/2018 0333   GFRAA >60 01/27/2012 0637   Lipase     Component Value Date/Time   LIPASE 25 05/03/2017 1247       Studies/Results: Dg Chest 2 View  Result Date: 07/01/2018 CLINICAL DATA:  Fever, cough, former smoking history EXAM: CHEST - 2 VIEW COMPARISON:  Chest x-ray of 06/28/2017 FINDINGS: A passage of remains at both lung bases some which may be due to atelectasis. However, there does appear to be a small left pleural effusion and developing pneumonia cannot be excluded. Right-sided central venous line tip overlies the mid SVC. No pneumothorax is seen.  NG tube extends into the stomach. Heart size is stable. IMPRESSION: 1. Bibasilar opacities left-greater-than-right consistent with effusion, atelectasis, but pneumonia cannot be excluded. 2. Right PICC line tip extends to the mid SVC. Electronically Signed   By: Ivar Drape M.D.   On: 07/01/2018 15:13   Dg Abd 1 View  Result Date: 07/02/2018 CLINICAL DATA:  Low-grade fevers and tachycardia EXAM: ABDOMEN - 1 VIEW COMPARISON:  07/01/2018 FINDINGS: Nasogastric catheter is again noted within the stomach. Scattered large and small bowel gas is noted primarily within the small bowel. Persistent small bowel dilatation is noted with some loops measuring approximately 6.8 cm.  No free air is seen. No other focal abnormality is noted. Postsurgical changes in the left hip are noted. IMPRESSION: Changes again most consistent with small bowel obstruction. Electronically Signed   By: Inez Catalina M.D.   On: 07/02/2018 08:31   Ct Abdomen Pelvis W Contrast  Result Date: 07/02/2018 CLINICAL DATA:  Status post colectomy for colon cancer with colostomy takedown. Now with umbilical pain. Elevated white blood cell count EXAM: CT ABDOMEN AND PELVIS WITH CONTRAST TECHNIQUE: Multidetector CT imaging of the abdomen and pelvis was performed using the standard protocol following bolus administration of intravenous contrast. CONTRAST:  130mL ISOVUE-300 IOPAMIDOL (ISOVUE-300) INJECTION 61% COMPARISON:  05/20/2018 FINDINGS: Lower chest: Airspace consolidation and atelectasis noted within both lower lobes. Small left pleural effusion. Hepatobiliary: No focal liver abnormality. The gallbladder appears normal. No biliary dilatation. Pancreas: Unremarkable. No pancreatic ductal dilatation or surrounding inflammatory changes. Spleen: Normal in size without focal abnormality. Adrenals/Urinary Tract: Bilateral adrenal gland masses are again noted. Previously characterized as benign adenomas. These appear unchanged from previous exam. Unremarkable appearance of both kidneys. No hydronephrosis. Gas is identified within the urinary bladder. Stomach/Bowel: There has been interval postoperative changes from colostomy takedown with ileo colonic anastomosis. There are dilated loops of small bowel which measure up to 5.6 cm. Findings may reflect postoperative ileus versus partial obstruction. Vascular/Lymphatic: Aortic atherosclerosis. No aneurysm. No abdominopelvic adenopathy. Reproductive: Uterus and bilateral adnexa are unremarkable. Other: Multifocal inter loop fluid collections are identified within the abdomen and pelvis. The largest is in the left hemiabdomen and extends into the pelvis, image 43/5. A  well-defined enhancing capsule is not associated with most of these fluid collections. Along the dome of the bladder there is a fluid collection which does have an enhancing rim which measures 5.8 by 3.1 by 3.2 cm. Diffuse edema within the mesentery and peritoneal cavity is identified compatible with postop and/or inflammation. Within the ventral abdominal wall there is a small fluid could collection containing a small amount of gas which measures 3.7 by 1.8 cm, image 51/2. Musculoskeletal: No acute or significant osseous findings. Degenerative disc disease noted within the lower thoracic and upper lumbar spine. No aggressive bone lesions. IMPRESSION: 1. Postoperative changes from ileostomy reversal with enterocolonic anastomosis. There are dilated loops of small bowel identified within the abdomen which may reflect postoperative ileus versus partial obstruction secondary to adhesions. 2. Multiple interloop fluid collections are identified within the abdomen or pelvis. Cannot rule out multiple early abscess formation. There is also a diffuse edema within the mesentery and peritoneal cavity. This may reflect postoperative change and/or peritonitis. 3. Small periumbilical ventral abdominal wall fluid collection is noted measuring up to 3.7 cm. Abscess not excluded. Electronically Signed   By: Kerby Moors M.D.   On: 07/02/2018 11:37   US Venous Img Lower Bilateral  Result Date: 07/01/2018 CLINICAL DATA:  Pain and swelling  of the lower extremities EXAM: BILATERAL LOWER EXTREMITY VENOUS DOPPLER ULTRASOUND TECHNIQUE: Gray-scale sonography with graded compression, as well as color Doppler and duplex ultrasound were performed to evaluate the lower extremity deep venous systems from the level of the common femoral vein and including the common femoral, femoral, profunda femoral, popliteal and calf veins including the posterior tibial, peroneal and gastrocnemius veins when visible. The superficial great saphenous vein  was also interrogated. Spectral Doppler was utilized to evaluate flow at rest and with distal augmentation maneuvers in the common femoral, femoral and popliteal veins. COMPARISON:  None. FINDINGS: RIGHT LOWER EXTREMITY Common Femoral Vein: No evidence of thrombus. Normal compressibility, respiratory phasicity and response to augmentation. Saphenofemoral Junction: No evidence of thrombus. Normal compressibility and flow on color Doppler imaging. Profunda Femoral Vein: No evidence of thrombus. Normal compressibility and flow on color Doppler imaging. Femoral Vein: No evidence of thrombus. Normal compressibility, respiratory phasicity and response to augmentation. Popliteal Vein: No evidence of thrombus. Normal compressibility, respiratory phasicity and response to augmentation. Calf Veins: No evidence of thrombus. Normal compressibility and flow on color Doppler imaging. Superficial Great Saphenous Vein: No evidence of thrombus. Normal compressibility. Venous Reflux:  None. Other Findings:  None. LEFT LOWER EXTREMITY Common Femoral Vein: No evidence of thrombus. Normal compressibility, respiratory phasicity and response to augmentation. Saphenofemoral Junction: No evidence of thrombus. Normal compressibility and flow on color Doppler imaging. Profunda Femoral Vein: No evidence of thrombus. Normal compressibility and flow on color Doppler imaging. Femoral Vein: No evidence of thrombus. Normal compressibility, respiratory phasicity and response to augmentation. Popliteal Vein: No evidence of thrombus. Normal compressibility, respiratory phasicity and response to augmentation. Calf Veins: No evidence of thrombus. Normal compressibility and flow on color Doppler imaging. Superficial Great Saphenous Vein: No evidence of thrombus. Normal compressibility. Venous Reflux:  None. Other Findings:  None. IMPRESSION: No significant DVT in either lower extremity. Electronically Signed   By: Jerilynn Mages.  Shick M.D.   On: 07/01/2018 14:50    [place] Ekg Site Rite  Result Date: 07/01/2018 If Site Rite image not attached, placement could not be confirmed due to current cardiac rhythm.   Anti-infectives: Anti-infectives (From admission, onward)   Start     Dose/Rate Route Frequency Ordered Stop   07/02/18 1200  metroNIDAZOLE (FLAGYL) IVPB 500 mg     500 mg 100 mL/hr over 60 Minutes Intravenous Every 8 hours 07/02/18 1147     06/28/18 0600  vancomycin (VANCOCIN) IVPB 1000 mg/200 mL premix  Status:  Discontinued     1,000 mg 200 mL/hr over 60 Minutes Intravenous Every 12 hours 06/27/18 2358 06/28/18 1700   06/27/18 2245  vancomycin (VANCOCIN) IVPB 1000 mg/200 mL premix     1,000 mg 200 mL/hr over 60 Minutes Intravenous  Once 06/27/18 2243 06/28/18 1100   06/27/18 2215  ceFEPIme (MAXIPIME) 1 g in sodium chloride 0.9 % 100 mL IVPB     1 g 200 mL/hr over 30 Minutes Intravenous Every 8 hours 06/27/18 2207 07/05/18 2159   06/25/18 0600  ertapenem (INVANZ) 1,000 mg in sodium chloride 0.9 % 100 mL IVPB     1 g 200 mL/hr over 30 Minutes Intravenous On call to O.R. 06/24/18 2208 06/25/18 1448       Assessment/Plan   S/P Ileostomy Takedown  - POD8,NGT in place with 900 ccs over last 24 hours out, CT A/P  Yesterday again showed ileus pattern vs obstruction, on review with Dr. Dahlia Byes no concerning findings, there is fluid in the abdomen but most  likely reactive rather than abscess but did add Flagyl to ABx regimen. Unfortunately, this may take time to resolve, her abdomen feels less distended today and she is passing flatus. Will get KUB in AM -Remain NPO, currently on TPN, dietitian following appreciate their help - Hypokalemia, will replete -Continue IVF, monitor UO - Leukocytosis to 14.0 this AM, continue to follow - Encouraged mobilization  HCAP - Cough has improved - Hospitalist following, appreciate their help - Leukocytosisto 14.0 - Continue IV ABx, bronchodilators - Encouraged IS use, aggressive pulmonary toilet -  Does not use O2 at home, wean as tolerates    LOS: 8 days    Edison Simon , PA-C [facility] 07/03/2018, 8:28 AM [phone] M-F: 7am - 4pm

## 2018-07-04 ENCOUNTER — Inpatient Hospital Stay: Payer: Medicare HMO

## 2018-07-04 LAB — CBC
HCT: 34.6 % — ABNORMAL LOW (ref 35.0–47.0)
HEMOGLOBIN: 11.5 g/dL — AB (ref 12.0–16.0)
MCH: 31.8 pg (ref 26.0–34.0)
MCHC: 33.2 g/dL (ref 32.0–36.0)
MCV: 95.8 fL (ref 80.0–100.0)
Platelets: 418 10*3/uL (ref 150–440)
RBC: 3.61 MIL/uL — ABNORMAL LOW (ref 3.80–5.20)
RDW: 13.6 % (ref 11.5–14.5)
WBC: 17.5 10*3/uL — ABNORMAL HIGH (ref 3.6–11.0)

## 2018-07-04 LAB — BASIC METABOLIC PANEL
Anion gap: 12 (ref 5–15)
BUN: 14 mg/dL (ref 8–23)
CHLORIDE: 95 mmol/L — AB (ref 98–111)
CO2: 27 mmol/L (ref 22–32)
CREATININE: 0.31 mg/dL — AB (ref 0.44–1.00)
Calcium: 7.9 mg/dL — ABNORMAL LOW (ref 8.9–10.3)
GFR calc Af Amer: >60 mL/min (ref >60)
Glucose, Bld: 149 mg/dL — ABNORMAL HIGH (ref 70–99)
Potassium: 3.6 mmol/L (ref 3.5–5.1)
SODIUM: 134 mmol/L — AB (ref 135–145)

## 2018-07-04 LAB — GLUCOSE, CAPILLARY
GLUCOSE-CAPILLARY: 129 mg/dL — AB (ref 70–99)
Glucose-Capillary: 130 mg/dL — ABNORMAL HIGH (ref 70–99)
Glucose-Capillary: 147 mg/dL — ABNORMAL HIGH (ref 70–99)
Glucose-Capillary: 149 mg/dL — ABNORMAL HIGH (ref 70–99)

## 2018-07-04 MED ORDER — FAT EMULSION PLANT BASED 20 % IV EMUL
250.0000 mL | INTRAVENOUS | Status: AC
  Administered 2018-07-04: 250 mL via INTRAVENOUS
  Filled 2018-07-04: qty 250

## 2018-07-04 MED ORDER — TRACE MINERALS CR-CU-MN-SE-ZN 10-1000-500-60 MCG/ML IV SOLN
INTRAVENOUS | Status: AC
  Administered 2018-07-04: 18:00:00 via INTRAVENOUS
  Filled 2018-07-04: qty 1800

## 2018-07-04 MED ORDER — FLEET ENEMA 7-19 GM/118ML RE ENEM
1.0000 | ENEMA | Freq: Once | RECTAL | Status: AC
  Administered 2018-07-04: 1 via RECTAL

## 2018-07-04 NOTE — Progress Notes (Signed)
[place] [facility] [place] NAME: Kimberly [name]    MR#:  [id number]  DATE OF BIRTH:  1945/12/10  SUBJECTIVE:  Patient feels better.  Cough is improving.  Denies any shortness of breath  REVIEW OF SYSTEMS:   Review of Systems  Constitutional: Positive for malaise/fatigue. Negative for chills, fever and weight loss.  HENT: Negative for ear discharge, ear pain and nosebleeds.   Eyes: Negative for blurred vision, pain and discharge.  Respiratory: Positive for cough. Negative for sputum production, shortness of breath, wheezing and stridor.   Cardiovascular: Negative for chest pain, palpitations, orthopnea and PND.  Gastrointestinal: Positive for abdominal pain. Negative for diarrhea, nausea and vomiting.  Genitourinary: Negative for frequency and urgency.  Musculoskeletal: Negative for back pain and joint pain.  Neurological: Positive for weakness. Negative for sensory change, speech change and focal weakness.  Psychiatric/Behavioral: Negative for depression and hallucinations. The patient is not nervous/anxious.    Tolerating Diet: NPO Tolerating PT: ambulating--HHPT  DRUG ALLERGIES:   Allergies  Allergen Reactions  . Betadine [Povidone Iodine] Other (See Comments)    Topical iodine she states "if you put it in an open sore it will cause a eating ulcer."  . Other Nausea And Vomiting    Antihystamines  . Sinus Formula [Cholestatin] Nausea Only    VITALS:  Blood pressure 137/69, pulse 66, temperature 98.2 F (36.8 C), temperature source Oral, resp. rate 16, height 5\' 4"  (1.626 m), weight 74.8 kg, SpO2 96 %.  PHYSICAL EXAMINATION:   Physical Exam  Abdominal: She exhibits distension.    GENERAL:  72 y.o.-year-old patient lying in the bed with no acute distress.  EYES: Pupils equal, round, reactive to light and accommodation. No scleral icterus. Extraocular muscles intact.  HEENT: Head atraumatic, normocephalic. Oropharynx and  nasopharynx clear. NG+ NECK:  Supple, no jugular venous distention. No thyroid enlargement, no tenderness.  LUNGS: Normal breath sounds bilaterally, no wheezing, rales, rhonchi. No use of accessory muscles of respiration.  CARDIOVASCULAR: S1, S2 normal. No murmurs, rubs, or gallops.  ABDOMEN: Soft, nontender, ++ distended. Bowel sounds absent. No organomegaly or mass.  EXTREMITIES: No cyanosis, clubbing or edema b/l.    NEUROLOGIC: Cranial nerves II through XII are intact. No focal Motor or sensory deficits b/l.   PSYCHIATRIC:  patient is alert and oriented x 3.  SKIN: No obvious rash, lesion, or ulcer.   LABORATORY PANEL:  CBC Recent Labs  Lab 07/04/18 0447  WBC 17.5*  HGB 11.5*  HCT 34.6*  PLT 418    Chemistries  Recent Labs  Lab 07/03/18 0333 07/04/18 0447  NA 136 134*  K 3.0* 3.6  CL 96* 95*  CO2 28 27  GLUCOSE 132* 149*  BUN 12 14  CREATININE 0.46 0.31*  CALCIUM 7.8* 7.9*  MG 2.0  --    Cardiac Enzymes No results for input(s): TROPONINI in the last 168 hours. RADIOLOGY:  Dg Abd 1 View  Result Date: 07/04/2018 CLINICAL DATA:  72 year old female postoperative day 9 status post colostomy takedown, parastomal hernia repair. Distended abdomen. EXAM: ABDOMEN - 1 VIEW COMPARISON:  CT Abdomen and Pelvis 07/02/2018 and earlier. FINDINGS: Portable AP supine view at 0540 hours. Stable enteric tube, side hole the level of the gastric body. Stable patchy opacity at both lung bases. Stable bowel gas pattern from the recent CT. No definite pneumoperitoneum on these supine views. Stable abdominal surgical clips. Small focus of barium trapped within a sigmoid colon diverticular redemonstrated. Stable visualized  osseous structures. IMPRESSION: 1. Stable enteric tube, side hole at the gastric body level. 2. Unchanged bowel gas pattern since the CT on 07/02/2018. 3. Stable patchy lung base opacity, greater on the left. Electronically Signed   By: Genevie Ann M.D.   On: 07/04/2018 07:17    ASSESSMENT AND PLAN:   * hospital-acquired pneumonia Clinically improving Antitussives as needed basis Continue pneumonia protocol, empiric cefepime (8/10), discontinue antibiotics after 10 days - cultures negative to date -Discussed with Dr. Perrin Maltese, will sign off as patient is clinically improving from pneumonia standpoint  *Status post ileostomy takedown -NG tube replaced, x-rays noted for ileus versus obstruction, general surgery managing ---CT abdomen result noted-- pelvic fluid appears more reactive/inflammatory/peritonitis.?abcess -IV cefepime and Flagyl  *Acute recurrent persistent low-grade fevers -chest x-ray-- mild atelectasis and minimal bibasilar opacity - repeat blood cultures negative - encourage incentive spirometer -  lower extremity Dopplers negative  *Acute hypoxic respiratory failure Likely secondary to pneumonia Scheduled breathing treatments, supplemental oxygen wean as tolerated, encourage senna spirometer, for 2 view chest x-ray, mucolytic agents  -wean to RA  *COPD with acute exacerbation, mild  -Aggressive pulmonary toilet with bronchodilator therapy, inhaled corticosteroids twice daily, supplemental oxygen wean as tolerated -will hold off on IV Solu-Medrol at this time  *Ambulating well. Physical therapy recommends home health PT  d/w dr.Pabone-surgery  Case discussed with Care Management/Social Worker. Management plans discussed with the patient, family and they are in agreement.  CODE STATUS: full  DVT Prophylaxis: lovenox  TOTAL TIME TAKING CARE OF THIS PATIENT: *29 * minutes.  >50% time spent on counselling and coordination of care  POSSIBLE D/C IN *few* DAYS, DEPENDING ON CLINICAL CONDITION.  Note: This dictation was prepared with Dragon dictation along with smaller phrase technology. Any transcriptional errors that result from this process are unintentional.  Nicholes Mango M.D on 07/04/2018 at 12:30 PM  Between 7am to 6pm -  Pager - [phone]   After 6pm go to [email or web address] - password [email or web address] [facility]  Office  [phone]  CC: Primary care physician; Birdie Sons, MDPatient ID: Kimberly [name], female   DOB: 1946/03/10, 72 y.o.   MRN: [id number]

## 2018-07-04 NOTE — Progress Notes (Signed)
POD # 9 ileus partial SBO Feeling better, ambulating No flatus KUB persistent dilation no free air.  There is some gas in the colon now NGT 750cc   PE NAD Abd: soft, decrease bs, minimal tenderness. No infection or peritonitis  A/P Ileus /sbo continue NGT, TPN Abd benign , no surgical intervention Mobilize Enema today

## 2018-07-04 NOTE — Progress Notes (Signed)
MD made aware that pt is unable to take PO medications due to the NGT needing to be clamped for 30 minutes to a hour. RN has offered nausea medication, pt still refuses to attempt PO medications at this time. No new orders given at this time.   Adhira Jamil [employer]

## 2018-07-04 NOTE — Progress Notes (Signed)
PHARMACY - ADULT TOTAL PARENTERAL NUTRITION CONSULT NOTE   Pharmacy Consult for TPN Indication: post-op ileus  Patient Measurements: Height: 5\' 4"  (162.6 cm) Weight: 164 lb 14.5 oz (74.8 kg) IBW/kg (Calculated) : 54.7 TPN AdjBW (KG): 60.2 Body mass index is 28.31 kg/m.  Assessment: POD8, NGT in place for persistent distension and possible ileus on KUB not showing improvement. She has a h/o COPD, partial colectomy with end colostomy and mucous fistula on 04/15/2017. Recent parastomal hernia leading to end ileostomy creation on 12/03/2017, now s/p colostomy takedown with ileocolostomy and parastomal hernia repair 8/61 complicated by post op ileus and HCAP   GI: PPI, TG 170 Endo: no h/o DM Insulin requirements in the past 24 hours: none Lytes:  10/3: K 2.9, P 2.4, Mg 1.8 wnl Replacing with: IV KCl 41mEq, potassium phosphate 500mg  q4h x2 10/4: K 3.0, P 3.1 wnl, Mg 2.0 wnl Replacing with: IV KCl 46mEq Renal: SCr<1 Pulm: Cards:  Hepatobil: Neuro: UO:HFGB PCR (-) WBC 18>22.6>18>14>13.2>10.5 9/28 BCx pending Cefepime 9/29>>       For HCAP Flagyl 10/3>>              For intra-abdominal infection  TPN Access: 07/01/18 TPN start date: 07/01/18 Nutritional Goals (per RD recommendation on 10/02): KCal: 1758 kcal Protein: 90g Fluid:2070ml  Goal TPN rate is 75 ml/hr  Current Nutrition:   Plan:  Continue E5/15 TPN to 11mL/hr. Continue  20% lipid emulsion at 27ml/h for 12 hours/day This TPN provides 90 g of protein, 270 g of dextrose, and 48 g of lipids which provides 961 kCals per day, meeting 90% of patient needs Electrolytes in TPN: no additional Add MVI, trace elements Mild ACHS SSI and adjust as needed LR IVMF at 25 ml/hr Monitor TPN labs:  BMP + mag, phos daily x3 days, then twice weekly F/U tomorrow am  Larene Beach, PharmD 07/04/2018,11:33 AM

## 2018-07-05 ENCOUNTER — Inpatient Hospital Stay: Payer: Medicare HMO

## 2018-07-05 LAB — GLUCOSE, CAPILLARY
GLUCOSE-CAPILLARY: 149 mg/dL — AB (ref 70–99)
GLUCOSE-CAPILLARY: 150 mg/dL — AB (ref 70–99)
Glucose-Capillary: 153 mg/dL — ABNORMAL HIGH (ref 70–99)
Glucose-Capillary: 140 mg/dL — ABNORMAL HIGH (ref 70–99)

## 2018-07-05 LAB — BASIC METABOLIC PANEL
Anion gap: 11 (ref 5–15)
BUN: 19 mg/dL (ref 8–23)
CALCIUM: 7.8 mg/dL — AB (ref 8.9–10.3)
CHLORIDE: 92 mmol/L — AB (ref 98–111)
CO2: 29 mmol/L (ref 22–32)
Creatinine, Ser: 0.56 mg/dL (ref 0.44–1.00)
GFR calc Af Amer: >60 mL/min (ref >60)
GFR calc non Af Amer: >60 mL/min (ref >60)
GLUCOSE: 151 mg/dL — AB (ref 70–99)
Potassium: 3.4 mmol/L — ABNORMAL LOW (ref 3.5–5.1)
Sodium: 132 mmol/L — ABNORMAL LOW (ref 135–145)

## 2018-07-05 LAB — MAGNESIUM: Magnesium: 2.1 mg/dL (ref 1.7–2.4)

## 2018-07-05 LAB — PREALBUMIN: Prealbumin: 6.5 mg/dL — ABNORMAL LOW (ref 18–38)

## 2018-07-05 MED ORDER — FAT EMULSION PLANT BASED 20 % IV EMUL
250.0000 mL | INTRAVENOUS | Status: AC
  Administered 2018-07-05: 250 mL via INTRAVENOUS
  Filled 2018-07-05: qty 250

## 2018-07-05 MED ORDER — TRACE MINERALS CR-CU-MN-SE-ZN 10-1000-500-60 MCG/ML IV SOLN
INTRAVENOUS | Status: AC
  Administered 2018-07-05: 18:00:00 via INTRAVENOUS
  Filled 2018-07-05: qty 1800

## 2018-07-05 MED ORDER — POTASSIUM CHLORIDE 10 MEQ/100ML IV SOLN
10.0000 meq | INTRAVENOUS | Status: AC
  Administered 2018-07-05 (×4): 10 meq via INTRAVENOUS
  Filled 2018-07-05: qty 100

## 2018-07-05 NOTE — Progress Notes (Signed)
Last potassium bag was given at 1345. MAR would not let me correct time from 1245 to 1345.

## 2018-07-05 NOTE — Progress Notes (Signed)
PHARMACY - ADULT TOTAL PARENTERAL NUTRITION CONSULT NOTE   Pharmacy Consult for TPN Indication: post-op ileus  Patient Measurements: Height: 5\' 4"  (162.6 cm) Weight: 164 lb 3.9 oz (74.5 kg) IBW/kg (Calculated) : 54.7 TPN AdjBW (KG): 60.2 Body mass index is 28.19 kg/m.  Assessment: POD8, NGT in place for persistent distension and possible ileus on KUB not showing improvement. She has a h/o COPD, partial colectomy with end colostomy and mucous fistula on 04/15/2017. Recent parastomal hernia leading to end ileostomy creation on 12/03/2017, now s/p colostomy takedown with ileocolostomy and parastomal hernia repair 1/61 complicated by post op ileus and HCAP   GI: PPI, TG 170 Endo: no h/o DM Insulin requirements in the past 24 hours: none Lytes:  10/3: K 2.9, P 2.4, Mg 1.8 wnl Replacing with: IV KCl 60mEq, potassium phosphate 500mg  q4h x2 10/4: K 3.0, P 3.1 wnl, Mg 2.0 wnl Replacing with: IV KCl 38mEq Renal: SCr<1 Pulm: Cards:  Hepatobil: Neuro: WR:UEAV PCR (-) WBC 18>22.6>18>14>13.2>10.5 9/28 BCx pending Cefepime 9/29>>       For HCAP Flagyl 10/3>>              For intra-abdominal infection  TPN Access: 07/01/18 TPN start date: 07/01/18 Nutritional Goals (per RD recommendation on 10/02): KCal: 1758 kcal Protein: 90g Fluid:2075ml  Goal TPN rate is 75 ml/hr  Current Nutrition:   Plan:  Continue E5/15 TPN to 46mL/hr. Continue  20% lipid emulsion at 83ml/h for 12 hours/day This TPN provides 90 g of protein, 270 g of dextrose, and 48 g of lipids which provides 961 kCals per day, meeting 90% of patient needs Electrolytes in TPN: no additional Add MVI, trace elements Mild ACHS SSI and adjust as needed LR IVMF at 25 ml/hr Monitor TPN labs:  BMP + mag, phos daily x3 days, then twice weekly F/U tomorrow am  Larene Beach, PharmD 07/05/2018,10:14 AM

## 2018-07-05 NOTE — Progress Notes (Signed)
Pulled 2mg  morphine from room 205 med list. Pharmacy notified. MAR noted of administration.

## 2018-07-05 NOTE — Progress Notes (Signed)
POD # 10 ileus partial SBO Feeling better, ambulating Had two BM and flatus over the last 24 hras KUB persistent dilation no free air.  There is some gas in the colon now NGT 850cc   PE NAD Abd: soft, decrease bs, minimal tenderness. No infection or peritonitis  A/P Ileus /sbo improving continue NGT, TPN Abd benign , no surgical intervention Mobilize Labs tomorrow if persistent WBC in am may need to repeat CT scan to follow up small collection that may turn into abscess

## 2018-07-06 ENCOUNTER — Encounter: Payer: Self-pay | Admitting: Radiology

## 2018-07-06 ENCOUNTER — Inpatient Hospital Stay: Payer: Medicare HMO

## 2018-07-06 LAB — GLUCOSE, CAPILLARY
GLUCOSE-CAPILLARY: 143 mg/dL — AB (ref 70–99)
Glucose-Capillary: 161 mg/dL — ABNORMAL HIGH (ref 70–99)
Glucose-Capillary: 130 mg/dL — ABNORMAL HIGH (ref 70–99)
Glucose-Capillary: 118 mg/dL — ABNORMAL HIGH (ref 70–99)

## 2018-07-06 LAB — COMPREHENSIVE METABOLIC PANEL
ALBUMIN: 2.1 g/dL — AB (ref 3.5–5.0)
ALK PHOS: 83 U/L (ref 38–126)
ALT: 7 U/L (ref 0–44)
ANION GAP: 8 (ref 5–15)
AST: 13 U/L — ABNORMAL LOW (ref 15–41)
BUN: 15 mg/dL (ref 8–23)
CALCIUM: 7.7 mg/dL — AB (ref 8.9–10.3)
CO2: 28 mmol/L (ref 22–32)
CREATININE: 0.52 mg/dL (ref 0.44–1.00)
Chloride: 97 mmol/L — ABNORMAL LOW (ref 98–111)
GFR calc Af Amer: >60 mL/min (ref >60)
GFR calc non Af Amer: >60 mL/min (ref >60)
GLUCOSE: 156 mg/dL — AB (ref 70–99)
Potassium: 3.7 mmol/L (ref 3.5–5.1)
SODIUM: 133 mmol/L — AB (ref 135–145)
TOTAL PROTEIN: 5.9 g/dL — AB (ref 6.5–8.1)
Total Bilirubin: 0.5 mg/dL (ref 0.3–1.2)

## 2018-07-06 LAB — CBC
HCT: 30.4 % — ABNORMAL LOW (ref 35.0–47.0)
HEMOGLOBIN: 10.4 g/dL — AB (ref 12.0–16.0)
MCH: 32.5 pg (ref 26.0–34.0)
MCHC: 34.1 g/dL (ref 32.0–36.0)
MCV: 95.2 fL (ref 80.0–100.0)
Platelets: 511 10*3/uL — ABNORMAL HIGH (ref 150–440)
RBC: 3.2 MIL/uL — ABNORMAL LOW (ref 3.80–5.20)
RDW: 13.8 % (ref 11.5–14.5)
WBC: 23.8 10*3/uL — ABNORMAL HIGH (ref 3.6–11.0)

## 2018-07-06 LAB — CULTURE, BLOOD (ROUTINE X 2)
CULTURE: NO GROWTH
Culture: NO GROWTH
Special Requests: ADEQUATE

## 2018-07-06 LAB — MAGNESIUM: Magnesium: 2.1 mg/dL (ref 1.7–2.4)

## 2018-07-06 LAB — PHOSPHORUS: Phosphorus: 3.1 mg/dL (ref 2.5–4.6)

## 2018-07-06 MED ORDER — IOPAMIDOL (ISOVUE-300) INJECTION 61%
100.0000 mL | Freq: Once | INTRAVENOUS | Status: AC | PRN
  Administered 2018-07-06: 100 mL via INTRAVENOUS

## 2018-07-06 MED ORDER — PIPERACILLIN-TAZOBACTAM 3.375 G IVPB
3.3750 g | Freq: Three times a day (TID) | INTRAVENOUS | Status: DC
  Administered 2018-07-06 – 2018-07-13 (×21): 3.375 g via INTRAVENOUS
  Filled 2018-07-06 (×23): qty 50

## 2018-07-06 MED ORDER — SODIUM CHLORIDE 0.9 % IV SOLN
INTRAVENOUS | Status: DC
  Administered 2018-07-06 – 2018-07-13 (×4): via INTRAVENOUS

## 2018-07-06 MED ORDER — FAT EMULSION PLANT BASED 20 % IV EMUL
250.0000 mL | INTRAVENOUS | Status: AC
  Administered 2018-07-06: 250 mL via INTRAVENOUS
  Filled 2018-07-06: qty 250

## 2018-07-06 MED ORDER — TRACE MINERALS CR-CU-MN-SE-ZN 10-1000-500-60 MCG/ML IV SOLN
INTRAVENOUS | Status: AC
  Administered 2018-07-06: 19:00:00 via INTRAVENOUS
  Filled 2018-07-06: qty 1800

## 2018-07-06 MED ORDER — IOPAMIDOL (ISOVUE-300) INJECTION 61%
15.0000 mL | INTRAVENOUS | Status: AC
  Administered 2018-07-06 (×2): 15 mL via ORAL

## 2018-07-06 NOTE — Care Management Important Message (Signed)
Copy of signed IM left with patient in room.  

## 2018-07-06 NOTE — Progress Notes (Signed)
Upon assessment of patient's NG tube it was pulled out to 30 cm at nare. Originally it was inserted to 55 cm and verified per xray. I called Dr Dahlia Byes and he advised to advance tube placement to 55 cm and obtain a CXR and abdomen xray.

## 2018-07-06 NOTE — Progress Notes (Signed)
   07/06/18 1000  Clinical Encounter Type  Visited With Patient  Visit Type Follow-up;Spiritual support  Recommendations Follow-up in 2 days  Spiritual Encounters  Spiritual Needs Emotional;Prayer   Patient is handling her health concerns. She spoke about the birthdays she is missing for her grand and great grandchildren. Chaplain offered emotional support, active listening, and prayer.

## 2018-07-06 NOTE — Progress Notes (Signed)
[facility] Progress [facility]  11 Days Post-Op  Subjective: Felt that her NGT was loose overnight and caused her issues. Appears to be pulled back some on KUB. Had a BM this morning. Incisional pain. Cough improving.   Objective: Vital signs in last 24 hours: Temp:  [98.4 F (36.9 C)-98.7 F (37.1 C)] 98.6 F (37 C) (10/07 0438) Pulse Rate:  [84-100] 93 (10/07 0730) Resp:  [16-18] 18 (10/07 0730) BP: (107-147)/(64-78) 107/78 (10/07 0438) SpO2:  [90 %-95 %] 90 % (10/07 0730) Weight:  [74.6 kg] 74.6 kg (10/07 0500) Last BM Date: 07/04/18  Intake/Output from previous day: 10/06 0701 - 10/07 0700 In: 2955.1 [I.V.:2455.1; IV Piggyback:500] Out: 3400 [Urine:1900; Emesis/NG output:1500] Intake/Output this shift: Total I/O In: -  Out: 400 [Urine:400]  PE: Gen:  Alert, NAD, pleasant HEENT; NGT in place Card:  Regular rate and rhythm, Pulm:  Normal effort, expiratory wheezing Abd: Soft, incisional tenderness, non-distended, bowel sounds present in all 4 quadrants, incisions C/D/I  Skin: warm and dry, no rashes  Psych: A&Ox3   Lab Results:  Recent Labs    07/04/18 0447 07/06/18 0430  WBC 17.5* 23.8*  HGB 11.5* 10.4*  HCT 34.6* 30.4*  PLT 418 511*   BMET Recent Labs    07/05/18 0602 07/06/18 0430  NA 132* 133*  K 3.4* 3.7  CL 92* 97*  CO2 29 28  GLUCOSE 151* 156*  BUN 19 15  CREATININE 0.56 0.52  CALCIUM 7.8* 7.7*   PT/INR No results for input(s): LABPROT, INR in the last 72 hours. CMP     Component Value Date/Time   NA 133 (L) 07/06/2018 0430   NA 141 01/02/2017 1410   NA 136 01/27/2012 0637   K 3.7 07/06/2018 0430   K 4.3 01/27/2012 0637   CL 97 (L) 07/06/2018 0430   CL 102 01/27/2012 0637   CO2 28 07/06/2018 0430   CO2 25 01/27/2012 0637   GLUCOSE 156 (H) 07/06/2018 0430   GLUCOSE 119 (H) 01/27/2012 0637   BUN 15 07/06/2018 0430   BUN 14 01/02/2017 1410   BUN 15 01/27/2012 0637   CREATININE 0.52 07/06/2018 0430   CREATININE 0.67  01/27/2012 0637   CALCIUM 7.7 (L) 07/06/2018 0430   CALCIUM 7.7 (L) 01/27/2012 0637   PROT 5.9 (L) 07/06/2018 0430   PROT 7.0 01/02/2017 1410   PROT 7.6 01/26/2012 0021   ALBUMIN 2.1 (L) 07/06/2018 0430   ALBUMIN 3.9 01/02/2017 1410   ALBUMIN 3.5 01/26/2012 0021   AST 13 (L) 07/06/2018 0430   AST 17 01/26/2012 0021   ALT 7 07/06/2018 0430   ALT 19 01/26/2012 0021   ALKPHOS 83 07/06/2018 0430   ALKPHOS 87 01/26/2012 0021   BILITOT 0.5 07/06/2018 0430   BILITOT <0.2 01/02/2017 1410   BILITOT 0.2 01/26/2012 0021   GFRNONAA >60 07/06/2018 0430   GFRNONAA >60 01/27/2012 0637   GFRAA >60 07/06/2018 0430   GFRAA >60 01/27/2012 0637   Lipase     Component Value Date/Time   LIPASE 25 05/03/2017 1247       Studies/Results: Dg Chest 1 View  Result Date: 07/06/2018 CLINICAL DATA:  Eval NG tube placement. EXAM: CHEST  1 VIEW COMPARISON:  07/01/2018 FINDINGS: Nasogastric tube is in place, tip off the film beyond the gastroesophageal junction. A RIGHT-sided PICC line tip overlies the superior vena cava. Heart size is normal. Mild prominence of interstitial markings is stable. There are bibasilar opacities. LEFT pleural effusion. IMPRESSION: Nasogastric tube and  PICC line as described. Bibasilar opacities/LEFT pleural effusion. Electronically Signed   By: Nolon Nations M.D.   On: 07/06/2018 10:09   Dg Abd 1 View  Result Date: 07/06/2018 CLINICAL DATA:  Eval NG tube placement. EXAM: ABDOMEN - 1 VIEW COMPARISON:  07/05/2018 FINDINGS: Nasogastric tube is in place, tip overlying the level of the proximal stomach. Surgical clips are seen in the LEFT UPPER QUADRANT. There is hazy opacity at the LEFT lung base, consistent with atelectasis or infiltrate and pleural effusion. IMPRESSION: Nasogastric tube tip overlying the stomach. LEFT LOWER lobe infiltrate/atelectasis and pleural effusion. Electronically Signed   By: Nolon Nations M.D.   On: 07/06/2018 10:07   Dg Abd Portable 2v  Result  Date: 07/05/2018 CLINICAL DATA:  Small bowel obstruction, history of colon cancer EXAM: PORTABLE ABDOMEN - 2 VIEW COMPARISON:  07/04/2018 abdominal radiograph FINDINGS: Enteric tube terminates in the proximal stomach. Suture line noted in the medial left abdomen. Mild-to-moderate dilatation of small bowel loops throughout the abdomen, not appreciably changed. No evidence of pneumatosis or pneumoperitoneum. Skin staples overlie the left mid and right lower abdomen. Partially visualized fixation hardware in the proximal left femur. IMPRESSION: 1. Enteric tube terminates in the proximal stomach. 2. Stable mild-to-moderate dilatation of small bowel loops throughout the abdomen, favoring diffuse adynamic ileus given the recent postoperative state, with partial distal small bowel obstruction not excluded. Electronically Signed   By: Ilona Sorrel M.D.   On: 07/05/2018 07:26    Anti-infectives: Anti-infectives (From admission, onward)   Start     Dose/Rate Route Frequency Ordered Stop   07/02/18 1200  metroNIDAZOLE (FLAGYL) IVPB 500 mg     500 mg 100 mL/hr over 60 Minutes Intravenous Every 8 hours 07/02/18 1147     06/28/18 0600  vancomycin (VANCOCIN) IVPB 1000 mg/200 mL premix  Status:  Discontinued     1,000 mg 200 mL/hr over 60 Minutes Intravenous Every 12 hours 06/27/18 2358 06/28/18 1700   06/27/18 2245  vancomycin (VANCOCIN) IVPB 1000 mg/200 mL premix     1,000 mg 200 mL/hr over 60 Minutes Intravenous  Once 06/27/18 2243 06/28/18 1100   06/27/18 2215  ceFEPIme (MAXIPIME) 1 g in sodium chloride 0.9 % 100 mL IVPB     1 g 200 mL/hr over 30 Minutes Intravenous Every 8 hours 06/27/18 2207 07/05/18 1659   06/25/18 0600  ertapenem (INVANZ) 1,000 mg in sodium chloride 0.9 % 100 mL IVPB     1 g 200 mL/hr over 30 Minutes Intravenous On call to O.R. 06/24/18 2208 06/25/18 1448       Assessment/Plan  S/P Ileostomy Takedown  POD11 - There is significant concern this morning for abscess vs anastomotic  leak given the continue upward trend in her WBC. Will get CT with IV and PO contrast this morning. - NGT 1500 cc, clamp for imaging studies.  - Continue TPN and IVF,  - Leukocytosis 23.8 this AM, up from 17.5 2 days ago, monitor - Encouraged continued mobilization - Continue IV ABx, will adjust as needed - Mobilize  HCAP - Cough has improved, clear sputum per pt. - Leukocytosis 23.8, PNA vs Abdominal etiology, await DG abd/chest ordered by Dr. Dahlia Byes this AM. - Cefepime complete. - Continue bronchodilators - Encouraged IS use, aggressive pulmonary toilet - Does not use O2 at home, wean as tolerates   LOS: 11 days    Kimberly [name] , PA-C [facility] 07/06/2018, 11:05 AM [phone] M-F: 7am - 4pm

## 2018-07-06 NOTE — Progress Notes (Cosign Needed)
[facility] Progress Note  11 Days Post-Op  Subjective: CC: No acute events overnight. Reports problems sleeping last night due to her NGT moving in and out. Reports 3 BMs since yesterday. + Flatus. NGT output 1500 cc in last 24 hours. Cough improving, clear sputum per pt. Denies CP, SOB, fevers, chills, N/V. Continues to ambulate in unit and use IS. Denies melena, hemtochezia.  Objective: Vital signs in last 24 hours: Temp:  [98.4 F (36.9 C)-98.7 F (37.1 C)] 98.6 F (37 C) (10/07 0438) Pulse Rate:  [84-100] 93 (10/07 0730) Resp:  [16-18] 18 (10/07 0730) BP: (107-147)/(64-78) 107/78 (10/07 0438) SpO2:  [90 %-95 %] 90 % (10/07 0730) Weight:  [74.6 kg] 74.6 kg (10/07 0500) Last BM Date: 07/04/18  Intake/Output from previous day: 10/06 0701 - 10/07 0700 In: 2955.1 [I.V.:2455.1; IV Piggyback:500] Out: 3400 [Urine:1900; Emesis/NG output:1500] Intake/Output this shift: No intake/output data recorded.  PE: Gen:  Alert, NAD, pleasant Card:  Regular rate and rhythm, pedal pulses 2+ BL Pulm: Normal effort, diffuse expiratory wheezing throughout all lung fields. No rales. Abd: Soft, with scarring/fibrosis over previous midline incision. Tenderness over RLQ and left sided incision, tympany, distention, bowel sounds present in all 4 quadrants, no HSM, incisions C/D/I Skin: warm and dry, no rashes, no LE edema b/l. Psych: A&Ox3   Lab Results:  Recent Labs    07/04/18 0447 07/06/18 0430  WBC 17.5* 23.8*  HGB 11.5* 10.4*  HCT 34.6* 30.4*  PLT 418 511*   BMET Recent Labs    07/05/18 0602 07/06/18 0430  NA 132* 133*  K 3.4* 3.7  CL 92* 97*  CO2 29 28  GLUCOSE 151* 156*  BUN 19 15  CREATININE 0.56 0.52  CALCIUM 7.8* 7.7*   PT/INR No results for input(s): LABPROT, INR in the last 72 hours. CMP     Component Value Date/Time   NA 133 (L) 07/06/2018 0430   NA 141 01/02/2017 1410   NA 136 01/27/2012 0637   K 3.7 07/06/2018 0430   K 4.3 01/27/2012 0637    CL 97 (L) 07/06/2018 0430   CL 102 01/27/2012 0637   CO2 28 07/06/2018 0430   CO2 25 01/27/2012 0637   GLUCOSE 156 (H) 07/06/2018 0430   GLUCOSE 119 (H) 01/27/2012 0637   BUN 15 07/06/2018 0430   BUN 14 01/02/2017 1410   BUN 15 01/27/2012 0637   CREATININE 0.52 07/06/2018 0430   CREATININE 0.67 01/27/2012 0637   CALCIUM 7.7 (L) 07/06/2018 0430   CALCIUM 7.7 (L) 01/27/2012 0637   PROT 5.9 (L) 07/06/2018 0430   PROT 7.0 01/02/2017 1410   PROT 7.6 01/26/2012 0021   ALBUMIN 2.1 (L) 07/06/2018 0430   ALBUMIN 3.9 01/02/2017 1410   ALBUMIN 3.5 01/26/2012 0021   AST 13 (L) 07/06/2018 0430   AST 17 01/26/2012 0021   ALT 7 07/06/2018 0430   ALT 19 01/26/2012 0021   ALKPHOS 83 07/06/2018 0430   ALKPHOS 87 01/26/2012 0021   BILITOT 0.5 07/06/2018 0430   BILITOT <0.2 01/02/2017 1410   BILITOT 0.2 01/26/2012 0021   GFRNONAA >60 07/06/2018 0430   GFRNONAA >60 01/27/2012 0637   GFRAA >60 07/06/2018 0430   GFRAA >60 01/27/2012 0637   Lipase     Component Value Date/Time   LIPASE 25 05/03/2017 1247       Studies/Results: Dg Abd Portable 2v  Result Date: 07/05/2018 CLINICAL DATA:  Small bowel obstruction, history of colon cancer EXAM: PORTABLE ABDOMEN - 2 VIEW  COMPARISON:  07/04/2018 abdominal radiograph FINDINGS: Enteric tube terminates in the proximal stomach. Suture line noted in the medial left abdomen. Mild-to-moderate dilatation of small bowel loops throughout the abdomen, not appreciably changed. No evidence of pneumatosis or pneumoperitoneum. Skin staples overlie the left mid and right lower abdomen. Partially visualized fixation hardware in the proximal left femur. IMPRESSION: 1. Enteric tube terminates in the proximal stomach. 2. Stable mild-to-moderate dilatation of small bowel loops throughout the abdomen, favoring diffuse adynamic ileus given the recent postoperative state, with partial distal small bowel obstruction not excluded. Electronically Signed   By: Ilona Sorrel M.D.    On: 07/05/2018 07:26    Anti-infectives: Anti-infectives (From admission, onward)   Start     Dose/Rate Route Frequency Ordered Stop   07/02/18 1200  metroNIDAZOLE (FLAGYL) IVPB 500 mg     500 mg 100 mL/hr over 60 Minutes Intravenous Every 8 hours 07/02/18 1147     06/28/18 0600  vancomycin (VANCOCIN) IVPB 1000 mg/200 mL premix  Status:  Discontinued     1,000 mg 200 mL/hr over 60 Minutes Intravenous Every 12 hours 06/27/18 2358 06/28/18 1700   06/27/18 2245  vancomycin (VANCOCIN) IVPB 1000 mg/200 mL premix     1,000 mg 200 mL/hr over 60 Minutes Intravenous  Once 06/27/18 2243 06/28/18 1100   06/27/18 2215  ceFEPIme (MAXIPIME) 1 g in sodium chloride 0.9 % 100 mL IVPB     1 g 200 mL/hr over 30 Minutes Intravenous Every 8 hours 06/27/18 2207 07/05/18 1659   06/25/18 0600  ertapenem (INVANZ) 1,000 mg in sodium chloride 0.9 % 100 mL IVPB     1 g 200 mL/hr over 30 Minutes Intravenous On call to O.R. 06/24/18 2208 06/25/18 1448       Assessment/Plan   S/P Ileostomy Takedown  POD11 - tired this AM, poor sleep due to NGT moving throughout the night, check placement. - endorses 3 BMs and flatus over past 24 hours. - NGT 1500 cc  - Continue TPN and IVF, pharmacy monitoring, appreciate their help. - Leukocytosis 23.8 this AM, up from 17.5 2 days ago, repeat CT Abd/Pelvis w contrast - Encouraged continued mobilization - continue metronidazole, await CT to adjust as indicated. Spoke with pharmacy, will add another abx to cover Gram Negatives.  HCAP - Cough has improved, clear sputum per pt. - Leukocytosis 23.8, PNA vs Abdominal etiology, await DG abd/chest ordered by Dr. Dahlia Byes this AM. - Cefepime complete. - Continue bronchodilators - Encouraged IS use, aggressive pulmonary toilet - Does not use O2 at home, wean as tolerates   LOS: 11 days    Roetta Sessions , PA-S

## 2018-07-06 NOTE — Progress Notes (Signed)
[facility] CONSULT NOTE   Pharmacy Consult for TPN Indication: post-op ileus  Patient Measurements: Height: 5\' 4"  (162.6 cm) Weight: 164 lb 7.4 oz (74.6 kg) IBW/kg (Calculated) : 54.7 TPN AdjBW (KG): 60.2 Body mass index is 28.23 kg/m.  Assessment: POD 11, NGT in place for persistent distension and possible ileus on KUB not showing improvement. She has a h/o COPD, partial colectomy with end colostomy and mucous fistula on 04/15/2017. Recent parastomal hernia leading to end ileostomy creation on 12/03/2017, now s/p colostomy takedown with ileocolostomy and parastomal hernia repair 5/72 complicated by post op ileus and HCAP.   GI: PPI, TG 170 (10/2) Endo: no h/o DM, BG 140-161 Insulin requirements in the past 24 hours: 5 units Lytes:  K 3.7, Mag 2.1, Phos 3.1, Ca 7.7, Alb 2.1, CorrCa 9.2 - WNL Renal: SCr 0.52, CrCl 64.8 ml/min  TPN Access: 07/01/18 TPN start date: 07/01/18 Nutritional Goals (per RD recommendation on 10/02): KCal: 1758 kcal Protein: 90g Fluid:2011ml  Goal TPN rate is 75 ml/hr  Current Nutrition: NPO + TPN  Plan:  Continue Clinimix E 5/15 TPN at 67mL/hr. Add MVI, trace elements daily.  Continue  20% lipid emulsion at 20 ml/h x 12 hours/day  Continue sensitive SSI q6h and adjust as needed LR IVMF at 25 ml/hr Monitor TPN labs:  BMP + mag, phos daily x3 days, then twice weekly or as needed; triglycerides lab in AM F/U tomorrow am  Rocky Morel, PharmD 07/06/2018,8:45 AM

## 2018-07-07 ENCOUNTER — Inpatient Hospital Stay: Payer: Medicare HMO

## 2018-07-07 LAB — CBC
HCT: 30.4 % — ABNORMAL LOW (ref 35.0–47.0)
HEMOGLOBIN: 10.1 g/dL — AB (ref 12.0–16.0)
MCH: 32.1 pg (ref 26.0–34.0)
MCHC: 33.3 g/dL (ref 32.0–36.0)
MCV: 96.2 fL (ref 80.0–100.0)
PLATELETS: 628 10*3/uL — AB (ref 150–440)
RBC: 3.16 MIL/uL — AB (ref 3.80–5.20)
RDW: 14.1 % (ref 11.5–14.5)
WBC: 18.3 10*3/uL — AB (ref 3.6–11.0)

## 2018-07-07 LAB — BASIC METABOLIC PANEL
ANION GAP: 6 (ref 5–15)
BUN: 16 mg/dL (ref 8–23)
CALCIUM: 7.7 mg/dL — AB (ref 8.9–10.3)
CO2: 31 mmol/L (ref 22–32)
Chloride: 98 mmol/L (ref 98–111)
Creatinine, Ser: 0.39 mg/dL — ABNORMAL LOW (ref 0.44–1.00)
GFR calc Af Amer: >60 mL/min (ref >60)
GLUCOSE: 123 mg/dL — AB (ref 70–99)
Potassium: 3.7 mmol/L (ref 3.5–5.1)
Sodium: 135 mmol/L (ref 135–145)

## 2018-07-07 LAB — GLUCOSE, CAPILLARY
GLUCOSE-CAPILLARY: 131 mg/dL — AB (ref 70–99)
GLUCOSE-CAPILLARY: 136 mg/dL — AB (ref 70–99)
GLUCOSE-CAPILLARY: 146 mg/dL — AB (ref 70–99)
GLUCOSE-CAPILLARY: 152 mg/dL — AB (ref 70–99)
Glucose-Capillary: 142 mg/dL — ABNORMAL HIGH (ref 70–99)

## 2018-07-07 LAB — TRIGLYCERIDES: Triglycerides: 119 mg/dL (ref <150)

## 2018-07-07 MED ORDER — FAT EMULSION PLANT BASED 20 % IV EMUL
250.0000 mL | INTRAVENOUS | Status: AC
  Administered 2018-07-07: 250 mL via INTRAVENOUS
  Filled 2018-07-07: qty 250

## 2018-07-07 MED ORDER — TRACE MINERALS CR-CU-MN-SE-ZN 10-1000-500-60 MCG/ML IV SOLN
INTRAVENOUS | Status: AC
  Administered 2018-07-07: 18:00:00 via INTRAVENOUS
  Filled 2018-07-07: qty 1800

## 2018-07-07 NOTE — Progress Notes (Signed)
PHARMACY - ADULT TOTAL PARENTERAL NUTRITION CONSULT NOTE   Pharmacy Consult for TPN Indication: post-op ileus  Patient Measurements: Height: 5\' 4"  (162.6 cm) Weight: 158 lb 1.1 oz (71.7 kg) IBW/kg (Calculated) : 54.7 TPN AdjBW (KG): 60.2 Body mass index is 27.13 kg/m.  Assessment: POD 11, NGT in place for persistent distension and possible ileus on KUB not showing improvement. She has a h/o COPD, partial colectomy with end colostomy and mucous fistula on 04/15/2017. Recent parastomal hernia leading to end ileostomy creation on 12/03/2017, now s/p colostomy takedown with ileocolostomy and parastomal hernia repair 4/10 complicated by post op ileus and HCAP.   GI: PPI, TG 170 (10/2) Endo: no h/o DM, BG 140-161 Insulin requirements in the past 24 hours: 3 units Lytes:  K 3.7, Mag 2.1, Phos 3.1, Ca 7.7, Alb 2.1, CorrCa 9.2 - WNL Renal: SCr 0.39, CrCl 63.5 ml/min  TPN Access: 07/01/18 TPN start date: 07/01/18 Nutritional Goals (per RD recommendation on 10/02): KCal: 1758 kcal Protein: 90g Fluid:2033ml  Goal TPN rate is 75 ml/hr  Current Nutrition: NPO + TPN  Plan:  Continue Clinimix E 5/15 TPN at 61mL/hr. Add MVI, trace elements daily.  Continue  20% lipid emulsion at 20 ml/h x 12 hours/day  Continue sensitive SSI q6h and adjust as needed NS IVMF at 25 ml/hr Monitor TPN labs:  BMP + mag, phos daily x3 days, then twice weekly or as needed F/U tomorrow am  Forrest Moron, PharmD 07/07/2018,10:43 AM

## 2018-07-07 NOTE — Progress Notes (Addendum)
SURGICAL PROGRESS NOTE  Patient seen and examined as described below with surgical PA-C, Ardell Isaacs.  Assessment/Plan: 72 y.o. female with prolonged post-operative ileus with again improving leukocytosis for small stable post-surgical fluid collections 12 Days Post-Op s/p ileostomy takedown and primary anastomosis, complicated by pertinent comorbidities including a history of HLD, anemia, history of colon cancer, and former chronic tobacco abuse (smoking).   - monitor abdominal exam and bowel function  - pain control as needed (minimize narcotic pain medications)  - continue NG tube with TPN ongoing for now until more consistent flatus with decreased NG tube output   - anticipate removal of NG tube soon with further improved bowel function  - follow up/trend repeat decreasing WBC (CBC) tomorrow morning  - medical management of comorbidities as per medical team  - DVT prophylaxis, ambulation encouraged  All of the above findings and recommendations were discussed with the patient and patient's family, and all of patient's and family's questions were answered to their expressed satisfaction.  -- Marilynne Drivers Rosana Hoes, MD, [place]: [facility] - Partnering for exceptional care. Office: [place] [facility] Day(s): 12.   Post op day(s): 12 Days Post-Op.   Interval History: Patient seen and examined, no acute events or new complaints overnight. Patient continues to notice abdominal distension but is without pain, nausea, or emesis. Additionally she endorses a productive cough but this has gradually improved. No complaints of chest pain, SOB. NPO on TPN. +single reported flatus and no BM since yesterday. Has been mobilizing.  Review of Systems:  Constitutional: denies fever, chills  Respiratory: denies any shortness of breath, + cough Cardiovascular: denies chest pain or palpitations  Gastrointestinal: + Distension,  denies abdominal pain, N/V, or diarrhea/and bowel function as per interval history Musculoskeletal: denies pain, decreased motor or sensation Integumentary: denies any other rashes or skin discolorations  Vital signs in last 24 hours: [min-max] current  Temp:  [98.3 F (36.8 C)-98.5 F (36.9 C)] 98.3 F (36.8 C) (10/08 0416) Pulse Rate:  [85-91] 87 (10/08 0801) Resp:  [16-20] 18 (10/08 0801) BP: (121-145)/(54-69) 145/54 (10/08 0416) SpO2:  [89 %-97 %] 92 % (10/08 0801) Weight:  [71.7 kg] 71.7 kg (10/08 0500)     Height: 5\' 4"  (162.6 cm) Weight: 71.7 kg BMI (Calculated): 27.12   Intake/Output this shift:  No intake/output data recorded.   Intake/Output last 2 shifts:  @[email or web address] @   Physical Exam:  Constitutional: alert, cooperative and no distress HEENT: NGT in place, on [place]  Respiratory: breathing non-labored at rest Cardiovascular: regular rate and sinus rhythm  Gastrointestinal: soft, non-tender, mild distension, tympanic to percussion, LUQ and RLQ incision are well healing, no erythema or drainage.   Labs:  CBC Latest Ref Rng & Units 07/06/2018 07/04/2018 07/03/2018  WBC 3.6 - 11.0 K/uL 23.8(H) 17.5(H) 14.1(H)  Hemoglobin 12.0 - 16.0 g/dL 10.4(L) 11.5(L) 10.6(L)  Hematocrit 35.0 - 47.0 % 30.4(L) 34.6(L) 30.6(L)  Platelets 150 - 440 K/uL 511(H) 418 386   CMP Latest Ref Rng & Units 07/07/2018 07/06/2018 07/05/2018  Glucose 70 - 99 mg/dL 123(H) 156(H) 151(H)  BUN 8 - 23 mg/dL 16 15 19   Creatinine 0.44 - 1.00 mg/dL 0.39(L) 0.52 0.56  Sodium 135 - 145 mmol/L 135 133(L) 132(L)  Potassium 3.5 - 5.1 mmol/L 3.7 3.7 3.4(L)  Chloride 98 - 111 mmol/L 98 97(L) 92(L)  CO2 22 - 32 mmol/L 31 28 29   Calcium 8.9 - 10.3 mg/dL 7.7(L) 7.7(L)  7.8(L)  Total Protein 6.5 - 8.1 g/dL - 5.9(L) -  Total Bilirubin 0.3 - 1.2 mg/dL - 0.5 -  Alkaline Phos 38 - 126 U/L - 83 -  AST 15 - 41 U/L - 13(L) -  ALT 0 - 44 U/L - 7 -    Imaging studies: No new pertinent imaging studies  Assessment/Plan:  (ICD-10's: K73.89) 72 y.o. female with prolonged post-operative ileus with again improving leukocytosis for small stable post-surgical fluid collections 12 Days Post-Op s/p ileostomy takedown and primary anastomosis, complicated by pertinent comorbidities including a history of HLD, anemia, history of colon cancer, and former chronic tobacco abuse (smoking).   - monitor abdominal exam and bowel function  - pain control as needed (minimize narcotic pain medications)  - continue NG tube with TPN ongoing for now until more consistent flatus with decreased NG tube output   - anticipate removal of NG tube soon with further improved bowel function  - follow up/trend repeat decreasing WBC (CBC) tomorrow morning  - medical management of comorbidities as per medical team  - DVT prophylaxis, ambulation encouraged  All of the above findings and recommendations were discussed with the patient and the medical team, and all of patient's questions were answered to her expressed satisfaction.  Thank you for the opportunity to participate in this patient's care.  -- Edison Simon, PA-C [facility] 07/07/2018, 8:10 AM [phone] M-F: 7am - 4pm

## 2018-07-07 NOTE — Progress Notes (Signed)
Physical Therapy Treatment Patient Details Name: Kimberly [name] MRN: [id number] DOB: 1946-01-24 Today's Date: 07/07/2018    History of Present Illness admitted for acute hospitalization after procedure for colostomy takedown and parastomal hernia repair (9/26); post-op course complicated by acute hypoxic respiratory failure with PNA, ileus.    PT Comments    Pt in bed, ready for gait.  Primary nurse in to disconnect suction.  Pt on 3 lpm at rest.  RN requesting qualifying O2 sats.  O2 removed per her request and sats decreased to 88/89 % while sitting EOB.  O2 was re-donned at 3 lpm as sats were not maintaining at rest.  Pt was able to ambulate around nursing unit x 1 with IV pole for support.  She stated she has been walking with nursing and prefers to use it vs walker.  While gait was slow and steady she tolerated well.  She did develop some gas pains during gait and passed a large amount upon return to room but little to no BM was noted.  She remained up in recliner at end of session with sats 92% on 3 lpm.  Primary nurse notified of return to room to reconnect to NG suction.   Follow Up Recommendations  Home health PT     Equipment Recommendations  Rolling walker with 5" wheels    Recommendations for Other Services       Precautions / Restrictions Precautions Precautions: Fall Precaution Comments: NPO, NGT Restrictions Weight Bearing Restrictions: No    Mobility  Bed Mobility Overal bed mobility: Needs Assistance Bed Mobility: Supine to Sit     Supine to sit: Min assist        Transfers Overall transfer level: Needs assistance Equipment used: None Transfers: Sit to/from Stand Sit to Stand: Min assist;Min guard            Ambulation/Gait Ambulation/Gait assistance: Min Gaffer (Feet): 220 Feet Assistive device: IV Pole Gait Pattern/deviations: Step-through pattern;Trunk flexed Gait velocity: decreased   General Gait Details: slow  guarded gait, prefers to use IV pole vs walker at this time.   Stairs             Wheelchair Mobility    Modified Rankin (Stroke Patients Only)       Balance Overall balance assessment: Needs assistance Sitting-balance support: Feet supported Sitting balance-Leahy Scale: Good     Standing balance support: Bilateral upper extremity supported Standing balance-Leahy Scale: Fair                              Cognition Arousal/Alertness: Awake/alert Behavior During Therapy: WFL for tasks assessed/performed Overall Cognitive Status: Within Functional Limits for tasks assessed                                        Exercises Other Exercises Other Exercises: toilet transfer     General Comments        Pertinent Vitals/Pain Pain Assessment: No/denies pain Pain Location: gas pains developed during gait - large amount of gas but little to no BM Pain Descriptors / Indicators: Aching;Grimacing;Guarding    Home Living                      Prior Function            PT Goals (current goals can now  be found in the care plan section) Progress towards PT goals: Progressing toward goals    Frequency    Min 2X/week      PT Plan Current plan remains appropriate    Co-evaluation              AM-PAC PT "6 Clicks" Daily Activity  Outcome Measure  Difficulty turning over in bed (including adjusting bedclothes, sheets and blankets)?: A Little Difficulty moving from lying on back to sitting on the side of the bed? : A Little Difficulty sitting down on and standing up from a chair with arms (e.g., wheelchair, bedside commode, etc,.)?: Unable Help needed moving to and from a bed to chair (including a wheelchair)?: A Little Help needed walking in hospital room?: A Little Help needed climbing 3-5 steps with a railing? : A Little 6 Click Score: 16    End of Session Equipment Utilized During Treatment: Gait belt;Oxygen Activity  Tolerance: Patient tolerated treatment well Patient left: in chair;with call bell/phone within reach;with chair alarm set Nurse Communication: Other (comment)       Time: 3419-3790 PT Time Calculation (min) (ACUTE ONLY): 35 min  Charges:  $Gait Training: 23-37 mins                     Chesley Noon, PTA 07/07/18, 10:37 AM

## 2018-07-08 LAB — CBC
HCT: 30.6 % — ABNORMAL LOW (ref 36.0–46.0)
Hemoglobin: 8.6 g/dL — ABNORMAL LOW (ref 12.0–15.0)
MCH: 31.2 pg (ref 26.0–34.0)
MCHC: 28.1 g/dL — AB (ref 30.0–36.0)
MCV: 110.9 fL — AB (ref 80.0–100.0)
NRBC: 0 % (ref 0.0–0.2)
PLATELETS: 662 10*3/uL — AB (ref 150–400)
RBC: 2.76 MIL/uL — ABNORMAL LOW (ref 3.87–5.11)
RDW: 14.6 % (ref 11.5–15.5)
WBC: 15.6 10*3/uL — ABNORMAL HIGH (ref 4.0–10.5)

## 2018-07-08 LAB — GLUCOSE, CAPILLARY
GLUCOSE-CAPILLARY: 115 mg/dL — AB (ref 70–99)
GLUCOSE-CAPILLARY: 124 mg/dL — AB (ref 70–99)
Glucose-Capillary: 146 mg/dL — ABNORMAL HIGH (ref 70–99)
Glucose-Capillary: 141 mg/dL — ABNORMAL HIGH (ref 70–99)

## 2018-07-08 LAB — BASIC METABOLIC PANEL
Anion gap: 11 (ref 5–15)
BUN: 16 mg/dL (ref 8–23)
CHLORIDE: 96 mmol/L — AB (ref 98–111)
CO2: 26 mmol/L (ref 22–32)
Calcium: 7.9 mg/dL — ABNORMAL LOW (ref 8.9–10.3)
Creatinine, Ser: 0.49 mg/dL (ref 0.44–1.00)
GFR calc Af Amer: >60 mL/min (ref >60)
GFR calc non Af Amer: >60 mL/min (ref >60)
GLUCOSE: 137 mg/dL — AB (ref 70–99)
Potassium: 4.1 mmol/L (ref 3.5–5.1)
Sodium: 133 mmol/L — ABNORMAL LOW (ref 135–145)

## 2018-07-08 MED ORDER — FAT EMULSION PLANT BASED 20 % IV EMUL
250.0000 mL | INTRAVENOUS | Status: AC
  Administered 2018-07-08: 250 mL via INTRAVENOUS
  Filled 2018-07-08: qty 250

## 2018-07-08 MED ORDER — TRACE MINERALS CR-CU-MN-SE-ZN 10-1000-500-60 MCG/ML IV SOLN
INTRAVENOUS | Status: AC
  Administered 2018-07-08: 18:00:00 via INTRAVENOUS
  Filled 2018-07-08: qty 1800

## 2018-07-08 NOTE — Care Management Important Message (Signed)
Copy of signed IM left with patient in room.  

## 2018-07-08 NOTE — Progress Notes (Signed)
Physical Therapy Treatment Patient Details Name: Kimberly [name] MRN: [id number] DOB: [birth year] Today's Date: 07/08/2018    History of Present Illness admitted for acute hospitalization after procedure for colostomy takedown and parastomal hernia repair (9/26); post-op course complicated by acute hypoxic respiratory failure with PNA, ileus.    PT Comments    Kimberly [name] was very pleasant and agreeable to work with therapy.  She ambulated 180 ft using IV pole for support (pt's preference vs. RW).   3 standing rest breaks while ambulating with SpO2 dropping as low as 86% on 3L O2.  However, with rest break, quickly improves to 91-92%. Encouraged pt to continue ambulating in hallway with nursing staff at least 3x/day. Follow up recommendations remain appropriate.    Follow Up Recommendations  Home health PT     Equipment Recommendations  Rolling walker with 5" wheels    Recommendations for Other Services       Precautions / Restrictions Precautions Precautions: Fall Precaution Comments: NPO, NGT, abdominal incisions Restrictions Weight Bearing Restrictions: No    Mobility  Bed Mobility Overal bed mobility: Needs Assistance Bed Mobility: Supine to Sit     Supine to sit: Min guard;HOB elevated     General bed mobility comments: Pt reported pain along lower abdominal incisions with attempt at log roll.  Pt uses bed rail and min guard assist provided for safety.   Transfers Overall transfer level: Needs assistance Equipment used: None Transfers: Sit to/from Stand Sit to Stand: Min guard         General transfer comment: Pt reaches out for IV pole after rising to stand. Pt reaches back for bed when sitting.   Ambulation/Gait Ambulation/Gait assistance: Min guard Gait Distance (Feet): 180 Feet Assistive device: IV Pole Gait Pattern/deviations: Step-through pattern;Trunk flexed Gait velocity: decreased   General Gait Details: Slow, guarded gait.  Pt prefers to use IV pole  vs walker.  3 standing rest breaks with SpO2 dropping as low as 86% on 3L O2.  However, with rest break, quickly improves to 91-92%.    Stairs             Wheelchair Mobility    Modified Rankin (Stroke Patients Only)       Balance Overall balance assessment: Needs assistance Sitting-balance support: No upper extremity supported;Feet supported Sitting balance-Leahy Scale: Good     Standing balance support: Single extremity supported;During functional activity Standing balance-Leahy Scale: Poor Standing balance comment: Pt relies on at least 1UE support for static and dynamic activities                            Cognition Arousal/Alertness: Awake/alert Behavior During Therapy: WFL for tasks assessed/performed Overall Cognitive Status: Within Functional Limits for tasks assessed                                        Exercises Other Exercises Other Exercises: Instructed pt in keeping feet supported when sitting for decreased discomfort.     General Comments        Pertinent Vitals/Pain Pain Assessment: 0-10 Pain Score: 4  Pain Location: abdominal incision Pain Descriptors / Indicators: Aching;Grimacing;Guarding Pain Intervention(s): Limited activity within patient's tolerance;Monitored during session;Patient requesting pain meds-RN notified;RN gave pain meds during session;Utilized relaxation techniques    Home Living  Prior Function            PT Goals (current goals can now be found in the care plan section) Acute Rehab PT Goals Patient Stated Goal: to return home PT Goal Formulation: With patient Time For Goal Achievement: 07/17/18 Potential to Achieve Goals: Good Progress towards PT goals: Progressing toward goals    Frequency    Min 2X/week      PT Plan Current plan remains appropriate    Co-evaluation              AM-PAC PT "6 Clicks" Daily Activity  Outcome Measure   Difficulty turning over in bed (including adjusting bedclothes, sheets and blankets)?: Unable Difficulty moving from lying on back to sitting on the side of the bed? : Unable Difficulty sitting down on and standing up from a chair with arms (e.g., wheelchair, bedside commode, etc,.)?: A Lot Help needed moving to and from a bed to chair (including a wheelchair)?: A Little Help needed walking in hospital room?: A Little Help needed climbing 3-5 steps with a railing? : A Little 6 Click Score: 13    End of Session Equipment Utilized During Treatment: Gait belt;Oxygen Activity Tolerance: Patient tolerated treatment well Patient left: in bed;Other (comment);with call bell/phone within reach;with family/visitor present(sitting EOB with RN at bedside) Nurse Communication: Mobility status;Other (comment);Patient requests pain meds(SpO2) PT Visit Diagnosis: Muscle weakness (generalized) (M62.81);Difficulty in walking, not elsewhere classified (R26.2)     Time: 1914-7829 PT Time Calculation (min) (ACUTE ONLY): 22 min  Charges:  $Gait Training: 8-22 mins                     Kimberly [name] PT, DPT 07/08/2018, 4:22 PM

## 2018-07-08 NOTE — Progress Notes (Signed)
[facility] Progress Note  13 Days Post-Op  Subjective: She notes that she feels slightly better this morning. She notes mild RLQ abdominal pain which has been intermittent the last few days. No nausea or emesis. She continues to mobilize. Endorses flatus multiple times in the last few days and a small BM this morning.   Objective: Vital signs in last 24 hours: Temp:  [98.1 F (36.7 C)-98.7 F (37.1 C)] 98.1 F (36.7 C) (10/09 0428) Pulse Rate:  [78-87] 84 (10/09 0720) Resp:  [16-18] 18 (10/09 0720) BP: (128-142)/(66-72) 142/72 (10/09 0428) SpO2:  [92 %-99 %] 93 % (10/09 0720) Weight:  [73.9 kg-76.6 kg] 76.6 kg (10/09 0428) Last BM Date: 07/06/18  Intake/Output from previous day: 10/08 0701 - 10/09 0700 In: 2285.8 [I.V.:2141; IV Piggyback:144.9] Out: 2400 [Urine:1900; Emesis/NG output:500] Intake/Output this shift: Total I/O In: -  Out: 250 [Emesis/NG output:250]  PE: Gen:  Alert, NAD, pleasant HEENT; NGT in place Card:  Regular rate and rhythm, Pulm:  Normal effort, expiratory wheezing Abd: Soft, mild tenderness RLQ, improvement in distension, bowel sounds present in all 4 quadrants, incisions C/D/I without erythema, staples in place Skin: warm and dry, no rashes  Psych: A&Ox3   Lab Results:  Recent Labs    07/07/18 0856 07/08/18 0655  WBC 18.3* 15.6*  HGB 10.1* 8.6*  HCT 30.4* 30.6*  PLT 628* 662*   BMET Recent Labs    07/06/18 0430 07/07/18 0617  NA 133* 135  K 3.7 3.7  CL 97* 98  CO2 28 31  GLUCOSE 156* 123*  BUN 15 16  CREATININE 0.52 0.39*  CALCIUM 7.7* 7.7*   PT/INR No results for input(s): LABPROT, INR in the last 72 hours. CMP     Component Value Date/Time   NA 135 07/07/2018 0617   NA 141 01/02/2017 1410   NA 136 01/27/2012 0637   K 3.7 07/07/2018 0617   K 4.3 01/27/2012 0637   CL 98 07/07/2018 0617   CL 102 01/27/2012 0637   CO2 31 07/07/2018 0617   CO2 25 01/27/2012 0637   GLUCOSE 123 (H) 07/07/2018 0617    GLUCOSE 119 (H) 01/27/2012 0637   BUN 16 07/07/2018 0617   BUN 14 01/02/2017 1410   BUN 15 01/27/2012 0637   CREATININE 0.39 (L) 07/07/2018 0617   CREATININE 0.67 01/27/2012 0637   CALCIUM 7.7 (L) 07/07/2018 0617   CALCIUM 7.7 (L) 01/27/2012 0637   PROT 5.9 (L) 07/06/2018 0430   PROT 7.0 01/02/2017 1410   PROT 7.6 01/26/2012 0021   ALBUMIN 2.1 (L) 07/06/2018 0430   ALBUMIN 3.9 01/02/2017 1410   ALBUMIN 3.5 01/26/2012 0021   AST 13 (L) 07/06/2018 0430   AST 17 01/26/2012 0021   ALT 7 07/06/2018 0430   ALT 19 01/26/2012 0021   ALKPHOS 83 07/06/2018 0430   ALKPHOS 87 01/26/2012 0021   BILITOT 0.5 07/06/2018 0430   BILITOT <0.2 01/02/2017 1410   BILITOT 0.2 01/26/2012 0021   GFRNONAA >60 07/07/2018 0617   GFRNONAA >60 01/27/2012 0637   GFRAA >60 07/07/2018 0617   GFRAA >60 01/27/2012 0637   Lipase     Component Value Date/Time   LIPASE 25 05/03/2017 1247       Studies/Results: Dg Chest 1 View  Result Date: 07/06/2018 CLINICAL DATA:  Eval NG tube placement. EXAM: CHEST  1 VIEW COMPARISON:  07/01/2018 FINDINGS: Nasogastric tube is in place, tip off the film beyond the gastroesophageal junction. A RIGHT-sided PICC line tip overlies  the superior vena cava. Heart size is normal. Mild prominence of interstitial markings is stable. There are bibasilar opacities. LEFT pleural effusion. IMPRESSION: Nasogastric tube and PICC line as described. Bibasilar opacities/LEFT pleural effusion. Electronically Signed   By: Nolon Nations M.D.   On: 07/06/2018 10:09   Dg Abd 1 View  Result Date: 07/07/2018 CLINICAL DATA:  Abdominal distension EXAM: ABDOMEN - 1 VIEW COMPARISON:  04/07/2009 FINDINGS: Scattered large and small bowel gas is noted. The overall appearance is similar to that seen on the prior CT examination. Mild small bowel dilatation remains. No free air is seen. Nasogastric catheter is noted within the stomach. Postsurgical changes are again noted and stable. IMPRESSION: Stable  small bowel dilatation favoring postoperative ileus. Continued follow-up is recommended. Electronically Signed   By: Inez Catalina M.D.   On: 07/07/2018 09:44   Dg Abd 1 View  Result Date: 07/06/2018 CLINICAL DATA:  Eval NG tube placement. EXAM: ABDOMEN - 1 VIEW COMPARISON:  07/05/2018 FINDINGS: Nasogastric tube is in place, tip overlying the level of the proximal stomach. Surgical clips are seen in the LEFT UPPER QUADRANT. There is hazy opacity at the LEFT lung base, consistent with atelectasis or infiltrate and pleural effusion. IMPRESSION: Nasogastric tube tip overlying the stomach. LEFT LOWER lobe infiltrate/atelectasis and pleural effusion. Electronically Signed   By: Nolon Nations M.D.   On: 07/06/2018 10:07   Ct Abdomen Pelvis W Contrast  Result Date: 07/06/2018 CLINICAL DATA:  Recent colostomy takedown. Follow-up small bowel obstruction. History of colon cancer. Abdominal distention. EXAM: CT ABDOMEN AND PELVIS WITH CONTRAST TECHNIQUE: Multidetector CT imaging of the abdomen and pelvis was performed using the standard protocol following bolus administration of intravenous contrast. CONTRAST:  116mL ISOVUE-300 IOPAMIDOL (ISOVUE-300) INJECTION 61% COMPARISON:  07/02/2018 bibasilar atelectasis or infiltrates are stable. Heart is normal size. No effusions. FINDINGS: Lower chest: Bibasilar atelectasis or infiltrates are stable. Heart is normal size. No effusions. Hepatobiliary: No focal hepatic abnormality. Gallbladder unremarkable. Pancreas: No focal abnormality or ductal dilatation. Spleen: No focal abnormality.  Normal size. Adrenals/Urinary Tract: Bilateral adrenal masses, left larger than right again noted. These are unchanged. The left adrenal mass measures up to 6 cm. No hydronephrosis. Gas again noted within the urinary bladder. Stomach/Bowel: Postoperative changes from ileostomy reversal in the right lower quadrant. Continued dilated small bowel loops throughout the abdomen and pelvis. Caliber  transition to decompressed colon at the splenic flexure. These findings are similar to prior study. No visible obstructing process at the splenic flexure. I favor this represents ileus. Vascular/Lymphatic: Aortic atherosclerosis. No enlarged abdominal or pelvic lymph nodes. Reproductive: Uterus and adnexa unremarkable.  No mass. Other: Multifocal interloop fluid collections are again present in the abdomen and pelvis, the largest adjacent to small bowel loops in the pelvis measuring 5.5 x 2.8 cm, not significantly changed. Cannot exclude interloop abscesses. These are not accessible to percutaneous drainage due to proximity 2 small bowel loops. Stranding in the right lower quadrant, presumably postoperative. Gas and fluid collections in the right lateral abdominal wall are again noted, slightly decreased. Musculoskeletal: No acute bony abnormality. IMPRESSION: Postoperative changes from ileostomy reversal and partial colectomy. Continued dilated small bowel loops diffusely throughout the abdomen and pelvis, favor ileus. This is unchanged. Continued interloop fluid collections throughout the abdomen and pelvis with the largest in the pelvis. These could be postoperative or reflect developing abscesses. No real change. Gas and fluid collections within the right lateral abdominal wall slightly decreased since prior study. Diffuse aortic atherosclerosis. Bibasilar atelectasis  or infiltrates/pneumonia, stable. Bilateral adrenal masses, stable. Electronically Signed   By: Rolm Baptise M.D.   On: 07/06/2018 11:57    Anti-infectives: Anti-infectives (From admission, onward)   Start     Dose/Rate Route Frequency Ordered Stop   07/06/18 1515  piperacillin-tazobactam (ZOSYN) IVPB 3.375 g     3.375 g 12.5 mL/hr over 240 Minutes Intravenous Every 8 hours 07/06/18 1505     07/02/18 1200  metroNIDAZOLE (FLAGYL) IVPB 500 mg  Status:  Discontinued     500 mg 100 mL/hr over 60 Minutes Intravenous Every 8 hours 07/02/18  1147 07/06/18 1505   06/28/18 0600  vancomycin (VANCOCIN) IVPB 1000 mg/200 mL premix  Status:  Discontinued     1,000 mg 200 mL/hr over 60 Minutes Intravenous Every 12 hours 06/27/18 2358 06/28/18 1700   06/27/18 2245  vancomycin (VANCOCIN) IVPB 1000 mg/200 mL premix     1,000 mg 200 mL/hr over 60 Minutes Intravenous  Once 06/27/18 2243 06/28/18 1100   06/27/18 2215  ceFEPIme (MAXIPIME) 1 g in sodium chloride 0.9 % 100 mL IVPB     1 g 200 mL/hr over 30 Minutes Intravenous Every 8 hours 06/27/18 2207 07/05/18 1659   06/25/18 0600  ertapenem (INVANZ) 1,000 mg in sodium chloride 0.9 % 100 mL IVPB     1 g 200 mL/hr over 30 Minutes Intravenous On call to O.R. 06/24/18 2208 06/25/18 1448       Assessment/Plan  S/P Ileostomy Takedown  POD13 - Her abdomen appears softer and less distended this morning and she has passed flatus for the last 2 days with a small BM this morning. - NGT with 500 ccs out in last 24 hours, will clamp today - Continue TPN and IVF,  - Leukocytosis 15 this morning, continue to follow - Encouragedcontinuedmobilization - Continue IV ABx - Will remove surgical staples today  HCAP - Cough has improved, clear sputum per pt. - Leukocytosis to 15, improving, will continue to follow - Continue bronchodilators - Encouraged IS use, aggressive pulmonary toilet - Does not use O2 at home, wean as tolerates    LOS: 13 days    Edison Simon , PA-C [facility] 07/08/2018, 9:11 AM [phone] M-F: 7am - 4pm

## 2018-07-08 NOTE — Progress Notes (Addendum)
Nutrition Follow Up Note   DOCUMENTATION CODES:   Not applicable  INTERVENTION:   Continue Clinimix 5/15 with electrolytes at goal rate of 12m/hr until patient is able to meet at least 60% of her estimated needs via oral intake.   Continue 20% lipids '@20ml' /hr x 12 hrs/day    Regimen provides 1758kcal/day, 90g/day protein, 20486mvolume    Continue MVI daily   Continue trace elements daily    Daily weights  NUTRITION DIAGNOSIS:   Inadequate oral intake related to acute illness as evidenced by other (comment)(pt on NPO/clear liquid diet since admit ).  GOAL:   Patient will meet greater than or equal to 90% of their needs  -met with TPN  MONITOR:   Diet advancement, Labs, Weight trends, Skin, I & O's, Other (Comment)(TPN)  ASSESSMENT:   [age]/o female with h/o COPD, partial colectomy with end colostomy and mucous fistula on 04/15/2017. Recent parastomal hernia leading to end ileostomy creation on 12/03/2017, now s/p colostomy takedown with ileocolostomy and parastomal hernia repair 9/4/45omplicated by post op ileus and HCAP   Pt tolerating TPN well; recommend continue TPN at goal rate until patient is able to meet as least 60% of her estimated needs via oral intake. Pt reports mild abdominal pain today; no nausea or vomiting. Pt is passing flatus and having some some loose bowel movements. NGT tube in place with 50018mutput; per MD note, plan to try clamping tube today. Pt remains NPO. Recommend supplements with diet advancement. Per chart, pt's weight is up about 5lbs since admit; RD will continue to monitor.   Medications reviewed and include: lovenox, insulin, protonix, zosyn, NaCl @ 9m1m, morphine  Labs reviewed: Na 133(L), K 4.1 wnl P 3.1 wnl, Mg 2.1 wnl- 10/7 Triglycerides- 119- 10/8 Wbc- 15.6(H), Hgb 8.6(L), Hct 30.6(L) cbgs- 146, 141 x 24 hrs  Diet Order:   Diet Order            Diet NPO time specified Except for: Ice Chips  Diet effective now              EDUCATION NEEDS:   Education needs have been addressed  Skin:  Skin Assessment: Reviewed RN Assessment(Incision abdomen )  Last BM:  10/9- small type 7  Height:   Ht Readings from Last 1 Encounters:  06/25/18 '5\' 4"'  (1.626 m)    Weight:   Wt Readings from Last 1 Encounters:  07/08/18 76.6 kg    Ideal Body Weight:  54.5 kg  BMI:  Body mass index is 28.99 kg/m.  Estimated Nutritional Needs:   Kcal:  1600-1800kcal/day   Protein:  77-92g/day   Fluid:  >1.6L/day   CaseKoleen Distance RD, LDN Pager #- [phone]#- [phone] Hours Pager: [phone]

## 2018-07-08 NOTE — Progress Notes (Signed)
[facility] CONSULT NOTE   Pharmacy Consult for TPN Indication: post-op ileus  Patient Measurements: Height: 5\' 4"  (162.6 cm) Weight: 168 lb 14 oz (76.6 kg) IBW/kg (Calculated) : 54.7 TPN AdjBW (KG): 60.2 Body mass index is 28.99 kg/m.  Assessment: POD 11, NGT in place for persistent distension and possible ileus on KUB not showing improvement. She has a h/o COPD, partial colectomy with end colostomy and mucous fistula on 04/15/2017. Recent parastomal hernia leading to end ileostomy creation on 12/03/2017, now s/p colostomy takedown with ileocolostomy and parastomal hernia repair 8/52 complicated by post op ileus and HCAP.   GI: PPI, TG 119 (10/8) Endo: no h/o DM, BG 140-161 Insulin requirements in the past 24 hours: 5 units Lytes:  K 4.1, Mag 2.1, Phos 3.1, Ca 7.9, Alb 2.1, CorrCa 9.42 - WNL Renal: SCr 0.49, CrCl 65.6 ml/min  TPN Access: 07/01/18 TPN start date: 07/01/18 Nutritional Goals (per RD recommendation on 10/02): KCal: 1758 kcal Protein: 90g Fluid:2072ml  Goal TPN rate is 75 ml/hr  Current Nutrition: NPO + TPN  Plan:  Continue Clinimix E 5/15 TPN at 56mL/hr. Add MVI, trace elements daily.  Continue  20% lipid emulsion at 20 ml/h x 12 hours/day  Continue sensitive SSI q6h and adjust as needed NS IVMF at 25 ml/hr Monitor TPN labs:  BMP + mag, phos daily x3 days, then twice weekly or as needed F/U tomorrow am  Forrest Moron, PharmD 07/08/2018,12:27 PM

## 2018-07-09 LAB — GLUCOSE, CAPILLARY
GLUCOSE-CAPILLARY: 108 mg/dL — AB (ref 70–99)
Glucose-Capillary: 148 mg/dL — ABNORMAL HIGH (ref 70–99)
Glucose-Capillary: 186 mg/dL — ABNORMAL HIGH (ref 70–99)
Glucose-Capillary: 138 mg/dL — ABNORMAL HIGH (ref 70–99)

## 2018-07-09 LAB — MAGNESIUM: Magnesium: 2.2 mg/dL (ref 1.7–2.4)

## 2018-07-09 LAB — COMPREHENSIVE METABOLIC PANEL
ALT: 7 U/L (ref 0–44)
AST: 11 U/L — ABNORMAL LOW (ref 15–41)
Albumin: 2.2 g/dL — ABNORMAL LOW (ref 3.5–5.0)
Alkaline Phosphatase: 132 U/L — ABNORMAL HIGH (ref 38–126)
Anion gap: 9 (ref 5–15)
BUN: 17 mg/dL (ref 8–23)
CHLORIDE: 100 mmol/L (ref 98–111)
CO2: 27 mmol/L (ref 22–32)
CREATININE: 0.43 mg/dL — AB (ref 0.44–1.00)
Calcium: 8.1 mg/dL — ABNORMAL LOW (ref 8.9–10.3)
GFR calc Af Amer: >60 mL/min (ref >60)
GFR calc non Af Amer: >60 mL/min (ref >60)
GLUCOSE: 129 mg/dL — AB (ref 70–99)
POTASSIUM: 4.2 mmol/L (ref 3.5–5.1)
SODIUM: 136 mmol/L (ref 135–145)
Total Bilirubin: 0.3 mg/dL (ref 0.3–1.2)
Total Protein: 6.6 g/dL (ref 6.5–8.1)

## 2018-07-09 LAB — CBC
HEMATOCRIT: 25.6 % — AB (ref 36.0–46.0)
HEMOGLOBIN: 8.3 g/dL — AB (ref 12.0–15.0)
MCH: 31.6 pg (ref 26.0–34.0)
MCHC: 32.4 g/dL (ref 30.0–36.0)
MCV: 97.3 fL (ref 80.0–100.0)
NRBC: 0 % (ref 0.0–0.2)
Platelets: 682 10*3/uL — ABNORMAL HIGH (ref 150–400)
RBC: 2.63 MIL/uL — AB (ref 3.87–5.11)
RDW: 13.8 % (ref 11.5–15.5)
WBC: 15.2 10*3/uL — AB (ref 4.0–10.5)

## 2018-07-09 LAB — PHOSPHORUS: Phosphorus: 3.8 mg/dL (ref 2.5–4.6)

## 2018-07-09 MED ORDER — TRACE MINERALS CR-CU-MN-SE-ZN 10-1000-500-60 MCG/ML IV SOLN
INTRAVENOUS | Status: AC
  Administered 2018-07-09: 19:00:00 via INTRAVENOUS
  Filled 2018-07-09 (×2): qty 960

## 2018-07-09 MED ORDER — FAT EMULSION PLANT BASED 20 % IV EMUL
250.0000 mL | INTRAVENOUS | Status: AC
  Administered 2018-07-09: 250 mL via INTRAVENOUS
  Filled 2018-07-09 (×2): qty 250

## 2018-07-09 MED ORDER — BOOST / RESOURCE BREEZE PO LIQD CUSTOM
1.0000 | Freq: Three times a day (TID) | ORAL | Status: DC
  Administered 2018-07-09 – 2018-07-10 (×3): 1 via ORAL

## 2018-07-09 NOTE — Progress Notes (Addendum)
Nutrition Follow Up Note   DOCUMENTATION CODES:   Not applicable  INTERVENTION:   Decrease Clinimix 5/15 with electrolytes to 52m/hr   Continue 20% lipids '@20ml' /hr x 12 hrs/day    Continue MVI daily   Continue trace elements daily    Daily weights  Add Boost Breeze po TID, each supplement provides 250 kcal and 9 grams of protein  NUTRITION DIAGNOSIS:   Inadequate oral intake related to acute illness as evidenced by other (comment)(pt on NPO/clear liquid diet since admit ).  GOAL:   Patient will meet greater than or equal to 90% of their needs  -met with TPN  MONITOR:   Diet advancement, Labs, Weight trends, Skin, I & O's, Other (Comment)(TPN)  ASSESSMENT:   72y/o female with h/o COPD, partial colectomy with end colostomy and mucous fistula on 04/15/2017. Recent parastomal hernia leading to end ileostomy creation on 12/03/2017, now s/p colostomy takedown with ileocolostomy and parastomal hernia repair 91/61complicated by post op ileus and HCAP   Pt tolerated clamp trial well and starting on clear liquid diet today. Will plan to decrease TPN to half rate this evening and reassess tomorrow if patient is able to tolerate clear liquids. Will add Ensure once pt advances to full liquids.   Medications reviewed and include: lovenox, insulin, protonix, zosyn, NaCl @ 2105mhr, morphine  Labs reviewed: K 4.2 wnl, P 3.8 wnl, Mg 2.2 wnl Triglycerides- 119- 10/8 Wbc- 15.6(H), Hgb 8.6(L), Hct 30.6(L) cbgs- 137, 129 x 24 hrs  Diet Order:   Diet Order            Diet clear liquid Room service appropriate? Yes; Fluid consistency: Thin  Diet effective now             EDUCATION NEEDS:   Education needs have been addressed  Skin:  Skin Assessment: Reviewed RN Assessment(Incision abdomen )  Last BM:  10/9- small type 7  Height:   Ht Readings from Last 1 Encounters:  06/25/18 '5\' 4"'  (1.626 m)    Weight:   Wt Readings from Last 1 Encounters:  07/09/18 73.1 kg    Ideal  Body Weight:  54.5 kg  BMI:  Body mass index is 27.66 kg/m.  Estimated Nutritional Needs:   Kcal:  1600-1800kcal/day   Protein:  77-92g/day   Fluid:  >1.6L/day   CaKoleen DistanceS, RD, LDN Pager #- [phone]#- [phone] Hours Pager: [phone]

## 2018-07-09 NOTE — Progress Notes (Signed)
PHARMACY - ADULT TOTAL PARENTERAL NUTRITION CONSULT NOTE   Pharmacy Consult for TPN Indication: post-op ileus  Patient Measurements: Height: 5\' 4"  (162.6 cm) Weight: 161 lb 2.5 oz (73.1 kg) IBW/kg (Calculated) : 54.7 TPN AdjBW (KG): 60.2 Body mass index is 27.66 kg/m.  Assessment: POD 11, NGT in place for persistent distension and possible ileus on KUB not showing improvement. She has a h/o COPD, partial colectomy with end colostomy and mucous fistula on 04/15/2017. Recent parastomal hernia leading to end ileostomy creation on 12/03/2017, now s/p colostomy takedown with ileocolostomy and parastomal hernia repair 4/16 complicated by post op ileus and HCAP.   GI: PPI, TG 119 (10/8) Endo: no h/o DM, BG 140-161 Insulin requirements in the past 24 hours: 3 units Lytes:  K 4.2, Mag 2.2, Phos 3.8, Ca 7.9, Alb 2.2, CorrCa 9.54 - WNL Renal: SCr 0.43, CrCl 64.1 ml/min  TPN Access: 07/01/18 TPN start date: 07/01/18 Nutritional Goals (per RD recommendation on 10/02): KCal: 1758 kcal Protein: 90g Fluid:2089ml  Goal TPN rate is 75 ml/hr  Current Nutrition: This clear liquid + Boost TID + TPN  Plan:  Dcrease rate to Clinimix E 5/15 TPN at 51mL/hr. Add MVI, trace elements daily.  Continue  20% lipid emulsion at 20 ml/h x 12 hours/day   Continue sensitive SSI q6h and adjust as needed NS IVMF at 25 ml/hr Monitor TPN labs:  BMP + mag, phos daily x3 days, then twice weekly or as needed F/U tomorrow am  Forrest Moron, PharmD 07/09/2018,11:32 AM

## 2018-07-09 NOTE — Progress Notes (Addendum)
[facility] Progress Note  14 Days Post-Op  Subjective: She is resting comfortably in bed this morning. NGT has been clamped for the last 24 hours and she denied any abdominal pain, nausea, or emesis. Tolerated ice chips and sips of water yesterday. Continues to endorse passing flatus numerous times. No BM in last 24 hours. Continues to mobilize well.   Objective: Vital signs in last 24 hours: Temp:  [97.7 F (36.5 C)-98.2 F (36.8 C)] 97.7 F (36.5 C) (10/10 0438) Pulse Rate:  [78-80] 78 (10/10 0438) Resp:  [14-20] 16 (10/10 0438) BP: (110-127)/(63-70) 110/64 (10/10 0438) SpO2:  [93 %-97 %] 96 % (10/10 0438) Weight:  [73.1 kg] 73.1 kg (10/10 0438) Last BM Date: 07/06/18  Intake/Output from previous day: 10/09 0701 - 10/10 0700 In: 3020.9 [I.V.:2793.2; IV Piggyback:227.8] Out: 1050 [Urine:700; Emesis/NG output:350] Intake/Output this shift: No intake/output data recorded.  PE: Gen: Alert, NAD, pleasant HEENT; NGT in place, On nasal Cannula  Card: Regular rate and rhythm, Pulm: Normal effort, expiratory wheezing Abd: Soft, non-tender, minimal distension, bowel sounds present in all 4 quadrants, incisions C/D/I without erythema, staples removed Skin: warm and dry, no rashes  Psych: A&Ox3   Lab Results:  Recent Labs    07/07/18 0856 07/08/18 0655  WBC 18.3* 15.6*  HGB 10.1* 8.6*  HCT 30.4* 30.6*  PLT 628* 662*   BMET Recent Labs    07/08/18 0816 07/09/18 0625  NA 133* 136  K 4.1 4.2  CL 96* 100  CO2 26 27  GLUCOSE 137* 129*  BUN 16 17  CREATININE 0.49 0.43*  CALCIUM 7.9* 8.1*   PT/INR No results for input(s): LABPROT, INR in the last 72 hours. CMP     Component Value Date/Time   NA 136 07/09/2018 0625   NA 141 01/02/2017 1410   NA 136 01/27/2012 0637   K 4.2 07/09/2018 0625   K 4.3 01/27/2012 0637   CL 100 07/09/2018 0625   CL 102 01/27/2012 0637   CO2 27 07/09/2018 0625   CO2 25 01/27/2012 0637   GLUCOSE 129 (H) 07/09/2018 0625    GLUCOSE 119 (H) 01/27/2012 0637   BUN 17 07/09/2018 0625   BUN 14 01/02/2017 1410   BUN 15 01/27/2012 0637   CREATININE 0.43 (L) 07/09/2018 0625   CREATININE 0.67 01/27/2012 0637   CALCIUM 8.1 (L) 07/09/2018 0625   CALCIUM 7.7 (L) 01/27/2012 0637   PROT 6.6 07/09/2018 0625   PROT 7.0 01/02/2017 1410   PROT 7.6 01/26/2012 0021   ALBUMIN 2.2 (L) 07/09/2018 0625   ALBUMIN 3.9 01/02/2017 1410   ALBUMIN 3.5 01/26/2012 0021   AST 11 (L) 07/09/2018 0625   AST 17 01/26/2012 0021   ALT 7 07/09/2018 0625   ALT 19 01/26/2012 0021   ALKPHOS 132 (H) 07/09/2018 0625   ALKPHOS 87 01/26/2012 0021   BILITOT 0.3 07/09/2018 0625   BILITOT <0.2 01/02/2017 1410   BILITOT 0.2 01/26/2012 0021   GFRNONAA >60 07/09/2018 0625   GFRNONAA >60 01/27/2012 0637   GFRAA >60 07/09/2018 0625   GFRAA >60 01/27/2012 0637   Lipase     Component Value Date/Time   LIPASE 25 05/03/2017 1247       Studies/Results: Dg Abd 1 View  Result Date: 07/07/2018 CLINICAL DATA:  Abdominal distension EXAM: ABDOMEN - 1 VIEW COMPARISON:  04/07/2009 FINDINGS: Scattered large and small bowel gas is noted. The overall appearance is similar to that seen on the prior CT examination. Mild small bowel dilatation  remains. No free air is seen. Nasogastric catheter is noted within the stomach. Postsurgical changes are again noted and stable. IMPRESSION: Stable small bowel dilatation favoring postoperative ileus. Continued follow-up is recommended. Electronically Signed   By: Inez Catalina M.D.   On: 07/07/2018 09:44    Anti-infectives: Anti-infectives (From admission, onward)   Start     Dose/Rate Route Frequency Ordered Stop   07/06/18 1515  piperacillin-tazobactam (ZOSYN) IVPB 3.375 g     3.375 g 12.5 mL/hr over 240 Minutes Intravenous Every 8 hours 07/06/18 1505     07/02/18 1200  metroNIDAZOLE (FLAGYL) IVPB 500 mg  Status:  Discontinued     500 mg 100 mL/hr over 60 Minutes Intravenous Every 8 hours 07/02/18 1147 07/06/18  1505   06/28/18 0600  vancomycin (VANCOCIN) IVPB 1000 mg/200 mL premix  Status:  Discontinued     1,000 mg 200 mL/hr over 60 Minutes Intravenous Every 12 hours 06/27/18 2358 06/28/18 1700   06/27/18 2245  vancomycin (VANCOCIN) IVPB 1000 mg/200 mL premix     1,000 mg 200 mL/hr over 60 Minutes Intravenous  Once 06/27/18 2243 06/28/18 1100   06/27/18 2215  ceFEPIme (MAXIPIME) 1 g in sodium chloride 0.9 % 100 mL IVPB     1 g 200 mL/hr over 30 Minutes Intravenous Every 8 hours 06/27/18 2207 07/05/18 1659   06/25/18 0600  ertapenem (INVANZ) 1,000 mg in sodium chloride 0.9 % 100 mL IVPB     1 g 200 mL/hr over 30 Minutes Intravenous On call to O.R. 06/24/18 2208 06/25/18 1448       Assessment/Plan   S/P Ileostomy Takedown  POD14 -Her abdomen appears softer and less distended this morning and she has passed flatus for the last 3 days. - NGT clamped yesterday and tolerated without nausea or emesis - Will trial on clear liquids this morning, if she tolerates this can d/c NGT  - Wean TPN, dietitian following and appreciate their assistance they will also add boost supplementation.  - Continue serial abdominal exams and follow to ensure continued bowel function - Continue IVF for now - CBC pending this morning, will follow leukocytosis to resolution -Continue IV ABx - Encouragedcontinuedmobilization - DVT Prophylaxis    LOS: 14 days    Edison Simon , PA-C [facility] 07/09/2018, 8:07 AM [phone] M-F: 7am - 4pm

## 2018-07-09 NOTE — Progress Notes (Addendum)
Physical Therapy Treatment Patient Details Name: Kimberly [name] MRN: [id number] DOB: [birth year] Today's Date: 07/09/2018    History of Present Illness admitted for acute hospitalization after procedure for colostomy takedown and parastomal hernia repair (9/26); post-op course complicated by acute hypoxic respiratory failure with PNA, ileus.    PT Comments    Pt in room, getting ready to walk with RN.  Resumed care.  Pt was able to increase ambulation distance today to 2 laps around unit.  She was open to using walker today and overall gait quality, speed and distance improved.  She is aware that she needs AD at this time for general stability due to balance and weakness. Encouraged pt to use walker with gait with nursing vs IV pole and she voiced understanding.  Pt on 3 lpm for gait.  Sats 93% at rest.  They did decrease to 88/89 at times with gait.  Rest breaks encouraged during gait.  92% on 3 lpm upon return to room at rest.   Follow Up Recommendations  Home health PT     Equipment Recommendations  Rolling walker with 5" wheels    Recommendations for Other Services       Precautions / Restrictions Precautions Precautions: Fall Precaution Comments: NPO, NGT, abdominal incisions Restrictions Weight Bearing Restrictions: No    Mobility  Bed Mobility               General bed mobility comments: in recliner  Transfers Overall transfer level: Modified independent Equipment used: None Transfers: Sit to/from Stand Sit to Stand: Supervision            Ambulation/Gait     Assistive device: Rolling walker (2 wheeled) Gait Pattern/deviations: Step-through pattern Gait velocity: decreased       Stairs             Wheelchair Mobility    Modified Rankin (Stroke Patients Only)       Balance Overall balance assessment: Needs assistance Sitting-balance support: No upper extremity supported;Feet supported Sitting balance-Leahy Scale: Good      Standing balance support: Single extremity supported;During functional activity Standing balance-Leahy Scale: Fair Standing balance comment: relies on walker but no LOB                            Cognition Arousal/Alertness: Awake/alert Behavior During Therapy: WFL for tasks assessed/performed Overall Cognitive Status: Within Functional Limits for tasks assessed                                        Exercises      General Comments        Pertinent Vitals/Pain Pain Assessment: 0-10 Pain Score: 2  Pain Location: abdominal incision Pain Descriptors / Indicators: Aching;Grimacing;Guarding Pain Intervention(s): Limited activity within patient's tolerance    Home Living                      Prior Function            PT Goals (current goals can now be found in the care plan section) Progress towards PT goals: Progressing toward goals    Frequency    Min 2X/week      PT Plan Current plan remains appropriate    Co-evaluation              AM-PAC PT "6 Clicks"  Daily Activity  Outcome Measure  Difficulty turning over in bed (including adjusting bedclothes, sheets and blankets)?: A Little Difficulty moving from lying on back to sitting on the side of the bed? : A Little Difficulty sitting down on and standing up from a chair with arms (e.g., wheelchair, bedside commode, etc,.)?: A Little Help needed moving to and from a bed to chair (including a wheelchair)?: A Little Help needed walking in hospital room?: A Little Help needed climbing 3-5 steps with a railing? : A Little 6 Click Score: 18    End of Session Equipment Utilized During Treatment: Gait belt;Oxygen Activity Tolerance: Patient tolerated treatment well Patient left: in chair;with call bell/phone within reach         Time: 1030-1044 PT Time Calculation (min) (ACUTE ONLY): 14 min  Charges:  $Gait Training: 8-22 mins                     Chesley Noon,  PTA 07/09/18, 11:06 AM

## 2018-07-10 LAB — CBC
HEMATOCRIT: 32.2 % — AB (ref 36.0–46.0)
HEMOGLOBIN: 10.1 g/dL — AB (ref 12.0–15.0)
MCH: 30.8 pg (ref 26.0–34.0)
MCHC: 31.4 g/dL (ref 30.0–36.0)
MCV: 98.2 fL (ref 80.0–100.0)
Platelets: 861 10*3/uL — ABNORMAL HIGH (ref 150–400)
RBC: 3.28 MIL/uL — AB (ref 3.87–5.11)
RDW: 13.6 % (ref 11.5–15.5)
WBC: 17.5 10*3/uL — ABNORMAL HIGH (ref 4.0–10.5)
nRBC: 0 % (ref 0.0–0.2)

## 2018-07-10 LAB — GLUCOSE, CAPILLARY
GLUCOSE-CAPILLARY: 129 mg/dL — AB (ref 70–99)
Glucose-Capillary: 93 mg/dL (ref 70–99)
Glucose-Capillary: 136 mg/dL — ABNORMAL HIGH (ref 70–99)
Glucose-Capillary: 140 mg/dL — ABNORMAL HIGH (ref 70–99)

## 2018-07-10 MED ORDER — TRACE MINERALS CR-CU-MN-SE-ZN 10-1000-500-60 MCG/ML IV SOLN
INTRAVENOUS | Status: AC
  Administered 2018-07-10: 18:00:00 via INTRAVENOUS
  Filled 2018-07-10: qty 960

## 2018-07-10 MED ORDER — FAT EMULSION PLANT BASED 20 % IV EMUL
250.0000 mL | INTRAVENOUS | Status: AC
  Administered 2018-07-10: 250 mL via INTRAVENOUS
  Filled 2018-07-10: qty 250

## 2018-07-10 MED ORDER — ENSURE ENLIVE PO LIQD
237.0000 mL | Freq: Two times a day (BID) | ORAL | Status: DC
  Administered 2018-07-10 – 2018-07-13 (×6): 237 mL via ORAL

## 2018-07-10 NOTE — Care Management Important Message (Signed)
Copy of signed IM left with patient in room.  

## 2018-07-10 NOTE — Care Management (Signed)
NG has been removed, patient on RA  Barriers: TPN, full liquid diet  Patient is still agreeable to home health with [facility].  Will need RW at discharge.

## 2018-07-10 NOTE — Progress Notes (Signed)
[facility] CONSULT NOTE   Pharmacy Consult for TPN Indication: post-op ileus  Patient Measurements: Height: 5\' 4"  (162.6 cm) Weight: 163 lb 9.3 oz (74.2 kg) IBW/kg (Calculated) : 54.7 TPN AdjBW (KG): 60.2 Body mass index is 28.08 kg/m.  Assessment: POD 11, NGT in place for persistent distension and possible ileus on KUB not showing improvement. She has a h/o COPD, partial colectomy with end colostomy and mucous fistula on 04/15/2017. Recent parastomal hernia leading to end ileostomy creation on 12/03/2017, now s/p colostomy takedown with ileocolostomy and parastomal hernia repair 9/38 complicated by post op ileus and HCAP.   GI: PPI, TG 119 (10/8) Endo: no h/o DM, BG 140-161 Insulin requirements in the past 24 hours: 3 units Lytes:  K 4.2, Mag 2.2, Phos 3.8, Ca 7.9, Alb 2.2, CorrCa 9.54 - WNL Renal: SCr 0.43, CrCl 64.1 ml/min  TPN Access: 07/01/18 TPN start date: 07/01/18 Nutritional Goals (per RD recommendation on 10/02): KCal: 1758 kcal Protein: 90g Fluid:2066ml  Goal TPN rate is 75 ml/hr  Current Nutrition: This clear liquid + Boost TID + TPN  Plan:  Continue Clinimix E 5/15 TPN at 6mL/hr. Add MVI, trace elements daily.  Continue  20% lipid emulsion at 20 ml/h x 12 hours/day   TPN will be reevaluated tomorrow with possible discontinuation.  Continue sensitive SSI q6h and adjust as needed NS IVMF at 25 ml/hr Monitor TPN labs:  BMP + mag, phos daily x3 days, then twice weekly or as needed F/U tomorrow am  Forrest Moron, PharmD 07/10/2018,12:48 PM

## 2018-07-10 NOTE — Progress Notes (Signed)
[facility] Progress Note  15 Days Post-Op  Subjective: Sitting up in bed this morning. No abdominal pain, nausea, or emesis. She has ben tolerating clears over 24 hours. No bowel movement but multiple episodes of flatus. Continues to mobilize. Notes her breathing and cough continue to improve.   Objective: Vital signs in last 24 hours: Temp:  [97.8 F (36.6 C)-98.2 F (36.8 C)] 98.2 F (36.8 C) (10/11 0422) Pulse Rate:  [79-88] 79 (10/11 0422) Resp:  [16-20] 16 (10/11 0422) BP: (100-111)/(56-63) 111/56 (10/11 0422) SpO2:  [91 %-98 %] 91 % (10/11 0422) Weight:  [74.2 kg] 74.2 kg (10/11 0422) Last BM Date: 07/06/18  Intake/Output from previous day: 10/10 0701 - 10/11 0700 In: 2103.2 [P.O.:120; I.V.:1757.5; IV Piggyback:225.7] Out: -  Intake/Output this shift: No intake/output data recorded.  PE: Gen:  Alert, NAD, pleasant Card:  Regular rate and rhythm Pulm:  Normal effort, clear to auscultation bilaterally Abd: Soft, non-tender, non-distended, dull to percussion, incisions C/D/I Skin: warm and dry, no rashes  Psych: A&Ox3   Lab Results:  Recent Labs    07/08/18 0655 07/09/18 0747  WBC 15.6* 15.2*  HGB 8.6* 8.3*  HCT 30.6* 25.6*  PLT 662* 682*   BMET Recent Labs    07/08/18 0816 07/09/18 0625  NA 133* 136  K 4.1 4.2  CL 96* 100  CO2 26 27  GLUCOSE 137* 129*  BUN 16 17  CREATININE 0.49 0.43*  CALCIUM 7.9* 8.1*   PT/INR No results for input(s): LABPROT, INR in the last 72 hours. CMP     Component Value Date/Time   NA 136 07/09/2018 0625   NA 141 01/02/2017 1410   NA 136 01/27/2012 0637   K 4.2 07/09/2018 0625   K 4.3 01/27/2012 0637   CL 100 07/09/2018 0625   CL 102 01/27/2012 0637   CO2 27 07/09/2018 0625   CO2 25 01/27/2012 0637   GLUCOSE 129 (H) 07/09/2018 0625   GLUCOSE 119 (H) 01/27/2012 0637   BUN 17 07/09/2018 0625   BUN 14 01/02/2017 1410   BUN 15 01/27/2012 0637   CREATININE 0.43 (L) 07/09/2018 0625   CREATININE  0.67 01/27/2012 0637   CALCIUM 8.1 (L) 07/09/2018 0625   CALCIUM 7.7 (L) 01/27/2012 0637   PROT 6.6 07/09/2018 0625   PROT 7.0 01/02/2017 1410   PROT 7.6 01/26/2012 0021   ALBUMIN 2.2 (L) 07/09/2018 0625   ALBUMIN 3.9 01/02/2017 1410   ALBUMIN 3.5 01/26/2012 0021   AST 11 (L) 07/09/2018 0625   AST 17 01/26/2012 0021   ALT 7 07/09/2018 0625   ALT 19 01/26/2012 0021   ALKPHOS 132 (H) 07/09/2018 0625   ALKPHOS 87 01/26/2012 0021   BILITOT 0.3 07/09/2018 0625   BILITOT <0.2 01/02/2017 1410   BILITOT 0.2 01/26/2012 0021   GFRNONAA >60 07/09/2018 0625   GFRNONAA >60 01/27/2012 0637   GFRAA >60 07/09/2018 0625   GFRAA >60 01/27/2012 0637   Lipase     Component Value Date/Time   LIPASE 25 05/03/2017 1247       Studies/Results: No results found.  Anti-infectives: Anti-infectives (From admission, onward)   Start     Dose/Rate Route Frequency Ordered Stop   07/06/18 1515  piperacillin-tazobactam (ZOSYN) IVPB 3.375 g     3.375 g 12.5 mL/hr over 240 Minutes Intravenous Every 8 hours 07/06/18 1505     07/02/18 1200  metroNIDAZOLE (FLAGYL) IVPB 500 mg  Status:  Discontinued     500 mg 100 mL/hr  over 60 Minutes Intravenous Every 8 hours 07/02/18 1147 07/06/18 1505   06/28/18 0600  vancomycin (VANCOCIN) IVPB 1000 mg/200 mL premix  Status:  Discontinued     1,000 mg 200 mL/hr over 60 Minutes Intravenous Every 12 hours 06/27/18 2358 06/28/18 1700   06/27/18 2245  vancomycin (VANCOCIN) IVPB 1000 mg/200 mL premix     1,000 mg 200 mL/hr over 60 Minutes Intravenous  Once 06/27/18 2243 06/28/18 1100   06/27/18 2215  ceFEPIme (MAXIPIME) 1 g in sodium chloride 0.9 % 100 mL IVPB     1 g 200 mL/hr over 30 Minutes Intravenous Every 8 hours 06/27/18 2207 07/05/18 1659   06/25/18 0600  ertapenem (INVANZ) 1,000 mg in sodium chloride 0.9 % 100 mL IVPB     1 g 200 mL/hr over 30 Minutes Intravenous On call to O.R. 06/24/18 2208 06/25/18 1448       Assessment/Plan  S/P Ileostomy Takedown   POD15 -Her abdomen appears softer and less distended this morning and she has passed flatus for the last 3 days. - NGTout yesterday - Tolerating clears, will advance to fulls - Wean TPN, dietitian following and appreciate their assistance they will also add boost supplementation.  - Continue serial abdominal exams and follow to ensure continued bowel function - Continue IVF for now, decrease as she tolerates diet - CBC pending this morning -Continue IV ABx - Encouragedcontinuedmobilization - DVT Prophylaxis   LOS: 15 days    Edison Simon , PA-C [facility] 07/10/2018, 8:09 AM [phone] M-F: 7am - 4pm

## 2018-07-10 NOTE — Progress Notes (Addendum)
Nutrition Brief Note   DOCUMENTATION CODES:   Not applicable  INTERVENTION:   Continue Clinimix 5/15 with electrolytes at 22ml/hr- Plan to discontinue 10/12 after surgery assessment   Continue 20% lipids @20ml /hr x 12 hrs/day    Continue MVI daily   Continue trace elements daily    Daily weights  Change Boost Breeze to Ensure Enlive po BID, each supplement provides 350 kcal and 20 grams of protein  Labs reviewed: K 4.2 wnl, P 3.8 wnl, Mg 2.2 wnl  Estimated Nutritional Needs:   Kcal:  1600-1800kcal/day   Protein:  77-92g/day   Fluid:  >1.6L/day   Koleen Distance MS, RD, LDN Pager #- [phone] Office#- [phone] After Hours Pager: [phone]

## 2018-07-11 LAB — GLUCOSE, CAPILLARY
GLUCOSE-CAPILLARY: 116 mg/dL — AB (ref 70–99)
GLUCOSE-CAPILLARY: 138 mg/dL — AB (ref 70–99)

## 2018-07-11 MED ORDER — IPRATROPIUM-ALBUTEROL 0.5-2.5 (3) MG/3ML IN SOLN
3.0000 mL | Freq: Two times a day (BID) | RESPIRATORY_TRACT | Status: DC
  Administered 2018-07-11 – 2018-07-13 (×4): 3 mL via RESPIRATORY_TRACT
  Filled 2018-07-11 (×4): qty 3

## 2018-07-11 NOTE — Progress Notes (Signed)
[facility] Hospital Day(s): 16.   Post op day(s): 16 Days Post-Op.   Interval History: Patient seen and examined, no acute events or new complaints overnight. Patient reports tolerating full liquid diets with +flatus and +BM, limited ambulation in the halls with PT, denies abdominal pain, N/V, abdominal distention/bloating, fever/chills, CP, or SOB. TPN rate decreased to 40 mL / hr 48 hours ago.  Review of Systems:  Constitutional: denies fever, chills  Respiratory: denies any shortness of breath  Cardiovascular: denies chest pain or palpitations  Gastrointestinal: abdominal pain, N/V, and bowel function as per interval history Musculoskeletal: denies pain, decreased motor or sensation Integumentary: denies any other rashes or skin discolorations except post-surgical wound  Vital signs in last 24 hours: [min-max] current  Temp:  [97.9 F (36.6 C)-98.4 F (36.9 C)] 97.9 F (36.6 C) (10/12 0459) Pulse Rate:  [86-100] 86 (10/12 0459) Resp:  [16] 16 (10/12 0459) BP: (99-117)/(54-70) 117/70 (10/12 0459) SpO2:  [91 %-93 %] 92 % (10/12 0902) Weight:  [73.4 kg] 73.4 kg (10/12 0459)     Height: 5\' 4"  (162.6 cm) Weight: 73.4 kg BMI (Calculated): 27.76   Intake/Output this shift:  Total I/O In: 360 [P.O.:360] Out: -    Intake/Output last 2 shifts:  @IOLAST2SHIFTS @   Physical Exam:  Constitutional: alert, cooperative and no distress  Respiratory: breathing non-labored at rest  Cardiovascular: regular rate and sinus rhythm  Gastrointestinal: soft, non-tender, and non-distended, post-surgical wounds well-approximated without surrounding erythema or drainage  Labs:  CBC Latest Ref Rng & Units 07/10/2018 07/09/2018 07/08/2018  WBC 4.0 - 10.5 K/uL 17.5(H) 15.2(H) 15.6(H)  Hemoglobin 12.0 - 15.0 g/dL 10.1(L) 8.3(L) 8.6(L)  Hematocrit 36.0 - 46.0 % 32.2(L) 25.6(L) 30.6(L)  Platelets 150 - 400 K/uL 861(H) 682(H) 662(H)   CMP Latest Ref Rng & Units 07/09/2018 07/08/2018  07/07/2018  Glucose 70 - 99 mg/dL 129(H) 137(H) 123(H)  BUN 8 - 23 mg/dL 17 16 16   Creatinine 0.44 - 1.00 mg/dL 0.43(L) 0.49 0.39(L)  Sodium 135 - 145 mmol/L 136 133(L) 135  Potassium 3.5 - 5.1 mmol/L 4.2 4.1 3.7  Chloride 98 - 111 mmol/L 100 96(L) 98  CO2 22 - 32 mmol/L 27 26 31   Calcium 8.9 - 10.3 mg/dL 8.1(L) 7.9(L) 7.7(L)  Total Protein 6.5 - 8.1 g/dL 6.6 - -  Total Bilirubin 0.3 - 1.2 mg/dL 0.3 - -  Alkaline Phos 38 - 126 U/L 132(H) - -  AST 15 - 41 U/L 11(L) - -  ALT 0 - 44 U/L 7 - -   Imaging studies: No new pertinent imaging studies  Assessment/Plan: 72 y.o. female with resolving prolonged post-operative ileus, overall otherwise doing well with likewise improved respiratory status and improving ambulation, complicated by small stable peritoneal abscesses not amenable to percutaneous drainage on recent follow-up CT imaging, now 16 Days Post-Op s/p ileostomy takedown and primary anastomosis, complicated by comorbidities including a history of HLD, anemia, history of colon cancer, and former chronic tobacco abuse (smoking).   - advanced to soft diet  - discussed with pharmacy stop TPN tonight             - continue to monitor abdominal exam and bowel function             - medical management of comorbidities as per medical team  - antibiotics ongoing for treatment of peritoneal abscesses             - DVT prophylaxis, ambulation encouraged  - discharge planning soon  All of the above findings and recommendations were discussed with the patient and patient's RN, and all of patient's questions were answered to her expressed satisfaction.  -- Marilynne Drivers Rosana Hoes, MD, [place]: [facility] - Partnering for exceptional care. Office: [phone]

## 2018-07-11 NOTE — Progress Notes (Signed)
[place] CONSULT NOTE   Pharmacy Consult for TPN Indication: post-op ileus  Patient Measurements: Height: 5\' 4"  (162.6 cm) Weight: 161 lb 12.8 oz (73.4 kg) IBW/kg (Calculated) : 54.7 TPN AdjBW (KG): 60.2 Body mass index is 27.77 kg/m.  Assessment: NGT in place for persistent distension and possible ileus on KUB not showing improvement. She has a h/o COPD, partial colectomy with end colostomy and mucous fistula on 04/15/2017. Recent parastomal hernia leading to end ileostomy creation on 12/03/2017, now s/p colostomy takedown with ileocolostomy and parastomal hernia repair 5/39 complicated by post op ileus and HCAP.   GI: PPI, TG 119 (10/8) Endo: no h/o DM, BG 116-140 Insulin requirements in the past 24 hours: 2 units Lytes: no labs 10/12 Renal: SCr 0.43, CrCl 64.1 ml/min  TPN Access: 07/01/18 TPN start date: 07/01/18 Nutritional Goals (per RD recommendation on 10/02): KCal: 1758 kcal Protein: 90g Fluid:2037ml  Goal TPN rate is 75 ml/hr  Current Nutrition: This clear liquid + Boost TID + TPN --> advanced to soft diet on 10/12  Plan:  Current orders for Clinimix E 5/15 TPN at 80mL/hr.   Spoke with Dr Rosana Hoes. TPN already at half goal rate. Per MD d/c TPN tonight (will not reorder tonight).      Rocky Morel, PharmD 07/11/2018,1:32 PM

## 2018-07-11 NOTE — Progress Notes (Signed)
Physical Therapy Treatment Patient Details Name: Kimberly [name] MRN: [id number] DOB: 1946/04/29 Today's Date: 07/11/2018    History of Present Illness admitted for acute hospitalization after procedure for colostomy takedown and parastomal hernia repair (9/26); post-op course complicated by acute hypoxic respiratory failure with PNA, ileus.    PT Comments    Pt sitting edge of bed upon arrival.  Stated she had just finished bathing etc.  Pt on room air.  Sats 88/89 % which pt attributed to general fatigue.  She was able to recover to 91-92% with deep breathing but would return to 88-89% when stopped.   Gait with RW x 1 lap with min guard.  Sats monitored and dropped to 81% after lap.  She was returned to room to rest where they increased back to baseline of 88-89%.  Discussed with RN.  Pt left on room air at this time but RN will continue to monitor and pt encouraged to call if she felt SOB.  She did not report feeling SOB when they dropped to 81.    Will need to continue to monitor O2 sats with mobility.   Follow Up Recommendations  Home health PT     Equipment Recommendations  Rolling walker with 5" wheels    Recommendations for Other Services       Precautions / Restrictions Precautions Precautions: Fall Precaution Comments: NPO, NGT, abdominal incisions Restrictions Weight Bearing Restrictions: No    Mobility  Bed Mobility               General bed mobility comments: sitting edge of bed upon arrival  Transfers Overall transfer level: Modified independent Equipment used: None Transfers: Sit to/from Stand Sit to Stand: Supervision            Ambulation/Gait Ambulation/Gait assistance: Min guard Gait Distance (Feet): 250 Feet Assistive device: Rolling walker (2 wheeled) Gait Pattern/deviations: Step-through pattern Gait velocity: decreased       Stairs             Wheelchair Mobility    Modified Rankin (Stroke Patients Only)        Balance Overall balance assessment: Needs assistance Sitting-balance support: No upper extremity supported;Feet supported Sitting balance-Leahy Scale: Good     Standing balance support: Single extremity supported;During functional activity Standing balance-Leahy Scale: Fair Standing balance comment: relies on walker but no LOB                            Cognition Arousal/Alertness: Awake/alert Behavior During Therapy: WFL for tasks assessed/performed Overall Cognitive Status: Within Functional Limits for tasks assessed                                        Exercises      General Comments        Pertinent Vitals/Pain Pain Location: abdominal incision Pain Descriptors / Indicators: Aching;Grimacing;Guarding Pain Intervention(s): Limited activity within patient's tolerance    Home Living                      Prior Function            PT Goals (current goals can now be found in the care plan section) Progress towards PT goals: Progressing toward goals    Frequency    Min 2X/week      PT Plan Current plan  remains appropriate    Co-evaluation              AM-PAC PT "6 Clicks" Daily Activity  Outcome Measure  Difficulty turning over in bed (including adjusting bedclothes, sheets and blankets)?: None Difficulty moving from lying on back to sitting on the side of the bed? : None Difficulty sitting down on and standing up from a chair with arms (e.g., wheelchair, bedside commode, etc,.)?: None Help needed moving to and from a bed to chair (including a wheelchair)?: None Help needed walking in hospital room?: A Little Help needed climbing 3-5 steps with a railing? : A Little 6 Click Score: 22    End of Session Equipment Utilized During Treatment: Gait belt;Oxygen Activity Tolerance: Patient tolerated treatment well Patient left: in chair;with call bell/phone within reach Nurse Communication: Other (comment)        Time: 5170-0174 PT Time Calculation (min) (ACUTE ONLY): 17 min  Charges:  $Gait Training: 8-22 mins                     Chesley Noon, PTA 07/11/18, 11:31 AM

## 2018-07-12 NOTE — Progress Notes (Signed)
[facility] Hospital Day(s): 17.   Post op day(s): 17 Days Post-Op.   Interval History: Patient seen and examined, no acute events or new complaints overnight. Patient reports tolerating soft diet with +flatus and +BM's, TPN discontinued yesterday evening, ambulating in halls and pain controlled with only oral pain medication, denies N/V, fever/chills, CP, or SOB.  Review of Systems:  Constitutional: denies fever, chills  Respiratory: denies any shortness of breath  Cardiovascular: denies chest pain or palpitations  Gastrointestinal: abdominal pain, N/V, and bowel function as per interval history Musculoskeletal: denies pain, decreased motor or sensation Integumentary: denies any other rashes or skin discolorations except post-surgical wounds  Vital signs in last 24 hours: [min-max] current  Temp:  [98 F (36.7 C)-98.6 F (37 C)] 98.6 F (37 C) (10/13 0551) Pulse Rate:  [84-93] 90 (10/13 0727) Resp:  [18-20] 18 (10/13 0727) BP: (105-117)/(60-66) 110/60 (10/13 0551) SpO2:  [87 %-93 %] 87 % (10/13 0727)     Height: 5\' 4"  (162.6 cm) Weight: 73.4 kg BMI (Calculated): 27.76   Intake/Output this shift:  Total I/O In: 120 [P.O.:120] Out: -    Intake/Output last 2 shifts:  @IOLAST2SHIFTS @   Physical Exam:  Constitutional: alert, cooperative and no distress  Respiratory: breathing non-labored at rest  Cardiovascular: regular rate and sinus rhythm  Gastrointestinal: soft, non-tender, and non-distended, incision well-approximated without surrounding erythema or drainage  Labs:  CBC Latest Ref Rng & Units 07/10/2018 07/09/2018 07/08/2018  WBC 4.0 - 10.5 K/uL 17.5(H) 15.2(H) 15.6(H)  Hemoglobin 12.0 - 15.0 g/dL 10.1(L) 8.3(L) 8.6(L)  Hematocrit 36.0 - 46.0 % 32.2(L) 25.6(L) 30.6(L)  Platelets 150 - 400 K/uL 861(H) 682(H) 662(H)   CMP Latest Ref Rng & Units 07/09/2018 07/08/2018 07/07/2018  Glucose 70 - 99 mg/dL 129(H) 137(H) 123(H)  BUN 8 - 23 mg/dL 17 16 16    Creatinine 0.44 - 1.00 mg/dL 0.43(L) 0.49 0.39(L)  Sodium 135 - 145 mmol/L 136 133(L) 135  Potassium 3.5 - 5.1 mmol/L 4.2 4.1 3.7  Chloride 98 - 111 mmol/L 100 96(L) 98  CO2 22 - 32 mmol/L 27 26 31   Calcium 8.9 - 10.3 mg/dL 8.1(L) 7.9(L) 7.7(L)  Total Protein 6.5 - 8.1 g/dL 6.6 - -  Total Bilirubin 0.3 - 1.2 mg/dL 0.3 - -  Alkaline Phos 38 - 126 U/L 132(H) - -  AST 15 - 41 U/L 11(L) - -  ALT 0 - 44 U/L 7 - -   Imaging studies: No new pertinent imaging studies  Assessment/Plan: 72 y.o.femalewith resolving prolongedpost-operative ileus, overall otherwise doing well with likewise improved respiratory status and improving ambulation, complicated by small stable peritoneal abscesses not amenable to percutaneous drainageon recent follow-up CT imaging, now 16 Days Post-Ops/p ileostomy takedown and primary anastomosis, complicated by comorbidities includinga history ofHLD, anemia, history of colon cancer, andformer chronic tobaccoabuse (smoking).              - diet as tolerated  - pain control prn (minimize narcotics) - medical management as per medical team             - antibiotics ongoing for treatment of peritoneal abscesses - DVT prophylaxis, ambulation encouraged             - discharge planning soon  All of the above findings and recommendations were discussed with the patientandpatient's RN, and all of patient's questions were answered to her expressed satisfaction.  -- Marilynne Drivers Rosana Hoes, MD, [place]: [facility] - Partnering for  exceptional care. Office: [phone]

## 2018-07-13 MED ORDER — METRONIDAZOLE 500 MG PO TABS: 500.0000 mg | ORAL_TABLET | Freq: Three times a day (TID) | ORAL | 0 refills | Status: AC

## 2018-07-13 MED ORDER — OXYCODONE HCL 5 MG PO TABS: 5.0000 mg | ORAL_TABLET | Freq: Four times a day (QID) | ORAL | 0 refills | Status: DC | PRN

## 2018-07-13 MED ORDER — CIPROFLOXACIN HCL 500 MG PO TABS: 500.0000 mg | ORAL_TABLET | Freq: Two times a day (BID) | ORAL | 0 refills | Status: AC

## 2018-07-13 NOTE — Progress Notes (Signed)
SATURATION QUALIFICATIONS: (This note is used to comply with regulatory documentation for home oxygen)  Patient Saturations on Room Air at Rest = 83%   

## 2018-07-13 NOTE — Care Management Important Message (Signed)
Copy of signed IM left with patient in room.  

## 2018-07-13 NOTE — Discharge Summary (Signed)
Discharge Summary  Patient ID: Kimberly [name] MRN: [id number] DOB/AGE: [birth year] 72 y.o.  Admit date: 06/25/2018 Discharge date: 07/13/2018  Discharge Diagnoses   Small Bowel Obstruction - resolved  PNA   COPD with acute exacerbation - still requiring 1L via nasal cannula despite treatment with ABx, bronchodilators, inhaled corticosteroids, and mucolytics  Consultants Medicine Dietitian  Procedures 1. Colostomy takedown with ileocolostomy 2. Takedown of splenic flexure 3. Parastomal Hernia repair using 10x15cms Phasix ST mesh  HPI: Kimberly [name] is a 72 y.o. female who presented to [facility] on 09/26 for scheduled elective ileostomy takedown with Dr. Dahlia Byes. She was admitted for post-operative management.   Hospital Course: In the immediate post-operative period her diet was advanced to clears, however she developed hiccups and abdominal distension and was made NPO on 09/29. In that time, she also developed a worsening productive cough and was diagnosed with PNA and COPD exacerbation and started on ABx. On 10/01, she continued to have abdominal distension and decreased bowel activity. KUB at that time was concerning for SBO vs ileus and an NGT was placed. She was started on TPN on 10/02 and dietitian followed. She had two CT scans during her admission which showed a patent anastomosis and free fluid in the abdomen but no definitve abscess however she was treated empirically with Zosyn. On 10/10, her NGT was clamped and removed in the afternoon. Her TPN was weaned from 10/11-10/13. Her diet was advanced over the next few days and she continued to have flatus and bowel movements.   On the day of discharge (07/13/18), she was tolerating a diet, passing flatus and having bowel movements, and mobilizing well. She does continue to require 1L O2 given her COPD history despite treatment with bronchodilators, ICS, and mucolytics. Discussed discharge instructions, at-home care, and follow up. All her  questions and concerns were addressed and answered.   Physcial Exam:   Gen:  Alert, NAD, pleasant Card:  Regular rate and rhythm Pulm:  Normal effort, clear to auscultation bilaterally Abd: Soft, non-tender, non-distended, dull to percussion, incisions C/D/I Skin: warm and dry, no rashes  Psych: A&Ox3    Allergies as of 07/13/2018      Reactions   Betadine [povidone Iodine] Other (See Comments)   Topical iodine she states "if you put it in an open sore it will cause a eating ulcer."   Other Nausea And Vomiting   Antihystamines   Sinus Formula [cholestatin] Nausea Only      Medication List    STOP taking these medications   HYDROcodone-acetaminophen 7.5-325 MG tablet Commonly known as:  NORCO     TAKE these medications   budesonide-formoterol 80-4.5 MCG/ACT inhaler Commonly known as:  SYMBICORT Inhale 2 puffs into the lungs 2 (two) times daily.   cholecalciferol 1000 units tablet Commonly known as:  VITAMIN D Take 1,000 Units by mouth daily.   ciprofloxacin 500 MG tablet Commonly known as:  CIPRO Take 1 tablet (500 mg total) by mouth 2 (two) times daily for 7 days.   diphenhydrAMINE 50 MG tablet Commonly known as:  BENADRYL Take 1 tablet (50 mg total) by mouth once for 1 dose. Take 50mg  1 hour before contrast study.   feeding supplement Liqd Take 1 Container by mouth 3 (three) times daily between meals.   gabapentin 400 MG capsule Commonly known as:  NEURONTIN TAKE 1 CAPSULE TWICE DAILY   ibuprofen 200 MG tablet Commonly known as:  ADVIL,MOTRIN Take 400 mg by mouth daily as needed for headache  or moderate pain.   meloxicam 15 MG tablet Commonly known as:  MOBIC TAKE 1 TABLET EVERY DAY AS NEEDED What changed:  See the new instructions.   metroNIDAZOLE 500 MG tablet Commonly known as:  FLAGYL Take 1 tablet (500 mg total) by mouth 3 (three) times daily for 7 days.   montelukast 10 MG tablet Commonly known as:  SINGULAIR Take 1 tablet (10 mg total) by  mouth daily.   NASACORT ALLERGY 24HR NA Place 2 sprays into the nose daily.   omeprazole 20 MG capsule Commonly known as:  PRILOSEC TAKE 1 CAPSULE EVERY DAY   oxyCODONE 5 MG immediate release tablet Commonly known as:  Oxy IR/ROXICODONE Take 1 tablet (5 mg total) by mouth every 6 (six) hours as needed for severe pain.   pravastatin 40 MG tablet Commonly known as:  PRAVACHOL TAKE 1 TABLET EVERY DAY   PROBIOTIC DAILY PO Take 1 capsule by mouth daily.   pyridOXINE 50 MG tablet Commonly known as:  VITAMIN B-6 Take 1 tablet (50 mg total) by mouth daily.   vitamin B-12 1000 MCG tablet Commonly known as:  CYANOCOBALAMIN Take 1,000 mcg by mouth daily.            Durable Medical Equipment  (From admission, onward)         Start     Ordered   07/13/18 1123  For home use only DME oxygen  Once    Question Answer Comment  Mode or (Route) Nasal cannula   Liters per Minute 1   Frequency Continuous (stationary and portable oxygen unit needed)   Oxygen conserving device Yes   Oxygen delivery system Gas      07/13/18 1122   07/13/18 1123  For home use only DME Walker rolling  Once    Question Answer Comment  Patient needs a walker to treat with the following condition Weakness   Patient needs a walker to treat with the following condition Physical deconditioning      07/13/18 1122           Follow-up Information    P[place]on, [place] F, MD. Schedule an appointment as soon as possible for a visit in 1 week(s).   Specialty:  General Surgery Why:  s/p ileostomy takedown 09/26 with subsequent ileus...hospital follow up Contact information: [address] [place] [address] [phone]           Signed: Edison Simon , Hershal Coria [facility]  07/13/2018, 11:37 AM [phone] M-F: 7am - 4pm

## 2018-07-13 NOTE — Progress Notes (Signed)
Physical Therapy Treatment Patient Details Name: Kimberly [name] MRN: [id number] DOB: 04/28/1946 Today's Date: 07/13/2018    History of Present Illness admitted for acute hospitalization after procedure for colostomy takedown and parastomal hernia repair (9/26); post-op course complicated by acute hypoxic respiratory failure with PNA, ileus.    PT Comments    Patient is on room air upon PT arrival with 02 sat 83%. Patient is unable to improve this on room air with deep and pursed lip breathing through STS transfer. Patient remains 86% on 1L 02 with ambulation, and is able to maintain 02 >90% with 2L 02 with ambulation. Patient demonstrates safety with transfers and ambulation. PT lead patient through standing therex where patient was able to complete all sets with standing rest in between, and proper form/techniques following PT cuing.   SaO2 on room air at rest = 83% SaO2 on room air while ambulating = 83% SaO2 on 2L liters of O2 while ambulating = 90%    Follow Up Recommendations  Home health PT     Equipment Recommendations  Rolling walker with 5" wheels    Recommendations for Other Services       Precautions / Restrictions Precautions Precautions: Fall Precaution Comments: abdominal incisions Restrictions Weight Bearing Restrictions: No    Mobility  Bed Mobility Overal bed mobility: Modified Independent Bed Mobility: Supine to Sit     Supine to sit: Modified independent (Device/Increase time)     General bed mobility comments: Increased time needed  Transfers Overall transfer level: Modified independent Equipment used: Rolling walker (2 wheeled) Transfers: Sit to/from Stand Sit to Stand: Modified independent (Device/Increase time)         General transfer comment: Patient is able to demonstrate good safety with standing/hand placement with RW   Ambulation/Gait   Gait Distance (Feet): 250 Feet Assistive device: Rolling walker (2 wheeled) Gait  Pattern/deviations: Step-through pattern Gait velocity: decreased; 1.61ft/sec Gait velocity interpretation: <1.31 ft/sec, indicative of household ambulator General Gait Details: Patient ambulates safely with min cuing for foot clearance with 2 wheel walker. On room air 02 drops to 83% with standing and continues with walking with patient unable to raise with deep, pursed lip breathing; on 1L sats to 86%; on 2L 02 stays above 90%   Stairs             Wheelchair Mobility    Modified Rankin (Stroke Patients Only)       Balance           Standing balance support: Bilateral upper extremity supported Standing balance-Leahy Scale: Good Standing balance comment: relies on walker but no LOB                            Cognition                                              Exercises Other Exercises Other Exercises: standing at counter bilat hip abd, hip ext, and heel raises x10 each with cuing for posture; ambulation over 23ft with 02 monitored, and min cuing to prevent "shuffle steps". Sit<>stand transfers with patient demonstrating good safety precautions    General Comments        Pertinent Vitals/Pain Pain Assessment: No/denies pain Pain Score: 0-No pain Pain Location: abdominal incision    Home Living  Prior Function            PT Goals (current goals can now be found in the care plan section) Acute Rehab PT Goals Patient Stated Goal: to return home PT Goal Formulation: With patient Time For Goal Achievement: 07/17/18 Potential to Achieve Goals: Good Progress towards PT goals: Progressing toward goals    Frequency    Min 2X/week      PT Plan Current plan remains appropriate    Co-evaluation              AM-PAC PT "6 Clicks" Daily Activity  Outcome Measure  Difficulty turning over in bed (including adjusting bedclothes, sheets and blankets)?: None Difficulty moving from lying on  back to sitting on the side of the bed? : None Difficulty sitting down on and standing up from a chair with arms (e.g., wheelchair, bedside commode, etc,.)?: None Help needed moving to and from a bed to chair (including a wheelchair)?: None Help needed walking in hospital room?: None Help needed climbing 3-5 steps with a railing? : A Little 6 Click Score: 23    End of Session Equipment Utilized During Treatment: Gait belt;Oxygen Activity Tolerance: Patient tolerated treatment well Patient left: in chair;with call bell/phone within reach Nurse Communication: Mobility status(Min drainage of abdominal incision) PT Visit Diagnosis: Muscle weakness (generalized) (M62.81);Difficulty in walking, not elsewhere classified (R26.2)     Time: 4114-6431 PT Time Calculation (min) (ACUTE ONLY): 31 min  Charges:  $Gait Training: 8-22 mins $Therapeutic Exercise: 8-22 mins                     Shelton Silvas PT, DPT   Shelton Silvas 07/13/2018, 12:23 PM

## 2018-07-13 NOTE — Care Management Note (Signed)
Case Management Note  Patient Details  Name: EVAMAE ROWEN MRN: [id number] Date of Birth: [birth year]   Patient to discharge today.  Jason with [facility] delivered portable O2, and Rw to room prior to discharge.  Joelene Millin with [facility] notified of discharge.   RNCM signing off  Subjective/Objective:                    Action/Plan:   Expected Discharge Date:  07/13/18               Expected Discharge Plan:  [place]  In-House Referral:     Discharge planning Services  CM Consult  Post Acute Care Choice:  Home Health, Durable Medical Equipment Choice offered to:  Patient  DME Arranged:  Oxygen, Walker rolling DME Agency:  [place]:  RN, PT [facility] Agency:     Status of Service:  Completed, signed off  If discussed at [place] of Stay Meetings, dates discussed:    Additional Comments:  Beverly Sessions, RN 07/13/2018, 3:19 PM

## 2018-07-13 NOTE — Progress Notes (Signed)
Patient cleared for discharge. PICC removed. Pt c/o increased draining around incision and redness. Before dressing, Called PA Zach to confirm incision was not infected. Confirmed but told patient to monitor closely for signs of infection. Instructions and prescriptions given. Patient belongings gathered. Discharged to home Via POV.

## 2018-07-15 DIAGNOSIS — D509 Iron deficiency anemia, unspecified: Secondary | ICD-10-CM | POA: Diagnosis not present

## 2018-07-15 DIAGNOSIS — M170 Bilateral primary osteoarthritis of knee: Secondary | ICD-10-CM | POA: Diagnosis not present

## 2018-07-15 DIAGNOSIS — G629 Polyneuropathy, unspecified: Secondary | ICD-10-CM | POA: Diagnosis not present

## 2018-07-15 DIAGNOSIS — M5136 Other intervertebral disc degeneration, lumbar region: Secondary | ICD-10-CM | POA: Diagnosis not present

## 2018-07-15 DIAGNOSIS — G8929 Other chronic pain: Secondary | ICD-10-CM | POA: Diagnosis not present

## 2018-07-15 DIAGNOSIS — E785 Hyperlipidemia, unspecified: Secondary | ICD-10-CM | POA: Diagnosis not present

## 2018-07-15 DIAGNOSIS — E559 Vitamin D deficiency, unspecified: Secondary | ICD-10-CM | POA: Diagnosis not present

## 2018-07-15 DIAGNOSIS — M810 Age-related osteoporosis without current pathological fracture: Secondary | ICD-10-CM | POA: Diagnosis not present

## 2018-07-15 DIAGNOSIS — K435 Parastomal hernia without obstruction or  gangrene: Secondary | ICD-10-CM | POA: Diagnosis not present

## 2018-07-17 DIAGNOSIS — E785 Hyperlipidemia, unspecified: Secondary | ICD-10-CM | POA: Diagnosis not present

## 2018-07-17 DIAGNOSIS — M170 Bilateral primary osteoarthritis of knee: Secondary | ICD-10-CM | POA: Diagnosis not present

## 2018-07-17 DIAGNOSIS — M5136 Other intervertebral disc degeneration, lumbar region: Secondary | ICD-10-CM | POA: Diagnosis not present

## 2018-07-17 DIAGNOSIS — G8929 Other chronic pain: Secondary | ICD-10-CM | POA: Diagnosis not present

## 2018-07-17 DIAGNOSIS — E559 Vitamin D deficiency, unspecified: Secondary | ICD-10-CM | POA: Diagnosis not present

## 2018-07-17 DIAGNOSIS — K435 Parastomal hernia without obstruction or  gangrene: Secondary | ICD-10-CM | POA: Diagnosis not present

## 2018-07-17 DIAGNOSIS — D509 Iron deficiency anemia, unspecified: Secondary | ICD-10-CM | POA: Diagnosis not present

## 2018-07-17 DIAGNOSIS — G629 Polyneuropathy, unspecified: Secondary | ICD-10-CM | POA: Diagnosis not present

## 2018-07-17 DIAGNOSIS — M810 Age-related osteoporosis without current pathological fracture: Secondary | ICD-10-CM | POA: Diagnosis not present

## 2018-07-20 ENCOUNTER — Ambulatory Visit (INDEPENDENT_AMBULATORY_CARE_PROVIDER_SITE_OTHER): Payer: Medicare HMO | Admitting: Surgery

## 2018-07-20 ENCOUNTER — Encounter: Payer: Self-pay | Admitting: Surgery

## 2018-07-20 ENCOUNTER — Other Ambulatory Visit: Payer: Self-pay

## 2018-07-20 VITALS — BP 147/83 | HR 94 | Temp 97.7°F | Resp 18 | Ht 64.0 in | Wt 162.0 lb

## 2018-07-20 DIAGNOSIS — Z09 Encounter for follow-up examination after completed treatment for conditions other than malignant neoplasm: Secondary | ICD-10-CM

## 2018-07-20 NOTE — Progress Notes (Signed)
S/p colostomy takedown 9/26 Doing well Still wearing O2 Taking po, no fevers, + BM and flatus Path d/w pt C/P some abd pain on previous left MF Ambulating w walker  PE NAD, AVSS Abd: soft, mild TTp Left MF incision at lateral corner, some erythema, probed w q tip and 1 cc pus was drained. Intact fascia, Other incision healing well, no peritonitis  A/p Doing well Dr. Caryn Section to evaluate for pulm care and to wean off O2 RTC 2 weeks

## 2018-07-20 NOTE — Patient Instructions (Addendum)
Please remove the dressing and wash the area with soap and water daily. Please apply a clean dressing daily.  We will schedule an appointment with Dr.Fisher for your breathing and oxygen use.

## 2018-07-21 DIAGNOSIS — K435 Parastomal hernia without obstruction or  gangrene: Secondary | ICD-10-CM | POA: Diagnosis not present

## 2018-07-21 DIAGNOSIS — M5136 Other intervertebral disc degeneration, lumbar region: Secondary | ICD-10-CM | POA: Diagnosis not present

## 2018-07-21 DIAGNOSIS — M810 Age-related osteoporosis without current pathological fracture: Secondary | ICD-10-CM | POA: Diagnosis not present

## 2018-07-21 DIAGNOSIS — D509 Iron deficiency anemia, unspecified: Secondary | ICD-10-CM | POA: Diagnosis not present

## 2018-07-21 DIAGNOSIS — G8929 Other chronic pain: Secondary | ICD-10-CM | POA: Diagnosis not present

## 2018-07-21 DIAGNOSIS — G629 Polyneuropathy, unspecified: Secondary | ICD-10-CM | POA: Diagnosis not present

## 2018-07-21 DIAGNOSIS — E785 Hyperlipidemia, unspecified: Secondary | ICD-10-CM | POA: Diagnosis not present

## 2018-07-21 DIAGNOSIS — E559 Vitamin D deficiency, unspecified: Secondary | ICD-10-CM | POA: Diagnosis not present

## 2018-07-21 DIAGNOSIS — M170 Bilateral primary osteoarthritis of knee: Secondary | ICD-10-CM | POA: Diagnosis not present

## 2018-07-22 ENCOUNTER — Encounter: Payer: Self-pay | Admitting: Radiation Oncology

## 2018-07-22 ENCOUNTER — Other Ambulatory Visit: Payer: Self-pay

## 2018-07-22 ENCOUNTER — Telehealth: Payer: Self-pay | Admitting: Family Medicine

## 2018-07-22 ENCOUNTER — Ambulatory Visit
Admission: RE | Admit: 2018-07-22 | Discharge: 2018-07-22 | Disposition: A | Discharge status: Home / Self Care | Payer: Medicare HMO | Source: Ambulatory Visit | Attending: Radiation Oncology | Admitting: Radiation Oncology

## 2018-07-22 VITALS — BP 175/89 | HR 103 | Temp 98.3°F | Resp 18 | Wt 162.1 lb

## 2018-07-22 DIAGNOSIS — Z923 Personal history of irradiation: Secondary | ICD-10-CM | POA: Diagnosis not present

## 2018-07-22 DIAGNOSIS — Z87891 Personal history of nicotine dependence: Secondary | ICD-10-CM | POA: Diagnosis not present

## 2018-07-22 DIAGNOSIS — C184 Malignant neoplasm of transverse colon: Secondary | ICD-10-CM | POA: Diagnosis not present

## 2018-07-22 DIAGNOSIS — C779 Secondary and unspecified malignant neoplasm of lymph node, unspecified: Secondary | ICD-10-CM | POA: Diagnosis not present

## 2018-07-22 DIAGNOSIS — M5136 Other intervertebral disc degeneration, lumbar region: Secondary | ICD-10-CM

## 2018-07-22 MED ORDER — HYDROCODONE-ACETAMINOPHEN 7.5-325 MG PO TABS: 1.0000 | ORAL_TABLET | Freq: Four times a day (QID) | ORAL | 0 refills | Status: DC | PRN

## 2018-07-22 NOTE — Progress Notes (Signed)
Radiation Oncology Follow up Note  Name: Kimberly [name]   Date:   07/22/2018 MRN:  [id number] DOB: 26-Aug-1946    This 72 y.o. 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 She's had her colostomy reversed. She is doing well. She states her bowel movements are within normal limits..recent CT scan showpostoperative changes from ileostomy reversal and partial colectomywith some continued dilated small bowel loops. She's having no blood per rectum.  COMPLICATIONS OF TREATMENT: none  FOLLOW UP COMPLIANCE: keeps appointments   PHYSICAL EXAM:  BP (!) 175/89 (BP Location: Left Arm, Patient Position: Sitting)   Pulse (!) 103   Temp 98.3 F (36.8 C) (Tympanic)   Resp 18   Wt 162 lb 2.4 oz (73.5 kg)   BMI 27.83 kg/m  Ileostomy has been reversedWell-developed well-nourished patient in NAD. HEENT reveals PERLA, EOMI, discs not visualized.  Oral cavity is clear. No oral mucosal lesions are identified. Neck is clear without evidence of cervical or supraclavicular adenopathy. Lungs are clear to A&P. Cardiac examination is essentially unremarkable with regular rate and rhythm without murmur rub or thrill. Abdomen is benign with no organomegaly or masses noted. Motor sensory and DTR levels are equal and symmetric in the upper and lower extremities. Cranial nerves II through XII are grossly intact. Proprioception is intact. No peripheral adenopathy or edema is identified. No motor or sensory levels are noted. Crude visual fields are within normal range.  RADIOLOGY RESULTS: CT scans are reviewed and  compatible above-stated findings  PLAN: the present time patient is doing well postop her ileostomy reversal. I'm please were overall progress. She is eating well no problems with her bowel movements. I'm please were overall progress. I've asked to see her back in 6 months for follow-up. Patient is to call sooner with any concerns.  I would like to take this opportunity to thank you for allowing me to participate in the care of your patient.Noreene Filbert, MD

## 2018-07-22 NOTE — Telephone Encounter (Signed)
Pt wanting a refill on the hydrocodone.   Pt doesn't want to take the oxyCODONE (OXY IR/ROXICODONE) 5 MG immediate release tablet the hospital put her on.  Please fill at:   [id number] - [place], [place] - [address] [phone] (Phone) [phone] (Fax)   Please call pt if any questions or needing to discuss.  Thanks, [employer]

## 2018-07-22 NOTE — Telephone Encounter (Signed)
Patient was taking off of the Hydrocodone medication on 07/13/2018 due to been put on the Oxycodone 5mg  from hospital. Please advise.

## 2018-07-24 DIAGNOSIS — K435 Parastomal hernia without obstruction or  gangrene: Secondary | ICD-10-CM | POA: Diagnosis not present

## 2018-07-24 DIAGNOSIS — M170 Bilateral primary osteoarthritis of knee: Secondary | ICD-10-CM | POA: Diagnosis not present

## 2018-07-24 DIAGNOSIS — G8929 Other chronic pain: Secondary | ICD-10-CM | POA: Diagnosis not present

## 2018-07-24 DIAGNOSIS — E785 Hyperlipidemia, unspecified: Secondary | ICD-10-CM | POA: Diagnosis not present

## 2018-07-24 DIAGNOSIS — D509 Iron deficiency anemia, unspecified: Secondary | ICD-10-CM | POA: Diagnosis not present

## 2018-07-24 DIAGNOSIS — G629 Polyneuropathy, unspecified: Secondary | ICD-10-CM | POA: Diagnosis not present

## 2018-07-24 DIAGNOSIS — E559 Vitamin D deficiency, unspecified: Secondary | ICD-10-CM | POA: Diagnosis not present

## 2018-07-24 DIAGNOSIS — M810 Age-related osteoporosis without current pathological fracture: Secondary | ICD-10-CM | POA: Diagnosis not present

## 2018-07-24 DIAGNOSIS — M5136 Other intervertebral disc degeneration, lumbar region: Secondary | ICD-10-CM | POA: Diagnosis not present

## 2018-07-27 ENCOUNTER — Ambulatory Visit (INDEPENDENT_AMBULATORY_CARE_PROVIDER_SITE_OTHER): Payer: Medicare HMO | Admitting: Family Medicine

## 2018-07-27 ENCOUNTER — Encounter: Payer: Self-pay | Admitting: Family Medicine

## 2018-07-27 VITALS — BP 138/68 | HR 92 | Temp 99.0°F | Resp 16 | Wt 162.0 lb

## 2018-07-27 DIAGNOSIS — R0902 Hypoxemia: Secondary | ICD-10-CM

## 2018-07-27 DIAGNOSIS — J441 Chronic obstructive pulmonary disease with (acute) exacerbation: Secondary | ICD-10-CM

## 2018-07-27 NOTE — Progress Notes (Signed)
Patient: Kimberly [name] Female    DOB: 1946-09-11   72 y.o.   MRN: [id number] Visit Date: 07/27/2018  Today's Provider: Lelon Huh, MD   Chief Complaint  Patient presents with  . Hospitalization Follow-up   Subjective:    HPI  Follow up Hospitalization  Patient was admitted to [facility] on 06/25/2018 and discharged on 07/13/2018. She was treated for Malignant neoplasm of the colon, small bowel obstruction and COPD exacerbation. Patient underwent Colostomy takedown with ileocolostomy, Takedown of splenic flexure and Parastomal Hernia repair using 10x15cms Phasix ST mesh. Patient was admitted for post operative management. Upon discharge patient still required 1 Liter 02 nasal canula for COPD exacerbation despite treatment with antibiotics, bronchodilators, inhaler corticosteroids and mucolytics. Since being discharged from the hospital patient was seen by surgeon as out patient on 07/20/2018. Patient was advised to folllow up with her PCP to evaluate pulmonary care and possibly weaning off oxygen.  She reports good compliance with treatment. She reports this condition is Improved. Patient still has some abdominal pain. She is supposed to be on continuous oxygen 1L nasal canula, but patient comes in the office today without any oxygen. She states she left it at home. Patient denies any shortness of breath.   ------------------------------------------------------------------------------------       Allergies  Allergen Reactions  . Betadine [Povidone Iodine] Other (See Comments)    Topical iodine she states "if you put it in an open sore it will cause a eating ulcer."  . Other Nausea And Vomiting    Antihystamines  . Sinus Formula [Cholestatin] Nausea Only     Current Outpatient Medications:  .  budesonide-formoterol (SYMBICORT) 80-4.5 MCG/ACT inhaler, Inhale 2 puffs into the lungs 2 (two) times daily., Disp: 3 Inhaler, Rfl: 3 .  cholecalciferol (VITAMIN D) 1000 units  tablet, Take 1,000 Units by mouth daily., Disp: , Rfl:  .  feeding supplement (BOOST / RESOURCE BREEZE) LIQD, Take 1 Container by mouth 3 (three) times daily between meals., Disp: 30 Container, Rfl: 0 .  gabapentin (NEURONTIN) 400 MG capsule, TAKE 1 CAPSULE TWICE DAILY (Patient taking differently: Take 400 mg by mouth 2 (two) times daily. ), Disp: 180 capsule, Rfl: 4 .  HYDROcodone-acetaminophen (NORCO) 7.5-325 MG tablet, Take 1-2 tablets by mouth every 6 (six) hours as needed for moderate pain., Disp: 120 tablet, Rfl: 0 .  ibuprofen (ADVIL,MOTRIN) 200 MG tablet, Take 400 mg by mouth daily as needed for headache or moderate pain., Disp: , Rfl:  .  meloxicam (MOBIC) 15 MG tablet, TAKE 1 TABLET EVERY DAY AS NEEDED (Patient taking differently: Take 15 mg by mouth daily as needed for pain. ), Disp: 90 tablet, Rfl: 4 .  montelukast (SINGULAIR) 10 MG tablet, Take 1 tablet (10 mg total) by mouth daily., Disp: 90 tablet, Rfl: 1 .  omeprazole (PRILOSEC) 20 MG capsule, TAKE 1 CAPSULE EVERY DAY (Patient taking differently: Take 20 mg by mouth daily. ), Disp: 90 capsule, Rfl: 4 .  pravastatin (PRAVACHOL) 40 MG tablet, TAKE 1 TABLET EVERY DAY (Patient taking differently: Take 40 mg by mouth daily. ), Disp: 90 tablet, Rfl: 3 .  Probiotic Product (PROBIOTIC DAILY PO), Take 1 capsule by mouth daily. , Disp: , Rfl:  .  pyridOXINE (VITAMIN B-6) 50 MG tablet, Take 1 tablet (50 mg total) by mouth daily., Disp: 30 tablet, Rfl: 3 .  Triamcinolone Acetonide (NASACORT ALLERGY 24HR NA), Place 2 sprays into the nose daily. , Disp: , Rfl:  .  vitamin B-12 (CYANOCOBALAMIN) 1000 MCG tablet, Take 1,000 mcg by mouth daily., Disp: , Rfl:  No current facility-administered medications for this visit.   Facility-Administered Medications Ordered in Other Visits:  .  0.9 %  sodium chloride infusion, , Intravenous, Continuous, Earlie Server, MD, Stopped at 07/22/17 1150 .  heparin lock flush 100 unit/mL, 500 Units, Intravenous, Once, Earlie Server, MD .  heparin lock flush 100 unit/mL, 500 Units, Intravenous, Once, Earlie Server, MD .  heparin lock flush 100 unit/mL, 500 Units, Intravenous, Once, Earlie Server, MD .  sodium chloride flush (NS) 0.9 % injection 10 mL, 10 mL, Intracatheter, PRN, Earlie Server, MD .  sodium chloride flush (NS) 0.9 % injection 10 mL, 10 mL, Intravenous, PRN, Earlie Server, MD, 10 mL at 05/29/17 1347  Review of Systems  Constitutional: Negative for appetite change, chills, fatigue and fever.  Respiratory: Negative for chest tightness and shortness of breath.   Cardiovascular: Negative for chest pain and palpitations.  Gastrointestinal: Positive for abdominal pain. Negative for nausea and vomiting.  Neurological: Negative for dizziness and weakness.    Social History   Tobacco Use  . Smoking status: Former Smoker    Packs/day: 0.25    Years: 55.00    Pack years: 13.75    Types: Cigarettes    Last attempt to quit: 05/21/2017    Years since quitting: 1.1  . Smokeless tobacco: Never Used  . Tobacco comment: started age 42 1/2 to 1 ppd  Substance Use Topics  . Alcohol use: Yes    Alcohol/week: 0.0 standard drinks    Comment: occasionally drinks beer   Objective:   BP 138/68 (BP Location: Left Arm, Patient Position: Sitting, Cuff Size: Large)   Pulse 92   Temp 99 F (37.2 C) (Oral)   Resp 16   Wt 162 lb (73.5 kg)   SpO2 (!) 85%   BMI 27.81 kg/m  Vitals:   07/27/18 1606 07/27/18 1622  BP: 138/68   Pulse: 92   Resp: 16   Temp: 99 F (37.2 C)   TempSrc: Oral   SpO2: 95% (!) 85%  Weight: 162 lb (73.5 kg)   SpO2: 95% room air at rest Sp02: 85% (room air/ walking) SpO2: 92% ambulating with 2lpm O2  Physical Exam   General Appearance:    Alert, cooperative, no distress  Eyes:    PERRL, conjunctiva/corneas clear, EOM's intact       Lungs:     Clear to auscultation bilaterally, respirations unlabored  Heart:    Regular rate and rhythm  Neurologic:   Awake, alert, oriented x 3. No apparent focal  neurological           defect.           Assessment & Plan:     .1. Hypoxia   2. COPD exacerbation ([place])  Continues to require supplement O2 at 2 lpm when ambulating. Overnight oximetry ordered to assess need for nocturnal oximetry.        Lelon Huh, MD  [facility]

## 2018-07-28 ENCOUNTER — Telehealth: Payer: Self-pay | Admitting: Family Medicine

## 2018-07-28 DIAGNOSIS — J449 Chronic obstructive pulmonary disease, unspecified: Secondary | ICD-10-CM

## 2018-07-28 DIAGNOSIS — M810 Age-related osteoporosis without current pathological fracture: Secondary | ICD-10-CM | POA: Diagnosis not present

## 2018-07-28 DIAGNOSIS — M170 Bilateral primary osteoarthritis of knee: Secondary | ICD-10-CM | POA: Diagnosis not present

## 2018-07-28 DIAGNOSIS — M5136 Other intervertebral disc degeneration, lumbar region: Secondary | ICD-10-CM | POA: Diagnosis not present

## 2018-07-28 DIAGNOSIS — K435 Parastomal hernia without obstruction or  gangrene: Secondary | ICD-10-CM | POA: Diagnosis not present

## 2018-07-28 DIAGNOSIS — R0902 Hypoxemia: Secondary | ICD-10-CM

## 2018-07-28 DIAGNOSIS — E785 Hyperlipidemia, unspecified: Secondary | ICD-10-CM | POA: Diagnosis not present

## 2018-07-28 DIAGNOSIS — G629 Polyneuropathy, unspecified: Secondary | ICD-10-CM | POA: Diagnosis not present

## 2018-07-28 DIAGNOSIS — D509 Iron deficiency anemia, unspecified: Secondary | ICD-10-CM | POA: Diagnosis not present

## 2018-07-28 DIAGNOSIS — E559 Vitamin D deficiency, unspecified: Secondary | ICD-10-CM | POA: Diagnosis not present

## 2018-07-28 DIAGNOSIS — G8929 Other chronic pain: Secondary | ICD-10-CM | POA: Diagnosis not present

## 2018-07-28 NOTE — Telephone Encounter (Signed)
Pt states [facility] [phone]) is the home health agency that comes out and does health checks for her.

## 2018-07-28 NOTE — Telephone Encounter (Signed)
Dr. Caryn Section, did you need this information?

## 2018-07-28 NOTE — Addendum Note (Signed)
Addended by: Lelon Huh E on: 07/28/2018 11:11 AM   Modules accepted: Orders

## 2018-07-29 ENCOUNTER — Telehealth: Payer: Self-pay | Admitting: Family Medicine

## 2018-07-29 NOTE — Telephone Encounter (Signed)
Order faxed.

## 2018-07-29 NOTE — Telephone Encounter (Signed)
Prescription written, please fax

## 2018-07-29 NOTE — Telephone Encounter (Signed)
Pt's oxygen tank that was given to her to use from [place] is heavy and big. Pt is asking Dr Caryn Section to write a Rx for her to receive a portable tank -  best fit evaluation.  [place] Fax [phone].  Please advise.  Thanks, [employer]

## 2018-07-30 ENCOUNTER — Inpatient Hospital Stay
Admission: EM | Admit: 2018-07-30 | Discharge: 2018-08-01 | DRG: 373 | Disposition: A | Discharge status: Home Health | Payer: Medicare HMO | Attending: Internal Medicine | Admitting: Internal Medicine

## 2018-07-30 ENCOUNTER — Telehealth: Payer: Self-pay | Admitting: Family Medicine

## 2018-07-30 ENCOUNTER — Encounter: Payer: Self-pay | Admitting: *Deleted

## 2018-07-30 ENCOUNTER — Other Ambulatory Visit: Payer: Self-pay

## 2018-07-30 ENCOUNTER — Emergency Department: Payer: Medicare HMO

## 2018-07-30 ENCOUNTER — Telehealth: Payer: Self-pay | Admitting: General Surgery

## 2018-07-30 ENCOUNTER — Encounter: Payer: Self-pay | Admitting: Family Medicine

## 2018-07-30 DIAGNOSIS — Z79899 Other long term (current) drug therapy: Secondary | ICD-10-CM | POA: Diagnosis not present

## 2018-07-30 DIAGNOSIS — Z87891 Personal history of nicotine dependence: Secondary | ICD-10-CM | POA: Diagnosis not present

## 2018-07-30 DIAGNOSIS — Z923 Personal history of irradiation: Secondary | ICD-10-CM | POA: Diagnosis not present

## 2018-07-30 DIAGNOSIS — Z853 Personal history of malignant neoplasm of breast: Secondary | ICD-10-CM | POA: Diagnosis not present

## 2018-07-30 DIAGNOSIS — Z791 Long term (current) use of non-steroidal anti-inflammatories (NSAID): Secondary | ICD-10-CM

## 2018-07-30 DIAGNOSIS — Z825 Family history of asthma and other chronic lower respiratory diseases: Secondary | ICD-10-CM | POA: Diagnosis not present

## 2018-07-30 DIAGNOSIS — E785 Hyperlipidemia, unspecified: Secondary | ICD-10-CM | POA: Diagnosis not present

## 2018-07-30 DIAGNOSIS — Z800 Family history of malignant neoplasm of digestive organs: Secondary | ICD-10-CM | POA: Diagnosis not present

## 2018-07-30 DIAGNOSIS — J449 Chronic obstructive pulmonary disease, unspecified: Secondary | ICD-10-CM | POA: Diagnosis present

## 2018-07-30 DIAGNOSIS — Z8249 Family history of ischemic heart disease and other diseases of the circulatory system: Secondary | ICD-10-CM | POA: Diagnosis not present

## 2018-07-30 DIAGNOSIS — Z85038 Personal history of other malignant neoplasm of large intestine: Secondary | ICD-10-CM | POA: Diagnosis not present

## 2018-07-30 DIAGNOSIS — G629 Polyneuropathy, unspecified: Secondary | ICD-10-CM | POA: Diagnosis not present

## 2018-07-30 DIAGNOSIS — Z888 Allergy status to other drugs, medicaments and biological substances status: Secondary | ICD-10-CM

## 2018-07-30 DIAGNOSIS — Z833 Family history of diabetes mellitus: Secondary | ICD-10-CM

## 2018-07-30 DIAGNOSIS — A0472 Enterocolitis due to Clostridium difficile, not specified as recurrent: Principal | ICD-10-CM | POA: Diagnosis present

## 2018-07-30 DIAGNOSIS — R1084 Generalized abdominal pain: Secondary | ICD-10-CM | POA: Diagnosis not present

## 2018-07-30 DIAGNOSIS — M810 Age-related osteoporosis without current pathological fracture: Secondary | ICD-10-CM | POA: Diagnosis present

## 2018-07-30 DIAGNOSIS — Z9049 Acquired absence of other specified parts of digestive tract: Secondary | ICD-10-CM

## 2018-07-30 DIAGNOSIS — I2510 Atherosclerotic heart disease of native coronary artery without angina pectoris: Secondary | ICD-10-CM | POA: Diagnosis not present

## 2018-07-30 DIAGNOSIS — K56609 Unspecified intestinal obstruction, unspecified as to partial versus complete obstruction: Secondary | ICD-10-CM | POA: Diagnosis not present

## 2018-07-30 DIAGNOSIS — Z7951 Long term (current) use of inhaled steroids: Secondary | ICD-10-CM

## 2018-07-30 DIAGNOSIS — K219 Gastro-esophageal reflux disease without esophagitis: Secondary | ICD-10-CM | POA: Diagnosis not present

## 2018-07-30 DIAGNOSIS — M5136 Other intervertebral disc degeneration, lumbar region: Secondary | ICD-10-CM | POA: Diagnosis not present

## 2018-07-30 DIAGNOSIS — E559 Vitamin D deficiency, unspecified: Secondary | ICD-10-CM | POA: Diagnosis present

## 2018-07-30 DIAGNOSIS — Z86010 Personal history of colonic polyps: Secondary | ICD-10-CM | POA: Diagnosis not present

## 2018-07-30 LAB — URINALYSIS, COMPLETE (UACMP) WITH MICROSCOPIC
BILIRUBIN URINE: NEGATIVE
Bacteria, UA: NONE SEEN
Glucose, UA: NEGATIVE mg/dL
HGB URINE DIPSTICK: NEGATIVE
KETONES UR: NEGATIVE mg/dL
LEUKOCYTES UA: NEGATIVE
NITRITE: NEGATIVE
PH: 6 (ref 5.0–8.0)
Protein, ur: NEGATIVE mg/dL
SPECIFIC GRAVITY, URINE: 1.004 — AB (ref 1.005–1.030)

## 2018-07-30 LAB — GASTROINTESTINAL PANEL BY PCR, STOOL (REPLACES STOOL CULTURE)

## 2018-07-30 LAB — COMPREHENSIVE METABOLIC PANEL
ALT: 11 U/L (ref 0–44)
ANION GAP: 11 (ref 5–15)
AST: 20 U/L (ref 15–41)
Albumin: 3.1 g/dL — ABNORMAL LOW (ref 3.5–5.0)
Alkaline Phosphatase: 82 U/L (ref 38–126)
BILIRUBIN TOTAL: 0.6 mg/dL (ref 0.3–1.2)
BUN: 20 mg/dL (ref 8–23)
CHLORIDE: 95 mmol/L — AB (ref 98–111)
CO2: 24 mmol/L (ref 22–32)
Calcium: 8.5 mg/dL — ABNORMAL LOW (ref 8.9–10.3)
Creatinine, Ser: 0.7 mg/dL (ref 0.44–1.00)
Glucose, Bld: 168 mg/dL — ABNORMAL HIGH (ref 70–99)
POTASSIUM: 3.9 mmol/L (ref 3.5–5.1)
Sodium: 130 mmol/L — ABNORMAL LOW (ref 135–145)
TOTAL PROTEIN: 7.5 g/dL (ref 6.5–8.1)

## 2018-07-30 LAB — CBC
HEMATOCRIT: 38.5 % (ref 36.0–46.0)
HEMOGLOBIN: 12.8 g/dL (ref 12.0–15.0)
MCH: 29.9 pg (ref 26.0–34.0)
MCHC: 33.2 g/dL (ref 30.0–36.0)
MCV: 90 fL (ref 80.0–100.0)
Platelets: 506 10*3/uL — ABNORMAL HIGH (ref 150–400)
RBC: 4.28 MIL/uL (ref 3.87–5.11)
RDW: 14.6 % (ref 11.5–15.5)
WBC: 27 10*3/uL — AB (ref 4.0–10.5)
nRBC: 0 % (ref 0.0–0.2)

## 2018-07-30 LAB — LIPASE, BLOOD: LIPASE: 20 U/L (ref 11–51)

## 2018-07-30 LAB — C DIFFICILE QUICK SCREEN W PCR REFLEX
C DIFFICLE (CDIFF) ANTIGEN: POSITIVE — AB
C Diff interpretation: DETECTED
C Diff toxin: POSITIVE — AB

## 2018-07-30 MED ORDER — METRONIDAZOLE IN NACL 5-0.79 MG/ML-% IV SOLN
500.0000 mg | Freq: Once | INTRAVENOUS | Status: AC
  Administered 2018-07-30: 500 mg via INTRAVENOUS
  Filled 2018-07-30: qty 100

## 2018-07-30 MED ORDER — SODIUM CHLORIDE 0.9 % IV BOLUS
1000.0000 mL | Freq: Once | INTRAVENOUS | Status: AC
  Administered 2018-07-30: 1000 mL via INTRAVENOUS

## 2018-07-30 MED ORDER — IOHEXOL 300 MG/ML  SOLN
30.0000 mL | Freq: Once | INTRAMUSCULAR | Status: AC | PRN
  Administered 2018-07-30: 30 mL via ORAL

## 2018-07-30 MED ORDER — ONDANSETRON HCL 4 MG/2ML IJ SOLN
4.0000 mg | Freq: Once | INTRAMUSCULAR | Status: AC
  Administered 2018-07-30: 4 mg via INTRAVENOUS
  Filled 2018-07-30: qty 2

## 2018-07-30 MED ORDER — VANCOMYCIN 50 MG/ML ORAL SOLUTION
125.0000 mg | Freq: Four times a day (QID) | ORAL | Status: DC
  Administered 2018-07-31 – 2018-08-01 (×6): 125 mg via ORAL
  Filled 2018-07-30 (×9): qty 2.5

## 2018-07-30 MED ORDER — IOPAMIDOL (ISOVUE-300) INJECTION 61%
100.0000 mL | Freq: Once | INTRAVENOUS | Status: AC | PRN
  Administered 2018-07-30: 100 mL via INTRAVENOUS

## 2018-07-30 NOTE — ED Triage Notes (Signed)
Pt to ED reporting generalized abd pain with new onset of diarrhea and vomiting today. Pt reporting 20+ episodes of diarrhea. Pt finished antibiotics last week and had a colon reconstruction surgery last week. No fevers at home but pt reporting she has started to feel febrile. Afebrile in triage.

## 2018-07-30 NOTE — ED Provider Notes (Signed)
[facility] Emergency Department Provider Note   ____________________________________________   First MD Initiated Contact with Patient 07/30/18 2043     (approximate)  I have reviewed the triage vital signs and the nursing notes.   HISTORY  Chief Complaint Abdominal Pain  HPI Kimberly [name] is a 72 y.o. female patient had takedown of colostomy done on 9/26.  She received antibiotics for this.  Since then she is been having a lot of abdominal pain and diarrhea.  She had diarrhea 20 times in the last day.  Is not running a fever.  Her belly pain is moderately severe.  Is diffuse somewhat crampy.  Been going on for about 2 days.  Is different than the surgical pain.   Past Medical History:  Diagnosis Date  . Breast cancer, left (HCC)    Lumpectomy and rad tx's.  . Chronic back pain   . Colon cancer ([place])   . COPD (chronic obstructive pulmonary disease) ([place])   . Degenerative disc disease, lumbar 04/2013  . Dyspnea   . Family history of adverse reaction to anesthesia    son arrested after anesthesia  . GERD (gastroesophageal reflux disease)   . Hyperlipidemia   . Iron deficiency anemia 05/27/2017  . Neuropathy   . Osteoarthritis    of knee  . Osteoporosis   . Small bowel obstruction ([place]) 04/16/2017  . Tobacco abuse   . Vitamin D deficiency     Patient Active Problem List   Diagnosis Date Noted  . S/P colostomy takedown 06/25/2018  . Parastomal hernia without obstruction or gangrene   . Status post colon resection   . Visit for diagnostic endoscopy   . Colostomy status ([place])   . Colostomy prolapse ([place])   . Hypokalemia 05/27/2017  . Hypomagnesemia 05/27/2017  . Iron deficiency anemia 05/27/2017  . Port-A-Cath in place   . Left peroneal vein thrombosis   . Malignant neoplasm of transverse colon ([place]) 04/26/2017  . Cellulitis   . Generalized abdominal pain   . Abdominal pain, generalized   . Loss of weight   . Gastritis without bleeding     . Absence of bladder continence 04/01/2017  . Asthmatic breathing 04/01/2017  . Abnormal abdominal x-ray 02/11/2017  . OP (osteoporosis) 02/11/2017  . Skin lesion 02/11/2017  . Breast swelling 02/11/2017  . Narrowing of intervertebral disc space 02/11/2017  . Family history of colon cancer 02/11/2017  . Foot pain 02/11/2017  . Arthralgia of hip 02/11/2017  . Personal history of malignant neoplasm of breast 02/11/2017  . Eczema intertrigo 02/11/2017  . Neuropathy 02/11/2017  . Osteoarthrosis, unspecified whether generalized or localized, involving lower leg 02/11/2017  . Coronary atherosclerosis 01/29/2017  . Smoking greater than 30 pack years 01/10/2017  . Vitamin D deficiency 08/21/2016  . Hyperlipidemia 11/10/2015  . Chronic back pain 05/26/2015  . DDD (degenerative disc disease), lumbosacral 05/26/2015  . History of colon polyps 04/27/2007    Past Surgical History:  Procedure Laterality Date  . APPENDECTOMY  1965  . BREAST EXCISIONAL BIOPSY Left 08/01/2003   lumpectomy rad 11/04-2/28/2005  . BREAST SURGERY    . CESAREAN SECTION     x3  . COLON SURGERY    . COLONOSCOPY W/ POLYPECTOMY    . COLONOSCOPY WITH PROPOFOL N/A 04/09/2018   Procedure: COLONOSCOPY WITH PROPOFOL;  Surgeon: Virgel Manifold, MD;  Location: [facility] END[facility]COPY;  Service: Gastroenterology;  Laterality: N/A;  . COLOSTOMY TAKEDOWN N/A 06/25/2018   Procedure: COLOSTOMY TAKEDOWN;  Surgeon:  Jules Husbands, MD;  Location: [facility];  Service: General;  Laterality: N/A;  . ESOPHAGOGASTRODUODENOSCOPY (EGD) WITH PROPOFOL N/A 04/04/2017   Procedure: ESOPHAGOGASTRODUODENOSCOPY (EGD) WITH PROPOFOL;  Surgeon: [name], MD;  Location: [facility];  Service: Endoscopy;  Laterality: N/A;  . FRACTURE SURGERY    . HIP FRACTURE SURGERY Left 01/25/2012  . JOINT REPLACEMENT    . LAPAROTOMY N/A 04/15/2017   Procedure: EXPLORATORY LAPAROTOMY;  Surgeon: Clayburn Pert, MD;  Location: [facility] ORS;  Service: General;   Laterality: N/A;  . NECK SURGERY  12/2011  . PARASTOMAL HERNIA REPAIR N/A 12/03/2017   Procedure: HERNIA REPAIR PARASTOMAL;  Surgeon: Jules Husbands, MD;  Location: [facility];  Service: General;  Laterality: N/A;  . PARASTOMAL HERNIA REPAIR N/A 06/25/2018   Procedure: HERNIA REPAIR PARASTOMAL;  Surgeon: Jules Husbands, MD;  Location: [facility];  Service: General;  Laterality: N/A;  . PORTACATH PLACEMENT Right 05/21/2017   Procedure: INSERTION PORT-A-CATH;  Surgeon: Clayburn Pert, MD;  Location: [facility];  Service: General;  Laterality: Right;  . WRIST FRACTURE SURGERY      Prior to Admission medications   Medication Sig Start Date End Date Taking? Authorizing Provider  budesonide-formoterol (SYMBICORT) 80-4.5 MCG/ACT inhaler Inhale 2 puffs into the lungs 2 (two) times daily. 06/09/18   Laverle Hobby, MD  cholecalciferol (VITAMIN D) 1000 units tablet Take 1,000 Units by mouth daily.    [provider]  feeding supplement (BOOST / RESOURCE BREEZE) LIQD Take 1 Container by mouth 3 (three) times daily between meals. 04/19/17   Fritzi Mandes, MD  gabapentin (NEURONTIN) 400 MG capsule TAKE 1 CAPSULE TWICE DAILY Patient taking differently: Take 400 mg by mouth 2 (two) times daily.  09/28/17   Birdie Sons, MD  HYDROcodone-acetaminophen (NORCO) 7.5-325 MG tablet Take 1-2 tablets by mouth every 6 (six) hours as needed for moderate pain. 07/22/18   Birdie Sons, MD  ibuprofen (ADVIL,MOTRIN) 200 MG tablet Take 400 mg by mouth daily as needed for headache or moderate pain.    [provider]  meloxicam (MOBIC) 15 MG tablet TAKE 1 TABLET EVERY DAY AS NEEDED Patient taking differently: Take 15 mg by mouth daily as needed for pain.  08/07/17   Birdie Sons, MD  montelukast (SINGULAIR) 10 MG tablet Take 1 tablet (10 mg total) by mouth daily. 06/09/18   Laverle Hobby, MD  omeprazole (PRILOSEC) 20 MG capsule TAKE 1 CAPSULE EVERY DAY Patient taking differently: Take 20 mg by  mouth daily.  02/10/18   Birdie Sons, MD  pravastatin (PRAVACHOL) 40 MG tablet TAKE 1 TABLET EVERY DAY Patient taking differently: Take 40 mg by mouth daily.  04/27/18   Birdie Sons, MD  Probiotic Product (PROBIOTIC DAILY PO) Take 1 capsule by mouth daily.     [provider]  pyridOXINE (VITAMIN B-6) 50 MG tablet Take 1 tablet (50 mg total) by mouth daily. 06/24/17   Earlie Server, MD  Triamcinolone Acetonide (NASACORT ALLERGY 24HR NA) Place 2 sprays into the nose daily.     [provider]  vitamin B-12 (CYANOCOBALAMIN) 1000 MCG tablet Take 1,000 mcg by mouth daily.    [provider]    Allergies Betadine [povidone iodine]; Other; and Sinus formula [cholestatin]  Family History  Problem Relation Age of Onset  . Diabetes Mother        type 2  . Coronary artery disease Mother   . Deep vein thrombosis Mother   . Colon cancer Father   .  Asthma Brother   . Diabetes Brother        type 2    Social History Social History   Tobacco Use  . Smoking status: Former Smoker    Packs/day: 0.25    Years: 55.00    Pack years: 13.75    Types: Cigarettes    Last attempt to quit: 05/21/2017    Years since quitting: 1.1  . Smokeless tobacco: Never Used  . Tobacco comment: started age 7 1/2 to 1 ppd  Substance Use Topics  . Alcohol use: Yes    Alcohol/week: 0.0 standard drinks    Comment: occasionally drinks beer  . Drug use: No    Review of Systems  Constitutional: No fever/chills Eyes: No visual changes. ENT: No sore throat. Cardiovascular: Denies chest pain. Respiratory: Denies shortness of breath. Gastrointestinal: abdominal pain.  No nausea, no vomiting.  diarrhea.  No constipation. Genitourinary: Negative for dysuria. Musculoskeletal: Negative for back pain. Skin: Negative for rash. Neurological: Negative for headaches, focal weakness  ____________________________________________   PHYSICAL EXAM:  VITAL SIGNS: ED Triage Vitals [07/30/18  1903]  Enc Vitals Group     BP 107/68     Pulse Rate (!) 107     Resp 16     Temp 98.2 F (36.8 C)     Temp Source Oral     SpO2 94 %     Weight 162 lb (73.5 kg)     Height 5\' 4"  (1.626 m)     Head Circumference      Peak Flow      Pain Score 10     Pain Loc      Pain Edu?      Excl. in [place]?     Constitutional: Alert and oriented. Ill appearing  Eyes: Conjunctivae are normal.  Head: Atraumatic. Nose: No congestion/rhinnorhea. Mouth/Throat: Mucous membranes are moist.  Oropharynx non-erythematous. Neck: No stridor.   Cardiovascular: Normal rate, regular rhythm. Grossly normal heart sounds.  Good peripheral circulation. Respiratory: Normal respiratory effort.  No retractions. Lungs CTAB. Gastrointestinal: Soft diffusely tender. No distention. No abdominal bruits. No CVA tenderness. Musculoskeletal: No lower extremity tenderness nor edema.  No joint effusions. Neurologic:  Normal speech and language. No gross focal neurologic deficits are appreciated. No gait instability. Skin:  Skin is warm, dry and intact. No rash noted. Psychiatric: Mood and affect are normal. Speech and behavior are normal.  ____________________________________________   LABS (all labs ordered are listed, but only abnormal results are displayed)  Labs Reviewed  C DIFFICILE QUICK SCREEN W PCR REFLEX - Abnormal; Notable for the following components:      Result Value   C Diff antigen POSITIVE (*)    C Diff toxin POSITIVE (*)    All other components within normal limits  COMPREHENSIVE METABOLIC PANEL - Abnormal; Notable for the following components:   Sodium 130 (*)    Chloride 95 (*)    Glucose, Bld 168 (*)    Calcium 8.5 (*)    Albumin 3.1 (*)    All other components within normal limits  CBC - Abnormal; Notable for the following components:   WBC 27.0 (*)    Platelets 506 (*)    All other components within normal limits  GASTROINTESTINAL PANEL BY PCR, STOOL (REPLACES STOOL CULTURE)  LIPASE,  BLOOD  URINALYSIS, COMPLETE (UACMP) WITH MICROSCOPIC   ____________________________________________  EKG   ____________________________________________  RADIOLOGY  ED MD interpretation: CT consistent with colitis.  Official radiology report(s): Ct Abdomen Pelvis  W Contrast  Result Date: 07/30/2018 CLINICAL DATA:  Generalized abdominal pain with diarrhea and vomiting today. Finished antibiotics last week and had: Reconstruction surgery last week. History of colon cancer. EXAM: CT ABDOMEN AND PELVIS WITH CONTRAST TECHNIQUE: Multidetector CT imaging of the abdomen and pelvis was performed using the standard protocol following bolus administration of intravenous contrast. CONTRAST:  158mL ISOVUE-300 IOPAMIDOL (ISOVUE-300) INJECTION 61% COMPARISON:  07/06/2018 FINDINGS: Lower chest: Mild bibasilar atelectasis improved. Hepatobiliary: Liver, gallbladder and biliary tree are normal. Pancreas: Normal. Spleen: Normal. Adrenals/Urinary Tract: 2.6 cm right adrenal mass and 5.7 cm left adrenal mass unchanged. Kidneys are normal. Ureters and bladder are normal. Stomach/Bowel: Stomach is normal. Small bowel demonstrates a few minimally dilated loops over the right mid to upper abdomen measuring up to 3.6 cm in diameter slightly improved. Evidence of patient's previous partial colectomy within asked most cysts over the right mid abdomen unchanged and patent. There is decompression wall thickening of the colon involving the descending and rectosigmoid colon likely an acute colitis of infectious or inflammatory nature. Vascular/Lymphatic: Calcified plaque over the abdominal aorta. No significant adenopathy. Reproductive: Unremarkable. Other: Postsurgical changes over the abdominal wall most prominent over the right lower abdominal wall. Postsurgical stranding of the mesentery over the right lower quadrant/upper pelvis unchanged. No significant free fluid or free peritoneal air. Musculoskeletal: Unchanged including  mild compression deformity of T12. IMPRESSION: Wall thickening involving the descending and rectosigmoid colon likely due to an acute colitis of infectious or inflammatory nature. Few persistent mildly dilated small bowel loops over the right mid to upper abdomen with interval improvement. Postsurgical change compatible previous partial colectomy. Stable bilateral adrenal masses. Aortic Atherosclerosis (ICD10-I70.0). Stable mild compression deformity of T12 Electronically Signed   By: Marin Olp M.D.   On: 07/30/2018 23:36    ____________________________________________   PROCEDURES  Procedure(s) performed:   Procedures  Critical Care performed:   ____________________________________________   INITIAL IMPRESSION / ASSESSMENT AND PLAN / ED COURSE  Patient was very high white blood count and C. difficile colitis.  We will plan on admitting her.  Clinical Course as of Jul 31 2339  Thu Jul 30, 2018  2308 Chloride(!): 95 [PM]    Clinical Course User Index [PM] Nena Polio, MD     ____________________________________________   FINAL CLINICAL IMPRESSION(S) / ED DIAGNOSES  Final diagnoses:  C. difficile colitis  Generalized abdominal pain     ED Discharge Orders    None       Note:  This document was prepared using Dragon voice recognition software and may include unintentional dictation errors.    Nena Polio, MD 07/30/18 [phone]

## 2018-07-30 NOTE — Telephone Encounter (Signed)
Order written, please fax. Thanks.   Message  Received: Today  Message Contents  Maryjean Morn, MD        You placed an order for overnight oximetry.I need an order to fax to [facility].My fax # is [phone]

## 2018-07-30 NOTE — Telephone Encounter (Signed)
Patient called today to the answering center referring that has been with nausea, vomiting and diarrhea. Diarrhea described as watery. Denies significant abdominal pain or fever. Patient refers was recently on antibiotic due to wound infection.   I recommended the patient to go to ED for evaluation. Her surgery was a colostomy takedown on 06/25/18. She has been seen in the office on 07/20/18 by Dr. Dahlia Byes and everything was doing well at that time. Being so far from surgery does not seems to be a surgical complication but further evaluation needs to be done. At least patient was recommended to come for hydration since she has not been tolerating fluids at home.

## 2018-07-30 NOTE — ED Notes (Signed)
PT able to have BM at this time. Verbal order for stool sample and testing obtained from Dr. Cinda Quest

## 2018-07-31 ENCOUNTER — Telehealth: Payer: Self-pay | Admitting: Family Medicine

## 2018-07-31 DIAGNOSIS — M810 Age-related osteoporosis without current pathological fracture: Secondary | ICD-10-CM | POA: Diagnosis present

## 2018-07-31 DIAGNOSIS — Z9049 Acquired absence of other specified parts of digestive tract: Secondary | ICD-10-CM | POA: Diagnosis not present

## 2018-07-31 DIAGNOSIS — Z791 Long term (current) use of non-steroidal anti-inflammatories (NSAID): Secondary | ICD-10-CM | POA: Diagnosis not present

## 2018-07-31 DIAGNOSIS — Z7951 Long term (current) use of inhaled steroids: Secondary | ICD-10-CM | POA: Diagnosis not present

## 2018-07-31 DIAGNOSIS — Z86010 Personal history of colonic polyps: Secondary | ICD-10-CM | POA: Diagnosis not present

## 2018-07-31 DIAGNOSIS — Z825 Family history of asthma and other chronic lower respiratory diseases: Secondary | ICD-10-CM | POA: Diagnosis not present

## 2018-07-31 DIAGNOSIS — A0472 Enterocolitis due to Clostridium difficile, not specified as recurrent: Secondary | ICD-10-CM | POA: Diagnosis present

## 2018-07-31 DIAGNOSIS — Z8249 Family history of ischemic heart disease and other diseases of the circulatory system: Secondary | ICD-10-CM | POA: Diagnosis not present

## 2018-07-31 DIAGNOSIS — Z833 Family history of diabetes mellitus: Secondary | ICD-10-CM | POA: Diagnosis not present

## 2018-07-31 DIAGNOSIS — Z853 Personal history of malignant neoplasm of breast: Secondary | ICD-10-CM | POA: Diagnosis not present

## 2018-07-31 DIAGNOSIS — Z87891 Personal history of nicotine dependence: Secondary | ICD-10-CM | POA: Diagnosis not present

## 2018-07-31 DIAGNOSIS — R1084 Generalized abdominal pain: Secondary | ICD-10-CM | POA: Diagnosis present

## 2018-07-31 DIAGNOSIS — M5136 Other intervertebral disc degeneration, lumbar region: Secondary | ICD-10-CM | POA: Diagnosis present

## 2018-07-31 DIAGNOSIS — E785 Hyperlipidemia, unspecified: Secondary | ICD-10-CM | POA: Diagnosis present

## 2018-07-31 DIAGNOSIS — J449 Chronic obstructive pulmonary disease, unspecified: Secondary | ICD-10-CM | POA: Diagnosis present

## 2018-07-31 DIAGNOSIS — Z923 Personal history of irradiation: Secondary | ICD-10-CM | POA: Diagnosis not present

## 2018-07-31 DIAGNOSIS — I2510 Atherosclerotic heart disease of native coronary artery without angina pectoris: Secondary | ICD-10-CM | POA: Diagnosis present

## 2018-07-31 DIAGNOSIS — Z888 Allergy status to other drugs, medicaments and biological substances status: Secondary | ICD-10-CM | POA: Diagnosis not present

## 2018-07-31 DIAGNOSIS — Z800 Family history of malignant neoplasm of digestive organs: Secondary | ICD-10-CM | POA: Diagnosis not present

## 2018-07-31 DIAGNOSIS — E559 Vitamin D deficiency, unspecified: Secondary | ICD-10-CM | POA: Diagnosis present

## 2018-07-31 DIAGNOSIS — Z79899 Other long term (current) drug therapy: Secondary | ICD-10-CM | POA: Diagnosis not present

## 2018-07-31 DIAGNOSIS — G629 Polyneuropathy, unspecified: Secondary | ICD-10-CM | POA: Diagnosis present

## 2018-07-31 DIAGNOSIS — Z85038 Personal history of other malignant neoplasm of large intestine: Secondary | ICD-10-CM | POA: Diagnosis not present

## 2018-07-31 DIAGNOSIS — K219 Gastro-esophageal reflux disease without esophagitis: Secondary | ICD-10-CM | POA: Diagnosis present

## 2018-07-31 LAB — HEMOGLOBIN A1C
Hgb A1c MFr Bld: 4.9 % (ref 4.8–5.6)
Mean Plasma Glucose: 93.93 mg/dL

## 2018-07-31 LAB — TSH: TSH: 1.225 u[IU]/mL (ref 0.350–4.500)

## 2018-07-31 MED ORDER — SACCHAROMYCES BOULARDII 250 MG PO CAPS
250.0000 mg | ORAL_CAPSULE | Freq: Two times a day (BID) | ORAL | Status: DC
  Administered 2018-07-31 – 2018-08-01 (×3): 250 mg via ORAL
  Filled 2018-07-31 (×4): qty 1

## 2018-07-31 MED ORDER — VITAMIN D 1000 UNITS PO TABS
1000.0000 [IU] | ORAL_TABLET | Freq: Every day | ORAL | Status: DC
  Administered 2018-07-31 – 2018-08-01 (×2): 1000 [IU] via ORAL
  Filled 2018-07-31 (×2): qty 1

## 2018-07-31 MED ORDER — VITAMIN B-6 50 MG PO TABS
50.0000 mg | ORAL_TABLET | Freq: Every day | ORAL | Status: DC
  Administered 2018-07-31 – 2018-08-01 (×2): 50 mg via ORAL
  Filled 2018-07-31 (×2): qty 1

## 2018-07-31 MED ORDER — DOCUSATE SODIUM 100 MG PO CAPS
100.0000 mg | ORAL_CAPSULE | Freq: Two times a day (BID) | ORAL | Status: DC
  Administered 2018-07-31 (×2): 100 mg via ORAL
  Filled 2018-07-31 (×2): qty 1

## 2018-07-31 MED ORDER — FLUTICASONE FUROATE-VILANTEROL 100-25 MCG/INH IN AEPB
1.0000 | INHALATION_SPRAY | Freq: Every day | RESPIRATORY_TRACT | Status: DC
  Administered 2018-07-31 – 2018-08-01 (×2): 1 via RESPIRATORY_TRACT
  Filled 2018-07-31: qty 28

## 2018-07-31 MED ORDER — ENSURE ENLIVE PO LIQD
237.0000 mL | Freq: Three times a day (TID) | ORAL | Status: DC
  Administered 2018-07-31 (×2): 237 mL via ORAL

## 2018-07-31 MED ORDER — ENOXAPARIN SODIUM 40 MG/0.4ML ~~LOC~~ SOLN
40.0000 mg | SUBCUTANEOUS | Status: DC
  Administered 2018-07-31: 40 mg via SUBCUTANEOUS
  Filled 2018-07-31: qty 0.4

## 2018-07-31 MED ORDER — ONDANSETRON HCL 4 MG/2ML IJ SOLN
4.0000 mg | Freq: Four times a day (QID) | INTRAMUSCULAR | Status: DC | PRN
  Administered 2018-07-31 (×2): 4 mg via INTRAVENOUS
  Filled 2018-07-31 (×2): qty 2

## 2018-07-31 MED ORDER — ONDANSETRON HCL 4 MG PO TABS: 4.0000 mg | ORAL_TABLET | Freq: Four times a day (QID) | ORAL | Status: DC | PRN

## 2018-07-31 MED ORDER — HYDROCODONE-ACETAMINOPHEN 7.5-325 MG PO TABS
1.0000 | ORAL_TABLET | Freq: Four times a day (QID) | ORAL | Status: DC | PRN
  Administered 2018-07-31 (×3): 2 via ORAL
  Filled 2018-07-31 (×3): qty 2

## 2018-07-31 MED ORDER — PRAVASTATIN SODIUM 20 MG PO TABS
40.0000 mg | ORAL_TABLET | Freq: Every day | ORAL | Status: DC
  Administered 2018-07-31 – 2018-08-01 (×2): 40 mg via ORAL
  Filled 2018-07-31 (×2): qty 2

## 2018-07-31 MED ORDER — VANCOMYCIN HCL 125 MG PO CAPS: 125.0000 mg | ORAL_CAPSULE | Freq: Four times a day (QID) | ORAL | 0 refills | Status: AC

## 2018-07-31 MED ORDER — MONTELUKAST SODIUM 10 MG PO TABS
10.0000 mg | ORAL_TABLET | Freq: Every day | ORAL | Status: DC
  Administered 2018-07-31 – 2018-08-01 (×2): 10 mg via ORAL
  Filled 2018-07-31 (×2): qty 1

## 2018-07-31 MED ORDER — TRIAMCINOLONE ACETONIDE 55 MCG/ACT NA AERO
2.0000 | INHALATION_SPRAY | Freq: Every day | NASAL | Status: DC
  Administered 2018-07-31 – 2018-08-01 (×2): 2 via NASAL
  Filled 2018-07-31: qty 21.6

## 2018-07-31 MED ORDER — SODIUM CHLORIDE 0.9 % IV SOLN
INTRAVENOUS | Status: DC
  Administered 2018-07-31 – 2018-08-01 (×3): via INTRAVENOUS

## 2018-07-31 MED ORDER — ACETAMINOPHEN 325 MG PO TABS: 650.0000 mg | ORAL_TABLET | Freq: Four times a day (QID) | ORAL | Status: DC | PRN

## 2018-07-31 MED ORDER — GABAPENTIN 400 MG PO CAPS
400.0000 mg | ORAL_CAPSULE | Freq: Two times a day (BID) | ORAL | Status: DC
  Administered 2018-07-31 – 2018-08-01 (×3): 400 mg via ORAL
  Filled 2018-07-31 (×3): qty 1

## 2018-07-31 MED ORDER — VITAMIN B-12 1000 MCG PO TABS
1000.0000 ug | ORAL_TABLET | Freq: Every day | ORAL | Status: DC
  Administered 2018-07-31 – 2018-08-01 (×2): 1000 ug via ORAL
  Filled 2018-07-31 (×2): qty 1

## 2018-07-31 MED ORDER — ACETAMINOPHEN 650 MG RE SUPP: 650.0000 mg | Freq: Four times a day (QID) | RECTAL | Status: DC | PRN

## 2018-07-31 NOTE — H&P (Addendum)
Kimberly [name] is an 72 y.o. female.   Chief Complaint: Abdominal pain HPI: The patient with past medical history of colon cancer status post colostomy with colectomy takedown, breast cancer status post lumpectomy and radiation and upper lipidemia presents to the emergency department with abdominal pain and diarrhea.  The patient states that she has been having nonbloody copious bowel movements for at least one day but her cramping started 2 days ago.  The colectomy was recently taken down but the patient has not been well since.  Laboratory evaluation was significant for leukocytosis as well as positive C. difficile toxin which prompted the emergency department staff to call the hospitalist service for admission.  Past Medical History:  Diagnosis Date  . Breast cancer, left (HCC)    Lumpectomy and rad tx's.  . Chronic back pain   . Colon cancer ([place])   . COPD (chronic obstructive pulmonary disease) ([place])   . Degenerative disc disease, lumbar 04/2013  . Dyspnea   . Family history of adverse reaction to anesthesia    son arrested after anesthesia  . GERD (gastroesophageal reflux disease)   . Hyperlipidemia   . Iron deficiency anemia 05/27/2017  . Neuropathy   . Osteoarthritis    of knee  . Osteoporosis   . Small bowel obstruction ([place]) 04/16/2017  . Tobacco abuse   . Vitamin D deficiency     Past Surgical History:  Procedure Laterality Date  . APPENDECTOMY  1965  . BREAST EXCISIONAL BIOPSY Left 08/01/2003   lumpectomy rad 11/04-2/28/2005  . BREAST SURGERY    . CESAREAN SECTION     x3  . COLON SURGERY    . COLONOSCOPY W/ POLYPECTOMY    . COLONOSCOPY WITH PROPOFOL N/A 04/09/2018   Procedure: COLONOSCOPY WITH PROPOFOL;  Surgeon: Virgel Manifold, MD;  Location: [facility];  Service: Gastroenterology;  Laterality: N/A;  . COLOSTOMY TAKEDOWN N/A 06/25/2018   Procedure: COLOSTOMY TAKEDOWN;  Surgeon: Jules Husbands, MD;  Location: [facility] ORS;  Service: General;  Laterality: N/A;  .  ESOPHAGOGASTRODUODENOSCOPY (EGD) WITH PROPOFOL N/A 04/04/2017   Procedure: ESOPHAGOGASTRODUODENOSCOPY (EGD) WITH PROPOFOL;  Surgeon: [name], MD;  Location: [facility];  Service: Endoscopy;  Laterality: N/A;  . FRACTURE SURGERY    . HIP FRACTURE SURGERY Left 01/25/2012  . JOINT REPLACEMENT    . LAPAROTOMY N/A 04/15/2017   Procedure: EXPLORATORY LAPAROTOMY;  Surgeon: Clayburn Pert, MD;  Location: [facility] ORS;  Service: General;  Laterality: N/A;  . NECK SURGERY  12/2011  . PARASTOMAL HERNIA REPAIR N/A 12/03/2017   Procedure: HERNIA REPAIR PARASTOMAL;  Surgeon: Jules Husbands, MD;  Location: [facility];  Service: General;  Laterality: N/A;  . PARASTOMAL HERNIA REPAIR N/A 06/25/2018   Procedure: HERNIA REPAIR PARASTOMAL;  Surgeon: Jules Husbands, MD;  Location: [facility];  Service: General;  Laterality: N/A;  . PORTACATH PLACEMENT Right 05/21/2017   Procedure: INSERTION PORT-A-CATH;  Surgeon: Clayburn Pert, MD;  Location: [facility];  Service: General;  Laterality: Right;  . WRIST FRACTURE SURGERY      Family History  Problem Relation Age of Onset  . Diabetes Mother        type 2  . Coronary artery disease Mother   . Deep vein thrombosis Mother   . Colon cancer Father   . Asthma Brother   . Diabetes Brother        type 2   Social History:  reports that she quit smoking about 14 months ago. Her smoking use included  cigarettes. She has a 13.75 pack-year smoking history. She has never used smokeless tobacco. She reports that she drinks alcohol. She reports that she does not use drugs.  Allergies:  Allergies  Allergen Reactions  . Betadine [Povidone Iodine] Other (See Comments)    Topical iodine she states "if you put it in an open sore it will cause a eating ulcer."  . Other Nausea And Vomiting    Antihystamines  . Sinus Formula [Cholestatin] Nausea Only    Medications Prior to Admission  Medication Sig Dispense Refill  . budesonide-formoterol (SYMBICORT) 80-4.5 MCG/ACT  inhaler Inhale 2 puffs into the lungs 2 (two) times daily. 3 Inhaler 3  . cholecalciferol (VITAMIN D) 1000 units tablet Take 1,000 Units by mouth daily.    . feeding supplement (BOOST / RESOURCE BREEZE) LIQD Take 1 Container by mouth 3 (three) times daily between meals. 30 Container 0  . gabapentin (NEURONTIN) 400 MG capsule TAKE 1 CAPSULE TWICE DAILY (Patient taking differently: Take 400 mg by mouth 2 (two) times daily. ) 180 capsule 4  . HYDROcodone-acetaminophen (NORCO) 7.5-325 MG tablet Take 1-2 tablets by mouth every 6 (six) hours as needed for moderate pain. 120 tablet 0  . ibuprofen (ADVIL,MOTRIN) 200 MG tablet Take 400 mg by mouth daily as needed for headache or moderate pain.    . meloxicam (MOBIC) 15 MG tablet TAKE 1 TABLET EVERY DAY AS NEEDED (Patient taking differently: Take 15 mg by mouth daily as needed for pain. ) 90 tablet 4  . montelukast (SINGULAIR) 10 MG tablet Take 1 tablet (10 mg total) by mouth daily. 90 tablet 1  . omeprazole (PRILOSEC) 20 MG capsule TAKE 1 CAPSULE EVERY DAY (Patient taking differently: Take 20 mg by mouth daily. ) 90 capsule 4  . pravastatin (PRAVACHOL) 40 MG tablet TAKE 1 TABLET EVERY DAY (Patient taking differently: Take 40 mg by mouth daily. ) 90 tablet 3  . Probiotic Product (PROBIOTIC DAILY PO) Take 1 capsule by mouth daily.     Marland Kitchen pyridOXINE (VITAMIN B-6) 50 MG tablet Take 1 tablet (50 mg total) by mouth daily. 30 tablet 3  . Triamcinolone Acetonide (NASACORT ALLERGY 24HR NA) Place 2 sprays into the nose daily.     . vitamin B-12 (CYANOCOBALAMIN) 1000 MCG tablet Take 1,000 mcg by mouth daily.      Results for orders placed or performed during the hospital encounter of 07/30/18 (from the past 48 hour(s))  Lipase, blood     Status: None   Collection Time: 07/30/18  7:22 PM  Result Value Ref Range   Lipase 20 11 - 51 U/L    Comment: Performed at [facility], [address] [place], [place] [address]  Comprehensive metabolic panel      Status: Abnormal   Collection Time: 07/30/18  7:22 PM  Result Value Ref Range   Sodium 130 (L) 135 - 145 mmol/L   Potassium 3.9 3.5 - 5.1 mmol/L   Chloride 95 (L) 98 - 111 mmol/L   CO2 24 22 - 32 mmol/L   Glucose, Bld 168 (H) 70 - 99 mg/dL   BUN 20 8 - 23 mg/dL   Creatinine, Ser 0.70 0.44 - 1.00 mg/dL   Calcium 8.5 (L) 8.9 - 10.3 mg/dL   Total Protein 7.5 6.5 - 8.1 g/dL   Albumin 3.1 (L) 3.5 - 5.0 g/dL   AST 20 15 - 41 U/L   ALT 11 0 - 44 U/L   Alkaline Phosphatase 82 38 - 126 U/L  Total Bilirubin 0.6 0.3 - 1.2 mg/dL   GFR calc non Af Amer >60 >60 mL/min   GFR calc Af Amer >60 >60 mL/min    Comment: (NOTE) The eGFR has been calculated using the CKD EPI equation. This calculation has not been validated in all clinical situations. eGFR's persistently <60 mL/min signify possible Chronic Kidney Disease.    Anion gap 11 5 - 15    Comment: Performed at [facility], [address] [place], [place] [address]  CBC     Status: Abnormal   Collection Time: 07/30/18  7:22 PM  Result Value Ref Range   WBC 27.0 (H) 4.0 - 10.5 K/uL   RBC 4.28 3.87 - 5.11 MIL/uL   Hemoglobin 12.8 12.0 - 15.0 g/dL   HCT 38.5 36.0 - 46.0 %   MCV 90.0 80.0 - 100.0 fL   MCH 29.9 26.0 - 34.0 pg   MCHC 33.2 30.0 - 36.0 g/dL   RDW 14.6 11.5 - 15.5 %   Platelets 506 (H) 150 - 400 K/uL   nRBC 0.0 0.0 - 0.2 %    Comment: Performed at [facility], [address] [place], [place] [address]  Urinalysis, Complete w Microscopic     Status: Abnormal   Collection Time: 07/30/18  7:22 PM  Result Value Ref Range   Color, Urine YELLOW (A) YELLOW   APPearance CLEAR (A) CLEAR   Specific Gravity, Urine 1.004 (L) 1.005 - 1.030   pH 6.0 5.0 - 8.0   Glucose, UA NEGATIVE NEGATIVE mg/dL   Hgb urine dipstick NEGATIVE NEGATIVE   Bilirubin Urine NEGATIVE NEGATIVE   Ketones, ur NEGATIVE NEGATIVE mg/dL   Protein, ur NEGATIVE NEGATIVE mg/dL   Nitrite NEGATIVE NEGATIVE   Leukocytes, UA NEGATIVE NEGATIVE    RBC / HPF 0-5 0 - 5 RBC/hpf   WBC, UA 0-5 0 - 5 WBC/hpf   Bacteria, UA NONE SEEN NONE SEEN   Squamous Epithelial / LPF 0-5 0 - 5    Comment: Performed at [facility], [address] [place], [place] [address]  TSH     Status: None   Collection Time: 07/30/18  7:22 PM  Result Value Ref Range   TSH 1.225 0.350 - 4.500 uIU/mL    Comment: Performed by a 3rd Generation assay with a functional sensitivity of <=0.01 uIU/mL. Performed at [facility], [address] [place], [place] [address]   C difficile quick scan w PCR reflex     Status: Abnormal   Collection Time: 07/30/18  7:33 PM  Result Value Ref Range   C Diff antigen POSITIVE (A) NEGATIVE   C Diff toxin POSITIVE (A) NEGATIVE   C Diff interpretation Toxin producing C. difficile detected.     Comment: CRITICAL RESULT CALLED TO, READ BACK BY AND VERIFIED WITH: [place] LOYE 07/30/18 AT 2037 BY HS Performed at [facility], [address] [place], [place] [address]   Gastrointestinal Panel by PCR , Stool     Status: None   Collection Time: 07/30/18  7:33 PM  Result Value Ref Range   Campylobacter species NOT DETECTED NOT DETECTED   Plesimonas shigelloides NOT DETECTED NOT DETECTED   Salmonella species NOT DETECTED NOT DETECTED   Yersinia enterocolitica NOT DETECTED NOT DETECTED   Vibrio species NOT DETECTED NOT DETECTED   Vibrio cholerae NOT DETECTED NOT DETECTED   Enteroaggregative E coli (EAEC) NOT DETECTED NOT DETECTED   Enteropathogenic E coli (EPEC) NOT DETECTED NOT DETECTED  Enterotoxigenic E coli (ETEC) NOT DETECTED NOT DETECTED   Shiga like toxin producing E coli (STEC) NOT DETECTED NOT DETECTED   Shigella/Enteroinvasive E coli (EIEC) NOT DETECTED NOT DETECTED   Cryptosporidium NOT DETECTED NOT DETECTED   Cyclospora cayetanensis NOT DETECTED NOT DETECTED   Entamoeba histolytica NOT DETECTED NOT DETECTED   Giardia lamblia NOT DETECTED NOT DETECTED   Adenovirus F40/41 NOT DETECTED NOT  DETECTED   Astrovirus NOT DETECTED NOT DETECTED   Norovirus GI/GII NOT DETECTED NOT DETECTED   Rotavirus A NOT DETECTED NOT DETECTED   Sapovirus (I, II, IV, and V) NOT DETECTED NOT DETECTED    Comment: Performed at [facility], [address] [place], [place] [address]   Ct Abdomen Pelvis W Contrast  Result Date: 07/30/2018 CLINICAL DATA:  Generalized abdominal pain with diarrhea and vomiting today. Finished antibiotics last week and had: Reconstruction surgery last week. History of colon cancer. EXAM: CT ABDOMEN AND PELVIS WITH CONTRAST TECHNIQUE: Multidetector CT imaging of the abdomen and pelvis was performed using the standard protocol following bolus administration of intravenous contrast. CONTRAST:  162m ISOVUE-300 IOPAMIDOL (ISOVUE-300) INJECTION 61% COMPARISON:  07/06/2018 FINDINGS: Lower chest: Mild bibasilar atelectasis improved. Hepatobiliary: Liver, gallbladder and biliary tree are normal. Pancreas: Normal. Spleen: Normal. Adrenals/Urinary Tract: 2.6 cm right adrenal mass and 5.7 cm left adrenal mass unchanged. Kidneys are normal. Ureters and bladder are normal. Stomach/Bowel: Stomach is normal. Small bowel demonstrates a few minimally dilated loops over the right mid to upper abdomen measuring up to 3.6 cm in diameter slightly improved. Evidence of patient's previous partial colectomy within asked most cysts over the right mid abdomen unchanged and patent. There is decompression wall thickening of the colon involving the descending and rectosigmoid colon likely an acute colitis of infectious or inflammatory nature. Vascular/Lymphatic: Calcified plaque over the abdominal aorta. No significant adenopathy. Reproductive: Unremarkable. Other: Postsurgical changes over the abdominal wall most prominent over the right lower abdominal wall. Postsurgical stranding of the mesentery over the right lower quadrant/upper pelvis unchanged. No significant free fluid or free peritoneal air.  Musculoskeletal: Unchanged including mild compression deformity of T12. IMPRESSION: Wall thickening involving the descending and rectosigmoid colon likely due to an acute colitis of infectious or inflammatory nature. Few persistent mildly dilated small bowel loops over the right mid to upper abdomen with interval improvement. Postsurgical change compatible previous partial colectomy. Stable bilateral adrenal masses. Aortic Atherosclerosis (ICD10-I70.0). Stable mild compression deformity of T12 Electronically Signed   By: DMarin OlpM.D.   On: 07/30/2018 23:36    Review of Systems  Constitutional: Negative for chills and fever.  HENT: Negative for sore throat and tinnitus.   Eyes: Negative for blurred vision and redness.  Respiratory: Negative for cough and shortness of breath.   Cardiovascular: Negative for chest pain, palpitations, orthopnea and PND.  Gastrointestinal: Positive for abdominal pain and diarrhea. Negative for nausea and vomiting.  Genitourinary: Negative for dysuria, frequency and urgency.  Musculoskeletal: Negative for joint pain and myalgias.  Skin: Negative for rash.       No lesions  Neurological: Negative for speech change, focal weakness and weakness.  Endo/Heme/Allergies: Does not bruise/bleed easily.       No temperature intolerance  Psychiatric/Behavioral: Negative for depression and suicidal ideas.    Blood pressure 138/67, pulse 96, temperature 98.6 F (37 C), temperature source Oral, resp. rate 20, height '5\' 4"'  (1.626 m), weight 73.5 kg, SpO2 93 %. Physical Exam  Vitals reviewed. Constitutional: She is oriented to person, place,  and time. She appears well-developed and well-nourished. No distress.  HENT:  Head: Normocephalic and atraumatic.  Mouth/Throat: Oropharynx is clear and moist.  Eyes: Pupils are equal, round, and reactive to light. Conjunctivae and EOM are normal. No scleral icterus.  Neck: Normal range of motion. Neck supple. No JVD present. No  tracheal deviation present. No thyromegaly present.  Cardiovascular: Normal rate, regular rhythm and normal heart sounds. Exam reveals no gallop and no friction rub.  No murmur heard. Respiratory: Effort normal and breath sounds normal.  GI: Soft. Bowel sounds are normal. She exhibits no distension and no mass. There is tenderness. There is no rebound and no guarding.  Genitourinary:  Genitourinary Comments: Deferred  Musculoskeletal: Normal range of motion. She exhibits no edema.  Lymphadenopathy:    She has no cervical adenopathy.  Neurological: She is alert and oriented to person, place, and time. No cranial nerve deficit. She exhibits normal muscle tone.  Skin: Skin is warm and dry. No rash noted. No erythema.  Psychiatric: She has a normal mood and affect. Her behavior is normal. Judgment and thought content normal.     Assessment/Plan This is a 72 year old female admitted for C. difficile colitis. 1.  Colitis: Secondary to clostridium difficile.  Continue oral vancomycin as well as probiotic therapy. 2.  COPD: Continue inhaled corticosteroid.  Albuterol as needed. 3.  Hyperlipidemia: Continue statin therapy 4.  DVT prophylaxis: Lovenox 5.  GI prophylaxis: None The patient is a full code.  Time spent on admission orders and patient care approximately 45 minutes  Harrie Foreman, MD 07/31/2018, 7:31 AM

## 2018-07-31 NOTE — Telephone Encounter (Signed)
Order was faxed on 07/31/2018.,PC

## 2018-07-31 NOTE — Care Management (Signed)
Patient admitted from home with cdiff.  Patient lives at home alone.  Daughter lives locally for support.  PCP [facility]. Pharmacy [facility].  Previous admission patient received home O2, and RW.  Patient open with [facility] home [facility] for RN.  Joelene Millin with [facility] notified of admission.  Anticipate patient will discharge on oral Vanc.  Pharmacy has checked Vanc capsules benefit.  RNCM will need to confirm with pharmacy prior to discharge that they have capsules in stock.

## 2018-07-31 NOTE — Telephone Encounter (Signed)
Order for overnight oximetry faxed to [facility] °

## 2018-07-31 NOTE — Progress Notes (Signed)
[facility] [place] NAME: Dillan Lunden    MR#:  [id number]  DATE OF BIRTH:  08/15/1946  SUBJECTIVE:  patient had three runny stool's this morning. She is having some abdominal pain. She is trying to eat Jell-O and liquid diet. She wants to try more of soft food.  REVIEW OF SYSTEMS:   Review of Systems  Constitutional: Negative for chills, fever and weight loss.  HENT: Negative for ear discharge, ear pain and nosebleeds.   Eyes: Negative for blurred vision, pain and discharge.  Respiratory: Negative for sputum production, shortness of breath, wheezing and stridor.   Cardiovascular: Negative for chest pain, palpitations, orthopnea and PND.  Gastrointestinal: Positive for abdominal pain and diarrhea. Negative for nausea and vomiting.  Genitourinary: Negative for frequency and urgency.  Musculoskeletal: Negative for back pain and joint pain.  Neurological: Negative for sensory change, speech change, focal weakness and weakness.  Psychiatric/Behavioral: Negative for depression and hallucinations. The patient is not nervous/anxious.    Tolerating Diet:yes Tolerating PT: ambulatory  DRUG ALLERGIES:   Allergies  Allergen Reactions  . Betadine [Povidone Iodine] Other (See Comments)    Topical iodine she states "if you put it in an open sore it will cause a eating ulcer."  . Other Nausea And Vomiting    Antihystamines  . Sinus Formula [Cholestatin] Nausea Only    VITALS:  Blood pressure 128/64, pulse 92, temperature 97.7 F (36.5 C), temperature source Oral, resp. rate 20, height 5\' 4"  (1.626 m), weight 73.5 kg, SpO2 95 %.  PHYSICAL EXAMINATION:   Physical Exam  GENERAL:  72 y.o.-year-old patient lying in the bed with no acute distress.  EYES: Pupils equal, round, reactive to light and accommodation. No scleral icterus. Extraocular muscles intact.  HEENT: Head atraumatic, normocephalic. Oropharynx and nasopharynx clear.  NECK:   Supple, no jugular venous distention. No thyroid enlargement, no tenderness.  LUNGS: Normal breath sounds bilaterally, no wheezing, rales, rhonchi. No use of accessory muscles of respiration.  CARDIOVASCULAR: S1, S2 normal. No murmurs, rubs, or gallops.  ABDOMEN: Soft, diffuse tender, nondistended. Bowel sounds present. No organomegaly or mass.  EXTREMITIES: No cyanosis, clubbing or edema b/l.    NEUROLOGIC: Cranial nerves II through XII are intact. No focal Motor or sensory deficits b/l.   PSYCHIATRIC:  patient is alert and oriented x 3.  SKIN: No obvious rash, lesion, or ulcer.   LABORATORY PANEL:  CBC Recent Labs  Lab 07/30/18 1922  WBC 27.0*  HGB 12.8  HCT 38.5  PLT 506*    Chemistries  Recent Labs  Lab 07/30/18 1922  NA 130*  K 3.9  CL 95*  CO2 24  GLUCOSE 168*  BUN 20  CREATININE 0.70  CALCIUM 8.5*  AST 20  ALT 11  ALKPHOS 82  BILITOT 0.6   Cardiac Enzymes No results for input(s): TROPONINI in the last 168 hours. RADIOLOGY:  Ct Abdomen Pelvis W Contrast  Result Date: 07/30/2018 CLINICAL DATA:  Generalized abdominal pain with diarrhea and vomiting today. Finished antibiotics last week and had: Reconstruction surgery last week. History of colon cancer. EXAM: CT ABDOMEN AND PELVIS WITH CONTRAST TECHNIQUE: Multidetector CT imaging of the abdomen and pelvis was performed using the standard protocol following bolus administration of intravenous contrast. CONTRAST:  179mL ISOVUE-300 IOPAMIDOL (ISOVUE-300) INJECTION 61% COMPARISON:  07/06/2018 FINDINGS: Lower chest: Mild bibasilar atelectasis improved. Hepatobiliary: Liver, gallbladder and biliary tree are normal. Pancreas: Normal. Spleen: Normal. Adrenals/Urinary Tract: 2.6 cm right adrenal mass  and 5.7 cm left adrenal mass unchanged. Kidneys are normal. Ureters and bladder are normal. Stomach/Bowel: Stomach is normal. Small bowel demonstrates a few minimally dilated loops over the right mid to upper abdomen measuring up to  3.6 cm in diameter slightly improved. Evidence of patient's previous partial colectomy within asked most cysts over the right mid abdomen unchanged and patent. There is decompression wall thickening of the colon involving the descending and rectosigmoid colon likely an acute colitis of infectious or inflammatory nature. Vascular/Lymphatic: Calcified plaque over the abdominal aorta. No significant adenopathy. Reproductive: Unremarkable. Other: Postsurgical changes over the abdominal wall most prominent over the right lower abdominal wall. Postsurgical stranding of the mesentery over the right lower quadrant/upper pelvis unchanged. No significant free fluid or free peritoneal air. Musculoskeletal: Unchanged including mild compression deformity of T12. IMPRESSION: Wall thickening involving the descending and rectosigmoid colon likely due to an acute colitis of infectious or inflammatory nature. Few persistent mildly dilated small bowel loops over the right mid to upper abdomen with interval improvement. Postsurgical change compatible previous partial colectomy. Stable bilateral adrenal masses. Aortic Atherosclerosis (ICD10-I70.0). Stable mild compression deformity of T12 Electronically Signed   By: Marin Olp M.D.   On: 07/30/2018 23:36   ASSESSMENT AND PLAN:   LEALA BRYAND is a 72 y.o. female patient had takedown of colostomy done on 9/26.  She received antibiotics for this.  Since then she is been having a lot of abdominal pain and diarrhea.  She had diarrhea 20 times in the last day.  1.  Acute Colitis due to  clostridium difficile.  - Continue oral vancomycin as well as probiotic therapy. -Recently had colon surgery secondary to colon mass and was in the hospital for several days. She did receive antibiotics during that time. -Adding clear liquid diet advance to soft diet.  2.  COPD: Continue inhaled corticosteroid.  Albuterol as needed.  3.  Hyperlipidemia: Continue statin therapy  4.  DVT  prophylaxis: Lovenox  5.  GI prophylaxis: None  Spoke with dter on the phone  Case discussed with Care Management/Social Worker. Management plans discussed with the patient, family and they are in agreement.  CODE STATUS: full  DVT Prophylaxis: lovenox  TOTAL TIME TAKING CARE OF THIS PATIENT: *30* minutes.  >50% time spent on counselling and coordination of care  POSSIBLE D/C IN *1-2 DAYS, DEPENDING ON CLINICAL CONDITION.  Note: This dictation was prepared with Dragon dictation along with smaller phrase technology. Any transcriptional errors that result from this process are unintentional.  Fritzi Mandes M.D on 07/31/2018 at 3:30 PM  Between 7am to 6pm - Pager - [phone]  After 6pm go to [email or web address] - password [email or web address] [facility]  Office  [phone]  CC: Primary care physician; Birdie Sons, MDPatient ID: [name], female   DOB: 05-21-1946, 72 y.o.   MRN: [id number]

## 2018-07-31 NOTE — Progress Notes (Signed)
Pharmacy - Brief Note  Vancomycin capsules benefit check  10-day supply of vanco 125mg  caps will cost patient $3.40.  A discharge prescription was sent to her pharmacy.  Please modify length of therapy as necessary.  Thanks,  Doreene Eland, PharmD, BCPS.   Work Cell: [phone] 07/31/2018 10:19 AM

## 2018-08-01 LAB — CBC
HCT: 32.3 % — ABNORMAL LOW (ref 36.0–46.0)
HEMOGLOBIN: 10.2 g/dL — AB (ref 12.0–15.0)
MCH: 29.9 pg (ref 26.0–34.0)
MCHC: 31.6 g/dL (ref 30.0–36.0)
MCV: 94.7 fL (ref 80.0–100.0)
Platelets: 372 10*3/uL (ref 150–400)
RBC: 3.41 MIL/uL — AB (ref 3.87–5.11)
RDW: 14.5 % (ref 11.5–15.5)
WBC: 13.2 10*3/uL — ABNORMAL HIGH (ref 4.0–10.5)
nRBC: 0 % (ref 0.0–0.2)

## 2018-08-01 NOTE — Progress Notes (Addendum)
Discharge instructions reviewed with patient including followup visits and new medications.  Understanding was verbalized and all questions were answered.  IV removed without complication; patient tolerated well.  Patient discharged home via wheelchair in stable condition escorted by volunteer staff.  

## 2018-08-01 NOTE — Care Management Note (Signed)
Case Management Note  Patient Details  Name: Kimberly [name] MRN: [id number] Date of Birth: 1946-06-15  Subjective/Objective:    Patient to be discharged per MD order. Orders in place for home health services. Patient currently open to [facility] and would prefer to resume services with them. Referral placed with Sharyn Lull from [facility]. Patient has chronic O2 via [facility]. Family to provide transport.                  Action/Plan:   Expected Discharge Date:  08/01/18               Expected Discharge Plan:  [place]  In-House Referral:     Discharge planning Services  CM Consult  Post Acute Care Choice:  Home Health, Resumption of Svcs/PTA Provider Choice offered to:  Patient  DME Arranged:    DME Agency:     HH Arranged:  RN, PT HH Agency:  [facility]  Status of Service:  Completed, signed off  If discussed at [place] of Stay Meetings, dates discussed:    Additional Comments:  Latanya Maudlin, RN 08/01/2018, 11:20 AM

## 2018-08-01 NOTE — Discharge Summary (Signed)
[facility] NAME: Kimberly [name]    MR#:  [id number]  DATE OF BIRTH:  [birth year]  DATE OF ADMISSION:  07/30/2018 ADMITTING PHYSICIAN: Harrie Foreman, MD  DATE OF DISCHARGE: 08/01/2018  PRIMARY CARE PHYSICIAN: Birdie Sons, MD    ADMISSION DIAGNOSIS:  Generalized abdominal pain [R10.84] C. difficile colitis [A04.72]  DISCHARGE DIAGNOSIS:  C. difficile colitis  SECONDARY DIAGNOSIS:   Past Medical History:  Diagnosis Date  . Breast cancer, left (HCC)    Lumpectomy and rad tx's.  . Chronic back pain   . Colon cancer ([place])   . COPD (chronic obstructive pulmonary disease) ([place])   . Degenerative disc disease, lumbar 04/2013  . Dyspnea   . Family history of adverse reaction to anesthesia    son arrested after anesthesia  . GERD (gastroesophageal reflux disease)   . Hyperlipidemia   . Iron deficiency anemia 05/27/2017  . Neuropathy   . Osteoarthritis    of knee  . Osteoporosis   . Small bowel obstruction ([place]) 04/16/2017  . Tobacco abuse   . Vitamin D deficiency     HOSPITAL COURSE:  Kimberly [name] a 72 y.o.femalepatient had takedown of colostomy done on 9/26. She received antibiotics for this. Since then she is been having a lot of abdominal pain and diarrhea. She had diarrhea 20 times in the last day.  1. Acute Colitis due to  clostridium difficile.  -Continue oral vancomycin as well as probiotic therapy. - Recently had colon surgery secondary to colon mass and was in the hospital for several days. She did receive antibiotics during that time. -Adding clear liquid diet advance to soft diet--tolerating well. -diarrhea slowing down--- K 3.9  2. COPD -Continue inhaled corticosteroid.  -Albuterol as needed.  3. Hyperlipidemia: Continue statin therapy  4. DVT prophylaxis: Lovenox  5. GI prophylaxis: PPI  Overall improving. Dietary instructions given. Pt's vancomycin rx has been called in  by the pharmacist.  D/c home later today  CONSULTS OBTAINED:    DRUG ALLERGIES:   Allergies  Allergen Reactions  . Betadine [Povidone Iodine] Other (See Comments)    Topical iodine she states "if you put it in an open sore it will cause a eating ulcer."  . Other Nausea And Vomiting    Antihystamines  . Sinus Formula [Cholestatin] Nausea Only    DISCHARGE MEDICATIONS:   Allergies as of 08/01/2018      Reactions   Betadine [povidone Iodine] Other (See Comments)   Topical iodine she states "if you put it in an open sore it will cause a eating ulcer."   Other Nausea And Vomiting   Antihystamines   Sinus Formula [cholestatin] Nausea Only      Medication List    TAKE these medications   budesonide-formoterol 80-4.5 MCG/ACT inhaler Commonly known as:  SYMBICORT Inhale 2 puffs into the lungs 2 (two) times daily.   cholecalciferol 1000 units tablet Commonly known as:  VITAMIN D Take 1,000 Units by mouth daily.   feeding supplement Liqd Take 1 Container by mouth 3 (three) times daily between meals.   gabapentin 400 MG capsule Commonly known as:  NEURONTIN TAKE 1 CAPSULE TWICE DAILY   HYDROcodone-acetaminophen 7.5-325 MG tablet Commonly known as:  NORCO Take 1-2 tablets by mouth every 6 (six) hours as needed for moderate pain.   ibuprofen 200 MG tablet Commonly known as:  ADVIL,MOTRIN Take 400 mg by mouth daily as needed for headache or moderate pain.  meloxicam 15 MG tablet Commonly known as:  MOBIC TAKE 1 TABLET EVERY DAY AS NEEDED What changed:  See the new instructions.   montelukast 10 MG tablet Commonly known as:  SINGULAIR Take 1 tablet (10 mg total) by mouth daily.   NASACORT ALLERGY 24HR NA Place 2 sprays into the nose daily.   omeprazole 20 MG capsule Commonly known as:  PRILOSEC TAKE 1 CAPSULE EVERY DAY   pravastatin 40 MG tablet Commonly known as:  PRAVACHOL TAKE 1 TABLET EVERY DAY   PROBIOTIC DAILY PO Take 1 capsule by mouth daily.    pyridOXINE 50 MG tablet Commonly known as:  VITAMIN B-6 Take 1 tablet (50 mg total) by mouth daily.   vancomycin 125 MG capsule Commonly known as:  VANCOCIN Take 1 capsule (125 mg total) by mouth 4 (four) times daily for 10 days.   vitamin B-12 1000 MCG tablet Commonly known as:  CYANOCOBALAMIN Take 1,000 mcg by mouth daily.       If you experience worsening of your admission symptoms, develop shortness of breath, life threatening emergency, suicidal or homicidal thoughts you must seek medical attention immediately by calling 911 or calling your MD immediately  if symptoms less severe.  You Must read complete instructions/literature along with all the possible adverse reactions/side effects for all the Medicines you take and that have been prescribed to you. Take any new Medicines after you have completely understood and accept all the possible adverse reactions/side effects.   Please note  You were cared for by a hospitalist during your hospital stay. If you have any questions about your discharge medications or the care you received while you were in the hospital after you are discharged, you can call the unit and asked to speak with the hospitalist on call if the hospitalist that took care of you is not available. Once you are discharged, your primary care physician will handle any further medical issues. Please note that NO REFILLS for any discharge medications will be authorized once you are discharged, as it is imperative that you return to your primary care physician (or establish a relationship with a primary care physician if you do not have one) for your aftercare needs so that they can reassess your need for medications and monitor your lab values. Today   SUBJECTIVE   Doing well. Decreasing diarrhea frequency  VITAL SIGNS:  Blood pressure (!) 122/58, pulse 75, temperature 98.3 F (36.8 C), temperature source Oral, resp. rate 16, height 5\' 4"  (1.626 m), weight 73.5 kg, SpO2  94 %.  I/O:    Intake/Output Summary (Last 24 hours) at 08/01/2018 0847 Last data filed at 07/31/2018 2221 Gross per 24 hour  Intake 1686.62 ml  Output 0 ml  Net 1686.62 ml    PHYSICAL EXAMINATION:  GENERAL:  72 y.o.-year-old patient lying in the bed with no acute distress.  EYES: Pupils equal, round, reactive to light and accommodation. No scleral icterus. Extraocular muscles intact.  HEENT: Head atraumatic, normocephalic. Oropharynx and nasopharynx clear.  NECK:  Supple, no jugular venous distention. No thyroid enlargement, no tenderness.  LUNGS: Normal breath sounds bilaterally, no wheezing, rales,rhonchi or crepitation. No use of accessory muscles of respiration. Oxygen + CARDIOVASCULAR: S1, S2 normal. No murmurs, rubs, or gallops.  ABDOMEN: Soft, non-tender, non-distended. Bowel sounds present. No organomegaly or mass. Surgical scars+ EXTREMITIES: No pedal edema, cyanosis, or clubbing.  NEUROLOGIC: Cranial nerves II through XII are intact. Muscle strength 5/5 in all extremities. Sensation intact. Gait not checked.  PSYCHIATRIC: The patient is alert and oriented x 3.  SKIN: No obvious rash, lesion, or ulcer.   DATA REVIEW:   CBC  Recent Labs  Lab 08/01/18 0507  WBC 13.2*  HGB 10.2*  HCT 32.3*  PLT 372    Chemistries  Recent Labs  Lab 07/30/18 1922  NA 130*  K 3.9  CL 95*  CO2 24  GLUCOSE 168*  BUN 20  CREATININE 0.70  CALCIUM 8.5*  AST 20  ALT 11  ALKPHOS 82  BILITOT 0.6    Microbiology Results   Recent Results (from the past 240 hour(s))  C difficile quick scan w PCR reflex     Status: Abnormal   Collection Time: 07/30/18  7:33 PM  Result Value Ref Range Status   C Diff antigen POSITIVE (A) NEGATIVE Final   C Diff toxin POSITIVE (A) NEGATIVE Final   C Diff interpretation Toxin producing C. difficile detected.  Final    Comment: CRITICAL RESULT CALLED TO, READ BACK BY AND VERIFIED WITH: Martinique LOYE 07/30/18 AT 2037 BY HS Performed at [facility], [address] [place], [place] [address]   Gastrointestinal Panel by PCR , Stool     Status: None   Collection Time: 07/30/18  7:33 PM  Result Value Ref Range Status   Campylobacter species NOT DETECTED NOT DETECTED Final   Plesimonas shigelloides NOT DETECTED NOT DETECTED Final   Salmonella species NOT DETECTED NOT DETECTED Final   Yersinia enterocolitica NOT DETECTED NOT DETECTED Final   Vibrio species NOT DETECTED NOT DETECTED Final   Vibrio cholerae NOT DETECTED NOT DETECTED Final   Enteroaggregative E coli (EAEC) NOT DETECTED NOT DETECTED Final   Enteropathogenic E coli (EPEC) NOT DETECTED NOT DETECTED Final   Enterotoxigenic E coli (ETEC) NOT DETECTED NOT DETECTED Final   Shiga like toxin producing E coli (STEC) NOT DETECTED NOT DETECTED Final   Shigella/Enteroinvasive E coli (EIEC) NOT DETECTED NOT DETECTED Final   Cryptosporidium NOT DETECTED NOT DETECTED Final   Cyclospora cayetanensis NOT DETECTED NOT DETECTED Final   Entamoeba histolytica NOT DETECTED NOT DETECTED Final   Giardia lamblia NOT DETECTED NOT DETECTED Final   Adenovirus F40/41 NOT DETECTED NOT DETECTED Final   Astrovirus NOT DETECTED NOT DETECTED Final   Norovirus GI/GII NOT DETECTED NOT DETECTED Final   Rotavirus A NOT DETECTED NOT DETECTED Final   Sapovirus (I, II, IV, and V) NOT DETECTED NOT DETECTED Final    Comment: Performed at [facility], [address] [place], [place] [address]    RADIOLOGY:  Ct Abdomen Pelvis W Contrast  Result Date: 07/30/2018 CLINICAL DATA:  Generalized abdominal pain with diarrhea and vomiting today. Finished antibiotics last week and had: Reconstruction surgery last week. History of colon cancer. EXAM: CT ABDOMEN AND PELVIS WITH CONTRAST TECHNIQUE: Multidetector CT imaging of the abdomen and pelvis was performed using the standard protocol following bolus administration of intravenous contrast. CONTRAST:  121mL ISOVUE-300 IOPAMIDOL (ISOVUE-300)  INJECTION 61% COMPARISON:  07/06/2018 FINDINGS: Lower chest: Mild bibasilar atelectasis improved. Hepatobiliary: Liver, gallbladder and biliary tree are normal. Pancreas: Normal. Spleen: Normal. Adrenals/Urinary Tract: 2.6 cm right adrenal mass and 5.7 cm left adrenal mass unchanged. Kidneys are normal. Ureters and bladder are normal. Stomach/Bowel: Stomach is normal. Small bowel demonstrates a few minimally dilated loops over the right mid to upper abdomen measuring up to 3.6 cm in diameter slightly improved. Evidence of patient's previous partial colectomy within asked most cysts over the right mid abdomen unchanged and  patent. There is decompression wall thickening of the colon involving the descending and rectosigmoid colon likely an acute colitis of infectious or inflammatory nature. Vascular/Lymphatic: Calcified plaque over the abdominal aorta. No significant adenopathy. Reproductive: Unremarkable. Other: Postsurgical changes over the abdominal wall most prominent over the right lower abdominal wall. Postsurgical stranding of the mesentery over the right lower quadrant/upper pelvis unchanged. No significant free fluid or free peritoneal air. Musculoskeletal: Unchanged including mild compression deformity of T12. IMPRESSION: Wall thickening involving the descending and rectosigmoid colon likely due to an acute colitis of infectious or inflammatory nature. Few persistent mildly dilated small bowel loops over the right mid to upper abdomen with interval improvement. Postsurgical change compatible previous partial colectomy. Stable bilateral adrenal masses. Aortic Atherosclerosis (ICD10-I70.0). Stable mild compression deformity of T12 Electronically Signed   By: Marin Olp M.D.   On: 07/30/2018 23:36     Management plans discussed with the patient, family and they are in agreement.  CODE STATUS:     Code Status Orders  (From admission, onward)         Start     Ordered   07/31/18 0444  Full code   Continuous     07/31/18 0443        Code Status History    Date Active Date Inactive Code Status Order ID Comments User Context   06/25/2018 2009 07/13/2018 1848 Full Code [id number]  Jules Husbands, MD Inpatient   12/01/2017 1437 12/05/2017 1955 Full Code [id number]  Nestor Lewandowsky, MD Inpatient   04/15/2017 0317 04/19/2017 1822 Full Code [id number]  Harrie Foreman, MD Inpatient    Advance Directive Documentation     Most Recent Value  Type of Advance Directive  Living will, Healthcare Power of Attorney  Pre-existing out of facility DNR order (yellow form or pink MOST form)  -  "MOST" Form in Place?  -      TOTAL TIME TAKING CARE OF THIS PATIENT: *40* minutes.    Fritzi Mandes M.D on 08/01/2018 at 8:47 AM  Between 7am to 6pm - Pager - [phone] After 6pm go to [email or web address] - password [email or web address] [facility]  Office  [phone]  CC: Primary care physician; Birdie Sons, MD

## 2018-08-03 ENCOUNTER — Encounter: Payer: Self-pay | Admitting: Surgery

## 2018-08-03 ENCOUNTER — Telehealth: Payer: Self-pay

## 2018-08-03 ENCOUNTER — Other Ambulatory Visit: Payer: Self-pay

## 2018-08-03 ENCOUNTER — Ambulatory Visit (INDEPENDENT_AMBULATORY_CARE_PROVIDER_SITE_OTHER): Payer: Medicare HMO | Admitting: Surgery

## 2018-08-03 VITALS — BP 162/79 | HR 98 | Temp 97.7°F | Wt 160.0 lb

## 2018-08-03 DIAGNOSIS — Z09 Encounter for follow-up examination after completed treatment for conditions other than malignant neoplasm: Secondary | ICD-10-CM

## 2018-08-03 NOTE — Patient Instructions (Addendum)
I'm glad that you are doing better with your surgery.  Please continue taking your antibiotic and make sure that you finish it.

## 2018-08-03 NOTE — Progress Notes (Signed)
S/p ileostomy takedown and parastomal hernia repair 9/26 Recent hospitalization for C diff Still on vanc and doing very well Significant improvement  w Diarrhea  PE NAD Abd: soft, nt, incisions c/d/i, no infection or peritonitis. No hernia recurrence   A/P DOing well No surgical complications RTC prn

## 2018-08-03 NOTE — Telephone Encounter (Signed)
Transition Care Management Follow-up Telephone Call  Date of discharge and from where: [facility] on 08/01/18.  How have you been since you were released from the hospital? Doing better, still having abdominal pains but they are less frequent. Has occasional nausea when has an urge for a bowel movement. Declined fever or v/d. Stool consistency is soft.  Any questions or concerns? No   Items Reviewed:  Did the pt receive and understand the discharge instructions provided? Yes   Medications obtained and verified? Will review at [place] apt.  Any new allergies since your discharge? No   Dietary orders reviewed? Yes  Do you have support at home? Yes   Other (ie: DME, Home Health, etc) Yes, [facility] nurse is coming out to home.  Functional Questionnaire: (I = Independent and D = Dependent)  Bathing/Dressing- I   Meal Prep- I  Eating- I  Maintaining continence- I  Transferring/Ambulation- I  Managing Meds- I   Follow up appointments reviewed:    PCP Hospital f/u appt confirmed? Yes  Scheduled to see Dr Caryn Section on 08/10/18 @ 3:30 PM.  [facility] f/u appt confirmed? N/A   Are transportation arrangements needed? No   If their condition worsens, is the pt aware to call  their PCP or go to the ED? Yes  Was the patient provided with contact information for the PCP's office or ED? Yes  Was the pt encouraged to call back with questions or concerns? Yes

## 2018-08-04 ENCOUNTER — Other Ambulatory Visit: Payer: Self-pay

## 2018-08-04 NOTE — Patient Outreach (Addendum)
[facility]) Care Management  08/04/2018  Kimberly [name] 10/14/1945 [id number]  EMMI: general discharge red alert Referral date: 08/04/18 Referral reason: Know who to call about changes in condition:  NO Insurance: [employer] Day # 1  Telephone call to patient regarding EMMI general discharge red alert.  HIPAA verified with patient. Explained reason for call.   Patient states she knows which doctor to call if she is having problems. Patient states she had a follow up appointment with the surgeon on yesterday 08/03/18.  Patient states she is scheduled to see her primary MD on 08/10/18.   Patient states she continues to have some abdominal [name].  She states her doctor is aware and informed her that the [name] was from gas which should resolve.  Patient states she is taking a Prilosec to help with this.  Patient denies having any diarrhea or constipation.  Patient states she is adhering to a soft diet with no fried or fatty foods.   RNCM advised patient to continue her fluid intake for hydration. Patient verbalized understanding.  Patient states the nurse with [facility] was suppose to see her yesterday or today.  She states she has not heard from the nurse. RNCM confirmed patient had contact phone number for [facility]. Patient states she did have the contact phone number. RNCM advised patient to call [facility] to find out about nurse visit.   Patient states she has her medications and takes them as prescribed. Patient states she has transportation to her appointments.  Patient denies any further needs or concerns at this time.  RNCM advised patient to notify MD of any changes in condition prior to scheduled appointment. RNCM provided contact name and number: 24 hour nurse advise line [phone].  RNCM verified patient aware of 911 services for urgent/ emergent needs.   PLAN: RNCM will close patient due to patient being assessed and having no further needs.  RNCM will send closure letter to  patient's primary MD  Quinn Plowman RN,BSN,CCM [facility] Telephonic  [phone]

## 2018-08-08 DIAGNOSIS — M5136 Other intervertebral disc degeneration, lumbar region: Secondary | ICD-10-CM | POA: Diagnosis not present

## 2018-08-08 DIAGNOSIS — G8929 Other chronic pain: Secondary | ICD-10-CM | POA: Diagnosis not present

## 2018-08-08 DIAGNOSIS — D509 Iron deficiency anemia, unspecified: Secondary | ICD-10-CM | POA: Diagnosis not present

## 2018-08-08 DIAGNOSIS — K435 Parastomal hernia without obstruction or  gangrene: Secondary | ICD-10-CM | POA: Diagnosis not present

## 2018-08-08 DIAGNOSIS — M170 Bilateral primary osteoarthritis of knee: Secondary | ICD-10-CM | POA: Diagnosis not present

## 2018-08-08 DIAGNOSIS — M810 Age-related osteoporosis without current pathological fracture: Secondary | ICD-10-CM | POA: Diagnosis not present

## 2018-08-08 DIAGNOSIS — E559 Vitamin D deficiency, unspecified: Secondary | ICD-10-CM | POA: Diagnosis not present

## 2018-08-08 DIAGNOSIS — E785 Hyperlipidemia, unspecified: Secondary | ICD-10-CM | POA: Diagnosis not present

## 2018-08-08 DIAGNOSIS — G629 Polyneuropathy, unspecified: Secondary | ICD-10-CM | POA: Diagnosis not present

## 2018-08-09 ENCOUNTER — Encounter: Payer: Self-pay | Admitting: Family Medicine

## 2018-08-09 DIAGNOSIS — J449 Chronic obstructive pulmonary disease, unspecified: Secondary | ICD-10-CM | POA: Diagnosis not present

## 2018-08-09 DIAGNOSIS — R0902 Hypoxemia: Secondary | ICD-10-CM | POA: Diagnosis not present

## 2018-08-10 ENCOUNTER — Other Ambulatory Visit: Payer: Self-pay | Admitting: Family Medicine

## 2018-08-10 ENCOUNTER — Encounter: Payer: Self-pay | Admitting: Family Medicine

## 2018-08-10 ENCOUNTER — Ambulatory Visit (INDEPENDENT_AMBULATORY_CARE_PROVIDER_SITE_OTHER): Payer: Medicare HMO | Admitting: Family Medicine

## 2018-08-10 VITALS — BP 120/62 | HR 90 | Temp 98.6°F | Resp 16 | Wt 158.0 lb

## 2018-08-10 DIAGNOSIS — A0472 Enterocolitis due to Clostridium difficile, not specified as recurrent: Secondary | ICD-10-CM

## 2018-08-10 NOTE — Patient Instructions (Signed)
   Take an OTC probiotic such as Align ever day   Avoid dairy products and fruit juices   Call if you have severe diarrhea or loose stools that last more than a day

## 2018-08-10 NOTE — Progress Notes (Signed)
Patient: Kimberly [name] Female    DOB: 1945-12-27   72 y.o.   MRN: [id number] Visit Date: 08/10/2018  Today's Provider: Lelon Huh, MD   Chief Complaint  Patient presents with  . Hospitalization Follow-up   Subjective:    HPI  Follow up Hospitalization  Patient was admitted to [facility] on 07/30/2018 and discharged on 08/01/2018. She was treated for C. Diff.colitis Treatment for this included antibiotics. Upon discharge patient was advised to continue on 10 days course oral Vancomycin as well as probiotic therapy. Patient was placed on clear liquid diet and was  advanced to soft diet.  She reports good compliance with treatment. She reports this condition is Improved.  BM have returned to normal. Is tolerating vancomycin. Is on last dose today. No fevers, chills or sweats.   ------------------------------------------------------------------------------------       Allergies  Allergen Reactions  . Betadine [Povidone Iodine] Other (See Comments)    Topical iodine she states "if you put it in an open sore it will cause a eating ulcer."  . Other Nausea And Vomiting    Antihystamines  . Sinus Formula [Cholestatin] Nausea Only     Current Outpatient Medications:  .  budesonide-formoterol (SYMBICORT) 80-4.5 MCG/ACT inhaler, Inhale 2 puffs into the lungs 2 (two) times daily., Disp: 3 Inhaler, Rfl: 3 .  cholecalciferol (VITAMIN D) 1000 units tablet, Take 1,000 Units by mouth daily., Disp: , Rfl:  .  feeding supplement (BOOST / RESOURCE BREEZE) LIQD, Take 1 Container by mouth 3 (three) times daily between meals., Disp: 30 Container, Rfl: 0 .  gabapentin (NEURONTIN) 400 MG capsule, TAKE 1 CAPSULE TWICE DAILY (Patient taking differently: Take 400 mg by mouth 2 (two) times daily. ), Disp: 180 capsule, Rfl: 4 .  HYDROcodone-acetaminophen (NORCO) 7.5-325 MG tablet, Take 1-2 tablets by mouth every 6 (six) hours as needed for moderate pain., Disp: 120 tablet, Rfl: 0 .  ibuprofen  (ADVIL,MOTRIN) 200 MG tablet, Take 400 mg by mouth daily as needed for headache or moderate pain., Disp: , Rfl:  .  meloxicam (MOBIC) 15 MG tablet, TAKE 1 TABLET EVERY DAY AS NEEDED (Patient taking differently: Take 15 mg by mouth daily as needed for pain. ), Disp: 90 tablet, Rfl: 4 .  montelukast (SINGULAIR) 10 MG tablet, Take 1 tablet (10 mg total) by mouth daily., Disp: 90 tablet, Rfl: 1 .  omeprazole (PRILOSEC) 20 MG capsule, TAKE 1 CAPSULE EVERY DAY (Patient taking differently: Take 20 mg by mouth daily. ), Disp: 90 capsule, Rfl: 4 .  pravastatin (PRAVACHOL) 40 MG tablet, TAKE 1 TABLET EVERY DAY (Patient taking differently: Take 40 mg by mouth daily. ), Disp: 90 tablet, Rfl: 3 .  Probiotic Product (PROBIOTIC DAILY PO), Take 1 capsule by mouth daily. , Disp: , Rfl:  .  pyridOXINE (VITAMIN B-6) 50 MG tablet, Take 1 tablet (50 mg total) by mouth daily., Disp: 30 tablet, Rfl: 3 .  Triamcinolone Acetonide (NASACORT ALLERGY 24HR NA), Place 2 sprays into the nose daily. , Disp: , Rfl:  .  vancomycin (VANCOCIN HCL) 125 MG capsule, Take 1 capsule (125 mg total) by mouth 4 (four) times daily for 10 days., Disp: 40 capsule, Rfl: 0 .  vitamin B-12 (CYANOCOBALAMIN) 1000 MCG tablet, Take 1,000 mcg by mouth daily., Disp: , Rfl:  No current facility-administered medications for this visit.   Facility-Administered Medications Ordered in Other Visits:  .  0.9 %  sodium chloride infusion, , Intravenous, Continuous, Earlie Server, MD, Stopped  at 07/22/17 1150 .  heparin lock flush 100 unit/mL, 500 Units, Intravenous, Once, Earlie Server, MD .  heparin lock flush 100 unit/mL, 500 Units, Intravenous, Once, Earlie Server, MD .  heparin lock flush 100 unit/mL, 500 Units, Intravenous, Once, Earlie Server, MD .  sodium chloride flush (NS) 0.9 % injection 10 mL, 10 mL, Intracatheter, PRN, Earlie Server, MD .  sodium chloride flush (NS) 0.9 % injection 10 mL, 10 mL, Intravenous, PRN, Earlie Server, MD, 10 mL at 05/29/17 1347  Review of Systems    Constitutional: Negative for appetite change, chills, fatigue and fever.  Respiratory: Negative for chest tightness and shortness of breath.   Cardiovascular: Negative for chest pain and palpitations.  Gastrointestinal: Positive for abdominal pain and nausea. Negative for vomiting.  Neurological: Negative for dizziness and weakness.    Social History   Tobacco Use  . Smoking status: Former Smoker    Packs/day: 0.25    Years: 55.00    Pack years: 13.75    Types: Cigarettes    Last attempt to quit: 05/21/2017    Years since quitting: 1.2  . Smokeless tobacco: Never Used  . Tobacco comment: started age 50 1/2 to 1 ppd  Substance Use Topics  . Alcohol use: Yes    Alcohol/week: 0.0 standard drinks    Comment: occasionally drinks beer   Objective:   BP 120/62 (BP Location: Left Arm, Patient Position: Sitting, Cuff Size: Normal)   Pulse 90   Temp 98.6 F (37 C) (Oral)   Resp 16   Wt 158 lb (71.7 kg)   SpO2 95% Comment: room air  BMI 27.12 kg/m  Vitals:   08/10/18 1538  BP: 120/62  Pulse: 90  Resp: 16  Temp: 98.6 F (37 C)  TempSrc: Oral  SpO2: 95%  Weight: 158 lb (71.7 kg)     Physical Exam  General Appearance:    Alert, cooperative, no distress  Eyes:    PERRL, conjunctiva/corneas clear, EOM's intact       Lungs:     Clear to auscultation bilaterally, respirations unlabored  Heart:    Regular rate and rhythm  Abdomen:   bowel sounds present and normal in all 4 quadrants, soft, round, nontender or nondistended. No CVA tenderness      Assessment & Plan:     1. Clostridium difficile colitis Greatly improved on last dose of vancomycin. Encourage to start probiotics. Call if she starts to have severe or consistently loose stools.        Lelon Huh, MD  [facility]

## 2018-08-12 ENCOUNTER — Telehealth: Payer: Self-pay | Admitting: Family Medicine

## 2018-08-12 DIAGNOSIS — Z9981 Dependence on supplemental oxygen: Secondary | ICD-10-CM | POA: Insufficient documentation

## 2018-08-12 NOTE — Telephone Encounter (Signed)
Pt advised.   Thanks,   -David Towson  

## 2018-08-12 NOTE — Telephone Encounter (Signed)
Please advise patient that o/n oximetry shows very low oxygen levels and she needs to wear her oxygen when sleeping. Does not need to use during the day while at rest.

## 2018-08-12 NOTE — Telephone Encounter (Signed)
We sent an order for portable oxygen yesterday. Let me know if she doesn't hear from home health about it by next week.

## 2018-08-12 NOTE — Telephone Encounter (Signed)
Pt advised.  She states she has an oxygen tank for when she sleeps.  She says she needs a portable one for when she goes out and has to walk around.  She says she gets short of breath if she has to walk any distance.  Please advise.  Thanks,   -Mickel Baas

## 2018-08-13 DIAGNOSIS — D509 Iron deficiency anemia, unspecified: Secondary | ICD-10-CM | POA: Diagnosis not present

## 2018-08-13 DIAGNOSIS — M170 Bilateral primary osteoarthritis of knee: Secondary | ICD-10-CM | POA: Diagnosis not present

## 2018-08-13 DIAGNOSIS — E785 Hyperlipidemia, unspecified: Secondary | ICD-10-CM | POA: Diagnosis not present

## 2018-08-13 DIAGNOSIS — R0602 Shortness of breath: Secondary | ICD-10-CM | POA: Diagnosis not present

## 2018-08-13 DIAGNOSIS — S72046A Nondisplaced fracture of base of neck of unspecified femur, initial encounter for closed fracture: Secondary | ICD-10-CM | POA: Diagnosis not present

## 2018-08-13 DIAGNOSIS — E559 Vitamin D deficiency, unspecified: Secondary | ICD-10-CM | POA: Diagnosis not present

## 2018-08-13 DIAGNOSIS — G629 Polyneuropathy, unspecified: Secondary | ICD-10-CM | POA: Diagnosis not present

## 2018-08-13 DIAGNOSIS — M810 Age-related osteoporosis without current pathological fracture: Secondary | ICD-10-CM | POA: Diagnosis not present

## 2018-08-13 DIAGNOSIS — M5136 Other intervertebral disc degeneration, lumbar region: Secondary | ICD-10-CM | POA: Diagnosis not present

## 2018-08-13 DIAGNOSIS — G8929 Other chronic pain: Secondary | ICD-10-CM | POA: Diagnosis not present

## 2018-08-13 DIAGNOSIS — K435 Parastomal hernia without obstruction or  gangrene: Secondary | ICD-10-CM | POA: Diagnosis not present

## 2018-08-13 DIAGNOSIS — J441 Chronic obstructive pulmonary disease with (acute) exacerbation: Secondary | ICD-10-CM | POA: Diagnosis not present

## 2018-08-17 ENCOUNTER — Telehealth: Payer: Self-pay

## 2018-08-17 NOTE — Telephone Encounter (Signed)
Patient called reporting diarrhea since Saturday. Patient reports one episode this morning denies any blood in stool, or vomiting. Patient reports taking Pepto Bismol. CB# [phone] [id number].

## 2018-08-17 NOTE — Telephone Encounter (Signed)
Patient was recently seen on 08/10/2018 for C. Diff. Patient was told to call our office if she had severe diarrhea or loose stool that last more than one day. Patient states she has had diarrhea since Saturday. She states she has taken Pepto Bismol and the frequency of diarrhea has decreased. Yesterday she had 3-4 episodes of diarrhea. Today she has only had one episode of loose watery stools.

## 2018-08-17 NOTE — Telephone Encounter (Signed)
If not better by Tuesday, then need to restart vancomycin and go to labs to get order to check stool for c. Diff.

## 2018-08-18 ENCOUNTER — Telehealth: Payer: Self-pay

## 2018-08-18 DIAGNOSIS — R197 Diarrhea, unspecified: Secondary | ICD-10-CM

## 2018-08-18 DIAGNOSIS — D509 Iron deficiency anemia, unspecified: Secondary | ICD-10-CM | POA: Diagnosis not present

## 2018-08-18 DIAGNOSIS — G629 Polyneuropathy, unspecified: Secondary | ICD-10-CM | POA: Diagnosis not present

## 2018-08-18 DIAGNOSIS — M170 Bilateral primary osteoarthritis of knee: Secondary | ICD-10-CM | POA: Diagnosis not present

## 2018-08-18 DIAGNOSIS — E559 Vitamin D deficiency, unspecified: Secondary | ICD-10-CM | POA: Diagnosis not present

## 2018-08-18 DIAGNOSIS — M5136 Other intervertebral disc degeneration, lumbar region: Secondary | ICD-10-CM | POA: Diagnosis not present

## 2018-08-18 DIAGNOSIS — E785 Hyperlipidemia, unspecified: Secondary | ICD-10-CM | POA: Diagnosis not present

## 2018-08-18 DIAGNOSIS — G8929 Other chronic pain: Secondary | ICD-10-CM | POA: Diagnosis not present

## 2018-08-18 DIAGNOSIS — M810 Age-related osteoporosis without current pathological fracture: Secondary | ICD-10-CM | POA: Diagnosis not present

## 2018-08-18 DIAGNOSIS — K435 Parastomal hernia without obstruction or  gangrene: Secondary | ICD-10-CM | POA: Diagnosis not present

## 2018-08-18 NOTE — Telephone Encounter (Signed)
Pt called back stating she is having with nausea, no vomiting.  She states this has been happening before having an episode of diarrhea. She does report her nausea is a little worse today. She did have a bowel movement today but it was normal.  Pt is wondering if she needs a GI referral?  Her pharmacy is [facility].    Please advise.  Thanks,   -Mickel Baas

## 2018-08-18 NOTE — Telephone Encounter (Signed)
Pt states she has not had any episodes of diarrhea today so far.  She states she feels well, denies fevers, chills, bloody stool.  I advised her if it happens again today to give [place] a call back and she will restart the vancomycin and get the labs.  She agreed.   Thanks,   -Mickel Baas

## 2018-08-19 MED ORDER — ONDANSETRON HCL 4 MG PO TABS: 2.0000 mg | ORAL_TABLET | Freq: Three times a day (TID) | ORAL | 0 refills | Status: DC | PRN

## 2018-08-19 NOTE — Telephone Encounter (Signed)
Patient advised. Lab slip printed and placed in [address]. Patient states she would try to come by the office on Friday to have lab done. Patient also wants to know what she can take for the nausea. [facility] uses ([facility] [address] Please advise.

## 2018-08-19 NOTE — Telephone Encounter (Signed)
[facility]

## 2018-08-19 NOTE — Telephone Encounter (Signed)
She needs to recheck stool for c. Diff. Have entered order, she needs to go to lab to get collection kit.

## 2018-08-20 NOTE — Telephone Encounter (Signed)
Patient was advised.  

## 2018-08-21 ENCOUNTER — Inpatient Hospital Stay: Payer: Medicare HMO

## 2018-08-21 ENCOUNTER — Encounter: Payer: Self-pay | Admitting: Oncology

## 2018-08-21 ENCOUNTER — Other Ambulatory Visit: Payer: Self-pay | Admitting: Family Medicine

## 2018-08-21 ENCOUNTER — Other Ambulatory Visit: Payer: Self-pay

## 2018-08-21 ENCOUNTER — Inpatient Hospital Stay: Payer: Medicare HMO | Attending: Oncology

## 2018-08-21 ENCOUNTER — Telehealth: Payer: Self-pay | Admitting: *Deleted

## 2018-08-21 ENCOUNTER — Inpatient Hospital Stay (HOSPITAL_BASED_OUTPATIENT_CLINIC_OR_DEPARTMENT_OTHER): Payer: Medicare HMO | Admitting: Oncology

## 2018-08-21 VITALS — BP 110/75 | HR 108 | Temp 98.0°F | Resp 18 | Wt 154.9 lb

## 2018-08-21 DIAGNOSIS — Z79899 Other long term (current) drug therapy: Secondary | ICD-10-CM | POA: Diagnosis not present

## 2018-08-21 DIAGNOSIS — R531 Weakness: Secondary | ICD-10-CM | POA: Diagnosis not present

## 2018-08-21 DIAGNOSIS — Z95828 Presence of other vascular implants and grafts: Secondary | ICD-10-CM

## 2018-08-21 DIAGNOSIS — Z853 Personal history of malignant neoplasm of breast: Secondary | ICD-10-CM | POA: Diagnosis not present

## 2018-08-21 DIAGNOSIS — A09 Infectious gastroenteritis and colitis, unspecified: Secondary | ICD-10-CM | POA: Diagnosis not present

## 2018-08-21 DIAGNOSIS — Z791 Long term (current) use of non-steroidal anti-inflammatories (NSAID): Secondary | ICD-10-CM

## 2018-08-21 DIAGNOSIS — A0472 Enterocolitis due to Clostridium difficile, not specified as recurrent: Secondary | ICD-10-CM | POA: Diagnosis not present

## 2018-08-21 DIAGNOSIS — C184 Malignant neoplasm of transverse colon: Secondary | ICD-10-CM | POA: Insufficient documentation

## 2018-08-21 DIAGNOSIS — Z9221 Personal history of antineoplastic chemotherapy: Secondary | ICD-10-CM

## 2018-08-21 DIAGNOSIS — Z8249 Family history of ischemic heart disease and other diseases of the circulatory system: Secondary | ICD-10-CM | POA: Diagnosis not present

## 2018-08-21 DIAGNOSIS — Z87891 Personal history of nicotine dependence: Secondary | ICD-10-CM | POA: Diagnosis not present

## 2018-08-21 DIAGNOSIS — M5136 Other intervertebral disc degeneration, lumbar region: Secondary | ICD-10-CM

## 2018-08-21 DIAGNOSIS — G629 Polyneuropathy, unspecified: Secondary | ICD-10-CM

## 2018-08-21 DIAGNOSIS — R197 Diarrhea, unspecified: Secondary | ICD-10-CM | POA: Diagnosis not present

## 2018-08-21 NOTE — Telephone Encounter (Signed)
Kimberly [name] called asking that a call be returned to clarify some things that was discussed a this mornings appointment. Please return her call [phone]

## 2018-08-21 NOTE — Progress Notes (Signed)
Survivorship Care Plan visit completed.  Treatment summary reviewed and given to patient.  ASCO answers booklet reviewed and given to patient.  CARE program and Cancer Transitions discussed with patient along with other resources cancer center offers to patients and caregivers.  Patient verbalized understanding.    

## 2018-08-21 NOTE — Telephone Encounter (Signed)
Can you call and figure out what are her questions? Thanks

## 2018-08-21 NOTE — Progress Notes (Signed)
Patient her for follow up. Complains of nausea, for which she was prescribed zofran by Dr. Caryn Section. She also started on 1L of O@ at night.

## 2018-08-23 ENCOUNTER — Other Ambulatory Visit: Payer: Self-pay | Admitting: Family Medicine

## 2018-08-23 MED ORDER — VANCOMYCIN HCL 125 MG PO CAPS: 125.0000 mg | ORAL_CAPSULE | Freq: Four times a day (QID) | ORAL | 0 refills | Status: DC

## 2018-08-23 NOTE — Progress Notes (Signed)
[facility] Follow up  Visit:  Patient Care Team: Birdie Sons, MD as PCP - General (Family Medicine) Earlie Server, MD as Consulting Physician (Oncology) Noreene Filbert, MD as Referring Physician (Radiation Oncology) Jules Husbands, MD as Consulting Physician (General Surgery)  PURPOSE OF VISIT: Adjuvant chemotherapy for colon cancer  HISTORY OF PRESENTING ILLNESS: Kimberly [name] 72 y.o. female with PMH listed as below presents for follow up for the management of Stage IIIB Colon Cancer Patient reports a remote history of breast cancer s/p lumpectomy and radiation treatments. Patient had been having abdominal pain, weight loss and bloating.  CT scan showed partial obstruction at the level of transverse colon and ileocolic mesenteric adenopathy. She was prepared for a colonoscopy on 04/15/2017. She started to have worsened abdominal pain, nausea vomiting, unable to maintain oral intake and was sent to ED. She was found to have bowel obstruction and underwent  exploratory laparotomy with transverse colectomy, with end colostomy and mucous fistula formation for obstructing colon lesion. Differential diagnosis prior to surgery was metastatic breast cancer in colon vs colon cancer. The patient did have a colonoscopy 2 years ago without the mass being present at that time but did have 2 small polyps in the transverse colon that were removed.  04/15/2017 Patient underwent transverse colon resection.  Pathology showed T4aN1 moderate differentiated adenocarcinoma, negative margin, 3/16 lymph nodes involved with cancer. Low probability of MSI-H.   # Genetic test INVITAE came back negative. No pathogenetic sequence variants or deletions/duplications identified. Results was scanned to [employer] (a copy of the report was provided to patient and her daughter)  # # baseline CEA is 2.1  TREATMENT: adjuvant FOLFOX, Oxaliplatin was discontinued after 3 cycles due to worsening of pre-existing  neuropathy. Finished another 9 cycles of 5-FU.  S/p adjuvant RT.   INTERVAL HISTORY Patient presents for follow-up of colon cancer. During the interval, patient has had colostomy reversal on 06/25/2018, subsequently developed C. difficile colitis.  Patient was treated with a course of vancomycin and probiotic.  Diarrhea resolved for period of time and now feels like the diarrhea has recurred. She has multiple episodes of diarrhea today.  She has set up an appointment with primary care physician Dr. Caryn Section to have stool checked today. She feels weak as she has been through so much since recent surgery.  She has lost 6 pounds since the beginning of this month. Chronic neuropathy has been stable.  Review of Systems  Constitutional: Negative for chills, fatigue, fever and unexpected weight change.  HENT:   Negative for hearing loss, lump/mass and nosebleeds.   Eyes: Negative for eye problems.  Respiratory: Negative for chest tightness, cough and shortness of breath.   Cardiovascular: Negative for chest pain and leg swelling.  Gastrointestinal: Positive for diarrhea. Negative for abdominal distention, abdominal pain, constipation and nausea.  Endocrine: Negative for hot flashes.  Genitourinary: Negative for difficulty urinating, dysuria and frequency.   Musculoskeletal: Negative for arthralgias, gait problem and myalgias.  Skin: Negative for rash and wound.  Neurological: Negative for dizziness, gait problem and headaches.       Chronic numbness and tingling of fingertips and toes.  Hematological: Negative for adenopathy. Does not bruise/bleed easily.  Psychiatric/Behavioral: Negative for confusion, decreased concentration and depression. The patient is not nervous/anxious.       MEDICAL HISTORY: Past Medical History:  Diagnosis Date  . Breast cancer, left (HCC)    Lumpectomy and rad tx's.  . Chronic back pain   .  Colon cancer ([place])   . COPD (chronic obstructive pulmonary disease) ([place])    . Degenerative disc disease, lumbar 04/2013  . Dyspnea   . Family history of adverse reaction to anesthesia    son arrested after anesthesia  . GERD (gastroesophageal reflux disease)   . Hyperlipidemia   . Iron deficiency anemia 05/27/2017  . Neuropathy   . Osteoarthritis    of knee  . Osteoporosis   . Small bowel obstruction ([place]) 04/16/2017  . Tobacco abuse   . Vitamin D deficiency     SURGICAL HISTORY: Past Surgical History:  Procedure Laterality Date  . APPENDECTOMY  1965  . BREAST EXCISIONAL BIOPSY Left 08/01/2003   lumpectomy rad 11/04-2/28/2005  . BREAST SURGERY    . CESAREAN SECTION     x3  . COLON SURGERY    . COLONOSCOPY W/ POLYPECTOMY    . COLONOSCOPY WITH PROPOFOL N/A 04/09/2018   Procedure: COLONOSCOPY WITH PROPOFOL;  Surgeon: Virgel Manifold, MD;  Location: [facility];  Service: Gastroenterology;  Laterality: N/A;  . COLOSTOMY TAKEDOWN N/A 06/25/2018   Procedure: COLOSTOMY TAKEDOWN;  Surgeon: Jules Husbands, MD;  Location: [facility];  Service: General;  Laterality: N/A;  . ESOPHAGOGASTRODUODENOSCOPY (EGD) WITH PROPOFOL N/A 04/04/2017   Procedure: ESOPHAGOGASTRODUODENOSCOPY (EGD) WITH PROPOFOL;  Surgeon: [name], MD;  Location: [place];  Service: Endoscopy;  Laterality: N/A;  . FRACTURE SURGERY    . HIP FRACTURE SURGERY Left 01/25/2012  . JOINT REPLACEMENT    . LAPAROTOMY N/A 04/15/2017   Procedure: EXPLORATORY LAPAROTOMY;  Surgeon: Clayburn Pert, MD;  Location: [facility];  Service: General;  Laterality: N/A;  . NECK SURGERY  12/2011  . PARASTOMAL HERNIA REPAIR N/A 12/03/2017   Procedure: HERNIA REPAIR PARASTOMAL;  Surgeon: Jules Husbands, MD;  Location: [facility];  Service: General;  Laterality: N/A;  . PARASTOMAL HERNIA REPAIR N/A 06/25/2018   Procedure: HERNIA REPAIR PARASTOMAL;  Surgeon: Jules Husbands, MD;  Location: [facility];  Service: General;  Laterality: N/A;  . PORTACATH PLACEMENT Right 05/21/2017   Procedure: INSERTION PORT-A-CATH;   Surgeon: Clayburn Pert, MD;  Location: [facility];  Service: General;  Laterality: Right;  . WRIST FRACTURE SURGERY      SOCIAL HISTORY: Social History   Socioeconomic History  . Marital status: Divorced    Spouse name: Not on file  . Number of children: 3  . Years of education: Not on file  . Highest education level: 7th grade  Occupational History  . Occupation: retired  Scientific laboratory technician  . Financial resource strain: Not hard at all  . Food insecurity:    Worry: Never true    Inability: Never true  . Transportation needs:    Medical: No    Non-medical: No  Tobacco Use  . Smoking status: Former Smoker    Packs/day: 0.25    Years: 55.00    Pack years: 13.75    Types: Cigarettes    Last attempt to quit: 05/21/2017    Years since quitting: 1.2  . Smokeless tobacco: Never Used  . Tobacco comment: started age 5 1/3 to 1 ppd  Substance and Sexual Activity  . Alcohol use: Yes    Alcohol/week: 0.0 standard drinks    Comment: occasionally drinks beer  . Drug use: No  . Sexual activity: Never  Lifestyle  . Physical activity:    Days per week: Not on file    Minutes per session: Not on file  . Stress: Not at all  Relationships  .  Social connections:    Talks on phone: Not on file    Gets together: Not on file    Attends religious service: Not on file    Active member of club or organization: Not on file    Attends meetings of clubs or organizations: Not on file    Relationship status: Not on file  . Intimate partner violence:    Fear of current or ex partner: Not on file    Emotionally abused: Not on file    Physically abused: Not on file    Forced sexual activity: Not on file  Other Topics Concern  . Not on file  Social History Narrative  . Not on file    FAMILY HISTORY Family History  Problem Relation Age of Onset  . Diabetes Mother        type 2  . Coronary artery disease Mother   . Deep vein thrombosis Mother   . Colon cancer Father   . Asthma Brother    . Diabetes Brother        type 2    ALLERGIES:  is allergic to betadine [povidone iodine]; other; and sinus formula [cholestatin].  MEDICATIONS:  Current Outpatient Medications  Medication Sig Dispense Refill  . budesonide-formoterol (SYMBICORT) 80-4.5 MCG/ACT inhaler Inhale 2 puffs into the lungs 2 (two) times daily. 3 Inhaler 3  . cholecalciferol (VITAMIN D) 1000 units tablet Take 1,000 Units by mouth daily.    . feeding supplement (BOOST / RESOURCE BREEZE) LIQD Take 1 Container by mouth 3 (three) times daily between meals. 30 Container 0  . gabapentin (NEURONTIN) 400 MG capsule TAKE 1 CAPSULE TWICE DAILY (Patient taking differently: Take 400 mg by mouth 2 (two) times daily. ) 180 capsule 4  . HYDROcodone-acetaminophen (NORCO) 7.5-325 MG tablet Take 1-2 tablets by mouth every 6 (six) hours as needed for moderate pain. 120 tablet 0  . ibuprofen (ADVIL,MOTRIN) 200 MG tablet Take 400 mg by mouth daily as needed for headache or moderate pain.    . meloxicam (MOBIC) 15 MG tablet TAKE 1 TABLET EVERY DAY AS NEEDED 90 tablet 3  . montelukast (SINGULAIR) 10 MG tablet Take 1 tablet (10 mg total) by mouth daily. 90 tablet 1  . omeprazole (PRILOSEC) 20 MG capsule TAKE 1 CAPSULE EVERY DAY (Patient taking differently: Take 20 mg by mouth daily. ) 90 capsule 4  . ondansetron (ZOFRAN) 4 MG tablet Take 0.5-1 tablets (2-4 mg total) by mouth every 8 (eight) hours as needed for nausea or vomiting. 20 tablet 0  . pravastatin (PRAVACHOL) 40 MG tablet TAKE 1 TABLET EVERY DAY (Patient taking differently: Take 40 mg by mouth daily. ) 90 tablet 3  . Probiotic Product (PROBIOTIC DAILY PO) Take 1 capsule by mouth daily.     Marland Kitchen pyridOXINE (VITAMIN B-6) 50 MG tablet Take 1 tablet (50 mg total) by mouth daily. 30 tablet 3  . Triamcinolone Acetonide (NASACORT ALLERGY 24HR NA) Place 2 sprays into the nose daily.     . vitamin B-12 (CYANOCOBALAMIN) 1000 MCG tablet Take 1,000 mcg by mouth daily.    . vancomycin (VANCOCIN)  125 MG capsule Take 1 capsule (125 mg total) by mouth 4 (four) times daily. 56 capsule 0   No current facility-administered medications for this visit.    Facility-Administered Medications Ordered in Other Visits  Medication Dose Route Frequency Provider Last Rate Last Dose  . 0.9 %  sodium chloride infusion   Intravenous Continuous Earlie Server, MD   Stopped at 07/22/17  1150  . heparin lock flush 100 unit/mL  500 Units Intravenous Once Earlie Server, MD      . heparin lock flush 100 unit/mL  500 Units Intravenous Once Earlie Server, MD      . heparin lock flush 100 unit/mL  500 Units Intravenous Once Earlie Server, MD      . sodium chloride flush (NS) 0.9 % injection 10 mL  10 mL Intracatheter PRN Earlie Server, MD      . sodium chloride flush (NS) 0.9 % injection 10 mL  10 mL Intravenous PRN Earlie Server, MD   10 mL at 05/29/17 1347    PHYSICAL EXAMINATION:  ECOG PERFORMANCE STATUS: 1 - Symptomatic but completely ambulatory  Vitals:   08/21/18 1020  BP: 110/75  Pulse: (!) 108  Resp: 18  Temp: 98 F (36.7 C)   Filed Weights   08/21/18 1020  Weight: 154 lb 14.4 oz (70.3 kg)    Physical Exam  Constitutional: She is oriented to person, place, and time. No distress.  HENT:  Head: Normocephalic and atraumatic.  Mouth/Throat: No oropharyngeal exudate.  Eyes: Pupils are equal, round, and reactive to light. Conjunctivae and EOM are normal. Left eye exhibits no discharge. No scleral icterus.  Neck: Normal range of motion. Neck supple.  Cardiovascular: Normal rate, regular rhythm and normal heart sounds.  No murmur heard. Pulmonary/Chest: Effort normal. No respiratory distress. She has no wheezes. She has no rales. She exhibits no tenderness.  Abdominal: Soft. She exhibits no distension. There is no tenderness.  Hyperactive bowel sounds  Musculoskeletal: Normal range of motion. She exhibits no edema.  Lymphadenopathy:    She has no cervical adenopathy.  Neurological: She is alert and oriented to person,  place, and time. No cranial nerve deficit. Gait normal.  Skin: Skin is warm and dry. She is not diaphoretic. No erythema.  Psychiatric: Affect and judgment normal.      LABORATORY DATA: I have personally reviewed the data as listed: CBC Latest Ref Rng & Units 08/01/2018 07/30/2018 07/10/2018  WBC 4.0 - 10.5 K/uL 13.2(H) 27.0(H) 17.5(H)  Hemoglobin 12.0 - 15.0 g/dL 10.2(L) 12.8 10.1(L)  Hematocrit 36.0 - 46.0 % 32.3(L) 38.5 32.2(L)  Platelets 150 - 400 K/uL 372 506(H) 861(H)   CMP Latest Ref Rng & Units 07/30/2018 07/09/2018 07/08/2018  Glucose 70 - 99 mg/dL 168(H) 129(H) 137(H)  BUN 8 - 23 mg/dL '20 17 16  ' Creatinine 0.44 - 1.00 mg/dL 0.70 0.43(L) 0.49  Sodium 135 - 145 mmol/L 130(L) 136 133(L)  Potassium 3.5 - 5.1 mmol/L 3.9 4.2 4.1  Chloride 98 - 111 mmol/L 95(L) 100 96(L)  CO2 22 - 32 mmol/L '24 27 26  ' Calcium 8.9 - 10.3 mg/dL 8.5(L) 8.1(L) 7.9(L)  Total Protein 6.5 - 8.1 g/dL 7.5 6.6 -  Total Bilirubin 0.3 - 1.2 mg/dL 0.6 0.3 -  Alkaline Phos 38 - 126 U/L 82 132(H) -  AST 15 - 41 U/L 20 11(L) -  ALT 0 - 44 U/L 11 7 -    RADIOGRAPHIC STUDIES: I have personally reviewed the radiological images as listed and agree with the findings in the report 04/08/2017 CT abdomen pelvis w contrast IMPRESSION: 1. Partial obstruction at the level of the transverse colon, likely secondary to carcinoma. 2. Ileocolic mesenteric adenopathy, suspicious for nodal metastasis. 3.  Aortic Atherosclerosis (ICD10-I70.0). 4. Bilateral adrenal adenomas. 04/21/2017 CT abdomen pelvis with contrast. IMPRESSION: Postsurgical changes consistent with the given clinical history. A small amount of fluid in a year is  noted surrounding the right lower quadrant ostomy likely felt to be postoperative in nature. No definitive abscess is seen. The patient's palpable abnormality likely corresponds to the small fluid collection.  PATHOLOGY DIAGNOSIS:  A. COLON MASS, TRANSVERSE; TRANSVERSE COLECTOMY:  - INVASIVE  ADENOCARCINOMA, MODERATELY DIFFERENTIATED.  - THREE OF SIXTEEN LYMPH NODES INVOLVED BY METASTASIS (3/16).  - LYMPHOVASCULAR AND PERINEURAL INVASION PRESENT.  - SEE SUMMARY BELOW.  Surgical Pathology Cancer Case Summary  COLON AND RECTUM:  Procedure: transverse colectomy  Tumor Site:  transverse colon  Tumor Size: Greatest dimension: 2.3 cm  Macroscopic Tumor Perforation: not specified  Histologic Type: adenocarcinoma  Histologic Grade: G2, moderately differentiated  Tumor Extension: tumor invades the visceral peritoneum  Margins: all margins are uninvolved by invasive carcinoma, high-grade dysplasia, intramucosal adenocarcinoma, and adenoma  Treatment Effect: no known presurgical therapy  Lymphovascular Invasion: present  Perineural Invasion: present  Tumor Deposits: not identified  Regional Lymph Nodes: # examined: 16  # involved: 3  Pathologic Stage Classification (pTNM, AJCC 8th Edition): pT4a pN1b   ADDENDUM:   MICROSATELLITE INSTABILITY IMMUNOHISTOCHEMISTRY  MISMATCH REPAIR PROTEINS:   MLH1: Intact nuclear expression  MSH2: Intact nuclear expression  MSH6: Intact nuclear expression  PMS2: Intact nuclear expression   Interpretation: No loss of nuclear expression of mismatch repair  proteins: Low probability of MSI-H.    RADIOGRAPHIC STUDIES: I have personally reviewed the radiological images as listed and agreed with the findings in the report. 1. Previous collection of gas and fluid along the right anterior abdominal wall subcutaneous tissues and superficial surface of the rectus abdominus has resolved, with some mild residual scarring in this vicinity. 2. Right ileostomy with peristomal hernia containing small bowel loops but without complicating feature. Left pouch colostomy noted. 3. No findings of active malignancy in the chest, abdomen, or pelvis. 4. Airway plugging in the lower lobes and left upper lobe, with mild atelectasis in the lower lobes.  Bilateral airway thickening potentially from bronchitis. 5. Subacute healing fracture the right anterior fourth rib with questionable nondisplaced right anterior fifth rib fracture. 6. Other imaging findings of potential clinical significance: Aortic Atherosclerosis (ICD10-I70.0). Coronary atherosclerosis. Emphysema (ICD10-J43.9). Bilateral adrenal adenomas.   ASSESSMENT/PLAN 72yo female who has MSI  stable stage IIIB Colon Cancer finishing adjuvant 5-FU   Cancer Staging Malignant neoplasm of transverse colon [facility]) Staging form: Colon and Rectum, AJCC 8th Edition - Clinical stage from 04/15/2017: Stage IIIB (cT4a, cN1b, cM0) - Signed by Earlie Server, MD on 04/26/2017  1. Diarrhea of infectious origin   2. Port-A-Cath in place   3. Malignant neoplasm of transverse colon ([place])    #Stage III colon cancer, S/p Adjuvant chemotherapy and RT.  Had CT abdomen pelvis with contrast on 07/30/2018. Imaging was independently reviewed by me Patient has wall thickening involving the descending and rectosigmoid colon likely due to acute colitis.  Few persistent mildly dilated small bowel loops over the right mid to upper abdomen with interval improvement.  Postsurgical change compatible with previous partial colectomy.  Stable bilateral adrenal mass. I will hold checking patient's tumor marker today due to most likely recurrence of her colitis.  #Diarrhea, likely due to recurrence of C. difficile colitis.  I offered to check her stool here at the cancer center.  Patient tells me that she has lab appointment at Dr. Maralyn Sago office today to get a stool test done.  She most likely need another course of antibiotics.  Encourage patient to increase oral hydration including Pedialyte or Gatorade to prevent electrolyte abnormality  and dehydration.  She voices understanding.  # Persistent neutrophilia and monocytosis, BCR ABL. FISH of peripheral blood came back negative. Flow cytometry showed absolute monocytosis  with phenotypic aberrancy.  Continue to monitor for now.  # Grade 2 neuropathy: Pre-existing peripheral neuropathy, stable  She will come back to clinic in 4 weeks for reevaluation.  If colitis symptoms are resolved we will proceed with surveillance labs.  Patient knows to call if any questions or concerns.  Total face to face encounter time for this patient visit was 25 min. >50% of the time was  spent in counseling and coordination of care.   Earlie Server, MD, PhD Hematology Oncology [facility] at [facility] Pager- [phone] 08/23/18

## 2018-08-24 ENCOUNTER — Telehealth: Payer: Self-pay

## 2018-08-24 IMAGING — CR DG ABDOMEN 2V
1 series · 3 of 3 positions shown · non-contrast
Comparison: 01/02/2017

CLINICAL DATA: Followup abdominal radiograph, possible ileus versus
small-bowel obstruction, abdominal pain, had bowel movement this
morning

EXAM:
ABDOMEN - 2 VIEW

[Series 1: dg abd 2 views · 0.14mm/px · 3 of 3 slices shown]
[im 1/3]
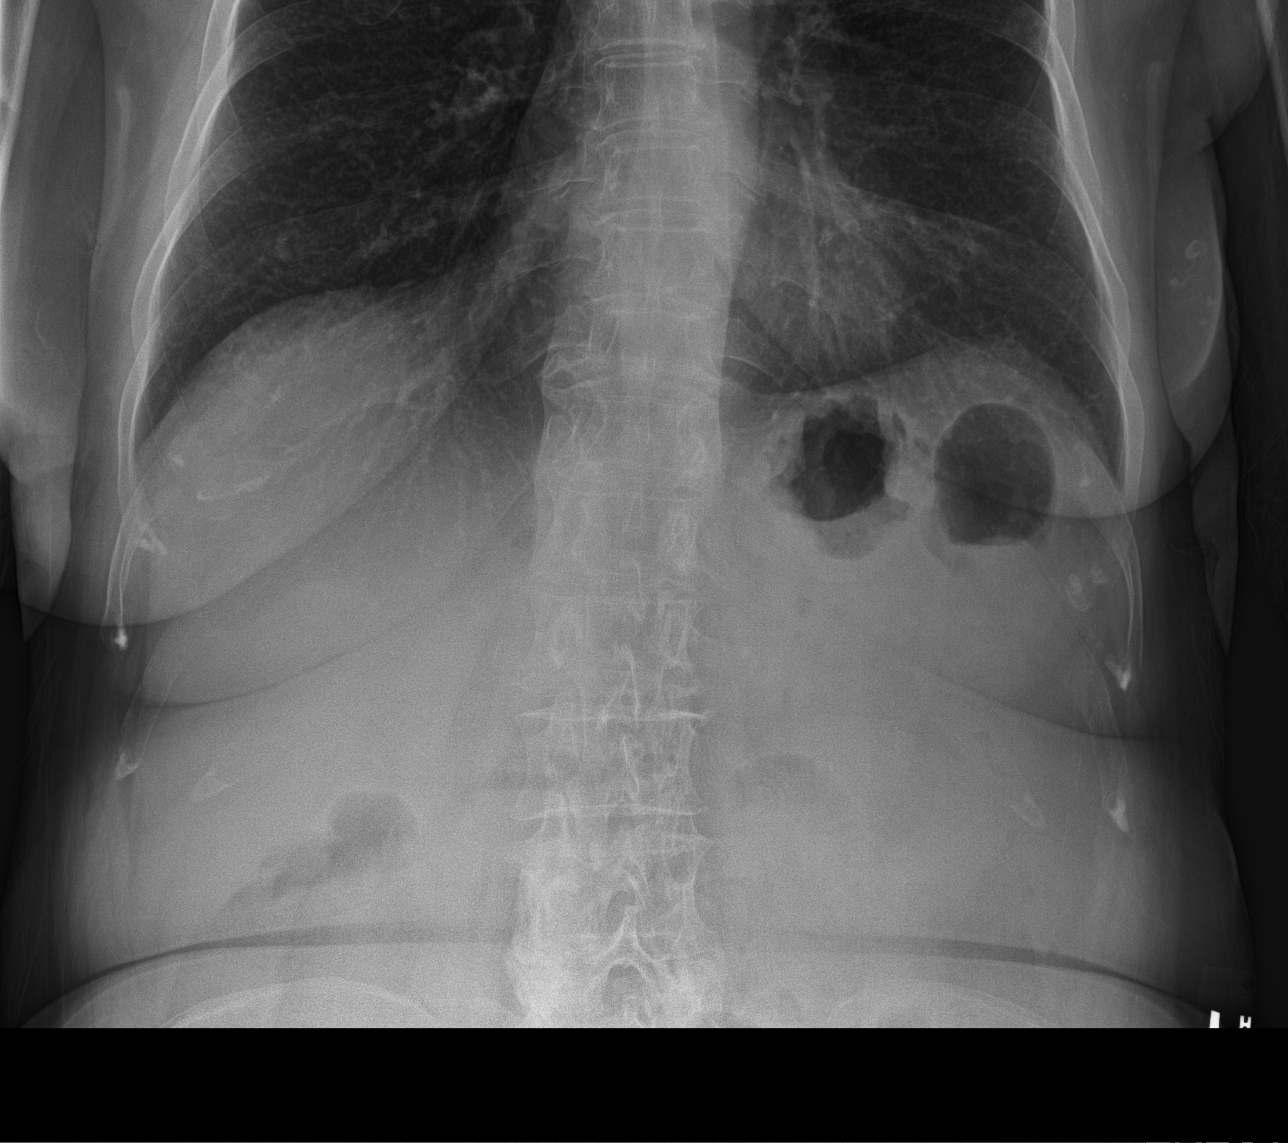
[im 2/3]
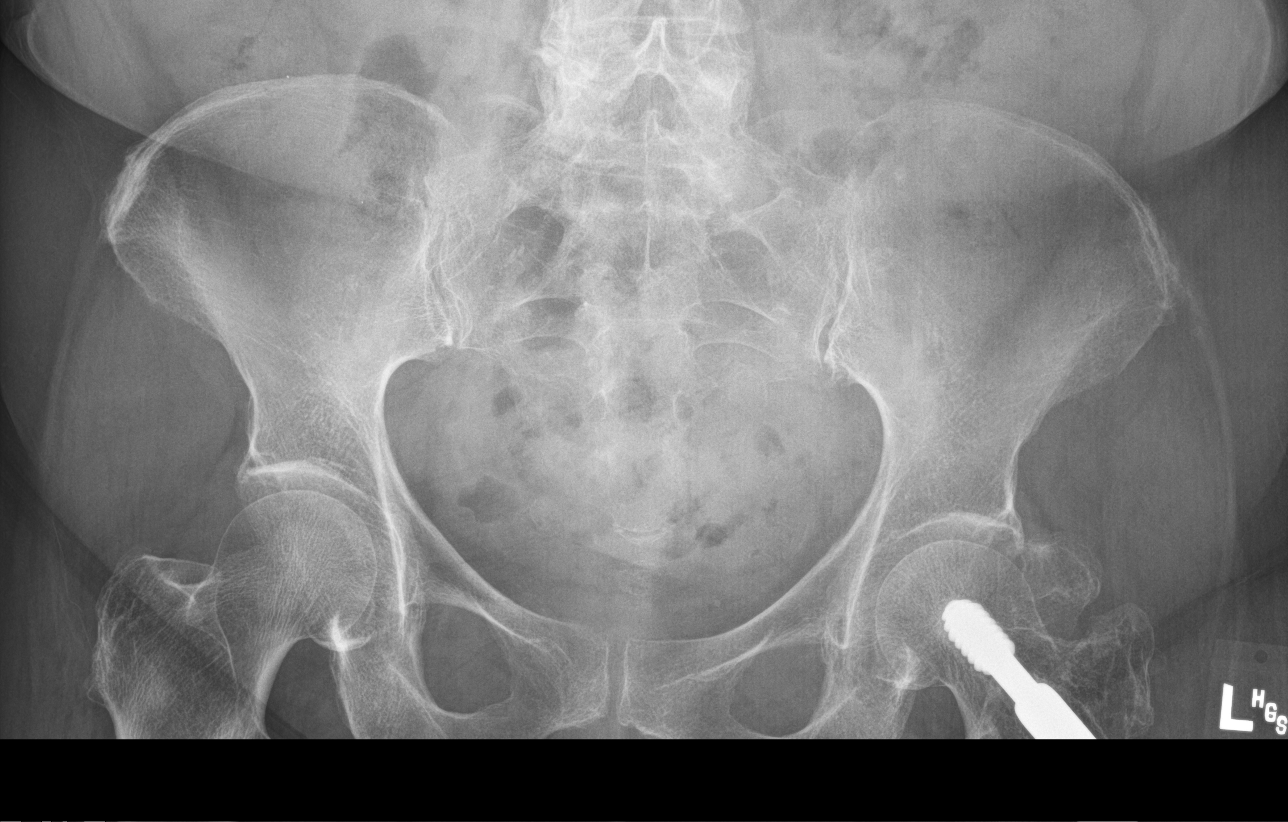
[im 3/3]
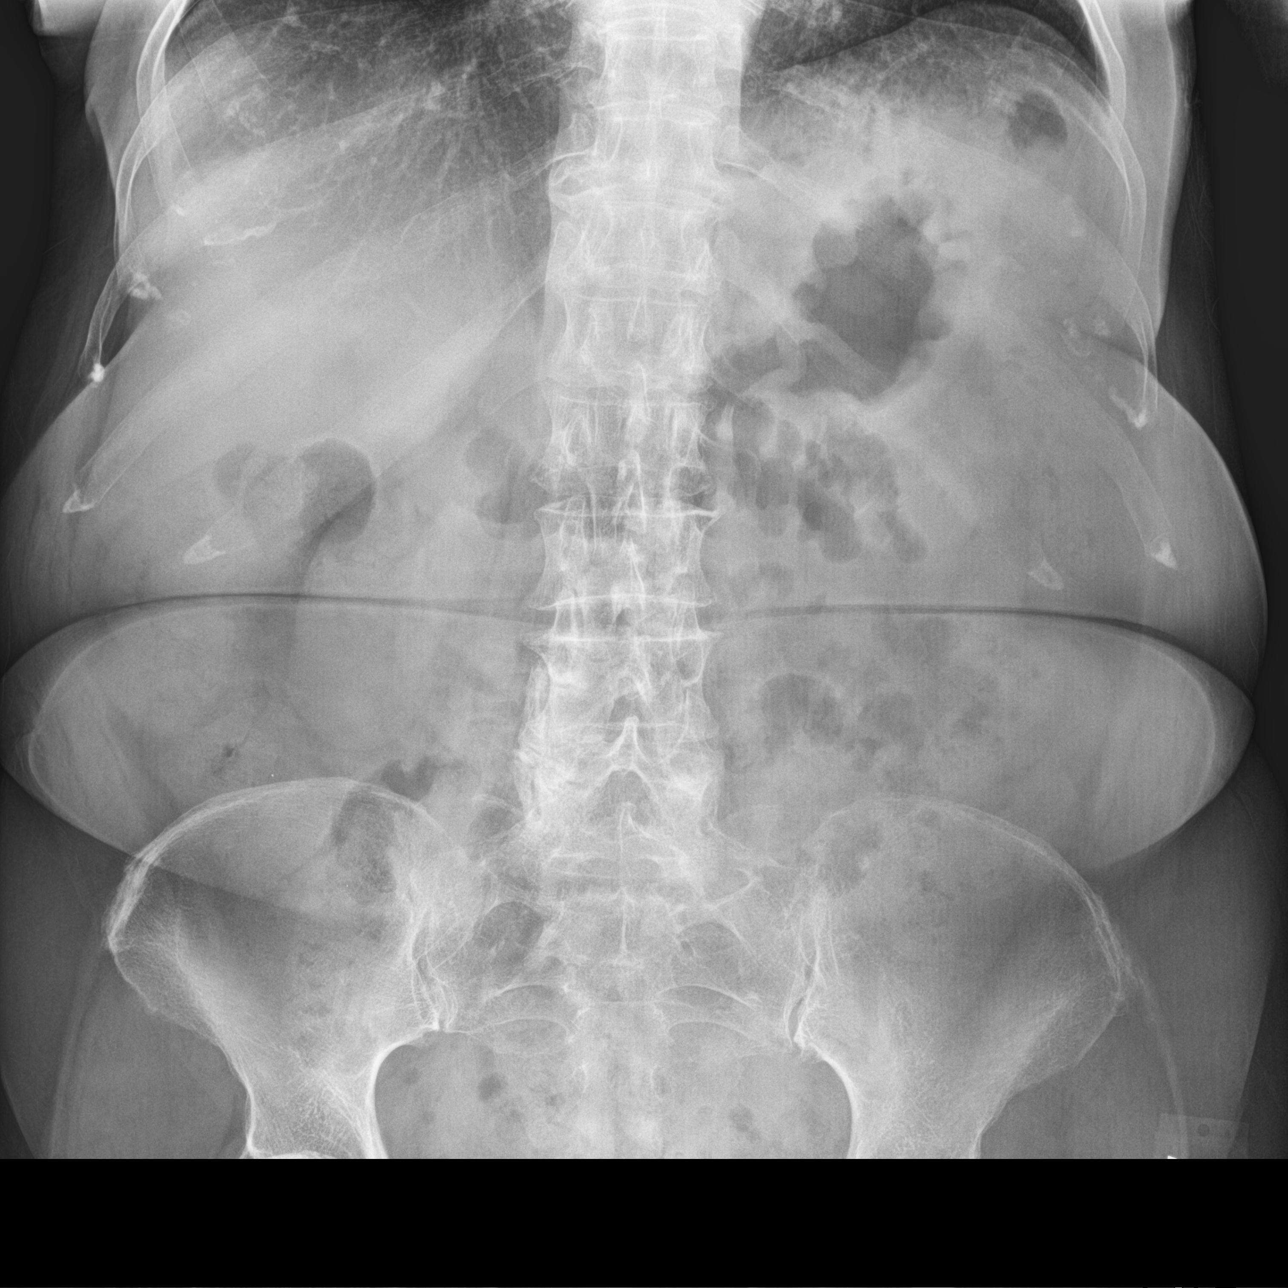

[3 of 3 positions shown; findings below may reference images not displayed]

FINDINGS: Large and small bowel gas pattern normal.

No bowel dilatation or bowel wall thickening.

Question mild thickening of rugal folds at the stomach versus
artifact from underdistention.

Bones diffusely demineralized with degenerative disc and facet
disease changes at lower lumbar spine.

No urinary tract calcification.
IMPRESSION: Large and small bowel gas pattern unremarkable.

Question mild thickening of rugal folds at the stomach though this
could represent an artifact from underdistention.

## 2018-08-24 MED ORDER — HYDROCODONE-ACETAMINOPHEN 7.5-325 MG PO TABS: 1.0000 | ORAL_TABLET | Freq: Four times a day (QID) | ORAL | 0 refills | Status: DC | PRN

## 2018-08-24 NOTE — Telephone Encounter (Signed)
-----   Message from Birdie Sons, MD sent at 08/23/2018  9:40 AM EST ----- Patient's c diff was positive. mychart message was sent to her and [facility] Please make sure she got started on the antibiotic.

## 2018-08-24 NOTE — Telephone Encounter (Signed)
Patient advised as below.  

## 2018-08-27 LAB — C DIFFICILE, CYTOTOXIN B

## 2018-08-27 LAB — CLOSTRIDIUM DIFFICILE BY PCR: CDIFFPCR: POSITIVE — AB

## 2018-08-27 LAB — C DIFFICILE TOXINS A+B W/RFLX: C difficile Toxins A+B, EIA: NEGATIVE

## 2018-09-01 ENCOUNTER — Encounter: Payer: Self-pay | Admitting: Family Medicine

## 2018-09-01 ENCOUNTER — Encounter: Payer: Self-pay | Admitting: Intensive Care

## 2018-09-01 ENCOUNTER — Other Ambulatory Visit: Payer: Self-pay

## 2018-09-01 ENCOUNTER — Emergency Department: Payer: Medicare HMO

## 2018-09-01 ENCOUNTER — Inpatient Hospital Stay
Admission: EM | Admit: 2018-09-01 | Discharge: 2018-09-04 | DRG: 863 | Disposition: A | Discharge status: Home Health | Payer: Medicare HMO | Attending: Surgery | Admitting: Surgery

## 2018-09-01 ENCOUNTER — Ambulatory Visit (INDEPENDENT_AMBULATORY_CARE_PROVIDER_SITE_OTHER): Payer: Medicare HMO | Admitting: Family Medicine

## 2018-09-01 VITALS — BP 106/60 | HR 118 | Temp 98.7°F | Wt 156.0 lb

## 2018-09-01 DIAGNOSIS — A419 Sepsis, unspecified organism: Secondary | ICD-10-CM

## 2018-09-01 DIAGNOSIS — M810 Age-related osteoporosis without current pathological fracture: Secondary | ICD-10-CM | POA: Diagnosis present

## 2018-09-01 DIAGNOSIS — T8141XA Infection following a procedure, superficial incisional surgical site, initial encounter: Secondary | ICD-10-CM | POA: Diagnosis not present

## 2018-09-01 DIAGNOSIS — R9431 Abnormal electrocardiogram [ECG] [EKG]: Secondary | ICD-10-CM | POA: Diagnosis not present

## 2018-09-01 DIAGNOSIS — J449 Chronic obstructive pulmonary disease, unspecified: Secondary | ICD-10-CM | POA: Diagnosis present

## 2018-09-01 DIAGNOSIS — G8929 Other chronic pain: Secondary | ICD-10-CM | POA: Diagnosis present

## 2018-09-01 DIAGNOSIS — Z79899 Other long term (current) drug therapy: Secondary | ICD-10-CM | POA: Diagnosis not present

## 2018-09-01 DIAGNOSIS — Z87891 Personal history of nicotine dependence: Secondary | ICD-10-CM | POA: Diagnosis not present

## 2018-09-01 DIAGNOSIS — K651 Peritoneal abscess: Secondary | ICD-10-CM

## 2018-09-01 DIAGNOSIS — Z9981 Dependence on supplemental oxygen: Secondary | ICD-10-CM

## 2018-09-01 DIAGNOSIS — Z85038 Personal history of other malignant neoplasm of large intestine: Secondary | ICD-10-CM

## 2018-09-01 DIAGNOSIS — L02211 Cutaneous abscess of abdominal wall: Secondary | ICD-10-CM

## 2018-09-01 DIAGNOSIS — L03311 Cellulitis of abdominal wall: Secondary | ICD-10-CM

## 2018-09-01 DIAGNOSIS — K9189 Other postprocedural complications and disorders of digestive system: Secondary | ICD-10-CM | POA: Diagnosis not present

## 2018-09-01 DIAGNOSIS — R000 Tachycardia, unspecified: Secondary | ICD-10-CM

## 2018-09-01 DIAGNOSIS — A0472 Enterocolitis due to Clostridium difficile, not specified as recurrent: Secondary | ICD-10-CM | POA: Diagnosis present

## 2018-09-01 DIAGNOSIS — I4891 Unspecified atrial fibrillation: Secondary | ICD-10-CM | POA: Diagnosis present

## 2018-09-01 DIAGNOSIS — K219 Gastro-esophageal reflux disease without esophagitis: Secondary | ICD-10-CM | POA: Diagnosis not present

## 2018-09-01 DIAGNOSIS — T8143XA Infection following a procedure, organ and space surgical site, initial encounter: Principal | ICD-10-CM | POA: Diagnosis present

## 2018-09-01 DIAGNOSIS — E785 Hyperlipidemia, unspecified: Secondary | ICD-10-CM | POA: Diagnosis not present

## 2018-09-01 DIAGNOSIS — Z883 Allergy status to other anti-infective agents status: Secondary | ICD-10-CM | POA: Diagnosis not present

## 2018-09-01 DIAGNOSIS — Z853 Personal history of malignant neoplasm of breast: Secondary | ICD-10-CM | POA: Diagnosis not present

## 2018-09-01 DIAGNOSIS — J302 Other seasonal allergic rhinitis: Secondary | ICD-10-CM | POA: Diagnosis present

## 2018-09-01 DIAGNOSIS — Y838 Other surgical procedures as the cause of abnormal reaction of the patient, or of later complication, without mention of misadventure at the time of the procedure: Secondary | ICD-10-CM | POA: Diagnosis present

## 2018-09-01 DIAGNOSIS — Z7951 Long term (current) use of inhaled steroids: Secondary | ICD-10-CM

## 2018-09-01 DIAGNOSIS — L0291 Cutaneous abscess, unspecified: Secondary | ICD-10-CM

## 2018-09-01 DIAGNOSIS — Z888 Allergy status to other drugs, medicaments and biological substances status: Secondary | ICD-10-CM

## 2018-09-01 DIAGNOSIS — T8149XA Infection following a procedure, other surgical site, initial encounter: Secondary | ICD-10-CM

## 2018-09-01 DIAGNOSIS — M549 Dorsalgia, unspecified: Secondary | ICD-10-CM | POA: Diagnosis present

## 2018-09-01 DIAGNOSIS — M1990 Unspecified osteoarthritis, unspecified site: Secondary | ICD-10-CM | POA: Diagnosis present

## 2018-09-01 DIAGNOSIS — R195 Other fecal abnormalities: Secondary | ICD-10-CM | POA: Diagnosis not present

## 2018-09-01 HISTORY — DX: Peritoneal abscess: K65.1

## 2018-09-01 HISTORY — DX: Infection following a procedure, organ and space surgical site, initial encounter: T81.43XA

## 2018-09-01 LAB — BASIC METABOLIC PANEL
ANION GAP: 8 (ref 5–15)
BUN: 13 mg/dL (ref 8–23)
CALCIUM: 8.6 mg/dL — AB (ref 8.9–10.3)
CO2: 27 mmol/L (ref 22–32)
Chloride: 99 mmol/L (ref 98–111)
Creatinine, Ser: 0.65 mg/dL (ref 0.44–1.00)
Glucose, Bld: 106 mg/dL — ABNORMAL HIGH (ref 70–99)
Potassium: 4 mmol/L (ref 3.5–5.1)
SODIUM: 134 mmol/L — AB (ref 135–145)

## 2018-09-01 LAB — URINALYSIS, COMPLETE (UACMP) WITH MICROSCOPIC
Bacteria, UA: NONE SEEN
Bilirubin Urine: NEGATIVE
GLUCOSE, UA: NEGATIVE mg/dL
HGB URINE DIPSTICK: NEGATIVE
Ketones, ur: NEGATIVE mg/dL
NITRITE: NEGATIVE
Protein, ur: NEGATIVE mg/dL
Specific Gravity, Urine: 1.01 (ref 1.005–1.030)
pH: 6 (ref 5.0–8.0)

## 2018-09-01 LAB — LACTIC ACID, PLASMA: Lactic Acid, Venous: 0.8 mmol/L (ref 0.5–1.9)

## 2018-09-01 LAB — CBC
HCT: 39.6 % (ref 36.0–46.0)
Hemoglobin: 12.8 g/dL (ref 12.0–15.0)
MCH: 29.2 pg (ref 26.0–34.0)
MCHC: 32.3 g/dL (ref 30.0–36.0)
MCV: 90.4 fL (ref 80.0–100.0)
NRBC: 0 % (ref 0.0–0.2)
PLATELETS: 502 10*3/uL — AB (ref 150–400)
RBC: 4.38 MIL/uL (ref 3.87–5.11)
RDW: 15.5 % (ref 11.5–15.5)
WBC: 18.7 10*3/uL — ABNORMAL HIGH (ref 4.0–10.5)

## 2018-09-01 LAB — HEPATIC FUNCTION PANEL
ALT: 9 U/L (ref 0–44)
AST: 13 U/L — ABNORMAL LOW (ref 15–41)
Albumin: 2.8 g/dL — ABNORMAL LOW (ref 3.5–5.0)
Alkaline Phosphatase: 99 U/L (ref 38–126)
Bilirubin, Direct: <0.1 mg/dL (ref 0.0–0.2)
TOTAL PROTEIN: 6.8 g/dL (ref 6.5–8.1)
Total Bilirubin: 0.4 mg/dL (ref 0.3–1.2)

## 2018-09-01 LAB — LIPASE, BLOOD: LIPASE: 22 U/L (ref 11–51)

## 2018-09-01 LAB — TSH: TSH: 2.854 u[IU]/mL (ref 0.350–4.500)

## 2018-09-01 LAB — T4, FREE: Free T4: 0.91 ng/dL (ref 0.82–1.77)

## 2018-09-01 LAB — TROPONIN I

## 2018-09-01 MED ORDER — VANCOMYCIN HCL IN DEXTROSE 1-5 GM/200ML-% IV SOLN
1000.0000 mg | Freq: Once | INTRAVENOUS | Status: AC
  Administered 2018-09-01: 1000 mg via INTRAVENOUS
  Filled 2018-09-01: qty 200

## 2018-09-01 MED ORDER — OXYCODONE HCL 5 MG PO TABS
ORAL_TABLET | ORAL | Status: AC
  Filled 2018-09-01: qty 1

## 2018-09-01 MED ORDER — PIPERACILLIN-TAZOBACTAM 3.375 G IVPB
3.3750 g | Freq: Three times a day (TID) | INTRAVENOUS | Status: DC
  Administered 2018-09-01 – 2018-09-04 (×8): 3.375 g via INTRAVENOUS
  Filled 2018-09-01 (×9): qty 50

## 2018-09-01 MED ORDER — ENOXAPARIN SODIUM 40 MG/0.4ML ~~LOC~~ SOLN
40.0000 mg | SUBCUTANEOUS | Status: DC
  Administered 2018-09-02 – 2018-09-03 (×2): 40 mg via SUBCUTANEOUS
  Filled 2018-09-01 (×3): qty 0.4

## 2018-09-01 MED ORDER — SODIUM CHLORIDE 0.9 % IV SOLN
2.0000 g | Freq: Once | INTRAVENOUS | Status: AC
  Administered 2018-09-01: 2 g via INTRAVENOUS
  Filled 2018-09-01: qty 2

## 2018-09-01 MED ORDER — VANCOMYCIN 50 MG/ML ORAL SOLUTION
125.0000 mg | Freq: Four times a day (QID) | ORAL | Status: DC
  Administered 2018-09-02 – 2018-09-03 (×4): 125 mg via ORAL
  Filled 2018-09-01 (×9): qty 2.5

## 2018-09-01 MED ORDER — METRONIDAZOLE IN NACL 5-0.79 MG/ML-% IV SOLN
500.0000 mg | Freq: Three times a day (TID) | INTRAVENOUS | Status: DC
  Administered 2018-09-01: 500 mg via INTRAVENOUS
  Filled 2018-09-01 (×4): qty 100

## 2018-09-01 MED ORDER — METOPROLOL TARTRATE 5 MG/5ML IV SOLN: 5.0000 mg | Freq: Four times a day (QID) | INTRAVENOUS | Status: DC | PRN

## 2018-09-01 MED ORDER — OXYCODONE HCL 5 MG PO TABS
5.0000 mg | ORAL_TABLET | Freq: Once | ORAL | Status: AC
  Administered 2018-09-01: 5 mg via ORAL

## 2018-09-01 MED ORDER — DIPHENHYDRAMINE HCL 50 MG/ML IJ SOLN
25.0000 mg | Freq: Once | INTRAMUSCULAR | Status: AC
  Administered 2018-09-01: 25 mg via INTRAVENOUS
  Filled 2018-09-01: qty 1

## 2018-09-01 MED ORDER — ONDANSETRON HCL 4 MG/2ML IJ SOLN: 4.0000 mg | Freq: Four times a day (QID) | INTRAMUSCULAR | Status: DC | PRN

## 2018-09-01 MED ORDER — MOMETASONE FURO-FORMOTEROL FUM 100-5 MCG/ACT IN AERO
2.0000 | INHALATION_SPRAY | Freq: Two times a day (BID) | RESPIRATORY_TRACT | Status: DC
  Administered 2018-09-02 – 2018-09-04 (×4): 2 via RESPIRATORY_TRACT
  Filled 2018-09-01: qty 8.8

## 2018-09-01 MED ORDER — SODIUM CHLORIDE 0.9 % IV BOLUS (SEPSIS)
1000.0000 mL | Freq: Once | INTRAVENOUS | Status: AC
  Administered 2018-09-01: 1000 mL via INTRAVENOUS

## 2018-09-01 MED ORDER — SODIUM CHLORIDE 0.9 % IV SOLN
2.0000 g | Freq: Two times a day (BID) | INTRAVENOUS | Status: DC
  Filled 2018-09-01 (×2): qty 2

## 2018-09-01 MED ORDER — IPRATROPIUM-ALBUTEROL 0.5-2.5 (3) MG/3ML IN SOLN: 3.0000 mL | Freq: Four times a day (QID) | RESPIRATORY_TRACT | Status: DC | PRN

## 2018-09-01 MED ORDER — DEXTROSE IN LACTATED RINGERS 5 % IV SOLN
INTRAVENOUS | Status: DC
  Administered 2018-09-01: 22:00:00 via INTRAVENOUS

## 2018-09-01 MED ORDER — OXYCODONE HCL 5 MG PO TABS
5.0000 mg | ORAL_TABLET | ORAL | Status: DC | PRN
  Administered 2018-09-01 – 2018-09-04 (×6): 10 mg via ORAL
  Filled 2018-09-01 (×7): qty 2

## 2018-09-01 MED ORDER — KETOROLAC TROMETHAMINE 15 MG/ML IJ SOLN: 15.0000 mg | Freq: Four times a day (QID) | INTRAMUSCULAR | Status: DC | PRN

## 2018-09-01 MED ORDER — IOHEXOL 300 MG/ML  SOLN
100.0000 mL | Freq: Once | INTRAMUSCULAR | Status: AC | PRN
  Administered 2018-09-01: 100 mL via INTRAVENOUS

## 2018-09-01 MED ORDER — SODIUM CHLORIDE 0.9 % IV BOLUS (SEPSIS)
250.0000 mL | Freq: Once | INTRAVENOUS | Status: AC
  Administered 2018-09-01: 250 mL via INTRAVENOUS

## 2018-09-01 MED ORDER — ONDANSETRON 4 MG PO TBDP: 4.0000 mg | ORAL_TABLET | Freq: Four times a day (QID) | ORAL | Status: DC | PRN

## 2018-09-01 MED ORDER — PANTOPRAZOLE SODIUM 40 MG IV SOLR
40.0000 mg | Freq: Every day | INTRAVENOUS | Status: DC
  Administered 2018-09-01 – 2018-09-02 (×2): 40 mg via INTRAVENOUS
  Filled 2018-09-01 (×2): qty 40

## 2018-09-01 MED ORDER — ACETAMINOPHEN 500 MG PO TABS
1000.0000 mg | ORAL_TABLET | Freq: Four times a day (QID) | ORAL | Status: DC
  Administered 2018-09-02 – 2018-09-04 (×7): 1000 mg via ORAL
  Filled 2018-09-01 (×9): qty 2

## 2018-09-01 MED ORDER — KETOROLAC TROMETHAMINE 15 MG/ML IJ SOLN
15.0000 mg | Freq: Four times a day (QID) | INTRAMUSCULAR | Status: AC
  Administered 2018-09-01: 15 mg via INTRAVENOUS
  Filled 2018-09-01: qty 1

## 2018-09-01 MED ORDER — VANCOMYCIN HCL 10 G IV SOLR
1500.0000 mg | INTRAVENOUS | Status: DC
  Administered 2018-09-01: 1500 mg via INTRAVENOUS
  Filled 2018-09-01 (×2): qty 1500

## 2018-09-01 NOTE — Consult Note (Addendum)
Pharmacy Antibiotic Note  Kimberly [name] is a 72 y.o. female admitted on 09/01/2018 with right lower quadrant pain after being seen outpatient by family medicine. Patient is actively being treated for recurrent cdiff. Was febrile 12/2 evening. Patient has intra-abdominal abscess; patient had ileostomy removed ~2 months ago. Pharmacy has been consulted for Vancomycin + Cefepime dosing.  Plan: Continue Cefepime 2 g IV q12h. Will order Vancomycin 1500 mg Q24h IV to start @ 2000. Will order trough prior to 4th dose on 12/5 @ 1930. Goal trough 15-20.  Vancomycin Kinetics Using adj BW 62.5 kg and CrCl 63.6 mL/min ke 0.057 Vd 43.8 T1/2 12.2 hrs  Will continue to monitor for antibiotic need as patient has active recurrent cdiff.  Height: 5\' 5"  (165.1 cm) Weight: 156 lb (70.8 kg) IBW/kg (Calculated) : 57  Temp (24hrs), Avg:98.9 F (37.2 C), Min:98.7 F (37.1 C), Max:99 F (37.2 C)  Recent Labs  Lab 09/01/18 1003  WBC 18.7*  CREATININE 0.65    Estimated Creatinine Clearance: 63.6 mL/min (by C-G formula based on SCr of 0.65 mg/dL).    Allergies  Allergen Reactions  . Betadine [Povidone Iodine] Other (See Comments)    Topical iodine she states "if you put it in an open sore it will cause a eating ulcer."  . Other Nausea And Vomiting    Antihystamines  . Sinus Formula [Cholestatin] Nausea Only    Antimicrobials this admission: 12/3 Cefepime >>  12/3 Vancomycin >>  12/3 Flagyl >>  Dose adjustments this admission: N/A  Microbiology results: 12/3 BCx: sent 12/3 UCx: sent    Thank you for allowing pharmacy to be a part of this patient's care.   Paticia Stack, PharmD Pharmacy Resident  09/01/2018 1:11 PM

## 2018-09-01 NOTE — Consult Note (Signed)
[place] at [facility] Consultation  Kimberly [name] HQI:[id number] DOB: [birth year] DOA: 09/01/2018 PCP: Birdie Sons, MD   Requesting physician: Dr. Perrin Maltese Date of consultation: 09/01/18 Reason for consultation: medical manamgent  CHIEF COMPLAINT:   Chief Complaint  Patient presents with  . Tachycardia    HISTORY OF PRESENT ILLNESS: Kimberly [name]  is a 71 y.o. female with a known history of breast cancer, chronic back pain, COPD, GERD, hyperlipidemia who recently has been treated for C. difficile colitis and is still on oral antibiotics who went to see her doctor and was complaining of right lower quadrant plane as well as was noted to have elevated heart rate.  Patient was sent to the emergency room further work-up shows intra-abdominal abscess.  Kimberly [name] has asked [place] to see the patient for medical management.  She currently denies any abdominal pain nausea vomiting or diarrhea.  PAST MEDICAL HISTORY:   Past Medical History:  Diagnosis Date  . Breast cancer, left (HCC)    Lumpectomy and rad tx's.  . Chronic back pain   . Colon cancer ([place])   . COPD (chronic obstructive pulmonary disease) ([place])   . Degenerative disc disease, lumbar 04/2013  . Dyspnea   . Family history of adverse reaction to anesthesia    son arrested after anesthesia  . GERD (gastroesophageal reflux disease)   . Hyperlipidemia   . Iron deficiency anemia 05/27/2017  . Neuropathy   . Osteoarthritis    of knee  . Osteoporosis   . Small bowel obstruction ([place]) 04/16/2017  . Tobacco abuse   . Vitamin D deficiency     PAST SURGICAL HISTORY:  Past Surgical History:  Procedure Laterality Date  . APPENDECTOMY  1965  . BREAST EXCISIONAL BIOPSY Left 08/01/2003   lumpectomy rad 11/04-2/28/2005  . BREAST SURGERY    . CESAREAN SECTION     x3  . COLON SURGERY    . COLONOSCOPY W/ POLYPECTOMY    . COLONOSCOPY WITH PROPOFOL N/A 04/09/2018   Procedure: COLONOSCOPY WITH PROPOFOL;   Surgeon: Virgel Manifold, MD;  Location: [facility];  Service: Gastroenterology;  Laterality: N/A;  . COLOSTOMY TAKEDOWN N/A 06/25/2018   Procedure: COLOSTOMY TAKEDOWN;  Surgeon: Jules Husbands, MD;  Location: [facility];  Service: General;  Laterality: N/A;  . ESOPHAGOGASTRODUODENOSCOPY (EGD) WITH PROPOFOL N/A 04/04/2017   Procedure: ESOPHAGOGASTRODUODENOSCOPY (EGD) WITH PROPOFOL;  Surgeon: [name], MD;  Location: [place];  Service: Endoscopy;  Laterality: N/A;  . FRACTURE SURGERY    . HIP FRACTURE SURGERY Left 01/25/2012  . JOINT REPLACEMENT    . LAPAROTOMY N/A 04/15/2017   Procedure: EXPLORATORY LAPAROTOMY;  Surgeon: Clayburn Pert, MD;  Location: [facility];  Service: General;  Laterality: N/A;  . NECK SURGERY  12/2011  . PARASTOMAL HERNIA REPAIR N/A 12/03/2017   Procedure: HERNIA REPAIR PARASTOMAL;  Surgeon: Jules Husbands, MD;  Location: [facility];  Service: General;  Laterality: N/A;  . PARASTOMAL HERNIA REPAIR N/A 06/25/2018   Procedure: HERNIA REPAIR PARASTOMAL;  Surgeon: Jules Husbands, MD;  Location: [facility];  Service: General;  Laterality: N/A;  . PORTACATH PLACEMENT Right 05/21/2017   Procedure: INSERTION PORT-A-CATH;  Surgeon: Clayburn Pert, MD;  Location: [facility];  Service: General;  Laterality: Right;  . WRIST FRACTURE SURGERY      SOCIAL HISTORY:  Social History   Tobacco Use  . Smoking status: Former Smoker    Packs/day: 0.25    Years: 55.00    Pack  years: 13.75    Types: Cigarettes    Last attempt to quit: 05/21/2017    Years since quitting: 1.2  . Smokeless tobacco: Never Used  . Tobacco comment: started age 49 1/2 to 1 ppd  Substance Use Topics  . Alcohol use: Yes    Alcohol/week: 0.0 standard drinks    Comment: occasionally drinks beer    FAMILY HISTORY:  Family History  Problem Relation Age of Onset  . Diabetes Mother        type 2  . Coronary artery disease Mother   . Deep vein thrombosis Mother   . Colon cancer Father   .  Asthma Brother   . Diabetes Brother        type 2    DRUG ALLERGIES:  Allergies  Allergen Reactions  . Betadine [Povidone Iodine] Other (See Comments)    Topical iodine she states "if you put it in an open sore it will cause a eating ulcer."  . Other Nausea And Vomiting    Antihystamines  . Sinus Formula [Cholestatin] Nausea Only    REVIEW OF SYSTEMS:   CONSTITUTIONAL: No fever, fatigue or weakness.  EYES: No blurred or double vision.  EARS, NOSE, AND THROAT: No tinnitus or ear pain.  RESPIRATORY: No cough, shortness of breath, wheezing or hemoptysis.  CARDIOVASCULAR: No chest pain, orthopnea, edema.  GASTROINTESTINAL: No nausea, vomiting, diarrhea or abdominal pain earlier now resolved GENITOURINARY: No dysuria, hematuria.  ENDOCRINE: No polyuria, nocturia,  HEMATOLOGY: No anemia, easy bruising or bleeding SKIN: No rash or lesion. MUSCULOSKELETAL: No joint pain or arthritis.   NEUROLOGIC: No tingling, numbness, weakness.  PSYCHIATRY: No anxiety or depression.   MEDICATIONS AT HOME:  Prior to Admission medications   Medication Sig Start Date End Date Taking? Authorizing Provider  budesonide-formoterol (SYMBICORT) 80-4.5 MCG/ACT inhaler Inhale 2 puffs into the lungs 2 (two) times daily. 06/09/18  Yes Laverle Hobby, MD  cholecalciferol (VITAMIN D) 1000 units tablet Take 1,000 Units by mouth daily.   Yes [provider]  gabapentin (NEURONTIN) 400 MG capsule TAKE 1 CAPSULE TWICE DAILY Patient taking differently: Take 400 mg by mouth 2 (two) times daily.  09/28/17  Yes Birdie Sons, MD  HYDROcodone-acetaminophen (NORCO) 7.5-325 MG tablet Take 1-2 tablets by mouth every 6 (six) hours as needed for moderate pain. 08/24/18  Yes Birdie Sons, MD  ibuprofen (ADVIL,MOTRIN) 200 MG tablet Take 400 mg by mouth daily as needed for headache or moderate pain.   Yes [provider]  meloxicam (MOBIC) 15 MG tablet TAKE 1 TABLET EVERY DAY AS NEEDED 08/11/18  Yes  Birdie Sons, MD  montelukast (SINGULAIR) 10 MG tablet Take 1 tablet (10 mg total) by mouth daily. 06/09/18  Yes Laverle Hobby, MD  omeprazole (PRILOSEC) 20 MG capsule TAKE 1 CAPSULE EVERY DAY Patient taking differently: Take 20 mg by mouth daily.  02/10/18  Yes Birdie Sons, MD  ondansetron (ZOFRAN) 4 MG tablet Take 0.5-1 tablets (2-4 mg total) by mouth every 8 (eight) hours as needed for nausea or vomiting. 08/19/18  Yes Birdie Sons, MD  pravastatin (PRAVACHOL) 40 MG tablet TAKE 1 TABLET EVERY DAY Patient taking differently: Take 40 mg by mouth daily.  04/27/18  Yes Birdie Sons, MD  Probiotic Product (PROBIOTIC DAILY PO) Take 1 capsule by mouth daily.    Yes [provider]  pyridOXINE (VITAMIN B-6) 50 MG tablet Take 1 tablet (50 mg total) by mouth daily. 06/24/17  Yes Earlie Server, MD  Triamcinolone Acetonide (NASACORT ALLERGY 24HR NA) Place 2 sprays into the nose daily.    Yes [provider]  vancomycin (VANCOCIN) 125 MG capsule Take 1 capsule (125 mg total) by mouth 4 (four) times daily. 08/23/18  Yes Birdie Sons, MD  vitamin B-12 (CYANOCOBALAMIN) 1000 MCG tablet Take 1,000 mcg by mouth daily.   Yes [provider]  feeding supplement (BOOST / RESOURCE BREEZE) LIQD Take 1 Container by mouth 3 (three) times daily between meals. Patient not taking: Reported on 09/01/2018 04/19/17   Fritzi Mandes, MD      PHYSICAL EXAMINATION:   VITAL SIGNS: Blood pressure 105/62, pulse 83, temperature 99 F (37.2 C), resp. rate 16, height 5\' 5"  (1.651 m), weight 70.8 kg, SpO2 94 %.  GENERAL:  72 y.o.-year-old patient lying in the bed with no acute distress.  EYES: Pupils equal, round, reactive to light and accommodation. No scleral icterus. Extraocular muscles intact.  HEENT: Head atraumatic, normocephalic. Oropharynx and nasopharynx clear.  NECK:  Supple, no jugular venous distention. No thyroid enlargement, no tenderness.  LUNGS: Normal breath sounds  bilaterally, no wheezing, rales,rhonchi or crepitation. No use of accessory muscles of respiration.  CARDIOVASCULAR: S1, S2 normal. No murmurs, rubs, or gallops.  ABDOMEN: Soft, nontender, nondistended. Bowel sounds present. No organomegaly or mass.  EXTREMITIES: No pedal edema, cyanosis, or clubbing.  NEUROLOGIC: Cranial nerves II through XII are intact. Muscle strength 5/5 in all extremities. Sensation intact. Gait not checked.  PSYCHIATRIC: The patient is alert and oriented x 3.  SKIN: No obvious rash, lesion, or ulcer.   LABORATORY PANEL:   CBC Recent Labs  Lab 09/01/18 1003  WBC 18.7*  HGB 12.8  HCT 39.6  PLT 502*  MCV 90.4  MCH 29.2  MCHC 32.3  RDW 15.5   ------------------------------------------------------------------------------------------------------------------  Chemistries  Recent Labs  Lab 09/01/18 1003  NA 134*  K 4.0  CL 99  CO2 27  GLUCOSE 106*  BUN 13  CREATININE 0.65  CALCIUM 8.6*  AST 13*  ALT 9  ALKPHOS 99  BILITOT 0.4   ------------------------------------------------------------------------------------------------------------------ estimated creatinine clearance is 63.6 mL/min (by C-G formula based on SCr of 0.65 mg/dL). ------------------------------------------------------------------------------------------------------------------ Recent Labs    09/01/18 1003  TSH 2.854     Coagulation profile No results for input(s): INR, PROTIME in the last 168 hours. ------------------------------------------------------------------------------------------------------------------- No results for input(s): DDIMER in the last 72 hours. -------------------------------------------------------------------------------------------------------------------  Cardiac Enzymes Recent Labs  Lab 09/01/18 1003  TROPONINI <0.03   ------------------------------------------------------------------------------------------------------------------ Invalid  input(s): POCBNP  ---------------------------------------------------------------------------------------------------------------  Urinalysis    Component Value Date/Time   COLORURINE YELLOW (A) 09/01/2018 1115   APPEARANCEUR CLOUDY (A) 09/01/2018 1115   APPEARANCEUR Hazy 01/26/2012 0108   LABSPEC 1.010 09/01/2018 1115   LABSPEC 1.021 01/26/2012 0108   PHURINE 6.0 09/01/2018 1115   GLUCOSEU NEGATIVE 09/01/2018 1115   GLUCOSEU Negative 01/26/2012 0108   HGBUR NEGATIVE 09/01/2018 1115   BILIRUBINUR NEGATIVE 09/01/2018 1115   BILIRUBINUR Negative 01/26/2012 0108   KETONESUR NEGATIVE 09/01/2018 1115   PROTEINUR NEGATIVE 09/01/2018 1115   NITRITE NEGATIVE 09/01/2018 1115   LEUKOCYTESUR TRACE (A) 09/01/2018 1115   LEUKOCYTESUR Negative 01/26/2012 0108     RADIOLOGY: Dg Chest 2 View  Result Date: 09/01/2018 CLINICAL DATA:  Tachycardia. EXAM: CHEST - 2 VIEW COMPARISON:  Radiograph of July 06, 2018. FINDINGS: The heart size and mediastinal contours are within normal limits. Both lungs are clear. No pneumothorax or pleural effusion is noted. The visualized skeletal structures are unremarkable. IMPRESSION: No active  cardiopulmonary disease. Electronically Signed   By: Marijo Conception, M.D.   On: 09/01/2018 10:57   Ct Abdomen Pelvis W Contrast  Result Date: 09/01/2018 CLINICAL DATA:  Initial evaluation for acute generalized abdominal pain. EXAM: CT ABDOMEN AND PELVIS WITH CONTRAST TECHNIQUE: Multidetector CT imaging of the abdomen and pelvis was performed using the standard protocol following bolus administration of intravenous contrast. CONTRAST:  163mL OMNIPAQUE IOHEXOL 300 MG/ML  SOLN COMPARISON:  Comparison made with prior CT from 07/30/2018. FINDINGS: Lower chest: Scattered subsegmental atelectatic changes seen dependently within visualized lung bases. Visualized lungs are otherwise clear. Hepatobiliary: Liver demonstrates a normal contrast enhanced appearance. Gallbladder within normal  limits. No biliary dilatation. Pancreas: Pancreas within normal limits. Spleen: Spleen within normal limits. Adrenals/Urinary Tract: Bilateral adrenal masses measuring 5.7 cm on the left and 2.6 cm on the right, stable. Kidneys equal in size with symmetric enhancement. No nephrolithiasis, hydronephrosis, or focal enhancing renal mass. No hydroureter. Partially distended bladder within normal limits. Stomach/Bowel: Stomach within normal limits. No evidence for bowel obstruction. Postoperative changes from prior partial colectomy with recent colostomy takedown and parastomal hernia repair within the right lower quadrant. Fat stranding within the anterior right lower quadrant and along the undersurface of the right abdominal wall just inferior to the surgical anastomosis, mildly worsened from previous. There are a few small foci of extraluminal gas within this region, consistent with free air (series 2, image 56, 57). Hazy inflammatory stranding within the overlying right abdominal wall. Irregular loculated collection within the overlying subcutaneous fat measures 3.3 x 2.9 x 4.2 cm (series 2, image 62). Surrounding fat stranding. This has increased from previous. No other intra-abdominal collections. No other acute inflammatory changes about the bowels. Mild colonic diverticulosis without evidence for acute diverticulitis. Moderate retained stool within the colon, which could reflect constipation. Vascular/Lymphatic: In veins aorto bi-iliac atherosclerotic disease. No aneurysm. Mesenteric vessels patent proximally. No pathologically enlarged intra-abdominopelvic lymph nodes. Reproductive: Uterus and ovaries within normal limits. Other: No other significant free intraperitoneal air. No free fluid. Diastasis of the rectus abdominis musculature with additional fat containing paraumbilical hernia noted. No associated inflammation. Postoperative stranding noted within the subcutaneous fat of the left ventral abdomen as  well. Musculoskeletal: No acute osseous abnormality. No discrete lytic or blastic osseous lesions. Chronic compression deformity involving the T12 vertebral body, stable. IM fixation rod partially visualize within the left femur. IMPRESSION: 1. Increased inflammatory fat stranding with a few small locules of extraluminal free air within the right lower quadrant adjacent to the surgical anastomosis, concerning for possible anastomotic leak. 3.3 x 2.9 x 4.2 cm collection within subcutaneous fat of the overlying right ventral abdominal wall, worsened from previous. 2. No evidence for bowel obstruction status post partial colectomy and recent colostomy takedown. 3. Moderate retained stool within the residual colon, suggesting constipation. 4. Unchanged bilateral adrenal masses. 5. Advanced aorto bi-iliac atherosclerotic disease. Critical Value/emergent results were called by telephone at the time of interpretation on 09/01/2018 at 1:38 pm to Dr. Carrie Mew , who verbally acknowledged these results. Electronically Signed   By: Jeannine Boga M.D.   On: 09/01/2018 13:41    EKG: Orders placed or performed during the hospital encounter of 09/01/18  . ED EKG within 10 minutes  . ED EKG within 10 minutes  . EKG 12-Lead  . EKG 12-Lead    IMPRESSION AND PLAN: Patient is a 72 year old being admitted by surgery for abdominal pain and intra-abdominal abscess  1.  COPD we will continue her inhalers  add nebulizers as needed  2.  GERD we will place patient on IV Pepcid for now  3.  Seasonal allergies hold Singulair for now can resume once able to take oral medication  4.  Hyperlipidemia hold Pravachol for now resume when able to take oral medication  5.  Recent history of C. difficile colitis diarrhea is resolved on IV Flagyl for now once improved can switch her back to oral vancomycin  6.  Miscellaneous recommend DVT prophylaxis with either heparin or Lovenox   All the records are reviewed and  case discussed with ED provider. Management plans discussed with the patient, family and they are in agreement.  CODE STATUS: Code Status History    Date Active Date Inactive Code Status Order ID Comments User Context   07/31/2018 0444 08/01/2018 1903 Full Code [id number]  Harrie Foreman, MD Inpatient   06/25/2018 2009 07/13/2018 1848 Full Code [id number]  Jules Husbands, MD Inpatient   12/01/2017 1437 12/05/2017 1955 Full Code [id number]  Nestor Lewandowsky, MD Inpatient   04/15/2017 0317 04/19/2017 1822 Full Code [id number]  Harrie Foreman, MD Inpatient       TOTAL TIME TAKING CARE OF THIS PATIENT: 55 minutes.    Dustin Flock M.D on 09/01/2018 at 4:04 PM  Between 7am to 6pm - Pager - [phone]  After 6pm go to [email or web address] - [facility] MD Office  [phone]  CC: Primary care physician; Birdie Sons, MD

## 2018-09-01 NOTE — ED Triage Notes (Signed)
Patient was sent here by DR. Fisher this morning due to tachycardia. Patient was at appointment to get CT scan this AM of abdomen for abscess but did not get to complete due to heart rate. C/o pain in lower abdomen X1 day

## 2018-09-01 NOTE — ED Notes (Signed)
Redness noted to right arm. Patient endorses pain. "I think its from the blood pressure cuff". BP switched to other arm. Red redness extends from forearm to elbow. Vanc paused. Joni Fears MD notified. See orders. Per verbal order from [place] MD give benadryl and restart vanc at half of the original rate.

## 2018-09-01 NOTE — ED Notes (Signed)
Pt is resting in bed with eyes closed. VSS. NAD. Call light within reach. Will continue to monitor for changes.

## 2018-09-01 NOTE — Progress Notes (Signed)
Patient ID: Kimberly [name], female   DOB: 1946/02/08, 72 y.o.   MRN: [id number] I have reviewed the imaging for purposes of percutaneous treatment. Mrs. Lycan has inflammatory changes involving bowel anastomosis in the right lower quadrant.  There are a few bubbles of free intraperitoneal gas and inflammatory changes in the adjacent intraperitoneal fat.  There is also an overlying superficial ill-defined fluid collection.  Aspiration versus superficial drain placement can be offered.  The patient will be placed n.p.o. after midnight and the procedure will be performed tomorrow.

## 2018-09-01 NOTE — ED Notes (Signed)
Pt taken up to floor via stretcher. VSS. NAD. Report called to [facility] on [facility]

## 2018-09-01 NOTE — Progress Notes (Signed)
Patient: Kimberly [name] Female    DOB: December 04, 1945   72 y.o.   MRN: [id number] Visit Date: 09/01/2018  Today's Provider: Lelon Huh, MD   Chief Complaint  Patient presents with  . Abdominal Pain    Started yesterday pt also noticed a "knot" on her lower right abdomen.    Subjective:    Abdominal Pain  This is a new problem. The current episode started yesterday. The problem occurs constantly. The pain is located in the RLQ. The quality of the pain is sharp and aching. The abdominal pain does not radiate. Associated symptoms include a fever (Temp was 101 last night). Pertinent negatives include no arthralgias, constipation, diarrhea, dysuria, frequency, headaches, nausea, vomiting or weight loss. Nothing aggravates the pain. The pain is relieved by nothing. She has tried oral narcotic analgesics for the symptoms. The treatment provided mild relief. Her past medical history is significant for abdominal surgery and colon cancer.  She is currently on her second week of vancomycin for recurrent c. Diff and is not currently having any diarrhea or generalized abdominal pain or cramping.   She is noted to be tachycardic at the time of check-in     Allergies  Allergen Reactions  . Betadine [Povidone Iodine] Other (See Comments)    Topical iodine she states "if you put it in an open sore it will cause a eating ulcer."  . Other Nausea And Vomiting    Antihystamines  . Sinus Formula [Cholestatin] Nausea Only     Current Outpatient Medications:  .  budesonide-formoterol (SYMBICORT) 80-4.5 MCG/ACT inhaler, Inhale 2 puffs into the lungs 2 (two) times daily., Disp: 3 Inhaler, Rfl: 3 .  cholecalciferol (VITAMIN D) 1000 units tablet, Take 1,000 Units by mouth daily., Disp: , Rfl:  .  feeding supplement (BOOST / RESOURCE BREEZE) LIQD, Take 1 Container by mouth 3 (three) times daily between meals., Disp: 30 Container, Rfl: 0 .  gabapentin (NEURONTIN) 400 MG capsule, TAKE 1 CAPSULE TWICE DAILY  (Patient taking differently: Take 400 mg by mouth 2 (two) times daily. ), Disp: 180 capsule, Rfl: 4 .  HYDROcodone-acetaminophen (NORCO) 7.5-325 MG tablet, Take 1-2 tablets by mouth every 6 (six) hours as needed for moderate pain., Disp: 120 tablet, Rfl: 0 .  ibuprofen (ADVIL,MOTRIN) 200 MG tablet, Take 400 mg by mouth daily as needed for headache or moderate pain., Disp: , Rfl:  .  meloxicam (MOBIC) 15 MG tablet, TAKE 1 TABLET EVERY DAY AS NEEDED, Disp: 90 tablet, Rfl: 3 .  montelukast (SINGULAIR) 10 MG tablet, Take 1 tablet (10 mg total) by mouth daily., Disp: 90 tablet, Rfl: 1 .  omeprazole (PRILOSEC) 20 MG capsule, TAKE 1 CAPSULE EVERY DAY (Patient taking differently: Take 20 mg by mouth daily. ), Disp: 90 capsule, Rfl: 4 .  ondansetron (ZOFRAN) 4 MG tablet, Take 0.5-1 tablets (2-4 mg total) by mouth every 8 (eight) hours as needed for nausea or vomiting., Disp: 20 tablet, Rfl: 0 .  pravastatin (PRAVACHOL) 40 MG tablet, TAKE 1 TABLET EVERY DAY (Patient taking differently: Take 40 mg by mouth daily. ), Disp: 90 tablet, Rfl: 3 .  Probiotic Product (PROBIOTIC DAILY PO), Take 1 capsule by mouth daily. , Disp: , Rfl:  .  pyridOXINE (VITAMIN B-6) 50 MG tablet, Take 1 tablet (50 mg total) by mouth daily., Disp: 30 tablet, Rfl: 3 .  Triamcinolone Acetonide (NASACORT ALLERGY 24HR NA), Place 2 sprays into the nose daily. , Disp: , Rfl:  .  vancomycin (VANCOCIN) 125 MG capsule, Take 1 capsule (125 mg total) by mouth 4 (four) times daily., Disp: 56 capsule, Rfl: 0 .  vitamin B-12 (CYANOCOBALAMIN) 1000 MCG tablet, Take 1,000 mcg by mouth daily., Disp: , Rfl:  No current facility-administered medications for this visit.   Facility-Administered Medications Ordered in Other Visits:  .  0.9 %  sodium chloride infusion, , Intravenous, Continuous, Earlie Server, MD, Stopped at 07/22/17 1150 .  heparin lock flush 100 unit/mL, 500 Units, Intravenous, Once, Earlie Server, MD .  heparin lock flush 100 unit/mL, 500 Units,  Intravenous, Once, Earlie Server, MD .  heparin lock flush 100 unit/mL, 500 Units, Intravenous, Once, Earlie Server, MD .  sodium chloride flush (NS) 0.9 % injection 10 mL, 10 mL, Intracatheter, PRN, Earlie Server, MD .  sodium chloride flush (NS) 0.9 % injection 10 mL, 10 mL, Intravenous, PRN, Earlie Server, MD, 10 mL at 05/29/17 1347  Review of Systems  Constitutional: Positive for chills, diaphoresis, fatigue and fever (Temp was 101 last night). Negative for activity change, appetite change, unexpected weight change and weight loss.  Respiratory: Negative.   Cardiovascular: Negative.   Gastrointestinal: Positive for abdominal distention (Pt reports having a "knot" on her lower right abdomen.) and abdominal pain. Negative for anal bleeding, blood in stool, constipation, diarrhea, nausea, rectal pain and vomiting.  Genitourinary: Negative.  Negative for dysuria and frequency.  Musculoskeletal: Negative for arthralgias.  Neurological: Negative for dizziness, light-headedness and headaches.    Social History   Tobacco Use  . Smoking status: Former Smoker    Packs/day: 0.25    Years: 55.00    Pack years: 13.75    Types: Cigarettes    Last attempt to quit: 05/21/2017    Years since quitting: 1.2  . Smokeless tobacco: Never Used  . Tobacco comment: started age 54 1/2 to 1 ppd  Substance Use Topics  . Alcohol use: Yes    Alcohol/week: 0.0 standard drinks    Comment: occasionally drinks beer   Objective:   BP 106/60 (BP Location: Right Arm, Patient Position: Sitting, Cuff Size: Normal)   Pulse (!) 118   Temp 98.7 F (37.1 C) (Oral)   Wt 156 lb (70.8 kg)   SpO2 95%   BMI 26.78 kg/m  Vitals:   09/01/18 0906  BP: 106/60  Pulse: (!) 118  Temp: 98.7 F (37.1 C)  TempSrc: Oral  SpO2: 95%  Weight: 156 lb (70.8 kg)     Physical Exam   General Appearance:    Alert, cooperative, no distress  Eyes:    PERRL, conjunctiva/corneas clear, EOM's intact       Lungs:     Clear to auscultation  bilaterally, respirations unlabored  Heart:     Irregularly irregular rhythm. Normal rate   Abdomen:   Erythema and soft tissue swelling and tenderness around RLQ surgical scar.     EKG: atrial fibrillation (new) with diffuse ST depression     Assessment & Plan:     1. Cellulitis of abdominal wall Cannot rule out abdominal wall abscess. She is already on oral vancomycin for c.diff. May need IV antibiotic. Considering new cardiac findings below will send directly to ER for further evaluation and treatment.   2. Tachycardia Due to new onset of atrial fibrillation - EKG 12-Lead  3. Atrial fibrillation, unspecified type ([place]) New onset.   4. ST segment depression Asymptomatic, but patient has multiple cardiac risk factors. Patient sent directly to ER for further evaluation and management.  Lelon Huh, MD  [facility]

## 2018-09-01 NOTE — H&P (Signed)
[facility] Admission Note  Kimberly [name] 1946/03/11  [id number].    Requesting MD: Dr Carrie Mew, MD Chief Complaint/Reason for Consult: Abdominal Abscess  HPI:  Kimberly [name] is 72 y.o. female who presents to [facility] ED this afternoon with complaints of abdominal pain. She reports that on Sunday she had acute onset of sharp RLQ abdominal pain and fever to as high as 101. She went to her PCP this afternoon for this and was also found to be tachycardic and new onset atrial fibrillation on EKG, which was worrisome for infectious process and patient presented to ED for further work up. She denied any chest pain, SOB, palpitations, nausea, or emesis. Of note, this patient is well known to our practice and she is 2 months status post ileostomy takedown. Work up in the ED was concerning for leukocytosis and intra-abdominal abscess.   General surgery is consulted by emergency medicine physician Dr Brenton Grills, MD for evaluation and management of intra-abdominal abscess.   ROS: Review of Systems  Constitutional: Positive for fever. Negative for chills.  Respiratory: Negative for cough and shortness of breath.   Cardiovascular: Negative for chest pain and palpitations.  Gastrointestinal: Positive for abdominal pain. Negative for blood in stool, constipation, diarrhea, nausea and vomiting.  Genitourinary: Negative for dysuria and urgency.  All other systems reviewed and are negative.   Family History  Problem Relation Age of Onset  . Diabetes Mother        type 2  . Coronary artery disease Mother   . Deep vein thrombosis Mother   . Colon cancer Father   . Asthma Brother   . Diabetes Brother        type 2    Past Medical History:  Diagnosis Date  . Breast cancer, left (HCC)    Lumpectomy and rad tx's.  . Chronic back pain   . Colon cancer ([place])   . COPD (chronic obstructive pulmonary disease) ([place])   . Degenerative disc disease, lumbar 04/2013  . Dyspnea   .  Family history of adverse reaction to anesthesia    son arrested after anesthesia  . GERD (gastroesophageal reflux disease)   . Hyperlipidemia   . Iron deficiency anemia 05/27/2017  . Neuropathy   . Osteoarthritis    of knee  . Osteoporosis   . Small bowel obstruction ([place]) 04/16/2017  . Tobacco abuse   . Vitamin D deficiency     Past Surgical History:  Procedure Laterality Date  . APPENDECTOMY  1965  . BREAST EXCISIONAL BIOPSY Left 08/01/2003   lumpectomy rad 11/04-2/28/2005  . BREAST SURGERY    . CESAREAN SECTION     x3  . COLON SURGERY    . COLONOSCOPY W/ POLYPECTOMY    . COLONOSCOPY WITH PROPOFOL N/A 04/09/2018   Procedure: COLONOSCOPY WITH PROPOFOL;  Surgeon: Virgel Manifold, MD;  Location: [facility] ENDOSCOPY;  Service: Gastroenterology;  Laterality: N/A;  . COLOSTOMY TAKEDOWN N/A 06/25/2018   Procedure: COLOSTOMY TAKEDOWN;  Surgeon: Jules Husbands, MD;  Location: [facility] ORS;  Service: General;  Laterality: N/A;  . ESOPHAGOGASTRODUODENOSCOPY (EGD) WITH PROPOFOL N/A 04/04/2017   Procedure: ESOPHAGOGASTRODUODENOSCOPY (EGD) WITH PROPOFOL;  Surgeon: [name], MD;  Location: [place];  Service: Endoscopy;  Laterality: N/A;  . FRACTURE SURGERY    . HIP FRACTURE SURGERY Left 01/25/2012  . JOINT REPLACEMENT    . LAPAROTOMY N/A 04/15/2017   Procedure: EXPLORATORY LAPAROTOMY;  Surgeon: Clayburn Pert, MD;  Location: [facility] ORS;  Service: General;  Laterality: N/A;  . NECK SURGERY  12/2011  . PARASTOMAL HERNIA REPAIR N/A 12/03/2017   Procedure: HERNIA REPAIR PARASTOMAL;  Surgeon: Jules Husbands, MD;  Location: [facility] ORS;  Service: General;  Laterality: N/A;  . PARASTOMAL HERNIA REPAIR N/A 06/25/2018   Procedure: HERNIA REPAIR PARASTOMAL;  Surgeon: Jules Husbands, MD;  Location: [facility] ORS;  Service: General;  Laterality: N/A;  . PORTACATH PLACEMENT Right 05/21/2017   Procedure: INSERTION PORT-A-CATH;  Surgeon: Clayburn Pert, MD;  Location: [facility] ORS;  Service: General;   Laterality: Right;  . WRIST FRACTURE SURGERY      Social History:  reports that she quit smoking about 15 months ago. Her smoking use included cigarettes. She has a 13.75 pack-year smoking history. She has never used smokeless tobacco. She reports that she drinks alcohol. She reports that she does not use drugs.  Allergies:  Allergies  Allergen Reactions  . Betadine [Povidone Iodine] Other (See Comments)    Topical iodine she states "if you put it in an open sore it will cause a eating ulcer."  . Other Nausea And Vomiting    Antihystamines  . Sinus Formula [Cholestatin] Nausea Only     (Not in a hospital admission)  Blood pressure 127/85, pulse 83, temperature 99 F (37.2 C), resp. rate 14, height 5\' 5"  (1.651 m), weight 70.8 kg, SpO2 93 %.   Physical Exam: Physical Exam  Constitutional: She is oriented to person, place, and time. She appears well-developed and well-nourished. She is active. No distress. Nasal cannula in place.  HENT:  Head: Normocephalic and atraumatic.  Eyes: Pupils are equal, round, and reactive to light. No scleral icterus.  Cardiovascular: Normal rate. Exam reveals no gallop and no friction rub.  No murmur heard. Pulmonary/Chest: Effort normal and breath sounds normal. No respiratory distress.  Abdominal: Soft. She exhibits no distension. There is tenderness. There is no rebound and no guarding.  Erythema and induration overlying the RLQ incision, tender to palpation  Genitourinary:  Genitourinary Comments: Deferred  Musculoskeletal: Normal range of motion. She exhibits no deformity.  Neurological: She is alert and oriented to person, place, and time.  Skin: Skin is warm and dry. There is erythema (RLQ overlying incision).  Psychiatric: She has a normal mood and affect.    Results for orders placed or performed during the hospital encounter of 09/01/18 (from the past 48 hour(s))  Basic metabolic panel     Status: Abnormal   Collection Time: 09/01/18  10:03 AM  Result Value Ref Range   Sodium 134 (L) 135 - 145 mmol/L   Potassium 4.0 3.5 - 5.1 mmol/L   Chloride 99 98 - 111 mmol/L   CO2 27 22 - 32 mmol/L   Glucose, Bld 106 (H) 70 - 99 mg/dL   BUN 13 8 - 23 mg/dL   Creatinine, Ser 0.65 0.44 - 1.00 mg/dL   Calcium 8.6 (L) 8.9 - 10.3 mg/dL   GFR calc non Af Amer >60 >60 mL/min   GFR calc Af Amer >60 >60 mL/min   Anion gap 8 5 - 15    Comment: Performed at [facility], [address] [place], [place] [address]  CBC     Status: Abnormal   Collection Time: 09/01/18 10:03 AM  Result Value Ref Range   WBC 18.7 (H) 4.0 - 10.5 K/uL   RBC 4.38 3.87 - 5.11 MIL/uL   Hemoglobin 12.8 12.0 - 15.0 g/dL   HCT 39.6 36.0 - 46.0 %   MCV 90.4  80.0 - 100.0 fL   MCH 29.2 26.0 - 34.0 pg   MCHC 32.3 30.0 - 36.0 g/dL   RDW 15.5 11.5 - 15.5 %   Platelets 502 (H) 150 - 400 K/uL   nRBC 0.0 0.0 - 0.2 %    Comment: Performed at [facility], [address] [place], [place] [address]  Troponin I - ONCE - STAT     Status: None   Collection Time: 09/01/18 10:03 AM  Result Value Ref Range   Troponin I <0.03 <0.03 ng/mL    Comment: Performed at [facility], [address] [place], [place] [address]  Lipase, blood     Status: None   Collection Time: 09/01/18 10:03 AM  Result Value Ref Range   Lipase 22 11 - 51 U/L    Comment: Performed at [facility], [address] [place], [place] [address]  Hepatic function panel     Status: Abnormal   Collection Time: 09/01/18 10:03 AM  Result Value Ref Range   Total Protein 6.8 6.5 - 8.1 g/dL   Albumin 2.8 (L) 3.5 - 5.0 g/dL   AST 13 (L) 15 - 41 U/L   ALT 9 0 - 44 U/L   Alkaline Phosphatase 99 38 - 126 U/L   Total Bilirubin 0.4 0.3 - 1.2 mg/dL   Bilirubin, Direct <0.1 0.0 - 0.2 mg/dL   Indirect Bilirubin NOT CALCULATED 0.3 - 0.9 mg/dL    Comment: Performed at [facility], [address] [place], [place] [address]  T4, free     Status: None   Collection  Time: 09/01/18 10:03 AM  Result Value Ref Range   Free T4 0.91 0.82 - 1.77 ng/dL    Comment: (NOTE) Biotin ingestion may interfere with free T4 tests. If the results are inconsistent with the TSH level, previous test results, or the clinical presentation, then consider biotin interference. If needed, order repeat testing after stopping biotin. Performed at [facility], [address] [place], [place] [address]   TSH     Status: None   Collection Time: 09/01/18 10:03 AM  Result Value Ref Range   TSH 2.854 0.350 - 4.500 uIU/mL    Comment: Performed by a 3rd Generation assay with a functional sensitivity of <=0.01 uIU/mL. Performed at [facility], [address] [place], [place] [address]   Urinalysis, Complete w Microscopic     Status: Abnormal   Collection Time: 09/01/18 11:15 AM  Result Value Ref Range   Color, Urine YELLOW (A) YELLOW   APPearance CLOUDY (A) CLEAR   Specific Gravity, Urine 1.010 1.005 - 1.030   pH 6.0 5.0 - 8.0   Glucose, UA NEGATIVE NEGATIVE mg/dL   Hgb urine dipstick NEGATIVE NEGATIVE   Bilirubin Urine NEGATIVE NEGATIVE   Ketones, ur NEGATIVE NEGATIVE mg/dL   Protein, ur NEGATIVE NEGATIVE mg/dL   Nitrite NEGATIVE NEGATIVE   Leukocytes, UA TRACE (A) NEGATIVE   RBC / HPF 0-5 0 - 5 RBC/hpf   WBC, UA 6-10 0 - 5 WBC/hpf   Bacteria, UA NONE SEEN NONE SEEN   Squamous Epithelial / LPF 11-20 0 - 5   Mucus PRESENT     Comment: Performed at [facility], [address] [place], [place] [address]  Lactic acid, plasma     Status: None   Collection Time: 09/01/18 12:49 PM  Result Value Ref Range   Lactic Acid, Venous 0.8 0.5 - 1.9 mmol/L    Comment: Performed  at [facility], [address] [place], [place] [address]   Dg Chest 2 View  Result Date: 09/01/2018 CLINICAL DATA:  Tachycardia. EXAM: CHEST - 2 VIEW COMPARISON:  Radiograph of July 06, 2018. FINDINGS: The heart size and mediastinal contours are within normal  limits. Both lungs are clear. No pneumothorax or pleural effusion is noted. The visualized skeletal structures are unremarkable. IMPRESSION: No active cardiopulmonary disease. Electronically Signed   By: Marijo Conception, M.D.   On: 09/01/2018 10:57   Ct Abdomen Pelvis W Contrast  Result Date: 09/01/2018 CLINICAL DATA:  Initial evaluation for acute generalized abdominal pain. EXAM: CT ABDOMEN AND PELVIS WITH CONTRAST TECHNIQUE: Multidetector CT imaging of the abdomen and pelvis was performed using the standard protocol following bolus administration of intravenous contrast. CONTRAST:  148mL OMNIPAQUE IOHEXOL 300 MG/ML  SOLN COMPARISON:  Comparison made with prior CT from 07/30/2018. FINDINGS: Lower chest: Scattered subsegmental atelectatic changes seen dependently within visualized lung bases. Visualized lungs are otherwise clear. Hepatobiliary: Liver demonstrates a normal contrast enhanced appearance. Gallbladder within normal limits. No biliary dilatation. Pancreas: Pancreas within normal limits. Spleen: Spleen within normal limits. Adrenals/Urinary Tract: Bilateral adrenal masses measuring 5.7 cm on the left and 2.6 cm on the right, stable. Kidneys equal in size with symmetric enhancement. No nephrolithiasis, hydronephrosis, or focal enhancing renal mass. No hydroureter. Partially distended bladder within normal limits. Stomach/Bowel: Stomach within normal limits. No evidence for bowel obstruction. Postoperative changes from prior partial colectomy with recent colostomy takedown and parastomal hernia repair within the right lower quadrant. Fat stranding within the anterior right lower quadrant and along the undersurface of the right abdominal wall just inferior to the surgical anastomosis, mildly worsened from previous. There are a few small foci of extraluminal gas within this region, consistent with free air (series 2, image 56, 57). Hazy inflammatory stranding within the overlying right abdominal wall.  Irregular loculated collection within the overlying subcutaneous fat measures 3.3 x 2.9 x 4.2 cm (series 2, image 62). Surrounding fat stranding. This has increased from previous. No other intra-abdominal collections. No other acute inflammatory changes about the bowels. Mild colonic diverticulosis without evidence for acute diverticulitis. Moderate retained stool within the colon, which could reflect constipation. Vascular/Lymphatic: In veins aorto bi-iliac atherosclerotic disease. No aneurysm. Mesenteric vessels patent proximally. No pathologically enlarged intra-abdominopelvic lymph nodes. Reproductive: Uterus and ovaries within normal limits. Other: No other significant free intraperitoneal air. No free fluid. Diastasis of the rectus abdominis musculature with additional fat containing paraumbilical hernia noted. No associated inflammation. Postoperative stranding noted within the subcutaneous fat of the left ventral abdomen as well. Musculoskeletal: No acute osseous abnormality. No discrete lytic or blastic osseous lesions. Chronic compression deformity involving the T12 vertebral body, stable. IM fixation rod partially visualize within the left femur. IMPRESSION: 1. Increased inflammatory fat stranding with a few small locules of extraluminal free air within the right lower quadrant adjacent to the surgical anastomosis, concerning for possible anastomotic leak. 3.3 x 2.9 x 4.2 cm collection within subcutaneous fat of the overlying right ventral abdominal wall, worsened from previous. 2. No evidence for bowel obstruction status post partial colectomy and recent colostomy takedown. 3. Moderate retained stool within the residual colon, suggesting constipation. 4. Unchanged bilateral adrenal masses. 5. Advanced aorto bi-iliac atherosclerotic disease. Critical Value/emergent results were called by telephone at the time of interpretation on 09/01/2018 at 1:38 pm to Dr. Carrie Mew , who verbally acknowledged  these results. Electronically Signed   By: Pincus Badder.D.  On: 09/01/2018 13:41      Assessment/Plan  Intra-abdominal abscess Faye B Cheese is a 72 y.o. female with intra-abdominal abscess and tachycardic most likely related to infection who is 2 months s/p ileostomy takedown which is further complicated by pertinent co-morbidities including COPD on home O2, GERD, HLD, Osteoarthritis, advanced age, and tobacco abuse (smoking).     - Admit to general surgery   - NPO for now while awaiting drain placement (okay for clears if drain placement delayed)   - IVF   - Pain control prn, anti-emetics prn   - Continue IV ABx (Zosyn + Vancomycin)     - Monitor AM labs (CBC, BMP)   - Will consult with interventional radiology for possible percutaneous drain placement. If unable to drain, we can consider I&D for this.    - Will consult medicine for additional help managing medical comorbidities and tachycardia.    - Mobilization encouraged   - DVT Prophylaxis  -- Edison Simon, PA-C [facility] 09/01/2018, 2:51 PM [phone] M-F: 7am - 4pm

## 2018-09-01 NOTE — Consult Note (Signed)
CODE SEPSIS - PHARMACY COMMUNICATION  **Broad Spectrum Antibiotics should be administered within 1 hour of Sepsis diagnosis**  Time Code Sepsis Called/Page Received: 1231  Antibiotics Ordered: Vancomycin + Cefepime + Flagyl  Time of 1st antibiotic administration: 1240  Additional action taken by pharmacy: N/A  If necessary, Name of Provider/Nurse Contacted: Robinson Mill, PharmD Pharmacy Resident  09/01/2018 12:33 PM

## 2018-09-01 NOTE — ED Notes (Addendum)
.  FIRST NURSE NOTE: Pt arrives POV from Dr. Caryn Section office. Pt was seen today for RLQ pain possible infection/ abscess at site. Pt had EKG that showed Tachycardic with new onset afib with ST depression. Pt as hx of colon ca and c-diff currently on Vancomycin for this.

## 2018-09-01 NOTE — ED Provider Notes (Signed)
[facility] Emergency Department Provider Note  ____________________________________________  Time seen: Approximately 12:32 PM  I have reviewed the triage vital signs and the nursing notes.   HISTORY  Chief Complaint Tachycardia    HPI Kimberly [name] is a 72 y.o. female with a history of colon cancer COPD chronic pain and breast cancer who was sent to the ED by her primary care doctor today due to tachycardia and new onset atrial fibrillation.  The patient also reports that she is been having right lower quadrant abdominal pain for the past 2 days.  She is had multiple abdominal surgeries in the past.  Pain is been constant, waxing and waning, worse with movement no alleviating factors.  Moderate intensity, dull ache.  Patient is currently taking oral vancomycin for C. difficile infection.      Past Medical History:  Diagnosis Date  . Breast cancer, left (HCC)    Lumpectomy and rad tx's.  . Chronic back pain   . Colon cancer ([place])   . COPD (chronic obstructive pulmonary disease) ([place])   . Degenerative disc disease, lumbar 04/2013  . Dyspnea   . Family history of adverse reaction to anesthesia    son arrested after anesthesia  . GERD (gastroesophageal reflux disease)   . Hyperlipidemia   . Iron deficiency anemia 05/27/2017  . Neuropathy   . Osteoarthritis    of knee  . Osteoporosis   . Small bowel obstruction ([place]) 04/16/2017  . Tobacco abuse   . Vitamin D deficiency      Patient Active Problem List   Diagnosis Date Noted  . Dependence on nocturnal oxygen therapy 08/12/2018  . Clostridium difficile colitis 07/31/2018  . S/P colostomy takedown 06/25/2018  . Parastomal hernia without obstruction or gangrene   . Status post colon resection   . Visit for diagnostic endoscopy   . Colostomy status ([place])   . Colostomy prolapse ([place])   . Hypokalemia 05/27/2017  . Hypomagnesemia 05/27/2017  . Iron deficiency anemia 05/27/2017  . Port-A-Cath in  place   . Left peroneal vein thrombosis   . Malignant neoplasm of transverse colon ([place]) 04/26/2017  . Cellulitis   . Generalized abdominal pain   . Abdominal pain, generalized   . Loss of weight   . Gastritis without bleeding   . Absence of bladder continence 04/01/2017  . Asthmatic breathing 04/01/2017  . Abnormal abdominal x-ray 02/11/2017  . OP (osteoporosis) 02/11/2017  . Skin lesion 02/11/2017  . Breast swelling 02/11/2017  . Narrowing of intervertebral disc space 02/11/2017  . Family history of colon cancer 02/11/2017  . Foot pain 02/11/2017  . Arthralgia of hip 02/11/2017  . Personal history of malignant neoplasm of breast 02/11/2017  . Eczema intertrigo 02/11/2017  . Neuropathy 02/11/2017  . Osteoarthrosis, unspecified whether generalized or localized, involving lower leg 02/11/2017  . Coronary atherosclerosis 01/29/2017  . Smoking greater than 30 pack years 01/10/2017  . Vitamin D deficiency 08/21/2016  . Hyperlipidemia 11/10/2015  . Chronic back pain 05/26/2015  . DDD (degenerative disc disease), lumbosacral 05/26/2015  . History of colon polyps 04/27/2007     Past Surgical History:  Procedure Laterality Date  . APPENDECTOMY  1965  . BREAST EXCISIONAL BIOPSY Left 08/01/2003   lumpectomy rad 11/04-2/28/2005  . BREAST SURGERY    . CESAREAN SECTION     x3  . COLON SURGERY    . COLONOSCOPY W/ POLYPECTOMY    . COLONOSCOPY WITH PROPOFOL N/A 04/09/2018   Procedure: COLONOSCOPY WITH PROPOFOL;  Surgeon: Virgel Manifold, MD;  Location: [facility] ENDOSCOPY;  Service: Gastroenterology;  Laterality: N/A;  . COLOSTOMY TAKEDOWN N/A 06/25/2018   Procedure: COLOSTOMY TAKEDOWN;  Surgeon: Jules Husbands, MD;  Location: [facility] ORS;  Service: General;  Laterality: N/A;  . ESOPHAGOGASTRODUODENOSCOPY (EGD) WITH PROPOFOL N/A 04/04/2017   Procedure: ESOPHAGOGASTRODUODENOSCOPY (EGD) WITH PROPOFOL;  Surgeon: [name], MD;  Location: [place];  Service: Endoscopy;  Laterality:  N/A;  . FRACTURE SURGERY    . HIP FRACTURE SURGERY Left 01/25/2012  . JOINT REPLACEMENT    . LAPAROTOMY N/A 04/15/2017   Procedure: EXPLORATORY LAPAROTOMY;  Surgeon: Clayburn Pert, MD;  Location: [facility] ORS;  Service: General;  Laterality: N/A;  . NECK SURGERY  12/2011  . PARASTOMAL HERNIA REPAIR N/A 12/03/2017   Procedure: HERNIA REPAIR PARASTOMAL;  Surgeon: Jules Husbands, MD;  Location: [facility] ORS;  Service: General;  Laterality: N/A;  . PARASTOMAL HERNIA REPAIR N/A 06/25/2018   Procedure: HERNIA REPAIR PARASTOMAL;  Surgeon: Jules Husbands, MD;  Location: [facility] ORS;  Service: General;  Laterality: N/A;  . PORTACATH PLACEMENT Right 05/21/2017   Procedure: INSERTION PORT-A-CATH;  Surgeon: Clayburn Pert, MD;  Location: [facility] ORS;  Service: General;  Laterality: Right;  . WRIST FRACTURE SURGERY       Prior to Admission medications   Medication Sig Start Date End Date Taking? Authorizing Provider  budesonide-formoterol (SYMBICORT) 80-4.5 MCG/ACT inhaler Inhale 2 puffs into the lungs 2 (two) times daily. 06/09/18  Yes Laverle Hobby, MD  cholecalciferol (VITAMIN D) 1000 units tablet Take 1,000 Units by mouth daily.   Yes [provider]  gabapentin (NEURONTIN) 400 MG capsule TAKE 1 CAPSULE TWICE DAILY Patient taking differently: Take 400 mg by mouth 2 (two) times daily.  09/28/17  Yes Birdie Sons, MD  HYDROcodone-acetaminophen (NORCO) 7.5-325 MG tablet Take 1-2 tablets by mouth every 6 (six) hours as needed for moderate pain. 08/24/18  Yes Birdie Sons, MD  ibuprofen (ADVIL,MOTRIN) 200 MG tablet Take 400 mg by mouth daily as needed for headache or moderate pain.   Yes [provider]  meloxicam (MOBIC) 15 MG tablet TAKE 1 TABLET EVERY DAY AS NEEDED 08/11/18  Yes Birdie Sons, MD  montelukast (SINGULAIR) 10 MG tablet Take 1 tablet (10 mg total) by mouth daily. 06/09/18  Yes Laverle Hobby, MD  omeprazole (PRILOSEC) 20 MG capsule TAKE 1 CAPSULE EVERY  DAY Patient taking differently: Take 20 mg by mouth daily.  02/10/18  Yes Birdie Sons, MD  ondansetron (ZOFRAN) 4 MG tablet Take 0.5-1 tablets (2-4 mg total) by mouth every 8 (eight) hours as needed for nausea or vomiting. 08/19/18  Yes Birdie Sons, MD  pravastatin (PRAVACHOL) 40 MG tablet TAKE 1 TABLET EVERY DAY Patient taking differently: Take 40 mg by mouth daily.  04/27/18  Yes Birdie Sons, MD  Probiotic Product (PROBIOTIC DAILY PO) Take 1 capsule by mouth daily.    Yes [provider]  pyridOXINE (VITAMIN B-6) 50 MG tablet Take 1 tablet (50 mg total) by mouth daily. 06/24/17  Yes Earlie Server, MD  Triamcinolone Acetonide (NASACORT ALLERGY 24HR NA) Place 2 sprays into the nose daily.    Yes [provider]  vancomycin (VANCOCIN) 125 MG capsule Take 1 capsule (125 mg total) by mouth 4 (four) times daily. 08/23/18  Yes Birdie Sons, MD  vitamin B-12 (CYANOCOBALAMIN) 1000 MCG tablet Take 1,000 mcg by mouth daily.   Yes [provider]  feeding supplement (BOOST / RESOURCE BREEZE)  LIQD Take 1 Container by mouth 3 (three) times daily between meals. Patient not taking: Reported on 09/01/2018 04/19/17   Fritzi Mandes, MD     Allergies Betadine [povidone iodine]; Other; and Sinus formula [cholestatin]   Family History  Problem Relation Age of Onset  . Diabetes Mother        type 2  . Coronary artery disease Mother   . Deep vein thrombosis Mother   . Colon cancer Father   . Asthma Brother   . Diabetes Brother        type 2    Social History Social History   Tobacco Use  . Smoking status: Former Smoker    Packs/day: 0.25    Years: 55.00    Pack years: 13.75    Types: Cigarettes    Last attempt to quit: 05/21/2017    Years since quitting: 1.2  . Smokeless tobacco: Never Used  . Tobacco comment: started age 60 1/3 to 1 ppd  Substance Use Topics  . Alcohol use: Yes    Alcohol/week: 0.0 standard drinks    Comment: occasionally drinks beer  .  Drug use: No    Review of Systems  Constitutional:   Positive fever without chills.  ENT:   No sore throat. No rhinorrhea. Cardiovascular:   No chest pain or syncope. Respiratory:   No dyspnea or cough. Gastrointestinal:   Positive abdominal pain without vomiting and diarrhea.  Musculoskeletal:   Negative for focal pain or swelling All other systems reviewed and are negative except as documented above in ROS and HPI.  ____________________________________________   PHYSICAL EXAM:  VITAL SIGNS: ED Triage Vitals  Enc Vitals Group     BP 09/01/18 0959 125/63     Pulse Rate 09/01/18 0959 (!) 107     Resp 09/01/18 0959 (!) 22     Temp 09/01/18 0959 99 F (37.2 C)     Temp src --      SpO2 09/01/18 0959 94 %     Weight 09/01/18 0959 156 lb (70.8 kg)     Height 09/01/18 0955 5\' 5"  (1.651 m)     Head Circumference --      Peak Flow --      Pain Score 09/01/18 0955 7     Pain Loc --      Pain Edu? --      Excl. in [place]? --     Vital signs reviewed, nursing assessments reviewed.   Constitutional:   Alert and oriented. Non-toxic appearance. Eyes:   Conjunctivae are normal. EOMI. PERRL. ENT      Head:   Normocephalic and atraumatic.      Nose:   No congestion/rhinnorhea.       Mouth/Throat:   Dry mucous membranes, no pharyngeal erythema. No peritonsillar mass.       Neck:   No meningismus. Full ROM. Hematological/Lymphatic/Immunilogical:   No cervical lymphadenopathy. Cardiovascular:   Tachycardia heart rate 110. Symmetric bilateral radial and DP pulses.  No murmurs. Cap refill less than 2 seconds. Respiratory:   Normal respiratory effort without tachypnea/retractions. Breath sounds are clear and equal bilaterally. No wheezes/rales/rhonchi. Gastrointestinal:   Soft, tenderness in the right lower quadrant where there appears to be a subcutaneous mass, possibly abscess.  There is some erythema of the abdominal wall in this area.  There is also tenderness of the abdomen more  generally with deep palpation.  Multiple large incision scars from previous surgeries..  There is fullness of the abdomen with  palpation, large scar burden versus intra-abdominal mass versus ovarian cyst remain possibilities.  Non distended. There is no CVA tenderness.  No rebound, rigidity, or guarding. Musculoskeletal:   Normal range of motion in all extremities. No joint effusions.  No lower extremity tenderness.  No edema. Neurologic:   Normal speech and language.  Motor grossly intact. No acute focal neurologic deficits are appreciated.  Skin:    Skin is warm, dry and intact. No rash noted.  No petechiae, purpura, or bullae.  ____________________________________________    LABS (pertinent positives/negatives) (all labs ordered are listed, but only abnormal results are displayed) Labs Reviewed  BASIC METABOLIC PANEL - Abnormal; Notable for the following components:      Result Value   Sodium 134 (*)    Glucose, Bld 106 (*)    Calcium 8.6 (*)    All other components within normal limits  CBC - Abnormal; Notable for the following components:   WBC 18.7 (*)    Platelets 502 (*)    All other components within normal limits  HEPATIC FUNCTION PANEL - Abnormal; Notable for the following components:   Albumin 2.8 (*)    AST 13 (*)    All other components within normal limits  URINALYSIS, COMPLETE (UACMP) WITH MICROSCOPIC - Abnormal; Notable for the following components:   Color, Urine YELLOW (*)    APPearance CLOUDY (*)    Leukocytes, UA TRACE (*)    All other components within normal limits  URINE CULTURE  CULTURE, BLOOD (ROUTINE X 2)  CULTURE, BLOOD (ROUTINE X 2)  TROPONIN I  LIPASE, BLOOD  T4, FREE  TSH  LACTIC ACID, PLASMA   ____________________________________________   EKG  Interpreted by me Sinus tachycardia rate 108, normal axis and intervals.  Normal QRS ST segments and T waves.  ____________________________________________    RADIOLOGY  Dg Chest 2  View  Result Date: 09/01/2018 CLINICAL DATA:  Tachycardia. EXAM: CHEST - 2 VIEW COMPARISON:  Radiograph of July 06, 2018. FINDINGS: The heart size and mediastinal contours are within normal limits. Both lungs are clear. No pneumothorax or pleural effusion is noted. The visualized skeletal structures are unremarkable. IMPRESSION: No active cardiopulmonary disease. Electronically Signed   By: Marijo Conception, M.D.   On: 09/01/2018 10:57   Ct Abdomen Pelvis W Contrast  Result Date: 09/01/2018 CLINICAL DATA:  Initial evaluation for acute generalized abdominal pain. EXAM: CT ABDOMEN AND PELVIS WITH CONTRAST TECHNIQUE: Multidetector CT imaging of the abdomen and pelvis was performed using the standard protocol following bolus administration of intravenous contrast. CONTRAST:  154mL OMNIPAQUE IOHEXOL 300 MG/ML  SOLN COMPARISON:  Comparison made with prior CT from 07/30/2018. FINDINGS: Lower chest: Scattered subsegmental atelectatic changes seen dependently within visualized lung bases. Visualized lungs are otherwise clear. Hepatobiliary: Liver demonstrates a normal contrast enhanced appearance. Gallbladder within normal limits. No biliary dilatation. Pancreas: Pancreas within normal limits. Spleen: Spleen within normal limits. Adrenals/Urinary Tract: Bilateral adrenal masses measuring 5.7 cm on the left and 2.6 cm on the right, stable. Kidneys equal in size with symmetric enhancement. No nephrolithiasis, hydronephrosis, or focal enhancing renal mass. No hydroureter. Partially distended bladder within normal limits. Stomach/Bowel: Stomach within normal limits. No evidence for bowel obstruction. Postoperative changes from prior partial colectomy with recent colostomy takedown and parastomal hernia repair within the right lower quadrant. Fat stranding within the anterior right lower quadrant and along the undersurface of the right abdominal wall just inferior to the surgical anastomosis, mildly worsened from previous.  There are a few  small foci of extraluminal gas within this region, consistent with free air (series 2, image 56, 57). Hazy inflammatory stranding within the overlying right abdominal wall. Irregular loculated collection within the overlying subcutaneous fat measures 3.3 x 2.9 x 4.2 cm (series 2, image 62). Surrounding fat stranding. This has increased from previous. No other intra-abdominal collections. No other acute inflammatory changes about the bowels. Mild colonic diverticulosis without evidence for acute diverticulitis. Moderate retained stool within the colon, which could reflect constipation. Vascular/Lymphatic: In veins aorto bi-iliac atherosclerotic disease. No aneurysm. Mesenteric vessels patent proximally. No pathologically enlarged intra-abdominopelvic lymph nodes. Reproductive: Uterus and ovaries within normal limits. Other: No other significant free intraperitoneal air. No free fluid. Diastasis of the rectus abdominis musculature with additional fat containing paraumbilical hernia noted. No associated inflammation. Postoperative stranding noted within the subcutaneous fat of the left ventral abdomen as well. Musculoskeletal: No acute osseous abnormality. No discrete lytic or blastic osseous lesions. Chronic compression deformity involving the T12 vertebral body, stable. IM fixation rod partially visualize within the left femur. IMPRESSION: 1. Increased inflammatory fat stranding with a few small locules of extraluminal free air within the right lower quadrant adjacent to the surgical anastomosis, concerning for possible anastomotic leak. 3.3 x 2.9 x 4.2 cm collection within subcutaneous fat of the overlying right ventral abdominal wall, worsened from previous. 2. No evidence for bowel obstruction status post partial colectomy and recent colostomy takedown. 3. Moderate retained stool within the residual colon, suggesting constipation. 4. Unchanged bilateral adrenal masses. 5. Advanced aorto bi-iliac  atherosclerotic disease. Critical Value/emergent results were called by telephone at the time of interpretation on 09/01/2018 at 1:38 pm to Dr. Carrie Mew , who verbally acknowledged these results. Electronically Signed   By: Jeannine Boga M.D.   On: 09/01/2018 13:41    ____________________________________________   PROCEDURES .Critical Care Performed by: Carrie Mew, MD Authorized by: Carrie Mew, MD   Critical care provider statement:    Critical care time (minutes):  35   Critical care time was exclusive of:  Separately billable procedures and treating other patients   Critical care was necessary to treat or prevent imminent or life-threatening deterioration of the following conditions:  Sepsis   Critical care was time spent personally by me on the following activities:  Development of treatment plan with patient or surrogate, discussions with consultants, evaluation of patient's response to treatment, examination of patient, obtaining history from patient or surrogate, ordering and performing treatments and interventions, ordering and review of laboratory studies, ordering and review of radiographic studies, pulse oximetry, re-evaluation of patient's condition and review of old charts    ____________________________________________  DIFFERENTIAL DIAGNOSIS   Abdominal wall abscess, diverticulitis, bowel perforation, cellulitis, UTI, pneumonia, new onset atrial fibrillation, non-STEMI, intra-abdominal tumor  CLINICAL IMPRESSION / ASSESSMENT AND PLAN / ED COURSE  Pertinent labs & imaging results that were available during my care of the patient were reviewed by me and considered in my medical decision making (see chart for details).    Patient arrives with abdominal pain and tachycardia, concern for new onset atrial fibrillation from primary care office.  Vital signs appear normal at this time.  Check labs, will obtain CT scan of the abdomen and pelvis to  evaluate for abdominal wall abscess versus intra-abdominal infectious or neoplastic process.  Clinical Course as of Sep 01 1349  Tue Sep 01, 2018  1230 Nurse reports that patient's blood pressure has just become hypotensive about 80/60.  I reviewed available electronic medical records and did  see that she was at her primary care office this morning where she reported a temp of 101 last night.  With these findings, in addition to the tachycardia and tachypnea she had on arrival and now finding her leukocytosis of 18,000 on the CBC result, this evolving clinical picture indicates sepsis at this time.  I will start the code sepsis protocol, check lactate and blood cultures, give antibiotics and fluids.   [PS]  1333 lactate normal.   [PS]  1340 CT discussed with radiology who finds evidence of an anastomosis leak from prior surgical procedure with small amount of free air and inflammatory change in the abdomen and overlying 3 cm abscess in the abdominal wall.   [PS]  1348 Case discussed with surgery who will come evaluate.  Patient is already received cefepime and is receiving Flagyl at this time for antibiotic coverage.  Most likely this is all from gram-negative enterics, so vancomycin may be able to be discontinued.  I will follow-up surgery recommendations regarding ongoing antibiotic's.   [PS]    Clinical Course User Index [PS] Carrie Mew, MD     ____________________________________________   FINAL CLINICAL IMPRESSION(S) / ED DIAGNOSES    Final diagnoses:  Abdominal wall abscess  Small intestinal anastomotic leak  Sepsis, due to unspecified organism, unspecified whether acute organ dysfunction present [facility])     ED Discharge Orders    None      Portions of this note were generated with dragon dictation software. Dictation errors may occur despite best attempts at proofreading.    Carrie Mew, MD 09/01/18 1350

## 2018-09-01 NOTE — ED Notes (Signed)
Family member at bedside, pt resting in bed. Call light within reach. No concerns or needs voiced at this time. Will continue to monitor for changes.

## 2018-09-01 NOTE — ED Notes (Signed)
FIRST NURSE NOTE: Pt name was called first attempt at this time.

## 2018-09-02 ENCOUNTER — Inpatient Hospital Stay: Payer: Medicare HMO

## 2018-09-02 LAB — BASIC METABOLIC PANEL
ANION GAP: 5 (ref 5–15)
BUN: 8 mg/dL (ref 8–23)
CALCIUM: 8 mg/dL — AB (ref 8.9–10.3)
CO2: 26 mmol/L (ref 22–32)
Chloride: 107 mmol/L (ref 98–111)
Creatinine, Ser: 0.55 mg/dL (ref 0.44–1.00)
GFR calc Af Amer: >60 mL/min (ref >60)
Glucose, Bld: 113 mg/dL — ABNORMAL HIGH (ref 70–99)
POTASSIUM: 3.6 mmol/L (ref 3.5–5.1)
Sodium: 138 mmol/L (ref 135–145)

## 2018-09-02 LAB — CBC
HCT: 36.4 % (ref 36.0–46.0)
Hemoglobin: 11.3 g/dL — ABNORMAL LOW (ref 12.0–15.0)
MCH: 29 pg (ref 26.0–34.0)
MCHC: 31 g/dL (ref 30.0–36.0)
MCV: 93.3 fL (ref 80.0–100.0)
NRBC: 0 % (ref 0.0–0.2)
Platelets: 353 10*3/uL (ref 150–400)
RBC: 3.9 MIL/uL (ref 3.87–5.11)
RDW: 15.7 % — ABNORMAL HIGH (ref 11.5–15.5)
WBC: 17.9 10*3/uL — AB (ref 4.0–10.5)

## 2018-09-02 MED ORDER — VITAMIN D 25 MCG (1000 UNIT) PO TABS
1000.0000 [IU] | ORAL_TABLET | Freq: Every day | ORAL | Status: DC
  Administered 2018-09-02 – 2018-09-04 (×3): 1000 [IU] via ORAL
  Filled 2018-09-02 (×3): qty 1

## 2018-09-02 MED ORDER — PRAVASTATIN SODIUM 20 MG PO TABS
40.0000 mg | ORAL_TABLET | Freq: Every day | ORAL | Status: DC
  Administered 2018-09-02 – 2018-09-04 (×3): 40 mg via ORAL
  Filled 2018-09-02 (×3): qty 2

## 2018-09-02 MED ORDER — SODIUM CHLORIDE 0.9% FLUSH
5.0000 mL | Freq: Two times a day (BID) | INTRAVENOUS | Status: DC
  Administered 2018-09-02 – 2018-09-04 (×5): 5 mL via INTRAVENOUS

## 2018-09-02 MED ORDER — MONTELUKAST SODIUM 10 MG PO TABS
10.0000 mg | ORAL_TABLET | Freq: Every day | ORAL | Status: DC
  Administered 2018-09-02 – 2018-09-04 (×3): 10 mg via ORAL
  Filled 2018-09-02 (×3): qty 1

## 2018-09-02 MED ORDER — SODIUM CHLORIDE 0.9 % IV SOLN
INTRAVENOUS | Status: DC | PRN
  Administered 2018-09-02 – 2018-09-03 (×3): 250 mL via INTRAVENOUS

## 2018-09-02 MED ORDER — GABAPENTIN 400 MG PO CAPS
400.0000 mg | ORAL_CAPSULE | Freq: Two times a day (BID) | ORAL | Status: DC
  Administered 2018-09-02 – 2018-09-04 (×4): 400 mg via ORAL
  Filled 2018-09-02 (×4): qty 1

## 2018-09-02 NOTE — Plan of Care (Signed)
  Problem: Education: Goal: Knowledge of General Education information will improve Description Including pain rating scale, medication(s)/side effects and non-pharmacologic comfort measures Outcome: Progressing   Problem: Health Behavior/Discharge Planning: Goal: Ability to manage health-related needs will improve Outcome: Progressing   Problem: Skin Integrity: Goal: Risk for impaired skin integrity will decrease Outcome: Progressing   Problem: Safety: Goal: Ability to remain free from injury will improve Outcome: Progressing   Problem: Pain Managment: Goal: General experience of comfort will improve Outcome: Progressing  Pain managed with prn/scheduled medications.    Problem: Elimination: Goal: Will not experience complications related to bowel motility Outcome: Progressing Goal: Will not experience complications related to urinary retention Outcome: Progressing   Problem: Elimination: Goal: Will not experience complications related to bowel motility Outcome: Progressing Goal: Will not experience complications related to urinary retention Outcome: Progressing

## 2018-09-02 NOTE — Progress Notes (Signed)
[facility] Progress Note     Subjective: No acute events overnight. She feels that her abscess in her RLQ has improved some and is less tender. No fever, chills, nausea, or emesis. Continues to pass flatus. Npo with plan for IR aspiration s drain placement today.   Objective: Vital signs in last 24 hours: Temp:  [97.4 F (36.3 C)-99 F (37.2 C)] 97.4 F (36.3 C) (12/04 0550) Pulse Rate:  [71-118] 71 (12/04 0550) Resp:  [13-39] 18 (12/04 0550) BP: (86-142)/(47-108) 93/55 (12/04 0550) SpO2:  [86 %-99 %] 91 % (12/04 0550) Weight:  [70.8 kg] 70.8 kg (12/03 0959) Last BM Date: 09/01/18  Intake/Output from previous day: 12/03 0701 - 12/04 0700 In: 3613.5 [I.V.:622.6; IV Piggyback:2990.9] Out: 700 [Urine:700] Intake/Output this shift: No intake/output data recorded.  PE: Gen:  Alert, NAD, pleasant Pulm:  Normal effort Abd: Soft, non-tender, non-distended, improved induration, erythema over RLQ incision Skin: warm and dry, no rashes  Psych: A&Ox3   Lab Results:  Recent Labs    09/01/18 1003 09/02/18 0440  WBC 18.7* 17.9*  HGB 12.8 11.3*  HCT 39.6 36.4  PLT 502* 353   BMET Recent Labs    09/01/18 1003 09/02/18 0440  NA 134* 138  K 4.0 3.6  CL 99 107  CO2 27 26  GLUCOSE 106* 113*  BUN 13 8  CREATININE 0.65 0.55  CALCIUM 8.6* 8.0*   PT/INR No results for input(s): LABPROT, INR in the last 72 hours. CMP     Component Value Date/Time   NA 138 09/02/2018 0440   NA 141 01/02/2017 1410   NA 136 01/27/2012 0637   K 3.6 09/02/2018 0440   K 4.3 01/27/2012 0637   CL 107 09/02/2018 0440   CL 102 01/27/2012 0637   CO2 26 09/02/2018 0440   CO2 25 01/27/2012 0637   GLUCOSE 113 (H) 09/02/2018 0440   GLUCOSE 119 (H) 01/27/2012 0637   BUN 8 09/02/2018 0440   BUN 14 01/02/2017 1410   BUN 15 01/27/2012 0637   CREATININE 0.55 09/02/2018 0440   CREATININE 0.67 01/27/2012 0637   CALCIUM 8.0 (L) 09/02/2018 0440   CALCIUM 7.7 (L) 01/27/2012 0637   PROT  6.8 09/01/2018 1003   PROT 7.0 01/02/2017 1410   PROT 7.6 01/26/2012 0021   ALBUMIN 2.8 (L) 09/01/2018 1003   ALBUMIN 3.9 01/02/2017 1410   ALBUMIN 3.5 01/26/2012 0021   AST 13 (L) 09/01/2018 1003   AST 17 01/26/2012 0021   ALT 9 09/01/2018 1003   ALT 19 01/26/2012 0021   ALKPHOS 99 09/01/2018 1003   ALKPHOS 87 01/26/2012 0021   BILITOT 0.4 09/01/2018 1003   BILITOT <0.2 01/02/2017 1410   BILITOT 0.2 01/26/2012 0021   GFRNONAA >60 09/02/2018 0440   GFRNONAA >60 01/27/2012 0637   GFRAA >60 09/02/2018 0440   GFRAA >60 01/27/2012 0637   Lipase     Component Value Date/Time   LIPASE 22 09/01/2018 1003       Studies/Results: Dg Chest 2 View  Result Date: 09/01/2018 CLINICAL DATA:  Tachycardia. EXAM: CHEST - 2 VIEW COMPARISON:  Radiograph of July 06, 2018. FINDINGS: The heart size and mediastinal contours are within normal limits. Both lungs are clear. No pneumothorax or pleural effusion is noted. The visualized skeletal structures are unremarkable. IMPRESSION: No active cardiopulmonary disease. Electronically Signed   By: Marijo Conception, M.D.   On: 09/01/2018 10:57   Ct Abdomen Pelvis W Contrast  Result Date: 09/01/2018 CLINICAL DATA:  Initial evaluation for acute generalized abdominal pain. EXAM: CT ABDOMEN AND PELVIS WITH CONTRAST TECHNIQUE: Multidetector CT imaging of the abdomen and pelvis was performed using the standard protocol following bolus administration of intravenous contrast. CONTRAST:  122mL OMNIPAQUE IOHEXOL 300 MG/ML  SOLN COMPARISON:  Comparison made with prior CT from 07/30/2018. FINDINGS: Lower chest: Scattered subsegmental atelectatic changes seen dependently within visualized lung bases. Visualized lungs are otherwise clear. Hepatobiliary: Liver demonstrates a normal contrast enhanced appearance. Gallbladder within normal limits. No biliary dilatation. Pancreas: Pancreas within normal limits. Spleen: Spleen within normal limits. Adrenals/Urinary Tract:  Bilateral adrenal masses measuring 5.7 cm on the left and 2.6 cm on the right, stable. Kidneys equal in size with symmetric enhancement. No nephrolithiasis, hydronephrosis, or focal enhancing renal mass. No hydroureter. Partially distended bladder within normal limits. Stomach/Bowel: Stomach within normal limits. No evidence for bowel obstruction. Postoperative changes from prior partial colectomy with recent colostomy takedown and parastomal hernia repair within the right lower quadrant. Fat stranding within the anterior right lower quadrant and along the undersurface of the right abdominal wall just inferior to the surgical anastomosis, mildly worsened from previous. There are a few small foci of extraluminal gas within this region, consistent with free air (series 2, image 56, 57). Hazy inflammatory stranding within the overlying right abdominal wall. Irregular loculated collection within the overlying subcutaneous fat measures 3.3 x 2.9 x 4.2 cm (series 2, image 62). Surrounding fat stranding. This has increased from previous. No other intra-abdominal collections. No other acute inflammatory changes about the bowels. Mild colonic diverticulosis without evidence for acute diverticulitis. Moderate retained stool within the colon, which could reflect constipation. Vascular/Lymphatic: In veins aorto bi-iliac atherosclerotic disease. No aneurysm. Mesenteric vessels patent proximally. No pathologically enlarged intra-abdominopelvic lymph nodes. Reproductive: Uterus and ovaries within normal limits. Other: No other significant free intraperitoneal air. No free fluid. Diastasis of the rectus abdominis musculature with additional fat containing paraumbilical hernia noted. No associated inflammation. Postoperative stranding noted within the subcutaneous fat of the left ventral abdomen as well. Musculoskeletal: No acute osseous abnormality. No discrete lytic or blastic osseous lesions. Chronic compression deformity  involving the T12 vertebral body, stable. IM fixation rod partially visualize within the left femur. IMPRESSION: 1. Increased inflammatory fat stranding with a few small locules of extraluminal free air within the right lower quadrant adjacent to the surgical anastomosis, concerning for possible anastomotic leak. 3.3 x 2.9 x 4.2 cm collection within subcutaneous fat of the overlying right ventral abdominal wall, worsened from previous. 2. No evidence for bowel obstruction status post partial colectomy and recent colostomy takedown. 3. Moderate retained stool within the residual colon, suggesting constipation. 4. Unchanged bilateral adrenal masses. 5. Advanced aorto bi-iliac atherosclerotic disease. Critical Value/emergent results were called by telephone at the time of interpretation on 09/01/2018 at 1:38 pm to Dr. Carrie Mew , who verbally acknowledged these results. Electronically Signed   By: Jeannine Boga M.D.   On: 09/01/2018 13:41    Anti-infectives: Anti-infectives (From admission, onward)   Start     Dose/Rate Route Frequency Ordered Stop   09/02/18 0000  vancomycin (VANCOCIN) 50 mg/mL oral solution 125 mg     125 mg Oral Every 6 hours 09/01/18 2337 09/05/18 2359   09/01/18 2200  piperacillin-tazobactam (ZOSYN) IVPB 3.375 g     3.375 g 12.5 mL/hr over 240 Minutes Intravenous Every 8 hours 09/01/18 2030     09/01/18 2200  ceFEPIme (MAXIPIME) 2 g in sodium chloride 0.9 % 100 mL IVPB  Status:  Discontinued     2 g 200 mL/hr over 30 Minutes Intravenous Every 12 hours 09/01/18 1510 09/01/18 2102   09/01/18 2000  vancomycin (VANCOCIN) 1,500 mg in sodium chloride 0.9 % 500 mL IVPB     1,500 mg 250 mL/hr over 120 Minutes Intravenous Every 24 hours 09/01/18 1510     09/01/18 1245  ceFEPIme (MAXIPIME) 2 g in sodium chloride 0.9 % 100 mL IVPB     2 g 200 mL/hr over 30 Minutes Intravenous  Once 09/01/18 1230 09/01/18 1310   09/01/18 1245  metroNIDAZOLE (FLAGYL) IVPB 500 mg  Status:   Discontinued     500 mg 100 mL/hr over 60 Minutes Intravenous Every 8 hours 09/01/18 1230 09/01/18 2030   09/01/18 1245  vancomycin (VANCOCIN) IVPB 1000 mg/200 mL premix     1,000 mg 200 mL/hr over 60 Minutes Intravenous  Once 09/01/18 1230 09/01/18 1621       Assessment/Plan  Abdominal wall abscess Kimberly [name] is a 72 y.o. female with abdominal wall abscess who is 2 months s/p ileostomy takedown which is further complicated by pertinent co-morbidities including COPD on home O2, GERD, HLD, Osteoarthritis, advanced age, and tobacco abuse (smoking).     - Plan for IR procedure (aspiration vs drain placement)   - NPO for now in anticipation of IR procedure, okay for regular diet after   - Continue IVF for now   - Monitor abdominal examination   - Pain control prn   - Continue IV Abx (Zosyn)   - Medical management of comorbidities, appreciate medicines help   - DVT prophylaxis  -- Edison Simon , PA-C [facility] 09/02/2018, 8:59 AM [phone] M-F: 7am - 4pm

## 2018-09-02 NOTE — Procedures (Signed)
Interventional Radiology Procedure Note  Procedure: US guided drainage of superficial abdominal wall abscess  Complications: None  Estimated Blood Loss: Minimal  Findings: 20 ml of gray purulent fluid removed.  Left a 10 Fr drain in place and attached to suction bulb.  Anticipate minimal drainage.  Will send fluid for culture.   Kimberly [name] R. Anselm Pancoast, M.D Pager:  [phone]

## 2018-09-02 NOTE — Progress Notes (Signed)
[place] at [facility]                                                                                                                                                                                  Patient Demographics   Kimberly [name], is a 72 y.o. female, DOB - [birth year], UDJ:[id number]  Admit date - 09/01/2018   Admitting Physician Jules Husbands, MD  Outpatient Primary MD for the patient is Fisher, Kirstie Peri, MD   LOS - 1  Subjective: Patient had a drain placed earlier currently denies any pain    Review of Systems:   CONSTITUTIONAL: No documented fever. No fatigue, weakness. No weight gain, no weight loss.  EYES: No blurry or double vision.  ENT: No tinnitus. No postnasal drip. No redness of the oropharynx.  RESPIRATORY: No cough, no wheeze, no hemoptysis. No dyspnea.  CARDIOVASCULAR: No chest pain. No orthopnea. No palpitations. No syncope.  GASTROINTESTINAL: No nausea, no vomiting or diarrhea. No abdominal pain. No melena or hematochezia.  GENITOURINARY: No dysuria or hematuria.  ENDOCRINE: No polyuria or nocturia. No heat or cold intolerance.  HEMATOLOGY: No anemia. No bruising. No bleeding.  INTEGUMENTARY: No rashes. No lesions.  MUSCULOSKELETAL: No arthritis. No swelling. No gout.  NEUROLOGIC: No numbness, tingling, or ataxia. No seizure-type activity.  PSYCHIATRIC: No anxiety. No insomnia. No ADD.    Vitals:   Vitals:   09/01/18 2001 09/01/18 2020 09/02/18 0550 09/02/18 1028  BP: 129/64 128/61 (!) 93/55 (!) 146/67  Pulse: 89 90 71 72  Resp: 20 18 18 18   Temp:  98.7 F (37.1 C) (!) 97.4 F (36.3 C)   TempSrc:  Oral Oral   SpO2: 99% 92% 91% 94%  Weight:      Height:        Wt Readings from Last 3 Encounters:  09/01/18 70.8 kg  09/01/18 70.8 kg  08/21/18 70.3 kg     Intake/Output Summary (Last 24 hours) at 09/02/2018 1456 Last data filed at 09/02/2018 0609 Gross per 24 hour  Intake 1163.46 ml  Output 700 ml  Net 463.46 ml     Physical Exam:   GENERAL: Pleasant-appearing in no apparent distress.  HEAD, EYES, EARS, NOSE AND THROAT: Atraumatic, normocephalic. Extraocular muscles are intact. Pupils equal and reactive to light. Sclerae anicteric. No conjunctival injection. No oro-pharyngeal erythema.  NECK: Supple. There is no jugular venous distention. No bruits, no lymphadenopathy, no thyromegaly.  HEART: Regular rate and rhythm,. No murmurs, no rubs, no clicks.  LUNGS: Clear to auscultation bilaterally. No rales or rhonchi. No wheezes.  ABDOMEN: Soft, flat, nontender, nondistended. Has good bowel sounds. No hepatosplenomegaly appreciated.  EXTREMITIES: No  evidence of any cyanosis, clubbing, or peripheral edema.  +2 pedal and radial pulses bilaterally.  NEUROLOGIC: The patient is alert, awake, and oriented x3 with no focal motor or sensory deficits appreciated bilaterally.  SKIN: Moist and warm with no rashes appreciated.  Psych: Not anxious, depressed LN: No inguinal LN enlargement    Antibiotics   Anti-infectives (From admission, onward)   Start     Dose/Rate Route Frequency Ordered Stop   09/02/18 0000  vancomycin (VANCOCIN) 50 mg/mL oral solution 125 mg     125 mg Oral Every 6 hours 09/01/18 2337 09/05/18 2359   09/01/18 2200  piperacillin-tazobactam (ZOSYN) IVPB 3.375 g     3.375 g 12.5 mL/hr over 240 Minutes Intravenous Every 8 hours 09/01/18 2030     09/01/18 2200  ceFEPIme (MAXIPIME) 2 g in sodium chloride 0.9 % 100 mL IVPB  Status:  Discontinued     2 g 200 mL/hr over 30 Minutes Intravenous Every 12 hours 09/01/18 1510 09/01/18 2102   09/01/18 2000  vancomycin (VANCOCIN) 1,500 mg in sodium chloride 0.9 % 500 mL IVPB  Status:  Discontinued     1,500 mg 250 mL/hr over 120 Minutes Intravenous Every 24 hours 09/01/18 1510 09/02/18 1043   09/01/18 1245  ceFEPIme (MAXIPIME) 2 g in sodium chloride 0.9 % 100 mL IVPB     2 g 200 mL/hr over 30 Minutes Intravenous  Once 09/01/18 1230 09/01/18 1310    09/01/18 1245  metroNIDAZOLE (FLAGYL) IVPB 500 mg  Status:  Discontinued     500 mg 100 mL/hr over 60 Minutes Intravenous Every 8 hours 09/01/18 1230 09/01/18 2030   09/01/18 1245  vancomycin (VANCOCIN) IVPB 1000 mg/200 mL premix     1,000 mg 200 mL/hr over 60 Minutes Intravenous  Once 09/01/18 1230 09/01/18 1621      Medications   Scheduled Meds: . acetaminophen  1,000 mg Oral Q6H  . enoxaparin (LOVENOX) injection  40 mg Subcutaneous Q24H  . gabapentin  400 mg Oral BID  . mometasone-formoterol  2 puff Inhalation BID  . pantoprazole (PROTONIX) IV  40 mg Intravenous QHS  . sodium chloride flush  5 mL Intravenous Q12H  . vancomycin  125 mg Oral Q6H   Continuous Infusions: . sodium chloride    . piperacillin-tazobactam (ZOSYN)  IV 3.375 g (09/02/18 0706)   PRN Meds:.sodium chloride, ipratropium-albuterol, [COMPLETED] ketorolac **FOLLOWED BY** ketorolac, metoprolol tartrate, ondansetron **OR** ondansetron (ZOFRAN) IV, oxyCODONE   Data Review:   Micro Results Recent Results (from the past 240 hour(s))  Urine culture     Status: Abnormal (Preliminary result)   Collection Time: 09/01/18 11:15 AM  Result Value Ref Range Status   Specimen Description   Final    URINE, RANDOM Performed at [facility], [address] [place], [place] [address]    Special Requests   Final    NONE Performed at [facility], [address], [place], [place] [address]    Culture (A)  Final    60,000 COLONIES/mL ESCHERICHIA COLI SUSCEPTIBILITIES TO FOLLOW Performed at [facility], [place] [address] [place], [place] [address]    Report Status PENDING  Incomplete  Blood Culture (routine x 2)     Status: None (Preliminary result)   Collection Time: 09/01/18 12:30 PM  Result Value Ref Range Status   Specimen Description BLOOD LEFT HAND  Final   Special Requests   Final    BOTTLES DRAWN AEROBIC AND ANAEROBIC Blood Culture adequate volume  Culture   Final    NO GROWTH < 24  HOURS Performed at [facility], [address] [place], [place] [address]    Report Status PENDING  Incomplete  Blood Culture (routine x 2)     Status: None (Preliminary result)   Collection Time: 09/01/18 12:30 PM  Result Value Ref Range Status   Specimen Description BLOOD LEFT ARM  Final   Special Requests   Final    BOTTLES DRAWN AEROBIC AND ANAEROBIC Blood Culture adequate volume   Culture   Final    NO GROWTH < 24 HOURS Performed at [facility], [address], [place], [place] [address]    Report Status PENDING  Incomplete    Radiology Reports Dg Chest 2 View  Result Date: 09/01/2018 CLINICAL DATA:  Tachycardia. EXAM: CHEST - 2 VIEW COMPARISON:  Radiograph of July 06, 2018. FINDINGS: The heart size and mediastinal contours are within normal limits. Both lungs are clear. No pneumothorax or pleural effusion is noted. The visualized skeletal structures are unremarkable. IMPRESSION: No active cardiopulmonary disease. Electronically Signed   By: Marijo Conception, M.D.   On: 09/01/2018 10:57   Ct Abdomen Pelvis W Contrast  Result Date: 09/01/2018 CLINICAL DATA:  Initial evaluation for acute generalized abdominal pain. EXAM: CT ABDOMEN AND PELVIS WITH CONTRAST TECHNIQUE: Multidetector CT imaging of the abdomen and pelvis was performed using the standard protocol following bolus administration of intravenous contrast. CONTRAST:  119mL OMNIPAQUE IOHEXOL 300 MG/ML  SOLN COMPARISON:  Comparison made with prior CT from 07/30/2018. FINDINGS: Lower chest: Scattered subsegmental atelectatic changes seen dependently within visualized lung bases. Visualized lungs are otherwise clear. Hepatobiliary: Liver demonstrates a normal contrast enhanced appearance. Gallbladder within normal limits. No biliary dilatation. Pancreas: Pancreas within normal limits. Spleen: Spleen within normal limits. Adrenals/Urinary Tract: Bilateral adrenal masses measuring 5.7 cm on the left and 2.6 cm on  the right, stable. Kidneys equal in size with symmetric enhancement. No nephrolithiasis, hydronephrosis, or focal enhancing renal mass. No hydroureter. Partially distended bladder within normal limits. Stomach/Bowel: Stomach within normal limits. No evidence for bowel obstruction. Postoperative changes from prior partial colectomy with recent colostomy takedown and parastomal hernia repair within the right lower quadrant. Fat stranding within the anterior right lower quadrant and along the undersurface of the right abdominal wall just inferior to the surgical anastomosis, mildly worsened from previous. There are a few small foci of extraluminal gas within this region, consistent with free air (series 2, image 56, 57). Hazy inflammatory stranding within the overlying right abdominal wall. Irregular loculated collection within the overlying subcutaneous fat measures 3.3 x 2.9 x 4.2 cm (series 2, image 62). Surrounding fat stranding. This has increased from previous. No other intra-abdominal collections. No other acute inflammatory changes about the bowels. Mild colonic diverticulosis without evidence for acute diverticulitis. Moderate retained stool within the colon, which could reflect constipation. Vascular/Lymphatic: In veins aorto bi-iliac atherosclerotic disease. No aneurysm. Mesenteric vessels patent proximally. No pathologically enlarged intra-abdominopelvic lymph nodes. Reproductive: Uterus and ovaries within normal limits. Other: No other significant free intraperitoneal air. No free fluid. Diastasis of the rectus abdominis musculature with additional fat containing paraumbilical hernia noted. No associated inflammation. Postoperative stranding noted within the subcutaneous fat of the left ventral abdomen as well. Musculoskeletal: No acute osseous abnormality. No discrete lytic or blastic osseous lesions. Chronic compression deformity involving the T12 vertebral body, stable. IM fixation rod partially  visualize within the left femur. IMPRESSION: 1. Increased inflammatory fat stranding with a few  small locules of extraluminal free air within the right lower quadrant adjacent to the surgical anastomosis, concerning for possible anastomotic leak. 3.3 x 2.9 x 4.2 cm collection within subcutaneous fat of the overlying right ventral abdominal wall, worsened from previous. 2. No evidence for bowel obstruction status post partial colectomy and recent colostomy takedown. 3. Moderate retained stool within the residual colon, suggesting constipation. 4. Unchanged bilateral adrenal masses. 5. Advanced aorto bi-iliac atherosclerotic disease. Critical Value/emergent results were called by telephone at the time of interpretation on 09/01/2018 at 1:38 pm to Dr. Carrie Mew , who verbally acknowledged these results. Electronically Signed   By: Jeannine Boga M.D.   On: 09/01/2018 13:41   [place] Image Guided Drainage By Percutaneous Catheter  Result Date: 09/02/2018 INDICATION: 72 year old with a superficial abscess along the right lower abdominal wall at previous ostomy takedown site. Patient presents for ultrasound-guided aspiration and drainage. EXAM: ULTRASOUND-GUIDED DRAIN PLACEMENT WITHIN A SUBCUTANEOUS ABDOMINAL WALL ABSCESS MEDICATIONS: None ANESTHESIA/SEDATION: None COMPLICATIONS: None immediate. PROCEDURE: Informed written consent was obtained from the patient after a thorough discussion of the procedural risks, benefits and alternatives. All questions were addressed. A timeout was performed prior to the initiation of the procedure. Superficial fluid collection in the right lower abdomen was identified with ultrasound. The overlying skin was prepped with chlorhexidine and sterile field was created. Skin was anesthetized with 1% lidocaine. Yueh catheter was directed into the fluid collection with ultrasound guidance and purulent looking fluid was aspirated. A stiff Amplatz wire was advanced through the catheter  and the tract was dilated to accommodate a 10.2 [place] multipurpose drain. The fluid collection was decompressed with the drain. Drain was sutured to skin and attached to a suction bulb. Dressing was placed over the tube site. FINDINGS: Irregular subcutaneous fluid collection in the right lower abdomen. 20 mL of gray colored purulent fluid was removed. The fluid collection was decompressed following drain placement. IMPRESSION: Successful placement of a drainage catheter within the subcutaneous abscess in the right lower abdomen with ultrasound guidance. Fluid was sent for culture. Electronically Signed   By: Markus Daft M.D.   On: 09/02/2018 11:44     CBC Recent Labs  Lab 09/01/18 1003 09/02/18 0440  WBC 18.7* 17.9*  HGB 12.8 11.3*  HCT 39.6 36.4  PLT 502* 353  MCV 90.4 93.3  MCH 29.2 29.0  MCHC 32.3 31.0  RDW 15.5 15.7*    Chemistries  Recent Labs  Lab 09/01/18 1003 09/02/18 0440  NA 134* 138  K 4.0 3.6  CL 99 107  CO2 27 26  GLUCOSE 106* 113*  BUN 13 8  CREATININE 0.65 0.55  CALCIUM 8.6* 8.0*  AST 13*  --   ALT 9  --   ALKPHOS 99  --   BILITOT 0.4  --    ------------------------------------------------------------------------------------------------------------------ estimated creatinine clearance is 63.6 mL/min (by C-G formula based on SCr of 0.55 mg/dL). ------------------------------------------------------------------------------------------------------------------ No results for input(s): HGBA1C in the last 72 hours. ------------------------------------------------------------------------------------------------------------------ No results for input(s): CHOL, HDL, LDLCALC, TRIG, CHOLHDL, LDLDIRECT in the last 72 hours. ------------------------------------------------------------------------------------------------------------------ Recent Labs    09/01/18 1003  TSH 2.854    ------------------------------------------------------------------------------------------------------------------ No results for input(s): VITAMINB12, FOLATE, FERRITIN, TIBC, IRON, RETICCTPCT in the last 72 hours.  Coagulation profile No results for input(s): INR, PROTIME in the last 168 hours.  No results for input(s): DDIMER in the last 72 hours.  Cardiac Enzymes Recent Labs  Lab 09/01/18 1003  TROPONINI <0.03   ------------------------------------------------------------------------------------------------------------------ Invalid input(s): POCBNP  Assessment & Plan   IMPRESSION AND PLAN: Patient is a 72 year old being admitted by surgery for abdominal pain and intra-abdominal abscess  1.  COPD currently stable we will continue her inhalers add nebulizers as needed  2.  GERD continue IV Pepcid for now  3.  Seasonal allergies resume Singulair   4.  Hyperlipidemia resume Pravachol   5.  Recent history of C. difficile colitis diarrhea is resolved o continue oral Vancomycin 6.  Miscellaneous recommend DVT prophylaxis with either heparin or Lovenox      Code Status Orders  (From admission, onward)         Start     Ordered   09/01/18 2031  Full code  Continuous     09/01/18 2030        Code Status History    Date Active Date Inactive Code Status Order ID Comments User Context   07/31/2018 0444 08/01/2018 1903 Full Code [id number]  Harrie Foreman, MD Inpatient   06/25/2018 2009 07/13/2018 1848 Full Code [id number]  Jules Husbands, MD Inpatient   12/01/2017 1437 12/05/2017 1955 Full Code [id number]  Nestor Lewandowsky, MD Inpatient   04/15/2017 0317 04/19/2017 1822 Full Code [id number]  Harrie Foreman, MD Inpatient                Lab Results  Component Value Date   PLT 353 09/02/2018     Time Spent in minutes  34min Greater than 50% of time spent in care coordination and counseling patient regarding the condition and plan of care.   Dustin Flock M.D on 09/02/2018 at 2:56 PM  Between 7am to 6pm - Pager - [phone]  After 6pm go to [email or web address] - Proofreader  [employer]   Office  [phone]

## 2018-09-03 LAB — BASIC METABOLIC PANEL
Anion gap: 8 (ref 5–15)
BUN: 8 mg/dL (ref 8–23)
CHLORIDE: 105 mmol/L (ref 98–111)
CO2: 28 mmol/L (ref 22–32)
Calcium: 8 mg/dL — ABNORMAL LOW (ref 8.9–10.3)
Creatinine, Ser: 0.65 mg/dL (ref 0.44–1.00)
GFR calc non Af Amer: >60 mL/min (ref >60)
Glucose, Bld: 99 mg/dL (ref 70–99)
Potassium: 3.5 mmol/L (ref 3.5–5.1)
Sodium: 141 mmol/L (ref 135–145)

## 2018-09-03 LAB — CBC
HEMATOCRIT: 34.2 % — AB (ref 36.0–46.0)
Hemoglobin: 10.7 g/dL — ABNORMAL LOW (ref 12.0–15.0)
MCH: 28.8 pg (ref 26.0–34.0)
MCHC: 31.3 g/dL (ref 30.0–36.0)
MCV: 92.2 fL (ref 80.0–100.0)
NRBC: 0 % (ref 0.0–0.2)
Platelets: 352 10*3/uL (ref 150–400)
RBC: 3.71 MIL/uL — ABNORMAL LOW (ref 3.87–5.11)
RDW: 15.2 % (ref 11.5–15.5)
WBC: 8.9 10*3/uL (ref 4.0–10.5)

## 2018-09-03 LAB — URINE CULTURE: Culture: 60000 — AB

## 2018-09-03 MED ORDER — PANTOPRAZOLE SODIUM 40 MG PO TBEC
40.0000 mg | DELAYED_RELEASE_TABLET | Freq: Every day | ORAL | Status: DC
  Administered 2018-09-03: 40 mg via ORAL
  Filled 2018-09-03: qty 1

## 2018-09-03 MED ORDER — VANCOMYCIN 50 MG/ML ORAL SOLUTION
125.0000 mg | Freq: Four times a day (QID) | ORAL | Status: DC
  Administered 2018-09-03 – 2018-09-04 (×3): 125 mg via ORAL
  Filled 2018-09-03 (×5): qty 2.5

## 2018-09-03 NOTE — Progress Notes (Signed)
[place] at [facility]                                                                                                                                                                                  Patient Demographics   Kimberly [name], is a 72 y.o. female, DOB - 1945/10/17, WRU:[id number]  Admit date - 09/01/2018   Admitting Physician Jules Husbands, MD  Outpatient Primary MD for the patient is Fisher, Kirstie Peri, MD   LOS - 2  Subjective: Patient currently denying any symptoms    Review of Systems:   CONSTITUTIONAL: No documented fever. No fatigue, weakness. No weight gain, no weight loss.  EYES: No blurry or double vision.  ENT: No tinnitus. No postnasal drip. No redness of the oropharynx.  RESPIRATORY: No cough, no wheeze, no hemoptysis. No dyspnea.  CARDIOVASCULAR: No chest pain. No orthopnea. No palpitations. No syncope.  GASTROINTESTINAL: No nausea, no vomiting or diarrhea. No abdominal pain. No melena or hematochezia.  GENITOURINARY: No dysuria or hematuria.  ENDOCRINE: No polyuria or nocturia. No heat or cold intolerance.  HEMATOLOGY: No anemia. No bruising. No bleeding.  INTEGUMENTARY: No rashes. No lesions.  MUSCULOSKELETAL: No arthritis. No swelling. No gout.  NEUROLOGIC: No numbness, tingling, or ataxia. No seizure-type activity.  PSYCHIATRIC: No anxiety. No insomnia. No ADD.    Vitals:   Vitals:   09/02/18 1028 09/02/18 1555 09/02/18 2122 09/03/18 0415  BP: (!) 146/67 134/78 118/66 (!) 126/54  Pulse: 72 79 72 63  Resp: 18 18 20 20   Temp:  98.4 F (36.9 C) 98 F (36.7 C) 97.8 F (36.6 C)  TempSrc:  Oral Oral Oral  SpO2: 94% 96% 96% 97%  Weight:      Height:        Wt Readings from Last 3 Encounters:  09/01/18 70.8 kg  09/01/18 70.8 kg  08/21/18 70.3 kg     Intake/Output Summary (Last 24 hours) at 09/03/2018 1330 Last data filed at 09/03/2018 1309 Gross per 24 hour  Intake 262.72 ml  Output 2065 ml  Net -1802.28 ml     Physical Exam:   GENERAL: Pleasant-appearing in no apparent distress.  HEAD, EYES, EARS, NOSE AND THROAT: Atraumatic, normocephalic. Extraocular muscles are intact. Pupils equal and reactive to light. Sclerae anicteric. No conjunctival injection. No oro-pharyngeal erythema.  NECK: Supple. There is no jugular venous distention. No bruits, no lymphadenopathy, no thyromegaly.  HEART: Regular rate and rhythm,. No murmurs, no rubs, no clicks.  LUNGS: Clear to auscultation bilaterally. No rales or rhonchi. No wheezes.  ABDOMEN: Soft, flat, nontender, nondistended. Has good bowel sounds. No hepatosplenomegaly appreciated.  EXTREMITIES: No evidence of any  cyanosis, clubbing, or peripheral edema.  +2 pedal and radial pulses bilaterally.  NEUROLOGIC: The patient is alert, awake, and oriented x3 with no focal motor or sensory deficits appreciated bilaterally.  SKIN: Moist and warm with no rashes appreciated.  Psych: Not anxious, depressed LN: No inguinal LN enlargement    Antibiotics   Anti-infectives (From admission, onward)   Start     Dose/Rate Route Frequency Ordered Stop   09/02/18 0000  vancomycin (VANCOCIN) 50 mg/mL oral solution 125 mg     125 mg Oral Every 6 hours 09/01/18 2337 09/05/18 2359   09/01/18 2200  piperacillin-tazobactam (ZOSYN) IVPB 3.375 g     3.375 g 12.5 mL/hr over 240 Minutes Intravenous Every 8 hours 09/01/18 2030     09/01/18 2200  ceFEPIme (MAXIPIME) 2 g in sodium chloride 0.9 % 100 mL IVPB  Status:  Discontinued     2 g 200 mL/hr over 30 Minutes Intravenous Every 12 hours 09/01/18 1510 09/01/18 2102   09/01/18 2000  vancomycin (VANCOCIN) 1,500 mg in sodium chloride 0.9 % 500 mL IVPB  Status:  Discontinued     1,500 mg 250 mL/hr over 120 Minutes Intravenous Every 24 hours 09/01/18 1510 09/02/18 1043   09/01/18 1245  ceFEPIme (MAXIPIME) 2 g in sodium chloride 0.9 % 100 mL IVPB     2 g 200 mL/hr over 30 Minutes Intravenous  Once 09/01/18 1230 09/01/18 1310    09/01/18 1245  metroNIDAZOLE (FLAGYL) IVPB 500 mg  Status:  Discontinued     500 mg 100 mL/hr over 60 Minutes Intravenous Every 8 hours 09/01/18 1230 09/01/18 2030   09/01/18 1245  vancomycin (VANCOCIN) IVPB 1000 mg/200 mL premix     1,000 mg 200 mL/hr over 60 Minutes Intravenous  Once 09/01/18 1230 09/01/18 1621      Medications   Scheduled Meds: . acetaminophen  1,000 mg Oral Q6H  . cholecalciferol  1,000 Units Oral Daily  . enoxaparin (LOVENOX) injection  40 mg Subcutaneous Q24H  . gabapentin  400 mg Oral BID  . mometasone-formoterol  2 puff Inhalation BID  . montelukast  10 mg Oral Daily  . pantoprazole (PROTONIX) IV  40 mg Intravenous QHS  . pravastatin  40 mg Oral Daily  . sodium chloride flush  5 mL Intravenous Q12H  . vancomycin  125 mg Oral Q6H   Continuous Infusions: . sodium chloride Stopped (09/03/18 0346)  . piperacillin-tazobactam (ZOSYN)  IV 3.375 g (09/03/18 0552)   PRN Meds:.sodium chloride, ipratropium-albuterol, [COMPLETED] ketorolac **FOLLOWED BY** ketorolac, metoprolol tartrate, ondansetron **OR** ondansetron (ZOFRAN) IV, oxyCODONE   Data Review:   Micro Results Recent Results (from the past 240 hour(s))  Urine culture     Status: Abnormal   Collection Time: 09/01/18 11:15 AM  Result Value Ref Range Status   Specimen Description   Final    URINE, RANDOM Performed at [facility], [address] [place], [place] [address]    Special Requests   Final    NONE Performed at [facility], [place], [place] [address]    Culture 60,000 COLONIES/mL ESCHERICHIA COLI (A)  Final   Report Status 09/03/2018 FINAL  Final   Organism ID, Bacteria ESCHERICHIA COLI (A)  Final      Susceptibility   Escherichia coli - MIC*    AMPICILLIN >=32 RESISTANT Resistant     CEFAZOLIN >=64 RESISTANT Resistant     CEFTRIAXONE <=1 SENSITIVE Sensitive     CIPROFLOXACIN <=0.25 SENSITIVE Sensitive  GENTAMICIN <=1 SENSITIVE Sensitive      IMIPENEM <=0.25 SENSITIVE Sensitive     NITROFURANTOIN <=16 SENSITIVE Sensitive     TRIMETH/SULFA <=20 SENSITIVE Sensitive     AMPICILLIN/SULBACTAM >=32 RESISTANT Resistant     PIP/TAZO <=4 SENSITIVE Sensitive     Extended ESBL NEGATIVE Sensitive     * 60,000 COLONIES/mL ESCHERICHIA COLI  Blood Culture (routine x 2)     Status: None (Preliminary result)   Collection Time: 09/01/18 12:30 PM  Result Value Ref Range Status   Specimen Description BLOOD LEFT HAND  Final   Special Requests   Final    BOTTLES DRAWN AEROBIC AND ANAEROBIC Blood Culture adequate volume   Culture   Final    NO GROWTH 2 DAYS Performed at [facility], [address] [place], [place] [address]    Report Status PENDING  Incomplete  Blood Culture (routine x 2)     Status: None (Preliminary result)   Collection Time: 09/01/18 12:30 PM  Result Value Ref Range Status   Specimen Description BLOOD LEFT ARM  Final   Special Requests   Final    BOTTLES DRAWN AEROBIC AND ANAEROBIC Blood Culture adequate volume   Culture   Final    NO GROWTH 2 DAYS Performed at [facility], [address], [place], [place] [address]    Report Status PENDING  Incomplete  Aerobic/Anaerobic Culture (surgical/deep wound)     Status: None (Preliminary result)   Collection Time: 09/02/18 11:00 AM  Result Value Ref Range Status   Specimen Description   Final    ABSCESS Performed at [facility], [address] [place], [place] [address]    Special Requests   Final    ABDOMINAL WALL ABSCESS Performed at [facility], [address] [place], [place] [address]    Gram Stain   Final    ABUNDANT WBC PRESENT, PREDOMINANTLY PMN FEW GRAM NEGATIVE RODS RARE GRAM POSITIVE RODS RARE GRAM POSITIVE COCCI IN CHAINS Performed at [facility], [place] [address], [place],  [address]    Culture FEW ESCHERICHIA COLI  Final   Report Status PENDING  Incomplete    Radiology Reports Dg Chest 2  View  Result Date: 09/01/2018 CLINICAL DATA:  Tachycardia. EXAM: CHEST - 2 VIEW COMPARISON:  Radiograph of July 06, 2018. FINDINGS: The heart size and mediastinal contours are within normal limits. Both lungs are clear. No pneumothorax or pleural effusion is noted. The visualized skeletal structures are unremarkable. IMPRESSION: No active cardiopulmonary disease. Electronically Signed   By: Marijo Conception, M.D.   On: 09/01/2018 10:57   Ct Abdomen Pelvis W Contrast  Result Date: 09/01/2018 CLINICAL DATA:  Initial evaluation for acute generalized abdominal pain. EXAM: CT ABDOMEN AND PELVIS WITH CONTRAST TECHNIQUE: Multidetector CT imaging of the abdomen and pelvis was performed using the standard protocol following bolus administration of intravenous contrast. CONTRAST:  175mL OMNIPAQUE IOHEXOL 300 MG/ML  SOLN COMPARISON:  Comparison made with prior CT from 07/30/2018. FINDINGS: Lower chest: Scattered subsegmental atelectatic changes seen dependently within visualized lung bases. Visualized lungs are otherwise clear. Hepatobiliary: Liver demonstrates a normal contrast enhanced appearance. Gallbladder within normal limits. No biliary dilatation. Pancreas: Pancreas within normal limits. Spleen: Spleen within normal limits. Adrenals/Urinary Tract: Bilateral adrenal masses measuring 5.7 cm on the left and 2.6 cm on the right, stable. Kidneys equal in size with symmetric enhancement. No nephrolithiasis, hydronephrosis, or focal enhancing renal mass. No hydroureter. Partially distended bladder within normal  limits. Stomach/Bowel: Stomach within normal limits. No evidence for bowel obstruction. Postoperative changes from prior partial colectomy with recent colostomy takedown and parastomal hernia repair within the right lower quadrant. Fat stranding within the anterior right lower quadrant and along the undersurface of the right abdominal wall just inferior to the surgical anastomosis, mildly worsened from previous.  There are a few small foci of extraluminal gas within this region, consistent with free air (series 2, image 56, 57). Hazy inflammatory stranding within the overlying right abdominal wall. Irregular loculated collection within the overlying subcutaneous fat measures 3.3 x 2.9 x 4.2 cm (series 2, image 62). Surrounding fat stranding. This has increased from previous. No other intra-abdominal collections. No other acute inflammatory changes about the bowels. Mild colonic diverticulosis without evidence for acute diverticulitis. Moderate retained stool within the colon, which could reflect constipation. Vascular/Lymphatic: In veins aorto bi-iliac atherosclerotic disease. No aneurysm. Mesenteric vessels patent proximally. No pathologically enlarged intra-abdominopelvic lymph nodes. Reproductive: Uterus and ovaries within normal limits. Other: No other significant free intraperitoneal air. No free fluid. Diastasis of the rectus abdominis musculature with additional fat containing paraumbilical hernia noted. No associated inflammation. Postoperative stranding noted within the subcutaneous fat of the left ventral abdomen as well. Musculoskeletal: No acute osseous abnormality. No discrete lytic or blastic osseous lesions. Chronic compression deformity involving the T12 vertebral body, stable. IM fixation rod partially visualize within the left femur. IMPRESSION: 1. Increased inflammatory fat stranding with a few small locules of extraluminal free air within the right lower quadrant adjacent to the surgical anastomosis, concerning for possible anastomotic leak. 3.3 x 2.9 x 4.2 cm collection within subcutaneous fat of the overlying right ventral abdominal wall, worsened from previous. 2. No evidence for bowel obstruction status post partial colectomy and recent colostomy takedown. 3. Moderate retained stool within the residual colon, suggesting constipation. 4. Unchanged bilateral adrenal masses. 5. Advanced aorto bi-iliac  atherosclerotic disease. Critical Value/emergent results were called by telephone at the time of interpretation on 09/01/2018 at 1:38 pm to Dr. Carrie Mew , who verbally acknowledged these results. Electronically Signed   By: Jeannine Boga M.D.   On: 09/01/2018 13:41   [place] Image Guided Drainage By Percutaneous Catheter  Result Date: 09/02/2018 INDICATION: 72 year old with a superficial abscess along the right lower abdominal wall at previous ostomy takedown site. Patient presents for ultrasound-guided aspiration and drainage. EXAM: ULTRASOUND-GUIDED DRAIN PLACEMENT WITHIN A SUBCUTANEOUS ABDOMINAL WALL ABSCESS MEDICATIONS: None ANESTHESIA/SEDATION: None COMPLICATIONS: None immediate. PROCEDURE: Informed written consent was obtained from the patient after a thorough discussion of the procedural risks, benefits and alternatives. All questions were addressed. A timeout was performed prior to the initiation of the procedure. Superficial fluid collection in the right lower abdomen was identified with ultrasound. The overlying skin was prepped with chlorhexidine and sterile field was created. Skin was anesthetized with 1% lidocaine. Yueh catheter was directed into the fluid collection with ultrasound guidance and purulent looking fluid was aspirated. A stiff Amplatz wire was advanced through the catheter and the tract was dilated to accommodate a 10.2 [place] multipurpose drain. The fluid collection was decompressed with the drain. Drain was sutured to skin and attached to a suction bulb. Dressing was placed over the tube site. FINDINGS: Irregular subcutaneous fluid collection in the right lower abdomen. 20 mL of gray colored purulent fluid was removed. The fluid collection was decompressed following drain placement. IMPRESSION: Successful placement of a drainage catheter within the subcutaneous abscess in the right lower abdomen with ultrasound guidance. Fluid was sent  for culture. Electronically Signed    By: Markus Daft M.D.   On: 09/02/2018 11:44     CBC Recent Labs  Lab 09/01/18 1003 09/02/18 0440 09/03/18 0632  WBC 18.7* 17.9* 8.9  HGB 12.8 11.3* 10.7*  HCT 39.6 36.4 34.2*  PLT 502* 353 352  MCV 90.4 93.3 92.2  MCH 29.2 29.0 28.8  MCHC 32.3 31.0 31.3  RDW 15.5 15.7* 15.2    Chemistries  Recent Labs  Lab 09/01/18 1003 09/02/18 0440 09/03/18 0632  NA 134* 138 141  K 4.0 3.6 3.5  CL 99 107 105  CO2 27 26 28   GLUCOSE 106* 113* 99  BUN 13 8 8   CREATININE 0.65 0.55 0.65  CALCIUM 8.6* 8.0* 8.0*  AST 13*  --   --   ALT 9  --   --   ALKPHOS 99  --   --   BILITOT 0.4  --   --    ------------------------------------------------------------------------------------------------------------------ estimated creatinine clearance is 63.6 mL/min (by C-G formula based on SCr of 0.65 mg/dL). ------------------------------------------------------------------------------------------------------------------ No results for input(s): HGBA1C in the last 72 hours. ------------------------------------------------------------------------------------------------------------------ No results for input(s): CHOL, HDL, LDLCALC, TRIG, CHOLHDL, LDLDIRECT in the last 72 hours. ------------------------------------------------------------------------------------------------------------------ Recent Labs    09/01/18 1003  TSH 2.854   ------------------------------------------------------------------------------------------------------------------ No results for input(s): VITAMINB12, FOLATE, FERRITIN, TIBC, IRON, RETICCTPCT in the last 72 hours.  Coagulation profile No results for input(s): INR, PROTIME in the last 168 hours.  No results for input(s): DDIMER in the last 72 hours.  Cardiac Enzymes Recent Labs  Lab 09/01/18 1003  TROPONINI <0.03   ------------------------------------------------------------------------------------------------------------------ Invalid input(s):  POCBNP    Assessment & Plan   IMPRESSION AND PLAN: Patient is a 72 year old being admitted by surgery for abdominal pain and intra-abdominal abscess  1.  COPD currently stable we will continue her inhalers continue nebulizers as needed  2.  GERD continue change to oral PPI as before  3.  Seasonal allergies resume Singulair   4.  Hyperlipidemia resume Pravachol   5.  Recent history of C. difficile colitis diarrhea is resolved o continue oral Vancomycin 6.  Miscellaneous recommend DVT prophylaxis with either heparin or Lovenox      Code Status Orders  (From admission, onward)         Start     Ordered   09/01/18 2031  Full code  Continuous     09/01/18 2030        Code Status History    Date Active Date Inactive Code Status Order ID Comments User Context   07/31/2018 0444 08/01/2018 1903 Full Code [id number]  Harrie Foreman, MD Inpatient   06/25/2018 2009 07/13/2018 1848 Full Code [id number]  Jules Husbands, MD Inpatient   12/01/2017 1437 12/05/2017 1955 Full Code [id number]  Nestor Lewandowsky, MD Inpatient   04/15/2017 0317 04/19/2017 1822 Full Code [id number]  Harrie Foreman, MD Inpatient                Lab Results  Component Value Date   PLT 352 09/03/2018     Time Spent in minutes  25min Greater than 50% of time spent in care coordination and counseling patient regarding the condition and plan of care.   Dustin Flock M.D on 09/03/2018 at 1:30 PM  Between 7am to 6pm - Pager - [phone]  After 6pm go to [email or web address] - Proofreader  [employer]   Office  [phone]

## 2018-09-03 NOTE — Progress Notes (Signed)
[facility] Progress Note     Subjective: No acute events overnight. She denied any abdominal pain, nausea, emesis and and she has remained afebrile. Has been tolerating a diet and mobilizing well. Daughter yesterday raised concern over why she has been getting so many infections including wound infections and C diff in last few months.   Objective: Vital signs in last 24 hours: Temp:  [97.8 F (36.6 C)-98.4 F (36.9 C)] 97.8 F (36.6 C) (12/05 0415) Pulse Rate:  [63-79] 63 (12/05 0415) Resp:  [18-20] 20 (12/05 0415) BP: (118-146)/(54-78) 126/54 (12/05 0415) SpO2:  [94 %-97 %] 97 % (12/05 0415) Last BM Date: 09/02/18  Intake/Output from previous day: 12/04 0701 - 12/05 0700 In: 257.7 [P.O.:118; I.V.:40.6; IV Piggyback:99.1] Out: 3545 [Urine:1500; Drains:35] Intake/Output this shift: Total I/O In: -  Out: 320 [Urine:300; Drains:20]  PE: Gen:  Alert, NAD, pleasant Pulm:  Normal effort Abd: Soft, non-tender, non-distended, improved induration, erythema over RLQ incision. JP in RLQ with purulent fluid in tubing and bulb Skin: warm and dry, no rashes  Psych: A&Ox3   Lab Results:  Recent Labs    09/02/18 0440 09/03/18 0632  WBC 17.9* 8.9  HGB 11.3* 10.7*  HCT 36.4 34.2*  PLT 353 352   BMET Recent Labs    09/02/18 0440 09/03/18 0632  NA 138 141  K 3.6 3.5  CL 107 105  CO2 26 28  GLUCOSE 113* 99  BUN 8 8  CREATININE 0.55 0.65  CALCIUM 8.0* 8.0*   PT/INR No results for input(s): LABPROT, INR in the last 72 hours. CMP     Component Value Date/Time   NA 141 09/03/2018 0632   NA 141 01/02/2017 1410   NA 136 01/27/2012 0637   K 3.5 09/03/2018 0632   K 4.3 01/27/2012 0637   CL 105 09/03/2018 0632   CL 102 01/27/2012 0637   CO2 28 09/03/2018 0632   CO2 25 01/27/2012 0637   GLUCOSE 99 09/03/2018 0632   GLUCOSE 119 (H) 01/27/2012 0637   BUN 8 09/03/2018 0632   BUN 14 01/02/2017 1410   BUN 15 01/27/2012 0637   CREATININE 0.65 09/03/2018  0632   CREATININE 0.67 01/27/2012 0637   CALCIUM 8.0 (L) 09/03/2018 0632   CALCIUM 7.7 (L) 01/27/2012 0637   PROT 6.8 09/01/2018 1003   PROT 7.0 01/02/2017 1410   PROT 7.6 01/26/2012 0021   ALBUMIN 2.8 (L) 09/01/2018 1003   ALBUMIN 3.9 01/02/2017 1410   ALBUMIN 3.5 01/26/2012 0021   AST 13 (L) 09/01/2018 1003   AST 17 01/26/2012 0021   ALT 9 09/01/2018 1003   ALT 19 01/26/2012 0021   ALKPHOS 99 09/01/2018 1003   ALKPHOS 87 01/26/2012 0021   BILITOT 0.4 09/01/2018 1003   BILITOT <0.2 01/02/2017 1410   BILITOT 0.2 01/26/2012 0021   GFRNONAA >60 09/03/2018 0632   GFRNONAA >60 01/27/2012 0637   GFRAA >60 09/03/2018 0632   GFRAA >60 01/27/2012 0637   Lipase     Component Value Date/Time   LIPASE 22 09/01/2018 1003       Studies/Results: Dg Chest 2 View  Result Date: 09/01/2018 CLINICAL DATA:  Tachycardia. EXAM: CHEST - 2 VIEW COMPARISON:  Radiograph of July 06, 2018. FINDINGS: The heart size and mediastinal contours are within normal limits. Both lungs are clear. No pneumothorax or pleural effusion is noted. The visualized skeletal structures are unremarkable. IMPRESSION: No active cardiopulmonary disease. Electronically Signed   By: Marijo Conception, M.D.  On: 09/01/2018 10:57   Ct Abdomen Pelvis W Contrast  Result Date: 09/01/2018 CLINICAL DATA:  Initial evaluation for acute generalized abdominal pain. EXAM: CT ABDOMEN AND PELVIS WITH CONTRAST TECHNIQUE: Multidetector CT imaging of the abdomen and pelvis was performed using the standard protocol following bolus administration of intravenous contrast. CONTRAST:  161mL OMNIPAQUE IOHEXOL 300 MG/ML  SOLN COMPARISON:  Comparison made with prior CT from 07/30/2018. FINDINGS: Lower chest: Scattered subsegmental atelectatic changes seen dependently within visualized lung bases. Visualized lungs are otherwise clear. Hepatobiliary: Liver demonstrates a normal contrast enhanced appearance. Gallbladder within normal limits. No biliary  dilatation. Pancreas: Pancreas within normal limits. Spleen: Spleen within normal limits. Adrenals/Urinary Tract: Bilateral adrenal masses measuring 5.7 cm on the left and 2.6 cm on the right, stable. Kidneys equal in size with symmetric enhancement. No nephrolithiasis, hydronephrosis, or focal enhancing renal mass. No hydroureter. Partially distended bladder within normal limits. Stomach/Bowel: Stomach within normal limits. No evidence for bowel obstruction. Postoperative changes from prior partial colectomy with recent colostomy takedown and parastomal hernia repair within the right lower quadrant. Fat stranding within the anterior right lower quadrant and along the undersurface of the right abdominal wall just inferior to the surgical anastomosis, mildly worsened from previous. There are a few small foci of extraluminal gas within this region, consistent with free air (series 2, image 56, 57). Hazy inflammatory stranding within the overlying right abdominal wall. Irregular loculated collection within the overlying subcutaneous fat measures 3.3 x 2.9 x 4.2 cm (series 2, image 62). Surrounding fat stranding. This has increased from previous. No other intra-abdominal collections. No other acute inflammatory changes about the bowels. Mild colonic diverticulosis without evidence for acute diverticulitis. Moderate retained stool within the colon, which could reflect constipation. Vascular/Lymphatic: In veins aorto bi-iliac atherosclerotic disease. No aneurysm. Mesenteric vessels patent proximally. No pathologically enlarged intra-abdominopelvic lymph nodes. Reproductive: Uterus and ovaries within normal limits. Other: No other significant free intraperitoneal air. No free fluid. Diastasis of the rectus abdominis musculature with additional fat containing paraumbilical hernia noted. No associated inflammation. Postoperative stranding noted within the subcutaneous fat of the left ventral abdomen as well. Musculoskeletal:  No acute osseous abnormality. No discrete lytic or blastic osseous lesions. Chronic compression deformity involving the T12 vertebral body, stable. IM fixation rod partially visualize within the left femur. IMPRESSION: 1. Increased inflammatory fat stranding with a few small locules of extraluminal free air within the right lower quadrant adjacent to the surgical anastomosis, concerning for possible anastomotic leak. 3.3 x 2.9 x 4.2 cm collection within subcutaneous fat of the overlying right ventral abdominal wall, worsened from previous. 2. No evidence for bowel obstruction status post partial colectomy and recent colostomy takedown. 3. Moderate retained stool within the residual colon, suggesting constipation. 4. Unchanged bilateral adrenal masses. 5. Advanced aorto bi-iliac atherosclerotic disease. Critical Value/emergent results were called by telephone at the time of interpretation on 09/01/2018 at 1:38 pm to Dr. Carrie Mew , who verbally acknowledged these results. Electronically Signed   By: Jeannine Boga M.D.   On: 09/01/2018 13:41   [place] Image Guided Drainage By Percutaneous Catheter  Result Date: 09/02/2018 INDICATION: 72 year old with a superficial abscess along the right lower abdominal wall at previous ostomy takedown site. Patient presents for ultrasound-guided aspiration and drainage. EXAM: ULTRASOUND-GUIDED DRAIN PLACEMENT WITHIN A SUBCUTANEOUS ABDOMINAL WALL ABSCESS MEDICATIONS: None ANESTHESIA/SEDATION: None COMPLICATIONS: None immediate. PROCEDURE: Informed written consent was obtained from the patient after a thorough discussion of the procedural risks, benefits and alternatives. All questions were addressed. A  timeout was performed prior to the initiation of the procedure. Superficial fluid collection in the right lower abdomen was identified with ultrasound. The overlying skin was prepped with chlorhexidine and sterile field was created. Skin was anesthetized with 1% lidocaine.  Yueh catheter was directed into the fluid collection with ultrasound guidance and purulent looking fluid was aspirated. A stiff Amplatz wire was advanced through the catheter and the tract was dilated to accommodate a 10.2 [place] multipurpose drain. The fluid collection was decompressed with the drain. Drain was sutured to skin and attached to a suction bulb. Dressing was placed over the tube site. FINDINGS: Irregular subcutaneous fluid collection in the right lower abdomen. 20 mL of gray colored purulent fluid was removed. The fluid collection was decompressed following drain placement. IMPRESSION: Successful placement of a drainage catheter within the subcutaneous abscess in the right lower abdomen with ultrasound guidance. Fluid was sent for culture. Electronically Signed   By: Markus Daft M.D.   On: 09/02/2018 11:44    Anti-infectives: Anti-infectives (From admission, onward)   Start     Dose/Rate Route Frequency Ordered Stop   09/02/18 0000  vancomycin (VANCOCIN) 50 mg/mL oral solution 125 mg     125 mg Oral Every 6 hours 09/01/18 2337 09/05/18 2359   09/01/18 2200  piperacillin-tazobactam (ZOSYN) IVPB 3.375 g     3.375 g 12.5 mL/hr over 240 Minutes Intravenous Every 8 hours 09/01/18 2030     09/01/18 2200  ceFEPIme (MAXIPIME) 2 g in sodium chloride 0.9 % 100 mL IVPB  Status:  Discontinued     2 g 200 mL/hr over 30 Minutes Intravenous Every 12 hours 09/01/18 1510 09/01/18 2102   09/01/18 2000  vancomycin (VANCOCIN) 1,500 mg in sodium chloride 0.9 % 500 mL IVPB  Status:  Discontinued     1,500 mg 250 mL/hr over 120 Minutes Intravenous Every 24 hours 09/01/18 1510 09/02/18 1043   09/01/18 1245  ceFEPIme (MAXIPIME) 2 g in sodium chloride 0.9 % 100 mL IVPB     2 g 200 mL/hr over 30 Minutes Intravenous  Once 09/01/18 1230 09/01/18 1310   09/01/18 1245  metroNIDAZOLE (FLAGYL) IVPB 500 mg  Status:  Discontinued     500 mg 100 mL/hr over 60 Minutes Intravenous Every 8 hours 09/01/18 1230 09/01/18  2030   09/01/18 1245  vancomycin (VANCOCIN) IVPB 1000 mg/200 mL premix     1,000 mg 200 mL/hr over 60 Minutes Intravenous  Once 09/01/18 1230 09/01/18 1621       Assessment/Plan  Abdominal wall abscess Alianah B [name] a72 y.o.femalewho is overall doing well with an abdominal wall abscess who is 2 months s/p ileostomy takedown which is further complicated by pertinent co-morbidities including COPD on home O2, GERD, HLD, Osteoarthritis, advanced age, and tobacco abuse (smoking).     - Continue regular diet   - Continue IV Abx today (Zosyn), cultures pending   - Monitor abdominal examination   - Given concern over recurrent infections in the last few months, will consult ID for further evaluation and possible recommendations   - Medical management of comorbidities, appreciate medicines help                         - DVT prophylaxis  -- Edison Simon , PA-C [facility] 09/03/2018, 8:17 AM [phone] M-F: 7am - 4pm

## 2018-09-03 NOTE — Consult Note (Signed)
Infectious Disease     Reason for Consult: Abdominal wall abscess   Referring Physician: Tama High Date of Admission:  09/01/2018   Active Problems:   Postprocedural intraabdominal abscess   HPI: Kimberly [name] is a 72 y.o. female with an abdominal wall abscess following colostomy takedown in September.  She was apparently in her usual state of health until a few days prior to admission when developed abd pain and fevers. She has hx of colon cancer, recent C difficile infection, currently on second course of oral vancomycin. On admit wbc was 18 and CT showed abdominal wall abscess with drain placed by IR 12/4 with 20 cc of gray purulent fluid obtained. Cx is growing E coli but gram stain is mixed.  Past Medical History:  Diagnosis Date  . Breast cancer, left (HCC)    Lumpectomy and rad tx's.  . Chronic back pain   . Colon cancer ([place])   . COPD (chronic obstructive pulmonary disease) ([place])   . Degenerative disc disease, lumbar 04/2013  . Dyspnea   . Family history of adverse reaction to anesthesia    son arrested after anesthesia  . GERD (gastroesophageal reflux disease)   . Hyperlipidemia   . Iron deficiency anemia 05/27/2017  . Neuropathy   . Osteoarthritis    of knee  . Osteoporosis   . Small bowel obstruction ([place]) 04/16/2017  . Tobacco abuse   . Vitamin D deficiency    Past Surgical History:  Procedure Laterality Date  . APPENDECTOMY  1965  . BREAST EXCISIONAL BIOPSY Left 08/01/2003   lumpectomy rad 11/04-2/28/2005  . BREAST SURGERY    . CESAREAN SECTION     x3  . COLON SURGERY    . COLONOSCOPY W/ POLYPECTOMY    . COLONOSCOPY WITH PROPOFOL N/A 04/09/2018   Procedure: COLONOSCOPY WITH PROPOFOL;  Surgeon: Virgel Manifold, MD;  Location: [facility];  Service: Gastroenterology;  Laterality: N/A;  . COLOSTOMY TAKEDOWN N/A 06/25/2018   Procedure: COLOSTOMY TAKEDOWN;  Surgeon: Jules Husbands, MD;  Location: [facility];  Service: General;  Laterality: N/A;  .  ESOPHAGOGASTRODUODENOSCOPY (EGD) WITH PROPOFOL N/A 04/04/2017   Procedure: ESOPHAGOGASTRODUODENOSCOPY (EGD) WITH PROPOFOL;  Surgeon: [name], MD;  Location: [place];  Service: Endoscopy;  Laterality: N/A;  . FRACTURE SURGERY    . HIP FRACTURE SURGERY Left 01/25/2012  . JOINT REPLACEMENT    . LAPAROTOMY N/A 04/15/2017   Procedure: EXPLORATORY LAPAROTOMY;  Surgeon: Clayburn Pert, MD;  Location: [facility];  Service: General;  Laterality: N/A;  . NECK SURGERY  12/2011  . PARASTOMAL HERNIA REPAIR N/A 12/03/2017   Procedure: HERNIA REPAIR PARASTOMAL;  Surgeon: Jules Husbands, MD;  Location: [facility];  Service: General;  Laterality: N/A;  . PARASTOMAL HERNIA REPAIR N/A 06/25/2018   Procedure: HERNIA REPAIR PARASTOMAL;  Surgeon: Jules Husbands, MD;  Location: [facility];  Service: General;  Laterality: N/A;  . PORTACATH PLACEMENT Right 05/21/2017   Procedure: INSERTION PORT-A-CATH;  Surgeon: Clayburn Pert, MD;  Location: [facility];  Service: General;  Laterality: Right;  . WRIST FRACTURE SURGERY     Social History   Tobacco Use  . Smoking status: Former Smoker    Packs/day: 0.25    Years: 55.00    Pack years: 13.75    Types: Cigarettes    Last attempt to quit: 05/21/2017    Years since quitting: 1.2  . Smokeless tobacco: Never Used  . Tobacco comment: started age 63 1/2 to 1 ppd  Substance Use  Topics  . Alcohol use: Yes    Alcohol/week: 0.0 standard drinks    Comment: occasionally drinks beer  . Drug use: No   Family History  Problem Relation Age of Onset  . Diabetes Mother        type 2  . Coronary artery disease Mother   . Deep vein thrombosis Mother   . Colon cancer Father   . Asthma Brother   . Diabetes Brother        type 2    Allergies:  Allergies  Allergen Reactions  . Betadine [Povidone Iodine] Other (See Comments)    Topical iodine she states "if you put it in an open sore it will cause a eating ulcer."  . Other Nausea And Vomiting    Antihystamines  .  Sinus Formula [Cholestatin] Nausea Only    Current antibiotics: Antibiotics Given (last 72 hours)    Date/Time Action Medication Dose Rate   09/01/18 1240 New Bag/Given   ceFEPIme (MAXIPIME) 2 g in sodium chloride 0.9 % 100 mL IVPB 2 g 200 mL/hr   09/01/18 1315 New Bag/Given   metroNIDAZOLE (FLAGYL) IVPB 500 mg 500 mg 100 mL/hr   09/01/18 1416 New Bag/Given   vancomycin (VANCOCIN) IVPB 1000 mg/200 mL premix 1,000 mg 200 mL/hr   09/01/18 2159 New Bag/Given   piperacillin-tazobactam (ZOSYN) IVPB 3.375 g 3.375 g 12.5 mL/hr   09/01/18 2343 New Bag/Given   vancomycin (VANCOCIN) 1,500 mg in sodium chloride 0.9 % 500 mL IVPB 1,500 mg 250 mL/hr   09/02/18 0706 New Bag/Given   piperacillin-tazobactam (ZOSYN) IVPB 3.375 g 3.375 g 12.5 mL/hr   09/02/18 1200 Given   vancomycin (VANCOCIN) 50 mg/mL oral solution 125 mg 125 mg    09/02/18 1540 New Bag/Given   piperacillin-tazobactam (ZOSYN) IVPB 3.375 g 3.375 g 12.5 mL/hr   09/02/18 1808 Given   vancomycin (VANCOCIN) 50 mg/mL oral solution 125 mg 125 mg    09/02/18 2113 New Bag/Given   piperacillin-tazobactam (ZOSYN) IVPB 3.375 g 3.375 g 12.5 mL/hr   09/02/18 2348 Given   vancomycin (VANCOCIN) 50 mg/mL oral solution 125 mg 125 mg    09/03/18 0551 Given   vancomycin (VANCOCIN) 50 mg/mL oral solution 125 mg 125 mg    09/03/18 0552 New Bag/Given   piperacillin-tazobactam (ZOSYN) IVPB 3.375 g 3.375 g 12.5 mL/hr      MEDICATIONS: . acetaminophen  1,000 mg Oral Q6H  . cholecalciferol  1,000 Units Oral Daily  . enoxaparin (LOVENOX) injection  40 mg Subcutaneous Q24H  . gabapentin  400 mg Oral BID  . mometasone-formoterol  2 puff Inhalation BID  . montelukast  10 mg Oral Daily  . pantoprazole (PROTONIX) IV  40 mg Intravenous QHS  . pravastatin  40 mg Oral Daily  . sodium chloride flush  5 mL Intravenous Q12H  . vancomycin  125 mg Oral Q6H    Review of Systems - 11 systems reviewed and negative per HPI   OBJECTIVE: Temp:  [97.8 F (36.6  C)-98.4 F (36.9 C)] 97.8 F (36.6 C) (12/05 0415) Pulse Rate:  [63-79] 63 (12/05 0415) Resp:  [18-20] 20 (12/05 0415) BP: (118-134)/(54-78) 126/54 (12/05 0415) SpO2:  [96 %-97 %] 97 % (12/05 0415) Physical Exam  Constitutional:  oriented to person, place, and time. appears well-developed and well-nourished. No distress.  HENT: [place]/AT, PERRLA, no scleral icterus Mouth/Throat: Oropharynx is clear and moist. No oropharyngeal exudate.  Cardiovascular: Normal rate, regular rhythm and normal heart sounds.  Pulmonary/Chest: Effort normal and  breath sounds normal. No respiratory distress.  has no wheezes.  Neck = supple, no nuchal rigidity Abdominal: Soft. Bowel sounds are normal.  Right lower quadrant drain in place with green drainage.  Mild tenderness to palpation and some induration around the site of drain entry.   Lymphadenopathy: no cervical adenopathy. No axillary adenopathy Neurological: alert and oriented to person, place, and time.  Skin: Skin is warm and dry. No rash noted. No erythema.  Psychiatric: a normal mood and affect.  behavior is normal.    LABS: Results for orders placed or performed during the hospital encounter of 09/01/18 (from the past 48 hour(s))  Urinalysis, Complete w Microscopic     Status: Abnormal   Collection Time: 09/01/18 11:15 AM  Result Value Ref Range   Color, Urine YELLOW (A) YELLOW   APPearance CLOUDY (A) CLEAR   Specific Gravity, Urine 1.010 1.005 - 1.030   pH 6.0 5.0 - 8.0   Glucose, UA NEGATIVE NEGATIVE mg/dL   Hgb urine dipstick NEGATIVE NEGATIVE   Bilirubin Urine NEGATIVE NEGATIVE   Ketones, ur NEGATIVE NEGATIVE mg/dL   Protein, ur NEGATIVE NEGATIVE mg/dL   Nitrite NEGATIVE NEGATIVE   Leukocytes, UA TRACE (A) NEGATIVE   RBC / HPF 0-5 0 - 5 RBC/hpf   WBC, UA 6-10 0 - 5 WBC/hpf   Bacteria, UA NONE SEEN NONE SEEN   Squamous Epithelial / LPF 11-20 0 - 5   Mucus PRESENT     Comment: Performed at [facility], [address]  [place], [place] [address]  Urine culture     Status: Abnormal   Collection Time: 09/01/18 11:15 AM  Result Value Ref Range   Specimen Description      URINE, RANDOM Performed at [facility], [address] [place], [place] [address]    Special Requests      NONE Performed at [facility], [address] [place], [place] [address]    Culture 60,000 COLONIES/mL ESCHERICHIA COLI (A)    Report Status 09/03/2018 FINAL    Organism ID, Bacteria ESCHERICHIA COLI (A)       Susceptibility   Escherichia coli - MIC*    AMPICILLIN >=32 RESISTANT Resistant     CEFAZOLIN >=64 RESISTANT Resistant     CEFTRIAXONE <=1 SENSITIVE Sensitive     CIPROFLOXACIN <=0.25 SENSITIVE Sensitive     GENTAMICIN <=1 SENSITIVE Sensitive     IMIPENEM <=0.25 SENSITIVE Sensitive     NITROFURANTOIN <=16 SENSITIVE Sensitive     TRIMETH/SULFA <=20 SENSITIVE Sensitive     AMPICILLIN/SULBACTAM >=32 RESISTANT Resistant     PIP/TAZO <=4 SENSITIVE Sensitive     Extended ESBL NEGATIVE Sensitive     * 60,000 COLONIES/mL ESCHERICHIA COLI  Blood Culture (routine x 2)     Status: None (Preliminary result)   Collection Time: 09/01/18 12:30 PM  Result Value Ref Range   Specimen Description BLOOD LEFT HAND    Special Requests      BOTTLES DRAWN AEROBIC AND ANAEROBIC Blood Culture adequate volume   Culture      NO GROWTH 2 DAYS Performed at [facility], [address] [place], [place] [address]    Report Status PENDING   Blood Culture (routine x 2)     Status: None (Preliminary result)   Collection Time: 09/01/18 12:30 PM  Result Value Ref Range   Specimen Description BLOOD LEFT ARM    Special Requests      BOTTLES DRAWN AEROBIC AND ANAEROBIC Blood Culture  adequate volume   Culture      NO GROWTH 2 DAYS Performed at [facility], [address] [place], [place] [address]    Report Status PENDING   Lactic acid, plasma     Status: None   Collection Time: 09/01/18 12:49 PM  Result  Value Ref Range   Lactic Acid, Venous 0.8 0.5 - 1.9 mmol/L    Comment: Performed at [facility], [address] [place], [place] [address]  CBC     Status: Abnormal   Collection Time: 09/02/18  4:40 AM  Result Value Ref Range   WBC 17.9 (H) 4.0 - 10.5 K/uL   RBC 3.90 3.87 - 5.11 MIL/uL   Hemoglobin 11.3 (L) 12.0 - 15.0 g/dL   HCT 36.4 36.0 - 46.0 %   MCV 93.3 80.0 - 100.0 fL   MCH 29.0 26.0 - 34.0 pg   MCHC 31.0 30.0 - 36.0 g/dL   RDW 15.7 (H) 11.5 - 15.5 %   Platelets 353 150 - 400 K/uL   nRBC 0.0 0.0 - 0.2 %    Comment: Performed at [facility], [address] [place], [place] [address]  Basic metabolic panel     Status: Abnormal   Collection Time: 09/02/18  4:40 AM  Result Value Ref Range   Sodium 138 135 - 145 mmol/L   Potassium 3.6 3.5 - 5.1 mmol/L   Chloride 107 98 - 111 mmol/L   CO2 26 22 - 32 mmol/L   Glucose, Bld 113 (H) 70 - 99 mg/dL   BUN 8 8 - 23 mg/dL   Creatinine, Ser 0.55 0.44 - 1.00 mg/dL   Calcium 8.0 (L) 8.9 - 10.3 mg/dL   GFR calc non Af Amer >60 >60 mL/min   GFR calc Af Amer >60 >60 mL/min   Anion gap 5 5 - 15    Comment: Performed at [facility], [address], [place], [place] [address]  Aerobic/Anaerobic Culture (surgical/deep wound)     Status: None (Preliminary result)   Collection Time: 09/02/18 11:00 AM  Result Value Ref Range   Specimen Description      ABSCESS Performed at [facility], [address], [place], [place] [address]    Special Requests      ABDOMINAL WALL ABSCESS Performed at [facility], [address] [place], [place] [address]    Gram Stain      ABUNDANT WBC PRESENT, PREDOMINANTLY PMN FEW GRAM NEGATIVE RODS RARE GRAM POSITIVE RODS RARE GRAM POSITIVE COCCI IN CHAINS Performed at [facility], [place] [address] [place], [place] [address]    Culture FEW ESCHERICHIA COLI    Report Status PENDING   CBC     Status: Abnormal   Collection Time: 09/03/18  6:32 AM   Result Value Ref Range   WBC 8.9 4.0 - 10.5 K/uL   RBC 3.71 (L) 3.87 - 5.11 MIL/uL   Hemoglobin 10.7 (L) 12.0 - 15.0 g/dL   HCT 34.2 (L) 36.0 - 46.0 %   MCV 92.2 80.0 - 100.0 fL   MCH 28.8 26.0 - 34.0 pg   MCHC 31.3 30.0 - 36.0 g/dL   RDW 15.2 11.5 - 15.5 %   Platelets 352 150 - 400 K/uL   nRBC 0.0 0.0 - 0.2 %    Comment: Performed at [facility], [address], [place], [place] [address]  Basic metabolic panel     Status: Abnormal   Collection Time: 09/03/18  6:32 AM  Result Value Ref Range   Sodium 141 135 - 145 mmol/L   Potassium 3.5 3.5 - 5.1 mmol/L   Chloride 105 98 - 111 mmol/L   CO2 28 22 - 32 mmol/L   Glucose, Bld 99 70 - 99 mg/dL   BUN 8 8 - 23 mg/dL   Creatinine, Ser 0.65 0.44 - 1.00 mg/dL   Calcium 8.0 (L) 8.9 - 10.3 mg/dL   GFR calc non Af Amer >60 >60 mL/min   GFR calc Af Amer >60 >60 mL/min   Anion gap 8 5 - 15    Comment: Performed at [facility], [address] [place], [place] [address]   No components found for: ESR, C REACTIVE PROTEIN MICRO: Recent Results (from the past 720 hour(s))  C difficile Toxins A+B W/Rflx     Status: None   Collection Time: 08/21/18  2:00 PM  Result Value Ref Range Status   C difficile Toxins A+B, EIA Negative Negative Final  Clostridium Difficile by PCR     Status: Abnormal   Collection Time: 08/21/18  2:00 PM  Result Value Ref Range Status   Toxigenic C. Difficile by PCR Positive (A) Negative Final    Comment: Toxigenic C difficile: Positive Epidemic Strain Bl/NAP1/027: Presumptive Negative   C difficile, Cytotoxin B     Status: None   Collection Time: 08/21/18  2:00 PM  Result Value Ref Range Status   C diff Toxin B Final report  Final    Comment:                             Reference Range: Negative   Result 1 Comment  Final    Comment: Negative No cytotoxin detected.   Urine culture     Status: Abnormal   Collection Time: 09/01/18 11:15 AM  Result Value Ref Range Status   Specimen  Description   Final    URINE, RANDOM Performed at [facility], [address] [place], [place] [address]    Special Requests   Final    NONE Performed at [facility], [place], [place] [address]    Culture 60,000 COLONIES/mL ESCHERICHIA COLI (A)  Final   Report Status 09/03/2018 FINAL  Final   Organism ID, Bacteria ESCHERICHIA COLI (A)  Final      Susceptibility   Escherichia coli - MIC*    AMPICILLIN >=32 RESISTANT Resistant     CEFAZOLIN >=64 RESISTANT Resistant     CEFTRIAXONE <=1 SENSITIVE Sensitive     CIPROFLOXACIN <=0.25 SENSITIVE Sensitive     GENTAMICIN <=1 SENSITIVE Sensitive     IMIPENEM <=0.25 SENSITIVE Sensitive     NITROFURANTOIN <=16 SENSITIVE Sensitive     TRIMETH/SULFA <=20 SENSITIVE Sensitive     AMPICILLIN/SULBACTAM >=32 RESISTANT Resistant     PIP/TAZO <=4 SENSITIVE Sensitive     Extended ESBL NEGATIVE Sensitive     * 60,000 COLONIES/mL ESCHERICHIA COLI  Blood Culture (routine x 2)     Status: None (Preliminary result)   Collection Time: 09/01/18 12:30 PM  Result Value Ref Range Status   Specimen Description BLOOD LEFT HAND  Final   Special Requests   Final    BOTTLES DRAWN AEROBIC AND ANAEROBIC Blood Culture adequate volume   Culture   Final    NO GROWTH 2 DAYS Performed at [facility], [address], [place], [place] [address]    Report Status  PENDING  Incomplete  Blood Culture (routine x 2)     Status: None (Preliminary result)   Collection Time: 09/01/18 12:30 PM  Result Value Ref Range Status   Specimen Description BLOOD LEFT ARM  Final   Special Requests   Final    BOTTLES DRAWN AEROBIC AND ANAEROBIC Blood Culture adequate volume   Culture   Final    NO GROWTH 2 DAYS Performed at [facility], [address], [place], [place] [address]    Report Status PENDING  Incomplete  Aerobic/Anaerobic Culture (surgical/deep wound)     Status: None (Preliminary result)   Collection Time: 09/02/18  11:00 AM  Result Value Ref Range Status   Specimen Description   Final    ABSCESS Performed at [facility], [address], [place], [place] [address]    Special Requests   Final    ABDOMINAL WALL ABSCESS Performed at [facility], [address] [place], [place] [address]    Gram Stain   Final    ABUNDANT WBC PRESENT, PREDOMINANTLY PMN FEW GRAM NEGATIVE RODS RARE GRAM POSITIVE RODS RARE GRAM POSITIVE COCCI IN CHAINS Performed at [facility], [place] [address], [place], [place] [address]    Culture FEW ESCHERICHIA COLI  Final   Report Status PENDING  Incomplete    IMAGING: Dg Chest 2 View  Result Date: 09/01/2018 CLINICAL DATA:  Tachycardia. EXAM: CHEST - 2 VIEW COMPARISON:  Radiograph of July 06, 2018. FINDINGS: The heart size and mediastinal contours are within normal limits. Both lungs are clear. No pneumothorax or pleural effusion is noted. The visualized skeletal structures are unremarkable. IMPRESSION: No active cardiopulmonary disease. Electronically Signed   By: Marijo Conception, M.D.   On: 09/01/2018 10:57   Ct Abdomen Pelvis W Contrast  Result Date: 09/01/2018 CLINICAL DATA:  Initial evaluation for acute generalized abdominal pain. EXAM: CT ABDOMEN AND PELVIS WITH CONTRAST TECHNIQUE: Multidetector CT imaging of the abdomen and pelvis was performed using the standard protocol following bolus administration of intravenous contrast. CONTRAST:  167m OMNIPAQUE IOHEXOL 300 MG/ML  SOLN COMPARISON:  Comparison made with prior CT from 07/30/2018. FINDINGS: Lower chest: Scattered subsegmental atelectatic changes seen dependently within visualized lung bases. Visualized lungs are otherwise clear. Hepatobiliary: Liver demonstrates a normal contrast enhanced appearance. Gallbladder within normal limits. No biliary dilatation. Pancreas: Pancreas within normal limits. Spleen: Spleen within normal limits. Adrenals/Urinary Tract: Bilateral adrenal masses measuring 5.7  cm on the left and 2.6 cm on the right, stable. Kidneys equal in size with symmetric enhancement. No nephrolithiasis, hydronephrosis, or focal enhancing renal mass. No hydroureter. Partially distended bladder within normal limits. Stomach/Bowel: Stomach within normal limits. No evidence for bowel obstruction. Postoperative changes from prior partial colectomy with recent colostomy takedown and parastomal hernia repair within the right lower quadrant. Fat stranding within the anterior right lower quadrant and along the undersurface of the right abdominal wall just inferior to the surgical anastomosis, mildly worsened from previous. There are a few small foci of extraluminal gas within this region, consistent with free air (series 2, image 56, 57). Hazy inflammatory stranding within the overlying right abdominal wall. Irregular loculated collection within the overlying subcutaneous fat measures 3.3 x 2.9 x 4.2 cm (series 2, image 62). Surrounding fat stranding. This has increased from previous. No other intra-abdominal collections. No other acute inflammatory changes about the bowels. Mild colonic diverticulosis without evidence for acute diverticulitis. Moderate retained stool within the colon, which could reflect constipation. Vascular/Lymphatic: In veins  aorto bi-iliac atherosclerotic disease. No aneurysm. Mesenteric vessels patent proximally. No pathologically enlarged intra-abdominopelvic lymph nodes. Reproductive: Uterus and ovaries within normal limits. Other: No other significant free intraperitoneal air. No free fluid. Diastasis of the rectus abdominis musculature with additional fat containing paraumbilical hernia noted. No associated inflammation. Postoperative stranding noted within the subcutaneous fat of the left ventral abdomen as well. Musculoskeletal: No acute osseous abnormality. No discrete lytic or blastic osseous lesions. Chronic compression deformity involving the T12 vertebral body, stable. IM  fixation rod partially visualize within the left femur. IMPRESSION: 1. Increased inflammatory fat stranding with a few small locules of extraluminal free air within the right lower quadrant adjacent to the surgical anastomosis, concerning for possible anastomotic leak. 3.3 x 2.9 x 4.2 cm collection within subcutaneous fat of the overlying right ventral abdominal wall, worsened from previous. 2. No evidence for bowel obstruction status post partial colectomy and recent colostomy takedown. 3. Moderate retained stool within the residual colon, suggesting constipation. 4. Unchanged bilateral adrenal masses. 5. Advanced aorto bi-iliac atherosclerotic disease. Critical Value/emergent results were called by telephone at the time of interpretation on 09/01/2018 at 1:38 pm to Dr. Carrie Mew , who verbally acknowledged these results. Electronically Signed   By: Jeannine Boga M.D.   On: 09/01/2018 13:41   [place] Image Guided Drainage By Percutaneous Catheter  Result Date: 09/02/2018 INDICATION: 72 year old with a superficial abscess along the right lower abdominal wall at previous ostomy takedown site. Patient presents for ultrasound-guided aspiration and drainage. EXAM: ULTRASOUND-GUIDED DRAIN PLACEMENT WITHIN A SUBCUTANEOUS ABDOMINAL WALL ABSCESS MEDICATIONS: None ANESTHESIA/SEDATION: None COMPLICATIONS: None immediate. PROCEDURE: Informed written consent was obtained from the patient after a thorough discussion of the procedural risks, benefits and alternatives. All questions were addressed. A timeout was performed prior to the initiation of the procedure. Superficial fluid collection in the right lower abdomen was identified with ultrasound. The overlying skin was prepped with chlorhexidine and sterile field was created. Skin was anesthetized with 1% lidocaine. Yueh catheter was directed into the fluid collection with ultrasound guidance and purulent looking fluid was aspirated. A stiff Amplatz wire was  advanced through the catheter and the tract was dilated to accommodate a 10.2 [place] multipurpose drain. The fluid collection was decompressed with the drain. Drain was sutured to skin and attached to a suction bulb. Dressing was placed over the tube site. FINDINGS: Irregular subcutaneous fluid collection in the right lower abdomen. 20 mL of gray colored purulent fluid was removed. The fluid collection was decompressed following drain placement. IMPRESSION: Successful placement of a drainage catheter within the subcutaneous abscess in the right lower abdomen with ultrasound guidance. Fluid was sent for culture. Electronically Signed   By: Markus Daft M.D.   On: 09/02/2018 11:44    Assessment:   Sheilah B Kanner is a 72 y.o. female with a history of colon cancer prior Port-A-Cath infection and recent surgery now with an abdominal abscess in the right lower quadrant abdominal wall.  She is status post drainage on December 4.  Culture is growing E. coli but Gram stain is mixed.  Currently on oral vanco and IV zosyn.  She has a history of recent C. difficile.  Afebrile and wbc down to 8.  Recommendations Discussed that she will certainly need antibiotics directed towards her abdominal abscess.  Hopefully this will be able to be treated with oral antibiotics such as Augmentin.  For now I would continue the Zosyn pending culture results.  Drain management per surgery.  Would suggest continuing  oral vancomycin while she is on other antibiotics for the abdominal wall abscess.  Can be tapered off once she is done.  Thank you very much for allowing me to participate in the care of this patient. Please call with questions.   Cheral Marker. Ola Spurr, MD

## 2018-09-04 ENCOUNTER — Telehealth: Payer: Self-pay | Admitting: *Deleted

## 2018-09-04 ENCOUNTER — Other Ambulatory Visit: Payer: Self-pay | Admitting: Physician Assistant

## 2018-09-04 MED ORDER — CIPROFLOXACIN HCL 500 MG PO TABS: 500.0000 mg | ORAL_TABLET | Freq: Two times a day (BID) | ORAL | 0 refills | Status: AC

## 2018-09-04 MED ORDER — VANCOMYCIN HCL 125 MG PO CAPS: 125.0000 mg | ORAL_CAPSULE | Freq: Four times a day (QID) | ORAL | 1 refills | Status: DC

## 2018-09-04 MED ORDER — AMOXICILLIN-POT CLAVULANATE 875-125 MG PO TABS: 1.0000 | ORAL_TABLET | Freq: Two times a day (BID) | ORAL | 0 refills | Status: AC

## 2018-09-04 MED ORDER — METRONIDAZOLE 500 MG PO TABS: 500.0000 mg | ORAL_TABLET | Freq: Three times a day (TID) | ORAL | 0 refills | Status: AC

## 2018-09-04 NOTE — Discharge Summary (Signed)
Discharge Summary  Patient ID: Kimberly [name] MRN: [id number] DOB/AGE: 1946/04/21 72 y.o.  Admit date: 09/01/2018 Discharge date: 09/04/2018  Discharge Diagnoses Abdominal Wall Abscess  Consultants Medicine  Procedures IR Percutaneous Drain Placement   HPI: Kimberly [name] is 72 y.o. female who presents to [facility] ED this afternoon with complaints of abdominal pain. She reports that on Sunday she had acute onset of sharp RLQ abdominal pain and fever to as high as 101. She went to her PCP this afternoon for this and was also found to be tachycardic and new onset atrial fibrillation on EKG, which was worrisome for infectious process and patient presented to ED for further work up. She denied any chest pain, SOB, palpitations, nausea, or emesis. Of note, this patient is well known to our practice and she is 2 months status post ileostomy takedown. Work up in the ED was concerning for leukocytosis and intra-abdominal abscess.   Hospital Course: upon admission and review of imaging there was no concern for anastomotic leak but rather for an abdominal wall abscess near her RLQ abdominal incision from her most recent surgery. Interventional radiology was consulted and the patient had a percutaneous drain placed on 12/04 which revealed purulent fluid. He pain improved following this procedure and advancement of diet was tolerated without difficulty. Infectious Disease was consulted given her history of multiple infections over the last few months and recommendations were provided. The remainder of patient's hospital course was essentially unremarkable, and discharge planning was initiated accordingly with patient safely able to be discharged home with appropriate discharge instructions, antibiotics, pain control, and outpatient follow-up after all of her and her family's questions were answered to their expressed satisfaction.  Discharge Condition: Good   Physical Examination:  Gen: Alert, NAD,  pleasant Pulm: Normal effort Abd: Soft, non-tender, non-distended,improved induration, erythema over RLQ incision. JP in RLQ with purulent fluid in tubing and bulb Skin: warm and dry, no rashes  Psych: A&Ox3     Allergies as of 09/04/2018      Reactions   Betadine [povidone Iodine] Other (See Comments)   Topical iodine she states "if you put it in an open sore it will cause a eating ulcer."   Other Nausea And Vomiting   Antihystamines   Sinus Formula [cholestatin] Nausea Only      Medication List    TAKE these medications   amoxicillin-clavulanate 875-125 MG tablet Commonly known as:  AUGMENTIN Take 1 tablet by mouth 2 (two) times daily for 10 days.   budesonide-formoterol 80-4.5 MCG/ACT inhaler Commonly known as:  SYMBICORT Inhale 2 puffs into the lungs 2 (two) times daily.   cholecalciferol 1000 units tablet Commonly known as:  VITAMIN D Take 1,000 Units by mouth daily.   feeding supplement Liqd Take 1 Container by mouth 3 (three) times daily between meals.   gabapentin 400 MG capsule Commonly known as:  NEURONTIN TAKE 1 CAPSULE TWICE DAILY   HYDROcodone-acetaminophen 7.5-325 MG tablet Commonly known as:  NORCO Take 1-2 tablets by mouth every 6 (six) hours as needed for moderate pain.   ibuprofen 200 MG tablet Commonly known as:  ADVIL,MOTRIN Take 400 mg by mouth daily as needed for headache or moderate pain.   meloxicam 15 MG tablet Commonly known as:  MOBIC TAKE 1 TABLET EVERY DAY AS NEEDED   montelukast 10 MG tablet Commonly known as:  SINGULAIR Take 1 tablet (10 mg total) by mouth daily.   NASACORT ALLERGY 24HR NA Place 2 sprays into the nose daily.  omeprazole 20 MG capsule Commonly known as:  PRILOSEC TAKE 1 CAPSULE EVERY DAY   ondansetron 4 MG tablet Commonly known as:  ZOFRAN Take 0.5-1 tablets (2-4 mg total) by mouth every 8 (eight) hours as needed for nausea or vomiting.   pravastatin 40 MG tablet Commonly known as:  PRAVACHOL TAKE 1  TABLET EVERY DAY   PROBIOTIC DAILY PO Take 1 capsule by mouth daily.   pyridOXINE 50 MG tablet Commonly known as:  VITAMIN B-6 Take 1 tablet (50 mg total) by mouth daily.   vancomycin 125 MG capsule Commonly known as:  VANCOCIN Take 1 capsule (125 mg total) by mouth 4 (four) times daily.   vitamin B-12 1000 MCG tablet Commonly known as:  CYANOCOBALAMIN Take 1,000 mcg by mouth daily.        Follow-up Information    Pabon, [place] F, MD. Schedule an appointment as soon as possible for a visit in 1 week(s).   Specialty:  General Surgery Why:  1 week hospital follow up for abdominal wall abscess, has drain in place Contact information: [address] [place] [address] [phone]           Signed: Edison Simon , PA-C [facility]  09/04/2018, 9:14 AM [phone] M-F: 7am - 4pm

## 2018-09-04 NOTE — Telephone Encounter (Signed)
Patient called in today at 1:49 pm stating she has 6 days of Augmentin left. Zack told her to call office and report . Per patient

## 2018-09-04 NOTE — Progress Notes (Signed)
Brief Note  Patient currently on PO vancomycin for C diff infection. Recently admitted for abdominal wall abscess and seen by infectious disease. Recommends being on PO vancomycin while taking anti-biotics for current infection and tapering off after completion.   Patient called office reporting only having 6 days of vancomycin left, which is not enough to cover her current course of Cipro Flagyl (10 days). Refill sent to pharmacy.   -- Edison Simon, PA-C [facility] 09/04/2018, 3:14 PM [phone] M-F: 7am - 4pm

## 2018-09-04 NOTE — Care Management Note (Signed)
Case Management Note  Patient Details  Name: Kimberly [name] MRN: [id number] Date of Birth: [birth year]   Patient to discharge today with resumption of home health orders through [facility].  Joelene Millin with [facility] notified of discharge.  Per PA patient should have over a week of oral Vanc at home, and informed the patient to call the office to notified them of her current number of doses in the home  Subjective/Objective:                    Action/Plan:   Expected Discharge Date:  09/04/18               Expected Discharge Plan:  [place]  In-House Referral:     Discharge planning Services  CM Consult  Post Acute Care Choice:  Resumption of Svcs/PTA Provider Choice offered to:     DME Arranged:    DME Agency:     HH Arranged:  RN, PT [place] Agency:  [facility]  Status of Service:  Completed, signed off  If discussed at [place] of Stay Meetings, dates discussed:    Additional Comments:  Beverly Sessions, RN 09/04/2018, 10:36 AM

## 2018-09-04 NOTE — Progress Notes (Signed)
09/04/2018 11:31 AM  Deyana B Occhipinti to be D/C'd Home per MD order.  Discussed prescriptions and follow up appointments with the patient. Prescriptions given to patient, medication list explained in detail. Pt verbalized understanding.  Allergies as of 09/04/2018      Reactions   Betadine [povidone Iodine] Other (See Comments)   Topical iodine she states "if you put it in an open sore it will cause a eating ulcer."   Other Nausea And Vomiting   Antihystamines   Sinus Formula [cholestatin] Nausea Only      Medication List    TAKE these medications   amoxicillin-clavulanate 875-125 MG tablet Commonly known as:  AUGMENTIN Take 1 tablet by mouth 2 (two) times daily for 10 days.   budesonide-formoterol 80-4.5 MCG/ACT inhaler Commonly known as:  SYMBICORT Inhale 2 puffs into the lungs 2 (two) times daily.   cholecalciferol 1000 units tablet Commonly known as:  VITAMIN D Take 1,000 Units by mouth daily.   feeding supplement Liqd Take 1 Container by mouth 3 (three) times daily between meals.   gabapentin 400 MG capsule Commonly known as:  NEURONTIN TAKE 1 CAPSULE TWICE DAILY   HYDROcodone-acetaminophen 7.5-325 MG tablet Commonly known as:  NORCO Take 1-2 tablets by mouth every 6 (six) hours as needed for moderate pain.   ibuprofen 200 MG tablet Commonly known as:  ADVIL,MOTRIN Take 400 mg by mouth daily as needed for headache or moderate pain.   meloxicam 15 MG tablet Commonly known as:  MOBIC TAKE 1 TABLET EVERY DAY AS NEEDED   montelukast 10 MG tablet Commonly known as:  SINGULAIR Take 1 tablet (10 mg total) by mouth daily.   NASACORT ALLERGY 24HR NA Place 2 sprays into the nose daily.   omeprazole 20 MG capsule Commonly known as:  PRILOSEC TAKE 1 CAPSULE EVERY DAY   ondansetron 4 MG tablet Commonly known as:  ZOFRAN Take 0.5-1 tablets (2-4 mg total) by mouth every 8 (eight) hours as needed for nausea or vomiting.   pravastatin 40 MG tablet Commonly known as:   PRAVACHOL TAKE 1 TABLET EVERY DAY   PROBIOTIC DAILY PO Take 1 capsule by mouth daily.   pyridOXINE 50 MG tablet Commonly known as:  VITAMIN B-6 Take 1 tablet (50 mg total) by mouth daily.   vancomycin 125 MG capsule Commonly known as:  VANCOCIN Take 1 capsule (125 mg total) by mouth 4 (four) times daily.   vitamin B-12 1000 MCG tablet Commonly known as:  CYANOCOBALAMIN Take 1,000 mcg by mouth daily.       Vitals:   09/03/18 1954 09/04/18 0356  BP: (!) 121/57 135/65  Pulse: 68 64  Resp: 20 20  Temp: 97.8 F (36.6 C) 98.1 F (36.7 C)  SpO2: 97% 96%    Skin clean, dry and intact without evidence of skin break down, no evidence of skin tears noted. IV catheter discontinued intact. Site without signs and symptoms of complications. Dressing and pressure applied. Pt denies pain at this time. No complaints noted.  An After Visit Summary was printed and given to the patient. Patient escorted via [place], and D/C home via private auto.  Kimberly [name]

## 2018-09-04 NOTE — Telephone Encounter (Signed)
Thedore Mins will call in [place]

## 2018-09-04 NOTE — Progress Notes (Addendum)
[place] at [facility]                                                                                                                                                                                  Patient Demographics   Kimberly [name], is a 72 y.o. female, DOB - 04/23/1946, JFH:[id number]  Admit date - 09/01/2018   Admitting Physician Jules Husbands, MD  Outpatient Primary MD for the patient is Birdie Sons, MD   LOS - 3  Subjective: Pt denies any abdominal pain doing well    Review of Systems:   CONSTITUTIONAL: No documented fever. No fatigue, weakness. No weight gain, no weight loss.  EYES: No blurry or double vision.  ENT: No tinnitus. No postnasal drip. No redness of the oropharynx.  RESPIRATORY: No cough, no wheeze, no hemoptysis. No dyspnea.  CARDIOVASCULAR: No chest pain. No orthopnea. No palpitations. No syncope.  GASTROINTESTINAL: No nausea, no vomiting or diarrhea. No abdominal pain. No melena or hematochezia.  GENITOURINARY: No dysuria or hematuria.  ENDOCRINE: No polyuria or nocturia. No heat or cold intolerance.  HEMATOLOGY: No anemia. No bruising. No bleeding.  INTEGUMENTARY: No rashes. No lesions.  MUSCULOSKELETAL: No arthritis. No swelling. No gout.  NEUROLOGIC: No numbness, tingling, or ataxia. No seizure-type activity.  PSYCHIATRIC: No anxiety. No insomnia. No ADD.    Vitals:   Vitals:   09/03/18 0415 09/03/18 1408 09/03/18 1954 09/04/18 0356  BP: (!) 126/54 125/70 (!) 121/57 135/65  Pulse: 63 66 68 64  Resp: 20 19 20 20   Temp: 97.8 F (36.6 C) 98 F (36.7 C) 97.8 F (36.6 C) 98.1 F (36.7 C)  TempSrc: Oral Oral Oral Oral  SpO2: 97% 98% 97% 96%  Weight:      Height:        Wt Readings from Last 3 Encounters:  09/01/18 70.8 kg  09/01/18 70.8 kg  08/21/18 70.3 kg     Intake/Output Summary (Last 24 hours) at 09/04/2018 1147 Last data filed at 09/04/2018 0842 Gross per 24 hour  Intake 40 ml  Output 2480 ml  Net  -2440 ml    Physical Exam:   GENERAL: Pleasant-appearing in no apparent distress.  HEAD, EYES, EARS, NOSE AND THROAT: Atraumatic, normocephalic. Extraocular muscles are intact. Pupils equal and reactive to light. Sclerae anicteric. No conjunctival injection. No oro-pharyngeal erythema.  NECK: Supple. There is no jugular venous distention. No bruits, no lymphadenopathy, no thyromegaly.  HEART: Regular rate and rhythm,. No murmurs, no rubs, no clicks.  LUNGS: Clear to auscultation bilaterally. No rales or rhonchi. No wheezes.  ABDOMEN: Soft, flat, nontender, nondistended. Has good bowel sounds. No hepatosplenomegaly appreciated.  EXTREMITIES: No evidence of any cyanosis, clubbing, or peripheral edema.  +2 pedal and radial pulses bilaterally.  NEUROLOGIC: The patient is alert, awake, and oriented x3 with no focal motor or sensory deficits appreciated bilaterally.  SKIN: Moist and warm with no rashes appreciated.  Psych: Not anxious, depressed LN: No inguinal LN enlargement    Antibiotics   Anti-infectives (From admission, onward)   Start     Dose/Rate Route Frequency Ordered Stop   09/04/18 0000  amoxicillin-clavulanate (AUGMENTIN) 875-125 MG tablet     1 tablet Oral 2 times daily 09/04/18 0751 09/14/18 2359   09/03/18 1800  vancomycin (VANCOCIN) 50 mg/mL oral solution 125 mg     125 mg Oral Every 6 hours 09/03/18 1331     09/02/18 0000  vancomycin (VANCOCIN) 50 mg/mL oral solution 125 mg  Status:  Discontinued     125 mg Oral Every 6 hours 09/01/18 2337 09/03/18 1331   09/01/18 2200  piperacillin-tazobactam (ZOSYN) IVPB 3.375 g     3.375 g 12.5 mL/hr over 240 Minutes Intravenous Every 8 hours 09/01/18 2030     09/01/18 2200  ceFEPIme (MAXIPIME) 2 g in sodium chloride 0.9 % 100 mL IVPB  Status:  Discontinued     2 g 200 mL/hr over 30 Minutes Intravenous Every 12 hours 09/01/18 1510 09/01/18 2102   09/01/18 2000  vancomycin (VANCOCIN) 1,500 mg in sodium chloride 0.9 % 500 mL IVPB   Status:  Discontinued     1,500 mg 250 mL/hr over 120 Minutes Intravenous Every 24 hours 09/01/18 1510 09/02/18 1043   09/01/18 1245  ceFEPIme (MAXIPIME) 2 g in sodium chloride 0.9 % 100 mL IVPB     2 g 200 mL/hr over 30 Minutes Intravenous  Once 09/01/18 1230 09/01/18 1310   09/01/18 1245  metroNIDAZOLE (FLAGYL) IVPB 500 mg  Status:  Discontinued     500 mg 100 mL/hr over 60 Minutes Intravenous Every 8 hours 09/01/18 1230 09/01/18 2030   09/01/18 1245  vancomycin (VANCOCIN) IVPB 1000 mg/200 mL premix     1,000 mg 200 mL/hr over 60 Minutes Intravenous  Once 09/01/18 1230 09/01/18 1621      Medications   Scheduled Meds: . acetaminophen  1,000 mg Oral Q6H  . cholecalciferol  1,000 Units Oral Daily  . enoxaparin (LOVENOX) injection  40 mg Subcutaneous Q24H  . gabapentin  400 mg Oral BID  . mometasone-formoterol  2 puff Inhalation BID  . montelukast  10 mg Oral Daily  . pantoprazole  40 mg Oral QHS  . pravastatin  40 mg Oral Daily  . sodium chloride flush  5 mL Intravenous Q12H  . vancomycin  125 mg Oral Q6H   Continuous Infusions: . sodium chloride 250 mL (09/03/18 1637)  . piperacillin-tazobactam (ZOSYN)  IV 3.375 g (09/04/18 0558)   PRN Meds:.sodium chloride, ipratropium-albuterol, [COMPLETED] ketorolac **FOLLOWED BY** ketorolac, metoprolol tartrate, ondansetron **OR** ondansetron (ZOFRAN) IV, oxyCODONE   Data Review:   Micro Results Recent Results (from the past 240 hour(s))  Urine culture     Status: Abnormal   Collection Time: 09/01/18 11:15 AM  Result Value Ref Range Status   Specimen Description   Final    URINE, RANDOM Performed at [facility], [address], [place], [place] [address]    Special Requests   Final    NONE Performed at [facility], [address], [place], [place] [address]    Culture 60,000 COLONIES/mL ESCHERICHIA COLI (A)  Final   Report Status  09/03/2018 FINAL  Final   Organism ID, Bacteria ESCHERICHIA COLI (A)  Final       Susceptibility   Escherichia coli - MIC*    AMPICILLIN >=32 RESISTANT Resistant     CEFAZOLIN >=64 RESISTANT Resistant     CEFTRIAXONE <=1 SENSITIVE Sensitive     CIPROFLOXACIN <=0.25 SENSITIVE Sensitive     GENTAMICIN <=1 SENSITIVE Sensitive     IMIPENEM <=0.25 SENSITIVE Sensitive     NITROFURANTOIN <=16 SENSITIVE Sensitive     TRIMETH/SULFA <=20 SENSITIVE Sensitive     AMPICILLIN/SULBACTAM >=32 RESISTANT Resistant     PIP/TAZO <=4 SENSITIVE Sensitive     Extended ESBL NEGATIVE Sensitive     * 60,000 COLONIES/mL ESCHERICHIA COLI  Blood Culture (routine x 2)     Status: None (Preliminary result)   Collection Time: 09/01/18 12:30 PM  Result Value Ref Range Status   Specimen Description BLOOD LEFT HAND  Final   Special Requests   Final    BOTTLES DRAWN AEROBIC AND ANAEROBIC Blood Culture adequate volume   Culture   Final    NO GROWTH 3 DAYS Performed at [facility], [address], [place], [place] [address]    Report Status PENDING  Incomplete  Blood Culture (routine x 2)     Status: None (Preliminary result)   Collection Time: 09/01/18 12:30 PM  Result Value Ref Range Status   Specimen Description BLOOD LEFT ARM  Final   Special Requests   Final    BOTTLES DRAWN AEROBIC AND ANAEROBIC Blood Culture adequate volume   Culture   Final    NO GROWTH 3 DAYS Performed at [facility], [address], [place], [place] [address]    Report Status PENDING  Incomplete  Aerobic/Anaerobic Culture (surgical/deep wound)     Status: None (Preliminary result)   Collection Time: 09/02/18 11:00 AM  Result Value Ref Range Status   Specimen Description   Final    ABSCESS Performed at [facility], [address], [place], [place] [address]    Special Requests   Final    ABDOMINAL WALL ABSCESS Performed at [facility], [address] [place], [place] [address]    Gram Stain   Final    ABUNDANT WBC PRESENT, PREDOMINANTLY PMN FEW GRAM NEGATIVE  RODS RARE GRAM POSITIVE RODS RARE GRAM POSITIVE COCCI IN CHAINS    Culture   Final    FEW ESCHERICHIA COLI HOLDING FOR POSSIBLE ANAEROBE Performed at [facility], [place] [address], [place],  [address]    Report Status PENDING  Incomplete   Organism ID, Bacteria ESCHERICHIA COLI  Final      Susceptibility   Escherichia coli - MIC*    AMPICILLIN >=32 RESISTANT Resistant     CEFAZOLIN >=64 RESISTANT Resistant     CEFEPIME <=1 SENSITIVE Sensitive     CEFTAZIDIME <=1 SENSITIVE Sensitive     CEFTRIAXONE <=1 SENSITIVE Sensitive     CIPROFLOXACIN <=0.25 SENSITIVE Sensitive     GENTAMICIN <=1 SENSITIVE Sensitive     IMIPENEM <=0.25 SENSITIVE Sensitive     TRIMETH/SULFA <=20 SENSITIVE Sensitive     AMPICILLIN/SULBACTAM >=32 RESISTANT Resistant     PIP/TAZO <=4 SENSITIVE Sensitive     Extended ESBL NEGATIVE Sensitive     * FEW ESCHERICHIA COLI    Radiology Reports Dg Chest 2 View  Result Date: 09/01/2018 CLINICAL DATA:  Tachycardia. EXAM: CHEST - 2 VIEW COMPARISON:  Radiograph of July 06, 2018. FINDINGS: The heart size  and mediastinal contours are within normal limits. Both lungs are clear. No pneumothorax or pleural effusion is noted. The visualized skeletal structures are unremarkable. IMPRESSION: No active cardiopulmonary disease. Electronically Signed   By: Marijo Conception, M.D.   On: 09/01/2018 10:57   Ct Abdomen Pelvis W Contrast  Result Date: 09/01/2018 CLINICAL DATA:  Initial evaluation for acute generalized abdominal pain. EXAM: CT ABDOMEN AND PELVIS WITH CONTRAST TECHNIQUE: Multidetector CT imaging of the abdomen and pelvis was performed using the standard protocol following bolus administration of intravenous contrast. CONTRAST:  175mL OMNIPAQUE IOHEXOL 300 MG/ML  SOLN COMPARISON:  Comparison made with prior CT from 07/30/2018. FINDINGS: Lower chest: Scattered subsegmental atelectatic changes seen dependently within visualized lung bases. Visualized lungs are  otherwise clear. Hepatobiliary: Liver demonstrates a normal contrast enhanced appearance. Gallbladder within normal limits. No biliary dilatation. Pancreas: Pancreas within normal limits. Spleen: Spleen within normal limits. Adrenals/Urinary Tract: Bilateral adrenal masses measuring 5.7 cm on the left and 2.6 cm on the right, stable. Kidneys equal in size with symmetric enhancement. No nephrolithiasis, hydronephrosis, or focal enhancing renal mass. No hydroureter. Partially distended bladder within normal limits. Stomach/Bowel: Stomach within normal limits. No evidence for bowel obstruction. Postoperative changes from prior partial colectomy with recent colostomy takedown and parastomal hernia repair within the right lower quadrant. Fat stranding within the anterior right lower quadrant and along the undersurface of the right abdominal wall just inferior to the surgical anastomosis, mildly worsened from previous. There are a few small foci of extraluminal gas within this region, consistent with free air (series 2, image 56, 57). Hazy inflammatory stranding within the overlying right abdominal wall. Irregular loculated collection within the overlying subcutaneous fat measures 3.3 x 2.9 x 4.2 cm (series 2, image 62). Surrounding fat stranding. This has increased from previous. No other intra-abdominal collections. No other acute inflammatory changes about the bowels. Mild colonic diverticulosis without evidence for acute diverticulitis. Moderate retained stool within the colon, which could reflect constipation. Vascular/Lymphatic: In veins aorto bi-iliac atherosclerotic disease. No aneurysm. Mesenteric vessels patent proximally. No pathologically enlarged intra-abdominopelvic lymph nodes. Reproductive: Uterus and ovaries within normal limits. Other: No other significant free intraperitoneal air. No free fluid. Diastasis of the rectus abdominis musculature with additional fat containing paraumbilical hernia noted. No  associated inflammation. Postoperative stranding noted within the subcutaneous fat of the left ventral abdomen as well. Musculoskeletal: No acute osseous abnormality. No discrete lytic or blastic osseous lesions. Chronic compression deformity involving the T12 vertebral body, stable. IM fixation rod partially visualize within the left femur. IMPRESSION: 1. Increased inflammatory fat stranding with a few small locules of extraluminal free air within the right lower quadrant adjacent to the surgical anastomosis, concerning for possible anastomotic leak. 3.3 x 2.9 x 4.2 cm collection within subcutaneous fat of the overlying right ventral abdominal wall, worsened from previous. 2. No evidence for bowel obstruction status post partial colectomy and recent colostomy takedown. 3. Moderate retained stool within the residual colon, suggesting constipation. 4. Unchanged bilateral adrenal masses. 5. Advanced aorto bi-iliac atherosclerotic disease. Critical Value/emergent results were called by telephone at the time of interpretation on 09/01/2018 at 1:38 pm to Dr. Carrie Mew , who verbally acknowledged these results. Electronically Signed   By: Jeannine Boga M.D.   On: 09/01/2018 13:41   [place] Image Guided Drainage By Percutaneous Catheter  Result Date: 09/02/2018 INDICATION: 72 year old with a superficial abscess along the right lower abdominal wall at previous ostomy takedown site. Patient presents for ultrasound-guided aspiration and drainage. EXAM:  ULTRASOUND-GUIDED DRAIN PLACEMENT WITHIN A SUBCUTANEOUS ABDOMINAL WALL ABSCESS MEDICATIONS: None ANESTHESIA/SEDATION: None COMPLICATIONS: None immediate. PROCEDURE: Informed written consent was obtained from the patient after a thorough discussion of the procedural risks, benefits and alternatives. All questions were addressed. A timeout was performed prior to the initiation of the procedure. Superficial fluid collection in the right lower abdomen was identified  with ultrasound. The overlying skin was prepped with chlorhexidine and sterile field was created. Skin was anesthetized with 1% lidocaine. Yueh catheter was directed into the fluid collection with ultrasound guidance and purulent looking fluid was aspirated. A stiff Amplatz wire was advanced through the catheter and the tract was dilated to accommodate a 10.2 [place] multipurpose drain. The fluid collection was decompressed with the drain. Drain was sutured to skin and attached to a suction bulb. Dressing was placed over the tube site. FINDINGS: Irregular subcutaneous fluid collection in the right lower abdomen. 20 mL of gray colored purulent fluid was removed. The fluid collection was decompressed following drain placement. IMPRESSION: Successful placement of a drainage catheter within the subcutaneous abscess in the right lower abdomen with ultrasound guidance. Fluid was sent for culture. Electronically Signed   By: Markus Daft M.D.   On: 09/02/2018 11:44     CBC Recent Labs  Lab 09/01/18 1003 09/02/18 0440 09/03/18 0632  WBC 18.7* 17.9* 8.9  HGB 12.8 11.3* 10.7*  HCT 39.6 36.4 34.2*  PLT 502* 353 352  MCV 90.4 93.3 92.2  MCH 29.2 29.0 28.8  MCHC 32.3 31.0 31.3  RDW 15.5 15.7* 15.2    Chemistries  Recent Labs  Lab 09/01/18 1003 09/02/18 0440 09/03/18 0632  NA 134* 138 141  K 4.0 3.6 3.5  CL 99 107 105  CO2 27 26 28   GLUCOSE 106* 113* 99  BUN 13 8 8   CREATININE 0.65 0.55 0.65  CALCIUM 8.6* 8.0* 8.0*  AST 13*  --   --   ALT 9  --   --   ALKPHOS 99  --   --   BILITOT 0.4  --   --    ------------------------------------------------------------------------------------------------------------------ estimated creatinine clearance is 63.6 mL/min (by C-G formula based on SCr of 0.65 mg/dL). ------------------------------------------------------------------------------------------------------------------ No results for input(s): HGBA1C in the last 72  hours. ------------------------------------------------------------------------------------------------------------------ No results for input(s): CHOL, HDL, LDLCALC, TRIG, CHOLHDL, LDLDIRECT in the last 72 hours. ------------------------------------------------------------------------------------------------------------------ No results for input(s): TSH, T4TOTAL, T3FREE, THYROIDAB in the last 72 hours.  Invalid input(s): FREET3 ------------------------------------------------------------------------------------------------------------------ No results for input(s): VITAMINB12, FOLATE, FERRITIN, TIBC, IRON, RETICCTPCT in the last 72 hours.  Coagulation profile No results for input(s): INR, PROTIME in the last 168 hours.  No results for input(s): DDIMER in the last 72 hours.  Cardiac Enzymes Recent Labs  Lab 09/01/18 1003  TROPONINI <0.03   ------------------------------------------------------------------------------------------------------------------ Invalid input(s): POCBNP    Assessment & Plan   IMPRESSION AND PLAN: Patient is a 72 year old being admitted by surgery for abdominal pain and intra-abdominal abscess  1.  COPD currently stable we will continue her inhalers continue nebulizers as needed  2.  GERD continue change to oral PPI as before  3.  Seasonal allergies resume Singulair   4.  Hyperlipidemia resume Pravachol   5.  Recent history of C. difficile colitis diarrhea is resolved o continue oral Vancomycin change to oral antibiotics  6.  Miscellaneous recommend DVT prophylaxis with either heparin or Lovenox      Code Status Orders  (From admission, onward)         Start  Ordered   09/01/18 2031  Full code  Continuous     09/01/18 2030        Code Status History    Date Active Date Inactive Code Status Order ID Comments User Context   07/31/2018 0444 08/01/2018 1903 Full Code [id number]  Harrie Foreman, MD Inpatient   06/25/2018 2009  07/13/2018 1848 Full Code [id number]  Jules Husbands, MD Inpatient   12/01/2017 1437 12/05/2017 1955 Full Code [id number]  Nestor Lewandowsky, MD Inpatient   04/15/2017 0317 04/19/2017 1822 Full Code [id number]  Harrie Foreman, MD Inpatient                Lab Results  Component Value Date   PLT 352 09/03/2018     Time Spent in minutes  70min Greater than 50% of time spent in care coordination and counseling patient regarding the condition and plan of care.   Dustin Flock M.D on 09/04/2018 at 11:47 AM  Between 7am to 6pm - Pager - [phone]  After 6pm go to [email or web address] - Proofreader  [employer]   Office  [phone]

## 2018-09-04 NOTE — Progress Notes (Signed)
[place] INFECTIOUS DISEASE PROGRESS NOTE Date of Admission:  09/01/2018     ID: [name] is a 72 y.o. female with abd abscess Active Problems:   Postprocedural intraabdominal abscess   Improving. Can dc on oral cipro and flagyl and oral vanco.  Would continue the Cipro and flagyl for 2 weeks min - can extend if drain remains in place at that time. Cont oral vanco for 10 days after finishing the cipro and flagyl.

## 2018-09-06 LAB — CULTURE, BLOOD (ROUTINE X 2)
Culture: NO GROWTH
Culture: NO GROWTH
Special Requests: ADEQUATE
Special Requests: ADEQUATE

## 2018-09-07 LAB — AEROBIC/ANAEROBIC CULTURE W GRAM STAIN (SURGICAL/DEEP WOUND)

## 2018-09-08 ENCOUNTER — Telehealth: Payer: Self-pay | Admitting: Surgery

## 2018-09-08 NOTE — Telephone Encounter (Signed)
Patient is calling with concerns with her bowel movements. She is unsure if she should be concerned. Patient states that she notices a lot of foam in the toilet after her BM's. The color of the stole is brown, no mucus, no blood. No constipation or hard stool. This started yesterday-09/07/18. She says this does not happen with each BM's-only twice since yesterday. Patient is having 3-5 bowel movements a day. Stomach does ache when having a BM-not severe pain. No vomiting or fever, however some nausea.   Patient is on antibiotic for C-Diff (vancomycin, flagyl, Cipro). Patient was also prescribed Augmentin, however was told by Edison Simon, PA not to get that antibiotic.  Phone number-[phone].

## 2018-09-08 NOTE — Telephone Encounter (Signed)
Patient stated  she had only seen the foam twice.  We discussed the c-diff course of antibiotics and she was instructed to call pharmacy and pick up the remaining days. She has an appointment on 09/14/18 with Dr.Pabon.

## 2018-09-09 ENCOUNTER — Encounter: Payer: Self-pay | Admitting: Physician Assistant

## 2018-09-09 ENCOUNTER — Ambulatory Visit
Admission: RE | Admit: 2018-09-09 | Discharge: 2018-09-09 | Disposition: A | Discharge status: Home / Self Care | Payer: Medicare HMO | Source: Ambulatory Visit | Attending: Physician Assistant | Admitting: Physician Assistant

## 2018-09-09 ENCOUNTER — Ambulatory Visit (INDEPENDENT_AMBULATORY_CARE_PROVIDER_SITE_OTHER): Payer: Medicare HMO | Admitting: Physician Assistant

## 2018-09-09 ENCOUNTER — Telehealth: Payer: Self-pay

## 2018-09-09 VITALS — BP 138/87 | HR 101 | Temp 98.6°F | Resp 16 | Wt 157.0 lb

## 2018-09-09 DIAGNOSIS — I808 Phlebitis and thrombophlebitis of other sites: Secondary | ICD-10-CM | POA: Diagnosis not present

## 2018-09-09 DIAGNOSIS — I8290 Acute embolism and thrombosis of unspecified vein: Secondary | ICD-10-CM

## 2018-09-09 DIAGNOSIS — M79601 Pain in right arm: Secondary | ICD-10-CM | POA: Insufficient documentation

## 2018-09-09 DIAGNOSIS — Z86718 Personal history of other venous thrombosis and embolism: Secondary | ICD-10-CM | POA: Diagnosis not present

## 2018-09-09 DIAGNOSIS — I82611 Acute embolism and thrombosis of superficial veins of right upper extremity: Secondary | ICD-10-CM | POA: Diagnosis not present

## 2018-09-09 NOTE — Telephone Encounter (Signed)
Patient called stating that for the past 2 days, one of the veins in her right arm has felt hard. She states it hurts to lay on that arm. She also complains of some tingling in her fingers that comes and goes. Patient denies any recent trauma, skin discoloration, swelling, chest pain or shortness of breath. Appointment was scheduled for today at 4pm.

## 2018-09-09 NOTE — Telephone Encounter (Signed)
-----   Message from Mar Daring, [place] sent at 09/09/2018  6:29 PM EST ----- There is a superficial blood clot at that mid forearm. This is good that it is not in the deep veins. This is treated with ibuprofen and tylenol for inflammation and pain. Also applying warm compress (heating pad) to the arm will help the body hopefully reabsorb this. Please call the office if symptoms worsen, fail to resolve, swelling occurs or if you develop redness or warmth to the arm. Please call if you have any questions.

## 2018-09-09 NOTE — Progress Notes (Signed)
Patient: Kimberly [name] Female    DOB: 1945-10-28   72 y.o.   MRN: [id number] Visit Date: 09/09/2018  Today's Provider: Mar Daring, PA-C   No chief complaint on file.  Subjective:    HPI Patient here today c/o pain on right forearm x's 2 days. Patient denies any swelling, or redness. Patient was in the hospital last week with IV in the right arm. Does have personal history of cancer and blood clot in the peroneal vein.     Allergies  Allergen Reactions  . Betadine [Povidone Iodine] Other (See Comments)    Topical iodine she states "if you put it in an open sore it will cause a eating ulcer."  . Other Nausea And Vomiting    Antihystamines  . Sinus Formula [Cholestatin] Nausea Only     Current Outpatient Medications:  .  amoxicillin-clavulanate (AUGMENTIN) 875-125 MG tablet, Take 1 tablet by mouth 2 (two) times daily for 10 days., Disp: 20 tablet, Rfl: 0 .  budesonide-formoterol (SYMBICORT) 80-4.5 MCG/ACT inhaler, Inhale 2 puffs into the lungs 2 (two) times daily., Disp: 3 Inhaler, Rfl: 3 .  cholecalciferol (VITAMIN D) 1000 units tablet, Take 1,000 Units by mouth daily., Disp: , Rfl:  .  ciprofloxacin (CIPRO) 500 MG tablet, Take 1 tablet (500 mg total) by mouth 2 (two) times daily for 10 days., Disp: 20 tablet, Rfl: 0 .  feeding supplement (BOOST / RESOURCE BREEZE) LIQD, Take 1 Container by mouth 3 (three) times daily between meals., Disp: 30 Container, Rfl: 0 .  gabapentin (NEURONTIN) 400 MG capsule, TAKE 1 CAPSULE TWICE DAILY (Patient taking differently: Take 400 mg by mouth 2 (two) times daily. ), Disp: 180 capsule, Rfl: 4 .  HYDROcodone-acetaminophen (NORCO) 7.5-325 MG tablet, Take 1-2 tablets by mouth every 6 (six) hours as needed for moderate pain., Disp: 120 tablet, Rfl: 0 .  ibuprofen (ADVIL,MOTRIN) 200 MG tablet, Take 400 mg by mouth daily as needed for headache or moderate pain., Disp: , Rfl:  .  meloxicam (MOBIC) 15 MG tablet, TAKE 1 TABLET EVERY DAY AS  NEEDED, Disp: 90 tablet, Rfl: 3 .  metroNIDAZOLE (FLAGYL) 500 MG tablet, Take 1 tablet (500 mg total) by mouth 3 (three) times daily for 10 days., Disp: 30 tablet, Rfl: 0 .  montelukast (SINGULAIR) 10 MG tablet, Take 1 tablet (10 mg total) by mouth daily., Disp: 90 tablet, Rfl: 1 .  omeprazole (PRILOSEC) 20 MG capsule, TAKE 1 CAPSULE EVERY DAY (Patient taking differently: Take 20 mg by mouth daily. ), Disp: 90 capsule, Rfl: 4 .  ondansetron (ZOFRAN) 4 MG tablet, Take 0.5-1 tablets (2-4 mg total) by mouth every 8 (eight) hours as needed for nausea or vomiting., Disp: 20 tablet, Rfl: 0 .  pravastatin (PRAVACHOL) 40 MG tablet, TAKE 1 TABLET EVERY DAY (Patient taking differently: Take 40 mg by mouth daily. ), Disp: 90 tablet, Rfl: 3 .  Probiotic Product (PROBIOTIC DAILY PO), Take 1 capsule by mouth daily. , Disp: , Rfl:  .  pyridOXINE (VITAMIN B-6) 50 MG tablet, Take 1 tablet (50 mg total) by mouth daily., Disp: 30 tablet, Rfl: 3 .  Triamcinolone Acetonide (NASACORT ALLERGY 24HR NA), Place 2 sprays into the nose daily. , Disp: , Rfl:  .  vancomycin (VANCOCIN) 125 MG capsule, Take 1 capsule (125 mg total) by mouth 4 (four) times daily., Disp: 56 capsule, Rfl: 1 .  vitamin B-12 (CYANOCOBALAMIN) 1000 MCG tablet, Take 1,000 mcg by mouth daily., Disp: , Rfl:  No current facility-administered medications for this visit.   Facility-Administered Medications Ordered in Other Visits:  .  0.9 %  sodium chloride infusion, , Intravenous, Continuous, Earlie Server, MD, Stopped at 07/22/17 1150 .  heparin lock flush 100 unit/mL, 500 Units, Intravenous, Once, Earlie Server, MD .  heparin lock flush 100 unit/mL, 500 Units, Intravenous, Once, Earlie Server, MD .  heparin lock flush 100 unit/mL, 500 Units, Intravenous, Once, Earlie Server, MD .  sodium chloride flush (NS) 0.9 % injection 10 mL, 10 mL, Intracatheter, PRN, Earlie Server, MD .  sodium chloride flush (NS) 0.9 % injection 10 mL, 10 mL, Intravenous, PRN, Earlie Server, MD, 10 mL at  05/29/17 1347  Review of Systems  Constitutional: Negative.   Respiratory: Negative.   Cardiovascular: Negative.   Musculoskeletal:       Right arm pain  Neurological: Negative.     Social History   Tobacco Use  . Smoking status: Former Smoker    Packs/day: 0.25    Years: 55.00    Pack years: 13.75    Types: Cigarettes    Last attempt to quit: 05/21/2017    Years since quitting: 1.3  . Smokeless tobacco: Never Used  . Tobacco comment: started age 86 1/2 to 1 ppd  Substance Use Topics  . Alcohol use: Yes    Alcohol/week: 0.0 standard drinks    Comment: occasionally drinks beer   Objective:   BP 138/87 (BP Location: Left Arm, Patient Position: Sitting, Cuff Size: Normal)   Pulse (!) 101   Temp 98.6 F (37 C) (Oral)   Resp 16   Wt 157 lb (71.2 kg)   BMI 26.13 kg/m  Vitals:   09/09/18 1558  BP: 138/87  Pulse: (!) 101  Resp: 16  Temp: 98.6 F (37 C)  TempSrc: Oral  Weight: 157 lb (71.2 kg)     Physical Exam  Constitutional: She appears well-developed and well-nourished.  HENT:  Head: Normocephalic and atraumatic.  Eyes: EOM are normal.  Neck: Normal range of motion. Neck supple.  Cardiovascular: Intact distal pulses.  Pulmonary/Chest: Effort normal. No respiratory distress.  Musculoskeletal:       Arms: Psychiatric: She has a normal mood and affect. Her behavior is normal. Judgment and thought content normal.  Vitals reviewed.   CLINICAL DATA: Right upper extremity pain for the past day. History of lower extremity DVT. History central line placement. Evaluate for DVT.  EXAM: RIGHT UPPER EXTREMITY VENOUS DOPPLER ULTRASOUND  TECHNIQUE: Gray-scale sonography with graded compression, as well as color Doppler and duplex ultrasound were performed to evaluate the upper extremity deep venous system from the level of the subclavian vein and including the jugular, axillary, basilic, radial, ulnar and upper cephalic vein. Spectral Doppler was utilized to  evaluate flow at rest and with distal augmentation maneuvers.  COMPARISON: None.  FINDINGS: Contralateral Subclavian Vein: Respiratory phasicity is normal and symmetric with the symptomatic side. No evidence of thrombus. Normal compressibility.  Internal Jugular Vein: No evidence of thrombus. Normal compressibility, respiratory phasicity and response to augmentation.  Subclavian Vein: No evidence of thrombus. Normal compressibility, respiratory phasicity and response to augmentation.  Axillary Vein: No evidence of thrombus. Normal compressibility, respiratory phasicity and response to augmentation.  Cephalic Vein: No evidence of thrombus. Normal compressibility, respiratory phasicity and response to augmentation.  Basilic Vein: No evidence of thrombus. Normal compressibility, respiratory phasicity and response to augmentation.  Brachial Veins: There is hypoechoic occlusive thrombus within the distal aspect of the basilic vein at the  level of the mid forearm (image 22, 26 and 27). The basilic vein appears patent both peripherally and centrally to this short-segment occlusive SVT.  Radial Veins: No evidence of thrombus. Normal compressibility, respiratory phasicity and response to augmentation.  Ulnar Veins: No evidence of thrombus. Normal compressibility, respiratory phasicity and response to augmentation.  Venous Reflux: None visualized.  Other Findings: None visualized.  IMPRESSION: 1. No evidence of DVT within the right upper extremity. 2. Examination is positive for occlusive short-segment superficial thrombophlebitis involving the basilic vein at the level the mid forearm. The basilic vein appears patent both peripherally and centrally to this short-segment occlusive SVT.   Electronically Signed By: Sandi Mariscal M.D. On: 09/09/2018 18:09    Assessment & Plan:     1. Superficial thrombophlebitis of right upper extremity Discussed conservative therapy  with warm compress and tylenol or IBU prn for pain. Call if worsening.   2. Pain in right arm Will order [place] to r/o DVT due to patient's personal history of clotting and cancer, plus recent hospitalization as well.   US revealed SFTP only.  - US Venous Img Upper Uni Right; Future  3. History of blood clots See above medical treatment plan. - US Venous Img Upper Uni Right; Future       Mar Daring, PA-C  [facility]

## 2018-09-09 NOTE — Telephone Encounter (Signed)
lmtcb

## 2018-09-10 ENCOUNTER — Telehealth: Payer: Self-pay | Admitting: Family Medicine

## 2018-09-10 NOTE — Telephone Encounter (Signed)
Spoke to pt and gave her imaging results and instructions for relief.  Advised to call us back if any problems start.  dbs

## 2018-09-10 NOTE — Telephone Encounter (Signed)
Pt calling for imaging results.  Please call pt back to discuss.  Thanks, [employer]

## 2018-09-12 DIAGNOSIS — S72046A Nondisplaced fracture of base of neck of unspecified femur, initial encounter for closed fracture: Secondary | ICD-10-CM | POA: Diagnosis not present

## 2018-09-12 DIAGNOSIS — J441 Chronic obstructive pulmonary disease with (acute) exacerbation: Secondary | ICD-10-CM | POA: Diagnosis not present

## 2018-09-12 DIAGNOSIS — R0602 Shortness of breath: Secondary | ICD-10-CM | POA: Diagnosis not present

## 2018-09-14 ENCOUNTER — Other Ambulatory Visit: Payer: Self-pay

## 2018-09-14 ENCOUNTER — Encounter: Payer: Self-pay | Admitting: *Deleted

## 2018-09-14 ENCOUNTER — Ambulatory Visit (INDEPENDENT_AMBULATORY_CARE_PROVIDER_SITE_OTHER): Payer: Medicare HMO | Admitting: Surgery

## 2018-09-14 ENCOUNTER — Ambulatory Visit
Admission: RE | Admit: 2018-09-14 | Discharge: 2018-09-14 | Disposition: A | Discharge status: Home / Self Care | Payer: Medicare HMO | Source: Ambulatory Visit | Attending: Surgery | Admitting: Surgery

## 2018-09-14 ENCOUNTER — Telehealth: Payer: Self-pay

## 2018-09-14 ENCOUNTER — Encounter: Payer: Self-pay | Admitting: Surgery

## 2018-09-14 VITALS — BP 170/109 | HR 94 | Temp 97.9°F | Resp 16 | Ht 65.0 in | Wt 159.0 lb

## 2018-09-14 DIAGNOSIS — K5730 Diverticulosis of large intestine without perforation or abscess without bleeding: Secondary | ICD-10-CM | POA: Diagnosis not present

## 2018-09-14 DIAGNOSIS — L02211 Cutaneous abscess of abdominal wall: Secondary | ICD-10-CM | POA: Insufficient documentation

## 2018-09-14 MED ORDER — IOPAMIDOL (ISOVUE-300) INJECTION 61%
100.0000 mL | Freq: Once | INTRAVENOUS | Status: AC | PRN
  Administered 2018-09-14: 100 mL via INTRAVENOUS

## 2018-09-14 NOTE — Telephone Encounter (Signed)
Received call regarding CT scan results from Traci at [facility].    Dr.Pabon notified and per Dr.Pabon patient it to keep her appointment this Wednesday 09/16/18 for drain removal.   Message left on patient's VM and to call back if she has questions.

## 2018-09-14 NOTE — Patient Instructions (Signed)
Patient to have a ct scan tomorrow and return on Wednesday.

## 2018-09-14 NOTE — Progress Notes (Signed)
Patient has been scheduled for a CT abdomen/pelvis with contrast at [facility] for today at 2 pm. The patient was instructed to go to the imaging center as soon as she left our office to begin prep.  Patient is aware to not have anything else to eat or drink prior to CT scan unless instructed to do so for prep purposes. Patient verbalizes understanding.  The patient will follow up in the office on 09-16-18 with Dr. Dahlia Byes to discuss the results.

## 2018-09-14 NOTE — Progress Notes (Signed)
Outpatient Surgical Follow Up  09/14/2018  Kimberly [name] is an 72 y.o. female.   Chief Complaint  Patient presents with  . Follow-up    HPI: His colostomy takedown developed an abdominal wall abscess as to where the absorbable mesh was inserted.  Clinically there is no evidence of anastomotic leak.  She is taking p.o.  Having bowel movements no fevers no chills minimal abdominal discomfort. Had about 5 to 10 cc of fluid from the drain.  Past Medical History:  Diagnosis Date  . Breast cancer, left (HCC)    Lumpectomy and rad tx's.  . Chronic back pain   . Colon cancer ([place])   . COPD (chronic obstructive pulmonary disease) ([place])   . Degenerative disc disease, lumbar 04/2013  . Dyspnea   . Family history of adverse reaction to anesthesia    son arrested after anesthesia  . GERD (gastroesophageal reflux disease)   . Hyperlipidemia   . Iron deficiency anemia 05/27/2017  . Neuropathy   . Osteoarthritis    of knee  . Osteoporosis   . Small bowel obstruction ([place]) 04/16/2017  . Tobacco abuse   . Vitamin D deficiency     Past Surgical History:  Procedure Laterality Date  . APPENDECTOMY  1965  . BREAST EXCISIONAL BIOPSY Left 08/01/2003   lumpectomy rad 11/04-2/28/2005  . BREAST SURGERY    . CESAREAN SECTION     x3  . COLON SURGERY    . COLONOSCOPY W/ POLYPECTOMY    . COLONOSCOPY WITH PROPOFOL N/A 04/09/2018   Procedure: COLONOSCOPY WITH PROPOFOL;  Surgeon: Virgel Manifold, MD;  Location: [facility];  Service: Gastroenterology;  Laterality: N/A;  . COLOSTOMY TAKEDOWN N/A 06/25/2018   Procedure: COLOSTOMY TAKEDOWN;  Surgeon: Jules Husbands, MD;  Location: [facility] OR[facility];  Service: General;  Laterality: N/A;  . ESOPHAGOGASTRODUODENOSCOPY (EGD) WITH PROPOFOL N/A 04/04/2017   Procedure: ESOPHAGOGASTRODUODENOSCOPY (EGD) WITH PROPOFOL;  Surgeon: [name], MD;  Location: [facility];  Service: Endoscopy;  Laterality: N/A;  . FRACTURE SURGERY    . HIP FRACTURE SURGERY Left  01/25/2012  . JOINT REPLACEMENT    . LAPAROTOMY N/A 04/15/2017   Procedure: EXPLORATORY LAPAROTOMY;  Surgeon: Clayburn Pert, MD;  Location: [facility];  Service: General;  Laterality: N/A;  . NECK SURGERY  12/2011  . PARASTOMAL HERNIA REPAIR N/A 12/03/2017   Procedure: HERNIA REPAIR PARASTOMAL;  Surgeon: Jules Husbands, MD;  Location: [facility];  Service: General;  Laterality: N/A;  . PARASTOMAL HERNIA REPAIR N/A 06/25/2018   Procedure: HERNIA REPAIR PARASTOMAL;  Surgeon: Jules Husbands, MD;  Location: [facility];  Service: General;  Laterality: N/A;  . PORTACATH PLACEMENT Right 05/21/2017   Procedure: INSERTION PORT-A-CATH;  Surgeon: Clayburn Pert, MD;  Location: [facility];  Service: General;  Laterality: Right;  . WRIST FRACTURE SURGERY      Family History  Problem Relation Age of Onset  . Diabetes Mother        type 2  . Coronary artery disease Mother   . Deep vein thrombosis Mother   . Colon cancer Father   . Asthma Brother   . Diabetes Brother        type 2    Social History:  reports that she quit smoking about 15 months ago. Her smoking use included cigarettes. She has a 13.75 pack-year smoking history. She has never used smokeless tobacco. She reports current alcohol use. She reports that she does not use drugs.  Allergies:  Allergies  Allergen Reactions  .  Betadine [Povidone Iodine] Other (See Comments)    Topical iodine she states "if you put it in an open sore it will cause a eating ulcer."  . Other Nausea And Vomiting    Antihystamines  . Sinus Formula [Cholestatin] Nausea Only    Medications reviewed.    ROS Full ROS performed and is otherwise negative other than what is stated in HPI   BP (!) 170/109   Pulse 94   Temp 97.9 F (36.6 C) (Skin)   Resp 16   Ht 5\' 5"  (1.651 m)   Wt 159 lb (72.1 kg)   SpO2 98%   BMI 26.46 kg/m   Physical Exam  NAD Abd: soft, nt. Drain in place w cloudy fluid. No peritonitis.bulb replaced.   Assessment/Plan:  1.  Abdominal wall abscess Obtain a CT scan of the abdomen and pelvis prior to removing the drain.  Currently she is doing well without evidence of any major complications.  She is not septic and no clear evidence of anastomotic leak.  I will see her back in 2 days hopefully we will remove that drain at that time  [name] Hamman, MD [place] [facility]

## 2018-09-16 ENCOUNTER — Ambulatory Visit (INDEPENDENT_AMBULATORY_CARE_PROVIDER_SITE_OTHER): Payer: Medicare HMO | Admitting: Surgery

## 2018-09-16 ENCOUNTER — Encounter: Payer: Self-pay | Admitting: Surgery

## 2018-09-16 ENCOUNTER — Other Ambulatory Visit: Payer: Self-pay

## 2018-09-16 VITALS — BP 158/81 | HR 98 | Temp 97.5°F | Resp 18 | Ht 65.0 in | Wt 157.0 lb

## 2018-09-16 DIAGNOSIS — Z09 Encounter for follow-up examination after completed treatment for conditions other than malignant neoplasm: Secondary | ICD-10-CM

## 2018-09-16 NOTE — Progress Notes (Signed)
S/p colostomy takedown and repair of parastomal hernia. Doing better.  Minimal drain output. Scan personally reviewed with complete resolution of abscess.  PE NAD Abd: soft, nt, drain removed. No infection  A/p Doing well Resolving abd wall abscess RTC 1 month

## 2018-09-16 NOTE — Patient Instructions (Signed)
Please see your follow up appointment listed above.

## 2018-09-18 ENCOUNTER — Inpatient Hospital Stay: Payer: Medicare HMO | Attending: Oncology | Admitting: Oncology

## 2018-09-18 ENCOUNTER — Other Ambulatory Visit: Payer: Self-pay

## 2018-09-18 ENCOUNTER — Encounter: Payer: Self-pay | Admitting: Oncology

## 2018-09-18 VITALS — BP 138/84 | HR 99 | Temp 98.0°F | Resp 18 | Wt 158.0 lb

## 2018-09-18 DIAGNOSIS — Z933 Colostomy status: Secondary | ICD-10-CM | POA: Insufficient documentation

## 2018-09-18 DIAGNOSIS — Z79899 Other long term (current) drug therapy: Secondary | ICD-10-CM | POA: Diagnosis not present

## 2018-09-18 DIAGNOSIS — Z9221 Personal history of antineoplastic chemotherapy: Secondary | ICD-10-CM | POA: Diagnosis not present

## 2018-09-18 DIAGNOSIS — G629 Polyneuropathy, unspecified: Secondary | ICD-10-CM | POA: Diagnosis not present

## 2018-09-18 DIAGNOSIS — D72821 Monocytosis (symptomatic): Secondary | ICD-10-CM | POA: Diagnosis not present

## 2018-09-18 DIAGNOSIS — Z923 Personal history of irradiation: Secondary | ICD-10-CM | POA: Diagnosis not present

## 2018-09-18 DIAGNOSIS — A09 Infectious gastroenteritis and colitis, unspecified: Secondary | ICD-10-CM

## 2018-09-18 DIAGNOSIS — C184 Malignant neoplasm of transverse colon: Secondary | ICD-10-CM

## 2018-09-18 DIAGNOSIS — Z87891 Personal history of nicotine dependence: Secondary | ICD-10-CM | POA: Diagnosis not present

## 2018-09-18 NOTE — Progress Notes (Signed)
Patient here for follow up. She is currently on antibiotic for surgical incision infection.

## 2018-09-19 NOTE — Progress Notes (Signed)
[facility] Follow up  Visit:  Patient Care Team: Birdie Sons, MD as PCP - General (Family Medicine) Earlie Server, MD as Consulting Physician (Oncology) Noreene Filbert, MD as Referring Physician (Radiation Oncology) Jules Husbands, MD as Consulting Physician (General Surgery)  PURPOSE OF VISIT: Adjuvant chemotherapy for colon cancer  HISTORY OF PRESENTING ILLNESS: Kimberly [name] 72 y.o. female with PMH listed as below presents for follow up for the management of Stage IIIB Colon Cancer Patient reports a remote history of breast cancer s/p lumpectomy and radiation treatments. Patient had been having abdominal pain, weight loss and bloating.  CT scan showed partial obstruction at the level of transverse colon and ileocolic mesenteric adenopathy. She was prepared for a colonoscopy on 04/15/2017. She started to have worsened abdominal pain, nausea vomiting, unable to maintain oral intake and was sent to ED. She was found to have bowel obstruction and underwent  exploratory laparotomy with transverse colectomy, with end colostomy and mucous fistula formation for obstructing colon lesion. Differential diagnosis prior to surgery was metastatic breast cancer in colon vs colon cancer. The patient did have a colonoscopy 2 years ago without the mass being present at that time but did have 2 small polyps in the transverse colon that were removed.  04/15/2017 Patient underwent transverse colon resection.  Pathology showed T4aN1 moderate differentiated adenocarcinoma, negative margin, 3/16 lymph nodes involved with cancer. Low probability of MSI-H.   # Genetic test INVITAE came back negative. No pathogenetic sequence variants or deletions/duplications identified. Results was scanned to [employer] (a copy of the report was provided to patient and her daughter)  # # baseline CEA is 2.1 # colostomy reversal on 06/25/2018, subsequently developed C. difficile colitis x 2.    TREATMENT: adjuvant  FOLFOX, Oxaliplatin was discontinued after 3 cycles due to worsening of pre-existing neuropathy. Finished another 9 cycles of 5-FU.  S/p adjuvant RT.   INTERVAL HISTORY Patient presents for follow-up of colon cancer. During the interval, she had recurrent c Diff colitis. Also has developed abdominal wall abscess on 09/01/2018 . S/p percutaneous drain placement .treatment with antibiotics with Augmentin 10 days course.  CT abdomen/pelvis was done which showed resolution of abscess, no intraabdominal abscess.   Also developed superficial thrombophlebitis of right upper extremity, [place] negative for DVT.  Reports that she still has intermittent diarrhea. She is still in the course of oral vancomycin.  Denies any abdominal pain, nausea, vomiting.    Review of Systems  Constitutional: Negative for appetite change, chills, fatigue, fever and unexpected weight change.  HENT:   Negative for hearing loss, lump/mass, nosebleeds and voice change.   Eyes: Negative for eye problems.  Respiratory: Negative for chest tightness, cough and shortness of breath.   Cardiovascular: Negative for chest pain and leg swelling.  Gastrointestinal: Positive for diarrhea. Negative for abdominal distention, abdominal pain, blood in stool, constipation and nausea.  Endocrine: Negative for hot flashes.  Genitourinary: Negative for difficulty urinating, dysuria and frequency.   Musculoskeletal: Negative for arthralgias, gait problem and myalgias.  Skin: Negative for itching, rash and wound.  Neurological: Negative for dizziness, extremity weakness, gait problem and headaches.       Chronic numbness and tingling of fingertips and toes.  Hematological: Negative for adenopathy. Does not bruise/bleed easily.  Psychiatric/Behavioral: Negative for confusion, decreased concentration and depression. The patient is not nervous/anxious.       MEDICAL HISTORY: Past Medical History:  Diagnosis Date  . Breast cancer, left ([place])  Lumpectomy and rad tx's.  . Chronic back pain   . Colon cancer ([place])   . COPD (chronic obstructive pulmonary disease) ([place])   . Degenerative disc disease, lumbar 04/2013  . Dyspnea   . Family history of adverse reaction to anesthesia    son arrested after anesthesia  . GERD (gastroesophageal reflux disease)   . Hyperlipidemia   . Iron deficiency anemia 05/27/2017  . Neuropathy   . Osteoarthritis    of knee  . Osteoporosis   . Small bowel obstruction ([place]) 04/16/2017  . Tobacco abuse   . Vitamin D deficiency     SURGICAL HISTORY: Past Surgical History:  Procedure Laterality Date  . APPENDECTOMY  1965  . BREAST EXCISIONAL BIOPSY Left 08/01/2003   lumpectomy rad 11/04-2/28/2005  . BREAST SURGERY    . CESAREAN SECTION     x3  . COLON SURGERY    . COLONOSCOPY W/ POLYPECTOMY    . COLONOSCOPY WITH PROPOFOL N/A 04/09/2018   Procedure: COLONOSCOPY WITH PROPOFOL;  Surgeon: Virgel Manifold, MD;  Location: [facility];  Service: Gastroenterology;  Laterality: N/A;  . COLOSTOMY TAKEDOWN N/A 06/25/2018   Procedure: COLOSTOMY TAKEDOWN;  Surgeon: Jules Husbands, MD;  Location: [facility];  Service: General;  Laterality: N/A;  . ESOPHAGOGASTRODUODENOSCOPY (EGD) WITH PROPOFOL N/A 04/04/2017   Procedure: ESOPHAGOGASTRODUODENOSCOPY (EGD) WITH PROPOFOL;  Surgeon: [name], MD;  Location: [facility];  Service: Endoscopy;  Laterality: N/A;  . FRACTURE SURGERY    . HIP FRACTURE SURGERY Left 01/25/2012  . JOINT REPLACEMENT    . LAPAROTOMY N/A 04/15/2017   Procedure: EXPLORATORY LAPAROTOMY;  Surgeon: Clayburn Pert, MD;  Location: [facility];  Service: General;  Laterality: N/A;  . NECK SURGERY  12/2011  . PARASTOMAL HERNIA REPAIR N/A 12/03/2017   Procedure: HERNIA REPAIR PARASTOMAL;  Surgeon: Jules Husbands, MD;  Location: [facility];  Service: General;  Laterality: N/A;  . PARASTOMAL HERNIA REPAIR N/A 06/25/2018   Procedure: HERNIA REPAIR PARASTOMAL;  Surgeon: Jules Husbands, MD;   Location: [facility];  Service: General;  Laterality: N/A;  . PORTACATH PLACEMENT Right 05/21/2017   Procedure: INSERTION PORT-A-CATH;  Surgeon: Clayburn Pert, MD;  Location: [facility];  Service: General;  Laterality: Right;  . WRIST FRACTURE SURGERY      SOCIAL HISTORY: Social History   Socioeconomic History  . Marital status: Divorced    Spouse name: Not on file  . Number of children: 3  . Years of education: Not on file  . Highest education level: 7th grade  Occupational History  . Occupation: retired  Scientific laboratory technician  . Financial resource strain: Not hard at all  . Food insecurity:    Worry: Never true    Inability: Never true  . Transportation needs:    Medical: No    Non-medical: No  Tobacco Use  . Smoking status: Former Smoker    Packs/day: 0.25    Years: 55.00    Pack years: 13.75    Types: Cigarettes    Last attempt to quit: 05/21/2017    Years since quitting: 1.3  . Smokeless tobacco: Never Used  . Tobacco comment: started age 89 1/2 to 1 ppd  Substance and Sexual Activity  . Alcohol use: Yes    Alcohol/week: 0.0 standard drinks    Comment: occasionally drinks beer  . Drug use: No  . Sexual activity: Never  Lifestyle  . Physical activity:    Days per week: Not on file    Minutes per session:  Not on file  . Stress: Not at all  Relationships  . Social connections:    Talks on phone: Not on file    Gets together: Not on file    Attends religious service: Not on file    Active member of club or organization: Not on file    Attends meetings of clubs or organizations: Not on file    Relationship status: Not on file  . Intimate partner violence:    Fear of current or ex partner: Not on file    Emotionally abused: Not on file    Physically abused: Not on file    Forced sexual activity: Not on file  Other Topics Concern  . Not on file  Social History Narrative  . Not on file    FAMILY HISTORY Family History  Problem Relation Age of Onset  . Diabetes  Mother        type 2  . Coronary artery disease Mother   . Deep vein thrombosis Mother   . Colon cancer Father   . Asthma Brother   . Diabetes Brother        type 2    ALLERGIES:  is allergic to betadine [povidone iodine]; other; and sinus formula [cholestatin].  MEDICATIONS:  Current Outpatient Medications  Medication Sig Dispense Refill  . budesonide-formoterol (SYMBICORT) 80-4.5 MCG/ACT inhaler Inhale 2 puffs into the lungs 2 (two) times daily. 3 Inhaler 3  . cholecalciferol (VITAMIN D) 1000 units tablet Take 1,000 Units by mouth daily.    . feeding supplement (BOOST / RESOURCE BREEZE) LIQD Take 1 Container by mouth 3 (three) times daily between meals. 30 Container 0  . gabapentin (NEURONTIN) 400 MG capsule TAKE 1 CAPSULE TWICE DAILY (Patient taking differently: Take 400 mg by mouth 2 (two) times daily. ) 180 capsule 4  . HYDROcodone-acetaminophen (NORCO) 7.5-325 MG tablet Take 1-2 tablets by mouth every 6 (six) hours as needed for moderate pain. 120 tablet 0  . ibuprofen (ADVIL,MOTRIN) 200 MG tablet Take 400 mg by mouth daily as needed for headache or moderate pain.    . meloxicam (MOBIC) 15 MG tablet TAKE 1 TABLET EVERY DAY AS NEEDED 90 tablet 3  . montelukast (SINGULAIR) 10 MG tablet Take 1 tablet (10 mg total) by mouth daily. 90 tablet 1  . omeprazole (PRILOSEC) 20 MG capsule TAKE 1 CAPSULE EVERY DAY (Patient taking differently: Take 20 mg by mouth daily. ) 90 capsule 4  . pravastatin (PRAVACHOL) 40 MG tablet TAKE 1 TABLET EVERY DAY (Patient taking differently: Take 40 mg by mouth daily. ) 90 tablet 3  . Probiotic Product (PROBIOTIC DAILY PO) Take 1 capsule by mouth daily.     Marland Kitchen pyridOXINE (VITAMIN B-6) 50 MG tablet Take 1 tablet (50 mg total) by mouth daily. 30 tablet 3  . Triamcinolone Acetonide (NASACORT ALLERGY 24HR NA) Place 2 sprays into the nose daily.     . vancomycin (VANCOCIN) 125 MG capsule Take 1 capsule (125 mg total) by mouth 4 (four) times daily. 56 capsule 1  .  vitamin B-12 (CYANOCOBALAMIN) 1000 MCG tablet Take 1,000 mcg by mouth daily.    . ondansetron (ZOFRAN) 4 MG tablet Take 0.5-1 tablets (2-4 mg total) by mouth every 8 (eight) hours as needed for nausea or vomiting. (Patient not taking: Reported on 09/18/2018) 20 tablet 0   No current facility-administered medications for this visit.    Facility-Administered Medications Ordered in Other Visits  Medication Dose Route Frequency Provider Last Rate Last Dose  .  0.9 %  sodium chloride infusion   Intravenous Continuous Earlie Server, MD   Stopped at 07/22/17 1150  . heparin lock flush 100 unit/mL  500 Units Intravenous Once Earlie Server, MD      . heparin lock flush 100 unit/mL  500 Units Intravenous Once Earlie Server, MD      . heparin lock flush 100 unit/mL  500 Units Intravenous Once Earlie Server, MD      . sodium chloride flush (NS) 0.9 % injection 10 mL  10 mL Intracatheter PRN Earlie Server, MD      . sodium chloride flush (NS) 0.9 % injection 10 mL  10 mL Intravenous PRN Earlie Server, MD   10 mL at 05/29/17 1347    PHYSICAL EXAMINATION:  ECOG PERFORMANCE STATUS: 1 - Symptomatic but completely ambulatory  Vitals:   09/18/18 1423  BP: 138/84  Pulse: 99  Resp: 18  Temp: 98 F (36.7 C)   Filed Weights   09/18/18 1423  Weight: 158 lb (71.7 kg)    Physical Exam  Constitutional: She is oriented to person, place, and time. No distress.  HENT:  Head: Normocephalic and atraumatic.  Nose: Nose normal.  Mouth/Throat: Oropharynx is clear and moist. No oropharyngeal exudate.  Eyes: Pupils are equal, round, and reactive to light. Conjunctivae and EOM are normal. Left eye exhibits no discharge. No scleral icterus.  Neck: Normal range of motion. Neck supple.  Cardiovascular: Normal rate, regular rhythm and normal heart sounds.  No murmur heard. Pulmonary/Chest: Effort normal. No respiratory distress. She has no wheezes. She has no rales. She exhibits no tenderness.  Abdominal: Soft. She exhibits no distension. There  is no abdominal tenderness.  Musculoskeletal: Normal range of motion.        General: No edema.  Lymphadenopathy:    She has no cervical adenopathy.  Neurological: She is alert and oriented to person, place, and time. No cranial nerve deficit. She exhibits normal muscle tone. Gait normal. Coordination normal.  Skin: Skin is warm and dry. She is not diaphoretic. No erythema.  Psychiatric: Affect and judgment normal.      LABORATORY DATA: I have personally reviewed the data as listed: CBC Latest Ref Rng & Units 09/03/2018 09/02/2018 09/01/2018  WBC 4.0 - 10.5 K/uL 8.9 17.9(H) 18.7(H)  Hemoglobin 12.0 - 15.0 g/dL 10.7(L) 11.3(L) 12.8  Hematocrit 36.0 - 46.0 % 34.2(L) 36.4 39.6  Platelets 150 - 400 K/uL 352 353 502(H)   CMP Latest Ref Rng & Units 09/03/2018 09/02/2018 09/01/2018  Glucose 70 - 99 mg/dL 99 113(H) 106(H)  BUN 8 - 23 mg/dL _0 Creatinine 0.44 - 1.00 mg/dL 0.65 0.55 0.65  Sodium 135 - 145 mmol/L 141 138 134(L)  Potassium 3.5 - 5.1 mmol/L 3.5 3.6 4.0  Chloride 98 - 111 mmol/L 105 107 99  CO2 22 - 32 mmol/L _1 Calcium 8.9 - 10.3 mg/dL 8.0(L) 8.0(L) 8.6(L)  Total Protein 6.5 - 8.1 g/dL - - 6.8  Total Bilirubin 0.3 - 1.2 mg/dL - - 0.4  Alkaline Phos 38 - 126 U/L - - 99  AST 15 - 41 U/L - - 13(L)  ALT 0 - 44 U/L - - 9    PATHOLOGY DIAGNOSIS:  A. COLON MASS, TRANSVERSE; TRANSVERSE COLECTOMY:  - INVASIVE ADENOCARCINOMA, MODERATELY DIFFERENTIATED.  - THREE OF SIXTEEN LYMPH NODES INVOLVED BY METASTASIS (3/16).  - LYMPHOVASCULAR AND PERINEURAL INVASION PRESENT.  - SEE SUMMARY BELOW.  Surgical Pathology Cancer Case Summary  COLON AND  RECTUM:  Procedure: transverse colectomy  Tumor Site:  transverse colon  Tumor Size: Greatest dimension: 2.3 cm  Macroscopic Tumor Perforation: not specified  Histologic Type: adenocarcinoma  Histologic Grade: G2, moderately differentiated  Tumor Extension: tumor invades the visceral peritoneum  Margins: all margins are  uninvolved by invasive carcinoma, high-grade dysplasia, intramucosal adenocarcinoma, and adenoma  Treatment Effect: no known presurgical therapy  Lymphovascular Invasion: present  Perineural Invasion: present  Tumor Deposits: not identified  Regional Lymph Nodes: # examined: 16  # involved: 3  Pathologic Stage Classification (pTNM, AJCC 8th Edition): pT4a pN1b   ADDENDUM:   MICROSATELLITE INSTABILITY IMMUNOHISTOCHEMISTRY  MISMATCH REPAIR PROTEINS:   MLH1: Intact nuclear expression  MSH2: Intact nuclear expression  MSH6: Intact nuclear expression  PMS2: Intact nuclear expression   Interpretation: No loss of nuclear expression of mismatch repair  proteins: Low probability of MSI-H.   RADIOGRAPHIC STUDIES: I have personally reviewed the radiological images as listed and agreed with the findings in the report. CT abdomen pelvis 09/14/2018  Status post percutaneous drainage of a right anterior abdominal wall abscess with resolution of the previously seen abscess cavity. Postsurgical changes in the proximal to mid transverse colon with some surrounding fat stranding likely representing scarring given its chronicity. The previously seen air locules are no longer identified and more likely postoperative in nature. No intra-abdominal abscess is seen.Stable adrenal lesions bilaterally. No new focal abnormality to correspond with the patient's given clinical history is seen. Diverticulosis without diverticulitis is noted.   ASSESSMENT/PLAN 72yo female who has MSI  stable stage IIIB Colon Cancer finishing adjuvant 5-FU   Cancer Staging Malignant neoplasm of transverse colon [facility]) Staging form: Colon and Rectum, AJCC 8th Edition - Clinical stage from 04/15/2017: Stage IIIB (cT4a, cN1b, cM0) - Signed by Earlie Server, MD on 04/26/2017  1. Malignant neoplasm of transverse colon ([place])   2. Diarrhea of infectious origin    #Stage III colon cancer, S/p Adjuvant chemotherapy and RT.  CT 09/14/2018  independently reviewed and discussed with patient. Still recovering from recent infection.  I will hold checking patient's tumor marker today to avoid false plosive readings.   # Persistent neutrophilia and monocytosis, BCR ABL. FISH of peripheral blood came back negative. Flow cytometry showed absolute monocytosis with phenotypic aberrancy. Continue monitor.   # Grade 2 neuropathy: Pre-existing peripheral neuropathy, stable.    Patient knows to call if any questions or concerns. Return in 2 months.  Earlie Server, MD, PhD Hematology Oncology [facility] Pager- [phone] 09/19/18

## 2018-09-22 ENCOUNTER — Other Ambulatory Visit: Payer: Self-pay | Admitting: Family Medicine

## 2018-09-22 DIAGNOSIS — M5136 Other intervertebral disc degeneration, lumbar region: Secondary | ICD-10-CM

## 2018-09-24 MED ORDER — ONDANSETRON HCL 4 MG PO TABS: 2.0000 mg | ORAL_TABLET | Freq: Three times a day (TID) | ORAL | 0 refills | Status: DC | PRN

## 2018-09-24 MED ORDER — HYDROCODONE-ACETAMINOPHEN 7.5-325 MG PO TABS: 1.0000 | ORAL_TABLET | Freq: Four times a day (QID) | ORAL | 0 refills | Status: DC | PRN

## 2018-09-25 ENCOUNTER — Telehealth: Payer: Self-pay | Admitting: Surgery

## 2018-09-25 ENCOUNTER — Other Ambulatory Visit: Payer: Self-pay

## 2018-09-25 ENCOUNTER — Telehealth: Payer: Self-pay

## 2018-09-25 ENCOUNTER — Ambulatory Visit (INDEPENDENT_AMBULATORY_CARE_PROVIDER_SITE_OTHER): Payer: Medicare HMO | Admitting: Surgery

## 2018-09-25 ENCOUNTER — Encounter: Payer: Self-pay | Admitting: Surgery

## 2018-09-25 VITALS — BP 147/84 | HR 90 | Temp 97.7°F | Resp 18 | Ht 65.0 in | Wt 158.0 lb

## 2018-09-25 DIAGNOSIS — Z09 Encounter for follow-up examination after completed treatment for conditions other than malignant neoplasm: Secondary | ICD-10-CM

## 2018-09-25 DIAGNOSIS — L02211 Cutaneous abscess of abdominal wall: Secondary | ICD-10-CM | POA: Diagnosis not present

## 2018-09-25 MED ORDER — METRONIDAZOLE 500 MG PO TABS: 500.0000 mg | ORAL_TABLET | Freq: Three times a day (TID) | ORAL | 0 refills | Status: AC

## 2018-09-25 MED ORDER — VANCOMYCIN HCL 125 MG PO CAPS: 125.0000 mg | ORAL_CAPSULE | Freq: Four times a day (QID) | ORAL | 0 refills | Status: AC

## 2018-09-25 MED ORDER — CIPROFLOXACIN HCL 500 MG PO TABS: 500.0000 mg | ORAL_TABLET | Freq: Two times a day (BID) | ORAL | 0 refills | Status: AC

## 2018-09-25 NOTE — Telephone Encounter (Signed)
Took specimen culture upstairs  Denise in the lab at [place] lab for 5 pm pick up. We closed early on Friday and I did not want specimen to sit idle over the weekend.

## 2018-09-25 NOTE — Telephone Encounter (Signed)
I have called patient back after speaking with Dr Dahlia Byes. Dr Dahlia Byes wanted to see the patient in the office today after his scheduled surgery case. Patient has agreed to come in today at 1:00pm to see Dr Dahlia Byes.

## 2018-09-25 NOTE — Progress Notes (Signed)
09/25/2018  HPI: Kimberly [name] is a 72 y.o. female s/p end ileostomy takedown with takedown of colonic mucous fistula and ileocolostomy anastomosis.  She has had issues with abdominal wall abscess and had a drain placed on 12/4.  Drain was removed on 12/18 and she completed a course of cipro and flagyl.  She had complication with c difficile and was on oral vancomycin as well.  She has two more days of vancomycin.    Patient reports that starting a couple days ago she started having more discomfort at the prior abscess site.  Today the wound is more indurated and has erythema and tenderness.  Denies any drainage from the area and denies any fevers or chills.  Vital signs: BP (!) 147/84   Pulse 90   Temp 97.7 F (36.5 C) (Temporal)   Resp 18   Ht 5\' 5"  (1.651 m)   Wt 158 lb (71.7 kg)   SpO2 94%   BMI 26.29 kg/m    Physical Exam: Constitutional: No acute distress Abdomen:  Soft, nondistended, with tenderness to palpation over medial portion of end ileostomy site.  At this location, the medial corner has erythema, induration, and some fluctuance.  No active drainage.  Assessment/Plan: This is a 72 y.o. female s/p end ileostomy takedown with takedown of colonic mucous fistula and ileocolostomy anastomosis.  Discussed with the patient that most likely she has a recurrent abscess at the site of prior abscess drainage.  After discussing with Dr. Dahlia Byes, I offered the patient I&D at bedside in the office.  I do not think that a CT scan would be of benefit as her abscess has been anterior to the fascia and she has had several CT scans recently that would be better to avoid the additional radiation.  Discussed with the patient the role for I&D, including risk of bleeding, injury to surrounding structures, and need for further procedures.  Discussed the plan of leaving wick of gauze and her having to do dressing/packing changes at home.  She feels comfortable with this plan and is willing to  proceed.   Procedure Date:  09/25/2018  Pre-operative Diagnosis:  Right lower quadrant abdominal wall abscess  Post-operative Diagnosis:  Right lower quadrant abdominal wall abscess  Procedure:  Incision and Drainage of right lower quadrant abdominal wall abscess  Surgeon:  Melvyn Neth, MD  Anesthesia:  5 ml of 1% lidocaine  Estimated Blood Loss:  3 ml  Specimens:  Culture swab  Complications:  None  Indications for Procedure:  This is a 72 y.o. female with diagnosis of right lower quadrant abdominal wall abscess, requiring drainage procedure.  The risks of bleeding, abscess or infection, injury to surrounding structures, and need for further procedures were all discussed with the patient and was willing to proceed.  Description of Procedure: The patient was correctly identified at bedside.  Appropriate time-outs were performed prior to procedure.  The patient's right lower quadrant was prepped and draped in usual sterile fashion.  Local anesthetic was infused intradermally.  A cruciate 3 cm incision was made over the abscess, revealing purulent fluid.  This fluid was swabbed for culture and sent to micro.  Small Kelly forceps were used to dissect around the abscess tissue to open any remaining pockets of purulent fluid.  After drainage was completed, the cavity was irrigated and cleaned.  The wound was packed with 1/2 inch gauze and covered with dry gauze and tape.  The patient tolerated the procedure well and all sharps  were appropriately disposed of at the end of the case.   --Patient will follow up next week for wound check. --She will be given new course of cipro and flagyl for 1 week and her vancomycin will be extended so she has 10 more days of vancomycin after cipro/flagyl are done.   Melvyn Neth, [facility]

## 2018-09-25 NOTE — Telephone Encounter (Signed)
Patient has called this morning and stated that she noticed a knot that has came up on her abdomen. She has had a previous abdominal wall abscess in the past. Patient had surgery on 06/25/18 with Dr Hal Morales takedown, hernia repair parastomal.   She states that knot came up last week. The knot has now became very painful-a consistent pain-for two days. Patient states that is it very red and there is heat to the area.   She would like to be seen before the weekend to make sure there is no infection or abscess.   Please call patient back at [phone] .

## 2018-09-25 NOTE — Patient Instructions (Signed)
Please remove the old wick on Sunday from the wound. You may shower as usual but remove the old packing prior to showering and then replace with new wick after showering.   Please see your appointment listed below.  Please pick up your medication at the pharmacy.

## 2018-09-29 LAB — ANAEROBIC AND AEROBIC CULTURE

## 2018-10-01 ENCOUNTER — Other Ambulatory Visit: Payer: Self-pay

## 2018-10-01 ENCOUNTER — Ambulatory Visit (INDEPENDENT_AMBULATORY_CARE_PROVIDER_SITE_OTHER): Payer: Medicare HMO | Admitting: Surgery

## 2018-10-01 ENCOUNTER — Encounter: Payer: Self-pay | Admitting: Surgery

## 2018-10-01 VITALS — BP 164/100 | HR 89 | Temp 97.5°F | Resp 20 | Ht 65.0 in | Wt 159.0 lb

## 2018-10-01 DIAGNOSIS — L02211 Cutaneous abscess of abdominal wall: Secondary | ICD-10-CM

## 2018-10-01 NOTE — Progress Notes (Signed)
Surgical Clinic Progress/Follow-up Note   HPI:  73 y.o. Female presents to clinic for follow-up evaluation 6 Days s/p in-office incision and drainage of recurrent RLQ abscess ([place]oya, 09/25/2018) at former ileostomy site following recent ileostomy reversal and subsequent drainage of abdominal wall abscess. Patient reports resolution of pre-drainage RLQ abdominal pain and has been tolerating regular diet with +flatus and normal BM's, denies N/V, fever/chills, CP, or SOB. She has been changing packing daily and taking antibiotics as prescribed.  Review of Systems:  Constitutional: denies fever/chills  Respiratory: denies shortness of breath, wheezing  Cardiovascular: denies chest pain, palpitations  Gastrointestinal: abdominal pain, N/V, and bowel function as per interval history Skin: Denies any other rashes or skin discolorations except post-surgical wounds as per interval history  Vital Signs:  BP (!) 164/100   Pulse 89   Temp (!) 97.5 F (36.4 C) (Skin)   Resp 20   Ht 5\' 5"  (1.651 m)   Wt 159 lb (72.1 kg)   SpO2 94%   BMI 26.46 kg/m    Physical Exam:  Constitutional:  -- Normal body habitus  -- Awake, alert, and oriented x3  Pulmonary:  -- No crackles -- Equal breath sounds bilaterally -- Breathing non-labored at rest Cardiovascular:  -- S1, S2 present  -- No pericardial rubs  Gastrointestinal:  -- Soft and non-distended, non-tender to palpation, no guarding/rebound tenderness -- RLQ incision and drainage site packed with a small amount of gauze without any peri-incisional erythema or purulent drainage -- No abdominal masses appreciated, pulsatile or otherwise  Musculoskeletal / Integumentary:  -- Wounds or skin discoloration: None appreciated except post-surgical incisions as described above (GI) -- Extremities: B/L UE and LE FROM, hands and feet warm, no edema   Imaging: No new pertinent imaging available for review  Assessment:  73 y.o. yo Female with a problem  list including...  Patient Active Problem List   Diagnosis Date Noted  . Postprocedural intraabdominal abscess 09/01/2018  . Dependence on nocturnal oxygen therapy 08/12/2018  . Clostridium difficile colitis 07/31/2018  . S/P colostomy takedown 06/25/2018  . Parastomal hernia without obstruction or gangrene   . Status post colon resection   . Visit for diagnostic endoscopy   . Colostomy status ([place])   . Colostomy prolapse ([place])   . Hypokalemia 05/27/2017  . Hypomagnesemia 05/27/2017  . Iron deficiency anemia 05/27/2017  . Port-A-Cath in place   . Left peroneal vein thrombosis   . Malignant neoplasm of transverse colon ([place]) 04/26/2017  . Cellulitis   . Generalized abdominal pain   . Abdominal pain, generalized   . Loss of weight   . Gastritis without bleeding   . Absence of bladder continence 04/01/2017  . Asthmatic breathing 04/01/2017  . Abnormal abdominal x-ray 02/11/2017  . OP (osteoporosis) 02/11/2017  . Skin lesion 02/11/2017  . Breast swelling 02/11/2017  . Narrowing of intervertebral disc space 02/11/2017  . Family history of colon cancer 02/11/2017  . Foot pain 02/11/2017  . Arthralgia of hip 02/11/2017  . Personal history of malignant neoplasm of breast 02/11/2017  . Eczema intertrigo 02/11/2017  . Neuropathy 02/11/2017  . Osteoarthrosis, unspecified whether generalized or localized, involving lower leg 02/11/2017  . Coronary atherosclerosis 01/29/2017  . Smoking greater than 30 pack years 01/10/2017  . Vitamin D deficiency 08/21/2016  . Hyperlipidemia 11/10/2015  . Chronic back pain 05/26/2015  . DDD (degenerative disc disease), lumbosacral 05/26/2015  . History of colon polyps 04/27/2007    presents to clinic for follow-up evaluation, doing well  6 Days s/p in-office incision and drainage of recurrent RLQ abscess ([place]oya, 09/25/2018) at former ileostomy site following recent ileostomy reversal and subsequent drainage of abdominal wall abscess.  Plan:   -  complete antibiotics as prescribed  - continue current wound care/packing  - okay to shower, but do not submerge incision under water (bath/swimming) until healed  - return to clinic for follow-up with Dr. Dahlia Byes 1/8 as scheduled  All of the above recommendations were discussed with the patient, and all of patient's questions were answered to her expressed satisfaction.  -- Marilynne Drivers Rosana Hoes, MD, [place] [facility]: [facility] - Partnering for exceptional care. Office: [phone]

## 2018-10-01 NOTE — Patient Instructions (Signed)
The patient is aware to call back for any questions or new concerns. Continue dressing changes daily Follow up with Dr Dahlia Byes as scheduled,

## 2018-10-07 ENCOUNTER — Ambulatory Visit: Payer: Medicare HMO | Admitting: Surgery

## 2018-10-07 ENCOUNTER — Other Ambulatory Visit: Payer: Self-pay

## 2018-10-07 ENCOUNTER — Encounter: Payer: Self-pay | Admitting: Surgery

## 2018-10-07 VITALS — BP 132/82 | HR 92 | Temp 97.9°F | Resp 16 | Ht 65.0 in | Wt 160.0 lb

## 2018-10-07 DIAGNOSIS — Z09 Encounter for follow-up examination after completed treatment for conditions other than malignant neoplasm: Secondary | ICD-10-CM

## 2018-10-07 NOTE — Patient Instructions (Signed)
Please see your follow up appointment listed below.  °

## 2018-10-08 ENCOUNTER — Encounter: Payer: Self-pay | Admitting: Surgery

## 2018-10-08 NOTE — Progress Notes (Signed)
S/p ileostomy takedown and parastomal hernia w Phasix Developed abd wall abscess s/p I/D She is doing well Taking PO, no fevers Normal Bms Minimal drainage, packing wound daily   PE NAD Abd; wound measured 55mm width x 8 mm depth. No erythema or evidence of abscess or infection  A/P Doing well No need for packing or further antibiotics RTC 2-3 weeks

## 2018-10-12 NOTE — Addendum Note (Signed)
Addended by: Riki Sheer on: 10/12/2018 04:33 PM   Modules accepted: Level of Service

## 2018-10-13 DIAGNOSIS — J441 Chronic obstructive pulmonary disease with (acute) exacerbation: Secondary | ICD-10-CM | POA: Diagnosis not present

## 2018-10-13 DIAGNOSIS — R0602 Shortness of breath: Secondary | ICD-10-CM | POA: Diagnosis not present

## 2018-10-13 DIAGNOSIS — S72046A Nondisplaced fracture of base of neck of unspecified femur, initial encounter for closed fracture: Secondary | ICD-10-CM | POA: Diagnosis not present

## 2018-10-21 ENCOUNTER — Encounter: Payer: Self-pay | Admitting: Surgery

## 2018-10-23 ENCOUNTER — Other Ambulatory Visit: Payer: Self-pay | Admitting: Family Medicine

## 2018-10-23 DIAGNOSIS — M51369 Other intervertebral disc degeneration, lumbar region without mention of lumbar back pain or lower extremity pain: Secondary | ICD-10-CM

## 2018-10-23 DIAGNOSIS — M5136 Other intervertebral disc degeneration, lumbar region: Secondary | ICD-10-CM

## 2018-10-26 MED ORDER — HYDROCODONE-ACETAMINOPHEN 7.5-325 MG PO TABS: 1.0000 | ORAL_TABLET | Freq: Four times a day (QID) | ORAL | 0 refills | Status: DC | PRN

## 2018-11-13 DIAGNOSIS — S72046A Nondisplaced fracture of base of neck of unspecified femur, initial encounter for closed fracture: Secondary | ICD-10-CM | POA: Diagnosis not present

## 2018-11-13 DIAGNOSIS — J441 Chronic obstructive pulmonary disease with (acute) exacerbation: Secondary | ICD-10-CM | POA: Diagnosis not present

## 2018-11-13 DIAGNOSIS — R0602 Shortness of breath: Secondary | ICD-10-CM | POA: Diagnosis not present

## 2018-11-17 ENCOUNTER — Inpatient Hospital Stay: Payer: Medicare HMO | Attending: Oncology | Admitting: Oncology

## 2018-11-17 ENCOUNTER — Other Ambulatory Visit: Payer: Self-pay

## 2018-11-17 ENCOUNTER — Encounter: Payer: Self-pay | Admitting: Oncology

## 2018-11-17 ENCOUNTER — Inpatient Hospital Stay: Payer: Medicare HMO

## 2018-11-17 VITALS — BP 160/95 | HR 76 | Temp 98.0°F | Resp 18 | Wt 160.1 lb

## 2018-11-17 DIAGNOSIS — C184 Malignant neoplasm of transverse colon: Secondary | ICD-10-CM | POA: Diagnosis not present

## 2018-11-17 DIAGNOSIS — R5383 Other fatigue: Secondary | ICD-10-CM

## 2018-11-17 DIAGNOSIS — R970 Elevated carcinoembryonic antigen [CEA]: Secondary | ICD-10-CM

## 2018-11-17 DIAGNOSIS — K219 Gastro-esophageal reflux disease without esophagitis: Secondary | ICD-10-CM

## 2018-11-17 DIAGNOSIS — Z87891 Personal history of nicotine dependence: Secondary | ICD-10-CM | POA: Diagnosis not present

## 2018-11-17 DIAGNOSIS — E785 Hyperlipidemia, unspecified: Secondary | ICD-10-CM | POA: Diagnosis not present

## 2018-11-17 DIAGNOSIS — Z853 Personal history of malignant neoplasm of breast: Secondary | ICD-10-CM

## 2018-11-17 DIAGNOSIS — D72821 Monocytosis (symptomatic): Secondary | ICD-10-CM | POA: Diagnosis not present

## 2018-11-17 DIAGNOSIS — Z791 Long term (current) use of non-steroidal anti-inflammatories (NSAID): Secondary | ICD-10-CM

## 2018-11-17 DIAGNOSIS — Z79899 Other long term (current) drug therapy: Secondary | ICD-10-CM | POA: Diagnosis not present

## 2018-11-17 DIAGNOSIS — Z8249 Family history of ischemic heart disease and other diseases of the circulatory system: Secondary | ICD-10-CM | POA: Diagnosis not present

## 2018-11-17 DIAGNOSIS — A0471 Enterocolitis due to Clostridium difficile, recurrent: Secondary | ICD-10-CM | POA: Diagnosis not present

## 2018-11-17 LAB — COMPREHENSIVE METABOLIC PANEL
ALT: 21 U/L (ref 0–44)
AST: 22 U/L (ref 15–41)
Albumin: 3.8 g/dL (ref 3.5–5.0)
Alkaline Phosphatase: 87 U/L (ref 38–126)
Anion gap: 9 (ref 5–15)
BUN: 14 mg/dL (ref 8–23)
CHLORIDE: 101 mmol/L (ref 98–111)
CO2: 26 mmol/L (ref 22–32)
CREATININE: 0.76 mg/dL (ref 0.44–1.00)
Calcium: 8.9 mg/dL (ref 8.9–10.3)
GFR calc Af Amer: >60 mL/min (ref >60)
GFR calc non Af Amer: >60 mL/min (ref >60)
Glucose, Bld: 93 mg/dL (ref 70–99)
POTASSIUM: 4.3 mmol/L (ref 3.5–5.1)
Sodium: 136 mmol/L (ref 135–145)
Total Bilirubin: 0.4 mg/dL (ref 0.3–1.2)
Total Protein: 8 g/dL (ref 6.5–8.1)

## 2018-11-17 LAB — CBC WITH DIFFERENTIAL/PLATELET
Abs Immature Granulocytes: 0.02 10*3/uL (ref 0.00–0.07)
Basophils Absolute: 0.1 10*3/uL (ref 0.0–0.1)
Basophils Relative: 1 %
Eosinophils Absolute: 0.1 10*3/uL (ref 0.0–0.5)
Eosinophils Relative: 1 %
HCT: 42.3 % (ref 36.0–46.0)
Hemoglobin: 13.6 g/dL (ref 12.0–15.0)
Immature Granulocytes: 0 %
Lymphocytes Relative: 19 %
Lymphs Abs: 2 10*3/uL (ref 0.7–4.0)
MCH: 27.9 pg (ref 26.0–34.0)
MCHC: 32.2 g/dL (ref 30.0–36.0)
MCV: 86.9 fL (ref 80.0–100.0)
Monocytes Absolute: 1 10*3/uL (ref 0.1–1.0)
Monocytes Relative: 10 %
Neutro Abs: 7 10*3/uL (ref 1.7–7.7)
Neutrophils Relative %: 69 %
Platelets: 333 10*3/uL (ref 150–400)
RBC: 4.87 MIL/uL (ref 3.87–5.11)
RDW: 15.1 % (ref 11.5–15.5)
WBC: 10.3 10*3/uL (ref 4.0–10.5)
nRBC: 0 % (ref 0.0–0.2)

## 2018-11-17 MED ORDER — GABAPENTIN 400 MG PO CAPS: 400.0000 mg | ORAL_CAPSULE | Freq: Three times a day (TID) | ORAL | 1 refills | Status: DC

## 2018-11-17 NOTE — Progress Notes (Signed)
Patient here for follow up. Pt states that every now and then she get a sharp pain to incision site on abdomen. "it feels like its trying to split."

## 2018-11-18 LAB — CEA: CEA: 4.9 ng/mL — ABNORMAL HIGH (ref 0.0–4.7)

## 2018-11-18 NOTE — Progress Notes (Signed)
[facility] Follow up  Visit:  Patient Care Team: Birdie Sons, MD as PCP - General (Family Medicine) Earlie Server, MD as Consulting Physician (Oncology) Noreene Filbert, MD as Referring Physician (Radiation Oncology) Jules Husbands, MD as Consulting Physician (General Surgery)  PURPOSE OF VISIT: Adjuvant chemotherapy for colon cancer  HISTORY OF PRESENTING ILLNESS: Kimberly [name] 73 y.o. female with PMH listed as below presents for follow up for the management of Stage IIIB Colon Cancer Patient reports a remote history of breast cancer s/p lumpectomy and radiation treatments.   Patient had been having abdominal pain, weight loss and bloating.  CT scan showed partial obstruction at the level of transverse colon and ileocolic mesenteric adenopathy. She was prepared for a colonoscopy on 04/15/2017. She started to have worsened abdominal pain, nausea vomiting, unable to maintain oral intake and was sent to ED. She was found to have bowel obstruction and underwent  exploratory laparotomy with transverse colectomy, with end colostomy and mucous fistula formation for obstructing colon lesion. Differential diagnosis prior to surgery was metastatic breast cancer in colon vs colon cancer. The patient did have a colonoscopy 2 years ago without the mass being present at that time but did have 2 small polyps in the transverse colon that were removed.  04/15/2017 Patient underwent transverse colon resection.  Pathology showed T4aN1 moderate differentiated adenocarcinoma, Grade 2, tumor invades visceral peritoneum, LVI present, negative margin, 3/16 lymph nodes involved with cancer. Low probability of MSI-H.   # Genetic test INVITAE came back negative. No pathogenetic sequence variants or deletions/duplications identified. Results was scanned to [employer] (a copy of the report was provided to patient and her daughter)  # # baseline CEA is 2.1 # colostomy reversal on 06/25/2018, subsequently  developed C. difficile colitis x 2.  Multiple abdominal CT abdomen and pelvis for obtained, not listed here #09/01/2018 CT abdomen pelvis showed abdominal wall abscess,  percutaneous drain placement .treatment with antibiotics with Augmentin 10 days course.  #09/14/2089 CT abdomen/pelvis showed resolution of abscess, no intraabdominal abscess.     TREATMENT:  04/15/2017 transverse colon resection 05/27/2017- 11/17/2017 adjuvant FOLFOX, Oxaliplatin was discontinued after 3 cycles due to worsening of pre-existing neuropathy. Finished another 9 cycles of 5-FU.  March -April 2019 adjuvant RT.  06/25/2018 Colostomy Reversal.    INTERVAL HISTORY 73 y.o. female  presents for follow-up of Stage IIIB colon cancer. Patient was last seen December 2019 and at that time she is still having ongoing recurrent C. difficile colitis and was undergoing treatment. Today she reports feeling much better.  Diarrhea has completely resolved.  Fatigue is better.  Denies any abdominal pain, nausea vomiting.   Review of Systems  Constitutional: Negative for appetite change, chills, fatigue, fever and unexpected weight change.  HENT:   Negative for hearing loss, lump/mass, nosebleeds and voice change.   Eyes: Negative for eye problems.  Respiratory: Negative for chest tightness, cough and shortness of breath.   Cardiovascular: Negative for chest pain and leg swelling.  Gastrointestinal: Negative for abdominal distention, abdominal pain, blood in stool, constipation, diarrhea and nausea.  Endocrine: Negative for hot flashes.  Genitourinary: Negative for difficulty urinating, dysuria and frequency.   Musculoskeletal: Negative for arthralgias, gait problem and myalgias.  Skin: Negative for itching, rash and wound.  Neurological: Negative for dizziness, extremity weakness, gait problem and headaches.       Chronic numbness and tingling of fingertips and toes.  Hematological: Negative for adenopathy. Does not bruise/bleed  easily.  Psychiatric/Behavioral:  Negative for confusion, decreased concentration and depression. The patient is not nervous/anxious.       MEDICAL HISTORY: Past Medical History:  Diagnosis Date  . Breast cancer, left (HCC)    Lumpectomy and rad tx's.  . Chronic back pain   . Colon cancer ([place])   . COPD (chronic obstructive pulmonary disease) ([place])   . Degenerative disc disease, lumbar 04/2013  . Dyspnea   . Family history of adverse reaction to anesthesia    son arrested after anesthesia  . GERD (gastroesophageal reflux disease)   . Hyperlipidemia   . Iron deficiency anemia 05/27/2017  . Neuropathy   . Osteoarthritis    of knee  . Osteoporosis   . Small bowel obstruction ([place]) 04/16/2017  . Tobacco abuse   . Vitamin D deficiency     SURGICAL HISTORY: Past Surgical History:  Procedure Laterality Date  . APPENDECTOMY  1965  . BREAST EXCISIONAL BIOPSY Left 08/01/2003   lumpectomy rad 11/04-2/28/2005  . BREAST SURGERY    . CESAREAN SECTION     x3  . COLON SURGERY    . COLONOSCOPY W/ POLYPECTOMY    . COLONOSCOPY WITH PROPOFOL N/A 04/09/2018   Procedure: COLONOSCOPY WITH PROPOFOL;  Surgeon: Virgel Manifold, MD;  Location: [facility];  Service: Gastroenterology;  Laterality: N/A;  . COLOSTOMY TAKEDOWN N/A 06/25/2018   Procedure: COLOSTOMY TAKEDOWN;  Surgeon: Jules Husbands, MD;  Location: [facility] ORS;  Service: General;  Laterality: N/A;  . ESOPHAGOGASTRODUODENOSCOPY (EGD) WITH PROPOFOL N/A 04/04/2017   Procedure: ESOPHAGOGASTRODUODENOSCOPY (EGD) WITH PROPOFOL;  Surgeon: [name], MD;  Location: [facility];  Service: Endoscopy;  Laterality: N/A;  . FRACTURE SURGERY    . HIP FRACTURE SURGERY Left 01/25/2012  . JOINT REPLACEMENT    . LAPAROTOMY N/A 04/15/2017   Procedure: EXPLORATORY LAPAROTOMY;  Surgeon: Clayburn Pert, MD;  Location: [facility] ORS;  Service: General;  Laterality: N/A;  . NECK SURGERY  12/2011  . PARASTOMAL HERNIA REPAIR N/A 12/03/2017   Procedure:  HERNIA REPAIR PARASTOMAL;  Surgeon: Jules Husbands, MD;  Location: [facility] OR[facility];  Service: General;  Laterality: N/A;  . PARASTOMAL HERNIA REPAIR N/A 06/25/2018   Procedure: HERNIA REPAIR PARASTOMAL;  Surgeon: Jules Husbands, MD;  Location: [facility];  Service: General;  Laterality: N/A;  . PORTACATH PLACEMENT Right 05/21/2017   Procedure: INSERTION PORT-A-CATH;  Surgeon: Clayburn Pert, MD;  Location: [facility] ORS;  Service: General;  Laterality: Right;  . WRIST FRACTURE SURGERY      SOCIAL HISTORY: Social History   Socioeconomic History  . Marital status: Divorced    Spouse name: Not on file  . Number of children: 3  . Years of education: Not on file  . Highest education level: 7th grade  Occupational History  . Occupation: retired  Scientific laboratory technician  . Financial resource strain: Not hard at all  . Food insecurity:    Worry: Never true    Inability: Never true  . Transportation needs:    Medical: No    Non-medical: No  Tobacco Use  . Smoking status: Former Smoker    Packs/day: 0.25    Years: 55.00    Pack years: 13.75    Types: Cigarettes    Last attempt to quit: 05/21/2017    Years since quitting: 1.4  . Smokeless tobacco: Never Used  . Tobacco comment: started age 10 1/2 to 1 ppd  Substance and Sexual Activity  . Alcohol use: Yes    Alcohol/week: 0.0 standard drinks  Comment: occasionally drinks beer  . Drug use: No  . Sexual activity: Never  Lifestyle  . Physical activity:    Days per week: Not on file    Minutes per session: Not on file  . Stress: Not at all  Relationships  . Social connections:    Talks on phone: Not on file    Gets together: Not on file    Attends religious service: Not on file    Active member of club or organization: Not on file    Attends meetings of clubs or organizations: Not on file    Relationship status: Not on file  . Intimate partner violence:    Fear of current or ex partner: Not on file    Emotionally abused: Not on file    Physically  abused: Not on file    Forced sexual activity: Not on file  Other Topics Concern  . Not on file  Social History Narrative  . Not on file    FAMILY HISTORY Family History  Problem Relation Age of Onset  . Diabetes Mother        type 2  . Coronary artery disease Mother   . Deep vein thrombosis Mother   . Colon cancer Father   . Asthma Brother   . Diabetes Brother        type 2    ALLERGIES:  is allergic to betadine [povidone iodine]; other; and sinus formula [cholestatin].  MEDICATIONS:  Current Outpatient Medications  Medication Sig Dispense Refill  . budesonide-formoterol (SYMBICORT) 80-4.5 MCG/ACT inhaler Inhale 2 puffs into the lungs 2 (two) times daily. 3 Inhaler 3  . cholecalciferol (VITAMIN D) 1000 units tablet Take 1,000 Units by mouth daily.    . feeding supplement (BOOST / RESOURCE BREEZE) LIQD Take 1 Container by mouth 3 (three) times daily between meals. 30 Container 0  . gabapentin (NEURONTIN) 400 MG capsule Take 1 capsule (400 mg total) by mouth 3 (three) times daily. 270 capsule 1  . HYDROcodone-acetaminophen (NORCO) 7.5-325 MG tablet Take 1-2 tablets by mouth every 6 (six) hours as needed for moderate pain. 120 tablet 0  . meloxicam (MOBIC) 15 MG tablet TAKE 1 TABLET EVERY DAY AS NEEDED 90 tablet 3  . montelukast (SINGULAIR) 10 MG tablet Take 1 tablet (10 mg total) by mouth daily. 90 tablet 1  . omeprazole (PRILOSEC) 20 MG capsule TAKE 1 CAPSULE EVERY DAY (Patient taking differently: Take 20 mg by mouth daily. ) 90 capsule 4  . ondansetron (ZOFRAN) 4 MG tablet Take 0.5-1 tablets (2-4 mg total) by mouth every 8 (eight) hours as needed for nausea or vomiting. 20 tablet 0  . pravastatin (PRAVACHOL) 40 MG tablet TAKE 1 TABLET EVERY DAY (Patient taking differently: Take 40 mg by mouth daily. ) 90 tablet 3  . Probiotic Product (PROBIOTIC DAILY PO) Take 1 capsule by mouth daily.     Marland Kitchen pyridOXINE (VITAMIN B-6) 50 MG tablet Take 1 tablet (50 mg total) by mouth daily. 30  tablet 3  . Triamcinolone Acetonide (NASACORT ALLERGY 24HR NA) Place 2 sprays into the nose daily.     . vitamin B-12 (CYANOCOBALAMIN) 1000 MCG tablet Take 1,000 mcg by mouth daily.    Marland Kitchen ibuprofen (ADVIL,MOTRIN) 200 MG tablet Take 400 mg by mouth daily as needed for headache or moderate pain.    . vancomycin (VANCOCIN) 125 MG capsule Take 1 capsule (125 mg total) by mouth 4 (four) times daily. (Patient not taking: Reported on 11/17/2018) 56 capsule 1  No current facility-administered medications for this visit.    Facility-Administered Medications Ordered in Other Visits  Medication Dose Route Frequency Provider Last Rate Last Dose  . 0.9 %  sodium chloride infusion   Intravenous Continuous Earlie Server, MD   Stopped at 07/22/17 1150  . heparin lock flush 100 unit/mL  500 Units Intravenous Once Earlie Server, MD      . heparin lock flush 100 unit/mL  500 Units Intravenous Once Earlie Server, MD      . heparin lock flush 100 unit/mL  500 Units Intravenous Once Earlie Server, MD      . sodium chloride flush (NS) 0.9 % injection 10 mL  10 mL Intracatheter PRN Earlie Server, MD      . sodium chloride flush (NS) 0.9 % injection 10 mL  10 mL Intravenous PRN Earlie Server, MD   10 mL at 05/29/17 1347    PHYSICAL EXAMINATION:  ECOG PERFORMANCE STATUS: 1 - Symptomatic but completely ambulatory  Vitals:   11/17/18 1359  BP: (!) 160/95  Pulse: 76  Resp: 18  Temp: 98 F (36.7 C)   Filed Weights   11/17/18 1359  Weight: 160 lb 1.6 oz (72.6 kg)    Physical Exam  Constitutional: She is oriented to person, place, and time. No distress.  HENT:  Head: Normocephalic and atraumatic.  Nose: Nose normal.  Mouth/Throat: Oropharynx is clear and moist. No oropharyngeal exudate.  Eyes: Pupils are equal, round, and reactive to light. Conjunctivae and EOM are normal. Left eye exhibits no discharge. No scleral icterus.  Neck: Normal range of motion. Neck supple.  Cardiovascular: Normal rate, regular rhythm and normal heart sounds.   No murmur heard. Pulmonary/Chest: Effort normal. No respiratory distress. She has no wheezes. She has no rales. She exhibits no tenderness.  Abdominal: Soft. She exhibits no distension. There is no abdominal tenderness.  Musculoskeletal: Normal range of motion.        General: No edema.  Lymphadenopathy:    She has no cervical adenopathy.  Neurological: She is alert and oriented to person, place, and time. No cranial nerve deficit. She exhibits normal muscle tone. Gait normal. Coordination normal.  Skin: Skin is warm and dry. She is not diaphoretic. No erythema.  Psychiatric: Affect and judgment normal.      LABORATORY DATA: I have personally reviewed the data as listed: CBC Latest Ref Rng & Units 11/17/2018 09/03/2018 09/02/2018  WBC 4.0 - 10.5 K/uL 10.3 8.9 17.9(H)  Hemoglobin 12.0 - 15.0 g/dL 13.6 10.7(L) 11.3(L)  Hematocrit 36.0 - 46.0 % 42.3 34.2(L) 36.4  Platelets 150 - 400 K/uL 333 352 353   CMP Latest Ref Rng & Units 11/17/2018 09/03/2018 09/02/2018  Glucose 70 - 99 mg/dL 93 99 113(H)  BUN 8 - 23 mg/dL _0 Creatinine 0.44 - 1.00 mg/dL 0.76 0.65 0.55  Sodium 135 - 145 mmol/L 136 141 138  Potassium 3.5 - 5.1 mmol/L 4.3 3.5 3.6  Chloride 98 - 111 mmol/L 101 105 107  CO2 22 - 32 mmol/L _1 Calcium 8.9 - 10.3 mg/dL 8.9 8.0(L) 8.0(L)  Total Protein 6.5 - 8.1 g/dL 8.0 - -  Total Bilirubin 0.3 - 1.2 mg/dL 0.4 - -  Alkaline Phos 38 - 126 U/L 87 - -  AST 15 - 41 U/L 22 - -  ALT 0 - 44 U/L 21 - -   RADIOGRAPHIC STUDIES: I have personally reviewed the radiological images as listed and agreed with the findings in  the report.    ASSESSMENT/PLAN 73yo female who has MSI  stable stage IIIB Colon Cancer presents for follow up  Cancer Staging Malignant neoplasm of transverse colon [facility]) Staging form: Colon and Rectum, AJCC 8th Edition - Clinical stage from 04/15/2017: Stage IIIB (cT4a, cN1b, cM0) - Signed by Earlie Server, MD on 04/26/2017  1. Malignant neoplasm of transverse  colon ([place])   2. Personal history of malignant neoplasm of breast    #Stage III colon cancer Recovering from acute colitis.  Symptoms completely resolved. Check CBC, CMP, CEA  Labs reviewed today.  CEA elevated at 4.9.  Questionable from recent infection versus recurrence. Preoperative baseline was 2.1,normal.. CEA likely due to recent inflammation.  Recommend repeat CEA in 4 weeks and proceed with CT abdomen pelvis June 2020 for surveillance.   #Chronic neutrophilia and monocytosis, BCR ABL. FISH of peripheral blood came back negative. Flow cytometry showed absolute monocytosis with phenotypic aberrancy.  Repeat CBC showed complete normalization of her blood counts.  # History of breast Cancer, due for annual mammogram. Will obtain.  Orders Placed This Encounter  Procedures  . CT Chest Wo Contrast    Standing Status:   Future    Standing Expiration Date:   11/17/2019    Order Specific Question:   ** REASON FOR EXAM (FREE TEXT)    Answer:   History of colon cancer    Order Specific Question:   Preferred imaging location?    Answer:   [facility]    Order Specific Question:   Radiology Contrast Protocol - do NOT remove file path    Answer:   \\[email or web address]  . CT Abdomen Pelvis Wo Contrast    Standing Status:   Future    Standing Expiration Date:   11/17/2019    Order Specific Question:   ** REASON FOR EXAM (FREE TEXT)    Answer:   History of colon cancer    Order Specific Question:   Preferred imaging location?    Answer:   [facility]    Order Specific Question:   Is Oral Contrast requested for this exam?    Answer:   Yes, Per Radiology protocol    Order Specific Question:   Radiology Contrast Protocol - do NOT remove file path    Answer:   \\[email or web address]  . MM DIGITAL SCREENING BILATERAL    Standing Status:   Future    Standing Expiration Date:   11/18/2019    Order Specific Question:   Reason for Exam  (SYMPTOM  OR DIAGNOSIS REQUIRED)    Answer:   remote history of breast cancer    Order Specific Question:   Preferred imaging location?    Answer:   [facility]  . CBC with Differential/Platelet    Standing Status:   Future    Number of Occurrences:   1    Standing Expiration Date:   11/18/2019  . Comprehensive metabolic panel    Standing Status:   Future    Number of Occurrences:   1    Standing Expiration Date:   11/18/2019  . CEA    Standing Status:   Future    Number of Occurrences:   1    Standing Expiration Date:   11/18/2019  . CBC with Differential/Platelet    Standing Status:   Future    Standing Expiration Date:   11/18/2019  . Comprehensive metabolic panel    Standing Status:   Future    Standing Expiration Date:   11/18/2019  .  CEA    Standing Status:   Future    Standing Expiration Date:   11/18/2019  . CEA    Standing Status:   Future    Standing Expiration Date:   11/19/2019  Return in June 2020  Earlie Server, MD, PhD Hematology Oncology [facility] at [facility] Pager- [phone] 11/18/18

## 2018-11-19 ENCOUNTER — Ambulatory Visit: Payer: Medicare HMO | Admitting: Oncology

## 2018-12-03 ENCOUNTER — Other Ambulatory Visit: Payer: Self-pay | Admitting: Family Medicine

## 2018-12-03 DIAGNOSIS — M5136 Other intervertebral disc degeneration, lumbar region: Secondary | ICD-10-CM

## 2018-12-04 MED ORDER — HYDROCODONE-ACETAMINOPHEN 7.5-325 MG PO TABS: 1.0000 | ORAL_TABLET | Freq: Four times a day (QID) | ORAL | 0 refills | Status: DC | PRN

## 2018-12-07 ENCOUNTER — Ambulatory Visit
Admission: RE | Admit: 2018-12-07 | Discharge: 2018-12-07 | Disposition: A | Discharge status: Home / Self Care | Payer: Medicare HMO | Source: Ambulatory Visit | Attending: Oncology | Admitting: Oncology

## 2018-12-07 DIAGNOSIS — Z853 Personal history of malignant neoplasm of breast: Secondary | ICD-10-CM | POA: Insufficient documentation

## 2018-12-07 DIAGNOSIS — Z1231 Encounter for screening mammogram for malignant neoplasm of breast: Secondary | ICD-10-CM | POA: Insufficient documentation

## 2018-12-07 HISTORY — DX: Personal history of irradiation: Z92.3

## 2018-12-07 HISTORY — DX: Personal history of antineoplastic chemotherapy: Z92.21

## 2018-12-12 DIAGNOSIS — S72046A Nondisplaced fracture of base of neck of unspecified femur, initial encounter for closed fracture: Secondary | ICD-10-CM | POA: Diagnosis not present

## 2018-12-12 DIAGNOSIS — J441 Chronic obstructive pulmonary disease with (acute) exacerbation: Secondary | ICD-10-CM | POA: Diagnosis not present

## 2018-12-12 DIAGNOSIS — R062 Wheezing: Secondary | ICD-10-CM | POA: Diagnosis not present

## 2018-12-16 ENCOUNTER — Inpatient Hospital Stay: Payer: Medicare HMO | Attending: Oncology

## 2018-12-17 ENCOUNTER — Encounter: Payer: Self-pay | Admitting: *Deleted

## 2018-12-28 ENCOUNTER — Ambulatory Visit: Payer: Medicare HMO | Admitting: Radiation Oncology

## 2019-01-04 ENCOUNTER — Other Ambulatory Visit: Payer: Self-pay | Admitting: Family Medicine

## 2019-01-04 DIAGNOSIS — M5136 Other intervertebral disc degeneration, lumbar region: Secondary | ICD-10-CM

## 2019-01-05 MED ORDER — HYDROCODONE-ACETAMINOPHEN 7.5-325 MG PO TABS: 1.0000 | ORAL_TABLET | Freq: Four times a day (QID) | ORAL | 0 refills | Status: DC | PRN

## 2019-01-12 DIAGNOSIS — S72046A Nondisplaced fracture of base of neck of unspecified femur, initial encounter for closed fracture: Secondary | ICD-10-CM | POA: Diagnosis not present

## 2019-01-12 DIAGNOSIS — J441 Chronic obstructive pulmonary disease with (acute) exacerbation: Secondary | ICD-10-CM | POA: Diagnosis not present

## 2019-01-12 DIAGNOSIS — R062 Wheezing: Secondary | ICD-10-CM | POA: Diagnosis not present

## 2019-01-14 ENCOUNTER — Ambulatory Visit: Payer: Self-pay

## 2019-01-18 ENCOUNTER — Ambulatory Visit (INDEPENDENT_AMBULATORY_CARE_PROVIDER_SITE_OTHER): Payer: Medicare HMO

## 2019-01-18 ENCOUNTER — Other Ambulatory Visit: Payer: Self-pay

## 2019-01-18 DIAGNOSIS — Z0000 Encounter for general adult medical examination without abnormal findings: Secondary | ICD-10-CM | POA: Diagnosis not present

## 2019-01-18 NOTE — Progress Notes (Signed)
Subjective:   Kimberly [name] is a 73 y.o. female who presents for Medicare Annual (Subsequent) preventive examination.  This visit is being conducted via telephone due to the COVID-19 pandemic. This patient has given me verbal consent via virtual visit to conduct this visit. Some vital signs may be absent or patient reported.  Patient identification: identified by name, DOB, and current address.     Review of Systems:  N/A  Cardiac Risk Factors include: advanced age (>102men, >25 women);dyslipidemia     Objective:     Vitals: There were no vitals taken for this visit.  There is no height or weight on file to calculate BMI. Unable to obtain due to visit completed virtually.   Advanced Directives 01/18/2019 11/17/2018 09/01/2018 08/21/2018 07/30/2018 07/22/2018 06/25/2018  Does Patient Have a Medical Advance Directive? Yes No No Yes Yes Yes No  Type of Paramedic of [place];Living will - - - Living will;Healthcare Power of [place];Living will -  Does patient want to make changes to medical advance directive? - - - - No - Patient declined - -  Copy of [place] in Chart? Yes - validated most recent copy scanned in chart (See row information) - - - No - copy requested No - copy requested -  Would patient like information on creating a medical advance directive? - No - Patient declined No - Patient declined - No - Patient declined No - Patient declined No - Patient declined  Some encounter information is confidential and restricted. Go to Review Flowsheets activity to see all data.    Tobacco Social History   Tobacco Use  Smoking Status Former Smoker  . Packs/day: 0.25  . Years: 55.00  . Pack years: 13.75  . Types: Cigarettes  . Last attempt to quit: 05/21/2017  . Years since quitting: 1.6  Smokeless Tobacco Never Used  Tobacco Comment   started age 73 1/2 to 1 ppd     Counseling given: Not Answered Comment:  started age 45 1/2 to 1 ppd   Clinical Intake:  Pre-visit preparation completed: Yes  Pain : No/denies pain Pain Score: 0-No pain     Nutritional Status: BMI 25 -29 Overweight Nutritional Risks: None Diabetes: No  How often do you need to have someone help you when you read instructions, pamphlets, or other written materials from your doctor or pharmacy?: 1 - Never  Interpreter Needed?: No  Information entered by :: [facility], LPN  Past Medical History:  Diagnosis Date  . Breast cancer, left ([place]) 2004   Lumpectomy and rad tx's.  . Chronic back pain   . Colon cancer ([place]) 2018  . COPD (chronic obstructive pulmonary disease) ([place])   . Degenerative disc disease, lumbar 04/2013  . Dyspnea   . Family history of adverse reaction to anesthesia    son arrested after anesthesia  . GERD (gastroesophageal reflux disease)   . Hyperlipidemia   . Iron deficiency anemia 05/27/2017  . Neuropathy   . Osteoarthritis    of knee  . Osteoporosis   . Personal history of chemotherapy 2018   Colon  . Personal history of radiation therapy    Breast 2004  . Personal history of radiation therapy 2018   Colon  . Small bowel obstruction ([place]) 04/16/2017  . Tobacco abuse   . Vitamin D deficiency    Past Surgical History:  Procedure Laterality Date  . APPENDECTOMY  1965  . BREAST EXCISIONAL BIOPSY Left  08/01/2003   lumpectomy rad 11/04-2/28/2005  . BREAST SURGERY    . CESAREAN SECTION     x3  . COLON SURGERY    . COLONOSCOPY W/ POLYPECTOMY    . COLONOSCOPY WITH PROPOFOL N/A 04/09/2018   Procedure: COLONOSCOPY WITH PROPOFOL;  Surgeon: Virgel Manifold, MD;  Location: [facility];  Service: Gastroenterology;  Laterality: N/A;  . COLOSTOMY TAKEDOWN N/A 06/25/2018   Procedure: COLOSTOMY TAKEDOWN;  Surgeon: Jules Husbands, MD;  Location: [facility];  Service: General;  Laterality: N/A;  . ESOPHAGOGASTRODUODENOSCOPY (EGD) WITH PROPOFOL N/A 04/04/2017   Procedure: ESOPHAGOGASTRODUODENOSCOPY  (EGD) WITH PROPOFOL;  Surgeon: [name], MD;  Location: [facility];  Service: Endoscopy;  Laterality: N/A;  . FRACTURE SURGERY    . HIP FRACTURE SURGERY Left 01/25/2012  . JOINT REPLACEMENT    . LAPAROTOMY N/A 04/15/2017   Procedure: EXPLORATORY LAPAROTOMY;  Surgeon: Clayburn Pert, MD;  Location: [facility];  Service: General;  Laterality: N/A;  . NECK SURGERY  12/2011  . PARASTOMAL HERNIA REPAIR N/A 12/03/2017   Procedure: HERNIA REPAIR PARASTOMAL;  Surgeon: Jules Husbands, MD;  Location: [facility];  Service: General;  Laterality: N/A;  . PARASTOMAL HERNIA REPAIR N/A 06/25/2018   Procedure: HERNIA REPAIR PARASTOMAL;  Surgeon: Jules Husbands, MD;  Location: [facility];  Service: General;  Laterality: N/A;  . PORTACATH PLACEMENT Right 05/21/2017   Procedure: INSERTION PORT-A-CATH;  Surgeon: Clayburn Pert, MD;  Location: [facility];  Service: General;  Laterality: Right;  . WRIST FRACTURE SURGERY     Family History  Problem Relation Age of Onset  . Diabetes Mother        type 2  . Coronary artery disease Mother   . Deep vein thrombosis Mother   . Colon cancer Father   . Asthma Brother   . Diabetes Brother        type 2  . Breast cancer Neg Hx    Social History   Socioeconomic History  . Marital status: Divorced    Spouse name: Not on file  . Number of children: 3  . Years of education: Not on file  . Highest education level: 7th grade  Occupational History  . Occupation: retired  Scientific laboratory technician  . Financial resource strain: Somewhat hard  . Food insecurity:    Worry: Never true    Inability: Never true  . Transportation needs:    Medical: No    Non-medical: No  Tobacco Use  . Smoking status: Former Smoker    Packs/day: 0.25    Years: 55.00    Pack years: 13.75    Types: Cigarettes    Last attempt to quit: 05/21/2017    Years since quitting: 1.6  . Smokeless tobacco: Never Used  . Tobacco comment: started age 58 1/2 to 1 ppd  Substance and Sexual Activity  .  Alcohol use: Yes    Alcohol/week: 0.0 standard drinks    Comment: occasionally drinks beer  . Drug use: No  . Sexual activity: Never  Lifestyle  . Physical activity:    Days per week: 0 days    Minutes per session: 0 min  . Stress: Not at all  Relationships  . Social connections:    Talks on phone: Patient refused    Gets together: Patient refused    Attends religious service: Patient refused    Active member of club or organization: Patient refused    Attends meetings of clubs or organizations: Patient refused    Relationship  status: Patient refused  Other Topics Concern  . Not on file  Social History Narrative  . Not on file    Outpatient Encounter Medications as of 01/18/2019  Medication Sig  . budesonide-formoterol (SYMBICORT) 80-4.5 MCG/ACT inhaler Inhale 2 puffs into the lungs 2 (two) times daily.  . cholecalciferol (VITAMIN D) 1000 units tablet Take 1,000 Units by mouth daily.  . feeding supplement (BOOST / RESOURCE BREEZE) LIQD Take 1 Container by mouth 3 (three) times daily between meals.  . gabapentin (NEURONTIN) 400 MG capsule Take 1 capsule (400 mg total) by mouth 3 (three) times daily.  Marland Kitchen HYDROcodone-acetaminophen (NORCO) 7.5-325 MG tablet Take 1-2 tablets by mouth every 6 (six) hours as needed for moderate pain.  Marland Kitchen ibuprofen (ADVIL,MOTRIN) 200 MG tablet Take 400 mg by mouth daily as needed for headache or moderate pain.  . meloxicam (MOBIC) 15 MG tablet TAKE 1 TABLET EVERY DAY AS NEEDED  . montelukast (SINGULAIR) 10 MG tablet Take 1 tablet (10 mg total) by mouth daily.  Marland Kitchen omeprazole (PRILOSEC) 20 MG capsule TAKE 1 CAPSULE EVERY DAY (Patient taking differently: Take 20 mg by mouth daily. )  . pravastatin (PRAVACHOL) 40 MG tablet TAKE 1 TABLET EVERY DAY (Patient taking differently: Take 40 mg by mouth daily. )  . Probiotic Product (PROBIOTIC DAILY PO) Take 1 capsule by mouth daily.   Marland Kitchen pyridOXINE (VITAMIN B-6) 50 MG tablet Take 1 tablet (50 mg total) by mouth daily.   . Triamcinolone Acetonide (NASACORT ALLERGY 24HR NA) Place 2 sprays into the nose daily.   . vitamin B-12 (CYANOCOBALAMIN) 1000 MCG tablet Take 1,000 mcg by mouth daily.  . ondansetron (ZOFRAN) 4 MG tablet Take 0.5-1 tablets (2-4 mg total) by mouth every 8 (eight) hours as needed for nausea or vomiting. (Patient not taking: Reported on 01/18/2019)  . vancomycin (VANCOCIN) 125 MG capsule Take 1 capsule (125 mg total) by mouth 4 (four) times daily. (Patient not taking: Reported on 11/17/2018)   Facility-Administered Encounter Medications as of 01/18/2019  Medication  . 0.9 %  sodium chloride infusion  . heparin lock flush 100 unit/mL  . heparin lock flush 100 unit/mL  . heparin lock flush 100 unit/mL  . sodium chloride flush (NS) 0.9 % injection 10 mL    Activities of Daily Living In your present state of health, do you have any difficulty performing the following activities: 01/18/2019 09/01/2018  Hearing? N N  Vision? N N  Difficulty concentrating or making decisions? N N  Walking or climbing stairs? Y N  Comment Right hip pain when climbing stairs. -  Dressing or bathing? N N  Doing errands, shopping? N N  Preparing Food and eating ? N -  Using the Toilet? N -  In the past six months, have you accidently leaked urine? N -  Do you have problems with loss of bowel control? N -  Managing your Medications? N -  Managing your Finances? N -  Housekeeping or managing your Housekeeping? N -  Some recent data might be hidden    Patient Care Team: Birdie Sons, MD as PCP - General (Family Medicine) Earlie Server, MD as Consulting Physician (Oncology) Noreene Filbert, MD as Referring Physician (Radiation Oncology) Jules Husbands, MD as Consulting Physician (General Surgery)    Assessment:   This is a routine wellness examination for Denisse.  Exercise Activities and Dietary recommendations Current Exercise Habits: The patient does not participate in regular exercise at present, Exercise  limited by: None identified  Goals    . Decrease sugar intake     Recommend cutting down on sugar and sweets to eating 1 small portion a day versus 3 a day.    . Have 3 meals a day     Recommend eating 3 small meals a day with 2 healthy snack in between.     . Prevent falls     Recommend to remove any items from the home that may cause slips or trips.       Fall Risk: Fall Risk  01/18/2019 10/07/2018 10/01/2018 09/25/2018 09/16/2018  Falls in the past year? 1 0 0 0 0  Number falls in past yr: 0 - - - -  Comment tripped over dog  - - - -  Injury with Fall? 0 - - - -  Follow up Falls prevention discussed - Falls evaluation completed - -    FALL RISK PREVENTION PERTAINING TO THE HOME:  Any stairs in or around the home? Yes  If so, are there any without handrails? Yes   Home free of loose throw rugs in walkways, pet beds, electrical cords, etc? Yes  Adequate lighting in your home to reduce risk of falls? Yes   ASSISTIVE DEVICES UTILIZED TO PREVENT FALLS:  Life alert? No  Use of a cane, walker or w/c? Yes  Grab bars in the bathroom? Yes  Shower chair or bench in shower? Yes  Elevated toilet seat or a handicapped toilet? No    TIMED UP AND GO:  Was the test performed? No .    Depression Screen PHQ 2/9 Scores 01/18/2019 03/31/2018 01/12/2018 01/10/2017  PHQ - 2 Score 0 0 0 0  PHQ- 9 Score - - - 0     Cognitive Function: Declined today.      6CIT Screen 01/10/2017  What Year? 0 points  What month? 0 points  What time? 0 points  Count back from 20 0 points  Months in reverse 2 points  Repeat phrase 0 points  Total Score 2    Immunization History  Administered Date(s) Administered  . Influenza, High Dose Seasonal PF 07/12/2016, 12/05/2017, 06/09/2018  . Influenza-Unspecified 08/30/2013  . Pneumococcal Conjugate-13 06/30/2014  . Pneumococcal Polysaccharide-23 03/02/2013, 04/17/2017  . Tdap 12/02/2014  . Zoster 12/02/2014    Qualifies for Shingles Vaccine? Yes   Zostavax completed 12/02/14. Due for Shingrix. Education has been provided regarding the importance of this vaccine. Pt has been advised to call insurance company to determine out of pocket expense. Advised may also receive vaccine at local pharmacy or Health Dept. Verbalized acceptance and understanding.  Tdap: Up to date  Flu Vaccine: Up to date  Pneumococcal Vaccine: Up to date  Screening Tests Health Maintenance  Topic Date Due  . COLONOSCOPY  04/10/2019  . INFLUENZA VACCINE  05/01/2019  . DEXA SCAN  05/08/2019  . MAMMOGRAM  12/06/2020  . TETANUS/TDAP  12/01/2024  . Hepatitis C Screening  Completed  . PNA vac Low Risk Adult  Completed    Cancer Screenings:  Colorectal Screening: Completed 04/09/18. Repeat as advised by GI.  Mammogram: Completed 12/07/18.   Bone Density: Completed 05/07/16. Results reflect OSTEOPENIA. Repeat every 3 years.   Lung Cancer Screening: (Low Dose CT Chest recommended if Age 69-80 years, 30 pack-year currently smoking OR have quit w/in 15years.) does qualify. An Epic message has been sent to Burgess Estelle, RN (Oncology Nurse Navigator) regarding the possible need for this exam. Raquel Sarna will review the patient's chart to determine if the  patient truly qualifies for the exam. If the patient qualifies, Raquel Sarna will order the Low Dose CT of the chest to facilitate the scheduling of this exam.  Additional Screening:  Hepatitis C Screening: Up to date  Vision Screening: Recommended annual ophthalmology exams for early detection of glaucoma and other disorders of the eye.  Dental Screening: Recommended annual dental exams for proper oral hygiene  Community Resource Referral:  CRR required this visit?  No       Plan:  I have personally reviewed and addressed the Medicare Annual Wellness questionnaire and have noted the following in the patient's chart:  A. Medical and social history B. Use of alcohol, tobacco or illicit drugs  C. Current medications and  supplements D. Functional ability and status E.  Nutritional status F.  Physical activity G. Advance directives H. List of other physicians I.  Hospitalizations, surgeries, and ER visits in previous 12 months J.  [place] such as hearing and vision if needed, cognitive and depression L. Referrals and appointments   In addition, I have reviewed and discussed with patient certain preventive protocols, quality metrics, and best practice recommendations. A written personalized care plan for preventive services as well as general preventive health recommendations were provided to patient. Nurse Health Advisor  Signed,    Cordella Nyquist [place], [place]  5/70/1779 Nurse Health Advisor   Nurse Notes: None.

## 2019-01-18 NOTE — Patient Instructions (Addendum)
Kimberly [name] , Thank you for taking time to come for your Medicare Wellness Visit. I appreciate your ongoing commitment to your health goals. Please review the following plan we discussed and let me know if I can assist you in the future.   Screening recommendations/referrals: Colonoscopy: Up to date, f/u with GI for next colonoscopy. Mammogram: Up to date, due 11/2020 Bone Density: Up to date, due 05/2019 Recommended yearly ophthalmology/optometry visit for glaucoma screening and checkup Recommended yearly dental visit for hygiene and checkup  Vaccinations: Influenza vaccine: Up to date Pneumococcal vaccine: Completed series Tdap vaccine: Up to date, due 11/2024 Shingles vaccine: Pt declines today.     Advanced directives: Currently on file.  Conditions/risks identified: Fall risk prevention discussed today.   Next appointment: 04/09/19 @ 2:00 PM with Dr Caryn Section.    Preventive Care 29 Years and Older, Female Preventive care refers to lifestyle choices and visits with your health care provider that can promote health and wellness. What does preventive care include?  A yearly physical exam. This is also called an annual well check.  Dental exams once or twice a year.  Routine eye exams. Ask your health care provider how often you should have your eyes checked.  Personal lifestyle choices, including:  Daily care of your teeth and gums.  Regular physical activity.  Eating a healthy diet.  Avoiding tobacco and drug use.  Limiting alcohol use.  Practicing safe sex.  Taking low-dose aspirin every day.  Taking vitamin and mineral supplements as recommended by your health care provider. What happens during an annual well check? The services and screenings done by your health care provider during your annual well check will depend on your age, overall health, lifestyle risk factors, and family history of disease. Counseling  Your health care provider may ask you questions about  your:  Alcohol use.  Tobacco use.  Drug use.  Emotional well-being.  Home and relationship well-being.  Sexual activity.  Eating habits.  History of falls.  Memory and ability to understand (cognition).  Work and work Statistician.  Reproductive health. Screening  You may have the following tests or measurements:  Height, weight, and BMI.  Blood pressure.  Lipid and cholesterol levels. These may be checked every 5 years, or more frequently if you are over 86 years old.  Skin check.  Lung cancer screening. You may have this screening every year starting at age 62 if you have a 30-pack-year history of smoking and currently smoke or have quit within the past 15 years.  Fecal occult blood test (FOBT) of the stool. You may have this test every year starting at age 1.  Flexible sigmoidoscopy or colonoscopy. You may have a sigmoidoscopy every 5 years or a colonoscopy every 10 years starting at age 66.  Hepatitis C blood test.  Hepatitis B blood test.  Sexually transmitted disease (STD) testing.  Diabetes screening. This is done by checking your blood sugar (glucose) after you have not eaten for a while (fasting). You may have this done every 1-3 years.  Bone density scan. This is done to screen for osteoporosis. You may have this done starting at age 38.  Mammogram. This may be done every 1-2 years. Talk to your health care provider about how often you should have regular mammograms. Talk with your health care provider about your test results, treatment options, and if necessary, the need for more tests. Vaccines  Your health care provider may recommend certain vaccines, such as:  Influenza vaccine.  This is recommended every year.  Tetanus, diphtheria, and acellular pertussis (Tdap, Td) vaccine. You may need a Td booster every 10 years.  Zoster vaccine. You may need this after age 68.  Pneumococcal 13-valent conjugate (PCV13) vaccine. One dose is recommended  after age 76.  Pneumococcal polysaccharide (PPSV23) vaccine. One dose is recommended after age 20. Talk to your health care provider about which screenings and vaccines you need and how often you need them. This information is not intended to replace advice given to you by your health care provider. Make sure you discuss any questions you have with your health care provider. Document Released: 10/13/2015 Document Revised: 06/05/2016 Document Reviewed: 07/18/2015 Elsevier Interactive Patient Education  2017 [place] Prevention in the Home Falls can cause injuries. They can happen to people of all ages. There are many things you can do to make your home safe and to help prevent falls. What can I do on the outside of my home?  Regularly fix the edges of walkways and driveways and fix any cracks.  Remove anything that might make you trip as you walk through a door, such as a raised step or threshold.  Trim any bushes or trees on the path to your home.  Use bright outdoor lighting.  Clear any walking paths of anything that might make someone trip, such as rocks or tools.  Regularly check to see if handrails are loose or broken. Make sure that both sides of any steps have handrails.  Any raised decks and porches should have guardrails on the edges.  Have any leaves, snow, or ice cleared regularly.  Use sand or salt on walking paths during winter.  Clean up any spills in your garage right away. This includes oil or grease spills. What can I do in the bathroom?  Use night lights.  Install grab bars by the toilet and in the tub and shower. Do not use towel bars as grab bars.  Use non-skid mats or decals in the tub or shower.  If you need to sit down in the shower, use a plastic, non-slip stool.  Keep the floor dry. Clean up any water that spills on the floor as soon as it happens.  Remove soap buildup in the tub or shower regularly.  Attach bath mats securely with  double-sided non-slip rug tape.  Do not have throw rugs and other things on the floor that can make you trip. What can I do in the bedroom?  Use night lights.  Make sure that you have a light by your bed that is easy to reach.  Do not use any sheets or blankets that are too big for your bed. They should not hang down onto the floor.  Have a firm chair that has side arms. You can use this for support while you get dressed.  Do not have throw rugs and other things on the floor that can make you trip. What can I do in the kitchen?  Clean up any spills right away.  Avoid walking on wet floors.  Keep items that you use a lot in easy-to-reach places.  If you need to reach something above you, use a strong step stool that has a grab bar.  Keep electrical cords out of the way.  Do not use floor polish or wax that makes floors slippery. If you must use wax, use non-skid floor wax.  Do not have throw rugs and other things on the floor that can make  you trip. What can I do with my stairs?  Do not leave any items on the stairs.  Make sure that there are handrails on both sides of the stairs and use them. Fix handrails that are broken or loose. Make sure that handrails are as long as the stairways.  Check any carpeting to make sure that it is firmly attached to the stairs. Fix any carpet that is loose or worn.  Avoid having throw rugs at the top or bottom of the stairs. If you do have throw rugs, attach them to the floor with carpet tape.  Make sure that you have a light switch at the top of the stairs and the bottom of the stairs. If you do not have them, ask someone to add them for you. What else can I do to help prevent falls?  Wear shoes that:  Do not have high heels.  Have rubber bottoms.  Are comfortable and fit you well.  Are closed at the toe. Do not wear sandals.  If you use a stepladder:  Make sure that it is fully opened. Do not climb a closed stepladder.  Make  sure that both sides of the stepladder are locked into place.  Ask someone to hold it for you, if possible.  Clearly mark and make sure that you can see:  Any grab bars or handrails.  First and last steps.  Where the edge of each step is.  Use tools that help you move around (mobility aids) if they are needed. These include:  Canes.  Walkers.  Scooters.  Crutches.  Turn on the lights when you go into a dark area. Replace any light bulbs as soon as they burn out.  Set up your furniture so you have a clear path. Avoid moving your furniture around.  If any of your floors are uneven, fix them.  If there are any pets around you, be aware of where they are.  Review your medicines with your doctor. Some medicines can make you feel dizzy. This can increase your chance of falling. Ask your doctor what other things that you can do to help prevent falls. This information is not intended to replace advice given to you by your health care provider. Make sure you discuss any questions you have with your health care provider. Document Released: 07/13/2009 Document Revised: 02/22/2016 Document Reviewed: 10/21/2014 Elsevier Interactive Patient Education  2017 [employer].

## 2019-02-05 ENCOUNTER — Other Ambulatory Visit: Payer: Self-pay | Admitting: Family Medicine

## 2019-02-05 DIAGNOSIS — M5136 Other intervertebral disc degeneration, lumbar region: Secondary | ICD-10-CM

## 2019-02-08 NOTE — Telephone Encounter (Signed)
Please review for Dr. Fisher.   Thanks,   -Burnice Vassel  

## 2019-02-09 MED ORDER — HYDROCODONE-ACETAMINOPHEN 7.5-325 MG PO TABS: 1.0000 | ORAL_TABLET | Freq: Four times a day (QID) | ORAL | 0 refills | Status: DC | PRN

## 2019-02-11 DIAGNOSIS — J441 Chronic obstructive pulmonary disease with (acute) exacerbation: Secondary | ICD-10-CM | POA: Diagnosis not present

## 2019-02-11 DIAGNOSIS — S72046A Nondisplaced fracture of base of neck of unspecified femur, initial encounter for closed fracture: Secondary | ICD-10-CM | POA: Diagnosis not present

## 2019-02-11 DIAGNOSIS — R062 Wheezing: Secondary | ICD-10-CM | POA: Diagnosis not present

## 2019-02-23 ENCOUNTER — Other Ambulatory Visit: Payer: Self-pay

## 2019-02-24 ENCOUNTER — Other Ambulatory Visit: Payer: Self-pay

## 2019-02-24 ENCOUNTER — Encounter: Payer: Self-pay | Admitting: Radiation Oncology

## 2019-02-24 ENCOUNTER — Ambulatory Visit
Admission: RE | Admit: 2019-02-24 | Discharge: 2019-02-24 | Disposition: A | Discharge status: Home / Self Care | Payer: Medicare HMO | Source: Ambulatory Visit | Attending: Radiation Oncology | Admitting: Radiation Oncology

## 2019-02-24 VITALS — BP 182/100 | HR 97 | Temp 98.6°F | Resp 16 | Wt 161.9 lb

## 2019-02-24 DIAGNOSIS — R109 Unspecified abdominal pain: Secondary | ICD-10-CM | POA: Insufficient documentation

## 2019-02-24 DIAGNOSIS — R970 Elevated carcinoembryonic antigen [CEA]: Secondary | ICD-10-CM | POA: Insufficient documentation

## 2019-02-24 DIAGNOSIS — Z923 Personal history of irradiation: Secondary | ICD-10-CM | POA: Insufficient documentation

## 2019-02-24 DIAGNOSIS — Z8719 Personal history of other diseases of the digestive system: Secondary | ICD-10-CM | POA: Diagnosis not present

## 2019-02-24 DIAGNOSIS — C184 Malignant neoplasm of transverse colon: Secondary | ICD-10-CM

## 2019-02-24 DIAGNOSIS — C779 Secondary and unspecified malignant neoplasm of lymph node, unspecified: Secondary | ICD-10-CM | POA: Diagnosis not present

## 2019-02-24 DIAGNOSIS — Z87891 Personal history of nicotine dependence: Secondary | ICD-10-CM | POA: Diagnosis not present

## 2019-02-24 NOTE — Progress Notes (Signed)
Radiation Oncology Follow up Note  Name: Kimberly [name]   Date:   02/24/2019 MRN:  [id number] DOB: [birth year]    This 73 y.o. 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  REFERRING PROVIDER: Birdie Sons, MD  HPI: Patient is a 73 year old female now about 9 months having completed adjuvant radiation therapy after chemotherapy for a T4N1 moderately differentiated adenocarcinoma the transverse colon status post resection.  She had a colostomy reversed.  She is seen today in routine follow-up is doing well.  She does occasionally have some abdominal discomfort she does have a history and radiologic evidence of diverticulosis.  She specifically denies bloody stools..  COMPLICATIONS OF TREATMENT: none  FOLLOW UP COMPLIANCE: keeps appointments   PHYSICAL EXAM:  BP (!) 182/100 (BP Location: Left Arm, Patient Position: Sitting)   Pulse 97   Temp 98.6 F (37 C) (Tympanic)   Resp 16   Wt 161 lb 14.9 oz (73.5 kg)   BMI 26.95 kg/m  Well-developed well-nourished patient in NAD. HEENT reveals PERLA, EOMI, discs not visualized.  Oral cavity is clear. No oral mucosal lesions are identified. Neck is clear without evidence of cervical or supraclavicular adenopathy. Lungs are clear to A&P. Cardiac examination is essentially unremarkable with regular rate and rhythm without murmur rub or thrill. Abdomen is benign with no organomegaly or masses noted. Motor sensory and DTR levels are equal and symmetric in the upper and lower extremities. Cranial nerves II through XII are grossly intact. Proprioception is intact. No peripheral adenopathy or edema is identified. No motor or sensory levels are noted. Crude visual fields are within normal range.  RADIOLOGY RESULTS: CT scan scheduled for June 3 which I will review  PLAN: Present time patient is doing well I will review  her CT scan on June 3.  She continues close follow-up care with medical oncology.  Her CEA is being closely watched it has increased recently possibly due to inflammation.  I have asked to see her back in 6 months for follow-up.  Patient is to call with any concerns.  I would like to take this opportunity to thank you for allowing me to participate in the care of your patient.Noreene Filbert, MD

## 2019-03-03 ENCOUNTER — Inpatient Hospital Stay: Payer: Medicare HMO | Attending: Oncology

## 2019-03-03 ENCOUNTER — Other Ambulatory Visit: Payer: Self-pay

## 2019-03-03 ENCOUNTER — Ambulatory Visit
Admission: RE | Admit: 2019-03-03 | Discharge: 2019-03-03 | Disposition: A | Discharge status: Home / Self Care | Payer: Medicare HMO | Source: Ambulatory Visit | Attending: Oncology | Admitting: Oncology

## 2019-03-03 DIAGNOSIS — Z923 Personal history of irradiation: Secondary | ICD-10-CM | POA: Diagnosis not present

## 2019-03-03 DIAGNOSIS — E279 Disorder of adrenal gland, unspecified: Secondary | ICD-10-CM | POA: Diagnosis not present

## 2019-03-03 DIAGNOSIS — Z853 Personal history of malignant neoplasm of breast: Secondary | ICD-10-CM | POA: Insufficient documentation

## 2019-03-03 DIAGNOSIS — Z9049 Acquired absence of other specified parts of digestive tract: Secondary | ICD-10-CM | POA: Insufficient documentation

## 2019-03-03 DIAGNOSIS — K5730 Diverticulosis of large intestine without perforation or abscess without bleeding: Secondary | ICD-10-CM | POA: Diagnosis not present

## 2019-03-03 DIAGNOSIS — D72821 Monocytosis (symptomatic): Secondary | ICD-10-CM | POA: Insufficient documentation

## 2019-03-03 DIAGNOSIS — Z85038 Personal history of other malignant neoplasm of large intestine: Secondary | ICD-10-CM | POA: Diagnosis not present

## 2019-03-03 DIAGNOSIS — J449 Chronic obstructive pulmonary disease, unspecified: Secondary | ICD-10-CM | POA: Insufficient documentation

## 2019-03-03 DIAGNOSIS — E785 Hyperlipidemia, unspecified: Secondary | ICD-10-CM | POA: Insufficient documentation

## 2019-03-03 DIAGNOSIS — G629 Polyneuropathy, unspecified: Secondary | ICD-10-CM | POA: Insufficient documentation

## 2019-03-03 DIAGNOSIS — E559 Vitamin D deficiency, unspecified: Secondary | ICD-10-CM | POA: Insufficient documentation

## 2019-03-03 DIAGNOSIS — J438 Other emphysema: Secondary | ICD-10-CM | POA: Diagnosis not present

## 2019-03-03 DIAGNOSIS — Z79899 Other long term (current) drug therapy: Secondary | ICD-10-CM | POA: Insufficient documentation

## 2019-03-03 DIAGNOSIS — Z791 Long term (current) use of non-steroidal anti-inflammatories (NSAID): Secondary | ICD-10-CM | POA: Insufficient documentation

## 2019-03-03 DIAGNOSIS — C184 Malignant neoplasm of transverse colon: Secondary | ICD-10-CM | POA: Insufficient documentation

## 2019-03-03 DIAGNOSIS — Z87891 Personal history of nicotine dependence: Secondary | ICD-10-CM | POA: Insufficient documentation

## 2019-03-03 DIAGNOSIS — Z7951 Long term (current) use of inhaled steroids: Secondary | ICD-10-CM | POA: Insufficient documentation

## 2019-03-04 ENCOUNTER — Inpatient Hospital Stay (HOSPITAL_BASED_OUTPATIENT_CLINIC_OR_DEPARTMENT_OTHER): Payer: Medicare HMO | Admitting: Oncology

## 2019-03-04 ENCOUNTER — Encounter: Payer: Self-pay | Admitting: Oncology

## 2019-03-04 DIAGNOSIS — Z87891 Personal history of nicotine dependence: Secondary | ICD-10-CM | POA: Diagnosis not present

## 2019-03-04 DIAGNOSIS — Z79899 Other long term (current) drug therapy: Secondary | ICD-10-CM | POA: Diagnosis not present

## 2019-03-04 DIAGNOSIS — C184 Malignant neoplasm of transverse colon: Secondary | ICD-10-CM | POA: Diagnosis not present

## 2019-03-04 DIAGNOSIS — D72821 Monocytosis (symptomatic): Secondary | ICD-10-CM

## 2019-03-04 NOTE — Progress Notes (Signed)
Patient contacted for telehealth visit. No concerns voiced.  

## 2019-03-04 NOTE — Progress Notes (Signed)
HEMATOLOGY-ONCOLOGY TeleHEALTH VISIT PROGRESS NOTE  I connected with Kimberly [name] on 03/04/19 at  9:15 AM EDT by video enabled telemedicine visit and verified that I am speaking with the correct person using two identifiers. I discussed the limitations, risks, security and privacy concerns of performing an evaluation and management service by telemedicine and the availability of in-person appointments. I also discussed with the patient that there may be a patient responsible charge related to this service. The patient expressed understanding and agreed to proceed.   I attempted to connect the patient for visual enabled telehealth visit via [employer].  Due to the technical difficulties with video,  Patient was transitioned to audio only visit.  Other persons participating in the visit and their role in the encounter:  Geraldine Solar, [place], check in patient   Janeann Merl, RN, check in patient.   Patient's location: Home  Provider's location: Home office.  Chief Complaint: Follow-up for colon cancer.   INTERVAL HISTORY Kimberly [name] is a 73 y.o. female who has above history reviewed by me today presents for follow up visit for management of colon cancer. Problems and complaints are listed below:  Patient reports doing well today.  Denies any pain.  Bowel movement has been regular.  No blood in the stool. She was here yesterday for port flush and labs.  Was found to have a temperature of 100.7. She could not get labs done due to fever.  She went home and remeasured her blood pressure which was below 100. Feels well at baseline.  Denies any nausea, vomiting, abdominal pain.   Review of Systems  Constitutional: Negative for appetite change, chills, fatigue and fever.  HENT:   Negative for hearing loss and voice change.   Eyes: Negative for eye problems.  Respiratory: Negative for chest tightness and cough.   Cardiovascular: Negative for chest pain.  Gastrointestinal: Negative for  abdominal distention, abdominal pain and blood in stool.  Endocrine: Negative for hot flashes.  Genitourinary: Negative for difficulty urinating and frequency.   Musculoskeletal: Negative for arthralgias.  Skin: Negative for itching and rash.  Neurological: Negative for extremity weakness.  Hematological: Negative for adenopathy.  Psychiatric/Behavioral: Negative for confusion.    Past Medical History:  Diagnosis Date  . Breast cancer, left ([place]) 2004   Lumpectomy and rad tx's.  . Chronic back pain   . Colon cancer ([place]) 2018  . COPD (chronic obstructive pulmonary disease) ([place])   . Degenerative disc disease, lumbar 04/2013  . Dyspnea   . Family history of adverse reaction to anesthesia    son arrested after anesthesia  . GERD (gastroesophageal reflux disease)   . Hyperlipidemia   . Iron deficiency anemia 05/27/2017  . Neuropathy   . Osteoarthritis    of knee  . Osteoporosis   . Personal history of chemotherapy 2018   Colon  . Personal history of radiation therapy    Breast 2004  . Personal history of radiation therapy 2018   Colon  . Small bowel obstruction ([place]) 04/16/2017  . Tobacco abuse   . Vitamin D deficiency    Past Surgical History:  Procedure Laterality Date  . APPENDECTOMY  1965  . BREAST EXCISIONAL BIOPSY Left 08/01/2003   lumpectomy rad 11/04-2/28/2005  . BREAST SURGERY    . CESAREAN SECTION     x3  . COLON SURGERY    . COLONOSCOPY W/ POLYPECTOMY    . COLONOSCOPY WITH PROPOFOL N/A 04/09/2018   Procedure: COLONOSCOPY WITH PROPOFOL;  Surgeon: Bonna Gains,  Lennette Bihari, MD;  Location: [facility];  Service: Gastroenterology;  Laterality: N/A;  . COLOSTOMY TAKEDOWN N/A 06/25/2018   Procedure: COLOSTOMY TAKEDOWN;  Surgeon: Jules Husbands, MD;  Location: [facility];  Service: General;  Laterality: N/A;  . ESOPHAGOGASTRODUODENOSCOPY (EGD) WITH PROPOFOL N/A 04/04/2017   Procedure: ESOPHAGOGASTRODUODENOSCOPY (EGD) WITH PROPOFOL;  Surgeon: [name], MD;  Location:  [facility];  Service: Endoscopy;  Laterality: N/A;  . FRACTURE SURGERY    . HIP FRACTURE SURGERY Left 01/25/2012  . JOINT REPLACEMENT    . LAPAROTOMY N/A 04/15/2017   Procedure: EXPLORATORY LAPAROTOMY;  Surgeon: Clayburn Pert, MD;  Location: [facility];  Service: General;  Laterality: N/A;  . NECK SURGERY  12/2011  . PARASTOMAL HERNIA REPAIR N/A 12/03/2017   Procedure: HERNIA REPAIR PARASTOMAL;  Surgeon: Jules Husbands, MD;  Location: [facility];  Service: General;  Laterality: N/A;  . PARASTOMAL HERNIA REPAIR N/A 06/25/2018   Procedure: HERNIA REPAIR PARASTOMAL;  Surgeon: Jules Husbands, MD;  Location: [facility];  Service: General;  Laterality: N/A;  . PORTACATH PLACEMENT Right 05/21/2017   Procedure: INSERTION PORT-A-CATH;  Surgeon: Clayburn Pert, MD;  Location: [facility];  Service: General;  Laterality: Right;  . WRIST FRACTURE SURGERY      Family History  Problem Relation Age of Onset  . Diabetes Mother        type 2  . Coronary artery disease Mother   . Deep vein thrombosis Mother   . Colon cancer Father   . Asthma Brother   . Diabetes Brother        type 2  . Breast cancer Neg Hx     Social History   Socioeconomic History  . Marital status: Divorced    Spouse name: Not on file  . Number of children: 3  . Years of education: Not on file  . Highest education level: 7th grade  Occupational History  . Occupation: retired  Scientific laboratory technician  . Financial resource strain: Somewhat hard  . Food insecurity:    Worry: Never true    Inability: Never true  . Transportation needs:    Medical: No    Non-medical: No  Tobacco Use  . Smoking status: Former Smoker    Packs/day: 0.25    Years: 55.00    Pack years: 13.75    Types: Cigarettes    Last attempt to quit: 05/21/2017    Years since quitting: 1.7  . Smokeless tobacco: Never Used  . Tobacco comment: started age 68 1/2 to 1 ppd  Substance and Sexual Activity  . Alcohol use: Yes    Alcohol/week: 0.0 standard drinks     Comment: occasionally drinks beer  . Drug use: No  . Sexual activity: Never  Lifestyle  . Physical activity:    Days per week: 0 days    Minutes per session: 0 min  . Stress: Not at all  Relationships  . Social connections:    Talks on phone: Patient refused    Gets together: Patient refused    Attends religious service: Patient refused    Active member of club or organization: Patient refused    Attends meetings of clubs or organizations: Patient refused    Relationship status: Patient refused  . Intimate partner violence:    Fear of current or ex partner: Patient refused    Emotionally abused: Patient refused    Physically abused: Patient refused    Forced sexual activity: Patient refused  Other Topics Concern  . Not  on file  Social History Narrative  . Not on file    Current Outpatient Medications on File Prior to Visit  Medication Sig Dispense Refill  . budesonide-formoterol (SYMBICORT) 80-4.5 MCG/ACT inhaler Inhale 2 puffs into the lungs 2 (two) times daily. 3 Inhaler 3  . cholecalciferol (VITAMIN D) 1000 units tablet Take 1,000 Units by mouth daily.    . feeding supplement (BOOST / RESOURCE BREEZE) LIQD Take 1 Container by mouth 3 (three) times daily between meals. 30 Container 0  . gabapentin (NEURONTIN) 400 MG capsule Take 1 capsule (400 mg total) by mouth 3 (three) times daily. 270 capsule 1  . HYDROcodone-acetaminophen (NORCO) 7.5-325 MG tablet Take 1-2 tablets by mouth every 6 (six) hours as needed for moderate pain. 120 tablet 0  . ibuprofen (ADVIL,MOTRIN) 200 MG tablet Take 400 mg by mouth daily as needed for headache or moderate pain.    . meloxicam (MOBIC) 15 MG tablet TAKE 1 TABLET EVERY DAY AS NEEDED 90 tablet 3  . montelukast (SINGULAIR) 10 MG tablet Take 1 tablet (10 mg total) by mouth daily. 90 tablet 1  . omeprazole (PRILOSEC) 20 MG capsule TAKE 1 CAPSULE EVERY DAY (Patient taking differently: Take 20 mg by mouth daily. ) 90 capsule 4  . Probiotic Product  (PROBIOTIC DAILY PO) Take 1 capsule by mouth daily.     Marland Kitchen pyridOXINE (VITAMIN B-6) 50 MG tablet Take 1 tablet (50 mg total) by mouth daily. 30 tablet 3  . vitamin B-12 (CYANOCOBALAMIN) 1000 MCG tablet Take 1,000 mcg by mouth daily.    . ondansetron (ZOFRAN) 4 MG tablet Take 0.5-1 tablets (2-4 mg total) by mouth every 8 (eight) hours as needed for nausea or vomiting. (Patient not taking: Reported on 03/04/2019) 20 tablet 0  . pravastatin (PRAVACHOL) 40 MG tablet TAKE 1 TABLET EVERY DAY (Patient not taking: No sig reported) 90 tablet 3  . Triamcinolone Acetonide (NASACORT ALLERGY 24HR NA) Place 2 sprays into the nose daily.     . vancomycin (VANCOCIN) 125 MG capsule Take 1 capsule (125 mg total) by mouth 4 (four) times daily. (Patient not taking: Reported on 11/17/2018) 56 capsule 1   Current Facility-Administered Medications on File Prior to Visit  Medication Dose Route Frequency Provider Last Rate Last Dose  . 0.9 %  sodium chloride infusion   Intravenous Continuous Earlie Server, MD   Stopped at 07/22/17 1150  . heparin lock flush 100 unit/mL  500 Units Intravenous Once Earlie Server, MD      . heparin lock flush 100 unit/mL  500 Units Intravenous Once Earlie Server, MD      . heparin lock flush 100 unit/mL  500 Units Intravenous Once Earlie Server, MD      . sodium chloride flush (NS) 0.9 % injection 10 mL  10 mL Intravenous PRN Earlie Server, MD   10 mL at 05/29/17 1347    Allergies  Allergen Reactions  . Betadine [Povidone Iodine] Other (See Comments)    Topical iodine she states "if you put it in an open sore it will cause a eating ulcer."  . Other Nausea And Vomiting    Antihystamines  . Sinus Formula [Cholestatin] Nausea Only       Observations/Objective: Today's Vitals   03/04/19 0850  PainSc: 0-No pain   There is no height or weight on file to calculate BMI.  Physical Exam  Constitutional: She is oriented to person, place, and time.  Neurological: She is alert and oriented to person, place, and  time.     CBC    Component Value Date/Time   WBC 10.3 11/17/2018 1444   RBC 4.87 11/17/2018 1444   HGB 13.6 11/17/2018 1444   HGB 12.2 01/02/2017 1410   HCT 42.3 11/17/2018 1444   HCT 37.3 01/02/2017 1410   PLT 333 11/17/2018 1444   PLT 486 (H) 01/02/2017 1410   MCV 86.9 11/17/2018 1444   MCV 86 01/02/2017 1410   MCV 96 01/29/2012 0428   MCH 27.9 11/17/2018 1444   MCHC 32.2 11/17/2018 1444   RDW 15.1 11/17/2018 1444   RDW 16.1 (H) 01/02/2017 1410   RDW 12.7 01/29/2012 0428   LYMPHSABS 2.0 11/17/2018 1444   LYMPHSABS 3.9 (H) 01/02/2017 1410   LYMPHSABS 1.9 01/29/2012 0428   MONOABS 1.0 11/17/2018 1444   MONOABS 2.0 (H) 01/29/2012 0428   EOSABS 0.1 11/17/2018 1444   EOSABS 0.4 01/02/2017 1410   EOSABS 0.1 01/29/2012 0428   BASOSABS 0.1 11/17/2018 1444   BASOSABS 0.1 01/02/2017 1410   BASOSABS 0.1 01/29/2012 0428    CMP     Component Value Date/Time   NA 136 11/17/2018 1444   NA 141 01/02/2017 1410   NA 136 01/27/2012 0637   K 4.3 11/17/2018 1444   K 4.3 01/27/2012 0637   CL 101 11/17/2018 1444   CL 102 01/27/2012 0637   CO2 26 11/17/2018 1444   CO2 25 01/27/2012 0637   GLUCOSE 93 11/17/2018 1444   GLUCOSE 119 (H) 01/27/2012 0637   BUN 14 11/17/2018 1444   BUN 14 01/02/2017 1410   BUN 15 01/27/2012 0637   CREATININE 0.76 11/17/2018 1444   CREATININE 0.67 01/27/2012 0637   CALCIUM 8.9 11/17/2018 1444   CALCIUM 7.7 (L) 01/27/2012 0637   PROT 8.0 11/17/2018 1444   PROT 7.0 01/02/2017 1410   PROT 7.6 01/26/2012 0021   ALBUMIN 3.8 11/17/2018 1444   ALBUMIN 3.9 01/02/2017 1410   ALBUMIN 3.5 01/26/2012 0021   AST 22 11/17/2018 1444   AST 17 01/26/2012 0021   ALT 21 11/17/2018 1444   ALT 19 01/26/2012 0021   ALKPHOS 87 11/17/2018 1444   ALKPHOS 87 01/26/2012 0021   BILITOT 0.4 11/17/2018 1444   BILITOT <0.2 01/02/2017 1410   BILITOT 0.2 01/26/2012 0021   GFRNONAA >60 11/17/2018 1444   GFRNONAA >60 01/27/2012 0637   GFRAA >60 11/17/2018 1444   GFRAA >60  01/27/2012 0637     Assessment and Plan: 1. Malignant neoplasm of transverse colon ([place])   2. Monocytosis     CT chest abdomen pelvis on 03/03/2019 images were independently reviewed and discussed with patient. No evidence of recurrence of metastatic disease. We will reschedule patient to get labs done in 1 week, cbc cmp CEA  Monocytosis,  Previously discussed about bone marrow evaluation for persistent monocytosis.  She declined on 05/22/2018.  Continue to monitor.  Follow Up Instructions: Lab MD 3 months.   I discussed the assessment and treatment plan with the patient. The patient was provided an opportunity to ask questions and all were answered. The patient agreed with the plan and demonstrated an understanding of the instructions.  The patient was advised to call back or seek an in-person evaluation if the symptoms worsen or if the condition fails to improve as anticipated.    Earlie Server, MD 03/04/2019

## 2019-03-05 ENCOUNTER — Other Ambulatory Visit: Payer: Medicare HMO

## 2019-03-05 ENCOUNTER — Ambulatory Visit: Payer: Medicare HMO | Admitting: Oncology

## 2019-03-10 ENCOUNTER — Inpatient Hospital Stay: Payer: Medicare HMO

## 2019-03-10 ENCOUNTER — Other Ambulatory Visit: Payer: Self-pay

## 2019-03-10 ENCOUNTER — Telehealth: Payer: Self-pay | Admitting: Family Medicine

## 2019-03-10 DIAGNOSIS — D72821 Monocytosis (symptomatic): Secondary | ICD-10-CM | POA: Diagnosis not present

## 2019-03-10 DIAGNOSIS — J449 Chronic obstructive pulmonary disease, unspecified: Secondary | ICD-10-CM | POA: Diagnosis not present

## 2019-03-10 DIAGNOSIS — Z87891 Personal history of nicotine dependence: Secondary | ICD-10-CM | POA: Diagnosis not present

## 2019-03-10 DIAGNOSIS — Z791 Long term (current) use of non-steroidal anti-inflammatories (NSAID): Secondary | ICD-10-CM | POA: Diagnosis not present

## 2019-03-10 DIAGNOSIS — E785 Hyperlipidemia, unspecified: Secondary | ICD-10-CM | POA: Diagnosis not present

## 2019-03-10 DIAGNOSIS — Z7951 Long term (current) use of inhaled steroids: Secondary | ICD-10-CM | POA: Diagnosis not present

## 2019-03-10 DIAGNOSIS — Z853 Personal history of malignant neoplasm of breast: Secondary | ICD-10-CM | POA: Diagnosis not present

## 2019-03-10 DIAGNOSIS — G629 Polyneuropathy, unspecified: Secondary | ICD-10-CM | POA: Diagnosis not present

## 2019-03-10 DIAGNOSIS — Z79899 Other long term (current) drug therapy: Secondary | ICD-10-CM | POA: Diagnosis not present

## 2019-03-10 DIAGNOSIS — C184 Malignant neoplasm of transverse colon: Secondary | ICD-10-CM

## 2019-03-10 DIAGNOSIS — E559 Vitamin D deficiency, unspecified: Secondary | ICD-10-CM | POA: Diagnosis not present

## 2019-03-10 LAB — COMPREHENSIVE METABOLIC PANEL
ALT: 10 U/L (ref 0–44)
AST: 13 U/L — ABNORMAL LOW (ref 15–41)
Albumin: 3.6 g/dL (ref 3.5–5.0)
Alkaline Phosphatase: 80 U/L (ref 38–126)
Anion gap: 7 (ref 5–15)
BUN: 12 mg/dL (ref 8–23)
CO2: 28 mmol/L (ref 22–32)
Calcium: 8.6 mg/dL — ABNORMAL LOW (ref 8.9–10.3)
Chloride: 101 mmol/L (ref 98–111)
Creatinine, Ser: 0.74 mg/dL (ref 0.44–1.00)
GFR calc Af Amer: >60 mL/min (ref >60)
GFR calc non Af Amer: >60 mL/min (ref >60)
Glucose, Bld: 89 mg/dL (ref 70–99)
Potassium: 4.2 mmol/L (ref 3.5–5.1)
Sodium: 136 mmol/L (ref 135–145)
Total Bilirubin: 0.4 mg/dL (ref 0.3–1.2)
Total Protein: 7.6 g/dL (ref 6.5–8.1)

## 2019-03-10 LAB — CBC WITH DIFFERENTIAL/PLATELET
Abs Immature Granulocytes: 0.03 10*3/uL (ref 0.00–0.07)
Basophils Absolute: 0.1 10*3/uL (ref 0.0–0.1)
Basophils Relative: 1 %
Eosinophils Absolute: 0.2 10*3/uL (ref 0.0–0.5)
Eosinophils Relative: 1 %
HCT: 38.7 % (ref 36.0–46.0)
Hemoglobin: 12.6 g/dL (ref 12.0–15.0)
Immature Granulocytes: 0 %
Lymphocytes Relative: 20 %
Lymphs Abs: 2.1 10*3/uL (ref 0.7–4.0)
MCH: 28.4 pg (ref 26.0–34.0)
MCHC: 32.6 g/dL (ref 30.0–36.0)
MCV: 87.4 fL (ref 80.0–100.0)
Monocytes Absolute: 1 10*3/uL (ref 0.1–1.0)
Monocytes Relative: 10 %
Neutro Abs: 7.3 10*3/uL (ref 1.7–7.7)
Neutrophils Relative %: 68 %
Platelets: 376 10*3/uL (ref 150–400)
RBC: 4.43 MIL/uL (ref 3.87–5.11)
RDW: 16.1 % — ABNORMAL HIGH (ref 11.5–15.5)
WBC: 10.7 10*3/uL — ABNORMAL HIGH (ref 4.0–10.5)
nRBC: 0 % (ref 0.0–0.2)

## 2019-03-10 MED ORDER — OMEPRAZOLE 20 MG PO CPDR: 20.0000 mg | DELAYED_RELEASE_CAPSULE | Freq: Every day | ORAL | 4 refills | Status: DC

## 2019-03-10 NOTE — Telephone Encounter (Signed)
Pt called asking for refill on her  Omerprazole 20 mg  [facility]  Thanks  [employer]

## 2019-03-11 LAB — CEA: CEA: 6.5 ng/mL — ABNORMAL HIGH (ref 0.0–4.7)

## 2019-03-14 DIAGNOSIS — S72046A Nondisplaced fracture of base of neck of unspecified femur, initial encounter for closed fracture: Secondary | ICD-10-CM | POA: Diagnosis not present

## 2019-03-14 DIAGNOSIS — R062 Wheezing: Secondary | ICD-10-CM | POA: Diagnosis not present

## 2019-03-14 DIAGNOSIS — J441 Chronic obstructive pulmonary disease with (acute) exacerbation: Secondary | ICD-10-CM | POA: Diagnosis not present

## 2019-03-29 ENCOUNTER — Other Ambulatory Visit: Payer: Self-pay | Admitting: Internal Medicine

## 2019-04-09 ENCOUNTER — Encounter: Payer: Self-pay | Admitting: Family Medicine

## 2019-04-09 ENCOUNTER — Other Ambulatory Visit: Payer: Self-pay

## 2019-04-09 ENCOUNTER — Ambulatory Visit (INDEPENDENT_AMBULATORY_CARE_PROVIDER_SITE_OTHER): Payer: Medicare HMO | Admitting: Family Medicine

## 2019-04-09 VITALS — BP 147/77 | HR 93 | Temp 98.8°F | Ht 65.0 in | Wt 166.0 lb

## 2019-04-09 DIAGNOSIS — I2510 Atherosclerotic heart disease of native coronary artery without angina pectoris: Secondary | ICD-10-CM

## 2019-04-09 DIAGNOSIS — L7682 Other postprocedural complications of skin and subcutaneous tissue: Secondary | ICD-10-CM | POA: Diagnosis not present

## 2019-04-09 DIAGNOSIS — E559 Vitamin D deficiency, unspecified: Secondary | ICD-10-CM

## 2019-04-09 DIAGNOSIS — R1084 Generalized abdominal pain: Secondary | ICD-10-CM | POA: Diagnosis not present

## 2019-04-09 DIAGNOSIS — E785 Hyperlipidemia, unspecified: Secondary | ICD-10-CM | POA: Diagnosis not present

## 2019-04-09 DIAGNOSIS — Z0000 Encounter for general adult medical examination without abnormal findings: Secondary | ICD-10-CM | POA: Diagnosis not present

## 2019-04-09 DIAGNOSIS — R1900 Intra-abdominal and pelvic swelling, mass and lump, unspecified site: Secondary | ICD-10-CM

## 2019-04-09 DIAGNOSIS — M810 Age-related osteoporosis without current pathological fracture: Secondary | ICD-10-CM | POA: Diagnosis not present

## 2019-04-09 NOTE — Progress Notes (Signed)
Patient: Kimberly [name], Female    DOB: 07/16/1946, 73 y.o.   MRN: [id number] Visit Date: 04/09/2019  Today's Provider: Lelon Huh, MD   Chief Complaint  Patient presents with  . Annual Exam   Subjective:     Complete Physical Kimberly [name] is a 73 y.o. female. She feels well.  Pt is concerned she may have a hernia. She reports exercising regularly. She reports she is sleeping fairly well.   She reports she have been having constant pain around prior abdominal surgery sites which becomes severe every time she eats. She is concerned she may have a hernia and has notice some abdominal swelling around surgical scars. She had multiple extensive colon surgeries for colon cancer by Dr. Dahlia Byes last year. She states she does not want to return to his practice due to unpleasant experience at her follow up appointments.  -----------------------------------------------------------  Lipid/Cholesterol, Follow-up:   Last seen for this15 months ago.  Management changes since that visit include stopping pravastatin due to diffuse myalgias, which have not improve since stopping medication. . Last Lipid Panel:    Component Value Date/Time   CHOL 217 (H) 01/20/2018 1206   TRIG 302 01/20/2018   HDL 49 01/20/2018 1206   CHOLHDL 4.4 01/20/2018 1206   LDLCALC 108 (H) 01/20/2018 1206    The 10-year ASCVD risk score Mikey Bussing DC Jr., et al., 2013) is: 14.1%   Values used to calculate the score:     Age: 69 years     Sex: Female     Is Non-Hispanic African American: No     Diabetic: No     Tobacco smoker: No     Systolic Blood Pressure: 259 mmHg     Is BP treated: No     HDL Cholesterol: 49 mg/dL     Total Cholesterol: 217 mg/dL   Wt Readings from Last 3 Encounters:  04/09/19 166 lb (75.3 kg)  02/24/19 161 lb 14.9 oz (73.5 kg)  11/17/18 160 lb 1.6 oz (72.6 kg)    -------------------------------------------------------------------   Review of Systems  Constitutional: Positive for  diaphoresis. Negative for activity change, appetite change, chills, fatigue, fever and unexpected weight change.  HENT: Positive for sneezing. Negative for congestion, dental problem, drooling, ear discharge, ear pain, facial swelling, hearing loss, mouth sores, nosebleeds, postnasal drip, rhinorrhea, sinus pressure, sinus pain, sore throat, tinnitus, trouble swallowing and voice change.   Eyes: Negative.   Respiratory: Positive for cough. Negative for apnea, choking, chest tightness, shortness of breath, wheezing and stridor.   Cardiovascular: Negative.   Gastrointestinal: Positive for abdominal distention, abdominal pain and constipation. Negative for anal bleeding, blood in stool, diarrhea, nausea, rectal pain and vomiting.  Endocrine: Negative.   Genitourinary: Negative.   Musculoskeletal: Positive for arthralgias. Negative for back pain, gait problem, joint swelling, myalgias, neck pain and neck stiffness.  Skin: Negative.   Allergic/Immunologic: Negative.   Neurological: Negative.   Hematological: Negative for adenopathy. Bruises/bleeds easily.  Psychiatric/Behavioral: Negative.     Social History   Socioeconomic History  . Marital status: Divorced    Spouse name: Not on file  . Number of children: 3  . Years of education: Not on file  . Highest education level: 7th grade  Occupational History  . Occupation: retired  Scientific laboratory technician  . Financial resource strain: Somewhat hard  . Food insecurity    Worry: Never true    Inability: Never true  . Transportation needs    Medical:  No    Non-medical: No  Tobacco Use  . Smoking status: Former Smoker    Packs/day: 0.25    Years: 55.00    Pack years: 13.75    Types: Cigarettes    Quit date: 05/21/2017    Years since quitting: 1.8  . Smokeless tobacco: Never Used  . Tobacco comment: started age 74 1/2 to 1 ppd  Substance and Sexual Activity  . Alcohol use: Yes    Alcohol/week: 0.0 standard drinks    Comment: occasionally drinks  beer  . Drug use: No  . Sexual activity: Never  Lifestyle  . Physical activity    Days per week: 0 days    Minutes per session: 0 min  . Stress: Not at all  Relationships  . Social Herbalist on phone: Patient refused    Gets together: Patient refused    Attends religious service: Patient refused    Active member of club or organization: Patient refused    Attends meetings of clubs or organizations: Patient refused    Relationship status: Patient refused  . Intimate partner violence    Fear of current or ex partner: Patient refused    Emotionally abused: Patient refused    Physically abused: Patient refused    Forced sexual activity: Patient refused  Other Topics Concern  . Not on file  Social History Narrative  . Not on file    Past Medical History:  Diagnosis Date  . Breast cancer, left ([place]) 2004   Lumpectomy and rad tx's.  . Chronic back pain   . Colon cancer ([place]) 2018  . COPD (chronic obstructive pulmonary disease) ([place])   . Degenerative disc disease, lumbar 04/2013  . Dyspnea   . Family history of adverse reaction to anesthesia    son arrested after anesthesia  . GERD (gastroesophageal reflux disease)   . Hyperlipidemia   . Iron deficiency anemia 05/27/2017  . Neuropathy   . Osteoarthritis    of knee  . Osteoporosis   . Personal history of chemotherapy 2018   Colon  . Personal history of radiation therapy    Breast 2004  . Personal history of radiation therapy 2018   Colon  . Small bowel obstruction ([place]) 04/16/2017  . Tobacco abuse   . Vitamin D deficiency      Patient Active Problem List   Diagnosis Date Noted  . Postprocedural intraabdominal abscess 09/01/2018  . Dependence on nocturnal oxygen therapy 08/12/2018  . Clostridium difficile colitis 07/31/2018  . S/P colostomy takedown 06/25/2018  . Parastomal hernia without obstruction or gangrene   . Status post colon resection   . Visit for diagnostic endoscopy   . Colostomy status ([place])    . Colostomy prolapse ([place])   . Hypokalemia 05/27/2017  . Hypomagnesemia 05/27/2017  . Iron deficiency anemia 05/27/2017  . Port-A-Cath in place   . Left peroneal vein thrombosis   . Malignant neoplasm of transverse colon ([place]) 04/26/2017  . Cellulitis   . Generalized abdominal pain   . Abdominal pain, generalized   . Loss of weight   . Gastritis without bleeding   . Absence of bladder continence 04/01/2017  . Asthmatic breathing 04/01/2017  . Abnormal abdominal x-ray 02/11/2017  . OP (osteoporosis) 02/11/2017  . Skin lesion 02/11/2017  . Breast swelling 02/11/2017  . Narrowing of intervertebral disc space 02/11/2017  . Family history of colon cancer 02/11/2017  . Foot pain 02/11/2017  . Arthralgia of hip 02/11/2017  . Personal history  of malignant neoplasm of breast 02/11/2017  . Eczema intertrigo 02/11/2017  . Neuropathy 02/11/2017  . Osteoarthrosis, unspecified whether generalized or localized, involving lower leg 02/11/2017  . Coronary atherosclerosis 01/29/2017  . Smoking greater than 30 pack years 01/10/2017  . Vitamin D deficiency 08/21/2016  . Hyperlipidemia 11/10/2015  . Chronic back pain 05/26/2015  . DDD (degenerative disc disease), lumbosacral 05/26/2015  . History of colon polyps 04/27/2007    Past Surgical History:  Procedure Laterality Date  . APPENDECTOMY  1965  . BREAST EXCISIONAL BIOPSY Left 08/01/2003   lumpectomy rad 11/04-2/28/2005  . BREAST SURGERY    . CESAREAN SECTION     x3  . COLON SURGERY    . COLONOSCOPY W/ POLYPECTOMY    . COLONOSCOPY WITH PROPOFOL N/A 04/09/2018   Procedure: COLONOSCOPY WITH PROPOFOL;  Surgeon: Virgel Manifold, MD;  Location: [facility];  Service: Gastroenterology;  Laterality: N/A;  . COLOSTOMY TAKEDOWN N/A 06/25/2018   Procedure: COLOSTOMY TAKEDOWN;  Surgeon: Jules Husbands, MD;  Location: [facility];  Service: General;  Laterality: N/A;  . ESOPHAGOGASTRODUODENOSCOPY (EGD) WITH PROPOFOL N/A 04/04/2017   Procedure:  ESOPHAGOGASTRODUODENOSCOPY (EGD) WITH PROPOFOL;  Surgeon: [name], MD;  Location: [facility];  Service: Endoscopy;  Laterality: N/A;  . FRACTURE SURGERY    . HIP FRACTURE SURGERY Left 01/25/2012  . JOINT REPLACEMENT    . LAPAROTOMY N/A 04/15/2017   Procedure: EXPLORATORY LAPAROTOMY;  Surgeon: Clayburn Pert, MD;  Location: [facility];  Service: General;  Laterality: N/A;  . NECK SURGERY  12/2011  . PARASTOMAL HERNIA REPAIR N/A 12/03/2017   Procedure: HERNIA REPAIR PARASTOMAL;  Surgeon: Jules Husbands, MD;  Location: [facility];  Service: General;  Laterality: N/A;  . PARASTOMAL HERNIA REPAIR N/A 06/25/2018   Procedure: HERNIA REPAIR PARASTOMAL;  Surgeon: Jules Husbands, MD;  Location: [facility];  Service: General;  Laterality: N/A;  . PORTACATH PLACEMENT Right 05/21/2017   Procedure: INSERTION PORT-A-CATH;  Surgeon: Clayburn Pert, MD;  Location: [facility];  Service: General;  Laterality: Right;  . WRIST FRACTURE SURGERY      Her family history includes Asthma in her brother; Colon cancer in her father; Coronary artery disease in her mother; Deep vein thrombosis in her mother; Diabetes in her brother and mother. There is no history of Breast cancer.   Current Outpatient Medications:  .  budesonide-formoterol (SYMBICORT) 80-4.5 MCG/ACT inhaler, Inhale 2 puffs into the lungs 2 (two) times daily., Disp: 3 Inhaler, Rfl: 3 .  cholecalciferol (VITAMIN D) 1000 units tablet, Take 1,000 Units by mouth daily., Disp: , Rfl:  .  feeding supplement (BOOST / RESOURCE BREEZE) LIQD, Take 1 Container by mouth 3 (three) times daily between meals., Disp: 30 Container, Rfl: 0 .  gabapentin (NEURONTIN) 400 MG capsule, Take 1 capsule (400 mg total) by mouth 3 (three) times daily., Disp: 270 capsule, Rfl: 1 .  HYDROcodone-acetaminophen (NORCO) 7.5-325 MG tablet, Take 1-2 tablets by mouth every 6 (six) hours as needed for moderate pain., Disp: 120 tablet, Rfl: 0 .  ibuprofen (ADVIL,MOTRIN) 200 MG tablet,  Take 400 mg by mouth daily as needed for headache or moderate pain., Disp: , Rfl:  .  meloxicam (MOBIC) 15 MG tablet, TAKE 1 TABLET EVERY DAY AS NEEDED, Disp: 90 tablet, Rfl: 3 .  montelukast (SINGULAIR) 10 MG tablet, TAKE 1 TABLET BY MOUTH EVERY DAY, Disp: 90 tablet, Rfl: 0 .  omeprazole (PRILOSEC) 20 MG capsule, Take 1 capsule (20 mg total) by mouth daily., Disp: 90 capsule,  Rfl: 4 .  ondansetron (ZOFRAN) 4 MG tablet, Take 0.5-1 tablets (2-4 mg total) by mouth every 8 (eight) hours as needed for nausea or vomiting., Disp: 20 tablet, Rfl: 0 .  Probiotic Product (PROBIOTIC DAILY PO), Take 1 capsule by mouth daily. , Disp: , Rfl:  .  pyridOXINE (VITAMIN B-6) 50 MG tablet, Take 1 tablet (50 mg total) by mouth daily., Disp: 30 tablet, Rfl: 3 .  Triamcinolone Acetonide (NASACORT ALLERGY 24HR NA), Place 2 sprays into the nose daily. , Disp: , Rfl:  .  vitamin B-12 (CYANOCOBALAMIN) 1000 MCG tablet, Take 1,000 mcg by mouth daily., Disp: , Rfl:  .  pravastatin (PRAVACHOL) 40 MG tablet, TAKE 1 TABLET EVERY DAY (Patient not taking: No sig reported), Disp: 90 tablet, Rfl: 3  Patient Care Team: Birdie Sons, MD as PCP - General (Family Medicine) Earlie Server, MD as Consulting Physician (Oncology) Noreene Filbert, MD as Referring Physician (Radiation Oncology) Jules Husbands, MD as Consulting Physician (General Surgery)     Objective:    Vitals: BP (!) 147/77 (BP Location: Right Arm, Patient Position: Sitting, Cuff Size: Normal)   Pulse 93   Temp 98.8 F (37.1 C) (Oral)   Ht 5\' 5"  (1.651 m)   Wt 166 lb (75.3 kg)   BMI 27.62 kg/m   Physical Exam   General Appearance:    Alert, cooperative, no distress, appears stated age  Head:    Normocephalic, without obvious abnormality, atraumatic  Eyes:    PERRL, conjunctiva/corneas clear, EOM's intact, fundi    benign, both eyes  Ears:    Normal TM's and external ear canals, both ears  Nose:   Nares normal, septum midline, mucosa normal, no drainage     or sinus tenderness  Throat:   Lips, mucosa, and tongue normal; teeth and gums normal  Neck:   Supple, symmetrical, trachea midline, no adenopathy;    thyroid:  no enlargement/tenderness/nodules; no carotid   bruit or JVD  Back:     Symmetric, no curvature, ROM normal, no CVA tenderness  Lungs:     Clear to auscultation bilaterally, respirations unlabored  Chest Wall:    No tenderness or deformity   Heart:    Regular rate and rhythm, S1 and S2 normal, no murmur, rub   or gallop  Breast Exam:    normal appearance, no masses or tenderness  Abdomen:     Soft, non-tender, bowel sounds active all four quadrants,    no masses, no organomegaly. Extensive surgical scarring with some bulging along midline scar, but no overt herniation appreciated.   Pelvic:    deferred  Extremities:   Extremities normal, atraumatic, no cyanosis or edema  Pulses:   2+ and symmetric all extremities  Skin:   Skin color, texture, turgor normal, no rashes or lesions  Lymph nodes:   Cervical, supraclavicular, and axillary nodes normal  Neurologic:   CNII-XII intact, normal strength, sensation and reflexes    throughout     Activities of Daily Living In your present state of health, do you have any difficulty performing the following activities: 01/18/2019 09/01/2018  Hearing? N N  Vision? N N  Difficulty concentrating or making decisions? N N  Walking or climbing stairs? Y N  Comment Right hip pain when climbing stairs. -  Dressing or bathing? N N  Doing errands, shopping? N N  Preparing Food and eating ? N -  Using the Toilet? N -  In the past six months, have you accidently  leaked urine? N -  Do you have problems with loss of bowel control? N -  Managing your Medications? N -  Managing your Finances? N -  Housekeeping or managing your Housekeeping? N -  Some recent data might be hidden    Fall Risk Assessment Fall Risk  01/18/2019 10/07/2018 10/01/2018 09/25/2018 09/16/2018  Falls in the past year? 1 0 0 0 0   Number falls in past yr: 0 - - - -  Comment tripped over dog  - - - -  Injury with Fall? 0 - - - -  Follow up Falls prevention discussed - Falls evaluation completed - -     Depression Screen PHQ 2/9 Scores 01/18/2019 03/31/2018 01/12/2018 01/10/2017  PHQ - 2 Score 0 0 0 0  PHQ- 9 Score - - - 0    6CIT Screen 01/10/2017  What Year? 0 points  What month? 0 points  What time? 0 points  Count back from 20 0 points  Months in reverse 2 points  Repeat phrase 0 points  Total Score 2       Assessment & Plan:    Annual Physical Reviewed patient's Family Medical History Reviewed and updated list of patient's medical providers Assessment of cognitive impairment was done Assessed patient's functional ability Established a written schedule for health screening [place] Completed and Reviewed  Exercise Activities and Dietary recommendations Goals    . Decrease sugar intake     Recommend cutting down on sugar and sweets to eating 1 small portion a day versus 3 a day.    . Have 3 meals a day     Recommend eating 3 small meals a day with 2 healthy snack in between.     . Prevent falls     Recommend to remove any items from the home that may cause slips or trips.       Immunization History  Administered Date(s) Administered  . Influenza, High Dose Seasonal PF 07/12/2016, 12/05/2017, 06/09/2018  . Influenza-Unspecified 08/30/2013  . Pneumococcal Conjugate-13 06/30/2014  . Pneumococcal Polysaccharide-23 03/02/2013, 04/17/2017  . Tdap 12/02/2014  . Zoster 12/02/2014    Health Maintenance  Topic Date Due  . COLONOSCOPY  04/10/2019  . INFLUENZA VACCINE  05/01/2019  . DEXA SCAN  05/08/2019  . MAMMOGRAM  12/06/2020  . TETANUS/TDAP  12/01/2024  . Hepatitis C Screening  Completed  . PNA vac Low Risk Adult  Completed     Discussed health benefits of physical activity, and encouraged her to engage in regular exercise appropriate for her age and condition.     ------------------------------------------------------------------------------------------------------------  1. Annual physical exam   2. Atherosclerosis of coronary artery of native heart without angina pectoris, unspecified vessel or lesion type (PER LDCT SCREENING) Asymptomatic. Compliant with medication.  Currently off statin, although no improvements with chronic myalgias. See how lipids look, will probably need to  Be back on statin.  - Lipid panel  3. Osteoporosis, unspecified osteoporosis type, unspecified pathological fracture presence   - DG Bone Density; Future  4. Hyperlipidemia, unspecified hyperlipidemia type See above.  - Lipid panel  5. Vitamin D deficiency  - VITAMIN D 25 Hydroxy (Vit-D Deficiency, Fractures)  6. Generalized abdominal pain Seems to be around extensive surgical scars, cannot rule out herniation. She does not want to return to Dr. Dahlia Byes or Rosana Hoes again, but is willing to see Dr. Bary Castilla.  - Ambulatory referral to General Surgery  7. Pain at surgical incision  - Ambulatory referral  to General Surgery  8. Abdominal wall bulge  - Ambulatory referral to General Surgery  The entirety of the information documented in the History of Present Illness, Review of Systems and Physical Exam were personally obtained by me. Portions of this information were initially documented by Ashley Royalty, CMA and reviewed by me for thoroughness and accuracy.    Lelon Huh, MD  [facility]

## 2019-04-09 NOTE — Patient Instructions (Addendum)
.   Please review the attached list of medications and notify my office if there are any errors.   . Please bring all of your medications to every appointment so we can make sure that our medication list is the same as yours.   . We will have flu vaccines available after Labor Day. Please go to your pharmacy or call the office in early September to schedule you flu shot.   The CDC recommends two doses of Shingrix (the shingles vaccine) separated by 2 to 6 months for adults age 50 years and older. I recommend checking with your insurance plan regarding coverage for this vaccine.      

## 2019-04-12 ENCOUNTER — Telehealth: Payer: Self-pay | Admitting: Family Medicine

## 2019-04-12 ENCOUNTER — Other Ambulatory Visit: Payer: Self-pay | Admitting: Family Medicine

## 2019-04-12 DIAGNOSIS — M5136 Other intervertebral disc degeneration, lumbar region: Secondary | ICD-10-CM

## 2019-04-12 NOTE — Chronic Care Management (AMB) (Signed)
Chronic Care Management  ° °Note ° °04/12/2019 °Name: Kimberly [name] MRN: [id number] DOB: 08/19/1946 ° °Kimberly [name] is a 72 y.o. year old female who is a primary care patient of Fisher, Donald E, MD. I reached out to Lorrene B Dicenzo by phone today in response to a referral sent by [name]'s health plan.   ° °[name] was given information about Chronic Care Management services today including:  °1. CCM service includes personalized support from designated clinical staff supervised by her physician, including individualized plan of care and coordination with other care providers °2. 24/7 contact phone numbers for assistance for urgent and routine care needs. °3. Service will only be billed when office clinical staff spend 20 minutes or more in a month to coordinate care. °4. Only one practitioner may furnish and bill the service in a calendar month. °5. The patient may stop CCM services at any time (effective at the end of the month) by phone call to the office staff. °6. The patient will be responsible for cost sharing (co-pay) of up to 20% of the service fee (after annual deductible is met). ° °Patient agreed to services and verbal consent obtained.  ° °Follow up plan: °Telephone appointment with CCM team member scheduled for: 05/18/2019 ° °Bernice Cicero °Care Guide • [facility] °[place]   [facility]  °??[email or web address]   ??[phone]   ° ° ° °

## 2019-04-13 DIAGNOSIS — R062 Wheezing: Secondary | ICD-10-CM | POA: Diagnosis not present

## 2019-04-13 DIAGNOSIS — S72046A Nondisplaced fracture of base of neck of unspecified femur, initial encounter for closed fracture: Secondary | ICD-10-CM | POA: Diagnosis not present

## 2019-04-13 DIAGNOSIS — J441 Chronic obstructive pulmonary disease with (acute) exacerbation: Secondary | ICD-10-CM | POA: Diagnosis not present

## 2019-04-13 MED ORDER — HYDROCODONE-ACETAMINOPHEN 7.5-325 MG PO TABS: 1.0000 | ORAL_TABLET | Freq: Four times a day (QID) | ORAL | 0 refills | Status: DC | PRN

## 2019-04-13 NOTE — Telephone Encounter (Signed)
Please review

## 2019-04-14 ENCOUNTER — Encounter: Payer: Self-pay | Admitting: Family Medicine

## 2019-04-14 DIAGNOSIS — J449 Chronic obstructive pulmonary disease, unspecified: Secondary | ICD-10-CM | POA: Insufficient documentation

## 2019-04-28 ENCOUNTER — Ambulatory Visit (INDEPENDENT_AMBULATORY_CARE_PROVIDER_SITE_OTHER): Payer: Medicare HMO | Admitting: General Surgery

## 2019-04-28 ENCOUNTER — Encounter: Payer: Self-pay | Admitting: General Surgery

## 2019-04-28 ENCOUNTER — Other Ambulatory Visit: Payer: Self-pay

## 2019-04-28 VITALS — BP 170/85 | HR 83 | Temp 97.7°F | Resp 18 | Ht 65.0 in | Wt 164.0 lb

## 2019-04-28 DIAGNOSIS — K432 Incisional hernia without obstruction or gangrene: Secondary | ICD-10-CM

## 2019-04-28 DIAGNOSIS — E785 Hyperlipidemia, unspecified: Secondary | ICD-10-CM | POA: Diagnosis not present

## 2019-04-28 DIAGNOSIS — E559 Vitamin D deficiency, unspecified: Secondary | ICD-10-CM | POA: Diagnosis not present

## 2019-04-28 DIAGNOSIS — I2510 Atherosclerotic heart disease of native coronary artery without angina pectoris: Secondary | ICD-10-CM | POA: Diagnosis not present

## 2019-04-28 NOTE — Progress Notes (Signed)
Patient ID: Kimberly [name], female   DOB: 24-Aug-1946, 73 y.o.   MRN: [id number]  Chief Complaint  Patient presents with   Follow-up    abdominal wall mass    HPI Kimberly [name] is a 73 y.o. female.  She has been referred by her primary care doctor, Dr. Caryn Section, for evaluation of a bulge in her abdominal wall.  She has a history of multiple abdominal operations, including partial colectomy with colostomy, colostomy takedown, several parastomal hernia repairs, incision and drainage of abdominal wall abscesses.  The majority of these were performed by my partners.  She apparently reported that she had a bulge in her abdominal wall to her primary care doctor.  She says that it has been present for at least 6 months.  She endorses chronic constipation for which she takes over-the-counter Colace.  She denies pain in the mass/bulge.  She has chronic nausea, worse in the mornings, she denies vomiting associated with the nausea.  She denies any symptoms of obstruction, incarceration, or strangulation, she states that because of her multiple postsurgical infections, she did not want to see the other partners in the office who have previously operated on her.   Past Medical History:  Diagnosis Date   Breast cancer, left ([place]) 2004   Lumpectomy and rad tx's.   Chronic back pain    Colon cancer ([place]) 2018   Degenerative disc disease, lumbar 04/2013   Family history of adverse reaction to anesthesia    son arrested after anesthesia   GERD (gastroesophageal reflux disease)    Personal history of chemotherapy 2018   Colon   Personal history of radiation therapy    Breast 2004   Personal history of radiation therapy 2018   Colon   Small bowel obstruction ([place]) 04/16/2017    Past Surgical History:  Procedure Laterality Date   APPENDECTOMY  1965   BREAST EXCISIONAL BIOPSY Left 08/01/2003   lumpectomy rad 11/04-2/28/2005   BREAST SURGERY     CESAREAN SECTION     x3   COLON SURGERY      COLONOSCOPY W/ POLYPECTOMY     COLONOSCOPY WITH PROPOFOL N/A 04/09/2018   Procedure: COLONOSCOPY WITH PROPOFOL;  Surgeon: Virgel Manifold, MD;  Location: [facility];  Service: Gastroenterology;  Laterality: N/A;   COLOSTOMY TAKEDOWN N/A 06/25/2018   Procedure: COLOSTOMY TAKEDOWN;  Surgeon: Jules Husbands, MD;  Location: [facility];  Service: General;  Laterality: N/A;   ESOPHAGOGASTRODUODENOSCOPY (EGD) WITH PROPOFOL N/A 04/04/2017   Procedure: ESOPHAGOGASTRODUODENOSCOPY (EGD) WITH PROPOFOL;  Surgeon: [name], MD;  Location: [place];  Service: Endoscopy;  Laterality: N/A;   FRACTURE SURGERY     HIP FRACTURE SURGERY Left 01/25/2012   JOINT REPLACEMENT     LAPAROTOMY N/A 04/15/2017   Procedure: EXPLORATORY LAPAROTOMY;  Surgeon: Clayburn Pert, MD;  Location: [facility];  Service: General;  Laterality: N/A;   NECK SURGERY  12/2011   PARASTOMAL HERNIA REPAIR N/A 12/03/2017   Procedure: HERNIA REPAIR PARASTOMAL;  Surgeon: Jules Husbands, MD;  Location: [facility];  Service: General;  Laterality: N/A;   PARASTOMAL HERNIA REPAIR N/A 06/25/2018   Procedure: HERNIA REPAIR PARASTOMAL;  Surgeon: Jules Husbands, MD;  Location: [facility];  Service: General;  Laterality: N/A;   PORTACATH PLACEMENT Right 05/21/2017   Procedure: INSERTION PORT-A-CATH;  Surgeon: Clayburn Pert, MD;  Location: [facility];  Service: General;  Laterality: Right;   WRIST FRACTURE SURGERY      Family History  Problem Relation Age  of Onset   Diabetes Mother        type 2   Coronary artery disease Mother    Deep vein thrombosis Mother    Colon cancer Father    Asthma Brother    Diabetes Brother        type 2   Breast cancer Neg Hx     Social History Social History   Tobacco Use   Smoking status: Former Smoker    Packs/day: 0.25    Years: 55.00    Pack years: 13.75    Types: Cigarettes    Quit date: 05/21/2017    Years since quitting: 1.9   Smokeless tobacco: Never Used   Tobacco  comment: started age 24 1/2 to 1 ppd  Substance Use Topics   Alcohol use: Yes    Alcohol/week: 0.0 standard drinks    Comment: occasionally drinks beer   Drug use: No    Allergies  Allergen Reactions   Betadine [Povidone Iodine] Other (See Comments)    Topical iodine she states "if you put it in an open sore it will cause a eating ulcer."   Other Nausea And Vomiting    Antihystamines   Sinus Formula [Cholestatin] Nausea Only    Current Outpatient Medications  Medication Sig Dispense Refill   budesonide-formoterol (SYMBICORT) 80-4.5 MCG/ACT inhaler Inhale 2 puffs into the lungs 2 (two) times daily. 3 Inhaler 3   cholecalciferol (VITAMIN D) 1000 units tablet Take 1,000 Units by mouth daily.     gabapentin (NEURONTIN) 400 MG capsule Take 1 capsule (400 mg total) by mouth 3 (three) times daily. 270 capsule 1   HYDROcodone-acetaminophen (NORCO) 7.5-325 MG tablet Take 1-2 tablets by mouth every 6 (six) hours as needed for moderate pain. 120 tablet 0   ibuprofen (ADVIL,MOTRIN) 200 MG tablet Take 400 mg by mouth daily as needed for headache or moderate pain.     meloxicam (MOBIC) 15 MG tablet TAKE 1 TABLET EVERY DAY AS NEEDED 90 tablet 3   montelukast (SINGULAIR) 10 MG tablet TAKE 1 TABLET BY MOUTH EVERY DAY 90 tablet 0   omeprazole (PRILOSEC) 20 MG capsule Take 1 capsule (20 mg total) by mouth daily. 90 capsule 4   ondansetron (ZOFRAN) 4 MG tablet Take 0.5-1 tablets (2-4 mg total) by mouth every 8 (eight) hours as needed for nausea or vomiting. 20 tablet 0   Probiotic Product (PROBIOTIC DAILY PO) Take 1 capsule by mouth daily.      pyridOXINE (VITAMIN B-6) 50 MG tablet Take 1 tablet (50 mg total) by mouth daily. 30 tablet 3   Triamcinolone Acetonide (NASACORT ALLERGY 24HR NA) Place 2 sprays into the nose daily.      No current facility-administered medications for this visit.     Review of Systems Review of Systems  All other systems reviewed and are negative. Are as  discussed in the history of present illness  Blood pressure (!) 170/85, pulse 83, temperature 97.7 F (36.5 C), temperature source Temporal, resp. rate 18, height 5\' 5"  (1.651 m), weight 164 lb (74.4 kg), SpO2 93 %.  Physical Exam Physical Exam Constitutional:      General: She is not in acute distress.    Comments: Somewhat disheveled appearance.  HENT:     Head: Normocephalic and atraumatic.     Nose:     Comments: Covered with a mask secondary to COVID-19 precautions    Mouth/Throat:     Comments: Covered with a mask secondary to COVID-19 precautions Eyes:  General: No scleral icterus.       Right eye: No discharge.        Left eye: No discharge.  Neck:     Musculoskeletal: Normal range of motion.     Comments: No thyroid masses or thyromegaly appreciated. Cardiovascular:     Rate and Rhythm: Normal rate and regular rhythm.     Pulses: Normal pulses.  Pulmonary:     Breath sounds: Wheezing present.  Abdominal:     General: Bowel sounds are normal.     Palpations: Abdomen is soft.     Tenderness: There is no abdominal tenderness.       Comments: Surgical scars present.  With Valsalva, 2 bulges arise adjacent to the vertical midline incision.  I am unable to appreciate a fascial defect.  Genitourinary:    Comments: Deferred Musculoskeletal:     Right lower leg: No edema.     Left lower leg: No edema.  Lymphadenopathy:     Cervical: No cervical adenopathy.  Skin:    General: Skin is warm and dry.  Neurological:     General: No focal deficit present.     Mental Status: She is alert.  Psychiatric:        Behavior: Behavior normal.     Data Reviewed I reviewed multiple operative and progress notes from my partners.  These include: An operative note from Dr. Lanetta Inch on April 15, 2017 wherein he describes a transverse colectomy with end ostomy and mucous fistula; exploratory laparotomy by Dr. Dahlia Byes in March 2019 for a repair of a complex parastomal hernia as well as  resection of an abdominal wall mass, right hemicolectomy with end ileostomy; a second note from Dr. Dahlia Byes from 25 June 2018 for colostomy takedown with ileocolostomy and another parastomal hernia repair; along with associated in hospital and office progress notes related to these operations.    Assessment This is a 73 year old woman who has had multiple abdominal operations.  She has also had multiple incisional hernias related to her stoma sites.  She is complaining of a bulge or mass present in the area of her prior surgical scars.  With Valsalva, I am able to appreciate these bulges.  They reduce easily when she is not straining.  Although I am unable to palpate fascial defects, I do think she likely has at least 1, and potentially more, recurrent incisional hernias.  Plan I discussed with her the need for CT scan to better determine the extent of the assumed defects.  She adamantly refused to have a scan performed because "iodine causes infections," and she would not permit any administration of contrast.  Upon further discussion, she stated that she did not want additional surgery, but only wanted to know if she had a hernia.  I agreed that she most likely did have at least one hernia but if she did not wish to proceed with surgical intervention, then we would not need to obtain a CT scan.  Thus far, she has not shown any evidence of incarceration or strangulation.  She was provided with a handout describing the symptoms she should be aware of and take action to seek medical care, should they occur.  We will see her on an as-needed basis.  Other than 50% of today's 45-minute visit was spent in the coordination of patient care and counseling.    Fredirick Maudlin 04/28/2019, 12:33 PM

## 2019-04-28 NOTE — Patient Instructions (Addendum)
Try Miralax for your constipation. You may purchase this at [facility] or the drug store over the counter.    Ventral Hernia  A ventral hernia is a bulge of tissue from inside the abdomen that pushes through a weak area of the muscles that form the front wall of the abdomen. The tissues inside the abdomen are inside a sac (peritoneum). These tissues include the small intestine, large intestine, and the fatty tissue that covers the intestines (omentum). Sometimes, the bulge that forms a hernia contains intestines. Other hernias contain only fat. Ventral hernias do not go away without surgical treatment. There are several types of ventral hernias. You may have:  A hernia at an incision site from previous abdominal surgery (incisional hernia).  A hernia just above the belly button (epigastric hernia), or at the belly button (umbilical hernia). These types of hernias can develop from heavy lifting or straining.  A hernia that comes and goes (reducible hernia). It may be visible only when you lift or strain. This type of hernia can be pushed back into the abdomen (reduced).  A hernia that traps abdominal tissue inside the hernia (incarcerated hernia). This type of hernia does not reduce.  A hernia that cuts off blood flow to the tissues inside the hernia (strangulated hernia). The tissues can start to die if this happens. This is a very painful bulge that cannot be reduced. A strangulated hernia is a medical emergency. What are the causes? This condition is caused by abdominal tissue putting pressure on an area of weakness in the abdominal muscles. What increases the risk? The following factors may make you more likely to develop this condition:  Being female.  Being 47 or older.  Being overweight or obese.  Having had previous abdominal surgery, especially if there was an infection after surgery.  Having had an injury to the abdominal wall.  Having had several pregnancies.  Having a buildup  of fluid inside the abdomen (ascites). What are the signs or symptoms? The only symptom of a ventral hernia may be a painless bulge in the abdomen. A reducible hernia may be visible only when you strain, cough, or lift. Other symptoms may include:  Dull pain.  A feeling of pressure. Signs and symptoms of a strangulated hernia may include:  Increasing pain.  Nausea and vomiting.  Pain when pressing on the hernia.  The skin over the hernia turning red or purple.  Constipation.  Blood in the stool (feces). How is this diagnosed? This condition may be diagnosed based on:  Your symptoms.  Your medical history.  A physical exam. You may be asked to cough or strain while standing. These actions increase the pressure inside your abdomen and force the hernia through the opening in your muscles. Your health care provider may try to reduce the hernia by pressing on it.  Imaging studies, such as an ultrasound or CT scan. How is this treated? This condition is treated with surgery. If you have a strangulated hernia, surgery is done as soon as possible. If your hernia is small and not incarcerated, you may be asked to lose some weight before surgery. Follow these instructions at home:  Follow instructions from your health care provider about eating or drinking restrictions.  If you are overweight, your health care provider may recommend that you increase your activity level and eat a healthier diet.  Do not lift anything that is heavier than 10 lb (4.5 kg).  Return to your normal activities as told by  your health care provider. Ask your health care provider what activities are safe for you. You may need to avoid activities that increase pressure on your hernia.  Take over-the-counter and prescription medicines only as told by your health care provider.  Keep all follow-up visits as told by your health care provider. This is important. Contact a health care provider if:  Your hernia  gets larger.  Your hernia becomes painful. Get help right away if:  Your hernia becomes increasingly painful.  You have pain along with any of the following: ? Changes in skin color in the area of the hernia. ? Nausea. ? Vomiting. ? Fever. Summary  A ventral hernia is a bulge of tissue from inside the abdomen that pushes through a weak area of the muscles that form the front wall of the abdomen.  This condition is treated with surgery, which may be urgent depending on your hernia.  Do not lift anything that is heavier than 10 lb (4.5 kg), and follow activity instructions from your health care provider. This information is not intended to replace advice given to you by your health care provider. Make sure you discuss any questions you have with your health care provider. Document Released: 09/02/2012 Document Revised: 10/29/2017 Document Reviewed: 04/07/2017 Elsevier Patient Education  2020 [employer].

## 2019-04-29 ENCOUNTER — Ambulatory Visit: Payer: Medicare HMO | Admitting: General Surgery

## 2019-04-29 LAB — VITAMIN D 25 HYDROXY (VIT D DEFICIENCY, FRACTURES): Vit D, 25-Hydroxy: 20.7 ng/mL — ABNORMAL LOW (ref 30.0–100.0)

## 2019-04-29 LAB — LIPID PANEL
Chol/HDL Ratio: 4.3 ratio (ref 0.0–4.4)
Cholesterol, Total: 167 mg/dL (ref 100–199)
HDL: 39 mg/dL — ABNORMAL LOW (ref >39)
LDL Calculated: 89 mg/dL (ref 0–99)
Triglycerides: 197 mg/dL — ABNORMAL HIGH (ref 0–149)
VLDL Cholesterol Cal: 39 mg/dL (ref 5–40)

## 2019-04-30 ENCOUNTER — Telehealth: Payer: Self-pay

## 2019-04-30 NOTE — Telephone Encounter (Signed)
Pt advised.   Thanks,   -Kehinde Bowdish  

## 2019-04-30 NOTE — Telephone Encounter (Signed)
-----   Message from Birdie Sons, MD sent at 04/29/2019  9:28 AM EDT ----- Vitamin d is better, but still low, need to double the dose of vitamin d supplements she is taking. Cholesterol is better at 167. Follow up 6 months.

## 2019-05-10 ENCOUNTER — Other Ambulatory Visit: Payer: Self-pay | Admitting: Family Medicine

## 2019-05-11 MED ORDER — ONDANSETRON HCL 4 MG PO TABS: 2.0000 mg | ORAL_TABLET | Freq: Three times a day (TID) | ORAL | 0 refills | Status: DC | PRN

## 2019-05-11 NOTE — Telephone Encounter (Signed)
Please review for Dr. Fisher.   Thanks,   -Anahit Klumb  

## 2019-05-14 DIAGNOSIS — J441 Chronic obstructive pulmonary disease with (acute) exacerbation: Secondary | ICD-10-CM | POA: Diagnosis not present

## 2019-05-14 DIAGNOSIS — S72046A Nondisplaced fracture of base of neck of unspecified femur, initial encounter for closed fracture: Secondary | ICD-10-CM | POA: Diagnosis not present

## 2019-05-14 DIAGNOSIS — R062 Wheezing: Secondary | ICD-10-CM | POA: Diagnosis not present

## 2019-05-17 ENCOUNTER — Other Ambulatory Visit: Payer: Self-pay | Admitting: Family Medicine

## 2019-05-17 DIAGNOSIS — M5136 Other intervertebral disc degeneration, lumbar region: Secondary | ICD-10-CM

## 2019-05-17 MED ORDER — HYDROCODONE-ACETAMINOPHEN 7.5-325 MG PO TABS: 1.0000 | ORAL_TABLET | Freq: Four times a day (QID) | ORAL | 0 refills | Status: DC | PRN

## 2019-05-18 ENCOUNTER — Ambulatory Visit (INDEPENDENT_AMBULATORY_CARE_PROVIDER_SITE_OTHER): Payer: Medicare HMO

## 2019-05-18 ENCOUNTER — Other Ambulatory Visit: Payer: Self-pay

## 2019-05-18 DIAGNOSIS — J449 Chronic obstructive pulmonary disease, unspecified: Secondary | ICD-10-CM | POA: Diagnosis not present

## 2019-05-18 NOTE — Chronic Care Management (AMB) (Signed)
Chronic Care Management   Initial Visit Note  05/19/2019 Name: Kimberly [name] MRN: [id number] DOB: Nov 01, 1945  Subjective: "I don't even know if I really have COPD, that's just what I was told in the hospital"  Objective:  BP Readings from Last 3 Encounters:  04/28/19 (!) 170/85  04/09/19 (!) 147/77  02/24/19 (!) 182/100    Assessment: [name] is a 73 year old female who see Dr. Lelon Huh for primary care. She is referred for CCM services by her health plan. Today, RN CM completed initial health assessment and discussed patients health goals.  Review of patient status, including review of consultants reports, relevant laboratory and other test results, and collaboration with appropriate care team members and the patient's provider was performed as part of comprehensive patient evaluation and provision of chronic care management services.    Advanced Directives 05/18/2019  Does Patient Have a Medical Advance Directive? Yes  Type of Advance Directive [place]  Does patient want to make changes to medical advance directive? No - Patient declined  Copy of [place] in Chart? No - copy requested  Would patient like information on creating a medical advance directive? -  Some encounter information is confidential and restricted. Go to Review Flowsheets activity to see all data.     Goals Addressed            This Visit's Progress   . I don't know what a COPD action plan is (pt-stated)       [name] states she was told she had COPD earlier this year during a hospitalization. She has never had any education about COPD self care management. She is unaware of the COPD action plan and symptom management. She does admit to shortness of breath and chronic cough with exertion. She no longer smokes. She uses her inhaler as prescribed.  Current Barriers:   Knowledge deficit related to basic COPD self care/management  Knowledge deficit  related to basic understanding of how inhaled medications work  Knowledge deficit related to importance of energy conservation   Nurse Case Manager Clinical Goal(s):  Over the next 14 days patient will report using inhalers as prescribed including rinsing mouth after use  Over the next 14 days patient will report utilizing pursed lip breathing for shortness of breath  Over the next 14 days, patient will be able to verbalize understanding of COPD action plan and when to seek appropriate levels of medical care   Interventions:   Provided patient with basic written and verbal COPD education on self care/management/and exacerbation prevention   Provided patient with COPD action plan and reinforced importance of daily self assessment  Provided written and verbal instructions on pursed lip breathing and utilized returned demonstration as teach back  COPD ACTION PLAN Actions to Take if My Symptoms Get Worse   WHAT ZONE ARE YOU IN TODAY? Green, Yellow, or Red?    GREEN ZONE: I am doing well today.   Symptoms Actions .  Usual activity and exercise level . Usual amounts of cough or phlegm/musuc . Sleep well at night . Appetite is good . Continue to take daily "maintenance" medicines (the ones you take "no matter what" until your doctor adjusts them for you) . Use oxygen as prescribed . Continue regular exercise/diet plan . At all times avoid cigarette smoke, inhaled iritants     YELLOW ZONE: I am having a bad day or a COPD flare.  Symptoms Actions .  More breathless than usual . I have less energy for my daily activities . Increased or thicker phlegm/mucus . Using quick relief inhaler/nebulizer more often . More coughing than usual . I feel like I have a "chest cold" . Poor sleep and my symptoms woke me up . My appetite is not good . My medicine is not helping . Swelling of ankles more than usual . Continue daily medications . Use quick relief inhaler or nebulizer every 4 hours  . Use oxygen as prescribed . Get plenty of rest . Use pursed lip breathing . At all times avoid cigarette smoke, inhaled irritants.  Marland Kitchen CALL PROVIDER for same day or next day appointment for the following:  o If symptoms are not improving within 48 hours of onset or o If symptoms are not improving after quick relief inhaler or nebulizer o Start an oral corticosteroid (specify name, dose, and duration) . Start an antibiotic (specify name, dose, duration)    RED ZONE: I NEED URGENT MEDICAL CARE  Symptoms Actions .  Severe shortness of breath even at rest . Severe shortness of breath even after quick relief inhaler or nebulizer . Not able to lay flat because of breathing . Fever or shaking chills . Feeling confused or very drowsy . Chest pains . Coughin up blood . Quick relief inhaler or nebulizer not effective . Call 911 or have someone take you to the nearest emergency room . Call provider for now or same day appointment     3-2-1 PLAN: If any of the 3 main COPD symptoms (Cough, Shortness of Breath, or Mucus Production) change for 2 days or more, your number 1 priority if to call your doctor.      Patient Self Care Activities:   Take medications as prescribed including inhalers  Practice and use pursed lip breathing for shortness of breath recovery and prevention  Self assess COPD action plan zone and make appointment with provider if you have been in the yellow zone for 48 hours without improvement.  Engage in lite exercise as tolerated 3-5 days a week  Utilize infection prevention strategies to reduce risk of respiratory infection      . I really need a blood pressure cuff (pt-stated)       [name] has not been diagnosed with HTN, however her last 3 readings indicate an elevated BP. She is willing to keep track of her BP at home but does not have a BP cuff. She is unaware of her [employer] OTC benefits. She agrees to order BP cuff and check and record BP weekly. Will follow up  with HTN education as needed once BP is monitored at home.  Current Barriers:  Marland Kitchen Knowledge Deficits related to [facility] medicare OTC benefits  Nurse Case Manager Clinical Goal(s):  Marland Kitchen Over the next 30 days, patient will utilize [employer] OTC benefits to purchase a BP cuff and other needed over the counter items  Interventions:  . Provided patient with [employer] medicare OTC benefit web address and walked patient through ordering BP cuff through her My [employer] account. . Provided patient with telephone number for [employer] OTC Benefit for requesting catalogue  Patient Self Care Activities:  . Self administers medications as prescribed . Attends all scheduled provider appointments . Calls pharmacy for medication refills . Attends church or other social activities . Performs ADL's independently . Performs IADL's independently . Calls provider office for new concerns or questions  Initial goal documentation     Follow up plan:  The care management team will reach out to the patient again over the next 14 days to ensure receipt of educational materials.   The patient has been provided with contact information for the care management team and has been advised to call with any health related questions or concerns.    Ural Acree E. Rollene Rotunda, RN, BSN Nurse Care Coordinator [facility] [phone]

## 2019-05-19 NOTE — Patient Instructions (Addendum)
Thank you allowing the Chronic Care Management Team to be a part of your care! It was a pleasure speaking with you today!  1. Please read the information provided below about COPD and symptoms 2. Please use the COPD action plan below to assess your symptoms daily. IF YOU ARE IN THE YELLOW ZONE PLEASE NOTIFY DR Caryn Section IF YOUR SYMPTOMS WORSEN OR DO NOT IMPROVE WITHIN 48 HOURS. 3. Continue to use your Symbicort daily as prescribed 4. Continue to wear your oxygen at night. Do not increase liter flow without doctors OK 5. Please DO NOT take Mobic and Ibuprofen/Advil/Motrin together. These are in the same class of drugs and can cause damage to your kidneys in large doses and cause you to bleed easier.  CCM (Chronic Care Management) Team   Trish Fountain RN, BSN Nurse Care Coordinator  [phone]  Ruben Reason PharmD  Clinical Pharmacist  [phone]   Elliot Gurney, LCSW Clinical Social Worker [phone]  Goals Addressed            This Visit's Progress   . I don't know what a COPD action plan is (pt-stated)       Current Barriers:   Knowledge deficit related to basic COPD self care/management  Knowledge deficit related to basic understanding of how inhaled medications work  Knowledge deficit related to importance of energy conservation   Nurse Case Manager Clinical Goal(s):  Over the next 14 days patient will report using inhalers as prescribed including rinsing mouth after use  Over the next 14 days patient will report utilizing pursed lip breathing for shortness of breath  Over the next 14 days, patient will be able to verbalize understanding of COPD action plan and when to seek appropriate levels of medical care   Interventions:   Provided patient with basic written and verbal COPD education on self care/management/and exacerbation prevention   Provided patient with COPD action plan and reinforced importance of daily self assessment  Provided written and  verbal instructions on pursed lip breathing and utilized returned demonstration as teach back  COPD ACTION PLAN Actions to Take if My Symptoms Get Worse   WHAT ZONE ARE YOU IN TODAY? Green, Yellow, or Red?    GREEN ZONE: I am doing well today.   Symptoms Actions .  Usual activity and exercise level . Usual amounts of cough or phlegm/musuc . Sleep well at night . Appetite is good . Continue to take daily "maintenance" medicines (the ones you take "no matter what" until your doctor adjusts them for you) . Use oxygen as prescribed . Continue regular exercise/diet plan . At all times avoid cigarette smoke, inhaled iritants     YELLOW ZONE: I am having a bad day or a COPD flare.  Symptoms Actions .  More breathless than usual . I have less energy for my daily activities . Increased or thicker phlegm/mucus . Using quick relief inhaler/nebulizer more often . More coughing than usual . I feel like I have a "chest cold" . Poor sleep and my symptoms woke me up . My appetite is not good . My medicine is not helping . Swelling of ankles more than usual . Continue daily medications . Use quick relief inhaler or nebulizer every 4 hours . Use oxygen as prescribed . Get plenty of rest . Use pursed lip breathing . At all times avoid cigarette smoke, inhaled irritants.  Marland Kitchen CALL PROVIDER for same day or next day appointment for the following:  o If symptoms  are not improving within 48 hours of onset or o If symptoms are not improving after quick relief inhaler or nebulizer o Start an oral corticosteroid (specify name, dose, and duration) . Start an antibiotic (specify name, dose, duration)    RED ZONE: I NEED URGENT MEDICAL CARE  Symptoms Actions .  Severe shortness of breath even at rest . Severe shortness of breath even after quick relief inhaler or nebulizer . Not able to lay flat because of breathing . Fever or shaking chills . Feeling confused or very drowsy . Chest pains . Coughin  up blood . Quick relief inhaler or nebulizer not effective . Call 911 or have someone take you to the nearest emergency room . Call provider for now or same day appointment     3-2-1 PLAN: If any of the 3 main COPD symptoms (Cough, Shortness of Breath, or Mucus Production) change for 2 days or more, your number 1 priority if to call your doctor.      Patient Self Care Activities:   Take medications as prescribed including inhalers  Practice and use pursed lip breathing for shortness of breath recovery and prevention  Self assess COPD action plan zone and make appointment with provider if you have been in the yellow zone for 48 hours without improvement.  Engage in lite exercise as tolerated 3-5 days a week  Utilize infection prevention strategies to reduce risk of respiratory infection      . I really need a blood pressure cuff (pt-stated)       Current Barriers:  Marland Kitchen Knowledge Deficits related to [facility] medicare OTC benefits  Nurse Case Manager Clinical Goal(s):  Marland Kitchen Over the next 30 days, patient will utilize [employer] OTC benefits to purchase a BP cuff and other needed over the counter items  Interventions:  . Provided patient with [employer] medicare OTC benefit web address and walked patient through ordering BP cuff through her [employer] account. . Provided patient with telephone number for [employer] OTC Benefit for requesting catalogue  Patient Self Care Activities:  . Self administers medications as prescribed . Attends all scheduled provider appointments . Calls pharmacy for medication refills . Attends church or other social activities . Performs ADL's independently . Performs IADL's independently . Calls provider office for new concerns or questions  Initial goal documentation    . Patient Stated (pt-stated)         Print copy of patient instructions provided.   Telephone follow up appointment with care management team member scheduled for: 2 weeks  SYMPTOMS OF  A STROKE   You have any symptoms of stroke. "BE FAST" is an easy way to remember the main warning signs: ? B - Balance. Signs are dizziness, sudden trouble walking, or loss of balance. ? E - Eyes. Signs are trouble seeing or a sudden change in how you see. ? F - Face. Signs are sudden weakness or loss of feeling of the face, or the face or eyelid drooping on one side. ? A - Arms. Signs are weakness or loss of feeling in an arm. This happens suddenly and usually on one side of the body. ? S - Speech. Signs are sudden trouble speaking, slurred speech, or trouble understanding what people say. ? T - Time. Time to call emergency services. Write down what time symptoms started.  You have other signs of stroke, such as: ? A sudden, very bad headache with no known cause. ? Feeling sick to your stomach (nausea). ? Throwing  up (vomiting). ? Jerky movements you cannot control (seizure).  SYMPTOMS OF A HEART ATTACK  What are the signs or symptoms? Symptoms of this condition include:  Chest pain. It may feel like: ? Crushing or squeezing. ? Tightness, pressure, fullness, or heaviness.  Pain in the arm, neck, jaw, back, or upper body.  Shortness of breath.  Heartburn.  Indigestion.  Nausea.  Cold sweats.  Feeling tired.  Sudden lightheadedness.   Chronic Obstructive Pulmonary Disease Chronic obstructive pulmonary disease (COPD) is a long-term (chronic) lung problem. When you have COPD, it is hard for air to get in and out of your lungs. Usually the condition gets worse over time, and your lungs will never return to normal. There are things you can do to keep yourself as healthy as possible.  Your doctor may treat your condition with: ? Medicines. ? Oxygen. ? Lung surgery.  Your doctor may also recommend: ? Rehabilitation. This includes steps to make your body work better. It may involve a team of specialists. ? Quitting smoking, if you smoke. ? Exercise and changes to your  diet. ? Comfort measures (palliative care). Follow these instructions at home: Medicines  Take over-the-counter and prescription medicines only as told by your doctor.  Talk to your doctor before taking any cough or allergy medicines. You may need to avoid medicines that cause your lungs to be dry. Lifestyle  If you smoke, stop. Smoking makes the problem worse. If you need help quitting, ask your doctor.  Avoid being around things that make your breathing worse. This may include smoke, chemicals, and fumes.  Stay active, but remember to rest as well.  Learn and use tips on how to relax.  Make sure you get enough sleep. Most adults need at least 7 hours of sleep every night.  Eat healthy foods. Eat smaller meals more often. Rest before meals.  General instructions  Make sure you get all the shots (vaccines) that your doctor recommends. Ask your doctor about a flu shot and a pneumonia shot. Please get your flu shot late September early October  Use oxygen therapy and pulmonary rehabilitation if told by your doctor. If you need home oxygen therapy, ask your doctor if you should buy a tool to measure your oxygen level (oximeter).  Make a COPD action plan with your doctor. This helps you to know what to do if you feel worse than usual.  Manage any other conditions you have as told by your doctor.  Avoid going outside when it is very hot, cold, or humid.  Avoid people who have a sickness you can catch (contagious).  Keep all follow-up visits as told by your doctor. This is important. Contact a doctor if:  You cough up more mucus than usual.  There is a change in the color or thickness of the mucus.  It is harder to breathe than usual.  Your breathing is faster than usual.  You have trouble sleeping.  You need to use your medicines more often than usual.  You have trouble doing your normal activities such as getting dressed or walking around the house. Get help right away  if:  You have shortness of breath while resting.  You have shortness of breath that stops you from: ? Being able to talk. ? Doing normal activities.  Your chest hurts for longer than 5 minutes.  Your skin color is more blue than usual.  Your pulse oximeter shows that you have low oxygen for longer than 5 minutes.  You have a fever.  You feel too tired to breathe normally. Summary  Chronic obstructive pulmonary disease (COPD) is a long-term lung problem.  The way your lungs work will never return to normal. Usually the condition gets worse over time. There are things you can do to keep yourself as healthy as possible.  Take over-the-counter and prescription medicines only as told by your doctor.  If you smoke, stop. Smoking makes the problem worse. This information is not intended to replace advice given to you by your health care provider. Make sure you discuss any questions you have with your health care provider. Document Released: 03/04/2008 Document Revised: 08/29/2017 Document Reviewed: 10/21/2016 Elsevier Patient Education  [employer] Lip Breathing Pursed lip breathing is a technique to relieve the feeling of being short of breath. Some long-term respiratory conditions, like chronic obstructive pulmonary disease (COPD) and severe asthma, can make it hard to breathe out (exhale) all of the air in your lungs. This can make air that has less oxygen than normal build up in your lungs (air trapping). Trapped air means your lungs fill with less fresh air when you breathe in (inhale). As a result, you feel short of breath. Pursed lip breathing keeps your airways open longer when you exhale and empties more air from your lungs. This makes more space for fresh air when you inhale. Pursed lip breathing can also slow down your breathing and help your body not have to work so hard to breathe. Over time, pursed lip breathing may help you be able to be more physically  active and do more activities.  How to perform pursed lip breathing Being short of breath can make you tense and anxious. Before you start this breathing exercise, take a minute to relax your shoulders and close your eyes. Then: 1. Start the exercise by closing your mouth. 2. Breathe in through your nose, taking a normal breath. You can do this at your normal rate of breathing. If you feel you are not getting enough air, breathe in while slowly counting to 2 or 3. 3. Pucker (purse) your lips as if you were going to whistle. 4. Gently tighten your abdomen muscles or press on your belly to help push the air out. 5. Breathe out slowly through your pursed lips. Take at least twice as long to breathe out as it takes you to breathe in. 6. Make sure that you breathe out all of the air, but do not force air out. 7. Repeat the exercise until your breathing improves. Ask your health care provider how often and how long to do this exercise. Follow these instructions at home:  Take over-the-counter and prescription medicines only as told by your health care provider.  Return to your normal activities as told by your health care provider. Ask your health care provider what activities are safe for you.  Do not use any products that contain nicotine or tobacco, such as cigarettes and e-cigarettes. If you need help quitting, ask your health care provider.  Keep all follow-up visits as told by your health care provider. This is important. Contact a health care provider if:  Your shortness of breath gets worse.  You become less able to exercise or be active.  You develop a cough.  You develop a fever. Get help right away if:  You are struggling to breathe.  Your shortness of breath prevents you from engaging in any activity. Summary  Pursed lip breathing is a breathing  technique that helps to remove trapped air from your lungs. It helps you get more oxygen into your lungs and makes your body have  to work less hard to breathe.  Pursed lip breathing can gradually make you more able to be physically active.  You can do pursed lip breathing on your own at home.  Ask your health care provider how often and how long you should do pursed lip breathing. This information is not intended to replace advice given to you by your health care provider. Make sure you discuss any questions you have with your health care provider. Document Released: 06/25/2008 Document Revised: 08/29/2017 Document Reviewed: 08/08/2016 Elsevier Patient Education  2020 [employer].

## 2019-05-20 ENCOUNTER — Other Ambulatory Visit: Payer: Self-pay | Admitting: Family Medicine

## 2019-05-21 ENCOUNTER — Ambulatory Visit: Payer: Self-pay | Admitting: Pharmacist

## 2019-05-21 ENCOUNTER — Telehealth: Payer: Self-pay

## 2019-05-21 DIAGNOSIS — I4891 Unspecified atrial fibrillation: Secondary | ICD-10-CM

## 2019-05-21 DIAGNOSIS — J449 Chronic obstructive pulmonary disease, unspecified: Secondary | ICD-10-CM

## 2019-05-21 NOTE — Chronic Care Management (AMB) (Signed)
  Chronic Care Management   Note  05/21/2019 Name: Kimberly [name] MRN: [id number] DOB: 12/01/1945  73 y.o. year old female referred to Chronic Care Management clinical pharmacy services by [facility] Trish Fountain  for medication management Chronic conditions include HTN, COPD .   Was unable to reach patient via telephone today and have left HIPAA compliant voicemail asking patient to return my call. (unsuccessful outreach #1).  Follow up plan: A HIPPA compliant phone message was left for the patient providing contact information and requesting a return call.  The care management team will reach out to the patient again over the next 5-7 days.   Ruben Reason, PharmD Clinical Pharmacist [place] [phone]

## 2019-05-31 ENCOUNTER — Ambulatory Visit: Payer: Self-pay | Admitting: Pharmacist

## 2019-05-31 DIAGNOSIS — I4891 Unspecified atrial fibrillation: Secondary | ICD-10-CM

## 2019-05-31 DIAGNOSIS — J449 Chronic obstructive pulmonary disease, unspecified: Secondary | ICD-10-CM

## 2019-05-31 NOTE — Chronic Care Management (AMB) (Signed)
  Chronic Care Management   Note  05/31/2019 Name: Kimberly [name] MRN: [id number] DOB: [birth year]  Introduced clinical pharmacy services, set up initial pharmacy appointment for Thursday, September 3.   Follow up plan: Telephone follow up appointment with care management team member scheduled for: September 3   Ruben Reason, PharmD Clinical Pharmacist [place] [phone]

## 2019-06-02 ENCOUNTER — Inpatient Hospital Stay: Payer: Medicare HMO | Attending: Oncology

## 2019-06-02 ENCOUNTER — Inpatient Hospital Stay (HOSPITAL_BASED_OUTPATIENT_CLINIC_OR_DEPARTMENT_OTHER): Payer: Medicare HMO | Admitting: Oncology

## 2019-06-02 ENCOUNTER — Encounter: Payer: Self-pay | Admitting: Oncology

## 2019-06-02 ENCOUNTER — Other Ambulatory Visit: Payer: Self-pay

## 2019-06-02 VITALS — BP 177/89 | HR 81 | Temp 99.5°F | Resp 16 | Wt 162.5 lb

## 2019-06-02 DIAGNOSIS — Z9221 Personal history of antineoplastic chemotherapy: Secondary | ICD-10-CM | POA: Diagnosis not present

## 2019-06-02 DIAGNOSIS — R970 Elevated carcinoembryonic antigen [CEA]: Secondary | ICD-10-CM | POA: Diagnosis not present

## 2019-06-02 DIAGNOSIS — R140 Abdominal distension (gaseous): Secondary | ICD-10-CM | POA: Insufficient documentation

## 2019-06-02 DIAGNOSIS — C184 Malignant neoplasm of transverse colon: Secondary | ICD-10-CM | POA: Insufficient documentation

## 2019-06-02 DIAGNOSIS — Z933 Colostomy status: Secondary | ICD-10-CM | POA: Diagnosis not present

## 2019-06-02 DIAGNOSIS — R590 Localized enlarged lymph nodes: Secondary | ICD-10-CM | POA: Diagnosis not present

## 2019-06-02 DIAGNOSIS — Z923 Personal history of irradiation: Secondary | ICD-10-CM | POA: Insufficient documentation

## 2019-06-02 DIAGNOSIS — Z79899 Other long term (current) drug therapy: Secondary | ICD-10-CM | POA: Insufficient documentation

## 2019-06-02 DIAGNOSIS — R109 Unspecified abdominal pain: Secondary | ICD-10-CM | POA: Diagnosis not present

## 2019-06-02 DIAGNOSIS — Z825 Family history of asthma and other chronic lower respiratory diseases: Secondary | ICD-10-CM | POA: Diagnosis not present

## 2019-06-02 DIAGNOSIS — R112 Nausea with vomiting, unspecified: Secondary | ICD-10-CM | POA: Diagnosis not present

## 2019-06-02 DIAGNOSIS — Z800 Family history of malignant neoplasm of digestive organs: Secondary | ICD-10-CM | POA: Insufficient documentation

## 2019-06-02 DIAGNOSIS — Z8249 Family history of ischemic heart disease and other diseases of the circulatory system: Secondary | ICD-10-CM | POA: Diagnosis not present

## 2019-06-02 DIAGNOSIS — Z8719 Personal history of other diseases of the digestive system: Secondary | ICD-10-CM | POA: Diagnosis not present

## 2019-06-02 DIAGNOSIS — R634 Abnormal weight loss: Secondary | ICD-10-CM | POA: Insufficient documentation

## 2019-06-02 DIAGNOSIS — Z888 Allergy status to other drugs, medicaments and biological substances status: Secondary | ICD-10-CM | POA: Diagnosis not present

## 2019-06-02 DIAGNOSIS — Z833 Family history of diabetes mellitus: Secondary | ICD-10-CM | POA: Diagnosis not present

## 2019-06-02 DIAGNOSIS — D3500 Benign neoplasm of unspecified adrenal gland: Secondary | ICD-10-CM

## 2019-06-02 DIAGNOSIS — Z853 Personal history of malignant neoplasm of breast: Secondary | ICD-10-CM | POA: Diagnosis not present

## 2019-06-02 DIAGNOSIS — Z87891 Personal history of nicotine dependence: Secondary | ICD-10-CM | POA: Insufficient documentation

## 2019-06-02 DIAGNOSIS — Z86010 Personal history of colonic polyps: Secondary | ICD-10-CM | POA: Insufficient documentation

## 2019-06-02 LAB — CBC WITH DIFFERENTIAL/PLATELET
Abs Immature Granulocytes: 0.04 10*3/uL (ref 0.00–0.07)
Basophils Absolute: 0.1 10*3/uL (ref 0.0–0.1)
Basophils Relative: 1 %
Eosinophils Absolute: 0.1 10*3/uL (ref 0.0–0.5)
Eosinophils Relative: 1 %
HCT: 38.3 % (ref 36.0–46.0)
Hemoglobin: 12.4 g/dL (ref 12.0–15.0)
Immature Granulocytes: 0 %
Lymphocytes Relative: 23 %
Lymphs Abs: 2.3 10*3/uL (ref 0.7–4.0)
MCH: 27.6 pg (ref 26.0–34.0)
MCHC: 32.4 g/dL (ref 30.0–36.0)
MCV: 85.3 fL (ref 80.0–100.0)
Monocytes Absolute: 0.9 10*3/uL (ref 0.1–1.0)
Monocytes Relative: 9 %
Neutro Abs: 6.6 10*3/uL (ref 1.7–7.7)
Neutrophils Relative %: 66 %
Platelets: 347 10*3/uL (ref 150–400)
RBC: 4.49 MIL/uL (ref 3.87–5.11)
RDW: 16.5 % — ABNORMAL HIGH (ref 11.5–15.5)
WBC: 10.1 10*3/uL (ref 4.0–10.5)
nRBC: 0 % (ref 0.0–0.2)

## 2019-06-02 LAB — COMPREHENSIVE METABOLIC PANEL
ALT: 10 U/L (ref 0–44)
AST: 14 U/L — ABNORMAL LOW (ref 15–41)
Albumin: 3.7 g/dL (ref 3.5–5.0)
Alkaline Phosphatase: 66 U/L (ref 38–126)
Anion gap: 9 (ref 5–15)
BUN: 15 mg/dL (ref 8–23)
CO2: 25 mmol/L (ref 22–32)
Calcium: 8.8 mg/dL — ABNORMAL LOW (ref 8.9–10.3)
Chloride: 102 mmol/L (ref 98–111)
Creatinine, Ser: 0.62 mg/dL (ref 0.44–1.00)
GFR calc Af Amer: >60 mL/min (ref >60)
GFR calc non Af Amer: >60 mL/min (ref >60)
Glucose, Bld: 88 mg/dL (ref 70–99)
Potassium: 3.6 mmol/L (ref 3.5–5.1)
Sodium: 136 mmol/L (ref 135–145)
Total Bilirubin: 0.4 mg/dL (ref 0.3–1.2)
Total Protein: 7.6 g/dL (ref 6.5–8.1)

## 2019-06-02 NOTE — Progress Notes (Signed)
Patient is coming in for follow up, she is doing well no complaints. Is aware of the no visitor policy and screening done.

## 2019-06-03 ENCOUNTER — Ambulatory Visit: Payer: Medicare HMO | Admitting: Pharmacist

## 2019-06-03 DIAGNOSIS — E559 Vitamin D deficiency, unspecified: Secondary | ICD-10-CM

## 2019-06-03 DIAGNOSIS — E785 Hyperlipidemia, unspecified: Secondary | ICD-10-CM

## 2019-06-03 LAB — CEA: CEA: 7.8 ng/mL — ABNORMAL HIGH (ref 0.0–4.7)

## 2019-06-03 NOTE — Patient Instructions (Signed)
Thank you allowing the Chronic Care Management Team to be a part of your care!   Please call a member of the CCM (Chronic Care Management) Team with any questions or case management needs:   Suzie Portela, RN, BSN Nurse Care Coordinator  [phone]  Ruben Reason, PharmD  Clinical Pharmacist  [phone]  Elliot Gurney, LCSW Clinical Social Worker [phone]   Goals Addressed            This Visit's Progress   . Blood pressure mangagement (pt-stated)       Current Barriers:  Marland Kitchen Knowledge Deficits related to basic understanding of hypertension pathophysiology and self care management . Uncontrolled high blood pressure  Clinical Pharmacist Goal(s):  Marland Kitchen Over the next 30 days, patient will verbalize understanding of plan for hypertension management . Over the next 30 days, patient will demonstrate improved health management independence as evidenced by checking blood pressure as directed and notifying PCP if SBP>180 or DBP > 90, taking all medications as prescribe, and adhering to a low sodium diet as discussed. . Over the next 30 days, patient will verbalize basic understanding of hypertension disease process and self health management plan as evidenced by patient report  Interventions:  . Evaluation of current treatment plan related to hypertension self management and patient's adherence to plan as established by provider. . Provided education to patient re: stroke prevention, s/s of heart attack and stroke, DASH diet, complications of uncontrolled blood pressure . Collaboration with Dr. Caryn Section regarding medication management of high blood pressure  Patient Self Care Activities:  . Self administers medications as prescribed . Attends all scheduled provider appointments . Calls provider office for new concerns, questions, or BP outside discussed parameters . Checks BP and records as discussed . Follows a low sodium diet/DASH diet  Initial goal documentation       . COMPLETED: I really need a blood pressure cuff (pt-stated)       Current Barriers:  Marland Kitchen Knowledge Deficits related to [facility] medicare OTC benefits  Nurse Case Manager Clinical Goal(s):  Marland Kitchen Over the next 30 days, patient will utilize [employer] OTC benefits to purchase a BP cuff and other needed over the counter items  Interventions:  . Provided patient with [employer] medicare OTC benefit web address and walked patient through ordering BP cuff through her [employer] account. . Provided patient with telephone number for [employer] OTC Benefit for requesting catalogue  Patient Self Care Activities:  . Self administers medications as prescribed . Attends all scheduled provider appointments . Calls pharmacy for medication refills . Attends church or other social activities . Performs ADL's independently . Performs IADL's independently . Calls provider office for new concerns or questions  Initial goal documentation       "STOP LIGHT"  Hypertension (HTN) or High Blood Pressure Definition: A condition in which the pressure in your arteries is higher than it should be. Blood pressure is written as two numbers such as 131/84. The top number represents the pressure in the arteries when the heart beats. The bottom number represents the pressure in the arteries in between heart beats.    Some Hypertension signs/symptoms:  It is very important to know that most people with high blood pressure experience no signs or symptoms. However, in the early stages of hypertension, some may experience the following symptoms: . Dizzy spells or headaches . Nosebleeds . Flushing around the face . Blurred vision or feeling confused . Chest pain or abnormal heartbeat  Things  you can do: Marland Kitchen Decrease the salt in your diet and consume healthy foods . Increase your activity while maintaining a healthy weight . Manage your stress . Don't smoke . Monitor your blood pressure at home with a blood pressure  device

## 2019-06-03 NOTE — Progress Notes (Signed)
[facility] Follow up  Visit:  Patient Care Team: Birdie Sons, MD as PCP - General (Family Medicine) Earlie Server, MD as Consulting Physician (Oncology) Noreene Filbert, MD as Referring Physician (Radiation Oncology) Jules Husbands, MD as Consulting Physician (General Surgery) Benedetto Goad, RN as Registered Nurse Cathi Roan, [facility] (Pharmacist)  PURPOSE OF VISIT: Adjuvant chemotherapy for colon cancer  HISTORY OF PRESENTING ILLNESS: Kimberly [name] 73 y.o. female with PMH listed as below presents for follow up for the management of Stage IIIB Colon Cancer Patient reports a remote history of breast cancer s/p lumpectomy and radiation treatments.   Patient had been having abdominal pain, weight loss and bloating.  CT scan showed partial obstruction at the level of transverse colon and ileocolic mesenteric adenopathy. She was prepared for a colonoscopy on 04/15/2017. She started to have worsened abdominal pain, nausea vomiting, unable to maintain oral intake and was sent to ED. She was found to have bowel obstruction and underwent  exploratory laparotomy with transverse colectomy, with end colostomy and mucous fistula formation for obstructing colon lesion. Differential diagnosis prior to surgery was metastatic breast cancer in colon vs colon cancer. The patient did have a colonoscopy 2 years ago without the mass being present at that time but did have 2 small polyps in the transverse colon that were removed.  04/15/2017 Patient underwent transverse colon resection.  Pathology showed T4aN1 moderate differentiated adenocarcinoma, Grade 2, tumor invades visceral peritoneum, LVI present, negative margin, 3/16 lymph nodes involved with cancer. Low probability of MSI-H.   # Genetic test INVITAE came back negative. No pathogenetic sequence variants or deletions/duplications identified. Results was scanned to [employer] (a copy of the report was provided to patient and her  daughter)  # # baseline CEA is 2.1 # colostomy reversal on 06/25/2018, subsequently developed C. difficile colitis x 2.  Multiple abdominal CT abdomen and pelvis for obtained, not listed here #09/01/2018 CT abdomen pelvis showed abdominal wall abscess,  percutaneous drain placement .treatment with antibiotics with Augmentin 10 days course.  #09/14/2089 CT abdomen/pelvis showed resolution of abscess, no intraabdominal abscess.   Mediport was discontinued.  TREATMENT:  04/15/2017 transverse colon resection 05/27/2017- 11/17/2017 adjuvant FOLFOX, Oxaliplatin was discontinued after 3 cycles due to worsening of pre-existing neuropathy. Finished another 9 cycles of 5-FU.  March -April 2019 adjuvant RT.  06/25/2018 Colostomy Reversal.    INTERVAL HISTORY 73 y.o. female  presents for follow-up of Stage IIIB colon cancer. Reports doing well.  No new complaints. Denies any shortness of breath, fever, chills, diarrhea, constipation, abdominal pain.   Appetite is good.  She has lost a few pounds recently.   Review of Systems  Constitutional: Negative for appetite change, chills, fatigue, fever and unexpected weight change.  HENT:   Negative for hearing loss, lump/mass, nosebleeds and voice change.   Eyes: Negative for eye problems.  Respiratory: Negative for chest tightness, cough and shortness of breath.   Cardiovascular: Negative for chest pain and leg swelling.  Gastrointestinal: Negative for abdominal distention, abdominal pain, blood in stool, constipation, diarrhea and nausea.  Endocrine: Negative for hot flashes.  Genitourinary: Negative for difficulty urinating, dysuria and frequency.   Musculoskeletal: Negative for arthralgias, gait problem and myalgias.  Skin: Negative for itching, rash and wound.  Neurological: Negative for dizziness, extremity weakness, gait problem and headaches.       Chronic numbness and tingling of fingertips and toes.  Hematological: Negative for adenopathy. Does  not bruise/bleed easily.  Psychiatric/Behavioral:  Negative for confusion, decreased concentration and depression. The patient is not nervous/anxious.       MEDICAL HISTORY: Past Medical History:  Diagnosis Date  . Breast cancer, left ([place]) 2004   Lumpectomy and rad tx's.  . Chronic back pain   . Colon cancer ([place]) 2018  . Degenerative disc disease, lumbar 04/2013  . Family history of adverse reaction to anesthesia    son arrested after anesthesia  . GERD (gastroesophageal reflux disease)   . Personal history of chemotherapy 2018   Colon  . Personal history of radiation therapy    Breast 2004  . Personal history of radiation therapy 2018   Colon  . Small bowel obstruction ([place]) 04/16/2017    SURGICAL HISTORY: Past Surgical History:  Procedure Laterality Date  . APPENDECTOMY  1965  . BREAST EXCISIONAL BIOPSY Left 08/01/2003   lumpectomy rad 11/04-2/28/2005  . BREAST SURGERY    . CESAREAN SECTION     x3  . COLON SURGERY    . COLONOSCOPY W/ POLYPECTOMY    . COLONOSCOPY WITH PROPOFOL N/A 04/09/2018   Procedure: COLONOSCOPY WITH PROPOFOL;  Surgeon: Virgel Manifold, MD;  Location: [facility] ENDOSCOPY;  Service: Gastroenterology;  Laterality: N/A;  . COLOSTOMY TAKEDOWN N/A 06/25/2018   Procedure: COLOSTOMY TAKEDOWN;  Surgeon: Jules Husbands, MD;  Location: [facility] ORS;  Service: General;  Laterality: N/A;  . ESOPHAGOGASTRODUODENOSCOPY (EGD) WITH PROPOFOL N/A 04/04/2017   Procedure: ESOPHAGOGASTRODUODENOSCOPY (EGD) WITH PROPOFOL;  Surgeon: [name], MD;  Location: [facility];  Service: Endoscopy;  Laterality: N/A;  . FRACTURE SURGERY    . HIP FRACTURE SURGERY Left 01/25/2012  . JOINT REPLACEMENT    . LAPAROTOMY N/A 04/15/2017   Procedure: EXPLORATORY LAPAROTOMY;  Surgeon: Clayburn Pert, MD;  Location: [facility] ORS;  Service: General;  Laterality: N/A;  . NECK SURGERY  12/2011  . PARASTOMAL HERNIA REPAIR N/A 12/03/2017   Procedure: HERNIA REPAIR PARASTOMAL;  Surgeon: Jules Husbands, MD;  Location: [facility] ORS;  Service: General;  Laterality: N/A;  . PARASTOMAL HERNIA REPAIR N/A 06/25/2018   Procedure: HERNIA REPAIR PARASTOMAL;  Surgeon: Jules Husbands, MD;  Location: [facility] ORS;  Service: General;  Laterality: N/A;  . PORTACATH PLACEMENT Right 05/21/2017   Procedure: INSERTION PORT-A-CATH;  Surgeon: Clayburn Pert, MD;  Location: [facility] ORS;  Service: General;  Laterality: Right;  . WRIST FRACTURE SURGERY      SOCIAL HISTORY: Social History   Socioeconomic History  . Marital status: Divorced    Spouse name: Not on file  . Number of children: 3  . Years of education: Not on file  . Highest education level: 7th grade  Occupational History  . Occupation: retired  Scientific laboratory technician  . Financial resource strain: Somewhat hard  . Food insecurity    Worry: Never true    Inability: Never true  . Transportation needs    Medical: No    Non-medical: No  Tobacco Use  . Smoking status: Former Smoker    Packs/day: 0.25    Years: 55.00    Pack years: 13.75    Types: Cigarettes    Quit date: 05/21/2017    Years since quitting: 2.0  . Smokeless tobacco: Never Used  . Tobacco comment: started age 35 1/2 to 1 ppd  Substance and Sexual Activity  . Alcohol use: Yes    Alcohol/week: 0.0 standard drinks    Comment: occasionally drinks beer  . Drug use: No  . Sexual activity: Never  Lifestyle  . Physical activity  Days per week: 0 days    Minutes per session: 0 min  . Stress: Not at all  Relationships  . Social Herbalist on phone: Patient refused    Gets together: Patient refused    Attends religious service: Patient refused    Active member of club or organization: Patient refused    Attends meetings of clubs or organizations: Patient refused    Relationship status: Patient refused  . Intimate partner violence    Fear of current or ex partner: Patient refused    Emotionally abused: Patient refused    Physically abused: Patient refused    Forced  sexual activity: Patient refused  Other Topics Concern  . Not on file  Social History Narrative  . Not on file    FAMILY HISTORY Family History  Problem Relation Age of Onset  . Diabetes Mother        type 2  . Coronary artery disease Mother   . Deep vein thrombosis Mother   . Colon cancer Father   . Asthma Brother   . Diabetes Brother        type 2  . Breast cancer Neg Hx     ALLERGIES:  is allergic to betadine [povidone iodine]; iodine; other; and sinus formula [cholestatin].  MEDICATIONS:  Current Outpatient Medications  Medication Sig Dispense Refill  . budesonide-formoterol (SYMBICORT) 80-4.5 MCG/ACT inhaler Inhale 2 puffs into the lungs 2 (two) times daily. 3 Inhaler 3  . cholecalciferol (VITAMIN D) 1000 units tablet Take 1,000 Units by mouth daily.    Marland Kitchen gabapentin (NEURONTIN) 400 MG capsule Take 1 capsule (400 mg total) by mouth 3 (three) times daily. 270 capsule 1  . HYDROcodone-acetaminophen (NORCO) 7.5-325 MG tablet Take 1-2 tablets by mouth every 6 (six) hours as needed for moderate pain. 120 tablet 0  . ibuprofen (ADVIL,MOTRIN) 200 MG tablet Take 400 mg by mouth daily as needed for headache or moderate pain.    . meloxicam (MOBIC) 15 MG tablet TAKE 1 TABLET EVERY DAY AS NEEDED 90 tablet 3  . montelukast (SINGULAIR) 10 MG tablet TAKE 1 TABLET BY MOUTH EVERY DAY 90 tablet 0  . omeprazole (PRILOSEC) 20 MG capsule Take 1 capsule (20 mg total) by mouth daily. 90 capsule 4  . ondansetron (ZOFRAN) 4 MG tablet Take 0.5-1 tablets (2-4 mg total) by mouth every 8 (eight) hours as needed for nausea or vomiting. (Patient not taking: Reported on 06/03/2019) 20 tablet 0  . pravastatin (PRAVACHOL) 40 MG tablet TAKE 1 TABLET EVERY DAY 90 tablet 4  . Probiotic Product (PROBIOTIC DAILY PO) Take 1 capsule by mouth daily.     . Triamcinolone Acetonide (NASACORT ALLERGY 24HR NA) Place 2 sprays into the nose daily as needed (allergies).     . docusate sodium (COLACE) 100 MG capsule Take 100  mg by mouth daily.     No current facility-administered medications for this visit.     PHYSICAL EXAMINATION:  ECOG PERFORMANCE STATUS: 1 - Symptomatic but completely ambulatory  Vitals:   06/02/19 1351  BP: (!) 177/89  Pulse: 81  Resp: 16  Temp: 99.5 F (37.5 C)   Filed Weights   06/02/19 1351  Weight: 162 lb 8 oz (73.7 kg)    Physical Exam  Constitutional: She is oriented to person, place, and time. No distress.  HENT:  Head: Normocephalic and atraumatic.  Nose: Nose normal.  Mouth/Throat: Oropharynx is clear and moist. No oropharyngeal exudate.  Eyes: Pupils are equal, round,  and reactive to light. Conjunctivae and EOM are normal. Left eye exhibits no discharge. No scleral icterus.  Neck: Normal range of motion. Neck supple.  Cardiovascular: Normal rate, regular rhythm and normal heart sounds.  No murmur heard. Pulmonary/Chest: Effort normal. No respiratory distress. She has no wheezes. She has no rales. She exhibits no tenderness.  Abdominal: Soft. She exhibits no distension. There is no abdominal tenderness.  Musculoskeletal: Normal range of motion.        General: No edema.  Lymphadenopathy:    She has no cervical adenopathy.  Neurological: She is alert and oriented to person, place, and time. No cranial nerve deficit. She exhibits normal muscle tone. Gait normal. Coordination normal.  Skin: Skin is warm and dry. She is not diaphoretic. No erythema.  Psychiatric: Affect and judgment normal.      LABORATORY DATA: I have personally reviewed the data as listed: CBC Latest Ref Rng & Units 06/02/2019 03/10/2019 11/17/2018  WBC 4.0 - 10.5 K/uL 10.1 10.7(H) 10.3  Hemoglobin 12.0 - 15.0 g/dL 12.4 12.6 13.6  Hematocrit 36.0 - 46.0 % 38.3 38.7 42.3  Platelets 150 - 400 K/uL 347 376 333   CMP Latest Ref Rng & Units 06/02/2019 03/10/2019 11/17/2018  Glucose 70 - 99 mg/dL 88 89 93  BUN 8 - 23 mg/dL '15 12 14  ' Creatinine 0.44 - 1.00 mg/dL 0.62 0.74 0.76  Sodium 135 - 145 mmol/L  136 136 136  Potassium 3.5 - 5.1 mmol/L 3.6 4.2 4.3  Chloride 98 - 111 mmol/L 102 101 101  CO2 22 - 32 mmol/L '25 28 26  ' Calcium 8.9 - 10.3 mg/dL 8.8(L) 8.6(L) 8.9  Total Protein 6.5 - 8.1 g/dL 7.6 7.6 8.0  Total Bilirubin 0.3 - 1.2 mg/dL 0.4 0.4 0.4  Alkaline Phos 38 - 126 U/L 66 80 87  AST 15 - 41 U/L 14(L) 13(L) 22  ALT 0 - 44 U/L '10 10 21   ' RADIOGRAPHIC STUDIES: I have personally reviewed the radiological images as listed and agreed with the findings in the report.    ASSESSMENT/PLAN 73yo female who has MSI  stable stage IIIB Colon Cancer presents for follow up  Cancer Staging Malignant neoplasm of transverse colon [facility]) Staging form: Colon and Rectum, AJCC 8th Edition - Clinical stage from 04/15/2017: Stage IIIB (cT4a, cN1b, cM0) - Signed by Earlie Server, MD on 04/26/2017  1. Malignant neoplasm of transverse colon ([place])   2. Personal history of malignant neoplasm of breast   3. Elevated CEA   4. Adrenal adenoma, unspecified laterality    #History of stage III colon cancer -03/2017 Clinically doing well. Labs reviewed and discussed with patient. CEA gradually rises,   Most recent level at a 7.8. CT 03/03/2019 showed no evidence of metastasis. Her 1 year colonoscopy after surgery was done on 04/09/2018.  Preparation of the colon was fair.  It was recommended that patient had a repeat colonoscopy 6 to 12 months after her colostomy reversal. Will refer patient back to Dr. Bonna Gains. We will also obtain repeat surveillance CT in 3 months.  # History of breast Cancer, annual bilateral screening mammogram was done on 12/07/2018.  No mammographic evidence of malignancy. # Adrenal adenoma, stable. CT surveillance.   Orders Placed This Encounter  Procedures  . CBC with Differential/Platelet    Standing Status:   Future    Standing Expiration Date:   06/01/2020  . Comprehensive metabolic panel    Standing Status:   Future    Standing Expiration Date:  06/01/2020  . CEA    Standing Status:    Future    Standing Expiration Date:   06/01/2020   follow up in 3 months or earlier if needed.   Earlie Server, MD, PhD Hematology Oncology [facility] Pager- [phone] 06/03/19

## 2019-06-03 NOTE — Chronic Care Management (AMB) (Signed)
Chronic Care Management   Initial Visit Note  06/03/2019 Name: Kimberly [name] MRN: [id number] DOB: [birth year]  Subjective Kimberly [name] is a 73 y.o. year old female who is a primary care patient of Fisher, Kirstie Peri, MD. The CCM clinical pharmacist was consulted by [name] via internal referral. Initial pharmacy outreach via telephone, HIPAA identifiers verified.  Assessment Review of patient status, including review of consultants reports, relevant laboratory and other test results, and collaboration with appropriate care team members and the patient's provider was performed as part of comprehensive patient evaluation and provision of chronic care management services.    Patient reports home blood pressures as the following:  156/76 148/77 161/80 160/80 166/82 160/80  SDOH (Social Determinants of Health) screening performed today: None. See Care Plan for related entries.   Medications: Outpatient Encounter Medications as of 06/03/2019  Medication Sig Note  . cholecalciferol (VITAMIN D) 1000 units tablet Take 1,000 Units by mouth daily.   Marland Kitchen docusate sodium (COLACE) 100 MG capsule Take 100 mg by mouth daily.   Marland Kitchen gabapentin (NEURONTIN) 400 MG capsule Take 1 capsule (400 mg total) by mouth 3 (three) times daily. 05/18/2019: Patient take 1 tab daily  . HYDROcodone-acetaminophen (NORCO) 7.5-325 MG tablet Take 1-2 tablets by mouth every 6 (six) hours as needed for moderate pain. 06/03/2019: Every morning  . meloxicam (MOBIC) 15 MG tablet TAKE 1 TABLET EVERY DAY AS NEEDED   . montelukast (SINGULAIR) 10 MG tablet TAKE 1 TABLET BY MOUTH EVERY DAY   . omeprazole (PRILOSEC) 20 MG capsule Take 1 capsule (20 mg total) by mouth daily.   . pravastatin (PRAVACHOL) 40 MG tablet TAKE 1 TABLET EVERY DAY   . Probiotic Product (PROBIOTIC DAILY PO) Take 1 capsule by mouth daily.    . Triamcinolone Acetonide (NASACORT ALLERGY 24HR NA) Place 2 sprays into the nose daily as needed (allergies).    .  budesonide-formoterol (SYMBICORT) 80-4.5 MCG/ACT inhaler Inhale 2 puffs into the lungs 2 (two) times daily.   Marland Kitchen ibuprofen (ADVIL,MOTRIN) 200 MG tablet Take 400 mg by mouth daily as needed for headache or moderate pain. 06/03/2019: Recommended against ibuprofen + meloxicam, APAP instead; no more than 3 500mg  per day   . ondansetron (ZOFRAN) 4 MG tablet Take 0.5-1 tablets (2-4 mg total) by mouth every 8 (eight) hours as needed for nausea or vomiting. (Patient not taking: Reported on 06/03/2019)   . [DISCONTINUED] pyridOXINE (VITAMIN B-6) 50 MG tablet Take 1 tablet (50 mg total) by mouth daily.    No facility-administered encounter medications on file as of 06/03/2019.      Objective:  Lab Results  Component Value Date   CREATININE 0.62 06/02/2019   CREATININE 0.74 03/10/2019   CREATININE 0.76 11/17/2018     Chemistry      Component Value Date/Time   NA 136 06/02/2019 1326   NA 141 01/02/2017 1410   NA 136 01/27/2012 0637   K 3.6 06/02/2019 1326   K 4.3 01/27/2012 0637   CL 102 06/02/2019 1326   CL 102 01/27/2012 0637   CO2 25 06/02/2019 1326   CO2 25 01/27/2012 0637   BUN 15 06/02/2019 1326   BUN 14 01/02/2017 1410   BUN 15 01/27/2012 0637   CREATININE 0.62 06/02/2019 1326   CREATININE 0.67 01/27/2012 0637      Component Value Date/Time   CALCIUM 8.8 (L) 06/02/2019 1326   CALCIUM 7.7 (L) 01/27/2012 0637   ALKPHOS 66 06/02/2019 1326   ALKPHOS 87  01/26/2012 0021   AST 14 (L) 06/02/2019 1326   AST 17 01/26/2012 0021   ALT 10 06/02/2019 1326   ALT 19 01/26/2012 0021   BILITOT 0.4 06/02/2019 1326   BILITOT <0.2 01/02/2017 1410   BILITOT 0.2 01/26/2012 0021       Goals Addressed            This Visit's Progress   . Blood pressure mangagement (pt-stated)       Current Barriers:  Marland Kitchen Knowledge Deficits related to basic understanding of hypertension pathophysiology and self care management . Uncontrolled high blood pressure  Clinical Pharmacist Goal(s):  Marland Kitchen Over the next 30  days, patient will verbalize understanding of plan for hypertension management . Over the next 30 days, patient will demonstrate improved health management independence as evidenced by checking blood pressure as directed and notifying PCP if SBP>180 or DBP > 90, taking all medications as prescribe, and adhering to a low sodium diet as discussed. . Over the next 30 days, patient will verbalize basic understanding of hypertension disease process and self health management plan as evidenced by patient report  Interventions:  . Evaluation of current treatment plan related to hypertension self management and patient's adherence to plan as established by provider. . Provided education to patient re: stroke prevention, s/s of heart attack and stroke, DASH diet, complications of uncontrolled blood pressure . Collaboration with Dr. Caryn Section regarding medication management of high blood pressure  Patient Self Care Activities:  . Self administers medications as prescribed . Attends all scheduled provider appointments . Calls provider office for new concerns, questions, or BP outside discussed parameters . Checks BP and records as discussed . Follows a low sodium diet/DASH diet  Initial goal documentation      . COMPLETED: I really need a blood pressure cuff (pt-stated)       Current Barriers:  Marland Kitchen Knowledge Deficits related to [facility] medicare OTC benefits  Nurse Case Manager Clinical Goal(s):  Marland Kitchen Over the next 30 days, patient will utilize [employer] OTC benefits to purchase a BP cuff and other needed over the counter items  Interventions:  . Provided patient with [employer] medicare OTC benefit web address and walked patient through ordering BP cuff through her My [employer] account. . Provided patient with telephone number for [employer] OTC Benefit for requesting catalogue  Patient Self Care Activities:  . Self administers medications as prescribed . Attends all scheduled provider appointments . Calls  pharmacy for medication refills . Attends church or other social activities . Performs ADL's independently . Performs IADL's independently . Calls provider office for new concerns or questions  Initial goal documentation        Plan:   Recommendations discussed with provider:  -lisinopril 5mg  daily   Recommendations discussed with patient:  - DASH diet - Call provider if "top number" is over 180 mm Hg - hydration    Follow up: Telephone follow up appointment with care management team member scheduled for: 7-10 days with PharmD  Ruben Reason, PharmD Clinical Pharmacist [place] [phone]

## 2019-06-04 ENCOUNTER — Other Ambulatory Visit: Payer: Self-pay

## 2019-06-04 ENCOUNTER — Telehealth: Payer: Self-pay

## 2019-06-04 DIAGNOSIS — C184 Malignant neoplasm of transverse colon: Secondary | ICD-10-CM

## 2019-06-04 DIAGNOSIS — Z853 Personal history of malignant neoplasm of breast: Secondary | ICD-10-CM

## 2019-06-04 NOTE — Telephone Encounter (Signed)
Patient informed.  I will put in a CT order and send a scheduling pool message.

## 2019-06-04 NOTE — Telephone Encounter (Signed)
-----   Message from Earlie Server, MD sent at 06/04/2019  8:30 AM EDT -----  ----- Message ----- From: Earlie Server, MD Sent: 06/03/2019   9:25 PM EDT To: Vernetta Honey, CMA, Virgel Manifold, MD  Almyra Free, please inform patient that I recommend her to follow up with Dr.Tahiliani for a repeat colonoscopy.  Cc Dr.Tahiliani.   Also please schedule her to have CT abdomen pelvis w contrast done prior to next visit thanks.

## 2019-06-09 ENCOUNTER — Other Ambulatory Visit: Payer: Self-pay | Admitting: Oncology

## 2019-06-09 ENCOUNTER — Telehealth: Payer: Self-pay

## 2019-06-09 DIAGNOSIS — C184 Malignant neoplasm of transverse colon: Secondary | ICD-10-CM

## 2019-06-09 DIAGNOSIS — R970 Elevated carcinoembryonic antigen [CEA]: Secondary | ICD-10-CM

## 2019-06-09 NOTE — Telephone Encounter (Signed)
Called patient to let her know about her CT Scan w/o contrast. Patient agreed and had no further questions.

## 2019-06-09 NOTE — Telephone Encounter (Signed)
Error

## 2019-06-10 ENCOUNTER — Ambulatory Visit (INDEPENDENT_AMBULATORY_CARE_PROVIDER_SITE_OTHER): Payer: Medicare HMO | Admitting: Pharmacist

## 2019-06-10 DIAGNOSIS — I2510 Atherosclerotic heart disease of native coronary artery without angina pectoris: Secondary | ICD-10-CM | POA: Diagnosis not present

## 2019-06-10 DIAGNOSIS — I4891 Unspecified atrial fibrillation: Secondary | ICD-10-CM

## 2019-06-11 NOTE — Chronic Care Management (AMB) (Signed)
  Chronic Care Management   Care Coordination Note  06/11/2019 Name: Kimberly [name] MRN: [id number] DOB: 03/13/1946  Care Coordination-   Follow up on lisinopril 5mg  daily recommendation with Dr. Lelon Huh 2/2 to patient's persistent elevated home BP readings  Follow up plan: Follow up with provider re: lisinopril 5mg  daily  Ruben Reason, PharmD Clinical Pharmacist [place] [phone]

## 2019-06-14 DIAGNOSIS — J441 Chronic obstructive pulmonary disease with (acute) exacerbation: Secondary | ICD-10-CM | POA: Diagnosis not present

## 2019-06-14 DIAGNOSIS — R062 Wheezing: Secondary | ICD-10-CM | POA: Diagnosis not present

## 2019-06-14 DIAGNOSIS — S72046A Nondisplaced fracture of base of neck of unspecified femur, initial encounter for closed fracture: Secondary | ICD-10-CM | POA: Diagnosis not present

## 2019-06-17 ENCOUNTER — Other Ambulatory Visit: Payer: Self-pay | Admitting: Family Medicine

## 2019-06-17 DIAGNOSIS — M5136 Other intervertebral disc degeneration, lumbar region: Secondary | ICD-10-CM

## 2019-06-17 MED ORDER — HYDROCODONE-ACETAMINOPHEN 7.5-325 MG PO TABS: 1.0000 | ORAL_TABLET | Freq: Four times a day (QID) | ORAL | 0 refills | Status: DC | PRN

## 2019-06-21 ENCOUNTER — Other Ambulatory Visit: Payer: Self-pay

## 2019-06-21 ENCOUNTER — Ambulatory Visit
Admission: RE | Admit: 2019-06-21 | Discharge: 2019-06-21 | Disposition: A | Discharge status: Home / Self Care | Payer: Medicare HMO | Source: Ambulatory Visit | Attending: Oncology | Admitting: Oncology

## 2019-06-21 DIAGNOSIS — C184 Malignant neoplasm of transverse colon: Secondary | ICD-10-CM | POA: Diagnosis not present

## 2019-06-21 DIAGNOSIS — R970 Elevated carcinoembryonic antigen [CEA]: Secondary | ICD-10-CM | POA: Diagnosis not present

## 2019-06-21 DIAGNOSIS — C189 Malignant neoplasm of colon, unspecified: Secondary | ICD-10-CM | POA: Diagnosis not present

## 2019-06-22 ENCOUNTER — Ambulatory Visit
Admission: RE | Admit: 2019-06-22 | Discharge: 2019-06-22 | Disposition: A | Discharge status: Home / Self Care | Payer: Medicare HMO | Source: Ambulatory Visit | Attending: Family Medicine | Admitting: Family Medicine

## 2019-06-22 DIAGNOSIS — M810 Age-related osteoporosis without current pathological fracture: Secondary | ICD-10-CM | POA: Insufficient documentation

## 2019-06-22 DIAGNOSIS — Z780 Asymptomatic menopausal state: Secondary | ICD-10-CM | POA: Diagnosis not present

## 2019-06-22 DIAGNOSIS — M8589 Other specified disorders of bone density and structure, multiple sites: Secondary | ICD-10-CM | POA: Diagnosis not present

## 2019-06-25 ENCOUNTER — Telehealth: Payer: Self-pay

## 2019-06-25 NOTE — Telephone Encounter (Signed)
Patient has been advised. KW 

## 2019-06-25 NOTE — Telephone Encounter (Signed)
-----   Message from Birdie Sons, MD sent at 06/24/2019  4:32 PM EDT ----- Bone density test shows osteopenia, but is improved since last test in 2017. Continue vitamin d supplements. Exercise every day including some weight bearing exercises. Repeat BMD in 2023

## 2019-06-26 ENCOUNTER — Other Ambulatory Visit: Payer: Self-pay | Admitting: Internal Medicine

## 2019-07-06 ENCOUNTER — Other Ambulatory Visit: Payer: Self-pay

## 2019-07-06 ENCOUNTER — Ambulatory Visit (INDEPENDENT_AMBULATORY_CARE_PROVIDER_SITE_OTHER): Payer: Medicare HMO

## 2019-07-06 DIAGNOSIS — Z23 Encounter for immunization: Secondary | ICD-10-CM | POA: Diagnosis not present

## 2019-07-14 DIAGNOSIS — S72046A Nondisplaced fracture of base of neck of unspecified femur, initial encounter for closed fracture: Secondary | ICD-10-CM | POA: Diagnosis not present

## 2019-07-14 DIAGNOSIS — J441 Chronic obstructive pulmonary disease with (acute) exacerbation: Secondary | ICD-10-CM | POA: Diagnosis not present

## 2019-07-14 DIAGNOSIS — R062 Wheezing: Secondary | ICD-10-CM | POA: Diagnosis not present

## 2019-07-26 ENCOUNTER — Other Ambulatory Visit: Payer: Self-pay | Admitting: Family Medicine

## 2019-07-26 DIAGNOSIS — M5136 Other intervertebral disc degeneration, lumbar region: Secondary | ICD-10-CM

## 2019-07-26 MED ORDER — HYDROCODONE-ACETAMINOPHEN 7.5-325 MG PO TABS: 1.0000 | ORAL_TABLET | Freq: Four times a day (QID) | ORAL | 0 refills | Status: DC | PRN

## 2019-07-29 ENCOUNTER — Encounter: Payer: Self-pay | Admitting: Gastroenterology

## 2019-07-29 ENCOUNTER — Ambulatory Visit: Payer: Medicare HMO | Admitting: Gastroenterology

## 2019-08-09 ENCOUNTER — Other Ambulatory Visit: Payer: Self-pay

## 2019-08-09 ENCOUNTER — Encounter: Payer: Self-pay | Admitting: Gastroenterology

## 2019-08-09 ENCOUNTER — Ambulatory Visit (INDEPENDENT_AMBULATORY_CARE_PROVIDER_SITE_OTHER): Payer: Medicare HMO | Admitting: Gastroenterology

## 2019-08-09 VITALS — BP 151/80 | HR 98 | Temp 98.1°F | Ht 65.0 in | Wt 161.4 lb

## 2019-08-09 DIAGNOSIS — Z85038 Personal history of other malignant neoplasm of large intestine: Secondary | ICD-10-CM

## 2019-08-09 MED ORDER — BISACODYL EC 5 MG PO TBEC: DELAYED_RELEASE_TABLET | ORAL | 0 refills | Status: DC

## 2019-08-09 MED ORDER — NA SULFATE-K SULFATE-MG SULF 17.5-3.13-1.6 GM/177ML PO SOLN: 354.0000 mL | Freq: Once | ORAL | 0 refills | Status: AC

## 2019-08-09 NOTE — Progress Notes (Signed)
Kimberly Antigua, MD [address]  [place]  [place], [place] [address]  Main: [phone]  Fax: [phone]   Primary Care Physician: Birdie Sons, MD   Chief Complaint  Patient presents with  . New Patient (Initial Visit)  . Constipation    Patient states she has to take a stool softener everyday     HPI: Kimberly [name] is a 73 y.o. female with history of colon cancer here for follow-up.  Patient is status post colostomy reversal last year and doing well post surgery.  No blood in stool.  Reports soft bowel movements daily.  No nausea or vomiting.  No weight loss.  No abdominal pain.  Current Outpatient Medications  Medication Sig Dispense Refill  . budesonide-formoterol (SYMBICORT) 80-4.5 MCG/ACT inhaler Inhale 2 puffs into the lungs 2 (two) times daily. 3 Inhaler 3  . cholecalciferol (VITAMIN D) 1000 units tablet Take 1,000 Units by mouth daily.    Marland Kitchen docusate sodium (COLACE) 100 MG capsule Take 100 mg by mouth daily.    Marland Kitchen gabapentin (NEURONTIN) 400 MG capsule Take 1 capsule (400 mg total) by mouth 3 (three) times daily. 270 capsule 1  . HYDROcodone-acetaminophen (NORCO) 7.5-325 MG tablet Take 1-2 tablets by mouth every 6 (six) hours as needed for moderate pain. 120 tablet 0  . ibuprofen (ADVIL,MOTRIN) 200 MG tablet Take 400 mg by mouth daily as needed for headache or moderate pain.    . meloxicam (MOBIC) 15 MG tablet TAKE 1 TABLET EVERY DAY AS NEEDED 90 tablet 3  . montelukast (SINGULAIR) 10 MG tablet TAKE 1 TABLET BY MOUTH EVERY DAY 90 tablet 0  . omeprazole (PRILOSEC) 20 MG capsule Take 1 capsule (20 mg total) by mouth daily. 90 capsule 4  . ondansetron (ZOFRAN) 4 MG tablet Take 0.5-1 tablets (2-4 mg total) by mouth every 8 (eight) hours as needed for nausea or vomiting. 20 tablet 0  . pravastatin (PRAVACHOL) 40 MG tablet TAKE 1 TABLET EVERY DAY 90 tablet 4  . Probiotic Product (PROBIOTIC DAILY PO) Take 1 capsule by mouth daily.     . Triamcinolone Acetonide  (NASACORT ALLERGY 24HR NA) Place 2 sprays into the nose daily as needed (allergies).     . bisacodyl (BISACODYL) 5 MG EC tablet Take 2 tablets (10mg ) by mouth the day before your procedure between 1pm-3pm. 2 tablet 0  . Na Sulfate-K Sulfate-Mg Sulf 17.5-3.13-1.6 GM/177ML SOLN Take 354 mLs by mouth once for 1 dose. 354 mL 0   No current facility-administered medications for this visit.     Allergies as of 08/09/2019 - Review Complete 08/09/2019  Allergen Reaction Noted  . Betadine [povidone iodine] Other (See Comments) 12/02/2017  . Iodine Other (See Comments) 04/13/2015  . Other Nausea And Vomiting 05/15/2017  . Sinus formula [cholestatin] Nausea Only 04/13/2015    ROS:  General: Negative for anorexia, weight loss, fever, chills, fatigue, weakness. ENT: Negative for hoarseness, difficulty swallowing , nasal congestion. CV: Negative for chest pain, angina, palpitations, dyspnea on exertion, peripheral edema.  Respiratory: Negative for dyspnea at rest, dyspnea on exertion, cough, sputum, wheezing.  GI: See history of present illness. GU:  Negative for dysuria, hematuria, urinary incontinence, urinary frequency, nocturnal urination.  Endo: Negative for unusual weight change.    Physical Examination:   BP (!) 151/80 (BP Location: Left Arm, Patient Position: Sitting, Cuff Size: Normal)   Pulse 98   Temp 98.1 F (36.7 C) (Oral)   Ht 5\' 5"  (1.651 m)  Wt 161 lb 6 oz (73.2 kg)   BMI 26.85 kg/m   General: Well-nourished, well-developed in no acute distress.  Eyes: No icterus. Conjunctivae pink. Mouth: Oropharyngeal mucosa moist and pink , no lesions erythema or exudate. Neck: Supple, Trachea midline Abdomen: Bowel sounds are normal, nontender, nondistended, no hepatosplenomegaly or masses, no abdominal bruits or hernia , no rebound or guarding.   Extremities: No lower extremity edema. No clubbing or deformities. Neuro: Alert and oriented x 3.  Grossly intact. Skin: Warm and dry, no  jaundice.   Psych: Alert and cooperative, normal mood and affect.   Labs: CMP     Component Value Date/Time   NA 136 06/02/2019 1326   NA 141 01/02/2017 1410   NA 136 01/27/2012 0637   K 3.6 06/02/2019 1326   K 4.3 01/27/2012 0637   CL 102 06/02/2019 1326   CL 102 01/27/2012 0637   CO2 25 06/02/2019 1326   CO2 25 01/27/2012 0637   GLUCOSE 88 06/02/2019 1326   GLUCOSE 119 (H) 01/27/2012 0637   BUN 15 06/02/2019 1326   BUN 14 01/02/2017 1410   BUN 15 01/27/2012 0637   CREATININE 0.62 06/02/2019 1326   CREATININE 0.67 01/27/2012 0637   CALCIUM 8.8 (L) 06/02/2019 1326   CALCIUM 7.7 (L) 01/27/2012 0637   PROT 7.6 06/02/2019 1326   PROT 7.0 01/02/2017 1410   PROT 7.6 01/26/2012 0021   ALBUMIN 3.7 06/02/2019 1326   ALBUMIN 3.9 01/02/2017 1410   ALBUMIN 3.5 01/26/2012 0021   AST 14 (L) 06/02/2019 1326   AST 17 01/26/2012 0021   ALT 10 06/02/2019 1326   ALT 19 01/26/2012 0021   ALKPHOS 66 06/02/2019 1326   ALKPHOS 87 01/26/2012 0021   BILITOT 0.4 06/02/2019 1326   BILITOT <0.2 01/02/2017 1410   BILITOT 0.2 01/26/2012 0021   GFRNONAA >60 06/02/2019 1326   GFRNONAA >60 01/27/2012 0637   GFRAA >60 06/02/2019 1326   GFRAA >60 01/27/2012 0637   Lab Results  Component Value Date   WBC 10.1 06/02/2019   HGB 12.4 06/02/2019   HCT 38.3 06/02/2019   MCV 85.3 06/02/2019   PLT 347 06/02/2019    Imaging Studies: No results found.  Assessment and Plan:   LAMARI PILKENTON is a 73 y.o. y/o female history of colon cancer, status post resection, now status post colostomy reversal last year, here to discuss colonoscopy  No contraindications to proceeding with colonoscopy for history of colon cancer surveillance  I have discussed alternative options, risks & benefits,  which include, but are not limited to, bleeding, infection, perforation,respiratory complication & drug reaction.  The patient agrees with this plan & written consent will be obtained.       Dr Kimberly [name]

## 2019-08-10 ENCOUNTER — Other Ambulatory Visit
Admission: RE | Admit: 2019-08-10 | Discharge: 2019-08-10 | Disposition: A | Discharge status: Home / Self Care | Payer: Medicare HMO | Source: Ambulatory Visit | Attending: Gastroenterology | Admitting: Gastroenterology

## 2019-08-10 ENCOUNTER — Encounter: Payer: Self-pay | Admitting: *Deleted

## 2019-08-10 DIAGNOSIS — Z20828 Contact with and (suspected) exposure to other viral communicable diseases: Secondary | ICD-10-CM | POA: Diagnosis not present

## 2019-08-10 DIAGNOSIS — Z01812 Encounter for preprocedural laboratory examination: Secondary | ICD-10-CM | POA: Diagnosis not present

## 2019-08-10 LAB — SARS CORONAVIRUS 2 (TAT 6-24 HRS): SARS Coronavirus 2: NEGATIVE

## 2019-08-12 NOTE — Discharge Instructions (Signed)

## 2019-08-13 ENCOUNTER — Ambulatory Visit: Payer: Medicare HMO | Admitting: Anesthesiology

## 2019-08-13 ENCOUNTER — Other Ambulatory Visit: Payer: Self-pay

## 2019-08-13 ENCOUNTER — Encounter
Admission: RE | Disposition: A | Discharge status: Home / Self Care | Payer: Self-pay | Source: Home / Self Care | Attending: Gastroenterology

## 2019-08-13 ENCOUNTER — Ambulatory Visit
Admission: RE | Admit: 2019-08-13 | Discharge: 2019-08-13 | Disposition: A | Discharge status: Home / Self Care | Payer: Medicare HMO | Attending: Gastroenterology | Admitting: Gastroenterology

## 2019-08-13 DIAGNOSIS — Z1211 Encounter for screening for malignant neoplasm of colon: Secondary | ICD-10-CM | POA: Insufficient documentation

## 2019-08-13 DIAGNOSIS — J449 Chronic obstructive pulmonary disease, unspecified: Secondary | ICD-10-CM | POA: Insufficient documentation

## 2019-08-13 DIAGNOSIS — Z79899 Other long term (current) drug therapy: Secondary | ICD-10-CM | POA: Diagnosis not present

## 2019-08-13 DIAGNOSIS — Z980 Intestinal bypass and anastomosis status: Secondary | ICD-10-CM | POA: Insufficient documentation

## 2019-08-13 DIAGNOSIS — Z888 Allergy status to other drugs, medicaments and biological substances status: Secondary | ICD-10-CM | POA: Insufficient documentation

## 2019-08-13 DIAGNOSIS — Z96642 Presence of left artificial hip joint: Secondary | ICD-10-CM | POA: Insufficient documentation

## 2019-08-13 DIAGNOSIS — I2510 Atherosclerotic heart disease of native coronary artery without angina pectoris: Secondary | ICD-10-CM | POA: Diagnosis not present

## 2019-08-13 DIAGNOSIS — Z87891 Personal history of nicotine dependence: Secondary | ICD-10-CM | POA: Diagnosis not present

## 2019-08-13 DIAGNOSIS — Z9221 Personal history of antineoplastic chemotherapy: Secondary | ICD-10-CM | POA: Diagnosis not present

## 2019-08-13 DIAGNOSIS — K6389 Other specified diseases of intestine: Secondary | ICD-10-CM | POA: Diagnosis not present

## 2019-08-13 DIAGNOSIS — K635 Polyp of colon: Secondary | ICD-10-CM | POA: Diagnosis not present

## 2019-08-13 DIAGNOSIS — Z7951 Long term (current) use of inhaled steroids: Secondary | ICD-10-CM | POA: Insufficient documentation

## 2019-08-13 DIAGNOSIS — Z853 Personal history of malignant neoplasm of breast: Secondary | ICD-10-CM | POA: Insufficient documentation

## 2019-08-13 DIAGNOSIS — K219 Gastro-esophageal reflux disease without esophagitis: Secondary | ICD-10-CM | POA: Diagnosis not present

## 2019-08-13 DIAGNOSIS — Z85038 Personal history of other malignant neoplasm of large intestine: Secondary | ICD-10-CM | POA: Diagnosis not present

## 2019-08-13 DIAGNOSIS — Z923 Personal history of irradiation: Secondary | ICD-10-CM | POA: Diagnosis not present

## 2019-08-13 HISTORY — PX: COLONOSCOPY WITH PROPOFOL: SHX5780

## 2019-08-13 HISTORY — DX: Presence of dental prosthetic device (complete) (partial): Z97.2

## 2019-08-13 SURGERY — COLONOSCOPY WITH PROPOFOL
Anesthesia: General

## 2019-08-13 MED ORDER — STERILE WATER FOR IRRIGATION IR SOLN
Status: DC | PRN
  Administered 2019-08-13: 09:00:00

## 2019-08-13 MED ORDER — PROPOFOL 10 MG/ML IV BOLUS
INTRAVENOUS | Status: DC | PRN
  Administered 2019-08-13: 30 mg via INTRAVENOUS
  Administered 2019-08-13: 120 mg via INTRAVENOUS
  Administered 2019-08-13: 30 mg via INTRAVENOUS
  Administered 2019-08-13: 40 mg via INTRAVENOUS
  Administered 2019-08-13: 30 mg via INTRAVENOUS
  Administered 2019-08-13: 40 mg via INTRAVENOUS
  Administered 2019-08-13: 20 mg via INTRAVENOUS
  Administered 2019-08-13: 30 mg via INTRAVENOUS

## 2019-08-13 MED ORDER — LIDOCAINE HCL (CARDIAC) PF 100 MG/5ML IV SOSY
PREFILLED_SYRINGE | INTRAVENOUS | Status: DC | PRN
  Administered 2019-08-13: 40 mg via INTRAVENOUS

## 2019-08-13 MED ORDER — LACTATED RINGERS IV SOLN
10.0000 mL/h | INTRAVENOUS | Status: DC
  Administered 2019-08-13: 08:00:00 10 mL/h via INTRAVENOUS

## 2019-08-13 MED ORDER — ACETAMINOPHEN 160 MG/5ML PO SOLN: 325.0000 mg | Freq: Once | ORAL | Status: DC

## 2019-08-13 MED ORDER — ACETAMINOPHEN 325 MG PO TABS: 325.0000 mg | ORAL_TABLET | Freq: Once | ORAL | Status: DC

## 2019-08-13 SURGICAL SUPPLY — 24 items
CANISTER SUCT 1200ML W/VALVE (MISCELLANEOUS) ×3 IMPLANT
CLIP HMST 235XBRD CATH ROT (MISCELLANEOUS) IMPLANT
CLIP RESOLUTION 360 11X235 (MISCELLANEOUS)
ELECT REM PT RETURN 9FT ADLT (ELECTROSURGICAL)
ELECTRODE REM PT RTRN 9FT ADLT (ELECTROSURGICAL) IMPLANT
FCP ESCP3.2XJMB 240X2.8X (MISCELLANEOUS)
FORCEPS BIOP RAD 4 LRG CAP 4 (CUTTING FORCEPS) ×3 IMPLANT
FORCEPS BIOP RJ4 240 W/NDL (MISCELLANEOUS)
FORCEPS ESCP3.2XJMB 240X2.8X (MISCELLANEOUS) IMPLANT
GOWN CVR UNV OPN BCK APRN NK (MISCELLANEOUS) ×2 IMPLANT
GOWN ISOL THUMB LOOP REG UNIV (MISCELLANEOUS) ×4
INJECTOR VARIJECT VIN23 (MISCELLANEOUS) IMPLANT
KIT DEFENDO VALVE AND CONN (KITS) IMPLANT
KIT ENDO PROCEDURE OLY (KITS) ×3 IMPLANT
MARKER SPOT ENDO TATTOO 5ML (MISCELLANEOUS) IMPLANT
PROBE APC STR FIRE (PROBE) IMPLANT
RETRIEVER NET ROTH 2.5X230 LF (MISCELLANEOUS) IMPLANT
SNARE SHORT THROW 13M SML OVAL (MISCELLANEOUS) IMPLANT
SNARE SHORT THROW 30M LRG OVAL (MISCELLANEOUS) IMPLANT
SNARE SNG USE RND 15MM (INSTRUMENTS) IMPLANT
SPOT EX ENDOSCOPIC TATTOO (MISCELLANEOUS)
TRAP ETRAP POLY (MISCELLANEOUS) IMPLANT
VARIJECT INJECTOR VIN23 (MISCELLANEOUS)
WATER STERILE IRR 250ML POUR (IV SOLUTION) ×3 IMPLANT

## 2019-08-13 NOTE — Anesthesia Procedure Notes (Signed)
Date/Time: 08/13/2019 9:11 AM Performed by: Cameron Ali, CRNA Pre-anesthesia Checklist: Patient identified, Emergency Drugs available, Suction available, Timeout performed and Patient being monitored Patient Re-evaluated:Patient Re-evaluated prior to induction Oxygen Delivery Method: Nasal cannula Placement Confirmation: positive ETCO2

## 2019-08-13 NOTE — Op Note (Signed)
[facility] Gastroenterology Patient Name: Demariyah Pizzo Procedure Date: 08/13/2019 9:03 AM MRN: [id number] Account #: [id number] Date of Birth: 11-11-1945 Admit Type: Outpatient Age: 73 Room: [facility] OR ROOM 01 Gender: Female Note Status: Finalized Procedure:             Colonoscopy Indications:           High risk colon cancer surveillance: Personal history                         of colon cancer Providers:             Celsey Asselin B. Bonna Gains MD, MD Referring MD:          Kirstie Peri. Caryn Section, MD (Referring MD) Medicines:             Monitored Anesthesia Care Complications:         No immediate complications. Procedure:             Pre-Anesthesia Assessment:                        - Prior to the procedure, a History and Physical was                         performed, and patient medications, allergies and                         sensitivities were reviewed. The patient's tolerance                         of previous anesthesia was reviewed.                        - The risks and benefits of the procedure and the                         sedation options and risks were discussed with the                         patient. All questions were answered and informed                         consent was obtained.                        - Patient identification and proposed procedure were                         verified prior to the procedure by the physician, the                         nurse, the anesthetist and the technician. The                         procedure was verified in the pre-procedure area in                         the procedure room in the endoscopy suite.                        -  ASA Grade Assessment: III - A patient with severe                         systemic disease.                        - After reviewing the risks and benefits, the patient                         was deemed in satisfactory condition to undergo the                         procedure.                 After obtaining informed consent, the colonoscope was                         passed under direct vision. Throughout the procedure,                         the patient's blood pressure, pulse, and oxygen                         saturations were monitored continuously. The                         Colonoscope was introduced through the anus and                         advanced to the the ileocolonic anastomosis. The                         colonoscopy was performed with ease. The patient                         tolerated the procedure well. The quality of the bowel                         preparation was fair. Findings:      The perianal and digital rectal examinations were normal.      There was evidence of a prior surgical anastomosis in the entire colon.       This was patent and was characterized by healthy appearing mucosa.      A localized area of mildly thickened folds of the mucosa was found in       the sigmoid colon. Biopsies were taken with a cold forceps for histology.      The ileum, colon (entire examined portion) and anastomosis appeared       normal.      The retroflexed view of the distal rectum and anal verge was normal and       showed no anal or rectal abnormalities. Impression:            - Preparation of the colon was fair.                        - Patent surgical anastomosis, characterized by                         healthy appearing mucosa.                        -  Thickened folds of the mucosa in the sigmoid colon.                         Biopsied.                        - The terminal ileum, entire examined colon and                         colonic anastomosis are normal.                        - The distal rectum and anal verge are normal on                         retroflexion view. Recommendation:        - Discharge patient to home.                        - Resume previous diet.                        - Continue present medications.                         - Repeat colonoscopy in 1 year (pt only drank of her                         second prep bottle) for surveillance.                        - Return to primary care physician as previously                         scheduled.                        - The findings and recommendations were discussed with                         the patient.                        - The findings and recommendations were discussed with                         the patient's family. Procedure Code(s):     --- Professional ---                        [id number], Colonoscopy, flexible; with biopsy, single or                         multiple Diagnosis Code(s):     --- Professional ---                        [id number], Personal history of other malignant neoplasm                         of large intestine  Z98.0, Intestinal bypass and anastomosis status                        K63.89, Other specified diseases of intestine CPT copyright 2019 American Medical Association. All rights reserved. The codes documented in this report are preliminary and upon coder review may  be revised to meet current compliance requirements.  Vonda Antigua, MD Margretta Sidle B. Bonna Gains MD, MD 08/13/2019 9:48:42 AM This report has been signed electronically. Number of Addenda: 0 Note Initiated On: 08/13/2019 9:03 AM Scope Withdrawal Time: 0 hours 18 minutes 36 seconds  Total Procedure Duration: 0 hours 25 minutes 53 seconds  Estimated Blood Loss:  Estimated blood loss: none.      [facility]

## 2019-08-13 NOTE — H&P (Signed)
Kimberly Antigua, MD [address] [place], [place], [place], [address] [address], [place], [place], [place], [address] Phone: [phone]  Fax: [phone]  Primary Care Physician:  Birdie Sons, MD   Pre-Procedure History & Physical: HPI:  EVAJEAN PENISTON is a 73 y.o. female is here for a colonoscopy.   Past Medical History:  Diagnosis Date  . Breast cancer, left ([place]) 2004   Lumpectomy and rad tx's.  . Chronic back pain   . Colon cancer ([place]) 2018  . COPD (chronic obstructive pulmonary disease) ([place])   . Degenerative disc disease, lumbar 04/2013  . Family history of adverse reaction to anesthesia    son arrested after anesthesia  . GERD (gastroesophageal reflux disease)   . Personal history of chemotherapy 2018   Colon  . Personal history of radiation therapy    Breast 2004  . Personal history of radiation therapy 2018   Colon  . Small bowel obstruction ([place]) 04/16/2017  . Wears dentures    full upper    Past Surgical History:  Procedure Laterality Date  . APPENDECTOMY  1965  . BREAST EXCISIONAL BIOPSY Left 08/01/2003   lumpectomy rad 11/04-2/28/2005  . BREAST SURGERY    . CESAREAN SECTION     x3  . COLON SURGERY    . COLONOSCOPY W/ POLYPECTOMY    . COLONOSCOPY WITH PROPOFOL N/A 04/09/2018   Procedure: COLONOSCOPY WITH PROPOFOL;  Surgeon: Virgel Manifold, MD;  Location: [facility] ENDOSCOPY;  Service: Gastroenterology;  Laterality: N/A;  . COLOSTOMY TAKEDOWN N/A 06/25/2018   Procedure: COLOSTOMY TAKEDOWN;  Surgeon: Jules Husbands, MD;  Location: [facility] ORS;  Service: General;  Laterality: N/A;  . ESOPHAGOGASTRODUODENOSCOPY (EGD) WITH PROPOFOL N/A 04/04/2017   Procedure: ESOPHAGOGASTRODUODENOSCOPY (EGD) WITH PROPOFOL;  Surgeon: [name], MD;  Location: [facility];  Service: Endoscopy;  Laterality: N/A;  . FRACTURE SURGERY    . HIP FRACTURE SURGERY Left 01/25/2012  . JOINT REPLACEMENT    . LAPAROTOMY N/A 04/15/2017   Procedure: EXPLORATORY  LAPAROTOMY;  Surgeon: Clayburn Pert, MD;  Location: [facility] ORS;  Service: General;  Laterality: N/A;  . NECK SURGERY  12/2011  . PARASTOMAL HERNIA REPAIR N/A 12/03/2017   Procedure: HERNIA REPAIR PARASTOMAL;  Surgeon: Jules Husbands, MD;  Location: [facility] ORS;  Service: General;  Laterality: N/A;  . PARASTOMAL HERNIA REPAIR N/A 06/25/2018   Procedure: HERNIA REPAIR PARASTOMAL;  Surgeon: Jules Husbands, MD;  Location: [facility] ORS;  Service: General;  Laterality: N/A;  . PORTACATH PLACEMENT Right 05/21/2017   Procedure: INSERTION PORT-A-CATH;  Surgeon: Clayburn Pert, MD;  Location: [facility] ORS;  Service: General;  Laterality: Right;  . WRIST FRACTURE SURGERY      Prior to Admission medications   Medication Sig Start Date End Date Taking? Authorizing Provider  bisacodyl (BISACODYL) 5 MG EC tablet Take 2 tablets (10mg ) by mouth the day before your procedure between 1pm-3pm. 08/09/19  Yes Thoms Barthelemy, Lennette Bihari, MD  budesonide-formoterol (SYMBICORT) 80-4.5 MCG/ACT inhaler Inhale 2 puffs into the lungs 2 (two) times daily. 06/09/18  Yes Laverle Hobby, MD  cholecalciferol (VITAMIN D) 1000 units tablet Take 1,000 Units by mouth daily.   Yes [provider]  docusate sodium (COLACE) 100 MG capsule Take 100 mg by mouth daily.   Yes [provider]  gabapentin (NEURONTIN) 400 MG capsule Take 1 capsule (400 mg total) by mouth 3 (three) times daily. 11/17/18  Yes Earlie Server, MD  HYDROcodone-acetaminophen (NORCO) 7.5-325 MG tablet Take 1-2 tablets by mouth  every 6 (six) hours as needed for moderate pain. 07/26/19  Yes Birdie Sons, MD  meloxicam (MOBIC) 15 MG tablet TAKE 1 TABLET EVERY DAY AS NEEDED 05/20/19  Yes Birdie Sons, MD  montelukast (SINGULAIR) 10 MG tablet TAKE 1 TABLET BY MOUTH EVERY DAY 03/30/19  Yes Laverle Hobby, MD  omeprazole (PRILOSEC) 20 MG capsule Take 1 capsule (20 mg total) by mouth daily. 03/10/19  Yes Birdie Sons, MD  ondansetron (ZOFRAN) 4 MG tablet Take  0.5-1 tablets (2-4 mg total) by mouth every 8 (eight) hours as needed for nausea or vomiting. 05/11/19  Yes Carles Collet M, PA-C  pravastatin (PRAVACHOL) 40 MG tablet TAKE 1 TABLET EVERY DAY 05/20/19  Yes Birdie Sons, MD  Probiotic Product (PROBIOTIC DAILY PO) Take 1 capsule by mouth daily.    Yes [provider]  Triamcinolone Acetonide (NASACORT ALLERGY 24HR NA) Place 2 sprays into the nose daily as needed (allergies).    Yes [provider]    Allergies as of 08/09/2019 - Review Complete 08/09/2019  Allergen Reaction Noted  . Betadine [povidone iodine] Other (See Comments) 12/02/2017  . Iodine Other (See Comments) 04/13/2015  . Other Nausea And Vomiting 05/15/2017  . Sinus formula [cholestatin] Nausea Only 04/13/2015    Family History  Problem Relation Age of Onset  . Diabetes Mother        type 2  . Coronary artery disease Mother   . Deep vein thrombosis Mother   . Colon cancer Father   . Asthma Brother   . Diabetes Brother        type 2  . Breast cancer Neg Hx     Social History   Socioeconomic History  . Marital status: Divorced    Spouse name: Not on file  . Number of children: 3  . Years of education: Not on file  . Highest education level: 7th grade  Occupational History  . Occupation: retired  Scientific laboratory technician  . Financial resource strain: Somewhat hard  . Food insecurity    Worry: Never true    Inability: Never true  . Transportation needs    Medical: No    Non-medical: No  Tobacco Use  . Smoking status: Former Smoker    Packs/day: 0.25    Years: 55.00    Pack years: 13.75    Types: Cigarettes    Quit date: 05/21/2017    Years since quitting: 2.2  . Smokeless tobacco: Never Used  . Tobacco comment: started age 20 1/2 to 1 ppd  Substance and Sexual Activity  . Alcohol use: Yes    Alcohol/week: 0.0 standard drinks    Comment: occasionally drinks beer  . Drug use: No  . Sexual activity: Never  Lifestyle  . Physical activity     Days per week: 0 days    Minutes per session: 0 min  . Stress: Not at all  Relationships  . Social Herbalist on phone: Patient refused    Gets together: Patient refused    Attends religious service: Patient refused    Active member of club or organization: Patient refused    Attends meetings of clubs or organizations: Patient refused    Relationship status: Patient refused  . Intimate partner violence    Fear of current or ex partner: Patient refused    Emotionally abused: Patient refused    Physically abused: Patient refused    Forced sexual activity: Patient refused  Other Topics Concern  .  Not on file  Social History Narrative  . Not on file    Review of Systems: See HPI, otherwise negative ROS  Physical Exam: Ht 5\' 4"  (1.626 m)   Wt 73 kg   BMI 27.64 kg/m  General:   Alert,  pleasant and cooperative in NAD Head:  Normocephalic and atraumatic. Neck:  Supple; no masses or thyromegaly. Lungs:  Clear throughout to auscultation, normal respiratory effort.    Heart:  +S1, +S2, Regular rate and rhythm, No edema. Abdomen:  Soft, nontender and nondistended. Normal bowel sounds, without guarding, and without rebound.   Neurologic:  Alert and  oriented x4;  grossly normal neurologically.  Impression/Plan: [name] is here for a colonoscopy to be performed for history of colon cancer  Risks, benefits, limitations, and alternatives regarding  colonoscopy have been reviewed with the patient.  Questions have been answered.  All parties agreeable.   Virgel Manifold, MD  08/13/2019, 8:12 AM

## 2019-08-13 NOTE — Anesthesia Postprocedure Evaluation (Signed)
Anesthesia Post Note  Patient: Kimberly [name]  Procedure(s) Performed: COLONOSCOPY WITH PROPOFOL (N/A )     Patient location during evaluation: PACU Anesthesia Type: General Level of consciousness: awake and alert and oriented Pain management: satisfactory to patient Vital Signs Assessment: post-procedure vital signs reviewed and stable Respiratory status: spontaneous breathing, nonlabored ventilation and respiratory function stable Cardiovascular status: blood pressure returned to baseline and stable Postop Assessment: Adequate PO intake and No signs of nausea or vomiting Anesthetic complications: no    Raliegh Ip

## 2019-08-13 NOTE — Anesthesia Preprocedure Evaluation (Signed)
Anesthesia Evaluation  Patient identified by MRN, date of birth, ID band Patient awake    Reviewed: Allergy & Precautions, H&P , NPO status , Patient's Chart, lab work & pertinent test results  Airway Mallampati: II  TM Distance: >3 FB Neck ROM: full    Dental  (+) Edentulous Upper, Poor Dentition   Pulmonary COPD,  COPD inhaler, former smoker,    Pulmonary exam normal breath sounds clear to auscultation       Cardiovascular + CAD  Normal cardiovascular exam Rhythm:regular Rate:Normal     Neuro/Psych    GI/Hepatic GERD  ,  Endo/Other    Renal/GU      Musculoskeletal   Abdominal   Peds  Hematology   Anesthesia Other Findings   Reproductive/Obstetrics                             Anesthesia Physical Anesthesia Plan  ASA: III  Anesthesia Plan: General   Post-op Pain Management:    Induction: Intravenous  PONV Risk Score and Plan: 3 and Propofol infusion, Treatment may vary due to age or medical condition and TIVA  Airway Management Planned: Natural Airway  Additional Equipment:   Intra-op Plan:   Post-operative Plan:   Informed Consent: I have reviewed the patients History and Physical, chart, labs and discussed the procedure including the risks, benefits and alternatives for the proposed anesthesia with the patient or authorized representative who has indicated his/her understanding and acceptance.       Plan Discussed with: CRNA  Anesthesia Plan Comments:         Anesthesia Quick Evaluation

## 2019-08-13 NOTE — Transfer of Care (Signed)
Immediate Anesthesia Transfer of Care Note  Patient: Kimberly [name]  Procedure(s) Performed: COLONOSCOPY WITH PROPOFOL (N/A )  Patient Location: PACU  Anesthesia Type: General  Level of Consciousness: awake, alert  and patient cooperative  Airway and Oxygen Therapy: Patient Spontanous Breathing and Patient connected to supplemental oxygen  Post-op Assessment: Post-op Vital signs reviewed, Patient's Cardiovascular Status Stable, Respiratory Function Stable, Patent Airway and No signs of Nausea or vomiting  Post-op Vital Signs: Reviewed and stable  Complications: No apparent anesthesia complications

## 2019-08-14 DIAGNOSIS — S72046A Nondisplaced fracture of base of neck of unspecified femur, initial encounter for closed fracture: Secondary | ICD-10-CM | POA: Diagnosis not present

## 2019-08-14 DIAGNOSIS — R062 Wheezing: Secondary | ICD-10-CM | POA: Diagnosis not present

## 2019-08-14 DIAGNOSIS — J441 Chronic obstructive pulmonary disease with (acute) exacerbation: Secondary | ICD-10-CM | POA: Diagnosis not present

## 2019-08-17 ENCOUNTER — Encounter: Payer: Self-pay | Admitting: Gastroenterology

## 2019-08-24 ENCOUNTER — Other Ambulatory Visit: Payer: Self-pay | Admitting: Family Medicine

## 2019-08-24 DIAGNOSIS — M5136 Other intervertebral disc degeneration, lumbar region: Secondary | ICD-10-CM

## 2019-08-24 MED ORDER — HYDROCODONE-ACETAMINOPHEN 7.5-325 MG PO TABS: 1.0000 | ORAL_TABLET | Freq: Four times a day (QID) | ORAL | 0 refills | Status: DC | PRN

## 2019-08-25 ENCOUNTER — Encounter: Payer: Self-pay | Admitting: Gastroenterology

## 2019-08-31 ENCOUNTER — Other Ambulatory Visit: Payer: Self-pay

## 2019-09-01 ENCOUNTER — Ambulatory Visit
Admission: RE | Admit: 2019-09-01 | Discharge: 2019-09-01 | Disposition: A | Discharge status: Home / Self Care | Payer: Medicare HMO | Source: Ambulatory Visit | Attending: Radiation Oncology | Admitting: Radiation Oncology

## 2019-09-01 ENCOUNTER — Inpatient Hospital Stay (HOSPITAL_BASED_OUTPATIENT_CLINIC_OR_DEPARTMENT_OTHER): Payer: Medicare HMO | Admitting: Oncology

## 2019-09-01 ENCOUNTER — Encounter: Payer: Self-pay | Admitting: Radiation Oncology

## 2019-09-01 ENCOUNTER — Encounter: Payer: Self-pay | Admitting: Oncology

## 2019-09-01 ENCOUNTER — Other Ambulatory Visit: Payer: Self-pay

## 2019-09-01 ENCOUNTER — Inpatient Hospital Stay: Payer: Medicare HMO | Attending: Oncology | Admitting: Oncology

## 2019-09-01 VITALS — BP 159/91 | Wt 159.8 lb

## 2019-09-01 VITALS — BP 154/91 | HR 89 | Temp 98.8°F | Wt 159.0 lb

## 2019-09-01 DIAGNOSIS — Z800 Family history of malignant neoplasm of digestive organs: Secondary | ICD-10-CM | POA: Insufficient documentation

## 2019-09-01 DIAGNOSIS — Z853 Personal history of malignant neoplasm of breast: Secondary | ICD-10-CM

## 2019-09-01 DIAGNOSIS — Z86010 Personal history of colonic polyps: Secondary | ICD-10-CM | POA: Insufficient documentation

## 2019-09-01 DIAGNOSIS — Z87891 Personal history of nicotine dependence: Secondary | ICD-10-CM | POA: Diagnosis not present

## 2019-09-01 DIAGNOSIS — Z836 Family history of other diseases of the respiratory system: Secondary | ICD-10-CM | POA: Diagnosis not present

## 2019-09-01 DIAGNOSIS — Z79899 Other long term (current) drug therapy: Secondary | ICD-10-CM | POA: Insufficient documentation

## 2019-09-01 DIAGNOSIS — R970 Elevated carcinoembryonic antigen [CEA]: Secondary | ICD-10-CM | POA: Diagnosis not present

## 2019-09-01 DIAGNOSIS — R519 Headache, unspecified: Secondary | ICD-10-CM

## 2019-09-01 DIAGNOSIS — Z923 Personal history of irradiation: Secondary | ICD-10-CM | POA: Diagnosis not present

## 2019-09-01 DIAGNOSIS — R634 Abnormal weight loss: Secondary | ICD-10-CM | POA: Insufficient documentation

## 2019-09-01 DIAGNOSIS — Z933 Colostomy status: Secondary | ICD-10-CM | POA: Insufficient documentation

## 2019-09-01 DIAGNOSIS — H93A9 Pulsatile tinnitus, unspecified ear: Secondary | ICD-10-CM | POA: Insufficient documentation

## 2019-09-01 DIAGNOSIS — C184 Malignant neoplasm of transverse colon: Secondary | ICD-10-CM

## 2019-09-01 DIAGNOSIS — Z888 Allergy status to other drugs, medicaments and biological substances status: Secondary | ICD-10-CM | POA: Insufficient documentation

## 2019-09-01 DIAGNOSIS — R140 Abdominal distension (gaseous): Secondary | ICD-10-CM | POA: Diagnosis not present

## 2019-09-01 DIAGNOSIS — Z833 Family history of diabetes mellitus: Secondary | ICD-10-CM | POA: Diagnosis not present

## 2019-09-01 DIAGNOSIS — C779 Secondary and unspecified malignant neoplasm of lymph node, unspecified: Secondary | ICD-10-CM | POA: Diagnosis not present

## 2019-09-01 DIAGNOSIS — Z8249 Family history of ischemic heart disease and other diseases of the circulatory system: Secondary | ICD-10-CM | POA: Diagnosis not present

## 2019-09-01 DIAGNOSIS — R109 Unspecified abdominal pain: Secondary | ICD-10-CM | POA: Insufficient documentation

## 2019-09-01 DIAGNOSIS — J449 Chronic obstructive pulmonary disease, unspecified: Secondary | ICD-10-CM | POA: Diagnosis not present

## 2019-09-01 LAB — CBC WITH DIFFERENTIAL/PLATELET
Abs Immature Granulocytes: 0.05 10*3/uL (ref 0.00–0.07)
Basophils Absolute: 0.1 10*3/uL (ref 0.0–0.1)
Basophils Relative: 1 %
Eosinophils Absolute: 0.1 10*3/uL (ref 0.0–0.5)
Eosinophils Relative: 1 %
HCT: 42.8 % (ref 36.0–46.0)
Hemoglobin: 13.2 g/dL (ref 12.0–15.0)
Immature Granulocytes: 0 %
Lymphocytes Relative: 19 %
Lymphs Abs: 2.2 10*3/uL (ref 0.7–4.0)
MCH: 27 pg (ref 26.0–34.0)
MCHC: 30.8 g/dL (ref 30.0–36.0)
MCV: 87.5 fL (ref 80.0–100.0)
Monocytes Absolute: 1.1 10*3/uL — ABNORMAL HIGH (ref 0.1–1.0)
Monocytes Relative: 10 %
Neutro Abs: 7.9 10*3/uL — ABNORMAL HIGH (ref 1.7–7.7)
Neutrophils Relative %: 69 %
Platelets: 381 10*3/uL (ref 150–400)
RBC: 4.89 MIL/uL (ref 3.87–5.11)
RDW: 16.6 % — ABNORMAL HIGH (ref 11.5–15.5)
WBC: 11.4 10*3/uL — ABNORMAL HIGH (ref 4.0–10.5)
nRBC: 0 % (ref 0.0–0.2)

## 2019-09-01 LAB — COMPREHENSIVE METABOLIC PANEL
ALT: 10 U/L (ref 0–44)
AST: 12 U/L — ABNORMAL LOW (ref 15–41)
Albumin: 3.6 g/dL (ref 3.5–5.0)
Alkaline Phosphatase: 73 U/L (ref 38–126)
Anion gap: 10 (ref 5–15)
BUN: 17 mg/dL (ref 8–23)
CO2: 25 mmol/L (ref 22–32)
Calcium: 9.1 mg/dL (ref 8.9–10.3)
Chloride: 101 mmol/L (ref 98–111)
Creatinine, Ser: 0.66 mg/dL (ref 0.44–1.00)
GFR calc Af Amer: >60 mL/min (ref >60)
GFR calc non Af Amer: >60 mL/min (ref >60)
Glucose, Bld: 101 mg/dL — ABNORMAL HIGH (ref 70–99)
Potassium: 4.1 mmol/L (ref 3.5–5.1)
Sodium: 136 mmol/L (ref 135–145)
Total Bilirubin: 0.4 mg/dL (ref 0.3–1.2)
Total Protein: 7.7 g/dL (ref 6.5–8.1)

## 2019-09-01 NOTE — Progress Notes (Signed)
Patient states that for about 3-4 weeks now she has been having a lot of headaches where she can hear her heartbeat in her head.

## 2019-09-01 NOTE — Progress Notes (Signed)
Radiation Oncology Follow up Note  Name: Kimberly [name]   Date:   09/01/2019 MRN:  [id number] DOB: 1946-08-26    This 73 y.o. female presents to the clinic today for 74-month follow-up status post adjuvant radiation therapy to transverse colon for stage IIIa (T4 N1 M0) adenocarcinoma of the transverse colon.Marland Kitchen  REFERRING PROVIDER: Birdie Sons, MD  HPI: Patient is a 73 year old female now out 15 months having completed adjuvant radiation therapy to her tumor bed status post transverse colon colectomy for T4N1 moderately differentiated adenocarcinoma.  Seen today in routine follow-up she is doing fairly well she is having no problems with bowel function.  She does occasionally have abdominal pain may be related to scarring.  Her most recent CT scan was I have reviewed.  Shows right hemicolectomy with some nodularity along the margin of the proximal transverse colon which is stable and could be from scarring.  Follow-up CT scan or PET CT scan CT scan was recommended.  Her PSA is gradually been rising at 7.8.  She had a colonoscopy in July which was unremarkable.  COMPLICATIONS OF TREATMENT: none  FOLLOW UP COMPLIANCE: keeps appointments   PHYSICAL EXAM:  BP (!) 159/91 (BP Location: Left Arm, Patient Position: Sitting)   Wt 159 lb 12.8 oz (72.5 kg)   BMI 27.43 kg/m  Well-developed well-nourished patient in NAD. HEENT reveals PERLA, EOMI, discs not visualized.  Oral cavity is clear. No oral mucosal lesions are identified. Neck is clear without evidence of cervical or supraclavicular adenopathy. Lungs are clear to A&P. Cardiac examination is essentially unremarkable with regular rate and rhythm without murmur rub or thrill. Abdomen is benign with no organomegaly or masses noted. Motor sensory and DTR levels are equal and symmetric in the upper and lower extremities. Cranial nerves II through XII are grossly intact. Proprioception is intact. No peripheral adenopathy or edema is identified. No motor  or sensory levels are noted. Crude visual fields are within normal range.  RADIOLOGY RESULTS: CT scan reviewed compatible with above-stated findings.  PLAN: Present time she is doing well.  Certainly a slight concern is a rising CEA.  She may benefit from a PET scan in the near future should those areas of nodularity on her follow-up CT scans start to increase in size.  Otherwise am pleased with her overall progress.  I have asked to see her back in 1 year for follow-up.  Patient knows to call me with any concerns at any time.  I would like to take this opportunity to thank you for allowing me to participate in the care of your patient.Noreene Filbert, MD

## 2019-09-02 LAB — CEA: CEA: 9.7 ng/mL — ABNORMAL HIGH (ref 0.0–4.7)

## 2019-09-03 NOTE — Progress Notes (Signed)
[facility] Follow up  Visit:  Patient Care Team: Birdie Sons, MD as PCP - General (Family Medicine) Earlie Server, MD as Consulting Physician (Oncology) Noreene Filbert, MD as Referring Physician (Radiation Oncology) Jules Husbands, MD as Consulting Physician (General Surgery) Benedetto Goad, RN as Registered Nurse Cathi Roan, [facility] (Pharmacist)  PURPOSE OF VISIT: Adjuvant chemotherapy for colon cancer  HISTORY OF PRESENTING ILLNESS: Kimberly [name] 73 y.o. female with PMH listed as below presents for follow up for the management of Stage IIIB Colon Cancer Patient reports a remote history of breast cancer s/p lumpectomy and radiation treatments.   Patient had been having abdominal pain, weight loss and bloating.  CT scan showed partial obstruction at the level of transverse colon and ileocolic mesenteric adenopathy. She was prepared for a colonoscopy on 04/15/2017. She started to have worsened abdominal pain, nausea vomiting, unable to maintain oral intake and was sent to ED. She was found to have bowel obstruction and underwent  exploratory laparotomy with transverse colectomy, with end colostomy and mucous fistula formation for obstructing colon lesion. Differential diagnosis prior to surgery was metastatic breast cancer in colon vs colon cancer. The patient did have a colonoscopy 2 years ago without the mass being present at that time but did have 2 small polyps in the transverse colon that were removed.  04/15/2017 Patient underwent transverse colon resection.  Pathology showed T4aN1 moderate differentiated adenocarcinoma, Grade 2, tumor invades visceral peritoneum, LVI present, negative margin, 3/16 lymph nodes involved with cancer. Low probability of MSI-H.   # Genetic test INVITAE came back negative. No pathogenetic sequence variants or deletions/duplications identified. Results was scanned to [employer] (a copy of the report was provided to patient and her  daughter)  # # baseline CEA is 2.1 # colostomy reversal on 06/25/2018, subsequently developed C. difficile colitis x 2.  Multiple abdominal CT abdomen and pelvis for obtained, not listed here #09/01/2018 CT abdomen pelvis showed abdominal wall abscess,  percutaneous drain placement .treatment with antibiotics with Augmentin 10 days course.  #09/14/2089 CT abdomen/pelvis showed resolution of abscess, no intraabdominal abscess.   Mediport was discontinued.  TREATMENT:  04/15/2017 transverse colon resection 05/27/2017- 11/17/2017 adjuvant FOLFOX, Oxaliplatin was discontinued after 3 cycles due to worsening of pre-existing neuropathy. Finished another 9 cycles of 5-FU.  March -April 2019 adjuvant RT.  06/25/2018 Colostomy Reversal.    INTERVAL HISTORY 73 y.o. female  presents for follow-up of Stage IIIB colon cancer. She tolerates well. She has been having headaches with pulsatile tinnitus No headache today.  Denies fever, chills, nausea, vomiting, diarrhea, chest pain, shortness of breath, abdominal pain, urinary symptoms, lower extremity swelling.  Appetite is good.  She lost 3 pounds during the interval. Appetite is good.     Review of Systems  Constitutional: Negative for appetite change, chills, fatigue, fever and unexpected weight change.  HENT:   Negative for hearing loss, lump/mass, nosebleeds and voice change.   Eyes: Negative for eye problems.  Respiratory: Negative for chest tightness, cough and shortness of breath.   Cardiovascular: Negative for chest pain and leg swelling.  Gastrointestinal: Negative for abdominal distention, abdominal pain, blood in stool, constipation, diarrhea and nausea.  Endocrine: Negative for hot flashes.  Genitourinary: Negative for difficulty urinating, dysuria and frequency.   Musculoskeletal: Negative for arthralgias, gait problem and myalgias.  Skin: Negative for itching, rash and wound.  Neurological: Positive for headaches. Negative for dizziness,  extremity weakness and gait problem.  Chronic numbness and tingling of fingertips and toes.  Hematological: Negative for adenopathy. Does not bruise/bleed easily.  Psychiatric/Behavioral: Negative for confusion, decreased concentration and depression. The patient is not nervous/anxious.       MEDICAL HISTORY: Past Medical History:  Diagnosis Date  . Breast cancer, left ([place]) 2004   Lumpectomy and rad tx's.  . Chronic back pain   . Colon cancer ([place]) 2018  . COPD (chronic obstructive pulmonary disease) ([place])   . Degenerative disc disease, lumbar 04/2013  . Family history of adverse reaction to anesthesia    son arrested after anesthesia  . GERD (gastroesophageal reflux disease)   . Personal history of chemotherapy 2018   Colon  . Personal history of radiation therapy    Breast 2004  . Personal history of radiation therapy 2018   Colon  . Small bowel obstruction ([place]) 04/16/2017  . Wears dentures    full upper    SURGICAL HISTORY: Past Surgical History:  Procedure Laterality Date  . APPENDECTOMY  1965  . BREAST EXCISIONAL BIOPSY Left 08/01/2003   lumpectomy rad 11/04-2/28/2005  . BREAST SURGERY    . CESAREAN SECTION     x3  . COLON SURGERY    . COLONOSCOPY W/ POLYPECTOMY    . COLONOSCOPY WITH PROPOFOL N/A 04/09/2018   Procedure: COLONOSCOPY WITH PROPOFOL;  Surgeon: Virgel Manifold, MD;  Location: [facility];  Service: Gastroenterology;  Laterality: N/A;  . COLONOSCOPY WITH PROPOFOL N/A 08/13/2019   Procedure: COLONOSCOPY WITH PROPOFOL;  Surgeon: Virgel Manifold, MD;  Location: [place];  Service: Endoscopy;  Laterality: N/A;  . COLOSTOMY TAKEDOWN N/A 06/25/2018   Procedure: COLOSTOMY TAKEDOWN;  Surgeon: Jules Husbands, MD;  Location: [facility];  Service: General;  Laterality: N/A;  . ESOPHAGOGASTRODUODENOSCOPY (EGD) WITH PROPOFOL N/A 04/04/2017   Procedure: ESOPHAGOGASTRODUODENOSCOPY (EGD) WITH PROPOFOL;  Surgeon: [name], MD;  Location:  [place];  Service: Endoscopy;  Laterality: N/A;  . FRACTURE SURGERY    . HIP FRACTURE SURGERY Left 01/25/2012  . JOINT REPLACEMENT    . LAPAROTOMY N/A 04/15/2017   Procedure: EXPLORATORY LAPAROTOMY;  Surgeon: Clayburn Pert, MD;  Location: [facility];  Service: General;  Laterality: N/A;  . NECK SURGERY  12/2011  . PARASTOMAL HERNIA REPAIR N/A 12/03/2017   Procedure: HERNIA REPAIR PARASTOMAL;  Surgeon: Jules Husbands, MD;  Location: [facility];  Service: General;  Laterality: N/A;  . PARASTOMAL HERNIA REPAIR N/A 06/25/2018   Procedure: HERNIA REPAIR PARASTOMAL;  Surgeon: Jules Husbands, MD;  Location: [facility];  Service: General;  Laterality: N/A;  . PORTACATH PLACEMENT Right 05/21/2017   Procedure: INSERTION PORT-A-CATH;  Surgeon: Clayburn Pert, MD;  Location: [facility];  Service: General;  Laterality: Right;  . WRIST FRACTURE SURGERY      SOCIAL HISTORY: Social History   Socioeconomic History  . Marital status: Divorced    Spouse name: Not on file  . Number of children: 3  . Years of education: Not on file  . Highest education level: 7th grade  Occupational History  . Occupation: retired  Scientific laboratory technician  . Financial resource strain: Somewhat hard  . Food insecurity    Worry: Never true    Inability: Never true  . Transportation needs    Medical: No    Non-medical: No  Tobacco Use  . Smoking status: Former Smoker    Packs/day: 0.25    Years: 55.00    Pack years: 13.75    Types: Cigarettes    Quit  date: 05/21/2017    Years since quitting: 2.2  . Smokeless tobacco: Never Used  . Tobacco comment: started age 25 1/3 to 1 ppd  Substance and Sexual Activity  . Alcohol use: Yes    Alcohol/week: 0.0 standard drinks    Comment: occasionally drinks beer  . Drug use: No  . Sexual activity: Never  Lifestyle  . Physical activity    Days per week: 0 days    Minutes per session: 0 min  . Stress: Not at all  Relationships  . Social Herbalist on phone: Patient  refused    Gets together: Patient refused    Attends religious service: Patient refused    Active member of club or organization: Patient refused    Attends meetings of clubs or organizations: Patient refused    Relationship status: Patient refused  . Intimate partner violence    Fear of current or ex partner: Patient refused    Emotionally abused: Patient refused    Physically abused: Patient refused    Forced sexual activity: Patient refused  Other Topics Concern  . Not on file  Social History Narrative  . Not on file    FAMILY HISTORY Family History  Problem Relation Age of Onset  . Diabetes Mother        type 2  . Coronary artery disease Mother   . Deep vein thrombosis Mother   . Colon cancer Father   . Asthma Brother   . Diabetes Brother        type 2  . Breast cancer Neg Hx     ALLERGIES:  is allergic to betadine [povidone iodine]; iodine; other; and sinus formula [cholestatin].  MEDICATIONS:  Current Outpatient Medications  Medication Sig Dispense Refill  . bisacodyl (BISACODYL) 5 MG EC tablet Take 2 tablets (42m) by mouth the day before your procedure between 1pm-3pm. 2 tablet 0  . budesonide-formoterol (SYMBICORT) 80-4.5 MCG/ACT inhaler Inhale 2 puffs into the lungs 2 (two) times daily. 3 Inhaler 3  . cholecalciferol (VITAMIN D) 1000 units tablet Take 1,000 Units by mouth daily.    .Marland Kitchendocusate sodium (COLACE) 100 MG capsule Take 100 mg by mouth daily.    .Marland Kitchengabapentin (NEURONTIN) 400 MG capsule Take 1 capsule (400 mg total) by mouth 3 (three) times daily. 270 capsule 1  . HYDROcodone-acetaminophen (NORCO) 7.5-325 MG tablet Take 1-2 tablets by mouth every 6 (six) hours as needed for moderate pain. 120 tablet 0  . meloxicam (MOBIC) 15 MG tablet TAKE 1 TABLET EVERY DAY AS NEEDED 90 tablet 3  . montelukast (SINGULAIR) 10 MG tablet TAKE 1 TABLET BY MOUTH EVERY DAY 90 tablet 0  . omeprazole (PRILOSEC) 20 MG capsule Take 1 capsule (20 mg total) by mouth daily. 90 capsule  4  . ondansetron (ZOFRAN) 4 MG tablet Take 0.5-1 tablets (2-4 mg total) by mouth every 8 (eight) hours as needed for nausea or vomiting. 20 tablet 0  . pravastatin (PRAVACHOL) 40 MG tablet TAKE 1 TABLET EVERY DAY 90 tablet 4  . Probiotic Product (PROBIOTIC DAILY PO) Take 1 capsule by mouth daily.     . Triamcinolone Acetonide (NASACORT ALLERGY 24HR NA) Place 2 sprays into the nose daily as needed (allergies).      No current facility-administered medications for this visit.     PHYSICAL EXAMINATION:  ECOG PERFORMANCE STATUS: 1 - Symptomatic but completely ambulatory  Vitals:   09/01/19 1401 09/01/19 1413  BP: (!) 159/91 (!) 154/91  Pulse: 89  Temp: 98.8 F (37.1 C)    Filed Weights   09/01/19 1401  Weight: 159 lb (72.1 kg)    Physical Exam  Constitutional: She is oriented to person, place, and time. No distress.  HENT:  Head: Normocephalic and atraumatic.  Mouth/Throat: No oropharyngeal exudate.  Eyes: Pupils are equal, round, and reactive to light. Conjunctivae and EOM are normal. Left eye exhibits no discharge. No scleral icterus.  Neck: Normal range of motion. Neck supple.  Cardiovascular: Normal rate, regular rhythm and normal heart sounds.  No murmur heard. Pulmonary/Chest: Effort normal. No respiratory distress. She has no wheezes. She has no rales. She exhibits no tenderness.  Abdominal: Soft. She exhibits no distension. There is no abdominal tenderness.  Musculoskeletal: Normal range of motion.        General: No edema.  Lymphadenopathy:    She has no cervical adenopathy.  Neurological: She is alert and oriented to person, place, and time. No cranial nerve deficit. She exhibits normal muscle tone. Gait normal. Coordination normal.  Skin: Skin is warm and dry. She is not diaphoretic. No erythema.  Psychiatric: Affect and judgment normal.      LABORATORY DATA: I have personally reviewed the data as listed: CBC Latest Ref Rng & Units 09/01/2019 06/02/2019 03/10/2019   WBC 4.0 - 10.5 K/uL 11.4(H) 10.1 10.7(H)  Hemoglobin 12.0 - 15.0 g/dL 13.2 12.4 12.6  Hematocrit 36.0 - 46.0 % 42.8 38.3 38.7  Platelets 150 - 400 K/uL 381 347 376   CMP Latest Ref Rng & Units 09/01/2019 06/02/2019 03/10/2019  Glucose 70 - 99 mg/dL 101(H) 88 89  BUN 8 - 23 mg/dL _0 Creatinine 0.44 - 1.00 mg/dL 0.66 0.62 0.74  Sodium 135 - 145 mmol/L 136 136 136  Potassium 3.5 - 5.1 mmol/L 4.1 3.6 4.2  Chloride 98 - 111 mmol/L 101 102 101  CO2 22 - 32 mmol/L _1 Calcium 8.9 - 10.3 mg/dL 9.1 8.8(L) 8.6(L)  Total Protein 6.5 - 8.1 g/dL 7.7 7.6 7.6  Total Bilirubin 0.3 - 1.2 mg/dL 0.4 0.4 0.4  Alkaline Phos 38 - 126 U/L 73 66 80  AST 15 - 41 U/L 12(L) 14(L) 13(L)  ALT 0 - 44 U/L _2 RADIOGRAPHIC STUDIES: I have personally reviewed the radiological images as listed and agreed with the findings in the report.    ASSESSMENT/PLAN 73yo female who has MSI  stable stage IIIB Colon Cancer presents for follow up  Cancer Staging Malignant neoplasm of transverse colon [facility]) Staging form: Colon and Rectum, AJCC 8th Edition - Clinical stage from 04/15/2017: Stage IIIB (cT4a, cN1b, cM0) - Signed by Earlie Server, MD on 04/26/2017  1. Malignant neoplasm of transverse colon (HCC)   2. Elevated CEA   3. Nonintractable headache, unspecified chronicity pattern, unspecified headache type   4. Personal history of malignant neoplasm of breast    #History of stage III colon cancer -03/2017 Clinically doing well.  Labs are reviewed and discussed with patient. CEA rises, 9.7. she is not an active smoker.  She has also continued to loose weight.  08/13/2019 colonoscopy showed localized area of mildly thickened folds of the mucosa was found in the sigmoid colon. Biopsy pathology showed benign mucosa with lymphoid aggregates and focal minimal inflammation. No dysplasia.  Her CT in September showed Indistinct nodularity posterior to the intra colic anastomosis and nodularity along the margin of  the proximal transverse colon. Non specific.  Given the progressive increase of CEA,  I will obtain PET scan for further evaluation.   # History of breast Cancer, annual bilateral screening mammogram was done on 12/07/2018.   Need screening mammogram 12/07/2018.  # Headache, etiology unknown. Pending work up # Adrenal adenoma, stable.  monitor # Leukocytosis, intermittent monocytosis, she declined bone marrow biopsy on 05/22/2018. Monitor.  Orders Placed This Encounter  Procedures  . NM PET Image Restag (PS) Skull Base To Thigh    Standing Status:   Future    Standing Expiration Date:   09/02/2020    Order Specific Question:   ** REASON FOR EXAM (FREE TEXT)    Answer:   serial elevated CEA    Order Specific Question:   If indicated for the ordered procedure, I authorize the administration of a radiopharmaceutical per Radiology protocol    Answer:   Yes    Order Specific Question:   Preferred imaging location?    Answer:   [facility]    Order Specific Question:   Radiology Contrast Protocol - do NOT remove file path    Answer:   [email or web address]  . CBC with Differential/Platelet    Standing Status:   Standing    Number of Occurrences:   10    Standing Expiration Date:   09/02/2020  . Comprehensive metabolic panel    Standing Status:   Standing    Number of Occurrences:   10    Standing Expiration Date:   09/02/2020  . CEA    Standing Status:   Standing    Number of Occurrences:   10    Standing Expiration Date:   09/02/2020   follow up to be determined   Earlie Server, MD, PhD Hematology Oncology [facility] Pager- [phone] 09/03/19

## 2019-09-03 NOTE — Progress Notes (Signed)
Please arrange her to get PET scan,. Thanks.    Dear Kimberly [name], You CEA continues to rise. Although CEA elevation can be non specific, given your history, I would obtain additional image work up with PET scan.   You will be contacted for PET scan appointment.   Dr.Rahul Malinak

## 2019-09-06 ENCOUNTER — Telehealth: Payer: Self-pay

## 2019-09-06 NOTE — Telephone Encounter (Signed)
-----   Message from Earlie Server, MD sent at 09/03/2019  6:45 PM EST ----- Please arrange her to get PET scan,. Thanks.    Dear Kimberly [name], You CEA continues to rise. Although CEA elevation can be non specific, given your history, I would obtain additional image work up with PET scan.   You will be contacted for PET scan appointment.   Dr.Yu

## 2019-09-07 NOTE — Telephone Encounter (Signed)
PET scan has been scheduled by Ellison Hughs.

## 2019-09-13 DIAGNOSIS — J441 Chronic obstructive pulmonary disease with (acute) exacerbation: Secondary | ICD-10-CM | POA: Diagnosis not present

## 2019-09-13 DIAGNOSIS — R062 Wheezing: Secondary | ICD-10-CM | POA: Diagnosis not present

## 2019-09-13 DIAGNOSIS — S72046A Nondisplaced fracture of base of neck of unspecified femur, initial encounter for closed fracture: Secondary | ICD-10-CM | POA: Diagnosis not present

## 2019-09-21 ENCOUNTER — Encounter
Admission: RE | Admit: 2019-09-21 | Discharge: 2019-09-21 | Disposition: A | Discharge status: Home / Self Care | Payer: Medicare HMO | Source: Ambulatory Visit | Attending: Oncology | Admitting: Oncology

## 2019-09-21 ENCOUNTER — Other Ambulatory Visit: Payer: Self-pay

## 2019-09-21 DIAGNOSIS — Z79899 Other long term (current) drug therapy: Secondary | ICD-10-CM | POA: Insufficient documentation

## 2019-09-21 DIAGNOSIS — C184 Malignant neoplasm of transverse colon: Secondary | ICD-10-CM | POA: Insufficient documentation

## 2019-09-21 DIAGNOSIS — K118 Other diseases of salivary glands: Secondary | ICD-10-CM | POA: Diagnosis not present

## 2019-09-21 DIAGNOSIS — K119 Disease of salivary gland, unspecified: Secondary | ICD-10-CM | POA: Diagnosis not present

## 2019-09-21 DIAGNOSIS — C189 Malignant neoplasm of colon, unspecified: Secondary | ICD-10-CM | POA: Diagnosis not present

## 2019-09-21 LAB — GLUCOSE, CAPILLARY: Glucose-Capillary: 111 mg/dL — ABNORMAL HIGH (ref 70–99)

## 2019-09-21 MED ORDER — FLUDEOXYGLUCOSE F - 18 (FDG) INJECTION
8.2000 | Freq: Once | INTRAVENOUS | Status: AC | PRN
  Administered 2019-09-21: 12:00:00 8.655 via INTRAVENOUS

## 2019-09-23 ENCOUNTER — Other Ambulatory Visit: Payer: Self-pay | Admitting: Oncology

## 2019-09-27 ENCOUNTER — Other Ambulatory Visit: Payer: Self-pay

## 2019-09-27 ENCOUNTER — Other Ambulatory Visit: Payer: Self-pay | Admitting: Physician Assistant

## 2019-09-27 ENCOUNTER — Other Ambulatory Visit: Payer: Self-pay | Admitting: Family Medicine

## 2019-09-27 DIAGNOSIS — M5136 Other intervertebral disc degeneration, lumbar region: Secondary | ICD-10-CM

## 2019-09-27 MED ORDER — HYDROCODONE-ACETAMINOPHEN 7.5-325 MG PO TABS: 1.0000 | ORAL_TABLET | Freq: Four times a day (QID) | ORAL | 0 refills | Status: DC | PRN

## 2019-09-27 MED ORDER — ONDANSETRON HCL 4 MG PO TABS: 2.0000 mg | ORAL_TABLET | Freq: Three times a day (TID) | ORAL | 0 refills | Status: DC | PRN

## 2019-09-30 ENCOUNTER — Telehealth: Payer: Self-pay

## 2019-09-30 ENCOUNTER — Other Ambulatory Visit: Payer: Self-pay | Admitting: Oncology

## 2019-09-30 ENCOUNTER — Other Ambulatory Visit: Payer: Medicare HMO

## 2019-09-30 DIAGNOSIS — C184 Malignant neoplasm of transverse colon: Secondary | ICD-10-CM

## 2019-09-30 NOTE — Telephone Encounter (Signed)
Per Dr. Tasia Catchings," Costella Hatcher ne CT biopsy [ radiology may want to change to US guided which is fine], i ordered. Please let her know that PET showed some abnormality and I recommend biopsy. Ask if she wants to do a virtual visit with me or not. i can explain more if she wants.  i will need to see her 1 week after biopsy."   I tried calling patient multiple times, but there was no answer. I left a voicemail to call back.   Biopsy order has been sent to central scheduling. We will contact her when it is scheduled and give her appt details.

## 2019-09-30 NOTE — Progress Notes (Signed)
Tumor Board Documentation  Kimberly [name] was presented by Dr Tasia Catchings at our Tumor Board on 09/30/2019, which included representatives from medical oncology, radiation oncology, navigation, pathology, radiology, surgical, surgical oncology, internal medicine, pharmacy, genetics, palliative care, research.  Kimberly [name] currently presents as a current patient, for discussion with history of the following treatments: surgical intervention(s), neoadjuvant chemoradiation.  Additionally, we reviewed previous medical and familial history, history of present illness, and recent lab results along with all available histopathologic and imaging studies. The tumor board considered available treatment options and made the following recommendations: Biopsy US Guided Core biopsy  The following procedures/referrals were also placed: No orders of the defined types were placed in this encounter.   Clinical Trial Status: not discussed   Staging used:    National site-specific guidelines   were discussed with respect to the case.  Tumor board is a meeting of clinicians from various specialty areas who evaluate and discuss patients for whom a multidisciplinary approach is being considered. Final determinations in the plan of care are those of the provider(s). The responsibility for follow up of recommendations given during tumor board is that of the provider.   Today's extended care, comprehensive team conference, Kimberly [name] was not present for the discussion and was not examined.   Multidisciplinary Tumor Board is a multidisciplinary case peer review process.  Decisions discussed in the Multidisciplinary Tumor Board reflect the opinions of the specialists present at the conference without having examined the patient.  Ultimately, treatment and diagnostic decisions rest with the primary provider(s) and the patient.

## 2019-10-04 ENCOUNTER — Other Ambulatory Visit: Payer: Self-pay | Admitting: Internal Medicine

## 2019-10-05 ENCOUNTER — Ambulatory Visit: Admission: RE | Admit: 2019-10-05 | Payer: Medicare HMO | Source: Ambulatory Visit | Admitting: *Deleted

## 2019-10-05 ENCOUNTER — Other Ambulatory Visit: Payer: Self-pay | Admitting: Oncology

## 2019-10-05 ENCOUNTER — Encounter: Payer: Self-pay | Admitting: Family Medicine

## 2019-10-05 ENCOUNTER — Ambulatory Visit (INDEPENDENT_AMBULATORY_CARE_PROVIDER_SITE_OTHER): Payer: Medicare HMO | Admitting: Family Medicine

## 2019-10-05 ENCOUNTER — Ambulatory Visit
Admission: RE | Admit: 2019-10-05 | Discharge: 2019-10-05 | Disposition: A | Discharge status: Home / Self Care | Payer: Medicare HMO | Source: Ambulatory Visit | Attending: Family Medicine | Admitting: Family Medicine

## 2019-10-05 ENCOUNTER — Other Ambulatory Visit: Payer: Self-pay

## 2019-10-05 ENCOUNTER — Other Ambulatory Visit: Payer: Self-pay | Admitting: Internal Medicine

## 2019-10-05 VITALS — BP 150/80 | HR 91 | Temp 96.9°F | Resp 16 | Wt 164.0 lb

## 2019-10-05 DIAGNOSIS — C184 Malignant neoplasm of transverse colon: Secondary | ICD-10-CM

## 2019-10-05 DIAGNOSIS — R0781 Pleurodynia: Secondary | ICD-10-CM | POA: Diagnosis not present

## 2019-10-05 DIAGNOSIS — B029 Zoster without complications: Secondary | ICD-10-CM | POA: Diagnosis not present

## 2019-10-05 DIAGNOSIS — K6389 Other specified diseases of intestine: Secondary | ICD-10-CM

## 2019-10-05 DIAGNOSIS — S2232XA Fracture of one rib, left side, initial encounter for closed fracture: Secondary | ICD-10-CM | POA: Diagnosis not present

## 2019-10-05 MED ORDER — VALACYCLOVIR HCL 1 G PO TABS: 1000.0000 mg | ORAL_TABLET | Freq: Three times a day (TID) | ORAL | 0 refills | Status: AC

## 2019-10-05 NOTE — Progress Notes (Signed)
Patient: Kimberly [name] Female    DOB: Dec 29, 1945   74 y.o.   MRN: [id number] Visit Date: 10/05/2019  Today's Provider: Lelon Huh, MD   Chief Complaint  Patient presents with  . Neck Pain    x 5 days   Subjective:     Neck Pain  This is a new problem. Episode onset: 5 days ago. The problem occurs intermittently. The problem has been gradually worsening. The pain is associated with nothing. The pain is present in the occipital region. The quality of the pain is described as shooting and burning. Pertinent negatives include no fever or weakness. Associated symptoms comments: Rash on the back of her neck along with a knot.    Rib Pain: Patient complains of left side rib pain for the past 10 days. She states pain started after her grandson tried to lift her up. Pain worsens with movement.   Allergies  Allergen Reactions  . Betadine [Povidone Iodine] Other (See Comments)    Topical iodine she states "if you put it in an open sore it will cause a eating ulcer."  . Iodine Other (See Comments)    Patient states "it gives me infections"  . Other Nausea And Vomiting    Antihystamines  . Sinus Formula [Cholestatin] Nausea Only     Current Outpatient Medications:  .  bisacodyl (BISACODYL) 5 MG EC tablet, Take 2 tablets (10mg ) by mouth the day before your procedure between 1pm-3pm., Disp: 2 tablet, Rfl: 0 .  budesonide-formoterol (SYMBICORT) 80-4.5 MCG/ACT inhaler, Inhale 2 puffs into the lungs 2 (two) times daily., Disp: 3 Inhaler, Rfl: 3 .  cholecalciferol (VITAMIN D) 1000 units tablet, Take 1,000 Units by mouth daily., Disp: , Rfl:  .  docusate sodium (COLACE) 100 MG capsule, Take 100 mg by mouth daily., Disp: , Rfl:  .  gabapentin (NEURONTIN) 400 MG capsule, Take 1 capsule (400 mg total) by mouth 3 (three) times daily., Disp: 270 capsule, Rfl: 1 .  HYDROcodone-acetaminophen (NORCO) 7.5-325 MG tablet, Take 1-2 tablets by mouth every 6 (six) hours as needed for moderate pain.,  Disp: 120 tablet, Rfl: 0 .  meloxicam (MOBIC) 15 MG tablet, TAKE 1 TABLET EVERY DAY AS NEEDED, Disp: 90 tablet, Rfl: 3 .  omeprazole (PRILOSEC) 20 MG capsule, Take 1 capsule (20 mg total) by mouth daily., Disp: 90 capsule, Rfl: 4 .  ondansetron (ZOFRAN) 4 MG tablet, Take 0.5-1 tablets (2-4 mg total) by mouth every 8 (eight) hours as needed for nausea or vomiting., Disp: 20 tablet, Rfl: 0 .  pravastatin (PRAVACHOL) 40 MG tablet, TAKE 1 TABLET EVERY DAY, Disp: 90 tablet, Rfl: 4 .  Probiotic Product (PROBIOTIC DAILY PO), Take 1 capsule by mouth daily. , Disp: , Rfl:  .  montelukast (SINGULAIR) 10 MG tablet, TAKE 1 TABLET BY MOUTH EVERY DAY, Disp: 90 tablet, Rfl: 0 .  Triamcinolone Acetonide (NASACORT ALLERGY 24HR NA), Place 2 sprays into the nose daily as needed (allergies). , Disp: , Rfl:   Review of Systems  Constitutional: Negative for appetite change, chills, fatigue and fever.  Respiratory: Negative for chest tightness and shortness of breath.   Cardiovascular: Negative for palpitations.  Gastrointestinal: Negative for abdominal pain, nausea and vomiting.  Musculoskeletal: Positive for neck pain.       Left rib pain x 2 weeks  Skin: Positive for rash.  Neurological: Negative for dizziness and weakness.    Social History   Tobacco Use  . Smoking status: Former Smoker  Packs/day: 0.25    Years: 55.00    Pack years: 13.75    Types: Cigarettes    Quit date: 05/21/2017    Years since quitting: 2.3  . Smokeless tobacco: Never Used  . Tobacco comment: started age 43 1/2 to 1 ppd  Substance Use Topics  . Alcohol use: Yes    Alcohol/week: 0.0 standard drinks    Comment: occasionally drinks beer      Objective:   BP (!) 150/80 (BP Location: Right Arm, Cuff Size: Large)   Pulse 91   Temp (!) 96.9 F (36.1 C) (Temporal)   Resp 16   Wt 164 lb (74.4 kg)   SpO2 94% Comment: room air  BMI 28.15 kg/m  Vitals:   10/05/19 1602 10/05/19 1604  BP: (!) 160/70 (!) 150/80  Pulse: 91     Resp: 16   Temp: (!) 96.9 F (36.1 C)   TempSrc: Temporal   SpO2: 94%   Weight: 164 lb (74.4 kg)   Body mass index is 28.15 kg/m.   Physical Exam  Two right posterior cervical firm lymph nodules. Several small vesicular lesions with underlying erythema left of midline on back of neck consistent with zoster.   Tender right lateral ribs, slightly swollen.     Assessment & Plan    1. Herpes zoster without complication  - valACYclovir (VALTREX) 1000 MG tablet; Take 1 tablet (1,000 mg total) by mouth every 8 (eight) hours for 7 days.  Dispense: 21 tablet; Refill: 0  2. Rib pain on left side - DG Ribs Unilateral Left; Future   The entirety of the information documented in the History of Present Illness, Review of Systems and Physical Exam were personally obtained by me. Portions of this information were initially documented by Meyer Cory, CMA and reviewed by me for thoroughness and accuracy.      Lelon Huh, MD  [facility]

## 2019-10-05 NOTE — Telephone Encounter (Signed)
[place] bx scheduled for 10/07/19 arrive @ 10:00 for 11:00 appt.  Patient aware.

## 2019-10-05 NOTE — Patient Instructions (Signed)
.   Please review the attached list of medications and notify my office if there are any errors.   Kimberly [name] to the [facility] on [facility] for left rib Xray

## 2019-10-06 ENCOUNTER — Telehealth: Payer: Self-pay

## 2019-10-06 ENCOUNTER — Other Ambulatory Visit: Payer: Self-pay | Admitting: Radiology

## 2019-10-06 DIAGNOSIS — M8000XA Age-related osteoporosis with current pathological fracture, unspecified site, initial encounter for fracture: Secondary | ICD-10-CM

## 2019-10-06 MED ORDER — ALENDRONATE SODIUM 70 MG PO TABS: 70.0000 mg | ORAL_TABLET | ORAL | 11 refills | Status: DC

## 2019-10-06 NOTE — Telephone Encounter (Signed)
Follow up has been scheduled on 1/14.

## 2019-10-06 NOTE — Telephone Encounter (Signed)
[facility]. Please let Kimberly [name] know if she has any trouble filling prescription or taking medication.

## 2019-10-06 NOTE — Telephone Encounter (Signed)
Message sent by Anne Hahn: "Pt called and said she needs her biopsy postponed that she has shingles."  Per Dr. Tasia Catchings ok to reschedule to 1-2 weeks. Spoke to Ad[place] in [place] and she will reschedule as requested.

## 2019-10-06 NOTE — Telephone Encounter (Signed)
Patient advised of results. Patient verbalized understanding. Patient states she thinks she may have taken Fosamax in the past. She doesn't remember whether she had any intolerances to it.   Pharmacy: [facility] [address] [place].)

## 2019-10-06 NOTE — Telephone Encounter (Signed)
-----   Message from Birdie Sons, MD sent at 10/06/2019 10:58 AM EST ----- Xray shows mild fracture of left seventh rib. No treatment for this except to avoid any stress on rib cage or heavy lifting. Usually helps to apply heat 3-4 times a day  Considering history of borderline osteoporosis. She is at high risk for hip and vertebral fractures. She needs to take osteoporosis medication.  Has she ever taking Fosamax or Actonel before?

## 2019-10-07 ENCOUNTER — Ambulatory Visit: Admission: RE | Admit: 2019-10-07 | Payer: Medicare HMO | Source: Ambulatory Visit

## 2019-10-08 NOTE — Telephone Encounter (Signed)
Patient advised and verbally voiced understanding.   Patient wants to know if the Valtrex that was prescribed for Shingles causes headaches? Patient states she started having headaches this morning. She wants to know if that is a normal side effect. Please advise.

## 2019-10-08 NOTE — Telephone Encounter (Signed)
Not common, but is a possible side effect of Valtrex. Shingles can also cause headaches. I would recommend finishing Valtrex unless the headaches are severe. She can take tylenol for the headaches if she like.

## 2019-10-08 NOTE — Telephone Encounter (Signed)
Patient advised.

## 2019-10-12 NOTE — Telephone Encounter (Signed)
spoke to Kimberly [name] in centralized scheduling and she states that per IR nurse "patient having had shingles will need to be scheduled about 3 weeks out."  They will work on it and get back to [place] regarding appt.

## 2019-10-12 NOTE — Telephone Encounter (Signed)
Patient's biopsy has been rescheduled to 1/28 @ 10:30. Please schedule patient MD visit 1 week after. I will call her with appt details once scheduled. Thanks.

## 2019-10-14 ENCOUNTER — Ambulatory Visit: Payer: Medicare HMO | Admitting: Oncology

## 2019-10-14 DIAGNOSIS — S72046A Nondisplaced fracture of base of neck of unspecified femur, initial encounter for closed fracture: Secondary | ICD-10-CM | POA: Diagnosis not present

## 2019-10-14 DIAGNOSIS — R062 Wheezing: Secondary | ICD-10-CM | POA: Diagnosis not present

## 2019-10-14 DIAGNOSIS — J441 Chronic obstructive pulmonary disease with (acute) exacerbation: Secondary | ICD-10-CM | POA: Diagnosis not present

## 2019-10-14 NOTE — Telephone Encounter (Signed)
Patient aware.

## 2019-10-27 ENCOUNTER — Other Ambulatory Visit: Payer: Self-pay | Admitting: Radiology

## 2019-10-28 ENCOUNTER — Ambulatory Visit
Admission: RE | Admit: 2019-10-28 | Discharge: 2019-10-28 | Disposition: A | Discharge status: Home / Self Care | Payer: Medicare HMO | Source: Ambulatory Visit | Attending: Oncology | Admitting: Oncology

## 2019-10-28 ENCOUNTER — Other Ambulatory Visit: Payer: Self-pay

## 2019-10-28 DIAGNOSIS — K6389 Other specified diseases of intestine: Secondary | ICD-10-CM | POA: Insufficient documentation

## 2019-10-28 DIAGNOSIS — R222 Localized swelling, mass and lump, trunk: Secondary | ICD-10-CM | POA: Diagnosis not present

## 2019-10-28 DIAGNOSIS — C189 Malignant neoplasm of colon, unspecified: Secondary | ICD-10-CM | POA: Insufficient documentation

## 2019-10-28 DIAGNOSIS — C184 Malignant neoplasm of transverse colon: Secondary | ICD-10-CM | POA: Diagnosis not present

## 2019-10-28 DIAGNOSIS — C772 Secondary and unspecified malignant neoplasm of intra-abdominal lymph nodes: Secondary | ICD-10-CM | POA: Diagnosis not present

## 2019-10-28 MED ORDER — MIDAZOLAM HCL 5 MG/5ML IJ SOLN
INTRAMUSCULAR | Status: AC
  Filled 2019-10-28: qty 5

## 2019-10-28 MED ORDER — FENTANYL CITRATE (PF) 100 MCG/2ML IJ SOLN
INTRAMUSCULAR | Status: AC
  Filled 2019-10-28: qty 2

## 2019-10-28 MED ORDER — MIDAZOLAM HCL 5 MG/5ML IJ SOLN
INTRAMUSCULAR | Status: AC | PRN
  Administered 2019-10-28: 1 mg via INTRAVENOUS

## 2019-10-28 MED ORDER — SODIUM CHLORIDE 0.9 % IV SOLN: INTRAVENOUS | Status: DC

## 2019-10-28 NOTE — Procedures (Signed)
Interventional Radiology Procedure Note  Procedure: US Guided Biopsy of abdominal wall mass  Complications: None  Estimated Blood Loss: < 10 mL  Findings: 18 G core biopsy of right abdominal wall/peritoneal mass performed under US guidance.  Four core samples obtained and sent to Pathology.  Venetia Night. Kathlene Cote, M.D Pager:  [phone]

## 2019-10-28 NOTE — Discharge Instructions (Signed)
Moderate Conscious Sedation, Adult, Care After These instructions provide you with information about caring for yourself after your procedure. Your health care provider may also give you more specific instructions. Your treatment has been planned according to current medical practices, but problems sometimes occur. Call your health care provider if you have any problems or questions after your procedure. What can I expect after the procedure? After your procedure, it is common:  To feel sleepy for several hours.  To feel clumsy and have poor balance for several hours.  To have poor judgment for several hours.  To vomit if you eat too soon. Follow these instructions at home: For at least 24 hours after the procedure:   Do not: ? Participate in activities where you could fall or become injured. ? Drive. ? Use heavy machinery. ? Drink alcohol. ? Take sleeping pills or medicines that cause drowsiness. ? Make important decisions or sign legal documents. ? Take care of children on your own.  Rest. Eating and drinking  Follow the diet recommended by your health care provider.  If you vomit: ? Drink water, juice, or soup when you can drink without vomiting. ? Make sure you have little or no nausea before eating solid foods. General instructions  Have a responsible adult stay with you until you are awake and alert.  Take over-the-counter and prescription medicines only as told by your health care provider.  If you smoke, do not smoke without supervision.  Keep all follow-up visits as told by your health care provider. This is important. Contact a health care provider if:  You keep feeling nauseous or you keep vomiting.  You feel light-headed.  You develop a rash.  You have a fever. Get help right away if:  You have trouble breathing. This information is not intended to replace advice given to you by your health care provider. Make sure you discuss any questions you have  with your health care provider. Document Revised: 08/29/2017 Document Reviewed: 01/06/2016 Elsevier Patient Education  2020 Elsevier Inc. Needle Biopsy, Care After These instructions tell you how to care for yourself after your procedure. Your doctor may also give you more specific instructions. Call your doctor if you have any problems or questions. What can I expect after the procedure? After the procedure, it is common to have:  Soreness.  Bruising.  Mild pain. Follow these instructions at home:   Return to your normal activities as told by your doctor. Ask your doctor what activities are safe for you.  Take over-the-counter and prescription medicines only as told by your doctor.  Wash your hands with soap and water before you change your bandage (dressing). If you cannot use soap and water, use hand sanitizer.  Follow instructions from your doctor about: ? How to take care of your puncture site. ? When and how to change your bandage. ? When to remove your bandage.  Check your puncture site every day for signs of infection. Watch for: ? Redness, swelling, or pain. ? Fluid or blood. ? Pus or a bad smell. ? Warmth.  Do not take baths, swim, or use a hot tub until your doctor approves. Ask your doctor if you may take showers. You may only be allowed to take sponge baths.  Keep all follow-up visits as told by your doctor. This is important. Contact a doctor if you have:  A fever.  Redness, swelling, or pain at the puncture site, and it lasts longer than a few days.  Fluid,   blood, or pus coming from the puncture site.  Warmth coming from the puncture site. Get help right away if:  You have a lot of bleeding from the puncture site. Summary  After the procedure, it is common to have soreness, bruising, or mild pain at the puncture site.  Check your puncture site every day for signs of infection, such as redness, swelling, or pain.  Get help right away if you have  severe bleeding from your puncture site. This information is not intended to replace advice given to you by your health care provider. Make sure you discuss any questions you have with your health care provider. Document Revised: 09/29/2017 Document Reviewed: 09/29/2017 Elsevier Patient Education  2020 Elsevier Inc.  

## 2019-10-29 ENCOUNTER — Other Ambulatory Visit: Payer: Self-pay | Admitting: Family Medicine

## 2019-10-29 DIAGNOSIS — M5136 Other intervertebral disc degeneration, lumbar region: Secondary | ICD-10-CM

## 2019-10-29 MED ORDER — HYDROCODONE-ACETAMINOPHEN 7.5-325 MG PO TABS: 1.0000 | ORAL_TABLET | Freq: Four times a day (QID) | ORAL | 0 refills | Status: DC | PRN

## 2019-10-30 ENCOUNTER — Encounter: Payer: Self-pay | Admitting: Family Medicine

## 2019-11-01 ENCOUNTER — Other Ambulatory Visit: Payer: Self-pay | Admitting: Anatomic Pathology & Clinical Pathology

## 2019-11-01 LAB — SURGICAL PATHOLOGY

## 2019-11-03 ENCOUNTER — Encounter: Payer: Self-pay | Admitting: Family Medicine

## 2019-11-03 ENCOUNTER — Ambulatory Visit (INDEPENDENT_AMBULATORY_CARE_PROVIDER_SITE_OTHER): Payer: Medicare HMO | Admitting: Family Medicine

## 2019-11-03 ENCOUNTER — Other Ambulatory Visit: Payer: Self-pay

## 2019-11-03 VITALS — BP 129/83 | HR 96 | Temp 96.9°F | Resp 18 | Wt 162.0 lb

## 2019-11-03 DIAGNOSIS — E559 Vitamin D deficiency, unspecified: Secondary | ICD-10-CM

## 2019-11-03 DIAGNOSIS — I2510 Atherosclerotic heart disease of native coronary artery without angina pectoris: Secondary | ICD-10-CM | POA: Diagnosis not present

## 2019-11-03 NOTE — Progress Notes (Signed)
Patient: Kimberly [name] Female    DOB: 1946/05/21   74 y.o.   MRN: [id number] Visit Date: 11/03/2019  Today's Provider: Lelon Huh, MD   Chief Complaint  Patient presents with  . Hyperlipidemia   Subjective:     HPI  Follow up for Vitamin D Deficiency:  The patient was last seen for this 6 months ago. Changes made at last visit include doubling the dose of Vitamin D supplements. However she states she is actually taking 50,000 units prescription  vitamin D once a week and calcium with vitamin d supplement once a day.  She feels that condition is Unchanged. She is not having side effects.   ------------------------------------------------------------------------------------  Lipid/Cholesterol, Follow-up:   Last seen for this 6 months ago.  Management changes since that visit include none. . Last Lipid Panel:    Component Value Date/Time   CHOL 167 04/28/2019 1038   TRIG 197 (H) 04/28/2019 1038   HDL 39 (L) 04/28/2019 1038   CHOLHDL 4.3 04/28/2019 1038   [place] 89 04/28/2019 1038    Risk factors for vascular disease include hypercholesterolemia  She reports good compliance with treatment. She is not having side effects.  Current symptoms include none and have been stable. Weight trend: fluctuating a bit Prior visit with dietician: no Current diet: well balanced Current exercise: walking  Wt Readings from Last 3 Encounters:  11/03/19 162 lb (73.5 kg)  10/28/19 156 lb (70.8 kg)  10/05/19 164 lb (74.4 kg)    -------------------------------------------------------------------  Follow up for Herpes zoster without complication  The patient was last seen for this 4 weeks ago. Changes made at last visit include prescribing Valtrex.  She reports good compliance with treatment. She feels that condition is Improved. Patient states the shingles still itch. Patient denies any pain. She is not having side effects.    ------------------------------------------------------------------------------------  Allergies  Allergen Reactions  . Betadine [Povidone Iodine] Other (See Comments)    Topical iodine she states "if you put it in an open sore it will cause a eating ulcer."  . Iodine Other (See Comments)    Patient states "it gives me infections"  . Other Nausea And Vomiting    Antihystamines  . Sinus Formula [Cholestatin] Nausea Only     Current Outpatient Medications:  .  alendronate (FOSAMAX) 70 MG tablet, Take 1 tablet (70 mg total) by mouth every 7 (seven) days. Take with a full glass of water on an empty stomach., Disp: 4 tablet, Rfl: 11 .  budesonide-formoterol (SYMBICORT) 80-4.5 MCG/ACT inhaler, Inhale 2 puffs into the lungs 2 (two) times daily., Disp: 3 Inhaler, Rfl: 3 .  calcium citrate-vitamin D (CITRACAL+D) 315-200 MG-UNIT tablet, Take 1 tablet by mouth daily., Disp: , Rfl:  .  ergocalciferol (VITAMIN D2) 1.25 MG (50000 UT) capsule, Take 50,000 Units by mouth once a week., Disp: , Rfl:  .  gabapentin (NEURONTIN) 400 MG capsule, Take 1 capsule (400 mg total) by mouth 3 (three) times daily., Disp: 270 capsule, Rfl: 1 .  HYDROcodone-acetaminophen (NORCO) 7.5-325 MG tablet, Take 1-2 tablets by mouth every 6 (six) hours as needed for moderate pain., Disp: 120 tablet, Rfl: 0 .  meloxicam (MOBIC) 15 MG tablet, TAKE 1 TABLET EVERY DAY AS NEEDED, Disp: 90 tablet, Rfl: 3 .  Multiple Vitamins-Minerals (WOMENS MULTI VITAMIN & MINERAL PO), Take 1 tablet by mouth daily., Disp: , Rfl:  .  omeprazole (PRILOSEC) 20 MG capsule, Take 1 capsule (20 mg total) by mouth daily.,  Disp: 90 capsule, Rfl: 4 .  bisacodyl (BISACODYL) 5 MG EC tablet, Take 2 tablets (10mg ) by mouth the day before your procedure between 1pm-3pm., Disp: 2 tablet, Rfl: 0 .  cholecalciferol (VITAMIN D) 1000 units tablet, Take 1,000 Units by mouth daily., Disp: , Rfl:  .  docusate sodium (COLACE) 100 MG capsule, Take 100 mg by mouth daily.,  Disp: , Rfl:  .  montelukast (SINGULAIR) 10 MG tablet, TAKE 1 TABLET BY MOUTH EVERY DAY, Disp: 90 tablet, Rfl: 0 .  ondansetron (ZOFRAN) 4 MG tablet, Take 0.5-1 tablets (2-4 mg total) by mouth every 8 (eight) hours as needed for nausea or vomiting., Disp: 20 tablet, Rfl: 0 .  pravastatin (PRAVACHOL) 40 MG tablet, TAKE 1 TABLET EVERY DAY, Disp: 90 tablet, Rfl: 4 .  Probiotic Product (PROBIOTIC DAILY PO), Take 1 capsule by mouth daily. , Disp: , Rfl:  .  Triamcinolone Acetonide (NASACORT ALLERGY 24HR NA), Place 2 sprays into the nose daily as needed (allergies). , Disp: , Rfl:   Review of Systems  Constitutional: Negative for appetite change, chills, fatigue and fever.  Respiratory: Negative for chest tightness and shortness of breath.   Cardiovascular: Negative for chest pain and palpitations.  Gastrointestinal: Negative for abdominal pain, nausea and vomiting.  Skin:       Itching on the posterior neck and back of scalp  Neurological: Negative for dizziness and weakness.    Social History   Tobacco Use  . Smoking status: Former Smoker    Packs/day: 0.25    Years: 55.00    Pack years: 13.75    Types: Cigarettes    Quit date: 05/21/2017    Years since quitting: 2.4  . Smokeless tobacco: Never Used  . Tobacco comment: started age 34 1/2 to 1 ppd  Substance Use Topics  . Alcohol use: Yes    Alcohol/week: 0.0 standard drinks    Comment: occasionally drinks beer      Objective:   BP 129/83 (BP Location: Right Arm, Patient Position: Bed low/side rails up, Cuff Size: Large)   Pulse 96   Temp (!) 96.9 F (36.1 C) (Temporal)   Resp 18   Wt 162 lb (73.5 kg)   SpO2 95% Comment: room air  BMI 27.81 kg/m  Vitals:   11/03/19 0931  BP: 129/83  Pulse: 96  Resp: 18  Temp: (!) 96.9 F (36.1 C)  TempSrc: Temporal  SpO2: 95%  Weight: 162 lb (73.5 kg)  Body mass index is 27.81 kg/m.   Physical Exam   General: Appearance:     Well developed, well nourished female in no acute  distress  Eyes:    PERRL, conjunctiva/corneas clear, EOM's intact       Lungs:     Clear to auscultation bilaterally, respirations unlabored  Heart:    Normal heart rate. Normal rhythm. No murmurs, rubs, or gallops.   MS:   All extremities are intact.   Neurologic:   Awake, alert, oriented x 3. No apparent focal neurological           defect.          Assessment & Plan    1. Atherosclerosis of coronary artery of native heart without angina pectoris, unspecified vessel or lesion type Asymptomatic. Compliant with medication.  Continue aggressive risk factor modification. Doing well with pravastatin - Lipid panel  2. Vitamin D deficiency Patient reports she is taking weekly vitamin d supplement, however this was last prescribed by me in 2017. Unclear if  she is taking daily vitamin d supplement aside from her calcium with d supplement and daily multivitamin. She does have active for weekly alendronate.  - VITAMIN D 25 Hydroxy (Vit-D Deficiency, Fractures)  The entirety of the information documented in the History of Present Illness, Review of Systems and Physical Exam were personally obtained by me. Portions of this information were initially documented by Meyer Cory, CMA and reviewed by me for thoroughness and accuracy.      Lelon Huh, MD  [facility]

## 2019-11-04 ENCOUNTER — Telehealth: Payer: Self-pay

## 2019-11-04 LAB — LIPID PANEL
Chol/HDL Ratio: 3.6 ratio (ref 0.0–4.4)
Cholesterol, Total: 155 mg/dL (ref 100–199)
HDL: 43 mg/dL (ref >39)
LDL Chol Calc (NIH): 76 mg/dL (ref 0–99)
Triglycerides: 216 mg/dL — ABNORMAL HIGH (ref 0–149)
VLDL Cholesterol Cal: 36 mg/dL (ref 5–40)

## 2019-11-04 LAB — VITAMIN D 25 HYDROXY (VIT D DEFICIENCY, FRACTURES): Vit D, 25-Hydroxy: 17.4 ng/mL — ABNORMAL LOW (ref 30.0–100.0)

## 2019-11-04 NOTE — Telephone Encounter (Signed)
Omniseq test request sent to [facility] pathology

## 2019-11-04 NOTE — Telephone Encounter (Signed)
-----   Message from Earlie Server, MD sent at 11/04/2019 10:51 AM EST -----  Please send Omniseq on her specimen from 03/2017 right hemicolectomy. [phone]

## 2019-11-05 ENCOUNTER — Other Ambulatory Visit: Payer: Self-pay

## 2019-11-05 ENCOUNTER — Encounter: Payer: Self-pay | Admitting: Oncology

## 2019-11-05 ENCOUNTER — Inpatient Hospital Stay: Payer: Medicare HMO | Attending: Oncology | Admitting: Oncology

## 2019-11-05 ENCOUNTER — Telehealth: Payer: Self-pay

## 2019-11-05 VITALS — BP 149/90 | HR 76 | Temp 97.9°F | Resp 18 | Wt 162.0 lb

## 2019-11-05 DIAGNOSIS — Z800 Family history of malignant neoplasm of digestive organs: Secondary | ICD-10-CM | POA: Diagnosis not present

## 2019-11-05 DIAGNOSIS — Z79899 Other long term (current) drug therapy: Secondary | ICD-10-CM | POA: Insufficient documentation

## 2019-11-05 DIAGNOSIS — Z853 Personal history of malignant neoplasm of breast: Secondary | ICD-10-CM

## 2019-11-05 DIAGNOSIS — Z923 Personal history of irradiation: Secondary | ICD-10-CM | POA: Diagnosis not present

## 2019-11-05 DIAGNOSIS — C184 Malignant neoplasm of transverse colon: Secondary | ICD-10-CM | POA: Diagnosis not present

## 2019-11-05 DIAGNOSIS — Z87891 Personal history of nicotine dependence: Secondary | ICD-10-CM | POA: Diagnosis not present

## 2019-11-05 DIAGNOSIS — Z933 Colostomy status: Secondary | ICD-10-CM | POA: Insufficient documentation

## 2019-11-05 DIAGNOSIS — Z86010 Personal history of colonic polyps: Secondary | ICD-10-CM | POA: Insufficient documentation

## 2019-11-05 DIAGNOSIS — Z888 Allergy status to other drugs, medicaments and biological substances status: Secondary | ICD-10-CM | POA: Insufficient documentation

## 2019-11-05 DIAGNOSIS — Z9221 Personal history of antineoplastic chemotherapy: Secondary | ICD-10-CM | POA: Diagnosis not present

## 2019-11-05 DIAGNOSIS — R590 Localized enlarged lymph nodes: Secondary | ICD-10-CM | POA: Insufficient documentation

## 2019-11-05 DIAGNOSIS — Z7189 Other specified counseling: Secondary | ICD-10-CM | POA: Diagnosis not present

## 2019-11-05 DIAGNOSIS — A0472 Enterocolitis due to Clostridium difficile, not specified as recurrent: Secondary | ICD-10-CM | POA: Insufficient documentation

## 2019-11-05 DIAGNOSIS — Z8249 Family history of ischemic heart disease and other diseases of the circulatory system: Secondary | ICD-10-CM | POA: Diagnosis not present

## 2019-11-05 DIAGNOSIS — K118 Other diseases of salivary glands: Secondary | ICD-10-CM | POA: Diagnosis not present

## 2019-11-05 DIAGNOSIS — I700 Atherosclerosis of aorta: Secondary | ICD-10-CM | POA: Insufficient documentation

## 2019-11-05 DIAGNOSIS — K56609 Unspecified intestinal obstruction, unspecified as to partial versus complete obstruction: Secondary | ICD-10-CM | POA: Insufficient documentation

## 2019-11-05 DIAGNOSIS — R634 Abnormal weight loss: Secondary | ICD-10-CM | POA: Diagnosis not present

## 2019-11-05 DIAGNOSIS — Z833 Family history of diabetes mellitus: Secondary | ICD-10-CM | POA: Diagnosis not present

## 2019-11-05 DIAGNOSIS — Z836 Family history of other diseases of the respiratory system: Secondary | ICD-10-CM | POA: Insufficient documentation

## 2019-11-05 DIAGNOSIS — D72821 Monocytosis (symptomatic): Secondary | ICD-10-CM

## 2019-11-05 MED ORDER — VITAMIN D3 50 MCG (2000 UT) PO TABS: 2000.0000 [IU] | ORAL_TABLET | Freq: Every day | ORAL | Status: DC

## 2019-11-05 NOTE — Progress Notes (Signed)
[place] [facility] Follow up  Visit:  Patient Care Team: Birdie Sons, MD as PCP - General (Family Medicine) Earlie Server, MD as Consulting Physician (Oncology) Noreene Filbert, MD as Referring Physician (Radiation Oncology) Jules Husbands, MD as Consulting Physician (General Surgery) Benedetto Goad, RN as Registered Nurse Cathi Roan, [facility] (Pharmacist) Clent Jacks, RN as Oncology Nurse Navigator  PURPOSE OF VISIT: Adjuvant chemotherapy for colon cancer  HISTORY OF PRESENTING ILLNESS: Kimberly [name] 74 y.o. female with PMH listed as below presents for follow up for the management of Stage IIIB Colon Cancer Patient reports a remote history of breast cancer s/p lumpectomy and radiation treatments.   Patient had been having abdominal pain, weight loss and bloating.  CT scan showed partial obstruction at the level of transverse colon and ileocolic mesenteric adenopathy. She was prepared for a colonoscopy on 04/15/2017. She started to have worsened abdominal pain, nausea vomiting, unable to maintain oral intake and was sent to ED. She was found to have bowel obstruction and underwent  exploratory laparotomy with transverse colectomy, with end colostomy and mucous fistula formation for obstructing colon lesion. Differential diagnosis prior to surgery was metastatic breast cancer in colon vs colon cancer. The patient did have a colonoscopy 2 years ago without the mass being present at that time but did have 2 small polyps in the transverse colon that were removed.  04/15/2017 Patient underwent transverse colon resection.  Pathology showed T4aN1 moderate differentiated adenocarcinoma, Grade 2, tumor invades visceral peritoneum, LVI present, negative margin, 3/16 lymph nodes involved with cancer. Low probability of MSI-H.   # Genetic test INVITAE came back negative. No pathogenetic sequence variants or deletions/duplications identified. Results was scanned to [employer] (a copy of the  report was provided to patient and her daughter)  # # baseline CEA is 2.1 # colostomy reversal on 06/25/2018, subsequently developed C. difficile colitis x 2.  Multiple abdominal CT abdomen and pelvis for obtained, not listed here #09/01/2018 CT abdomen pelvis showed abdominal wall abscess,  percutaneous drain placement .treatment with antibiotics with Augmentin 10 days course.  #09/14/2018 CT abdomen/pelvis showed resolution of abscess, no intraabdominal abscess.  Mediport was discontinued. # 08/13/2019 colonoscopy showed localized area of mildly thickened folds of the mucosa was found in the sigmoid colon. Biopsy pathology showed benign mucosa with lymphoid aggregates and focal minimal inflammation. No dysplasia.   TREATMENT:  04/15/2017 transverse colon resection 05/27/2017- 11/17/2017 adjuvant FOLFOX, Oxaliplatin was discontinued after 3 cycles due to worsening of pre-existing neuropathy. Finished another 9 cycles of 5-FU.  March -April 2019 adjuvant RT.  06/25/2018 Colostomy Reversal.    INTERVAL HISTORY 74 y.o. female  presents for follow-up of Stage IIIB colon cancer. During interval PET scan was obtained. 09/21/2019 PET scan showed 2 small right abdominal mesenteric soft tissue nodule which showed no significant change in size compared to recent CT but hypermetabolic.  Peritoneal metastatic disease cannot be excluded. 1.1 cm hypermetabolic nodule in the right parotid gland suspicious for primary parotid neoplasm.  08/27/2020, CT-guided biopsy of abdominal wall mass was obtained. Results showed metastatic adenocarcinoma, compatible with colon primary.  Not enough tissue for ancillary testing. Patient presents to discuss biopsy results and further management plan.  Patient was accompanied by her daughter. She has no new complaints.   Review of Systems  Constitutional: Negative for appetite change, chills, fatigue, fever and unexpected weight change.  HENT:   Negative for hearing loss,  lump/mass, nosebleeds and voice change.   Eyes:  Negative for eye problems.  Respiratory: Negative for chest tightness, cough and shortness of breath.   Cardiovascular: Negative for chest pain and leg swelling.  Gastrointestinal: Negative for abdominal distention, abdominal pain, blood in stool, constipation, diarrhea and nausea.  Endocrine: Negative for hot flashes.  Genitourinary: Negative for difficulty urinating, dysuria and frequency.   Musculoskeletal: Negative for arthralgias, gait problem and myalgias.  Skin: Negative for itching, rash and wound.  Neurological: Negative for dizziness, extremity weakness, gait problem and headaches.       Chronic numbness and tingling of fingertips and toes.  Hematological: Negative for adenopathy. Does not bruise/bleed easily.  Psychiatric/Behavioral: Negative for confusion, decreased concentration and depression. The patient is not nervous/anxious.       MEDICAL HISTORY: Past Medical History:  Diagnosis Date  . Breast cancer, left ([place]) 2004   Lumpectomy and rad tx's.  . Chronic back pain   . Colon cancer ([place]) 2018  . COPD (chronic obstructive pulmonary disease) ([place])   . Degenerative disc disease, lumbar 04/2013  . Family history of adverse reaction to anesthesia    son arrested after anesthesia  . GERD (gastroesophageal reflux disease)   . Iron deficiency anemia 05/27/2017  . Personal history of chemotherapy 2018   Colon  . Personal history of radiation therapy    Breast 2004  . Personal history of radiation therapy 2018   Colon  . Postprocedural intraabdominal abscess 09/01/2018  . Small bowel obstruction ([place]) 04/16/2017  . Wears dentures    full upper    SURGICAL HISTORY: Past Surgical History:  Procedure Laterality Date  . APPENDECTOMY  1965  . BREAST EXCISIONAL BIOPSY Left 08/01/2003   lumpectomy rad 11/04-2/28/2005  . BREAST SURGERY    . CESAREAN SECTION     x3  . COLON SURGERY    . COLONOSCOPY W/ POLYPECTOMY    .  COLONOSCOPY WITH PROPOFOL N/A 04/09/2018   Procedure: COLONOSCOPY WITH PROPOFOL;  Surgeon: Virgel Manifold, MD;  Location: [facility];  Service: Gastroenterology;  Laterality: N/A;  . COLONOSCOPY WITH PROPOFOL N/A 08/13/2019   Procedure: COLONOSCOPY WITH PROPOFOL;  Surgeon: Virgel Manifold, MD;  Location: [facility];  Service: Endoscopy;  Laterality: N/A;  . COLOSTOMY TAKEDOWN N/A 06/25/2018   Procedure: COLOSTOMY TAKEDOWN;  Surgeon: Jules Husbands, MD;  Location: [facility];  Service: General;  Laterality: N/A;  . ESOPHAGOGASTRODUODENOSCOPY (EGD) WITH PROPOFOL N/A 04/04/2017   Procedure: ESOPHAGOGASTRODUODENOSCOPY (EGD) WITH PROPOFOL;  Surgeon: [name], MD;  Location: [facility];  Service: Endoscopy;  Laterality: N/A;  . FRACTURE SURGERY    . HIP FRACTURE SURGERY Left 01/25/2012  . JOINT REPLACEMENT    . LAPAROTOMY N/A 04/15/2017   Procedure: EXPLORATORY LAPAROTOMY;  Surgeon: Clayburn Pert, MD;  Location: [facility];  Service: General;  Laterality: N/A;  . NECK SURGERY  12/2011  . PARASTOMAL HERNIA REPAIR N/A 12/03/2017   Procedure: HERNIA REPAIR PARASTOMAL;  Surgeon: Jules Husbands, MD;  Location: [facility];  Service: General;  Laterality: N/A;  . PARASTOMAL HERNIA REPAIR N/A 06/25/2018   Procedure: HERNIA REPAIR PARASTOMAL;  Surgeon: Jules Husbands, MD;  Location: [facility];  Service: General;  Laterality: N/A;  . PORTACATH PLACEMENT Right 05/21/2017   Procedure: INSERTION PORT-A-CATH;  Surgeon: Clayburn Pert, MD;  Location: [facility];  Service: General;  Laterality: Right;  . WRIST FRACTURE SURGERY      SOCIAL HISTORY: Social History   Socioeconomic History  . Marital status: Divorced    Spouse name: Not on file  .  Number of children: 3  . Years of education: Not on file  . Highest education level: 7th grade  Occupational History  . Occupation: retired  Tobacco Use  . Smoking status: Former Smoker    Packs/day: 0.25    Years: 55.00    Pack years:  13.75    Types: Cigarettes    Quit date: 05/21/2017    Years since quitting: 2.4  . Smokeless tobacco: Never Used  . Tobacco comment: started age 25 1/3 to 1 ppd  Substance and Sexual Activity  . Alcohol use: Yes    Alcohol/week: 0.0 standard drinks    Comment: occasionally drinks beer  . Drug use: No  . Sexual activity: Never  Other Topics Concern  . Not on file  Social History Narrative   Lives at home alone   Social Determinants of Health   Financial Resource Strain: Medium Risk  . Difficulty of Paying Living Expenses: Somewhat hard  Food Insecurity:   . Worried About Charity fundraiser in the Last Year: Not on file  . Ran Out of Food in the Last Year: Not on file  Transportation Needs:   . Lack of Transportation (Medical): Not on file  . Lack of Transportation (Non-Medical): Not on file  Physical Activity: Inactive  . Days of Exercise per Week: 0 days  . Minutes of Exercise per Session: 0 min  Stress:   . Feeling of Stress : Not on file  Social Connections: Unknown  . Frequency of Communication with Friends and Family: Patient refused  . Frequency of Social Gatherings with Friends and Family: Patient refused  . Attends Religious Services: Patient refused  . Active Member of Clubs or Organizations: Patient refused  . Attends Archivist Meetings: Patient refused  . Marital Status: Patient refused  Intimate Partner Violence: Unknown  . Fear of Current or Ex-Partner: Patient refused  . Emotionally Abused: Patient refused  . Physically Abused: Patient refused  . Sexually Abused: Patient refused    FAMILY HISTORY Family History  Problem Relation Age of Onset  . Diabetes Mother        type 2  . Coronary artery disease Mother   . Deep vein thrombosis Mother   . Colon cancer Father   . Asthma Brother   . Diabetes Brother        type 2  . Breast cancer Neg Hx     ALLERGIES:  is allergic to betadine [povidone iodine]; iodine; other; and sinus formula  [cholestatin].  MEDICATIONS:  Current Outpatient Medications  Medication Sig Dispense Refill  . alendronate (FOSAMAX) 70 MG tablet Take 1 tablet (70 mg total) by mouth every 7 (seven) days. Take with a full glass of water on an empty stomach. 4 tablet 11  . budesonide-formoterol (SYMBICORT) 80-4.5 MCG/ACT inhaler Inhale 2 puffs into the lungs 2 (two) times daily. 3 Inhaler 3  . calcium citrate-vitamin D (CITRACAL+D) 315-200 MG-UNIT tablet Take 1 tablet by mouth daily.    . Cholecalciferol (VITAMIN D3) 50 MCG (2000 UT) TABS Take 2,000 Units by mouth daily.    Marland Kitchen docusate sodium (COLACE) 100 MG capsule Take 100 mg by mouth daily.    Marland Kitchen gabapentin (NEURONTIN) 400 MG capsule Take 1 capsule (400 mg total) by mouth 3 (three) times daily. 270 capsule 1  . HYDROcodone-acetaminophen (NORCO) 7.5-325 MG tablet Take 1-2 tablets by mouth every 6 (six) hours as needed for moderate pain. 120 tablet 0  . meloxicam (MOBIC) 15 MG tablet TAKE 1  TABLET EVERY DAY AS NEEDED 90 tablet 3  . montelukast (SINGULAIR) 10 MG tablet TAKE 1 TABLET BY MOUTH EVERY DAY 90 tablet 0  . Multiple Vitamins-Minerals (WOMENS MULTI VITAMIN & MINERAL PO) Take 1 tablet by mouth daily.    Marland Kitchen omeprazole (PRILOSEC) 20 MG capsule Take 1 capsule (20 mg total) by mouth daily. 90 capsule 4  . ondansetron (ZOFRAN) 4 MG tablet Take 0.5-1 tablets (2-4 mg total) by mouth every 8 (eight) hours as needed for nausea or vomiting. 20 tablet 0  . pravastatin (PRAVACHOL) 40 MG tablet TAKE 1 TABLET EVERY DAY 90 tablet 4  . Probiotic Product (PROBIOTIC DAILY PO) Take 1 capsule by mouth daily.     . Triamcinolone Acetonide (NASACORT ALLERGY 24HR NA) Place 2 sprays into the nose daily as needed (allergies).      No current facility-administered medications for this visit.    PHYSICAL EXAMINATION:  ECOG PERFORMANCE STATUS: 1 - Symptomatic but completely ambulatory  Vitals:   11/05/19 1313  BP: (!) 149/90  Pulse: 76  Resp: 18  Temp: 97.9 F (36.6 C)    Filed Weights   11/05/19 1313  Weight: 162 lb (73.5 kg)    Physical Exam  Constitutional: She is oriented to person, place, and time. No distress.  HENT:  Head: Normocephalic and atraumatic.  Mouth/Throat: No oropharyngeal exudate.  Eyes: Pupils are equal, round, and reactive to light. EOM are normal. No scleral icterus.  Cardiovascular: Normal rate and regular rhythm.  No murmur heard. Pulmonary/Chest: Effort normal. No respiratory distress. She has no rales. She exhibits no tenderness.  Abdominal: Soft. She exhibits no distension. There is no abdominal tenderness.  Musculoskeletal:        General: No edema. Normal range of motion.     Cervical back: Normal range of motion and neck supple.  Neurological: She is alert and oriented to person, place, and time. No cranial nerve deficit. She exhibits normal muscle tone. Coordination normal.  Skin: Skin is warm and dry. She is not diaphoretic. No erythema.  Psychiatric: Affect normal.      LABORATORY DATA: I have personally reviewed the data as listed: CBC Latest Ref Rng & Units 09/01/2019 06/02/2019 03/10/2019  WBC 4.0 - 10.5 K/uL 11.4(H) 10.1 10.7(H)  Hemoglobin 12.0 - 15.0 g/dL 13.2 12.4 12.6  Hematocrit 36.0 - 46.0 % 42.8 38.3 38.7  Platelets 150 - 400 K/uL 381 347 376   CMP Latest Ref Rng & Units 09/01/2019 06/02/2019 03/10/2019  Glucose 70 - 99 mg/dL 101(H) 88 89  BUN 8 - 23 mg/dL '17 15 12  ' Creatinine 0.44 - 1.00 mg/dL 0.66 0.62 0.74  Sodium 135 - 145 mmol/L 136 136 136  Potassium 3.5 - 5.1 mmol/L 4.1 3.6 4.2  Chloride 98 - 111 mmol/L 101 102 101  CO2 22 - 32 mmol/L '25 25 28  ' Calcium 8.9 - 10.3 mg/dL 9.1 8.8(L) 8.6(L)  Total Protein 6.5 - 8.1 g/dL 7.7 7.6 7.6  Total Bilirubin 0.3 - 1.2 mg/dL 0.4 0.4 0.4  Alkaline Phos 38 - 126 U/L 73 66 80  AST 15 - 41 U/L 12(L) 14(L) 13(L)  ALT 0 - 44 U/L '10 10 10   ' RADIOGRAPHIC STUDIES: I have personally reviewed the radiological images as listed and agreed with the findings in the  report. DG Ribs Unilateral Left  Result Date: 10/06/2019 CLINICAL DATA:  Left rib pain, marked with BB EXAM: LEFT RIBS - 2 VIEW COMPARISON:  Radiograph 09/01/2018, CT 05/20/2018 FINDINGS: Radiopaque BB marker  projects over the lateral aspect of the tenth rib. There is a minimally displaced posterolateral left seventh rib fracture. No other evident rib fractures. No pneumothorax or effusion. Chronic bronchitic and coarsened interstitial changes in the lungs are similar to prior. Portions of the right lung are collimated. The aorta is calcified. The remaining cardiomediastinal contours are unremarkable. IMPRESSION: 1. Minimally displaced posterolateral left seventh rib fracture. 2. No pneumothorax or effusion. 3. Chronic bronchitic and coarsened interstitial changes in the lungs are similar to prior. 4.  Aortic Atherosclerosis (ICD10-I70.0). Electronically Signed   By: Lovena Le M.D.   On: 10/06/2019 05:08   NM PET Image Restag (PS) Skull Base To Thigh  Result Date: 09/21/2019 CLINICAL DATA:  Subsequent treatment strategy for colon carcinoma. Rising CEA level. EXAM: NUCLEAR MEDICINE PET SKULL BASE TO THIGH TECHNIQUE: 8.7 mCi F-18 FDG was injected intravenously. Full-ring PET imaging was performed from the skull base to thigh after the radiotracer. CT data was obtained and used for attenuation correction and anatomic localization. Fasting blood glucose: 111 mg/dl COMPARISON:  Most recent chest CT on 03/03/2019 and AP CT on 06/01/2019 FINDINGS: Mediastinal blood-pool activity (background): SUV max = 2.3 Liver activity (reference): SUV max = N/A NECK: 1.1 cm hypermetabolic mass is seen in the right parotid gland. This has SUV max of 11.4, and is suspicious for primary parotid neoplasm. No hypermetabolic lymph nodes identified. Incidental CT findings:  None. CHEST: No hypermetabolic masses or lymphadenopathy. No suspicious pulmonary nodules seen on CT images. Incidental CT findings: Stable mild post radiation  changes in left upper lobe. ABDOMEN/PELVIS: No abnormal hypermetabolic activity within the liver, pancreas, adrenal glands, or spleen. Bilateral lower attenuation adrenal masses are stable and show absence of hypermetabolic activity, consistent with benign adrenal adenomas. Postop changes are again seen from right hemicolectomy. A 1.0 cm irregular nodular density is again seen in the mesenteric fat adjacent to the enterocolonic anastomosis on image 192/3. This is not significant changed in size compared to prior studies, but does show hypermetabolic activity, with SUV max of 3.3. Another soft tissue nodule measuring 1.5 cm with a central focus of calcification is seen in the anterior right lower quadrant mesentery on image 209/3. This is also stable in size, but is hypermetabolic, with SUV max of 10.9. Incidental CT findings:  None. SKELETON: No focal hypermetabolic bone lesions to suggest skeletal metastasis. Incidental CT findings:  None. IMPRESSION: Two small right abdominal mesenteric soft tissue nodules show no significant change in size compared to recent CTs, but are hypermetabolic. Peritoneal metastatic disease cannot be excluded. No other definite sites of metastatic disease identified. 1.1 cm hypermetabolic nodule in the right parotid gland, suspicious for primary parotid neoplasm. Consider ENT consultation. Electronically Signed   By: Marlaine Hind M.D.   On: 09/21/2019 16:41   [place] CORE BIOPSY (SOFT TISSUE)  Result Date: 10/28/2019 CLINICAL DATA:  History of colon carcinoma and status post transverse colectomy and prior treatment with chemotherapy and radiation therapy. Recent imaging has demonstrated new omental, peritoneal and abdominal wall soft tissue nodules which are hypermetabolic on PET scan and suspicious for recurrent disease. EXAM: ULTRASOUND GUIDED CORE BIOPSY OF ABDOMINAL WALL MASS MEDICATIONS: 1.0 mg IV Versed; 50 mcg IV Fentanyl Total Moderate Sedation Time: 12 minutes. The patient's  level of consciousness and physiologic status were continuously monitored during the procedure by Radiology nursing. PROCEDURE: The procedure, risks, benefits, and alternatives were explained to the patient. Questions regarding the procedure were encouraged and answered. The patient understands and consents to  the procedure. A time out was performed prior to initiating the procedure. Ultrasound was performed of the abdominal wall and anterior peritoneal cavity. The anterior abdominal wall was prepped with chlorhexidine in a sterile fashion, and a sterile drape was applied covering the operative field. A sterile gown and sterile gloves were used for the procedure. Local anesthesia was provided with 1% Lidocaine. Core biopsy of an anterior abdominal wall mass was performed with an 18 gauge core biopsy device. Four separate core biopsy samples were obtained and submitted in formalin. COMPLICATIONS: None. FINDINGS: Irregular lobulated hypoechoic soft tissue mass is seen involving the right lower abdominal wall and corresponding to one of the lesions that demonstrated increased metabolic activity by PET scan. This area measures approximately 3.2 x 2.1 x 2.6 cm by ultrasound. Solid tissue was obtained with biopsy. IMPRESSION: Ultrasound-guided core biopsy performed of a right lower abdominal wall mass measuring 3 cm in estimated maximal diameter. Electronically Signed   By: Aletta Edouard M.D.   On: 10/28/2019 12:02      ASSESSMENT/PLAN 74yo female who has MSI  stable stage IIIB Colon Cancer presents for follow up  Cancer Staging Malignant neoplasm of transverse colon [facility]) Staging form: Colon and Rectum, AJCC 8th Edition - Clinical stage from 04/15/2017: Stage IIIB (cT4a, cN1b, cM0) - Signed by Earlie Server, MD on 04/26/2017  1. Malignant neoplasm of transverse colon ([place])   2. Goals of care, counseling/discussion   3. Personal history of malignant neoplasm of breast   4. Parotid nodule   5. Monocytosis     #History of stage IIIB colon cancer -03/2017 PET scan images were independently reviewed by me and discussed with patient. Pathology was reviewed and discussed with patient.  Consistent with recurrence/metastasis. Patient's case was presented on tumor board. Recommend systemic chemotherapy treatments. I discus with patient that most likely she will need systemic treatment. If recurrence is not resectable or she has peritoneal carcinomatosis, I will recommend FOLFIRI plus Avastin. I explained to the patient the risks and benefits of chemotherapy FOLFIRI +Avastin including all but not limited to infusion reaction, hair loss, hearing loss, mouth sore, nausea, vomiting, diarrhea low blood counts, bleeding, heart failure, kidney failure and risk of life threatening infection and even death, secondary malignancy, thrombosis, perforation, etc.    #Also would like to refer patient to surgery Dr. Dahlia Byes for further evaluation to see if there is recurrence is potentially resectable, and also if there is any peritoneal metastasis.  I discussed patient's case with Dr. Dahlia Byes and he will see patient for discussion of laparoscopic evaluation for evaluation of peritoneal metastasis.  If potentially resectable, she will need to have adjuvant chemotherapy  # History of breast cancer, need mammogram in March 2021 # Parotid nodule with hypermetabolism. Will refer to ENT in the future. Her evaluation and treatment of colon caner takes priority.   # Leukocytosis, intermittent monocytosis, she declined bone marrow biopsy on 05/22/2018. Monitor.  Orders Placed This Encounter  Procedures  . CBC with Differential    Standing Status:   Standing    Number of Occurrences:   20    Standing Expiration Date:   11/04/2020  . Comprehensive metabolic panel    Standing Status:   Standing    Number of Occurrences:   20    Standing Expiration Date:   11/04/2020  . Urinalysis, dipstick only    Standing Status:   Standing     Number of Occurrences:   20    Standing Expiration Date:  11/04/2020  . CEA    Standing Status:   Standing    Number of Occurrences:   20    Standing Expiration Date:   11/04/2020   follow up to be determined   Earlie Server, MD, PhD Hematology Oncology [facility] Pager- [phone] 11/05/19

## 2019-11-05 NOTE — Progress Notes (Signed)
Patient does not offer any problems today.  

## 2019-11-05 NOTE — Telephone Encounter (Signed)
-----   Message from Birdie Sons, MD sent at 11/04/2019  1:53 PM EST ----- Vitamin d levels are very low. If she is taking the 5,000 units weekly vitamin d she should stop and change to OTC vitamin D3 2,000 units once a day. She does need to continue taking alendronate (Fosamax) 70mg  once every week.  Cholesterol is very good. Schedule follow up in 6 months.

## 2019-11-05 NOTE — Telephone Encounter (Signed)
Patient advised of results. Patient was confused and thought that the Fosamax was a Vitamin D supplement. Patient told me that the Alendronate 70mg  tablet once a week was all she was taking. She has not been taking any Vitamin D. I advised patient that she should be taking Vitamin D3 2,000 units daily in addition to the Alendronate 70mg  once a week. Patient verbalized understanding and plans to go to the store to pick up Vitamin D3 supplement.

## 2019-11-05 NOTE — Addendum Note (Signed)
Addended by: Birdie Sons on: 11/05/2019 12:11 PM   Modules accepted: Orders

## 2019-11-05 NOTE — Progress Notes (Signed)
DISCONTINUE ON PATHWAY REGIMEN - Colorectal     A cycle is every 14 days:     Oxaliplatin      Leucovorin      5-Fluorouracil      5-Fluorouracil   **Always confirm dose/schedule in your pharmacy ordering system**  REASON: Disease Progression PRIOR TREATMENT: COS67: [id number] q14 Days x 6 Months TREATMENT RESPONSE: Progressive Disease (PD)  START ON PATHWAY REGIMEN - Colorectal     A cycle is every 14 days:     Bevacizumab-xxxx      Irinotecan      Leucovorin      Fluorouracil      Fluorouracil   **Always confirm dose/schedule in your pharmacy ordering system**  Patient Characteristics: Distant Metastases, Nonsurgical Candidate, KRAS/NRAS Mutation Positive/Unknown (BRAF V600 Wild-Type/Unknown), Standard Cytotoxic Therapy, First Line Standard Cytotoxic Therapy, Bevacizumab Eligible, PS = 0,1 Tumor Location: Colon Therapeutic Status: Distant Metastases Microsatellite/Mismatch Repair Status: MSS/pMMR BRAF Mutation Status: Awaiting Test Results KRAS/NRAS Mutation Status: Awaiting Test Results Standard Cytotoxic Line of Therapy: First Line Standard Cytotoxic Therapy ECOG Performance Status: 1 Bevacizumab Eligibility: Eligible Intent of Therapy: Non-Curative / Palliative Intent, Discussed with Patient

## 2019-11-06 ENCOUNTER — Other Ambulatory Visit: Payer: Self-pay | Admitting: Oncology

## 2019-11-06 ENCOUNTER — Other Ambulatory Visit: Payer: Self-pay | Admitting: Internal Medicine

## 2019-11-06 ENCOUNTER — Other Ambulatory Visit: Payer: Self-pay | Admitting: Family Medicine

## 2019-11-06 NOTE — Telephone Encounter (Signed)
Requested medication (s) are due for refill today  Yes  Requested medication (s) are on the active medication list Yes  Future visit scheduled  Yes  Non delegated medication.  Last prescribed 09/27/19  LOV  11/03/19     Requested Prescriptions  Pending Prescriptions Disp Refills   ondansetron (ZOFRAN) 4 MG tablet [Pharmacy Med Name: ONDANSETRON HYDROCHLORIDE 4 MG Tablet] 20 tablet 0    Sig: TAKE 1/2 TO 1 TABLET EVERY 8 HOURS AS NEEDED FOR NAUSEA OR VOMITING      Not Delegated - Gastroenterology: Antiemetics Failed - 11/06/2019  7:58 PM      Failed - This refill cannot be delegated      Passed - Valid encounter within last 6 months    Recent Outpatient Visits           3 days ago Atherosclerosis of coronary artery of native heart without angina pectoris, unspecified vessel or lesion type   [facility] Birdie Sons, MD   1 month ago Herpes zoster without complication   [facility] Birdie Sons, MD   7 months ago Annual physical exam   [facility] Birdie Sons, MD   1 year ago Superficial thrombophlebitis of right upper extremity   [place], [place]   1 year ago Cellulitis of abdominal wall   [facility] Birdie Sons, MD       Future Appointments             In 2 months  [facility], [place]   In 6 months Fisher, Kirstie Peri, MD [facility], [place]

## 2019-11-11 ENCOUNTER — Telehealth: Payer: Self-pay

## 2019-11-11 NOTE — Telephone Encounter (Signed)
FMLA form has been filled out and emailed to patient's daughter, Neoma Laming, at her request (confirmed emai with Neoma Laming)   Called patient to inform email sent.

## 2019-11-12 ENCOUNTER — Telehealth: Payer: Self-pay | Admitting: Oncology

## 2019-11-12 NOTE — Telephone Encounter (Signed)
Patient's daughter Neoma Laming called and want to discuss about her mother's situation. Called her back at [phone] times a day and I was not able to reach her.

## 2019-11-12 NOTE — Telephone Encounter (Signed)
Discussed with Neoma Laming over the phone. Plan is for patient to see Dr. Dahlia Byes for evaluation of whether patient's recurrence is resectable or not, and whether there is any peritoneal metastasis.  Dr.Pabon plans to discuss with her about laparoscopic evaluation for peritoneal metastasis. Patient's daughter appreciate explanation.

## 2019-11-14 DIAGNOSIS — J441 Chronic obstructive pulmonary disease with (acute) exacerbation: Secondary | ICD-10-CM | POA: Diagnosis not present

## 2019-11-14 DIAGNOSIS — S72046A Nondisplaced fracture of base of neck of unspecified femur, initial encounter for closed fracture: Secondary | ICD-10-CM | POA: Diagnosis not present

## 2019-11-14 DIAGNOSIS — R062 Wheezing: Secondary | ICD-10-CM | POA: Diagnosis not present

## 2019-11-15 ENCOUNTER — Other Ambulatory Visit: Payer: Self-pay

## 2019-11-15 DIAGNOSIS — C184 Malignant neoplasm of transverse colon: Secondary | ICD-10-CM

## 2019-11-15 NOTE — Telephone Encounter (Signed)
Referral entered for Dr. Dahlia Byes

## 2019-11-17 ENCOUNTER — Encounter: Payer: Self-pay | Admitting: Surgery

## 2019-11-17 ENCOUNTER — Other Ambulatory Visit: Payer: Self-pay

## 2019-11-17 ENCOUNTER — Ambulatory Visit (INDEPENDENT_AMBULATORY_CARE_PROVIDER_SITE_OTHER): Payer: Medicare HMO | Admitting: Surgery

## 2019-11-17 VITALS — BP 142/83 | HR 105 | Temp 96.8°F | Resp 12 | Ht 64.0 in | Wt 161.8 lb

## 2019-11-17 DIAGNOSIS — C184 Malignant neoplasm of transverse colon: Secondary | ICD-10-CM

## 2019-11-17 NOTE — Patient Instructions (Addendum)
Dr.Pabon discussed the different treatment options with patient at today's visit such as a surgical perspective of treatment and then he also discussed the start of chemotherapy. He discussed with patient that he could try making a small incision with a scope to see what the next plan of treatment would be. Patient plans to have Port Placement within the next couple of weeks.  Colorectal Cancer  Colorectal cancer is an abnormal growth of cells and tissue (tumor) in the colon or rectum, which are parts of the large intestine. The cancer can spread (metastasize) to other parts of the body. What are the causes? Most cases of colorectal cancer start as abnormal growths called polyps on the inner wall of the colon or rectum. Other times, abnormal changes to genes (genetic mutations) can cause cells to form cancer. What increases the risk? You are more likely to develop this condition if:  You are older than age 68.  You have multiple polyps in the colon or rectum.  You have diabetes.  You are African American.  You have a family history of Lynch syndrome.  You have had cancer before.  You have certain hereditary conditions, such as: ? Familial adenomatous polyposis. ? Turcot syndrome. ? Peutz-Jeghers syndrome.  You eat a diet that is high in fat (especially animal fat) and low in fiber, fruits, and vegetables.  You have an inactive (sedentary) lifestyle.  You have an inflammatory bowel disease or Crohn's disease.  You smoke.  You drink alcohol excessively. What are the signs or symptoms? Early colorectal cancer often does not cause symptoms. As the cancer grows, symptoms may include:  Changes in bowel habits.  Feeling like the bowel does not empty completely after a bowel movement.  Stools that are narrower than usual.  Blood in the stool.  Diarrhea.  Constipation.  Anemia.  Discomfort, pain, bloating, fullness, or cramps in the abdomen.  Frequent gas  pain.  Unexplained weight loss.  Constant fatigue.  Nausea and vomiting. How is this diagnosed? This condition may be diagnosed with:  A medical history.  A physical exam.  Tests. These may include: ? A digital rectal exam. ? A stool test called a fecal occult blood test. ? Blood tests. ? A test in which a tissue sample is taken from the colon or rectum and examined under a microscope (biopsy). ? Imaging tests, such as:  X-rays.  A barium enema.  CT scans.  MRIs.  A sigmoidoscopy. This test is done to view the inside of the rectum.  A colonoscopy. This test is done to view the inside of the colon. During this test, small polyps can be removed or biopsies may be taken.  An endorectal ultrasound. This test checks how deep a tumor in the rectum has grown and whether the cancer has spread to lymph nodes or other nearby tissues. Your health care provider may order additional tests to find out whether the cancer has spread to other parts of the body (what stage it is). The stages of cancer include:  Stage 0. At this stage, the cancer is found only in the innermost lining of the colon or rectum.  Stage I. At this stage, the cancer has grown into the inner wall of the colon or rectum.  Stage II. At this stage, the cancer has gone more deeply into the wall of the colon or rectum or through the wall. It may have invaded nearby tissue.  Stage III. At this stage, the cancer has spread to nearby  lymph nodes.  Stage IV. At this stage, the cancer has spread to other parts of the body, such as the liver or lungs. How is this treated? Treatment for this condition depends on the type and stage of the cancer. Treatment may include:  Surgery. In the early stages of the cancer, surgery may be done to remove polyps or small tumors from the colon. In later stages, surgery may be done to remove part of the colon.  Chemotherapy. This treatment uses medicines to kill cancer cells.  Targeted  therapy. This treatment targets specific gene mutations or proteins that the cancer expresses in order to kill tumor cells.  Radiation therapy. This treatment uses radiation to kill cancer cells or shrink tumors.  Radiofrequency ablation. This treatment uses radio waves to destroy the tumors that may have spread to other areas of the body, such as the liver. Follow these instructions at home:  Take over-the-counter and prescription medicines only as told by your health care provider.  Try to eat regular, healthy meals. Some of your treatments might affect your appetite. If you are having problems eating or with your appetite, ask to meet with a food and nutrition specialist (dietitian).  Consider joining a support group. This may help you learn about your diagnosis and cope with the stress of having colorectal cancer.  If you are admitted to the hospital, inform your cancer care team.  Keep all follow-up visits as told by your health care provider. This is important. How is this prevented?  Colorectal cancer can be prevented with screening tests that find polyps so they can be removed before they develop into cancer.  All adults should have screening for colorectal cancer starting at age 4 and continuing until age 11. Your health care provider may recommend screening at age 18. People at increased risk should start screening at an earlier age. Where to find more information  American Cancer Society: https://www.cancer.[place] https://www.cancer.gov Contact a health care provider if:  Your diarrhea or constipation does not go away.  You have blood in your stool.  Your bowel habits change.  You have increased pain in your abdomen.  You notice new fatigue or weakness.  You lose weight. Get help right away if:  You have increased bleeding from your rectum.  You have any uncontrollable or severe abdomen (abdominal) symptoms. Summary  Colorectal  cancer is an abnormal growth of cells and tissue (tumor) in the colon or rectum.  Common risk factors for this condition include having a relative with colon cancer, being older in age, having an inflammatory bowel disease, and being African American.  This condition may be diagnosed with tests, such as a colonoscopy and biopsy.  Treatment depends on the type and stage of the cancer. Commonly, treatment includes surgery to remove the tumor along with chemotherapy or targeted therapy.  Keep all follow-up visits as told by your health care provider. This is important. This information is not intended to replace advice given to you by your health care provider. Make sure you discuss any questions you have with your health care provider. Document Revised: 11/06/2017 Document Reviewed: 10/18/2016 Elsevier Patient Education  [place].

## 2019-11-22 ENCOUNTER — Encounter: Payer: Self-pay | Admitting: Surgery

## 2019-11-22 NOTE — H&P (View-Only) (Signed)
Outpatient Surgical Follow Up  11/22/2019  Kimberly [name] is an 74 y.o. female.   Chief Complaint  Patient presents with  . Follow-up    established patient Malignant neoplasm of transverse colon [facility]) referred by Dr. Tasia Catchings    HPI: This point is a 74 year old female well-known to me with a prior history of obstructing colon cancer initially had a transverse colectomy with end colostomy and mucous fistula performed by Dr. Vilinda Boehringer for cancer 2018.  Patient developed parastomal hernia with colostomy prolapse and intussusception of the terminal ileum into the cecum, requiring parastomal hernia repair completion right hemicolectomy with end ileostomy and abdominal wall resection of mass that turned out not to be cancer.  Performed by me on March 2019. SHe did have another parastomal hernia requiring a hernia repair with ileostomy takedown on September 2019 by me.  Did have a complicated postoperative course consisting of abdominal wall abscesses requiring incisional drainage and prolonged antibiotic.  Most recently underwent a colonoscopy in November 2020 without evidence of any intraluminal lesions.  Please note that I have personally reviewed the images.  Was recently she did have imaging of the abdomen and pelvis in the form of a CT and a PET CT that have personally reviewed this was performed without contrast due to the iodine allergy.  He had a positive PET on the abdominal wall that prompted a core biopsy and it was consistent with metastatic adenocarcinoma.  She is seen once again at the request of Dr. Tasia Catchings for surgical opinion.     Past Medical History:  Diagnosis Date  . Breast cancer, left ([place]) 2004   Lumpectomy and rad tx's.  . Chronic back pain   . Colon cancer ([place]) 2018  . COPD (chronic obstructive pulmonary disease) ([place])   . Degenerative disc disease, lumbar 04/2013  . Family history of adverse reaction to anesthesia    son arrested after anesthesia  . GERD (gastroesophageal reflux  disease)   . Iron deficiency anemia 05/27/2017  . Personal history of chemotherapy 2018   Colon  . Personal history of radiation therapy    Breast 2004  . Personal history of radiation therapy 2018   Colon  . Postprocedural intraabdominal abscess 09/01/2018  . Small bowel obstruction ([place]) 04/16/2017  . Wears dentures    full upper    Past Surgical History:  Procedure Laterality Date  . APPENDECTOMY  1965  . BREAST EXCISIONAL BIOPSY Left 08/01/2003   lumpectomy rad 11/04-2/28/2005  . BREAST SURGERY    . CESAREAN SECTION     x3  . COLON SURGERY    . COLONOSCOPY W/ POLYPECTOMY    . COLONOSCOPY WITH PROPOFOL N/A 04/09/2018   Procedure: COLONOSCOPY WITH PROPOFOL;  Surgeon: Virgel Manifold, MD;  Location: [facility] END[facility]COPY;  Service: Gastroenterology;  Laterality: N/A;  . COLONOSCOPY WITH PROPOFOL N/A 08/13/2019   Procedure: COLONOSCOPY WITH PROPOFOL;  Surgeon: Virgel Manifold, MD;  Location: [place];  Service: Endoscopy;  Laterality: N/A;  . COLOSTOMY TAKEDOWN N/A 06/25/2018   Procedure: COLOSTOMY TAKEDOWN;  Surgeon: Jules Husbands, MD;  Location: [facility] ORS;  Service: General;  Laterality: N/A;  . ESOPHAGOGASTRODUODENOSCOPY (EGD) WITH PROPOFOL N/A 04/04/2017   Procedure: ESOPHAGOGASTRODUODENOSCOPY (EGD) WITH PROPOFOL;  Surgeon: [name], MD;  Location: [place];  Service: Endoscopy;  Laterality: N/A;  . FRACTURE SURGERY    . HIP FRACTURE SURGERY Left 01/25/2012  . JOINT REPLACEMENT    . LAPAROTOMY N/A 04/15/2017   Procedure: EXPLORATORY LAPAROTOMY;  Surgeon:  Clayburn Pert, MD;  Location: [facility] OR[facility];  Service: General;  Laterality: N/A;  . NECK SURGERY  12/2011  . PARASTOMAL HERNIA REPAIR N/A 12/03/2017   Procedure: HERNIA REPAIR PARASTOMAL;  Surgeon: Jules Husbands, MD;  Location: [facility] OR[facility];  Service: General;  Laterality: N/A;  . PARASTOMAL HERNIA REPAIR N/A 06/25/2018   Procedure: HERNIA REPAIR PARASTOMAL;  Surgeon: Jules Husbands, MD;  Location: [facility] OR[facility];   Service: General;  Laterality: N/A;  . PORTACATH PLACEMENT Right 05/21/2017   Procedure: INSERTION PORT-A-CATH;  Surgeon: Clayburn Pert, MD;  Location: [facility] OR[facility];  Service: General;  Laterality: Right;  . WRIST FRACTURE SURGERY      Family History  Problem Relation Age of Onset  . Diabetes Mother        type 2  . Coronary artery disease Mother   . Deep vein thrombosis Mother   . Colon cancer Father   . Asthma Brother   . Diabetes Brother        type 2  . Breast cancer Neg Hx     Social History:  reports that she quit smoking about 2 years ago. Her smoking use included cigarettes. She has a 13.75 pack-year smoking history. She has never used smokeless tobacco. She reports current alcohol use. She reports that she does not use drugs.  Allergies:  Allergies  Allergen Reactions  . Betadine [Povidone Iodine] Other (See Comments)    Topical iodine she states "if you put it in an open sore it will cause a eating ulcer."  . Iodine Other (See Comments)    Patient states "it gives me infections"  . Other Nausea And Vomiting    Antihystamines  . Sinus Formula [Cholestatin] Nausea Only    Medications reviewed.    ROS Full ROS performed and is otherwise negative other than what is stated in HPI   BP (!) 142/83   Pulse (!) 105   Temp (!) 96.8 F (36 C) (Temporal)   Resp 12   Ht 5\' 4"  (1.626 m)   Wt 161 lb 12.8 oz (73.4 kg)   SpO2 91%   BMI 27.77 kg/m   Physical Exam Vitals and nursing note reviewed. Exam conducted with a chaperone present.  Constitutional:      General: She is not in acute distress.    Appearance: Normal appearance. She is normal weight.  Cardiovascular:     Rate and Rhythm: Normal rate and regular rhythm.     Heart sounds: No murmur.  Pulmonary:     Effort: Pulmonary effort is normal. No respiratory distress.     Breath sounds: No stridor. No wheezing.  Abdominal:     General: Abdomen is flat. There is no distension.     Palpations: There is mass.      Tenderness: There is no abdominal tenderness. There is no guarding.     Hernia: A hernia is present.     Comments: There is evidence of an abdominal wall mass that is fixed to the muscle measuring approximately 3 cm and is to the right of the umbilicus.  There is also an incisional hernia to the left of the midline.  This is reducible. prior laparotomy scars.  Musculoskeletal:        General: No swelling. Normal range of motion.     Cervical back: Normal range of motion and neck supple. No rigidity or tenderness.  Lymphadenopathy:     Cervical: No cervical adenopathy.  Skin:    General: Skin is warm and  dry.     Capillary Refill: Capillary refill takes less than 2 seconds.  Neurological:     General: No focal deficit present.     Mental Status: She is alert and oriented to person, place, and time.  Psychiatric:        Mood and Affect: Mood normal.        Behavior: Behavior normal.        Thought Content: Thought content normal.        Judgment: Judgment normal.         Assessment/Plan: 74 year old female with history of transfers adenocarcinoma status post 2 bowel resection including a transverse colectomy and a formal right colectomy with rocky postoperative course complicated by abdominal wall infection and ventral hernias.  Now she comes because of a local recurrence within the abdominal wall as well as the nodules within the mesentery.  I had an extensive discussion with the patient.  The mass in the abdominal wall is palpable but she does definitely have a hostile abdomen in addition to a small ventral hernia.  I had an extensive discussion with patient regarding the role for surgical intervention.  In order to obtain an R0 resection patient will have to undergo a major abdominal wall excision and resection with reconstruction.  I advised that before and rolling to this major route the first step will be to visualize whether or not she will had overt peritoneal spread.  If she  is got peritoneal breast spread there is a role for radical resection of the abdominal wall.  Discussed with the patient in detail and she is agreeable to diagnostic laparoscopy to assess for any other distant metastatic disease as well as port placement.  Procedure discussed with the patient in detail.  Risk, benefits and possible complications including but not limited to: Bleeding, infection bowel injuries vascular injuries and pneumothorax.  Extensive counseling provided.  Please note that I spent more than 50 minutes in this encounter with greater than 50% spent in coordination counseling of her care.      [name] Hamman, MD [facility] General Surgeon

## 2019-11-22 NOTE — Progress Notes (Signed)
Outpatient Surgical Follow Up  11/22/2019  Kimberly [name] is an 74 y.o. female.   Chief Complaint  Patient presents with  . Follow-up    established patient Malignant neoplasm of transverse colon [facility]) referred by Dr. Tasia Catchings    HPI: This point is a 74 year old female well-known to me with a prior history of obstructing colon cancer initially had a transverse colectomy with end colostomy and mucous fistula performed by Dr. Vilinda Boehringer for cancer 2018.  Patient developed parastomal hernia with colostomy prolapse and intussusception of the terminal ileum into the cecum, requiring parastomal hernia repair completion right hemicolectomy with end ileostomy and abdominal wall resection of mass that turned out not to be cancer.  Performed by me on March 2019. SHe did have another parastomal hernia requiring a hernia repair with ileostomy takedown on September 2019 by me.  Did have a complicated postoperative course consisting of abdominal wall abscesses requiring incisional drainage and prolonged antibiotic.  Most recently underwent a colonoscopy in November 2020 without evidence of any intraluminal lesions.  Please note that I have personally reviewed the images.  Was recently she did have imaging of the abdomen and pelvis in the form of a CT and a PET CT that have personally reviewed this was performed without contrast due to the iodine allergy.  He had a positive PET on the abdominal wall that prompted a core biopsy and it was consistent with metastatic adenocarcinoma.  She is seen once again at the request of Dr. Tasia Catchings for surgical opinion.     Past Medical History:  Diagnosis Date  . Breast cancer, left ([place]) 2004   Lumpectomy and rad tx's.  . Chronic back pain   . Colon cancer ([place]) 2018  . COPD (chronic obstructive pulmonary disease) ([place])   . Degenerative disc disease, lumbar 04/2013  . Family history of adverse reaction to anesthesia    son arrested after anesthesia  . GERD (gastroesophageal reflux  disease)   . Iron deficiency anemia 05/27/2017  . Personal history of chemotherapy 2018   Colon  . Personal history of radiation therapy    Breast 2004  . Personal history of radiation therapy 2018   Colon  . Postprocedural intraabdominal abscess 09/01/2018  . Small bowel obstruction ([place]) 04/16/2017  . Wears dentures    full upper    Past Surgical History:  Procedure Laterality Date  . APPENDECTOMY  1965  . BREAST EXCISIONAL BIOPSY Left 08/01/2003   lumpectomy rad 11/04-2/28/2005  . BREAST SURGERY    . CESAREAN SECTION     x3  . COLON SURGERY    . COLONOSCOPY W/ POLYPECTOMY    . COLONOSCOPY WITH PROPOFOL N/A 04/09/2018   Procedure: COLONOSCOPY WITH PROPOFOL;  Surgeon: Virgel Manifold, MD;  Location: [facility];  Service: Gastroenterology;  Laterality: N/A;  . COLONOSCOPY WITH PROPOFOL N/A 08/13/2019   Procedure: COLONOSCOPY WITH PROPOFOL;  Surgeon: Virgel Manifold, MD;  Location: [facility];  Service: Endoscopy;  Laterality: N/A;  . COLOSTOMY TAKEDOWN N/A 06/25/2018   Procedure: COLOSTOMY TAKEDOWN;  Surgeon: Jules Husbands, MD;  Location: [facility];  Service: General;  Laterality: N/A;  . ESOPHAGOGASTRODUODENOSCOPY (EGD) WITH PROPOFOL N/A 04/04/2017   Procedure: ESOPHAGOGASTRODUODENOSCOPY (EGD) WITH PROPOFOL;  Surgeon: [name], MD;  Location: [facility] [place];  Service: Endoscopy;  Laterality: N/A;  . FRACTURE SURGERY    . HIP FRACTURE SURGERY Left 01/25/2012  . JOINT REPLACEMENT    . LAPAROTOMY N/A 04/15/2017   Procedure: EXPLORATORY LAPAROTOMY;  Surgeon:  Clayburn Pert, MD;  Location: [facility];  Service: General;  Laterality: N/A;  . NECK SURGERY  12/2011  . PARASTOMAL HERNIA REPAIR N/A 12/03/2017   Procedure: HERNIA REPAIR PARASTOMAL;  Surgeon: Jules Husbands, MD;  Location: [facility];  Service: General;  Laterality: N/A;  . PARASTOMAL HERNIA REPAIR N/A 06/25/2018   Procedure: HERNIA REPAIR PARASTOMAL;  Surgeon: Jules Husbands, MD;  Location: [facility];   Service: General;  Laterality: N/A;  . PORTACATH PLACEMENT Right 05/21/2017   Procedure: INSERTION PORT-A-CATH;  Surgeon: Clayburn Pert, MD;  Location: [facility];  Service: General;  Laterality: Right;  . WRIST FRACTURE SURGERY      Family History  Problem Relation Age of Onset  . Diabetes Mother        type 2  . Coronary artery disease Mother   . Deep vein thrombosis Mother   . Colon cancer Father   . Asthma Brother   . Diabetes Brother        type 2  . Breast cancer Neg Hx     Social History:  reports that she quit smoking about 2 years ago. Her smoking use included cigarettes. She has a 13.75 pack-year smoking history. She has never used smokeless tobacco. She reports current alcohol use. She reports that she does not use drugs.  Allergies:  Allergies  Allergen Reactions  . Betadine [Povidone Iodine] Other (See Comments)    Topical iodine she states "if you put it in an open sore it will cause a eating ulcer."  . Iodine Other (See Comments)    Patient states "it gives me infections"  . Other Nausea And Vomiting    Antihystamines  . Sinus Formula [Cholestatin] Nausea Only    Medications reviewed.    ROS Full ROS performed and is otherwise negative other than what is stated in HPI   BP (!) 142/83   Pulse (!) 105   Temp (!) 96.8 F (36 C) (Temporal)   Resp 12   Ht 5\' 4"  (1.626 m)   Wt 161 lb 12.8 oz (73.4 kg)   SpO2 91%   BMI 27.77 kg/m   Physical Exam Vitals and nursing note reviewed. Exam conducted with a chaperone present.  Constitutional:      General: She is not in acute distress.    Appearance: Normal appearance. She is normal weight.  Cardiovascular:     Rate and Rhythm: Normal rate and regular rhythm.     Heart sounds: No murmur.  Pulmonary:     Effort: Pulmonary effort is normal. No respiratory distress.     Breath sounds: No stridor. No wheezing.  Abdominal:     General: Abdomen is flat. There is no distension.     Palpations: There is mass.      Tenderness: There is no abdominal tenderness. There is no guarding.     Hernia: A hernia is present.     Comments: There is evidence of an abdominal wall mass that is fixed to the muscle measuring approximately 3 cm and is to the right of the umbilicus.  There is also an incisional hernia to the left of the midline.  This is reducible. prior laparotomy scars.  Musculoskeletal:        General: No swelling. Normal range of motion.     Cervical back: Normal range of motion and neck supple. No rigidity or tenderness.  Lymphadenopathy:     Cervical: No cervical adenopathy.  Skin:    General: Skin is warm and  dry.     Capillary Refill: Capillary refill takes less than 2 seconds.  Neurological:     General: No focal deficit present.     Mental Status: She is alert and oriented to person, place, and time.  Psychiatric:        Mood and Affect: Mood normal.        Behavior: Behavior normal.        Thought Content: Thought content normal.        Judgment: Judgment normal.         Assessment/Plan: 74 year old female with history of transfers adenocarcinoma status post 2 bowel resection including a transverse colectomy and a formal right colectomy with rocky postoperative course complicated by abdominal wall infection and ventral hernias.  Now she comes because of a local recurrence within the abdominal wall as well as the nodules within the mesentery.  I had an extensive discussion with the patient.  The mass in the abdominal wall is palpable but she does definitely have a hostile abdomen in addition to a small ventral hernia.  I had an extensive discussion with patient regarding the role for surgical intervention.  In order to obtain an R0 resection patient will have to undergo a major abdominal wall excision and resection with reconstruction.  I advised that before and rolling to this major route the first step will be to visualize whether or not she will had overt peritoneal spread.  If she  is got peritoneal breast spread there is a role for radical resection of the abdominal wall.  Discussed with the patient in detail and she is agreeable to diagnostic laparoscopy to assess for any other distant metastatic disease as well as port placement.  Procedure discussed with the patient in detail.  Risk, benefits and possible complications including but not limited to: Bleeding, infection bowel injuries vascular injuries and pneumothorax.  Extensive counseling provided.  Please note that I spent more than 50 minutes in this encounter with greater than 50% spent in coordination counseling of her care.      [name] Hamman, MD [facility] General Surgeon

## 2019-11-23 ENCOUNTER — Telehealth: Payer: Self-pay | Admitting: Surgery

## 2019-11-23 NOTE — Telephone Encounter (Signed)
Pt has been advised of pre admission date/time, Covid Testing date and Surgery date.  Surgery Date: 12/07/19 Preadmission Testing Date: 11/25/19 (1p-5p) Covid Testing Date: 12/03/19 - patient advised to go to the [facility] ([place])  Patient has been made aware to call [phone], between 1-3:00pm the day before surgery, to find out what time to arrive.

## 2019-11-25 ENCOUNTER — Other Ambulatory Visit: Payer: Self-pay

## 2019-11-25 ENCOUNTER — Encounter
Admission: RE | Admit: 2019-11-25 | Discharge: 2019-11-25 | Disposition: A | Discharge status: Home / Self Care | Payer: Medicare HMO | Source: Ambulatory Visit | Attending: Surgery | Admitting: Surgery

## 2019-11-25 ENCOUNTER — Encounter: Payer: Self-pay | Admitting: Internal Medicine

## 2019-11-25 DIAGNOSIS — Z01818 Encounter for other preprocedural examination: Secondary | ICD-10-CM | POA: Insufficient documentation

## 2019-11-25 NOTE — Patient Instructions (Signed)
Your procedure is scheduled on: Tuesday 12/07/19.  Report to DAY SURGERY DEPARTMENT LOCATED ON 2ND FLOOR MEDICAL MALL ENTRANCE. To find out your arrival time please call [phone] between 1PM - 3PM on Monday 12/06/19.   Remember: Instructions that are not followed completely may result in serious medical risk, up to and including death, or upon the discretion of your surgeon and anesthesiologist your surgery may need to be rescheduled.      _X__ 1. Do not eat food after midnight the night before your procedure.                 No gum chewing or hard candies. You may drink clear liquids up to 2 hours                 before you are scheduled to arrive for your surgery- DO NOT drink clear                 liquids within 2 hours of the start of your surgery.                 Clear Liquids include:  water, apple juice without pulp, clear carbohydrate                 drink such as Clearfast or Gatorade, Black Coffee or Tea (Do not add                 anything to coffee or tea).    __X__2.  On the morning of surgery brush your teeth with toothpaste and water, you may rinse your mouth with mouthwash if you wish.  Do not swallow any toothpaste or mouthwash.       _X__ 3.  No Alcohol for 24 hours before or after surgery.     _X__ 4.  Do Not Smoke or use e-cigarettes For 24 Hours Prior to Your Surgery.                 Do not use any chewable tobacco products for at least 6 hours prior to                 Surgery.    __X__5.  Notify your doctor if there is any change in your medical condition      (cold, fever, infections).       Do not wear jewelry, make-up, hairpins, clips or nail polish. Do not wear lotions, powders, or perfumes.  Do not shave 48 hours prior to surgery. Men may shave face and neck. Do not bring valuables to the hospital.      [facility] is not responsible for any belongings or valuables.   Contacts, dentures/partials or body piercings may not be worn into  surgery. Bring a case for your contacts, glasses or hearing aids, a denture cup will be supplied.    Patients discharged the day of surgery will not be allowed to drive home.    __X__ Take these medicines the morning of surgery with A SIP OF WATER:     1. budesonide-formoterol (SYMBICORT)  2. docusate sodium (COLACE  3. gabapentin (NEURONTIN)  4. montelukast (SINGULAIR)  5. omeprazole (PRILOSEC)  6. pravastatin (PRAVACHOL)  7. HYDROcodone-acetaminophen (NORCO) if needed  8. (NASACORT ALLERGY 24HR NA) if needed     __X__ Use CHG Soap as directed    __ X__ Use inhalers on the day of surgery. Also bring the inhaler with you to the hospital on the morning of surgery.  __X__ Stop Anti-inflammatories 7 days before surgery (Tuesday 11/30/19) such as Advil, Ibuprofen, Motrin, BC or Goodies Powder, Naprosyn, Naproxen, Aleve, Aspirin, Meloxicam. May take Tylenol or Hydrocodone if needed for pain or discomfort.     __X__ Don't start taking any new herbal supplements or vitamins prior to your procedure.

## 2019-11-26 NOTE — Telephone Encounter (Signed)
[employer] report scanned under  media.

## 2019-11-29 ENCOUNTER — Inpatient Hospital Stay (HOSPITAL_BASED_OUTPATIENT_CLINIC_OR_DEPARTMENT_OTHER): Payer: Medicare HMO | Admitting: Oncology

## 2019-11-29 ENCOUNTER — Inpatient Hospital Stay: Payer: Medicare HMO | Attending: Oncology

## 2019-11-29 ENCOUNTER — Encounter: Payer: Self-pay | Admitting: Internal Medicine

## 2019-11-29 ENCOUNTER — Encounter
Admission: RE | Admit: 2019-11-29 | Discharge: 2019-11-29 | Disposition: A | Discharge status: Home / Self Care | Payer: Medicare HMO | Source: Ambulatory Visit | Attending: Surgery | Admitting: Surgery

## 2019-11-29 ENCOUNTER — Encounter: Payer: Self-pay | Admitting: Oncology

## 2019-11-29 ENCOUNTER — Other Ambulatory Visit: Payer: Self-pay

## 2019-11-29 VITALS — BP 144/78 | HR 92 | Temp 97.1°F | Wt 162.6 lb

## 2019-11-29 DIAGNOSIS — Z7189 Other specified counseling: Secondary | ICD-10-CM

## 2019-11-29 DIAGNOSIS — Z9049 Acquired absence of other specified parts of digestive tract: Secondary | ICD-10-CM | POA: Insufficient documentation

## 2019-11-29 DIAGNOSIS — G893 Neoplasm related pain (acute) (chronic): Secondary | ICD-10-CM | POA: Insufficient documentation

## 2019-11-29 DIAGNOSIS — I700 Atherosclerosis of aorta: Secondary | ICD-10-CM | POA: Diagnosis not present

## 2019-11-29 DIAGNOSIS — Z5112 Encounter for antineoplastic immunotherapy: Secondary | ICD-10-CM | POA: Insufficient documentation

## 2019-11-29 DIAGNOSIS — R634 Abnormal weight loss: Secondary | ICD-10-CM | POA: Diagnosis not present

## 2019-11-29 DIAGNOSIS — R5383 Other fatigue: Secondary | ICD-10-CM | POA: Insufficient documentation

## 2019-11-29 DIAGNOSIS — Z86010 Personal history of colonic polyps: Secondary | ICD-10-CM | POA: Diagnosis not present

## 2019-11-29 DIAGNOSIS — D509 Iron deficiency anemia, unspecified: Secondary | ICD-10-CM | POA: Diagnosis not present

## 2019-11-29 DIAGNOSIS — G629 Polyneuropathy, unspecified: Secondary | ICD-10-CM | POA: Insufficient documentation

## 2019-11-29 DIAGNOSIS — I4891 Unspecified atrial fibrillation: Secondary | ICD-10-CM | POA: Insufficient documentation

## 2019-11-29 DIAGNOSIS — Z923 Personal history of irradiation: Secondary | ICD-10-CM | POA: Diagnosis not present

## 2019-11-29 DIAGNOSIS — C786 Secondary malignant neoplasm of retroperitoneum and peritoneum: Secondary | ICD-10-CM | POA: Diagnosis not present

## 2019-11-29 DIAGNOSIS — Z7951 Long term (current) use of inhaled steroids: Secondary | ICD-10-CM | POA: Insufficient documentation

## 2019-11-29 DIAGNOSIS — C184 Malignant neoplasm of transverse colon: Secondary | ICD-10-CM | POA: Insufficient documentation

## 2019-11-29 DIAGNOSIS — R590 Localized enlarged lymph nodes: Secondary | ICD-10-CM | POA: Insufficient documentation

## 2019-11-29 DIAGNOSIS — R110 Nausea: Secondary | ICD-10-CM | POA: Diagnosis not present

## 2019-11-29 DIAGNOSIS — Z79899 Other long term (current) drug therapy: Secondary | ICD-10-CM | POA: Diagnosis not present

## 2019-11-29 DIAGNOSIS — D500 Iron deficiency anemia secondary to blood loss (chronic): Secondary | ICD-10-CM

## 2019-11-29 DIAGNOSIS — Z836 Family history of other diseases of the respiratory system: Secondary | ICD-10-CM | POA: Insufficient documentation

## 2019-11-29 DIAGNOSIS — E279 Disorder of adrenal gland, unspecified: Secondary | ICD-10-CM | POA: Diagnosis not present

## 2019-11-29 DIAGNOSIS — M7989 Other specified soft tissue disorders: Secondary | ICD-10-CM | POA: Diagnosis not present

## 2019-11-29 DIAGNOSIS — Z853 Personal history of malignant neoplasm of breast: Secondary | ICD-10-CM | POA: Diagnosis not present

## 2019-11-29 DIAGNOSIS — Z933 Colostomy status: Secondary | ICD-10-CM | POA: Insufficient documentation

## 2019-11-29 DIAGNOSIS — Z7901 Long term (current) use of anticoagulants: Secondary | ICD-10-CM | POA: Diagnosis not present

## 2019-11-29 DIAGNOSIS — Z888 Allergy status to other drugs, medicaments and biological substances status: Secondary | ICD-10-CM | POA: Insufficient documentation

## 2019-11-29 DIAGNOSIS — Z800 Family history of malignant neoplasm of digestive organs: Secondary | ICD-10-CM | POA: Insufficient documentation

## 2019-11-29 DIAGNOSIS — Z87891 Personal history of nicotine dependence: Secondary | ICD-10-CM | POA: Diagnosis not present

## 2019-11-29 DIAGNOSIS — R9431 Abnormal electrocardiogram [ECG] [EKG]: Secondary | ICD-10-CM | POA: Insufficient documentation

## 2019-11-29 DIAGNOSIS — Z01810 Encounter for preprocedural cardiovascular examination: Secondary | ICD-10-CM | POA: Diagnosis not present

## 2019-11-29 DIAGNOSIS — K5903 Drug induced constipation: Secondary | ICD-10-CM | POA: Diagnosis not present

## 2019-11-29 DIAGNOSIS — D72829 Elevated white blood cell count, unspecified: Secondary | ICD-10-CM | POA: Insufficient documentation

## 2019-11-29 DIAGNOSIS — Z8249 Family history of ischemic heart disease and other diseases of the circulatory system: Secondary | ICD-10-CM | POA: Insufficient documentation

## 2019-11-29 DIAGNOSIS — Z833 Family history of diabetes mellitus: Secondary | ICD-10-CM | POA: Insufficient documentation

## 2019-11-29 LAB — CBC WITH DIFFERENTIAL/PLATELET
Abs Immature Granulocytes: 0.05 10*3/uL (ref 0.00–0.07)
Basophils Absolute: 0.1 10*3/uL (ref 0.0–0.1)
Basophils Relative: 0 %
Eosinophils Absolute: 0.2 10*3/uL (ref 0.0–0.5)
Eosinophils Relative: 1 %
HCT: 32.7 % — ABNORMAL LOW (ref 36.0–46.0)
Hemoglobin: 10.1 g/dL — ABNORMAL LOW (ref 12.0–15.0)
Immature Granulocytes: 0 %
Lymphocytes Relative: 14 %
Lymphs Abs: 1.6 10*3/uL (ref 0.7–4.0)
MCH: 27 pg (ref 26.0–34.0)
MCHC: 30.9 g/dL (ref 30.0–36.0)
MCV: 87.4 fL (ref 80.0–100.0)
Monocytes Absolute: 1.4 10*3/uL — ABNORMAL HIGH (ref 0.1–1.0)
Monocytes Relative: 12 %
Neutro Abs: 8.4 10*3/uL — ABNORMAL HIGH (ref 1.7–7.7)
Neutrophils Relative %: 73 %
Platelets: 459 10*3/uL — ABNORMAL HIGH (ref 150–400)
RBC: 3.74 MIL/uL — ABNORMAL LOW (ref 3.87–5.11)
RDW: 16.6 % — ABNORMAL HIGH (ref 11.5–15.5)
WBC: 11.7 10*3/uL — ABNORMAL HIGH (ref 4.0–10.5)
nRBC: 0 % (ref 0.0–0.2)

## 2019-11-29 LAB — IRON AND TIBC
Iron: 16 ug/dL — ABNORMAL LOW (ref 28–170)
Saturation Ratios: 5 % — ABNORMAL LOW (ref 10.4–31.8)
TIBC: 326 ug/dL (ref 250–450)
UIBC: 310 ug/dL

## 2019-11-29 LAB — URINALYSIS, DIPSTICK ONLY
Glucose, UA: NEGATIVE mg/dL
Hgb urine dipstick: NEGATIVE
Ketones, ur: NEGATIVE mg/dL
Leukocytes,Ua: NEGATIVE
Nitrite: NEGATIVE
Protein, ur: 30 mg/dL — AB
Specific Gravity, Urine: 1.029 (ref 1.005–1.030)
pH: 5 (ref 5.0–8.0)

## 2019-11-29 LAB — COMPREHENSIVE METABOLIC PANEL
ALT: 13 U/L (ref 0–44)
AST: 14 U/L — ABNORMAL LOW (ref 15–41)
Albumin: 2.9 g/dL — ABNORMAL LOW (ref 3.5–5.0)
Alkaline Phosphatase: 92 U/L (ref 38–126)
Anion gap: 11 (ref 5–15)
BUN: 19 mg/dL (ref 8–23)
CO2: 23 mmol/L (ref 22–32)
Calcium: 8.5 mg/dL — ABNORMAL LOW (ref 8.9–10.3)
Chloride: 101 mmol/L (ref 98–111)
Creatinine, Ser: 0.75 mg/dL (ref 0.44–1.00)
GFR calc Af Amer: >60 mL/min (ref >60)
GFR calc non Af Amer: >60 mL/min (ref >60)
Glucose, Bld: 140 mg/dL — ABNORMAL HIGH (ref 70–99)
Potassium: 3.8 mmol/L (ref 3.5–5.1)
Sodium: 135 mmol/L (ref 135–145)
Total Bilirubin: 0.3 mg/dL (ref 0.3–1.2)
Total Protein: 7.1 g/dL (ref 6.5–8.1)

## 2019-11-29 LAB — FERRITIN: Ferritin: 28 ng/mL (ref 11–307)

## 2019-11-29 NOTE — Pre-Procedure Instructions (Signed)
Pre-Admit Testing Provider Notification Note  Provider Notified: Dr. Dahlia Byes  Notification Mode: Fax  Reason: Abnormal labs.  Response: Fax confirmation received.   Additional Information: Placed on chart. Noted on Pre-Admit Worksheet.  Signed: Beulah Gandy, RN

## 2019-11-29 NOTE — Progress Notes (Signed)
Pt contacted for follow up. No concerns voiced.

## 2019-11-30 ENCOUNTER — Other Ambulatory Visit: Payer: Medicare HMO

## 2019-11-30 ENCOUNTER — Other Ambulatory Visit: Payer: Self-pay | Admitting: Family Medicine

## 2019-11-30 DIAGNOSIS — Z7189 Other specified counseling: Secondary | ICD-10-CM | POA: Insufficient documentation

## 2019-11-30 DIAGNOSIS — M5136 Other intervertebral disc degeneration, lumbar region: Secondary | ICD-10-CM

## 2019-11-30 LAB — CEA: CEA: 9.5 ng/mL — ABNORMAL HIGH (ref 0.0–4.7)

## 2019-11-30 MED ORDER — HYDROCODONE-ACETAMINOPHEN 7.5-325 MG PO TABS: 1.0000 | ORAL_TABLET | Freq: Four times a day (QID) | ORAL | 0 refills | Status: DC | PRN

## 2019-11-30 NOTE — Progress Notes (Addendum)
[facility] Follow up  Visit:  Patient Care Team: Birdie Sons, MD as PCP - General (Family Medicine) Earlie Server, MD as Consulting Physician (Oncology) Noreene Filbert, MD as Referring Physician (Radiation Oncology) Jules Husbands, MD as Consulting Physician (General Surgery) Benedetto Goad, RN as Registered Nurse Cathi Roan, [facility] (Pharmacist) Clent Jacks, RN as Oncology Nurse Navigator  PURPOSE OF VISIT:  colon cancer  HISTORY OF PRESENTING ILLNESS: Kimberly [name] 74 y.o. female with PMH listed as below presents for follow up for the management of Stage IIIB Colon Cancer Patient reports a remote history of breast cancer s/p lumpectomy and radiation treatments.   Patient had been having abdominal pain, weight loss and bloating.  CT scan showed partial obstruction at the level of transverse colon and ileocolic mesenteric adenopathy. She was prepared for a colonoscopy on 04/15/2017. She started to have worsened abdominal pain, nausea vomiting, unable to maintain oral intake and was sent to ED. She was found to have bowel obstruction and underwent  exploratory laparotomy with transverse colectomy, with end colostomy and mucous fistula formation for obstructing colon lesion. Differential diagnosis prior to surgery was metastatic breast cancer in colon vs colon cancer. The patient did have a colonoscopy 2 years ago without the mass being present at that time but did have 2 small polyps in the transverse colon that were removed.  04/15/2017 Patient underwent transverse colon resection.  Pathology showed T4aN1 moderate differentiated adenocarcinoma, Grade 2, tumor invades visceral peritoneum, LVI present, negative margin, 3/16 lymph nodes involved with cancer. Low probability of MSI-H.   # Genetic test INVITAE came back negative. No pathogenetic sequence variants or deletions/duplications identified. Results was scanned to [employer] (a copy of the report was provided to  patient and her daughter)  # # baseline CEA is 2.1 # colostomy reversal on 06/25/2018, subsequently developed C. difficile colitis x 2.  Multiple abdominal CT abdomen and pelvis for obtained, not listed here #09/01/2018 CT abdomen pelvis showed abdominal wall abscess,  percutaneous drain placement .treatment with antibiotics with Augmentin 10 days course.  #09/14/2018 CT abdomen/pelvis showed resolution of abscess, no intraabdominal abscess.  Mediport was discontinued. # 08/13/2019 colonoscopy showed localized area of mildly thickened folds of the mucosa was found in the sigmoid colon. Biopsy pathology showed benign mucosa with lymphoid aggregates and focal minimal inflammation. No dysplasia.   TREATMENT:  04/15/2017 transverse colon resection 05/27/2017- 11/17/2017 adjuvant FOLFOX, Oxaliplatin was discontinued after 3 cycles due to worsening of pre-existing neuropathy. Finished another 9 cycles of 5-FU.  March -April 2019 adjuvant RT.  06/25/2018 Colostomy Reversal.   09/21/2019 PET scan showed 2 small right abdominal mesenteric soft tissue nodule which showed no significant change in size compared to recent CT but hypermetabolic.  Peritoneal metastatic disease cannot be excluded. 1.1 cm hypermetabolic nodule in the right parotid gland suspicious for primary parotid neoplasm.  08/27/2020, CT-guided biopsy of abdominal wall mass was obtained. Results showed metastatic adenocarcinoma, compatible with colon primary.  INTERVAL HISTORY 74 y.o. female  presents for follow-up of recurrent colon cancer. Patient was seen by me approximately 4 weeks ago.  At that time we discussed about the tumor board consensus of proceeding with systemic chemotherapy.  Patient was accompanied by daughter during the last visit.  Both patient and her daughter want to explore further surgical option prior to agreement on systemic chemotherapy. I discussed with Dr. Dahlia Byes for possible resectability of her local recurrence and  patient was evaluated by him and  was offered laparoscopic evaluation of peritoneum to see if any metastatic disease. Patient has surgery scheduled next week.  She also has agreed on getting medi port placed.  She reports increase of peri umbilical pain, no exacerbating factors. Fatigue is worse.     Review of Systems  Constitutional: Positive for fatigue. Negative for appetite change, chills, fever and unexpected weight change.  HENT:   Negative for hearing loss, lump/mass, nosebleeds and voice change.   Eyes: Negative for eye problems.  Respiratory: Negative for chest tightness, cough and shortness of breath.   Cardiovascular: Negative for chest pain and leg swelling.  Gastrointestinal: Positive for abdominal pain. Negative for abdominal distention, blood in stool, constipation, diarrhea and nausea.  Endocrine: Negative for hot flashes.  Genitourinary: Negative for difficulty urinating, dysuria and frequency.   Musculoskeletal: Negative for arthralgias, gait problem and myalgias.  Skin: Negative for itching, rash and wound.  Neurological: Negative for dizziness, extremity weakness, gait problem and headaches.       Chronic numbness and tingling of fingertips and toes.  Hematological: Negative for adenopathy. Does not bruise/bleed easily.  Psychiatric/Behavioral: Negative for confusion, decreased concentration and depression. The patient is not nervous/anxious.       MEDICAL HISTORY: Past Medical History:  Diagnosis Date  . Breast cancer, left ([place]) 2004   Lumpectomy and rad tx's.  . Chronic back pain   . Colon cancer ([place]) 2018  . COPD (chronic obstructive pulmonary disease) ([place])   . Degenerative disc disease, lumbar 04/2013  . Family history of adverse reaction to anesthesia    son arrested after anesthesia  . GERD (gastroesophageal reflux disease)   . Iron deficiency anemia 05/27/2017  . Personal history of chemotherapy 2018   Colon  . Personal history of radiation therapy     Breast 2004  . Personal history of radiation therapy 2018   Colon  . Postprocedural intraabdominal abscess 09/01/2018  . Small bowel obstruction ([place]) 04/16/2017  . Wears dentures    full upper    SURGICAL HISTORY: Past Surgical History:  Procedure Laterality Date  . APPENDECTOMY  1965  . BREAST EXCISIONAL BIOPSY Left 08/01/2003   lumpectomy rad 11/04-2/28/2005  . BREAST SURGERY    . CESAREAN SECTION     x3  . COLON SURGERY    . COLONOSCOPY W/ POLYPECTOMY    . COLONOSCOPY WITH PROPOFOL N/A 04/09/2018   Procedure: COLONOSCOPY WITH PROPOFOL;  Surgeon: Virgel Manifold, MD;  Location: [facility];  Service: Gastroenterology;  Laterality: N/A;  . COLONOSCOPY WITH PROPOFOL N/A 08/13/2019   Procedure: COLONOSCOPY WITH PROPOFOL;  Surgeon: Virgel Manifold, MD;  Location: [place];  Service: Endoscopy;  Laterality: N/A;  . COLOSTOMY TAKEDOWN N/A 06/25/2018   Procedure: COLOSTOMY TAKEDOWN;  Surgeon: Jules Husbands, MD;  Location: [facility];  Service: General;  Laterality: N/A;  . ESOPHAGOGASTRODUODENOSCOPY (EGD) WITH PROPOFOL N/A 04/04/2017   Procedure: ESOPHAGOGASTRODUODENOSCOPY (EGD) WITH PROPOFOL;  Surgeon: [name], MD;  Location: [place];  Service: Endoscopy;  Laterality: N/A;  . FRACTURE SURGERY    . HIP FRACTURE SURGERY Left 01/25/2012  . LAPAROTOMY N/A 04/15/2017   Procedure: EXPLORATORY LAPAROTOMY;  Surgeon: Clayburn Pert, MD;  Location: [facility];  Service: General;  Laterality: N/A;  . NECK SURGERY  12/2011  . PARASTOMAL HERNIA REPAIR N/A 12/03/2017   Procedure: HERNIA REPAIR PARASTOMAL;  Surgeon: Jules Husbands, MD;  Location: [facility];  Service: General;  Laterality: N/A;  . PARASTOMAL HERNIA REPAIR N/A 06/25/2018   Procedure:  HERNIA REPAIR PARASTOMAL;  Surgeon: Jules Husbands, MD;  Location: [facility];  Service: General;  Laterality: N/A;  . PORTACATH PLACEMENT Right 05/21/2017   Procedure: INSERTION PORT-A-CATH;  Surgeon: Clayburn Pert, MD;   Location: [facility];  Service: General;  Laterality: Right;  . WRIST FRACTURE SURGERY      SOCIAL HISTORY: Social History   Socioeconomic History  . Marital status: Divorced    Spouse name: Not on file  . Number of children: 3  . Years of education: Not on file  . Highest education level: 7th grade  Occupational History  . Occupation: retired  Tobacco Use  . Smoking status: Former Smoker    Packs/day: 0.25    Years: 55.00    Pack years: 13.75    Types: Cigarettes    Quit date: 05/21/2017    Years since quitting: 2.5  . Smokeless tobacco: Never Used  . Tobacco comment: started age 14 1/3 to 1 ppd  Substance and Sexual Activity  . Alcohol use: Yes    Alcohol/week: 0.0 standard drinks    Comment: occasionally drinks beer  . Drug use: No  . Sexual activity: Never  Other Topics Concern  . Not on file  Social History Narrative   Lives at home alone   Social Determinants of Health   Financial Resource Strain: Medium Risk  . Difficulty of Paying Living Expenses: Somewhat hard  Food Insecurity:   . Worried About Charity fundraiser in the Last Year: Not on file  . Ran Out of Food in the Last Year: Not on file  Transportation Needs:   . Lack of Transportation (Medical): Not on file  . Lack of Transportation (Non-Medical): Not on file  Physical Activity: Inactive  . Days of Exercise per Week: 0 days  . Minutes of Exercise per Session: 0 min  Stress:   . Feeling of Stress : Not on file  Social Connections: Unknown  . Frequency of Communication with Friends and Family: Patient refused  . Frequency of Social Gatherings with Friends and Family: Patient refused  . Attends Religious Services: Patient refused  . Active Member of Clubs or Organizations: Patient refused  . Attends Archivist Meetings: Patient refused  . Marital Status: Patient refused  Intimate Partner Violence: Unknown  . Fear of Current or Ex-Partner: Patient refused  . Emotionally Abused: Patient  refused  . Physically Abused: Patient refused  . Sexually Abused: Patient refused    FAMILY HISTORY Family History  Problem Relation Age of Onset  . Diabetes Mother        type 2  . Coronary artery disease Mother   . Deep vein thrombosis Mother   . Colon cancer Father   . Asthma Brother   . Diabetes Brother        type 2  . Breast cancer Neg Hx     ALLERGIES:  is allergic to betadine [povidone iodine]; iodine; other; and sinus formula [cholestatin].  MEDICATIONS:  Current Outpatient Medications  Medication Sig Dispense Refill  . alendronate (FOSAMAX) 70 MG tablet Take 1 tablet (70 mg total) by mouth every 7 (seven) days. Take with a full glass of water on an empty stomach. 4 tablet 11  . budesonide-formoterol (SYMBICORT) 80-4.5 MCG/ACT inhaler Inhale 2 puffs into the lungs 2 (two) times daily. 3 Inhaler 3  . calcium citrate-vitamin D (CITRACAL+D) 315-200 MG-UNIT tablet Take 1 tablet by mouth daily.    . Cholecalciferol (VITAMIN D3) 50 MCG (2000 UT) TABS Take  2,000 Units by mouth daily.    Marland Kitchen docusate sodium (COLACE) 100 MG capsule Take 100 mg by mouth daily.    Marland Kitchen gabapentin (NEURONTIN) 400 MG capsule TAKE 1 CAPSULE THREE TIMES DAILY (Patient taking differently: Take 400 mg by mouth 3 (three) times daily. ) 270 capsule 1  . meloxicam (MOBIC) 15 MG tablet TAKE 1 TABLET EVERY DAY AS NEEDED (Patient taking differently: Take 15 mg by mouth daily as needed for pain. ) 90 tablet 3  . montelukast (SINGULAIR) 10 MG tablet TAKE 1 TABLET BY MOUTH EVERY DAY (Patient taking differently: Take 10 mg by mouth daily. ) 90 tablet 0  . Multiple Vitamins-Minerals (WOMENS MULTI VITAMIN & MINERAL PO) Take 2 tablets by mouth daily.     Marland Kitchen omeprazole (PRILOSEC) 20 MG capsule Take 1 capsule (20 mg total) by mouth daily. 90 capsule 4  . ondansetron (ZOFRAN) 4 MG tablet TAKE 1/2 TO 1 TABLET EVERY 8 HOURS AS NEEDED FOR NAUSEA OR VOMITING (Patient taking differently: Take 2-4 mg by mouth every 8 (eight) hours as  needed for nausea or vomiting. ) 20 tablet 2  . pravastatin (PRAVACHOL) 40 MG tablet TAKE 1 TABLET EVERY DAY (Patient taking differently: Take 40 mg by mouth daily. ) 90 tablet 4  . Probiotic Product (PROBIOTIC DAILY PO) Take 1 capsule by mouth daily.     . Triamcinolone Acetonide (NASACORT ALLERGY 24HR NA) Place 2 sprays into the nose daily as needed (allergies).     Marland Kitchen HYDROcodone-acetaminophen (NORCO) 7.5-325 MG tablet Take 1-2 tablets by mouth every 6 (six) hours as needed for moderate pain. 120 tablet 0   No current facility-administered medications for this visit.    PHYSICAL EXAMINATION:  ECOG PERFORMANCE STATUS: 1 - Symptomatic but completely ambulatory  Vitals:   11/29/19 1359  BP: (!) 144/78  Pulse: 92  Temp: (!) 97.1 F (36.2 C)   Filed Weights   11/29/19 1359  Weight: 162 lb 9.6 oz (73.8 kg)    Physical Exam  Constitutional: She is oriented to person, place, and time. No distress.  HENT:  Head: Normocephalic and atraumatic.  Mouth/Throat: No oropharyngeal exudate.  Eyes: Pupils are equal, round, and reactive to light. EOM are normal. No scleral icterus.  Cardiovascular: Normal rate and regular rhythm.  No murmur heard. Pulmonary/Chest: Effort normal. No respiratory distress. She has no rales. She exhibits no tenderness.  Abdominal: Soft. She exhibits no distension.  Palpable abdominal wall mass, with tenderness. Colostomy  Musculoskeletal:        General: No edema. Normal range of motion.     Cervical back: Normal range of motion and neck supple.  Neurological: She is alert and oriented to person, place, and time.  Skin: Skin is warm and dry. She is not diaphoretic. No erythema.  Psychiatric: Affect normal.      LABORATORY DATA: I have personally reviewed the data as listed: CBC Latest Ref Rng & Units 11/29/2019 09/01/2019 06/02/2019  WBC 4.0 - 10.5 K/uL 11.7(H) 11.4(H) 10.1  Hemoglobin 12.0 - 15.0 g/dL 10.1(L) 13.2 12.4  Hematocrit 36.0 - 46.0 % 32.7(L) 42.8  38.3  Platelets 150 - 400 K/uL 459(H) 381 347   CMP Latest Ref Rng & Units 11/29/2019 09/01/2019 06/02/2019  Glucose 70 - 99 mg/dL 140(H) 101(H) 88  BUN 8 - 23 mg/dL '19 17 15  ' Creatinine 0.44 - 1.00 mg/dL 0.75 0.66 0.62  Sodium 135 - 145 mmol/L 135 136 136  Potassium 3.5 - 5.1 mmol/L 3.8 4.1 3.6  Chloride  98 - 111 mmol/L 101 101 102  CO2 22 - 32 mmol/L '23 25 25  ' Calcium 8.9 - 10.3 mg/dL 8.5(L) 9.1 8.8(L)  Total Protein 6.5 - 8.1 g/dL 7.1 7.7 7.6  Total Bilirubin 0.3 - 1.2 mg/dL 0.3 0.4 0.4  Alkaline Phos 38 - 126 U/L 92 73 66  AST 15 - 41 U/L 14(L) 12(L) 14(L)  ALT 0 - 44 U/L '13 10 10   ' RADIOGRAPHIC STUDIES: I have personally reviewed the radiological images as listed and agreed with the findings in the report. DG Ribs Unilateral Left  Result Date: 10/06/2019 CLINICAL DATA:  Left rib pain, marked with BB EXAM: LEFT RIBS - 2 VIEW COMPARISON:  Radiograph 09/01/2018, CT 05/20/2018 FINDINGS: Radiopaque BB marker projects over the lateral aspect of the tenth rib. There is a minimally displaced posterolateral left seventh rib fracture. No other evident rib fractures. No pneumothorax or effusion. Chronic bronchitic and coarsened interstitial changes in the lungs are similar to prior. Portions of the right lung are collimated. The aorta is calcified. The remaining cardiomediastinal contours are unremarkable. IMPRESSION: 1. Minimally displaced posterolateral left seventh rib fracture. 2. No pneumothorax or effusion. 3. Chronic bronchitic and coarsened interstitial changes in the lungs are similar to prior. 4.  Aortic Atherosclerosis (ICD10-I70.0). Electronically Signed   By: Lovena Le M.D.   On: 10/06/2019 05:08   NM PET Image Restag (PS) Skull Base To Thigh  Result Date: 09/21/2019 CLINICAL DATA:  Subsequent treatment strategy for colon carcinoma. Rising CEA level. EXAM: NUCLEAR MEDICINE PET SKULL BASE TO THIGH TECHNIQUE: 8.7 mCi F-18 FDG was injected intravenously. Full-ring PET imaging was  performed from the skull base to thigh after the radiotracer. CT data was obtained and used for attenuation correction and anatomic localization. Fasting blood glucose: 111 mg/dl COMPARISON:  Most recent chest CT on 03/03/2019 and AP CT on 06/01/2019 FINDINGS: Mediastinal blood-pool activity (background): SUV max = 2.3 Liver activity (reference): SUV max = N/A NECK: 1.1 cm hypermetabolic mass is seen in the right parotid gland. This has SUV max of 11.4, and is suspicious for primary parotid neoplasm. No hypermetabolic lymph nodes identified. Incidental CT findings:  None. CHEST: No hypermetabolic masses or lymphadenopathy. No suspicious pulmonary nodules seen on CT images. Incidental CT findings: Stable mild post radiation changes in left upper lobe. ABDOMEN/PELVIS: No abnormal hypermetabolic activity within the liver, pancreas, adrenal glands, or spleen. Bilateral lower attenuation adrenal masses are stable and show absence of hypermetabolic activity, consistent with benign adrenal adenomas. Postop changes are again seen from right hemicolectomy. A 1.0 cm irregular nodular density is again seen in the mesenteric fat adjacent to the enterocolonic anastomosis on image 192/3. This is not significant changed in size compared to prior studies, but does show hypermetabolic activity, with SUV max of 3.3. Another soft tissue nodule measuring 1.5 cm with a central focus of calcification is seen in the anterior right lower quadrant mesentery on image 209/3. This is also stable in size, but is hypermetabolic, with SUV max of 10.9. Incidental CT findings:  None. SKELETON: No focal hypermetabolic bone lesions to suggest skeletal metastasis. Incidental CT findings:  None. IMPRESSION: Two small right abdominal mesenteric soft tissue nodules show no significant change in size compared to recent CTs, but are hypermetabolic. Peritoneal metastatic disease cannot be excluded. No other definite sites of metastatic disease identified.  1.1 cm hypermetabolic nodule in the right parotid gland, suspicious for primary parotid neoplasm. Consider ENT consultation. Electronically Signed   By: Myles Rosenthal.D.  On: 09/21/2019 16:41   [place] CORE BIOPSY (SOFT TISSUE)  Result Date: 10/28/2019 CLINICAL DATA:  History of colon carcinoma and status post transverse colectomy and prior treatment with chemotherapy and radiation therapy. Recent imaging has demonstrated new omental, peritoneal and abdominal wall soft tissue nodules which are hypermetabolic on PET scan and suspicious for recurrent disease. EXAM: ULTRASOUND GUIDED CORE BIOPSY OF ABDOMINAL WALL MASS MEDICATIONS: 1.0 mg IV Versed; 50 mcg IV Fentanyl Total Moderate Sedation Time: 12 minutes. The patient's level of consciousness and physiologic status were continuously monitored during the procedure by Radiology nursing. PROCEDURE: The procedure, risks, benefits, and alternatives were explained to the patient. Questions regarding the procedure were encouraged and answered. The patient understands and consents to the procedure. A time out was performed prior to initiating the procedure. Ultrasound was performed of the abdominal wall and anterior peritoneal cavity. The anterior abdominal wall was prepped with chlorhexidine in a sterile fashion, and a sterile drape was applied covering the operative field. A sterile gown and sterile gloves were used for the procedure. Local anesthesia was provided with 1% Lidocaine. Core biopsy of an anterior abdominal wall mass was performed with an 18 gauge core biopsy device. Four separate core biopsy samples were obtained and submitted in formalin. COMPLICATIONS: None. FINDINGS: Irregular lobulated hypoechoic soft tissue mass is seen involving the right lower abdominal wall and corresponding to one of the lesions that demonstrated increased metabolic activity by PET scan. This area measures approximately 3.2 x 2.1 x 2.6 cm by ultrasound. Solid tissue was obtained with  biopsy. IMPRESSION: Ultrasound-guided core biopsy performed of a right lower abdominal wall mass measuring 3 cm in estimated maximal diameter. Electronically Signed   By: Aletta Edouard M.D.   On: 10/28/2019 12:02      ASSESSMENT/PLAN 74yo female who has recurrent Colon Cancer presents for follow up  Cancer Staging Malignant neoplasm of transverse colon [facility]) Staging form: Colon and Rectum, AJCC 8th Edition - Clinical stage from 04/15/2017: Stage IIIB (cT4a, cN1b, cM0) - Signed by Earlie Server, MD on 04/26/2017  1. Malignant neoplasm of transverse colon ([place])   2. Personal history of malignant neoplasm of breast   3. Goals of care, counseling/discussion   4. Iron deficiency anemia due to chronic blood loss   5. Neoplasm related pain    #History of stage IIIB colon cancer -03/2017 now with recurrent disease I abdominal wall and mesentery nodule.   Suspect peritoneal metastasis. I have previously discussed with patient about proceeding with systemic chemotherapy using FOLFIRI and Avastin. Patient prefers to discuss with surgery to further explore possibility of resectability. I reviewed Dr. Corlis Leak note.  For R0 resection, patient will have to undergo major abdominal wall excision and resection with reconstruction. Dr. Dahlia Byes will perform laparoscopic evaluation of her peritoneum.  Today patient tells me that after she discussed with Dr.Pabon, she feels she is likely not want to have such a major surgery even if there is no peritoneum spread and recurrence is potentially resectable.   I discussed with patient that the time spent on further surgical evaluation is worthwhile only if she would agree with disease resection surgery, otherwise, if she does not want to proceed with surgery anyway, she may start on systemic chemotherapy treatment ASAP.  Patient still prefers to proceed with diagnostic laparoscopic evaluation and then decide what she wants to do after the procedure.  #Normocytic anemia,  hemoglobin has dropped to 10.1.  Iron panel was added to today's blood work for further evaluation.  Iron panel is consistent with iron deficiency anemia. I recommend patient to receive treatments 200 mg x3 to improve her blood counts. #Neoplasm induced pain, advised patient to use Norco 7.5/325 mg every 6 hours as needed for moderate pain.  Prescription was electronically sent to the pharmacy. Advised patient to use stool softener for constipation prophylaxis.  # History of breast cancer, need mammogram in March 2021 # Parotid nodule with hypermetabolism. Will refer to ENT in the future. Her evaluation and treatment of colon caner takes priority.   # Leukocytosis, intermittent monocytosis, she declined bone marrow biopsy on 05/22/2018. Monitor.  Orders Placed This Encounter  Procedures  . Ferritin    Standing Status:   Future    Number of Occurrences:   1    Standing Expiration Date:   11/28/2020  . Iron and TIBC    Standing Status:   Future    Number of Occurrences:   1    Standing Expiration Date:   11/28/2020   follow up to be determined   Earlie Server, MD, PhD Hematology Oncology [facility] at [place]- [phone] 11/30/19  Addendum:  NGS came back positive for BRAF mutation and ATM mutation.  Cetuximab and enorafenib can be an option for her. Will discuss with her at next visit.   Earlie Server

## 2019-12-01 ENCOUNTER — Telehealth: Payer: Self-pay

## 2019-12-01 ENCOUNTER — Encounter: Payer: Self-pay | Admitting: Internal Medicine

## 2019-12-01 NOTE — Telephone Encounter (Signed)
Yes, new.  Sorry and thank you!

## 2019-12-01 NOTE — Telephone Encounter (Signed)
-----   Message from Earlie Server, MD sent at 11/30/2019 11:35 PM EST ----- Please let patient know that her iron levels are low.  Recommend patient to proceed with IV Venofer weekly x3 Follow-up to be determined.

## 2019-12-01 NOTE — Telephone Encounter (Signed)
Done.Marland KitchenMarland KitchenMarland KitchenWill the 1st dose be NEW? Pt is aware

## 2019-12-01 NOTE — Telephone Encounter (Signed)
Left message informing MD recommendation.

## 2019-12-03 ENCOUNTER — Other Ambulatory Visit
Admission: RE | Admit: 2019-12-03 | Discharge: 2019-12-03 | Disposition: A | Discharge status: Home / Self Care | Payer: Medicare HMO | Source: Ambulatory Visit | Attending: Surgery | Admitting: Surgery

## 2019-12-03 DIAGNOSIS — Z01812 Encounter for preprocedural laboratory examination: Secondary | ICD-10-CM | POA: Insufficient documentation

## 2019-12-03 DIAGNOSIS — Z20822 Contact with and (suspected) exposure to covid-19: Secondary | ICD-10-CM | POA: Diagnosis not present

## 2019-12-03 LAB — SARS CORONAVIRUS 2 (TAT 6-24 HRS): SARS Coronavirus 2: NEGATIVE

## 2019-12-07 ENCOUNTER — Ambulatory Visit: Payer: Medicare HMO

## 2019-12-07 ENCOUNTER — Encounter: Payer: Self-pay | Admitting: Surgery

## 2019-12-07 ENCOUNTER — Ambulatory Visit: Payer: Medicare HMO | Admitting: Registered Nurse

## 2019-12-07 ENCOUNTER — Ambulatory Visit (HOSPITAL_BASED_OUTPATIENT_CLINIC_OR_DEPARTMENT_OTHER)
Admission: EM | Admit: 2019-12-07 | Discharge: 2019-12-07 | Disposition: A | Discharge status: Home / Self Care | Payer: Medicare HMO | Source: Ambulatory Visit | Attending: Surgery | Admitting: Surgery

## 2019-12-07 ENCOUNTER — Other Ambulatory Visit: Payer: Self-pay

## 2019-12-07 ENCOUNTER — Encounter
Admission: EM | Disposition: A | Discharge status: Home / Self Care | Payer: Self-pay | Source: Ambulatory Visit | Attending: Surgery

## 2019-12-07 DIAGNOSIS — Z95828 Presence of other vascular implants and grafts: Secondary | ICD-10-CM

## 2019-12-07 DIAGNOSIS — Z03818 Encounter for observation for suspected exposure to other biological agents ruled out: Secondary | ICD-10-CM | POA: Diagnosis not present

## 2019-12-07 DIAGNOSIS — E785 Hyperlipidemia, unspecified: Secondary | ICD-10-CM | POA: Diagnosis present

## 2019-12-07 DIAGNOSIS — Z7983 Long term (current) use of bisphosphonates: Secondary | ICD-10-CM | POA: Diagnosis not present

## 2019-12-07 DIAGNOSIS — Z8249 Family history of ischemic heart disease and other diseases of the circulatory system: Secondary | ICD-10-CM | POA: Diagnosis not present

## 2019-12-07 DIAGNOSIS — Z791 Long term (current) use of non-steroidal anti-inflammatories (NSAID): Secondary | ICD-10-CM | POA: Diagnosis not present

## 2019-12-07 DIAGNOSIS — Z452 Encounter for adjustment and management of vascular access device: Secondary | ICD-10-CM | POA: Diagnosis not present

## 2019-12-07 DIAGNOSIS — C50912 Malignant neoplasm of unspecified site of left female breast: Secondary | ICD-10-CM | POA: Diagnosis not present

## 2019-12-07 DIAGNOSIS — C184 Malignant neoplasm of transverse colon: Secondary | ICD-10-CM | POA: Diagnosis present

## 2019-12-07 DIAGNOSIS — D473 Essential (hemorrhagic) thrombocythemia: Secondary | ICD-10-CM | POA: Diagnosis not present

## 2019-12-07 DIAGNOSIS — C786 Secondary malignant neoplasm of retroperitoneum and peritoneum: Secondary | ICD-10-CM | POA: Diagnosis present

## 2019-12-07 DIAGNOSIS — Z923 Personal history of irradiation: Secondary | ICD-10-CM | POA: Diagnosis not present

## 2019-12-07 DIAGNOSIS — M549 Dorsalgia, unspecified: Secondary | ICD-10-CM | POA: Diagnosis present

## 2019-12-07 DIAGNOSIS — Z87891 Personal history of nicotine dependence: Secondary | ICD-10-CM | POA: Diagnosis not present

## 2019-12-07 DIAGNOSIS — Z20822 Contact with and (suspected) exposure to covid-19: Secondary | ICD-10-CM | POA: Diagnosis present

## 2019-12-07 DIAGNOSIS — Z800 Family history of malignant neoplasm of digestive organs: Secondary | ICD-10-CM | POA: Diagnosis not present

## 2019-12-07 DIAGNOSIS — D500 Iron deficiency anemia secondary to blood loss (chronic): Secondary | ICD-10-CM | POA: Diagnosis not present

## 2019-12-07 DIAGNOSIS — I2510 Atherosclerotic heart disease of native coronary artery without angina pectoris: Secondary | ICD-10-CM | POA: Diagnosis present

## 2019-12-07 DIAGNOSIS — I4891 Unspecified atrial fibrillation: Secondary | ICD-10-CM | POA: Diagnosis present

## 2019-12-07 DIAGNOSIS — R7989 Other specified abnormal findings of blood chemistry: Secondary | ICD-10-CM | POA: Diagnosis not present

## 2019-12-07 DIAGNOSIS — Z9981 Dependence on supplemental oxygen: Secondary | ICD-10-CM | POA: Diagnosis not present

## 2019-12-07 DIAGNOSIS — K219 Gastro-esophageal reflux disease without esophagitis: Secondary | ICD-10-CM | POA: Diagnosis present

## 2019-12-07 DIAGNOSIS — G8929 Other chronic pain: Secondary | ICD-10-CM | POA: Diagnosis present

## 2019-12-07 DIAGNOSIS — J9811 Atelectasis: Secondary | ICD-10-CM | POA: Diagnosis not present

## 2019-12-07 DIAGNOSIS — K660 Peritoneal adhesions (postprocedural) (postinfection): Secondary | ICD-10-CM | POA: Diagnosis present

## 2019-12-07 DIAGNOSIS — C189 Malignant neoplasm of colon, unspecified: Secondary | ICD-10-CM | POA: Diagnosis not present

## 2019-12-07 DIAGNOSIS — Z853 Personal history of malignant neoplasm of breast: Secondary | ICD-10-CM | POA: Diagnosis not present

## 2019-12-07 DIAGNOSIS — Z7951 Long term (current) use of inhaled steroids: Secondary | ICD-10-CM | POA: Diagnosis not present

## 2019-12-07 DIAGNOSIS — Z9221 Personal history of antineoplastic chemotherapy: Secondary | ICD-10-CM | POA: Diagnosis not present

## 2019-12-07 DIAGNOSIS — G4734 Idiopathic sleep related nonobstructive alveolar hypoventilation: Secondary | ICD-10-CM | POA: Diagnosis not present

## 2019-12-07 DIAGNOSIS — M5136 Other intervertebral disc degeneration, lumbar region: Secondary | ICD-10-CM | POA: Diagnosis present

## 2019-12-07 DIAGNOSIS — Z825 Family history of asthma and other chronic lower respiratory diseases: Secondary | ICD-10-CM | POA: Diagnosis not present

## 2019-12-07 DIAGNOSIS — I480 Paroxysmal atrial fibrillation: Secondary | ICD-10-CM | POA: Diagnosis present

## 2019-12-07 DIAGNOSIS — R1084 Generalized abdominal pain: Secondary | ICD-10-CM | POA: Diagnosis not present

## 2019-12-07 DIAGNOSIS — Z833 Family history of diabetes mellitus: Secondary | ICD-10-CM | POA: Diagnosis not present

## 2019-12-07 DIAGNOSIS — J449 Chronic obstructive pulmonary disease, unspecified: Secondary | ICD-10-CM | POA: Diagnosis present

## 2019-12-07 DIAGNOSIS — D72829 Elevated white blood cell count, unspecified: Secondary | ICD-10-CM | POA: Diagnosis not present

## 2019-12-07 HISTORY — PX: LAPAROSCOPY: SHX197

## 2019-12-07 HISTORY — PX: PORTACATH PLACEMENT: SHX2246

## 2019-12-07 SURGERY — INSERTION, TUNNELED CENTRAL VENOUS DEVICE, WITH PORT
Anesthesia: General | Site: Chest | Laterality: Right

## 2019-12-07 MED ORDER — KETAMINE HCL 10 MG/ML IJ SOLN
INTRAMUSCULAR | Status: DC | PRN
  Administered 2019-12-07: 30 mg via INTRAVENOUS

## 2019-12-07 MED ORDER — DEXAMETHASONE SODIUM PHOSPHATE 10 MG/ML IJ SOLN
INTRAMUSCULAR | Status: AC
  Filled 2019-12-07: qty 1

## 2019-12-07 MED ORDER — ACETAMINOPHEN 500 MG PO TABS
1000.0000 mg | ORAL_TABLET | ORAL | Status: AC
  Administered 2019-12-07: 1000 mg via ORAL

## 2019-12-07 MED ORDER — ONDANSETRON HCL 4 MG/2ML IJ SOLN
INTRAMUSCULAR | Status: AC
  Filled 2019-12-07: qty 2

## 2019-12-07 MED ORDER — FENTANYL CITRATE (PF) 250 MCG/5ML IJ SOLN
INTRAMUSCULAR | Status: AC
  Filled 2019-12-07: qty 5

## 2019-12-07 MED ORDER — ONDANSETRON HCL 4 MG/2ML IJ SOLN: 4.0000 mg | Freq: Once | INTRAMUSCULAR | Status: DC | PRN

## 2019-12-07 MED ORDER — LACTATED RINGERS IV SOLN: INTRAVENOUS | Status: DC

## 2019-12-07 MED ORDER — LIDOCAINE HCL (PF) 2 % IJ SOLN
INTRAMUSCULAR | Status: AC
  Filled 2019-12-07: qty 10

## 2019-12-07 MED ORDER — BUPIVACAINE HCL (PF) 0.25 % IJ SOLN
INTRAMUSCULAR | Status: AC
  Filled 2019-12-07: qty 30

## 2019-12-07 MED ORDER — EPINEPHRINE PF 1 MG/ML IJ SOLN
INTRAMUSCULAR | Status: AC
  Filled 2019-12-07: qty 1

## 2019-12-07 MED ORDER — CEFAZOLIN SODIUM-DEXTROSE 2-4 GM/100ML-% IV SOLN
2.0000 g | INTRAVENOUS | Status: AC
  Administered 2019-12-07: 2 g via INTRAVENOUS

## 2019-12-07 MED ORDER — EPHEDRINE SULFATE 50 MG/ML IJ SOLN
INTRAMUSCULAR | Status: AC
  Filled 2019-12-07: qty 1

## 2019-12-07 MED ORDER — DEXAMETHASONE SODIUM PHOSPHATE 10 MG/ML IJ SOLN
INTRAMUSCULAR | Status: DC | PRN
  Administered 2019-12-07: 10 mg via INTRAVENOUS

## 2019-12-07 MED ORDER — LIDOCAINE-PRILOCAINE 2.5-2.5 % EX CREA: 1.0000 "application " | TOPICAL_CREAM | CUTANEOUS | 1 refills | Status: DC | PRN

## 2019-12-07 MED ORDER — KETAMINE HCL 50 MG/ML IJ SOLN
INTRAMUSCULAR | Status: AC
  Filled 2019-12-07: qty 10

## 2019-12-07 MED ORDER — KETOROLAC TROMETHAMINE 30 MG/ML IJ SOLN
INTRAMUSCULAR | Status: AC
  Filled 2019-12-07: qty 1

## 2019-12-07 MED ORDER — LIDOCAINE HCL (PF) 1 % IJ SOLN
INTRAMUSCULAR | Status: AC
  Filled 2019-12-07: qty 30

## 2019-12-07 MED ORDER — IPRATROPIUM-ALBUTEROL 0.5-2.5 (3) MG/3ML IN SOLN
3.0000 mL | RESPIRATORY_TRACT | Status: DC
  Administered 2019-12-07: 3 mL via RESPIRATORY_TRACT

## 2019-12-07 MED ORDER — HYDROCODONE-ACETAMINOPHEN 5-325 MG PO TABS: 1.0000 | ORAL_TABLET | Freq: Four times a day (QID) | ORAL | 0 refills | Status: DC | PRN

## 2019-12-07 MED ORDER — LIDOCAINE HCL (CARDIAC) PF 100 MG/5ML IV SOSY
PREFILLED_SYRINGE | INTRAVENOUS | Status: DC | PRN
  Administered 2019-12-07: 60 mg via INTRAVENOUS

## 2019-12-07 MED ORDER — CHLORHEXIDINE GLUCONATE CLOTH 2 % EX PADS
6.0000 | MEDICATED_PAD | Freq: Once | CUTANEOUS | Status: AC
  Administered 2019-12-07: 6 via TOPICAL

## 2019-12-07 MED ORDER — SODIUM CHLORIDE (PF) 0.9 % IJ SOLN
INTRAMUSCULAR | Status: AC
  Filled 2019-12-07: qty 10

## 2019-12-07 MED ORDER — EPHEDRINE SULFATE 50 MG/ML IJ SOLN
INTRAMUSCULAR | Status: DC | PRN
  Administered 2019-12-07: 10 mg via INTRAVENOUS

## 2019-12-07 MED ORDER — PROPOFOL 10 MG/ML IV BOLUS
INTRAVENOUS | Status: DC | PRN
  Administered 2019-12-07: 120 mg via INTRAVENOUS

## 2019-12-07 MED ORDER — IPRATROPIUM-ALBUTEROL 0.5-2.5 (3) MG/3ML IN SOLN
RESPIRATORY_TRACT | Status: AC
  Filled 2019-12-07: qty 3

## 2019-12-07 MED ORDER — ROCURONIUM BROMIDE 100 MG/10ML IV SOLN
INTRAVENOUS | Status: DC | PRN
  Administered 2019-12-07: 10 mg via INTRAVENOUS
  Administered 2019-12-07: 20 mg via INTRAVENOUS
  Administered 2019-12-07: 40 mg via INTRAVENOUS

## 2019-12-07 MED ORDER — SODIUM CHLORIDE 0.9 % IV SOLN
INTRAVENOUS | Status: DC | PRN
  Administered 2019-12-07: 5 mL via INTRAMUSCULAR

## 2019-12-07 MED ORDER — SUGAMMADEX SODIUM 200 MG/2ML IV SOLN
INTRAVENOUS | Status: AC
  Filled 2019-12-07: qty 2

## 2019-12-07 MED ORDER — SODIUM CHLORIDE FLUSH 0.9 % IV SOLN
INTRAVENOUS | Status: AC
  Filled 2019-12-07: qty 10

## 2019-12-07 MED ORDER — FENTANYL CITRATE (PF) 100 MCG/2ML IJ SOLN
INTRAMUSCULAR | Status: AC
  Filled 2019-12-07: qty 2

## 2019-12-07 MED ORDER — ONDANSETRON HCL 4 MG/2ML IJ SOLN
INTRAMUSCULAR | Status: DC | PRN
  Administered 2019-12-07: 4 mg via INTRAVENOUS

## 2019-12-07 MED ORDER — CEFAZOLIN SODIUM-DEXTROSE 2-4 GM/100ML-% IV SOLN
INTRAVENOUS | Status: AC
  Filled 2019-12-07: qty 100

## 2019-12-07 MED ORDER — LIDOCAINE HCL (PF) 1 % IJ SOLN
INTRAMUSCULAR | Status: DC | PRN
  Administered 2019-12-07: 10 mL

## 2019-12-07 MED ORDER — ACETAMINOPHEN 500 MG PO TABS
ORAL_TABLET | ORAL | Status: AC
  Filled 2019-12-07: qty 2

## 2019-12-07 MED ORDER — FENTANYL CITRATE (PF) 100 MCG/2ML IJ SOLN
25.0000 ug | INTRAMUSCULAR | Status: DC | PRN
  Administered 2019-12-07 (×4): 25 ug via INTRAVENOUS

## 2019-12-07 MED ORDER — FENTANYL CITRATE (PF) 100 MCG/2ML IJ SOLN
INTRAMUSCULAR | Status: DC | PRN
  Administered 2019-12-07 (×4): 25 ug via INTRAVENOUS

## 2019-12-07 MED ORDER — ROCURONIUM BROMIDE 50 MG/5ML IV SOLN
INTRAVENOUS | Status: AC
  Filled 2019-12-07: qty 1

## 2019-12-07 MED ORDER — SUCCINYLCHOLINE CHLORIDE 20 MG/ML IJ SOLN
INTRAMUSCULAR | Status: AC
  Filled 2019-12-07: qty 1

## 2019-12-07 MED ORDER — PHENYLEPHRINE HCL (PRESSORS) 10 MG/ML IV SOLN
INTRAVENOUS | Status: AC
  Filled 2019-12-07: qty 1

## 2019-12-07 MED ORDER — KETOROLAC TROMETHAMINE 30 MG/ML IJ SOLN
INTRAMUSCULAR | Status: DC | PRN
  Administered 2019-12-07: 15 mg via INTRAVENOUS

## 2019-12-07 MED ORDER — SUGAMMADEX SODIUM 200 MG/2ML IV SOLN
INTRAVENOUS | Status: DC | PRN
  Administered 2019-12-07: 200 mg via INTRAVENOUS

## 2019-12-07 MED ORDER — HEPARIN SODIUM (PORCINE) 5000 UNIT/ML IJ SOLN
INTRAMUSCULAR | Status: AC
  Filled 2019-12-07: qty 1

## 2019-12-07 MED ORDER — GABAPENTIN 300 MG PO CAPS: 300.0000 mg | ORAL_CAPSULE | ORAL | Status: DC

## 2019-12-07 MED ORDER — BUPIVACAINE-EPINEPHRINE (PF) 0.25% -1:200000 IJ SOLN
INTRAMUSCULAR | Status: DC | PRN
  Administered 2019-12-07: 10 mL

## 2019-12-07 MED ORDER — PHENYLEPHRINE HCL (PRESSORS) 10 MG/ML IV SOLN
INTRAVENOUS | Status: DC | PRN
  Administered 2019-12-07 (×4): 100 ug via INTRAVENOUS

## 2019-12-07 SURGICAL SUPPLY — 61 items
"PENCIL ELECTRO HAND CTR " (MISCELLANEOUS) ×2 IMPLANT
BAG DECANTER FOR FLEXI CONT (MISCELLANEOUS) ×4 IMPLANT
BLADE SURG 15 STRL LF DISP TIS (BLADE) ×2 IMPLANT
BLADE SURG 15 STRL SS (BLADE) ×2
BLADE SURG SZ11 CARB STEEL (BLADE) ×4 IMPLANT
BOOT SUTURE AID YELLOW STND (SUTURE) ×4 IMPLANT
CANISTER SUCT 1200ML W/VALVE (MISCELLANEOUS) ×4 IMPLANT
CHLORAPREP W/TINT 26 (MISCELLANEOUS) ×4 IMPLANT
CLEANER CAUTERY TIP 5X5 PAD (MISCELLANEOUS) ×2 IMPLANT
CNTNR SPEC 2.5X3XGRAD LEK (MISCELLANEOUS) ×2
CONT SPEC 4OZ STER OR WHT (MISCELLANEOUS) ×2
CONTAINER SPEC 2.5X3XGRAD LEK (MISCELLANEOUS) ×2 IMPLANT
COVER LIGHT HANDLE STERIS (MISCELLANEOUS) ×8 IMPLANT
COVER WAND RF STERILE (DRAPES) ×4 IMPLANT
DERMABOND ADVANCED (GAUZE/BANDAGES/DRESSINGS) ×2
DERMABOND ADVANCED .7 DNX12 (GAUZE/BANDAGES/DRESSINGS) ×2 IMPLANT
DEVICE TROCAR PUNCTURE CLOSURE (ENDOMECHANICALS) ×4 IMPLANT
DRAPE 3/4 80X56 (DRAPES) ×4 IMPLANT
DRAPE C-ARM XRAY 36X54 (DRAPES) ×8 IMPLANT
DRAPE INCISE IOBAN 66X45 STRL (DRAPES) ×4 IMPLANT
DRSG TELFA 4X3 1S NADH ST (GAUZE/BANDAGES/DRESSINGS) ×4 IMPLANT
ELECT CAUTERY BLADE 6.4 (BLADE) ×4 IMPLANT
ELECT REM PT RETURN 9FT ADLT (ELECTROSURGICAL) ×4
ELECTRODE REM PT RTRN 9FT ADLT (ELECTROSURGICAL) ×2 IMPLANT
GEL ULTRASOUND 20GR AQUASONIC (MISCELLANEOUS) ×4 IMPLANT
GLOVE BIO SURGEON STRL SZ7 (GLOVE) ×4 IMPLANT
GOWN STRL REUS W/ TWL LRG LVL3 (GOWN DISPOSABLE) ×4 IMPLANT
GOWN STRL REUS W/TWL LRG LVL3 (GOWN DISPOSABLE) ×4
IRRIGATION STRYKERFLOW (MISCELLANEOUS) ×2 IMPLANT
IRRIGATOR STRYKERFLOW (MISCELLANEOUS) ×4
IV NS 1000ML (IV SOLUTION) ×2
IV NS 1000ML BAXH (IV SOLUTION) ×2 IMPLANT
IV NS 500ML (IV SOLUTION) ×2
IV NS 500ML BAXH (IV SOLUTION) ×2 IMPLANT
KIT PORT POWER 8FR ISP CVUE (Port) ×4 IMPLANT
L-HOOK LAP DISP 36CM (ELECTROSURGICAL) ×4
LHOOK LAP DISP 36CM (ELECTROSURGICAL) ×2 IMPLANT
NEEDLE HYPO 22GX1.5 SAFETY (NEEDLE) ×4 IMPLANT
NS IRRIG 1000ML POUR BTL (IV SOLUTION) ×4 IMPLANT
PACK LAP CHOLECYSTECTOMY (MISCELLANEOUS) ×4 IMPLANT
PACK PORT-A-CATH (MISCELLANEOUS) ×4 IMPLANT
PAD CLEANER CAUTERY TIP 5X5 (MISCELLANEOUS) ×2
PENCIL ELECTRO HAND CTR (MISCELLANEOUS) ×4 IMPLANT
SCISSORS METZENBAUM CVD 33 (INSTRUMENTS) ×4 IMPLANT
SET TUBE SMOKE EVAC HIGH FLOW (TUBING) ×4 IMPLANT
SLEEVE ADV FIXATION 5X100MM (TROCAR) ×8 IMPLANT
SPONGE LAP 18X18 RF (DISPOSABLE) ×4 IMPLANT
SUT MNCRL AB 4-0 PS2 18 (SUTURE) ×4 IMPLANT
SUT PROLENE 2-0 (SUTURE) ×2
SUT PROLENE 2-0 RB1 36X2 ARM (SUTURE) ×2
SUT VIC AB 3-0 SH 27 (SUTURE) ×2
SUT VIC AB 3-0 SH 27X BRD (SUTURE) ×2 IMPLANT
SUT VICRYL 0 AB UR-6 (SUTURE) ×4 IMPLANT
SUTURE PROLEN 2-0 RB1 36X2 ARM (SUTURE) ×2 IMPLANT
SYR 10ML LL (SYRINGE) ×4 IMPLANT
SYR 20ML LL LF (SYRINGE) ×4 IMPLANT
SYR 5ML LL (SYRINGE) ×4 IMPLANT
TOWEL OR 17X26 4PK STRL BLUE (TOWEL DISPOSABLE) ×4 IMPLANT
TRAP SPECIMEN MUCOUS 40CC (MISCELLANEOUS) ×4 IMPLANT
TROCAR XCEL BLUNT TIP 100MML (ENDOMECHANICALS) ×4 IMPLANT
TROCAR Z-THREAD OPTICAL 5X100M (TROCAR) ×4 IMPLANT

## 2019-12-07 NOTE — Op Note (Addendum)
Pre-operative Diagnosis: Metastatic colon cancer  Post-operative Diagnosis: Same   Surgeon: [name] Hamman, MD FACS  Anesthesia: IV sedation, marcaine .25% w epi and lidocaine 1%  Procedure: Right IJ  Port placement with fluoroscopy under U/S guidance Diagnostic laparoscopy with lysis of adhesions and peritoneal washings, peritoneal implant biopsy  Findings: Good position of the tip of the catheter by fluoroscopy Evidence of wide spread metastatic disease within peritoneum, Nodules ranging from 2cms in size to 5 mm Dense adhesions from the abdominal wall to the small bowel  Estimated Blood Loss: 10cc         Drains: None         Specimens: peritoneal implants and washing       Complications: none         Procedure Details  The patient was seen again in the Holding Room. The benefits, complications, treatment options, and expected outcomes were discussed with the patient. The risks of bleeding, infection, recurrence of symptoms, failure to resolve symptoms,  thrombosis nonfunction breakage pneumothorax hemopneumothorax any of which could require chest tube or further surgery were reviewed with the patient.   The patient was taken to Operating Room, identified as [name] and the procedure verified.  A Time Out was held and the above information confirmed.  Prior to the induction of general anesthesia, antibiotic prophylaxis was administered. VTE prophylaxis was in place. Appropriate anesthesia was then administered and tolerated well. The chest was prepped with Chloraprep and draped in the sterile fashion. The patient was positioned in the supine position. Then the patient was placed in Trendelenburg position.  Patient was prepped and draped in sterile fashion and in a Trendelenburg position local anesthetic was infiltrated into the skin and subcutaneous tissues in the neck and anterior chest wall. The large bore needle was placed into the internal jugular vein under U/S guidance  without difficulty and then the Seldinger wire was advanced. Fluoroscopy was utilized to confirm that the Seldinger wire was in the superior vena cava.  An incision was made and a port pocket developed with blunt and electrocautery dissection. The introducer dilator was placed over the Seldinger wire the wire was removed. The previously flushed catheter was placed into the introducer dilator and the peel-away sheath was removed. The catheter length was confirmed and trimmed utilizing fluoroscopy for proper positioning. The catheter was then attached to the previously flushed port. The port was placed into the pocket. The port was held in with 2-0 Prolenes and flushed for function and heparin locked.  The wound was closed with interrupted 3-0 Vicryl followed by 4-0 subcuticular Monocryl sutures. Dermabond used to coat the skin  Attention then was turned to the abdominal cavity where we used fresh instruments she was prepped and draped in the usual fashion.  I used a cutdown technique given that she had multiple operations and significant adhesions.  I perform incision in the left lower quadrant and incised the fascia and elevated.  The abdominal wall musculature were split and then the peritoneum was elevated between 2 Kocher clamps and incised.  I was able to access the peritoneal cavity under direct visualization I placed my finger and swept down some peritoneal adhesions. 2-0 Vicryl sutures were placed in the fascia and a Hassan trocar was inserted.  Pneumoperitoneum obtained with no evidence of any hemodynamic compromise. There was no evidence of any bowel injuries.  At this point I also inserted a second port cephalad to my cutdown.  This was a 5 mm port.  Lyse of adhesions was performed using scissors.  He is noted the lyse of adhesions was the procedure to the longus due to dense adhesions and due to the need for a good dissecting window, this allowed [place] to have reasonable exposure to the abdominal  cavity but there were dense adhesions and we encounter 2 big nodules within the peritoneal cavity one measuring 2 cm and the other one measuring 5 mm.  This was consistent with metastatic disease.  Appropriate peritoneal washings were performed and sent for cytology.  I also perform incisional biopsy of the peritoneal implants.  There was no evidence of any bowel injury.  Marcaine core percent with epinephrine was injected for postoperative analgesia and the fascia was closed with 3 interrupted 0 Vicryl sutures.  The skin incisions were closed with a 4-0 Monocryl in a subcuticular fashion.  Dermabond was placed on the incisions.  Patient was taken to the recovery room in stable condition where a postoperative chest film has been ordered.

## 2019-12-07 NOTE — Anesthesia Preprocedure Evaluation (Signed)
Anesthesia Evaluation  Patient identified by MRN, date of birth, ID band Patient awake    Reviewed: Allergy & Precautions, H&P , NPO status , Patient's Chart, lab work & pertinent test results  Airway Mallampati: II  TM Distance: >3 FB Neck ROM: full    Dental  (+) Edentulous Upper, Poor Dentition   Pulmonary COPD,  COPD inhaler, former smoker,    Pulmonary exam normal breath sounds clear to auscultation       Cardiovascular + CAD  Normal cardiovascular exam Rhythm:regular Rate:Normal     Neuro/Psych    GI/Hepatic GERD  ,  Endo/Other    Renal/GU      Musculoskeletal   Abdominal   Peds  Hematology   Anesthesia Other Findings   Reproductive/Obstetrics                             Anesthesia Physical  Anesthesia Plan  ASA: III  Anesthesia Plan: General   Post-op Pain Management:    Induction: Intravenous  PONV Risk Score and Plan: 3  Airway Management Planned: Oral ETT  Additional Equipment:   Intra-op Plan:   Post-operative Plan: Extubation in OR  Informed Consent: I have reviewed the patients History and Physical, chart, labs and discussed the procedure including the risks, benefits and alternatives for the proposed anesthesia with the patient or authorized representative who has indicated his/her understanding and acceptance.     Dental advisory given  Plan Discussed with: CRNA and Surgeon  Anesthesia Plan Comments:         Anesthesia Quick Evaluation

## 2019-12-07 NOTE — Transfer of Care (Signed)
Immediate Anesthesia Transfer of Care Note  Patient: Kimberly [name]  Procedure(s) Performed: INSERTION PORT-A-CATH (Right Chest) LAPAROSCOPY DIAGNOSTIC (N/A )  Patient Location: PACU  Anesthesia Type:General  Level of Consciousness: drowsy  Airway & Oxygen Therapy: Patient Spontanous Breathing and Patient connected to face mask oxygen  Post-op Assessment: Report given to RN and Post -op Vital signs reviewed and stable  Post vital signs: Reviewed and stable  Last Vitals:  Vitals Value Taken Time  BP 151/77 12/07/19 0948  Temp    Pulse 79 12/07/19 0953  Resp 13 12/07/19 0953  SpO2 89 % 12/07/19 0953  Vitals shown include unvalidated device data.  Last Pain:  Vitals:   12/07/19 0624  TempSrc: Tympanic  PainSc: 0-No pain         Complications: No apparent anesthesia complications

## 2019-12-07 NOTE — Anesthesia Procedure Notes (Addendum)
Procedure Name: Intubation Date/Time: 12/07/2019 7:41 AM Performed by: Lia Foyer, CRNA Pre-anesthesia Checklist: Patient identified, Emergency Drugs available, Suction available and Patient being monitored Patient Re-evaluated:Patient Re-evaluated prior to induction Oxygen Delivery Method: Circle system utilized Preoxygenation: Pre-oxygenation with 100% oxygen Induction Type: IV induction Ventilation: Mask ventilation without difficulty Laryngoscope Size: McGraph and 3 Grade View: Grade I Tube type: Oral Tube size: 7.0 mm Number of attempts: 1 Airway Equipment and Method: Stylet,  Oral airway and Video-laryngoscopy Placement Confirmation: ETT inserted through vocal cords under direct vision,  positive ETCO2 and breath sounds checked- equal and bilateral Secured at: 21 cm Tube secured with: Tape Dental Injury: Teeth and Oropharynx as per pre-operative assessment

## 2019-12-07 NOTE — Interval H&P Note (Signed)
History and Physical Interval Note:  12/07/2019 7:20 AM  Kimberly [name]  has presented today for surgery, with the diagnosis of Colon Cancer.  The various methods of treatment have been discussed with the patient and family. After consideration of risks, benefits and other options for treatment, the patient has consented to  Procedure(s): INSERTION PORT-A-CATH (N/A) LAPAROSCOPY DIAGNOSTIC (N/A) as a surgical intervention.  The patient's history has been reviewed, patient examined, no change in status, stable for surgery.  I have reviewed the patient's chart and labs.  Questions were answered to the patient's satisfaction.     [name]

## 2019-12-07 NOTE — Progress Notes (Addendum)
Spoke with Dr. Kayleen Memos as pt's oxygen 88-91% on room air. Incentive spirometry teaching given. Pt able to adequately use incentive spirometer. 95-97% on 2L. Pt states she wears oxygen at night. Per Dr. Kayleen Memos, ok to d/c pt on 2L. Daughter has gone home to get portable oxygen tank.   Paged Dr. Dahlia Byes at 11:15am as pt had right neck swelling. Ice pack applied. Pt rates pain 3 out of 10. No difficulty swallowing or breathing. Dr. Dahlia Byes came to the bedside to evaluate. Per MD, incision site acceptable and stable to discharge home. Will continue to assess.  Pt's daughter returned with oxygen tank but adapter not on.  Pt states she does not want daughter to go back home for the missing piece Unable to apply oxygen without piece. Pt in wheelchair, Continuous monitoring shows pt oxygen 96-97% on room air.

## 2019-12-07 NOTE — Discharge Instructions (Signed)

## 2019-12-08 ENCOUNTER — Other Ambulatory Visit: Payer: Self-pay

## 2019-12-08 ENCOUNTER — Inpatient Hospital Stay: Payer: Medicare HMO

## 2019-12-08 ENCOUNTER — Encounter: Payer: Self-pay | Admitting: Surgery

## 2019-12-08 ENCOUNTER — Ambulatory Visit (INDEPENDENT_AMBULATORY_CARE_PROVIDER_SITE_OTHER): Payer: Self-pay | Admitting: Surgery

## 2019-12-08 ENCOUNTER — Inpatient Hospital Stay
Admission: EM | Admit: 2019-12-08 | Discharge: 2019-12-10 | DRG: 982 | Disposition: A | Discharge status: Home / Self Care | Payer: Medicare HMO | Source: Ambulatory Visit | Attending: Internal Medicine | Admitting: Internal Medicine

## 2019-12-08 ENCOUNTER — Telehealth: Payer: Self-pay

## 2019-12-08 ENCOUNTER — Other Ambulatory Visit: Payer: Self-pay | Admitting: Surgery

## 2019-12-08 ENCOUNTER — Emergency Department: Payer: Medicare HMO

## 2019-12-08 ENCOUNTER — Inpatient Hospital Stay
Admission: RE | Admit: 2019-12-08 | Discharge: 2019-12-08 | Disposition: A | Discharge status: Home / Self Care | Payer: Medicare HMO | Source: Ambulatory Visit | Attending: Surgery | Admitting: Surgery

## 2019-12-08 ENCOUNTER — Encounter: Payer: Self-pay | Admitting: Emergency Medicine

## 2019-12-08 ENCOUNTER — Telehealth: Payer: Self-pay | Admitting: *Deleted

## 2019-12-08 VITALS — BP 138/83 | HR 156 | Temp 97.5°F | Resp 14 | Ht 64.0 in | Wt 161.0 lb

## 2019-12-08 DIAGNOSIS — G4734 Idiopathic sleep related nonobstructive alveolar hypoventilation: Secondary | ICD-10-CM | POA: Diagnosis not present

## 2019-12-08 DIAGNOSIS — Z8249 Family history of ischemic heart disease and other diseases of the circulatory system: Secondary | ICD-10-CM | POA: Diagnosis not present

## 2019-12-08 DIAGNOSIS — E785 Hyperlipidemia, unspecified: Secondary | ICD-10-CM | POA: Diagnosis present

## 2019-12-08 DIAGNOSIS — D473 Essential (hemorrhagic) thrombocythemia: Secondary | ICD-10-CM | POA: Diagnosis not present

## 2019-12-08 DIAGNOSIS — R109 Unspecified abdominal pain: Secondary | ICD-10-CM

## 2019-12-08 DIAGNOSIS — I480 Paroxysmal atrial fibrillation: Principal | ICD-10-CM | POA: Diagnosis present

## 2019-12-08 DIAGNOSIS — Z800 Family history of malignant neoplasm of digestive organs: Secondary | ICD-10-CM | POA: Diagnosis not present

## 2019-12-08 DIAGNOSIS — M549 Dorsalgia, unspecified: Secondary | ICD-10-CM | POA: Diagnosis present

## 2019-12-08 DIAGNOSIS — Z7951 Long term (current) use of inhaled steroids: Secondary | ICD-10-CM | POA: Diagnosis not present

## 2019-12-08 DIAGNOSIS — Z95828 Presence of other vascular implants and grafts: Secondary | ICD-10-CM

## 2019-12-08 DIAGNOSIS — Z791 Long term (current) use of non-steroidal anti-inflammatories (NSAID): Secondary | ICD-10-CM

## 2019-12-08 DIAGNOSIS — Z9221 Personal history of antineoplastic chemotherapy: Secondary | ICD-10-CM | POA: Diagnosis not present

## 2019-12-08 DIAGNOSIS — I2510 Atherosclerotic heart disease of native coronary artery without angina pectoris: Secondary | ICD-10-CM | POA: Diagnosis present

## 2019-12-08 DIAGNOSIS — D500 Iron deficiency anemia secondary to blood loss (chronic): Secondary | ICD-10-CM | POA: Diagnosis not present

## 2019-12-08 DIAGNOSIS — R1084 Generalized abdominal pain: Secondary | ICD-10-CM | POA: Diagnosis not present

## 2019-12-08 DIAGNOSIS — Z825 Family history of asthma and other chronic lower respiratory diseases: Secondary | ICD-10-CM | POA: Diagnosis not present

## 2019-12-08 DIAGNOSIS — K660 Peritoneal adhesions (postprocedural) (postinfection): Secondary | ICD-10-CM | POA: Diagnosis present

## 2019-12-08 DIAGNOSIS — C189 Malignant neoplasm of colon, unspecified: Secondary | ICD-10-CM | POA: Diagnosis present

## 2019-12-08 DIAGNOSIS — K219 Gastro-esophageal reflux disease without esophagitis: Secondary | ICD-10-CM | POA: Diagnosis present

## 2019-12-08 DIAGNOSIS — D72829 Elevated white blood cell count, unspecified: Secondary | ICD-10-CM

## 2019-12-08 DIAGNOSIS — J449 Chronic obstructive pulmonary disease, unspecified: Secondary | ICD-10-CM | POA: Diagnosis present

## 2019-12-08 DIAGNOSIS — G8929 Other chronic pain: Secondary | ICD-10-CM | POA: Diagnosis present

## 2019-12-08 DIAGNOSIS — Z9981 Dependence on supplemental oxygen: Secondary | ICD-10-CM | POA: Diagnosis not present

## 2019-12-08 DIAGNOSIS — Z09 Encounter for follow-up examination after completed treatment for conditions other than malignant neoplasm: Secondary | ICD-10-CM

## 2019-12-08 DIAGNOSIS — Z833 Family history of diabetes mellitus: Secondary | ICD-10-CM

## 2019-12-08 DIAGNOSIS — Z853 Personal history of malignant neoplasm of breast: Secondary | ICD-10-CM

## 2019-12-08 DIAGNOSIS — M5136 Other intervertebral disc degeneration, lumbar region: Secondary | ICD-10-CM | POA: Diagnosis present

## 2019-12-08 DIAGNOSIS — C786 Secondary malignant neoplasm of retroperitoneum and peritoneum: Secondary | ICD-10-CM | POA: Diagnosis present

## 2019-12-08 DIAGNOSIS — R7989 Other specified abnormal findings of blood chemistry: Secondary | ICD-10-CM | POA: Diagnosis present

## 2019-12-08 DIAGNOSIS — Z923 Personal history of irradiation: Secondary | ICD-10-CM

## 2019-12-08 DIAGNOSIS — Z87891 Personal history of nicotine dependence: Secondary | ICD-10-CM

## 2019-12-08 DIAGNOSIS — D75839 Thrombocytosis, unspecified: Secondary | ICD-10-CM

## 2019-12-08 DIAGNOSIS — Z7983 Long term (current) use of bisphosphonates: Secondary | ICD-10-CM | POA: Diagnosis not present

## 2019-12-08 DIAGNOSIS — I4891 Unspecified atrial fibrillation: Secondary | ICD-10-CM | POA: Diagnosis present

## 2019-12-08 DIAGNOSIS — Z20822 Contact with and (suspected) exposure to covid-19: Secondary | ICD-10-CM | POA: Diagnosis present

## 2019-12-08 DIAGNOSIS — C184 Malignant neoplasm of transverse colon: Secondary | ICD-10-CM | POA: Diagnosis present

## 2019-12-08 LAB — CBC
HCT: 37.4 % (ref 36.0–46.0)
Hemoglobin: 11.7 g/dL — ABNORMAL LOW (ref 12.0–15.0)
MCH: 26.7 pg (ref 26.0–34.0)
MCHC: 31.3 g/dL (ref 30.0–36.0)
MCV: 85.4 fL (ref 80.0–100.0)
Platelets: 618 10*3/uL — ABNORMAL HIGH (ref 150–400)
RBC: 4.38 MIL/uL (ref 3.87–5.11)
RDW: 16.2 % — ABNORMAL HIGH (ref 11.5–15.5)
WBC: 14.9 10*3/uL — ABNORMAL HIGH (ref 4.0–10.5)
nRBC: 0.1 % (ref 0.0–0.2)

## 2019-12-08 LAB — TROPONIN I (HIGH SENSITIVITY)
Troponin I (High Sensitivity): 10 ng/L (ref <18)
Troponin I (High Sensitivity): 10 ng/L (ref <18)
Troponin I (High Sensitivity): 12 ng/L (ref <18)

## 2019-12-08 LAB — COMPREHENSIVE METABOLIC PANEL
ALT: 9 U/L (ref 0–44)
AST: 17 U/L (ref 15–41)
Albumin: 3.3 g/dL — ABNORMAL LOW (ref 3.5–5.0)
Alkaline Phosphatase: 73 U/L (ref 38–126)
Anion gap: 10 (ref 5–15)
BUN: 14 mg/dL (ref 8–23)
CO2: 25 mmol/L (ref 22–32)
Calcium: 8.8 mg/dL — ABNORMAL LOW (ref 8.9–10.3)
Chloride: 101 mmol/L (ref 98–111)
Creatinine, Ser: 0.62 mg/dL (ref 0.44–1.00)
GFR calc Af Amer: >60 mL/min (ref >60)
GFR calc non Af Amer: >60 mL/min (ref >60)
Glucose, Bld: 137 mg/dL — ABNORMAL HIGH (ref 70–99)
Potassium: 4.2 mmol/L (ref 3.5–5.1)
Sodium: 136 mmol/L (ref 135–145)
Total Bilirubin: 0.6 mg/dL (ref 0.3–1.2)
Total Protein: 7.9 g/dL (ref 6.5–8.1)

## 2019-12-08 LAB — TSH: TSH: 2.217 u[IU]/mL (ref 0.350–4.500)

## 2019-12-08 LAB — FIBRIN DERIVATIVES D-DIMER (ARMC ONLY): Fibrin derivatives D-dimer (ARMC): 1907.8 ng/mL (FEU) — ABNORMAL HIGH (ref 0.00–499.00)

## 2019-12-08 LAB — BRAIN NATRIURETIC PEPTIDE: B Natriuretic Peptide: 692 pg/mL — ABNORMAL HIGH (ref 0.0–100.0)

## 2019-12-08 LAB — APTT: aPTT: 34 seconds (ref 24–36)

## 2019-12-08 LAB — CYTOLOGY - NON PAP

## 2019-12-08 LAB — PROTIME-INR
INR: 1 (ref 0.8–1.2)
Prothrombin Time: 13.2 seconds (ref 11.4–15.2)

## 2019-12-08 LAB — MAGNESIUM: Magnesium: 2 mg/dL (ref 1.7–2.4)

## 2019-12-08 LAB — SURGICAL PATHOLOGY

## 2019-12-08 MED ORDER — CALCIUM CARBONATE-VITAMIN D 500-200 MG-UNIT PO TABS
1.0000 | ORAL_TABLET | Freq: Every day | ORAL | Status: DC
  Administered 2019-12-09 – 2019-12-10 (×2): 1 via ORAL
  Filled 2019-12-08 (×2): qty 1

## 2019-12-08 MED ORDER — DOCUSATE SODIUM 100 MG PO CAPS
100.0000 mg | ORAL_CAPSULE | Freq: Every day | ORAL | Status: DC
  Administered 2019-12-09 – 2019-12-10 (×2): 100 mg via ORAL
  Filled 2019-12-08 (×2): qty 1

## 2019-12-08 MED ORDER — OXYCODONE HCL 5 MG PO CAPS: 10.0000 mg | ORAL_CAPSULE | ORAL | 0 refills | Status: DC | PRN

## 2019-12-08 MED ORDER — HEPARIN BOLUS VIA INFUSION
3450.0000 [IU] | Freq: Once | INTRAVENOUS | Status: AC
  Administered 2019-12-08: 3450 [IU] via INTRAVENOUS
  Filled 2019-12-08: qty 3450

## 2019-12-08 MED ORDER — MORPHINE SULFATE (PF) 4 MG/ML IV SOLN
4.0000 mg | Freq: Once | INTRAVENOUS | Status: AC
  Administered 2019-12-08: 18:00:00 4 mg via INTRAVENOUS
  Filled 2019-12-08: qty 1

## 2019-12-08 MED ORDER — HYDROCODONE-ACETAMINOPHEN 5-325 MG PO TABS
1.0000 | ORAL_TABLET | Freq: Four times a day (QID) | ORAL | Status: DC | PRN
  Administered 2019-12-08 – 2019-12-09 (×2): 2 via ORAL
  Filled 2019-12-08 (×2): qty 2

## 2019-12-08 MED ORDER — MOMETASONE FURO-FORMOTEROL FUM 100-5 MCG/ACT IN AERO
2.0000 | INHALATION_SPRAY | Freq: Two times a day (BID) | RESPIRATORY_TRACT | Status: DC
  Administered 2019-12-08 – 2019-12-10 (×4): 2 via RESPIRATORY_TRACT
  Filled 2019-12-08: qty 8.8

## 2019-12-08 MED ORDER — GABAPENTIN 400 MG PO CAPS
400.0000 mg | ORAL_CAPSULE | Freq: Three times a day (TID) | ORAL | Status: DC
  Administered 2019-12-09 – 2019-12-10 (×5): 400 mg via ORAL
  Filled 2019-12-08 (×5): qty 1

## 2019-12-08 MED ORDER — ONDANSETRON HCL 4 MG/2ML IJ SOLN
4.0000 mg | Freq: Once | INTRAMUSCULAR | Status: AC
  Administered 2019-12-08: 4 mg via INTRAVENOUS
  Filled 2019-12-08: qty 2

## 2019-12-08 MED ORDER — IOHEXOL 350 MG/ML SOLN: 75.0000 mL | Freq: Once | INTRAVENOUS | Status: DC | PRN

## 2019-12-08 MED ORDER — DILTIAZEM HCL 25 MG/5ML IV SOLN
15.0000 mg | Freq: Once | INTRAVENOUS | Status: AC
  Administered 2019-12-08: 17:00:00 15 mg via INTRAVENOUS
  Filled 2019-12-08 (×2): qty 5

## 2019-12-08 MED ORDER — MONTELUKAST SODIUM 10 MG PO TABS
10.0000 mg | ORAL_TABLET | Freq: Every day | ORAL | Status: DC
  Administered 2019-12-09 – 2019-12-10 (×2): 10 mg via ORAL
  Filled 2019-12-08 (×3): qty 1

## 2019-12-08 MED ORDER — VITAMIN D 25 MCG (1000 UNIT) PO TABS
2000.0000 [IU] | ORAL_TABLET | Freq: Every day | ORAL | Status: DC
  Administered 2019-12-09 – 2019-12-10 (×2): 2000 [IU] via ORAL
  Filled 2019-12-08 (×2): qty 2

## 2019-12-08 MED ORDER — OXYCODONE HCL 5 MG PO TABS
10.0000 mg | ORAL_TABLET | ORAL | Status: DC | PRN
  Administered 2019-12-08 – 2019-12-09 (×4): 10 mg via ORAL
  Filled 2019-12-08 (×4): qty 2

## 2019-12-08 MED ORDER — HEPARIN (PORCINE) 25000 UT/250ML-% IV SOLN
1050.0000 [IU]/h | INTRAVENOUS | Status: DC
  Administered 2019-12-08: 950 [IU]/h via INTRAVENOUS
  Filled 2019-12-08: qty 250

## 2019-12-08 MED ORDER — PANTOPRAZOLE SODIUM 40 MG PO TBEC
40.0000 mg | DELAYED_RELEASE_TABLET | Freq: Every day | ORAL | Status: DC
  Administered 2019-12-09 – 2019-12-10 (×2): 40 mg via ORAL
  Filled 2019-12-08 (×2): qty 1

## 2019-12-08 MED ORDER — GABAPENTIN 600 MG PO TABS: 600.0000 mg | ORAL_TABLET | Freq: Three times a day (TID) | ORAL | 0 refills | Status: DC

## 2019-12-08 MED ORDER — PRAVASTATIN SODIUM 40 MG PO TABS
40.0000 mg | ORAL_TABLET | Freq: Every day | ORAL | Status: DC
  Administered 2019-12-09 – 2019-12-10 (×2): 40 mg via ORAL
  Filled 2019-12-08 (×2): qty 1

## 2019-12-08 MED ORDER — DILTIAZEM HCL-DEXTROSE 125-5 MG/125ML-% IV SOLN (PREMIX)
5.0000 mg/h | INTRAVENOUS | Status: DC
  Administered 2019-12-08: 5 mg/h via INTRAVENOUS
  Filled 2019-12-08: qty 125

## 2019-12-08 NOTE — Progress Notes (Signed)
Pt arrived on unit here at 2221 and was received to room 249 from ED.  Pt assisted to bsc and bed.  Pt oriented to room and call bell.  Pt still c/o 8/10 abd pain and burning at lap sites.  Sites are intact.  Oxycodone given for pain.  Cardizem gtt infusing at 10.  Heparin infusing at 9.5.   Afib 70 on the monitor.

## 2019-12-08 NOTE — ED Notes (Signed)
Pt states her pain is 6/10, lower abdominal left-sided post-surgical pain and headache

## 2019-12-08 NOTE — Telephone Encounter (Signed)
Patient called and stated that she had surgery yesterday by Dr Dahlia Byes, port placed and laparoscopy diagnostic, and she is in a lot of pain in her stomach area she described it as unbearable. It hurts so bad when she gets up and walks, she stated that the port area is not as bad as her stomach area. She is taking the hydrocodone but its not helping. Please call and advise

## 2019-12-08 NOTE — ED Notes (Addendum)
Admitting dr at bedside.  

## 2019-12-08 NOTE — Progress Notes (Signed)
Elink sepsis monitoring stopped as Code Sepsis cancelled.

## 2019-12-08 NOTE — Progress Notes (Signed)
POD # 1 S/p dx laparoscopy findings of wide spread peritoneal metastasis. Severe abd pain CT scan personally reviewed no concerning findings for perforated viscus Routine post surgical changes She has chronic pain and is on chronic lortab 7.5mg  Taking PO, no nausea or vomiting  PE NAD Alert HR 140-160 irregular Abd: soft, tender to palpation w/o peritonitis  A/P  Abd pain likely a combination of not being narcotic naive and peritoneal biopsy We will prescribe oxycodone 10 in addition I am concerned for A fib w RVR SHe need to go to the ER for further cardiac w/u D/w the pt and the family in detail and they will take her to the ER

## 2019-12-08 NOTE — H&P (Addendum)
History and Physical    Kimberly [name] [id number] DOB: [birth year] DOA: 12/08/2019  PCP: Birdie Sons, MD  Patient coming from: Surgical clinics   I have personally briefly reviewed patient's old medical records in [place]  Chief Complaint: Atrial fibrillation with RVR  HPI: Kimberly [name] is a 74 y.o. female with medical history significant for Colon cancer s/p transverse colectomy, colostomy reversal now with recurrent metastatic adenocarcinoma, hx of breast cancer s/p lumpectomy and radiation treatment, COPD on 2L nocturnal O2, and CAD who presents with concerns of atrial fibrillation with RVR.  She underwent surgery for laparoscopic evaluation of peritoneum and placement of port-a-cath yesterday and was seen at the surgeon office today.  She was noted to have irregular heart rate of 140-160 and was sent to ER for further evaluation. Wide spread peritoneal metastasis was seen in laparoscopy.  Patient reports that she only went to the surgical office today due to burning left lower quadrant pain but denies any chest pain, palpitations or shortness of breath.  No nausea or vomiting.  She has been able to have flatulence since surgery.  Denies any new no lower extremity edema or calf tenderness.  She endorsed previous tobacco use but quit 2 years ago.  Occasional alcohol use and denies any illicit drug use.  Denies any family history of cardiac issues.  ED Course:  She was afebrile and was normotensive with heart rate up to 150s.  Initially diltiazem bolus was held since patient spontaneously converted back to normal sinus rhythm however prior to discharge from ED she again converted back to atrial fibrillation with RVR and was started on diltiazem drip and hospitalist was asked to admit.  She was also given 4 mg of morphine and 4 mg Zofran.  CBC shows leukocytosis of 14.9K, platelet of 618.  Potassium of 4.2, glucose of 137, normal creatinine of 0.62.  Troponin of 10, BNP of  692.  Review of Systems:  Constitutional: No Weight Change, No Fever ENT/Mouth: No sore throat, No Rhinorrhea Eyes: No Eye Pain, No Vision Changes Cardiovascular: No Chest Pain, no SOB,  No Edema, No Palpitations Respiratory:+ Cough, No Sputum Gastrointestinal: No Nausea, No Vomiting, No Diarrhea, No Constipation, + Pain Genitourinary: no Urinary Incontinence,  Musculoskeletal: No Arthralgias, No Myalgias Skin: No Skin Lesions, No Pruritus, Neuro: no Weakness, No Numbness Psych: No Anxiety/Panic, No Depression, no decrease appetite Heme/Lymph: No Bruising, No Bleeding  Past Medical History:  Diagnosis Date  . Breast cancer, left ([place]) 2004   Lumpectomy and rad tx's.  . Chronic back pain   . Colon cancer ([place]) 2018  . COPD (chronic obstructive pulmonary disease) ([place])   . Degenerative disc disease, lumbar 04/2013  . Family history of adverse reaction to anesthesia    son arrested after anesthesia  . GERD (gastroesophageal reflux disease)   . Iron deficiency anemia 05/27/2017  . Personal history of chemotherapy 2018   Colon  . Personal history of radiation therapy    Breast 2004  . Personal history of radiation therapy 2018   Colon  . Postprocedural intraabdominal abscess 09/01/2018  . Small bowel obstruction ([place]) 04/16/2017  . Wears dentures    full upper    Past Surgical History:  Procedure Laterality Date  . APPENDECTOMY  1965  . BREAST EXCISIONAL BIOPSY Left 08/01/2003   lumpectomy rad 11/04-2/28/2005  . BREAST SURGERY    . CESAREAN SECTION     x3  . COLON SURGERY    . COLONOSCOPY W/  POLYPECTOMY    . COLONOSCOPY WITH PROPOFOL N/A 04/09/2018   Procedure: COLONOSCOPY WITH PROPOFOL;  Surgeon: Virgel Manifold, MD;  Location: [facility];  Service: Gastroenterology;  Laterality: N/A;  . COLONOSCOPY WITH PROPOFOL N/A 08/13/2019   Procedure: COLONOSCOPY WITH PROPOFOL;  Surgeon: Virgel Manifold, MD;  Location: [facility];  Service: Endoscopy;   Laterality: N/A;  . COLOSTOMY TAKEDOWN N/A 06/25/2018   Procedure: COLOSTOMY TAKEDOWN;  Surgeon: Jules Husbands, MD;  Location: [facility];  Service: General;  Laterality: N/A;  . ESOPHAGOGASTRODUODENOSCOPY (EGD) WITH PROPOFOL N/A 04/04/2017   Procedure: ESOPHAGOGASTRODUODENOSCOPY (EGD) WITH PROPOFOL;  Surgeon: [name], MD;  Location: [facility];  Service: Endoscopy;  Laterality: N/A;  . FRACTURE SURGERY    . HIP FRACTURE SURGERY Left 01/25/2012  . LAPAROSCOPY N/A 12/07/2019   Procedure: LAPAROSCOPY DIAGNOSTIC;  Surgeon: Jules Husbands, MD;  Location: [facility];  Service: General;  Laterality: N/A;  . LAPAROTOMY N/A 04/15/2017   Procedure: EXPLORATORY LAPAROTOMY;  Surgeon: Clayburn Pert, MD;  Location: [facility];  Service: General;  Laterality: N/A;  . NECK SURGERY  12/2011  . PARASTOMAL HERNIA REPAIR N/A 12/03/2017   Procedure: HERNIA REPAIR PARASTOMAL;  Surgeon: Jules Husbands, MD;  Location: [facility];  Service: General;  Laterality: N/A;  . PARASTOMAL HERNIA REPAIR N/A 06/25/2018   Procedure: HERNIA REPAIR PARASTOMAL;  Surgeon: Jules Husbands, MD;  Location: [facility];  Service: General;  Laterality: N/A;  . PORTACATH PLACEMENT Right 05/21/2017   Procedure: INSERTION PORT-A-CATH;  Surgeon: Clayburn Pert, MD;  Location: [facility];  Service: General;  Laterality: Right;  . PORTACATH PLACEMENT Right 12/07/2019   Procedure: INSERTION PORT-A-CATH;  Surgeon: Jules Husbands, MD;  Location: [facility];  Service: General;  Laterality: Right;  . WRIST FRACTURE SURGERY       reports that she quit smoking about 2 years ago. Her smoking use included cigarettes. She has a 13.75 pack-year smoking history. She has never used smokeless tobacco. She reports current alcohol use. She reports that she does not use drugs.  Allergies  Allergen Reactions  . Betadine [Povidone Iodine] Other (See Comments)    Topical iodine she states "if you put it in an open sore it will cause a eating ulcer."  . Iodine  Other (See Comments)    Patient states "it gives me infections"  . Other Nausea And Vomiting    Antihystamines  . Sinus Formula [Cholestatin] Nausea Only    Family History  Problem Relation Age of Onset  . Diabetes Mother        type 2  . Coronary artery disease Mother   . Deep vein thrombosis Mother   . Colon cancer Father   . Asthma Brother   . Diabetes Brother        type 2  . Breast cancer Neg Hx      Prior to Admission medications   Medication Sig Start Date End Date Taking? Authorizing Provider  budesonide-formoterol (SYMBICORT) 80-4.5 MCG/ACT inhaler Inhale 2 puffs into the lungs 2 (two) times daily. 06/09/18  Yes Laverle Hobby, MD  calcium citrate-vitamin D (CITRACAL+D) 315-200 MG-UNIT tablet Take 1 tablet by mouth daily.   Yes [provider]  Cholecalciferol (VITAMIN D3) 50 MCG (2000 UT) TABS Take 2,000 Units by mouth daily. 11/05/19  Yes Birdie Sons, MD  docusate sodium (COLACE) 100 MG capsule Take 100 mg by mouth daily.   Yes [provider]  gabapentin (NEURONTIN) 400 MG capsule TAKE 1 CAPSULE  THREE TIMES DAILY Patient taking differently: Take 400 mg by mouth 3 (three) times daily.  11/08/19  Yes Earlie Server, MD  HYDROcodone-acetaminophen (NORCO/VICODIN) 5-325 MG tablet Take 1-2 tablets by mouth every 6 (six) hours as needed for moderate pain. 12/07/19  Yes Pabon, [place], MD  lidocaine-prilocaine (EMLA) cream Apply 1 application topically as needed. Apply to port and cover with saran wrap 1.5 hour prior to appointment 12/07/19  Yes Earlie Server, MD  meloxicam (MOBIC) 15 MG tablet TAKE 1 TABLET EVERY DAY AS NEEDED Patient taking differently: Take 15 mg by mouth at bedtime.  05/20/19  Yes Birdie Sons, MD  montelukast (SINGULAIR) 10 MG tablet TAKE 1 TABLET BY MOUTH EVERY DAY Patient taking differently: Take 10 mg by mouth daily.  03/30/19  Yes Laverle Hobby, MD  Multiple Vitamins-Minerals (WOMENS MULTI VITAMIN & MINERAL PO) Take 2 tablets by  mouth daily.    Yes [provider]  omeprazole (PRILOSEC) 20 MG capsule Take 1 capsule (20 mg total) by mouth daily. 03/10/19  Yes Birdie Sons, MD  ondansetron (ZOFRAN) 4 MG tablet TAKE 1/2 TO 1 TABLET EVERY 8 HOURS AS NEEDED FOR NAUSEA OR VOMITING Patient taking differently: Take 2-4 mg by mouth every 8 (eight) hours as needed for nausea or vomiting.  11/08/19  Yes Birdie Sons, MD  oxycodone (OXY-IR) 5 MG capsule Take 2 capsules (10 mg total) by mouth every 4 (four) hours as needed. 12/08/19  Yes Pabon, Diego F, MD  pravastatin (PRAVACHOL) 40 MG tablet TAKE 1 TABLET EVERY DAY Patient taking differently: Take 40 mg by mouth daily.  05/20/19  Yes Birdie Sons, MD  Probiotic Product (PROBIOTIC DAILY PO) Take 1 capsule by mouth daily.    Yes [provider]  Triamcinolone Acetonide (NASACORT ALLERGY 24HR NA) Place 2 sprays into the nose daily as needed (allergies).    Yes [provider]    Physical Exam: Vitals:   12/08/19 1730 12/08/19 1800 12/08/19 1830 12/08/19 1900  BP: 117/70 125/83 119/90 (!) 141/75  Pulse: 99 77 (!) 52 (!) 139  Resp: 14 15 15 15   Temp:      TempSrc:      SpO2: 91% 90% 92% 94%  Weight:      Height:        Constitutional: NAD, calm, comfortable, elderly female laying at 30 degree incline in bed Vitals:   12/08/19 1730 12/08/19 1800 12/08/19 1830 12/08/19 1900  BP: 117/70 125/83 119/90 (!) 141/75  Pulse: 99 77 (!) 52 (!) 139  Resp: 14 15 15 15   Temp:      TempSrc:      SpO2: 91% 90% 92% 94%  Weight:      Height:       Eyes: PERRL, lids and conjunctivae normal ENMT: Mucous membranes are moist.  Neck: normal, supple, right IJ port noted Respiratory: Faint expiratory wheezes anteriorly with no crackles. Normal respiratory effort on 2 L via nasal cannula with 92% oxygen saturation. No accessory muscle use.  Cardiovascular: Fluctuating between irregular irregular rhythm to sinus tachycardia on telemetry with rates up to 150,  no murmurs / rubs / gallops. No extremity edema. 2+ pedal pulses.  Right IJ port noted to right anterior chest without erythema or significant tenderness to palpation. Abdomen: Mild diffuse tenderness, no masses palpated. Bowel sounds positive.  Clean nonerythematous surgical scar to the left lower quadrant. Musculoskeletal: no clubbing / cyanosis. No joint deformity upper and lower extremities. Good ROM, no contractures.  Normal muscle tone.  No bilateral calf tenderness. Skin: no rashes, lesions, ulcers. No induration Neurologic: CN 2-12 grossly intact. Sensation intact. Strength 5/5 in all 4.  Psychiatric: Normal judgment and insight. Alert and oriented x 3. Normal mood.     Labs on Admission: I have personally reviewed following labs and imaging studies  CBC: Recent Labs  Lab 12/08/19 1617  WBC 14.9*  HGB 11.7*  HCT 37.4  MCV 85.4  PLT 0000000*   Basic Metabolic Panel: Recent Labs  Lab 12/08/19 1617  NA 136  K 4.2  CL 101  CO2 25  GLUCOSE 137*  BUN 14  CREATININE 0.62  CALCIUM 8.8*   GFR: Estimated Creatinine Clearance: 62.2 mL/min (by C-G formula based on SCr of 0.62 mg/dL). Liver Function Tests: Recent Labs  Lab 12/08/19 1617  AST 17  ALT 9  ALKPHOS 73  BILITOT 0.6  PROT 7.9  ALBUMIN 3.3*   No results for input(s): LIPASE, AMYLASE in the last 168 hours. No results for input(s): AMMONIA in the last 168 hours. Coagulation Profile: No results for input(s): INR, PROTIME in the last 168 hours. Cardiac Enzymes: No results for input(s): CKTOTAL, CKMB, CKMBINDEX, TROPONINI in the last 168 hours. BNP (last 3 results) No results for input(s): PROBNP in the last 8760 hours. HbA1C: No results for input(s): HGBA1C in the last 72 hours. CBG: No results for input(s): GLUCAP in the last 168 hours. Lipid Profile: No results for input(s): CHOL, HDL, LDLCALC, TRIG, CHOLHDL, LDLDIRECT in the last 72 hours. Thyroid Function Tests: No results for input(s): TSH, T4TOTAL,  FREET4, T3FREE, THYROIDAB in the last 72 hours. Anemia Panel: No results for input(s): VITAMINB12, FOLATE, FERRITIN, TIBC, IRON, RETICCTPCT in the last 72 hours. Urine analysis:    Component Value Date/Time   COLORURINE AMBER (A) 11/29/2019 1340   APPEARANCEUR HAZY (A) 11/29/2019 1340   APPEARANCEUR Hazy 01/26/2012 0108   LABSPEC 1.029 11/29/2019 1340   LABSPEC 1.021 01/26/2012 0108   PHURINE 5.0 11/29/2019 1340   GLUCOSEU NEGATIVE 11/29/2019 1340   GLUCOSEU Negative 01/26/2012 0108   HGBUR NEGATIVE 11/29/2019 1340   BILIRUBINUR SMALL (A) 11/29/2019 1340   BILIRUBINUR Negative 01/26/2012 0108   KETONESUR NEGATIVE 11/29/2019 1340   PROTEINUR 30 (A) 11/29/2019 1340   NITRITE NEGATIVE 11/29/2019 1340   LEUKOCYTESUR NEGATIVE 11/29/2019 1340   LEUKOCYTESUR Negative 01/26/2012 0108    Radiological Exams on Admission: CT ABDOMEN PELVIS WO CONTRAST  Addendum Date: 12/08/2019   ADDENDUM REPORT: 12/08/2019 16:58 ADDENDUM: Upon further review, 11 mm irregular nodular density is seen adjacent to the colonic surgical anastomosis, but may be slightly enlarged compared to prior exam. There is also noted 18 mm nodular density in anterior abdominal wall centrally which may be slightly enlarged compared to prior exam. Etiology of these is unknown, but possible malignancy or metastatic disease cannot be excluded. PET scan is recommended for further evaluation. Bilateral adrenal masses are noted most consistent with adenomas. Mild pneumoperitoneum is noted consistent with recent surgery. Aortic Atherosclerosis (ICD10-I70.0). Electronically Signed   By: Marijo Conception M.D.   On: 12/08/2019 16:58   Result Date: 12/08/2019 CLINICAL DATA:  Acute generalized abdominal pain. EXAM: CT ABDOMEN AND PELVIS WITHOUT CONTRAST TECHNIQUE: Multidetector CT imaging of the abdomen and pelvis was performed following the standard protocol without IV contrast. COMPARISON:  June 21, 2019. FINDINGS: Lower chest: Minimal  right posterior basilar subsegmental atelectasis is noted. Hepatobiliary: No focal liver abnormality is seen. No gallstones, gallbladder wall thickening, or biliary dilatation.  Pancreas: Unremarkable. No pancreatic ductal dilatation or surrounding inflammatory changes. Spleen: Normal in size without focal abnormality. Adrenals/Urinary Tract: Stable bilateral adrenal masses are noted most consistent with adenomas. No hydronephrosis or renal obstruction is noted. No renal or ureteral calculi are noted. Urinary bladder is unremarkable. Stomach/Bowel: Status post right hemicolectomy. The stomach is unremarkable. There is no evidence of bowel obstruction or inflammation. Vascular/Lymphatic: Aortic atherosclerosis. No enlarged abdominal or pelvic lymph nodes. Reproductive: Uterus and bilateral adnexa are unremarkable. Other: Mild pneumoperitoneum is noted consistent with recent surgery. No ascites is noted. Musculoskeletal: No acute or significant osseous findings. Electronically Signed: By: Marijo Conception M.D. On: 12/08/2019 13:14   DG Chest Port 1 View  Result Date: 12/08/2019 CLINICAL DATA:  Left lower quadrant abdominal pain. Surgical procedure yesterday. Recent port placement. EXAM: PORTABLE CHEST 1 VIEW COMPARISON:  CT same day. Chest radiography yesterday. FINDINGS: Power port inserted from a right internal jugular approach. Tip at the SVC RA junction. No pneumothorax. Mild bibasilar atelectasis. Small amount of pneumoperitoneum as shown on the previous CT consistent with yesterday's procedure. IMPRESSION: Mild bibasilar atelectasis. Power port tip at the SVC RA junction. No pneumothorax. Small amount of pneumoperitoneum as shown on the previous CT. Electronically Signed   By: Nelson Chimes M.D.   On: 12/08/2019 16:48   DG CHEST PORT 1 VIEW  Result Date: 12/07/2019 CLINICAL DATA:  Status post Port-A-Cath placement. EXAM: PORTABLE CHEST 1 VIEW COMPARISON:  October 05, 2019. FINDINGS: Stable cardiomediastinal  silhouette. No pneumothorax pleural effusion is noted. Mild bibasilar subsegmental atelectasis or scarring is noted. Interval placement of right internal jugular Port-A-Cath with distal tip in expected position of SVC. Atherosclerosis of thoracic aorta is noted. Bony thorax is unremarkable. IMPRESSION: Interval placement of right internal jugular Port-A-Cath with distal tip in expected position of the SVC. No pneumothorax is noted. Electronically Signed   By: Marijo Conception M.D.   On: 12/07/2019 10:28   DG C-Arm 1-60 Min-No Report  Result Date: 12/07/2019 Fluoroscopy was utilized by the requesting physician.  No radiographic interpretation.    EKG: Independently reviewed.   Assessment/Plan  New onset paroxysmal atrial fibrillation Likely secondary to recent stress from laparoscopic surgery and Port-A-Cath placement.  However she also has high risk for pulmonary embolism due to her metastatic cancer and recent surgery.  Although other than tachycardia she is not hypoxic and does not have findings of DVT. D-dimer elevated but has cancer. Pt refused iodine contrast due to reaction in the past. Will obtain V/Q scan Check TSH, Mg Obtain echocardiogram CHA2DS2-VASc score of 3- pt discussed with Dr. Rogue Bussing with heme/onc regarding anticoagulation and she is okay to start from their standpoint. Discussed with general surgery Dr. Hampton Abbot as well since she is POD1 of surgery. Okay to start heparin gtt and monitor closely for any active bleeds or drop in hemoglobin.  Leukocytosis/thrombocytosis Suspect could be reactive to recent stress from surgery.  Continue to closely monitor  Chronic iron deficiency anemia in the setting of metastatic colon cancer Has plans for IV iron infusion with heme-onc Continue to monitor her hemoglobin closely with anticoagulation  Metastatic colon cancer s/p diagnostic peritoneal laparoscopy and right IJ port placement (12/07/19) POD1 Follows with heme/onc Wound care  per RN continue colace and PPI continue PRN oxycodone every 4 hrs and Norco q6hr   COPD with chronic nocturnal hypoxemia Continue nighttime 2 L via nasal cannula Continue bronchodilator Continue Singulair  DVT prophylaxis:Heparin gtt Code Status: Full Family Communication: Plan discussed with  patient at bedside and with daughter over the phone disposition Plan: Home with at least 2 midnight stays  Consults called:  Admission status: inpatient with at least 2 midnight stay given the need for close telemetry monitoring of irregular heart rhythm  Royal Piedra T Dennis Killilea DO [facility]   If 7PM-7AM, please contact night-coverage [email or web address]   12/08/2019, 7:22 PM

## 2019-12-08 NOTE — Telephone Encounter (Signed)
Per Dr.Pabon would like for patient to come in for an appointment and have a STAT CT Abdomen Pelvis w Contrast. Patient is scheduled for 12/08/19 at 2:45p. Patient was instructed to head over to [facility] to have scan done STAT and notify she will come to office our for her appointment at 2:45pm. Patient verbalizes understanding.

## 2019-12-08 NOTE — Telephone Encounter (Signed)
-----   Message from Earlie Server, MD sent at 12/07/2019  9:07 PM EST ----- She had laparoscopy evaluation today and please schedule her to see me next week for discussion of biopsy results. Thanks. She has venofer infusion too. Can come on the same day or different day. No labs. Just MD. Thanks.

## 2019-12-08 NOTE — Patient Instructions (Addendum)
Pick up your prescription at your local pharmacy.   You are to go to the ER at this time due to having Afib.

## 2019-12-08 NOTE — ED Provider Notes (Signed)
[facility] Emergency Department Provider Note   ____________________________________________    I have reviewed the triage vital signs and the nursing notes.   HISTORY  Chief Complaint Tachycardia     HPI Kimberly [name] is a 74 y.o. female with extensive past medical history as noted below who was at her surgeons office today being evaluated and found to have elevated heart rate and concern for atrial fibrillation.  The patient denies chest pain or shortness of breath.  Does not feel any palpitations.  No nausea or vomiting.  Reports chronic abdominal pain is unchanged.  No fevers noted.  Has never had this before.  No history of arrhythmias.  Past Medical History:  Diagnosis Date  . Breast cancer, left ([place]) 2004   Lumpectomy and rad tx's.  . Chronic back pain   . Colon cancer ([place]) 2018  . COPD (chronic obstructive pulmonary disease) ([place])   . Degenerative disc disease, lumbar 04/2013  . Family history of adverse reaction to anesthesia    son arrested after anesthesia  . GERD (gastroesophageal reflux disease)   . Iron deficiency anemia 05/27/2017  . Personal history of chemotherapy 2018   Colon  . Personal history of radiation therapy    Breast 2004  . Personal history of radiation therapy 2018   Colon  . Postprocedural intraabdominal abscess 09/01/2018  . Small bowel obstruction ([place]) 04/16/2017  . Wears dentures    full upper    Patient Active Problem List   Diagnosis Date Noted  . Goals of care, counseling/discussion 11/30/2019  . Personal history of colon cancer   . Intestinal bypass or anastomosis status   . Colonic edema   . Dependence on nocturnal oxygen therapy 08/12/2018  . Clostridium difficile colitis 07/31/2018  . S/P colostomy takedown 06/25/2018  . Parastomal hernia without obstruction or gangrene   . Status post colon resection   . Colostomy status ([place])   . Colostomy prolapse ([place])   . Iron deficiency anemia  05/27/2017  . Port-A-Cath in place   . Left peroneal vein thrombosis ([place])   . Malignant neoplasm of transverse colon ([place]) 04/26/2017  . Generalized abdominal pain   . Loss of weight   . Gastritis without bleeding   . Absence of bladder continence 04/01/2017  . Asthmatic breathing 04/01/2017  . OP (osteoporosis) 02/11/2017  . Skin lesion 02/11/2017  . Breast swelling 02/11/2017  . Narrowing of intervertebral disc space 02/11/2017  . Family history of colon cancer 02/11/2017  . Foot pain 02/11/2017  . Arthralgia of hip 02/11/2017  . Personal history of malignant neoplasm of breast 02/11/2017  . Eczema intertrigo 02/11/2017  . Neuropathy 02/11/2017  . Osteoarthrosis, unspecified whether generalized or localized, involving lower leg 02/11/2017  . Coronary atherosclerosis 01/29/2017  . Smoking greater than 30 pack years 01/10/2017  . Vitamin D deficiency 08/21/2016  . Hyperlipidemia 11/10/2015  . Chronic back pain 05/26/2015  . DDD (degenerative disc disease), lumbosacral 05/26/2015  . History of colon polyps 04/27/2007    Past Surgical History:  Procedure Laterality Date  . APPENDECTOMY  1965  . BREAST EXCISIONAL BIOPSY Left 08/01/2003   lumpectomy rad 11/04-2/28/2005  . BREAST SURGERY    . CESAREAN SECTION     x3  . COLON SURGERY    . COLONOSCOPY W/ POLYPECTOMY    . COLONOSCOPY WITH PROPOFOL N/A 04/09/2018   Procedure: COLONOSCOPY WITH PROPOFOL;  Surgeon: Virgel Manifold, MD;  Location: [facility];  Service: Gastroenterology;  Laterality:  N/A;  . COLONOSCOPY WITH PROPOFOL N/A 08/13/2019   Procedure: COLONOSCOPY WITH PROPOFOL;  Surgeon: Virgel Manifold, MD;  Location: [facility];  Service: Endoscopy;  Laterality: N/A;  . COLOSTOMY TAKEDOWN N/A 06/25/2018   Procedure: COLOSTOMY TAKEDOWN;  Surgeon: Jules Husbands, MD;  Location: [facility];  Service: General;  Laterality: N/A;  . ESOPHAGOGASTRODUODENOSCOPY (EGD) WITH PROPOFOL N/A 04/04/2017   Procedure:  ESOPHAGOGASTRODUODENOSCOPY (EGD) WITH PROPOFOL;  Surgeon: [name], MD;  Location: [facility];  Service: Endoscopy;  Laterality: N/A;  . FRACTURE SURGERY    . HIP FRACTURE SURGERY Left 01/25/2012  . LAPAROSCOPY N/A 12/07/2019   Procedure: LAPAROSCOPY DIAGNOSTIC;  Surgeon: Jules Husbands, MD;  Location: [facility];  Service: General;  Laterality: N/A;  . LAPAROTOMY N/A 04/15/2017   Procedure: EXPLORATORY LAPAROTOMY;  Surgeon: Clayburn Pert, MD;  Location: [facility];  Service: General;  Laterality: N/A;  . NECK SURGERY  12/2011  . PARASTOMAL HERNIA REPAIR N/A 12/03/2017   Procedure: HERNIA REPAIR PARASTOMAL;  Surgeon: Jules Husbands, MD;  Location: [facility];  Service: General;  Laterality: N/A;  . PARASTOMAL HERNIA REPAIR N/A 06/25/2018   Procedure: HERNIA REPAIR PARASTOMAL;  Surgeon: Jules Husbands, MD;  Location: [facility];  Service: General;  Laterality: N/A;  . PORTACATH PLACEMENT Right 05/21/2017   Procedure: INSERTION PORT-A-CATH;  Surgeon: Clayburn Pert, MD;  Location: [facility];  Service: General;  Laterality: Right;  . PORTACATH PLACEMENT Right 12/07/2019   Procedure: INSERTION PORT-A-CATH;  Surgeon: Jules Husbands, MD;  Location: [facility];  Service: General;  Laterality: Right;  . WRIST FRACTURE SURGERY      Prior to Admission medications   Medication Sig Start Date End Date Taking? Authorizing Provider  alendronate (FOSAMAX) 70 MG tablet Take 1 tablet (70 mg total) by mouth every 7 (seven) days. Take with a full glass of water on an empty stomach. 10/06/19   Birdie Sons, MD  budesonide-formoterol (SYMBICORT) 80-4.5 MCG/ACT inhaler Inhale 2 puffs into the lungs 2 (two) times daily. 06/09/18   Laverle Hobby, MD  calcium citrate-vitamin D (CITRACAL+D) 315-200 MG-UNIT tablet Take 1 tablet by mouth daily.    [provider]  Cholecalciferol (VITAMIN D3) 50 MCG (2000 UT) TABS Take 2,000 Units by mouth daily. 11/05/19   Birdie Sons, MD  docusate sodium (COLACE)  100 MG capsule Take 100 mg by mouth daily.    [provider]  gabapentin (NEURONTIN) 400 MG capsule TAKE 1 CAPSULE THREE TIMES DAILY Patient taking differently: Take 400 mg by mouth 3 (three) times daily.  11/08/19   Earlie Server, MD  gabapentin (NEURONTIN) 600 MG tablet Take 1 tablet (600 mg total) by mouth 3 (three) times daily. 12/08/19   Pabon, Diego F, MD  HYDROcodone-acetaminophen (NORCO/VICODIN) 5-325 MG tablet Take 1-2 tablets by mouth every 6 (six) hours as needed for moderate pain. 12/07/19   Pabon, Diego F, MD  lidocaine-prilocaine (EMLA) cream Apply 1 application topically as needed. Apply to port and cover with saran wrap 1.5 hour prior to appointment 12/07/19   Earlie Server, MD  meloxicam (MOBIC) 15 MG tablet TAKE 1 TABLET EVERY DAY AS NEEDED Patient taking differently: Take 15 mg by mouth daily as needed for pain.  05/20/19   Birdie Sons, MD  montelukast (SINGULAIR) 10 MG tablet TAKE 1 TABLET BY MOUTH EVERY DAY Patient taking differently: Take 10 mg by mouth daily.  03/30/19   Laverle Hobby, MD  Multiple Vitamins-Minerals (WOMENS MULTI VITAMIN & MINERAL PO)  Take 2 tablets by mouth daily.     [provider]  omeprazole (PRILOSEC) 20 MG capsule Take 1 capsule (20 mg total) by mouth daily. 03/10/19   Birdie Sons, MD  ondansetron (ZOFRAN) 4 MG tablet TAKE 1/2 TO 1 TABLET EVERY 8 HOURS AS NEEDED FOR NAUSEA OR VOMITING Patient taking differently: Take 2-4 mg by mouth every 8 (eight) hours as needed for nausea or vomiting.  11/08/19   Birdie Sons, MD  oxycodone (OXY-IR) 5 MG capsule Take 2 capsules (10 mg total) by mouth every 4 (four) hours as needed. 12/08/19   Pabon, Akins, MD  pravastatin (PRAVACHOL) 40 MG tablet TAKE 1 TABLET EVERY DAY Patient taking differently: Take 40 mg by mouth daily.  05/20/19   Birdie Sons, MD  Probiotic Product (PROBIOTIC DAILY PO) Take 1 capsule by mouth daily.     [provider]  Triamcinolone Acetonide (NASACORT ALLERGY  24HR NA) Place 2 sprays into the nose daily as needed (allergies).     [provider]     Allergies Betadine [povidone iodine], Iodine, Other, and Sinus formula [cholestatin]  Family History  Problem Relation Age of Onset  . Diabetes Mother        type 2  . Coronary artery disease Mother   . Deep vein thrombosis Mother   . Colon cancer Father   . Asthma Brother   . Diabetes Brother        type 2  . Breast cancer Neg Hx     Social History Social History   Tobacco Use  . Smoking status: Former Smoker    Packs/day: 0.25    Years: 55.00    Pack years: 13.75    Types: Cigarettes    Quit date: 05/21/2017    Years since quitting: 2.5  . Smokeless tobacco: Never Used  . Tobacco comment: started age 38 1/2 to 1 ppd  Substance Use Topics  . Alcohol use: Yes    Alcohol/week: 0.0 standard drinks    Comment: occasionally drinks beer  . Drug use: No    Review of Systems  Constitutional: No fever/chills Eyes: No visual changes.  ENT: No sore throat. Cardiovascular: As above Respiratory: As above Gastrointestinal: Positive abdominal pain no nausea, no vomiting.   Genitourinary: Negative for dysuria. Musculoskeletal: Negative for back pain. Skin: Negative for rash. Neurological: Negative for headaches   ____________________________________________   PHYSICAL EXAM:  VITAL SIGNS: ED Triage Vitals  Enc Vitals Group     BP 12/08/19 1601 122/83     Pulse Rate 12/08/19 1601 (!) 156     Resp 12/08/19 1601 18     Temp 12/08/19 1601 98.3 F (36.8 C)     Temp Source 12/08/19 1601 Oral     SpO2 12/08/19 1601 (!) 89 %     Weight 12/08/19 1602 73 kg (161 lb)     Height 12/08/19 1602 1.626 m (5\' 4" )     Head Circumference --      Peak Flow --      Pain Score 12/08/19 1602 8     Pain Loc --      Pain Edu? --      Excl. in [place]? --     Constitutional: Alert and oriented.   Nose: No congestion/rhinnorhea. Mouth/Throat: Mucous membranes are moist.   Neck:   Painless ROM Cardiovascular: Tachycardia, irregular rhythm. Grossly normal heart sounds.  Good peripheral circulation. Respiratory: Normal respiratory effort.  No retractions Gastrointestinal: Diffuse abdominal  tenderness. No distention.    Musculoskeletal: No lower extremity tenderness nor edema.  Warm and well perfused Neurologic:  Normal speech and language. No gross focal neurologic deficits are appreciated.  Skin:  Skin is warm, dry and intact. No rash noted. Psychiatric: Mood and affect are normal. Speech and behavior are normal.  ____________________________________________   LABS (all labs ordered are listed, but only abnormal results are displayed)  Labs Reviewed  CBC - Abnormal; Notable for the following components:      Result Value   WBC 14.9 (*)    Hemoglobin 11.7 (*)    RDW 16.2 (*)    Platelets 618 (*)    All other components within normal limits  COMPREHENSIVE METABOLIC PANEL - Abnormal; Notable for the following components:   Glucose, Bld 137 (*)    Calcium 8.8 (*)    Albumin 3.3 (*)    All other components within normal limits  BRAIN NATRIURETIC PEPTIDE - Abnormal; Notable for the following components:   B Natriuretic Peptide 692.0 (*)    All other components within normal limits  SARS CORONAVIRUS 2 (TAT 6-24 HRS)  TROPONIN I (HIGH SENSITIVITY)   ____________________________________________  EKG  ED ECG REPORT I, Lavonia Drafts, the attending physician, personally viewed and interpreted this ECG.  Date: 12/08/2019  Rhythm: Atrial fibrillation with RVR QRS Axis: normal Intervals: normal ST/T Wave abnormalities: Abnormal Narrative Interpretation: Atrial fibrillation with RVR  ED ECG REPORT I, Lavonia Drafts, the attending physician, personally viewed and interpreted this ECG.  Date: 12/08/2019  Rhythm: normal sinus rhythm QRS Axis: normal Intervals: normal ST/T Wave abnormalities: Nonspecific changes Narrative Interpretation: no evidence of  acute ischemia    ____________________________________________  RADIOLOGY  Chest x-ray viewed by me, unremarkable ____________________________________________   PROCEDURES  Procedure(s) performed: No  Procedures   Critical Care performed: yes  CRITICAL CARE Performed by: Lavonia Drafts   Total critical care time: 30 minutes  Critical care time was exclusive of separately billable procedures and treating other patients.  Critical care was necessary to treat or prevent imminent or life-threatening deterioration.  Critical care was time spent personally by me on the following activities: development of treatment plan with patient and/or surrogate as well as nursing, discussions with consultants, evaluation of patient's response to treatment, examination of patient, obtaining history from patient or surrogate, ordering and performing treatments and interventions, ordering and review of laboratory studies, ordering and review of radiographic studies, pulse oximetry and re-evaluation of patient's condition.  ____________________________________________   INITIAL IMPRESSION / ASSESSMENT AND PLAN / ED COURSE  Pertinent labs & imaging results that were available during my care of the patient were reviewed by me and considered in my medical decision making (see chart for details).  Patient presents with new onset atrial fibrillation with RVR.  Overall well-appearing in no acute distress.  Ordered Cardizem 15 mg IV, the nurse was about to give it when the patient spontaneously converted back to normal sinus rhythm.  Labs pending, will monitor in the emergency department and if she stays in normal sinus rhythm will be appropriate for discharge.  ----------------------------------------- 5:23 PM on 12/08/2019 -----------------------------------------  Patient has gone back into atrial fibrillation with RVR, will give 15 mg IV Cardizem.  Patient improved briefly with Cardizem  however rate has increased again, will start Cardizem drip and discussed with hospitalist for admission.    ____________________________________________   FINAL CLINICAL IMPRESSION(S) / ED DIAGNOSES  Final diagnoses:  Atrial fibrillation with RVR ([place])  Note:  This document was prepared using Dragon voice recognition software and may include unintentional dictation errors.   Lavonia Drafts, MD 12/08/19 1827

## 2019-12-08 NOTE — Plan of Care (Signed)

## 2019-12-08 NOTE — ED Triage Notes (Addendum)
Pt went to doctor today for LLQ abdominal pain after procedure yesterday per pt to look at colon.  Pt also had port placed yesterday.  Port area appears slightly swollen and is very tender so placing PIV at this time.  Pt sent from doctor today for HR in 150s. Pt denies hx of any arrhythmias.  Denies chest pain at this time.  sats 89-90 RA. Pt is copd pt and smoker.  Home o2 only at night. No redness or drainage from port site.

## 2019-12-08 NOTE — Consult Note (Signed)
Kimberly [name] for heparin  Indication: atrial fibrillation  Allergies  Allergen Reactions  . Betadine [Povidone Iodine] Other (See Comments)    Topical iodine she states "if you put it in an open sore it will cause a eating ulcer."  . Iodine Other (See Comments)    Patient states "it gives me infections"  . Other Nausea And Vomiting    Antihystamines  . Sinus Formula [Cholestatin] Nausea Only    Patient Measurements: Height: 5\' 4"  (162.6 cm) Weight: 161 lb (73 kg) IBW/kg (Calculated) : 54.7 Heparin Dosing Weight: 69.8 kg   Vital Signs: Temp: 98.3 F (36.8 C) (03/10 1601) Temp Source: Oral (03/10 1601) BP: 129/79 (03/10 2009) Pulse Rate: 129 (03/10 2009)  Labs: Recent Labs    12/08/19 1617  HGB 11.7*  HCT 37.4  PLT 618*  CREATININE 0.62  TROPONINIHS 10    Estimated Creatinine Clearance: 62.2 mL/min (by C-G formula based on SCr of 0.62 mg/dL).   Medications:  No PTA anticoagulant (confirmed with patient)  Assessment: Pharmacy consulted for heparin dosing in a patient with atrial fibrillation. Will order BL INR and aPTT. CBC already ordered; platelet elevated.    Goal of Therapy:  Heparin level 0.3-0.7 units/ml Monitor platelets by anticoagulation protocol: Yes   Plan:  Baseline labs have been ordered  Heparin DW: 69.8 kg Give 3450 units bolus x 1 Start heparin infusion at 950 units/hr Check anti-Xa level in 8 hours and daily while on heparin, per protocol Continue to monitor H&H and platelets   Elhadji Pecore R Janel Beane 12/08/2019,8:15 PM

## 2019-12-08 NOTE — ED Notes (Signed)
Patient HR noted to be in 80s NSR. Patient converted without intervention. MD notified. EKG obtained.

## 2019-12-08 NOTE — Telephone Encounter (Signed)
Pts MD appt has been added as requested and pt is aware of the scheduled date and time of her appt.

## 2019-12-08 NOTE — Telephone Encounter (Signed)
Received call from Radiology at [facility]. They state that the patient is refusing to have a CT with contrast dye. She states that she has an allergy to it. Chart is only documenting topical reaction to iodine. No documentation of IVP dye reaction. Also the patient cannot drink the oral contrast due to being a post op patient. New order placed for CT abdomen and pelvis with out contrast. Patient is schedule to follow up today in office.

## 2019-12-09 ENCOUNTER — Inpatient Hospital Stay: Payer: Medicare HMO

## 2019-12-09 ENCOUNTER — Inpatient Hospital Stay
Admit: 2019-12-09 | Discharge: 2019-12-09 | Disposition: A | Discharge status: Home / Self Care | Payer: Medicare HMO | Attending: Family Medicine | Admitting: Family Medicine

## 2019-12-09 DIAGNOSIS — I4891 Unspecified atrial fibrillation: Secondary | ICD-10-CM

## 2019-12-09 LAB — CBC
HCT: 32.7 % — ABNORMAL LOW (ref 36.0–46.0)
Hemoglobin: 9.8 g/dL — ABNORMAL LOW (ref 12.0–15.0)
MCH: 26.3 pg (ref 26.0–34.0)
MCHC: 30 g/dL (ref 30.0–36.0)
MCV: 87.7 fL (ref 80.0–100.0)
Platelets: 488 10*3/uL — ABNORMAL HIGH (ref 150–400)
RBC: 3.73 MIL/uL — ABNORMAL LOW (ref 3.87–5.11)
RDW: 16.4 % — ABNORMAL HIGH (ref 11.5–15.5)
WBC: 9.6 10*3/uL (ref 4.0–10.5)
nRBC: 0 % (ref 0.0–0.2)

## 2019-12-09 LAB — BASIC METABOLIC PANEL
Anion gap: 8 (ref 5–15)
BUN: 13 mg/dL (ref 8–23)
CO2: 30 mmol/L (ref 22–32)
Calcium: 8.3 mg/dL — ABNORMAL LOW (ref 8.9–10.3)
Chloride: 100 mmol/L (ref 98–111)
Creatinine, Ser: 0.57 mg/dL (ref 0.44–1.00)
GFR calc Af Amer: >60 mL/min (ref >60)
GFR calc non Af Amer: >60 mL/min (ref >60)
Glucose, Bld: 99 mg/dL (ref 70–99)
Potassium: 4 mmol/L (ref 3.5–5.1)
Sodium: 138 mmol/L (ref 135–145)

## 2019-12-09 LAB — ECHOCARDIOGRAM COMPLETE
Height: 64 in
Weight: 2540.8 oz

## 2019-12-09 LAB — SARS CORONAVIRUS 2 (TAT 6-24 HRS): SARS Coronavirus 2: NEGATIVE

## 2019-12-09 LAB — HEPARIN LEVEL (UNFRACTIONATED): Heparin Unfractionated: 0.21 IU/mL — ABNORMAL LOW (ref 0.30–0.70)

## 2019-12-09 MED ORDER — TECHNETIUM TO 99M ALBUMIN AGGREGATED
4.0000 | Freq: Once | INTRAVENOUS | Status: AC | PRN
  Administered 2019-12-09: 3.86 via INTRAVENOUS

## 2019-12-09 MED ORDER — METOPROLOL TARTRATE 25 MG PO TABS
25.0000 mg | ORAL_TABLET | Freq: Two times a day (BID) | ORAL | Status: DC
  Administered 2019-12-09 – 2019-12-10 (×3): 25 mg via ORAL
  Filled 2019-12-09 (×3): qty 1

## 2019-12-09 MED ORDER — CHLORHEXIDINE GLUCONATE CLOTH 2 % EX PADS
6.0000 | MEDICATED_PAD | Freq: Every day | CUTANEOUS | Status: DC
  Administered 2019-12-09 – 2019-12-10 (×2): 6 via TOPICAL

## 2019-12-09 MED ORDER — APIXABAN 5 MG PO TABS
5.0000 mg | ORAL_TABLET | Freq: Two times a day (BID) | ORAL | Status: DC
  Administered 2019-12-09 – 2019-12-10 (×3): 5 mg via ORAL
  Filled 2019-12-09 (×3): qty 1

## 2019-12-09 MED ORDER — TECHNETIUM TC 99M DIETHYLENETRIAME-PENTAACETIC ACID
30.0000 | Freq: Once | INTRAVENOUS | Status: AC | PRN
  Administered 2019-12-09: 30.52 via INTRAVENOUS

## 2019-12-09 NOTE — Progress Notes (Signed)
PROGRESS NOTE    CARTNEY SCHIRMER  [id number] DOB: 06-13-1946 DOA: 12/08/2019 PCP: Birdie Sons, MD  Brief Narrative:  HPI: Kimberly WORMSER is a 74 y.o. female with medical history significant for Colon cancer s/p transverse colectomy, colostomy reversal now with recurrent metastatic adenocarcinoma, hx of breast cancer s/p lumpectomy and radiation treatment, COPD on 2L nocturnal O2, and CAD who presents with concerns of atrial fibrillation with RVR.  She underwent surgery for laparoscopic evaluation of peritoneum and placement of port-a-cath yesterday and was seen at the surgeon office today.  She was noted to have irregular heart rate of 140-160 and was sent to ER for further evaluation. Wide spread peritoneal metastasis was seen in laparoscopy.  Patient reports that she only went to the surgical office today due to burning left lower quadrant pain but denies any chest pain, palpitations or shortness of breath.  No nausea or vomiting.  She has been able to have flatulence since surgery.  Denies any new no lower extremity edema or calf tenderness.  She endorsed previous tobacco use but quit 2 years ago.  Occasional alcohol use and denies any illicit drug use.  Denies any family history of cardiac issues.  3/11: Patient reverted back to normal sinus rhythm.  Cardizem gtt discontinued.  Overall patient feels well.  Currently on heparin infusion.  Cardiology consult called.   Assessment & Plan:   Principal Problem:   Atrial fibrillation with RVR (HCC) Active Problems:   Malignant neoplasm of transverse colon (HCC)   Port-A-Cath in place   COPD (chronic obstructive pulmonary disease) (HCC)   Leukocytosis   Thrombocytosis (HCC)   Iron deficiency anemia due to chronic blood loss   Nocturnal hypoxemia   New onset paroxysmal atrial fibrillation Likely secondary to recent stress from laparoscopic surgery and Port-A-Cath placement.  However she also has high risk for pulmonary  embolism due to her metastatic cancer and recent surgery.  Although other than tachycardia she is not hypoxic and does not have findings of DVT. D-dimer elevated but has cancer. Pt refused iodine contrast due to reaction in the past. VQ scan negative for PE CHA2DS2-VASc score of 3- pt discussed with Dr. Rogue Bussing with heme/onc regarding anticoagulation and she is okay to start from their standpoint. Discussed with general surgery Dr. Hampton Abbot as well since she is POD1 of surgery. Okay to start heparin gtt and monitor closely for any active bleeds or drop in hemoglobin. Plan: DC Cardizem infusion Metoprolol 25 mg twice daily Stop heparin infusion Eliquis 5 mg twice daily We will plan to monitor in house on above regimen overnight.  If hemoglobin stable can discharge home in the morning  Leukocytosis/thrombocytosis Suspect could be reactive to recent stress from surgery.  Continue to closely monitor  Chronic iron deficiency anemia in the setting of metastatic colon cancer Has plans for IV iron infusion with heme-onc Continue to monitor her hemoglobin closely with anticoagulation  Metastatic colon cancer s/p diagnostic peritoneal laparoscopy and right IJ port placement (12/07/19) POD2 Follows with heme/onc Wound care per RN continue colace and PPI continue PRN oxycodone every 4 hrs and Norco q6hr   COPD with chronic nocturnal hypoxemia Continue nighttime 2 L via nasal cannula Continue bronchodilator Continue Singulair    DVT prophylaxis: Eliquis Code Status: Full Family Communication: Daughter Hilda Blades [phone] on 3/11 Disposition Plan: Anticipate discharge home on 12/10/2019 if hemoglobin stable patient rate controlled on metoprolol.  Consultants:   Cardiology- Nehemiah Massed  Procedures:   2D echo  Antimicrobials:  none   Subjective: Seen and examined No acute events overnight Back in sinus rhythm No complaints  Objective: Vitals:   12/09/19 0020 12/09/19 0522  12/09/19 0740 12/09/19 1213  BP: (!) 98/57 (!) 142/71 (!) 142/78 133/71  Pulse: 63 64 62 60  Resp:   18 18  Temp:  97.7 F (36.5 C) 98.2 F (36.8 C) 98 F (36.7 C)  TempSrc:  Oral    SpO2: 94% 95% 96% 93%  Weight:  72 kg    Height:        Intake/Output Summary (Last 24 hours) at 12/09/2019 1418 Last data filed at 12/09/2019 1330 Gross per 24 hour  Intake 480 ml  Output 1075 ml  Net -595 ml   Filed Weights   12/08/19 1602 12/08/19 2244 12/09/19 0522  Weight: 73 kg 72.4 kg 72 kg    Examination:  Eyes: PERRL, lids and conjunctivae normal ENMT: Mucous membranes are moist.  Neck: normal, supple, right IJ port noted Respiratory: Faint expiratory wheezes anteriorly with no crackles. Normal respiratory effort on 2 L via nasal cannula with 92% oxygen saturation. No accessory muscle use.  Cardiovascular: Fluctuating between irregular irregular rhythm to sinus tachycardia on telemetry with rates up to 150, no murmurs / rubs / gallops. No extremity edema. 2+ pedal pulses.  Right IJ port noted to right anterior chest without erythema or significant tenderness to palpation. Abdomen: Mild diffuse tenderness, no masses palpated. Bowel sounds positive.  Clean nonerythematous surgical scar to the left lower quadrant. Musculoskeletal: no clubbing / cyanosis. No joint deformity upper and lower extremities. Good ROM, no contractures. Normal muscle tone.  No bilateral calf tenderness. Skin: no rashes, lesions, ulcers. No induration Neurologic: CN 2-12 grossly intact. Sensation intact. Strength 5/5 in all 4.  Psychiatric: Normal judgment and insight. Alert and oriented x 3. Normal mood.     Data Reviewed: I have personally reviewed following labs and imaging studies  CBC: Recent Labs  Lab 12/08/19 1617 12/09/19 0415  WBC 14.9* 9.6  HGB 11.7* 9.8*  HCT 37.4 32.7*  MCV 85.4 87.7  PLT 618* [id number]*   Basic Metabolic Panel: Recent Labs  Lab 12/08/19 1606 12/08/19 1617 12/09/19 0415  NA  --   136 138  K  --  4.2 4.0  CL  --  101 100  CO2  --  25 30  GLUCOSE  --  137* 99  BUN  --  14 13  CREATININE  --  0.62 0.57  CALCIUM  --  8.8* 8.3*  MG 2.0  --   --    GFR: Estimated Creatinine Clearance: 61.8 mL/min (by C-G formula based on SCr of 0.57 mg/dL). Liver Function Tests: Recent Labs  Lab 12/08/19 1617  AST 17  ALT 9  ALKPHOS 73  BILITOT 0.6  PROT 7.9  ALBUMIN 3.3*   No results for input(s): LIPASE, AMYLASE in the last 168 hours. No results for input(s): AMMONIA in the last 168 hours. Coagulation Profile: Recent Labs  Lab 12/08/19 1929  INR 1.0   Cardiac Enzymes: No results for input(s): CKTOTAL, CKMB, CKMBINDEX, TROPONINI in the last 168 hours. BNP (last 3 results) No results for input(s): PROBNP in the last 8760 hours. HbA1C: No results for input(s): HGBA1C in the last 72 hours. CBG: No results for input(s): GLUCAP in the last 168 hours. Lipid Profile: No results for input(s): CHOL, HDL, LDLCALC, TRIG, CHOLHDL, LDLDIRECT in the last 72 hours. Thyroid Function Tests: Recent Labs    12/08/19 1929  TSH 2.217   Anemia Panel: No results for input(s): VITAMINB12, FOLATE, FERRITIN, TIBC, IRON, RETICCTPCT in the last 72 hours. Sepsis Labs: No results for input(s): PROCALCITON, LATICACIDVEN in the last 168 hours.  Recent Results (from the past 240 hour(s))  SARS CORONAVIRUS 2 (TAT 6-24 HRS) Nasopharyngeal Nasopharyngeal Swab     Status: None   Collection Time: 12/03/19 11:31 AM   Specimen: Nasopharyngeal Swab  Result Value Ref Range Status   SARS Coronavirus 2 NEGATIVE NEGATIVE Final    Comment: (NOTE) SARS-CoV-2 target nucleic acids are NOT DETECTED. The SARS-CoV-2 RNA is generally detectable in upper and lower respiratory specimens during the acute phase of infection. Negative results do not preclude SARS-CoV-2 infection, do not rule out co-infections with other pathogens, and should not be used as the sole basis for treatment or other patient  management decisions. Negative results must be combined with clinical observations, patient history, and epidemiological information. The expected result is Negative. Fact Sheet for Patients: SugarRoll.be Fact Sheet for Healthcare Providers: https://www.woods-mathews.com/ This test is not yet approved or cleared by the [place] FDA and  has been authorized for detection and/or diagnosis of SARS-CoV-2 by FDA under an Emergency Use Authorization (EUA). This EUA will remain  in effect (meaning this test can be used) for the duration of the COVID-19 declaration under Section 56 4(b)(1) of the Act, 21 U.S.C. section 360bbb-3(b)(1), unless the authorization is terminated or revoked sooner. Performed at [facility], [place] [address] [place], [place] [address]   SARS CORONAVIRUS 2 (TAT 6-24 HRS) Nasopharyngeal Nasopharyngeal Swab     Status: None   Collection Time: 12/08/19  6:50 PM   Specimen: Nasopharyngeal Swab  Result Value Ref Range Status   SARS Coronavirus 2 NEGATIVE NEGATIVE Final    Comment: (NOTE) SARS-CoV-2 target nucleic acids are NOT DETECTED. The SARS-CoV-2 RNA is generally detectable in upper and lower respiratory specimens during the acute phase of infection. Negative results do not preclude SARS-CoV-2 infection, do not rule out co-infections with other pathogens, and should not be used as the sole basis for treatment or other patient management decisions. Negative results must be combined with clinical observations, patient history, and epidemiological information. The expected result is Negative. Fact Sheet for Patients: SugarRoll.be Fact Sheet for Healthcare Providers: https://www.woods-mathews.com/ This test is not yet approved or cleared by the [place] FDA and  has been authorized for detection and/or diagnosis of SARS-CoV-2 by FDA under an Emergency Use Authorization  (EUA). This EUA will remain  in effect (meaning this test can be used) for the duration of the COVID-19 declaration under Section 56 4(b)(1) of the Act, 21 U.S.C. section 360bbb-3(b)(1), unless the authorization is terminated or revoked sooner. Performed at [facility], [place] [address] [place], [place] [address]          Radiology Studies: CT ABDOMEN PELVIS WO CONTRAST  Addendum Date: 12/08/2019   ADDENDUM REPORT: 12/08/2019 16:58 ADDENDUM: Upon further review, 11 mm irregular nodular density is seen adjacent to the colonic surgical anastomosis, but may be slightly enlarged compared to prior exam. There is also noted 18 mm nodular density in anterior abdominal wall centrally which may be slightly enlarged compared to prior exam. Etiology of these is unknown, but possible malignancy or metastatic disease cannot be excluded. PET scan is recommended for further evaluation. Bilateral adrenal masses are noted most consistent with adenomas. Mild pneumoperitoneum is noted consistent with recent surgery. Aortic Atherosclerosis (ICD10-I70.0). Electronically Signed   By: Jeneen Rinks  Murlean Caller M.D.   On: 12/08/2019 16:58   Result Date: 12/08/2019 CLINICAL DATA:  Acute generalized abdominal pain. EXAM: CT ABDOMEN AND PELVIS WITHOUT CONTRAST TECHNIQUE: Multidetector CT imaging of the abdomen and pelvis was performed following the standard protocol without IV contrast. COMPARISON:  June 21, 2019. FINDINGS: Lower chest: Minimal right posterior basilar subsegmental atelectasis is noted. Hepatobiliary: No focal liver abnormality is seen. No gallstones, gallbladder wall thickening, or biliary dilatation. Pancreas: Unremarkable. No pancreatic ductal dilatation or surrounding inflammatory changes. Spleen: Normal in size without focal abnormality. Adrenals/Urinary Tract: Stable bilateral adrenal masses are noted most consistent with adenomas. No hydronephrosis or renal obstruction is noted. No renal or ureteral  calculi are noted. Urinary bladder is unremarkable. Stomach/Bowel: Status post right hemicolectomy. The stomach is unremarkable. There is no evidence of bowel obstruction or inflammation. Vascular/Lymphatic: Aortic atherosclerosis. No enlarged abdominal or pelvic lymph nodes. Reproductive: Uterus and bilateral adnexa are unremarkable. Other: Mild pneumoperitoneum is noted consistent with recent surgery. No ascites is noted. Musculoskeletal: No acute or significant osseous findings. Electronically Signed: By: Marijo Conception M.D. On: 12/08/2019 13:14   NM Pulmonary Perf and Vent  Result Date: 12/09/2019 CLINICAL DATA:  Positive D-dimer study. History of breast and colon carcinoma. COPD EXAM: NUCLEAR MEDICINE VENTILATION - PERFUSION LUNG SCAN VIEWS: Anterior, posterior, left lateral, right lateral, RPO, RAO, LPO, LAO-ventilation and perfusion RADIOPHARMACEUTICALS:  30.52 mCi of Tc-72m DTPA aerosol inhalation and 3.86 mCi Tc26m MAA IV COMPARISON:  Chest radiograph December 08, 2019 FINDINGS: Ventilation: Radiotracer uptake on the ventilation study is homogeneous and symmetric bilaterally. No appreciable ventilation defects. Perfusion: Radiotracer uptake on the perfusion study is homogeneous and symmetric bilaterally. No appreciable perfusion defects. IMPRESSION: No appreciable ventilation or perfusion defects. Very low probability of pulmonary embolus. Electronically Signed   By: Lowella Grip III M.D.   On: 12/09/2019 14:14   DG Chest Port 1 View  Result Date: 12/08/2019 CLINICAL DATA:  Left lower quadrant abdominal pain. Surgical procedure yesterday. Recent port placement. EXAM: PORTABLE CHEST 1 VIEW COMPARISON:  CT same day. Chest radiography yesterday. FINDINGS: Power port inserted from a right internal jugular approach. Tip at the SVC RA junction. No pneumothorax. Mild bibasilar atelectasis. Small amount of pneumoperitoneum as shown on the previous CT consistent with yesterday's procedure. IMPRESSION:  Mild bibasilar atelectasis. Power port tip at the SVC RA junction. No pneumothorax. Small amount of pneumoperitoneum as shown on the previous CT. Electronically Signed   By: Nelson Chimes M.D.   On: 12/08/2019 16:48   ECHOCARDIOGRAM COMPLETE  Result Date: 12/09/2019    ECHOCARDIOGRAM REPORT   Patient Name:   ZALAYA GUSTER [facility] Date of Exam: 12/09/2019 Medical Rec #:  [id number]     Height:       64.0 in Accession #:    [id number]    Weight:       158.8 lb Date of Birth:  1945-12-10     BSA:          1.774 m Patient Age:    64 years      BP:           142/78 mmHg Patient Gender: F             HR:           62 bpm. Exam Location:  [facility] Procedure: 2D Echo, Cardiac Doppler and Color Doppler Indications:    Atrial Fibrillation 427.31  History:        Patient has no prior history of  Echocardiogram examinations.                 COPD.  Sonographer:    Sherrie Sport RDCS (AE) Referring Phys: [id number] [facility] Diagnosing      Serafina Royals MD Phys: IMPRESSIONS  1. Left ventricular ejection fraction, by estimation, is 50 to 55%. The left ventricle has low normal function. The left ventricle has no regional wall motion abnormalities. Left ventricular diastolic parameters were normal.  2. Right ventricular systolic function is normal. The right ventricular size is normal.  3. The mitral valve is normal in structure. Trivial mitral valve regurgitation.  4. The aortic valve is normal in structure. Aortic valve regurgitation is not visualized. FINDINGS  Left Ventricle: Left ventricular ejection fraction, by estimation, is 50 to 55%. The left ventricle has low normal function. The left ventricle has no regional wall motion abnormalities. The left ventricular internal cavity size was normal in size. There is no left ventricular hypertrophy. Left ventricular diastolic parameters were normal. Right Ventricle: The right ventricular size is normal. No increase in right ventricular wall thickness. Right ventricular systolic function is normal.  Left Atrium: Left atrial size was normal in size. Right Atrium: Right atrial size was normal in size. Pericardium: There is no evidence of pericardial effusion. Mitral Valve: The mitral valve is normal in structure. Trivial mitral valve regurgitation. Tricuspid Valve: The tricuspid valve is normal in structure. Tricuspid valve regurgitation is trivial. Aortic Valve: The aortic valve is normal in structure. Aortic valve regurgitation is not visualized. Aortic valve mean gradient measures 3.5 mmHg. Aortic valve peak gradient measures 6.0 mmHg. Aortic valve area, by VTI measures 2.29 cm. Pulmonic Valve: The pulmonic valve was normal in structure. Pulmonic valve regurgitation is not visualized. Aorta: The aortic root and ascending aorta are structurally normal, with no evidence of dilitation. IAS/Shunts: No atrial level shunt detected by color flow Doppler.  LEFT VENTRICLE PLAX 2D LVIDd:         4.08 cm  Diastology LVIDs:         2.85 cm  LV e' lateral:   9.36 cm/s LV PW:         1.18 cm  LV E/e' lateral: 11.5 LV IVS:        0.92 cm  LV e' medial:    8.81 cm/s LVOT diam:     2.00 cm  LV E/e' medial:  12.3 LV SV:         56 LV SV Index:   32 LVOT Area:     3.14 cm  RIGHT VENTRICLE RV Basal diam:  4.02 cm LEFT ATRIUM             Index       RIGHT ATRIUM           Index LA diam:        4.40 cm 2.48 cm/m  RA Area:     21.80 cm LA Vol (A2C):   81.2 ml 45.78 ml/m RA Volume:   68.60 ml  38.68 ml/m LA Vol (A4C):   71.0 ml 40.03 ml/m LA Biplane Vol: 75.0 ml 42.29 ml/m  AORTIC VALVE                   PULMONIC VALVE AV Area (Vmax):    2.21 cm    PV Vmax:        0.78 m/s AV Area (Vmean):   2.10 cm    PV Peak grad:   2.4 mmHg AV  Area (VTI):     2.29 cm    RVOT Peak grad: 3 mmHg AV Vmax:           122.00 cm/s AV Vmean:          83.250 cm/s AV VTI:            0.244 m AV Peak Grad:      6.0 mmHg AV Mean Grad:      3.5 mmHg LVOT Vmax:         85.70 cm/s LVOT Vmean:        55.700 cm/s LVOT VTI:          0.178 m LVOT/AV VTI  ratio: 0.73  AORTA Ao Root diam: 2.60 cm MITRAL VALVE MV Area (PHT): 3.37 cm     SHUNTS MV Decel Time: 225 msec     Systemic VTI:  0.18 m MV E velocity: 108.00 cm/s  Systemic Diam: 2.00 cm MV A velocity: 62.10 cm/s MV E/A ratio:  1.74 Serafina Royals MD Electronically signed by Serafina Royals MD Signature Date/Time: 12/09/2019/1:47:20 PM    Final         Scheduled Meds: . apixaban  5 mg Oral BID  . calcium-vitamin D  1 tablet Oral Daily  . cholecalciferol  2,000 Units Oral Daily  . docusate sodium  100 mg Oral Daily  . gabapentin  400 mg Oral TID  . metoprolol tartrate  25 mg Oral BID  . mometasone-formoterol  2 puff Inhalation BID  . montelukast  10 mg Oral Daily  . pantoprazole  40 mg Oral Daily  . pravastatin  40 mg Oral Daily   Continuous Infusions:   LOS: 1 day    Time spent: 35 minutes    Sidney Ace, MD [facility] Pager [phone]  If 7PM-7AM, please contact night-coverage 12/09/2019, 2:18 PM

## 2019-12-09 NOTE — Progress Notes (Signed)
*  PRELIMINARY RESULTS* Echocardiogram 2D Echocardiogram has been performed.  Sherrie Sport 12/09/2019, 10:48 AM

## 2019-12-09 NOTE — Progress Notes (Signed)
Patient seen and examined.  Reports that the pain is much improved in her abdomen. Converted NSR, started on heparin drip She tolerated breakfast this morning. + Flatus, no N/V  PE: NAD  Abd: soft, appropriate mild incisional tenderness, no peritonitis or infection  A/ pDoing well from surgical  Perspective w/o surgical complications Rest of issues per Medicine team I will f/u as outpt

## 2019-12-09 NOTE — Consult Note (Signed)
[facility] Consultation Note  Patient ID: Kimberly [name], MRN: [id number], DOB/AGE: 03-20-1946 74 y.o. Admit date: 12/08/2019   Date of Consult: 12/09/2019 Primary Physician: Birdie Sons, MD Primary Cardiologist: None  Chief Complaint:  Chief Complaint  Patient presents with  . Tachycardia   Reason for Consult: Atrial fibrillation  HPI: 74 y.o. female with known recent: Polyps for which the patient has had need for treatment with surgical exploration borderline hypertension hyperlipidemia and chronic obstructive pulmonary disease who has had new onset shortness of breath and palpitations.  The patient was seen in the office for which she had palpitations and irregular heartbeat and was sent to the emergency room.  At that time an EKG showed atrial fibrillation with rapid ventricular rate.  The patient then had diltiazem drip and heparin for which she has tolerated well and the patient spontaneously converted into normal sinus rhythm.  Currently she is not had any congestive heart failure and/or anginal symptoms and is hemodynamically stable.  The patient did have a slight elevation of troponin consistent with demand ischemia rather than acute coronary syndrome.  Currently she is ambulating well without any further significant issues  Past Medical History:  Diagnosis Date  . Breast cancer, left ([place]) 2004   Lumpectomy and rad tx's.  . Chronic back pain   . Colon cancer ([place]) 2018  . COPD (chronic obstructive pulmonary disease) ([place])   . Degenerative disc disease, lumbar 04/2013  . Family history of adverse reaction to anesthesia    son arrested after anesthesia  . GERD (gastroesophageal reflux disease)   . Iron deficiency anemia 05/27/2017  . Personal history of chemotherapy 2018   Colon  . Personal history of radiation therapy    Breast 2004  . Personal history of radiation therapy 2018   Colon  . Postprocedural intraabdominal abscess 09/01/2018  . Small bowel  obstruction ([place]) 04/16/2017  . Wears dentures    full upper      Surgical History:  Past Surgical History:  Procedure Laterality Date  . APPENDECTOMY  1965  . BREAST EXCISIONAL BIOPSY Left 08/01/2003   lumpectomy rad 11/04-2/28/2005  . BREAST SURGERY    . CESAREAN SECTION     x3  . COLON SURGERY    . COLONOSCOPY W/ POLYPECTOMY    . COLONOSCOPY WITH PROPOFOL N/A 04/09/2018   Procedure: COLONOSCOPY WITH PROPOFOL;  Surgeon: Virgel Manifold, MD;  Location: [facility];  Service: Gastroenterology;  Laterality: N/A;  . COLONOSCOPY WITH PROPOFOL N/A 08/13/2019   Procedure: COLONOSCOPY WITH PROPOFOL;  Surgeon: Virgel Manifold, MD;  Location: [facility];  Service: Endoscopy;  Laterality: N/A;  . COLOSTOMY TAKEDOWN N/A 06/25/2018   Procedure: COLOSTOMY TAKEDOWN;  Surgeon: Jules Husbands, MD;  Location: [facility];  Service: General;  Laterality: N/A;  . ESOPHAGOGASTRODUODENOSCOPY (EGD) WITH PROPOFOL N/A 04/04/2017   Procedure: ESOPHAGOGASTRODUODENOSCOPY (EGD) WITH PROPOFOL;  Surgeon: [name], MD;  Location: [place];  Service: Endoscopy;  Laterality: N/A;  . FRACTURE SURGERY    . HIP FRACTURE SURGERY Left 01/25/2012  . LAPAROSCOPY N/A 12/07/2019   Procedure: LAPAROSCOPY DIAGNOSTIC;  Surgeon: Jules Husbands, MD;  Location: [facility];  Service: General;  Laterality: N/A;  . LAPAROTOMY N/A 04/15/2017   Procedure: EXPLORATORY LAPAROTOMY;  Surgeon: Clayburn Pert, MD;  Location: [facility];  Service: General;  Laterality: N/A;  . NECK SURGERY  12/2011  . PARASTOMAL HERNIA REPAIR N/A 12/03/2017   Procedure: HERNIA REPAIR PARASTOMAL;  Surgeon: Jules Husbands,  MD;  Location: [facility];  Service: General;  Laterality: N/A;  . PARASTOMAL HERNIA REPAIR N/A 06/25/2018   Procedure: HERNIA REPAIR PARASTOMAL;  Surgeon: Jules Husbands, MD;  Location: [facility];  Service: General;  Laterality: N/A;  . PORTACATH PLACEMENT Right 05/21/2017   Procedure: INSERTION PORT-A-CATH;  Surgeon:  Clayburn Pert, MD;  Location: [facility];  Service: General;  Laterality: Right;  . PORTACATH PLACEMENT Right 12/07/2019   Procedure: INSERTION PORT-A-CATH;  Surgeon: Jules Husbands, MD;  Location: [facility];  Service: General;  Laterality: Right;  . WRIST FRACTURE SURGERY       Home Meds: Prior to Admission medications   Medication Sig Start Date End Date Taking? Authorizing Provider  budesonide-formoterol (SYMBICORT) 80-4.5 MCG/ACT inhaler Inhale 2 puffs into the lungs 2 (two) times daily. 06/09/18  Yes Laverle Hobby, MD  calcium citrate-vitamin D (CITRACAL+D) 315-200 MG-UNIT tablet Take 1 tablet by mouth daily.   Yes [provider]  Cholecalciferol (VITAMIN D3) 50 MCG (2000 UT) TABS Take 2,000 Units by mouth daily. 11/05/19  Yes Birdie Sons, MD  docusate sodium (COLACE) 100 MG capsule Take 100 mg by mouth daily.   Yes [provider]  gabapentin (NEURONTIN) 400 MG capsule TAKE 1 CAPSULE THREE TIMES DAILY Patient taking differently: Take 400 mg by mouth 3 (three) times daily.  11/08/19  Yes Earlie Server, MD  HYDROcodone-acetaminophen (NORCO/VICODIN) 5-325 MG tablet Take 1-2 tablets by mouth every 6 (six) hours as needed for moderate pain. 12/07/19  Yes Pabon, [place], MD  lidocaine-prilocaine (EMLA) cream Apply 1 application topically as needed. Apply to port and cover with saran wrap 1.5 hour prior to appointment 12/07/19  Yes Earlie Server, MD  meloxicam (MOBIC) 15 MG tablet TAKE 1 TABLET EVERY DAY AS NEEDED Patient taking differently: Take 15 mg by mouth at bedtime.  05/20/19  Yes Birdie Sons, MD  montelukast (SINGULAIR) 10 MG tablet TAKE 1 TABLET BY MOUTH EVERY DAY Patient taking differently: Take 10 mg by mouth daily.  03/30/19  Yes Laverle Hobby, MD  Multiple Vitamins-Minerals (WOMENS MULTI VITAMIN & MINERAL PO) Take 2 tablets by mouth daily.    Yes [provider]  omeprazole (PRILOSEC) 20 MG capsule Take 1 capsule (20 mg total) by mouth daily. 03/10/19   Yes Birdie Sons, MD  ondansetron (ZOFRAN) 4 MG tablet TAKE 1/2 TO 1 TABLET EVERY 8 HOURS AS NEEDED FOR NAUSEA OR VOMITING Patient taking differently: Take 2-4 mg by mouth every 8 (eight) hours as needed for nausea or vomiting.  11/08/19  Yes Birdie Sons, MD  oxycodone (OXY-IR) 5 MG capsule Take 2 capsules (10 mg total) by mouth every 4 (four) hours as needed. 12/08/19  Yes Pabon, Diego F, MD  pravastatin (PRAVACHOL) 40 MG tablet TAKE 1 TABLET EVERY DAY Patient taking differently: Take 40 mg by mouth daily.  05/20/19  Yes Birdie Sons, MD  Probiotic Product (PROBIOTIC DAILY PO) Take 1 capsule by mouth daily.    Yes [provider]  Triamcinolone Acetonide (NASACORT ALLERGY 24HR NA) Place 2 sprays into the nose daily as needed (allergies).    Yes [provider]    Inpatient Medications:  . apixaban  5 mg Oral BID  . calcium-vitamin D  1 tablet Oral Daily  . cholecalciferol  2,000 Units Oral Daily  . docusate sodium  100 mg Oral Daily  . gabapentin  400 mg Oral TID  . metoprolol tartrate  25 mg Oral BID  . mometasone-formoterol  2 puff Inhalation BID  . montelukast  10 mg Oral Daily  . pantoprazole  40 mg Oral Daily  . pravastatin  40 mg Oral Daily   . heparin 1,050 Units/hr (12/09/19 [id number])    Allergies:  Allergies  Allergen Reactions  . Betadine [Povidone Iodine] Other (See Comments)    Topical iodine she states "if you put it in an open sore it will cause a eating ulcer."  . Iodine Other (See Comments)    Patient states "it gives me infections"  . Other Nausea And Vomiting    Antihystamines  . Sinus Formula [Cholestatin] Nausea Only    Social History   Socioeconomic History  . Marital status: Divorced    Spouse name: Not on file  . Number of children: 3  . Years of education: Not on file  . Highest education level: 7th grade  Occupational History  . Occupation: retired  Tobacco Use  . Smoking status: Former Smoker    Packs/day: 0.25     Years: 55.00    Pack years: 13.75    Types: Cigarettes    Quit date: 05/21/2017    Years since quitting: 2.5  . Smokeless tobacco: Never Used  . Tobacco comment: started age 60 1/3 to 1 ppd  Substance and Sexual Activity  . Alcohol use: Yes    Alcohol/week: 0.0 standard drinks    Comment: occasionally drinks beer  . Drug use: No  . Sexual activity: Never  Other Topics Concern  . Not on file  Social History Narrative   Lives at home alone   Social Determinants of Health   Financial Resource Strain: Medium Risk  . Difficulty of Paying Living Expenses: Somewhat hard  Food Insecurity:   . Worried About Charity fundraiser in the Last Year:   . Arboriculturist in the Last Year:   Transportation Needs:   . Film/video editor (Medical):   Marland Kitchen Lack of Transportation (Non-Medical):   Physical Activity: Inactive  . Days of Exercise per Week: 0 days  . Minutes of Exercise per Session: 0 min  Stress:   . Feeling of Stress :   Social Connections: Unknown  . Frequency of Communication with Friends and Family: Patient refused  . Frequency of Social Gatherings with Friends and Family: Patient refused  . Attends Religious Services: Patient refused  . Active Member of Clubs or Organizations: Patient refused  . Attends Archivist Meetings: Patient refused  . Marital Status: Patient refused  Intimate Partner Violence: Unknown  . Fear of Current or Ex-Partner: Patient refused  . Emotionally Abused: Patient refused  . Physically Abused: Patient refused  . Sexually Abused: Patient refused     Family History  Problem Relation Age of Onset  . Diabetes Mother        type 2  . Coronary artery disease Mother   . Deep vein thrombosis Mother   . Colon cancer Father   . Asthma Brother   . Diabetes Brother        type 2  . Breast cancer Neg Hx      Review of Systems Positive for palpitations Negative for: General:  chills, fever, night sweats or weight changes.   Cardiovascular: PND orthopnea syncope dizziness  Dermatological skin lesions rashes Respiratory: Cough congestion Urologic: Frequent urination urination at night and hematuria Abdominal: negative for nausea, vomiting, diarrhea, bright red blood per rectum, melena, or hematemesis Neurologic: negative for visual changes, and/or hearing changes  All other  systems reviewed and are otherwise negative except as noted above.  Labs: No results for input(s): CKTOTAL, CKMB, TROPONINI in the last 72 hours. Lab Results  Component Value Date   WBC 9.6 12/09/2019   HGB 9.8 (L) 12/09/2019   HCT 32.7 (L) 12/09/2019   MCV 87.7 12/09/2019   PLT 488 (H) 12/09/2019    Recent Labs  Lab 12/08/19 1617 12/08/19 1617 12/09/19 0415  NA 136   < > 138  K 4.2   < > 4.0  CL 101   < > 100  CO2 25   < > 30  BUN 14   < > 13  CREATININE 0.62   < > 0.57  CALCIUM 8.8*   < > 8.3*  PROT 7.9  --   --   BILITOT 0.6  --   --   ALKPHOS 73  --   --   ALT 9  --   --   AST 17  --   --   GLUCOSE 137*   < > 99   < > = values in this interval not displayed.   Lab Results  Component Value Date   CHOL 155 11/03/2019   HDL 43 11/03/2019   LDLCALC 76 11/03/2019   TRIG 216 (H) 11/03/2019   No results found for: DDIMER  Radiology/Studies:  CT ABDOMEN PELVIS WO CONTRAST  Addendum Date: 12/08/2019   ADDENDUM REPORT: 12/08/2019 16:58 ADDENDUM: Upon further review, 11 mm irregular nodular density is seen adjacent to the colonic surgical anastomosis, but may be slightly enlarged compared to prior exam. There is also noted 18 mm nodular density in anterior abdominal wall centrally which may be slightly enlarged compared to prior exam. Etiology of these is unknown, but possible malignancy or metastatic disease cannot be excluded. PET scan is recommended for further evaluation. Bilateral adrenal masses are noted most consistent with adenomas. Mild pneumoperitoneum is noted consistent with recent surgery. Aortic  Atherosclerosis (ICD10-I70.0). Electronically Signed   By: Marijo Conception M.D.   On: 12/08/2019 16:58   Result Date: 12/08/2019 CLINICAL DATA:  Acute generalized abdominal pain. EXAM: CT ABDOMEN AND PELVIS WITHOUT CONTRAST TECHNIQUE: Multidetector CT imaging of the abdomen and pelvis was performed following the standard protocol without IV contrast. COMPARISON:  June 21, 2019. FINDINGS: Lower chest: Minimal right posterior basilar subsegmental atelectasis is noted. Hepatobiliary: No focal liver abnormality is seen. No gallstones, gallbladder wall thickening, or biliary dilatation. Pancreas: Unremarkable. No pancreatic ductal dilatation or surrounding inflammatory changes. Spleen: Normal in size without focal abnormality. Adrenals/Urinary Tract: Stable bilateral adrenal masses are noted most consistent with adenomas. No hydronephrosis or renal obstruction is noted. No renal or ureteral calculi are noted. Urinary bladder is unremarkable. Stomach/Bowel: Status post right hemicolectomy. The stomach is unremarkable. There is no evidence of bowel obstruction or inflammation. Vascular/Lymphatic: Aortic atherosclerosis. No enlarged abdominal or pelvic lymph nodes. Reproductive: Uterus and bilateral adnexa are unremarkable. Other: Mild pneumoperitoneum is noted consistent with recent surgery. No ascites is noted. Musculoskeletal: No acute or significant osseous findings. Electronically Signed: By: Marijo Conception M.D. On: 12/08/2019 13:14   DG Chest Port 1 View  Result Date: 12/08/2019 CLINICAL DATA:  Left lower quadrant abdominal pain. Surgical procedure yesterday. Recent port placement. EXAM: PORTABLE CHEST 1 VIEW COMPARISON:  CT same day. Chest radiography yesterday. FINDINGS: Power port inserted from a right internal jugular approach. Tip at the SVC RA junction. No pneumothorax. Mild bibasilar atelectasis. Small amount of pneumoperitoneum as shown on the previous CT  consistent with yesterday's procedure.  IMPRESSION: Mild bibasilar atelectasis. Power port tip at the SVC RA junction. No pneumothorax. Small amount of pneumoperitoneum as shown on the previous CT. Electronically Signed   By: Nelson Chimes M.D.   On: 12/08/2019 16:48   DG CHEST PORT 1 VIEW  Result Date: 12/07/2019 CLINICAL DATA:  Status post Port-A-Cath placement. EXAM: PORTABLE CHEST 1 VIEW COMPARISON:  October 05, 2019. FINDINGS: Stable cardiomediastinal silhouette. No pneumothorax pleural effusion is noted. Mild bibasilar subsegmental atelectasis or scarring is noted. Interval placement of right internal jugular Port-A-Cath with distal tip in expected position of SVC. Atherosclerosis of thoracic aorta is noted. Bony thorax is unremarkable. IMPRESSION: Interval placement of right internal jugular Port-A-Cath with distal tip in expected position of the SVC. No pneumothorax is noted. Electronically Signed   By: Marijo Conception M.D.   On: 12/07/2019 10:28   DG C-Arm 1-60 Min-No Report  Result Date: 12/07/2019 Fluoroscopy was utilized by the requesting physician.  No radiographic interpretation.   ECHOCARDIOGRAM COMPLETE  Result Date: 12/09/2019    ECHOCARDIOGRAM REPORT   Patient Name:   [facility]ly [name] Date of Exam: 12/09/2019 Medical Rec #:  [id number]     Height:       64.0 in Accession #:    [id number]    Weight:       158.8 lb Date of Birth:  [birth year]     BSA:          1.774 m Patient Age:    36 years      BP:           142/78 mmHg Patient Gender: F             HR:           62 bpm. Exam Location:  [facility] Procedure: 2D Echo, Cardiac Doppler and Color Doppler Indications:    Atrial Fibrillation 427.31  History:        Patient has no prior history of Echocardiogram examinations.                 COPD.  Sonographer:    Sherrie Sport RDCS (AE) Referring Phys: [id number] [facility] Diagnosing      Serafina Royals MD Phys: IMPRESSIONS  1. Left ventricular ejection fraction, by estimation, is 50 to 55%. The left ventricle has low normal function. The left  ventricle has no regional wall motion abnormalities. Left ventricular diastolic parameters were normal.  2. Right ventricular systolic function is normal. The right ventricular size is normal.  3. The mitral valve is normal in structure. Trivial mitral valve regurgitation.  4. The aortic valve is normal in structure. Aortic valve regurgitation is not visualized. FINDINGS  Left Ventricle: Left ventricular ejection fraction, by estimation, is 50 to 55%. The left ventricle has low normal function. The left ventricle has no regional wall motion abnormalities. The left ventricular internal cavity size was normal in size. There is no left ventricular hypertrophy. Left ventricular diastolic parameters were normal. Right Ventricle: The right ventricular size is normal. No increase in right ventricular wall thickness. Right ventricular systolic function is normal. Left Atrium: Left atrial size was normal in size. Right Atrium: Right atrial size was normal in size. Pericardium: There is no evidence of pericardial effusion. Mitral Valve: The mitral valve is normal in structure. Trivial mitral valve regurgitation. Tricuspid Valve: The tricuspid valve is normal in structure. Tricuspid valve regurgitation is trivial. Aortic Valve: The aortic valve is normal in structure. Aortic  valve regurgitation is not visualized. Aortic valve mean gradient measures 3.5 mmHg. Aortic valve peak gradient measures 6.0 mmHg. Aortic valve area, by VTI measures 2.29 cm. Pulmonic Valve: The pulmonic valve was normal in structure. Pulmonic valve regurgitation is not visualized. Aorta: The aortic root and ascending aorta are structurally normal, with no evidence of dilitation. IAS/Shunts: No atrial level shunt detected by color flow Doppler.  LEFT VENTRICLE PLAX 2D LVIDd:         4.08 cm  Diastology LVIDs:         2.85 cm  LV e' lateral:   9.36 cm/s LV PW:         1.18 cm  LV E/e' lateral: 11.5 LV IVS:        0.92 cm  LV e' medial:    8.81 cm/s LVOT  diam:     2.00 cm  LV E/e' medial:  12.3 LV SV:         56 LV SV Index:   32 LVOT Area:     3.14 cm  RIGHT VENTRICLE RV Basal diam:  4.02 cm LEFT ATRIUM             Index       RIGHT ATRIUM           Index LA diam:        4.40 cm 2.48 cm/m  RA Area:     21.80 cm LA Vol (A2C):   81.2 ml 45.78 ml/m RA Volume:   68.60 ml  38.68 ml/m LA Vol (A4C):   71.0 ml 40.03 ml/m LA Biplane Vol: 75.0 ml 42.29 ml/m  AORTIC VALVE                   PULMONIC VALVE AV Area (Vmax):    2.21 cm    PV Vmax:        0.78 m/s AV Area (Vmean):   2.10 cm    PV Peak grad:   2.4 mmHg AV Area (VTI):     2.29 cm    RVOT Peak grad: 3 mmHg AV Vmax:           122.00 cm/s AV Vmean:          83.250 cm/s AV VTI:            0.244 m AV Peak Grad:      6.0 mmHg AV Mean Grad:      3.5 mmHg LVOT Vmax:         85.70 cm/s LVOT Vmean:        55.700 cm/s LVOT VTI:          0.178 m LVOT/AV VTI ratio: 0.73  AORTA Ao Root diam: 2.60 cm MITRAL VALVE MV Area (PHT): 3.37 cm     SHUNTS MV Decel Time: 225 msec     Systemic VTI:  0.18 m MV E velocity: 108.00 cm/s  Systemic Diam: 2.00 cm MV A velocity: 62.10 cm/s MV E/A ratio:  1.74 Serafina Royals MD Electronically signed by Serafina Royals MD Signature Date/Time: 12/09/2019/1:47:20 PM    Final     EKG: Atrial fibrillation with rapid ventricular rate  Weights: Filed Weights   12/08/19 1602 12/08/19 2244 12/09/19 0522  Weight: 73 kg 72.4 kg 72 kg     Physical Exam: Blood pressure 133/71, pulse 60, temperature 98 F (36.7 C), resp. rate 18, height 5\' 4"  (1.626 m), weight 72 kg, SpO2 93 %. Body mass index is 27.26 kg/m. General: Well  developed, well nourished, in no acute distress. Head eyes ears nose throat: Normocephalic, atraumatic, sclera non-icteric, no xanthomas, nares are without discharge. No apparent thyromegaly and/or mass  Lungs: Normal respiratory effort.  no wheezes, no rales, no rhonchi.  Heart: RRR with normal S1 S2. no murmur gallop, no rub, PMI is normal size and placement, carotid  upstroke normal without bruit, jugular venous pressure is normal Abdomen: Soft, non-tender, non-distended with normoactive bowel sounds. No hepatomegaly. No rebound/guarding. No obvious abdominal masses. Abdominal aorta is normal size without bruit Extremities: No edema. no cyanosis, no clubbing, no ulcers  Peripheral : 2+ bilateral upper extremity pulses, 2+ bilateral femoral pulses, 2+ bilateral dorsal pedal pulse Neuro: Alert and oriented. No facial asymmetry. No focal deficit. Moves all extremities spontaneously. Musculoskeletal: Normal muscle tone without kyphosis Psych:  Responds to questions appropriately with a normal affect.    Assessment: 74 year old female with hypertension hyperlipidemia chronic obstructive pulmonary disease having an acute onset of atrial fibrillation with rapid ventricular rate now converted into normal sinus rhythm hemodynamically stable without evidence of congestive heart failure or myocardial infarction  Plan: 1.  Discontinuation diltiazem drip and change to metoprolol 25 mg twice per day 2.  Eliquis 5 mg twice per day for further risk reduction in stroke with atrial fibrillation for at least 1 month 3.  No further cardiac intervention at this time due to no evidence of congestive heart failure myocardial infarction 4.  Continue high intensity cholesterol therapy 5.  Begin ambulation and follow for improvements of symptoms and okay for discharge home from cardiac standpoint with follow-up in next week  Signed, Corey Skains M.D. [facility] 12/09/2019, 2:01 PM

## 2019-12-09 NOTE — Progress Notes (Signed)
Pt has converted to NSR 60's.  NP B. Randol Kern notified and Card gtt stopped.

## 2019-12-09 NOTE — Consult Note (Signed)
[place] for heparin  Indication: atrial fibrillation  Allergies  Allergen Reactions  . Betadine [Povidone Iodine] Other (See Comments)    Topical iodine she states "if you put it in an open sore it will cause a eating ulcer."  . Iodine Other (See Comments)    Patient states "it gives me infections"  . Other Nausea And Vomiting    Antihystamines  . Sinus Formula [Cholestatin] Nausea Only    Patient Measurements: Height: 5\' 4"  (162.6 cm) Weight: 158 lb 12.8 oz (72 kg) IBW/kg (Calculated) : 54.7 Heparin Dosing Weight: 69.8 kg   Vital Signs: Temp: 97.7 F (36.5 C) (03/11 0522) Temp Source: Oral (03/11 0522) BP: 142/71 (03/11 0522) Pulse Rate: 64 (03/11 0522)  Labs: Recent Labs    12/08/19 1617 12/08/19 1929 12/08/19 1930 12/08/19 2255 12/09/19 0415  HGB 11.7*  --   --   --  9.8*  HCT 37.4  --   --   --  32.7*  PLT 618*  --   --   --  488*  APTT  --  34  --   --   --   LABPROT  --  13.2  --   --   --   INR  --  1.0  --   --   --   HEPARINUNFRC  --   --   --   --  0.21*  CREATININE 0.62  --   --   --  0.57  TROPONINIHS 10  --  10 12  --     Estimated Creatinine Clearance: 61.8 mL/min (by C-G formula based on SCr of 0.57 mg/dL).   Medications:  No PTA anticoagulant (confirmed with patient)  Assessment: Pharmacy consulted for heparin dosing in a patient with atrial fibrillation. Will order BL INR and aPTT. CBC already ordered; platelet elevated.    Goal of Therapy:  Heparin level 0.3-0.7 units/ml Monitor platelets by anticoagulation protocol: Yes   Plan:  03/11 @ 0400 HL 0.21 subtherapeutic. Per RN drip not stopped, will increase rate to 1050 units/hr and will recheck HL at 1400, CBC trending down will continue to monitor.  Tobie Lords, PharmD, BCPS Clinical Pharmacist 12/09/2019,6:16 AM

## 2019-12-10 LAB — CBC WITH DIFFERENTIAL/PLATELET
Abs Immature Granulocytes: 0.04 10*3/uL (ref 0.00–0.07)
Basophils Absolute: 0.1 10*3/uL (ref 0.0–0.1)
Basophils Relative: 1 %
Eosinophils Absolute: 0.2 10*3/uL (ref 0.0–0.5)
Eosinophils Relative: 2 %
HCT: 34.6 % — ABNORMAL LOW (ref 36.0–46.0)
Hemoglobin: 10.6 g/dL — ABNORMAL LOW (ref 12.0–15.0)
Immature Granulocytes: 0 %
Lymphocytes Relative: 16 %
Lymphs Abs: 1.7 10*3/uL (ref 0.7–4.0)
MCH: 26.5 pg (ref 26.0–34.0)
MCHC: 30.6 g/dL (ref 30.0–36.0)
MCV: 86.5 fL (ref 80.0–100.0)
Monocytes Absolute: 1.3 10*3/uL — ABNORMAL HIGH (ref 0.1–1.0)
Monocytes Relative: 12 %
Neutro Abs: 7.3 10*3/uL (ref 1.7–7.7)
Neutrophils Relative %: 69 %
Platelets: 507 10*3/uL — ABNORMAL HIGH (ref 150–400)
RBC: 4 MIL/uL (ref 3.87–5.11)
RDW: 16.5 % — ABNORMAL HIGH (ref 11.5–15.5)
WBC: 10.6 10*3/uL — ABNORMAL HIGH (ref 4.0–10.5)
nRBC: 0 % (ref 0.0–0.2)

## 2019-12-10 LAB — BASIC METABOLIC PANEL
Anion gap: 8 (ref 5–15)
BUN: 17 mg/dL (ref 8–23)
CO2: 32 mmol/L (ref 22–32)
Calcium: 8.7 mg/dL — ABNORMAL LOW (ref 8.9–10.3)
Chloride: 98 mmol/L (ref 98–111)
Creatinine, Ser: 0.73 mg/dL (ref 0.44–1.00)
GFR calc Af Amer: >60 mL/min (ref >60)
GFR calc non Af Amer: >60 mL/min (ref >60)
Glucose, Bld: 97 mg/dL (ref 70–99)
Potassium: 4.1 mmol/L (ref 3.5–5.1)
Sodium: 138 mmol/L (ref 135–145)

## 2019-12-10 LAB — MAGNESIUM: Magnesium: 2.1 mg/dL (ref 1.7–2.4)

## 2019-12-10 MED ORDER — APIXABAN 5 MG PO TABS: 5.0000 mg | ORAL_TABLET | Freq: Two times a day (BID) | ORAL | 0 refills | Status: DC

## 2019-12-10 MED ORDER — METOPROLOL TARTRATE 25 MG PO TABS: 25.0000 mg | ORAL_TABLET | Freq: Two times a day (BID) | ORAL | 0 refills | Status: DC

## 2019-12-10 NOTE — Progress Notes (Signed)
Patient ambulated in the hallway on room air without any distress. Tolerated walk with oxygen saturation at 92% on room air.

## 2019-12-10 NOTE — Anesthesia Postprocedure Evaluation (Signed)
Anesthesia Post Note  Patient: Kimberly [name]  Procedure(s) Performed: INSERTION PORT-A-CATH (Right Chest) LAPAROSCOPY DIAGNOSTIC (N/A )  Patient location during evaluation: PACU Anesthesia Type: General Level of consciousness: awake and alert and oriented Pain management: pain level controlled Vital Signs Assessment: post-procedure vital signs reviewed and stable Respiratory status: spontaneous breathing Cardiovascular status: blood pressure returned to baseline Anesthetic complications: no     Last Vitals:  Vitals:   12/07/19 1235 12/07/19 1240  BP:    Pulse:    Resp:    Temp:    SpO2: 96% 97%    Last Pain:  Vitals:   12/08/19 0904  TempSrc:   PainSc: 8                  Glynis Hunsucker

## 2019-12-10 NOTE — Progress Notes (Signed)
Patient given discharge instructions. Case management provided eliquis coupon and this RN educated on medication. IV taken out and tele monitor taken off. MD reached out to put in follow up appointment with cardiology. Appointment made. Patient verbalized understanding without any questions or concerns.

## 2019-12-10 NOTE — Progress Notes (Signed)
[facility] Encounter Note  Patient: Kimberly [name] / Admit Date: 12/08/2019 / Date of Encounter: 12/10/2019, 7:05 AM   Subjective: Patient feels well today with no evidence of chest pain or congestive heart failure symptoms.  Atrial fibrillation converted to normal sinus rhythm with minimal medication management.  Currently on metoprolol with good heart rate control.  Tolerating anticoagulation without difficulty.  Patient does have some rhonchi and congestion  Review of Systems: Positive for: Shortness of breath Negative for: Vision change, hearing change, syncope, dizziness, nausea, vomiting,diarrhea, bloody stool, stomach pain, cough, congestion, diaphoresis, urinary frequency, urinary pain,skin lesions, skin rashes Others previously listed  Objective: Telemetry: Normal sinus rhythm Physical Exam: Blood pressure (!) 113/57, pulse (!) 58, temperature 98.1 F (36.7 C), temperature source Oral, resp. rate 20, height 5\' 4"  (1.626 m), weight 71.5 kg, SpO2 93 %. Body mass index is 27.05 kg/m. General: Well developed, well nourished, in no acute distress. Head: Normocephalic, atraumatic, sclera non-icteric, no xanthomas, nares are without discharge. Neck: No apparent masses Lungs: Normal respirations with no wheezes, some rhonchi, no rales , no crackles   Heart: Regular rate and rhythm, normal S1 S2, no murmur, no rub, no gallop, PMI is normal size and placement, carotid upstroke normal without bruit, jugular venous pressure normal Abdomen: Soft, non-tender, non-distended with normoactive bowel sounds. No hepatosplenomegaly. Abdominal aorta is normal size without bruit Extremities: No edema, no clubbing, no cyanosis, no ulcers,  Peripheral: 2+ radial, 2+ femoral, 2+ dorsal pedal pulses Neuro: Alert and oriented. Moves all extremities spontaneously. Psych:  Responds to questions appropriately with a normal affect.   Intake/Output Summary (Last 24 hours) at 12/10/2019  0705 Last data filed at 12/09/2019 1800 Gross per 24 hour  Intake 720 ml  Output 675 ml  Net 45 ml    Inpatient Medications:  . apixaban  5 mg Oral BID  . calcium-vitamin D  1 tablet Oral Daily  . Chlorhexidine Gluconate Cloth  6 each Topical Daily  . cholecalciferol  2,000 Units Oral Daily  . docusate sodium  100 mg Oral Daily  . gabapentin  400 mg Oral TID  . metoprolol tartrate  25 mg Oral BID  . mometasone-formoterol  2 puff Inhalation BID  . montelukast  10 mg Oral Daily  . pantoprazole  40 mg Oral Daily  . pravastatin  40 mg Oral Daily   Infusions:   Labs: Recent Labs    12/08/19 1606 12/08/19 1617 12/09/19 0415 12/10/19 0511  NA  --    < > 138 138  K  --    < > 4.0 4.1  CL  --    < > 100 98  CO2  --    < > 30 32  GLUCOSE  --    < > 99 97  BUN  --    < > 13 17  CREATININE  --    < > 0.57 0.73  CALCIUM  --    < > 8.3* 8.7*  MG 2.0  --   --  2.1   < > = values in this interval not displayed.   Recent Labs    12/08/19 1617  AST 17  ALT 9  ALKPHOS 73  BILITOT 0.6  PROT 7.9  ALBUMIN 3.3*   Recent Labs    12/09/19 0415 12/10/19 0511  WBC 9.6 10.6*  NEUTROABS  --  7.3  HGB 9.8* 10.6*  HCT 32.7* 34.6*  MCV 87.7 86.5  PLT 488* 507*  No results for input(s): CKTOTAL, CKMB, TROPONINI in the last 72 hours. Invalid input(s): POCBNP No results for input(s): HGBA1C in the last 72 hours.   Weights: Filed Weights   12/08/19 2244 12/09/19 0522 12/10/19 0450  Weight: 72.4 kg 72 kg 71.5 kg     Radiology/Studies:  CT ABDOMEN PELVIS WO CONTRAST  Addendum Date: 12/08/2019   ADDENDUM REPORT: 12/08/2019 16:58 ADDENDUM: Upon further review, 11 mm irregular nodular density is seen adjacent to the colonic surgical anastomosis, but may be slightly enlarged compared to prior exam. There is also noted 18 mm nodular density in anterior abdominal wall centrally which may be slightly enlarged compared to prior exam. Etiology of these is unknown, but possible malignancy  or metastatic disease cannot be excluded. PET scan is recommended for further evaluation. Bilateral adrenal masses are noted most consistent with adenomas. Mild pneumoperitoneum is noted consistent with recent surgery. Aortic Atherosclerosis (ICD10-I70.0). Electronically Signed   By: Marijo Conception M.D.   On: 12/08/2019 16:58   Result Date: 12/08/2019 CLINICAL DATA:  Acute generalized abdominal pain. EXAM: CT ABDOMEN AND PELVIS WITHOUT CONTRAST TECHNIQUE: Multidetector CT imaging of the abdomen and pelvis was performed following the standard protocol without IV contrast. COMPARISON:  June 21, 2019. FINDINGS: Lower chest: Minimal right posterior basilar subsegmental atelectasis is noted. Hepatobiliary: No focal liver abnormality is seen. No gallstones, gallbladder wall thickening, or biliary dilatation. Pancreas: Unremarkable. No pancreatic ductal dilatation or surrounding inflammatory changes. Spleen: Normal in size without focal abnormality. Adrenals/Urinary Tract: Stable bilateral adrenal masses are noted most consistent with adenomas. No hydronephrosis or renal obstruction is noted. No renal or ureteral calculi are noted. Urinary bladder is unremarkable. Stomach/Bowel: Status post right hemicolectomy. The stomach is unremarkable. There is no evidence of bowel obstruction or inflammation. Vascular/Lymphatic: Aortic atherosclerosis. No enlarged abdominal or pelvic lymph nodes. Reproductive: Uterus and bilateral adnexa are unremarkable. Other: Mild pneumoperitoneum is noted consistent with recent surgery. No ascites is noted. Musculoskeletal: No acute or significant osseous findings. Electronically Signed: By: Marijo Conception M.D. On: 12/08/2019 13:14   NM Pulmonary Perf and Vent  Result Date: 12/09/2019 CLINICAL DATA:  Positive D-dimer study. History of breast and colon carcinoma. COPD EXAM: NUCLEAR MEDICINE VENTILATION - PERFUSION LUNG SCAN VIEWS: Anterior, posterior, left lateral, right lateral, RPO,  RAO, LPO, LAO-ventilation and perfusion RADIOPHARMACEUTICALS:  30.52 mCi of Tc-56m DTPA aerosol inhalation and 3.86 mCi Tc47m MAA IV COMPARISON:  Chest radiograph December 08, 2019 FINDINGS: Ventilation: Radiotracer uptake on the ventilation study is homogeneous and symmetric bilaterally. No appreciable ventilation defects. Perfusion: Radiotracer uptake on the perfusion study is homogeneous and symmetric bilaterally. No appreciable perfusion defects. IMPRESSION: No appreciable ventilation or perfusion defects. Very low probability of pulmonary embolus. Electronically Signed   By: Lowella Grip III M.D.   On: 12/09/2019 14:14   DG Chest Port 1 View  Result Date: 12/08/2019 CLINICAL DATA:  Left lower quadrant abdominal pain. Surgical procedure yesterday. Recent port placement. EXAM: PORTABLE CHEST 1 VIEW COMPARISON:  CT same day. Chest radiography yesterday. FINDINGS: Power port inserted from a right internal jugular approach. Tip at the SVC RA junction. No pneumothorax. Mild bibasilar atelectasis. Small amount of pneumoperitoneum as shown on the previous CT consistent with yesterday's procedure. IMPRESSION: Mild bibasilar atelectasis. Power port tip at the SVC RA junction. No pneumothorax. Small amount of pneumoperitoneum as shown on the previous CT. Electronically Signed   By: Nelson Chimes M.D.   On: 12/08/2019 16:48   DG CHEST PORT 1 VIEW  Result Date: 12/07/2019 CLINICAL DATA:  Status post Port-A-Cath placement. EXAM: PORTABLE CHEST 1 VIEW COMPARISON:  October 05, 2019. FINDINGS: Stable cardiomediastinal silhouette. No pneumothorax pleural effusion is noted. Mild bibasilar subsegmental atelectasis or scarring is noted. Interval placement of right internal jugular Port-A-Cath with distal tip in expected position of SVC. Atherosclerosis of thoracic aorta is noted. Bony thorax is unremarkable. IMPRESSION: Interval placement of right internal jugular Port-A-Cath with distal tip in expected position of the SVC.  No pneumothorax is noted. Electronically Signed   By: Marijo Conception M.D.   On: 12/07/2019 10:28   DG C-Arm 1-60 Min-No Report  Result Date: 12/07/2019 Fluoroscopy was utilized by the requesting physician.  No radiographic interpretation.   ECHOCARDIOGRAM COMPLETE  Result Date: 12/09/2019    ECHOCARDIOGRAM REPORT   Patient Name:   SILVERIA LAKO [facility] Date of Exam: 12/09/2019 Medical Rec #:  [id number]     Height:       64.0 in Accession #:    [id number]    Weight:       158.8 lb Date of Birth:  12-19-1945     BSA:          1.774 m Patient Age:    30 years      BP:           142/78 mmHg Patient Gender: F             HR:           62 bpm. Exam Location:  [facility] Procedure: 2D Echo, Cardiac Doppler and Color Doppler Indications:    Atrial Fibrillation 427.31  History:        Patient has no prior history of Echocardiogram examinations.                 COPD.  Sonographer:    Sherrie Sport RDCS (AE) Referring Phys: [id number] [facility] Diagnosing      Serafina Royals MD Phys: IMPRESSIONS  1. Left ventricular ejection fraction, by estimation, is 50 to 55%. The left ventricle has low normal function. The left ventricle has no regional wall motion abnormalities. Left ventricular diastolic parameters were normal.  2. Right ventricular systolic function is normal. The right ventricular size is normal.  3. The mitral valve is normal in structure. Trivial mitral valve regurgitation.  4. The aortic valve is normal in structure. Aortic valve regurgitation is not visualized. FINDINGS  Left Ventricle: Left ventricular ejection fraction, by estimation, is 50 to 55%. The left ventricle has low normal function. The left ventricle has no regional wall motion abnormalities. The left ventricular internal cavity size was normal in size. There is no left ventricular hypertrophy. Left ventricular diastolic parameters were normal. Right Ventricle: The right ventricular size is normal. No increase in right ventricular wall thickness. Right ventricular  systolic function is normal. Left Atrium: Left atrial size was normal in size. Right Atrium: Right atrial size was normal in size. Pericardium: There is no evidence of pericardial effusion. Mitral Valve: The mitral valve is normal in structure. Trivial mitral valve regurgitation. Tricuspid Valve: The tricuspid valve is normal in structure. Tricuspid valve regurgitation is trivial. Aortic Valve: The aortic valve is normal in structure. Aortic valve regurgitation is not visualized. Aortic valve mean gradient measures 3.5 mmHg. Aortic valve peak gradient measures 6.0 mmHg. Aortic valve area, by VTI measures 2.29 cm. Pulmonic Valve: The pulmonic valve was normal in structure. Pulmonic valve regurgitation is not visualized. Aorta: The aortic root and ascending aorta are  structurally normal, with no evidence of dilitation. IAS/Shunts: No atrial level shunt detected by color flow Doppler.  LEFT VENTRICLE PLAX 2D LVIDd:         4.08 cm  Diastology LVIDs:         2.85 cm  LV e' lateral:   9.36 cm/s LV PW:         1.18 cm  LV E/e' lateral: 11.5 LV IVS:        0.92 cm  LV e' medial:    8.81 cm/s LVOT diam:     2.00 cm  LV E/e' medial:  12.3 LV SV:         56 LV SV Index:   32 LVOT Area:     3.14 cm  RIGHT VENTRICLE RV Basal diam:  4.02 cm LEFT ATRIUM             Index       RIGHT ATRIUM           Index LA diam:        4.40 cm 2.48 cm/m  RA Area:     21.80 cm LA Vol (A2C):   81.2 ml 45.78 ml/m RA Volume:   68.60 ml  38.68 ml/m LA Vol (A4C):   71.0 ml 40.03 ml/m LA Biplane Vol: 75.0 ml 42.29 ml/m  AORTIC VALVE                   PULMONIC VALVE AV Area (Vmax):    2.21 cm    PV Vmax:        0.78 m/s AV Area (Vmean):   2.10 cm    PV Peak grad:   2.4 mmHg AV Area (VTI):     2.29 cm    RVOT Peak grad: 3 mmHg AV Vmax:           122.00 cm/s AV Vmean:          83.250 cm/s AV VTI:            0.244 m AV Peak Grad:      6.0 mmHg AV Mean Grad:      3.5 mmHg LVOT Vmax:         85.70 cm/s LVOT Vmean:        55.700 cm/s LVOT VTI:           0.178 m LVOT/AV VTI ratio: 0.73  AORTA Ao Root diam: 2.60 cm MITRAL VALVE MV Area (PHT): 3.37 cm     SHUNTS MV Decel Time: 225 msec     Systemic VTI:  0.18 m MV E velocity: 108.00 cm/s  Systemic Diam: 2.00 cm MV A velocity: 62.10 cm/s MV E/A ratio:  1.74 Serafina Royals MD Electronically signed by Serafina Royals MD Signature Date/Time: 12/09/2019/1:47:20 PM    Final      Assessment and Recommendation  74 y.o. female with new onset atrial fibrillation with rapid ventricular rate now spontaneously converted to normal sinus rhythm and stable on metoprolol and anticoagulation 1.  Continue metoprolol for heart rate control and maintenance of normal rhythm 2.  Continue anticoagulation for further risk reduction in stroke with atrial fibrillation 3.  Begin ambulation today and follow for improvements of symptoms and possible discharge to home if ambulating well with no current symptoms and follow-up in 1 to 2 weeks for further adjustments of medications  Signed, Serafina Royals M.D. FACC

## 2019-12-10 NOTE — Discharge Summary (Signed)
Physician Discharge Summary  Kimberly [name] [id number] DOB: 25-Dec-1945 DOA: 12/08/2019  PCP: Birdie Sons, MD  Admit date: 12/08/2019 Discharge date: 12/10/2019  Admitted From: Home Disposition:  Home  Recommendations for Outpatient Follow-up:  1. Follow up with PCP in 1-2 weeks 2. Follow up with cardiology 1-2 weeks  Home Health:NO Equipment/Devices: None Discharge Condition: Stable CODE STATUS: Full Diet recommendation: Regular  Brief/Interim Summary: [id number] B Kimberly [name] a 74 y.o.femalewith medical history significant forColon cancer s/p transverse colectomy, colostomy reversal now with recurrent metastatic adenocarcinoma, hx of breast cancer s/p lumpectomy and radiation treatment, COPD on 2L nocturnal O2, and CADwho presents with concerns of atrial fibrillation with RVR.  Sheunderwentsurgery for laparoscopic evaluation of peritoneum and placement of port-a-cath yesterdayand was seen at the surgeon office today. She was noted to have irregular heart rate of 140-160 and was sent to ER for further evaluation. Wide spread peritoneal metastasiswas seen in laparoscopy.  Patient reports that she only went to the surgical office today due to burning left lower quadrant pain but denies any chest pain, palpitations or shortness of breath. No nausea or vomiting. She has been able to have flatulence since surgery. Denies any new no lower extremity edema or calf tenderness.  She endorsed previous tobacco use but quit 2 years ago. Occasional alcohol use and denies any illicit drug use.  Denies any family history of cardiac issues.  3/11: Patient reverted back to normal sinus rhythm.  Cardizem gtt discontinued.  Overall patient feels well.  Currently on heparin infusion.  Cardiology consult called.  3/12: Patient seen and examined.  Remains in sinus rhythm.  On metoprolol p.o. and Eliquis.  Medically stable for discharge home at this time.  Will prescribe metoprolol and  Eliquis for rate control.  Follow-up with cardiology 1 to 2 weeks.  Discharge Diagnoses:  Principal Problem:   Atrial fibrillation with RVR ([place]) Active Problems:   Malignant neoplasm of transverse colon (HCC)   Port-A-Cath in place   COPD (chronic obstructive pulmonary disease) (HCC)   Leukocytosis   Thrombocytosis (HCC)   Iron deficiency anemia due to chronic blood loss   Nocturnal hypoxemia   New onset paroxysmal atrial fibrillation Likely secondary to recent stress from laparoscopic surgery and Port-A-Cath placement. However she also has high risk for pulmonary embolism due to her metastatic cancer and recent surgery. Although other than tachycardia she is not hypoxic and does not have findings of DVT. D-dimer elevated but has cancer. Pt refused iodine contrast due to reaction in the past. VQ scan negative for PE CHA2DS2-VASc score of 3- pt discussed with Dr. Rogue Bussing with heme/onc regarding anticoagulation and she is okay to start from their standpoint. Discussed with general surgery Dr. Hampton Abbot as well since she is POD1of surgery. Okay to start heparin gtt and monitor closely for any active bleeds or drop in hemoglobin. Plan: Cardizem infusion stopped Metoprolol 25 mg twice daily, prescribed on discharge Eliquis 5 mg twice daily, prescribed on discharge Discharged home on above regimen with instructions to follow-up with cardiology  Leukocytosis/thrombocytosis Suspect could be reactive to recent stress from surgery.  Continue to closely monitor  Chronic iron deficiency anemia in the setting of metastatic colon cancer Has plans for IV iron infusion with heme-onc Continue to monitor her hemoglobin closely with anticoagulation  Metastatic colon cancer s/p diagnostic peritoneal laparoscopy and right IJ port placement(12/07/19) POD3 Follows with heme/onc Wound care per RN continue colace and PPI continue PRN oxycodone every 4 hrs and Norco q6hr   COPD  with chronic  nocturnal hypoxemia Continue nighttime 2 L via nasal cannula Continue bronchodilator Continue Singulair  Discharge Instructions  Discharge Instructions    Diet - low sodium heart healthy   Complete by: As directed    Increase activity slowly   Complete by: As directed      Allergies as of 12/10/2019      Reactions   Betadine [povidone Iodine] Other (See Comments)   Topical iodine she states "if you put it in an open sore it will cause a eating ulcer."   Iodine Other (See Comments)   Patient states "it gives me infections"   Other Nausea And Vomiting   Antihystamines   Sinus Formula [cholestatin] Nausea Only      Medication List    TAKE these medications   apixaban 5 MG Tabs tablet Commonly known as: ELIQUIS Take 1 tablet (5 mg total) by mouth 2 (two) times daily.   budesonide-formoterol 80-4.5 MCG/ACT inhaler Commonly known as: SYMBICORT Inhale 2 puffs into the lungs 2 (two) times daily.   calcium citrate-vitamin D 315-200 MG-UNIT tablet Commonly known as: CITRACAL+D Take 1 tablet by mouth daily.   docusate sodium 100 MG capsule Commonly known as: COLACE Take 100 mg by mouth daily.   gabapentin 400 MG capsule Commonly known as: NEURONTIN TAKE 1 CAPSULE THREE TIMES DAILY What changed: See the new instructions.   HYDROcodone-acetaminophen 5-325 MG tablet Commonly known as: NORCO/VICODIN Take 1-2 tablets by mouth every 6 (six) hours as needed for moderate pain.   lidocaine-prilocaine cream Commonly known as: EMLA Apply 1 application topically as needed. Apply to port and cover with saran wrap 1.5 hour prior to appointment   meloxicam 15 MG tablet Commonly known as: MOBIC TAKE 1 TABLET EVERY DAY AS NEEDED What changed: See the new instructions.   metoprolol tartrate 25 MG tablet Commonly known as: LOPRESSOR Take 1 tablet (25 mg total) by mouth 2 (two) times daily.   montelukast 10 MG tablet Commonly known as: SINGULAIR TAKE 1 TABLET BY MOUTH EVERY DAY    NASACORT ALLERGY 24HR NA Place 2 sprays into the nose daily as needed (allergies).   omeprazole 20 MG capsule Commonly known as: PRILOSEC Take 1 capsule (20 mg total) by mouth daily.   ondansetron 4 MG tablet Commonly known as: ZOFRAN TAKE 1/2 TO 1 TABLET EVERY 8 HOURS AS NEEDED FOR NAUSEA OR VOMITING What changed: See the new instructions.   oxycodone 5 MG capsule Commonly known as: OXY-IR Take 2 capsules (10 mg total) by mouth every 4 (four) hours as needed.   pravastatin 40 MG tablet Commonly known as: PRAVACHOL TAKE 1 TABLET EVERY DAY   PROBIOTIC DAILY PO Take 1 capsule by mouth daily.   Vitamin D3 50 MCG (2000 UT) Tabs Take 2,000 Units by mouth daily.   WOMENS MULTI VITAMIN & MINERAL PO Take 2 tablets by mouth daily.      Follow-up Information    Corey Skains, MD On 12/22/2019.   Specialty: Cardiology Why: appointment at 10am Contact information: [facility] [place] [address] [phone]          Allergies  Allergen Reactions  . Betadine [Povidone Iodine] Other (See Comments)    Topical iodine she states "if you put it in an open sore it will cause a eating ulcer."  . Iodine Other (See Comments)    Patient states "it gives me infections"  . Other Nausea And Vomiting    Antihystamines  .  Sinus Formula [Cholestatin] Nausea Only    Consultations:  Cardiology- Dr. Nehemiah Massed   Procedures/Studies: CT ABDOMEN PELVIS WO CONTRAST  Addendum Date: 12/08/2019   ADDENDUM REPORT: 12/08/2019 16:58 ADDENDUM: Upon further review, 11 mm irregular nodular density is seen adjacent to the colonic surgical anastomosis, but may be slightly enlarged compared to prior exam. There is also noted 18 mm nodular density in anterior abdominal wall centrally which may be slightly enlarged compared to prior exam. Etiology of these is unknown, but possible malignancy or metastatic disease cannot be excluded. PET scan is  recommended for further evaluation. Bilateral adrenal masses are noted most consistent with adenomas. Mild pneumoperitoneum is noted consistent with recent surgery. Aortic Atherosclerosis (ICD10-I70.0). Electronically Signed   By: Marijo Conception M.D.   On: 12/08/2019 16:58   Result Date: 12/08/2019 CLINICAL DATA:  Acute generalized abdominal pain. EXAM: CT ABDOMEN AND PELVIS WITHOUT CONTRAST TECHNIQUE: Multidetector CT imaging of the abdomen and pelvis was performed following the standard protocol without IV contrast. COMPARISON:  June 21, 2019. FINDINGS: Lower chest: Minimal right posterior basilar subsegmental atelectasis is noted. Hepatobiliary: No focal liver abnormality is seen. No gallstones, gallbladder wall thickening, or biliary dilatation. Pancreas: Unremarkable. No pancreatic ductal dilatation or surrounding inflammatory changes. Spleen: Normal in size without focal abnormality. Adrenals/Urinary Tract: Stable bilateral adrenal masses are noted most consistent with adenomas. No hydronephrosis or renal obstruction is noted. No renal or ureteral calculi are noted. Urinary bladder is unremarkable. Stomach/Bowel: Status post right hemicolectomy. The stomach is unremarkable. There is no evidence of bowel obstruction or inflammation. Vascular/Lymphatic: Aortic atherosclerosis. No enlarged abdominal or pelvic lymph nodes. Reproductive: Uterus and bilateral adnexa are unremarkable. Other: Mild pneumoperitoneum is noted consistent with recent surgery. No ascites is noted. Musculoskeletal: No acute or significant osseous findings. Electronically Signed: By: Marijo Conception M.D. On: 12/08/2019 13:14   NM Pulmonary Perf and Vent  Result Date: 12/09/2019 CLINICAL DATA:  Positive D-dimer study. History of breast and colon carcinoma. COPD EXAM: NUCLEAR MEDICINE VENTILATION - PERFUSION LUNG SCAN VIEWS: Anterior, posterior, left lateral, right lateral, RPO, RAO, LPO, LAO-ventilation and perfusion  RADIOPHARMACEUTICALS:  30.52 mCi of Tc-80m DTPA aerosol inhalation and 3.86 mCi Tc25m MAA IV COMPARISON:  Chest radiograph December 08, 2019 FINDINGS: Ventilation: Radiotracer uptake on the ventilation study is homogeneous and symmetric bilaterally. No appreciable ventilation defects. Perfusion: Radiotracer uptake on the perfusion study is homogeneous and symmetric bilaterally. No appreciable perfusion defects. IMPRESSION: No appreciable ventilation or perfusion defects. Very low probability of pulmonary embolus. Electronically Signed   By: Lowella Grip III M.D.   On: 12/09/2019 14:14   DG Chest Port 1 View  Result Date: 12/08/2019 CLINICAL DATA:  Left lower quadrant abdominal pain. Surgical procedure yesterday. Recent port placement. EXAM: PORTABLE CHEST 1 VIEW COMPARISON:  CT same day. Chest radiography yesterday. FINDINGS: Power port inserted from a right internal jugular approach. Tip at the SVC RA junction. No pneumothorax. Mild bibasilar atelectasis. Small amount of pneumoperitoneum as shown on the previous CT consistent with yesterday's procedure. IMPRESSION: Mild bibasilar atelectasis. Power port tip at the SVC RA junction. No pneumothorax. Small amount of pneumoperitoneum as shown on the previous CT. Electronically Signed   By: Nelson Chimes M.D.   On: 12/08/2019 16:48   DG CHEST PORT 1 VIEW  Result Date: 12/07/2019 CLINICAL DATA:  Status post Port-A-Cath placement. EXAM: PORTABLE CHEST 1 VIEW COMPARISON:  October 05, 2019. FINDINGS: Stable cardiomediastinal silhouette. No pneumothorax pleural effusion is noted. Mild bibasilar subsegmental atelectasis or  scarring is noted. Interval placement of right internal jugular Port-A-Cath with distal tip in expected position of SVC. Atherosclerosis of thoracic aorta is noted. Bony thorax is unremarkable. IMPRESSION: Interval placement of right internal jugular Port-A-Cath with distal tip in expected position of the SVC. No pneumothorax is noted. Electronically  Signed   By: Marijo Conception M.D.   On: 12/07/2019 10:28   DG C-Arm 1-60 Min-No Report  Result Date: 12/07/2019 Fluoroscopy was utilized by the requesting physician.  No radiographic interpretation.   ECHOCARDIOGRAM COMPLETE  Result Date: 12/09/2019    ECHOCARDIOGRAM REPORT   Patient Name:   JOSHALYNN RIVKIN [facility] Date of Exam: 12/09/2019 Medical Rec #:  [id number]     Height:       64.0 in Accession #:    [id number]    Weight:       158.8 lb Date of Birth:  [birth year]     BSA:          1.774 m Patient Age:    89 years      BP:           142/78 mmHg Patient Gender: F             HR:           62 bpm. Exam Location:  [facility] Procedure: 2D Echo, Cardiac Doppler and Color Doppler Indications:    Atrial Fibrillation 427.31  History:        Patient has no prior history of Echocardiogram examinations.                 COPD.  Sonographer:    Sherrie Sport RDCS (AE) Referring Phys: [id number] [facility] Diagnosing      Serafina Royals MD Phys: IMPRESSIONS  1. Left ventricular ejection fraction, by estimation, is 50 to 55%. The left ventricle has low normal function. The left ventricle has no regional wall motion abnormalities. Left ventricular diastolic parameters were normal.  2. Right ventricular systolic function is normal. The right ventricular size is normal.  3. The mitral valve is normal in structure. Trivial mitral valve regurgitation.  4. The aortic valve is normal in structure. Aortic valve regurgitation is not visualized. FINDINGS  Left Ventricle: Left ventricular ejection fraction, by estimation, is 50 to 55%. The left ventricle has low normal function. The left ventricle has no regional wall motion abnormalities. The left ventricular internal cavity size was normal in size. There is no left ventricular hypertrophy. Left ventricular diastolic parameters were normal. Right Ventricle: The right ventricular size is normal. No increase in right ventricular wall thickness. Right ventricular systolic function is normal. Left Atrium:  Left atrial size was normal in size. Right Atrium: Right atrial size was normal in size. Pericardium: There is no evidence of pericardial effusion. Mitral Valve: The mitral valve is normal in structure. Trivial mitral valve regurgitation. Tricuspid Valve: The tricuspid valve is normal in structure. Tricuspid valve regurgitation is trivial. Aortic Valve: The aortic valve is normal in structure. Aortic valve regurgitation is not visualized. Aortic valve mean gradient measures 3.5 mmHg. Aortic valve peak gradient measures 6.0 mmHg. Aortic valve area, by VTI measures 2.29 cm. Pulmonic Valve: The pulmonic valve was normal in structure. Pulmonic valve regurgitation is not visualized. Aorta: The aortic root and ascending aorta are structurally normal, with no evidence of dilitation. IAS/Shunts: No atrial level shunt detected by color flow Doppler.  LEFT VENTRICLE PLAX 2D LVIDd:         4.08 cm  Diastology  LVIDs:         2.85 cm  LV e' lateral:   9.36 cm/s LV PW:         1.18 cm  LV E/e' lateral: 11.5 LV IVS:        0.92 cm  LV e' medial:    8.81 cm/s LVOT diam:     2.00 cm  LV E/e' medial:  12.3 LV SV:         56 LV SV Index:   32 LVOT Area:     3.14 cm  RIGHT VENTRICLE RV Basal diam:  4.02 cm LEFT ATRIUM             Index       RIGHT ATRIUM           Index LA diam:        4.40 cm 2.48 cm/m  RA Area:     21.80 cm LA Vol (A2C):   81.2 ml 45.78 ml/m RA Volume:   68.60 ml  38.68 ml/m LA Vol (A4C):   71.0 ml 40.03 ml/m LA Biplane Vol: 75.0 ml 42.29 ml/m  AORTIC VALVE                   PULMONIC VALVE AV Area (Vmax):    2.21 cm    PV Vmax:        0.78 m/s AV Area (Vmean):   2.10 cm    PV Peak grad:   2.4 mmHg AV Area (VTI):     2.29 cm    RVOT Peak grad: 3 mmHg AV Vmax:           122.00 cm/s AV Vmean:          83.250 cm/s AV VTI:            0.244 m AV Peak Grad:      6.0 mmHg AV Mean Grad:      3.5 mmHg LVOT Vmax:         85.70 cm/s LVOT Vmean:        55.700 cm/s LVOT VTI:          0.178 m LVOT/AV VTI ratio: 0.73   AORTA Ao Root diam: 2.60 cm MITRAL VALVE MV Area (PHT): 3.37 cm     SHUNTS MV Decel Time: 225 msec     Systemic VTI:  0.18 m MV E velocity: 108.00 cm/s  Systemic Diam: 2.00 cm MV A velocity: 62.10 cm/s MV E/A ratio:  1.74 Serafina Royals MD Electronically signed by Serafina Royals MD Signature Date/Time: 12/09/2019/1:47:20 PM    Final     (Echo, Carotid, EGD, Colonoscopy, ERCP)    Subjective: Patient seen and examined In sinus rhythm, no chest pain no shortness of breath Stable for discharge home  Discharge Exam: Vitals:   12/10/19 0939 12/10/19 0954  BP:    Pulse: 71 74  Resp:    Temp:    SpO2:  92%   Vitals:   12/10/19 0450 12/10/19 0817 12/10/19 0939 12/10/19 0954  BP: (!) 113/57 135/73    Pulse: (!) 58 (!) 58 71 74  Resp: 20 16    Temp: 98.1 F (36.7 C) 97.6 F (36.4 C)    TempSrc: Oral Oral    SpO2: 93% 94%  92%  Weight: 71.5 kg     Height:        General: Pt is alert, awake, not in acute distress Cardiovascular: RRR, S1/S2 +, no rubs, no gallops Respiratory: CTA  bilaterally, no wheezing, no rhonchi Abdominal: Soft, NT, ND, bowel sounds + Extremities: no edema, no cyanosis    The results of significant diagnostics from this hospitalization (including imaging, microbiology, ancillary and laboratory) are listed below for reference.     Microbiology: Recent Results (from the past 240 hour(s))  SARS CORONAVIRUS 2 (TAT 6-24 HRS) Nasopharyngeal Nasopharyngeal Swab     Status: None   Collection Time: 12/03/19 11:31 AM   Specimen: Nasopharyngeal Swab  Result Value Ref Range Status   SARS Coronavirus 2 NEGATIVE NEGATIVE Final    Comment: (NOTE) SARS-CoV-2 target nucleic acids are NOT DETECTED. The SARS-CoV-2 RNA is generally detectable in upper and lower respiratory specimens during the acute phase of infection. Negative results do not preclude SARS-CoV-2 infection, do not rule out co-infections with other pathogens, and should not be used as the sole basis for  treatment or other patient management decisions. Negative results must be combined with clinical observations, patient history, and epidemiological information. The expected result is Negative. Fact Sheet for Patients: SugarRoll.be Fact Sheet for Healthcare Providers: https://www.woods-mathews.com/ This test is not yet approved or cleared by the [place] FDA and  has been authorized for detection and/or diagnosis of SARS-CoV-2 by FDA under an Emergency Use Authorization (EUA). This EUA will remain  in effect (meaning this test can be used) for the duration of the COVID-19 declaration under Section 56 4(b)(1) of the Act, 21 U.S.C. section 360bbb-3(b)(1), unless the authorization is terminated or revoked sooner. Performed at [facility], [place] [address], [place], [place] [address]   SARS CORONAVIRUS 2 (TAT 6-24 HRS) Nasopharyngeal Nasopharyngeal Swab     Status: None   Collection Time: 12/08/19  6:50 PM   Specimen: Nasopharyngeal Swab  Result Value Ref Range Status   SARS Coronavirus 2 NEGATIVE NEGATIVE Final    Comment: (NOTE) SARS-CoV-2 target nucleic acids are NOT DETECTED. The SARS-CoV-2 RNA is generally detectable in upper and lower respiratory specimens during the acute phase of infection. Negative results do not preclude SARS-CoV-2 infection, do not rule out co-infections with other pathogens, and should not be used as the sole basis for treatment or other patient management decisions. Negative results must be combined with clinical observations, patient history, and epidemiological information. The expected result is Negative. Fact Sheet for Patients: SugarRoll.be Fact Sheet for Healthcare Providers: https://www.woods-mathews.com/ This test is not yet approved or cleared by the [place] FDA and  has been authorized for detection and/or diagnosis of SARS-CoV-2 by FDA under an  Emergency Use Authorization (EUA). This EUA will remain  in effect (meaning this test can be used) for the duration of the COVID-19 declaration under Section 56 4(b)(1) of the Act, 21 U.S.C. section 360bbb-3(b)(1), unless the authorization is terminated or revoked sooner. Performed at [facility], [place] [address] [place], [place] [address]      Labs: BNP (last 3 results) Recent Labs    12/08/19 1618  BNP [id number]*   Basic Metabolic Panel: Recent Labs  Lab 12/08/19 1606 12/08/19 1617 12/09/19 0415 12/10/19 0511  NA  --  136 138 138  K  --  4.2 4.0 4.1  CL  --  101 100 98  CO2  --  25 30 32  GLUCOSE  --  137* 99 97  BUN  --  14 13 17   CREATININE  --  0.62 0.57 0.73  CALCIUM  --  8.8* 8.3* 8.7*  MG 2.0  --   --  2.1  Liver Function Tests: Recent Labs  Lab 12/08/19 1617  AST 17  ALT 9  ALKPHOS 73  BILITOT 0.6  PROT 7.9  ALBUMIN 3.3*   No results for input(s): LIPASE, AMYLASE in the last 168 hours. No results for input(s): AMMONIA in the last 168 hours. CBC: Recent Labs  Lab 12/08/19 1617 12/09/19 0415 12/10/19 0511  WBC 14.9* 9.6 10.6*  NEUTROABS  --   --  7.3  HGB 11.7* 9.8* 10.6*  HCT 37.4 32.7* 34.6*  MCV 85.4 87.7 86.5  PLT 618* 488* 507*   Cardiac Enzymes: No results for input(s): CKTOTAL, CKMB, CKMBINDEX, TROPONINI in the last 168 hours. BNP: Invalid input(s): POCBNP CBG: No results for input(s): GLUCAP in the last 168 hours. D-Dimer No results for input(s): DDIMER in the last 72 hours. Hgb A1c No results for input(s): HGBA1C in the last 72 hours. Lipid Profile No results for input(s): CHOL, HDL, LDLCALC, TRIG, CHOLHDL, LDLDIRECT in the last 72 hours. Thyroid function studies Recent Labs    12/08/19 1929  TSH 2.217   Anemia work up No results for input(s): VITAMINB12, FOLATE, FERRITIN, TIBC, IRON, RETICCTPCT in the last 72 hours. Urinalysis    Component Value Date/Time   COLORURINE AMBER (A) 11/29/2019 1340   APPEARANCEUR HAZY  (A) 11/29/2019 1340   APPEARANCEUR Hazy 01/26/2012 0108   LABSPEC 1.029 11/29/2019 1340   LABSPEC 1.021 01/26/2012 0108   PHURINE 5.0 11/29/2019 1340   GLUCOSEU NEGATIVE 11/29/2019 1340   GLUCOSEU Negative 01/26/2012 0108   HGBUR NEGATIVE 11/29/2019 1340   BILIRUBINUR SMALL (A) 11/29/2019 1340   BILIRUBINUR Negative 01/26/2012 0108   KETONESUR NEGATIVE 11/29/2019 1340   PROTEINUR 30 (A) 11/29/2019 1340   NITRITE NEGATIVE 11/29/2019 1340   LEUKOCYTESUR NEGATIVE 11/29/2019 1340   LEUKOCYTESUR Negative 01/26/2012 0108   Sepsis Labs Invalid input(s): PROCALCITONIN,  WBC,  LACTICIDVEN Microbiology Recent Results (from the past 240 hour(s))  SARS CORONAVIRUS 2 (TAT 6-24 HRS) Nasopharyngeal Nasopharyngeal Swab     Status: None   Collection Time: 12/03/19 11:31 AM   Specimen: Nasopharyngeal Swab  Result Value Ref Range Status   SARS Coronavirus 2 NEGATIVE NEGATIVE Final    Comment: (NOTE) SARS-CoV-2 target nucleic acids are NOT DETECTED. The SARS-CoV-2 RNA is generally detectable in upper and lower respiratory specimens during the acute phase of infection. Negative results do not preclude SARS-CoV-2 infection, do not rule out co-infections with other pathogens, and should not be used as the sole basis for treatment or other patient management decisions. Negative results must be combined with clinical observations, patient history, and epidemiological information. The expected result is Negative. Fact Sheet for Patients: SugarRoll.be Fact Sheet for Healthcare Providers: https://www.woods-mathews.com/ This test is not yet approved or cleared by the [place] FDA and  has been authorized for detection and/or diagnosis of SARS-CoV-2 by FDA under an Emergency Use Authorization (EUA). This EUA will remain  in effect (meaning this test can be used) for the duration of the COVID-19 declaration under Section 56 4(b)(1) of the Act, 21  U.S.C. section 360bbb-3(b)(1), unless the authorization is terminated or revoked sooner. Performed at [facility], [place] [address], [place], [place] [address]   SARS CORONAVIRUS 2 (TAT 6-24 HRS) Nasopharyngeal Nasopharyngeal Swab     Status: None   Collection Time: 12/08/19  6:50 PM   Specimen: Nasopharyngeal Swab  Result Value Ref Range Status   SARS Coronavirus 2 NEGATIVE NEGATIVE Final    Comment: (NOTE) SARS-CoV-2 target nucleic acids are NOT DETECTED.  The SARS-CoV-2 RNA is generally detectable in upper and lower respiratory specimens during the acute phase of infection. Negative results do not preclude SARS-CoV-2 infection, do not rule out co-infections with other pathogens, and should not be used as the sole basis for treatment or other patient management decisions. Negative results must be combined with clinical observations, patient history, and epidemiological information. The expected result is Negative. Fact Sheet for Patients: SugarRoll.be Fact Sheet for Healthcare Providers: https://www.woods-mathews.com/ This test is not yet approved or cleared by the [place] FDA and  has been authorized for detection and/or diagnosis of SARS-CoV-2 by FDA under an Emergency Use Authorization (EUA). This EUA will remain  in effect (meaning this test can be used) for the duration of the COVID-19 declaration under Section 56 4(b)(1) of the Act, 21 U.S.C. section 360bbb-3(b)(1), unless the authorization is terminated or revoked sooner. Performed at [facility], [place] [address] [place],  [address]      Time coordinating discharge: Over 30 minutes  SIGNED:   Sidney Ace, MD  [facility] 12/10/2019, 4:05 PM Pager   If 7PM-7AM, please contact night-coverage

## 2019-12-10 NOTE — Care Management Important Message (Signed)
Important Message  Patient Details  Name: Kimberly [name] MRN: [id number] Date of Birth: March 08, 1946   Medicare Important Message Given:  Yes     Dannette Barbara 12/10/2019, 12:54 PM

## 2019-12-10 NOTE — Plan of Care (Signed)
  Problem: Education: Goal: Knowledge of disease or condition will improve Outcome: Adequate for Discharge Goal: Understanding of medication regimen will improve Outcome: Adequate for Discharge Goal: Individualized Educational Video(s) Outcome: Adequate for Discharge   Problem: Activity: Goal: Ability to tolerate increased activity will improve Outcome: Adequate for Discharge   Problem: Cardiac: Goal: Ability to achieve and maintain adequate cardiopulmonary perfusion will improve Outcome: Adequate for Discharge   Problem: Health Behavior/Discharge Planning: Goal: Ability to safely manage health-related needs after discharge will improve Outcome: Adequate for Discharge   Problem: Education: Goal: Knowledge of General Education information will improve Description: Including pain rating scale, medication(s)/side effects and non-pharmacologic comfort measures Outcome: Adequate for Discharge   Problem: Health Behavior/Discharge Planning: Goal: Ability to manage health-related needs will improve Outcome: Adequate for Discharge   Problem: Clinical Measurements: Goal: Ability to maintain clinical measurements within normal limits will improve Outcome: Adequate for Discharge Goal: Will remain free from infection Outcome: Adequate for Discharge Goal: Diagnostic test results will improve Outcome: Adequate for Discharge Goal: Respiratory complications will improve Outcome: Adequate for Discharge Goal: Cardiovascular complication will be avoided Outcome: Adequate for Discharge   Problem: Activity: Goal: Risk for activity intolerance will decrease Outcome: Adequate for Discharge   Problem: Nutrition: Goal: Adequate nutrition will be maintained Outcome: Adequate for Discharge   Problem: Coping: Goal: Level of anxiety will decrease Outcome: Adequate for Discharge   Problem: Elimination: Goal: Will not experience complications related to bowel motility Outcome: Adequate for  Discharge Goal: Will not experience complications related to urinary retention Outcome: Adequate for Discharge   Problem: Pain Managment: Goal: General experience of comfort will improve Outcome: Adequate for Discharge   Problem: Safety: Goal: Ability to remain free from injury will improve Outcome: Adequate for Discharge   Problem: Skin Integrity: Goal: Risk for impaired skin integrity will decrease Outcome: Adequate for Discharge   Problem: Education: Goal: Knowledge of General Education information will improve Description: Including pain rating scale, medication(s)/side effects and non-pharmacologic comfort measures Outcome: Adequate for Discharge   Problem: Health Behavior/Discharge Planning: Goal: Ability to manage health-related needs will improve Outcome: Adequate for Discharge   Problem: Clinical Measurements: Goal: Ability to maintain clinical measurements within normal limits will improve Outcome: Adequate for Discharge Goal: Will remain free from infection Outcome: Adequate for Discharge Goal: Diagnostic test results will improve Outcome: Adequate for Discharge Goal: Respiratory complications will improve Outcome: Adequate for Discharge Goal: Cardiovascular complication will be avoided Outcome: Adequate for Discharge   Problem: Activity: Goal: Risk for activity intolerance will decrease Outcome: Adequate for Discharge   Problem: Nutrition: Goal: Adequate nutrition will be maintained Outcome: Adequate for Discharge   Problem: Coping: Goal: Level of anxiety will decrease Outcome: Adequate for Discharge   Problem: Elimination: Goal: Will not experience complications related to bowel motility Outcome: Adequate for Discharge Goal: Will not experience complications related to urinary retention Outcome: Adequate for Discharge   Problem: Pain Managment: Goal: General experience of comfort will improve Outcome: Adequate for Discharge   Problem:  Safety: Goal: Ability to remain free from injury will improve Outcome: Adequate for Discharge   Problem: Skin Integrity: Goal: Risk for impaired skin integrity will decrease Outcome: Adequate for Discharge

## 2019-12-10 NOTE — Plan of Care (Signed)
  Problem: Pain Managment: Goal: General experience of comfort will improve Outcome: Progressing  Patients voices complaints of pain to nurse. Patient recognizes the importance of staying on top of pain to avoid increased pain and discomfort.

## 2019-12-12 DIAGNOSIS — R062 Wheezing: Secondary | ICD-10-CM | POA: Diagnosis not present

## 2019-12-12 DIAGNOSIS — J441 Chronic obstructive pulmonary disease with (acute) exacerbation: Secondary | ICD-10-CM | POA: Diagnosis not present

## 2019-12-12 DIAGNOSIS — S72046A Nondisplaced fracture of base of neck of unspecified femur, initial encounter for closed fracture: Secondary | ICD-10-CM | POA: Diagnosis not present

## 2019-12-13 ENCOUNTER — Telehealth: Payer: Self-pay

## 2019-12-13 NOTE — Telephone Encounter (Signed)
Received a  message about patient's daughter wanting to know why her mother's appt was moved up from Friday.    Discussed with Dr. Tasia Catchings and she wanted her moved up to discuss chemo plan ASAP.  Called Debra back and explained Dr. Collie Siad reasoning behind seeing her mother tomorrow instead of Friday.  Dr. Tasia Catchings can call her and have her on speaker phone so she could be involved with the appt, if she is not able to attend with her mother.

## 2019-12-13 NOTE — Telephone Encounter (Signed)
[place] from MD:  please move her appt up. she was admitted and discharged. thanks.   Patient was scheduled for 3/16 MD @ 1:00 and [place] @ 1:30.     Ellison Hughs tried calling pt but no answer.  A detailed message was left on her voicemail.

## 2019-12-14 ENCOUNTER — Inpatient Hospital Stay (HOSPITAL_BASED_OUTPATIENT_CLINIC_OR_DEPARTMENT_OTHER): Payer: Medicare HMO | Admitting: Oncology

## 2019-12-14 ENCOUNTER — Telehealth: Payer: Self-pay | Admitting: *Deleted

## 2019-12-14 ENCOUNTER — Inpatient Hospital Stay: Payer: Medicare HMO

## 2019-12-14 ENCOUNTER — Encounter: Payer: Self-pay | Admitting: Oncology

## 2019-12-14 VITALS — BP 138/75 | HR 67 | Temp 96.9°F | Resp 18 | Wt 155.4 lb

## 2019-12-14 DIAGNOSIS — Z853 Personal history of malignant neoplasm of breast: Secondary | ICD-10-CM | POA: Diagnosis not present

## 2019-12-14 DIAGNOSIS — K5903 Drug induced constipation: Secondary | ICD-10-CM | POA: Diagnosis not present

## 2019-12-14 DIAGNOSIS — D500 Iron deficiency anemia secondary to blood loss (chronic): Secondary | ICD-10-CM

## 2019-12-14 DIAGNOSIS — M7989 Other specified soft tissue disorders: Secondary | ICD-10-CM | POA: Diagnosis not present

## 2019-12-14 DIAGNOSIS — R590 Localized enlarged lymph nodes: Secondary | ICD-10-CM | POA: Diagnosis not present

## 2019-12-14 DIAGNOSIS — Z86010 Personal history of colonic polyps: Secondary | ICD-10-CM | POA: Diagnosis not present

## 2019-12-14 DIAGNOSIS — Z7901 Long term (current) use of anticoagulants: Secondary | ICD-10-CM | POA: Diagnosis not present

## 2019-12-14 DIAGNOSIS — I4891 Unspecified atrial fibrillation: Secondary | ICD-10-CM | POA: Diagnosis not present

## 2019-12-14 DIAGNOSIS — Z923 Personal history of irradiation: Secondary | ICD-10-CM | POA: Diagnosis not present

## 2019-12-14 DIAGNOSIS — R110 Nausea: Secondary | ICD-10-CM | POA: Diagnosis not present

## 2019-12-14 DIAGNOSIS — I700 Atherosclerosis of aorta: Secondary | ICD-10-CM | POA: Diagnosis not present

## 2019-12-14 DIAGNOSIS — Z7951 Long term (current) use of inhaled steroids: Secondary | ICD-10-CM | POA: Diagnosis not present

## 2019-12-14 DIAGNOSIS — R634 Abnormal weight loss: Secondary | ICD-10-CM | POA: Diagnosis not present

## 2019-12-14 DIAGNOSIS — C184 Malignant neoplasm of transverse colon: Secondary | ICD-10-CM

## 2019-12-14 DIAGNOSIS — Z888 Allergy status to other drugs, medicaments and biological substances status: Secondary | ICD-10-CM | POA: Diagnosis not present

## 2019-12-14 DIAGNOSIS — E279 Disorder of adrenal gland, unspecified: Secondary | ICD-10-CM | POA: Diagnosis not present

## 2019-12-14 DIAGNOSIS — D72829 Elevated white blood cell count, unspecified: Secondary | ICD-10-CM | POA: Diagnosis not present

## 2019-12-14 DIAGNOSIS — Z7189 Other specified counseling: Secondary | ICD-10-CM | POA: Diagnosis not present

## 2019-12-14 DIAGNOSIS — G629 Polyneuropathy, unspecified: Secondary | ICD-10-CM | POA: Diagnosis not present

## 2019-12-14 DIAGNOSIS — R5383 Other fatigue: Secondary | ICD-10-CM | POA: Diagnosis not present

## 2019-12-14 DIAGNOSIS — Z79899 Other long term (current) drug therapy: Secondary | ICD-10-CM | POA: Diagnosis not present

## 2019-12-14 DIAGNOSIS — Z5112 Encounter for antineoplastic immunotherapy: Secondary | ICD-10-CM | POA: Diagnosis not present

## 2019-12-14 DIAGNOSIS — D509 Iron deficiency anemia, unspecified: Secondary | ICD-10-CM | POA: Diagnosis not present

## 2019-12-14 DIAGNOSIS — Z87891 Personal history of nicotine dependence: Secondary | ICD-10-CM | POA: Diagnosis not present

## 2019-12-14 DIAGNOSIS — C786 Secondary malignant neoplasm of retroperitoneum and peritoneum: Secondary | ICD-10-CM | POA: Diagnosis not present

## 2019-12-14 DIAGNOSIS — G893 Neoplasm related pain (acute) (chronic): Secondary | ICD-10-CM | POA: Diagnosis not present

## 2019-12-14 MED ORDER — SODIUM CHLORIDE 0.9% FLUSH
10.0000 mL | INTRAVENOUS | Status: DC | PRN
  Administered 2019-12-14: 10 mL
  Filled 2019-12-14: qty 10

## 2019-12-14 MED ORDER — SODIUM CHLORIDE 0.9 % IV SOLN
INTRAVENOUS | Status: DC
  Filled 2019-12-14: qty 250

## 2019-12-14 MED ORDER — HEPARIN SOD (PORK) LOCK FLUSH 100 UNIT/ML IV SOLN
500.0000 [IU] | Freq: Once | INTRAVENOUS | Status: AC | PRN
  Administered 2019-12-14: 500 [IU]
  Filled 2019-12-14: qty 5

## 2019-12-14 MED ORDER — IRON SUCROSE 20 MG/ML IV SOLN
200.0000 mg | Freq: Once | INTRAVENOUS | Status: AC
  Administered 2019-12-14: 14:00:00 200 mg via INTRAVENOUS
  Filled 2019-12-14: qty 10

## 2019-12-14 MED ORDER — HEPARIN SOD (PORK) LOCK FLUSH 100 UNIT/ML IV SOLN
INTRAVENOUS | Status: AC
  Filled 2019-12-14: qty 5

## 2019-12-14 NOTE — Telephone Encounter (Signed)
Daughter called asking for an estimated life expectancy knowing that nothing is certain.

## 2019-12-15 ENCOUNTER — Telehealth: Payer: Self-pay | Admitting: Pharmacy Technician

## 2019-12-15 ENCOUNTER — Telehealth: Payer: Self-pay | Admitting: Pharmacist

## 2019-12-15 DIAGNOSIS — C184 Malignant neoplasm of transverse colon: Secondary | ICD-10-CM

## 2019-12-15 MED ORDER — LIDOCAINE-PRILOCAINE 2.5-2.5 % EX CREA: TOPICAL_CREAM | CUTANEOUS | 3 refills | Status: DC

## 2019-12-15 MED ORDER — ENCORAFENIB 75 MG PO CAPS: 300.0000 mg | ORAL_CAPSULE | Freq: Every day | ORAL | 11 refills | Status: DC

## 2019-12-15 MED ORDER — ONDANSETRON HCL 8 MG PO TABS: 8.0000 mg | ORAL_TABLET | Freq: Two times a day (BID) | ORAL | 11 refills | Status: DC | PRN

## 2019-12-15 NOTE — Progress Notes (Signed)
DISCONTINUE ON PATHWAY REGIMEN - Colorectal     A cycle is every 14 days:     Bevacizumab-xxxx      Irinotecan      Leucovorin      Fluorouracil      Fluorouracil   **Always confirm dose/schedule in your pharmacy ordering system**  REASON: Disease Progression PRIOR TREATMENT: [id number]: FOLFIRI + Bevacizumab q14 Days TREATMENT RESPONSE: Progressive Disease (PD)  START ON PATHWAY REGIMEN - Colorectal     Cycle 1: A cycle is 28 days:     Encorafenib      Cetuximab      Cetuximab    Cycles 2 and beyond: A cycle is every 28 days:     Encorafenib      Cetuximab   **Always confirm dose/schedule in your pharmacy ordering system**  Patient Characteristics: Distant Metastases, Nonsurgical Candidate, BRAF V600 Mutation Positive (KRAS/NRAS Wild-Type), Standard Cytotoxic/Targeted Therapy, Second Line Standard Cytotoxic/Targeted Therapy Tumor Location: Colon Therapeutic Status: Distant Metastases Microsatellite/Mismatch Repair Status: MSS/pMMR BRAF Mutation Status: Mutation Positive KRAS/NRAS Mutation Status: Wild-Type (no mutation) Standard Cytotoxic/Targeted Line of Therapy: Second Line Standard Cytotoxic/Targeted Therapy Intent of Therapy: Non-Curative / Palliative Intent, Discussed with Patient

## 2019-12-15 NOTE — Telephone Encounter (Signed)
In general stage IV colon cancer has a median survival time of about 6-18 months. 5 year survival rate of 14% I am not able to predict individual life expectancy.

## 2019-12-15 NOTE — Progress Notes (Signed)
[facility]  Visit:  Patient Care Team: Birdie Sons, MD as PCP - General (Family Medicine) Earlie Server, MD as Consulting Physician (Oncology) Noreene Filbert, MD as Referring Physician (Radiation Oncology) Jules Husbands, MD as Consulting Physician (General Surgery) Benedetto Goad, RN as Registered Nurse Cathi Roan, [facility] (Pharmacist) Clent Jacks, RN as Oncology Nurse Navigator  PURPOSE OF VISIT:  colon cancer  HISTORY OF PRESENTING ILLNESS: Kimberly [name] 74 y.o. female with PMH listed as below presents for follow up for the management of Stage IIIB Colon Cancer Patient reports a remote history of breast cancer s/p lumpectomy and radiation treatments.   Patient had been having abdominal pain, weight loss and bloating.  CT scan showed partial obstruction at the level of transverse colon and ileocolic mesenteric adenopathy. She was prepared for a colonoscopy on 04/15/2017. She started to have worsened abdominal pain, nausea vomiting, unable to maintain oral intake and was sent to ED. She was found to have bowel obstruction and underwent  exploratory laparotomy with transverse colectomy, with end colostomy and mucous fistula formation for obstructing colon lesion. Differential diagnosis prior to surgery was metastatic breast cancer in colon vs colon cancer. The patient did have a colonoscopy 2 years ago without the mass being present at that time but did have 2 small polyps in the transverse colon that were removed.  04/15/2017 Patient underwent transverse colon resection.  Pathology showed T4aN1 moderate differentiated adenocarcinoma, Grade 2, tumor invades visceral peritoneum, LVI present, negative margin, 3/16 lymph nodes involved with cancer. Low probability of MSI-H.   # Genetic test INVITAE came back negative. No pathogenetic sequence variants or deletions/duplications identified. Results was scanned to [employer] (a copy of the report was provided to  patient and her daughter)  # # baseline CEA is 2.1 # colostomy reversal on 06/25/2018, subsequently developed C. difficile colitis x 2.  Multiple abdominal CT abdomen and pelvis for obtained, not listed here #09/01/2018 CT abdomen pelvis showed abdominal wall abscess,  percutaneous drain placement .treatment with antibiotics with Augmentin 10 days course.  #09/14/2018 CT abdomen/pelvis showed resolution of abscess, no intraabdominal abscess.  Mediport was discontinued. # 08/13/2019 colonoscopy showed localized area of mildly thickened folds of the mucosa was found in the sigmoid colon. Biopsy pathology showed benign mucosa with lymphoid aggregates and focal minimal inflammation. No dysplasia.   TREATMENT:  04/15/2017 transverse colon resection 05/27/2017- 11/17/2017 adjuvant FOLFOX, Oxaliplatin was discontinued after 3 cycles due to worsening of pre-existing neuropathy. Finished another 9 cycles of 5-FU.  March -April 2019 adjuvant RT.  06/25/2018 Colostomy Reversal.   09/21/2019 PET scan showed 2 small right abdominal mesenteric soft tissue nodule which showed no significant change in size compared to recent CT but hypermetabolic.  Peritoneal metastatic disease cannot be excluded. 1.1 cm hypermetabolic nodule in the right parotid gland suspicious for primary parotid neoplasm.  08/27/2020, CT-guided biopsy of abdominal wall mass was obtained. Results showed metastatic adenocarcinoma, compatible with colon primary.  INTERVAL HISTORY 74 y.o. female  presents for follow-up of recurrent colon cancer. During the interval,Patient also underwent surgery for laparoscopic evaluation of peritoneum and placement of Mediport.  patient was noted to have heart rate of 1 40-1 60 and was sent to emergency room.  She was admitted due to atrial fibrillation.  Patient was started on anticoagulation with Eliquis 5 mg twice daily.  Patient was reverted back to normal sinus rhythm.  She is on metoprolol for rate  control. Today patient  reports feeling better.  Denies any shortness of breath, chest pain, palpitation. Abdominal pain has improved   Review of Systems  Constitutional: Positive for fatigue. Negative for appetite change, chills, fever and unexpected weight change.  HENT:   Negative for hearing loss, lump/mass, nosebleeds and voice change.   Eyes: Negative for eye problems.  Respiratory: Negative for chest tightness, cough and shortness of breath.   Cardiovascular: Negative for chest pain and leg swelling.  Gastrointestinal: Negative for abdominal distention, abdominal pain, blood in stool, constipation, diarrhea and nausea.  Endocrine: Negative for hot flashes.  Genitourinary: Negative for difficulty urinating, dysuria and frequency.   Musculoskeletal: Negative for arthralgias, gait problem and myalgias.  Skin: Negative for itching, rash and wound.  Neurological: Negative for dizziness, extremity weakness, gait problem and headaches.       Chronic numbness and tingling of fingertips and toes.  Hematological: Negative for adenopathy. Does not bruise/bleed easily.  Psychiatric/Behavioral: Negative for confusion, decreased concentration and depression. The patient is not nervous/anxious.       MEDICAL HISTORY: Past Medical History:  Diagnosis Date  . Breast cancer, left ([place]) 2004   Lumpectomy and rad tx's.  . Chronic back pain   . Colon cancer ([place]) 2018  . COPD (chronic obstructive pulmonary disease) ([place])   . Degenerative disc disease, lumbar 04/2013  . Family history of adverse reaction to anesthesia    son arrested after anesthesia  . GERD (gastroesophageal reflux disease)   . Iron deficiency anemia 05/27/2017  . Personal history of chemotherapy 2018   Colon  . Personal history of radiation therapy    Breast 2004  . Personal history of radiation therapy 2018   Colon  . Postprocedural intraabdominal abscess 09/01/2018  . Small bowel obstruction ([place]) 04/16/2017  . Wears  dentures    full upper    SURGICAL HISTORY: Past Surgical History:  Procedure Laterality Date  . APPENDECTOMY  1965  . BREAST EXCISIONAL BIOPSY Left 08/01/2003   lumpectomy rad 11/04-2/28/2005  . BREAST SURGERY    . CESAREAN SECTION     x3  . COLON SURGERY    . COLONOSCOPY W/ POLYPECTOMY    . COLONOSCOPY WITH PROPOFOL N/A 04/09/2018   Procedure: COLONOSCOPY WITH PROPOFOL;  Surgeon: Virgel Manifold, MD;  Location: [facility];  Service: Gastroenterology;  Laterality: N/A;  . COLONOSCOPY WITH PROPOFOL N/A 08/13/2019   Procedure: COLONOSCOPY WITH PROPOFOL;  Surgeon: Virgel Manifold, MD;  Location: [place];  Service: Endoscopy;  Laterality: N/A;  . COLOSTOMY TAKEDOWN N/A 06/25/2018   Procedure: COLOSTOMY TAKEDOWN;  Surgeon: Jules Husbands, MD;  Location: [facility];  Service: General;  Laterality: N/A;  . ESOPHAGOGASTRODUODENOSCOPY (EGD) WITH PROPOFOL N/A 04/04/2017   Procedure: ESOPHAGOGASTRODUODENOSCOPY (EGD) WITH PROPOFOL;  Surgeon: [name], MD;  Location: [place];  Service: Endoscopy;  Laterality: N/A;  . FRACTURE SURGERY    . HIP FRACTURE SURGERY Left 01/25/2012  . LAPAROSCOPY N/A 12/07/2019   Procedure: LAPAROSCOPY DIAGNOSTIC;  Surgeon: Jules Husbands, MD;  Location: [facility];  Service: General;  Laterality: N/A;  . LAPAROTOMY N/A 04/15/2017   Procedure: EXPLORATORY LAPAROTOMY;  Surgeon: Clayburn Pert, MD;  Location: [facility];  Service: General;  Laterality: N/A;  . NECK SURGERY  12/2011  . PARASTOMAL HERNIA REPAIR N/A 12/03/2017   Procedure: HERNIA REPAIR PARASTOMAL;  Surgeon: Jules Husbands, MD;  Location: [facility];  Service: General;  Laterality: N/A;  . PARASTOMAL HERNIA REPAIR N/A 06/25/2018   Procedure: HERNIA REPAIR PARASTOMAL;  Surgeon: Jules Husbands, MD;  Location: [facility];  Service: General;  Laterality: N/A;  . PORTACATH PLACEMENT Right 05/21/2017   Procedure: INSERTION PORT-A-CATH;  Surgeon: Clayburn Pert, MD;  Location: [facility];   Service: General;  Laterality: Right;  . PORTACATH PLACEMENT Right 12/07/2019   Procedure: INSERTION PORT-A-CATH;  Surgeon: Jules Husbands, MD;  Location: [facility];  Service: General;  Laterality: Right;  . WRIST FRACTURE SURGERY      SOCIAL HISTORY: Social History   Socioeconomic History  . Marital status: Divorced    Spouse name: Not on file  . Number of children: 3  . Years of education: Not on file  . Highest education level: 7th grade  Occupational History  . Occupation: retired  Tobacco Use  . Smoking status: Former Smoker    Packs/day: 0.25    Years: 55.00    Pack years: 13.75    Types: Cigarettes    Quit date: 05/21/2017    Years since quitting: 2.5  . Smokeless tobacco: Never Used  . Tobacco comment: started age 87 1/2 to 1 ppd  Substance and Sexual Activity  . Alcohol use: Yes    Alcohol/week: 0.0 standard drinks    Comment: occasionally drinks beer  . Drug use: No  . Sexual activity: Never  Other Topics Concern  . Not on file  Social History Narrative   Lives at home alone   Social Determinants of Health   Financial Resource Strain: Medium Risk  . Difficulty of Paying Living Expenses: Somewhat hard  Food Insecurity:   . Worried About Charity fundraiser in the Last Year:   . Arboriculturist in the Last Year:   Transportation Needs:   . Film/video editor (Medical):   Marland Kitchen Lack of Transportation (Non-Medical):   Physical Activity: Inactive  . Days of Exercise per Week: 0 days  . Minutes of Exercise per Session: 0 min  Stress:   . Feeling of Stress :   Social Connections: Unknown  . Frequency of Communication with Friends and Family: Patient refused  . Frequency of Social Gatherings with Friends and Family: Patient refused  . Attends Religious Services: Patient refused  . Active Member of Clubs or Organizations: Patient refused  . Attends Archivist Meetings: Patient refused  . Marital Status: Patient refused  Intimate Partner Violence:  Unknown  . Fear of Current or Ex-Partner: Patient refused  . Emotionally Abused: Patient refused  . Physically Abused: Patient refused  . Sexually Abused: Patient refused    FAMILY HISTORY Family History  Problem Relation Age of Onset  . Diabetes Mother        type 2  . Coronary artery disease Mother   . Deep vein thrombosis Mother   . Colon cancer Father   . Asthma Brother   . Diabetes Brother        type 2  . Breast cancer Neg Hx     ALLERGIES:  is allergic to betadine [povidone iodine]; iodine; other; and sinus formula [cholestatin].  MEDICATIONS:  Current Outpatient Medications  Medication Sig Dispense Refill  . apixaban (ELIQUIS) 5 MG TABS tablet Take 1 tablet (5 mg total) by mouth 2 (two) times daily. 60 tablet 0  . budesonide-formoterol (SYMBICORT) 80-4.5 MCG/ACT inhaler Inhale 2 puffs into the lungs 2 (two) times daily. 3 Inhaler 3  . calcium citrate-vitamin D (CITRACAL+D) 315-200 MG-UNIT tablet Take 1 tablet by mouth daily.    . Cholecalciferol (VITAMIN D3) 50 MCG (2000  UT) TABS Take 2,000 Units by mouth daily.    Marland Kitchen docusate sodium (COLACE) 100 MG capsule Take 100 mg by mouth daily.    Marland Kitchen gabapentin (NEURONTIN) 400 MG capsule TAKE 1 CAPSULE THREE TIMES DAILY (Patient taking differently: Take 600 mg by mouth 3 (three) times daily. ) 270 capsule 1  . HYDROcodone-acetaminophen (NORCO/VICODIN) 5-325 MG tablet Take 1-2 tablets by mouth every 6 (six) hours as needed for moderate pain. 20 tablet 0  . lidocaine-prilocaine (EMLA) cream Apply 1 application topically as needed. Apply to port and cover with saran wrap 1.5 hour prior to appointment 0.5 g 1  . meloxicam (MOBIC) 15 MG tablet TAKE 1 TABLET EVERY DAY AS NEEDED (Patient taking differently: Take 15 mg by mouth at bedtime. ) 90 tablet 3  . metoprolol tartrate (LOPRESSOR) 25 MG tablet Take 1 tablet (25 mg total) by mouth 2 (two) times daily. 60 tablet 0  . montelukast (SINGULAIR) 10 MG tablet TAKE 1 TABLET BY MOUTH EVERY DAY  (Patient taking differently: Take 10 mg by mouth daily. ) 90 tablet 0  . Multiple Vitamins-Minerals (WOMENS MULTI VITAMIN & MINERAL PO) Take 2 tablets by mouth daily.     Marland Kitchen omeprazole (PRILOSEC) 20 MG capsule Take 1 capsule (20 mg total) by mouth daily. 90 capsule 4  . oxycodone (OXY-IR) 5 MG capsule Take 2 capsules (10 mg total) by mouth every 4 (four) hours as needed. 30 capsule 0  . pravastatin (PRAVACHOL) 40 MG tablet TAKE 1 TABLET EVERY DAY (Patient taking differently: Take 40 mg by mouth daily. ) 90 tablet 4  . Probiotic Product (PROBIOTIC DAILY PO) Take 1 capsule by mouth daily.     . Triamcinolone Acetonide (NASACORT ALLERGY 24HR NA) Place 2 sprays into the nose daily as needed (allergies).     . encorafenib (BRAFTOVI) 75 MG capsule Take 4 capsules (300 mg total) by mouth daily. 120 capsule 11  . lidocaine-prilocaine (EMLA) cream Apply to affected area once 30 g 3  . ondansetron (ZOFRAN) 8 MG tablet Take 1 tablet (8 mg total) by mouth 2 (two) times daily as needed (Nausea or vomiting). May take 30-60 minutes prior to Braftovi if nausea/vomiting occurs. 30 tablet 11   No current facility-administered medications for this visit.    PHYSICAL EXAMINATION:  ECOG PERFORMANCE STATUS: 1 - Symptomatic but completely ambulatory  Vitals:   12/14/19 1318  BP: 138/75  Pulse: 67  Resp: 18  Temp: (!) 96.9 F (36.1 C)  SpO2: 96%   Filed Weights   12/14/19 1318  Weight: 155 lb 6.4 oz (70.5 kg)    Physical Exam  Constitutional: She is oriented to person, place, and time. No distress.  HENT:  Head: Normocephalic and atraumatic.  Mouth/Throat: No oropharyngeal exudate.  Eyes: Pupils are equal, round, and reactive to light. EOM are normal. No scleral icterus.  Cardiovascular: Normal rate and regular rhythm.  No murmur heard. Pulmonary/Chest: Effort normal. No respiratory distress. She has no rales. She exhibits no tenderness.  Abdominal: Soft. She exhibits no distension.  Palpable  abdominal wall mass,  Colostomy  Musculoskeletal:        General: No edema. Normal range of motion.     Cervical back: Normal range of motion and neck supple.  Neurological: She is alert and oriented to person, place, and time.  Skin: Skin is warm and dry. She is not diaphoretic. No erythema.  Left chest wall Mediport  Psychiatric: Affect normal.      LABORATORY DATA: I have  personally reviewed the data as listed: CBC Latest Ref Rng & Units 12/10/2019 12/09/2019 12/08/2019  WBC 4.0 - 10.5 K/uL 10.6(H) 9.6 14.9(H)  Hemoglobin 12.0 - 15.0 g/dL 10.6(L) 9.8(L) 11.7(L)  Hematocrit 36.0 - 46.0 % 34.6(L) 32.7(L) 37.4  Platelets 150 - 400 K/uL 507(H) 488(H) 618(H)   CMP Latest Ref Rng & Units 12/10/2019 12/09/2019 12/08/2019  Glucose 70 - 99 mg/dL 97 99 137(H)  BUN 8 - 23 mg/dL _0 Creatinine 0.44 - 1.00 mg/dL 0.73 0.57 0.62  Sodium 135 - 145 mmol/L 138 138 136  Potassium 3.5 - 5.1 mmol/L 4.1 4.0 4.2  Chloride 98 - 111 mmol/L 98 100 101  CO2 22 - 32 mmol/L 32 30 25  Calcium 8.9 - 10.3 mg/dL 8.7(L) 8.3(L) 8.8(L)  Total Protein 6.5 - 8.1 g/dL - - 7.9  Total Bilirubin 0.3 - 1.2 mg/dL - - 0.6  Alkaline Phos 38 - 126 U/L - - 73  AST 15 - 41 U/L - - 17  ALT 0 - 44 U/L - - 9   RADIOGRAPHIC STUDIES: I have personally reviewed the radiological images as listed and agreed with the findings in the report. CT ABDOMEN PELVIS WO CONTRAST  Addendum Date: 12/08/2019   ADDENDUM REPORT: 12/08/2019 16:58 ADDENDUM: Upon further review, 11 mm irregular nodular density is seen adjacent to the colonic surgical anastomosis, but may be slightly enlarged compared to prior exam. There is also noted 18 mm nodular density in anterior abdominal wall centrally which may be slightly enlarged compared to prior exam. Etiology of these is unknown, but possible malignancy or metastatic disease cannot be excluded. PET scan is recommended for further evaluation. Bilateral adrenal masses are noted most consistent with  adenomas. Mild pneumoperitoneum is noted consistent with recent surgery. Aortic Atherosclerosis (ICD10-I70.0). Electronically Signed   By: Marijo Conception M.D.   On: 12/08/2019 16:58   Result Date: 12/08/2019 CLINICAL DATA:  Acute generalized abdominal pain. EXAM: CT ABDOMEN AND PELVIS WITHOUT CONTRAST TECHNIQUE: Multidetector CT imaging of the abdomen and pelvis was performed following the standard protocol without IV contrast. COMPARISON:  June 21, 2019. FINDINGS: Lower chest: Minimal right posterior basilar subsegmental atelectasis is noted. Hepatobiliary: No focal liver abnormality is seen. No gallstones, gallbladder wall thickening, or biliary dilatation. Pancreas: Unremarkable. No pancreatic ductal dilatation or surrounding inflammatory changes. Spleen: Normal in size without focal abnormality. Adrenals/Urinary Tract: Stable bilateral adrenal masses are noted most consistent with adenomas. No hydronephrosis or renal obstruction is noted. No renal or ureteral calculi are noted. Urinary bladder is unremarkable. Stomach/Bowel: Status post right hemicolectomy. The stomach is unremarkable. There is no evidence of bowel obstruction or inflammation. Vascular/Lymphatic: Aortic atherosclerosis. No enlarged abdominal or pelvic lymph nodes. Reproductive: Uterus and bilateral adnexa are unremarkable. Other: Mild pneumoperitoneum is noted consistent with recent surgery. No ascites is noted. Musculoskeletal: No acute or significant osseous findings. Electronically Signed: By: Marijo Conception M.D. On: 12/08/2019 13:14   DG Ribs Unilateral Left  Result Date: 10/06/2019 CLINICAL DATA:  Left rib pain, marked with BB EXAM: LEFT RIBS - 2 VIEW COMPARISON:  Radiograph 09/01/2018, CT 05/20/2018 FINDINGS: Radiopaque BB marker projects over the lateral aspect of the tenth rib. There is a minimally displaced posterolateral left seventh rib fracture. No other evident rib fractures. No pneumothorax or effusion. Chronic  bronchitic and coarsened interstitial changes in the lungs are similar to prior. Portions of the right lung are collimated. The aorta is calcified. The remaining cardiomediastinal contours are unremarkable. IMPRESSION: 1.  Minimally displaced posterolateral left seventh rib fracture. 2. No pneumothorax or effusion. 3. Chronic bronchitic and coarsened interstitial changes in the lungs are similar to prior. 4.  Aortic Atherosclerosis (ICD10-I70.0). Electronically Signed   By: Lovena Le M.D.   On: 10/06/2019 05:08   NM Pulmonary Perf and Vent  Result Date: 12/09/2019 CLINICAL DATA:  Positive D-dimer study. History of breast and colon carcinoma. COPD EXAM: NUCLEAR MEDICINE VENTILATION - PERFUSION LUNG SCAN VIEWS: Anterior, posterior, left lateral, right lateral, RPO, RAO, LPO, LAO-ventilation and perfusion RADIOPHARMACEUTICALS:  30.52 mCi of Tc-86mDTPA aerosol inhalation and 3.86 mCi Tc927mAA IV COMPARISON:  Chest radiograph December 08, 2019 FINDINGS: Ventilation: Radiotracer uptake on the ventilation study is homogeneous and symmetric bilaterally. No appreciable ventilation defects. Perfusion: Radiotracer uptake on the perfusion study is homogeneous and symmetric bilaterally. No appreciable perfusion defects. IMPRESSION: No appreciable ventilation or perfusion defects. Very low probability of pulmonary embolus. Electronically Signed   By: WiLowella GripII M.D.   On: 12/09/2019 14:14   NM PET Image Restag (PS) Skull Base To Thigh  Result Date: 09/21/2019 CLINICAL DATA:  Subsequent treatment strategy for colon carcinoma. Rising CEA level. EXAM: NUCLEAR MEDICINE PET SKULL BASE TO THIGH TECHNIQUE: 8.7 mCi F-18 FDG was injected intravenously. Full-ring PET imaging was performed from the skull base to thigh after the radiotracer. CT data was obtained and used for attenuation correction and anatomic localization. Fasting blood glucose: 111 mg/dl COMPARISON:  Most recent chest CT on 03/03/2019 and AP CT on  06/01/2019 FINDINGS: Mediastinal blood-pool activity (background): SUV max = 2.3 Liver activity (reference): SUV max = N/A NECK: 1.1 cm hypermetabolic mass is seen in the right parotid gland. This has SUV max of 11.4, and is suspicious for primary parotid neoplasm. No hypermetabolic lymph nodes identified. Incidental CT findings:  None. CHEST: No hypermetabolic masses or lymphadenopathy. No suspicious pulmonary nodules seen on CT images. Incidental CT findings: Stable mild post radiation changes in left upper lobe. ABDOMEN/PELVIS: No abnormal hypermetabolic activity within the liver, pancreas, adrenal glands, or spleen. Bilateral lower attenuation adrenal masses are stable and show absence of hypermetabolic activity, consistent with benign adrenal adenomas. Postop changes are again seen from right hemicolectomy. A 1.0 cm irregular nodular density is again seen in the mesenteric fat adjacent to the enterocolonic anastomosis on image 192/3. This is not significant changed in size compared to prior studies, but does show hypermetabolic activity, with SUV max of 3.3. Another soft tissue nodule measuring 1.5 cm with a central focus of calcification is seen in the anterior right lower quadrant mesentery on image 209/3. This is also stable in size, but is hypermetabolic, with SUV max of 10.9. Incidental CT findings:  None. SKELETON: No focal hypermetabolic bone lesions to suggest skeletal metastasis. Incidental CT findings:  None. IMPRESSION: Two small right abdominal mesenteric soft tissue nodules show no significant change in size compared to recent CTs, but are hypermetabolic. Peritoneal metastatic disease cannot be excluded. No other definite sites of metastatic disease identified. 1.1 cm hypermetabolic nodule in the right parotid gland, suspicious for primary parotid neoplasm. Consider ENT consultation. Electronically Signed   By: JoMarlaine Hind.D.   On: 09/21/2019 16:41   DG Chest Port 1 View  Result Date:  12/08/2019 CLINICAL DATA:  Left lower quadrant abdominal pain. Surgical procedure yesterday. Recent port placement. EXAM: PORTABLE CHEST 1 VIEW COMPARISON:  CT same day. Chest radiography yesterday. FINDINGS: Power port inserted from a right internal jugular approach. Tip at the SVC RA junction.  No pneumothorax. Mild bibasilar atelectasis. Small amount of pneumoperitoneum as shown on the previous CT consistent with yesterday's procedure. IMPRESSION: Mild bibasilar atelectasis. Power port tip at the SVC RA junction. No pneumothorax. Small amount of pneumoperitoneum as shown on the previous CT. Electronically Signed   By: Nelson Chimes M.D.   On: 12/08/2019 16:48   DG CHEST PORT 1 VIEW  Result Date: 12/07/2019 CLINICAL DATA:  Status post Port-A-Cath placement. EXAM: PORTABLE CHEST 1 VIEW COMPARISON:  October 05, 2019. FINDINGS: Stable cardiomediastinal silhouette. No pneumothorax pleural effusion is noted. Mild bibasilar subsegmental atelectasis or scarring is noted. Interval placement of right internal jugular Port-A-Cath with distal tip in expected position of SVC. Atherosclerosis of thoracic aorta is noted. Bony thorax is unremarkable. IMPRESSION: Interval placement of right internal jugular Port-A-Cath with distal tip in expected position of the SVC. No pneumothorax is noted. Electronically Signed   By: Marijo Conception M.D.   On: 12/07/2019 10:28   DG C-Arm 1-60 Min-No Report  Result Date: 12/07/2019 Fluoroscopy was utilized by the requesting physician.  No radiographic interpretation.   ECHOCARDIOGRAM COMPLETE  Result Date: 12/09/2019    ECHOCARDIOGRAM REPORT   Patient Name:   JAHLIYAH TRICE [facility] Date of Exam: 12/09/2019 Medical Rec #:  [id number]     Height:       64.0 in Accession #:    [id number]    Weight:       158.8 lb Date of Birth:  04/01/1946     BSA:          1.774 m Patient Age:    17 years      BP:           142/78 mmHg Patient Gender: F             HR:           62 bpm. Exam Location:  [facility]  Procedure: 2D Echo, Cardiac Doppler and Color Doppler Indications:    Atrial Fibrillation 427.31  History:        Patient has no prior history of Echocardiogram examinations.                 COPD.  Sonographer:    Sherrie Sport RDCS (AE) Referring Phys: [id number] [facility] Diagnosing      Serafina Royals MD Phys: IMPRESSIONS  1. Left ventricular ejection fraction, by estimation, is 50 to 55%. The left ventricle has low normal function. The left ventricle has no regional wall motion abnormalities. Left ventricular diastolic parameters were normal.  2. Right ventricular systolic function is normal. The right ventricular size is normal.  3. The mitral valve is normal in structure. Trivial mitral valve regurgitation.  4. The aortic valve is normal in structure. Aortic valve regurgitation is not visualized. FINDINGS  Left Ventricle: Left ventricular ejection fraction, by estimation, is 50 to 55%. The left ventricle has low normal function. The left ventricle has no regional wall motion abnormalities. The left ventricular internal cavity size was normal in size. There is no left ventricular hypertrophy. Left ventricular diastolic parameters were normal. Right Ventricle: The right ventricular size is normal. No increase in right ventricular wall thickness. Right ventricular systolic function is normal. Left Atrium: Left atrial size was normal in size. Right Atrium: Right atrial size was normal in size. Pericardium: There is no evidence of pericardial effusion. Mitral Valve: The mitral valve is normal in structure. Trivial mitral valve regurgitation. Tricuspid Valve: The tricuspid valve is normal in structure. Tricuspid  valve regurgitation is trivial. Aortic Valve: The aortic valve is normal in structure. Aortic valve regurgitation is not visualized. Aortic valve mean gradient measures 3.5 mmHg. Aortic valve peak gradient measures 6.0 mmHg. Aortic valve area, by VTI measures 2.29 cm. Pulmonic Valve: The pulmonic valve was  normal in structure. Pulmonic valve regurgitation is not visualized. Aorta: The aortic root and ascending aorta are structurally normal, with no evidence of dilitation. IAS/Shunts: No atrial level shunt detected by color flow Doppler.  LEFT VENTRICLE PLAX 2D LVIDd:         4.08 cm  Diastology LVIDs:         2.85 cm  LV e' lateral:   9.36 cm/s LV PW:         1.18 cm  LV E/e' lateral: 11.5 LV IVS:        0.92 cm  LV e' medial:    8.81 cm/s LVOT diam:     2.00 cm  LV E/e' medial:  12.3 LV SV:         56 LV SV Index:   32 LVOT Area:     3.14 cm  RIGHT VENTRICLE RV Basal diam:  4.02 cm LEFT ATRIUM             Index       RIGHT ATRIUM           Index LA diam:        4.40 cm 2.48 cm/m  RA Area:     21.80 cm LA Vol (A2C):   81.2 ml 45.78 ml/m RA Volume:   68.60 ml  38.68 ml/m LA Vol (A4C):   71.0 ml 40.03 ml/m LA Biplane Vol: 75.0 ml 42.29 ml/m  AORTIC VALVE                   PULMONIC VALVE AV Area (Vmax):    2.21 cm    PV Vmax:        0.78 m/s AV Area (Vmean):   2.10 cm    PV Peak grad:   2.4 mmHg AV Area (VTI):     2.29 cm    RVOT Peak grad: 3 mmHg AV Vmax:           122.00 cm/s AV Vmean:          83.250 cm/s AV VTI:            0.244 m AV Peak Grad:      6.0 mmHg AV Mean Grad:      3.5 mmHg LVOT Vmax:         85.70 cm/s LVOT Vmean:        55.700 cm/s LVOT VTI:          0.178 m LVOT/AV VTI ratio: 0.73  AORTA Ao Root diam: 2.60 cm MITRAL VALVE MV Area (PHT): 3.37 cm     SHUNTS MV Decel Time: 225 msec     Systemic VTI:  0.18 m MV E velocity: 108.00 cm/s  Systemic Diam: 2.00 cm MV A velocity: 62.10 cm/s MV E/A ratio:  1.74 Serafina Royals MD Electronically signed by Serafina Royals MD Signature Date/Time: 12/09/2019/1:47:20 PM    Final    [place] CORE BIOPSY (SOFT TISSUE)  Result Date: 10/28/2019 CLINICAL DATA:  History of colon carcinoma and status post transverse colectomy and prior treatment with chemotherapy and radiation therapy. Recent imaging has demonstrated new omental, peritoneal and abdominal wall soft  tissue nodules which are hypermetabolic on PET scan and suspicious for  recurrent disease. EXAM: ULTRASOUND GUIDED CORE BIOPSY OF ABDOMINAL WALL MASS MEDICATIONS: 1.0 mg IV Versed; 50 mcg IV Fentanyl Total Moderate Sedation Time: 12 minutes. The patient's level of consciousness and physiologic status were continuously monitored during the procedure by Radiology nursing. PROCEDURE: The procedure, risks, benefits, and alternatives were explained to the patient. Questions regarding the procedure were encouraged and answered. The patient understands and consents to the procedure. A time out was performed prior to initiating the procedure. Ultrasound was performed of the abdominal wall and anterior peritoneal cavity. The anterior abdominal wall was prepped with chlorhexidine in a sterile fashion, and a sterile drape was applied covering the operative field. A sterile gown and sterile gloves were used for the procedure. Local anesthesia was provided with 1% Lidocaine. Core biopsy of an anterior abdominal wall mass was performed with an 18 gauge core biopsy device. Four separate core biopsy samples were obtained and submitted in formalin. COMPLICATIONS: None. FINDINGS: Irregular lobulated hypoechoic soft tissue mass is seen involving the right lower abdominal wall and corresponding to one of the lesions that demonstrated increased metabolic activity by PET scan. This area measures approximately 3.2 x 2.1 x 2.6 cm by ultrasound. Solid tissue was obtained with biopsy. IMPRESSION: Ultrasound-guided core biopsy performed of a right lower abdominal wall mass measuring 3 cm in estimated maximal diameter. Electronically Signed   By: Aletta Edouard M.D.   On: 10/28/2019 12:02      ASSESSMENT/PLAN 74yo female who has recurrent Colon Cancer presents for follow up  Cancer Staging Malignant neoplasm of transverse colon [facility]) Staging form: Colon and Rectum, AJCC 8th Edition - Clinical stage from 04/15/2017: Stage IIIB (cT4a,  cN1b, cM0) - Signed by Earlie Server, MD on 04/26/2017  1. Malignant neoplasm of transverse colon ([place])   2. Goals of care, counseling/discussion   3. Personal history of malignant neoplasm of breast    #History of stage IIIB colon cancer -03/2017 now with recurrent disease with abdominal wall and mesentery nodule.    Patient underwent laparoscopic evaluation of peritoneum and was found to have peritoneal metastasis clinically.  Biopsy results showed benign fibroadipose and fibromembranes tissue with fat necrosis and dense fibrous adhesions.  I discussed with Dr.Pabon and he feels that patient has clinical peritoneal metastasis and negative biopsy results likely due to necrosis.  Dr. Dahlia Byes feels that patient would not benefit from surgical resection given the extent of her disease.  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 I had a lengthy discussion with patient and her daughter today. Previously patient is very reluctant for cytoreductive chemotherapy with FOLFIRI/Avastin. Given that now we know that she has BRAFv600E mutation, I recommend cetuximab weekly plus Encorafenib 300 mg daily  side effects of Encorafenib  including but not limited to skin rash, ocular toxicity, QTC prolongation, hair loss, hyperglycemia, electrolyte imbalance, gastroenterology toxicities, low blood counts, liver toxicities, allergic reaction, etc, were discussed with patient. Side effects of cetuximab including but not limited to skin rash, neurological toxicities, gastroenterology toxicities, weight loss, low blood counts, liver toxicities, ophthalmology toxicities, etc. were discussed with patient Patient voices understanding and agrees with the plan.  Await insurance approval for starting of chemotherapy.   # History of breast cancer, need mammogram in March 2021 # Parotid nodule with hypermetabolism. Will refer to ENT in the future. Her evaluation and treatment of colon  caner takes priority.   # Leukocytosis, intermittent monocytosis, she declined bone marrow biopsy on 05/22/2018.  Continue  to monitor.  # IDA, proceed with Venofer 200 mg x 1 today.  Orders Placed This Encounter  Procedures  . CBC with Differential    Standing Status:   Standing    Number of Occurrences:   20    Standing Expiration Date:   12/15/2020  . Comprehensive metabolic panel    Standing Status:   Standing    Number of Occurrences:   20    Standing Expiration Date:   12/15/2020  . Magnesium    Standing Status:   Standing    Number of Occurrences:   20    Standing Expiration Date:   12/15/2020  . PHYSICIAN COMMUNICATION ORDER    Confirm the presence of BRAF V600E mutation in tumor specimens prior to the initiation of BRAFTOVI.  Marland Kitchen PHYSICIAN COMMUNICATION ORDER    QT Prolongation: Monitor electrolytes before and during treatment. Correct electrolyte abnormalities and control for cardiac risk factors for QT prolongation. Withhold BRAFTOVI for QTc of 500 ms or greater.  Marland Kitchen PHYSICIAN COMMUNICATION ORDER    Uveitis: Perform ophthalmologic evaluation at regular intervals and for any visual disturbances.  . PHYSICIAN COMMUNICATION ORDER    New Primary Malignancies, cutaneous and non-cutaneous: Can occur. Monitor for malignancies and perform dermatologic evaluations prior to, while on therapy, and following discontinuation of treatment.   follow up to be determined   Earlie Server, MD, PhD Hematology Oncology [facility] Pager- [phone] 12/15/19

## 2019-12-15 NOTE — Telephone Encounter (Signed)
Oral Oncology Patient Advocate Encounter   Received notification from [facility] that prior authorization for Braftovi is required.   PA submitted on CoverMyMeds Key [id number] Status is pending   Oral Oncology Clinic will continue to follow.  [place] Patient [place] Phone [phone] Fax [phone] 12/16/2019 9:38 AM

## 2019-12-15 NOTE — Telephone Encounter (Signed)
Oral Oncology Pharmacist Encounter  Received new prescription for Braftovi (encorafenib) for the treatment of recurrent colon cancer in conjunction with cetuximab, planned duration until disease progression or unacceptable drug toxicity. Previously treated with FOLFIRI and Avastin.  Prescription dose and frequency assessed.   Current medication list in Epic reviewed, no relevant DDIs with encorafenib identified.  Prescription has been e-scribed to the [facility] for benefits analysis and approval.  Oral Oncology Clinic will continue to follow for insurance authorization, copayment issues, initial counseling and start date.  Darl Pikes, PharmD, BCPS, BCOP, CPP Hematology/Oncology Clinical Pharmacist [facility] [phone]  12/15/2019 9:43 AM

## 2019-12-16 ENCOUNTER — Telehealth: Payer: Self-pay

## 2019-12-16 ENCOUNTER — Ambulatory Visit: Payer: Medicare HMO

## 2019-12-16 NOTE — Telephone Encounter (Signed)
-----   Message from Earlie Server, MD sent at 12/15/2019 11:03 PM EDT ----- I have asked pharmacist to look into coverage for Encorafenib.  Please schedule patient for appt next week lab MD [place].  Thank you  Talbert Cage

## 2019-12-16 NOTE — Telephone Encounter (Signed)
Pt is already scheduled for Venofer on 3/25 does that need to be cancelled?

## 2019-12-16 NOTE — Telephone Encounter (Signed)
Oral Chemotherapy Pharmacist Encounter  [facility] will deliver Braftovi to Ms. Puder on Monday 3/22 or Tuesday 3/23. She knows not to get started until Dr. Tasia Catchings gives her the go ahead to get started.  Patient Education I spoke with patient for overview of new oral chemotherapy medication: Braftovi (encorafenib) for the treatment of recurrent colon cancer in conjunction with cetuximab, planned duration until disease progression or unacceptable drug toxicity. Previously treated with FOLFIRI and Avastin.   Pt is doing well. Counseled patient on administration, dosing, side effects, monitoring, drug-food interactions, safe handling, storage, and disposal. Patient will take 4 capsules (300 mg total) by mouth daily.  Side effects include but not limited to: N/V, fatigue, decreased hgb, change in kidney/liver function.    Reviewed with patient importance of keeping a medication schedule and plan for any missed doses.  Ms. Feimster voiced understanding and appreciation. All questions answered. Medication handout placed in the mail.  Provided patient with Oral [facility] phone number. Patient knows to call the office with questions or concerns. Oral Chemotherapy Navigation Clinic will continue to follow.  Darl Pikes, PharmD, BCPS, BCOP, CPP Hematology/Oncology Clinical Pharmacist [facility] [phone]  12/16/2019 11:55 AM

## 2019-12-16 NOTE — Telephone Encounter (Signed)
Oral Oncology Patient Advocate Encounter  Prior Authorization for Kimberly [name] has been approved.    PA# [id number] Effective dates: 12/16/19 through 06/13/20  Patients co-pay is $9.20.  Oral Oncology Clinic will continue to follow.   [name] Patient [name] Phone [phone] Fax [phone] 12/16/2019 9:39 AM

## 2019-12-16 NOTE — Telephone Encounter (Signed)
Call returned to daughter and gave her doctor response. She thanked me for letting her know

## 2019-12-16 NOTE — Telephone Encounter (Signed)
Keep venofer as scheduled

## 2019-12-16 NOTE — Telephone Encounter (Signed)
Done.. Pt has been scheduled as requested for Venofer.Marland Kitchenadd lab/ MD/*NEW* Cetuximab per MD. Pt is aware of her sched 12/24/19 appts

## 2019-12-17 ENCOUNTER — Inpatient Hospital Stay: Payer: Medicare HMO | Admitting: Oncology

## 2019-12-17 ENCOUNTER — Inpatient Hospital Stay: Payer: Medicare HMO

## 2019-12-17 MED FILL — BRAFTOVI 75 MG CAPS: 75 | 30 days supply | Qty: 120 | Fill #0

## 2019-12-20 ENCOUNTER — Other Ambulatory Visit: Payer: Self-pay

## 2019-12-20 NOTE — Telephone Encounter (Signed)
Oral Oncology Patient Advocate Encounter  Medication was delivered by [employer] to patient on 12/18/19 from [facility].  [facility] will call 7-10 days before next refill is due to complete adherence call and set up delivery of medication.     [place] Patient [place] Phone [phone] Fax [phone] 12/20/2019 8:41 AM

## 2019-12-22 ENCOUNTER — Telehealth: Payer: Self-pay | Admitting: Family Medicine

## 2019-12-22 DIAGNOSIS — E782 Mixed hyperlipidemia: Secondary | ICD-10-CM | POA: Diagnosis not present

## 2019-12-22 DIAGNOSIS — I480 Paroxysmal atrial fibrillation: Secondary | ICD-10-CM | POA: Diagnosis not present

## 2019-12-22 DIAGNOSIS — I10 Essential (primary) hypertension: Secondary | ICD-10-CM | POA: Diagnosis not present

## 2019-12-22 NOTE — Telephone Encounter (Signed)
Looks like this medication was discontinued on 12/08/2019 while in the hospital. Please review and advise if patient should resume medication.

## 2019-12-22 NOTE — Telephone Encounter (Signed)
[facility] faxed refill request for the following medications:  Alendronate   Please advise.  Thanks, [employer]

## 2019-12-23 ENCOUNTER — Ambulatory Visit: Payer: Medicare HMO

## 2019-12-23 ENCOUNTER — Telehealth: Payer: Self-pay | Admitting: *Deleted

## 2019-12-23 DIAGNOSIS — C184 Malignant neoplasm of transverse colon: Secondary | ICD-10-CM

## 2019-12-23 MED ORDER — ONDANSETRON HCL 8 MG PO TABS: 8.0000 mg | ORAL_TABLET | Freq: Two times a day (BID) | ORAL | 3 refills | Status: DC | PRN

## 2019-12-23 NOTE — Telephone Encounter (Signed)
Rx on 3/17 was for 11 refills.  Will send in for 90 day day supply at a time.

## 2019-12-23 NOTE — Telephone Encounter (Signed)
90 day supply rx was sent to [facility]

## 2019-12-23 NOTE — Telephone Encounter (Signed)
I called and spoke with patient. Patient states she has not been taking that medication. I advised patient that she should continue to keep this medication on hold for now. Patient verbalized understanding. I called [facility] mail order pharmacy and notified them that the medication has been discontinued.

## 2019-12-23 NOTE — Telephone Encounter (Signed)
Yes, I thing this was put on hold. Please check with patient and make sure she has stopped taking it... which is probably a good idea for the time being. If so fax order back stating medication has been discontinued so they stop sending refill requests. Thanks.

## 2019-12-23 NOTE — Telephone Encounter (Signed)
[employer] requesting a 90 fill of ondansetron be sent to them Please advise

## 2019-12-24 ENCOUNTER — Other Ambulatory Visit: Payer: Self-pay

## 2019-12-24 ENCOUNTER — Observation Stay
Admission: EM | Admit: 2019-12-24 | Discharge: 2019-12-25 | Disposition: A | Discharge status: Home / Self Care | Payer: Medicare HMO | Attending: Internal Medicine | Admitting: Internal Medicine

## 2019-12-24 ENCOUNTER — Inpatient Hospital Stay (HOSPITAL_BASED_OUTPATIENT_CLINIC_OR_DEPARTMENT_OTHER): Payer: Medicare HMO | Admitting: Oncology

## 2019-12-24 ENCOUNTER — Encounter: Payer: Self-pay | Admitting: Oncology

## 2019-12-24 ENCOUNTER — Encounter: Payer: Self-pay | Admitting: Internal Medicine

## 2019-12-24 ENCOUNTER — Emergency Department: Payer: Medicare HMO

## 2019-12-24 ENCOUNTER — Inpatient Hospital Stay: Payer: Medicare HMO

## 2019-12-24 VITALS — BP 147/71 | HR 60 | Temp 96.4°F | Resp 18 | Wt 158.3 lb

## 2019-12-24 VITALS — BP 141/108

## 2019-12-24 DIAGNOSIS — Z803 Family history of malignant neoplasm of breast: Secondary | ICD-10-CM | POA: Diagnosis not present

## 2019-12-24 DIAGNOSIS — D72829 Elevated white blood cell count, unspecified: Secondary | ICD-10-CM | POA: Diagnosis not present

## 2019-12-24 DIAGNOSIS — C189 Malignant neoplasm of colon, unspecified: Secondary | ICD-10-CM | POA: Diagnosis not present

## 2019-12-24 DIAGNOSIS — Z87891 Personal history of nicotine dependence: Secondary | ICD-10-CM | POA: Diagnosis not present

## 2019-12-24 DIAGNOSIS — Z800 Family history of malignant neoplasm of digestive organs: Secondary | ICD-10-CM | POA: Insufficient documentation

## 2019-12-24 DIAGNOSIS — T8090XA Unspecified complication following infusion and therapeutic injection, initial encounter: Secondary | ICD-10-CM

## 2019-12-24 DIAGNOSIS — Z853 Personal history of malignant neoplasm of breast: Secondary | ICD-10-CM | POA: Diagnosis not present

## 2019-12-24 DIAGNOSIS — D509 Iron deficiency anemia, unspecified: Secondary | ICD-10-CM | POA: Diagnosis not present

## 2019-12-24 DIAGNOSIS — Z7189 Other specified counseling: Secondary | ICD-10-CM

## 2019-12-24 DIAGNOSIS — Z5111 Encounter for antineoplastic chemotherapy: Secondary | ICD-10-CM | POA: Diagnosis not present

## 2019-12-24 DIAGNOSIS — C184 Malignant neoplasm of transverse colon: Secondary | ICD-10-CM

## 2019-12-24 DIAGNOSIS — Z9981 Dependence on supplemental oxygen: Secondary | ICD-10-CM | POA: Diagnosis not present

## 2019-12-24 DIAGNOSIS — Z825 Family history of asthma and other chronic lower respiratory diseases: Secondary | ICD-10-CM | POA: Insufficient documentation

## 2019-12-24 DIAGNOSIS — Z20822 Contact with and (suspected) exposure to covid-19: Secondary | ICD-10-CM | POA: Insufficient documentation

## 2019-12-24 DIAGNOSIS — I480 Paroxysmal atrial fibrillation: Secondary | ICD-10-CM | POA: Diagnosis not present

## 2019-12-24 DIAGNOSIS — G893 Neoplasm related pain (acute) (chronic): Secondary | ICD-10-CM | POA: Diagnosis not present

## 2019-12-24 DIAGNOSIS — J449 Chronic obstructive pulmonary disease, unspecified: Secondary | ICD-10-CM

## 2019-12-24 DIAGNOSIS — Z7901 Long term (current) use of anticoagulants: Secondary | ICD-10-CM | POA: Insufficient documentation

## 2019-12-24 DIAGNOSIS — T451X5A Adverse effect of antineoplastic and immunosuppressive drugs, initial encounter: Secondary | ICD-10-CM | POA: Diagnosis not present

## 2019-12-24 DIAGNOSIS — J441 Chronic obstructive pulmonary disease with (acute) exacerbation: Secondary | ICD-10-CM | POA: Diagnosis not present

## 2019-12-24 DIAGNOSIS — Y92239 Unspecified place in hospital as the place of occurrence of the external cause: Secondary | ICD-10-CM | POA: Diagnosis not present

## 2019-12-24 DIAGNOSIS — I4891 Unspecified atrial fibrillation: Secondary | ICD-10-CM | POA: Diagnosis not present

## 2019-12-24 DIAGNOSIS — K219 Gastro-esophageal reflux disease without esophagitis: Secondary | ICD-10-CM | POA: Diagnosis not present

## 2019-12-24 DIAGNOSIS — Z8249 Family history of ischemic heart disease and other diseases of the circulatory system: Secondary | ICD-10-CM | POA: Insufficient documentation

## 2019-12-24 DIAGNOSIS — D500 Iron deficiency anemia secondary to blood loss (chronic): Secondary | ICD-10-CM

## 2019-12-24 DIAGNOSIS — Z66 Do not resuscitate: Secondary | ICD-10-CM | POA: Insufficient documentation

## 2019-12-24 DIAGNOSIS — K5903 Drug induced constipation: Secondary | ICD-10-CM

## 2019-12-24 DIAGNOSIS — L500 Allergic urticaria: Secondary | ICD-10-CM | POA: Diagnosis not present

## 2019-12-24 DIAGNOSIS — T886XXA Anaphylactic reaction due to adverse effect of correct drug or medicament properly administered, initial encounter: Secondary | ICD-10-CM | POA: Diagnosis not present

## 2019-12-24 DIAGNOSIS — I700 Atherosclerosis of aorta: Secondary | ICD-10-CM | POA: Insufficient documentation

## 2019-12-24 DIAGNOSIS — Z888 Allergy status to other drugs, medicaments and biological substances status: Secondary | ICD-10-CM | POA: Diagnosis not present

## 2019-12-24 DIAGNOSIS — Z5112 Encounter for antineoplastic immunotherapy: Secondary | ICD-10-CM | POA: Diagnosis not present

## 2019-12-24 DIAGNOSIS — Z91041 Radiographic dye allergy status: Secondary | ICD-10-CM | POA: Insufficient documentation

## 2019-12-24 DIAGNOSIS — Z79899 Other long term (current) drug therapy: Secondary | ICD-10-CM | POA: Diagnosis not present

## 2019-12-24 DIAGNOSIS — T782XXA Anaphylactic shock, unspecified, initial encounter: Secondary | ICD-10-CM | POA: Diagnosis present

## 2019-12-24 DIAGNOSIS — R634 Abnormal weight loss: Secondary | ICD-10-CM | POA: Diagnosis not present

## 2019-12-24 DIAGNOSIS — R5383 Other fatigue: Secondary | ICD-10-CM | POA: Diagnosis not present

## 2019-12-24 DIAGNOSIS — R0602 Shortness of breath: Secondary | ICD-10-CM | POA: Diagnosis not present

## 2019-12-24 DIAGNOSIS — C786 Secondary malignant neoplasm of retroperitoneum and peritoneum: Secondary | ICD-10-CM | POA: Diagnosis not present

## 2019-12-24 DIAGNOSIS — Z7951 Long term (current) use of inhaled steroids: Secondary | ICD-10-CM | POA: Insufficient documentation

## 2019-12-24 DIAGNOSIS — G629 Polyneuropathy, unspecified: Secondary | ICD-10-CM | POA: Diagnosis not present

## 2019-12-24 LAB — CBC WITH DIFFERENTIAL/PLATELET
Abs Immature Granulocytes: 0.06 10*3/uL (ref 0.00–0.07)
Abs Immature Granulocytes: 0.04 10*3/uL (ref 0.00–0.07)
Basophils Absolute: 0.1 10*3/uL (ref 0.0–0.1)
Basophils Absolute: 0 10*3/uL (ref 0.0–0.1)
Basophils Relative: 1 %
Basophils Relative: 0 %
Eosinophils Absolute: 0.3 10*3/uL (ref 0.0–0.5)
Eosinophils Absolute: 0 10*3/uL (ref 0.0–0.5)
Eosinophils Relative: 2 %
Eosinophils Relative: 0 %
HCT: 39.6 % (ref 36.0–46.0)
HCT: 44 % (ref 36.0–46.0)
Hemoglobin: 12.4 g/dL (ref 12.0–15.0)
Hemoglobin: 13.8 g/dL (ref 12.0–15.0)
Immature Granulocytes: 1 %
Immature Granulocytes: 1 %
Lymphocytes Relative: 21 %
Lymphocytes Relative: 66 %
Lymphs Abs: 2.3 10*3/uL (ref 0.7–4.0)
Lymphs Abs: 5.4 10*3/uL — ABNORMAL HIGH (ref 0.7–4.0)
MCH: 27.2 pg (ref 26.0–34.0)
MCH: 27 pg (ref 26.0–34.0)
MCHC: 31.3 g/dL (ref 30.0–36.0)
MCHC: 31.4 g/dL (ref 30.0–36.0)
MCV: 86.8 fL (ref 80.0–100.0)
MCV: 86.1 fL (ref 80.0–100.0)
Monocytes Absolute: 1.1 10*3/uL — ABNORMAL HIGH (ref 0.1–1.0)
Monocytes Absolute: 0.3 10*3/uL (ref 0.1–1.0)
Monocytes Relative: 10 %
Monocytes Relative: 4 %
Neutro Abs: 7.3 10*3/uL (ref 1.7–7.7)
Neutro Abs: 2.4 10*3/uL (ref 1.7–7.7)
Neutrophils Relative %: 65 %
Neutrophils Relative %: 29 %
Platelets: 379 10*3/uL (ref 150–400)
Platelets: 418 10*3/uL — ABNORMAL HIGH (ref 150–400)
RBC: 4.56 MIL/uL (ref 3.87–5.11)
RBC: 5.11 MIL/uL (ref 3.87–5.11)
RDW: 18.5 % — ABNORMAL HIGH (ref 11.5–15.5)
RDW: 18.7 % — ABNORMAL HIGH (ref 11.5–15.5)
WBC: 11.1 10*3/uL — ABNORMAL HIGH (ref 4.0–10.5)
WBC: 8.2 10*3/uL (ref 4.0–10.5)
nRBC: 0 % (ref 0.0–0.2)
nRBC: 0 % (ref 0.0–0.2)

## 2019-12-24 LAB — COMPREHENSIVE METABOLIC PANEL
ALT: 10 U/L (ref 0–44)
AST: 12 U/L — ABNORMAL LOW (ref 15–41)
Albumin: 3.4 g/dL — ABNORMAL LOW (ref 3.5–5.0)
Alkaline Phosphatase: 72 U/L (ref 38–126)
Anion gap: 8 (ref 5–15)
BUN: 19 mg/dL (ref 8–23)
CO2: 26 mmol/L (ref 22–32)
Calcium: 8.3 mg/dL — ABNORMAL LOW (ref 8.9–10.3)
Chloride: 100 mmol/L (ref 98–111)
Creatinine, Ser: 0.66 mg/dL (ref 0.44–1.00)
GFR calc Af Amer: >60 mL/min (ref >60)
GFR calc non Af Amer: >60 mL/min (ref >60)
Glucose, Bld: 94 mg/dL (ref 70–99)
Potassium: 4.4 mmol/L (ref 3.5–5.1)
Sodium: 134 mmol/L — ABNORMAL LOW (ref 135–145)
Total Bilirubin: 0.4 mg/dL (ref 0.3–1.2)
Total Protein: 7.1 g/dL (ref 6.5–8.1)

## 2019-12-24 LAB — BASIC METABOLIC PANEL
Anion gap: 12 (ref 5–15)
BUN: 17 mg/dL (ref 8–23)
CO2: 23 mmol/L (ref 22–32)
Calcium: 8.6 mg/dL — ABNORMAL LOW (ref 8.9–10.3)
Chloride: 104 mmol/L (ref 98–111)
Creatinine, Ser: 0.69 mg/dL (ref 0.44–1.00)
GFR calc Af Amer: >60 mL/min (ref >60)
GFR calc non Af Amer: >60 mL/min (ref >60)
Glucose, Bld: 125 mg/dL — ABNORMAL HIGH (ref 70–99)
Potassium: 4.5 mmol/L (ref 3.5–5.1)
Sodium: 139 mmol/L (ref 135–145)

## 2019-12-24 LAB — TROPONIN I (HIGH SENSITIVITY)
Troponin I (High Sensitivity): 9 ng/L (ref <18)
Troponin I (High Sensitivity): 7 ng/L (ref <18)

## 2019-12-24 LAB — MAGNESIUM: Magnesium: 2 mg/dL (ref 1.7–2.4)

## 2019-12-24 MED ORDER — ONDANSETRON HCL 4 MG/2ML IJ SOLN: 4.0000 mg | Freq: Four times a day (QID) | INTRAMUSCULAR | Status: DC | PRN

## 2019-12-24 MED ORDER — PRAVASTATIN SODIUM 40 MG PO TABS
40.0000 mg | ORAL_TABLET | Freq: Every day | ORAL | Status: DC
  Administered 2019-12-24: 40 mg via ORAL
  Filled 2019-12-24: qty 1

## 2019-12-24 MED ORDER — CALCIUM CITRATE-VITAMIN D 500-500 MG-UNIT PO CHEW
1.0000 | CHEWABLE_TABLET | Freq: Every day | ORAL | Status: DC
  Administered 2019-12-25: 1 via ORAL
  Filled 2019-12-24: qty 1

## 2019-12-24 MED ORDER — IPRATROPIUM-ALBUTEROL 0.5-2.5 (3) MG/3ML IN SOLN
3.0000 mL | RESPIRATORY_TRACT | Status: DC
  Administered 2019-12-24 – 2019-12-25 (×5): 3 mL via RESPIRATORY_TRACT
  Filled 2019-12-24 (×5): qty 3

## 2019-12-24 MED ORDER — METHYLPREDNISOLONE SODIUM SUCC 125 MG IJ SOLR
125.0000 mg | Freq: Once | INTRAMUSCULAR | Status: AC | PRN
  Administered 2019-12-24: 125 mg via INTRAVENOUS

## 2019-12-24 MED ORDER — CETUXIMAB CHEMO IV INJECTION 200 MG/100ML
400.0000 mg/m2 | Freq: Once | INTRAVENOUS | Status: DC
  Administered 2019-12-24: 700 mg via INTRAVENOUS
  Filled 2019-12-24: qty 350

## 2019-12-24 MED ORDER — HEPARIN SOD (PORK) LOCK FLUSH 100 UNIT/ML IV SOLN
500.0000 [IU] | Freq: Once | INTRAVENOUS | Status: DC
  Filled 2019-12-24: qty 5

## 2019-12-24 MED ORDER — DIPHENHYDRAMINE HCL 50 MG/ML IJ SOLN: 12.5000 mg | Freq: Four times a day (QID) | INTRAMUSCULAR | Status: DC | PRN

## 2019-12-24 MED ORDER — DIPHENHYDRAMINE HCL 50 MG/ML IJ SOLN
50.0000 mg | Freq: Once | INTRAMUSCULAR | Status: AC | PRN
  Administered 2019-12-24: 50 mg via INTRAVENOUS

## 2019-12-24 MED ORDER — SODIUM CHLORIDE 0.9% FLUSH
10.0000 mL | INTRAVENOUS | Status: DC | PRN
  Administered 2019-12-24: 10 mL via INTRAVENOUS
  Filled 2019-12-24: qty 10

## 2019-12-24 MED ORDER — METOPROLOL TARTRATE 25 MG PO TABS
25.0000 mg | ORAL_TABLET | Freq: Two times a day (BID) | ORAL | Status: DC
  Administered 2019-12-24 – 2019-12-25 (×2): 25 mg via ORAL
  Filled 2019-12-24 (×2): qty 1

## 2019-12-24 MED ORDER — SODIUM CHLORIDE 0.9 % IV SOLN
Freq: Once | INTRAVENOUS | Status: DC | PRN
  Filled 2019-12-24: qty 250

## 2019-12-24 MED ORDER — GABAPENTIN 300 MG PO CAPS
600.0000 mg | ORAL_CAPSULE | Freq: Three times a day (TID) | ORAL | Status: DC
  Administered 2019-12-24 – 2019-12-25 (×3): 600 mg via ORAL
  Filled 2019-12-24 (×3): qty 2

## 2019-12-24 MED ORDER — BISACODYL 10 MG RE SUPP: 10.0000 mg | Freq: Every day | RECTAL | Status: DC

## 2019-12-24 MED ORDER — IPRATROPIUM-ALBUTEROL 0.5-2.5 (3) MG/3ML IN SOLN
RESPIRATORY_TRACT | Status: AC
  Administered 2019-12-24: 3 mL
  Filled 2019-12-24: qty 3

## 2019-12-24 MED ORDER — SODIUM CHLORIDE 0.9% FLUSH
3.0000 mL | Freq: Two times a day (BID) | INTRAVENOUS | Status: DC
  Administered 2019-12-24 – 2019-12-25 (×3): 3 mL via INTRAVENOUS

## 2019-12-24 MED ORDER — FLUTICASONE FUROATE-VILANTEROL 100-25 MCG/INH IN AEPB
1.0000 | INHALATION_SPRAY | Freq: Every day | RESPIRATORY_TRACT | Status: DC
  Administered 2019-12-24 – 2019-12-25 (×2): 1 via RESPIRATORY_TRACT
  Filled 2019-12-24: qty 28

## 2019-12-24 MED ORDER — HEPARIN SOD (PORK) LOCK FLUSH 100 UNIT/ML IV SOLN
500.0000 [IU] | Freq: Once | INTRAVENOUS | Status: DC | PRN
  Filled 2019-12-24: qty 5

## 2019-12-24 MED ORDER — MONTELUKAST SODIUM 10 MG PO TABS
10.0000 mg | ORAL_TABLET | Freq: Every day | ORAL | Status: DC
  Filled 2019-12-24: qty 1

## 2019-12-24 MED ORDER — ONDANSETRON HCL 4 MG PO TABS: 4.0000 mg | ORAL_TABLET | Freq: Four times a day (QID) | ORAL | Status: DC | PRN

## 2019-12-24 MED ORDER — ACETAMINOPHEN 650 MG RE SUPP: 650.0000 mg | Freq: Four times a day (QID) | RECTAL | Status: DC | PRN

## 2019-12-24 MED ORDER — APIXABAN 5 MG PO TABS
5.0000 mg | ORAL_TABLET | Freq: Two times a day (BID) | ORAL | Status: DC
  Administered 2019-12-24 – 2019-12-25 (×2): 5 mg via ORAL
  Filled 2019-12-24 (×2): qty 1

## 2019-12-24 MED ORDER — FAMOTIDINE IN NACL 20-0.9 MG/50ML-% IV SOLN
20.0000 mg | Freq: Once | INTRAVENOUS | Status: AC
  Administered 2019-12-24: 20 mg via INTRAVENOUS
  Filled 2019-12-24: qty 50

## 2019-12-24 MED ORDER — IPRATROPIUM-ALBUTEROL 0.5-2.5 (3) MG/3ML IN SOLN: 3.0000 mL | Freq: Once | RESPIRATORY_TRACT | Status: AC

## 2019-12-24 MED ORDER — FAMOTIDINE IN NACL 20-0.9 MG/50ML-% IV SOLN
20.0000 mg | Freq: Once | INTRAVENOUS | Status: AC | PRN
  Administered 2019-12-24: 20 mg via INTRAVENOUS

## 2019-12-24 MED ORDER — IPRATROPIUM-ALBUTEROL 0.5-2.5 (3) MG/3ML IN SOLN
RESPIRATORY_TRACT | Status: AC
  Administered 2019-12-24: 3 mL via RESPIRATORY_TRACT
  Filled 2019-12-24: qty 3

## 2019-12-24 MED ORDER — SENNOSIDES-DOCUSATE SODIUM 8.6-50 MG PO TABS: 2.0000 | ORAL_TABLET | Freq: Every day | ORAL | 1 refills | Status: DC

## 2019-12-24 MED ORDER — METHYLPREDNISOLONE SODIUM SUCC 125 MG IJ SOLR
60.0000 mg | Freq: Every day | INTRAMUSCULAR | Status: DC
  Administered 2019-12-25: 60 mg via INTRAVENOUS
  Filled 2019-12-24: qty 2

## 2019-12-24 MED ORDER — SODIUM CHLORIDE 0.9 % IV SOLN
Freq: Once | INTRAVENOUS | Status: AC
  Filled 2019-12-24: qty 250

## 2019-12-24 MED ORDER — HEPARIN SOD (PORK) LOCK FLUSH 100 UNIT/ML IV SOLN
INTRAVENOUS | Status: AC
  Filled 2019-12-24: qty 5

## 2019-12-24 MED ORDER — ACETAMINOPHEN 325 MG PO TABS: 650.0000 mg | ORAL_TABLET | Freq: Four times a day (QID) | ORAL | Status: DC | PRN

## 2019-12-24 MED ORDER — SENNOSIDES-DOCUSATE SODIUM 8.6-50 MG PO TABS
2.0000 | ORAL_TABLET | Freq: Every day | ORAL | Status: DC
  Administered 2019-12-24 – 2019-12-25 (×2): 2 via ORAL
  Filled 2019-12-24 (×2): qty 2

## 2019-12-24 MED ORDER — HYDROCODONE-ACETAMINOPHEN 5-325 MG PO TABS: 1.0000 | ORAL_TABLET | Freq: Four times a day (QID) | ORAL | Status: DC | PRN

## 2019-12-24 MED ORDER — TRIAMCINOLONE ACETONIDE 55 MCG/ACT NA AERO
1.0000 | INHALATION_SPRAY | Freq: Every day | NASAL | Status: DC
  Administered 2019-12-25: 1 via NASAL
  Filled 2019-12-24: qty 10.8

## 2019-12-24 MED ORDER — DIPHENHYDRAMINE HCL 50 MG/ML IJ SOLN
50.0000 mg | Freq: Once | INTRAMUSCULAR | Status: AC
  Administered 2019-12-24: 50 mg via INTRAVENOUS
  Filled 2019-12-24: qty 1

## 2019-12-24 NOTE — Progress Notes (Signed)
Patient here for new treatment cetuximab. Patient reports having nausea and constipation. Patient states she is feeling sick on her stomach today.

## 2019-12-24 NOTE — H&P (Signed)
History and Physical    Kimberly [name] [id number] DOB: Oct 29, 1945 DOA: 12/24/2019  PCP: Birdie Sons, MD   Patient coming from: [facility] at home but sent from cancer center  I have personally briefly reviewed patient's old medical records in [place]  Chief Complaint: Hives and difficulty breathing following   HPI: Kimberly [name] is a 74 y.o. female with medical history significant of colon cancer, chronic back pain, COPD uses oxygen at night, and paroxysmal A. fib on Eliquis, with recurrence of her colon cancer and she was being started today for chemotherapy with erbitux, within 15 minutes she began to develop hives, shortness of breath and the sensation of her throat getting tight.  She was brought emergently to the ED for evaluation.  Patient had no new complaints prior to going to cancer center to get her infusion.  15 minutes into infusion she started developing hives and feeling of choking.  She was given Solu-Medrol and Benadryl at the infusion center and she was sent to ED.  She received EpiPen along with another round of Solu-Medrol and Benadryl along with multiple breathing treatments.  She was feeling better when seen.  Able to speak in full sentences.  Choking sensation has been resolved.  She was able to swallow and would like to try some food.  No nausea or vomiting, no chest pain or shortness of breath, no recent change in her appetite, weight or bowel habits.  No urinary symptoms.  ED Course: On arrival she was tachycardic, tachypneic and saturating in low 80s.  She was given epi and received steroid and Benadryl at cancer center prior to coming.  Patient with extensive wheezing, sensation of throat closing, appropriate for intubation but patient refused.  Received multiple rounds of epi with slow improvement.  Hospitalist were to admit for further evaluation and management.  Review of Systems: As per HPI otherwise 10 point review of systems negative.   Past Medical  History:  Diagnosis Date  . Breast cancer, left ([place]) 2004   Lumpectomy and rad tx's.  . Chronic back pain   . Colon cancer ([place]) 2018  . COPD (chronic obstructive pulmonary disease) ([place])   . Degenerative disc disease, lumbar 04/2013  . Family history of adverse reaction to anesthesia    son arrested after anesthesia  . GERD (gastroesophageal reflux disease)   . Iron deficiency anemia 05/27/2017  . Personal history of chemotherapy 2018   Colon  . Personal history of radiation therapy    Breast 2004  . Personal history of radiation therapy 2018   Colon  . Postprocedural intraabdominal abscess 09/01/2018  . Small bowel obstruction ([place]) 04/16/2017  . Wears dentures    full upper    Past Surgical History:  Procedure Laterality Date  . APPENDECTOMY  1965  . BREAST EXCISIONAL BIOPSY Left 08/01/2003   lumpectomy rad 11/04-2/28/2005  . BREAST SURGERY    . CESAREAN SECTION     x3  . COLON SURGERY    . COLONOSCOPY W/ POLYPECTOMY    . COLONOSCOPY WITH PROPOFOL N/A 04/09/2018   Procedure: COLONOSCOPY WITH PROPOFOL;  Surgeon: Virgel Manifold, MD;  Location: [facility] END[facility]COPY;  Service: Gastroenterology;  Laterality: N/A;  . COLONOSCOPY WITH PROPOFOL N/A 08/13/2019   Procedure: COLONOSCOPY WITH PROPOFOL;  Surgeon: Virgel Manifold, MD;  Location: [facility];  Service: Endoscopy;  Laterality: N/A;  . COLOSTOMY TAKEDOWN N/A 06/25/2018   Procedure: COLOSTOMY TAKEDOWN;  Surgeon: Jules Husbands, MD;  Location: [facility]  ORS;  Service: General;  Laterality: N/A;  . ESOPHAGOGASTRODUODENOSCOPY (EGD) WITH PROPOFOL N/A 04/04/2017   Procedure: ESOPHAGOGASTRODUODENOSCOPY (EGD) WITH PROPOFOL;  Surgeon: [name], MD;  Location: [facility];  Service: Endoscopy;  Laterality: N/A;  . FRACTURE SURGERY    . HIP FRACTURE SURGERY Left 01/25/2012  . LAPAROSCOPY N/A 12/07/2019   Procedure: LAPAROSCOPY DIAGNOSTIC;  Surgeon: Jules Husbands, MD;  Location: [facility] ORS;  Service: General;  Laterality:  N/A;  . LAPAROTOMY N/A 04/15/2017   Procedure: EXPLORATORY LAPAROTOMY;  Surgeon: Clayburn Pert, MD;  Location: [facility] ORS;  Service: General;  Laterality: N/A;  . NECK SURGERY  12/2011  . PARASTOMAL HERNIA REPAIR N/A 12/03/2017   Procedure: HERNIA REPAIR PARASTOMAL;  Surgeon: Jules Husbands, MD;  Location: [facility] ORS;  Service: General;  Laterality: N/A;  . PARASTOMAL HERNIA REPAIR N/A 06/25/2018   Procedure: HERNIA REPAIR PARASTOMAL;  Surgeon: Jules Husbands, MD;  Location: [facility] ORS;  Service: General;  Laterality: N/A;  . PORTACATH PLACEMENT Right 05/21/2017   Procedure: INSERTION PORT-A-CATH;  Surgeon: Clayburn Pert, MD;  Location: [facility] ORS;  Service: General;  Laterality: Right;  . PORTACATH PLACEMENT Right 12/07/2019   Procedure: INSERTION PORT-A-CATH;  Surgeon: Jules Husbands, MD;  Location: [facility] ORS;  Service: General;  Laterality: Right;  . WRIST FRACTURE SURGERY       reports that she quit smoking about 2 years ago. Her smoking use included cigarettes. She has a 13.75 pack-year smoking history. She has never used smokeless tobacco. She reports current alcohol use. She reports that she does not use drugs.  Allergies  Allergen Reactions  . Erbitux [Cetuximab] Anaphylaxis  . Betadine [Povidone Iodine] Other (See Comments)    Topical iodine she states "if you put it in an open sore it will cause a eating ulcer."  . Iodine Other (See Comments)    Patient states "it gives me infections"  . Other Nausea And Vomiting    Antihystamines  . Sinus Formula [Cholestatin] Nausea Only    Family History  Problem Relation Age of Onset  . Diabetes Mother        type 2  . Coronary artery disease Mother   . Deep vein thrombosis Mother   . Colon cancer Father   . Asthma Brother   . Diabetes Brother        type 2  . Breast cancer Neg Hx     Prior to Admission medications   Medication Sig Start Date End Date Taking? Authorizing Provider  apixaban (ELIQUIS) 5 MG TABS tablet Take 1 tablet (5 mg  total) by mouth 2 (two) times daily. 12/10/19 01/09/20  S[place]ney Ace, MD  budesonide-formoterol (SYMBICORT) 80-4.5 MCG/ACT inhaler Inhale 2 puffs into the lungs 2 (two) times daily. 06/09/18   Laverle Hobby, MD  calcium citrate-vitamin D (CITRACAL+D) 315-200 MG-UNIT tablet Take 1 tablet by mouth daily.    [provider]  Cholecalciferol (VITAMIN D3) 50 MCG (2000 UT) TABS Take 2,000 Units by mouth daily. 11/05/19   Birdie Sons, MD  encorafenib (BRAFTOVI) 75 MG capsule Take 4 capsules (300 mg total) by mouth daily. Patient not taking: Reported on 12/24/2019 12/15/19   Earlie Server, MD  gabapentin (NEURONTIN) 400 MG capsule TAKE 1 CAPSULE THREE TIMES DAILY Patient taking differently: Take 600 mg by mouth 3 (three) times daily.  11/08/19   Earlie Server, MD  HYDROcodone-acetaminophen (NORCO/VICODIN) 5-325 MG tablet Take 1-2 tablets by mouth every 6 (six) hours as needed for moderate pain. 12/07/19  Pabon, [place] F, MD  lidocaine-prilocaine (EMLA) cream Apply to affected area once 12/15/19   Earlie Server, MD  meloxicam (MOBIC) 15 MG tablet TAKE 1 TABLET EVERY DAY AS NEEDED Patient taking differently: Take 15 mg by mouth at bedtime.  05/20/19   Birdie Sons, MD  metoprolol tartrate (LOPRESSOR) 25 MG tablet Take 1 tablet (25 mg total) by mouth 2 (two) times daily. 12/10/19 01/09/20  [place]ney Ace, MD  montelukast (SINGULAIR) 10 MG tablet TAKE 1 TABLET BY MOUTH EVERY DAY Patient taking differently: Take 10 mg by mouth daily.  03/30/19   Laverle Hobby, MD  Multiple Vitamins-Minerals (WOMENS MULTI VITAMIN & MINERAL PO) Take 2 tablets by mouth daily.     [provider]  omeprazole (PRILOSEC) 20 MG capsule Take 1 capsule (20 mg total) by mouth daily. 03/10/19   Birdie Sons, MD  ondansetron (ZOFRAN) 8 MG tablet Take 1 tablet (8 mg total) by mouth 2 (two) times daily as needed (Nausea or vomiting). May take 30-60 minutes prior to Braftovi if nausea/vomiting occurs. 12/23/19   Earlie Server, MD  oxycodone (OXY-IR) 5 MG capsule Take 2 capsules (10 mg total) by mouth every 4 (four) hours as needed. 12/08/19   Pabon, [place], MD  pravastatin (PRAVACHOL) 40 MG tablet TAKE 1 TABLET EVERY DAY Patient taking differently: Take 40 mg by mouth daily.  05/20/19   Birdie Sons, MD  Probiotic Product (PROBIOTIC DAILY PO) Take 1 capsule by mouth daily.     [provider]  senna-docusate (SENOKOT-S) 8.6-50 MG tablet Take 2 tablets by mouth daily. 12/24/19   Earlie Server, MD  Triamcinolone Acetonide (NASACORT ALLERGY 24HR NA) Place 2 sprays into the nose daily as needed (allergies).     [provider]    Physical Exam: Vitals:   12/24/19 1400 12/24/19 1415 12/24/19 1430 12/24/19 1438  BP: 134/69  129/68   Pulse: 74 75 78 73  Resp: 16  17 20   Temp:      TempSrc:      SpO2: 94% (!) 84% (!) 89% 93%    General: Vital signs reviewed.  Patient is well-developed and well-nourished, in no acute distress and cooperative with exam.  Head: Normocephalic and atraumatic. Eyes: EOMI, conjunctivae normal, no scleral icterus.  ENMT: Mucous membranes are moist. Posterior pharynx clear of any exudate or lesions.  Neck: Supple, trachea midline, normal ROM, no JVD, masses, thyromegaly, or carotid bruit present.  Cardiovascular: RRR, S1 normal, S2 normal, no murmurs, gallops, or rubs. Pulmonary/Chest: Bilateral scattered wheeze. Abdominal: Soft, non-tender, non-distended, BS +, no masses, organomegaly, or guarding present.  Extremities: No lower extremity edema bilaterally,  pulses symmetric and intact bilaterally. No cyanosis or clubbing. Neurological: A&O x3, Strength is normal and symmetric bilaterally, cranial nerve II-XII are grossly intact, no focal motor deficit, sensory intact to light touch bilaterally.  Skin: Warm, dry and intact.  Some hives and erythema on left side of neck and upper shoulder. Psychiatric: Normal mood and affect. speech and behavior is normal. Cognition and  memory are normal.   Labs on Admission: I have personally reviewed following labs and imaging studies  CBC: Recent Labs  Lab 12/24/19 0823 12/24/19 1124  WBC 11.1* 8.2  NEUTROABS 7.3 2.4  HGB 12.4 13.8  HCT 39.6 44.0  MCV 86.8 86.1  PLT 379 [id number]*   Basic Metabolic Panel: Recent Labs  Lab 12/24/19 0823 12/24/19 1124  NA 134* 139  K 4.4 4.5  CL 100 104  CO2 26 23  GLUCOSE 94 125*  BUN 19 17  CREATININE 0.66 0.69  CALCIUM 8.3* 8.6*  MG 2.0  --    GFR: Estimated Creatinine Clearance: 61.7 mL/min (by C-G formula based on SCr of 0.69 mg/dL). Liver Function Tests: Recent Labs  Lab 12/24/19 0823  AST 12*  ALT 10  ALKPHOS 72  BILITOT 0.4  PROT 7.1  ALBUMIN 3.4*   No results for input(s): LIPASE, AMYLASE in the last 168 hours. No results for input(s): AMMONIA in the last 168 hours. Coagulation Profile: No results for input(s): INR, PROTIME in the last 168 hours. Cardiac Enzymes: No results for input(s): CKTOTAL, CKMB, CKMBINDEX, TROPONINI in the last 168 hours. BNP (last 3 results) No results for input(s): PROBNP in the last 8760 hours. HbA1C: No results for input(s): HGBA1C in the last 72 hours. CBG: No results for input(s): GLUCAP in the last 168 hours. Lipid Profile: No results for input(s): CHOL, HDL, LDLCALC, TRIG, CHOLHDL, LDLDIRECT in the last 72 hours. Thyroid Function Tests: No results for input(s): TSH, T4TOTAL, FREET4, T3FREE, THYROIDAB in the last 72 hours. Anemia Panel: No results for input(s): VITAMINB12, FOLATE, FERRITIN, TIBC, IRON, RETICCTPCT in the last 72 hours. Urine analysis:    Component Value Date/Time   COLORURINE AMBER (A) 11/29/2019 1340   APPEARANCEUR HAZY (A) 11/29/2019 1340   APPEARANCEUR Hazy 01/26/2012 0108   LABSPEC 1.029 11/29/2019 1340   LABSPEC 1.021 01/26/2012 0108   PHURINE 5.0 11/29/2019 1340   GLUCOSEU NEGATIVE 11/29/2019 1340   GLUCOSEU Negative 01/26/2012 0108   HGBUR NEGATIVE 11/29/2019 1340   BILIRUBINUR SMALL  (A) 11/29/2019 1340   BILIRUBINUR Negative 01/26/2012 0108   KETONESUR NEGATIVE 11/29/2019 1340   PROTEINUR 30 (A) 11/29/2019 1340   NITRITE NEGATIVE 11/29/2019 1340   LEUKOCYTESUR NEGATIVE 11/29/2019 1340   LEUKOCYTESUR Negative 01/26/2012 0108    Radiological Exams on Admission: DG Chest Portable 1 View  Result Date: 12/24/2019 CLINICAL DATA:  Shortness of breath and urticaria EXAM: PORTABLE CHEST 1 VIEW COMPARISON:  December 08, 2019 FINDINGS: Port-A-Cath tip is in the superior vena cava. No pneumothorax. The lungs are clear. Heart is upper normal in size with pulmonary vascularity normal. No adenopathy. There is aortic atherosclerosis. No bone lesions. IMPRESSION: Stable Port-A-Cath position. No pneumothorax. No edema or airspace opacity. Stable cardiac silhouette. No adenopathy. Aortic Atherosclerosis (ICD10-I70.0). Electronically Signed   By: Lowella Grip III M.D.   On: 12/24/2019 11:45    EKG: Independently reviewed.  Sinus rhythm with premature ventricular contractions and T wave inversions in lateral leads.  Assessment/Plan Active Problems:   Anaphylaxis   Anaphylaxis.  Her symptoms are consistent with anaphylaxis reaction secondary to cetuximab, today was her first infusion. Appears improving, continue to have some wheeze. -Continue with Solu-Medrol 60 mg daily. -Continue with Benadryl 12.5 mg every 4 hourly as needed. -EpiPen if started getting worse again.  COPD exacerbation secondary to anaphylaxis. Patient with bilateral wheeze and requiring 3 L to saturate in mid 90s.  Normally uses 2 L at bedtime.  Seems improving. -DuoNeb every 4 hourly. -Solu-Medrol 60 mg daily. -Continue home inhalers and Singulair.  Paroxysmal A. Fib.  In sinus rhythm at this time -Continue home dose of metoprolol and Eliquis.  DVT prophylaxis: Eliquis Code Status: DNR Family Communication: No family at bedside, daughter just left from the hospital.  She was updated by ER  physician. Disposition Plan: Pending improvement and observing overnight. Consults called: None  Admission status: Observation   Lorella Nimrod MD [facility]  If 7PM-7AM, please contact night-coverage [email or web address]  12/24/2019, 3:20 PM   This record has been created using Systems analyst. Errors have been sought and corrected,but may not always be located. Such creation errors do not reflect on the standard of care.

## 2019-12-24 NOTE — ED Notes (Signed)
Oxygen placed back on at 3 liter's  [place]

## 2019-12-24 NOTE — Progress Notes (Signed)
Pt arrived to chair for first time cetuximab infusion. Pt c/o nausea and headache, but stated that it resolved after receiving ordered premedications of IV benadryl and pepcid. Cetuximab was started at 1032. At 1054 pt was noticed by this RN to be restless in chair. This RN asked pt how she was feeling to which pt stated she felt itchy and had to go the bathroom. This RN stopped this cetuximab infusion and informed pt that we had to give her some more medications since she was reacting to the treatment. The pt refused and stated she needed to go to the bathroom. Benjamine Mola, RN was at chairside and stated she would notify Dr. Tasia Catchings that pt was reacting to the treatment. Pt returned to chair from bathroom at 1100 and a a NS IVF bolus was initiated, along with pt receiving solu medrol and additional IV benadryl/pepcid per protocol orders. Pt was still reporting itching on her back, stomach, and legs, and a red rash was observed on face, neck, and arms. BP was 141/108 and O2 sat was 88% on RA. Pt was placed on 6L O2 [place] and O2 sat increased to 93%. Dr. Tasia Catchings arrived at chairside and stated for pt to be transported to ED. Pt was moved to stretcher and brought by RNs to ED. En route to ED, pt reported worsening itching, new SOB, and a sensation of her throat closing. Handoff given to ED RN.

## 2019-12-24 NOTE — ED Notes (Signed)
Admission MD at bedside.  

## 2019-12-24 NOTE — ED Notes (Signed)
Port in right chest is noted to be accessed (completed at cancer center). Dressing is dry, clean, intact.

## 2019-12-24 NOTE — Addendum Note (Signed)
Addended by: Earlie Server on: 12/24/2019 11:13 AM   Modules accepted: Level of Service

## 2019-12-24 NOTE — Plan of Care (Signed)

## 2019-12-24 NOTE — Progress Notes (Addendum)
[facility] [facility] Follow up  Visit:  Patient Care Team: Birdie Sons, MD as PCP - General (Family Medicine) Earlie Server, MD as Consulting Physician (Oncology) Noreene Filbert, MD as Referring Physician (Radiation Oncology) Jules Husbands, MD as Consulting Physician (General Surgery) Benedetto Goad, RN as Registered Nurse Cathi Roan, [facility] (Pharmacist) Clent Jacks, RN as Oncology Nurse Navigator  PURPOSE OF VISIT: Stage IV colon cancer  HISTORY OF PRESENTING ILLNESS: Kimberly [name] 74 y.o. female with PMH listed as below presents for follow up for the management of Stage IIIB Colon Cancer Patient reports a remote history of breast cancer s/p lumpectomy and radiation treatments.   Patient had been having abdominal pain, weight loss and bloating.  CT scan showed partial obstruction at the level of transverse colon and ileocolic mesenteric adenopathy. She was prepared for a colonoscopy on 04/15/2017. She started to have worsened abdominal pain, nausea vomiting, unable to maintain oral intake and was sent to ED. She was found to have bowel obstruction and underwent  exploratory laparotomy with transverse colectomy, with end colostomy and mucous fistula formation for obstructing colon lesion. Differential diagnosis prior to surgery was metastatic breast cancer in colon vs colon cancer. The patient did have a colonoscopy 2 years ago without the mass being present at that time but did have 2 small polyps in the transverse colon that were removed.  04/15/2017 Patient underwent transverse colon resection.  Pathology showed T4aN1 moderate differentiated adenocarcinoma, Grade 2, tumor invades visceral peritoneum, LVI present, negative margin, 3/16 lymph nodes involved with cancer. Low probability of MSI-H.   # Genetic test INVITAE came back negative. No pathogenetic sequence variants or deletions/duplications identified. Results was scanned to [employer] (a copy of the report was  provided to patient and her daughter)  # # baseline CEA is 2.1 # colostomy reversal on 06/25/2018, subsequently developed C. difficile colitis x 2.  Multiple abdominal CT abdomen and pelvis for obtained, not listed here #09/01/2018 CT abdomen pelvis showed abdominal wall abscess,  percutaneous drain placement .treatment with antibiotics with Augmentin 10 days course.  #09/14/2018 CT abdomen/pelvis showed resolution of abscess, no intraabdominal abscess.  Mediport was discontinued. # 08/13/2019 colonoscopy showed localized area of mildly thickened folds of the mucosa was found in the sigmoid colon. Biopsy pathology showed benign mucosa with lymphoid aggregates and focal minimal inflammation. No dysplasia.   TREATMENT:  04/15/2017 transverse colon resection 05/27/2017- 11/17/2017 adjuvant FOLFOX, Oxaliplatin was discontinued after 3 cycles due to worsening of pre-existing neuropathy. Finished another 9 cycles of 5-FU.  March -April 2019 adjuvant RT.  06/25/2018 Colostomy Reversal.   09/21/2019 PET scan showed 2 small right abdominal mesenteric soft tissue nodule which showed no significant change in size compared to recent CT but hypermetabolic.  Peritoneal metastatic disease cannot be excluded. 1.1 cm hypermetabolic nodule in the right parotid gland suspicious for primary parotid neoplasm.  08/27/2020, CT-guided biopsy of abdominal wall mass was obtained. Results showed metastatic adenocarcinoma, compatible with colon primary.  # 12/07/2019 She underwent surgery for laparoscopic evaluation of peritoneum and placement of Mediport. # 12/08/2019- 12/10/2019  She was admitted due to atrial fibrillation.  Patient was started on anticoagulation with Eliquis 5 mg twice daily.  Patient was reverted back to normal sinus rhythm.  She is on metoprolol for rate control.   INTERVAL HISTORY 74 y.o. female  presents for follow-up of recurrent colon cancer. Today she reports nausea, no vomiting. She takes  antiemetics at home with some relief.  Pain is controlled.  She has constipation, last BM today, small amount, hard stool.  Take colace 139m daily with Miralax PRN.  Also bilateral leg swelling, worst at the end of the day.    Review of Systems  Constitutional: Positive for fatigue. Negative for appetite change, chills, fever and unexpected weight change.  HENT:   Negative for hearing loss, lump/mass, nosebleeds and voice change.   Eyes: Negative for eye problems.  Respiratory: Negative for chest tightness, cough and shortness of breath.   Cardiovascular: Positive for leg swelling. Negative for chest pain.  Gastrointestinal: Positive for constipation and nausea. Negative for abdominal distention, abdominal pain, blood in stool and diarrhea.  Endocrine: Negative for hot flashes.  Genitourinary: Negative for difficulty urinating, dysuria and frequency.   Musculoskeletal: Negative for arthralgias, gait problem and myalgias.  Skin: Negative for itching, rash and wound.  Neurological: Negative for dizziness, extremity weakness, gait problem and headaches.       Chronic numbness and tingling of fingertips and toes.  Hematological: Negative for adenopathy. Does not bruise/bleed easily.  Psychiatric/Behavioral: Negative for confusion, decreased concentration and depression. The patient is not nervous/anxious.       MEDICAL HISTORY: Past Medical History:  Diagnosis Date  . [place]   Lumpectomy and rad tx's.  . Chronic back pain   . [place]  . COPD (chronic obstructive pulmonary disease) ([facility]   . Degenerative disc disease, lumbar 04/2013  . Family history of adverse reaction to anesthesia    son arrested after anesthesia  . GERD (gastroesophageal reflux disease)   . Iron deficiency anemia 05/27/2017  . Personal history of chemotherapy 2018   Colon  . Personal history of radiation therapy    Breast 2004  . Personal history of radiation therapy  2018   Colon  . Postprocedural intraabdominal abscess 09/01/2018  . [place] 04/16[place]  . Wears dentures    full upper    SURGICAL HISTORY: Past Surgical History:  Procedure Laterality Date  . APPENDECTOMY  1965  . BREAST EXCISIONAL BIOPSY Left 08/01/2003   lumpectomy rad 11/04-2/28/2005  . BREAST SURGERY    . CESAREAN SECTION     x3  . COLON SURGERY    . COLONOSCOPY W/ POLYPECTOMY    . COLONOSCOPY WITH PROPOFOL N/A 04/09/2018   Procedure: COLONOSCOPY WITH PROPOFOL;  Surgeon: TVirgel Manifold MD;  Location: [facility];  Service: Gastroenterology;  Laterality: N/A;  . COLONOSCOPY WITH PROPOFOL N/A 08/13/2019   Procedure: COLONOSCOPY WITH PROPOFOL;  Surgeon: TVirgel Manifold MD;  [place] [facility]  Service: Endoscopy;  Laterality: N/A;  . COLOSTOMY TAKEDOWN N/A 06/25/2018   Procedure: COLOSTOMY TAKEDOWN;  Surgeon: PJules Husbands MD;  Location: [facility];  Service: General;  Laterality: N/A;  . ESOPHAGOGASTRODUODENOSCOPY (EGD) WITH PROPOFOL N/A 04/04/2017   Procedure: ESOPHAGOGASTRODUODENOSCOPY (EGD) WITH PROPOFOL;  Surgeon: WLucilla Lame MD;  [facility] [facility]  Service: Endoscopy;  Laterality: N/A;  . FRACTURE SURGERY    . HIP FRACTURE SURGERY Left 01/25/2012  . LAPAROSCOPY N/A 12/07/2019   Procedure: LAPAROSCOPY DIAGNOSTIC;  Surgeon: PJules Husbands MD;  Location: [facility] OR[facility];  Service: General;  Laterality: N/A;  . LAPAROTOMY N/A 04/15/2017   Procedure: EXPLORATORY LAPAROTOMY;  Surgeon: WClayburn Pert MD;  Location: [facility] OR[facility];  Service: General;  Laterality: N/A;  . NECK SURGERY  12/2011  . PARASTOMAL HERNIA REPAIR N/A 12/03/2017   Procedure: HERNIA REPAIR PARASTOMAL;  Surgeon: PJules Husbands MD;  Location: [facility];  Service: General;  Laterality: N/A;  . PARASTOMAL HERNIA REPAIR N/A 06/25/2018   Procedure: HERNIA REPAIR PARASTOMAL;  Surgeon: Jules Husbands, MD;  Location: [facility];  Service: General;  Laterality: N/A;  .  PORTACATH PLACEMENT Right 05/21/2017   Procedure: INSERTION PORT-A-CATH;  Surgeon: Clayburn Pert, MD;  Location: [facility];  Service: General;  Laterality: Right;  . PORTACATH PLACEMENT Right 12/07/2019   Procedure: INSERTION PORT-A-CATH;  Surgeon: Jules Husbands, MD;  Location: [facility];  Service: General;  Laterality: Right;  . WRIST FRACTURE SURGERY      SOCIAL HISTORY: Social History   Socioeconomic History  . Marital status: Divorced    Spouse name: Not on file  . Number of children: 3  . Years of education: Not on file  . Highest education level: 7th grade  Occupational History  . Occupation: retired  Tobacco Use  . Smoking status: Former Smoker    Packs/day: 0.25    Years: 55.00    Pack years: 13.75    Types: Cigarettes    Quit date: 05/21/2017    Years since quitting: 2.5  . Smokeless tobacco: Never Used  . Tobacco comment: started age 61 1/2 to 1 ppd  Substance and Sexual Activity  . Alcohol use: Yes    Alcohol/week: 0.0 standard drinks    Comment: occasionally drinks beer  . Drug use: No  . Sexual activity: Never  Other Topics Concern  . Not on file  Social History Narrative   Lives at home alone   Social Determinants of Health   Financial Resource Strain: Medium Risk  . Difficulty of Paying Living Expenses: Somewhat hard  Food Insecurity:   . Worried About Charity fundraiser in the Last Year:   . Arboriculturist in the Last Year:   Transportation Needs:   . Film/video editor (Medical):   Marland Kitchen Lack of Transportation (Non-Medical):   Physical Activity: Inactive  . Days of Exercise per Week: 0 days  . Minutes of Exercise per Session: 0 min  Stress:   . Feeling of Stress :   Social Connections: Unknown  . Frequency of Communication with Friends and Family: Patient refused  . Frequency of Social Gatherings with Friends and Family: Patient refused  . Attends Religious Services: Patient refused  . Active Member of Clubs or Organizations: Patient refused   . Attends Archivist Meetings: Patient refused  . Marital Status: Patient refused  Intimate Partner Violence: Unknown  . Fear of Current or Ex-Partner: Patient refused  . Emotionally Abused: Patient refused  . Physically Abused: Patient refused  . Sexually Abused: Patient refused    FAMILY HISTORY Family History  Problem Relation Age of Onset  . Diabetes Mother        type 2  . Coronary artery disease Mother   . Deep vein thrombosis Mother   . Colon cancer Father   . Asthma Brother   . Diabetes Brother        type 2  . Breast cancer Neg Hx     ALLERGIES:  is allergic to betadine [povidone iodine]; iodine; other; and sinus formula [cholestatin].  MEDICATIONS:  Current Outpatient Medications  Medication Sig Dispense Refill  . apixaban (ELIQUIS) 5 MG TABS tablet Take 1 tablet (5 mg total) by mouth 2 (two) times daily. 60 tablet 0  . budesonide-formoterol (SYMBICORT) 80-4.5 MCG/ACT inhaler Inhale 2 puffs into the lungs 2 (two) times daily. 3 Inhaler 3  .  calcium citrate-vitamin D (CITRACAL+D) 315-200 MG-UNIT tablet Take 1 tablet by mouth daily.    . Cholecalciferol (VITAMIN D3) 50 MCG (2000 UT) TABS Take 2,000 Units by mouth daily.    Marland Kitchen docusate sodium (COLACE) 100 MG capsule Take 100 mg by mouth daily.    Marland Kitchen encorafenib (BRAFTOVI) 75 MG capsule Take 4 capsules (300 mg total) by mouth daily. 120 capsule 11  . gabapentin (NEURONTIN) 400 MG capsule TAKE 1 CAPSULE THREE TIMES DAILY (Patient taking differently: Take 600 mg by mouth 3 (three) times daily. ) 270 capsule 1  . HYDROcodone-acetaminophen (NORCO/VICODIN) 5-325 MG tablet Take 1-2 tablets by mouth every 6 (six) hours as needed for moderate pain. 20 tablet 0  . lidocaine-prilocaine (EMLA) cream Apply 1 application topically as needed. Apply to port and cover with saran wrap 1.5 hour prior to appointment 0.5 g 1  . lidocaine-prilocaine (EMLA) cream Apply to affected area once 30 g 3  . meloxicam (MOBIC) 15 MG tablet  TAKE 1 TABLET EVERY DAY AS NEEDED (Patient taking differently: Take 15 mg by mouth at bedtime. ) 90 tablet 3  . metoprolol tartrate (LOPRESSOR) 25 MG tablet Take 1 tablet (25 mg total) by mouth 2 (two) times daily. 60 tablet 0  . montelukast (SINGULAIR) 10 MG tablet TAKE 1 TABLET BY MOUTH EVERY DAY (Patient taking differently: Take 10 mg by mouth daily. ) 90 tablet 0  . Multiple Vitamins-Minerals (WOMENS MULTI VITAMIN & MINERAL PO) Take 2 tablets by mouth daily.     Marland Kitchen omeprazole (PRILOSEC) 20 MG capsule Take 1 capsule (20 mg total) by mouth daily. 90 capsule 4  . ondansetron (ZOFRAN) 8 MG tablet Take 1 tablet (8 mg total) by mouth 2 (two) times daily as needed (Nausea or vomiting). May take 30-60 minutes prior to Braftovi if nausea/vomiting occurs. 90 tablet 3  . oxycodone (OXY-IR) 5 MG capsule Take 2 capsules (10 mg total) by mouth every 4 (four) hours as needed. 30 capsule 0  . pravastatin (PRAVACHOL) 40 MG tablet TAKE 1 TABLET EVERY DAY (Patient taking differently: Take 40 mg by mouth daily. ) 90 tablet 4  . Probiotic Product (PROBIOTIC DAILY PO) Take 1 capsule by mouth daily.     . Triamcinolone Acetonide (NASACORT ALLERGY 24HR NA) Place 2 sprays into the nose daily as needed (allergies).      No current facility-administered medications for this visit.   Facility-Administered Medications Ordered in Other Visits  Medication Dose Route Frequency Provider Last Rate Last Admin  . heparin lock flush 100 unit/mL  500 Units Intravenous Once Earlie Server, MD      . sodium chloride flush (NS) 0.9 % injection 10 mL  10 mL Intravenous PRN Earlie Server, MD   10 mL at 12/24/19 0823    PHYSICAL EXAMINATION:  ECOG PERFORMANCE STATUS: 1 - Symptomatic but completely ambulatory  Vitals:   12/24/19 0841  BP: (!) 147/71  Pulse: 60  Resp: 18  Temp: (!) 96.4 F (35.8 C)   Filed Weights   12/24/19 0841  Weight: 158 lb 4.8 oz (71.8 kg)    Physical Exam  Constitutional: She is oriented to person, place, and  time. No distress.  HENT:  Head: Normocephalic and atraumatic.  Nose: Nose normal.  Mouth/Throat: Oropharynx is clear and moist. No oropharyngeal exudate.  Eyes: Pupils are equal, round, and reactive to light. EOM are normal. No scleral icterus.  Cardiovascular: Normal rate and regular rhythm.  No murmur heard. Pulmonary/Chest: Effort normal. No respiratory distress. She  has no rales. She exhibits no tenderness.  Abdominal: Soft. She exhibits no distension. There is no abdominal tenderness.  Palpable abdominal wall mass,  Colostomy  Musculoskeletal:        General: No edema. Normal range of motion.     Cervical back: Normal range of motion and neck supple.  Neurological: She is alert and oriented to person, place, and time. No cranial nerve deficit. She exhibits normal muscle tone. Coordination normal.  Skin: Skin is warm and dry. She is not diaphoretic. No erythema.  Left chest wall Mediport  Psychiatric: Affect normal.      LABORATORY DATA: I have personally reviewed the data as listed: CBC Latest Ref Rng & Units 12/10/2019 12/09/2019 12/08/2019  WBC 4.0 - 10.5 K/uL 10.6(H) 9.6 14.9(H)  Hemoglobin 12.0 - 15.0 g/dL 10.6(L) 9.8(L) 11.7(L)  Hematocrit 36.0 - 46.0 % 34.6(L) 32.7(L) 37.4  Platelets 150 - 400 K/uL 507(H) 488(H) 618(H)   CMP Latest Ref Rng & Units 12/10/2019 12/09/2019 12/08/2019  Glucose 70 - 99 mg/dL 97 99 137(H)  BUN 8 - 23 mg/dL '17 13 14  ' Creatinine 0.44 - 1.00 mg/dL 0.73 0.57 0.62  Sodium 135 - 145 mmol/L 138 138 136  Potassium 3.5 - 5.1 mmol/L 4.1 4.0 4.2  Chloride 98 - 111 mmol/L 98 100 101  CO2 22 - 32 mmol/L 32 30 25  Calcium 8.9 - 10.3 mg/dL 8.7(L) 8.3(L) 8.8(L)  Total Protein 6.5 - 8.1 g/dL - - 7.9  Total Bilirubin 0.3 - 1.2 mg/dL - - 0.6  Alkaline Phos 38 - 126 U/L - - 73  AST 15 - 41 U/L - - 17  ALT 0 - 44 U/L - - 9   RADIOGRAPHIC STUDIES: I have personally reviewed the radiological images as listed and agreed with the findings in the report. CT  ABDOMEN PELVIS WO CONTRAST  Addendum Date: 12/08/2019   ADDENDUM REPORT: 12/08/2019 16:58 ADDENDUM: Upon further review, 11 mm irregular nodular density is seen adjacent to the colonic surgical anastomosis, but may be slightly enlarged compared to prior exam. There is also noted 18 mm nodular density in anterior abdominal wall centrally which may be slightly enlarged compared to prior exam. Etiology of these is unknown, but possible malignancy or metastatic disease cannot be excluded. PET scan is recommended for further evaluation. Bilateral adrenal masses are noted most consistent with adenomas. Mild pneumoperitoneum is noted consistent with recent surgery. Aortic Atherosclerosis (ICD10-I70.0). Electronically Signed   By: Marijo Conception M.D.   On: 12/08/2019 16:58   Result Date: 12/08/2019 CLINICAL DATA:  Acute generalized abdominal pain. EXAM: CT ABDOMEN AND PELVIS WITHOUT CONTRAST TECHNIQUE: Multidetector CT imaging of the abdomen and pelvis was performed following the standard protocol without IV contrast. COMPARISON:  June 21, 2019. FINDINGS: Lower chest: Minimal right posterior basilar subsegmental atelectasis is noted. Hepatobiliary: No focal liver abnormality is seen. No gallstones, gallbladder wall thickening, or biliary dilatation. Pancreas: Unremarkable. No pancreatic ductal dilatation or surrounding inflammatory changes. Spleen: Normal in size without focal abnormality. Adrenals/Urinary Tract: Stable bilateral adrenal masses are noted most consistent with adenomas. No hydronephrosis or renal obstruction is noted. No renal or ureteral calculi are noted. Urinary bladder is unremarkable. Stomach/Bowel: Status post right hemicolectomy. The stomach is unremarkable. There is no evidence of bowel obstruction or inflammation. Vascular/Lymphatic: Aortic atherosclerosis. No enlarged abdominal or pelvic lymph nodes. Reproductive: Uterus and bilateral adnexa are unremarkable. Other: Mild pneumoperitoneum  is noted consistent with recent surgery. No ascites is noted. Musculoskeletal: No acute or  significant osseous findings. Electronically Signed: By: Marijo Conception M.D. On: 12/08/2019 13:14   DG Ribs Unilateral Left  Result Date: 10/06/2019 CLINICAL DATA:  Left rib pain, marked with BB EXAM: LEFT RIBS - 2 VIEW COMPARISON:  Radiograph 09/01/2018, CT 05/20/2018 FINDINGS: Radiopaque BB marker projects over the lateral aspect of the tenth rib. There is a minimally displaced posterolateral left seventh rib fracture. No other evident rib fractures. No pneumothorax or effusion. Chronic bronchitic and coarsened interstitial changes in the lungs are similar to prior. Portions of the right lung are collimated. The aorta is calcified. The remaining cardiomediastinal contours are unremarkable. IMPRESSION: 1. Minimally displaced posterolateral left seventh rib fracture. 2. No pneumothorax or effusion. 3. Chronic bronchitic and coarsened interstitial changes in the lungs are similar to prior. 4.  Aortic Atherosclerosis (ICD10-I70.0). Electronically Signed   By: Lovena Le M.D.   On: 10/06/2019 05:08   NM Pulmonary Perf and Vent  Result Date: 12/09/2019 CLINICAL DATA:  Positive D-dimer study. History of breast and colon carcinoma. COPD EXAM: NUCLEAR MEDICINE VENTILATION - PERFUSION LUNG SCAN VIEWS: Anterior, posterior, left lateral, right lateral, RPO, RAO, LPO, LAO-ventilation and perfusion RADIOPHARMACEUTICALS:  30.52 mCi of Tc-51mDTPA aerosol inhalation and 3.86 mCi Tc930mAA IV COMPARISON:  Chest radiograph December 08, 2019 FINDINGS: Ventilation: Radiotracer uptake on the ventilation study is homogeneous and symmetric bilaterally. No appreciable ventilation defects. Perfusion: Radiotracer uptake on the perfusion study is homogeneous and symmetric bilaterally. No appreciable perfusion defects. IMPRESSION: No appreciable ventilation or perfusion defects. Very low probability of pulmonary embolus. Electronically Signed    By: WiLowella GripII M.D.   On: 12/09/2019 14:14   DG Chest Port 1 View  Result Date: 12/08/2019 CLINICAL DATA:  Left lower quadrant abdominal pain. Surgical procedure yesterday. Recent port placement. EXAM: PORTABLE CHEST 1 VIEW COMPARISON:  CT same day. Chest radiography yesterday. FINDINGS: Power port inserted from a right internal jugular approach. Tip at the SVC RA junction. No pneumothorax. Mild bibasilar atelectasis. Small amount of pneumoperitoneum as shown on the previous CT consistent with yesterday's procedure. IMPRESSION: Mild bibasilar atelectasis. Power port tip at the SVC RA junction. No pneumothorax. Small amount of pneumoperitoneum as shown on the previous CT. Electronically Signed   By: MaNelson Chimes.D.   On: 12/08/2019 16:48   DG CHEST PORT 1 VIEW  Result Date: 12/07/2019 CLINICAL DATA:  Status post Port-A-Cath placement. EXAM: PORTABLE CHEST 1 VIEW COMPARISON:  October 05, 2019. FINDINGS: Stable cardiomediastinal silhouette. No pneumothorax pleural effusion is noted. Mild bibasilar subsegmental atelectasis or scarring is noted. Interval placement of right internal jugular Port-A-Cath with distal tip in expected position of SVC. Atherosclerosis of thoracic aorta is noted. Bony thorax is unremarkable. IMPRESSION: Interval placement of right internal jugular Port-A-Cath with distal tip in expected position of the SVC. No pneumothorax is noted. Electronically Signed   By: JaMarijo Conception.D.   On: 12/07/2019 10:28   DG C-Arm 1-60 Min-No Report  Result Date: 12/07/2019 Fluoroscopy was utilized by the requesting physician.  No radiographic interpretation.   ECHOCARDIOGRAM COMPLETE  Result Date: 12/09/2019    ECHOCARDIOGRAM REPORT   [facility] 12[facility]2019 Medical Rec #:  [id number]   Height:       64.0 in Accession #:    [id number]  Weight:       158.8 lb Date of Birth:  [birth year]   BSA:  1.774 m Patient Age:    72 years      BP:            142/78 mmHg Patient Gender: F             HR:           62 bpm. Exam Location:  [facility] Procedure: 2D Echo, Cardiac Doppler and Color Doppler Indications:    Atrial Fibrillation 427.31  History:        Patient has no prior history of Echocardiogram examinations.                 COPD.  Sonographer:    Sherrie Sport RDCS (AE) Referring Phys: [id number] [facility] Diagnosing      Serafina Royals MD Phys: IMPRESSIONS  1. Left ventricular ejection fraction, by estimation, is 50 to 55%. The left ventricle has low normal function. The left ventricle has no regional wall motion abnormalities. Left ventricular diastolic parameters were normal.  2. Right ventricular systolic function is normal. The right ventricular size is normal.  3. The mitral valve is normal in structure. Trivial mitral valve regurgitation.  4. The aortic valve is normal in structure. Aortic valve regurgitation is not visualized. FINDINGS  Left Ventricle: Left ventricular ejection fraction, by estimation, is 50 to 55%. The left ventricle has low normal function. The left ventricle has no regional wall motion abnormalities. The left ventricular internal cavity size was normal in size. There is no left ventricular hypertrophy. Left ventricular diastolic parameters were normal. Right Ventricle: The right ventricular size is normal. No increase in right ventricular wall thickness. Right ventricular systolic function is normal. Left Atrium: Left atrial size was normal in size. Right Atrium: Right atrial size was normal in size. Pericardium: There is no evidence of pericardial effusion. Mitral Valve: The mitral valve is normal in structure. Trivial mitral valve regurgitation. Tricuspid Valve: The tricuspid valve is normal in structure. Tricuspid valve regurgitation is trivial. Aortic Valve: The aortic valve is normal in structure. Aortic valve regurgitation is not visualized. Aortic valve mean gradient measures 3.5 mmHg. Aortic valve peak gradient measures 6.0 mmHg.  Aortic valve area, by VTI measures 2.29 cm. Pulmonic Valve: The pulmonic valve was normal in structure. Pulmonic valve regurgitation is not visualized. Aorta: The aortic root and ascending aorta are structurally normal, with no evidence of dilitation. IAS/Shunts: No atrial level shunt detected by color flow Doppler.  LEFT VENTRICLE PLAX 2D LVIDd:         4.08 cm  Diastology LVIDs:         2.85 cm  LV e' lateral:   9.36 cm/s LV PW:         1.18 cm  LV E/e' lateral: 11.5 LV IVS:        0.92 cm  LV e' medial:    8.81 cm/s LVOT diam:     2.00 cm  LV E/e' medial:  12.3 LV SV:         56 LV SV Index:   32 LVOT Area:     3.14 cm  RIGHT VENTRICLE RV Basal diam:  4.02 cm LEFT ATRIUM             Index       RIGHT ATRIUM           Index LA diam:        4.40 cm 2.48 cm/m  RA Area:     21.80 cm LA Vol (A2C):   81.2 ml  45.78 ml/m RA Volume:   68.60 ml  38.68 ml/m LA Vol (A4C):   71.0 ml 40.03 ml/m LA Biplane Vol: 75.0 ml 42.29 ml/m  AORTIC VALVE                   PULMONIC VALVE AV Area (Vmax):    2.21 cm    PV Vmax:        0.78 m/s AV Area (Vmean):   2.10 cm    PV Peak grad:   2.4 mmHg AV Area (VTI):     2.29 cm    RVOT Peak grad: 3 mmHg AV Vmax:           122.00 cm/s AV Vmean:          83.250 cm/s AV VTI:            0.244 m AV Peak Grad:      6.0 mmHg AV Mean Grad:      3.5 mmHg LVOT Vmax:         85.70 cm/s LVOT Vmean:        55.700 cm/s LVOT VTI:          0.178 m LVOT/AV VTI ratio: 0.73  AORTA Ao Root diam: 2.60 cm MITRAL VALVE MV Area (PHT): 3.37 cm     SHUNTS MV Decel Time: 225 msec     Systemic VTI:  0.18 m MV E velocity: 108.00 cm/s  Systemic Diam: 2.00 cm MV A velocity: 62.10 cm/s MV E/A ratio:  1.74 Serafina Royals MD Electronically signed by Serafina Royals MD Signature Date/Time: 12/09/2019/1:47:20 PM    Final    [place] CORE BIOPSY (SOFT TISSUE)  Result Date: 10/28/2019 CLINICAL DATA:  History of colon carcinoma and status post transverse colectomy and prior treatment with chemotherapy and radiation therapy.  Recent imaging has demonstrated new omental, peritoneal and abdominal wall soft tissue nodules which are hypermetabolic on PET scan and suspicious for recurrent disease. EXAM: ULTRASOUND GUIDED CORE BIOPSY OF ABDOMINAL WALL MASS MEDICATIONS: 1.0 mg IV Versed; 50 mcg IV Fentanyl Total Moderate Sedation Time: 12 minutes. The patient's level of consciousness and physiologic status were continuously monitored during the procedure by Radiology nursing. PROCEDURE: The procedure, risks, benefits, and alternatives were explained to the patient. Questions regarding the procedure were encouraged and answered. The patient understands and consents to the procedure. A time out was performed prior to initiating the procedure. Ultrasound was performed of the abdominal wall and anterior peritoneal cavity. The anterior abdominal wall was prepped with chlorhexidine in a sterile fashion, and a sterile drape was applied covering the operative field. A sterile gown and sterile gloves were used for the procedure. Local anesthesia was provided with 1% Lidocaine. Core biopsy of an anterior abdominal wall mass was performed with an 18 gauge core biopsy device. Four separate core biopsy samples were obtained and submitted in formalin. COMPLICATIONS: None. FINDINGS: Irregular lobulated hypoechoic soft tissue mass is seen involving the right lower abdominal wall and corresponding to one of the lesions that demonstrated increased metabolic activity by PET scan. This area measures approximately 3.2 x 2.1 x 2.6 cm by ultrasound. Solid tissue was obtained with biopsy. IMPRESSION: Ultrasound-guided core biopsy performed of a right lower abdominal wall mass measuring 3 cm in estimated maximal diameter. Electronically Signed   By: Aletta Edouard M.D.   On: 10/28/2019 12:02      ASSESSMENT/PLAN 74yo female who has recurrent Colon Cancer presents for follow up  Cancer Staging Malignant neoplasm of transverse  colon [facility]) Staging form: Colon  and Rectum, AJCC 8th Edition - Clinical stage from 04/15/2017: Stage IIIB (cT4a, cN1b, cM0) - Signed by Earlie Server, MD on 04/26/2017  1. Malignant neoplasm of transverse colon ([place])   2. Iron deficiency anemia due to chronic blood loss   3. Neoplasm related pain   4. Drug-induced constipation   5. Personal history of malignant neoplasm of breast   6. Encounter for antineoplastic chemotherapy    #Recurrent colon cancer with abdominal wall and mesentery nodule.    BRAF V600 E mutation and ATM mutation. Labs are reviewed and discussed with patient. Counts are acceptable to proceed with cycle 1 D1 Cetuximab and advise patient to take Encorafenib 300 mg daily.   # Nausea, advise patient to take Zofran 72m BID PRN nausea. Encorafinib may cause nausea too, she may take zofran 30-60 minutes prior to take medication.  # Iron deficiency anemia, hemoglobin has normalized. Hold off IV iron today.  # History of breast cancer, need mammogram in March 2021 # Parotid nodule with hypermetabolism. Will refer to ENT in the future. Her evaluation and treatment of colon caner takes priority.  # Leukocytosis, intermittent monocytosis, she declined bone marrow biopsy on 05/22/2018.  Continue to monitor. Treatments of colon cancer takes priority at this point.   Follow up in 1 week.   ZEarlie Server MD, PhD Hematology Oncology [facility] [place] [id number]/26/21  #Addendum, I was called by infusion nurse has initially developed itchiness 15 minutes after received cetuximab.  Her infusion reaction progressed quickly to shortness of breath, desaturation. Patient received IV Solu-Medrol, Pepcid, Benadryl with no significant improvement.  On 6 L of oxygen with saturation around 90s%.  Bilateral wheezing on physical examination. Patient was sent to emergency room for further management of severe infusion reaction.  RN called ED with information. I called patient's daughter and updated  her.   ZEarlie Server

## 2019-12-24 NOTE — ED Triage Notes (Signed)
Patient brought over from the cancer center after receiving her first dose of erbitux, within 15 minutes she began to develop hives, shortness of breath and the sensation of her throat getting tight.  She was brought emergently to the ED for evaluation with severe dyspnea on arrival with wheezes on auscultation.   At the cancer center she received pepcid 40 mg, solumedrol 250 mg, and benadryl 75 mg, all IV.

## 2019-12-24 NOTE — ED Notes (Signed)
Wheezing is notably decreased from initial assessment. Pt coughing up sputum.

## 2019-12-24 NOTE — ED Provider Notes (Signed)
[facility] Emergency Department Provider Note   ____________________________________________   First MD Initiated Contact with Patient 12/24/19 1121     (approximate)  I have reviewed the triage vital signs and the nursing notes.   HISTORY  Chief Complaint Allergic Reaction    HPI Kimberly [name] is a 75 y.o. female with past medical history of COPD, recurrent colon cancer, remote breast cancer presents to the ED for allergic reaction.  History is limited due to patient's respiratory distress.  Patient had arrived for her first infusion of cetuximab earlier this morning and had been feeling well prior to that.  Approximately 15 minutes after starting the infusion, patient began to feel itchy with red rash around her face and neck.  She developed increased difficulty breathing and complained of feeling like her throat was swelling.  She received an IV fluid bolus as well as IV Solu-Medrol, Benadryl, and Pepcid prior to being brought to the ED for further evaluation.  Patient arrives to the ED in respiratory distress.        Past Medical History:  Diagnosis Date  . Breast cancer, left ([place]) 2004   Lumpectomy and rad tx's.  . Chronic back pain   . Colon cancer ([place]) 2018  . COPD (chronic obstructive pulmonary disease) ([place])   . Degenerative disc disease, lumbar 04/2013  . Family history of adverse reaction to anesthesia    son arrested after anesthesia  . GERD (gastroesophageal reflux disease)   . Iron deficiency anemia 05/27/2017  . Personal history of chemotherapy 2018   Colon  . Personal history of radiation therapy    Breast 2004  . Personal history of radiation therapy 2018   Colon  . Postprocedural intraabdominal abscess 09/01/2018  . Small bowel obstruction ([place]) 04/16/2017  . Wears dentures    full upper    Patient Active Problem List   Diagnosis Date Noted  . Encounter for antineoplastic chemotherapy 12/24/2019  . Infusion reaction  12/24/2019  . Atrial fibrillation with RVR ([place]) 12/08/2019  . Leukocytosis 12/08/2019  . Thrombocytosis ([place]) 12/08/2019  . Iron deficiency anemia due to chronic blood loss 12/08/2019  . Nocturnal hypoxemia 12/08/2019  . Goals of care, counseling/discussion 11/30/2019  . Personal history of colon cancer   . Intestinal bypass or anastomosis status   . Colonic edema   . COPD (chronic obstructive pulmonary disease) ([place])   . Dependence on nocturnal oxygen therapy 08/12/2018  . Clostridium difficile colitis 07/31/2018  . S/P colostomy takedown 06/25/2018  . Parastomal hernia without obstruction or gangrene   . Status post colon resection   . Colostomy status ([place])   . Colostomy prolapse ([place])   . Iron deficiency anemia 05/27/2017  . Port-A-Cath in place   . Left peroneal vein thrombosis ([place])   . Malignant neoplasm of transverse colon ([place]) 04/26/2017  . Generalized abdominal pain   . Loss of weight   . Gastritis without bleeding   . Absence of bladder continence 04/01/2017  . Asthmatic breathing 04/01/2017  . OP (osteoporosis) 02/11/2017  . Skin lesion 02/11/2017  . Breast swelling 02/11/2017  . Narrowing of intervertebral disc space 02/11/2017  . Family history of colon cancer 02/11/2017  . Foot pain 02/11/2017  . Arthralgia of hip 02/11/2017  . Personal history of malignant neoplasm of breast 02/11/2017  . Eczema intertrigo 02/11/2017  . Neuropathy 02/11/2017  . Osteoarthrosis, unspecified whether generalized or localized, involving lower leg 02/11/2017  . Coronary atherosclerosis 01/29/2017  . Smoking greater  than 30 pack years 01/10/2017  . Vitamin D deficiency 08/21/2016  . Hyperlipidemia 11/10/2015  . Chronic back pain 05/26/2015  . DDD (degenerative disc disease), lumbosacral 05/26/2015  . History of colon polyps 04/27/2007    Past Surgical History:  Procedure Laterality Date  . APPENDECTOMY  1965  . BREAST EXCISIONAL BIOPSY Left 08/01/2003   lumpectomy rad  11/04-2/28/2005  . BREAST SURGERY    . CESAREAN SECTION     x3  . COLON SURGERY    . COLONOSCOPY W/ POLYPECTOMY    . COLONOSCOPY WITH PROPOFOL N/A 04/09/2018   Procedure: COLONOSCOPY WITH PROPOFOL;  Surgeon: Virgel Manifold, MD;  Location: [facility];  Service: Gastroenterology;  Laterality: N/A;  . COLONOSCOPY WITH PROPOFOL N/A 08/13/2019   Procedure: COLONOSCOPY WITH PROPOFOL;  Surgeon: Virgel Manifold, MD;  Location: [place];  Service: Endoscopy;  Laterality: N/A;  . COLOSTOMY TAKEDOWN N/A 06/25/2018   Procedure: COLOSTOMY TAKEDOWN;  Surgeon: Jules Husbands, MD;  Location: [facility];  Service: General;  Laterality: N/A;  . ESOPHAGOGASTRODUODENOSCOPY (EGD) WITH PROPOFOL N/A 04/04/2017   Procedure: ESOPHAGOGASTRODUODENOSCOPY (EGD) WITH PROPOFOL;  Surgeon: [name], MD;  Location: [place];  Service: Endoscopy;  Laterality: N/A;  . FRACTURE SURGERY    . HIP FRACTURE SURGERY Left 01/25/2012  . LAPAROSCOPY N/A 12/07/2019   Procedure: LAPAROSCOPY DIAGNOSTIC;  Surgeon: Jules Husbands, MD;  Location: [facility];  Service: General;  Laterality: N/A;  . LAPAROTOMY N/A 04/15/2017   Procedure: EXPLORATORY LAPAROTOMY;  Surgeon: Clayburn Pert, MD;  Location: [facility];  Service: General;  Laterality: N/A;  . NECK SURGERY  12/2011  . PARASTOMAL HERNIA REPAIR N/A 12/03/2017   Procedure: HERNIA REPAIR PARASTOMAL;  Surgeon: Jules Husbands, MD;  Location: [facility];  Service: General;  Laterality: N/A;  . PARASTOMAL HERNIA REPAIR N/A 06/25/2018   Procedure: HERNIA REPAIR PARASTOMAL;  Surgeon: Jules Husbands, MD;  Location: [facility];  Service: General;  Laterality: N/A;  . PORTACATH PLACEMENT Right 05/21/2017   Procedure: INSERTION PORT-A-CATH;  Surgeon: Clayburn Pert, MD;  Location: [facility];  Service: General;  Laterality: Right;  . PORTACATH PLACEMENT Right 12/07/2019   Procedure: INSERTION PORT-A-CATH;  Surgeon: Jules Husbands, MD;  Location: [facility];  Service: General;   Laterality: Right;  . WRIST FRACTURE SURGERY      Prior to Admission medications   Medication Sig Start Date End Date Taking? Authorizing Provider  apixaban (ELIQUIS) 5 MG TABS tablet Take 1 tablet (5 mg total) by mouth 2 (two) times daily. 12/10/19 01/09/20  Sidney Ace, MD  budesonide-formoterol (SYMBICORT) 80-4.5 MCG/ACT inhaler Inhale 2 puffs into the lungs 2 (two) times daily. 06/09/18   Laverle Hobby, MD  calcium citrate-vitamin D (CITRACAL+D) 315-200 MG-UNIT tablet Take 1 tablet by mouth daily.    [provider]  Cholecalciferol (VITAMIN D3) 50 MCG (2000 UT) TABS Take 2,000 Units by mouth daily. 11/05/19   Birdie Sons, MD  encorafenib (BRAFTOVI) 75 MG capsule Take 4 capsules (300 mg total) by mouth daily. Patient not taking: Reported on 12/24/2019 12/15/19   Earlie Server, MD  gabapentin (NEURONTIN) 400 MG capsule TAKE 1 CAPSULE THREE TIMES DAILY Patient taking differently: Take 600 mg by mouth 3 (three) times daily.  11/08/19   Earlie Server, MD  HYDROcodone-acetaminophen (NORCO/VICODIN) 5-325 MG tablet Take 1-2 tablets by mouth every 6 (six) hours as needed for moderate pain. 12/07/19   Pabon, Diego F, MD  lidocaine-prilocaine (EMLA) cream Apply to affected area once  12/15/19   Earlie Server, MD  meloxicam (MOBIC) 15 MG tablet TAKE 1 TABLET EVERY DAY AS NEEDED Patient taking differently: Take 15 mg by mouth at bedtime.  05/20/19   Birdie Sons, MD  metoprolol tartrate (LOPRESSOR) 25 MG tablet Take 1 tablet (25 mg total) by mouth 2 (two) times daily. 12/10/19 01/09/20  Sidney Ace, MD  montelukast (SINGULAIR) 10 MG tablet TAKE 1 TABLET BY MOUTH EVERY DAY Patient taking differently: Take 10 mg by mouth daily.  03/30/19   Laverle Hobby, MD  Multiple Vitamins-Minerals (WOMENS MULTI VITAMIN & MINERAL PO) Take 2 tablets by mouth daily.     [provider]  omeprazole (PRILOSEC) 20 MG capsule Take 1 capsule (20 mg total) by mouth daily. 03/10/19   Birdie Sons,  MD  ondansetron (ZOFRAN) 8 MG tablet Take 1 tablet (8 mg total) by mouth 2 (two) times daily as needed (Nausea or vomiting). May take 30-60 minutes prior to [place] if nausea/vomiting occurs. 12/23/19   Earlie Server, MD  oxycodone (OXY-IR) 5 MG capsule Take 2 capsules (10 mg total) by mouth every 4 (four) hours as needed. 12/08/19   Pabon, Riley, MD  pravastatin (PRAVACHOL) 40 MG tablet TAKE 1 TABLET EVERY DAY Patient taking differently: Take 40 mg by mouth daily.  05/20/19   Birdie Sons, MD  Probiotic Product (PROBIOTIC DAILY PO) Take 1 capsule by mouth daily.     [provider]  senna-docusate (SENOKOT-S) 8.6-50 MG tablet Take 2 tablets by mouth daily. 12/24/19   Earlie Server, MD  Triamcinolone Acetonide (NASACORT ALLERGY 24HR NA) Place 2 sprays into the nose daily as needed (allergies).     [provider]    Allergies Erbitux [cetuximab], Betadine [povidone iodine], Iodine, Other, and Sinus formula [cholestatin]  Family History  Problem Relation Age of Onset  . Diabetes Mother        type 2  . Coronary artery disease Mother   . Deep vein thrombosis Mother   . Colon cancer Father   . Asthma Brother   . Diabetes Brother        type 2  . Breast cancer Neg Hx     Social History Social History   Tobacco Use  . Smoking status: Former Smoker    Packs/day: 0.25    Years: 55.00    Pack years: 13.75    Types: Cigarettes    Quit date: 05/21/2017    Years since quitting: 2.5  . Smokeless tobacco: Never Used  . Tobacco comment: started age 36 1/2 to 1 ppd  Substance Use Topics  . Alcohol use: Yes    Alcohol/week: 0.0 standard drinks    Comment: occasionally drinks beer  . Drug use: No    Review of Systems Unable to obtain secondary to respiratory distress ____________________________________________   PHYSICAL EXAM:  VITAL SIGNS: ED Triage Vitals [12/24/19 1116]  Enc Vitals Group     BP (!) 131/104     Pulse Rate (!) 50     Resp (!) 35     Temp       Temp src      SpO2 91 %     Weight      Height      Head Circumference      Peak Flow      Pain Score      Pain Loc      Pain Edu?      Excl. in [place]?  Constitutional: Awake and alert. Eyes: Conjunctivae are normal. Head: Atraumatic. Nose: No congestion/rhinnorhea. Mouth/Throat: Mucous membranes are moist. Neck: Normal ROM Cardiovascular: Tachycardic, regular rhythm. Grossly normal heart sounds. Respiratory: Moderate respiratory distress with tachypnea and accessory muscle use.  Lungs with diffuse wheezing. Gastrointestinal: Soft and nontender. No distention. Genitourinary: deferred Musculoskeletal: No lower extremity tenderness nor edema. Neurologic:  Normal speech and language. No gross focal neurologic deficits are appreciated. Skin:  Skin is warm, dry and intact.  Diffuse redness noted over face and neck. Psychiatric: Mood and affect are normal. Speech and behavior are normal.  ____________________________________________   LABS (all labs ordered are listed, but only abnormal results are displayed)  Labs Reviewed  BASIC METABOLIC PANEL - Abnormal; Notable for the following components:      Result Value   Glucose, Bld 125 (*)    Calcium 8.6 (*)    All other components within normal limits  CBC WITH DIFFERENTIAL/PLATELET - Abnormal; Notable for the following components:   RDW 18.7 (*)    Platelets 418 (*)    Lymphs Abs 5.4 (*)    All other components within normal limits  SARS CORONAVIRUS 2 (TAT 6-24 HRS)  TROPONIN I (HIGH SENSITIVITY)  TROPONIN I (HIGH SENSITIVITY)   ____________________________________________  EKG  ED ECG REPORT I, Blake Divine, the attending physician, personally viewed and interpreted this ECG.   Date: 12/24/2019  EKG Time: 11:30  Rate: 108  Rhythm: sinus tachycardia  Axis: Normal  Intervals:none  ST&T Change: None   PROCEDURES  Procedure(s) performed (including Critical Care):  .Critical Care Performed by: Blake Divine, MD Authorized by: Blake Divine, MD   Critical care provider statement:    Critical care time (minutes):  45   Critical care time was exclusive of:  Separately billable procedures and treating other patients and teaching time   Critical care was necessary to treat or prevent imminent or life-threatening deterioration of the following conditions:  Respiratory failure   Critical care was time spent personally by me on the following activities:  Discussions with consultants, evaluation of patient's response to treatment, examination of patient, ordering and performing treatments and interventions, ordering and review of laboratory studies, ordering and review of radiographic studies, pulse oximetry, re-evaluation of patient's condition, obtaining history from patient or surrogate and review of old charts   I assumed direction of critical care for this patient from another provider in my specialty: no       ____________________________________________   INITIAL IMPRESSION / ASSESSMENT AND PLAN / ED COURSE       74 year old female with history of recurrent colon cancer presents to the ED after apparent allergic reaction to initial dose of chemotherapy.  Patient noted to have itching and rash at infusion center, shortly afterwards developed shortness of breath and sensation that her throat is closing.  She arrives to the ED in respiratory distress with associated wheezing, was immediately given IM epinephrine and multiple breathing treatments.  She continued to have difficulty breathing but when asked if she would consider intubation to be put on a ventilator, patient adamantly refused.  She had gradual improvement in her respiratory effort over the next 1 to 2 hours, but with attempts to wean off oxygen she quickly desaturates into the 80s.  There is likely significant component of bronchospasm and we will admit for further observation following apparent anaphylactic reaction.       ____________________________________________   FINAL CLINICAL IMPRESSION(S) / ED DIAGNOSES  Final diagnoses:  Anaphylaxis, initial encounter  COPD exacerbation ([place])  Shortness of breath     ED Discharge Orders    None       Note:  This document was prepared using Dragon voice recognition software and may include unintentional dictation errors.   Blake Divine, MD 12/24/19 [phone]

## 2019-12-24 NOTE — ED Notes (Signed)
Daughter went home.  

## 2019-12-24 NOTE — Telephone Encounter (Signed)
Confirmed with patient at today's appt that she does NOT want rx sent to [facility].  Rx was received at [facility] and will cx rx with [employer].

## 2019-12-24 NOTE — ED Notes (Signed)
Pt speaking with daughter at this time.  

## 2019-12-24 NOTE — ED Notes (Signed)
Per ED MD, oxygen turned off for trial weaning.

## 2019-12-24 NOTE — ED Notes (Signed)
0.3 mg epinephrine given IM per MD verbal order.

## 2019-12-24 NOTE — Addendum Note (Signed)
Addended by: Earlie Server on: 12/24/2019 10:55 AM   Modules accepted: Orders

## 2019-12-24 NOTE — ED Notes (Signed)
Report to Lattie Haw, Therapist, sports. Transportation requested.

## 2019-12-24 NOTE — ED Notes (Signed)
Lung sounds clear bilaterally

## 2019-12-24 NOTE — ED Notes (Signed)
Audible wheezes noted throughout, pt's breathing is labored.

## 2019-12-24 NOTE — ED Notes (Addendum)
Coarse crackles noted in lung bases bilaterally. Neb treatment in progress.

## 2019-12-25 DIAGNOSIS — T782XXA Anaphylactic shock, unspecified, initial encounter: Secondary | ICD-10-CM | POA: Diagnosis not present

## 2019-12-25 LAB — CBC
HCT: 38.6 % (ref 36.0–46.0)
Hemoglobin: 11.9 g/dL — ABNORMAL LOW (ref 12.0–15.0)
MCH: 27 pg (ref 26.0–34.0)
MCHC: 30.8 g/dL (ref 30.0–36.0)
MCV: 87.7 fL (ref 80.0–100.0)
Platelets: 335 10*3/uL (ref 150–400)
RBC: 4.4 MIL/uL (ref 3.87–5.11)
RDW: 18.4 % — ABNORMAL HIGH (ref 11.5–15.5)
WBC: 13.7 10*3/uL — ABNORMAL HIGH (ref 4.0–10.5)
nRBC: 0 % (ref 0.0–0.2)

## 2019-12-25 LAB — BASIC METABOLIC PANEL
Anion gap: 9 (ref 5–15)
BUN: 16 mg/dL (ref 8–23)
CO2: 27 mmol/L (ref 22–32)
Calcium: 8.9 mg/dL (ref 8.9–10.3)
Chloride: 102 mmol/L (ref 98–111)
Creatinine, Ser: 0.74 mg/dL (ref 0.44–1.00)
GFR calc Af Amer: >60 mL/min (ref >60)
GFR calc non Af Amer: >60 mL/min (ref >60)
Glucose, Bld: 143 mg/dL — ABNORMAL HIGH (ref 70–99)
Potassium: 4.8 mmol/L (ref 3.5–5.1)
Sodium: 138 mmol/L (ref 135–145)

## 2019-12-25 LAB — CEA: CEA: 15.5 ng/mL — ABNORMAL HIGH (ref 0.0–4.7)

## 2019-12-25 LAB — SARS CORONAVIRUS 2 (TAT 6-24 HRS): SARS Coronavirus 2: NEGATIVE

## 2019-12-25 LAB — GLUCOSE, CAPILLARY: Glucose-Capillary: 122 mg/dL — ABNORMAL HIGH (ref 70–99)

## 2019-12-25 MED ORDER — BUDESONIDE-FORMOTEROL FUMARATE 80-4.5 MCG/ACT IN AERO: 2.0000 | INHALATION_SPRAY | Freq: Two times a day (BID) | RESPIRATORY_TRACT | 3 refills | Status: AC

## 2019-12-25 MED ORDER — SODIUM CHLORIDE 0.9% FLUSH
10.0000 mL | Freq: Two times a day (BID) | INTRAVENOUS | Status: DC
  Administered 2019-12-25 (×2): 10 mL

## 2019-12-25 MED ORDER — EPINEPHRINE 0.3 MG/0.3ML IJ SOAJ: 0.3000 mg | INTRAMUSCULAR | 0 refills | Status: AC | PRN

## 2019-12-25 MED ORDER — PREDNISONE 10 MG PO TABS: ORAL_TABLET | ORAL | 0 refills | Status: DC

## 2019-12-25 MED ORDER — CHLORHEXIDINE GLUCONATE CLOTH 2 % EX PADS
6.0000 | MEDICATED_PAD | Freq: Every day | CUTANEOUS | Status: DC
  Administered 2019-12-25: 6 via TOPICAL

## 2019-12-25 MED ORDER — HEPARIN SOD (PORK) LOCK FLUSH 10 UNIT/ML IV SOLN
10.0000 [IU] | Freq: Once | INTRAVENOUS | Status: AC
  Administered 2019-12-25: 10 [IU] via INTRAVENOUS
  Filled 2019-12-25: qty 1

## 2019-12-25 MED ORDER — DIPHENHYDRAMINE HCL 25 MG PO TABS: 50.0000 mg | ORAL_TABLET | Freq: Every evening | ORAL | 0 refills | Status: DC | PRN

## 2019-12-25 MED ORDER — SODIUM CHLORIDE 0.9% FLUSH: 10.0000 mL | INTRAVENOUS | Status: DC | PRN

## 2019-12-25 NOTE — Care Management Obs Status (Signed)
[facility] NOTIFICATION   Patient Details  Name: Kimberly [name] MRN: [id number] Date of Birth: 1945-10-26   Medicare Observation Status Notification Given:  Yes  RNCM provided as patient was being discharge.  Marshell Garfinkel, RN 12/25/2019, 1:17 PM

## 2019-12-25 NOTE — Discharge Planning (Addendum)
IV an tele removed.  RN assessment and VS revealed stability for DC to home.  Flushed Port with Heparin and De-accessed per protocol.  Discharge papers given, explained and educated.  SPO2 96% on RA and with activity.  No O2 needs during the day - able to return home without O2 during awake hours (which is baseline for patient. Informed of suggested FU appt with PCP and agreed to contact to set up - since discharging on a Sat.  Scripts sent to Pharm, per patient request.  Once ready, wheeled to front and family transported home via car.

## 2019-12-25 NOTE — Discharge Instructions (Signed)
Anaphylactic Reaction, Adult An anaphylactic reaction (anaphylaxis) is a sudden, severe allergic reaction by the body's disease-fighting system (immune system). Anaphylaxis can be life-threatening. This condition must be treated right away. Sometimes a person may need to be treated in the hospital. What are the causes? This condition is caused by exposure to a substance that you are allergic to (allergen). In response to this exposure, the body releases proteins (antibodies) and other compounds, such as histamine, into the bloodstream. This causes swelling in certain tissues and loss of blood pressure to important areas, such as the heart and lungs. Common allergens that can cause anaphylaxis include:  Foods, especially peanuts, wheat, shellfish, milk, and eggs.  Medicines.  Insect bites or stings.  Blood or parts of blood received for treatment (transfusions).  Chemicals, such as dyes, latex, and contrast material that is used for medical tests. What increases the risk? This condition is more likely to occur in people who:  Have allergies.  Have had anaphylaxis before.  Have a family history of anaphylaxis.  Have certain medical conditions, including asthma and eczema. What are the signs or symptoms? Symptoms of anaphylaxis may include:  Feeling warm in the face (flushed). This may include redness.  Itchy, red, swollen areas of skin (hives).  Swelling of the eyes, lips, face, mouth, tongue, or throat.  Difficulty breathing, speaking, or swallowing.  Noisy breathing (wheezing).  Dizziness or light-headedness.  Fainting.  Pain or cramping in the abdomen.  Vomiting.  Diarrhea. How is this diagnosed? This condition is diagnosed based on:  Your symptoms.  A physical exam.  Blood tests.  Recent history of exposure to allergens. How is this treated? If you think you are having an anaphylactic reaction, you should do the following right away:  Give yourself an  epinephrine injection using what is commonly called an auto-injector "pen" (pre-filled automatic epinephrine injection device). Your health care provider will teach you how to use an auto-injector pen.  Call for emergency help. If you use a pen, you must still get emergency medical treatment in the hospital. Treatment in the hospital may include: ? Medicines to help:  Tighten your blood vessels (epinephrine).  Relieve itching and hives (antihistamines).  Reduce swelling (corticosteroids). ? Oxygen therapy to help you breathe. ? IV fluids to keep you hydrated. Follow these instructions at home: Safety  Always keep an auto-injector pen near you. This can be lifesaving if you have a severe anaphylactic reaction. Use your auto-injector pen as told by your health care provider.  Do not drive after an anaphylactic reaction until your health care provider approves.  Make sure that you, the members of your household, and your employer know: ? What you are allergic to, so it can be avoided. ? How to use an auto-injector pen to give you an epinephrine injection.  Replace your epinephrine immediately after you use your auto-injector pen. This is important if you have another reaction.  If told by your health care provider, wear a medical alert bracelet or necklace that states your allergy.  Learn the signs of anaphylaxis so that you can recognize and treat it right away.  Work with your health care providers to make an anaphylaxis plan. Preparation is important. General instructions  If you have hives or rash: ? Use an over-the-counter antihistamine as told by your health care provider. ? Apply cold, wet cloths (cold compresses) to your skin or take baths or showers in cool water. Avoid hot water.  Take over-the-counter and prescription medicines only   as told by your health care provider.  Tell all your health care providers that you have an allergy.  Keep all follow-up visits as told by  your health care provider. This is important. How is this prevented?  Avoid allergens that have caused an anaphylactic reaction in the past.  When you are at a restaurant, tell your server that you have an allergy. If you are not sure whether a menu item contains an ingredient that you are allergic to, ask your server. Where to find more information  [employer] of [employer]hma and Immunology: aaaai.org  American Academy of Pediatrics: healthychildren.org Get help right away if:  You develop symptoms of an allergic reaction. You may notice them soon after you are exposed to a substance. Symptoms may include: ? Flushed skin. ? Hives. ? Swelling of the eyes, lips, face, mouth, tongue, or throat. ? Difficulty breathing, speaking, or swallowing. ? Wheezing. ? Dizziness or light-headedness. ? Fainting. ? Pain or cramping in the abdomen. ? Vomiting. ? Diarrhea.  You used epinephrine. You need more medical care even if the medicine seems to be working. This is important because anaphylaxis may happen again within 72 hours (rebound anaphylaxis). You may need more doses of epinephrine. These symptoms may represent a serious problem that is an emergency. Do not wait to see if the symptoms will go away. Do the following right away:  Use the auto-injector pen as you have been instructed.  Get medical help. Call your local emergency services (911 in the [place] Do not drive yourself to the hospital. Summary  An anaphylactic reaction (anaphylaxis) is a sudden, severe allergic reaction by the body's disease-fighting system (immune system).  This condition can be life-threatening. If you have an anaphylactic reaction, get medical help right away.  Your health care provider may teach you how to use an auto-injector "pen" (pre-filled automatic epinephrine injection device) to give yourself a shot.  Always keep an auto-injector pen with you. This could save your life. Use it as told by  your health care provider.  If you use epinephrine, you must still get emergency medical treatment, even if the medicine seems to be working. This information is not intended to replace advice given to you by your health care provider. Make sure you discuss any questions you have with your health care provider. Document Revised: 01/08/2018 Document Reviewed: 01/08/2018 [employer] Patient Education  2020 [employer]  

## 2019-12-25 NOTE — Discharge Summary (Signed)
[facility] Discharge Summary   Patient: Kimberly [name] [id number]  PCP: Birdie Sons, MD  Date of admission: 12/24/2019   Date of discharge: 12/25/2019      Discharge Diagnoses:  Principal diagnosis Anaphylactic reaction to chemotherapy agent.  Active Problems:   COPD exacerbation ([place])   Anaphylaxis   Admitted From: Home Disposition:  Home   Recommendations for Outpatient Follow-up:  1. PCP: Follow-up with PCP in 1 week 2. Follow up LABS/TEST: None  Follow-up Information    [name], Kimberly Peri, MD. Schedule an appointment as soon as possible for a visit in 1 week(s).   Specialty: Family Medicine Contact information: [address] [place] [address] [phone]          Diet recommendation: Cardiac diet  Activity: The patient is advised to gradually reintroduce usual activities, as tolerated  Discharge Condition: stable  Code Status: DNR   History of present illness: As per the H and P dictated on admission, "Kimberly [name] is a 74 y.o. female with medical history significant of colon cancer, chronic back pain, COPD uses oxygen at night, and paroxysmal A. fib on Eliquis, with recurrence of her colon cancer and she was being started today for chemotherapy with erbitux, within 15 minutes she began to develop hives, shortness of breath and the sensation of her throat getting tight. She was brought emergently to the ED for evaluation.  Patient had no new complaints prior to going to cancer center to get her infusion.  15 minutes into infusion she started developing hives and feeling of choking.  She was given Solu-Medrol and Benadryl at the infusion center and she was sent to ED.  She received EpiPen along with another round of Solu-Medrol and Benadryl along with multiple breathing treatments.  She was feeling better when seen.  Able to speak in full sentences.  Choking sensation has been resolved.  She was able to swallow and would like to try  some food.  No nausea or vomiting, no chest pain or shortness of breath, no recent change in her appetite, weight or bowel habits.  No urinary symptoms."  Hospital Course:  Summary of her active problems in the hospital is as following.   Anaphylaxis.  Her symptoms are consistent with anaphylaxis reaction secondary to cetuximab,  this was her first infusion. Appears improving, continue to have some wheeze. -Treated with Solu-Medrol 60 mg daily. -Treated with Benadryl 12.5 mg every 4 hourly as needed. -EpiPen was prescribed on discharge  COPD exacerbation secondary to anaphylaxis. Patient with bilateral wheeze and requiring 3 L to saturate in mid 90s.  Normally uses 2 L at bedtime.  Seems improving. -Continue home inhalers. -Continue prednisone taper -Continue home inhalers and Singulair.  Paroxysmal A. Fib.  In sinus rhythm at this time -Continue home dose of metoprolol and Eliquis.  Patient was ambulatory without any assistance. On the day of the discharge the patient's vitals were stable, and no other acute medical condition were reported by patient. the patient was felt safe to be discharge at Home with no therapy needed on discharge.  Consultants: none Procedures: none  Discharge Exam: General: Appear in no distress, no Rash; Oral Mucosa Clear, moist. Cardiovascular: S1 and S2 Present, no Murmur, Respiratory: normal respiratory effort, Bilateral Air entry present and no Crackles, Occasional wheezes Abdomen: Bowel Sound present, Soft and no tenderness, no hernia Extremities: no Pedal edema, no calf tenderness Neurology: alert and oriented to time, place, and person affect appropriate.  Filed  Weights   12/24/19 2039 12/25/19 0400  Weight: 71.2 kg 71.2 kg   Vitals:   12/25/19 1148 12/25/19 1300  BP: 135/62   Pulse: 64   Resp: 19   Temp: 98.9 F (37.2 C)   SpO2: 95% 96%    DISCHARGE MEDICATION: Allergies as of 12/25/2019      Reactions   Erbitux [cetuximab]  Anaphylaxis   Betadine [povidone Iodine] Other (See Comments)   Topical iodine she states "if you put it in an open sore it will cause a eating ulcer."   Iodine Other (See Comments)   Patient states "it gives me infections"   Other Nausea And Vomiting   Antihystamines   Sinus Formula [cholestatin] Nausea Only      Medication List    TAKE these medications   apixaban 5 MG Tabs tablet Commonly known as: ELIQUIS Take 1 tablet (5 mg total) by mouth 2 (two) times daily.   budesonide-formoterol 80-4.5 MCG/ACT inhaler Commonly known as: SYMBICORT Inhale 2 puffs into the lungs 2 (two) times daily.   calcium citrate-vitamin D 315-200 MG-UNIT tablet Commonly known as: CITRACAL+D Take 1 tablet by mouth daily.   diphenhydrAMINE 25 MG tablet Commonly known as: BENADRYL Take 2 tablets (50 mg total) by mouth at bedtime as needed for itching or allergies.   encorafenib 75 MG capsule Commonly known as: BRAFTOVI Take 4 capsules (300 mg total) by mouth daily.   EPINEPHrine 0.3 mg/0.3 mL Soaj injection Commonly known as: EPI-PEN Inject 0.3 mLs (0.3 mg total) into the muscle as needed for anaphylaxis.   gabapentin 400 MG capsule Commonly known as: NEURONTIN TAKE 1 CAPSULE THREE TIMES DAILY What changed: See the new instructions.   HYDROcodone-acetaminophen 5-325 MG tablet Commonly known as: NORCO/VICODIN Take 1-2 tablets by mouth every 6 (six) hours as needed for moderate pain.   lidocaine-prilocaine cream Commonly known as: EMLA Apply to affected area once   meloxicam 15 MG tablet Commonly known as: MOBIC TAKE 1 TABLET EVERY DAY AS NEEDED What changed: See the new instructions.   metoprolol tartrate 25 MG tablet Commonly known as: LOPRESSOR Take 1 tablet (25 mg total) by mouth 2 (two) times daily.   omeprazole 20 MG capsule Commonly known as: PRILOSEC Take 1 capsule (20 mg total) by mouth daily.   ondansetron 8 MG tablet Commonly known as: Zofran Take 1 tablet (8 mg  total) by mouth 2 (two) times daily as needed (Nausea or vomiting). May take 30-60 minutes prior to Braftovi if nausea/vomiting occurs.   pravastatin 40 MG tablet Commonly known as: PRAVACHOL TAKE 1 TABLET EVERY DAY   predniSONE 10 MG tablet Commonly known as: DELTASONE Take 40mg  daily for 3days,Take 30mg  daily for 3days,Take 20mg  daily for 3days,Take 10mg  daily for 3days, then stop   PROBIOTIC DAILY PO Take 1 capsule by mouth daily.   senna-docusate 8.6-50 MG tablet Commonly known as: Senokot-S Take 2 tablets by mouth daily.   Vitamin D3 50 MCG (2000 UT) Tabs Take 2,000 Units by mouth daily.   WOMENS MULTI VITAMIN & MINERAL PO Take 2 tablets by mouth daily.      Allergies  Allergen Reactions   Erbitux [Cetuximab] Anaphylaxis   Betadine [Povidone Iodine] Other (See Comments)    Topical iodine she states "if you put it in an open sore it will cause a eating ulcer."   Iodine Other (See Comments)    Patient states "it gives me infections"   Other Nausea And Vomiting    Antihystamines  Sinus Formula [Cholestatin] Nausea Only   Discharge Instructions    Diet - low sodium heart healthy   Complete by: As directed    Discharge instructions   Complete by: As directed    It is important that you read the given instructions as well as go over your medication list with RN to help you understand your care after this hospitalization.  Please follow-up with PCP in 1-2 weeks.  Please note that NO REFILLS for any discharge medications will be authorized once you are discharged, as it is imperative that you return to your primary care physician (or establish a relationship with a primary care physician if you do not have one) for your aftercare needs so that they can reassess your need for medications and monitor your lab values.  Please request your primary care physician to go over all Hospital Tests and Procedure/Radiological results at the follow up. Please get all Hospital  records sent to your PCP by signing hospital release before you go home.   Do not take more than prescribed Pain, Sleep and Anxiety Medications.  You were cared for by a hospitalist during your hospital stay. If you have any questions about your discharge medications or the care you received while you were in the hospital after you are discharged, you can call the unit @UNIT @ you were admitted to and ask to speak with the hospitalist Berle Mull. Ask for Hospitalist on call if the hospitalist that took care of you is not available.   Once you are discharged, your primary care physician will handle any further medical issues.  You Must read complete instructions/literature along with all the possible adverse reactions/side effects for all the Medicines you take and that have been prescribed to you. Take any new Medicines after you have completely understood and accept all the possible adverse reactions/side effects.  If you have smoked or chewed Tobacco in the last 2 yrs please STOP smoking If you drink alcohol, please safely reduce the use. Do not drive, operating heavy machinery, perform activities at heights, swimming or participation in water activities or provide baby sitting services under influence.  Wear Seat belts while driving.   Increase activity slowly   Complete by: As directed       The results of significant diagnostics from this hospitalization (including imaging, microbiology, ancillary and laboratory) are listed below for reference.    Significant Diagnostic Studies: CT ABDOMEN PELVIS WO CONTRAST  Addendum Date: 12/08/2019   ADDENDUM REPORT: 12/08/2019 16:58 ADDENDUM: Upon further review, 11 mm irregular nodular density is seen adjacent to the colonic surgical anastomosis, but may be slightly enlarged compared to prior exam. There is also noted 18 mm nodular density in anterior abdominal wall centrally which may be slightly enlarged compared to prior exam. Etiology of these  is unknown, but possible malignancy or metastatic disease cannot be excluded. PET scan is recommended for further evaluation. Bilateral adrenal masses are noted most consistent with adenomas. Mild pneumoperitoneum is noted consistent with recent surgery. Aortic Atherosclerosis (ICD10-I70.0). Electronically Signed   By: Marijo Conception M.D.   On: 12/08/2019 16:58   Result Date: 12/08/2019 CLINICAL DATA:  Acute generalized abdominal pain. EXAM: CT ABDOMEN AND PELVIS WITHOUT CONTRAST TECHNIQUE: Multidetector CT imaging of the abdomen and pelvis was performed following the standard protocol without IV contrast. COMPARISON:  June 21, 2019. FINDINGS: Lower chest: Minimal right posterior basilar subsegmental atelectasis is noted. Hepatobiliary: No focal liver abnormality is seen. No gallstones, gallbladder wall thickening, or  biliary dilatation. Pancreas: Unremarkable. No pancreatic ductal dilatation or surrounding inflammatory changes. Spleen: Normal in size without focal abnormality. Adrenals/Urinary Tract: Stable bilateral adrenal masses are noted most consistent with adenomas. No hydronephrosis or renal obstruction is noted. No renal or ureteral calculi are noted. Urinary bladder is unremarkable. Stomach/Bowel: Status post right hemicolectomy. The stomach is unremarkable. There is no evidence of bowel obstruction or inflammation. Vascular/Lymphatic: Aortic atherosclerosis. No enlarged abdominal or pelvic lymph nodes. Reproductive: Uterus and bilateral adnexa are unremarkable. Other: Mild pneumoperitoneum is noted consistent with recent surgery. No ascites is noted. Musculoskeletal: No acute or significant osseous findings. Electronically Signed: By: Marijo Conception M.D. On: 12/08/2019 13:14   NM Pulmonary Perf and Vent  Result Date: 12/09/2019 CLINICAL DATA:  Positive D-dimer study. History of breast and colon carcinoma. COPD EXAM: NUCLEAR MEDICINE VENTILATION - PERFUSION LUNG SCAN VIEWS: Anterior,  posterior, left lateral, right lateral, RPO, RAO, LPO, LAO-ventilation and perfusion RADIOPHARMACEUTICALS:  30.52 mCi of Tc-54m DTPA aerosol inhalation and 3.86 mCi Tc50m MAA IV COMPARISON:  Chest radiograph December 08, 2019 FINDINGS: Ventilation: Radiotracer uptake on the ventilation study is homogeneous and symmetric bilaterally. No appreciable ventilation defects. Perfusion: Radiotracer uptake on the perfusion study is homogeneous and symmetric bilaterally. No appreciable perfusion defects. IMPRESSION: No appreciable ventilation or perfusion defects. Very low probability of pulmonary embolus. Electronically Signed   By: Lowella Grip III M.D.   On: 12/09/2019 14:14   DG Chest Portable 1 View  Result Date: 12/24/2019 CLINICAL DATA:  Shortness of breath and urticaria EXAM: PORTABLE CHEST 1 VIEW COMPARISON:  December 08, 2019 FINDINGS: Port-A-Cath tip is in the superior vena cava. No pneumothorax. The lungs are clear. Heart is upper normal in size with pulmonary vascularity normal. No adenopathy. There is aortic atherosclerosis. No bone lesions. IMPRESSION: Stable Port-A-Cath position. No pneumothorax. No edema or airspace opacity. Stable cardiac silhouette. No adenopathy. Aortic Atherosclerosis (ICD10-I70.0). Electronically Signed   By: Lowella Grip III M.D.   On: 12/24/2019 11:45   DG Chest Port 1 View  Result Date: 12/08/2019 CLINICAL DATA:  Left lower quadrant abdominal pain. Surgical procedure yesterday. Recent port placement. EXAM: PORTABLE CHEST 1 VIEW COMPARISON:  CT same day. Chest radiography yesterday. FINDINGS: Power port inserted from a right internal jugular approach. Tip at the SVC RA junction. No pneumothorax. Mild bibasilar atelectasis. Small amount of pneumoperitoneum as shown on the previous CT consistent with yesterday's procedure. IMPRESSION: Mild bibasilar atelectasis. Power port tip at the SVC RA junction. No pneumothorax. Small amount of pneumoperitoneum as shown on the previous  CT. Electronically Signed   By: Nelson Chimes M.D.   On: 12/08/2019 16:48   DG CHEST PORT 1 VIEW  Result Date: 12/07/2019 CLINICAL DATA:  Status post Port-A-Cath placement. EXAM: PORTABLE CHEST 1 VIEW COMPARISON:  October 05, 2019. FINDINGS: Stable cardiomediastinal silhouette. No pneumothorax pleural effusion is noted. Mild bibasilar subsegmental atelectasis or scarring is noted. Interval placement of right internal jugular Port-A-Cath with distal tip in expected position of SVC. Atherosclerosis of thoracic aorta is noted. Bony thorax is unremarkable. IMPRESSION: Interval placement of right internal jugular Port-A-Cath with distal tip in expected position of the SVC. No pneumothorax is noted. Electronically Signed   By: Marijo Conception M.D.   On: 12/07/2019 10:28   DG C-Arm 1-60 Min-No Report  Result Date: 12/07/2019 Fluoroscopy was utilized by the requesting physician.  No radiographic interpretation.   ECHOCARDIOGRAM COMPLETE  Result Date: 12/09/2019    ECHOCARDIOGRAM REPORT   Patient Name:  NAQUESHA KASCHAK [facility] Date of Exam: 12/09/2019 Medical Rec #:  [id number]     Height:       64.0 in Accession #:    [id number]    Weight:       158.8 lb Date of Birth:  1946/05/25     BSA:          1.774 m Patient Age:    65 years      BP:           142/78 mmHg Patient Gender: F             HR:           62 bpm. Exam Location:  [facility] Procedure: 2D Echo, Cardiac Doppler and Color Doppler Indications:    Atrial Fibrillation 427.31  History:        Patient has no prior history of Echocardiogram examinations.                 COPD.  Sonographer:    Sherrie Sport RDCS (AE) Referring Phys: [id number] [facility] T T[facility] Diagnosing      Serafina Royals MD Phys: IMPRESSIONS  1. Left ventricular ejection fraction, by estimation, is 50 to 55%. The left ventricle has low normal function. The left ventricle has no regional wall motion abnormalities. Left ventricular diastolic parameters were normal.  2. Right ventricular systolic function is normal. The  right ventricular size is normal.  3. The mitral valve is normal in structure. Trivial mitral valve regurgitation.  4. The aortic valve is normal in structure. Aortic valve regurgitation is not visualized. FINDINGS  Left Ventricle: Left ventricular ejection fraction, by estimation, is 50 to 55%. The left ventricle has low normal function. The left ventricle has no regional wall motion abnormalities. The left ventricular internal cavity size was normal in size. There is no left ventricular hypertrophy. Left ventricular diastolic parameters were normal. Right Ventricle: The right ventricular size is normal. No increase in right ventricular wall thickness. Right ventricular systolic function is normal. Left Atrium: Left atrial size was normal in size. Right Atrium: Right atrial size was normal in size. Pericardium: There is no evidence of pericardial effusion. Mitral Valve: The mitral valve is normal in structure. Trivial mitral valve regurgitation. Tricuspid Valve: The tricuspid valve is normal in structure. Tricuspid valve regurgitation is trivial. Aortic Valve: The aortic valve is normal in structure. Aortic valve regurgitation is not visualized. Aortic valve mean gradient measures 3.5 mmHg. Aortic valve peak gradient measures 6.0 mmHg. Aortic valve area, by VTI measures 2.29 cm. Pulmonic Valve: The pulmonic valve was normal in structure. Pulmonic valve regurgitation is not visualized. Aorta: The aortic root and ascending aorta are structurally normal, with no evidence of dilitation. IAS/Shunts: No atrial level shunt detected by color flow Doppler.  LEFT VENTRICLE PLAX 2D LVIDd:         4.08 cm  Diastology LVIDs:         2.85 cm  LV e' lateral:   9.36 cm/s LV PW:         1.18 cm  LV E/e' lateral: 11.5 LV IVS:        0.92 cm  LV e' medial:    8.81 cm/s LVOT diam:     2.00 cm  LV E/e' medial:  12.3 LV SV:         56 LV SV Index:   32 LVOT Area:     3.14 cm  RIGHT VENTRICLE RV Basal diam:  4.02 cm LEFT ATRIUM  Index       RIGHT ATRIUM           Index LA diam:        4.40 cm 2.48 cm/m  RA Area:     21.80 cm LA Vol (A2C):   81.2 ml 45.78 ml/m RA Volume:   68.60 ml  38.68 ml/m LA Vol (A4C):   71.0 ml 40.03 ml/m LA Biplane Vol: 75.0 ml 42.29 ml/m  AORTIC VALVE                   PULMONIC VALVE AV Area (Vmax):    2.21 cm    PV Vmax:        0.78 m/s AV Area (Vmean):   2.10 cm    PV Peak grad:   2.4 mmHg AV Area (VTI):     2.29 cm    RVOT Peak grad: 3 mmHg AV Vmax:           122.00 cm/s AV Vmean:          83.250 cm/s AV VTI:            0.244 m AV Peak Grad:      6.0 mmHg AV Mean Grad:      3.5 mmHg LVOT Vmax:         85.70 cm/s LVOT Vmean:        55.700 cm/s LVOT VTI:          0.178 m LVOT/AV VTI ratio: 0.73  AORTA Ao Root diam: 2.60 cm MITRAL VALVE MV Area (PHT): 3.37 cm     SHUNTS MV Decel Time: 225 msec     Systemic VTI:  0.18 m MV E velocity: 108.00 cm/s  Systemic Diam: 2.00 cm MV A velocity: 62.10 cm/s MV E/A ratio:  1.74 Serafina Royals MD Electronically signed by Serafina Royals MD Signature Date/Time: 12/09/2019/1:47:20 PM    Final     Microbiology: Recent Results (from the past 240 hour(s))  SARS CORONAVIRUS 2 (TAT 6-24 HRS) Nasopharyngeal Nasopharyngeal Swab     Status: None   Collection Time: 12/24/19 11:24 AM   Specimen: Nasopharyngeal Swab  Result Value Ref Range Status   SARS Coronavirus 2 NEGATIVE NEGATIVE Final    Comment: (NOTE) SARS-CoV-2 target nucleic acids are NOT DETECTED. The SARS-CoV-2 RNA is generally detectable in upper and lower respiratory specimens during the acute phase of infection. Negative results do not preclude SARS-CoV-2 infection, do not rule out co-infections with other pathogens, and should not be used as the sole basis for treatment or other patient management decisions. Negative results must be combined with clinical observations, patient history, and epidemiological information. The expected result is Negative. Fact Sheet for  Patients: SugarRoll.be Fact Sheet for Healthcare Providers: https://www.woods-mathews.com/ This test is not yet approved or cleared by the [place] FDA and  has been authorized for detection and/or diagnosis of SARS-CoV-2 by FDA under an Emergency Use Authorization (EUA). This EUA will remain  in effect (meaning this test can be used) for the duration of the COVID-19 declaration under Section 56 4(b)(1) of the Act, 21 U.S.C. section 360bbb-3(b)(1), unless the authorization is terminated or revoked sooner. Performed at [facility], [place] [address], [place],  [address]      Labs: CBC: Recent Labs  Lab 12/24/19 0823 12/24/19 1124 12/25/19 0501  WBC 11.1* 8.2 13.7*  NEUTROABS 7.3 2.4  --   HGB 12.4 13.8 11.9*  HCT 39.6 44.0 38.6  MCV 86.8 86.1 87.7  PLT 379 418* [id number]   Basic Metabolic Panel: Recent Labs  Lab 12/24/19 0823 12/24/19 1124 12/25/19 0501  NA 134* 139 138  K 4.4 4.5 4.8  CL 100 104 102  CO2 26 23 27   GLUCOSE 94 125* 143*  BUN 19 17 16   CREATININE 0.66 0.69 0.74  CALCIUM 8.3* 8.6* 8.9  MG 2.0  --   --    Liver Function Tests: Recent Labs  Lab 12/24/19 0823  AST 12*  ALT 10  ALKPHOS 72  BILITOT 0.4  PROT 7.1  ALBUMIN 3.4*   No results for input(s): LIPASE, AMYLASE in the last 168 hours. No results for input(s): AMMONIA in the last 168 hours. Cardiac Enzymes: No results for input(s): CKTOTAL, CKMB, CKMBINDEX, TROPONINI in the last 168 hours. BNP (last 3 results) Recent Labs    12/08/19 1618  BNP 692.0*   CBG: Recent Labs  Lab 12/25/19 0757  GLUCAP 122*    Time spent: 35 minutes  Signed:  Berle Mull  [facility] 12/25/2019 6:15 PM

## 2019-12-29 ENCOUNTER — Other Ambulatory Visit: Payer: Self-pay

## 2019-12-29 ENCOUNTER — Encounter: Payer: Self-pay | Admitting: Surgery

## 2019-12-29 ENCOUNTER — Other Ambulatory Visit: Payer: Self-pay | Admitting: Oncology

## 2019-12-29 ENCOUNTER — Ambulatory Visit (INDEPENDENT_AMBULATORY_CARE_PROVIDER_SITE_OTHER): Payer: Self-pay | Admitting: Surgery

## 2019-12-29 VITALS — BP 161/86 | HR 67 | Temp 94.6°F | Ht 64.0 in | Wt 158.4 lb

## 2019-12-29 DIAGNOSIS — Z09 Encounter for follow-up examination after completed treatment for conditions other than malignant neoplasm: Secondary | ICD-10-CM

## 2019-12-29 DIAGNOSIS — C184 Malignant neoplasm of transverse colon: Secondary | ICD-10-CM

## 2019-12-29 MED ORDER — ENCORAFENIB 75 MG PO CAPS: 300.0000 mg | ORAL_CAPSULE | Freq: Every day | ORAL | 11 refills | Status: DC

## 2019-12-29 NOTE — Patient Instructions (Addendum)
Follow-up with our office as needed.  Please call and ask to speak with a nurse if you develop questions or concerns.   GENERAL POST-OPERATIVE PATIENT INSTRUCTIONS   FOLLOW-UP:  Please make an appointment with your physician in.  Call your physician immediately if you have any fevers greater than 102.5, drainage from you wound that is not clear or looks infected, persistent bleeding, increasing abdominal pain, problems urinating, or persistent nausea/vomiting.    WOUND CARE INSTRUCTIONS:  Keep a dry clean dressing on the wound if there is drainage. The initial bandage may be removed after 24 hours.  Once the wound has quit draining you may leave it open to air.  If clothing rubs against the wound or causes irritation and the wound is not draining you may cover it with a dry dressing during the daytime.  Try to keep the wound dry and avoid ointments on the wound unless directed to do so.  If the wound becomes bright red and painful or starts to drain infected material that is not clear, please contact your physician immediately.  If the wound is mildly pink and has a thick firm ridge underneath it, this is normal, and is referred to as a healing ridge.  This will resolve over the next 4-6 weeks.  DIET:  You may eat any foods that you can tolerate.  It is a good idea to eat a high fiber diet and take in plenty of fluids to prevent constipation.  If you do become constipated you may want to take a mild laxative or take ducolax tablets on a daily basis until your bowel habits are regular.  Constipation can be very uncomfortable, along with straining, after recent surgery.  ACTIVITY:  You are encouraged to cough and deep breath or use your incentive spirometer if you were given one, every 15-30 minutes when awake.  This will help prevent respiratory complications and low grade fevers post-operatively if you had a general anesthetic.  You may want to hug a pillow when coughing and sneezing to add additional  support to the surgical area, if you had abdominal or chest surgery, which will decrease pain during these times.  You are encouraged to walk and engage in light activity for the next two weeks.  You should not lift more than 20 pounds during this time frame as it could put you at increased risk for complications.  Twenty pounds is roughly equivalent to a plastic bag of groceries.    MEDICATIONS:  Try to take narcotic medications and anti-inflammatory medications, such as tylenol, ibuprofen, naprosyn, etc., with food.  This will minimize stomach upset from the medication.  Should you develop nausea and vomiting from the pain medication, or develop a rash, please discontinue the medication and contact your physician.  You should not drive, make important decisions, or operate machinery when taking narcotic pain medication.  QUESTIONS:  Please feel free to call your physician or the hospital operator if you have any questions, and they will be glad to assist you.   

## 2019-12-29 NOTE — Progress Notes (Signed)
DISCONTINUE ON PATHWAY REGIMEN - Colorectal     Cycle 1: A cycle is 28 days:     Encorafenib      Cetuximab      Cetuximab    Cycles 2 and beyond: A cycle is every 28 days:     Encorafenib      Cetuximab   **Always confirm dose/schedule in your pharmacy ordering system**  REASON: Toxicities / Adverse Event PRIOR TREATMENT: [id number]: Encorafenib 300 mg Daily D1-28 + Cetuximab 400/250 mg/m2 D1,8,15,22 q28 Days TREATMENT RESPONSE: Unable to Evaluate  Colorectal - No Medical Intervention - Off Treatment.  Patient Characteristics: Distant Metastases, Nonsurgical Candidate, BRAF V600 Mutation Positive (KRAS/NRAS Wild-Type), Standard Cytotoxic/Targeted Therapy, Second Line Standard Cytotoxic/Targeted Therapy Tumor Location: Colon Therapeutic Status: Distant Metastases Microsatellite/Mismatch Repair Status: MSS/pMMR BRAF Mutation Status: Mutation Positive KRAS/NRAS Mutation Status: Wild-Type (no mutation) Standard Cytotoxic/Targeted Line of Therapy: Second Line Standard Cytotoxic/Targeted Therapy

## 2019-12-30 NOTE — Progress Notes (Signed)
Kimberly [name] is a 74 year old female well-known to me status post diagnostic laparoscopy revealing evidence of brought metastatic peritoneal metastases.  He is doing well.  She did have a complicated postoperative course significant for A. fib with RVR.  No surgery complications. She is eating and she is having some intermittent chronic pain.  PE  Abdomen: Soft nontender, incisions healing well without infection.  Evidence of abdominal wall metastasis.  A/P no surgical complications  Follow-up as outpatient with oncology.

## 2019-12-31 ENCOUNTER — Other Ambulatory Visit: Payer: Self-pay

## 2019-12-31 ENCOUNTER — Encounter: Payer: Self-pay | Admitting: Oncology

## 2019-12-31 ENCOUNTER — Inpatient Hospital Stay: Payer: Medicare HMO

## 2019-12-31 ENCOUNTER — Inpatient Hospital Stay: Payer: Medicare HMO | Attending: Oncology | Admitting: Oncology

## 2019-12-31 VITALS — BP 162/92 | HR 64 | Temp 98.3°F | Resp 18 | Wt 157.8 lb

## 2019-12-31 DIAGNOSIS — I4891 Unspecified atrial fibrillation: Secondary | ICD-10-CM | POA: Insufficient documentation

## 2019-12-31 DIAGNOSIS — Z79899 Other long term (current) drug therapy: Secondary | ICD-10-CM | POA: Insufficient documentation

## 2019-12-31 DIAGNOSIS — Z833 Family history of diabetes mellitus: Secondary | ICD-10-CM | POA: Insufficient documentation

## 2019-12-31 DIAGNOSIS — R1032 Left lower quadrant pain: Secondary | ICD-10-CM | POA: Diagnosis not present

## 2019-12-31 DIAGNOSIS — R220 Localized swelling, mass and lump, head: Secondary | ICD-10-CM | POA: Diagnosis not present

## 2019-12-31 DIAGNOSIS — E279 Disorder of adrenal gland, unspecified: Secondary | ICD-10-CM | POA: Insufficient documentation

## 2019-12-31 DIAGNOSIS — Z933 Colostomy status: Secondary | ICD-10-CM | POA: Insufficient documentation

## 2019-12-31 DIAGNOSIS — R519 Headache, unspecified: Secondary | ICD-10-CM | POA: Diagnosis not present

## 2019-12-31 DIAGNOSIS — R110 Nausea: Secondary | ICD-10-CM | POA: Diagnosis not present

## 2019-12-31 DIAGNOSIS — Z7901 Long term (current) use of anticoagulants: Secondary | ICD-10-CM | POA: Insufficient documentation

## 2019-12-31 DIAGNOSIS — C184 Malignant neoplasm of transverse colon: Secondary | ICD-10-CM | POA: Diagnosis not present

## 2019-12-31 DIAGNOSIS — Z888 Allergy status to other drugs, medicaments and biological substances status: Secondary | ICD-10-CM | POA: Insufficient documentation

## 2019-12-31 DIAGNOSIS — R1031 Right lower quadrant pain: Secondary | ICD-10-CM | POA: Insufficient documentation

## 2019-12-31 DIAGNOSIS — Z853 Personal history of malignant neoplasm of breast: Secondary | ICD-10-CM | POA: Insufficient documentation

## 2019-12-31 DIAGNOSIS — R5383 Other fatigue: Secondary | ICD-10-CM | POA: Insufficient documentation

## 2019-12-31 DIAGNOSIS — D509 Iron deficiency anemia, unspecified: Secondary | ICD-10-CM | POA: Diagnosis not present

## 2019-12-31 DIAGNOSIS — D500 Iron deficiency anemia secondary to blood loss (chronic): Secondary | ICD-10-CM

## 2019-12-31 DIAGNOSIS — Z836 Family history of other diseases of the respiratory system: Secondary | ICD-10-CM | POA: Insufficient documentation

## 2019-12-31 DIAGNOSIS — Z596 Low income: Secondary | ICD-10-CM | POA: Insufficient documentation

## 2019-12-31 DIAGNOSIS — K56609 Unspecified intestinal obstruction, unspecified as to partial versus complete obstruction: Secondary | ICD-10-CM | POA: Insufficient documentation

## 2019-12-31 DIAGNOSIS — Z9049 Acquired absence of other specified parts of digestive tract: Secondary | ICD-10-CM | POA: Insufficient documentation

## 2019-12-31 DIAGNOSIS — R590 Localized enlarged lymph nodes: Secondary | ICD-10-CM | POA: Insufficient documentation

## 2019-12-31 DIAGNOSIS — Z86010 Personal history of colonic polyps: Secondary | ICD-10-CM | POA: Insufficient documentation

## 2019-12-31 DIAGNOSIS — Z5112 Encounter for antineoplastic immunotherapy: Secondary | ICD-10-CM | POA: Diagnosis not present

## 2019-12-31 DIAGNOSIS — I700 Atherosclerosis of aorta: Secondary | ICD-10-CM | POA: Insufficient documentation

## 2019-12-31 DIAGNOSIS — J449 Chronic obstructive pulmonary disease, unspecified: Secondary | ICD-10-CM | POA: Diagnosis not present

## 2019-12-31 DIAGNOSIS — K118 Other diseases of salivary glands: Secondary | ICD-10-CM

## 2019-12-31 DIAGNOSIS — M7989 Other specified soft tissue disorders: Secondary | ICD-10-CM | POA: Insufficient documentation

## 2019-12-31 DIAGNOSIS — R634 Abnormal weight loss: Secondary | ICD-10-CM | POA: Insufficient documentation

## 2019-12-31 DIAGNOSIS — K5900 Constipation, unspecified: Secondary | ICD-10-CM | POA: Diagnosis not present

## 2019-12-31 DIAGNOSIS — T782XXS Anaphylactic shock, unspecified, sequela: Secondary | ICD-10-CM

## 2019-12-31 DIAGNOSIS — R7989 Other specified abnormal findings of blood chemistry: Secondary | ICD-10-CM | POA: Insufficient documentation

## 2019-12-31 DIAGNOSIS — G893 Neoplasm related pain (acute) (chronic): Secondary | ICD-10-CM | POA: Diagnosis not present

## 2019-12-31 DIAGNOSIS — K219 Gastro-esophageal reflux disease without esophagitis: Secondary | ICD-10-CM | POA: Insufficient documentation

## 2019-12-31 DIAGNOSIS — T782XXA Anaphylactic shock, unspecified, initial encounter: Secondary | ICD-10-CM | POA: Insufficient documentation

## 2019-12-31 DIAGNOSIS — Z923 Personal history of irradiation: Secondary | ICD-10-CM | POA: Insufficient documentation

## 2019-12-31 DIAGNOSIS — R531 Weakness: Secondary | ICD-10-CM | POA: Insufficient documentation

## 2019-12-31 DIAGNOSIS — Z8249 Family history of ischemic heart disease and other diseases of the circulatory system: Secondary | ICD-10-CM | POA: Insufficient documentation

## 2019-12-31 DIAGNOSIS — Z87891 Personal history of nicotine dependence: Secondary | ICD-10-CM | POA: Insufficient documentation

## 2019-12-31 DIAGNOSIS — Z800 Family history of malignant neoplasm of digestive organs: Secondary | ICD-10-CM | POA: Insufficient documentation

## 2019-12-31 MED ORDER — DEXAMETHASONE 4 MG PO TABS: 8.0000 mg | ORAL_TABLET | Freq: Every day | ORAL | 0 refills | Status: DC

## 2019-12-31 NOTE — Progress Notes (Signed)
[facility] Follow up  Visit:  Patient Care Team: Birdie Sons, MD as PCP - General (Family Medicine) Earlie Server, MD as Consulting Physician (Oncology) Noreene Filbert, MD as Referring Physician (Radiation Oncology) Jules Husbands, MD as Consulting Physician (General Surgery) Benedetto Goad, RN as Registered Nurse Cathi Roan, [facility] (Pharmacist) Clent Jacks, RN as Oncology Nurse Navigator  PURPOSE OF VISIT: Stage IV colon cancer  HISTORY OF PRESENTING ILLNESS: Kimberly [name] 74 y.o. female with PMH listed as below presents for follow up for the management of Stage IIIB Colon Cancer Patient reports a remote history of breast cancer s/p lumpectomy and radiation treatments.   Patient had been having abdominal pain, weight loss and bloating.  CT scan showed partial obstruction at the level of transverse colon and ileocolic mesenteric adenopathy. She was prepared for a colonoscopy on 04/15/2017. She started to have worsened abdominal pain, nausea vomiting, unable to maintain oral intake and was sent to ED. She was found to have bowel obstruction and underwent  exploratory laparotomy with transverse colectomy, with end colostomy and mucous fistula formation for obstructing colon lesion. Differential diagnosis prior to surgery was metastatic breast cancer in colon vs colon cancer. The patient did have a colonoscopy 2 years ago without the mass being present at that time but did have 2 small polyps in the transverse colon that were removed.  04/15/2017 Patient underwent transverse colon resection.  Pathology showed T4aN1 moderate differentiated adenocarcinoma, Grade 2, tumor invades visceral peritoneum, LVI present, negative margin, 3/16 lymph nodes involved with cancer. Low probability of MSI-H.   # Genetic test INVITAE came back negative. No pathogenetic sequence variants or deletions/duplications identified. Results was scanned to [facility] (a copy of the report was  provided to patient and her daughter)  # # baseline CEA is 2.1 # colostomy reversal on 06/25/2018, subsequently developed C. difficile colitis x 2.  Multiple abdominal CT abdomen and pelvis for obtained, not listed here #09/01/2018 CT abdomen pelvis showed abdominal wall abscess,  percutaneous drain placement .treatment with antibiotics with Augmentin 10 days course.  #09/14/2018 CT abdomen/pelvis showed resolution of abscess, no intraabdominal abscess.  Mediport was discontinued. # 08/13/2019 colonoscopy showed localized area of mildly thickened folds of the mucosa was found in the sigmoid colon. Biopsy pathology showed benign mucosa with lymphoid aggregates and focal minimal inflammation. No dysplasia.   TREATMENT:  04/15/2017 transverse colon resection 05/27/2017- 11/17/2017 adjuvant FOLFOX, Oxaliplatin was discontinued after 3 cycles due to worsening of pre-existing neuropathy. Finished another 9 cycles of 5-FU.  March -April 2019 adjuvant RT.  06/25/2018 Colostomy Reversal.   09/21/2019 PET scan showed 2 small right abdominal mesenteric soft tissue nodule which showed no significant change in size compared to recent CT but hypermetabolic.  Peritoneal metastatic disease cannot be excluded. 1.1 cm hypermetabolic nodule in the right parotid gland suspicious for primary parotid neoplasm.  08/27/2020, CT-guided biopsy of abdominal wall mass was obtained. Results showed metastatic adenocarcinoma, compatible with colon primary.  # 12/07/2019 She underwent surgery for laparoscopic evaluation of peritoneum and placement of Mediport. # 12/08/2019- 12/10/2019  She was admitted due to atrial fibrillation.  Patient was started on anticoagulation with Eliquis 5 mg twice daily.  Patient was reverted back to normal sinus rhythm.  She is on metoprolol for rate control.   INTERVAL HISTORY 74 y.o. female  presents for follow-up of recurrent colon cancer. She had anaphylactic reaction to Cetuximab 1 weeks ago. She  got epinephrine. Did not get  intubated.  Her symptoms improved and she was discharged on a tapering steroid course.  Patient reports feeling better.  Denies any breathing difficulties today. Daughter reports that when she eats pecan, she sometimes experiences throat discomfort. Patient also reports that right lower quadrant pain which was triggered after lifting some dirt from the garden.   Review of Systems  Constitutional: Positive for fatigue. Negative for appetite change, chills, fever and unexpected weight change.  HENT:   Negative for hearing loss, lump/mass, nosebleeds and voice change.   Eyes: Negative for eye problems.  Respiratory: Negative for chest tightness, cough and shortness of breath.   Cardiovascular: Positive for leg swelling. Negative for chest pain.  Gastrointestinal: Positive for constipation and nausea. Negative for abdominal distention, abdominal pain, blood in stool and diarrhea.  Endocrine: Negative for hot flashes.  Genitourinary: Negative for difficulty urinating, dysuria and frequency.   Musculoskeletal: Negative for arthralgias, gait problem and myalgias.  Skin: Negative for itching, rash and wound.  Neurological: Negative for dizziness, extremity weakness, gait problem and headaches.       Chronic numbness and tingling of fingertips and toes.  Hematological: Negative for adenopathy. Does not bruise/bleed easily.  Psychiatric/Behavioral: Negative for confusion, decreased concentration and depression. The patient is not nervous/anxious.       MEDICAL HISTORY: Past Medical History:  Diagnosis Date  . Breast cancer, left ([place]) 2004   Lumpectomy and rad tx's.  . Chronic back pain   . Colon cancer ([place]) 2018  . COPD (chronic obstructive pulmonary disease) ([place])   . Degenerative disc disease, lumbar 04/2013  . Family history of adverse reaction to anesthesia    son arrested after anesthesia  . GERD (gastroesophageal reflux disease)   . Iron deficiency anemia  05/27/2017  . Personal history of chemotherapy 2018   Colon  . Personal history of radiation therapy    Breast 2004  . Personal history of radiation therapy 2018   Colon  . Postprocedural intraabdominal abscess 09/01/2018  . Small bowel obstruction ([place]) 04/16/2017  . Wears dentures    full upper    SURGICAL HISTORY: Past Surgical History:  Procedure Laterality Date  . APPENDECTOMY  1965  . BREAST EXCISIONAL BIOPSY Left 08/01/2003   lumpectomy rad 11/04-2/28/2005  . BREAST SURGERY    . CESAREAN SECTION     x3  . COLON SURGERY    . COLONOSCOPY W/ POLYPECTOMY    . COLONOSCOPY WITH PROPOFOL N/A 04/09/2018   Procedure: COLONOSCOPY WITH PROPOFOL;  Surgeon: Virgel Manifold, MD;  Location: [facility];  Service: Gastroenterology;  Laterality: N/A;  . COLONOSCOPY WITH PROPOFOL N/A 08/13/2019   Procedure: COLONOSCOPY WITH PROPOFOL;  Surgeon: Virgel Manifold, MD;  Location: [place];  Service: Endoscopy;  Laterality: N/A;  . COLOSTOMY TAKEDOWN N/A 06/25/2018   Procedure: COLOSTOMY TAKEDOWN;  Surgeon: Jules Husbands, MD;  Location: [facility];  Service: General;  Laterality: N/A;  . ESOPHAGOGASTRODUODENOSCOPY (EGD) WITH PROPOFOL N/A 04/04/2017   Procedure: ESOPHAGOGASTRODUODENOSCOPY (EGD) WITH PROPOFOL;  Surgeon: [name], MD;  Location: [place];  Service: Endoscopy;  Laterality: N/A;  . FRACTURE SURGERY    . HIP FRACTURE SURGERY Left 01/25/2012  . LAPAROSCOPY N/A 12/07/2019   Procedure: LAPAROSCOPY DIAGNOSTIC;  Surgeon: Jules Husbands, MD;  Location: [facility];  Service: General;  Laterality: N/A;  . LAPAROTOMY N/A 04/15/2017   Procedure: EXPLORATORY LAPAROTOMY;  Surgeon: Clayburn Pert, MD;  Location: [facility];  Service: General;  Laterality: N/A;  . NECK SURGERY  12/2011  .  PARASTOMAL HERNIA REPAIR N/A 12/03/2017   Procedure: HERNIA REPAIR PARASTOMAL;  Surgeon: Jules Husbands, MD;  Location: [facility];  Service: General;  Laterality: N/A;  . PARASTOMAL HERNIA  REPAIR N/A 06/25/2018   Procedure: HERNIA REPAIR PARASTOMAL;  Surgeon: Jules Husbands, MD;  Location: [facility];  Service: General;  Laterality: N/A;  . PORTACATH PLACEMENT Right 05/21/2017   Procedure: INSERTION PORT-A-CATH;  Surgeon: Clayburn Pert, MD;  Location: [facility];  Service: General;  Laterality: Right;  . PORTACATH PLACEMENT Right 12/07/2019   Procedure: INSERTION PORT-A-CATH;  Surgeon: Jules Husbands, MD;  Location: [facility];  Service: General;  Laterality: Right;  . WRIST FRACTURE SURGERY      SOCIAL HISTORY: Social History   Socioeconomic History  . Marital status: Divorced    Spouse name: Not on file  . Number of children: 3  . Years of education: Not on file  . Highest education level: 7th grade  Occupational History  . Occupation: retired  Tobacco Use  . Smoking status: Former Smoker    Packs/day: 0.25    Years: 55.00    Pack years: 13.75    Types: Cigarettes    Quit date: 05/21/2017    Years since quitting: 2.6  . Smokeless tobacco: Never Used  . Tobacco comment: started age 2 1/2 to 2 ppd  Substance and Sexual Activity  . Alcohol use: Yes    Alcohol/week: 0.0 standard drinks    Comment: occasionally drinks beer  . Drug use: No  . Sexual activity: Never  Other Topics Concern  . Not on file  Social History Narrative   Lives at home alone   Social Determinants of Health   Financial Resource Strain: Medium Risk  . Difficulty of Paying Living Expenses: Somewhat hard  Food Insecurity:   . Worried About Charity fundraiser in the Last Year:   . Arboriculturist in the Last Year:   Transportation Needs:   . Film/video editor (Medical):   Marland Kitchen Lack of Transportation (Non-Medical):   Physical Activity: Inactive  . Days of Exercise per Week: 0 days  . Minutes of Exercise per Session: 0 min  Stress:   . Feeling of Stress :   Social Connections: Unknown  . Frequency of Communication with Friends and Family: Patient refused  . Frequency of Social  Gatherings with Friends and Family: Patient refused  . Attends Religious Services: Patient refused  . Active Member of Clubs or Organizations: Patient refused  . Attends Archivist Meetings: Patient refused  . Marital Status: Patient refused  Intimate Partner Violence: Unknown  . Fear of Current or Ex-Partner: Patient refused  . Emotionally Abused: Patient refused  . Physically Abused: Patient refused  . Sexually Abused: Patient refused    FAMILY HISTORY Family History  Problem Relation Age of Onset  . Diabetes Mother        type 2  . Coronary artery disease Mother   . Deep vein thrombosis Mother   . Colon cancer Father   . Asthma Brother   . Diabetes Brother        type 2  . Breast cancer Neg Hx     ALLERGIES:  is allergic to erbitux [cetuximab]; betadine [povidone iodine]; iodine; other; and sinus formula [cholestatin].  MEDICATIONS:  Current Outpatient Medications  Medication Sig Dispense Refill  . apixaban (ELIQUIS) 5 MG TABS tablet Take 1 tablet (5 mg total) by mouth 2 (two) times daily. 60 tablet 0  .  budesonide-formoterol (SYMBICORT) 80-4.5 MCG/ACT inhaler Inhale 2 puffs into the lungs 2 (two) times daily. 3 Inhaler 3  . calcium citrate-vitamin D (CITRACAL+D) 315-200 MG-UNIT tablet Take 1 tablet by mouth daily.    . Cholecalciferol (VITAMIN D3) 50 MCG (2000 UT) TABS Take 2,000 Units by mouth daily.    . diphenhydrAMINE (BENADRYL) 25 MG tablet Take 2 tablets (50 mg total) by mouth at bedtime as needed for itching or allergies. 30 tablet 0  . encorafenib (BRAFTOVI) 75 MG capsule Take 4 capsules (300 mg total) by mouth daily. 120 capsule 11  . EPINEPHrine 0.3 mg/0.3 mL IJ SOAJ injection Inject 0.3 mLs (0.3 mg total) into the muscle as needed for anaphylaxis. 1 each 0  . gabapentin (NEURONTIN) 400 MG capsule TAKE 1 CAPSULE THREE TIMES DAILY (Patient taking differently: Take 600 mg by mouth 3 (three) times daily. ) 270 capsule 1  . HYDROcodone-acetaminophen  (NORCO/VICODIN) 5-325 MG tablet Take 1-2 tablets by mouth every 6 (six) hours as needed for moderate pain. 20 tablet 0  . meloxicam (MOBIC) 15 MG tablet TAKE 1 TABLET EVERY DAY AS NEEDED (Patient taking differently: Take 15 mg by mouth at bedtime. ) 90 tablet 3  . metoprolol tartrate (LOPRESSOR) 25 MG tablet Take 1 tablet (25 mg total) by mouth 2 (two) times daily. 60 tablet 0  . Multiple Vitamins-Minerals (WOMENS MULTI VITAMIN & MINERAL PO) Take 2 tablets by mouth daily.     Marland Kitchen omeprazole (PRILOSEC) 20 MG capsule Take 1 capsule (20 mg total) by mouth daily. 90 capsule 4  . pravastatin (PRAVACHOL) 40 MG tablet TAKE 1 TABLET EVERY DAY (Patient taking differently: Take 40 mg by mouth daily. ) 90 tablet 4  . predniSONE (DELTASONE) 10 MG tablet Take '40mg'$  daily for 3days,Take '30mg'$  daily for 3days,Take '20mg'$  daily for 3days,Take '10mg'$  daily for 3days, then stop 30 tablet 0  . Probiotic Product (PROBIOTIC DAILY PO) Take 1 capsule by mouth daily.     Marland Kitchen senna-docusate (SENOKOT-S) 8.6-50 MG tablet Take 2 tablets by mouth daily. 60 tablet 1  . dexamethasone (DECADRON) 4 MG tablet Take 2 tablets (8 mg total) by mouth daily. Take 2 tablets (8 mg) the day before treatment. Then, 2 tabs ('8mg'$ ) daily for 2 days after treatment. 6 tablet 0   No current facility-administered medications for this visit.    PHYSICAL EXAMINATION:  ECOG PERFORMANCE STATUS: 1 - Symptomatic but completely ambulatory  Vitals:   12/31/19 0845  BP: (!) 162/92  Pulse: 64  Resp: 18  Temp: 98.3 F (36.8 C)   Filed Weights   12/31/19 0845  Weight: 157 lb 12.8 oz (71.6 kg)    Physical Exam  Constitutional: She is oriented to person, place, and time. No distress.  HENT:  Head: Normocephalic and atraumatic.  Nose: Nose normal.  Mouth/Throat: Oropharynx is clear and moist. No oropharyngeal exudate.  Eyes: Pupils are equal, round, and reactive to light. EOM are normal. No scleral icterus.  Cardiovascular: Normal rate and regular  rhythm.  No murmur heard. Pulmonary/Chest: Effort normal. No respiratory distress. She has no rales. She exhibits no tenderness.  Abdominal: Soft. She exhibits no distension. There is no abdominal tenderness.  Palpable abdominal wall mass,  Colostomy  Musculoskeletal:        General: No edema. Normal range of motion.     Cervical back: Normal range of motion and neck supple.  Neurological: She is alert and oriented to person, place, and time. No cranial nerve deficit. She exhibits normal muscle  tone. Coordination normal.  Skin: Skin is warm and dry. She is not diaphoretic. No erythema.  Left chest wall Mediport  Psychiatric: Affect normal.      LABORATORY DATA: I have personally reviewed the data as listed: CBC Latest Ref Rng & Units 12/25/2019 12/24/2019 12/24/2019  WBC 4.0 - 10.5 K/uL 13.7(H) 8.2 11.1(H)  Hemoglobin 12.0 - 15.0 g/dL 11.9(L) 13.8 12.4  Hematocrit 36.0 - 46.0 % 38.6 44.0 39.6  Platelets 150 - 400 K/uL 335 418(H) 379   CMP Latest Ref Rng & Units 12/25/2019 12/24/2019 12/24/2019  Glucose 70 - 99 mg/dL 143(H) 125(H) 94  BUN 8 - 23 mg/dL _0 Creatinine 0.44 - 1.00 mg/dL 0.74 0.69 0.66  Sodium 135 - 145 mmol/L 138 139 134(L)  Potassium 3.5 - 5.1 mmol/L 4.8 4.5 4.4  Chloride 98 - 111 mmol/L 102 104 100  CO2 22 - 32 mmol/L _1 Calcium 8.9 - 10.3 mg/dL 8.9 8.6(L) 8.3(L)  Total Protein 6.5 - 8.1 g/dL - - 7.1  Total Bilirubin 0.3 - 1.2 mg/dL - - 0.4  Alkaline Phos 38 - 126 U/L - - 72  AST 15 - 41 U/L - - 12(L)  ALT 0 - 44 U/L - - 10   RADIOGRAPHIC STUDIES: I have personally reviewed the radiological images as listed and agreed with the findings in the report. CT ABDOMEN PELVIS WO CONTRAST  Addendum Date: 12/08/2019   ADDENDUM REPORT: 12/08/2019 16:58 ADDENDUM: Upon further review, 11 mm irregular nodular density is seen adjacent to the colonic surgical anastomosis, but may be slightly enlarged compared to prior exam. There is also noted 18 mm nodular density in  anterior abdominal wall centrally which may be slightly enlarged compared to prior exam. Etiology of these is unknown, but possible malignancy or metastatic disease cannot be excluded. PET scan is recommended for further evaluation. Bilateral adrenal masses are noted most consistent with adenomas. Mild pneumoperitoneum is noted consistent with recent surgery. Aortic Atherosclerosis (ICD10-I70.0). Electronically Signed   By: Marijo Conception M.D.   On: 12/08/2019 16:58   Result Date: 12/08/2019 CLINICAL DATA:  Acute generalized abdominal pain. EXAM: CT ABDOMEN AND PELVIS WITHOUT CONTRAST TECHNIQUE: Multidetector CT imaging of the abdomen and pelvis was performed following the standard protocol without IV contrast. COMPARISON:  June 21, 2019. FINDINGS: Lower chest: Minimal right posterior basilar subsegmental atelectasis is noted. Hepatobiliary: No focal liver abnormality is seen. No gallstones, gallbladder wall thickening, or biliary dilatation. Pancreas: Unremarkable. No pancreatic ductal dilatation or surrounding inflammatory changes. Spleen: Normal in size without focal abnormality. Adrenals/Urinary Tract: Stable bilateral adrenal masses are noted most consistent with adenomas. No hydronephrosis or renal obstruction is noted. No renal or ureteral calculi are noted. Urinary bladder is unremarkable. Stomach/Bowel: Status post right hemicolectomy. The stomach is unremarkable. There is no evidence of bowel obstruction or inflammation. Vascular/Lymphatic: Aortic atherosclerosis. No enlarged abdominal or pelvic lymph nodes. Reproductive: Uterus and bilateral adnexa are unremarkable. Other: Mild pneumoperitoneum is noted consistent with recent surgery. No ascites is noted. Musculoskeletal: No acute or significant osseous findings. Electronically Signed: By: Marijo Conception M.D. On: 12/08/2019 13:14   DG Ribs Unilateral Left  Result Date: 10/06/2019 CLINICAL DATA:  Left rib pain, marked with BB EXAM: LEFT RIBS -  2 VIEW COMPARISON:  Radiograph 09/01/2018, CT 05/20/2018 FINDINGS: Radiopaque BB marker projects over the lateral aspect of the tenth rib. There is a minimally displaced posterolateral left seventh rib fracture. No other evident rib fractures. No pneumothorax  or effusion. Chronic bronchitic and coarsened interstitial changes in the lungs are similar to prior. Portions of the right lung are collimated. The aorta is calcified. The remaining cardiomediastinal contours are unremarkable. IMPRESSION: 1. Minimally displaced posterolateral left seventh rib fracture. 2. No pneumothorax or effusion. 3. Chronic bronchitic and coarsened interstitial changes in the lungs are similar to prior. 4.  Aortic Atherosclerosis (ICD10-I70.0). Electronically Signed   By: Lovena Le M.D.   On: 10/06/2019 05:08   NM Pulmonary Perf and Vent  Result Date: 12/09/2019 CLINICAL DATA:  Positive D-dimer study. History of breast and colon carcinoma. COPD EXAM: NUCLEAR MEDICINE VENTILATION - PERFUSION LUNG SCAN VIEWS: Anterior, posterior, left lateral, right lateral, RPO, RAO, LPO, LAO-ventilation and perfusion RADIOPHARMACEUTICALS:  30.52 mCi of Tc-34mDTPA aerosol inhalation and 3.86 mCi Tc987mAA IV COMPARISON:  Chest radiograph December 08, 2019 FINDINGS: Ventilation: Radiotracer uptake on the ventilation study is homogeneous and symmetric bilaterally. No appreciable ventilation defects. Perfusion: Radiotracer uptake on the perfusion study is homogeneous and symmetric bilaterally. No appreciable perfusion defects. IMPRESSION: No appreciable ventilation or perfusion defects. Very low probability of pulmonary embolus. Electronically Signed   By: WiLowella GripII M.D.   On: 12/09/2019 14:14   DG Chest Portable 1 View  Result Date: 12/24/2019 CLINICAL DATA:  Shortness of breath and urticaria EXAM: PORTABLE CHEST 1 VIEW COMPARISON:  December 08, 2019 FINDINGS: Port-A-Cath tip is in the superior vena cava. No pneumothorax. The lungs are  clear. Heart is upper normal in size with pulmonary vascularity normal. No adenopathy. There is aortic atherosclerosis. No bone lesions. IMPRESSION: Stable Port-A-Cath position. No pneumothorax. No edema or airspace opacity. Stable cardiac silhouette. No adenopathy. Aortic Atherosclerosis (ICD10-I70.0). Electronically Signed   By: WiLowella GripII M.D.   On: 12/24/2019 11:45   DG Chest Port 1 View  Result Date: 12/08/2019 CLINICAL DATA:  Left lower quadrant abdominal pain. Surgical procedure yesterday. Recent port placement. EXAM: PORTABLE CHEST 1 VIEW COMPARISON:  CT same day. Chest radiography yesterday. FINDINGS: Power port inserted from a right internal jugular approach. Tip at the SVC RA junction. No pneumothorax. Mild bibasilar atelectasis. Small amount of pneumoperitoneum as shown on the previous CT consistent with yesterday's procedure. IMPRESSION: Mild bibasilar atelectasis. Power port tip at the SVC RA junction. No pneumothorax. Small amount of pneumoperitoneum as shown on the previous CT. Electronically Signed   By: MaNelson Chimes.D.   On: 12/08/2019 16:48   DG CHEST PORT 1 VIEW  Result Date: 12/07/2019 CLINICAL DATA:  Status post Port-A-Cath placement. EXAM: PORTABLE CHEST 1 VIEW COMPARISON:  October 05, 2019. FINDINGS: Stable cardiomediastinal silhouette. No pneumothorax pleural effusion is noted. Mild bibasilar subsegmental atelectasis or scarring is noted. Interval placement of right internal jugular Port-A-Cath with distal tip in expected position of SVC. Atherosclerosis of thoracic aorta is noted. Bony thorax is unremarkable. IMPRESSION: Interval placement of right internal jugular Port-A-Cath with distal tip in expected position of the SVC. No pneumothorax is noted. Electronically Signed   By: JaMarijo Conception.D.   On: 12/07/2019 10:28   DG C-Arm 1-60 Min-No Report  Result Date: 12/07/2019 Fluoroscopy was utilized by the requesting physician.  No radiographic interpretation.    ECHOCARDIOGRAM COMPLETE  Result Date: 12/09/2019    ECHOCARDIOGRAM REPORT   [facility]2019 Medical Rec #:  [id number]   Height:       64.0 in Accession #:    [id number]  Weight:  158.8 lb Date of Birth:  30-Apr-1946     BSA:          1.774 m Patient Age:    44 years      BP:           142/78 mmHg Patient Gender: F             HR:           62 bpm. Exam Location:  [facility] Procedure: 2D Echo, Cardiac Doppler and Color Doppler Indications:    Atrial Fibrillation 427.31  History:        Patient has no prior history of Echocardiogram examinations.                 COPD.  Sonographer:    Sherrie Sport RDCS (AE) Referring Phys: [id number] [facility] Diagnosing      Serafina Royals MD Phys: IMPRESSIONS  1. Left ventricular ejection fraction, by estimation, is 50 to 55%. The left ventricle has low normal function. The left ventricle has no regional wall motion abnormalities. Left ventricular diastolic parameters were normal.  2. Right ventricular systolic function is normal. The right ventricular size is normal.  3. The mitral valve is normal in structure. Trivial mitral valve regurgitation.  4. The aortic valve is normal in structure. Aortic valve regurgitation is not visualized. FINDINGS  Left Ventricle: Left ventricular ejection fraction, by estimation, is 50 to 55%. The left ventricle has low normal function. The left ventricle has no regional wall motion abnormalities. The left ventricular internal cavity size was normal in size. There is no left ventricular hypertrophy. Left ventricular diastolic parameters were normal. Right Ventricle: The right ventricular size is normal. No increase in right ventricular wall thickness. Right ventricular systolic function is normal. Left Atrium: Left atrial size was normal in size. Right Atrium: Right atrial size was normal in size. Pericardium: There is no evidence of pericardial effusion. Mitral Valve: The mitral valve is normal in  structure. Trivial mitral valve regurgitation. Tricuspid Valve: The tricuspid valve is normal in structure. Tricuspid valve regurgitation is trivial. Aortic Valve: The aortic valve is normal in structure. Aortic valve regurgitation is not visualized. Aortic valve mean gradient measures 3.5 mmHg. Aortic valve peak gradient measures 6.0 mmHg. Aortic valve area, by VTI measures 2.29 cm. Pulmonic Valve: The pulmonic valve was normal in structure. Pulmonic valve regurgitation is not visualized. Aorta: The aortic root and ascending aorta are structurally normal, with no evidence of dilitation. IAS/Shunts: No atrial level shunt detected by color flow Doppler.  LEFT VENTRICLE PLAX 2D LVIDd:         4.08 cm  Diastology LVIDs:         2.85 cm  LV e' lateral:   9.36 cm/s LV PW:         1.18 cm  LV E/e' lateral: 11.5 LV IVS:        0.92 cm  LV e' medial:    8.81 cm/s LVOT diam:     2.00 cm  LV E/e' medial:  12.3 LV SV:         56 LV SV Index:   32 LVOT Area:     3.14 cm  RIGHT VENTRICLE RV Basal diam:  4.02 cm LEFT ATRIUM             Index       RIGHT ATRIUM           Index LA diam:  4.40 cm 2.48 cm/m  RA Area:     21.80 cm LA Vol (A2C):   81.2 ml 45.78 ml/m RA Volume:   68.60 ml  38.68 ml/m LA Vol (A4C):   71.0 ml 40.03 ml/m LA Biplane Vol: 75.0 ml 42.29 ml/m  AORTIC VALVE                   PULMONIC VALVE AV Area (Vmax):    2.21 cm    PV Vmax:        0.78 m/s AV Area (Vmean):   2.10 cm    PV Peak grad:   2.4 mmHg AV Area (VTI):     2.29 cm    RVOT Peak grad: 3 mmHg AV Vmax:           122.00 cm/s AV Vmean:          83.250 cm/s AV VTI:            0.244 m AV Peak Grad:      6.0 mmHg AV Mean Grad:      3.5 mmHg LVOT Vmax:         85.70 cm/s LVOT Vmean:        55.700 cm/s LVOT VTI:          0.178 m LVOT/AV VTI ratio: 0.73  AORTA Ao Root diam: 2.60 cm MITRAL VALVE MV Area (PHT): 3.37 cm     SHUNTS MV Decel Time: 225 msec     Systemic VTI:  0.18 m MV E velocity: 108.00 cm/s  Systemic Diam: 2.00 cm MV A velocity:  62.10 cm/s MV E/A ratio:  1.74 Serafina Royals MD Electronically signed by Serafina Royals MD Signature Date/Time: 12/09/2019/1:47:20 PM    Final    [place] CORE BIOPSY (SOFT TISSUE)  Result Date: 10/28/2019 CLINICAL DATA:  History of colon carcinoma and status post transverse colectomy and prior treatment with chemotherapy and radiation therapy. Recent imaging has demonstrated new omental, peritoneal and abdominal wall soft tissue nodules which are hypermetabolic on PET scan and suspicious for recurrent disease. EXAM: ULTRASOUND GUIDED CORE BIOPSY OF ABDOMINAL WALL MASS MEDICATIONS: 1.0 mg IV Versed; 50 mcg IV Fentanyl Total Moderate Sedation Time: 12 minutes. The patient's level of consciousness and physiologic status were continuously monitored during the procedure by Radiology nursing. PROCEDURE: The procedure, risks, benefits, and alternatives were explained to the patient. Questions regarding the procedure were encouraged and answered. The patient understands and consents to the procedure. A time out was performed prior to initiating the procedure. Ultrasound was performed of the abdominal wall and anterior peritoneal cavity. The anterior abdominal wall was prepped with chlorhexidine in a sterile fashion, and a sterile drape was applied covering the operative field. A sterile gown and sterile gloves were used for the procedure. Local anesthesia was provided with 1% Lidocaine. Core biopsy of an anterior abdominal wall mass was performed with an 18 gauge core biopsy device. Four separate core biopsy samples were obtained and submitted in formalin. COMPLICATIONS: None. FINDINGS: Irregular lobulated hypoechoic soft tissue mass is seen involving the right lower abdominal wall and corresponding to one of the lesions that demonstrated increased metabolic activity by PET scan. This area measures approximately 3.2 x 2.1 x 2.6 cm by ultrasound. Solid tissue was obtained with biopsy. IMPRESSION: Ultrasound-guided core biopsy  performed of a right lower abdominal wall mass measuring 3 cm in estimated maximal diameter. Electronically Signed   By: Aletta Edouard M.D.   On: 10/28/2019 12:02  ASSESSMENT/PLAN 74yo female who has recurrent Colon Cancer presents for follow up  Cancer Staging Malignant neoplasm of transverse colon [facility]) Staging form: Colon and Rectum, AJCC 8th Edition - Clinical stage from 04/15/2017: Stage IIIB (cT4a, cN1b, cM0) - Signed by Earlie Server, MD on 04/26/2017  1. Malignant neoplasm of transverse colon (HCC)   2. Parotid nodule   3. Neoplasm related pain   4. Iron deficiency anemia due to chronic blood loss   5. Anaphylaxis, sequela    #Recurrent colon cancer with abdominal wall and mesentery nodule.    BRAF V600 E mutation and ATM mutation. Labs are reviewed and discussed with patient. Patient had anaphylactic reaction to cetuximab. Treatment was started immediately. Patient had recovered from her anaphylactic reaction.  Clinically stable.  Advised patient to continue and finish her tapering course of steroids. I had a lengthy discussion with patient and her daughter regarding management plan for her colon cancer. Recommend to proceed with Encorafinib with combination of Panitumumab. The diagnosis and care plan were discussed with patient in detail.  NCCN guidelines were reviewed and shared with patient.   The goal of treatment which is to palliate disease, disease related symptoms, improve quality of life and hopefully prolong life was highlighted in our discussion.  Chemotherapy education was provided.  We had discussed the composition of chemotherapy regimen, length of chemo cycle, duration of treatment and the time to assess response to treatment.    I explained to the patient the risks and benefits of chemotherapy including all but not limited to allergic reactions,  hair loss, mouth sore, nausea, vomiting, diarrhea, low blood counts, bleeding, heart failure, neuropathy and risk of  life threatening infection and even death, secondary malignancy etc.  Patient voices understanding and willing to proceed chemotherapy.  Given her recent anaphylactic reaction, I recommend patient to take dexamethasone 8 mg the day prior to Panitumumab infusion.  Also take dexamethasone 8 mg for 2 days after infusion.  Hopefully she tolerates Panitumumab. We will see patient in 1 week and start treatments.  Patient and daughter agree with the plan.  Abdominal pain after heavy lifting.  Advised patient not to lift anything more than 5 pounds.  Continue current pain regimen. Iron deficiency anemia, hemoglobin is 11.9.  Continue to monitor.   # History of breast cancer, need mammogram in March 2021 # Parotid nodule with hypermetabolism.  Her treatment for colon cancer takes priority.  Once colon cancer is stabilized, will refer her to ENT.   # Leukocytosis, intermittent monocytosis, she declined bone marrow biopsy on 05/22/2018.  Continue to monitor. Treatments of colon cancer takes priority at this point.   Follow up in 1 week.   Earlie Server, MD, PhD Hematology Oncology [facility] Pager- [phone] 12/31/19

## 2019-12-31 NOTE — Progress Notes (Signed)
Patient here for follow up. Pt reports feeling nauseated today.

## 2020-01-03 ENCOUNTER — Other Ambulatory Visit: Payer: Self-pay | Admitting: Family Medicine

## 2020-01-03 MED ORDER — HYDROCODONE-ACETAMINOPHEN 5-325 MG PO TABS: 1.0000 | ORAL_TABLET | Freq: Four times a day (QID) | ORAL | 0 refills | Status: DC | PRN

## 2020-01-03 NOTE — Telephone Encounter (Signed)
Copied from [place] [id number]. Topic: Quick Communication - Rx Refill/Question >> Jan 03, 2020  1:48 PM Rainey Pines A wrote: Medication: HYDROcodone-acetaminophen (NORCO/VICODIN) 5-325 MG tablet  Has the patient contacted their pharmacy? Yes (Agent: If no, request that the patient contact the pharmacy for the refill.) (Agent: If yes, when and what did the pharmacy advise?)Contact PCP  Preferred Pharmacy (with phone number or street name): [id number] - [place], [place] - [address]  Phone:  [phone] Fax:  [phone]     Agent: Please be advised that RX refills may take up to 3 business days. We ask that you follow-up with your pharmacy.

## 2020-01-06 ENCOUNTER — Other Ambulatory Visit: Payer: Self-pay

## 2020-01-06 NOTE — Progress Notes (Signed)
Patient reports abdominal pain that started at the top of stomach that radiates to bottom of stomach.  Also has a "knot" at the top of her stomach that is getting bigger.  She took a pain pill this morning that relieved the pain.  PCP, Dr. Caryn Section, is the MD that prescribes her pain meds and the last refill on 4/5 with a decreased dose from 7.5-325 to 5-325.

## 2020-01-07 ENCOUNTER — Inpatient Hospital Stay: Payer: Medicare HMO

## 2020-01-07 ENCOUNTER — Encounter: Payer: Self-pay | Admitting: Oncology

## 2020-01-07 ENCOUNTER — Inpatient Hospital Stay (HOSPITAL_BASED_OUTPATIENT_CLINIC_OR_DEPARTMENT_OTHER): Payer: Medicare HMO | Admitting: Oncology

## 2020-01-07 VITALS — BP 174/80 | HR 76 | Temp 97.7°F | Resp 18 | Wt 159.9 lb

## 2020-01-07 DIAGNOSIS — Z5111 Encounter for antineoplastic chemotherapy: Secondary | ICD-10-CM

## 2020-01-07 DIAGNOSIS — K118 Other diseases of salivary glands: Secondary | ICD-10-CM | POA: Diagnosis not present

## 2020-01-07 DIAGNOSIS — K56609 Unspecified intestinal obstruction, unspecified as to partial versus complete obstruction: Secondary | ICD-10-CM | POA: Diagnosis not present

## 2020-01-07 DIAGNOSIS — D500 Iron deficiency anemia secondary to blood loss (chronic): Secondary | ICD-10-CM | POA: Diagnosis not present

## 2020-01-07 DIAGNOSIS — G893 Neoplasm related pain (acute) (chronic): Secondary | ICD-10-CM | POA: Diagnosis not present

## 2020-01-07 DIAGNOSIS — C184 Malignant neoplasm of transverse colon: Secondary | ICD-10-CM | POA: Diagnosis not present

## 2020-01-07 DIAGNOSIS — R634 Abnormal weight loss: Secondary | ICD-10-CM | POA: Diagnosis not present

## 2020-01-07 DIAGNOSIS — D509 Iron deficiency anemia, unspecified: Secondary | ICD-10-CM | POA: Diagnosis not present

## 2020-01-07 DIAGNOSIS — Z5112 Encounter for antineoplastic immunotherapy: Secondary | ICD-10-CM | POA: Diagnosis not present

## 2020-01-07 DIAGNOSIS — Z95828 Presence of other vascular implants and grafts: Secondary | ICD-10-CM

## 2020-01-07 DIAGNOSIS — M7989 Other specified soft tissue disorders: Secondary | ICD-10-CM | POA: Diagnosis not present

## 2020-01-07 DIAGNOSIS — I4891 Unspecified atrial fibrillation: Secondary | ICD-10-CM | POA: Diagnosis not present

## 2020-01-07 LAB — COMPREHENSIVE METABOLIC PANEL
ALT: 13 U/L (ref 0–44)
AST: 15 U/L (ref 15–41)
Albumin: 3.7 g/dL (ref 3.5–5.0)
Alkaline Phosphatase: 59 U/L (ref 38–126)
Anion gap: 11 (ref 5–15)
BUN: 32 mg/dL — ABNORMAL HIGH (ref 8–23)
CO2: 23 mmol/L (ref 22–32)
Calcium: 8.8 mg/dL — ABNORMAL LOW (ref 8.9–10.3)
Chloride: 99 mmol/L (ref 98–111)
Creatinine, Ser: 0.73 mg/dL (ref 0.44–1.00)
GFR calc Af Amer: >60 mL/min (ref >60)
GFR calc non Af Amer: >60 mL/min (ref >60)
Glucose, Bld: 199 mg/dL — ABNORMAL HIGH (ref 70–99)
Potassium: 4 mmol/L (ref 3.5–5.1)
Sodium: 133 mmol/L — ABNORMAL LOW (ref 135–145)
Total Bilirubin: 0.5 mg/dL (ref 0.3–1.2)
Total Protein: 7.1 g/dL (ref 6.5–8.1)

## 2020-01-07 LAB — CBC WITH DIFFERENTIAL/PLATELET
Abs Immature Granulocytes: 0.16 10*3/uL — ABNORMAL HIGH (ref 0.00–0.07)
Basophils Absolute: 0 10*3/uL (ref 0.0–0.1)
Basophils Relative: 0 %
Eosinophils Absolute: 0 10*3/uL (ref 0.0–0.5)
Eosinophils Relative: 0 %
HCT: 38.9 % (ref 36.0–46.0)
Hemoglobin: 12.5 g/dL (ref 12.0–15.0)
Immature Granulocytes: 1 %
Lymphocytes Relative: 7 %
Lymphs Abs: 1.1 10*3/uL (ref 0.7–4.0)
MCH: 27.2 pg (ref 26.0–34.0)
MCHC: 32.1 g/dL (ref 30.0–36.0)
MCV: 84.7 fL (ref 80.0–100.0)
Monocytes Absolute: 0.5 10*3/uL (ref 0.1–1.0)
Monocytes Relative: 3 %
Neutro Abs: 15.3 10*3/uL — ABNORMAL HIGH (ref 1.7–7.7)
Neutrophils Relative %: 89 %
Platelets: 344 10*3/uL (ref 150–400)
RBC: 4.59 MIL/uL (ref 3.87–5.11)
RDW: 19.9 % — ABNORMAL HIGH (ref 11.5–15.5)
WBC: 17.1 10*3/uL — ABNORMAL HIGH (ref 4.0–10.5)
nRBC: 0 % (ref 0.0–0.2)

## 2020-01-07 LAB — MAGNESIUM: Magnesium: 1.9 mg/dL (ref 1.7–2.4)

## 2020-01-07 MED ORDER — HEPARIN SOD (PORK) LOCK FLUSH 100 UNIT/ML IV SOLN
INTRAVENOUS | Status: AC
  Filled 2020-01-07: qty 5

## 2020-01-07 MED ORDER — SODIUM CHLORIDE 0.9 % IV SOLN
Freq: Once | INTRAVENOUS | Status: AC
  Filled 2020-01-07: qty 250

## 2020-01-07 MED ORDER — SODIUM CHLORIDE 0.9 % IV SOLN
20.0000 mg | Freq: Once | INTRAVENOUS | Status: AC
  Administered 2020-01-07: 20 mg via INTRAVENOUS
  Filled 2020-01-07: qty 20

## 2020-01-07 MED ORDER — SODIUM CHLORIDE 0.9% FLUSH
10.0000 mL | Freq: Once | INTRAVENOUS | Status: AC
  Administered 2020-01-07: 10 mL via INTRAVENOUS
  Filled 2020-01-07: qty 10

## 2020-01-07 MED ORDER — HEPARIN SOD (PORK) LOCK FLUSH 100 UNIT/ML IV SOLN
500.0000 [IU] | Freq: Once | INTRAVENOUS | Status: AC | PRN
  Administered 2020-01-07: 12:00:00 500 [IU]
  Filled 2020-01-07: qty 5

## 2020-01-07 MED ORDER — SODIUM CHLORIDE 0.9 % IV SOLN
6.0000 mg/kg | Freq: Once | INTRAVENOUS | Status: AC
  Administered 2020-01-07: 400 mg via INTRAVENOUS
  Filled 2020-01-07: qty 20

## 2020-01-07 NOTE — Progress Notes (Signed)
Patient had anaphylaxis to cetuximab, giving dex 20mg  with vectibix this time and see how patient does per MD

## 2020-01-07 NOTE — Progress Notes (Signed)
[facility] Follow up  Visit:  Patient Care Team: Birdie Sons, MD as PCP - General (Family Medicine) Earlie Server, MD as Consulting Physician (Oncology) Noreene Filbert, MD as Referring Physician (Radiation Oncology) Jules Husbands, MD as Consulting Physician (General Surgery) Benedetto Goad, RN as Registered Nurse Cathi Roan, [facility] (Pharmacist) Clent Jacks, RN as Oncology Nurse Navigator  PURPOSE OF VISIT: Stage IV colon cancer  HISTORY OF PRESENTING ILLNESS: Kimberly [name] 74 y.o. female with PMH listed as below presents for follow up for the management of Stage IIIB Colon Cancer Patient reports a remote history of breast cancer s/p lumpectomy and radiation treatments.   Patient had been having abdominal pain, weight loss and bloating.  CT scan showed partial obstruction at the level of transverse colon and ileocolic mesenteric adenopathy. She was prepared for a colonoscopy on 04/15/2017. She started to have worsened abdominal pain, nausea vomiting, unable to maintain oral intake and was sent to ED. She was found to have bowel obstruction and underwent  exploratory laparotomy with transverse colectomy, with end colostomy and mucous fistula formation for obstructing colon lesion. Differential diagnosis prior to surgery was metastatic breast cancer in colon vs colon cancer. The patient did have a colonoscopy 2 years ago without the mass being present at that time but did have 2 small polyps in the transverse colon that were removed.  04/15/2017 Patient underwent transverse colon resection.  Pathology showed T4aN1 moderate differentiated adenocarcinoma, Grade 2, tumor invades visceral peritoneum, LVI present, negative margin, 3/16 lymph nodes involved with cancer. Low probability of MSI-H.   # Genetic test INVITAE came back negative. No pathogenetic sequence variants or deletions/duplications identified. Results was scanned to [employer] (a copy of the report was  provided to patient and her daughter)  # # baseline CEA is 2.1 # colostomy reversal on 06/25/2018, subsequently developed C. difficile colitis x 2.  Multiple abdominal CT abdomen and pelvis for obtained, not listed here #09/01/2018 CT abdomen pelvis showed abdominal wall abscess,  percutaneous drain placement .treatment with antibiotics with Augmentin 10 days course.  #09/14/2018 CT abdomen/pelvis showed resolution of abscess, no intraabdominal abscess.  Mediport was discontinued. # 08/13/2019 colonoscopy showed localized area of mildly thickened folds of the mucosa was found in the sigmoid colon. Biopsy pathology showed benign mucosa with lymphoid aggregates and focal minimal inflammation. No dysplasia.   TREATMENT:  04/15/2017 transverse colon resection 05/27/2017- 11/17/2017 adjuvant FOLFOX, Oxaliplatin was discontinued after 3 cycles due to worsening of pre-existing neuropathy. Finished another 9 cycles of 5-FU.  March -April 2019 adjuvant RT.  06/25/2018 Colostomy Reversal.   09/21/2019 PET scan showed 2 small right abdominal mesenteric soft tissue nodule which showed no significant change in size compared to recent CT but hypermetabolic.  Peritoneal metastatic disease cannot be excluded. 1.1 cm hypermetabolic nodule in the right parotid gland suspicious for primary parotid neoplasm.  08/27/2020, CT-guided biopsy of abdominal wall mass was obtained. Results showed metastatic adenocarcinoma, compatible with colon primary.  # 12/07/2019 She underwent surgery for laparoscopic evaluation of peritoneum and placement of Mediport. # 12/08/2019- 12/10/2019  She was admitted due to atrial fibrillation.  Patient was started on anticoagulation with Eliquis 5 mg twice daily.  Patient was reverted back to normal sinus rhythm.  She is on metoprolol for rate control.  # 12/24/2019 She had anaphylactic reaction to Cetuximab She got epinephrine. Was not intubated.   INTERVAL HISTORY 74 y.o. female  presents for  follow-up of recurrent colon cancer.  She was accompanied by her daughter.  Feels well today.  She reports feeling "throat scratching/closing " sensation when she eats cabbage, pecan. She felt "it about to happen", but no true allergic reactions. She   Intermittent right lower quadrant pain, took a pain pill this morning. She got pain medication from her PCP Dr.Fisher.    Review of Systems  Constitutional: Positive for fatigue. Negative for appetite change, chills, fever and unexpected weight change.  HENT:   Negative for hearing loss, lump/mass, nosebleeds and voice change.   Eyes: Negative for eye problems.  Respiratory: Negative for chest tightness, cough and shortness of breath.   Cardiovascular: Positive for leg swelling. Negative for chest pain.  Gastrointestinal: Negative for abdominal distention, abdominal pain, blood in stool, constipation, diarrhea and nausea.  Endocrine: Negative for hot flashes.  Genitourinary: Negative for difficulty urinating, dysuria and frequency.   Musculoskeletal: Negative for arthralgias, gait problem and myalgias.  Skin: Negative for itching, rash and wound.  Neurological: Negative for dizziness, extremity weakness, gait problem and headaches.       Chronic numbness and tingling of fingertips and toes.  Hematological: Negative for adenopathy. Does not bruise/bleed easily.  Psychiatric/Behavioral: Negative for confusion, decreased concentration and depression. The patient is not nervous/anxious.       MEDICAL HISTORY: Past Medical History:  Diagnosis Date  . Breast cancer, left ([place]) 2004   Lumpectomy and rad tx's.  . Chronic back pain   . Colon cancer ([place]) 2018  . COPD (chronic obstructive pulmonary disease) ([place])   . Degenerative disc disease, lumbar 04/2013  . Family history of adverse reaction to anesthesia    son arrested after anesthesia  . GERD (gastroesophageal reflux disease)   . Iron deficiency anemia 05/27/2017  . Personal history of  chemotherapy 2018   Colon  . Personal history of radiation therapy    Breast 2004  . Personal history of radiation therapy 2018   Colon  . Postprocedural intraabdominal abscess 09/01/2018  . Small bowel obstruction ([place]) 04/16/2017  . Wears dentures    full upper    SURGICAL HISTORY: Past Surgical History:  Procedure Laterality Date  . APPENDECTOMY  1965  . BREAST EXCISIONAL BIOPSY Left 08/01/2003   lumpectomy rad 11/04-2/28/2005  . BREAST SURGERY    . CESAREAN SECTION     x3  . COLON SURGERY    . COLONOSCOPY W/ POLYPECTOMY    . COLONOSCOPY WITH PROPOFOL N/A 04/09/2018   Procedure: COLONOSCOPY WITH PROPOFOL;  Surgeon: Virgel Manifold, MD;  Location: [facility];  Service: Gastroenterology;  Laterality: N/A;  . COLONOSCOPY WITH PROPOFOL N/A 08/13/2019   Procedure: COLONOSCOPY WITH PROPOFOL;  Surgeon: Virgel Manifold, MD;  Location: [facility];  Service: Endoscopy;  Laterality: N/A;  . COLOSTOMY TAKEDOWN N/A 06/25/2018   Procedure: COLOSTOMY TAKEDOWN;  Surgeon: Jules Husbands, MD;  Location: [facility];  Service: General;  Laterality: N/A;  . ESOPHAGOGASTRODUODENOSCOPY (EGD) WITH PROPOFOL N/A 04/04/2017   Procedure: ESOPHAGOGASTRODUODENOSCOPY (EGD) WITH PROPOFOL;  Surgeon: [name], MD;  Location: [facility];  Service: Endoscopy;  Laterality: N/A;  . FRACTURE SURGERY    . HIP FRACTURE SURGERY Left 01/25/2012  . LAPAROSCOPY N/A 12/07/2019   Procedure: LAPAROSCOPY DIAGNOSTIC;  Surgeon: Jules Husbands, MD;  Location: [facility];  Service: General;  Laterality: N/A;  . LAPAROTOMY N/A 04/15/2017   Procedure: EXPLORATORY LAPAROTOMY;  Surgeon: Clayburn Pert, MD;  Location: [facility];  Service: General;  Laterality: N/A;  . NECK SURGERY  12/2011  .  PARASTOMAL HERNIA REPAIR N/A 12/03/2017   Procedure: HERNIA REPAIR PARASTOMAL;  Surgeon: Jules Husbands, MD;  Location: [facility];  Service: General;  Laterality: N/A;  . PARASTOMAL HERNIA REPAIR N/A 06/25/2018    Procedure: HERNIA REPAIR PARASTOMAL;  Surgeon: Jules Husbands, MD;  Location: [facility];  Service: General;  Laterality: N/A;  . PORTACATH PLACEMENT Right 05/21/2017   Procedure: INSERTION PORT-A-CATH;  Surgeon: Clayburn Pert, MD;  Location: [facility];  Service: General;  Laterality: Right;  . PORTACATH PLACEMENT Right 12/07/2019   Procedure: INSERTION PORT-A-CATH;  Surgeon: Jules Husbands, MD;  Location: [facility];  Service: General;  Laterality: Right;  . WRIST FRACTURE SURGERY      SOCIAL HISTORY: Social History   Socioeconomic History  . Marital status: Divorced    Spouse name: Not on file  . Number of children: 3  . Years of education: Not on file  . Highest education level: 7th grade  Occupational History  . Occupation: retired  Tobacco Use  . Smoking status: Former Smoker    Packs/day: 0.25    Years: 55.00    Pack years: 13.75    Types: Cigarettes    Quit date: 05/21/2017    Years since quitting: 2.6  . Smokeless tobacco: Never Used  . Tobacco comment: started age 29 1/2 to 1 ppd  Substance and Sexual Activity  . Alcohol use: Yes    Alcohol/week: 0.0 standard drinks    Comment: occasionally drinks beer  . Drug use: No  . Sexual activity: Never  Other Topics Concern  . Not on file  Social History Narrative   Lives at home alone   Social Determinants of Health   Financial Resource Strain: Medium Risk  . Difficulty of Paying Living Expenses: Somewhat hard  Food Insecurity:   . Worried About Charity fundraiser in the Last Year:   . Arboriculturist in the Last Year:   Transportation Needs:   . Film/video editor (Medical):   Marland Kitchen Lack of Transportation (Non-Medical):   Physical Activity: Inactive  . Days of Exercise per Week: 0 days  . Minutes of Exercise per Session: 0 min  Stress:   . Feeling of Stress :   Social Connections: Unknown  . Frequency of Communication with Friends and Family: Patient refused  . Frequency of Social Gatherings with Friends and  Family: Patient refused  . Attends Religious Services: Patient refused  . Active Member of Clubs or Organizations: Patient refused  . Attends Archivist Meetings: Patient refused  . Marital Status: Patient refused  Intimate Partner Violence: Unknown  . Fear of Current or Ex-Partner: Patient refused  . Emotionally Abused: Patient refused  . Physically Abused: Patient refused  . Sexually Abused: Patient refused    FAMILY HISTORY Family History  Problem Relation Age of Onset  . Diabetes Mother        type 2  . Coronary artery disease Mother   . Deep vein thrombosis Mother   . Colon cancer Father   . Asthma Brother   . Diabetes Brother        type 2  . Breast cancer Neg Hx     ALLERGIES:  is allergic to erbitux [cetuximab]; betadine [povidone iodine]; iodine; other; and sinus formula [cholestatin].  MEDICATIONS:  Current Outpatient Medications  Medication Sig Dispense Refill  . apixaban (ELIQUIS) 5 MG TABS tablet Take 1 tablet (5 mg total) by mouth 2 (two) times daily. 60 tablet 0  .  budesonide-formoterol (SYMBICORT) 80-4.5 MCG/ACT inhaler Inhale 2 puffs into the lungs 2 (two) times daily. 3 Inhaler 3  . calcium citrate-vitamin D (CITRACAL+D) 315-200 MG-UNIT tablet Take 1 tablet by mouth daily.    . Cholecalciferol (VITAMIN D3) 50 MCG (2000 UT) TABS Take 2,000 Units by mouth daily.    Marland Kitchen dexamethasone (DECADRON) 4 MG tablet Take 2 tablets (8 mg total) by mouth daily. Take 2 tablets (8 mg) the day before treatment. Then, 2 tabs (25m) daily for 2 days after treatment. 6 tablet 0  . diphenhydrAMINE (BENADRYL) 25 MG tablet Take 2 tablets (50 mg total) by mouth at bedtime as needed for itching or allergies. 30 tablet 0  . EPINEPHrine 0.3 mg/0.3 mL IJ SOAJ injection Inject 0.3 mLs (0.3 mg total) into the muscle as needed for anaphylaxis. 1 each 0  . gabapentin (NEURONTIN) 400 MG capsule TAKE 1 CAPSULE THREE TIMES DAILY (Patient taking differently: Take 600 mg by mouth 3 (three)  times daily. ) 270 capsule 1  . HYDROcodone-acetaminophen (NORCO/VICODIN) 5-325 MG tablet Take 1-2 tablets by mouth every 6 (six) hours as needed for moderate pain. 20 tablet 0  . meloxicam (MOBIC) 15 MG tablet TAKE 1 TABLET EVERY DAY AS NEEDED (Patient taking differently: Take 15 mg by mouth at bedtime. ) 90 tablet 3  . metoprolol tartrate (LOPRESSOR) 25 MG tablet Take 1 tablet (25 mg total) by mouth 2 (two) times daily. 60 tablet 0  . Multiple Vitamins-Minerals (WOMENS MULTI VITAMIN & MINERAL PO) Take 2 tablets by mouth daily.     .Marland Kitchenomeprazole (PRILOSEC) 20 MG capsule Take 1 capsule (20 mg total) by mouth daily. 90 capsule 4  . pravastatin (PRAVACHOL) 40 MG tablet TAKE 1 TABLET EVERY DAY (Patient taking differently: Take 40 mg by mouth daily. ) 90 tablet 4  . Probiotic Product (PROBIOTIC DAILY PO) Take 1 capsule by mouth daily.     .Marland Kitchensenna-docusate (SENOKOT-S) 8.6-50 MG tablet Take 2 tablets by mouth daily. 60 tablet 1  . encorafenib (BRAFTOVI) 75 MG capsule Take 4 capsules (300 mg total) by mouth daily. (Patient not taking: Reported on 01/06/2020) 120 capsule 11  . predniSONE (DELTASONE) 10 MG tablet Take 437mdaily for 3days,Take 3042maily for 3days,Take 37m19mily for 3days,Take 10mg56mly for 3days, then stop (Patient not taking: Reported on 01/06/2020) 30 tablet 0   No current facility-administered medications for this visit.   Facility-Administered Medications Ordered in Other Visits  Medication Dose Route Frequency Provider Last Rate Last Admin  . 0.9 %  sodium chloride infusion   Intravenous Once Daney Moor, ZEarlie Server     . dexamethasone (DECADRON) 20 mg in sodium chloride 0.9 % 50 mL IVPB  20 mg Intravenous Once Alquan Morrish, ZEarlie Server     . heparin lock flush 100 unit/mL  500 Units Intracatheter Once PRN Rashel Okeefe, ZEarlie Server     . panitumumab (VECTIBIX) 400 mg in sodium chloride 0.9 % 100 mL chemo infusion  6 mg/kg (Treatment Plan Recorded) Intravenous Once Jamaiya Tunnell, ZEarlie Server       PHYSICAL EXAMINATION:  ECOG  PERFORMANCE STATUS: 1 - Symptomatic but completely ambulatory  Vitals:   01/07/20 0859  BP: (!) 174/80  Pulse: 76  Resp: 18  Temp: 97.7 F (36.5 C)  SpO2: 95%   Filed Weights   01/07/20 0859  Weight: 159 lb 14.4 oz (72.5 kg)    Physical Exam  Constitutional: She is oriented to person, place, and time. No distress.  HENT:  Head: Normocephalic and atraumatic.  Mouth/Throat: No oropharyngeal exudate.  Eyes: Pupils are equal, round, and reactive to light. EOM are normal. No scleral icterus.  Cardiovascular: Normal rate and regular rhythm.  No murmur heard. Pulmonary/Chest: Effort normal. No respiratory distress. She has no rales. She exhibits no tenderness.  Abdominal: Soft. She exhibits no distension. There is no abdominal tenderness.  Palpable abdominal wall mass,    Musculoskeletal:        General: No edema. Normal range of motion.     Cervical back: Normal range of motion and neck supple.  Neurological: She is alert and oriented to person, place, and time.  Skin: Skin is warm and dry. She is not diaphoretic. No erythema.  Left chest wall Mediport  Psychiatric: Affect normal.      LABORATORY DATA: I have personally reviewed the data as listed: CBC Latest Ref Rng & Units 01/07/2020 12/25/2019 12/24/2019  WBC 4.0 - 10.5 K/uL 17.1(H) 13.7(H) 8.2  Hemoglobin 12.0 - 15.0 g/dL 12.5 11.9(L) 13.8  Hematocrit 36.0 - 46.0 % 38.9 38.6 44.0  Platelets 150 - 400 K/uL 344 335 418(H)   CMP Latest Ref Rng & Units 01/07/2020 12/25/2019 12/24/2019  Glucose 70 - 99 mg/dL 199(H) 143(H) 125(H)  BUN 8 - 23 mg/dL 32(H) 16 17  Creatinine 0.44 - 1.00 mg/dL 0.73 0.74 0.69  Sodium 135 - 145 mmol/L 133(L) 138 139  Potassium 3.5 - 5.1 mmol/L 4.0 4.8 4.5  Chloride 98 - 111 mmol/L 99 102 104  CO2 22 - 32 mmol/L _0 Calcium 8.9 - 10.3 mg/dL 8.8(L) 8.9 8.6(L)  Total Protein 6.5 - 8.1 g/dL 7.1 - -  Total Bilirubin 0.3 - 1.2 mg/dL 0.5 - -  Alkaline Phos 38 - 126 U/L 59 - -  AST 15 - 41 U/L 15 - -   ALT 0 - 44 U/L 13 - -   RADIOGRAPHIC STUDIES: I have personally reviewed the radiological images as listed and agreed with the findings in the report. CT ABDOMEN PELVIS WO CONTRAST  Addendum Date: 12/08/2019   ADDENDUM REPORT: 12/08/2019 16:58 ADDENDUM: Upon further review, 11 mm irregular nodular density is seen adjacent to the colonic surgical anastomosis, but may be slightly enlarged compared to prior exam. There is also noted 18 mm nodular density in anterior abdominal wall centrally which may be slightly enlarged compared to prior exam. Etiology of these is unknown, but possible malignancy or metastatic disease cannot be excluded. PET scan is recommended for further evaluation. Bilateral adrenal masses are noted most consistent with adenomas. Mild pneumoperitoneum is noted consistent with recent surgery. Aortic Atherosclerosis (ICD10-I70.0). Electronically Signed   By: Marijo Conception M.D.   On: 12/08/2019 16:58   Result Date: 12/08/2019 CLINICAL DATA:  Acute generalized abdominal pain. EXAM: CT ABDOMEN AND PELVIS WITHOUT CONTRAST TECHNIQUE: Multidetector CT imaging of the abdomen and pelvis was performed following the standard protocol without IV contrast. COMPARISON:  June 21, 2019. FINDINGS: Lower chest: Minimal right posterior basilar subsegmental atelectasis is noted. Hepatobiliary: No focal liver abnormality is seen. No gallstones, gallbladder wall thickening, or biliary dilatation. Pancreas: Unremarkable. No pancreatic ductal dilatation or surrounding inflammatory changes. Spleen: Normal in size without focal abnormality. Adrenals/Urinary Tract: Stable bilateral adrenal masses are noted most consistent with adenomas. No hydronephrosis or renal obstruction is noted. No renal or ureteral calculi are noted. Urinary bladder is unremarkable. Stomach/Bowel: Status post right hemicolectomy. The stomach is unremarkable. There is no evidence of bowel obstruction or inflammation. Vascular/Lymphatic:  Aortic  atherosclerosis. No enlarged abdominal or pelvic lymph nodes. Reproductive: Uterus and bilateral adnexa are unremarkable. Other: Mild pneumoperitoneum is noted consistent with recent surgery. No ascites is noted. Musculoskeletal: No acute or significant osseous findings. Electronically Signed: By: Marijo Conception M.D. On: 12/08/2019 13:14   NM Pulmonary Perf and Vent  Result Date: 12/09/2019 CLINICAL DATA:  Positive D-dimer study. History of breast and colon carcinoma. COPD EXAM: NUCLEAR MEDICINE VENTILATION - PERFUSION LUNG SCAN VIEWS: Anterior, posterior, left lateral, right lateral, RPO, RAO, LPO, LAO-ventilation and perfusion RADIOPHARMACEUTICALS:  30.52 mCi of Tc-62mDTPA aerosol inhalation and 3.86 mCi Tc916mAA IV COMPARISON:  Chest radiograph December 08, 2019 FINDINGS: Ventilation: Radiotracer uptake on the ventilation study is homogeneous and symmetric bilaterally. No appreciable ventilation defects. Perfusion: Radiotracer uptake on the perfusion study is homogeneous and symmetric bilaterally. No appreciable perfusion defects. IMPRESSION: No appreciable ventilation or perfusion defects. Very low probability of pulmonary embolus. Electronically Signed   By: WiLowella GripII M.D.   On: 12/09/2019 14:14   DG Chest Portable 1 View  Result Date: 12/24/2019 CLINICAL DATA:  Shortness of breath and urticaria EXAM: PORTABLE CHEST 1 VIEW COMPARISON:  December 08, 2019 FINDINGS: Port-A-Cath tip is in the superior vena cava. No pneumothorax. The lungs are clear. Heart is upper normal in size with pulmonary vascularity normal. No adenopathy. There is aortic atherosclerosis. No bone lesions. IMPRESSION: Stable Port-A-Cath position. No pneumothorax. No edema or airspace opacity. Stable cardiac silhouette. No adenopathy. Aortic Atherosclerosis (ICD10-I70.0). Electronically Signed   By: WiLowella GripII M.D.   On: 12/24/2019 11:45   DG Chest Port 1 View  Result Date: 12/08/2019 CLINICAL DATA:  Left  lower quadrant abdominal pain. Surgical procedure yesterday. Recent port placement. EXAM: PORTABLE CHEST 1 VIEW COMPARISON:  CT same day. Chest radiography yesterday. FINDINGS: Power port inserted from a right internal jugular approach. Tip at the SVC RA junction. No pneumothorax. Mild bibasilar atelectasis. Small amount of pneumoperitoneum as shown on the previous CT consistent with yesterday's procedure. IMPRESSION: Mild bibasilar atelectasis. Power port tip at the SVC RA junction. No pneumothorax. Small amount of pneumoperitoneum as shown on the previous CT. Electronically Signed   By: MaNelson Chimes.D.   On: 12/08/2019 16:48   DG CHEST PORT 1 VIEW  Result Date: 12/07/2019 CLINICAL DATA:  Status post Port-A-Cath placement. EXAM: PORTABLE CHEST 1 VIEW COMPARISON:  October 05, 2019. FINDINGS: Stable cardiomediastinal silhouette. No pneumothorax pleural effusion is noted. Mild bibasilar subsegmental atelectasis or scarring is noted. Interval placement of right internal jugular Port-A-Cath with distal tip in expected position of SVC. Atherosclerosis of thoracic aorta is noted. Bony thorax is unremarkable. IMPRESSION: Interval placement of right internal jugular Port-A-Cath with distal tip in expected position of the SVC. No pneumothorax is noted. Electronically Signed   By: JaMarijo Conception.D.   On: 12/07/2019 10:28   DG C-Arm 1-60 Min-No Report  Result Date: 12/07/2019 Fluoroscopy was utilized by the requesting physician.  No radiographic interpretation.   ECHOCARDIOGRAM COMPLETE  Result Date: 12/09/2019    ECHOCARDIOGRAM REPORT   [facility]2019 Medical Rec #:  [id number]   Height:       64.0 in Accession #:    [id number]  Weight:       158.8 lb Date of Birth:  11Mar 20, 1947   BSA:          1.774 m Patient Age:  73 years      BP:           142/78 mmHg Patient Gender: F             HR:           62 bpm. Exam Location:  [facility] Procedure: 2D Echo, Cardiac Doppler and  Color Doppler Indications:    Atrial Fibrillation 427.31  History:        Patient has no prior history of Echocardiogram examinations.                 COPD.  Sonographer:    Sherrie Sport RDCS (AE) Referring Phys: [id number] [facility] Diagnosing      Serafina Royals MD Phys: IMPRESSIONS  1. Left ventricular ejection fraction, by estimation, is 50 to 55%. The left ventricle has low normal function. The left ventricle has no regional wall motion abnormalities. Left ventricular diastolic parameters were normal.  2. Right ventricular systolic function is normal. The right ventricular size is normal.  3. The mitral valve is normal in structure. Trivial mitral valve regurgitation.  4. The aortic valve is normal in structure. Aortic valve regurgitation is not visualized. FINDINGS  Left Ventricle: Left ventricular ejection fraction, by estimation, is 50 to 55%. The left ventricle has low normal function. The left ventricle has no regional wall motion abnormalities. The left ventricular internal cavity size was normal in size. There is no left ventricular hypertrophy. Left ventricular diastolic parameters were normal. Right Ventricle: The right ventricular size is normal. No increase in right ventricular wall thickness. Right ventricular systolic function is normal. Left Atrium: Left atrial size was normal in size. Right Atrium: Right atrial size was normal in size. Pericardium: There is no evidence of pericardial effusion. Mitral Valve: The mitral valve is normal in structure. Trivial mitral valve regurgitation. Tricuspid Valve: The tricuspid valve is normal in structure. Tricuspid valve regurgitation is trivial. Aortic Valve: The aortic valve is normal in structure. Aortic valve regurgitation is not visualized. Aortic valve mean gradient measures 3.5 mmHg. Aortic valve peak gradient measures 6.0 mmHg. Aortic valve area, by VTI measures 2.29 cm. Pulmonic Valve: The pulmonic valve was normal in structure. Pulmonic valve  regurgitation is not visualized. Aorta: The aortic root and ascending aorta are structurally normal, with no evidence of dilitation. IAS/Shunts: No atrial level shunt detected by color flow Doppler.  LEFT VENTRICLE PLAX 2D LVIDd:         4.08 cm  Diastology LVIDs:         2.85 cm  LV e' lateral:   9.36 cm/s LV PW:         1.18 cm  LV E/e' lateral: 11.5 LV IVS:        0.92 cm  LV e' medial:    8.81 cm/s LVOT diam:     2.00 cm  LV E/e' medial:  12.3 LV SV:         56 LV SV Index:   32 LVOT Area:     3.14 cm  RIGHT VENTRICLE RV Basal diam:  4.02 cm LEFT ATRIUM             Index       RIGHT ATRIUM           Index LA diam:        4.40 cm 2.48 cm/m  RA Area:     21.80 cm LA Vol (A2C):   81.2 ml 45.78 ml/m RA Volume:   68.60  ml  38.68 ml/m LA Vol (A4C):   71.0 ml 40.03 ml/m LA Biplane Vol: 75.0 ml 42.29 ml/m  AORTIC VALVE                   PULMONIC VALVE AV Area (Vmax):    2.21 cm    PV Vmax:        0.78 m/s AV Area (Vmean):   2.10 cm    PV Peak grad:   2.4 mmHg AV Area (VTI):     2.29 cm    RVOT Peak grad: 3 mmHg AV Vmax:           122.00 cm/s AV Vmean:          83.250 cm/s AV VTI:            0.244 m AV Peak Grad:      6.0 mmHg AV Mean Grad:      3.5 mmHg LVOT Vmax:         85.70 cm/s LVOT Vmean:        55.700 cm/s LVOT VTI:          0.178 m LVOT/AV VTI ratio: 0.73  AORTA Ao Root diam: 2.60 cm MITRAL VALVE MV Area (PHT): 3.37 cm     SHUNTS MV Decel Time: 225 msec     Systemic VTI:  0.18 m MV E velocity: 108.00 cm/s  Systemic Diam: 2.00 cm MV A velocity: 62.10 cm/s MV E/A ratio:  1.74 Serafina Royals MD Electronically signed by Serafina Royals MD Signature Date/Time: 12/09/2019/1:47:20 PM    Final    [place] CORE BIOPSY (SOFT TISSUE)  Result Date: 10/28/2019 CLINICAL DATA:  History of colon carcinoma and status post transverse colectomy and prior treatment with chemotherapy and radiation therapy. Recent imaging has demonstrated new omental, peritoneal and abdominal wall soft tissue nodules which are hypermetabolic  on PET scan and suspicious for recurrent disease. EXAM: ULTRASOUND GUIDED CORE BIOPSY OF ABDOMINAL WALL MASS MEDICATIONS: 1.0 mg IV Versed; 50 mcg IV Fentanyl Total Moderate Sedation Time: 12 minutes. The patient's level of consciousness and physiologic status were continuously monitored during the procedure by Radiology nursing. PROCEDURE: The procedure, risks, benefits, and alternatives were explained to the patient. Questions regarding the procedure were encouraged and answered. The patient understands and consents to the procedure. A time out was performed prior to initiating the procedure. Ultrasound was performed of the abdominal wall and anterior peritoneal cavity. The anterior abdominal wall was prepped with chlorhexidine in a sterile fashion, and a sterile drape was applied covering the operative field. A sterile gown and sterile gloves were used for the procedure. Local anesthesia was provided with 1% Lidocaine. Core biopsy of an anterior abdominal wall mass was performed with an 18 gauge core biopsy device. Four separate core biopsy samples were obtained and submitted in formalin. COMPLICATIONS: None. FINDINGS: Irregular lobulated hypoechoic soft tissue mass is seen involving the right lower abdominal wall and corresponding to one of the lesions that demonstrated increased metabolic activity by PET scan. This area measures approximately 3.2 x 2.1 x 2.6 cm by ultrasound. Solid tissue was obtained with biopsy. IMPRESSION: Ultrasound-guided core biopsy performed of a right lower abdominal wall mass measuring 3 cm in estimated maximal diameter. Electronically Signed   By: Aletta Edouard M.D.   On: 10/28/2019 12:02      ASSESSMENT/PLAN 74yo female who has recurrent Colon Cancer presents for follow up  Cancer Staging Malignant neoplasm of transverse colon [employer]) Staging form: Colon and Rectum,  AJCC 8th Edition - Clinical stage from 04/15/2017: Stage IIIB (cT4a, cN1b, cM0) - Signed by Earlie Server, MD on  04/26/2017  1. Malignant neoplasm of transverse colon ([place])   2. Encounter for antineoplastic chemotherapy   3. Neoplasm related pain   4. Parotid nodule   5. Iron deficiency anemia due to chronic blood loss    #Recurrent colon cancer with abdominal wall and mesentery nodule.    BRAF V600 E mutation and ATM mutation. Labs are reviewed and discussed with patient. Patient had anaphylactic reaction to cetuximab. Labs are reviewed and discussed with patient. Proceed with cycle 1 panitumumab.  Advise patient to also start Encorafenib 392m daily.   Abdominal pain after heavy lifting.  Advised patient not to lift anything more than 5 pounds. She takes hydrocodone-acetaminophen 5/325, prescribed by PCP recently.  Iron deficiency anemia,hemoglobin improved.    # History of breast cancer, need mammogram in March 2021 # Parotid nodule with hypermetabolism.  Her treatment for colon cancer takes priority.  Once colon cancer is stabilized, will refer her to ENT.   # Leukocytosis, intermittent monocytosis, she declined bone marrow biopsy on 05/22/2018.  Continue to monitor. Treatments of colon cancer takes priority at this point.   Follow up in 1 week.   ZEarlie Server MD, PhD Hematology Oncology CNovant Health Rehabilitation Hospitalat AMotion Picture And Television HospitalPager- [phone]/09/21

## 2020-01-10 ENCOUNTER — Other Ambulatory Visit: Payer: Self-pay

## 2020-01-10 ENCOUNTER — Telehealth: Payer: Self-pay | Admitting: *Deleted

## 2020-01-10 ENCOUNTER — Emergency Department: Payer: Medicare HMO

## 2020-01-10 ENCOUNTER — Encounter: Payer: Self-pay | Admitting: Emergency Medicine

## 2020-01-10 ENCOUNTER — Inpatient Hospital Stay: Payer: Medicare HMO | Admitting: Family Medicine

## 2020-01-10 ENCOUNTER — Observation Stay
Admission: EM | Admit: 2020-01-10 | Discharge: 2020-01-11 | Disposition: A | Discharge status: Home / Self Care | Payer: Medicare HMO | Attending: Internal Medicine | Admitting: Internal Medicine

## 2020-01-10 DIAGNOSIS — Z85038 Personal history of other malignant neoplasm of large intestine: Secondary | ICD-10-CM | POA: Insufficient documentation

## 2020-01-10 DIAGNOSIS — Z853 Personal history of malignant neoplasm of breast: Secondary | ICD-10-CM | POA: Diagnosis not present

## 2020-01-10 DIAGNOSIS — Z888 Allergy status to other drugs, medicaments and biological substances status: Secondary | ICD-10-CM | POA: Insufficient documentation

## 2020-01-10 DIAGNOSIS — Z9981 Dependence on supplemental oxygen: Secondary | ICD-10-CM | POA: Diagnosis not present

## 2020-01-10 DIAGNOSIS — R262 Difficulty in walking, not elsewhere classified: Secondary | ICD-10-CM | POA: Insufficient documentation

## 2020-01-10 DIAGNOSIS — I4891 Unspecified atrial fibrillation: Secondary | ICD-10-CM | POA: Diagnosis not present

## 2020-01-10 DIAGNOSIS — Z7901 Long term (current) use of anticoagulants: Secondary | ICD-10-CM | POA: Diagnosis not present

## 2020-01-10 DIAGNOSIS — Z7952 Long term (current) use of systemic steroids: Secondary | ICD-10-CM | POA: Diagnosis not present

## 2020-01-10 DIAGNOSIS — C189 Malignant neoplasm of colon, unspecified: Secondary | ICD-10-CM | POA: Diagnosis not present

## 2020-01-10 DIAGNOSIS — Z87891 Personal history of nicotine dependence: Secondary | ICD-10-CM | POA: Insufficient documentation

## 2020-01-10 DIAGNOSIS — C801 Malignant (primary) neoplasm, unspecified: Secondary | ICD-10-CM | POA: Diagnosis not present

## 2020-01-10 DIAGNOSIS — I480 Paroxysmal atrial fibrillation: Secondary | ICD-10-CM | POA: Diagnosis not present

## 2020-01-10 DIAGNOSIS — R0602 Shortness of breath: Secondary | ICD-10-CM | POA: Diagnosis not present

## 2020-01-10 DIAGNOSIS — I10 Essential (primary) hypertension: Secondary | ICD-10-CM | POA: Diagnosis not present

## 2020-01-10 DIAGNOSIS — Z7951 Long term (current) use of inhaled steroids: Secondary | ICD-10-CM | POA: Diagnosis not present

## 2020-01-10 DIAGNOSIS — K219 Gastro-esophageal reflux disease without esophagitis: Secondary | ICD-10-CM | POA: Diagnosis not present

## 2020-01-10 DIAGNOSIS — Z79899 Other long term (current) drug therapy: Secondary | ICD-10-CM | POA: Insufficient documentation

## 2020-01-10 DIAGNOSIS — J449 Chronic obstructive pulmonary disease, unspecified: Secondary | ICD-10-CM | POA: Diagnosis not present

## 2020-01-10 DIAGNOSIS — R0789 Other chest pain: Secondary | ICD-10-CM | POA: Diagnosis not present

## 2020-01-10 DIAGNOSIS — Z20822 Contact with and (suspected) exposure to covid-19: Secondary | ICD-10-CM | POA: Diagnosis not present

## 2020-01-10 DIAGNOSIS — C799 Secondary malignant neoplasm of unspecified site: Secondary | ICD-10-CM | POA: Diagnosis not present

## 2020-01-10 DIAGNOSIS — R079 Chest pain, unspecified: Secondary | ICD-10-CM | POA: Diagnosis not present

## 2020-01-10 LAB — CBC WITH DIFFERENTIAL/PLATELET
Abs Immature Granulocytes: 0.06 10*3/uL (ref 0.00–0.07)
Basophils Absolute: 0 10*3/uL (ref 0.0–0.1)
Basophils Relative: 0 %
Eosinophils Absolute: 0.1 10*3/uL (ref 0.0–0.5)
Eosinophils Relative: 1 %
HCT: 43 % (ref 36.0–46.0)
Hemoglobin: 13.5 g/dL (ref 12.0–15.0)
Immature Granulocytes: 0 %
Lymphocytes Relative: 10 %
Lymphs Abs: 1.5 10*3/uL (ref 0.7–4.0)
MCH: 27.4 pg (ref 26.0–34.0)
MCHC: 31.4 g/dL (ref 30.0–36.0)
MCV: 87.4 fL (ref 80.0–100.0)
Monocytes Absolute: 1.7 10*3/uL — ABNORMAL HIGH (ref 0.1–1.0)
Monocytes Relative: 11 %
Neutro Abs: 11.8 10*3/uL — ABNORMAL HIGH (ref 1.7–7.7)
Neutrophils Relative %: 78 %
Platelets: 291 10*3/uL (ref 150–400)
RBC: 4.92 MIL/uL (ref 3.87–5.11)
RDW: 21 % — ABNORMAL HIGH (ref 11.5–15.5)
WBC: 15.1 10*3/uL — ABNORMAL HIGH (ref 4.0–10.5)
nRBC: 0 % (ref 0.0–0.2)

## 2020-01-10 LAB — COMPREHENSIVE METABOLIC PANEL
ALT: 17 U/L (ref 0–44)
AST: 16 U/L (ref 15–41)
Albumin: 3.3 g/dL — ABNORMAL LOW (ref 3.5–5.0)
Alkaline Phosphatase: 65 U/L (ref 38–126)
Anion gap: 11 (ref 5–15)
BUN: 36 mg/dL — ABNORMAL HIGH (ref 8–23)
CO2: 21 mmol/L — ABNORMAL LOW (ref 22–32)
Calcium: 8.2 mg/dL — ABNORMAL LOW (ref 8.9–10.3)
Chloride: 103 mmol/L (ref 98–111)
Creatinine, Ser: 0.94 mg/dL (ref 0.44–1.00)
GFR calc Af Amer: >60 mL/min (ref >60)
GFR calc non Af Amer: >60 mL/min (ref >60)
Glucose, Bld: 169 mg/dL — ABNORMAL HIGH (ref 70–99)
Potassium: 4.5 mmol/L (ref 3.5–5.1)
Sodium: 135 mmol/L (ref 135–145)
Total Bilirubin: 1.1 mg/dL (ref 0.3–1.2)
Total Protein: 6.6 g/dL (ref 6.5–8.1)

## 2020-01-10 LAB — RESPIRATORY PANEL BY RT PCR (FLU A&B, COVID)
Influenza A by PCR: NEGATIVE
Influenza B by PCR: NEGATIVE
SARS Coronavirus 2 by RT PCR: NEGATIVE

## 2020-01-10 LAB — BRAIN NATRIURETIC PEPTIDE: B Natriuretic Peptide: 1158 pg/mL — ABNORMAL HIGH (ref 0.0–100.0)

## 2020-01-10 LAB — MAGNESIUM: Magnesium: 2 mg/dL (ref 1.7–2.4)

## 2020-01-10 LAB — TSH: TSH: 2.928 u[IU]/mL (ref 0.350–4.500)

## 2020-01-10 LAB — TROPONIN I (HIGH SENSITIVITY)
Troponin I (High Sensitivity): 24 ng/L — ABNORMAL HIGH (ref <18)
Troponin I (High Sensitivity): 23 ng/L — ABNORMAL HIGH (ref <18)

## 2020-01-10 MED ORDER — METOPROLOL TARTRATE 25 MG PO TABS: 25.0000 mg | ORAL_TABLET | Freq: Two times a day (BID) | ORAL | Status: DC

## 2020-01-10 MED ORDER — DIPHENHYDRAMINE HCL 12.5 MG/5ML PO ELIX
ORAL_SOLUTION | ORAL | Status: AC
  Filled 2020-01-10: qty 5

## 2020-01-10 MED ORDER — SODIUM CHLORIDE 0.9 % IV BOLUS
500.0000 mL | Freq: Once | INTRAVENOUS | Status: AC
  Administered 2020-01-10: 500 mL via INTRAVENOUS

## 2020-01-10 MED ORDER — METOPROLOL TARTRATE 25 MG PO TABS
25.0000 mg | ORAL_TABLET | Freq: Three times a day (TID) | ORAL | Status: DC
  Administered 2020-01-10: 25 mg via ORAL
  Filled 2020-01-10: qty 1

## 2020-01-10 MED ORDER — PRAVASTATIN SODIUM 40 MG PO TABS
40.0000 mg | ORAL_TABLET | Freq: Every day | ORAL | Status: DC
  Administered 2020-01-11: 40 mg via ORAL
  Filled 2020-01-10: qty 1

## 2020-01-10 MED ORDER — METOPROLOL TARTRATE 5 MG/5ML IV SOLN
2.5000 mg | Freq: Once | INTRAVENOUS | Status: AC
  Administered 2020-01-10: 2.5 mg via INTRAVENOUS

## 2020-01-10 MED ORDER — ONDANSETRON HCL 4 MG PO TABS: 4.0000 mg | ORAL_TABLET | Freq: Four times a day (QID) | ORAL | Status: DC | PRN

## 2020-01-10 MED ORDER — ACETAMINOPHEN 325 MG PO TABS: 650.0000 mg | ORAL_TABLET | Freq: Four times a day (QID) | ORAL | Status: DC | PRN

## 2020-01-10 MED ORDER — GABAPENTIN 400 MG PO CAPS
400.0000 mg | ORAL_CAPSULE | Freq: Two times a day (BID) | ORAL | Status: DC
  Administered 2020-01-10 – 2020-01-11 (×2): 400 mg via ORAL
  Filled 2020-01-10 (×2): qty 1

## 2020-01-10 MED ORDER — ALBUTEROL SULFATE (2.5 MG/3ML) 0.083% IN NEBU: 2.5000 mg | INHALATION_SOLUTION | RESPIRATORY_TRACT | Status: DC | PRN

## 2020-01-10 MED ORDER — POLYETHYLENE GLYCOL 3350 17 G PO PACK: 17.0000 g | PACK | Freq: Every day | ORAL | Status: DC | PRN

## 2020-01-10 MED ORDER — RISAQUAD PO CAPS
1.0000 | ORAL_CAPSULE | Freq: Every day | ORAL | Status: DC
  Administered 2020-01-11: 1 via ORAL
  Filled 2020-01-10: qty 1

## 2020-01-10 MED ORDER — SENNA 8.6 MG PO TABS
1.0000 | ORAL_TABLET | Freq: Two times a day (BID) | ORAL | Status: DC
  Administered 2020-01-10: 8.6 mg via ORAL
  Filled 2020-01-10 (×2): qty 1

## 2020-01-10 MED ORDER — ADULT MULTIVITAMIN W/MINERALS CH
1.0000 | ORAL_TABLET | Freq: Every day | ORAL | Status: DC
  Administered 2020-01-11: 1 via ORAL
  Filled 2020-01-10: qty 1

## 2020-01-10 MED ORDER — ACETAMINOPHEN 650 MG RE SUPP: 650.0000 mg | Freq: Four times a day (QID) | RECTAL | Status: DC | PRN

## 2020-01-10 MED ORDER — MOMETASONE FURO-FORMOTEROL FUM 100-5 MCG/ACT IN AERO
2.0000 | INHALATION_SPRAY | Freq: Two times a day (BID) | RESPIRATORY_TRACT | Status: DC
  Administered 2020-01-10 – 2020-01-11 (×2): 2 via RESPIRATORY_TRACT
  Filled 2020-01-10: qty 8.8

## 2020-01-10 MED ORDER — METOPROLOL TARTRATE 5 MG/5ML IV SOLN
INTRAVENOUS | Status: AC
  Filled 2020-01-10: qty 5

## 2020-01-10 MED ORDER — DILTIAZEM HCL 25 MG/5ML IV SOLN
10.0000 mg | Freq: Once | INTRAVENOUS | Status: AC
  Administered 2020-01-10: 10 mg via INTRAVENOUS
  Filled 2020-01-10: qty 5

## 2020-01-10 MED ORDER — METOPROLOL TARTRATE 5 MG/5ML IV SOLN
5.0000 mg | Freq: Once | INTRAVENOUS | Status: AC
  Administered 2020-01-10: 5 mg via INTRAVENOUS
  Filled 2020-01-10: qty 5

## 2020-01-10 MED ORDER — VITAMIN D 25 MCG (1000 UNIT) PO TABS
2000.0000 [IU] | ORAL_TABLET | Freq: Every day | ORAL | Status: DC
  Administered 2020-01-11: 2000 [IU] via ORAL
  Filled 2020-01-10: qty 2

## 2020-01-10 MED ORDER — MELOXICAM 7.5 MG PO TABS
15.0000 mg | ORAL_TABLET | Freq: Every day | ORAL | Status: DC
  Administered 2020-01-10: 15 mg via ORAL
  Filled 2020-01-10 (×2): qty 2

## 2020-01-10 MED ORDER — CALCIUM CITRATE-VITAMIN D 500-500 MG-UNIT PO CHEW
1.0000 | CHEWABLE_TABLET | Freq: Every day | ORAL | Status: DC
  Administered 2020-01-11: 1 via ORAL
  Filled 2020-01-10: qty 1

## 2020-01-10 MED ORDER — ONDANSETRON HCL 4 MG/2ML IJ SOLN: 4.0000 mg | Freq: Four times a day (QID) | INTRAMUSCULAR | Status: DC | PRN

## 2020-01-10 MED ORDER — METOPROLOL TARTRATE 5 MG/5ML IV SOLN
5.0000 mg | INTRAVENOUS | Status: DC | PRN
  Administered 2020-01-10: 5 mg via INTRAVENOUS
  Filled 2020-01-10: qty 5

## 2020-01-10 MED ORDER — APIXABAN 5 MG PO TABS
5.0000 mg | ORAL_TABLET | Freq: Two times a day (BID) | ORAL | Status: DC
  Administered 2020-01-10 – 2020-01-11 (×2): 5 mg via ORAL
  Filled 2020-01-10 (×2): qty 1

## 2020-01-10 MED ORDER — PANTOPRAZOLE SODIUM 40 MG PO TBEC
40.0000 mg | DELAYED_RELEASE_TABLET | Freq: Every day | ORAL | Status: DC
  Administered 2020-01-11: 40 mg via ORAL
  Filled 2020-01-10: qty 1

## 2020-01-10 MED ORDER — DIPHENHYDRAMINE HCL 50 MG/ML IJ SOLN
12.5000 mg | Freq: Once | INTRAMUSCULAR | Status: AC
  Administered 2020-01-10: 12.5 mg via INTRAVENOUS
  Filled 2020-01-10: qty 1

## 2020-01-10 NOTE — Telephone Encounter (Signed)
I called patient. She reports having severe chest pressure, and pain, severe enough that she has to take a pain medication.  Feels weak and cannot walk.  I strongly advised patient to go to emergency room for evaluation and assessment.  She agrees to go to ER.   Patient says " I am not taking the medication any more, you figure out something else."  I discussed with patient that first step is to rule out other etiologies and we can further discuss about dosage of BRAFtovi.

## 2020-01-10 NOTE — ED Provider Notes (Signed)
[facility] Emergency Department Provider Note  ____________________________________________   First MD Initiated Contact with Patient 01/10/20 1320     (approximate)  I have reviewed the triage vital signs and the nursing notes.   HISTORY  Chief Complaint No chief complaint on file.    HPI Kimberly [name] is a 74 y.o. female  With h/o COPD, GERD, AFib, metastatic colon CA here with jaw pain, chest pressure, SOB, weakness. Pt recently started a new immunologic treatment for her colon CA on Friday, receiving an infusion then starting oral Braltovi on Saturday. Starting Sat Night, she has developed progressively worsening fatigue, palpitations, and weakness. She has felt extremely SOB with exertion. She began to feel some intermittent lower jaw and neck pressure as well. She attributed most of this to the medication. She has also felt dull chest pressure and weakness, which is all new. She denies any cough. She called her MD today who sent her to the ED for evaluation. Of note, pt has h/o recent new onset AFib in March, requiring admission. No known fever, chills. No other new medicaitons.       Past Medical History:  Diagnosis Date  . Breast cancer, left ([place]) 2004   Lumpectomy and rad tx's.  . Chronic back pain   . Colon cancer ([place]) 2018  . COPD (chronic obstructive pulmonary disease) ([place])   . Degenerative disc disease, lumbar 04/2013  . Family history of adverse reaction to anesthesia    son arrested after anesthesia  . GERD (gastroesophageal reflux disease)   . Iron deficiency anemia 05/27/2017  . Personal history of chemotherapy 2018   Colon  . Personal history of radiation therapy    Breast 2004  . Personal history of radiation therapy 2018   Colon  . Postprocedural intraabdominal abscess 09/01/2018  . Small bowel obstruction ([place]) 04/16/2017  . Wears dentures    full upper    Patient Active Problem List   Diagnosis Date Noted  . Encounter  for antineoplastic chemotherapy 12/24/2019  . Infusion reaction 12/24/2019  . Anaphylaxis 12/24/2019  . Shortness of breath   . Atrial fibrillation with RVR ([place]) 12/08/2019  . Leukocytosis 12/08/2019  . Thrombocytosis ([place]) 12/08/2019  . Iron deficiency anemia due to chronic blood loss 12/08/2019  . Nocturnal hypoxemia 12/08/2019  . Goals of care, counseling/discussion 11/30/2019  . Personal history of colon cancer   . Intestinal bypass or anastomosis status   . Colonic edema   . COPD exacerbation ([place])   . Dependence on nocturnal oxygen therapy 08/12/2018  . Clostridium difficile colitis 07/31/2018  . S/P colostomy takedown 06/25/2018  . Parastomal hernia without obstruction or gangrene   . Status post colon resection   . Colostomy status ([place])   . Colostomy prolapse ([place])   . Iron deficiency anemia 05/27/2017  . Port-A-Cath in place   . Left peroneal vein thrombosis ([place])   . Malignant neoplasm of transverse colon ([place]) 04/26/2017  . Generalized abdominal pain   . Loss of weight   . Gastritis without bleeding   . Absence of bladder continence 04/01/2017  . Asthmatic breathing 04/01/2017  . OP (osteoporosis) 02/11/2017  . Skin lesion 02/11/2017  . Breast swelling 02/11/2017  . Narrowing of intervertebral disc space 02/11/2017  . Family history of colon cancer 02/11/2017  . Foot pain 02/11/2017  . Arthralgia of hip 02/11/2017  . Personal history of malignant neoplasm of breast 02/11/2017  . Eczema intertrigo 02/11/2017  . Neuropathy 02/11/2017  .  Osteoarthrosis, unspecified whether generalized or localized, involving lower leg 02/11/2017  . Coronary atherosclerosis 01/29/2017  . Smoking greater than 30 pack years 01/10/2017  . Vitamin D deficiency 08/21/2016  . Hyperlipidemia 11/10/2015  . Chronic back pain 05/26/2015  . DDD (degenerative disc disease), lumbosacral 05/26/2015  . History of colon polyps 04/27/2007    Past Surgical History:  Procedure Laterality Date   . APPENDECTOMY  1965  . BREAST EXCISIONAL BIOPSY Left 08/01/2003   lumpectomy rad 11/04-2/28/2005  . BREAST SURGERY    . CESAREAN SECTION     x3  . COLON SURGERY    . COLONOSCOPY W/ POLYPECTOMY    . COLONOSCOPY WITH PROPOFOL N/A 04/09/2018   Procedure: COLONOSCOPY WITH PROPOFOL;  Surgeon: Virgel Manifold, MD;  Location: [facility] END[facility];  Service: Gastroenterology;  Laterality: N/A;  . COLONOSCOPY WITH PROPOFOL N/A 08/13/2019   Procedure: COLONOSCOPY WITH PROPOFOL;  Surgeon: Virgel Manifold, MD;  Location: [place];  Service: Endoscopy;  Laterality: N/A;  . COLOSTOMY TAKEDOWN N/A 06/25/2018   Procedure: COLOSTOMY TAKEDOWN;  Surgeon: Jules Husbands, MD;  Location: [facility];  Service: General;  Laterality: N/A;  . ESOPHAGOGASTRODUODENOSCOPY (EGD) WITH PROPOFOL N/A 04/04/2017   Procedure: ESOPHAGOGASTRODUODENOSCOPY (EGD) WITH PROPOFOL;  Surgeon: [name], MD;  Location: [place];  Service: Endoscopy;  Laterality: N/A;  . FRACTURE SURGERY    . HIP FRACTURE SURGERY Left 01/25/2012  . LAPAROSCOPY N/A 12/07/2019   Procedure: LAPAROSCOPY DIAGNOSTIC;  Surgeon: Jules Husbands, MD;  Location: [facility];  Service: General;  Laterality: N/A;  . LAPAROTOMY N/A 04/15/2017   Procedure: EXPLORATORY LAPAROTOMY;  Surgeon: Clayburn Pert, MD;  Location: [facility];  Service: General;  Laterality: N/A;  . NECK SURGERY  12/2011  . PARASTOMAL HERNIA REPAIR N/A 12/03/2017   Procedure: HERNIA REPAIR PARASTOMAL;  Surgeon: Jules Husbands, MD;  Location: [facility];  Service: General;  Laterality: N/A;  . PARASTOMAL HERNIA REPAIR N/A 06/25/2018   Procedure: HERNIA REPAIR PARASTOMAL;  Surgeon: Jules Husbands, MD;  Location: [facility];  Service: General;  Laterality: N/A;  . PORTACATH PLACEMENT Right 05/21/2017   Procedure: INSERTION PORT-A-CATH;  Surgeon: Clayburn Pert, MD;  Location: [facility];  Service: General;  Laterality: Right;  . PORTACATH PLACEMENT Right 12/07/2019   Procedure: INSERTION  PORT-A-CATH;  Surgeon: Jules Husbands, MD;  Location: [facility];  Service: General;  Laterality: Right;  . WRIST FRACTURE SURGERY      Prior to Admission medications   Medication Sig Start Date End Date Taking? Authorizing Provider  apixaban (ELIQUIS) 5 MG TABS tablet Take 1 tablet (5 mg total) by mouth 2 (two) times daily. 12/10/19 01/09/20  Sidney Ace, MD  budesonide-formoterol (SYMBICORT) 80-4.5 MCG/ACT inhaler Inhale 2 puffs into the lungs 2 (two) times daily. 12/25/19   Lavina Hamman, MD  calcium citrate-vitamin D (CITRACAL+D) 315-200 MG-UNIT tablet Take 1 tablet by mouth daily.    [provider]  Cholecalciferol (VITAMIN D3) 50 MCG (2000 UT) TABS Take 2,000 Units by mouth daily. 11/05/19   Birdie Sons, MD  dexamethasone (DECADRON) 4 MG tablet Take 2 tablets (8 mg total) by mouth daily. Take 2 tablets (8 mg) the day before treatment. Then, 2 tabs (8mg ) daily for 2 days after treatment. 12/31/19   Earlie Server, MD  diphenhydrAMINE (BENADRYL) 25 MG tablet Take 2 tablets (50 mg total) by mouth at bedtime as needed for itching or allergies. 12/25/19   Lavina Hamman, MD  encorafenib (BRAFTOVI) 75 MG  capsule Take 4 capsules (300 mg total) by mouth daily. Patient not taking: Reported on 01/06/2020 12/29/19   Earlie Server, MD  EPINEPHrine 0.3 mg/0.3 mL IJ SOAJ injection Inject 0.3 mLs (0.3 mg total) into the muscle as needed for anaphylaxis. 12/25/19   Lavina Hamman, MD  gabapentin (NEURONTIN) 400 MG capsule TAKE 1 CAPSULE THREE TIMES DAILY Patient taking differently: Take 600 mg by mouth 3 (three) times daily.  11/08/19   Earlie Server, MD  HYDROcodone-acetaminophen (NORCO/VICODIN) 5-325 MG tablet Take 1-2 tablets by mouth every 6 (six) hours as needed for moderate pain. 01/03/20   Birdie Sons, MD  meloxicam (MOBIC) 15 MG tablet TAKE 1 TABLET EVERY DAY AS NEEDED Patient taking differently: Take 15 mg by mouth at bedtime.  05/20/19   Birdie Sons, MD  metoprolol tartrate (LOPRESSOR) 25 MG  tablet Take 1 tablet (25 mg total) by mouth 2 (two) times daily. 12/10/19 01/09/20  Sidney Ace, MD  Multiple Vitamins-Minerals (WOMENS MULTI VITAMIN & MINERAL PO) Take 2 tablets by mouth daily.     [provider]  omeprazole (PRILOSEC) 20 MG capsule Take 1 capsule (20 mg total) by mouth daily. 03/10/19   Birdie Sons, MD  pravastatin (PRAVACHOL) 40 MG tablet TAKE 1 TABLET EVERY DAY Patient taking differently: Take 40 mg by mouth daily.  05/20/19   Birdie Sons, MD  predniSONE (DELTASONE) 10 MG tablet Take 40mg  daily for 3days,Take 30mg  daily for 3days,Take 20mg  daily for 3days,Take 10mg  daily for 3days, then stop Patient not taking: Reported on 01/06/2020 12/25/19   Lavina Hamman, MD  Probiotic Product (PROBIOTIC DAILY PO) Take 1 capsule by mouth daily.     [provider]  senna-docusate (SENOKOT-S) 8.6-50 MG tablet Take 2 tablets by mouth daily. 12/24/19   Earlie Server, MD    Allergies Erbitux [cetuximab], Betadine [povidone iodine], Iodine, Other, and Sinus formula [cholestatin]  Family History  Problem Relation Age of Onset  . Diabetes Mother        type 2  . Coronary artery disease Mother   . Deep vein thrombosis Mother   . Colon cancer Father   . Asthma Brother   . Diabetes Brother        type 2  . Breast cancer Neg Hx     Social History Social History   Tobacco Use  . Smoking status: Former Smoker    Packs/day: 0.25    Years: 55.00    Pack years: 13.75    Types: Cigarettes    Quit date: 05/21/2017    Years since quitting: 2.6  . Smokeless tobacco: Never Used  . Tobacco comment: started age 11 1/2 to 1 ppd  Substance Use Topics  . Alcohol use: Yes    Alcohol/week: 0.0 standard drinks    Comment: occasionally drinks beer  . Drug use: No    Review of Systems  Review of Systems  Constitutional: Positive for fatigue. Negative for fever.  HENT: Negative for congestion and sore throat.   Eyes: Negative for visual disturbance.  Respiratory:  Positive for shortness of breath. Negative for cough.   Cardiovascular: Positive for chest pain and palpitations.  Gastrointestinal: Negative for abdominal pain, diarrhea, nausea and vomiting.  Genitourinary: Negative for flank pain.  Musculoskeletal: Negative for back pain and neck pain.  Skin: Negative for rash and wound.  Neurological: Positive for weakness and light-headedness.  All other systems reviewed and are negative.    ____________________________________________  PHYSICAL EXAM:  VITAL SIGNS: ED Triage Vitals  Enc Vitals Group     BP 01/10/20 1317 99/65     Pulse Rate 01/10/20 1308 (!) 156     Resp 01/10/20 1317 17     Temp 01/10/20 1317 97.7 F (36.5 C)     Temp Source 01/10/20 1317 Oral     SpO2 01/10/20 1317 93 %     Weight 01/10/20 1308 159 lb 13.3 oz (72.5 kg)     Height 01/10/20 1308 5\' 4"  (1.626 m)     Head Circumference --      Peak Flow --      Pain Score 01/10/20 1308 0     Pain Loc --      Pain Edu? --      Excl. in [place]? --      Physical Exam Vitals and nursing note reviewed.  Constitutional:      General: She is not in acute distress.    Appearance: She is well-developed.  HENT:     Head: Normocephalic and atraumatic.     Mouth/Throat:     Mouth: Mucous membranes are dry.  Eyes:     Conjunctiva/sclera: Conjunctivae normal.  Cardiovascular:     Rate and Rhythm: Tachycardia present. Rhythm irregular.     Heart sounds: Normal heart sounds. No murmur. No friction rub.  Pulmonary:     Effort: Pulmonary effort is normal. No respiratory distress.     Breath sounds: Rales present. No wheezing.  Abdominal:     General: There is no distension.     Palpations: Abdomen is soft.     Tenderness: There is no abdominal tenderness.  Musculoskeletal:     Cervical back: Neck supple.  Skin:    General: Skin is warm.     Capillary Refill: Capillary refill takes less than 2 seconds.     Findings: No rash.  Neurological:     Mental Status: She is  alert and oriented to person, place, and time.     Motor: No abnormal muscle tone.       ____________________________________________   LABS (all labs ordered are listed, but only abnormal results are displayed)  Labs Reviewed  CBC WITH DIFFERENTIAL/PLATELET - Abnormal; Notable for the following components:      Result Value   WBC 15.1 (*)    RDW 21.0 (*)    Neutro Abs 11.8 (*)    Monocytes Absolute 1.7 (*)    All other components within normal limits  COMPREHENSIVE METABOLIC PANEL - Abnormal; Notable for the following components:   CO2 21 (*)    Glucose, Bld 169 (*)    BUN 36 (*)    Calcium 8.2 (*)    Albumin 3.3 (*)    All other components within normal limits  BRAIN NATRIURETIC PEPTIDE - Abnormal; Notable for the following components:   B Natriuretic Peptide 1,158.0 (*)    All other components within normal limits  TROPONIN I (HIGH SENSITIVITY) - Abnormal; Notable for the following components:   Troponin I (High Sensitivity) 24 (*)    All other components within normal limits  MAGNESIUM  TSH  TROPONIN I (HIGH SENSITIVITY)    ____________________________________________  EKG: Suspected atrial flutter with VR 156. TWI noted diffusely likely rate-related. QRS 66, QTc 364.  ________________________________________  RADIOLOGY All imaging, including plain films, CT scans, and ultrasounds, independently reviewed by me, and interpretations confirmed via formal radiology reads.  ED MD interpretation:   CXR: Minimal bibasilar atelectasis  Official radiology report(s):  DG Chest Portable 1 View  Result Date: 01/10/2020 CLINICAL DATA:  Shortness of breath, chest pain, symptoms since Saturday, history colon cancer, melanoma EXAM: PORTABLE CHEST 1 VIEW COMPARISON:  Portable exam 1347 hours compared to 12/24/2019 FINDINGS: RIGHT jugular Port-A-Cath with tip projecting over SVC. Upper normal heart size. Mediastinal contours and pulmonary vascularity normal. Atherosclerotic  calcification aorta. Minimal bibasilar atelectasis. Lungs otherwise clear. No infiltrate, pleural effusion or pneumothorax. Bones demineralized. IMPRESSION: Minimal bibasilar atelectasis. Electronically Signed   By: Lavonia Dana M.D.   On: 01/10/2020 13:54    ____________________________________________  PROCEDURES   Procedure(s) performed (including Critical Care):  .Critical Care Performed by: Duffy Bruce, MD Authorized by: Duffy Bruce, MD   Critical care provider statement:    Critical care time (minutes):  35   Critical care time was exclusive of:  Separately billable procedures and treating other patients and teaching time   Critical care was necessary to treat or prevent imminent or life-threatening deterioration of the following conditions:  Cardiac failure, circulatory failure and respiratory failure   Critical care was time spent personally by me on the following activities:  Development of treatment plan with patient or surrogate, discussions with consultants, evaluation of patient's response to treatment, examination of patient, obtaining history from patient or surrogate, ordering and performing treatments and interventions, ordering and review of laboratory studies, ordering and review of radiographic studies, pulse oximetry, re-evaluation of patient's condition and review of old charts   I assumed direction of critical care for this patient from another provider in my specialty: no   .1-3 Lead EKG Interpretation Performed by: Duffy Bruce, MD Authorized by: Duffy Bruce, MD     Interpretation: abnormal     ECG rate:  100-140s   ECG rate assessment: tachycardic     Rhythm: atrial fibrillation     Ectopy: none     Conduction: normal   Comments:     Indication: Afib RVR    ____________________________________________  INITIAL IMPRESSION / MDM / ASSESSMENT AND PLAN / ED COURSE  As part of my medical decision making, I reviewed the following data within  the [facility] notes reviewed and incorporated, Old chart reviewed, Notes from prior ED visits, and [place] Controlled Substance Database       *Kimberly [name] was evaluated in Emergency Department on 01/10/2020 for the symptoms described in the history of present illness. She was evaluated in the context of the global COVID-19 pandemic, which necessitated consideration that the patient might be at risk for infection with the SARS-CoV-2 virus that causes COVID-19. Institutional protocols and algorithms that pertain to the evaluation of patients at risk for COVID-19 are in a state of rapid change based on information released by regulatory bodies including the CDC and federal and state organizations. These policies and algorithms were followed during the patient's care in the ED.  Some ED evaluations and interventions may be delayed as a result of limited staffing during the pandemic.*     Medical Decision Making:  74 yo F here with SOB, chest pressure, weakness in setting of recently starting Braftovi. Clinically, I suspect this is more so angina and weakness 2/2 AFib RVR precipitated by steroids and dehydration (poor appetite), rather than an allergy or adverse reaction. Pt was initially given diltiazem but had reported itching, so will switch to lopressor IV. Otherwise, labs, electrolytes reviewed above. Minimal trop elevation is likely demand related. BUN/Cr ratio elevated and intravascularly she appears dry, though BNP elevated. Will be  cautious with fluids. Will admit for ongoing AFib management.   ____________________________________________  FINAL CLINICAL IMPRESSION(S) / ED DIAGNOSES  Final diagnoses:  Atrial fibrillation with rapid ventricular response (HCC)  Metastatic malignant neoplasm, unspecified site ([place])     MEDICATIONS GIVEN DURING THIS VISIT:  Medications  diphenhydrAMINE (BENADRYL) 12.5 MG/5ML elixir (has no administration in time range)  metoprolol  tartrate (LOPRESSOR) injection 5 mg (has no administration in time range)  sodium chloride 0.9 % bolus 500 mL (0 mLs Intravenous Stopped 01/10/20 1441)  diltiazem (CARDIZEM) injection 10 mg (10 mg Intravenous Given 01/10/20 1329)  diphenhydrAMINE (BENADRYL) injection 12.5 mg (12.5 mg Intravenous Given 01/10/20 1336)  metoprolol tartrate (LOPRESSOR) injection 2.5 mg (2.5 mg Intravenous Given 01/10/20 1438)     ED Discharge Orders    None       Note:  This document was prepared using Dragon voice recognition software and may include unintentional dictation errors.   Duffy Bruce, MD 01/10/20 1540

## 2020-01-10 NOTE — ED Triage Notes (Signed)
C/O intermittent chest pain, SOB since Saturday, after starting Braftovi for colon cancer and melanoma to abdominal wall.

## 2020-01-10 NOTE — Progress Notes (Deleted)
Established patient visit   {  This note template is in development as part of the [facility].   This template contains optional SmartLists. If no selections are made from an optional SmartList, it will be automatically removed when the note is signed.  Left clicking SmartLists that are in blue, underlined text will show additional information regarding the patient's history unless you are already editing the note in a pop-up window.  This text will be automatically removed from this note when it is signed.:1}   Patient: Kimberly [name]   DOB: 01-13-1946   73 y.o. Female  MRN: [id number] Visit Date: 01/10/2020  Today's healthcare provider: Lelon Huh, MD  Subjective:   No chief complaint on file.  HPI  Follow up Hospitalization  Patient was admitted to [facility] on 12/24/2019 and discharged on 12/25/2019. She was treated for Anaphylaxis, COPD exacerbation.  Treatment for this included START taking:diphenhydrAMINE (BENADRYL)  EPINEPHrine (EPI-PEN), and predniSONE (DELTASONE). Telephone follow up was done on N/A She reports good compliance with treatment. She reports this condition is {improved/worse/unchanged:3041574}.  ------------------------------------------------------------------------------------    {Show patient history (optional):23778::" "}   Medications: Outpatient Medications Prior to Visit  Medication Sig  . apixaban (ELIQUIS) 5 MG TABS tablet Take 1 tablet (5 mg total) by mouth 2 (two) times daily.  . budesonide-formoterol (SYMBICORT) 80-4.5 MCG/ACT inhaler Inhale 2 puffs into the lungs 2 (two) times daily.  . calcium citrate-vitamin D (CITRACAL+D) 315-200 MG-UNIT tablet Take 1 tablet by mouth daily.  . Cholecalciferol (VITAMIN D3) 50 MCG (2000 UT) TABS Take 2,000 Units by mouth daily.  Marland Kitchen dexamethasone (DECADRON) 4 MG tablet Take 2 tablets (8 mg total) by mouth daily. Take 2 tablets (8 mg) the day before treatment. Then, 2 tabs (8mg )  daily for 2 days after treatment.  . diphenhydrAMINE (BENADRYL) 25 MG tablet Take 2 tablets (50 mg total) by mouth at bedtime as needed for itching or allergies.  Marland Kitchen encorafenib (BRAFTOVI) 75 MG capsule Take 4 capsules (300 mg total) by mouth daily. (Patient not taking: Reported on 01/06/2020)  . EPINEPHrine 0.3 mg/0.3 mL IJ SOAJ injection Inject 0.3 mLs (0.3 mg total) into the muscle as needed for anaphylaxis.  Marland Kitchen gabapentin (NEURONTIN) 400 MG capsule TAKE 1 CAPSULE THREE TIMES DAILY (Patient taking differently: Take 600 mg by mouth 3 (three) times daily. )  . HYDROcodone-acetaminophen (NORCO/VICODIN) 5-325 MG tablet Take 1-2 tablets by mouth every 6 (six) hours as needed for moderate pain.  . meloxicam (MOBIC) 15 MG tablet TAKE 1 TABLET EVERY DAY AS NEEDED (Patient taking differently: Take 15 mg by mouth at bedtime. )  . metoprolol tartrate (LOPRESSOR) 25 MG tablet Take 1 tablet (25 mg total) by mouth 2 (two) times daily.  . Multiple Vitamins-Minerals (WOMENS MULTI VITAMIN & MINERAL PO) Take 2 tablets by mouth daily.   Marland Kitchen omeprazole (PRILOSEC) 20 MG capsule Take 1 capsule (20 mg total) by mouth daily.  . pravastatin (PRAVACHOL) 40 MG tablet TAKE 1 TABLET EVERY DAY (Patient taking differently: Take 40 mg by mouth daily. )  . predniSONE (DELTASONE) 10 MG tablet Take 40mg  daily for 3days,Take 30mg  daily for 3days,Take 20mg  daily for 3days,Take 10mg  daily for 3days, then stop (Patient not taking: Reported on 01/06/2020)  . Probiotic Product (PROBIOTIC DAILY PO) Take 1 capsule by mouth daily.   Marland Kitchen senna-docusate (SENOKOT-S) 8.6-50 MG tablet Take 2 tablets by mouth daily.   No facility-administered medications prior to visit.    Review of Systems  Constitutional: Negative.   Respiratory: Negative.   Cardiovascular: Negative.   Musculoskeletal: Negative.     {Show previous labs (optional):23779::" "}    Objective:    There were no vitals taken for this visit. {Show previous vital signs  (optional):23777::" "}  Physical Exam  ***  No results found for any visits on 01/10/20.    Assessment & Plan:    ***     Lelon Huh, MD  [facility] [phone] (phone) [phone] (fax)  [place]

## 2020-01-10 NOTE — H&P (Signed)
[place] at [place] NAME: Kimberly [name]    MR#:  [id number]  DATE OF BIRTH:  [birth year]  DATE OF ADMISSION:  01/10/2020  PRIMARY CARE PHYSICIAN: Birdie Sons, MD   REQUESTING/REFERRING PHYSICIAN: Dr Duffy Bruce  Patient coming from :Home with dter in ER   CHIEF COMPLAINT:  intermittently lower jaw and neck pressure.  HISTORY OF PRESENT ILLNESS:  Kimberly [name]  is a 74 y.o. female with a known history of COPD on chronic home oxygen, Kimberly [name], atrial fibrillation recently diagnosed in March 2021, history of metastatic colon cancer follows with Dr. Tasia Catchings at cancer center comes to the emergency room with jaw pain chest pressure shortness of breath and weakness  She was recently started on immunologic treatment for her colon cancer on Friday receiving an infusion and then starting oral Bratovi on Saturday. She developed progressively worsening fatigue, palpitation weakness and difficulty ambulating at home.  ED course: in the ER she was found to be in a fib with RVR heart rate was 130s two 150s. For ER physician he tried give IV potassium however she felt burning and weird feeling and hence that was discontinued. She is tolerating IV metoprolol well. Far she has had two doses of metoprolol.  Patient takes metoprolol 25 mg BID with eliquis at home. She follows with Dr. Nehemiah Massed as outpatient. It is being admitted with a fib with RVR. PAST MEDICAL HISTORY:   Past Medical History:  Diagnosis Date  . Breast cancer, left ([place]) 2004   Lumpectomy and rad tx's.  . Chronic back pain   . Colon cancer ([place]) 2018  . COPD (chronic obstructive pulmonary disease) ([place])   . Degenerative disc disease, lumbar 04/2013  . Family history of adverse reaction to anesthesia    son arrested after anesthesia  . GERD (gastroesophageal reflux disease)   . Iron deficiency anemia 05/27/2017  . Personal history of chemotherapy 2018   Colon  . Personal history of  radiation therapy    Breast 2004  . Personal history of radiation therapy 2018   Colon  . Postprocedural intraabdominal abscess 09/01/2018  . Small bowel obstruction ([place]) 04/16/2017  . Wears dentures    full upper    PAST SURGICAL HISTOIRY:   Past Surgical History:  Procedure Laterality Date  . APPENDECTOMY  1965  . BREAST EXCISIONAL BIOPSY Left 08/01/2003   lumpectomy rad 11/04-2/28/2005  . BREAST SURGERY    . CESAREAN SECTION     x3  . COLON SURGERY    . COLONOSCOPY W/ POLYPECTOMY    . COLONOSCOPY WITH PROPOFOL N/A 04/09/2018   Procedure: COLONOSCOPY WITH PROPOFOL;  Surgeon: Virgel Manifold, MD;  Location: [facility];  Service: Gastroenterology;  Laterality: N/A;  . COLONOSCOPY WITH PROPOFOL N/A 08/13/2019   Procedure: COLONOSCOPY WITH PROPOFOL;  Surgeon: Virgel Manifold, MD;  Location: [facility];  Service: Endoscopy;  Laterality: N/A;  . COLOSTOMY TAKEDOWN N/A 06/25/2018   Procedure: COLOSTOMY TAKEDOWN;  Surgeon: Jules Husbands, MD;  Location: [facility];  Service: General;  Laterality: N/A;  . ESOPHAGOGASTRODUODENOSCOPY (EGD) WITH PROPOFOL N/A 04/04/2017   Procedure: ESOPHAGOGASTRODUODENOSCOPY (EGD) WITH PROPOFOL;  Surgeon: [name], MD;  Location: [facility];  Service: Endoscopy;  Laterality: N/A;  . FRACTURE SURGERY    . HIP FRACTURE SURGERY Left 01/25/2012  . LAPAROSCOPY N/A 12/07/2019   Procedure: LAPAROSCOPY DIAGNOSTIC;  Surgeon: Jules Husbands, MD;  Location: [facility];  Service: General;  Laterality: N/A;  .  LAPAROTOMY N/A 04/15/2017   Procedure: EXPLORATORY LAPAROTOMY;  Surgeon: Clayburn Pert, MD;  Location: [facility];  Service: General;  Laterality: N/A;  . NECK SURGERY  12/2011  . PARASTOMAL HERNIA REPAIR N/A 12/03/2017   Procedure: HERNIA REPAIR PARASTOMAL;  Surgeon: Jules Husbands, MD;  Location: [facility];  Service: General;  Laterality: N/A;  . PARASTOMAL HERNIA REPAIR N/A 06/25/2018   Procedure: HERNIA REPAIR PARASTOMAL;  Surgeon:  Jules Husbands, MD;  Location: [facility];  Service: General;  Laterality: N/A;  . PORTACATH PLACEMENT Right 05/21/2017   Procedure: INSERTION PORT-A-CATH;  Surgeon: Clayburn Pert, MD;  Location: [facility];  Service: General;  Laterality: Right;  . PORTACATH PLACEMENT Right 12/07/2019   Procedure: INSERTION PORT-A-CATH;  Surgeon: Jules Husbands, MD;  Location: [facility];  Service: General;  Laterality: Right;  . WRIST FRACTURE SURGERY      SOCIAL HISTORY:   Social History   Tobacco Use  . Smoking status: Former Smoker    Packs/day: 0.25    Years: 55.00    Pack years: 13.75    Types: Cigarettes    Quit date: 05/21/2017    Years since quitting: 2.6  . Smokeless tobacco: Never Used  . Tobacco comment: started age 35 1/2 to 1 ppd  Substance Use Topics  . Alcohol use: Yes    Alcohol/week: 0.0 standard drinks    Comment: occasionally drinks beer    FAMILY HISTORY:   Family History  Problem Relation Age of Onset  . Diabetes Mother        type 2  . Coronary artery disease Mother   . Deep vein thrombosis Mother   . Colon cancer Father   . Asthma Brother   . Diabetes Brother        type 2  . Breast cancer Neg Hx     DRUG ALLERGIES:   Allergies  Allergen Reactions  . Erbitux [Cetuximab] Anaphylaxis  . Betadine [Povidone Iodine] Other (See Comments)    Topical iodine she states "if you put it in an open sore it will cause a eating ulcer."  . Iodine Other (See Comments)    Patient states "it gives me infections"  . Other Nausea And Vomiting    Antihystamines  . Sinus Formula [Cholestatin] Nausea Only    REVIEW OF SYSTEMS:  Review of Systems  Constitutional: Negative for chills, fever and weight loss.  HENT: Negative for ear discharge, ear pain and nosebleeds.   Eyes: Negative for blurred vision, pain and discharge.  Respiratory: Positive for cough and shortness of breath. Negative for sputum production, wheezing and stridor.   Cardiovascular: Positive for chest pain and  orthopnea. Negative for palpitations and PND.  Gastrointestinal: Negative for abdominal pain, diarrhea, nausea and vomiting.  Genitourinary: Negative for frequency and urgency.  Musculoskeletal: Negative for back pain and joint pain.  Neurological: Positive for weakness. Negative for sensory change, speech change and focal weakness.  Psychiatric/Behavioral: Negative for depression and hallucinations. The patient is not nervous/anxious.      MEDICATIONS AT HOME:   Prior to Admission medications   Medication Sig Start Date End Date Taking? Authorizing Provider  apixaban (ELIQUIS) 5 MG TABS tablet Take 1 tablet (5 mg total) by mouth 2 (two) times daily. 12/10/19 01/09/20  Sidney Ace, MD  budesonide-formoterol (SYMBICORT) 80-4.5 MCG/ACT inhaler Inhale 2 puffs into the lungs 2 (two) times daily. 12/25/19   Lavina Hamman, MD  calcium citrate-vitamin D (CITRACAL+D) 315-200 MG-UNIT tablet Take 1 tablet by mouth  daily.    [provider]  Cholecalciferol (VITAMIN D3) 50 MCG (2000 UT) TABS Take 2,000 Units by mouth daily. 11/05/19   Birdie Sons, MD  dexamethasone (DECADRON) 4 MG tablet Take 2 tablets (8 mg total) by mouth daily. Take 2 tablets (8 mg) the day before treatment. Then, 2 tabs (8mg ) daily for 2 days after treatment. 12/31/19   Earlie Server, MD  diphenhydrAMINE (BENADRYL) 25 MG tablet Take 2 tablets (50 mg total) by mouth at bedtime as needed for itching or allergies. 12/25/19   Lavina Hamman, MD  encorafenib (BRAFTOVI) 75 MG capsule Take 4 capsules (300 mg total) by mouth daily. Patient not taking: Reported on 01/06/2020 12/29/19   Earlie Server, MD  EPINEPHrine 0.3 mg/0.3 mL IJ SOAJ injection Inject 0.3 mLs (0.3 mg total) into the muscle as needed for anaphylaxis. 12/25/19   Lavina Hamman, MD  gabapentin (NEURONTIN) 400 MG capsule TAKE 1 CAPSULE THREE TIMES DAILY Patient taking differently: Take 600 mg by mouth 3 (three) times daily.  11/08/19   Earlie Server, MD  HYDROcodone-acetaminophen  (NORCO/VICODIN) 5-325 MG tablet Take 1-2 tablets by mouth every 6 (six) hours as needed for moderate pain. 01/03/20   Birdie Sons, MD  meloxicam (MOBIC) 15 MG tablet TAKE 1 TABLET EVERY DAY AS NEEDED Patient taking differently: Take 15 mg by mouth at bedtime.  05/20/19   Birdie Sons, MD  metoprolol tartrate (LOPRESSOR) 25 MG tablet Take 1 tablet (25 mg total) by mouth 2 (two) times daily. 12/10/19 01/09/20  Sidney Ace, MD  Multiple Vitamins-Minerals (WOMENS MULTI VITAMIN & MINERAL PO) Take 2 tablets by mouth daily.     [provider]  omeprazole (PRILOSEC) 20 MG capsule Take 1 capsule (20 mg total) by mouth daily. 03/10/19   Birdie Sons, MD  pravastatin (PRAVACHOL) 40 MG tablet TAKE 1 TABLET EVERY DAY Patient taking differently: Take 40 mg by mouth daily.  05/20/19   Birdie Sons, MD  predniSONE (DELTASONE) 10 MG tablet Take 40mg  daily for 3days,Take 30mg  daily for 3days,Take 20mg  daily for 3days,Take 10mg  daily for 3days, then stop Patient not taking: Reported on 01/06/2020 12/25/19   Lavina Hamman, MD  Probiotic Product (PROBIOTIC DAILY PO) Take 1 capsule by mouth daily.     [provider]  senna-docusate (SENOKOT-S) 8.6-50 MG tablet Take 2 tablets by mouth daily. 12/24/19   Earlie Server, MD      VITAL SIGNS:  Blood pressure 102/71, pulse (!) 151, temperature 97.7 F (36.5 C), temperature source Oral, resp. rate (!) 27, height 5\' 4"  (1.626 m), weight 72.5 kg, SpO2 91 %.  PHYSICAL EXAMINATION:  GENERAL:  74 y.o.-year-old patient lying in the bed with no acute distress. Appears deconditioned and chronically ill. EYES: Pupils equal, round, reactive to light and accommodation. No scleral icterus.  HEENT: Head atraumatic, normocephalic. Oropharynx and nasopharynx clear.  NECK:  Supple, no jugular venous distention. No thyroid enlargement, no tenderness.  LUNGS: coarse breath sounds bilaterally, no wheezing, rales,rhonchi or crepitation. No use of accessory  muscles of respiration.  CARDIOVASCULAR: S1, S2 normal. No murmurs, rubs, or gallops. Tachycardia, irregular irregular rhythm ABDOMEN: Soft, nontender, nondistended. Bowel sounds present. No organomegaly or mass.  EXTREMITIES: No pedal edema, cyanosis, or clubbing.  NEUROLOGIC: Cranial nerves II through XII are intact. Muscle strength 5/5 in all extremities. Sensation intact. Gait not checked.  PSYCHIATRIC: The patient is alert and oriented x 3.  SKIN: No obvious rash, lesion, or ulcer.  LABORATORY PANEL:   CBC Recent Labs  Lab 01/10/20 1316  WBC 15.1*  HGB 13.5  HCT 43.0  PLT 291   ------------------------------------------------------------------------------------------------------------------  Chemistries  Recent Labs  Lab 01/10/20 1316  NA 135  K 4.5  CL 103  CO2 21*  GLUCOSE 169*  BUN 36*  CREATININE 0.94  CALCIUM 8.2*  MG 2.0  AST 16  ALT 17  ALKPHOS 65  BILITOT 1.1   ------------------------------------------------------------------------------------------------------------------  Cardiac Enzymes No results for input(s): TROPONINI in the last 168 hours. ------------------------------------------------------------------------------------------------------------------  RADIOLOGY:  DG Chest Portable 1 View  Result Date: 01/10/2020 CLINICAL DATA:  Shortness of breath, chest pain, symptoms since Saturday, history colon cancer, melanoma EXAM: PORTABLE CHEST 1 VIEW COMPARISON:  Portable exam 1347 hours compared to 12/24/2019 FINDINGS: RIGHT jugular Port-A-Cath with tip projecting over SVC. Upper normal heart size. Mediastinal contours and pulmonary vascularity normal. Atherosclerotic calcification aorta. Minimal bibasilar atelectasis. Lungs otherwise clear. No infiltrate, pleural effusion or pneumothorax. Bones demineralized. IMPRESSION: Minimal bibasilar atelectasis. Electronically Signed   By: Lavonia Dana M.D.   On: 01/10/2020 13:54    EKG:   A fib with  RVR IMPRESSION AND PLAN:   Kimberly [name]  is a 74 y.o. female with a known history of COPD on chronic home oxygen, Kimberly [name], atrial fibrillation recently diagnosed in March 2021, history of metastatic colon cancer follows with Dr. Tasia Catchings at cancer center comes to the emergency room with jaw pain chest pressure shortness of breath and weakness  1. Atrial fibrillation with RVR in the setting of side effects to immunotherapy started on Friday at the cancer center per Dr. Tasia Catchings for metastatic colon cancer -admit to progressive cardiac floor -IV prn  metoprolol -oral eliquis -resume metoprolol 25 mg BID-- home dose -Dr. Ubaldo Glassing to see patient -Echo was done in March 2021 will not repeat it  2. COPD/chronic respiratory failure on chronic home oxygen -continue home inhalers and Singulair -PRN nebulizer  3. Leucocystosis -no source of infection identified. Patient has chronic cough. -No fever will continue to monitor  4. Chronic back pain -you pain meds  5.GERD    Family Communication :dte rin ER Consults :Dr Ubaldo Glassing cardiology Code Status :FULL-- this was clarified with patient's daughter in the emergency room. Patient also agrees she wants to be full code DVT prophylaxis :eliquis Discharge disposition: patient is from home. Patient will discharged to home once her a fib is under control 1 to 2 days  TOTAL TIME TAKING CARE OF THIS PATIENT: *50* minutes.    Fritzi Mandes M.D  [facility]     CC: Primary care physician; Birdie Sons, MD

## 2020-01-10 NOTE — Telephone Encounter (Signed)
Patient called reporting that the medicine Kimberly [name] is not agreeing with her. Head ache red face jaws hurt when she chews pressure in chest so weak she can hardly walk. States she is not going to take it any more. Please advise

## 2020-01-10 NOTE — Consult Note (Signed)
Cardiology Consultation Note    Patient ID: Kimberly [name], MRN: [id number], DOB/AGE: [birth year] 74 y.o. Admit date: 01/10/2020   Date of Consult: 01/10/2020 Primary Physician: Birdie Sons, MD Primary Cardiologist: Dr. Nehemiah Massed  Chief Complaint: afib Reason for Consultation: afib with rvr Requesting MD: Dr. Fritzi Mandes  HPI: Kimberly [name] is a 74 y.o. female with history of COPD, Afib, metastatic colon CA, GERD who presented to the er with complaints of chest pressure, jaw pain and sob. Recently was started on a new chemotherapeutic agent infusion and then was started on oral Braltovi. Since starting this she has noted increasing sob and chest tightness. Was dx with new onset afib in March. Was in afib with rvr on presentation on this admission. Was on apixaban 5 mg bid and metoprolol tartrate 25 bid. Apparently did not tolerate cardizem. Echo on previous admission revealed ef of 50-55% with no significant valve abnormalities. Troponin was 24. BNP 1158. TSH 2.928. K was normal at 4.5 and normal renal function. CXR revealed mild bibasilar atelectasis.  Patient has been compliant with her medications at home.  She started feeling poorly due this Saturday after starting the oral agent.  Patient is not able to tell whether or not she is in A. fib.  Past Medical History:  Diagnosis Date  . Breast cancer, left ([place]) 2004   Lumpectomy and rad tx's.  . Chronic back pain   . Colon cancer ([place]) 2018  . COPD (chronic obstructive pulmonary disease) ([place])   . Degenerative disc disease, lumbar 04/2013  . Family history of adverse reaction to anesthesia    son arrested after anesthesia  . GERD (gastroesophageal reflux disease)   . Iron deficiency anemia 05/27/2017  . Personal history of chemotherapy 2018   Colon  . Personal history of radiation therapy    Breast 2004  . Personal history of radiation therapy 2018   Colon  . Postprocedural intraabdominal abscess 09/01/2018  . Small bowel obstruction  ([place]) 04/16/2017  . Wears dentures    full upper      Surgical History:  Past Surgical History:  Procedure Laterality Date  . APPENDECTOMY  1965  . BREAST EXCISIONAL BIOPSY Left 08/01/2003   lumpectomy rad 11/04-2/28/2005  . BREAST SURGERY    . CESAREAN SECTION     x3  . COLON SURGERY    . COLONOSCOPY W/ POLYPECTOMY    . COLONOSCOPY WITH PROPOFOL N/A 04/09/2018   Procedure: COLONOSCOPY WITH PROPOFOL;  Surgeon: Virgel Manifold, MD;  Location: [facility];  Service: Gastroenterology;  Laterality: N/A;  . COLONOSCOPY WITH PROPOFOL N/A 08/13/2019   Procedure: COLONOSCOPY WITH PROPOFOL;  Surgeon: Virgel Manifold, MD;  Location: [place];  Service: Endoscopy;  Laterality: N/A;  . COLOSTOMY TAKEDOWN N/A 06/25/2018   Procedure: COLOSTOMY TAKEDOWN;  Surgeon: Jules Husbands, MD;  Location: [facility];  Service: General;  Laterality: N/A;  . ESOPHAGOGASTRODUODENOSCOPY (EGD) WITH PROPOFOL N/A 04/04/2017   Procedure: ESOPHAGOGASTRODUODENOSCOPY (EGD) WITH PROPOFOL;  Surgeon: [name], MD;  Location: [place];  Service: Endoscopy;  Laterality: N/A;  . FRACTURE SURGERY    . HIP FRACTURE SURGERY Left 01/25/2012  . LAPAROSCOPY N/A 12/07/2019   Procedure: LAPAROSCOPY DIAGNOSTIC;  Surgeon: Jules Husbands, MD;  Location: [facility];  Service: General;  Laterality: N/A;  . LAPAROTOMY N/A 04/15/2017   Procedure: EXPLORATORY LAPAROTOMY;  Surgeon: Clayburn Pert, MD;  Location: [facility];  Service: General;  Laterality: N/A;  . NECK SURGERY  12/2011  .  PARASTOMAL HERNIA REPAIR N/A 12/03/2017   Procedure: HERNIA REPAIR PARASTOMAL;  Surgeon: Jules Husbands, MD;  Location: [facility];  Service: General;  Laterality: N/A;  . PARASTOMAL HERNIA REPAIR N/A 06/25/2018   Procedure: HERNIA REPAIR PARASTOMAL;  Surgeon: Jules Husbands, MD;  Location: [facility];  Service: General;  Laterality: N/A;  . PORTACATH PLACEMENT Right 05/21/2017   Procedure: INSERTION PORT-A-CATH;  Surgeon: Clayburn Pert, MD;  Location: [facility];  Service: General;  Laterality: Right;  . PORTACATH PLACEMENT Right 12/07/2019   Procedure: INSERTION PORT-A-CATH;  Surgeon: Jules Husbands, MD;  Location: [facility];  Service: General;  Laterality: Right;  . WRIST FRACTURE SURGERY       Home Meds: Prior to Admission medications   Medication Sig Start Date End Date Taking? Authorizing Provider  apixaban (ELIQUIS) 5 MG TABS tablet Take 1 tablet (5 mg total) by mouth 2 (two) times daily. 12/10/19 01/09/20  Sidney Ace, MD  budesonide-formoterol (SYMBICORT) 80-4.5 MCG/ACT inhaler Inhale 2 puffs into the lungs 2 (two) times daily. 12/25/19   Lavina Hamman, MD  calcium citrate-vitamin D (CITRACAL+D) 315-200 MG-UNIT tablet Take 1 tablet by mouth daily.    [provider]  Cholecalciferol (VITAMIN D3) 50 MCG (2000 UT) TABS Take 2,000 Units by mouth daily. 11/05/19   Birdie Sons, MD  dexamethasone (DECADRON) 4 MG tablet Take 2 tablets (8 mg total) by mouth daily. Take 2 tablets (8 mg) the day before treatment. Then, 2 tabs (8mg ) daily for 2 days after treatment. 12/31/19   Earlie Server, MD  diphenhydrAMINE (BENADRYL) 25 MG tablet Take 2 tablets (50 mg total) by mouth at bedtime as needed for itching or allergies. 12/25/19   Lavina Hamman, MD  encorafenib (BRAFTOVI) 75 MG capsule Take 4 capsules (300 mg total) by mouth daily. Patient not taking: Reported on 01/06/2020 12/29/19   Earlie Server, MD  EPINEPHrine 0.3 mg/0.3 mL IJ SOAJ injection Inject 0.3 mLs (0.3 mg total) into the muscle as needed for anaphylaxis. 12/25/19   Lavina Hamman, MD  gabapentin (NEURONTIN) 400 MG capsule TAKE 1 CAPSULE THREE TIMES DAILY Patient taking differently: Take 600 mg by mouth 3 (three) times daily.  11/08/19   Earlie Server, MD  HYDROcodone-acetaminophen (NORCO/VICODIN) 5-325 MG tablet Take 1-2 tablets by mouth every 6 (six) hours as needed for moderate pain. 01/03/20   Birdie Sons, MD  meloxicam (MOBIC) 15 MG tablet TAKE 1 TABLET EVERY  DAY AS NEEDED Patient taking differently: Take 15 mg by mouth at bedtime.  05/20/19   Birdie Sons, MD  metoprolol tartrate (LOPRESSOR) 25 MG tablet Take 1 tablet (25 mg total) by mouth 2 (two) times daily. 12/10/19 01/09/20  Sidney Ace, MD  Multiple Vitamins-Minerals (WOMENS MULTI VITAMIN & MINERAL PO) Take 2 tablets by mouth daily.     [provider]  omeprazole (PRILOSEC) 20 MG capsule Take 1 capsule (20 mg total) by mouth daily. 03/10/19   Birdie Sons, MD  pravastatin (PRAVACHOL) 40 MG tablet TAKE 1 TABLET EVERY DAY Patient taking differently: Take 40 mg by mouth daily.  05/20/19   Birdie Sons, MD  predniSONE (DELTASONE) 10 MG tablet Take 40mg  daily for 3days,Take 30mg  daily for 3days,Take 20mg  daily for 3days,Take 10mg  daily for 3days, then stop Patient not taking: Reported on 01/06/2020 12/25/19   Lavina Hamman, MD  Probiotic Product (PROBIOTIC DAILY PO) Take 1 capsule by mouth daily.     [provider]  senna-docusate (  SENOKOT-S) 8.6-50 MG tablet Take 2 tablets by mouth daily. 12/24/19   Earlie Server, MD    Inpatient Medications:  . diphenhydrAMINE      . metoprolol tartrate  5 mg Intravenous Once     Allergies:  Allergies  Allergen Reactions  . Erbitux [Cetuximab] Anaphylaxis  . Betadine [Povidone Iodine] Other (See Comments)    Topical iodine she states "if you put it in an open sore it will cause a eating ulcer."  . Iodine Other (See Comments)    Patient states "it gives me infections"  . Other Nausea And Vomiting    Antihystamines  . Sinus Formula [Cholestatin] Nausea Only    Social History   Socioeconomic History  . Marital status: Divorced    Spouse name: Not on file  . Number of children: 3  . Years of education: Not on file  . Highest education level: 7th grade  Occupational History  . Occupation: retired  Tobacco Use  . Smoking status: Former Smoker    Packs/day: 0.25    Years: 55.00    Pack years: 13.75    Types:  Cigarettes    Quit date: 05/21/2017    Years since quitting: 2.6  . Smokeless tobacco: Never Used  . Tobacco comment: started age 85 1/2 to 1 ppd  Substance and Sexual Activity  . Alcohol use: Yes    Alcohol/week: 0.0 standard drinks    Comment: occasionally drinks beer  . Drug use: No  . Sexual activity: Never  Other Topics Concern  . Not on file  Social History Narrative   Lives at home alone   Social Determinants of Health   Financial Resource Strain: Medium Risk  . Difficulty of Paying Living Expenses: Somewhat hard  Food Insecurity:   . Worried About Charity fundraiser in the Last Year:   . Arboriculturist in the Last Year:   Transportation Needs:   . Film/video editor (Medical):   Marland Kitchen Lack of Transportation (Non-Medical):   Physical Activity: Inactive  . Days of Exercise per Week: 0 days  . Minutes of Exercise per Session: 0 min  Stress:   . Feeling of Stress :   Social Connections: Unknown  . Frequency of Communication with Friends and Family: Patient refused  . Frequency of Social Gatherings with Friends and Family: Patient refused  . Attends Religious Services: Patient refused  . Active Member of Clubs or Organizations: Patient refused  . Attends Archivist Meetings: Patient refused  . Marital Status: Patient refused  Intimate Partner Violence: Unknown  . Fear of Current or Ex-Partner: Patient refused  . Emotionally Abused: Patient refused  . Physically Abused: Patient refused  . Sexually Abused: Patient refused     Family History  Problem Relation Age of Onset  . Diabetes Mother        type 2  . Coronary artery disease Mother   . Deep vein thrombosis Mother   . Colon cancer Father   . Asthma Brother   . Diabetes Brother        type 2  . Breast cancer Neg Hx      Review of Systems: A 12-system review of systems was performed and is negative except as noted in the HPI.  Labs: No results for input(s): CKTOTAL, CKMB, TROPONINI in the  last 72 hours. Lab Results  Component Value Date   WBC 15.1 (H) 01/10/2020   HGB 13.5 01/10/2020   HCT 43.0 01/10/2020  MCV 87.4 01/10/2020   PLT 291 01/10/2020    Recent Labs  Lab 01/10/20 1316  NA 135  K 4.5  CL 103  CO2 21*  BUN 36*  CREATININE 0.94  CALCIUM 8.2*  PROT 6.6  BILITOT 1.1  ALKPHOS 65  ALT 17  AST 16  GLUCOSE 169*   Lab Results  Component Value Date   CHOL 155 11/03/2019   HDL 43 11/03/2019   LDLCALC 76 11/03/2019   TRIG 216 (H) 11/03/2019   No results found for: DDIMER  Radiology/Studies:  DG Chest Portable 1 View  Result Date: 01/10/2020 CLINICAL DATA:  Shortness of breath, chest pain, symptoms since Saturday, history colon cancer, melanoma EXAM: PORTABLE CHEST 1 VIEW COMPARISON:  Portable exam 1347 hours compared to 12/24/2019 FINDINGS: RIGHT jugular Port-A-Cath with tip projecting over SVC. Upper normal heart size. Mediastinal contours and pulmonary vascularity normal. Atherosclerotic calcification aorta. Minimal bibasilar atelectasis. Lungs otherwise clear. No infiltrate, pleural effusion or pneumothorax. Bones demineralized. IMPRESSION: Minimal bibasilar atelectasis. Electronically Signed   By: Lavonia Dana M.D.   On: 01/10/2020 13:54   DG Chest Portable 1 View  Result Date: 12/24/2019 CLINICAL DATA:  Shortness of breath and urticaria EXAM: PORTABLE CHEST 1 VIEW COMPARISON:  December 08, 2019 FINDINGS: Port-A-Cath tip is in the superior vena cava. No pneumothorax. The lungs are clear. Heart is upper normal in size with pulmonary vascularity normal. No adenopathy. There is aortic atherosclerosis. No bone lesions. IMPRESSION: Stable Port-A-Cath position. No pneumothorax. No edema or airspace opacity. Stable cardiac silhouette. No adenopathy. Aortic Atherosclerosis (ICD10-I70.0). Electronically Signed   By: Lowella Grip III M.D.   On: 12/24/2019 11:45    Wt Readings from Last 3 Encounters:  01/10/20 72.5 kg  01/07/20 72.5 kg  12/31/19 71.6 kg     EKG: Atrial fibrillation with rapid ventricular response  Physical Exam:  Blood pressure 102/71, pulse (!) 151, temperature 97.7 F (36.5 C), temperature source Oral, resp. rate (!) 27, height 5\' 4"  (1.626 m), weight 72.5 kg, SpO2 91 %. Body mass index is 27.44 kg/m. General: Well developed, well nourished, in no acute distress. Head: Normocephalic, atraumatic, sclera non-icteric, no xanthomas, nares are without discharge.  Neck: Negative for carotid bruits. JVD not elevated. Lungs: Clear bilaterally to auscultation without wheezes, rales, or rhonchi. Breathing is unlabored. Heart: Irregular regular rhythm Abdomen: Soft, non-tender, non-distended with normoactive bowel sounds. No hepatomegaly. No rebound/guarding. No obvious abdominal masses. Msk:  Strength and tone appear normal for age. Extremities: No clubbing or cyanosis. No edema.  Distal pedal pulses are 2+ and equal bilaterally. Neuro: Alert and oriented X 3. No facial asymmetry. No focal deficit. Moves all extremities spontaneously. Psych:  Responds to questions appropriately with a normal affect.     Assessment and Plan  74 year old female with history of COPD, A. fib, metastatic colon carcinoma with recent diagnosis of atrial fibrillation.  She recently received new treatment for her carcinoma.  She had been on apixaban and metoprolol tartrate 25 twice daily which reportedly suppressed her rhythm although she is not able to tell whether she is in A. fib or not.  After taking a new oral agent on Saturday she began noting jaw pain and fatigue.  She was evaluated and noted to be tachycardic and sent to the emergency room.  She was noted to be in A. fib with RVR.  Was given IV Cardizem with some evidence of intolerance with her reaction.  Has received metoprolol IV with some improvement in her rate.  On my exam her rate is between the mid 90s to the low 130s.  She is hemodynamically stable.  The jaw pain is improved.  Will change her  metoprolol to tartrate to 25 3 times daily and see if this improves her rate.  Consideration for higher doses or converting to a more aggressive antiarrhythmic agent may be needed.  We will follow with you.  Signed, Teodoro Spray MD 01/10/2020, 3:47 PM Pager: [phone]

## 2020-01-11 ENCOUNTER — Telehealth: Payer: Self-pay

## 2020-01-11 DIAGNOSIS — C189 Malignant neoplasm of colon, unspecified: Secondary | ICD-10-CM | POA: Diagnosis not present

## 2020-01-11 DIAGNOSIS — J449 Chronic obstructive pulmonary disease, unspecified: Secondary | ICD-10-CM | POA: Diagnosis not present

## 2020-01-11 DIAGNOSIS — C799 Secondary malignant neoplasm of unspecified site: Secondary | ICD-10-CM | POA: Diagnosis not present

## 2020-01-11 DIAGNOSIS — I4891 Unspecified atrial fibrillation: Secondary | ICD-10-CM | POA: Diagnosis not present

## 2020-01-11 MED ORDER — METOPROLOL TARTRATE 50 MG PO TABS
50.0000 mg | ORAL_TABLET | Freq: Two times a day (BID) | ORAL | Status: DC
  Administered 2020-01-11: 50 mg via ORAL
  Filled 2020-01-11: qty 1

## 2020-01-11 MED ORDER — METOPROLOL TARTRATE 50 MG PO TABS: 50.0000 mg | ORAL_TABLET | Freq: Two times a day (BID) | ORAL | 0 refills | Status: DC

## 2020-01-11 MED ORDER — CHLORHEXIDINE GLUCONATE CLOTH 2 % EX PADS: 6.0000 | MEDICATED_PAD | Freq: Every day | CUTANEOUS | Status: DC

## 2020-01-11 NOTE — Progress Notes (Signed)
In bed resting. A/O x4. Skin warm, dry and pink. Respirations nonlabored. HR continues to be in 130's-160's Afib. Metoprolol 5mg  IVP given.

## 2020-01-11 NOTE — Progress Notes (Signed)
HR controlled Afib 98.

## 2020-01-11 NOTE — Progress Notes (Signed)
O2 sat 98% on 2l/[place].

## 2020-01-11 NOTE — Discharge Instructions (Signed)
USe your oxygen as before

## 2020-01-11 NOTE — Care Management CC44 (Signed)
Condition Code 44 Documentation Completed  Patient Details  Name: Kimberly [name] MRN: [id number] Date of Birth: [birth year]   Condition Code 44 given:  Yes Patient signature on Condition Code 44 notice:  Yes Documentation of 2 MD's agreement:  Yes Code 44 added to claim:  Yes    Gerrianne Scale [name], LCSW 01/11/2020, 8:59 AM

## 2020-01-11 NOTE — Care Management Obs Status (Signed)
[facility] NOTIFICATION   Patient Details  Name: ZARIAH GLUCKMAN MRN: [id number] Date of Birth: [birth year]   Medicare Observation Status Notification Given:  Yes    Lovell Nuttall A Ivone Licht, LCSW 01/11/2020, 8:59 AM

## 2020-01-11 NOTE — Progress Notes (Signed)
Converted to NSR

## 2020-01-11 NOTE — Discharge Summary (Signed)
[facility] NAME: Kimberly [name]    MR#:  [id number]  DATE OF BIRTH:  17-Jun-1946  DATE OF ADMISSION:  01/10/2020 ADMITTING PHYSICIAN: Fritzi Mandes, MD  DATE OF DISCHARGE: 01/11/2020  PRIMARY CARE PHYSICIAN: Birdie Sons, MD    ADMISSION DIAGNOSIS:  A-fib ([place]) [I48.91] Atrial fibrillation with rapid ventricular response ([place]) [I48.91] Metastatic malignant neoplasm, unspecified site ([place]) [C79.9]  DISCHARGE DIAGNOSIS:  Afib with RVR--now in SR H/o Paroxysmal afib  SECONDARY DIAGNOSIS:   Past Medical History:  Diagnosis Date  . Breast cancer, left ([place]) 2004   Lumpectomy and rad tx's.  . Chronic back pain   . Colon cancer ([place]) 2018  . COPD (chronic obstructive pulmonary disease) ([place])   . Degenerative disc disease, lumbar 04/2013  . Family history of adverse reaction to anesthesia    son arrested after anesthesia  . GERD (gastroesophageal reflux disease)   . Iron deficiency anemia 05/27/2017  . Personal history of chemotherapy 2018   Colon  . Personal history of radiation therapy    Breast 2004  . Personal history of radiation therapy 2018   Colon  . Postprocedural intraabdominal abscess 09/01/2018  . Small bowel obstruction ([place]) 04/16/2017  . Wears dentures    full upper    HOSPITAL COURSE:  Kimberly [name]  is a 74 y.o. female with a known history of COPD on chronic home oxygen, Kimberly [name], atrial fibrillation recently diagnosed in March 2021, history of  colon cancer, breast cancer follows with Dr. Tasia Catchings at cancer center comes to the emergency room with jaw pain chest pressure shortness of breath and weakness  1. Atrial fibrillation with RVR -she started taking a new immunotherapy drug started on Friday and had some symptoms of chest pressure, jaw pain and wekness -followed at Cancer center per Dr. Tasia Catchings for metastatic colon cancer -IV prn  metoprolol -oral eliquis -metoprolol 25 mg BID home dose --Increased to Metoprolol 50 mg  bid -Dr. Ubaldo Glassing appreciate input--ok to d/c home -Echo was done in March 2021 will not repeat it--EF 55-60% -pt is NSR--feels good and wants to go home  2. COPD/chronic respiratory failure on chronic home oxygen -continue home inhalers and Singulair -PRN nebulizer  3. Leucocystosis -no source of infection identified. Patient has chronic cough. -No fever will continue to monitor  4. Chronic back pain;lumbar spine djd -cont pain meds  5.GERD On ppi  6.Stage III b colon cancer -pt will f/u Dr Tasia Catchings on Friday may 16th.  overall stable. Ambulate D/c home today  Family Communication :dte rin ER Consults :Dr Ubaldo Glassing cardiology Code Status :FULL-- this was clarified with patient's daughter in the emergency room. Patient also agrees she wants to be full code DVT prophylaxis :eliquis Discharge disposition: patient is from home. Patient will discharged to home  today   CONSULTS OBTAINED:  Treatment Team:  Teodoro Spray, MD  DRUG ALLERGIES:   Allergies  Allergen Reactions  . Erbitux [Cetuximab] Anaphylaxis  . Betadine [Povidone Iodine] Other (See Comments)    Topical iodine she states "if you put it in an open sore it will cause a eating ulcer."  . Iodine Other (See Comments)    Patient states "it gives me infections"  . Other Nausea And Vomiting    Antihystamines  . Sinus Formula [Cholestatin] Nausea Only    DISCHARGE MEDICATIONS:   Allergies as of 01/11/2020      Reactions   Erbitux [cetuximab] Anaphylaxis   Betadine [povidone Iodine] Other (  See Comments)   Topical iodine she states "if you put it in an open sore it will cause a eating ulcer."   Iodine Other (See Comments)   Patient states "it gives me infections"   Other Nausea And Vomiting   Antihystamines   Sinus Formula [cholestatin] Nausea Only      Medication List    STOP taking these medications   encorafenib 75 MG capsule Commonly known as: BRAFTOVI     TAKE these medications   apixaban 5 MG  Tabs tablet Commonly known as: ELIQUIS Take 1 tablet (5 mg total) by mouth 2 (two) times daily.   budesonide-formoterol 80-4.5 MCG/ACT inhaler Commonly known as: SYMBICORT Inhale 2 puffs into the lungs 2 (two) times daily.   calcium citrate-vitamin D 315-200 MG-UNIT tablet Commonly known as: CITRACAL+D Take 1 tablet by mouth daily.   diphenhydrAMINE 25 MG tablet Commonly known as: BENADRYL Take 2 tablets (50 mg total) by mouth at bedtime as needed for itching or allergies.   EPINEPHrine 0.3 mg/0.3 mL Soaj injection Commonly known as: EPI-PEN Inject 0.3 mLs (0.3 mg total) into the muscle as needed for anaphylaxis.   gabapentin 400 MG capsule Commonly known as: NEURONTIN TAKE 1 CAPSULE THREE TIMES DAILY What changed: See the new instructions.   HYDROcodone-acetaminophen 5-325 MG tablet Commonly known as: NORCO/VICODIN Take 1-2 tablets by mouth every 6 (six) hours as needed for moderate pain.   meloxicam 15 MG tablet Commonly known as: MOBIC TAKE 1 TABLET EVERY DAY AS NEEDED What changed: See the new instructions.   metoprolol tartrate 50 MG tablet Commonly known as: LOPRESSOR Take 1 tablet (50 mg total) by mouth 2 (two) times daily. What changed:  medication strength how much to take   omeprazole 20 MG capsule Commonly known as: PRILOSEC Take 1 capsule (20 mg total) by mouth daily.   pravastatin 40 MG tablet Commonly known as: PRAVACHOL TAKE 1 TABLET EVERY DAY   PROBIOTIC DAILY PO Take 1 capsule by mouth daily.   senna-docusate 8.6-50 MG tablet Commonly known as: Senokot-S Take 2 tablets by mouth daily.   Vitamin D3 50 MCG (2000 UT) Tabs Take 2,000 Units by mouth daily.   WOMENS MULTI VITAMIN & MINERAL PO Take 2 tablets by mouth daily.       If you experience worsening of your admission symptoms, develop shortness of breath, life threatening emergency, suicidal or homicidal thoughts you must seek medical attention immediately by calling 911 or calling  your MD immediately  if symptoms less severe.  You Must read complete instructions/literature along with all the possible adverse reactions/side effects for all the Medicines you take and that have been prescribed to you. Take any new Medicines after you have completely understood and accept all the possible adverse reactions/side effects.   Please note  You were cared for by a hospitalist during your hospital stay. If you have any questions about your discharge medications or the care you received while you were in the hospital after you are discharged, you can call the unit and asked to speak with the hospitalist on call if the hospitalist that took care of you is not available. Once you are discharged, your primary care physician will handle any further medical issues. Please note that NO REFILLS for any discharge medications will be authorized once you are discharged, as it is imperative that you return to your primary care physician (or establish a relationship with a primary care physician if you do not have one) for your  aftercare needs so that they can reassess your need for medications and monitor your lab values. Today   SUBJECTIVE   Ding well converted to NSR No cp or sob Slept well  VITAL SIGNS:  Blood pressure (!) 125/59, pulse 67, temperature 98.4 F (36.9 C), temperature source Oral, resp. rate 16, height 5\' 4"  (1.626 m), weight 69.5 kg, SpO2 95 %.  I/O:    Intake/Output Summary (Last 24 hours) at 01/11/2020 0813 Last data filed at 01/11/2020 [id number] Gross per 24 hour  Intake --  Output 700 ml  Net -700 ml    PHYSICAL EXAMINATION:  GENERAL:  74 y.o.-year-old patient lying in the bed with no acute distress.  EYES: Pupils equal, round, reactive to light and accommodation. No scleral icterus.  HEENT: Head atraumatic, normocephalic. Oropharynx and nasopharynx clear.  NECK:  Supple, no jugular venous distention. No thyroid enlargement, no tenderness.  LUNGS: decreased breath  sounds bilaterally bases, no wheezing, rales,rhonchi or crepitation. No use of accessory muscles of respiration.  CARDIOVASCULAR: S1, S2 normal. No murmurs, rubs, or gallops.  ABDOMEN: Soft, non-tender, non-distended. Bowel sounds present. No organomegaly or mass.  EXTREMITIES: No pedal edema, cyanosis, or clubbing.  NEUROLOGIC: Cranial nerves II through XII are intact. Muscle strength 5/5 in all extremities. Sensation intact. Gait not checked.  PSYCHIATRIC: The patient is alert and oriented x 3.  SKIN: No obvious rash, lesion, or ulcer.   DATA REVIEW:   CBC  Recent Labs  Lab 01/10/20 1316  WBC 15.1*  HGB 13.5  HCT 43.0  PLT 291    Chemistries  Recent Labs  Lab 01/10/20 1316  NA 135  K 4.5  CL 103  CO2 21*  GLUCOSE 169*  BUN 36*  CREATININE 0.94  CALCIUM 8.2*  MG 2.0  AST 16  ALT 17  ALKPHOS 65  BILITOT 1.1    Microbiology Results   Recent Results (from the past 240 hour(s))  Respiratory Panel by RT PCR (Flu A&B, Covid) - Nasopharyngeal Swab     Status: None   Collection Time: 01/10/20  3:54 PM   Specimen: Nasopharyngeal Swab  Result Value Ref Range Status   SARS Coronavirus 2 by RT PCR NEGATIVE NEGATIVE Final    Comment: (NOTE) SARS-CoV-2 target nucleic acids are NOT DETECTED. The SARS-CoV-2 RNA is generally detectable in upper respiratoy specimens during the acute phase of infection. The lowest concentration of SARS-CoV-2 viral copies this assay can detect is 131 copies/mL. A negative result does not preclude SARS-Cov-2 infection and should not be used as the sole basis for treatment or other patient management decisions. A negative result may occur with  improper specimen collection/handling, submission of specimen other than nasopharyngeal swab, presence of viral mutation(s) within the areas targeted by this assay, and inadequate number of viral copies (<131 copies/mL). A negative result must be combined with clinical observations, patient history, and  epidemiological information. The expected result is Negative. Fact Sheet for Patients:  PinkCheek.be Fact Sheet for Healthcare Providers:  GravelBags.it This test is not yet ap proved or cleared by the [place] FDA and  has been authorized for detection and/or diagnosis of SARS-CoV-2 by FDA under an Emergency Use Authorization (EUA). This EUA will remain  in effect (meaning this test can be used) for the duration of the COVID-19 declaration under Section 564(b)(1) of the Act, 21 U.S.C. section 360bbb-3(b)(1), unless the authorization is terminated or revoked sooner.    Influenza A by PCR NEGATIVE NEGATIVE Final   Influenza B by  PCR NEGATIVE NEGATIVE Final    Comment: (NOTE) The Xpert Xpress SARS-CoV-2/FLU/RSV assay is intended as an aid in  the diagnosis of influenza from Nasopharyngeal swab specimens and  should not be used as a sole basis for treatment. Nasal washings and  aspirates are unacceptable for Xpert Xpress SARS-CoV-2/FLU/RSV  testing. Fact Sheet for Patients: PinkCheek.be Fact Sheet for Healthcare Providers: GravelBags.it This test is not yet approved or cleared by the [place] FDA and  has been authorized for detection and/or diagnosis of SARS-CoV-2 by  FDA under an Emergency Use Authorization (EUA). This EUA will remain  in effect (meaning this test can be used) for the duration of the  Covid-19 declaration under Section 564(b)(1) of the Act, 21  U.S.C. section 360bbb-3(b)(1), unless the authorization is  terminated or revoked. Performed at [facility], [address] [place], [place] [address]     RADIOLOGY:  DG Chest Portable 1 View  Result Date: 01/10/2020 CLINICAL DATA:  Shortness of breath, chest pain, symptoms since Saturday, history colon cancer, melanoma EXAM: PORTABLE CHEST 1 VIEW COMPARISON:  Portable exam 1347 hours  compared to 12/24/2019 FINDINGS: RIGHT jugular Port-A-Cath with tip projecting over SVC. Upper normal heart size. Mediastinal contours and pulmonary vascularity normal. Atherosclerotic calcification aorta. Minimal bibasilar atelectasis. Lungs otherwise clear. No infiltrate, pleural effusion or pneumothorax. Bones demineralized. IMPRESSION: Minimal bibasilar atelectasis. Electronically Signed   By: Lavonia Dana M.D.   On: 01/10/2020 13:54     CODE STATUS:     Code Status Orders  (From admission, onward)         Start     Ordered   01/10/20 1646  Full code  Continuous     01/10/20 1645        Code Status History    Date Active Date Inactive Code Status Order ID Comments User Context   12/24/2019 1509 12/25/2019 1934 DNR [id number]  Lorella Nimrod, MD ED   12/08/2019 1919 12/10/2019 1714 Full Code [id number]  Orene Desanctis, DO ED   09/01/2018 2030 09/04/2018 1649 Full Code [id number]  Tylene Fantasia, PA-C Inpatient   07/31/2018 0444 08/01/2018 1903 Full Code [id number]  Harrie Foreman, MD Inpatient   06/25/2018 2009 07/13/2018 1848 Full Code [id number]  Jules Husbands, MD Inpatient   12/01/2017 1437 12/05/2017 1955 Full Code [id number]  Nestor Lewandowsky, MD Inpatient   04/15/2017 0317 04/19/2017 1822 Full Code [id number]  Harrie Foreman, MD Inpatient   Advance Care Planning Activity    Advance Directive Documentation     Most Recent Value  Type of Advance Directive  Healthcare Power of Attorney, Living will  Pre-existing out of facility DNR order (yellow form or pink MOST form)  --  "MOST" Form in Place?  --       TOTAL TIME TAKING CARE OF THIS PATIENT: *40* minutes.    Fritzi Mandes M.D  [facility]    CC: Primary care physician; Birdie Sons, MD

## 2020-01-11 NOTE — Telephone Encounter (Signed)
LMTCB to schedule hospital follow up. Okay for PEC to schedule on date listed below.

## 2020-01-11 NOTE — Telephone Encounter (Signed)
She can have the 10am on Monday the 20th

## 2020-01-11 NOTE — Progress Notes (Signed)
Up ambulating to restroom. HR 160 in uncontrolled Afib. A/Ox4. Skin warm dry pink. No acute distress noted. Just received PO metoprolol 25mg  po at 2150. Instructed patient that she could only get oob to [facility] from this point on. Will reassess HR after rest in bed and po metoprolol time to work.

## 2020-01-11 NOTE — Progress Notes (Addendum)
Pulse ox reading 85% on RA while asleep. Pt states that she forgot to tell us that she wore oxygen at home at HS. Placed on 2l/[place].

## 2020-01-11 NOTE — Progress Notes (Signed)
Discharge instructions, all questions answered.

## 2020-01-11 NOTE — Telephone Encounter (Signed)
Copied from [place] [id number]. Topic: Appointment Scheduling - Scheduling Inquiry for Clinic >> Jan 11, 2020  8:49 AM Rainey Pines A wrote: [facility] called to schedule patient hospital follow up one week from today, no available openings until May. Please advise

## 2020-01-12 DIAGNOSIS — R062 Wheezing: Secondary | ICD-10-CM | POA: Diagnosis not present

## 2020-01-12 DIAGNOSIS — S72046A Nondisplaced fracture of base of neck of unspecified femur, initial encounter for closed fracture: Secondary | ICD-10-CM | POA: Diagnosis not present

## 2020-01-12 DIAGNOSIS — J441 Chronic obstructive pulmonary disease with (acute) exacerbation: Secondary | ICD-10-CM | POA: Diagnosis not present

## 2020-01-13 NOTE — Progress Notes (Signed)

## 2020-01-14 ENCOUNTER — Inpatient Hospital Stay: Payer: Medicare HMO

## 2020-01-14 ENCOUNTER — Other Ambulatory Visit: Payer: Self-pay

## 2020-01-14 ENCOUNTER — Inpatient Hospital Stay (HOSPITAL_BASED_OUTPATIENT_CLINIC_OR_DEPARTMENT_OTHER): Payer: Medicare HMO | Admitting: Oncology

## 2020-01-14 ENCOUNTER — Encounter: Payer: Self-pay | Admitting: Oncology

## 2020-01-14 VITALS — BP 137/82 | HR 78 | Temp 97.1°F | Resp 18 | Wt 156.9 lb

## 2020-01-14 DIAGNOSIS — C184 Malignant neoplasm of transverse colon: Secondary | ICD-10-CM

## 2020-01-14 DIAGNOSIS — K56609 Unspecified intestinal obstruction, unspecified as to partial versus complete obstruction: Secondary | ICD-10-CM | POA: Diagnosis not present

## 2020-01-14 DIAGNOSIS — D509 Iron deficiency anemia, unspecified: Secondary | ICD-10-CM | POA: Diagnosis not present

## 2020-01-14 DIAGNOSIS — M7989 Other specified soft tissue disorders: Secondary | ICD-10-CM | POA: Diagnosis not present

## 2020-01-14 DIAGNOSIS — Z5112 Encounter for antineoplastic immunotherapy: Secondary | ICD-10-CM | POA: Diagnosis not present

## 2020-01-14 DIAGNOSIS — R634 Abnormal weight loss: Secondary | ICD-10-CM | POA: Diagnosis not present

## 2020-01-14 DIAGNOSIS — G893 Neoplasm related pain (acute) (chronic): Secondary | ICD-10-CM | POA: Diagnosis not present

## 2020-01-14 DIAGNOSIS — I4891 Unspecified atrial fibrillation: Secondary | ICD-10-CM | POA: Diagnosis not present

## 2020-01-14 LAB — COMPREHENSIVE METABOLIC PANEL
ALT: 12 U/L (ref 0–44)
AST: 10 U/L — ABNORMAL LOW (ref 15–41)
Albumin: 3.2 g/dL — ABNORMAL LOW (ref 3.5–5.0)
Alkaline Phosphatase: 70 U/L (ref 38–126)
Anion gap: 8 (ref 5–15)
BUN: 18 mg/dL (ref 8–23)
CO2: 26 mmol/L (ref 22–32)
Calcium: 8.4 mg/dL — ABNORMAL LOW (ref 8.9–10.3)
Chloride: 101 mmol/L (ref 98–111)
Creatinine, Ser: 0.64 mg/dL (ref 0.44–1.00)
GFR calc Af Amer: >60 mL/min (ref >60)
GFR calc non Af Amer: >60 mL/min (ref >60)
Glucose, Bld: 127 mg/dL — ABNORMAL HIGH (ref 70–99)
Potassium: 4.6 mmol/L (ref 3.5–5.1)
Sodium: 135 mmol/L (ref 135–145)
Total Bilirubin: 0.4 mg/dL (ref 0.3–1.2)
Total Protein: 6.6 g/dL (ref 6.5–8.1)

## 2020-01-14 LAB — CBC WITH DIFFERENTIAL/PLATELET
Abs Immature Granulocytes: 0.06 10*3/uL (ref 0.00–0.07)
Basophils Absolute: 0.1 10*3/uL (ref 0.0–0.1)
Basophils Relative: 0 %
Eosinophils Absolute: 0.3 10*3/uL (ref 0.0–0.5)
Eosinophils Relative: 2 %
HCT: 39.3 % (ref 36.0–46.0)
Hemoglobin: 12.4 g/dL (ref 12.0–15.0)
Immature Granulocytes: 1 %
Lymphocytes Relative: 18 %
Lymphs Abs: 2.2 10*3/uL (ref 0.7–4.0)
MCH: 27.8 pg (ref 26.0–34.0)
MCHC: 31.6 g/dL (ref 30.0–36.0)
MCV: 88.1 fL (ref 80.0–100.0)
Monocytes Absolute: 2.1 10*3/uL — ABNORMAL HIGH (ref 0.1–1.0)
Monocytes Relative: 17 %
Neutro Abs: 7.8 10*3/uL — ABNORMAL HIGH (ref 1.7–7.7)
Neutrophils Relative %: 62 %
Platelets: 290 10*3/uL (ref 150–400)
RBC: 4.46 MIL/uL (ref 3.87–5.11)
RDW: 21.2 % — ABNORMAL HIGH (ref 11.5–15.5)
WBC: 12.5 10*3/uL — ABNORMAL HIGH (ref 4.0–10.5)
nRBC: 0 % (ref 0.0–0.2)

## 2020-01-14 LAB — MAGNESIUM: Magnesium: 1.9 mg/dL (ref 1.7–2.4)

## 2020-01-14 NOTE — Progress Notes (Signed)
Kimberly [name] Follow up  Visit:  Patient Care Team: Birdie Sons, MD as PCP - General (Family Medicine) Earlie Server, MD as Consulting Physician (Oncology) Noreene Filbert, MD as Referring Physician (Radiation Oncology) Jules Husbands, MD as Consulting Physician (General Surgery) Benedetto Goad, RN as Registered Nurse Cathi Roan, [facility] (Pharmacist) Clent Jacks, RN as Oncology Nurse Navigator  PURPOSE OF VISIT: Stage IV colon [name]  HISTORY OF PRESENTING ILLNESS: Kimberly [name] 74 y.o. female with PMH listed as below presents for follow up for the management of Stage IIIB Colon [name] Patient reports a remote history of breast [name] s/p lumpectomy and radiation treatments.   Patient had been having abdominal pain, weight loss and bloating.  CT scan showed partial obstruction at the level of transverse colon and ileocolic mesenteric adenopathy. She was prepared for a colonoscopy on 04/15/2017. She started to have worsened abdominal pain, nausea vomiting, unable to maintain oral intake and was sent to ED. She was found to have bowel obstruction and underwent  exploratory laparotomy with transverse colectomy, with end colostomy and mucous fistula formation for obstructing colon lesion. Differential diagnosis prior to surgery was metastatic breast [name] in colon vs colon [name]. The patient did have a colonoscopy 2 years ago without the mass being present at that time but did have 2 small polyps in the transverse colon that were removed.  04/15/2017 Patient underwent transverse colon resection.  Pathology showed T4aN1 moderate differentiated adenocarcinoma, Grade 2, tumor invades visceral peritoneum, LVI present, negative margin, 3/16 lymph nodes involved with [name]. Low probability of MSI-H.   # Genetic test INVITAE came back negative. No pathogenetic sequence variants or deletions/duplications identified. Results was scanned to [employer] (a copy of the report was  provided to patient and her daughter)  # # baseline CEA is 2.1 # colostomy reversal on 06/25/2018, subsequently developed C. difficile colitis x 2.  Multiple abdominal CT abdomen and pelvis for obtained, not listed here #09/01/2018 CT abdomen pelvis showed abdominal wall abscess,  percutaneous drain placement .treatment with antibiotics with Augmentin 10 days course.  #09/14/2018 CT abdomen/pelvis showed resolution of abscess, no intraabdominal abscess.  Mediport was discontinued. # 08/13/2019 colonoscopy showed localized area of mildly thickened folds of the mucosa was found in the sigmoid colon. Biopsy pathology showed benign mucosa with lymphoid aggregates and focal minimal inflammation. No dysplasia.   TREATMENT:  04/15/2017 transverse colon resection 05/27/2017- 11/17/2017 adjuvant FOLFOX, Oxaliplatin was discontinued after 3 cycles due to worsening of pre-existing neuropathy. Finished another 9 cycles of 5-FU.  March -April 2019 adjuvant RT.  06/25/2018 Colostomy Reversal.   09/21/2019 PET scan showed 2 small right abdominal mesenteric soft tissue nodule which showed no significant change in size compared to recent CT but hypermetabolic.  Peritoneal metastatic disease cannot be excluded. 1.1 cm hypermetabolic nodule in the right parotid gland suspicious for primary parotid neoplasm.  08/27/2020, CT-guided biopsy of abdominal wall mass was obtained. Results showed metastatic adenocarcinoma, compatible with colon primary.  # 12/07/2019 She underwent surgery for laparoscopic evaluation of peritoneum and placement of Mediport. # 12/08/2019- 12/10/2019  She was admitted due to atrial fibrillation.  Patient was started on anticoagulation with Eliquis 5 mg twice daily.  Patient was reverted back to normal sinus rhythm.  She is on metoprolol for rate control.  # 12/24/2019 She had anaphylactic reaction to Cetuximab She got epinephrine. Was not intubated.   INTERVAL HISTORY 74 y.o. female  presents for  follow-up of recurrent colon [name].  S/p panitumumab last week.  She also started Encorafenib 343m daily and after taking for 2 days, she called and reported symptoms including fatigue, red face, headache, and severe chest pressure. She was advised to go to ER for further evaluation. She was found to be in A fib with RVR. Chest pressure was considered to be due to demand ischemia.  She was seen by Dr. FUbaldo Glassingand metoprolol dose was increased to 526mBID.  Patient did not resume Encorafenib since discharge. She presents to discuss her options.  Denies any breathing difficulties, chest pressure    Review of Systems  Constitutional: Positive for fatigue. Negative for appetite change, chills, fever and unexpected weight change.  HENT:   Negative for hearing loss, lump/mass, nosebleeds and voice change.   Eyes: Negative for eye problems.  Respiratory: Negative for chest tightness, cough and shortness of breath.   Cardiovascular: Negative for chest pain and leg swelling.  Gastrointestinal: Negative for abdominal distention, abdominal pain, blood in stool, constipation, diarrhea and nausea.  Endocrine: Negative for hot flashes.  Genitourinary: Negative for difficulty urinating, dysuria and frequency.   Musculoskeletal: Negative for arthralgias, gait problem and myalgias.  Skin: Negative for itching, rash and wound.  Neurological: Negative for dizziness, extremity weakness, gait problem and headaches.       Chronic numbness and tingling of fingertips and toes.  Hematological: Negative for adenopathy. Does not bruise/bleed easily.  Psychiatric/Behavioral: Negative for confusion, decreased concentration and depression. The patient is not nervous/anxious.       MEDICAL HISTORY: Past Medical History:  Diagnosis Date  . Breast [name], left ([facility]   Lumpectomy and rad tx's.  . Chronic back pain   . Colon [name] ([facility]  . COPD (chronic obstructive pulmonary disease) ([facility]  . Degenerative  disc disease, lumbar 04/2013  . Family history of adverse reaction to anesthesia    son arrested after anesthesia  . GERD (gastroesophageal reflux disease)   . Iron deficiency anemia 05/27/2017  . Personal history of chemotherapy 2018   Colon  . Personal history of radiation therapy    Breast 2004  . Personal history of radiation therapy 2018   Colon  . Postprocedural intraabdominal abscess 09/01/2018  . Small bowel obstruction ([facility]/18/2018  . Wears dentures    full upper    SURGICAL HISTORY: Past Surgical History:  Procedure Laterality Date  . APPENDECTOMY  1965  . BREAST EXCISIONAL BIOPSY Left 08/01/2003   lumpectomy rad 11/04-2/28/2005  . BREAST SURGERY    . CESAREAN SECTION     x3  . COLON SURGERY    . COLONOSCOPY W/ POLYPECTOMY    . COLONOSCOPY WITH PROPOFOL N/A 04/09/2018   Procedure: COLONOSCOPY WITH PROPOFOL;  Surgeon: TaVirgel ManifoldMD;  Location: [facility] END[facility];  Service: Gastroenterology;  Laterality: N/A;  . COLONOSCOPY WITH PROPOFOL N/A 08/13/2019   Procedure: COLONOSCOPY WITH PROPOFOL;  Surgeon: TaVirgel ManifoldMD;  [place] Service: [place]oscopy;  Laterality: N/A;  . COLOSTOMY TAKEDOWN N/A 06/25/2018   Procedure: COLOSTOMY TAKEDOWN;  Surgeon: PaJules HusbandsMD;  Location: [facility] ORS;  Service: General;  Laterality: N/A;  . ESOPHAGOGASTRODUODENOSCOPY (EGD) WITH PROPOFOL N/A 04/04/2017   Procedure: ESOPHAGOGASTRODUODENOSCOPY (EGD) WITH PROPOFOL;  Surgeon: WoLucilla LameMD;  [place] Service: Endoscopy;  Laterality: N/A;  . FRACTURE SURGERY    . HIP FRACTURE SURGERY Left 01/25/2012  . LAPAROSCOPY N/A 12/07/2019   Procedure: LAPAROSCOPY DIAGNOSTIC;  Surgeon: PaJules HusbandsMD;  Location:  [place] [facility];  Service: General;  Laterality: N/A;  . LAPAROTOMY N/A 04/15/2017   Procedure: EXPLORATORY LAPAROTOMY;  Surgeon: Clayburn Pert, MD;  Location: [facility] ORS;  Service: General;  Laterality: N/A;  . NECK SURGERY  12/2011  .  PARASTOMAL HERNIA REPAIR N/A 12/03/2017   Procedure: HERNIA REPAIR PARASTOMAL;  Surgeon: Jules Husbands, MD;  Location: [facility];  Service: General;  Laterality: N/A;  . PARASTOMAL HERNIA REPAIR N/A 06/25/2018   Procedure: HERNIA REPAIR PARASTOMAL;  Surgeon: Jules Husbands, MD;  Location: [facility];  Service: General;  Laterality: N/A;  . PORTACATH PLACEMENT Right 05/21/2017   Procedure: INSERTION PORT-A-CATH;  Surgeon: Clayburn Pert, MD;  Location: [facility];  Service: General;  Laterality: Right;  . PORTACATH PLACEMENT Right 12/07/2019   Procedure: INSERTION PORT-A-CATH;  Surgeon: Jules Husbands, MD;  Location: [facility] OR[facility];  Service: General;  Laterality: Right;  . WRIST FRACTURE SURGERY      SOCIAL HISTORY: Social History   Socioeconomic History  . Marital status: Divorced    Spouse name: Not on file  . Number of children: 3  . Years of education: Not on file  . Highest education level: 7th grade  Occupational History  . Occupation: retired  Tobacco Use  . Smoking status: Former Smoker    Packs/day: 0.25    Years: 55.00    Pack years: 13.75    Types: Cigarettes    Quit date: 05/21/2017    Years since quitting: 2.6  . Smokeless tobacco: Never Used  . Tobacco comment: started age 35 1/2 to 1 ppd  Substance and Sexual Activity  . Alcohol use: Yes    Alcohol/week: 0.0 standard drinks    Comment: occasionally drinks beer  . Drug use: No  . Sexual activity: Never  Other Topics Concern  . Not on file  Social History Narrative   Lives at home alone   Social Determinants of Health   Financial Resource Strain: Medium Risk  . Difficulty of Paying Living Expenses: Somewhat hard  Food Insecurity:   . Worried About Charity fundraiser in the Last Year:   . Arboriculturist in the Last Year:   Transportation Needs:   . Film/video editor (Medical):   Marland Kitchen Lack of Transportation (Non-Medical):   Physical Activity: Inactive  . Days of Exercise per Week: 0 days  . Minutes of Exercise  per Session: 0 min  Stress:   . Feeling of Stress :   Social Connections: Unknown  . Frequency of Communication with Friends and Family: Patient refused  . Frequency of Social Gatherings with Friends and Family: Patient refused  . Attends Religious Services: Patient refused  . Active Member of Clubs or Organizations: Patient refused  . Attends Archivist Meetings: Patient refused  . Marital Status: Patient refused  Intimate Partner Violence: Unknown  . Fear of Current or Ex-Partner: Patient refused  . Emotionally Abused: Patient refused  . Physically Abused: Patient refused  . Sexually Abused: Patient refused    FAMILY HISTORY Family History  Problem Relation Age of Onset  . Diabetes Mother        type 2  . Coronary artery disease Mother   . Deep vein thrombosis Mother   . Colon [name] Father   . Asthma Brother   . Diabetes Brother        type 2  . Breast [name] Neg Hx     ALLERGIES:  is allergic to erbitux [cetuximab]; betadine [povidone iodine]; iodine;  other; and sinus formula [cholestatin].  MEDICATIONS:  Current Outpatient Medications  Medication Sig Dispense Refill  . apixaban (ELIQUIS) 5 MG TABS tablet Take 1 tablet (5 mg total) by mouth 2 (two) times daily. 60 tablet 0  . budesonide-formoterol (SYMBICORT) 80-4.5 MCG/ACT inhaler Inhale 2 puffs into the lungs 2 (two) times daily. 3 Inhaler 3  . calcium citrate-vitamin D (CITRACAL+D) 315-200 MG-UNIT tablet Take 1 tablet by mouth daily.    . Cholecalciferol (VITAMIN D3) 50 MCG (2000 UT) TABS Take 2,000 Units by mouth daily.    . diphenhydrAMINE (BENADRYL) 25 MG tablet Take 2 tablets (50 mg total) by mouth at bedtime as needed for itching or allergies. 30 tablet 0  . EPINEPHrine 0.3 mg/0.3 mL IJ SOAJ injection Inject 0.3 mLs (0.3 mg total) into the muscle as needed for anaphylaxis. 1 each 0  . gabapentin (NEURONTIN) 400 MG capsule TAKE 1 CAPSULE THREE TIMES DAILY (Patient taking differently: Take 400 mg by mouth  2 (two) times daily. ) 270 capsule 1  . HYDROcodone-acetaminophen (NORCO/VICODIN) 5-325 MG tablet Take 1-2 tablets by mouth every 6 (six) hours as needed for moderate pain. 20 tablet 0  . meloxicam (MOBIC) 15 MG tablet TAKE 1 TABLET EVERY DAY AS NEEDED (Patient taking differently: Take 15 mg by mouth at bedtime. ) 90 tablet 3  . metoprolol tartrate (LOPRESSOR) 50 MG tablet Take 1 tablet (50 mg total) by mouth 2 (two) times daily. 60 tablet 0  . Multiple Vitamins-Minerals (WOMENS MULTI VITAMIN & MINERAL PO) Take 2 tablets by mouth daily.     Marland Kitchen omeprazole (PRILOSEC) 20 MG capsule Take 1 capsule (20 mg total) by mouth daily. 90 capsule 4  . pravastatin (PRAVACHOL) 40 MG tablet TAKE 1 TABLET EVERY DAY (Patient taking differently: Take 40 mg by mouth daily. ) 90 tablet 4  . Probiotic Product (PROBIOTIC DAILY PO) Take 1 capsule by mouth daily.     Marland Kitchen senna-docusate (SENOKOT-S) 8.6-50 MG tablet Take 2 tablets by mouth daily. 60 tablet 1   No current facility-administered medications for this visit.    PHYSICAL EXAMINATION:  ECOG PERFORMANCE STATUS: 1 - Symptomatic but completely ambulatory  Vitals:   01/14/20 1537  BP: 137/82  Pulse: 78  Resp: 18  Temp: (!) 97.1 F (36.2 C)   Filed Weights   01/14/20 1537  Weight: 156 lb 14.4 oz (71.2 kg)    Physical Exam  Constitutional: She is oriented to person, place, and time. No distress.  HENT:  Head: Normocephalic and atraumatic.  Nose: Nose normal.  Mouth/Throat: Oropharynx is clear and moist. No oropharyngeal exudate.  Eyes: Pupils are equal, round, and reactive to light. EOM are normal. No scleral icterus.  Cardiovascular: Normal rate and regular rhythm.  No murmur heard. Pulmonary/Chest: Effort normal. No respiratory distress. She has no rales. She exhibits no tenderness.  Abdominal: Soft. She exhibits no distension. There is no abdominal tenderness.  Palpable abdominal wall mass,    Musculoskeletal:        General: No edema. Normal  range of motion.     Cervical back: Normal range of motion and neck supple.  Neurological: She is alert and oriented to person, place, and time.  Skin: Skin is warm and dry. She is not diaphoretic. No erythema.  Left chest wall Mediport  Psychiatric: Affect normal.      LABORATORY DATA: I have personally reviewed the data as listed: CBC Latest Ref Rng & Units 01/14/2020 01/10/2020 01/07/2020  WBC 4.0 - 10.5 K/uL 12.5(H)  15.1(H) 17.1(H)  Hemoglobin 12.0 - 15.0 g/dL 12.4 13.5 12.5  Hematocrit 36.0 - 46.0 % 39.3 43.0 38.9  Platelets 150 - 400 K/uL 290 291 344   CMP Latest Ref Rng & Units 01/14/2020 01/10/2020 01/07/2020  Glucose 70 - 99 mg/dL 127(H) 169(H) 199(H)  BUN 8 - 23 mg/dL 18 36(H) 32(H)  Creatinine 0.44 - 1.00 mg/dL 0.64 0.94 0.73  Sodium 135 - 145 mmol/L 135 135 133(L)  Potassium 3.5 - 5.1 mmol/L 4.6 4.5 4.0  Chloride 98 - 111 mmol/L 101 103 99  CO2 22 - 32 mmol/L 26 21(L) 23  Calcium 8.9 - 10.3 mg/dL 8.4(L) 8.2(L) 8.8(L)  Total Protein 6.5 - 8.1 g/dL 6.6 6.6 7.1  Total Bilirubin 0.3 - 1.2 mg/dL 0.4 1.1 0.5  Alkaline Phos 38 - 126 U/L 70 65 59  AST 15 - 41 U/L 10(L) 16 15  ALT 0 - 44 U/L _0 RADIOGRAPHIC STUDIES: I have personally reviewed the radiological images as listed and agreed with the findings in the report. CT ABDOMEN PELVIS WO CONTRAST  Addendum Date: 12/08/2019   ADDENDUM REPORT: 12/08/2019 16:58 ADDENDUM: Upon further review, 11 mm irregular nodular density is seen adjacent to the colonic surgical anastomosis, but may be slightly enlarged compared to prior exam. There is also noted 18 mm nodular density in anterior abdominal wall centrally which may be slightly enlarged compared to prior exam. Etiology of these is unknown, but possible malignancy or metastatic disease cannot be excluded. PET scan is recommended for further evaluation. Bilateral adrenal masses are noted most consistent with adenomas. Mild pneumoperitoneum is noted consistent with recent surgery.  Aortic Atherosclerosis (ICD10-I70.0). Electronically Signed   By: Marijo Conception M.D.   On: 12/08/2019 16:58   Result Date: 12/08/2019 CLINICAL DATA:  Acute generalized abdominal pain. EXAM: CT ABDOMEN AND PELVIS WITHOUT CONTRAST TECHNIQUE: Multidetector CT imaging of the abdomen and pelvis was performed following the standard protocol without IV contrast. COMPARISON:  June 21, 2019. FINDINGS: Lower chest: Minimal right posterior basilar subsegmental atelectasis is noted. Hepatobiliary: No focal liver abnormality is seen. No gallstones, gallbladder wall thickening, or biliary dilatation. Pancreas: Unremarkable. No pancreatic ductal dilatation or surrounding inflammatory changes. Spleen: Normal in size without focal abnormality. Adrenals/Urinary Tract: Stable bilateral adrenal masses are noted most consistent with adenomas. No hydronephrosis or renal obstruction is noted. No renal or ureteral calculi are noted. Urinary bladder is unremarkable. Stomach/Bowel: Status post right hemicolectomy. The stomach is unremarkable. There is no evidence of bowel obstruction or inflammation. Vascular/Lymphatic: Aortic atherosclerosis. No enlarged abdominal or pelvic lymph nodes. Reproductive: Uterus and bilateral adnexa are unremarkable. Other: Mild pneumoperitoneum is noted consistent with recent surgery. No ascites is noted. Musculoskeletal: No acute or significant osseous findings. Electronically Signed: By: Marijo Conception M.D. On: 12/08/2019 13:14   NM Pulmonary Perf and Vent  Result Date: 12/09/2019 CLINICAL DATA:  Positive D-dimer study. History of breast and colon carcinoma. COPD EXAM: NUCLEAR MEDICINE VENTILATION - PERFUSION LUNG SCAN VIEWS: Anterior, posterior, left lateral, right lateral, RPO, RAO, LPO, LAO-ventilation and perfusion RADIOPHARMACEUTICALS:  30.52 mCi of Tc-63mDTPA aerosol inhalation and 3.86 mCi Tc963mAA IV COMPARISON:  Chest radiograph December 08, 2019 FINDINGS: Ventilation: Radiotracer uptake  on the ventilation study is homogeneous and symmetric bilaterally. No appreciable ventilation defects. Perfusion: Radiotracer uptake on the perfusion study is homogeneous and symmetric bilaterally. No appreciable perfusion defects. IMPRESSION: No appreciable ventilation or perfusion defects. Very low probability of pulmonary embolus. Electronically Signed  By: Lowella Grip III M.D.   On: 12/09/2019 14:14   DG Chest Portable 1 View  Result Date: 01/10/2020 CLINICAL DATA:  Shortness of breath, chest pain, symptoms since Saturday, history colon [name], melanoma EXAM: PORTABLE CHEST 1 VIEW COMPARISON:  Portable exam 1347 hours compared to 12/24/2019 FINDINGS: RIGHT jugular Port-A-Cath with tip projecting over SVC. Upper normal heart size. Mediastinal contours and pulmonary vascularity normal. Atherosclerotic calcification aorta. Minimal bibasilar atelectasis. Lungs otherwise clear. No infiltrate, pleural effusion or pneumothorax. Bones demineralized. IMPRESSION: Minimal bibasilar atelectasis. Electronically Signed   By: Lavonia Dana M.D.   On: 01/10/2020 13:54   DG Chest Portable 1 View  Result Date: 12/24/2019 CLINICAL DATA:  Shortness of breath and urticaria EXAM: PORTABLE CHEST 1 VIEW COMPARISON:  December 08, 2019 FINDINGS: Port-A-Cath tip is in the superior vena cava. No pneumothorax. The lungs are clear. Heart is upper normal in size with pulmonary vascularity normal. No adenopathy. There is aortic atherosclerosis. No bone lesions. IMPRESSION: Stable Port-A-Cath position. No pneumothorax. No edema or airspace opacity. Stable cardiac silhouette. No adenopathy. Aortic Atherosclerosis (ICD10-I70.0). Electronically Signed   By: Lowella Grip III M.D.   On: 12/24/2019 11:45   DG Chest Port 1 View  Result Date: 12/08/2019 CLINICAL DATA:  Left lower quadrant abdominal pain. Surgical procedure yesterday. Recent port placement. EXAM: PORTABLE CHEST 1 VIEW COMPARISON:  CT same day. Chest radiography  yesterday. FINDINGS: Power port inserted from a right internal jugular approach. Tip at the SVC RA junction. No pneumothorax. Mild bibasilar atelectasis. Small amount of pneumoperitoneum as shown on the previous CT consistent with yesterday's procedure. IMPRESSION: Mild bibasilar atelectasis. Power port tip at the SVC RA junction. No pneumothorax. Small amount of pneumoperitoneum as shown on the previous CT. Electronically Signed   By: Nelson Chimes M.D.   On: 12/08/2019 16:48   DG CHEST PORT 1 VIEW  Result Date: 12/07/2019 CLINICAL DATA:  Status post Port-A-Cath placement. EXAM: PORTABLE CHEST 1 VIEW COMPARISON:  October 05, 2019. FINDINGS: Stable cardiomediastinal silhouette. No pneumothorax pleural effusion is noted. Mild bibasilar subsegmental atelectasis or scarring is noted. Interval placement of right internal jugular Port-A-Cath with distal tip in expected position of SVC. Atherosclerosis of thoracic aorta is noted. Bony thorax is unremarkable. IMPRESSION: Interval placement of right internal jugular Port-A-Cath with distal tip in expected position of the SVC. No pneumothorax is noted. Electronically Signed   By: Marijo Conception M.D.   On: 12/07/2019 10:28   DG C-Arm 1-60 Min-No Report  Result Date: 12/07/2019 Fluoroscopy was utilized by the requesting physician.  No radiographic interpretation.   ECHOCARDIOGRAM COMPLETE  Result Date: 12/09/2019    ECHOCARDIOGRAM REPORT   Patient Name:   MERRIDY PASCOE [facility] Date of Exam: 12/09/2019 Medical Rec #:  [id number]     Height:       64.0 in Accession #:    [id number]    Weight:       158.8 lb Date of Birth:  16-Jun-1946     BSA:          1.774 m Patient Age:    64 years      BP:           142/78 mmHg Patient Gender: F             HR:           62 bpm. Exam Location:  [facility] Procedure: 2D Echo, Cardiac Doppler and Color Doppler Indications:    Atrial Fibrillation 427.31  History:  Patient has no prior history of Echocardiogram examinations.                 COPD.   Sonographer:    Sherrie Sport RDCS (AE) Referring Phys: [id number] [facility] Diagnosing      Serafina Royals MD Phys: IMPRESSIONS  1. Left ventricular ejection fraction, by estimation, is 50 to 55%. The left ventricle has low normal function. The left ventricle has no regional wall motion abnormalities. Left ventricular diastolic parameters were normal.  2. Right ventricular systolic function is normal. The right ventricular size is normal.  3. The mitral valve is normal in structure. Trivial mitral valve regurgitation.  4. The aortic valve is normal in structure. Aortic valve regurgitation is not visualized. FINDINGS  Left Ventricle: Left ventricular ejection fraction, by estimation, is 50 to 55%. The left ventricle has low normal function. The left ventricle has no regional wall motion abnormalities. The left ventricular internal cavity size was normal in size. There is no left ventricular hypertrophy. Left ventricular diastolic parameters were normal. Right Ventricle: The right ventricular size is normal. No increase in right ventricular wall thickness. Right ventricular systolic function is normal. Left Atrium: Left atrial size was normal in size. Right Atrium: Right atrial size was normal in size. Pericardium: There is no evidence of pericardial effusion. Mitral Valve: The mitral valve is normal in structure. Trivial mitral valve regurgitation. Tricuspid Valve: The tricuspid valve is normal in structure. Tricuspid valve regurgitation is trivial. Aortic Valve: The aortic valve is normal in structure. Aortic valve regurgitation is not visualized. Aortic valve mean gradient measures 3.5 mmHg. Aortic valve peak gradient measures 6.0 mmHg. Aortic valve area, by VTI measures 2.29 cm. Pulmonic Valve: The pulmonic valve was normal in structure. Pulmonic valve regurgitation is not visualized. Aorta: The aortic root and ascending aorta are structurally normal, with no evidence of dilitation. IAS/Shunts: No atrial level shunt  detected by color flow Doppler.  LEFT VENTRICLE PLAX 2D LVIDd:         4.08 cm  Diastology LVIDs:         2.85 cm  LV e' lateral:   9.36 cm/s LV PW:         1.18 cm  LV E/e' lateral: 11.5 LV IVS:        0.92 cm  LV e' medial:    8.81 cm/s LVOT diam:     2.00 cm  LV E/e' medial:  12.3 LV SV:         56 LV SV Index:   32 LVOT Area:     3.14 cm  RIGHT VENTRICLE RV Basal diam:  4.02 cm LEFT ATRIUM             Index       RIGHT ATRIUM           Index LA diam:        4.40 cm 2.48 cm/m  RA Area:     21.80 cm LA Vol (A2C):   81.2 ml 45.78 ml/m RA Volume:   68.60 ml  38.68 ml/m LA Vol (A4C):   71.0 ml 40.03 ml/m LA Biplane Vol: 75.0 ml 42.29 ml/m  AORTIC VALVE                   PULMONIC VALVE AV Area (Vmax):    2.21 cm    PV Vmax:        0.78 m/s AV Area (Vmean):   2.10 cm    PV  Peak grad:   2.4 mmHg AV Area (VTI):     2.29 cm    RVOT Peak grad: 3 mmHg AV Vmax:           122.00 cm/s AV Vmean:          83.250 cm/s AV VTI:            0.244 m AV Peak Grad:      6.0 mmHg AV Mean Grad:      3.5 mmHg LVOT Vmax:         85.70 cm/s LVOT Vmean:        55.700 cm/s LVOT VTI:          0.178 m LVOT/AV VTI ratio: 0.73  AORTA Ao Root diam: 2.60 cm MITRAL VALVE MV Area (PHT): 3.37 cm     SHUNTS MV Decel Time: 225 msec     Systemic VTI:  0.18 m MV E velocity: 108.00 cm/s  Systemic Diam: 2.00 cm MV A velocity: 62.10 cm/s MV E/A ratio:  1.74 Serafina Royals MD Electronically signed by Serafina Royals MD Signature Date/Time: 12/09/2019/1:47:20 PM    Final    [place] CORE BIOPSY (SOFT TISSUE)  Result Date: 10/28/2019 CLINICAL DATA:  History of colon carcinoma and status post transverse colectomy and prior treatment with chemotherapy and radiation therapy. Recent imaging has demonstrated new omental, peritoneal and abdominal wall soft tissue nodules which are hypermetabolic on PET scan and suspicious for recurrent disease. EXAM: ULTRASOUND GUIDED CORE BIOPSY OF ABDOMINAL WALL MASS MEDICATIONS: 1.0 mg IV Versed; 50 mcg IV Fentanyl Total  Moderate Sedation Time: 12 minutes. The patient's level of consciousness and physiologic status were continuously monitored during the procedure by Radiology nursing. PROCEDURE: The procedure, risks, benefits, and alternatives were explained to the patient. Questions regarding the procedure were encouraged and answered. The patient understands and consents to the procedure. A time out was performed prior to initiating the procedure. Ultrasound was performed of the abdominal wall and anterior peritoneal cavity. The anterior abdominal wall was prepped with chlorhexidine in a sterile fashion, and a sterile drape was applied covering the operative field. A sterile gown and sterile gloves were used for the procedure. Local anesthesia was provided with 1% Lidocaine. Core biopsy of an anterior abdominal wall mass was performed with an 18 gauge core biopsy device. Four separate core biopsy samples were obtained and submitted in formalin. COMPLICATIONS: None. FINDINGS: Irregular lobulated hypoechoic soft tissue mass is seen involving the right lower abdominal wall and corresponding to one of the lesions that demonstrated increased metabolic activity by PET scan. This area measures approximately 3.2 x 2.1 x 2.6 cm by ultrasound. Solid tissue was obtained with biopsy. IMPRESSION: Ultrasound-guided core biopsy performed of a right lower abdominal wall mass measuring 3 cm in estimated maximal diameter. Electronically Signed   By: Aletta Edouard M.D.   On: 10/28/2019 12:02      ASSESSMENT/PLAN 74yo female who has recurrent Colon [name] presents for follow up  [name] Staging Malignant neoplasm of colon [facility]) Staging form: Colon and Rectum, AJCC 8th Edition - Clinical stage from 04/15/2017: Stage IIIB (cT4a, cN1b, cM0) - Signed by Earlie Server, MD on 04/26/2017  1. Malignant neoplasm of transverse colon ([place])    #Recurrent colon [name] with abdominal wall and mesentery nodule.    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 Labs are reviewed and discussed with patient. s/p cycle 1 panitumumab.  Encorafenib was interruped due to Atrial fibrillation I had a lenghty discussion with  patient and also with her daughter over the phone that encorafenib has <1% chance causing prolonged QTc. She has a history of A fib, and maybe precipitated by recent use of steroids. I discussed with her cardiologist Dr. Ubaldo Glassing who also agrees that A Fib is not less likely related to Encorafenib and ok to re-challenge.   I recommend patient to try a lower dose of Encorafenib 157m daily and see if she is able to tolerate.  Patient is hesitant and wishes to discuss with her daughter at home.   # History of breast [name], need mammogram in March 2021 # Parotid nodule with hypermetabolism.  Her treatment for colon [name] takes priority.  Once colon [name] is stabilized, will refer her to ENT.   # Leukocytosis, intermittent monocytosis, she declined bone marrow biopsy on 05/22/2018.  Continue to monitor. Treatments of colon [name] takes priority at this point.   Follow up in 1 week.  We spent sufficient time to discuss many aspect of care, questions were answered to patient's satisfaction. A total of 25 minutes was spent on this visit.  With 20 minutes counseling the patient on the chemotherapy treatments, side effects of the treatment, management of symptoms.  Additional 5 minutes was spent on answering patient and daugher's questions.  ZEarlie Server MD, PhD Hematology Oncology CAntelope Memorial Hospitalat AThe Mackool Eye Institute LLCPager- [phone]/16/21

## 2020-01-17 NOTE — Progress Notes (Signed)
I,Roshena L Chambers,acting as a scribe for Lelon Huh, MD.,have documented all relevant documentation on the behalf of Lelon Huh, MD,as directed by  Lelon Huh, MD while in the presence of Lelon Huh, MD.   Established patient visit    Patient: Kimberly [name]   DOB: 07-23-1946   74 y.o. Female  MRN: [id number] Visit Date: 01/18/2020  Today's healthcare provider: Lelon Huh, MD   Chief Complaint  Patient presents with  . Hospitalization Follow-up  . Hyperlipidemia   Subjective    HPI  Follow up Hospitalization  Patient was [facility] to [facility] on 01/10/2020 and discharged on 01/11/2020. She was treated for Atrial Fibrillation and Metastatic neoplasm of the colon. Treatment for this included increasing Metoprolol to 50 mg bid. Ecorafenib was dicontinued Telephone follow up was not done. She reports good compliance with treatment. She reports this condition is stayed the same. Patient states her Oncologist restarted her on the Ecorafenib, and her symptom of headache and rapid heart rate has returned.   ----------------------------------------------------------------------------------------- -   Lipid/Cholesterol, Follow-up  Last Lipid Panel: Lab Results  Component Value Date   CHOL 155 11/03/2019   LDLCALC 76 11/03/2019   HDL 43 11/03/2019   TRIG 216 (H) 11/03/2019   ALT 12 01/14/2020   AST 10 (L) 01/14/2020   PLT 290 01/14/2020    She was last seen for this 2 months ago.  Management since that visit includes no change.  She reports good compliance with treatment. She is not having side effects.  Symptoms: No chest pain No chest pressure/discomfort No dyspnea No lower extremity edema No numbness or tingling of extremity No orthopnea No palpitations No paroxysmal nocturnal dyspnea No speech difficulty No syncope  Current diet: well balanced Current exercise: none  Wt Readings from Last 3 Encounters:  01/18/20 157 lb (71.2 kg)  01/14/20  156 lb 14.4 oz (71.2 kg)  01/11/20 153 lb 4.8 oz (69.5 kg)   The 10-year ASCVD risk score Mikey Bussing DC Jr., et al., 2013) is: 8.4%  -----------------------------------------------------------------------------------------   Follow up for Vitamin D Deficiency:  The patient was last seen for this 2 months ago. Changes made at last visit include patient advised to take Vitamin D3 2000 IU daily. She feels that condition is stable. She is not having side effects.   ------------------------------------------------------------------------------------    Past Medical History:  Diagnosis Date  . Breast cancer, left ([place]) 2004   Lumpectomy and rad tx's.  . Chronic back pain   . Colon cancer ([place]) 2018  . COPD (chronic obstructive pulmonary disease) ([place])   . Degenerative disc disease, lumbar 04/2013  . Family history of adverse reaction to anesthesia    son arrested after anesthesia  . GERD (gastroesophageal reflux disease)   . Hypertension   . Iron deficiency anemia 05/27/2017  . Personal history of chemotherapy 2018   Colon  . Personal history of radiation therapy    Breast 2004  . Personal history of radiation therapy 2018   Colon  . Postprocedural intraabdominal abscess 09/01/2018  . Small bowel obstruction ([place]) 04/16/2017  . Wears dentures    full upper       Medications: Outpatient Medications Prior to Visit  Medication Sig  . apixaban (ELIQUIS) 5 MG TABS tablet Take 1 tablet (5 mg total) by mouth 2 (two) times daily.  . budesonide-formoterol (SYMBICORT) 80-4.5 MCG/ACT inhaler Inhale 2 puffs into the lungs 2 (two) times daily.  . calcium citrate-vitamin D (CITRACAL+D) 315-200 MG-UNIT tablet Take  1 tablet by mouth daily.  . Cholecalciferol (VITAMIN D3) 50 MCG (2000 UT) TABS Take 2,000 Units by mouth daily.  . diphenhydrAMINE (BENADRYL) 25 MG tablet Take 2 tablets (50 mg total) by mouth at bedtime as needed for itching or allergies.  Marland Kitchen encorafenib (BRAFTOVI) 75 MG capsule  Take 150 mg by mouth daily.  Marland Kitchen EPINEPHrine 0.3 mg/0.3 mL IJ SOAJ injection Inject 0.3 mLs (0.3 mg total) into the muscle as needed for anaphylaxis.  Marland Kitchen gabapentin (NEURONTIN) 400 MG capsule TAKE 1 CAPSULE THREE TIMES DAILY (Patient taking differently: Take 400 mg by mouth 2 (two) times daily. )  . HYDROcodone-acetaminophen (NORCO/VICODIN) 5-325 MG tablet Take 1-2 tablets by mouth every 6 (six) hours as needed for moderate pain.  . meloxicam (MOBIC) 15 MG tablet TAKE 1 TABLET EVERY DAY AS NEEDED (Patient taking differently: Take 15 mg by mouth at bedtime. )  . metoprolol tartrate (LOPRESSOR) 50 MG tablet Take 1 tablet (50 mg total) by mouth 2 (two) times daily.  . Multiple Vitamins-Minerals (WOMENS MULTI VITAMIN & MINERAL PO) Take 2 tablets by mouth daily.   Marland Kitchen omeprazole (PRILOSEC) 20 MG capsule Take 1 capsule (20 mg total) by mouth daily.  . pravastatin (PRAVACHOL) 40 MG tablet TAKE 1 TABLET EVERY DAY (Patient taking differently: Take 40 mg by mouth daily. )  . Probiotic Product (PROBIOTIC DAILY PO) Take 1 capsule by mouth daily.   Marland Kitchen senna-docusate (SENOKOT-S) 8.6-50 MG tablet Take 2 tablets by mouth daily.   No facility-administered medications prior to visit.    Review of Systems  Constitutional: Negative.   Respiratory: Negative.   Cardiovascular:       Rapid heart rate  Musculoskeletal: Negative.   Neurological: Positive for headaches.  Hematological: Negative.         Objective    BP (!) 94/54 (BP Location: Right Arm, Cuff Size: Normal)   Pulse 77   Temp (!) 96.7 F (35.9 C) (Temporal)   Resp 18   Wt 157 lb (71.2 kg)   SpO2 91% Comment: room air  BMI 26.95 kg/m     Physical Exam   General: Appearance:     Well developed, well nourished female in no acute distress  Eyes:    PERRL, conjunctiva/corneas clear, EOM's intact       Lungs:     Clear to auscultation bilaterally, respirations unlabored  Heart:    Normal heart rate. Regularly irregular rhythm. No murmurs, rubs,  or gallops.   MS:   All extremities are intact.   Neurologic:   Awake, alert, oriented x 3. No apparent focal neurological           defect.         No results found for any visits on 01/18/20.   Assessment & Plan    1. DDD (degenerative disc disease), lumbar refill- HYDROcodone-acetaminophen (NORCO) 7.5-325 MG tablet; Take 1-2 tablets by mouth every 6 (six) hours as needed for moderate pain.  Dispense: 120 tablet; Refill: 0  2. Atrial fibrillation with rapid ventricular response (HCC) refill - apixaban (ELIQUIS) 5 MG TABS tablet; Take 1 tablet (5 mg total) by mouth 2 (two) times daily.  Dispense: 60 tablet; Refill: 3    No follow-ups on file.      The entirety of the information documented in the History of Present Illness, Review of Systems and Physical Exam were personally obtained by me. Portions of this information were initially documented by the CMA and reviewed by me for thoroughness and  accuracy.      Lelon Huh, MD  [facility] [phone] (phone) [phone] (fax)  [place]

## 2020-01-18 ENCOUNTER — Ambulatory Visit (INDEPENDENT_AMBULATORY_CARE_PROVIDER_SITE_OTHER): Payer: Medicare HMO | Admitting: Family Medicine

## 2020-01-18 ENCOUNTER — Other Ambulatory Visit: Payer: Self-pay

## 2020-01-18 ENCOUNTER — Encounter: Payer: Self-pay | Admitting: Family Medicine

## 2020-01-18 VITALS — BP 94/54 | HR 77 | Temp 96.7°F | Resp 18 | Wt 157.0 lb

## 2020-01-18 DIAGNOSIS — M5136 Other intervertebral disc degeneration, lumbar region: Secondary | ICD-10-CM

## 2020-01-18 DIAGNOSIS — I4891 Unspecified atrial fibrillation: Secondary | ICD-10-CM

## 2020-01-18 MED ORDER — HYDROCODONE-ACETAMINOPHEN 7.5-325 MG PO TABS: 1.0000 | ORAL_TABLET | Freq: Four times a day (QID) | ORAL | 0 refills | Status: DC | PRN

## 2020-01-18 MED ORDER — APIXABAN 5 MG PO TABS: 5.0000 mg | ORAL_TABLET | Freq: Two times a day (BID) | ORAL | 3 refills | Status: DC

## 2020-01-18 MED ORDER — METOPROLOL SUCCINATE ER 100 MG PO TB24: 100.0000 mg | ORAL_TABLET | Freq: Every day | ORAL | 3 refills | Status: DC

## 2020-01-18 NOTE — Progress Notes (Signed)
Subjective:   Kimberly [name] is a 74 y.o. female who presents for Medicare Annual (Subsequent) preventive examination.    This visit is being conducted through telemedicine due to the COVID-19 pandemic. This patient has given me verbal consent via doximity to conduct this visit, patient states they are participating from their home address. Some vital signs may be absent or patient reported.    Patient identification: identified by name, DOB, and current address  Review of Systems:  N/A  Cardiac Risk Factors include: advanced age (>5men, >55 women);hypertension     Objective:     Vitals: There were no vitals taken for this visit.  There is no height or weight on file to calculate BMI. Unable to obtain vitals due to visit being conducted via telephonically.   Advanced Directives 01/19/2020 01/10/2020 12/31/2019 12/31/2019 12/24/2019 12/24/2019 12/14/2019  Does Patient Have a Medical Advance Directive? Yes Yes Yes No No Yes Yes  Type of Paramedic of [place];Living will [place];Living will Living will;Healthcare Power of [place]  Does patient want to make changes to medical advance directive? - No - Patient declined - - No - Patient declined - -  Copy of [place] in Chart? Yes - validated most recent copy scanned in chart (See row information) Yes - validated most recent copy scanned in chart (See row information) Yes - validated most recent copy scanned in chart (See row information) - - - -  Would patient like information on creating a medical advance directive? No - Patient declined No - Patient declined - - No - Patient declined - -  Some encounter information is confidential and restricted. Go to Review Flowsheets activity to see all data.    Tobacco Social History   Tobacco Use  Smoking Status Former Smoker  . Packs/day: 0.25  . Years: 55.00  . Pack  years: 13.75  . Types: Cigarettes  . Quit date: 05/21/2017  . Years since quitting: 2.6  Smokeless Tobacco Never Used  Tobacco Comment   started age 39 1/2 to 1 ppd     Counseling given: Not Answered Comment: started age 79 1/2 to 1 ppd   Clinical Intake:  Pre-visit preparation completed: Yes  Pain : No/denies pain Pain Score: 0-No pain     Nutritional Risks: None Diabetes: No  How often do you need to have someone help you when you read instructions, pamphlets, or other written materials from your doctor or pharmacy?: 1 - Never  Interpreter Needed?: No  Information entered by :: [facility], LPN  Past Medical History:  Diagnosis Date  . Breast cancer, left ([place]) 2004   Lumpectomy and rad tx's.  . Chronic back pain   . Colon cancer ([place]) 2018  . COPD (chronic obstructive pulmonary disease) ([place])   . Degenerative disc disease, lumbar 04/2013  . Family history of adverse reaction to anesthesia    son arrested after anesthesia  . GERD (gastroesophageal reflux disease)   . Hypertension   . Iron deficiency anemia 05/27/2017  . Personal history of chemotherapy 2018   Colon  . Personal history of radiation therapy    Breast 2004  . Personal history of radiation therapy 2018   Colon  . Postprocedural intraabdominal abscess 09/01/2018  . Small bowel obstruction ([place]) 04/16/2017  . Wears dentures    full upper   Past Surgical History:  Procedure Laterality Date  . APPENDECTOMY  1965  . BREAST EXCISIONAL BIOPSY Left 08/01/2003   lumpectomy rad 11/04-2/28/2005  . BREAST SURGERY    . CESAREAN SECTION     x3  . COLON SURGERY    . COLONOSCOPY W/ POLYPECTOMY    . COLONOSCOPY WITH PROPOFOL N/A 04/09/2018   Procedure: COLONOSCOPY WITH PROPOFOL;  Surgeon: Virgel Manifold, MD;  Location: [facility];  Service: Gastroenterology;  Laterality: N/A;  . COLONOSCOPY WITH PROPOFOL N/A 08/13/2019   Procedure: COLONOSCOPY WITH PROPOFOL;  Surgeon: Virgel Manifold, MD;   Location: [place];  Service: Endoscopy;  Laterality: N/A;  . COLOSTOMY TAKEDOWN N/A 06/25/2018   Procedure: COLOSTOMY TAKEDOWN;  Surgeon: Jules Husbands, MD;  Location: [facility];  Service: General;  Laterality: N/A;  . ESOPHAGOGASTRODUODENOSCOPY (EGD) WITH PROPOFOL N/A 04/04/2017   Procedure: ESOPHAGOGASTRODUODENOSCOPY (EGD) WITH PROPOFOL;  Surgeon: [name], MD;  Location: [place];  Service: Endoscopy;  Laterality: N/A;  . FRACTURE SURGERY    . HIP FRACTURE SURGERY Left 01/25/2012  . LAPAROSCOPY N/A 12/07/2019   Procedure: LAPAROSCOPY DIAGNOSTIC;  Surgeon: Jules Husbands, MD;  Location: [facility];  Service: General;  Laterality: N/A;  . LAPAROTOMY N/A 04/15/2017   Procedure: EXPLORATORY LAPAROTOMY;  Surgeon: Clayburn Pert, MD;  Location: [facility];  Service: General;  Laterality: N/A;  . NECK SURGERY  12/2011  . PARASTOMAL HERNIA REPAIR N/A 12/03/2017   Procedure: HERNIA REPAIR PARASTOMAL;  Surgeon: Jules Husbands, MD;  Location: [facility];  Service: General;  Laterality: N/A;  . PARASTOMAL HERNIA REPAIR N/A 06/25/2018   Procedure: HERNIA REPAIR PARASTOMAL;  Surgeon: Jules Husbands, MD;  Location: [facility];  Service: General;  Laterality: N/A;  . PORTACATH PLACEMENT Right 05/21/2017   Procedure: INSERTION PORT-A-CATH;  Surgeon: Clayburn Pert, MD;  Location: [facility];  Service: General;  Laterality: Right;  . PORTACATH PLACEMENT Right 12/07/2019   Procedure: INSERTION PORT-A-CATH;  Surgeon: Jules Husbands, MD;  Location: [facility];  Service: General;  Laterality: Right;  . WRIST FRACTURE SURGERY     Family History  Problem Relation Age of Onset  . Diabetes Mother        type 2  . Coronary artery disease Mother   . Deep vein thrombosis Mother   . Colon cancer Father   . Asthma Brother   . Diabetes Brother        type 2  . Breast cancer Neg Hx    Social History   Socioeconomic History  . Marital status: Divorced    Spouse name: Not on file  . Number of children:  3  . Years of education: Not on file  . Highest education level: 7th grade  Occupational History  . Occupation: retired  Tobacco Use  . Smoking status: Former Smoker    Packs/day: 0.25    Years: 55.00    Pack years: 13.75    Types: Cigarettes    Quit date: 05/21/2017    Years since quitting: 2.6  . Smokeless tobacco: Never Used  . Tobacco comment: started age 39 1/2 to 1 ppd  Substance and Sexual Activity  . Alcohol use: Yes    Alcohol/week: 0.0 standard drinks    Comment: occasionally drinks beer / 1 month  . Drug use: No  . Sexual activity: Never  Other Topics Concern  . Not on file  Social History Narrative   Lives at home alone   Social Determinants of Health   Financial Resource Strain: Low Risk   . Difficulty of Paying  Living Expenses: Not hard at all  Food Insecurity: No Food Insecurity  . Worried About Charity fundraiser in the Last Year: Never true  . Ran Out of Food in the Last Year: Never true  Transportation Needs: No Transportation Needs  . Lack of Transportation (Medical): No  . Lack of Transportation (Non-Medical): No  Physical Activity: Inactive  . Days of Exercise per Week: 0 days  . Minutes of Exercise per Session: 0 min  Stress: No Stress Concern Present  . Feeling of Stress : Not at all  Social Connections: Moderately Isolated  . Frequency of Communication with Friends and Family: More than three times a week  . Frequency of Social Gatherings with Friends and Family: Once a week  . Attends Religious Services: Never  . Active Member of Clubs or Organizations: No  . Attends Archivist Meetings: Never  . Marital Status: Divorced    Outpatient Encounter Medications as of 01/19/2020  Medication Sig  . apixaban (ELIQUIS) 5 MG TABS tablet Take 1 tablet (5 mg total) by mouth 2 (two) times daily.  . budesonide-formoterol (SYMBICORT) 80-4.5 MCG/ACT inhaler Inhale 2 puffs into the lungs 2 (two) times daily.  . calcium citrate-vitamin D  (CITRACAL+D) 315-200 MG-UNIT tablet Take 1 tablet by mouth daily.  . Cholecalciferol (VITAMIN D3) 50 MCG (2000 UT) TABS Take 2,000 Units by mouth daily.  . diphenhydrAMINE (BENADRYL) 25 MG tablet Take 2 tablets (50 mg total) by mouth at bedtime as needed for itching or allergies.  Marland Kitchen encorafenib (BRAFTOVI) 75 MG capsule Take 150 mg by mouth daily.  Marland Kitchen EPINEPHrine 0.3 mg/0.3 mL IJ SOAJ injection Inject 0.3 mLs (0.3 mg total) into the muscle as needed for anaphylaxis.  Marland Kitchen gabapentin (NEURONTIN) 400 MG capsule TAKE 1 CAPSULE THREE TIMES DAILY (Patient taking differently: Take 400 mg by mouth 2 (two) times daily. )  . HYDROcodone-acetaminophen (NORCO) 7.5-325 MG tablet Take 1-2 tablets by mouth every 6 (six) hours as needed for moderate pain.  . meloxicam (MOBIC) 15 MG tablet TAKE 1 TABLET EVERY DAY AS NEEDED (Patient taking differently: Take 15 mg by mouth at bedtime. )  . metoprolol succinate (TOPROL-XL) 100 MG 24 hr tablet Take 1 tablet (100 mg total) by mouth daily. Take with or immediately following a meal.  . metoprolol tartrate (LOPRESSOR) 50 MG tablet Take 1 tablet (50 mg total) by mouth 2 (two) times daily.  . Multiple Vitamins-Minerals (WOMENS MULTI VITAMIN & MINERAL PO) Take 2 tablets by mouth daily.   Marland Kitchen omeprazole (PRILOSEC) 20 MG capsule Take 1 capsule (20 mg total) by mouth daily.  . pravastatin (PRAVACHOL) 40 MG tablet TAKE 1 TABLET EVERY DAY (Patient taking differently: Take 40 mg by mouth daily. )  . Probiotic Product (PROBIOTIC DAILY PO) Take 1 capsule by mouth daily.   Marland Kitchen senna-docusate (SENOKOT-S) 8.6-50 MG tablet Take 2 tablets by mouth daily.  . [DISCONTINUED] apixaban (ELIQUIS) 5 MG TABS tablet Take 1 tablet (5 mg total) by mouth 2 (two) times daily.  . [DISCONTINUED] HYDROcodone-acetaminophen (NORCO/VICODIN) 5-325 MG tablet Take 1-2 tablets by mouth every 6 (six) hours as needed for moderate pain.   No facility-administered encounter medications on file as of 01/19/2020.     Activities of Daily Living In your present state of health, do you have any difficulty performing the following activities: 01/19/2020 01/10/2020  Hearing? N N  Vision? N N  Difficulty concentrating or making decisions? N N  Walking or climbing stairs? Y N  Comment Due  to pain in right leg from sciatica. -  Dressing or bathing? N N  Doing errands, shopping? N N  Preparing Food and eating ? N -  Using the Toilet? N -  In the past six months, have you accidently leaked urine? N -  Do you have problems with loss of bowel control? N -  Managing your Medications? N -  Managing your Finances? N -  Housekeeping or managing your Housekeeping? N -  Some recent data might be hidden    Patient Care Team: Birdie Sons, MD as PCP - General (Family Medicine) Earlie Server, MD as Consulting Physician (Oncology) Noreene Filbert, MD as Referring Physician (Radiation Oncology) Jules Husbands, MD as Consulting Physician (General Surgery) Corey Skains, MD as Consulting Physician (Cardiology)    Assessment:   This is a routine wellness examination for Kimberly [name].  Exercise Activities and Dietary recommendations Current Exercise Habits: The patient does not participate in regular exercise at present, Exercise limited by: neurologic condition(s)(Due to sciatica.)  Goals      Patient Stated   . Blood pressure mangagement (pt-stated)     Current Barriers:  Marland Kitchen Knowledge Deficits related to basic understanding of hypertension pathophysiology and self care management . Uncontrolled high blood pressure  Clinical Pharmacist Goal(s):  Marland Kitchen Over the next 30 days, patient will verbalize understanding of plan for hypertension management . Over the next 30 days, patient will demonstrate improved health management independence as evidenced by checking blood pressure as directed and notifying PCP if SBP>180 or DBP > 90, taking all medications as prescribe, and adhering to a low sodium diet as discussed. . Over  the next 30 days, patient will verbalize basic understanding of hypertension disease process and self health management plan as evidenced by patient report  Interventions:  . Evaluation of current treatment plan related to hypertension self management and patient's adherence to plan as established by provider. . Provided education to patient re: stroke prevention, s/s of heart attack and stroke, DASH diet, complications of uncontrolled blood pressure . Collaboration with Dr. Caryn Section regarding medication management of high blood pressure  Patient Self Care Activities:  . Self administers medications as prescribed . Attends all scheduled provider appointments . Calls provider office for new concerns, questions, or BP outside discussed parameters . Checks BP and records as discussed . Follows a low sodium diet/DASH diet  Initial goal documentation      . I don't know what a COPD action plan is (pt-stated)     Current Barriers:   Knowledge deficit related to basic COPD self care/management  Knowledge deficit related to basic understanding of how inhaled medications work  Knowledge deficit related to importance of energy conservation   Nurse Case Manager Clinical Goal(s):  Over the next 14 days patient will report using inhalers as prescribed including rinsing mouth after use  Over the next 14 days patient will report utilizing pursed lip breathing for shortness of breath  Over the next 14 days, patient will be able to verbalize understanding of COPD action plan and when to seek appropriate levels of medical care   Interventions:   Provided patient with basic written and verbal COPD education on self care/management/and exacerbation prevention   Provided patient with COPD action plan and reinforced importance of daily self assessment  Provided written and verbal instructions on pursed lip breathing and utilized returned demonstration as teach back  COPD ACTION PLAN Actions to  Take if My Symptoms Get Worse   WHAT ZONE  ARE YOU IN TODAY? Green, Yellow, or Red?    GREEN ZONE: I am doing well today.   Symptoms Actions .  Usual activity and exercise level . Usual amounts of cough or phlegm/musuc . Sleep well at night . Appetite is good . Continue to take daily "maintenance" medicines (the ones you take "no matter what" until your doctor adjusts them for you) . Use oxygen as prescribed . Continue regular exercise/diet plan . At all times avoid cigarette smoke, inhaled iritants     YELLOW ZONE: I am having a bad day or a COPD flare.  Symptoms Actions .  More breathless than usual . I have less energy for my daily activities . Increased or thicker phlegm/mucus . Using quick relief inhaler/nebulizer more often . More coughing than usual . I feel like I have a "chest cold" . Poor sleep and my symptoms woke me up . My appetite is not good . My medicine is not helping . Swelling of ankles more than usual . Continue daily medications . Use quick relief inhaler or nebulizer every 4 hours . Use oxygen as prescribed . Get plenty of rest . Use pursed lip breathing . At all times avoid cigarette smoke, inhaled irritants.  Marland Kitchen CALL PROVIDER for same day or next day appointment for the following:  o If symptoms are not improving within 48 hours of onset or o If symptoms are not improving after quick relief inhaler or nebulizer o Start an oral corticosteroid (specify name, dose, and duration) . Start an antibiotic (specify name, dose, duration)    RED ZONE: I NEED URGENT MEDICAL CARE  Symptoms Actions .  Severe shortness of breath even at rest . Severe shortness of breath even after quick relief inhaler or nebulizer . Not able to lay flat because of breathing . Fever or shaking chills . Feeling confused or very drowsy . Chest pains . Coughin up blood . Quick relief inhaler or nebulizer not effective . Call 911 or have someone take you to the nearest emergency  room . Call provider for now or same day appointment     3-2-1 PLAN: If any of the 3 main COPD symptoms (Cough, Shortness of Breath, or Mucus Production) change for 2 days or more, your number 1 priority if to call your doctor.      Patient Self Care Activities:   Take medications as prescribed including inhalers  Practice and use pursed lip breathing for shortness of breath recovery and prevention  Self assess COPD action plan zone and make appointment with provider if you have been in the yellow zone for 48 hours without improvement.  Engage in lite exercise as tolerated 3-5 days a week  Utilize infection prevention strategies to reduce risk of respiratory infection        Other   . Decrease sugar intake     Recommend cutting down on sugar and sweets to eating 1 small portion a day versus 3 a day.    . Have 3 meals a day     Recommend eating 3 small meals a day with 2 healthy snack in between.        Fall Risk: Fall Risk  01/19/2020 12/29/2019 12/08/2019 11/17/2019 05/18/2019  Falls in the past year? 0 0 0 0 0  Number falls in past yr: 0 0 0 0 -  Comment - - - - -  Injury with Fall? 0 0 0 0 -  Follow up - - - - -  FALL RISK PREVENTION PERTAINING TO THE HOME:  Any stairs in or around the home? Yes  If so, are there any without handrails? No   Home free of loose throw rugs in walkways, pet beds, electrical cords, etc? Yes  Adequate lighting in your home to reduce risk of falls? Yes   ASSISTIVE DEVICES UTILIZED TO PREVENT FALLS:  Life alert? No  Use of a cane, walker or w/c? No  Grab bars in the bathroom? Yes  Shower chair or bench in shower? Yes  Elevated toilet seat or a handicapped toilet? No    TIMED UP AND GO:  Was the test performed? No .    Depression Screen PHQ 2/9 Scores 01/19/2020 05/18/2019 01/18/2019 03/31/2018  PHQ - 2 Score 0 0 0 0  PHQ- 9 Score - - - -     Cognitive Function     6CIT Screen 01/19/2020 01/10/2017  What Year? 0 points 0 points   What month? 0 points 0 points  What time? 0 points 0 points  Count back from 20 0 points 0 points  Months in reverse 4 points 2 points  Repeat phrase 0 points 0 points  Total Score 4 2    Immunization History  Administered Date(s) Administered  . Fluad Quad(high Dose 65+) 07/06/2019  . Influenza, High Dose Seasonal PF 07/12/2016, 12/05/2017, 06/09/2018  . Influenza-Unspecified 08/30/2013  . Pneumococcal Conjugate-13 06/30/2014  . Pneumococcal Polysaccharide-23 03/02/2013, 04/17/2017  . Tdap 12/02/2014  . Zoster 12/02/2014    Qualifies for Shingles Vaccine? Yes  Zostavax completed 12/02/14. Due for Shingrix. Pt has been advised to call insurance company to determine out of pocket expense. Advised may also receive vaccine at local pharmacy or Health Dept. Verbalized acceptance and understanding.  Tdap: Up to date  Flu Vaccine: Up to date  Pneumococcal Vaccine: Completed series  Screening Tests Health Maintenance  Topic Date Due  . COVID-19 Vaccine (1) Never done  . INFLUENZA VACCINE  04/30/2020  . COLONOSCOPY  08/12/2020  . MAMMOGRAM  12/06/2020  . DEXA SCAN  06/21/2022  . TETANUS/TDAP  12/01/2024  . Hepatitis C Screening  Completed  . PNA vac Low Risk Adult  Completed    Cancer Screenings:  Colorectal Screening: Completed 08/13/19. Repeat every year.   Mammogram: Completed 12/07/18. Repeat every 1-2 years as advised. Ordered 12/24/19. Pt plans to call [place] to r/s apt.  Bone Density: Completed 06/22/19. Results reflect OSTEOPENIA. Repeat every 3 years.   Lung Cancer Screening: (Low Dose CT Chest recommended if Age 26-80 years, 30 pack-year currently smoking OR have quit w/in 15years.) does qualify, however had this completed 03/30/19. Repeat yearly.  Additional Screening:  Hepatitis C Screening: Up to date  Vision Screening: Recommended annual ophthalmology exams for early detection of glaucoma and other disorders of the eye.  Dental Screening: Recommended annual  dental exams for proper oral hygiene  Community Resource Referral:  CRR required this visit?  No       Plan:  I have personally reviewed and addressed the Medicare Annual Wellness questionnaire and have noted the following in the patient's chart:  A. Medical and social history B. Use of alcohol, tobacco or illicit drugs  C. Current medications and supplements D. Functional ability and status E.  Nutritional status F.  Physical activity G. Advance directives H. List of other physicians I.  Hospitalizations, surgeries, and ER visits in previous 12 months J.  [place] such as hearing and vision if needed, cognitive and depression L. Referrals  and appointments   In addition, I have reviewed and discussed with patient certain preventive protocols, quality metrics, and best practice recommendations. A written personalized care plan for preventive services as well as general preventive health recommendations were provided to patient. Nurse Health Advisor  Signed,    Brent Taillon [place], [place]  [phone] Nurse Health Advisor   Nurse Notes: None.

## 2020-01-19 ENCOUNTER — Ambulatory Visit (INDEPENDENT_AMBULATORY_CARE_PROVIDER_SITE_OTHER): Payer: Medicare HMO

## 2020-01-19 DIAGNOSIS — Z87891 Personal history of nicotine dependence: Secondary | ICD-10-CM | POA: Diagnosis not present

## 2020-01-19 DIAGNOSIS — Z0000 Encounter for general adult medical examination without abnormal findings: Secondary | ICD-10-CM | POA: Diagnosis not present

## 2020-01-19 NOTE — Patient Instructions (Signed)
Kimberly [name] , Thank you for taking time to come for your Medicare Wellness Visit. I appreciate your ongoing commitment to your health goals. Please review the following plan we discussed and let me know if I can assist you in the future.   Screening recommendations/referrals: Colonoscopy: Up to date, due 07/2020 Mammogram: Up to date, due 11/2020, ordered 12/24/19 Bone Density: Up to date, due 05/2022 Recommended yearly ophthalmology/optometry visit for glaucoma screening and checkup Recommended yearly dental visit for hygiene and checkup  Vaccinations: Influenza vaccine: Up to date Pneumococcal vaccine: Completed series Tdap vaccine: Up to date, due 11/2024 Shingles vaccine: Pt declines today.     Advanced directives: Currently on file  Conditions/risks identified: Recommend to continue to cut back on all sugars in diet to help with weight loss.   Next appointment: 02/18/20 @ 2:40 PM with Dr Caryn Section    Preventive Care 73 Years and Older, Female Preventive care refers to lifestyle choices and visits with your health care provider that can promote health and wellness. What does preventive care include?  A yearly physical exam. This is also called an annual well check.  Dental exams once or twice a year.  Routine eye exams. Ask your health care provider how often you should have your eyes checked.  Personal lifestyle choices, including:  Daily care of your teeth and gums.  Regular physical activity.  Eating a healthy diet.  Avoiding tobacco and drug use.  Limiting alcohol use.  Practicing safe sex.  Taking low-dose aspirin every day.  Taking vitamin and mineral supplements as recommended by your health care provider. What happens during an annual well check? The services and screenings done by your health care provider during your annual well check will depend on your age, overall health, lifestyle risk factors, and family history of disease. Counseling  Your health care  provider may ask you questions about your:  Alcohol use.  Tobacco use.  Drug use.  Emotional well-being.  Home and relationship well-being.  Sexual activity.  Eating habits.  History of falls.  Memory and ability to understand (cognition).  Work and work Statistician.  Reproductive health. Screening  You may have the following tests or measurements:  Height, weight, and BMI.  Blood pressure.  Lipid and cholesterol levels. These may be checked every 5 years, or more frequently if you are over 45 years old.  Skin check.  Lung cancer screening. You may have this screening every year starting at age 73 if you have a 30-pack-year history of smoking and currently smoke or have quit within the past 15 years.  Fecal occult blood test (FOBT) of the stool. You may have this test every year starting at age 30.  Flexible sigmoidoscopy or colonoscopy. You may have a sigmoidoscopy every 5 years or a colonoscopy every 10 years starting at age 37.  Hepatitis C blood test.  Hepatitis B blood test.  Sexually transmitted disease (STD) testing.  Diabetes screening. This is done by checking your blood sugar (glucose) after you have not eaten for a while (fasting). You may have this done every 1-3 years.  Bone density scan. This is done to screen for osteoporosis. You may have this done starting at age 21.  Mammogram. This may be done every 1-2 years. Talk to your health care provider about how often you should have regular mammograms. Talk with your health care provider about your test results, treatment options, and if necessary, the need for more tests. Vaccines  Your health care provider  may recommend certain vaccines, such as:  Influenza vaccine. This is recommended every year.  Tetanus, diphtheria, and acellular pertussis (Tdap, Td) vaccine. You may need a Td booster every 10 years.  Zoster vaccine. You may need this after age 65.  Pneumococcal 13-valent conjugate (PCV13)  vaccine. One dose is recommended after age 64.  Pneumococcal polysaccharide (PPSV23) vaccine. One dose is recommended after age 21. Talk to your health care provider about which screenings and vaccines you need and how often you need them. This information is not intended to replace advice given to you by your health care provider. Make sure you discuss any questions you have with your health care provider. Document Released: 10/13/2015 Document Revised: 06/05/2016 Document Reviewed: 07/18/2015 Elsevier Interactive Patient Education  2017 [employer] Prevention in the Home Falls can cause injuries. They can happen to people of all ages. There are many things you can do to make your home safe and to help prevent falls. What can I do on the outside of my home?  Regularly fix the edges of walkways and driveways and fix any cracks.  Remove anything that might make you trip as you walk through a door, such as a raised step or threshold.  Trim any bushes or trees on the path to your home.  Use bright outdoor lighting.  Clear any walking paths of anything that might make someone trip, such as rocks or tools.  Regularly check to see if handrails are loose or broken. Make sure that both sides of any steps have handrails.  Any raised decks and porches should have guardrails on the edges.  Have any leaves, snow, or ice cleared regularly.  Use sand or salt on walking paths during winter.  Clean up any spills in your garage right away. This includes oil or grease spills. What can I do in the bathroom?  Use night lights.  Install grab bars by the toilet and in the tub and shower. Do not use towel bars as grab bars.  Use non-skid mats or decals in the tub or shower.  If you need to sit down in the shower, use a plastic, non-slip stool.  Keep the floor dry. Clean up any water that spills on the floor as soon as it happens.  Remove soap buildup in the tub or shower  regularly.  Attach bath mats securely with double-sided non-slip rug tape.  Do not have throw rugs and other things on the floor that can make you trip. What can I do in the bedroom?  Use night lights.  Make sure that you have a light by your bed that is easy to reach.  Do not use any sheets or blankets that are too big for your bed. They should not hang down onto the floor.  Have a firm chair that has side arms. You can use this for support while you get dressed.  Do not have throw rugs and other things on the floor that can make you trip. What can I do in the kitchen?  Clean up any spills right away.  Avoid walking on wet floors.  Keep items that you use a lot in easy-to-reach places.  If you need to reach something above you, use a strong step stool that has a grab bar.  Keep electrical cords out of the way.  Do not use floor polish or wax that makes floors slippery. If you must use wax, use non-skid floor wax.  Do not have throw rugs  and other things on the floor that can make you trip. What can I do with my stairs?  Do not leave any items on the stairs.  Make sure that there are handrails on both sides of the stairs and use them. Fix handrails that are broken or loose. Make sure that handrails are as long as the stairways.  Check any carpeting to make sure that it is firmly attached to the stairs. Fix any carpet that is loose or worn.  Avoid having throw rugs at the top or bottom of the stairs. If you do have throw rugs, attach them to the floor with carpet tape.  Make sure that you have a light switch at the top of the stairs and the bottom of the stairs. If you do not have them, ask someone to add them for you. What else can I do to help prevent falls?  Wear shoes that:  Do not have high heels.  Have rubber bottoms.  Are comfortable and fit you well.  Are closed at the toe. Do not wear sandals.  If you use a stepladder:  Make sure that it is fully  opened. Do not climb a closed stepladder.  Make sure that both sides of the stepladder are locked into place.  Ask someone to hold it for you, if possible.  Clearly mark and make sure that you can see:  Any grab bars or handrails.  First and last steps.  Where the edge of each step is.  Use tools that help you move around (mobility aids) if they are needed. These include:  Canes.  Walkers.  Scooters.  Crutches.  Turn on the lights when you go into a dark area. Replace any light bulbs as soon as they burn out.  Set up your furniture so you have a clear path. Avoid moving your furniture around.  If any of your floors are uneven, fix them.  If there are any pets around you, be aware of where they are.  Review your medicines with your doctor. Some medicines can make you feel dizzy. This can increase your chance of falling. Ask your doctor what other things that you can do to help prevent falls. This information is not intended to replace advice given to you by your health care provider. Make sure you discuss any questions you have with your health care provider. Document Released: 07/13/2009 Document Revised: 02/22/2016 Document Reviewed: 10/21/2014 Elsevier Interactive Patient Education  2017 [employer].

## 2020-01-20 ENCOUNTER — Other Ambulatory Visit: Payer: Self-pay

## 2020-01-20 ENCOUNTER — Inpatient Hospital Stay: Payer: Medicare HMO

## 2020-01-20 ENCOUNTER — Encounter: Payer: Self-pay | Admitting: Oncology

## 2020-01-20 ENCOUNTER — Telehealth: Payer: Self-pay | Admitting: *Deleted

## 2020-01-20 ENCOUNTER — Inpatient Hospital Stay (HOSPITAL_BASED_OUTPATIENT_CLINIC_OR_DEPARTMENT_OTHER): Payer: Medicare HMO | Admitting: Oncology

## 2020-01-20 VITALS — BP 123/77 | HR 72 | Temp 97.6°F | Resp 18 | Wt 159.1 lb

## 2020-01-20 DIAGNOSIS — C184 Malignant neoplasm of transverse colon: Secondary | ICD-10-CM

## 2020-01-20 DIAGNOSIS — R079 Chest pain, unspecified: Secondary | ICD-10-CM | POA: Diagnosis not present

## 2020-01-20 DIAGNOSIS — R634 Abnormal weight loss: Secondary | ICD-10-CM | POA: Diagnosis not present

## 2020-01-20 DIAGNOSIS — G893 Neoplasm related pain (acute) (chronic): Secondary | ICD-10-CM | POA: Diagnosis not present

## 2020-01-20 DIAGNOSIS — Z5112 Encounter for antineoplastic immunotherapy: Secondary | ICD-10-CM | POA: Diagnosis not present

## 2020-01-20 DIAGNOSIS — M7989 Other specified soft tissue disorders: Secondary | ICD-10-CM | POA: Diagnosis not present

## 2020-01-20 DIAGNOSIS — I4891 Unspecified atrial fibrillation: Secondary | ICD-10-CM | POA: Diagnosis not present

## 2020-01-20 DIAGNOSIS — D509 Iron deficiency anemia, unspecified: Secondary | ICD-10-CM | POA: Diagnosis not present

## 2020-01-20 DIAGNOSIS — K56609 Unspecified intestinal obstruction, unspecified as to partial versus complete obstruction: Secondary | ICD-10-CM | POA: Diagnosis not present

## 2020-01-20 DIAGNOSIS — Z5111 Encounter for antineoplastic chemotherapy: Secondary | ICD-10-CM | POA: Diagnosis not present

## 2020-01-20 LAB — CBC WITH DIFFERENTIAL/PLATELET
Abs Immature Granulocytes: 0.05 10*3/uL (ref 0.00–0.07)
Basophils Absolute: 0 10*3/uL (ref 0.0–0.1)
Basophils Relative: 0 %
Eosinophils Absolute: 0.2 10*3/uL (ref 0.0–0.5)
Eosinophils Relative: 2 %
HCT: 38.4 % (ref 36.0–46.0)
Hemoglobin: 12 g/dL (ref 12.0–15.0)
Immature Granulocytes: 1 %
Lymphocytes Relative: 13 %
Lymphs Abs: 1.3 10*3/uL (ref 0.7–4.0)
MCH: 27.4 pg (ref 26.0–34.0)
MCHC: 31.3 g/dL (ref 30.0–36.0)
MCV: 87.7 fL (ref 80.0–100.0)
Monocytes Absolute: 1.1 10*3/uL — ABNORMAL HIGH (ref 0.1–1.0)
Monocytes Relative: 11 %
Neutro Abs: 7.6 10*3/uL (ref 1.7–7.7)
Neutrophils Relative %: 73 %
Platelets: 351 10*3/uL (ref 150–400)
RBC: 4.38 MIL/uL (ref 3.87–5.11)
RDW: 21 % — ABNORMAL HIGH (ref 11.5–15.5)
WBC: 10.3 10*3/uL (ref 4.0–10.5)
nRBC: 0 % (ref 0.0–0.2)

## 2020-01-20 LAB — COMPREHENSIVE METABOLIC PANEL
ALT: 11 U/L (ref 0–44)
AST: 9 U/L — ABNORMAL LOW (ref 15–41)
Albumin: 2.9 g/dL — ABNORMAL LOW (ref 3.5–5.0)
Alkaline Phosphatase: 78 U/L (ref 38–126)
Anion gap: 9 (ref 5–15)
BUN: 16 mg/dL (ref 8–23)
CO2: 26 mmol/L (ref 22–32)
Calcium: 8.4 mg/dL — ABNORMAL LOW (ref 8.9–10.3)
Chloride: 102 mmol/L (ref 98–111)
Creatinine, Ser: 0.85 mg/dL (ref 0.44–1.00)
GFR calc Af Amer: >60 mL/min (ref >60)
GFR calc non Af Amer: >60 mL/min (ref >60)
Glucose, Bld: 178 mg/dL — ABNORMAL HIGH (ref 70–99)
Potassium: 3.7 mmol/L (ref 3.5–5.1)
Sodium: 137 mmol/L (ref 135–145)
Total Bilirubin: 0.4 mg/dL (ref 0.3–1.2)
Total Protein: 6.2 g/dL — ABNORMAL LOW (ref 6.5–8.1)

## 2020-01-20 MED ORDER — FUROSEMIDE 20 MG PO TABS: 20.0000 mg | ORAL_TABLET | Freq: Every day | ORAL | 0 refills | Status: DC | PRN

## 2020-01-20 NOTE — Telephone Encounter (Signed)
Patient called asking for medicine for swelling in face hands and feet. Please advise

## 2020-01-20 NOTE — Telephone Encounter (Signed)
Dr. Tasia Catchings would like to evaluate the patient.  LC, please add her to see Dr. Tasia Catchings today at 1:30 for acute add on.

## 2020-01-20 NOTE — Telephone Encounter (Signed)
Done... Pt has been added on as requested per Dr. Tasia Catchings.

## 2020-01-20 NOTE — Progress Notes (Signed)
Patient here with complaints of swelling to feet, hand and face. Swelling started yesterday after taking Braftovi. Pt also reports feeling weak. Denies trouble breathing. Complains of mild pain to chest.

## 2020-01-20 NOTE — Telephone Encounter (Signed)
Called patient for more information on edema symptoms.    Patient started the lower dose of Encorafenib 150mg  daily on Monday.  Yesterday she started having lower extremity edema that is painful when walking.  Also has facial and lip swelling yesterday as well.  Denies any difficulty of breathing.  Has not taken the Encorafenib today.  Advised her to not take until I talk with Dr. Tasia Catchings.  I asked her if she has Benadryl at home but she does not.

## 2020-01-21 ENCOUNTER — Inpatient Hospital Stay: Payer: Medicare HMO

## 2020-01-21 ENCOUNTER — Encounter: Payer: Self-pay | Admitting: Oncology

## 2020-01-21 ENCOUNTER — Inpatient Hospital Stay (HOSPITAL_BASED_OUTPATIENT_CLINIC_OR_DEPARTMENT_OTHER): Payer: Medicare HMO | Admitting: Oncology

## 2020-01-21 VITALS — BP 118/73 | HR 73 | Temp 97.3°F | Resp 18 | Wt 158.2 lb

## 2020-01-21 DIAGNOSIS — C184 Malignant neoplasm of transverse colon: Secondary | ICD-10-CM | POA: Diagnosis not present

## 2020-01-21 DIAGNOSIS — Z7189 Other specified counseling: Secondary | ICD-10-CM

## 2020-01-21 DIAGNOSIS — R5383 Other fatigue: Secondary | ICD-10-CM

## 2020-01-21 DIAGNOSIS — G893 Neoplasm related pain (acute) (chronic): Secondary | ICD-10-CM | POA: Diagnosis not present

## 2020-01-21 DIAGNOSIS — R634 Abnormal weight loss: Secondary | ICD-10-CM | POA: Diagnosis not present

## 2020-01-21 DIAGNOSIS — Z5112 Encounter for antineoplastic immunotherapy: Secondary | ICD-10-CM | POA: Diagnosis not present

## 2020-01-21 DIAGNOSIS — M7989 Other specified soft tissue disorders: Secondary | ICD-10-CM | POA: Diagnosis not present

## 2020-01-21 DIAGNOSIS — I4891 Unspecified atrial fibrillation: Secondary | ICD-10-CM | POA: Diagnosis not present

## 2020-01-21 DIAGNOSIS — D509 Iron deficiency anemia, unspecified: Secondary | ICD-10-CM | POA: Diagnosis not present

## 2020-01-21 DIAGNOSIS — K56609 Unspecified intestinal obstruction, unspecified as to partial versus complete obstruction: Secondary | ICD-10-CM | POA: Diagnosis not present

## 2020-01-21 LAB — COMPREHENSIVE METABOLIC PANEL
ALT: 10 U/L (ref 0–44)
AST: 11 U/L — ABNORMAL LOW (ref 15–41)
Albumin: 2.7 g/dL — ABNORMAL LOW (ref 3.5–5.0)
Alkaline Phosphatase: 73 U/L (ref 38–126)
Anion gap: 11 (ref 5–15)
BUN: 15 mg/dL (ref 8–23)
CO2: 25 mmol/L (ref 22–32)
Calcium: 8.1 mg/dL — ABNORMAL LOW (ref 8.9–10.3)
Chloride: 102 mmol/L (ref 98–111)
Creatinine, Ser: 0.77 mg/dL (ref 0.44–1.00)
GFR calc Af Amer: >60 mL/min (ref >60)
GFR calc non Af Amer: >60 mL/min (ref >60)
Glucose, Bld: 173 mg/dL — ABNORMAL HIGH (ref 70–99)
Potassium: 3.4 mmol/L — ABNORMAL LOW (ref 3.5–5.1)
Sodium: 138 mmol/L (ref 135–145)
Total Bilirubin: 0.3 mg/dL (ref 0.3–1.2)
Total Protein: 5.9 g/dL — ABNORMAL LOW (ref 6.5–8.1)

## 2020-01-21 LAB — MAGNESIUM: Magnesium: 1.6 mg/dL — ABNORMAL LOW (ref 1.7–2.4)

## 2020-01-21 LAB — CBC WITH DIFFERENTIAL/PLATELET
Abs Immature Granulocytes: 0.07 10*3/uL (ref 0.00–0.07)
Basophils Absolute: 0 10*3/uL (ref 0.0–0.1)
Basophils Relative: 0 %
Eosinophils Absolute: 0.2 10*3/uL (ref 0.0–0.5)
Eosinophils Relative: 2 %
HCT: 37.4 % (ref 36.0–46.0)
Hemoglobin: 11.8 g/dL — ABNORMAL LOW (ref 12.0–15.0)
Immature Granulocytes: 1 %
Lymphocytes Relative: 7 %
Lymphs Abs: 0.8 10*3/uL (ref 0.7–4.0)
MCH: 27.5 pg (ref 26.0–34.0)
MCHC: 31.6 g/dL (ref 30.0–36.0)
MCV: 87.2 fL (ref 80.0–100.0)
Monocytes Absolute: 1.3 10*3/uL — ABNORMAL HIGH (ref 0.1–1.0)
Monocytes Relative: 11 %
Neutro Abs: 9.2 10*3/uL — ABNORMAL HIGH (ref 1.7–7.7)
Neutrophils Relative %: 79 %
Platelets: 348 10*3/uL (ref 150–400)
RBC: 4.29 MIL/uL (ref 3.87–5.11)
RDW: 20.6 % — ABNORMAL HIGH (ref 11.5–15.5)
WBC: 11.6 10*3/uL — ABNORMAL HIGH (ref 4.0–10.5)
nRBC: 0 % (ref 0.0–0.2)

## 2020-01-21 MED ORDER — HEPARIN SOD (PORK) LOCK FLUSH 100 UNIT/ML IV SOLN
500.0000 [IU] | Freq: Once | INTRAVENOUS | Status: AC
  Administered 2020-01-21: 09:00:00 500 [IU] via INTRAVENOUS
  Filled 2020-01-21: qty 5

## 2020-01-21 MED ORDER — SODIUM CHLORIDE 0.9% FLUSH
10.0000 mL | Freq: Once | INTRAVENOUS | Status: AC
  Administered 2020-01-21: 10 mL via INTRAVENOUS
  Filled 2020-01-21: qty 10

## 2020-01-21 MED ORDER — HEPARIN SOD (PORK) LOCK FLUSH 100 UNIT/ML IV SOLN
INTRAVENOUS | Status: AC
  Filled 2020-01-21: qty 5

## 2020-01-21 NOTE — Progress Notes (Signed)
Patient here for follow up and tx. When asked if she feels better from yesterday she states "I feel worse, I feel yucky." pain 7/10 to right arm and back.

## 2020-01-22 ENCOUNTER — Emergency Department
Admission: EM | Admit: 2020-01-22 | Discharge: 2020-01-22 | Disposition: A | Discharge status: Home / Self Care | Payer: Medicare HMO | Attending: Emergency Medicine | Admitting: Emergency Medicine

## 2020-01-22 ENCOUNTER — Other Ambulatory Visit: Payer: Self-pay

## 2020-01-22 ENCOUNTER — Encounter: Payer: Self-pay | Admitting: Emergency Medicine

## 2020-01-22 DIAGNOSIS — Z853 Personal history of malignant neoplasm of breast: Secondary | ICD-10-CM | POA: Insufficient documentation

## 2020-01-22 DIAGNOSIS — I10 Essential (primary) hypertension: Secondary | ICD-10-CM | POA: Insufficient documentation

## 2020-01-22 DIAGNOSIS — Z79899 Other long term (current) drug therapy: Secondary | ICD-10-CM | POA: Insufficient documentation

## 2020-01-22 DIAGNOSIS — I4891 Unspecified atrial fibrillation: Secondary | ICD-10-CM | POA: Diagnosis not present

## 2020-01-22 DIAGNOSIS — Z87891 Personal history of nicotine dependence: Secondary | ICD-10-CM | POA: Diagnosis not present

## 2020-01-22 DIAGNOSIS — M549 Dorsalgia, unspecified: Secondary | ICD-10-CM | POA: Diagnosis present

## 2020-01-22 DIAGNOSIS — M79601 Pain in right arm: Secondary | ICD-10-CM | POA: Diagnosis not present

## 2020-01-22 DIAGNOSIS — J449 Chronic obstructive pulmonary disease, unspecified: Secondary | ICD-10-CM | POA: Diagnosis not present

## 2020-01-22 DIAGNOSIS — Z7901 Long term (current) use of anticoagulants: Secondary | ICD-10-CM | POA: Insufficient documentation

## 2020-01-22 DIAGNOSIS — R5383 Other fatigue: Secondary | ICD-10-CM | POA: Insufficient documentation

## 2020-01-22 LAB — BASIC METABOLIC PANEL
Anion gap: 8 (ref 5–15)
BUN: 13 mg/dL (ref 8–23)
CO2: 27 mmol/L (ref 22–32)
Calcium: 8 mg/dL — ABNORMAL LOW (ref 8.9–10.3)
Chloride: 100 mmol/L (ref 98–111)
Creatinine, Ser: 0.69 mg/dL (ref 0.44–1.00)
GFR calc Af Amer: >60 mL/min (ref >60)
GFR calc non Af Amer: >60 mL/min (ref >60)
Glucose, Bld: 96 mg/dL (ref 70–99)
Potassium: 3.5 mmol/L (ref 3.5–5.1)
Sodium: 135 mmol/L (ref 135–145)

## 2020-01-22 LAB — CBC WITH DIFFERENTIAL/PLATELET
Abs Immature Granulocytes: 0.04 10*3/uL (ref 0.00–0.07)
Basophils Absolute: 0.1 10*3/uL (ref 0.0–0.1)
Basophils Relative: 1 %
Eosinophils Absolute: 0.2 10*3/uL (ref 0.0–0.5)
Eosinophils Relative: 2 %
HCT: 38.2 % (ref 36.0–46.0)
Hemoglobin: 11.9 g/dL — ABNORMAL LOW (ref 12.0–15.0)
Immature Granulocytes: 0 %
Lymphocytes Relative: 17 %
Lymphs Abs: 1.7 10*3/uL (ref 0.7–4.0)
MCH: 27.5 pg (ref 26.0–34.0)
MCHC: 31.2 g/dL (ref 30.0–36.0)
MCV: 88.4 fL (ref 80.0–100.0)
Monocytes Absolute: 1.3 10*3/uL — ABNORMAL HIGH (ref 0.1–1.0)
Monocytes Relative: 14 %
Neutro Abs: 6.4 10*3/uL (ref 1.7–7.7)
Neutrophils Relative %: 66 %
Platelets: 405 10*3/uL — ABNORMAL HIGH (ref 150–400)
RBC: 4.32 MIL/uL (ref 3.87–5.11)
RDW: 20.7 % — ABNORMAL HIGH (ref 11.5–15.5)
WBC: 9.7 10*3/uL (ref 4.0–10.5)
nRBC: 0 % (ref 0.0–0.2)

## 2020-01-22 LAB — CEA: CEA: 11.2 ng/mL — ABNORMAL HIGH (ref 0.0–4.7)

## 2020-01-22 LAB — SEDIMENTATION RATE: Sed Rate: 1 mm/hr (ref 0–30)

## 2020-01-22 MED ORDER — MAGNESIUM CHLORIDE 64 MG PO TBEC: 1.0000 | DELAYED_RELEASE_TABLET | Freq: Every day | ORAL | 0 refills | Status: DC

## 2020-01-22 MED ORDER — OXYCODONE-ACETAMINOPHEN 5-325 MG PO TABS: 1.0000 | ORAL_TABLET | ORAL | 0 refills | Status: DC | PRN

## 2020-01-22 MED ORDER — KETOROLAC TROMETHAMINE 30 MG/ML IJ SOLN
30.0000 mg | Freq: Once | INTRAMUSCULAR | Status: AC
  Administered 2020-01-22: 30 mg via INTRAMUSCULAR
  Filled 2020-01-22: qty 1

## 2020-01-22 NOTE — Discharge Instructions (Signed)
You may take the prescribed oxycodone-acetaminophen (percocet) instead of the hydrocodone you have at home to provide some additional pain relief.  DO NOT take them together.  Return to the ER for new, worsening or persistent arm pain, weakness, numbness, swelling or any other new or worsening symptoms that concern you.  Follow up with your primary care doctor and oncologist.

## 2020-01-22 NOTE — ED Triage Notes (Signed)
Pt to ED via POV c/o upper back pain and bilateral arm pain. Pt states that pain started yesterday in her right wrist then started going up her arm and across her back. Pt denies any injury to the area. Denies any heavy lifting. Pt recently admitted for chest pain and shortness of breath. Pt has congested sounding cough but states that this is normal for her with her hx/o COPD. Pt is in NAD.

## 2020-01-22 NOTE — Progress Notes (Signed)
[facility] Follow up  Visit:  Patient Care Team: Birdie Sons, MD as PCP - General (Family Medicine) Earlie Server, MD as Consulting Physician (Oncology) Noreene Filbert, MD as Referring Physician (Radiation Oncology) Jules Husbands, MD as Consulting Physician (General Surgery) Corey Skains, MD as Consulting Physician (Cardiology)  PURPOSE OF VISIT: Stage IV colon cancer  HISTORY OF PRESENTING ILLNESS: Kimberly [name] 74 y.o. female with PMH listed as below presents for follow up for the management of Stage IIIB Colon Cancer Patient reports a remote history of breast cancer s/p lumpectomy and radiation treatments.   Patient had been having abdominal pain, weight loss and bloating.  CT scan showed partial obstruction at the level of transverse colon and ileocolic mesenteric adenopathy. She was prepared for a colonoscopy on 04/15/2017. She started to have worsened abdominal pain, nausea vomiting, unable to maintain oral intake and was sent to ED. She was found to have bowel obstruction and underwent  exploratory laparotomy with transverse colectomy, with end colostomy and mucous fistula formation for obstructing colon lesion. Differential diagnosis prior to surgery was metastatic breast cancer in colon vs colon cancer. The patient did have a colonoscopy 2 years ago without the mass being present at that time but did have 2 small polyps in the transverse colon that were removed.  04/15/2017 Patient underwent transverse colon resection.  Pathology showed T4aN1 moderate differentiated adenocarcinoma, Grade 2, tumor invades visceral peritoneum, LVI present, negative margin, 3/16 lymph nodes involved with cancer. Low probability of MSI-H.   # Genetic test INVITAE came back negative. No pathogenetic sequence variants or deletions/duplications identified. Results was scanned to [facility] (a copy of the report was provided to patient and her daughter)  # # baseline CEA is 2.1 #  colostomy reversal on 06/25/2018, subsequently developed C. difficile colitis x 2.  Multiple abdominal CT abdomen and pelvis for obtained, not listed here #09/01/2018 CT abdomen pelvis showed abdominal wall abscess,  percutaneous drain placement .treatment with antibiotics with Augmentin 10 days course.  #09/14/2018 CT abdomen/pelvis showed resolution of abscess, no intraabdominal abscess.  Mediport was discontinued. # 08/13/2019 colonoscopy showed localized area of mildly thickened folds of the mucosa was found in the sigmoid colon. Biopsy pathology showed benign mucosa with lymphoid aggregates and focal minimal inflammation. No dysplasia.   TREATMENT:  04/15/2017 transverse colon resection 05/27/2017- 11/17/2017 adjuvant FOLFOX, Oxaliplatin was discontinued after 3 cycles due to worsening of pre-existing neuropathy. Finished another 9 cycles of 5-FU.  March -April 2019 adjuvant RT.  06/25/2018 Colostomy Reversal.   09/21/2019 PET scan showed 2 small right abdominal mesenteric soft tissue nodule which showed no significant change in size compared to recent CT but hypermetabolic.  Peritoneal metastatic disease cannot be excluded. 1.1 cm hypermetabolic nodule in the right parotid gland suspicious for primary parotid neoplasm.  08/27/2020, CT-guided biopsy of abdominal wall mass was obtained. Results showed metastatic adenocarcinoma, compatible with colon primary.  # 12/07/2019 She underwent surgery for laparoscopic evaluation of peritoneum and placement of Mediport. # 12/08/2019- 12/10/2019  She was admitted due to atrial fibrillation.  Patient was started on anticoagulation with Eliquis 5 mg twice daily.  Patient was reverted back to normal sinus rhythm.  She is on metoprolol for rate control.  # 12/24/2019 She had anaphylactic reaction to Cetuximab She got epinephrine. Was not intubated.   INTERVAL HISTORY 74 y.o. female  presents for follow-up of recurrent colon cancer. Patient has been on  Panitumumab status post 1 treatment and Encorafenib 150  mg daily since 4 days ago. Patient was seen yesterday and complained about hand and feet swelling as well as facial swelling although by the time she is in the clinic, per my physical examination, she does not appear to have any significant swelling. Patient was recommended to continue treatments and close follow-up. Today she was accompanied by her daughter. She has multiple complaints.  Reports feeling tired and fatigued.  She also has experienced diffuse muscle and bone pain.  She is not willing to continue with treatments.    Review of Systems  Constitutional: Positive for fatigue. Negative for appetite change, chills, fever and unexpected weight change.  HENT:   Negative for hearing loss, lump/mass, nosebleeds and voice change.   Eyes: Negative for eye problems.  Respiratory: Negative for chest tightness, cough and shortness of breath.   Cardiovascular: Negative for chest pain and leg swelling.  Gastrointestinal: Negative for abdominal distention, abdominal pain, blood in stool, constipation, diarrhea and nausea.  Endocrine: Negative for hot flashes.  Genitourinary: Negative for difficulty urinating, dysuria and frequency.   Musculoskeletal: Positive for myalgias. Negative for arthralgias and gait problem.  Skin: Negative for itching, rash and wound.  Neurological: Negative for dizziness, extremity weakness, gait problem and headaches.       Chronic numbness and tingling of fingertips and toes.  Hematological: Negative for adenopathy. Does not bruise/bleed easily.  Psychiatric/Behavioral: Negative for confusion, decreased concentration and depression. The patient is not nervous/anxious.       MEDICAL HISTORY: Past Medical History:  Diagnosis Date  . Breast cancer, left ([place]) 2004   Lumpectomy and rad tx's.  . Chronic back pain   . Colon cancer ([place]) 2018  . COPD (chronic obstructive pulmonary disease) ([place])   .  Degenerative disc disease, lumbar 04/2013  . Family history of adverse reaction to anesthesia    son arrested after anesthesia  . GERD (gastroesophageal reflux disease)   . Hypertension   . Iron deficiency anemia 05/27/2017  . Personal history of chemotherapy 2018   Colon  . Personal history of radiation therapy    Breast 2004  . Personal history of radiation therapy 2018   Colon  . Postprocedural intraabdominal abscess 09/01/2018  . Small bowel obstruction ([place]) 04/16/2017  . Wears dentures    full upper    SURGICAL HISTORY: Past Surgical History:  Procedure Laterality Date  . APPENDECTOMY  1965  . BREAST EXCISIONAL BIOPSY Left 08/01/2003   lumpectomy rad 11/04-2/28/2005  . BREAST SURGERY    . CESAREAN SECTION     x3  . COLON SURGERY    . COLONOSCOPY W/ POLYPECTOMY    . COLONOSCOPY WITH PROPOFOL N/A 04/09/2018   Procedure: COLONOSCOPY WITH PROPOFOL;  Surgeon: Virgel Manifold, MD;  Location: [facility];  Service: Gastroenterology;  Laterality: N/A;  . COLONOSCOPY WITH PROPOFOL N/A 08/13/2019   Procedure: COLONOSCOPY WITH PROPOFOL;  Surgeon: Virgel Manifold, MD;  Location: [place];  Service: Endoscopy;  Laterality: N/A;  . COLOSTOMY TAKEDOWN N/A 06/25/2018   Procedure: COLOSTOMY TAKEDOWN;  Surgeon: Jules Husbands, MD;  Location: [facility];  Service: General;  Laterality: N/A;  . ESOPHAGOGASTRODUODENOSCOPY (EGD) WITH PROPOFOL N/A 04/04/2017   Procedure: ESOPHAGOGASTRODUODENOSCOPY (EGD) WITH PROPOFOL;  Surgeon: [name], MD;  Location: [place];  Service: Endoscopy;  Laterality: N/A;  . FRACTURE SURGERY    . HIP FRACTURE SURGERY Left 01/25/2012  . LAPAROSCOPY N/A 12/07/2019   Procedure: LAPAROSCOPY DIAGNOSTIC;  Surgeon: Jules Husbands, MD;  Location: [facility];  Service: General;  Laterality: N/A;  . LAPAROTOMY N/A 04/15/2017   Procedure: EXPLORATORY LAPAROTOMY;  Surgeon: Clayburn Pert, MD;  Location: [facility];  Service: General;  Laterality: N/A;   . NECK SURGERY  12/2011  . PARASTOMAL HERNIA REPAIR N/A 12/03/2017   Procedure: HERNIA REPAIR PARASTOMAL;  Surgeon: Jules Husbands, MD;  Location: [facility];  Service: General;  Laterality: N/A;  . PARASTOMAL HERNIA REPAIR N/A 06/25/2018   Procedure: HERNIA REPAIR PARASTOMAL;  Surgeon: Jules Husbands, MD;  Location: [facility];  Service: General;  Laterality: N/A;  . PORTACATH PLACEMENT Right 05/21/2017   Procedure: INSERTION PORT-A-CATH;  Surgeon: Clayburn Pert, MD;  Location: [facility];  Service: General;  Laterality: Right;  . PORTACATH PLACEMENT Right 12/07/2019   Procedure: INSERTION PORT-A-CATH;  Surgeon: Jules Husbands, MD;  Location: [facility];  Service: General;  Laterality: Right;  . WRIST FRACTURE SURGERY      SOCIAL HISTORY: Social History   Socioeconomic History  . Marital status: Divorced    Spouse name: Not on file  . Number of children: 3  . Years of education: Not on file  . Highest education level: 7th grade  Occupational History  . Occupation: retired  Tobacco Use  . Smoking status: Former Smoker    Packs/day: 0.25    Years: 55.00    Pack years: 13.75    Types: Cigarettes    Quit date: 05/21/2017    Years since quitting: 2.6  . Smokeless tobacco: Never Used  . Tobacco comment: started age 72 1/2 to 1 ppd  Substance and Sexual Activity  . Alcohol use: Yes    Alcohol/week: 0.0 standard drinks    Comment: occasionally drinks beer / 1 month  . Drug use: No  . Sexual activity: Never  Other Topics Concern  . Not on file  Social History Narrative   Lives at home alone   Social Determinants of Health   Financial Resource Strain: Low Risk   . Difficulty of Paying Living Expenses: Not hard at all  Food Insecurity: No Food Insecurity  . Worried About Charity fundraiser in the Last Year: Never true  . Ran Out of Food in the Last Year: Never true  Transportation Needs: No Transportation Needs  . Lack of Transportation (Medical): No  . Lack of Transportation  (Non-Medical): No  Physical Activity: Inactive  . Days of Exercise per Week: 0 days  . Minutes of Exercise per Session: 0 min  Stress: No Stress Concern Present  . Feeling of Stress : Not at all  Social Connections: Moderately Isolated  . Frequency of Communication with Friends and Family: More than three times a week  . Frequency of Social Gatherings with Friends and Family: Once a week  . Attends Religious Services: Never  . Active Member of Clubs or Organizations: No  . Attends Archivist Meetings: Never  . Marital Status: Divorced  Human resources officer Violence: Not At Risk  . Fear of Current or Ex-Partner: No  . Emotionally Abused: No  . Physically Abused: No  . Sexually Abused: No    FAMILY HISTORY Family History  Problem Relation Age of Onset  . Diabetes Mother        type 2  . Coronary artery disease Mother   . Deep vein thrombosis Mother   . Colon cancer Father   . Asthma Brother   . Diabetes Brother        type 2  . Breast cancer Neg Hx  ALLERGIES:  is allergic to erbitux [cetuximab]; betadine [povidone iodine]; iodine; other; and sinus formula [cholestatin].  MEDICATIONS:  Current Outpatient Medications  Medication Sig Dispense Refill  . apixaban (ELIQUIS) 5 MG TABS tablet Take 1 tablet (5 mg total) by mouth 2 (two) times daily. 60 tablet 3  . budesonide-formoterol (SYMBICORT) 80-4.5 MCG/ACT inhaler Inhale 2 puffs into the lungs 2 (two) times daily. 3 Inhaler 3  . calcium citrate-vitamin D (CITRACAL+D) 315-200 MG-UNIT tablet Take 1 tablet by mouth daily.    . Cholecalciferol (VITAMIN D3) 50 MCG (2000 UT) TABS Take 2,000 Units by mouth daily.    . diphenhydrAMINE (BENADRYL) 25 MG tablet Take 2 tablets (50 mg total) by mouth at bedtime as needed for itching or allergies. 30 tablet 0  . encorafenib (BRAFTOVI) 75 MG capsule Take 150 mg by mouth daily.    Marland Kitchen EPINEPHrine 0.3 mg/0.3 mL IJ SOAJ injection Inject 0.3 mLs (0.3 mg total) into the muscle as needed  for anaphylaxis. 1 each 0  . gabapentin (NEURONTIN) 400 MG capsule TAKE 1 CAPSULE THREE TIMES DAILY (Patient taking differently: Take 400 mg by mouth 2 (two) times daily. ) 270 capsule 1  . HYDROcodone-acetaminophen (NORCO) 7.5-325 MG tablet Take 1-2 tablets by mouth every 6 (six) hours as needed for moderate pain. 120 tablet 0  . meloxicam (MOBIC) 15 MG tablet TAKE 1 TABLET EVERY DAY AS NEEDED (Patient taking differently: Take 15 mg by mouth at bedtime. ) 90 tablet 3  . metoprolol succinate (TOPROL-XL) 100 MG 24 hr tablet Take 1 tablet (100 mg total) by mouth daily. Take with or immediately following a meal. 30 tablet 3  . Multiple Vitamins-Minerals (WOMENS MULTI VITAMIN & MINERAL PO) Take 2 tablets by mouth daily.     Marland Kitchen omeprazole (PRILOSEC) 20 MG capsule Take 1 capsule (20 mg total) by mouth daily. 90 capsule 4  . pravastatin (PRAVACHOL) 40 MG tablet TAKE 1 TABLET EVERY DAY (Patient taking differently: Take 40 mg by mouth daily. ) 90 tablet 4  . Probiotic Product (PROBIOTIC DAILY PO) Take 1 capsule by mouth daily.     Marland Kitchen senna-docusate (SENOKOT-S) 8.6-50 MG tablet Take 2 tablets by mouth daily. 60 tablet 1  . furosemide (LASIX) 20 MG tablet Take 1 tablet (20 mg total) by mouth daily as needed. 30 tablet 0  . metoprolol tartrate (LOPRESSOR) 50 MG tablet Take 1 tablet (50 mg total) by mouth 2 (two) times daily. (Patient not taking: Reported on 01/21/2020) 60 tablet 0  . oxyCODONE-acetaminophen (PERCOCET) 5-325 MG tablet Take 1 tablet by mouth every 4 (four) hours as needed for severe pain. 15 tablet 0   No current facility-administered medications for this visit.    PHYSICAL EXAMINATION:  ECOG PERFORMANCE STATUS: 1 - Symptomatic but completely ambulatory  Vitals:   01/21/20 0843  BP: 118/73  Pulse: 73  Resp: 18  Temp: (!) 97.3 F (36.3 C)   Filed Weights   01/21/20 0843  Weight: 158 lb 3.2 oz (71.8 kg)    Physical Exam  Constitutional: She is oriented to person, place, and time. No  distress.  HENT:  Head: Normocephalic and atraumatic.  Nose: Nose normal.  Mouth/Throat: Oropharynx is clear and moist. No oropharyngeal exudate.  Eyes: Pupils are equal, round, and reactive to light. EOM are normal. No scleral icterus.  Cardiovascular: Normal rate and regular rhythm.  No murmur heard. Pulmonary/Chest: Effort normal. No respiratory distress. She has no rales. She exhibits no tenderness.  Abdominal: Soft. She exhibits no distension.  There is no abdominal tenderness.  Palpable abdominal wall mass,    Musculoskeletal:        General: No edema. Normal range of motion.     Cervical back: Normal range of motion and neck supple.  Neurological: She is alert and oriented to person, place, and time. No cranial nerve deficit. She exhibits normal muscle tone. Coordination normal.  Skin: Skin is warm and dry. She is not diaphoretic. No erythema.  Left chest wall Mediport  Psychiatric: Affect normal.      LABORATORY DATA: I have personally reviewed the data as listed: CBC Latest Ref Rng & Units 01/22/2020 01/21/2020 01/20/2020  WBC 4.0 - 10.5 K/uL 9.7 11.6(H) 10.3  Hemoglobin 12.0 - 15.0 g/dL 11.9(L) 11.8(L) 12.0  Hematocrit 36.0 - 46.0 % 38.2 37.4 38.4  Platelets 150 - 400 K/uL 405(H) 348 351   CMP Latest Ref Rng & Units 01/22/2020 01/21/2020 01/20/2020  Glucose 70 - 99 mg/dL 96 173(H) 178(H)  BUN 8 - 23 mg/dL _0 Creatinine 0.44 - 1.00 mg/dL 0.69 0.77 0.85  Sodium 135 - 145 mmol/L 135 138 137  Potassium 3.5 - 5.1 mmol/L 3.5 3.4(L) 3.7  Chloride 98 - 111 mmol/L 100 102 102  CO2 22 - 32 mmol/L _1 Calcium 8.9 - 10.3 mg/dL 8.0(L) 8.1(L) 8.4(L)  Total Protein 6.5 - 8.1 g/dL - 5.9(L) 6.2(L)  Total Bilirubin 0.3 - 1.2 mg/dL - 0.3 0.4  Alkaline Phos 38 - 126 U/L - 73 78  AST 15 - 41 U/L - 11(L) 9(L)  ALT 0 - 44 U/L - 10 11   RADIOGRAPHIC STUDIES: I have personally reviewed the radiological images as listed and agreed with the findings in the report. CT ABDOMEN  PELVIS WO CONTRAST  Addendum Date: 12/08/2019   ADDENDUM REPORT: 12/08/2019 16:58 ADDENDUM: Upon further review, 11 mm irregular nodular density is seen adjacent to the colonic surgical anastomosis, but may be slightly enlarged compared to prior exam. There is also noted 18 mm nodular density in anterior abdominal wall centrally which may be slightly enlarged compared to prior exam. Etiology of these is unknown, but possible malignancy or metastatic disease cannot be excluded. PET scan is recommended for further evaluation. Bilateral adrenal masses are noted most consistent with adenomas. Mild pneumoperitoneum is noted consistent with recent surgery. Aortic Atherosclerosis (ICD10-I70.0). Electronically Signed   By: Marijo Conception M.D.   On: 12/08/2019 16:58   Result Date: 12/08/2019 CLINICAL DATA:  Acute generalized abdominal pain. EXAM: CT ABDOMEN AND PELVIS WITHOUT CONTRAST TECHNIQUE: Multidetector CT imaging of the abdomen and pelvis was performed following the standard protocol without IV contrast. COMPARISON:  June 21, 2019. FINDINGS: Lower chest: Minimal right posterior basilar subsegmental atelectasis is noted. Hepatobiliary: No focal liver abnormality is seen. No gallstones, gallbladder wall thickening, or biliary dilatation. Pancreas: Unremarkable. No pancreatic ductal dilatation or surrounding inflammatory changes. Spleen: Normal in size without focal abnormality. Adrenals/Urinary Tract: Stable bilateral adrenal masses are noted most consistent with adenomas. No hydronephrosis or renal obstruction is noted. No renal or ureteral calculi are noted. Urinary bladder is unremarkable. Stomach/Bowel: Status post right hemicolectomy. The stomach is unremarkable. There is no evidence of bowel obstruction or inflammation. Vascular/Lymphatic: Aortic atherosclerosis. No enlarged abdominal or pelvic lymph nodes. Reproductive: Uterus and bilateral adnexa are unremarkable. Other: Mild pneumoperitoneum is noted  consistent with recent surgery. No ascites is noted. Musculoskeletal: No acute or significant osseous findings. Electronically Signed: By: Marijo Conception M.D. On: 12/08/2019 13:14  NM Pulmonary Perf and Vent  Result Date: 12/09/2019 CLINICAL DATA:  Positive D-dimer study. History of breast and colon carcinoma. COPD EXAM: NUCLEAR MEDICINE VENTILATION - PERFUSION LUNG SCAN VIEWS: Anterior, posterior, left lateral, right lateral, RPO, RAO, LPO, LAO-ventilation and perfusion RADIOPHARMACEUTICALS:  30.52 mCi of Tc-61mDTPA aerosol inhalation and 3.86 mCi Tc965mAA IV COMPARISON:  Chest radiograph December 08, 2019 FINDINGS: Ventilation: Radiotracer uptake on the ventilation study is homogeneous and symmetric bilaterally. No appreciable ventilation defects. Perfusion: Radiotracer uptake on the perfusion study is homogeneous and symmetric bilaterally. No appreciable perfusion defects. IMPRESSION: No appreciable ventilation or perfusion defects. Very low probability of pulmonary embolus. Electronically Signed   By: WiLowella GripII M.D.   On: 12/09/2019 14:14   DG Chest Portable 1 View  Result Date: 01/10/2020 CLINICAL DATA:  Shortness of breath, chest pain, symptoms since Saturday, history colon cancer, melanoma EXAM: PORTABLE CHEST 1 VIEW COMPARISON:  Portable exam 1347 hours compared to 12/24/2019 FINDINGS: RIGHT jugular Port-A-Cath with tip projecting over SVC. Upper normal heart size. Mediastinal contours and pulmonary vascularity normal. Atherosclerotic calcification aorta. Minimal bibasilar atelectasis. Lungs otherwise clear. No infiltrate, pleural effusion or pneumothorax. Bones demineralized. IMPRESSION: Minimal bibasilar atelectasis. Electronically Signed   By: MaLavonia Dana.D.   On: 01/10/2020 13:54   DG Chest Portable 1 View  Result Date: 12/24/2019 CLINICAL DATA:  Shortness of breath and urticaria EXAM: PORTABLE CHEST 1 VIEW COMPARISON:  December 08, 2019 FINDINGS: Port-A-Cath tip is in the  superior vena cava. No pneumothorax. The lungs are clear. Heart is upper normal in size with pulmonary vascularity normal. No adenopathy. There is aortic atherosclerosis. No bone lesions. IMPRESSION: Stable Port-A-Cath position. No pneumothorax. No edema or airspace opacity. Stable cardiac silhouette. No adenopathy. Aortic Atherosclerosis (ICD10-I70.0). Electronically Signed   By: WiLowella GripII M.D.   On: 12/24/2019 11:45   DG Chest Port 1 View  Result Date: 12/08/2019 CLINICAL DATA:  Left lower quadrant abdominal pain. Surgical procedure yesterday. Recent port placement. EXAM: PORTABLE CHEST 1 VIEW COMPARISON:  CT same day. Chest radiography yesterday. FINDINGS: Power port inserted from a right internal jugular approach. Tip at the SVC RA junction. No pneumothorax. Mild bibasilar atelectasis. Small amount of pneumoperitoneum as shown on the previous CT consistent with yesterday's procedure. IMPRESSION: Mild bibasilar atelectasis. Power port tip at the SVC RA junction. No pneumothorax. Small amount of pneumoperitoneum as shown on the previous CT. Electronically Signed   By: MaNelson Chimes.D.   On: 12/08/2019 16:48   DG CHEST PORT 1 VIEW  Result Date: 12/07/2019 CLINICAL DATA:  Status post Port-A-Cath placement. EXAM: PORTABLE CHEST 1 VIEW COMPARISON:  October 05, 2019. FINDINGS: Stable cardiomediastinal silhouette. No pneumothorax pleural effusion is noted. Mild bibasilar subsegmental atelectasis or scarring is noted. Interval placement of right internal jugular Port-A-Cath with distal tip in expected position of SVC. Atherosclerosis of thoracic aorta is noted. Bony thorax is unremarkable. IMPRESSION: Interval placement of right internal jugular Port-A-Cath with distal tip in expected position of the SVC. No pneumothorax is noted. Electronically Signed   By: JaMarijo Conception.D.   On: 12/07/2019 10:28   DG C-Arm 1-60 Min-No Report  Result Date: 12/07/2019 Fluoroscopy was utilized by the requesting  physician.  No radiographic interpretation.   ECHOCARDIOGRAM COMPLETE  Result Date: 12/09/2019    ECHOCARDIOGRAM REPORT   [facility] 12/09/2019 Medical Rec #:  [id number]   Height:  64.0 in Accession #:    [id number]    Weight:       158.8 lb Date of Birth:  04-Oct-1945     BSA:          1.774 m Patient Age:    66 years      BP:           142/78 mmHg Patient Gender: F             HR:           62 bpm. Exam Location:  [facility] Procedure: 2D Echo, Cardiac Doppler and Color Doppler Indications:    Atrial Fibrillation 427.31  History:        Patient has no prior history of Echocardiogram examinations.                 COPD.  Sonographer:    Sherrie Sport RDCS (AE) Referring Phys: [id number] [facility] Diagnosing      Serafina Royals MD Phys: IMPRESSIONS  1. Left ventricular ejection fraction, by estimation, is 50 to 55%. The left ventricle has low normal function. The left ventricle has no regional wall motion abnormalities. Left ventricular diastolic parameters were normal.  2. Right ventricular systolic function is normal. The right ventricular size is normal.  3. The mitral valve is normal in structure. Trivial mitral valve regurgitation.  4. The aortic valve is normal in structure. Aortic valve regurgitation is not visualized. FINDINGS  Left Ventricle: Left ventricular ejection fraction, by estimation, is 50 to 55%. The left ventricle has low normal function. The left ventricle has no regional wall motion abnormalities. The left ventricular internal cavity size was normal in size. There is no left ventricular hypertrophy. Left ventricular diastolic parameters were normal. Right Ventricle: The right ventricular size is normal. No increase in right ventricular wall thickness. Right ventricular systolic function is normal. Left Atrium: Left atrial size was normal in size. Right Atrium: Right atrial size was normal in size. Pericardium: There is no evidence of pericardial effusion. Mitral  Valve: The mitral valve is normal in structure. Trivial mitral valve regurgitation. Tricuspid Valve: The tricuspid valve is normal in structure. Tricuspid valve regurgitation is trivial. Aortic Valve: The aortic valve is normal in structure. Aortic valve regurgitation is not visualized. Aortic valve mean gradient measures 3.5 mmHg. Aortic valve peak gradient measures 6.0 mmHg. Aortic valve area, by VTI measures 2.29 cm. Pulmonic Valve: The pulmonic valve was normal in structure. Pulmonic valve regurgitation is not visualized. Aorta: The aortic root and ascending aorta are structurally normal, with no evidence of dilitation. IAS/Shunts: No atrial level shunt detected by color flow Doppler.  LEFT VENTRICLE PLAX 2D LVIDd:         4.08 cm  Diastology LVIDs:         2.85 cm  LV e' lateral:   9.36 cm/s LV PW:         1.18 cm  LV E/e' lateral: 11.5 LV IVS:        0.92 cm  LV e' medial:    8.81 cm/s LVOT diam:     2.00 cm  LV E/e' medial:  12.3 LV SV:         56 LV SV Index:   32 LVOT Area:     3.14 cm  RIGHT VENTRICLE RV Basal diam:  4.02 cm LEFT ATRIUM             Index       RIGHT ATRIUM  Index LA diam:        4.40 cm 2.48 cm/m  RA Area:     21.80 cm LA Vol (A2C):   81.2 ml 45.78 ml/m RA Volume:   68.60 ml  38.68 ml/m LA Vol (A4C):   71.0 ml 40.03 ml/m LA Biplane Vol: 75.0 ml 42.29 ml/m  AORTIC VALVE                   PULMONIC VALVE AV Area (Vmax):    2.21 cm    PV Vmax:        0.78 m/s AV Area (Vmean):   2.10 cm    PV Peak grad:   2.4 mmHg AV Area (VTI):     2.29 cm    RVOT Peak grad: 3 mmHg AV Vmax:           122.00 cm/s AV Vmean:          83.250 cm/s AV VTI:            0.244 m AV Peak Grad:      6.0 mmHg AV Mean Grad:      3.5 mmHg LVOT Vmax:         85.70 cm/s LVOT Vmean:        55.700 cm/s LVOT VTI:          0.178 m LVOT/AV VTI ratio: 0.73  AORTA Ao Root diam: 2.60 cm MITRAL VALVE MV Area (PHT): 3.37 cm     SHUNTS MV Decel Time: 225 msec     Systemic VTI:  0.18 m MV E velocity: 108.00 cm/s   Systemic Diam: 2.00 cm MV A velocity: 62.10 cm/s MV E/A ratio:  1.74 Serafina Royals MD Electronically signed by Serafina Royals MD Signature Date/Time: 12/09/2019/1:47:20 PM    Final    [place] CORE BIOPSY (SOFT TISSUE)  Result Date: 10/28/2019 CLINICAL DATA:  History of colon carcinoma and status post transverse colectomy and prior treatment with chemotherapy and radiation therapy. Recent imaging has demonstrated new omental, peritoneal and abdominal wall soft tissue nodules which are hypermetabolic on PET scan and suspicious for recurrent disease. EXAM: ULTRASOUND GUIDED CORE BIOPSY OF ABDOMINAL WALL MASS MEDICATIONS: 1.0 mg IV Versed; 50 mcg IV Fentanyl Total Moderate Sedation Time: 12 minutes. The patient's level of consciousness and physiologic status were continuously monitored during the procedure by Radiology nursing. PROCEDURE: The procedure, risks, benefits, and alternatives were explained to the patient. Questions regarding the procedure were encouraged and answered. The patient understands and consents to the procedure. A time out was performed prior to initiating the procedure. Ultrasound was performed of the abdominal wall and anterior peritoneal cavity. The anterior abdominal wall was prepped with chlorhexidine in a sterile fashion, and a sterile drape was applied covering the operative field. A sterile gown and sterile gloves were used for the procedure. Local anesthesia was provided with 1% Lidocaine. Core biopsy of an anterior abdominal wall mass was performed with an 18 gauge core biopsy device. Four separate core biopsy samples were obtained and submitted in formalin. COMPLICATIONS: None. FINDINGS: Irregular lobulated hypoechoic soft tissue mass is seen involving the right lower abdominal wall and corresponding to one of the lesions that demonstrated increased metabolic activity by PET scan. This area measures approximately 3.2 x 2.1 x 2.6 cm by ultrasound. Solid tissue was obtained with biopsy.  IMPRESSION: Ultrasound-guided core biopsy performed of a right lower abdominal wall mass measuring 3 cm in estimated maximal diameter. Electronically Signed   By: Eulas Post  Kathlene Cote M.D.   On: 10/28/2019 12:02      ASSESSMENT/PLAN 74yo female who has recurrent Colon Cancer presents for follow up  Cancer Staging Malignant neoplasm of colon [facility]) Staging form: Colon and Rectum, AJCC 8th Edition - Clinical stage from 04/15/2017: Stage IIIB (cT4a, cN1b, cM0) - Signed by Earlie Server, MD on 04/26/2017  1. Malignant neoplasm of transverse colon ([place])   2. Goals of care, counseling/discussion   3. Other fatigue    #Recurrent colon cancer with abdominal wall and mesentery nodule.    BRAF V600 E mutation and ATM mutation. Labs are reviewed and discussed with patient. s/p cycle 1 panitumumab.  She has been started on dose reduced encorafenib 150 mg daily for 4 days. Patient reports multiple issues including muscle pain, swelling, fatigue and weakness. She is not willing to proceed with any additional treatments. Goal of care was discussed with patient.  Patient prefers to focus on quality of life and to let the disease take natural course.  I recommend patient to make a follow-up appointment with palliative care.  #Hypomagnesia, magnesium level is 1.6.  I recommend patient to be started on Slow-Mag 1 tablet daily for a week. # History of breast cancer, need mammogram in March 2021 # Parotid nodule with hypermetabolism.  Her treatment for colon cancer takes priority.   #Continue port flush. Since she is not interested in proceeding with any treatment with colon cancer at this point.  I see no value of further ENT evaluation of parotid nodule.  We spent sufficient time to discuss many aspect of care, questions were answered to patient's satisfaction.  Earlie Server, MD, PhD Hematology [facility] Pager- [phone] 01/22/20

## 2020-01-22 NOTE — Progress Notes (Signed)
[facility] up  Visit:  Patient Care Team: Birdie Sons, MD as PCP - General (Family Medicine) Earlie Server, MD as Consulting Physician (Oncology) Noreene Filbert, MD as Referring Physician (Radiation Oncology) Jules Husbands, MD as Consulting Physician (General Surgery) Corey Skains, MD as Consulting Physician (Cardiology)  PURPOSE OF VISIT: Stage IV colon cancer  HISTORY OF PRESENTING ILLNESS: Kimberly [name] 74 y.o. female with PMH listed as below presents for follow up for the management of Stage IIIB Colon Cancer Patient reports a remote history of breast cancer s/p lumpectomy and radiation treatments.   Patient had been having abdominal pain, weight loss and bloating.  CT scan showed partial obstruction at the level of transverse colon and ileocolic mesenteric adenopathy. She was prepared for a colonoscopy on 04/15/2017. She started to have worsened abdominal pain, nausea vomiting, unable to maintain oral intake and was sent to ED. She was found to have bowel obstruction and underwent  exploratory laparotomy with transverse colectomy, with end colostomy and mucous fistula formation for obstructing colon lesion. Differential diagnosis prior to surgery was metastatic breast cancer in colon vs colon cancer. The patient did have a colonoscopy 2 years ago without the mass being present at that time but did have 2 small polyps in the transverse colon that were removed.  04/15/2017 Patient underwent transverse colon resection.  Pathology showed T4aN1 moderate differentiated adenocarcinoma, Grade 2, tumor invades visceral peritoneum, LVI present, negative margin, 3/16 lymph nodes involved with cancer. Low probability of MSI-H.   # Genetic test INVITAE came back negative. No pathogenetic sequence variants or deletions/duplications identified. Results was scanned to [facility] (a copy of the report was provided to patient and her daughter)  # # baseline CEA is 2.1 #  colostomy reversal on 06/25/2018, subsequently developed C. difficile colitis x 2.  Multiple abdominal CT abdomen and pelvis for obtained, not listed here #09/01/2018 CT abdomen pelvis showed abdominal wall abscess,  percutaneous drain placement .treatment with antibiotics with Augmentin 10 days course.  #09/14/2018 CT abdomen/pelvis showed resolution of abscess, no intraabdominal abscess.  Mediport was discontinued. # 08/13/2019 colonoscopy showed localized area of mildly thickened folds of the mucosa was found in the sigmoid colon. Biopsy pathology showed benign mucosa with lymphoid aggregates and focal minimal inflammation. No dysplasia.   TREATMENT:  04/15/2017 transverse colon resection 05/27/2017- 11/17/2017 adjuvant FOLFOX, Oxaliplatin was discontinued after 3 cycles due to worsening of pre-existing neuropathy. Finished another 9 cycles of 5-FU.  March -April 2019 adjuvant RT.  06/25/2018 Colostomy Reversal.   09/21/2019 PET scan showed 2 small right abdominal mesenteric soft tissue nodule which showed no significant change in size compared to recent CT but hypermetabolic.  Peritoneal metastatic disease cannot be excluded. 1.1 cm hypermetabolic nodule in the right parotid gland suspicious for primary parotid neoplasm.  08/27/2020, CT-guided biopsy of abdominal wall mass was obtained. Results showed metastatic adenocarcinoma, compatible with colon primary.  # 12/07/2019 She underwent surgery for laparoscopic evaluation of peritoneum and placement of Mediport. # 12/08/2019- 12/10/2019  She was admitted due to atrial fibrillation.  Patient was started on anticoagulation with Eliquis 5 mg twice daily.  Patient was reverted back to normal sinus rhythm.  She is on metoprolol for rate control.  # 12/24/2019 She had anaphylactic reaction to Cetuximab She got epinephrine. Was not intubated.   INTERVAL HISTORY 74 y.o. female  presents for follow-up of recurrent colon cancer. Patient has been on  Panitumumab status post 1 treatment and Encorafenib 150  mg daily since 3 days ago. Patient called and requested acute visit for evaluation.  She reports feeling swollen feet, hands and facial swelling.  Also feels weakness.  Denies any trouble breathing, shortness of breath wheezing. Also mild pain in her chest.     Review of Systems  Constitutional: Positive for fatigue. Negative for appetite change, chills, fever and unexpected weight change.  HENT:   Negative for hearing loss, lump/mass, nosebleeds and voice change.        Facial swelling  Eyes: Negative for eye problems.  Respiratory: Negative for chest tightness, cough and shortness of breath.   Cardiovascular: Positive for leg swelling. Negative for chest pain.  Gastrointestinal: Negative for abdominal distention, abdominal pain, blood in stool, constipation, diarrhea and nausea.  Endocrine: Negative for hot flashes.  Genitourinary: Negative for difficulty urinating, dysuria and frequency.   Musculoskeletal: Negative for arthralgias, gait problem and myalgias.  Skin: Negative for itching, rash and wound.  Neurological: Negative for dizziness, extremity weakness, gait problem and headaches.       Chronic numbness and tingling of fingertips and toes.  Hematological: Negative for adenopathy. Does not bruise/bleed easily.  Psychiatric/Behavioral: Negative for confusion, decreased concentration and depression. The patient is not nervous/anxious.       MEDICAL HISTORY: Past Medical History:  Diagnosis Date  . Breast cancer, left ([place]) 2004   Lumpectomy and rad tx's.  . Chronic back pain   . Colon cancer ([place]) 2018  . COPD (chronic obstructive pulmonary disease) ([place])   . Degenerative disc disease, lumbar 04/2013  . Family history of adverse reaction to anesthesia    son arrested after anesthesia  . GERD (gastroesophageal reflux disease)   . Hypertension   . Iron deficiency anemia 05/27/2017  . Personal history of chemotherapy  2018   Colon  . Personal history of radiation therapy    Breast 2004  . Personal history of radiation therapy 2018   Colon  . Postprocedural intraabdominal abscess 09/01/2018  . Small bowel obstruction ([place]) 04/16/2017  . Wears dentures    full upper    SURGICAL HISTORY: Past Surgical History:  Procedure Laterality Date  . APPENDECTOMY  1965  . BREAST EXCISIONAL BIOPSY Left 08/01/2003   lumpectomy rad 11/04-2/28/2005  . BREAST SURGERY    . CESAREAN SECTION     x3  . COLON SURGERY    . COLONOSCOPY W/ POLYPECTOMY    . COLONOSCOPY WITH PROPOFOL N/A 04/09/2018   Procedure: COLONOSCOPY WITH PROPOFOL;  Surgeon: Virgel Manifold, MD;  Location: [facility];  Service: Gastroenterology;  Laterality: N/A;  . COLONOSCOPY WITH PROPOFOL N/A 08/13/2019   Procedure: COLONOSCOPY WITH PROPOFOL;  Surgeon: Virgel Manifold, MD;  Location: [facility];  Service: Endoscopy;  Laterality: N/A;  . COLOSTOMY TAKEDOWN N/A 06/25/2018   Procedure: COLOSTOMY TAKEDOWN;  Surgeon: Jules Husbands, MD;  Location: [facility];  Service: General;  Laterality: N/A;  . ESOPHAGOGASTRODUODENOSCOPY (EGD) WITH PROPOFOL N/A 04/04/2017   Procedure: ESOPHAGOGASTRODUODENOSCOPY (EGD) WITH PROPOFOL;  Surgeon: [name], MD;  Location: [facility];  Service: Endoscopy;  Laterality: N/A;  . FRACTURE SURGERY    . HIP FRACTURE SURGERY Left 01/25/2012  . LAPAROSCOPY N/A 12/07/2019   Procedure: LAPAROSCOPY DIAGNOSTIC;  Surgeon: Jules Husbands, MD;  Location: [facility];  Service: General;  Laterality: N/A;  . LAPAROTOMY N/A 04/15/2017   Procedure: EXPLORATORY LAPAROTOMY;  Surgeon: Clayburn Pert, MD;  Location: [facility];  Service: General;  Laterality: N/A;  . NECK SURGERY  12/2011  .  PARASTOMAL HERNIA REPAIR N/A 12/03/2017   Procedure: HERNIA REPAIR PARASTOMAL;  Surgeon: Jules Husbands, MD;  Location: [facility];  Service: General;  Laterality: N/A;  . PARASTOMAL HERNIA REPAIR N/A 06/25/2018   Procedure: HERNIA  REPAIR PARASTOMAL;  Surgeon: Jules Husbands, MD;  Location: [facility];  Service: General;  Laterality: N/A;  . PORTACATH PLACEMENT Right 05/21/2017   Procedure: INSERTION PORT-A-CATH;  Surgeon: Clayburn Pert, MD;  Location: [facility];  Service: General;  Laterality: Right;  . PORTACATH PLACEMENT Right 12/07/2019   Procedure: INSERTION PORT-A-CATH;  Surgeon: Jules Husbands, MD;  Location: [facility];  Service: General;  Laterality: Right;  . WRIST FRACTURE SURGERY      SOCIAL HISTORY: Social History   Socioeconomic History  . Marital status: Divorced    Spouse name: Not on file  . Number of children: 3  . Years of education: Not on file  . Highest education level: 7th grade  Occupational History  . Occupation: retired  Tobacco Use  . Smoking status: Former Smoker    Packs/day: 0.25    Years: 55.00    Pack years: 13.75    Types: Cigarettes    Quit date: 05/21/2017    Years since quitting: 2.6  . Smokeless tobacco: Never Used  . Tobacco comment: started age 71 1[age]2 to 1 ppd  Substance and Sexual Activity  . Alcohol use: Yes    Alcohol/week: 0.0 standard drinks    Comment: occasionally drinks beer / 1 month  . Drug use: No  . Sexual activity: Never  Other Topics Concern  . Not on file  Social History Narrative   Lives at home alone   Social Determinants of Health   Financial Resource Strain: Low Risk   . Difficulty of Paying Living Expenses: Not hard at all  Food Insecurity: No Food Insecurity  . Worried About Charity fundraiser in the Last Year: Never true  . Ran Out of Food in the Last Year: Never true  Transportation Needs: No Transportation Needs  . Lack of Transportation (Medical): No  . Lack of Transportation (Non-Medical): No  Physical Activity: Inactive  . Days of Exercise per Week: 0 days  . Minutes of Exercise per Session: 0 min  Stress: No Stress Concern Present  . Feeling of Stress : Not at all  Social Connections: Moderately Isolated  . Frequency of  Communication with Friends and Family: More than three times a week  . Frequency of Social Gatherings with Friends and Family: Once a week  . Attends Religious Services: Never  . Active Member of Clubs or Organizations: No  . Attends Archivist Meetings: Never  . Marital Status: Divorced  Human resources officer Violence: Not At Risk  . Fear of Current or Ex-Partner: No  . Emotionally Abused: No  . Physically Abused: No  . Sexually Abused: No    FAMILY HISTORY Family History  Problem Relation Age of Onset  . Diabetes Mother        type 2  . Coronary artery disease Mother   . Deep vein thrombosis Mother   . Colon cancer Father   . Asthma Brother   . Diabetes Brother        type 2  . Breast cancer Neg Hx     ALLERGIES:  is allergic to erbitux [cetuximab]; betadine [povidone iodine]; iodine; other; and sinus formula [cholestatin].  MEDICATIONS:  Current Outpatient Medications  Medication Sig Dispense Refill  . apixaban (ELIQUIS) 5 MG TABS tablet  Take 1 tablet (5 mg total) by mouth 2 (two) times daily. 60 tablet 3  . budesonide-formoterol (SYMBICORT) 80-4.5 MCG/ACT inhaler Inhale 2 puffs into the lungs 2 (two) times daily. 3 Inhaler 3  . calcium citrate-vitamin D (CITRACAL+D) 315-200 MG-UNIT tablet Take 1 tablet by mouth daily.    . Cholecalciferol (VITAMIN D3) 50 MCG (2000 UT) TABS Take 2,000 Units by mouth daily.    . diphenhydrAMINE (BENADRYL) 25 MG tablet Take 2 tablets (50 mg total) by mouth at bedtime as needed for itching or allergies. 30 tablet 0  . encorafenib (BRAFTOVI) 75 MG capsule Take 150 mg by mouth daily.    Marland Kitchen EPINEPHrine 0.3 mg/0.3 mL IJ SOAJ injection Inject 0.3 mLs (0.3 mg total) into the muscle as needed for anaphylaxis. 1 each 0  . gabapentin (NEURONTIN) 400 MG capsule TAKE 1 CAPSULE THREE TIMES DAILY (Patient taking differently: Take 400 mg by mouth 2 (two) times daily. ) 270 capsule 1  . HYDROcodone-acetaminophen (NORCO) 7.5-325 MG tablet Take 1-2 tablets  by mouth every 6 (six) hours as needed for moderate pain. 120 tablet 0  . meloxicam (MOBIC) 15 MG tablet TAKE 1 TABLET EVERY DAY AS NEEDED (Patient taking differently: Take 15 mg by mouth at bedtime. ) 90 tablet 3  . Multiple Vitamins-Minerals (WOMENS MULTI VITAMIN & MINERAL PO) Take 2 tablets by mouth daily.     Marland Kitchen omeprazole (PRILOSEC) 20 MG capsule Take 1 capsule (20 mg total) by mouth daily. 90 capsule 4  . pravastatin (PRAVACHOL) 40 MG tablet TAKE 1 TABLET EVERY DAY (Patient taking differently: Take 40 mg by mouth daily. ) 90 tablet 4  . Probiotic Product (PROBIOTIC DAILY PO) Take 1 capsule by mouth daily.     Marland Kitchen senna-docusate (SENOKOT-S) 8.6-50 MG tablet Take 2 tablets by mouth daily. 60 tablet 1  . furosemide (LASIX) 20 MG tablet Take 1 tablet (20 mg total) by mouth daily as needed. 30 tablet 0  . metoprolol succinate (TOPROL-XL) 100 MG 24 hr tablet Take 1 tablet (100 mg total) by mouth daily. Take with or immediately following a meal. 30 tablet 3  . metoprolol tartrate (LOPRESSOR) 50 MG tablet Take 1 tablet (50 mg total) by mouth 2 (two) times daily. (Patient not taking: Reported on 01/21/2020) 60 tablet 0  . oxyCODONE-acetaminophen (PERCOCET) 5-325 MG tablet Take 1 tablet by mouth every 4 (four) hours as needed for severe pain. 15 tablet 0   No current facility-administered medications for this visit.    PHYSICAL EXAMINATION:  ECOG PERFORMANCE STATUS: 1 - Symptomatic but completely ambulatory  Vitals:   01/20/20 1320  BP: 123/77  Pulse: 72  Resp: 18  Temp: 97.6 F (36.4 C)  SpO2: 95%   Filed Weights   01/20/20 1320  Weight: 159 lb 1.6 oz (72.2 kg)    Physical Exam  Constitutional: She is oriented to person, place, and time. No distress.  HENT:  Head: Normocephalic and atraumatic.  Nose: Nose normal.  Mouth/Throat: Oropharynx is clear and moist. No oropharyngeal exudate.  Eyes: Pupils are equal, round, and reactive to light. EOM are normal. No scleral icterus.   Cardiovascular: Normal rate and regular rhythm.  No murmur heard. Pulmonary/Chest: Effort normal. No respiratory distress. She has no rales. She exhibits no tenderness.  Abdominal: Soft. She exhibits no distension. There is no abdominal tenderness.  Palpable abdominal wall mass,    Musculoskeletal:        General: No edema. Normal range of motion.     Cervical  back: Normal range of motion and neck supple.  Neurological: She is alert and oriented to person, place, and time.  Skin: Skin is warm and dry. She is not diaphoretic. No erythema.  Left chest wall Mediport  Psychiatric: Affect normal.      LABORATORY DATA: I have personally reviewed the data as listed: CBC Latest Ref Rng & Units 01/22/2020 01/21/2020 01/20/2020  WBC 4.0 - 10.5 K/uL 9.7 11.6(H) 10.3  Hemoglobin 12.0 - 15.0 g/dL 11.9(L) 11.8(L) 12.0  Hematocrit 36.0 - 46.0 % 38.2 37.4 38.4  Platelets 150 - 400 K/uL 405(H) 348 351   CMP Latest Ref Rng & Units 01/22/2020 01/21/2020 01/20/2020  Glucose 70 - 99 mg/dL 96 173(H) 178(H)  BUN 8 - 23 mg/dL '13 15 16  '$ Creatinine 0.44 - 1.00 mg/dL 0.69 0.77 0.85  Sodium 135 - 145 mmol/L 135 138 137  Potassium 3.5 - 5.1 mmol/L 3.5 3.4(L) 3.7  Chloride 98 - 111 mmol/L 100 102 102  CO2 22 - 32 mmol/L '27 25 26  '$ Calcium 8.9 - 10.3 mg/dL 8.0(L) 8.1(L) 8.4(L)  Total Protein 6.5 - 8.1 g/dL - 5.9(L) 6.2(L)  Total Bilirubin 0.3 - 1.2 mg/dL - 0.3 0.4  Alkaline Phos 38 - 126 U/L - 73 78  AST 15 - 41 U/L - 11(L) 9(L)  ALT 0 - 44 U/L - 10 11   RADIOGRAPHIC STUDIES: I have personally reviewed the radiological images as listed and agreed with the findings in the report. CT ABDOMEN PELVIS WO CONTRAST  Addendum Date: 12/08/2019   ADDENDUM REPORT: 12/08/2019 16:58 ADDENDUM: Upon further review, 11 mm irregular nodular density is seen adjacent to the colonic surgical anastomosis, but may be slightly enlarged compared to prior exam. There is also noted 18 mm nodular density in anterior abdominal wall  centrally which may be slightly enlarged compared to prior exam. Etiology of these is unknown, but possible malignancy or metastatic disease cannot be excluded. PET scan is recommended for further evaluation. Bilateral adrenal masses are noted most consistent with adenomas. Mild pneumoperitoneum is noted consistent with recent surgery. Aortic Atherosclerosis (ICD10-I70.0). Electronically Signed   By: Marijo Conception M.D.   On: 12/08/2019 16:58   Result Date: 12/08/2019 CLINICAL DATA:  Acute generalized abdominal pain. EXAM: CT ABDOMEN AND PELVIS WITHOUT CONTRAST TECHNIQUE: Multidetector CT imaging of the abdomen and pelvis was performed following the standard protocol without IV contrast. COMPARISON:  June 21, 2019. FINDINGS: Lower chest: Minimal right posterior basilar subsegmental atelectasis is noted. Hepatobiliary: No focal liver abnormality is seen. No gallstones, gallbladder wall thickening, or biliary dilatation. Pancreas: Unremarkable. No pancreatic ductal dilatation or surrounding inflammatory changes. Spleen: Normal in size without focal abnormality. Adrenals/Urinary Tract: Stable bilateral adrenal masses are noted most consistent with adenomas. No hydronephrosis or renal obstruction is noted. No renal or ureteral calculi are noted. Urinary bladder is unremarkable. Stomach/Bowel: Status post right hemicolectomy. The stomach is unremarkable. There is no evidence of bowel obstruction or inflammation. Vascular/Lymphatic: Aortic atherosclerosis. No enlarged abdominal or pelvic lymph nodes. Reproductive: Uterus and bilateral adnexa are unremarkable. Other: Mild pneumoperitoneum is noted consistent with recent surgery. No ascites is noted. Musculoskeletal: No acute or significant osseous findings. Electronically Signed: By: Marijo Conception M.D. On: 12/08/2019 13:14   NM Pulmonary Perf and Vent  Result Date: 12/09/2019 CLINICAL DATA:  Positive D-dimer study. History of breast and colon carcinoma. COPD  EXAM: NUCLEAR MEDICINE VENTILATION - PERFUSION LUNG SCAN VIEWS: Anterior, posterior, left lateral, right lateral, RPO, RAO, LPO, LAO-ventilation and  perfusion RADIOPHARMACEUTICALS:  30.52 mCi of Tc-70mDTPA aerosol inhalation and 3.86 mCi Tc930mAA IV COMPARISON:  Chest radiograph December 08, 2019 FINDINGS: Ventilation: Radiotracer uptake on the ventilation study is homogeneous and symmetric bilaterally. No appreciable ventilation defects. Perfusion: Radiotracer uptake on the perfusion study is homogeneous and symmetric bilaterally. No appreciable perfusion defects. IMPRESSION: No appreciable ventilation or perfusion defects. Very low probability of pulmonary embolus. Electronically Signed   By: WiLowella GripII M.D.   On: 12/09/2019 14:14   DG Chest Portable 1 View  Result Date: 01/10/2020 CLINICAL DATA:  Shortness of breath, chest pain, symptoms since Saturday, history colon cancer, melanoma EXAM: PORTABLE CHEST 1 VIEW COMPARISON:  Portable exam 1347 hours compared to 12/24/2019 FINDINGS: RIGHT jugular Port-A-Cath with tip projecting over SVC. Upper normal heart size. Mediastinal contours and pulmonary vascularity normal. Atherosclerotic calcification aorta. Minimal bibasilar atelectasis. Lungs otherwise clear. No infiltrate, pleural effusion or pneumothorax. Bones demineralized. IMPRESSION: Minimal bibasilar atelectasis. Electronically Signed   By: MaLavonia Dana.D.   On: 01/10/2020 13:54   DG Chest Portable 1 View  Result Date: 12/24/2019 CLINICAL DATA:  Shortness of breath and urticaria EXAM: PORTABLE CHEST 1 VIEW COMPARISON:  December 08, 2019 FINDINGS: Port-A-Cath tip is in the superior vena cava. No pneumothorax. The lungs are clear. Heart is upper normal in size with pulmonary vascularity normal. No adenopathy. There is aortic atherosclerosis. No bone lesions. IMPRESSION: Stable Port-A-Cath position. No pneumothorax. No edema or airspace opacity. Stable cardiac silhouette. No adenopathy. Aortic  Atherosclerosis (ICD10-I70.0). Electronically Signed   By: WiLowella GripII M.D.   On: 12/24/2019 11:45   DG Chest Port 1 View  Result Date: 12/08/2019 CLINICAL DATA:  Left lower quadrant abdominal pain. Surgical procedure yesterday. Recent port placement. EXAM: PORTABLE CHEST 1 VIEW COMPARISON:  CT same day. Chest radiography yesterday. FINDINGS: Power port inserted from a right internal jugular approach. Tip at the SVC RA junction. No pneumothorax. Mild bibasilar atelectasis. Small amount of pneumoperitoneum as shown on the previous CT consistent with yesterday's procedure. IMPRESSION: Mild bibasilar atelectasis. Power port tip at the SVC RA junction. No pneumothorax. Small amount of pneumoperitoneum as shown on the previous CT. Electronically Signed   By: MaNelson Chimes.D.   On: 12/08/2019 16:48   DG CHEST PORT 1 VIEW  Result Date: 12/07/2019 CLINICAL DATA:  Status post Port-A-Cath placement. EXAM: PORTABLE CHEST 1 VIEW COMPARISON:  October 05, 2019. FINDINGS: Stable cardiomediastinal silhouette. No pneumothorax pleural effusion is noted. Mild bibasilar subsegmental atelectasis or scarring is noted. Interval placement of right internal jugular Port-A-Cath with distal tip in expected position of SVC. Atherosclerosis of thoracic aorta is noted. Bony thorax is unremarkable. IMPRESSION: Interval placement of right internal jugular Port-A-Cath with distal tip in expected position of the SVC. No pneumothorax is noted. Electronically Signed   By: JaMarijo Conception.D.   On: 12/07/2019 10:28   DG C-Arm 1-60 Min-No Report  Result Date: 12/07/2019 Fluoroscopy was utilized by the requesting physician.  No radiographic interpretation.   ECHOCARDIOGRAM COMPLETE  Result Date: 12/09/2019    ECHOCARDIOGRAM REPORT   [facility]:   [facility] 12[facility]2019 Medical Rec #:  [id number]   Height:       64.0 in Accession #:    [id number]  Weight:       158.8 lb Date of Birth:  [birth year]   BSA:           1.774 m  Patient Age:    66 years      BP:           142/78 mmHg Patient Gender: F             HR:           62 bpm. Exam Location:  [facility] Procedure: 2D Echo, Cardiac Doppler and Color Doppler Indications:    Atrial Fibrillation 427.31  History:        Patient has no prior history of Echocardiogram examinations.                 COPD.  Sonographer:    Sherrie Sport RDCS (AE) Referring Phys: [id number] [facility] Diagnosing      Serafina Royals MD Phys: IMPRESSIONS  1. Left ventricular ejection fraction, by estimation, is 50 to 55%. The left ventricle has low normal function. The left ventricle has no regional wall motion abnormalities. Left ventricular diastolic parameters were normal.  2. Right ventricular systolic function is normal. The right ventricular size is normal.  3. The mitral valve is normal in structure. Trivial mitral valve regurgitation.  4. The aortic valve is normal in structure. Aortic valve regurgitation is not visualized. FINDINGS  Left Ventricle: Left ventricular ejection fraction, by estimation, is 50 to 55%. The left ventricle has low normal function. The left ventricle has no regional wall motion abnormalities. The left ventricular internal cavity size was normal in size. There is no left ventricular hypertrophy. Left ventricular diastolic parameters were normal. Right Ventricle: The right ventricular size is normal. No increase in right ventricular wall thickness. Right ventricular systolic function is normal. Left Atrium: Left atrial size was normal in size. Right Atrium: Right atrial size was normal in size. Pericardium: There is no evidence of pericardial effusion. Mitral Valve: The mitral valve is normal in structure. Trivial mitral valve regurgitation. Tricuspid Valve: The tricuspid valve is normal in structure. Tricuspid valve regurgitation is trivial. Aortic Valve: The aortic valve is normal in structure. Aortic valve regurgitation is not visualized. Aortic valve mean gradient measures 3.5  mmHg. Aortic valve peak gradient measures 6.0 mmHg. Aortic valve area, by VTI measures 2.29 cm. Pulmonic Valve: The pulmonic valve was normal in structure. Pulmonic valve regurgitation is not visualized. Aorta: The aortic root and ascending aorta are structurally normal, with no evidence of dilitation. IAS/Shunts: No atrial level shunt detected by color flow Doppler.  LEFT VENTRICLE PLAX 2D LVIDd:         4.08 cm  Diastology LVIDs:         2.85 cm  LV e' lateral:   9.36 cm/s LV PW:         1.18 cm  LV E/e' lateral: 11.5 LV IVS:        0.92 cm  LV e' medial:    8.81 cm/s LVOT diam:     2.00 cm  LV E/e' medial:  12.3 LV SV:         56 LV SV Index:   32 LVOT Area:     3.14 cm  RIGHT VENTRICLE RV Basal diam:  4.02 cm LEFT ATRIUM             Index       RIGHT ATRIUM           Index LA diam:        4.40 cm 2.48 cm/m  RA Area:     21.80 cm LA Vol (A2C):   81.2 ml 45.78 ml/m RA  Volume:   68.60 ml  38.68 ml/m LA Vol (A4C):   71.0 ml 40.03 ml/m LA Biplane Vol: 75.0 ml 42.29 ml/m  AORTIC VALVE                   PULMONIC VALVE AV Area (Vmax):    2.21 cm    PV Vmax:        0.78 m/s AV Area (Vmean):   2.10 cm    PV Peak grad:   2.4 mmHg AV Area (VTI):     2.29 cm    RVOT Peak grad: 3 mmHg AV Vmax:           122.00 cm/s AV Vmean:          83.250 cm/s AV VTI:            0.244 m AV Peak Grad:      6.0 mmHg AV Mean Grad:      3.5 mmHg LVOT Vmax:         85.70 cm/s LVOT Vmean:        55.700 cm/s LVOT VTI:          0.178 m LVOT/AV VTI ratio: 0.73  AORTA Ao Root diam: 2.60 cm MITRAL VALVE MV Area (PHT): 3.37 cm     SHUNTS MV Decel Time: 225 msec     Systemic VTI:  0.18 m MV E velocity: 108.00 cm/s  Systemic Diam: 2.00 cm MV A velocity: 62.10 cm/s MV E/A ratio:  1.74 Serafina Royals MD Electronically signed by Serafina Royals MD Signature Date/Time: 12/09/2019/1:47:20 PM    Final    [place] CORE BIOPSY (SOFT TISSUE)  Result Date: 10/28/2019 CLINICAL DATA:  History of colon carcinoma and status post transverse colectomy and prior  treatment with chemotherapy and radiation therapy. Recent imaging has demonstrated new omental, peritoneal and abdominal wall soft tissue nodules which are hypermetabolic on PET scan and suspicious for recurrent disease. EXAM: ULTRASOUND GUIDED CORE BIOPSY OF ABDOMINAL WALL MASS MEDICATIONS: 1.0 mg IV Versed; 50 mcg IV Fentanyl Total Moderate Sedation Time: 12 minutes. The patient's level of consciousness and physiologic status were continuously monitored during the procedure by Radiology nursing. PROCEDURE: The procedure, risks, benefits, and alternatives were explained to the patient. Questions regarding the procedure were encouraged and answered. The patient understands and consents to the procedure. A time out was performed prior to initiating the procedure. Ultrasound was performed of the abdominal wall and anterior peritoneal cavity. The anterior abdominal wall was prepped with chlorhexidine in a sterile fashion, and a sterile drape was applied covering the operative field. A sterile gown and sterile gloves were used for the procedure. Local anesthesia was provided with 1% Lidocaine. Core biopsy of an anterior abdominal wall mass was performed with an 18 gauge core biopsy device. Four separate core biopsy samples were obtained and submitted in formalin. COMPLICATIONS: None. FINDINGS: Irregular lobulated hypoechoic soft tissue mass is seen involving the right lower abdominal wall and corresponding to one of the lesions that demonstrated increased metabolic activity by PET scan. This area measures approximately 3.2 x 2.1 x 2.6 cm by ultrasound. Solid tissue was obtained with biopsy. IMPRESSION: Ultrasound-guided core biopsy performed of a right lower abdominal wall mass measuring 3 cm in estimated maximal diameter. Electronically Signed   By: Aletta Edouard M.D.   On: 10/28/2019 12:02      ASSESSMENT/PLAN 74yo female who has recurrent Colon Cancer presents for follow up  Cancer Staging Malignant  neoplasm of colon [facility]) Staging  form: Colon and Rectum, AJCC 8th Edition - Clinical stage from 04/15/2017: Stage IIIB (cT4a, cN1b, cM0) - Signed by Earlie Server, MD on 04/26/2017  1. Malignant neoplasm of transverse colon ([place])   2. Encounter for antineoplastic chemotherapy   3. Chest pain, unspecified type    #Recurrent colon cancer with abdominal wall and mesentery nodule.    BRAF V600 E mutation and ATM mutation. Labs are reviewed and discussed with patient. s/p cycle 1 panitumumab.  She has been started on dose reduced encorafenib 150 mg daily for 3 days. Patient reports subjective feeling of hand and feet swelling as well as facial swelling this morning. Currently, with my physical examination, there is no significant facial swelling nor hand and feet swollen. I discussed with patient that encorafenibmay cause skin edema, although currently it does not appear to me that she has severe symptoms.  I recommend patient to apply compression stocking.  If symptoms are not improved after using nonpharmacological measures, I recommend patient to take Lasix 20 mg daily as needed severe edema.  I recommend patient to continue encorafenib  Mild chest pain, I recommend patient to go to emergency room for further evaluation of chest pain.  She declines.  She feels this is musculoskeletal. She has appointment with me tomorrow for Panitumumab..  We will further evaluate and follow-up on her.  # History of breast cancer, need mammogram in March 2021 # Parotid nodule with hypermetabolism.  Her treatment for colon cancer takes priority.  Once colon cancer is stabilized, will refer her to ENT.   # Leukocytosis, intermittent monocytosis, she declined bone marrow biopsy on 05/22/2018.  Continue to monitor. Treatments of colon cancer takes priority at this point.  She will keep her previously scheduled appointment with me tomorrow morning for evaluation prior to Panitumumab.  We spent sufficient time to discuss  many aspect of care, questions were answered to patient's satisfaction.  Earlie Server, MD, PhD Hematology Oncology [facility] Pager- [phone] 01/22/20

## 2020-01-22 NOTE — ED Provider Notes (Signed)
[facility] Emergency Department Provider Note ____________________________________________   First MD Initiated Contact with Patient 01/22/20 [phone]     (approximate)  I have reviewed the triage vital signs and the nursing notes.   HISTORY  Chief Complaint Back Pain and Arm Pain    HPI Kimberly [name] is a 74 y.o. female with PMH as noted below including colon cancer currently on oral chemotherapy who presents with right arm pain.  The pain started yesterday and has been persistent since then.  It originated in the wrist and has now been radiating up the arm and towards upper back.  It is described as burning and constant.  It is not worse with any position or movement.  She has taken hydrocodone without much relief.  The patient was recently restarted on Encorafenib but stopped taking it 2 days ago because she developed a reaction with extremity swelling and felt unwell.  Those symptoms have improved.  Past Medical History:  Diagnosis Date  . Breast cancer, left ([place]) 2004   Lumpectomy and rad tx's.  . Chronic back pain   . Colon cancer ([place]) 2018  . COPD (chronic obstructive pulmonary disease) ([place])   . Degenerative disc disease, lumbar 04/2013  . Family history of adverse reaction to anesthesia    son arrested after anesthesia  . GERD (gastroesophageal reflux disease)   . Hypertension   . Iron deficiency anemia 05/27/2017  . Personal history of chemotherapy 2018   Colon  . Personal history of radiation therapy    Breast 2004  . Personal history of radiation therapy 2018   Colon  . Postprocedural intraabdominal abscess 09/01/2018  . Small bowel obstruction ([place]) 04/16/2017  . Wears dentures    full upper    Patient Active Problem List   Diagnosis Date Noted  . A-fib ([place]) 01/10/2020  . Metastatic malignant neoplasm ([place])   . Encounter for antineoplastic chemotherapy 12/24/2019  . Infusion reaction 12/24/2019  . Anaphylaxis 12/24/2019  .  Shortness of breath   . Atrial fibrillation with rapid ventricular response ([place]) 12/08/2019  . Leukocytosis 12/08/2019  . Thrombocytosis ([place]) 12/08/2019  . Iron deficiency anemia due to chronic blood loss 12/08/2019  . Nocturnal hypoxemia 12/08/2019  . Goals of care, counseling/discussion 11/30/2019  . Personal history of colon cancer   . Intestinal bypass or anastomosis status   . Colonic edema   . Chronic obstructive pulmonary disease ([place])   . Dependence on nocturnal oxygen therapy 08/12/2018  . Clostridium difficile colitis 07/31/2018  . S/P colostomy takedown 06/25/2018  . Parastomal hernia without obstruction or gangrene   . Status post colon resection   . Colostomy status ([place])   . Colostomy prolapse ([place])   . Iron deficiency anemia 05/27/2017  . Port-A-Cath in place   . Left peroneal vein thrombosis ([place])   . Malignant neoplasm of colon ([place]) 04/26/2017  . Generalized abdominal pain   . Loss of weight   . Gastritis without bleeding   . Absence of bladder continence 04/01/2017  . Asthmatic breathing 04/01/2017  . OP (osteoporosis) 02/11/2017  . Skin lesion 02/11/2017  . Breast swelling 02/11/2017  . Narrowing of intervertebral disc space 02/11/2017  . Family history of colon cancer 02/11/2017  . Foot pain 02/11/2017  . Arthralgia of hip 02/11/2017  . Personal history of malignant neoplasm of breast 02/11/2017  . Eczema intertrigo 02/11/2017  . Neuropathy 02/11/2017  . Osteoarthrosis, unspecified whether generalized or localized, involving lower leg 02/11/2017  . Coronary  atherosclerosis 01/29/2017  . Smoking greater than 30 pack years 01/10/2017  . Vitamin D deficiency 08/21/2016  . Hyperlipidemia 11/10/2015  . Chronic back pain 05/26/2015  . DDD (degenerative disc disease), lumbosacral 05/26/2015  . History of colon polyps 04/27/2007    Past Surgical History:  Procedure Laterality Date  . APPENDECTOMY  1965  . BREAST EXCISIONAL BIOPSY Left 08/01/2003    lumpectomy rad 11/04-2/28/2005  . BREAST SURGERY    . CESAREAN SECTION     x3  . COLON SURGERY    . COLONOSCOPY W/ POLYPECTOMY    . COLONOSCOPY WITH PROPOFOL N/A 04/09/2018   Procedure: COLONOSCOPY WITH PROPOFOL;  Surgeon: Virgel Manifold, MD;  Location: [facility];  Service: Gastroenterology;  Laterality: N/A;  . COLONOSCOPY WITH PROPOFOL N/A 08/13/2019   Procedure: COLONOSCOPY WITH PROPOFOL;  Surgeon: Virgel Manifold, MD;  Location: [place];  Service: Endoscopy;  Laterality: N/A;  . COLOSTOMY TAKEDOWN N/A 06/25/2018   Procedure: COLOSTOMY TAKEDOWN;  Surgeon: Jules Husbands, MD;  Location: [facility];  Service: General;  Laterality: N/A;  . ESOPHAGOGASTRODUODENOSCOPY (EGD) WITH PROPOFOL N/A 04/04/2017   Procedure: ESOPHAGOGASTRODUODENOSCOPY (EGD) WITH PROPOFOL;  Surgeon: [name], MD;  Location: [place];  Service: Endoscopy;  Laterality: N/A;  . FRACTURE SURGERY    . HIP FRACTURE SURGERY Left 01/25/2012  . LAPAROSCOPY N/A 12/07/2019   Procedure: LAPAROSCOPY DIAGNOSTIC;  Surgeon: Jules Husbands, MD;  Location: [facility];  Service: General;  Laterality: N/A;  . LAPAROTOMY N/A 04/15/2017   Procedure: EXPLORATORY LAPAROTOMY;  Surgeon: Clayburn Pert, MD;  Location: [facility];  Service: General;  Laterality: N/A;  . NECK SURGERY  12/2011  . PARASTOMAL HERNIA REPAIR N/A 12/03/2017   Procedure: HERNIA REPAIR PARASTOMAL;  Surgeon: Jules Husbands, MD;  Location: [facility];  Service: General;  Laterality: N/A;  . PARASTOMAL HERNIA REPAIR N/A 06/25/2018   Procedure: HERNIA REPAIR PARASTOMAL;  Surgeon: Jules Husbands, MD;  Location: [facility];  Service: General;  Laterality: N/A;  . PORTACATH PLACEMENT Right 05/21/2017   Procedure: INSERTION PORT-A-CATH;  Surgeon: Clayburn Pert, MD;  Location: [facility];  Service: General;  Laterality: Right;  . PORTACATH PLACEMENT Right 12/07/2019   Procedure: INSERTION PORT-A-CATH;  Surgeon: Jules Husbands, MD;  Location: [facility];   Service: General;  Laterality: Right;  . WRIST FRACTURE SURGERY      Prior to Admission medications   Medication Sig Start Date End Date Taking? Authorizing Provider  apixaban (ELIQUIS) 5 MG TABS tablet Take 1 tablet (5 mg total) by mouth 2 (two) times daily. 01/18/20   Birdie Sons, MD  budesonide-formoterol (SYMBICORT) 80-4.5 MCG/ACT inhaler Inhale 2 puffs into the lungs 2 (two) times daily. 12/25/19   Lavina Hamman, MD  calcium citrate-vitamin D (CITRACAL+D) 315-200 MG-UNIT tablet Take 1 tablet by mouth daily.    [provider]  Cholecalciferol (VITAMIN D3) 50 MCG (2000 UT) TABS Take 2,000 Units by mouth daily. 11/05/19   Birdie Sons, MD  diphenhydrAMINE (BENADRYL) 25 MG tablet Take 2 tablets (50 mg total) by mouth at bedtime as needed for itching or allergies. 12/25/19   Lavina Hamman, MD  encorafenib (BRAFTOVI) 75 MG capsule Take 150 mg by mouth daily.    [provider]  EPINEPHrine 0.3 mg/0.3 mL IJ SOAJ injection Inject 0.3 mLs (0.3 mg total) into the muscle as needed for anaphylaxis. 12/25/19   Lavina Hamman, MD  furosemide (LASIX) 20 MG tablet Take 1 tablet (20 mg total)  by mouth daily as needed. 01/20/20   Earlie Server, MD  gabapentin (NEURONTIN) 400 MG capsule TAKE 1 CAPSULE THREE TIMES DAILY Patient taking differently: Take 400 mg by mouth 2 (two) times daily.  11/08/19   Earlie Server, MD  HYDROcodone-acetaminophen (NORCO) 7.5-325 MG tablet Take 1-2 tablets by mouth every 6 (six) hours as needed for moderate pain. 01/18/20   Birdie Sons, MD  meloxicam (MOBIC) 15 MG tablet TAKE 1 TABLET EVERY DAY AS NEEDED Patient taking differently: Take 15 mg by mouth at bedtime.  05/20/19   Birdie Sons, MD  metoprolol succinate (TOPROL-XL) 100 MG 24 hr tablet Take 1 tablet (100 mg total) by mouth daily. Take with or immediately following a meal. 01/18/20   Birdie Sons, MD  metoprolol tartrate (LOPRESSOR) 50 MG tablet Take 1 tablet (50 mg total) by mouth 2 (two) times  daily. Patient not taking: Reported on 01/21/2020 01/11/20   Fritzi Mandes, MD  Multiple Vitamins-Minerals (WOMENS MULTI VITAMIN & MINERAL PO) Take 2 tablets by mouth daily.     [provider]  omeprazole (PRILOSEC) 20 MG capsule Take 1 capsule (20 mg total) by mouth daily. 03/10/19   Birdie Sons, MD  oxyCODONE-acetaminophen (PERCOCET) 5-325 MG tablet Take 1 tablet by mouth every 4 (four) hours as needed for severe pain. 01/22/20 01/21/21  Arta Silence, MD  pravastatin (PRAVACHOL) 40 MG tablet TAKE 1 TABLET EVERY DAY Patient taking differently: Take 40 mg by mouth daily.  05/20/19   Birdie Sons, MD  Probiotic Product (PROBIOTIC DAILY PO) Take 1 capsule by mouth daily.     [provider]  senna-docusate (SENOKOT-S) 8.6-50 MG tablet Take 2 tablets by mouth daily. 12/24/19   Earlie Server, MD    Allergies Erbitux [cetuximab], Betadine [povidone iodine], Iodine, Other, and Sinus formula [cholestatin]  Family History  Problem Relation Age of Onset  . Diabetes Mother        type 2  . Coronary artery disease Mother   . Deep vein thrombosis Mother   . Colon cancer Father   . Asthma Brother   . Diabetes Brother        type 2  . Breast cancer Neg Hx     Social History Social History   Tobacco Use  . Smoking status: Former Smoker    Packs/day: 0.25    Years: 55.00    Pack years: 13.75    Types: Cigarettes    Quit date: 05/21/2017    Years since quitting: 2.6  . Smokeless tobacco: Never Used  . Tobacco comment: started age 38 1/2 to 1 ppd  Substance Use Topics  . Alcohol use: Yes    Alcohol/week: 0.0 standard drinks    Comment: occasionally drinks beer / 1 month  . Drug use: No    Review of Systems  Constitutional: No fever. Eyes: No redness. ENT: No sore throat. Cardiovascular: Denies chest pain. Respiratory: Denies shortness of breath. Gastrointestinal: No vomiting.  Genitourinary: Negative for flank pain.  Musculoskeletal: Positive for back  pain. Skin: Negative for rash. Neurological: Negative for focal weakness or numbness.   ____________________________________________   PHYSICAL EXAM:  VITAL SIGNS: ED Triage Vitals  Enc Vitals Group     BP 01/22/20 0858 (!) 158/76     Pulse Rate 01/22/20 0858 66     Resp 01/22/20 0858 16     Temp 01/22/20 0858 98.3 F (36.8 C)     Temp Source 01/22/20 0858 Oral  SpO2 01/22/20 0858 92 %     Weight 01/22/20 0900 158 lb (71.7 kg)     Height 01/22/20 0900 '5\' 4"'  (1.626 m)     Head Circumference --      Peak Flow --      Pain Score 01/22/20 0859 9     Pain Loc --      Pain Edu? --      Excl. in [place]? --     Constitutional: Alert and oriented. Well appearing and in no acute distress. Eyes: Conjunctivae are normal.  Head: Atraumatic. Nose: No congestion/rhinnorhea. Mouth/Throat: Mucous membranes are moist.   Neck: Normal range of motion.  Cardiovascular: Normal rate, regular rhythm. Good peripheral circulation. Respiratory: Normal respiratory effort.  No retractions.  Gastrointestinal: No distention.  Musculoskeletal: Extremities warm and well perfused.  Full range of motion to all joints of right arm.  No tenderness to palpation.  No midline spinal tenderness.  2+ radial pulse.  Cap refill less than 2 seconds. Neurologic:  Normal speech and language.  5/5 motor strength and intact sensation to bilateral upper extremities. Skin:  Skin is warm and dry. No rash noted. Psychiatric: Mood and affect are normal. Speech and behavior are normal.  ____________________________________________   LABS (all labs ordered are listed, but only abnormal results are displayed)  Labs Reviewed  BASIC METABOLIC PANEL - Abnormal; Notable for the following components:      Result Value   Calcium 8.0 (*)    All other components within normal limits  CBC WITH DIFFERENTIAL/PLATELET - Abnormal; Notable for the following components:   Hemoglobin 11.9 (*)    RDW 20.7 (*)    Platelets 405 (*)     Monocytes Absolute 1.3 (*)    All other components within normal limits  SEDIMENTATION RATE   ____________________________________________  EKG  ED ECG REPORT I, Arta Silence, the attending physician, personally viewed and interpreted this ECG.  Date: 01/22/2020 EKG Time: 0858 Rate: 62 Rhythm: normal sinus rhythm QRS Axis: normal Intervals: normal ST/T Wave abnormalities: Nonspecific T wave abnormalities Narrative Interpretation: no evidence of acute ischemia  ____________________________________________  RADIOLOGY    ____________________________________________   PROCEDURES  Procedure(s) performed: No  Procedures  Critical Care performed: No ____________________________________________   INITIAL IMPRESSION / ASSESSMENT AND PLAN / ED COURSE  Pertinent labs & imaging results that were available during my care of the patient were reviewed by me and considered in my medical decision making (see chart for details).  74 year old female with PMH as noted above including current treatment for colon cancer presents with right arm pain radiating from the wrist proximally to the upper arm and upper back.  I reviewed the past medical records in [place].  The patient was admitted earlier this month with atrial fibrillation with RVR.  She restarted Encorafenib a week ago (after concern for possible prior adverse reaction) but discontinued it two days ago due to extremity swelling.   On exam the patient is well-appearing.  Her vital signs are normal except for mild hypertension.  The right upper extremity appears normal externally, she has full range of motion all joints, no tenderness or reproducibility of the pain on palpation, and a normal neuro/vascular exam.  Overall I suspect most likely peripheral neuropathy vs radiculopathy.  This could be a side effect of the chemo.  Differential also includes electrolyte abnormality.  There is no evidence of DVT or other vascular  etiology, or of muscular pain.  We will obtain an EKG, basic labs  and ESR, give analgesia and reassess.   ----------------------------------------- 11:27 AM on 01/22/2020 -----------------------------------------  Lab work-up is unremarkable.  The patient reports improved pain after the Toradol.  She is stable for discharge home.  I counseled her on the results of the work-up.  I will prescribe oxycodone for additional pain although I instructed her specifically not to take it at the same time as she is taking hydrocodone but to substitute it for the next few days if needed.  Return precautions given, and the patient expressed understanding.  ____________________________________________   FINAL CLINICAL IMPRESSION(S) / ED DIAGNOSES  Final diagnoses:  Right arm pain      NEW MEDICATIONS STARTED DURING THIS VISIT:  New Prescriptions   OXYCODONE-ACETAMINOPHEN (PERCOCET) 5-325 MG TABLET    Take 1 tablet by mouth every 4 (four) hours as needed for severe pain.     Note:  This document was prepared using Dragon voice recognition software and may include unintentional dictation errors.    Arta Silence, MD 01/22/20 1128

## 2020-01-24 ENCOUNTER — Telehealth: Payer: Self-pay

## 2020-01-24 NOTE — Telephone Encounter (Signed)
Patient informed to start Slow-Mag.  She would like for Dr. Tasia Catchings to know that she went to ER Saturday with right hand/arm pain that radiated to mid back.  They told her it was neuropathy and to take the gabapentin as prescribed by Dr. Tasia Catchings in 11/2019.  She was only taking Gabapentin BID and has started taking TID, as prescribed, which seems to be helping with the pain.

## 2020-01-24 NOTE — Telephone Encounter (Signed)
Noted! Thank you

## 2020-01-24 NOTE — Telephone Encounter (Signed)
-----   Message from Earlie Server, MD sent at 01/22/2020  3:18 PM EDT ----- Please advise patient take Slow-Mag once daily for a week.  Magnesium level is slightly low.  Thank you

## 2020-01-25 ENCOUNTER — Other Ambulatory Visit: Payer: Self-pay | Admitting: Oncology

## 2020-01-28 ENCOUNTER — Inpatient Hospital Stay (HOSPITAL_BASED_OUTPATIENT_CLINIC_OR_DEPARTMENT_OTHER): Payer: Medicare HMO | Admitting: Hospice and Palliative Medicine

## 2020-01-28 ENCOUNTER — Other Ambulatory Visit: Payer: Self-pay

## 2020-01-28 ENCOUNTER — Encounter: Payer: Self-pay | Admitting: Hospice and Palliative Medicine

## 2020-01-28 VITALS — BP 145/83 | HR 72 | Temp 97.2°F | Resp 20 | Wt 160.4 lb

## 2020-01-28 DIAGNOSIS — M7989 Other specified soft tissue disorders: Secondary | ICD-10-CM | POA: Diagnosis not present

## 2020-01-28 DIAGNOSIS — C184 Malignant neoplasm of transverse colon: Secondary | ICD-10-CM | POA: Diagnosis not present

## 2020-01-28 DIAGNOSIS — G893 Neoplasm related pain (acute) (chronic): Secondary | ICD-10-CM | POA: Diagnosis not present

## 2020-01-28 DIAGNOSIS — Z7189 Other specified counseling: Secondary | ICD-10-CM

## 2020-01-28 DIAGNOSIS — Z515 Encounter for palliative care: Secondary | ICD-10-CM | POA: Diagnosis not present

## 2020-01-28 DIAGNOSIS — K56609 Unspecified intestinal obstruction, unspecified as to partial versus complete obstruction: Secondary | ICD-10-CM | POA: Diagnosis not present

## 2020-01-28 DIAGNOSIS — R634 Abnormal weight loss: Secondary | ICD-10-CM | POA: Diagnosis not present

## 2020-01-28 DIAGNOSIS — D509 Iron deficiency anemia, unspecified: Secondary | ICD-10-CM | POA: Diagnosis not present

## 2020-01-28 DIAGNOSIS — Z5112 Encounter for antineoplastic immunotherapy: Secondary | ICD-10-CM | POA: Diagnosis not present

## 2020-01-28 DIAGNOSIS — I4891 Unspecified atrial fibrillation: Secondary | ICD-10-CM | POA: Diagnosis not present

## 2020-01-28 NOTE — Progress Notes (Signed)
[place]  [phone] [phone]   Name: Kimberly [name] Date: 01/28/2020 MRN: [id number]  DOB: 01-Jun-1946  Patient Care Team: Birdie Sons, MD as PCP - General (Family Medicine) Earlie Server, MD as Consulting Physician (Oncology) Noreene Filbert, MD as Referring Physician (Radiation Oncology) Jules Husbands, MD as Consulting Physician (General Surgery) Corey Skains, MD as Consulting Physician (Cardiology)    REASON FOR CONSULTATION: Kimberly [name] is a 74 y.o. female with multiple medical problems including recurrent stage IIIb colon cancer status post resection in 2018 with colostomy reversal in 05/2018 and status post adjuvant FOLFOX.  PET scan on 09/19/2019 revealed recurrent disease.  Patient underwent CT-guided biopsy 08/20/2019 one of her abdominal wall mass revealing metastatic adenocarcinoma of the colon.  Patient was hospitalized 12/08/2019-12/10/2019 with A. fib/RVR and was started on anticoagulation with Eliquis.  On 12/24/2019 patient had anaphylactic reaction to cetuximab requiring intubation.  Patient has decided to forego further treatment.  She is referred to palliative care to address goals dementia ongoing symptoms.  SOCIAL HISTORY:     reports that she quit smoking about 2 years ago. Her smoking use included cigarettes. She has a 13.75 pack-year smoking history. She has never used smokeless tobacco. She reports current alcohol use. She reports that she does not use drugs.   Patient is divorced.  She lives at home alone.  She has a daughter who lives nearby and a son in [place].  Patient previously worked in [employer].  ADVANCE DIRECTIVES:  Patient's daughter is her [facility] POA  CODE STATUS: DNR/DNI (MOST form completed on 01/28/2020)  PAST MEDICAL HISTORY: Past Medical History:  Diagnosis Date  . Breast cancer, left ([place]) 2004   Lumpectomy and rad tx's.  . Chronic back pain   . Colon cancer ([place]) 2018   . COPD (chronic obstructive pulmonary disease) ([place])   . Degenerative disc disease, lumbar 04/2013  . Family history of adverse reaction to anesthesia    son arrested after anesthesia  . GERD (gastroesophageal reflux disease)   . Hypertension   . Iron deficiency anemia 05/27/2017  . Personal history of chemotherapy 2018   Colon  . Personal history of radiation therapy    Breast 2004  . Personal history of radiation therapy 2018   Colon  . Postprocedural intraabdominal abscess 09/01/2018  . Small bowel obstruction ([place]) 04/16/2017  . Wears dentures    full upper    PAST SURGICAL HISTORY:  Past Surgical History:  Procedure Laterality Date  . APPENDECTOMY  1965  . BREAST EXCISIONAL BIOPSY Left 08/01/2003   lumpectomy rad 11/04-2/28/2005  . BREAST SURGERY    . CESAREAN SECTION     x3  . COLON SURGERY    . COLONOSCOPY W/ POLYPECTOMY    . COLONOSCOPY WITH PROPOFOL N/A 04/09/2018   Procedure: COLONOSCOPY WITH PROPOFOL;  Surgeon: Virgel Manifold, MD;  Location: [facility];  Service: Gastroenterology;  Laterality: N/A;  . COLONOSCOPY WITH PROPOFOL N/A 08/13/2019   Procedure: COLONOSCOPY WITH PROPOFOL;  Surgeon: Virgel Manifold, MD;  Location: [place];  Service: Endoscopy;  Laterality: N/A;  . COLOSTOMY TAKEDOWN N/A 06/25/2018   Procedure: COLOSTOMY TAKEDOWN;  Surgeon: Jules Husbands, MD;  Location: [facility];  Service: General;  Laterality: N/A;  . ESOPHAGOGASTRODUODENOSCOPY (EGD) WITH PROPOFOL N/A 04/04/2017   Procedure: ESOPHAGOGASTRODUODENOSCOPY (EGD) WITH PROPOFOL;  Surgeon: [name], MD;  Location: [place];  Service: Endoscopy;  Laterality: N/A;  . FRACTURE  SURGERY    . HIP FRACTURE SURGERY Left 01/25/2012  . LAPAROSCOPY N/A 12/07/2019   Procedure: LAPAROSCOPY DIAGNOSTIC;  Surgeon: Jules Husbands, MD;  Location: [facility];  Service: General;  Laterality: N/A;  . LAPAROTOMY N/A 04/15/2017   Procedure: EXPLORATORY LAPAROTOMY;  Surgeon: Clayburn Pert, MD;  Location: [facility];  Service: General;  Laterality: N/A;  . NECK SURGERY  12/2011  . PARASTOMAL HERNIA REPAIR N/A 12/03/2017   Procedure: HERNIA REPAIR PARASTOMAL;  Surgeon: Jules Husbands, MD;  Location: [facility];  Service: General;  Laterality: N/A;  . PARASTOMAL HERNIA REPAIR N/A 06/25/2018   Procedure: HERNIA REPAIR PARASTOMAL;  Surgeon: Jules Husbands, MD;  Location: [facility];  Service: General;  Laterality: N/A;  . PORTACATH PLACEMENT Right 05/21/2017   Procedure: INSERTION PORT-A-CATH;  Surgeon: Clayburn Pert, MD;  Location: [facility];  Service: General;  Laterality: Right;  . PORTACATH PLACEMENT Right 12/07/2019   Procedure: INSERTION PORT-A-CATH;  Surgeon: Jules Husbands, MD;  Location: [facility];  Service: General;  Laterality: Right;  . WRIST FRACTURE SURGERY      HEMATOLOGY/ONCOLOGY HISTORY:  Oncology History  Malignant neoplasm of colon ([place])  04/26/2017 Initial Diagnosis   Malignant neoplasm of transverse colon ([place])   11/06/2019 - 11/06/2019 Chemotherapy   The patient had palonosetron (ALOXI) injection 0.25 mg, 0.25 mg, Intravenous,  Once, 0 of 4 cycles irinotecan (CAMPTOSAR) 320 mg in sodium chloride 0.9 % 500 mL chemo infusion, 180 mg/m2, Intravenous,  Once, 0 of 4 cycles fluorouracil (ADRUCIL) chemo injection 750 mg, 400 mg/m2, Intravenous,  Once, 0 of 4 cycles fluorouracil (ADRUCIL) 4,350 mg in sodium chloride 0.9 % 63 mL chemo infusion, 2,400 mg/m2, Intravenous, 1 Day/Dose, 0 of 4 cycles bevacizumab-bvzr (ZIRABEV) 400 mg in sodium chloride 0.9 % 100 mL chemo infusion, 5 mg/kg, Intravenous,  Once, 0 of 4 cycles leucovorin 728 mg in sodium chloride 0.9 % 250 mL infusion, 400 mg/m2, Intravenous,  Once, 0 of 4 cycles  for chemotherapy treatment.    12/24/2019 - 12/24/2019 Chemotherapy   The patient had cetuximab (ERBITUX) chemo infusion 700 mg, 400 mg/m2 = 700 mg, Intravenous,  Once, 1 of 4 cycles Administration: 700 mg (12/24/2019)  for chemotherapy treatment.      01/07/2020 -  Chemotherapy   The patient had panitumumab (VECTIBIX) 400 mg in sodium chloride 0.9 % 100 mL chemo infusion, 6 mg/kg = 400 mg, Intravenous,  Once, 1 of 1 cycle Administration: 400 mg (01/07/2020) panitumumab (VECTIBIX) 400 mg in sodium chloride 0.9 % 100 mL chemo infusion, 6 mg/kg, Intravenous,  Once, 0 of 3 cycles  for chemotherapy treatment.      ALLERGIES:  is allergic to erbitux [cetuximab]; betadine [povidone iodine]; iodine; other; and sinus formula [cholestatin].  MEDICATIONS:  Current Outpatient Medications  Medication Sig Dispense Refill  . apixaban (ELIQUIS) 5 MG TABS tablet Take 1 tablet (5 mg total) by mouth 2 (two) times daily. 60 tablet 3  . budesonide-formoterol (SYMBICORT) 80-4.5 MCG/ACT inhaler Inhale 2 puffs into the lungs 2 (two) times daily. 3 Inhaler 3  . calcium citrate-vitamin D (CITRACAL+D) 315-200 MG-UNIT tablet Take 1 tablet by mouth daily.    . Cholecalciferol (VITAMIN D3) 50 MCG (2000 UT) TABS Take 2,000 Units by mouth daily.    . diphenhydrAMINE (BENADRYL) 25 MG tablet Take 2 tablets (50 mg total) by mouth at bedtime as needed for itching or allergies. 30 tablet 0  . encorafenib (BRAFTOVI) 75 MG capsule Take 150 mg by mouth daily.    Marland Kitchen  EPINEPHrine 0.3 mg/0.3 mL IJ SOAJ injection Inject 0.3 mLs (0.3 mg total) into the muscle as needed for anaphylaxis. 1 each 0  . furosemide (LASIX) 20 MG tablet Take 1 tablet (20 mg total) by mouth daily as needed. 30 tablet 0  . gabapentin (NEURONTIN) 400 MG capsule TAKE 1 CAPSULE THREE TIMES DAILY (Patient taking differently: Take 400 mg by mouth 2 (two) times daily. ) 270 capsule 1  . HYDROcodone-acetaminophen (NORCO) 7.5-325 MG tablet Take 1-2 tablets by mouth every 6 (six) hours as needed for moderate pain. 120 tablet 0  . magnesium chloride (SLOW-MAG) 64 MG TBEC SR tablet Take 1 tablet (64 mg total) by mouth daily. 7 tablet 0  . meloxicam (MOBIC) 15 MG tablet TAKE 1 TABLET EVERY DAY AS NEEDED (Patient taking  differently: Take 15 mg by mouth at bedtime. ) 90 tablet 3  . metoprolol succinate (TOPROL-XL) 100 MG 24 hr tablet Take 1 tablet (100 mg total) by mouth daily. Take with or immediately following a meal. 30 tablet 3  . metoprolol tartrate (LOPRESSOR) 50 MG tablet Take 1 tablet (50 mg total) by mouth 2 (two) times daily. (Patient not taking: Reported on 01/21/2020) 60 tablet 0  . Multiple Vitamins-Minerals (WOMENS MULTI VITAMIN & MINERAL PO) Take 2 tablets by mouth daily.     Marland Kitchen omeprazole (PRILOSEC) 20 MG capsule Take 1 capsule (20 mg total) by mouth daily. 90 capsule 4  . oxyCODONE-acetaminophen (PERCOCET) 5-325 MG tablet Take 1 tablet by mouth every 4 (four) hours as needed for severe pain. 15 tablet 0  . pravastatin (PRAVACHOL) 40 MG tablet TAKE 1 TABLET EVERY DAY (Patient taking differently: Take 40 mg by mouth daily. ) 90 tablet 4  . Probiotic Product (PROBIOTIC DAILY PO) Take 1 capsule by mouth daily.     Marland Kitchen senna-docusate (SENOKOT-S) 8.6-50 MG tablet Take 2 tablets by mouth daily. 60 tablet 1   No current facility-administered medications for this visit.    VITAL SIGNS: There were no vitals taken for this visit. There were no vitals filed for this visit.  Estimated body mass index is 27.12 kg/m as calculated from the following:   Height as of 01/22/20: '5\' 4"'  (1.626 m).   Weight as of 01/22/20: 158 lb (71.7 kg).  LABS: CBC:    Component Value Date/Time   WBC 9.7 01/22/2020 0909   HGB 11.9 (L) 01/22/2020 0909   HGB 12.2 01/02/2017 1410   HCT 38.2 01/22/2020 0909   HCT 37.3 01/02/2017 1410   PLT 405 (H) 01/22/2020 0909   PLT 486 (H) 01/02/2017 1410   MCV 88.4 01/22/2020 0909   MCV 86 01/02/2017 1410   MCV 96 01/29/2012 0428   NEUTROABS 6.4 01/22/2020 0909   NEUTROABS 7.8 (H) 01/02/2017 1410   NEUTROABS 9.2 (H) 01/29/2012 0428   LYMPHSABS 1.7 01/22/2020 0909   LYMPHSABS 3.9 (H) 01/02/2017 1410   LYMPHSABS 1.9 01/29/2012 0428   MONOABS 1.3 (H) 01/22/2020 0909   MONOABS 2.0 (H)  01/29/2012 0428   EOSABS 0.2 01/22/2020 0909   EOSABS 0.4 01/02/2017 1410   EOSABS 0.1 01/29/2012 0428   BASOSABS 0.1 01/22/2020 0909   BASOSABS 0.1 01/02/2017 1410   BASOSABS 0.1 01/29/2012 0428   Comprehensive Metabolic Panel:    Component Value Date/Time   NA 135 01/22/2020 0909   NA 141 01/02/2017 1410   NA 136 01/27/2012 0637   K 3.5 01/22/2020 0909   K 4.3 01/27/2012 0637   CL 100 01/22/2020 0909   CL  102 01/27/2012 0637   CO2 27 01/22/2020 0909   CO2 25 01/27/2012 0637   BUN 13 01/22/2020 0909   BUN 14 01/02/2017 1410   BUN 15 01/27/2012 0637   CREATININE 0.69 01/22/2020 0909   CREATININE 0.67 01/27/2012 0637   GLUCOSE 96 01/22/2020 0909   GLUCOSE 119 (H) 01/27/2012 0637   CALCIUM 8.0 (L) 01/22/2020 0909   CALCIUM 7.7 (L) 01/27/2012 0637   AST 11 (L) 01/21/2020 0818   AST 17 01/26/2012 0021   ALT 10 01/21/2020 0818   ALT 19 01/26/2012 0021   ALKPHOS 73 01/21/2020 0818   ALKPHOS 87 01/26/2012 0021   BILITOT 0.3 01/21/2020 0818   BILITOT <0.2 01/02/2017 1410   BILITOT 0.2 01/26/2012 0021   PROT 5.9 (L) 01/21/2020 0818   PROT 7.0 01/02/2017 1410   PROT 7.6 01/26/2012 0021   ALBUMIN 2.7 (L) 01/21/2020 0818   ALBUMIN 3.9 01/02/2017 1410   ALBUMIN 3.5 01/26/2012 0021    RADIOGRAPHIC STUDIES: DG Chest Portable 1 View  Result Date: 01/10/2020 CLINICAL DATA:  Shortness of breath, chest pain, symptoms since Saturday, history colon cancer, melanoma EXAM: PORTABLE CHEST 1 VIEW COMPARISON:  Portable exam 1347 hours compared to 12/24/2019 FINDINGS: RIGHT jugular Port-A-Cath with tip projecting over SVC. Upper normal heart size. Mediastinal contours and pulmonary vascularity normal. Atherosclerotic calcification aorta. Minimal bibasilar atelectasis. Lungs otherwise clear. No infiltrate, pleural effusion or pneumothorax. Bones demineralized. IMPRESSION: Minimal bibasilar atelectasis. Electronically Signed   By: Lavonia Dana M.D.   On: 01/10/2020 13:54    PERFORMANCE STATUS  (ECOG) : 1 - Symptomatic but completely ambulatory  Review of Systems Unless otherwise noted, a complete review of systems is negative.  Physical Exam General: NAD Pulmonary: Unlabored Extremities: no edema, no joint deformities Skin: no rashes Neurological: Weakness but otherwise nonfocal  IMPRESSION: I met with patient and daughter today in the clinic.  Introduced palliative care services and attempted to establish therapeutic rapport.  Patient confirms that she has opted to forego any future cancer treatment.  She is more interested in focusing on quality of life and comfort at home.  Patient is still living at home independently but daughter would like to explore resources for providing her with more support at home.  We discussed option of hospice in detail.  Both patient and daughter would like to proceed with a referral to hospice for in-home care.  Patient denies any distressing symptoms at present.  She does have "good days and bad days" and sometimes feels fatigued, requiring daytime napping.  She denies pain at present.  We discussed advance care planning.  Daughter is reportedly her healthcare power of attorney.  Patient says that she would not want her life prolonged artificially on machines.  She would not be interested in CPR or other life resuscitative measures if they would likely prove futile such as in the case of advanced cancer.  Patient would be okay with short-term hospitalization if needed to treat the treatable.   I completed a MOST form today. The patient and family outlined their wishes for the following treatment decisions:  Cardiopulmonary Resuscitation: Do Not Attempt Resuscitation (DNR/No CPR)  Medical Interventions: Limited Additional Interventions: Use medical treatment, IV fluids and cardiac monitoring as indicated, DO NOT USE intubation or mechanical ventilation. May consider use of less invasive airway support such as BiPAP or CPAP. Also provide comfort  measures. Transfer to the hospital if indicated. Avoid intensive care.   Antibiotics: Antibiotics if indicated  IV Fluids: IV fluids  if indicated  Feeding Tube: No feeding tube     PLAN: -Best supportive care -Referral to hospice -DNR/DNI -MOST form completed -RTC 3 months or as needed  Case and plan discussed with Dr. Tasia Catchings   Patient expressed understanding and was in agreement with this plan. She also understands that She can call the clinic at any time with any questions, concerns, or complaints.     Time Total: 30 minutes  Visit consisted of counseling and education dealing with the complex and emotionally intense issues of symptom management and palliative care in the setting of serious and potentially life-threatening illness.Greater than 50%  of this time was spent counseling and coordinating care related to the above assessment and plan.  Signed by: Altha Harm, PhD, NP-C

## 2020-01-28 NOTE — Progress Notes (Signed)
Patient woke up with a headache this morning that was "pounding" and was relieved with Tylenol.  Had an episode of increased dyspnea on exertion yesterday while trying to walk to her mailbox.

## 2020-02-11 ENCOUNTER — Other Ambulatory Visit: Payer: Self-pay | Admitting: Oncology

## 2020-02-17 ENCOUNTER — Other Ambulatory Visit: Payer: Self-pay | Admitting: Hospice and Palliative Medicine

## 2020-02-17 DIAGNOSIS — M5136 Other intervertebral disc degeneration, lumbar region: Secondary | ICD-10-CM

## 2020-02-17 MED ORDER — HYDROCODONE-ACETAMINOPHEN 7.5-325 MG PO TABS
1.0000 | ORAL_TABLET | Freq: Four times a day (QID) | ORAL | 0 refills | Status: DC | PRN
Start: 2020-02-17 — End: 2020-03-20

## 2020-02-17 NOTE — Progress Notes (Deleted)
Established patient visit   Patient: Kimberly [name]   DOB: 1946/07/10   74 y.o. Female  MRN: [id number] Visit Date: 02/18/2020  Today's healthcare provider: Lelon Huh, MD   No chief complaint on file.  Subjective    HPI  Follow up for A-Fib:  The patient was last seen for this 1 months ago. Changes made at last visit include ***.  She reports {excellent/good/fair/poor:19665} compliance with treatment. She feels that condition is {improved/worse/unchanged:3041574}. She {is/is not:21021397} having side effects. ***  -----------------------------------------------------------------------------------------    {Show patient history (optional):23778::" "}   Medications: Outpatient Medications Prior to Visit  Medication Sig  . furosemide (LASIX) 20 MG tablet TAKE 1 TABLET BY MOUTH EVERY DAY AS NEEDED  . apixaban (ELIQUIS) 5 MG TABS tablet Take 1 tablet (5 mg total) by mouth 2 (two) times daily.  . budesonide-formoterol (SYMBICORT) 80-4.5 MCG/ACT inhaler Inhale 2 puffs into the lungs 2 (two) times daily.  . calcium citrate-vitamin D (CITRACAL+D) 315-200 MG-UNIT tablet Take 1 tablet by mouth daily.  . Cholecalciferol (VITAMIN D3) 50 MCG (2000 UT) TABS Take 2,000 Units by mouth daily.  . diphenhydrAMINE (BENADRYL) 25 MG tablet Take 2 tablets (50 mg total) by mouth at bedtime as needed for itching or allergies.  Marland Kitchen encorafenib (BRAFTOVI) 75 MG capsule Take 150 mg by mouth daily.  Marland Kitchen EPINEPHrine 0.3 mg/0.3 mL IJ SOAJ injection Inject 0.3 mLs (0.3 mg total) into the muscle as needed for anaphylaxis.  Marland Kitchen gabapentin (NEURONTIN) 400 MG capsule TAKE 1 CAPSULE THREE TIMES DAILY (Patient taking differently: Take 400 mg by mouth 2 (two) times daily. )  . HYDROcodone-acetaminophen (NORCO) 7.5-325 MG tablet Take 1-2 tablets by mouth every 6 (six) hours as needed for moderate pain.  . magnesium chloride (SLOW-MAG) 64 MG TBEC SR tablet Take 1 tablet (64 mg total) by mouth daily.  . meloxicam  (MOBIC) 15 MG tablet TAKE 1 TABLET EVERY DAY AS NEEDED (Patient taking differently: Take 15 mg by mouth at bedtime. )  . metoprolol succinate (TOPROL-XL) 100 MG 24 hr tablet Take 1 tablet (100 mg total) by mouth daily. Take with or immediately following a meal.  . metoprolol tartrate (LOPRESSOR) 50 MG tablet Take 1 tablet (50 mg total) by mouth 2 (two) times daily. (Patient not taking: Reported on 01/21/2020)  . Multiple Vitamins-Minerals (WOMENS MULTI VITAMIN & MINERAL PO) Take 2 tablets by mouth daily.   Marland Kitchen omeprazole (PRILOSEC) 20 MG capsule Take 1 capsule (20 mg total) by mouth daily.  Marland Kitchen oxyCODONE-acetaminophen (PERCOCET) 5-325 MG tablet Take 1 tablet by mouth every 4 (four) hours as needed for severe pain. (Patient not taking: Reported on 01/28/2020)  . pravastatin (PRAVACHOL) 40 MG tablet TAKE 1 TABLET EVERY DAY (Patient taking differently: Take 40 mg by mouth daily. )  . Probiotic Product (PROBIOTIC DAILY PO) Take 1 capsule by mouth daily.   Marland Kitchen senna-docusate (SENOKOT-S) 8.6-50 MG tablet Take 2 tablets by mouth daily.   No facility-administered medications prior to visit.    Review of Systems  {CBC  CMET  Lipids  Results Review (optional):23779::" "}  Objective    There were no vitals taken for this visit. {Show previous vital signs (optional):23777::" "}  Physical Exam  ***  No results found for any visits on 02/18/20.  Assessment & Plan     ***  No follow-ups on file.      {provider attestation***:1}   Lelon Huh, MD  [facility] [phone] (phone) [phone] (fax)  [facility]

## 2020-02-17 NOTE — Progress Notes (Signed)
I spoke with patient's hospice nurse. Refill requested of Norco. #120 tablets.

## 2020-02-18 ENCOUNTER — Ambulatory Visit: Payer: Self-pay | Admitting: Family Medicine

## 2020-02-22 ENCOUNTER — Telehealth: Payer: Self-pay

## 2020-02-22 NOTE — Telephone Encounter (Signed)
Copied from [place] [phone]. Topic: General - Other >> Feb 22, 2020  2:21 PM Leward Quan A wrote: Reason for CRM: Patient called to inform Dr Caryn Section that she stopped taking Chemotherapy because it made her so sick. She also state that she was placed in hospice care after she stopped the Chemotherapy. Patient stated that she forgot her last scheduled visit and would like a call back from Dr Caryn Section or his nurse to inform her on what to do next. Please call Ph# [phone]

## 2020-03-04 ENCOUNTER — Other Ambulatory Visit: Payer: Self-pay | Admitting: Oncology

## 2020-03-10 ENCOUNTER — Telehealth: Payer: Self-pay | Admitting: Hospice and Palliative Medicine

## 2020-03-10 NOTE — Telephone Encounter (Signed)
I spoke with patient's hospice nurse, April.  Patient has complained of some abdominal burning.  Could be GERD versus neuropathic involvement from her abdominal tumor.  Can increase omeprazole to 20 mg twice daily for 2 weeks.  If no improvement, could consider increasing gabapentin to 3 times daily dosing.

## 2020-03-17 ENCOUNTER — Inpatient Hospital Stay: Payer: Medicare HMO | Attending: Oncology

## 2020-03-20 ENCOUNTER — Other Ambulatory Visit: Payer: Self-pay | Admitting: *Deleted

## 2020-03-20 DIAGNOSIS — M5136 Other intervertebral disc degeneration, lumbar region: Secondary | ICD-10-CM

## 2020-03-20 MED ORDER — HYDROCODONE-ACETAMINOPHEN 7.5-325 MG PO TABS
1.0000 | ORAL_TABLET | Freq: Four times a day (QID) | ORAL | 0 refills | Status: DC | PRN
Start: 2020-03-20 — End: 2020-04-11

## 2020-03-20 NOTE — Telephone Encounter (Signed)
Patient called cancer center requesting refill of Norco 7.5-325 mg tablets every 6 hours as needed for moderate pain.  As mandated by the [place] STOP Act (Strengthen Opioid Misuse Prevention), the [place] Controlled Substance Reporting System ([place]) was reviewed for this patient. Below is the past 31-months of controlled substance prescriptions as displayed by the registry. I have personally consulted with my supervising physician, Dr. Grayland Ormond, who agrees that continuation of opiate therapy is medically appropriate at this time and agrees to provide continual monitoring, including urine/blood drug screens, as indicated. Refill is appropriate on or after 03/19/2020.  [place]CC[place] reviewed: Reviewed and acceptable    Faythe Casa, NP 03/20/2020 10:51 AM [phone]

## 2020-03-28 ENCOUNTER — Other Ambulatory Visit: Payer: Self-pay | Admitting: Family Medicine

## 2020-04-06 ENCOUNTER — Other Ambulatory Visit: Payer: Self-pay | Admitting: Hospice and Palliative Medicine

## 2020-04-06 MED ORDER — AMOXICILLIN-POT CLAVULANATE 500-125 MG PO TABS
1.0000 | ORAL_TABLET | Freq: Two times a day (BID) | ORAL | 0 refills | Status: DC
Start: 2020-04-06 — End: 2020-04-20

## 2020-04-06 NOTE — Progress Notes (Signed)
I received a call from patient's hospice nurse. UA results suspicious for UTI. +WBC, Nitrates, Bacteria. Sensitivies pending.   Will start Augmentin 500-125mg  BID x 5 days.

## 2020-04-11 ENCOUNTER — Telehealth: Payer: Self-pay | Admitting: *Deleted

## 2020-04-11 DIAGNOSIS — M5136 Other intervertebral disc degeneration, lumbar region: Secondary | ICD-10-CM

## 2020-04-11 MED ORDER — HYDROCODONE-ACETAMINOPHEN 7.5-325 MG PO TABS: 1.0000 | ORAL_TABLET | ORAL | 0 refills | Status: AC | PRN

## 2020-04-11 NOTE — Telephone Encounter (Signed)
Yes, change frequency of Norco to Q4H PRN. Will send Rx.

## 2020-04-11 NOTE — Telephone Encounter (Signed)
Kimberly [name] with hospice called reporting that patient is taking Norco 5/325 mg, 2 tablets every 6 hours as ordered, but it is not controlling her pain and is asking if the duration between doses can be changed to every 4 hours. Please advise

## 2020-04-11 NOTE — Telephone Encounter (Signed)
Left message on Aprils voice mail of approval to change frequency of Norco to every 4 hours as needed and that new prescription is being sent in

## 2020-04-17 ENCOUNTER — Emergency Department
Admission: EM | Admit: 2020-04-17 | Discharge: 2020-04-17 | Disposition: A | Discharge status: Home / Self Care | Attending: Emergency Medicine | Admitting: Emergency Medicine

## 2020-04-17 ENCOUNTER — Telehealth: Payer: Self-pay

## 2020-04-17 ENCOUNTER — Other Ambulatory Visit: Payer: Self-pay

## 2020-04-17 ENCOUNTER — Telehealth: Payer: Self-pay | Admitting: Hospice and Palliative Medicine

## 2020-04-17 DIAGNOSIS — Z5321 Procedure and treatment not carried out due to patient leaving prior to being seen by health care provider: Secondary | ICD-10-CM | POA: Insufficient documentation

## 2020-04-17 DIAGNOSIS — C184 Malignant neoplasm of transverse colon: Secondary | ICD-10-CM

## 2020-04-17 DIAGNOSIS — R319 Hematuria, unspecified: Secondary | ICD-10-CM | POA: Insufficient documentation

## 2020-04-17 LAB — URINALYSIS, COMPLETE (UACMP) WITH MICROSCOPIC
Bacteria, UA: NONE SEEN
RBC / HPF: >50 RBC/hpf — ABNORMAL HIGH (ref 0–5)
Specific Gravity, Urine: 1.027 (ref 1.005–1.030)
WBC, UA: >50 WBC/hpf — ABNORMAL HIGH (ref 0–5)

## 2020-04-17 LAB — BASIC METABOLIC PANEL
Anion gap: 10 (ref 5–15)
BUN: 18 mg/dL (ref 8–23)
CO2: 27 mmol/L (ref 22–32)
Calcium: 8.2 mg/dL — ABNORMAL LOW (ref 8.9–10.3)
Chloride: 95 mmol/L — ABNORMAL LOW (ref 98–111)
Creatinine, Ser: 0.9 mg/dL (ref 0.44–1.00)
GFR calc Af Amer: >60 mL/min (ref >60)
GFR calc non Af Amer: >60 mL/min (ref >60)
Glucose, Bld: 106 mg/dL — ABNORMAL HIGH (ref 70–99)
Potassium: 3.9 mmol/L (ref 3.5–5.1)
Sodium: 132 mmol/L — ABNORMAL LOW (ref 135–145)

## 2020-04-17 LAB — CBC
HCT: 29.8 % — ABNORMAL LOW (ref 36.0–46.0)
Hemoglobin: 9.2 g/dL — ABNORMAL LOW (ref 12.0–15.0)
MCH: 26.4 pg (ref 26.0–34.0)
MCHC: 30.9 g/dL (ref 30.0–36.0)
MCV: 85.4 fL (ref 80.0–100.0)
Platelets: 423 10*3/uL — ABNORMAL HIGH (ref 150–400)
RBC: 3.49 MIL/uL — ABNORMAL LOW (ref 3.87–5.11)
RDW: 17.5 % — ABNORMAL HIGH (ref 11.5–15.5)
WBC: 14.1 10*3/uL — ABNORMAL HIGH (ref 4.0–10.5)
nRBC: 0.1 % (ref 0.0–0.2)

## 2020-04-17 NOTE — Telephone Encounter (Signed)
I received a call from patient's hospice nurse, April, who was in the home with patient.  Also spoke with patient by phone.  Patient says that she has had frank hematuria over the past 3 to 4 days.  Nurse describes the urine as appearing "like a vial of blood."  Patient endorses progressive weakness over the past several days.  She denies fever, chills or other urinary symptoms.  Note the patient was recently treated for a UTI and completed antibiotics about a week ago. Patient was hypotensive today with systolic BP in the low 74V. Patient is on anticoagulation with Eliquis for A. Fib.  No other reported bleeding or other symptomatic complaints today.  I discussed options with patient including doing nothing and focusing on her comfort with hospice (either at home or in a hospice IPU), seeing her in the clinic for labs and supportive care, or evaluation in the ER.  Patient opted to pursue ER and hospice will coordinate.  Case and plan discussed with Dr. Tasia Catchings.

## 2020-04-17 NOTE — Telephone Encounter (Signed)
[place] message received by Pete Glatter, NP:  pt left ER after a 4 hr wait without being seen. Patient wants to come to clinic tomorrow and be evaluated for hematuria  Dr. Tasia Catchings would like for patient to be scheduled for lab/MD tomorrow (cbc cmp hold tube).  Also please schedule her for STAT CT abdomen pelvis wo contrast. thanks. - colon cancer, hematuria.

## 2020-04-17 NOTE — ED Notes (Signed)
Pt called again with no answer 

## 2020-04-17 NOTE — ED Notes (Signed)
Pt called for room and no answer. Looked outside and in bathroom for pt and no answer.

## 2020-04-17 NOTE — ED Triage Notes (Signed)
Pt comes via POV from home with c/o blood in urine for few days. Pt also states some belly pain.  Pt states this am it was bright red. Pt states blood thinners.

## 2020-04-18 ENCOUNTER — Inpatient Hospital Stay

## 2020-04-18 ENCOUNTER — Encounter: Payer: Self-pay | Admitting: Oncology

## 2020-04-18 ENCOUNTER — Inpatient Hospital Stay (HOSPITAL_BASED_OUTPATIENT_CLINIC_OR_DEPARTMENT_OTHER): Admitting: Oncology

## 2020-04-18 ENCOUNTER — Inpatient Hospital Stay (HOSPITAL_BASED_OUTPATIENT_CLINIC_OR_DEPARTMENT_OTHER): Admitting: Hospice and Palliative Medicine

## 2020-04-18 ENCOUNTER — Inpatient Hospital Stay: Attending: Oncology

## 2020-04-18 VITALS — BP 106/56 | HR 79 | Temp 99.0°F | Resp 18 | Wt 154.1 lb

## 2020-04-18 DIAGNOSIS — Z7901 Long term (current) use of anticoagulants: Secondary | ICD-10-CM | POA: Diagnosis not present

## 2020-04-18 DIAGNOSIS — R310 Gross hematuria: Secondary | ICD-10-CM | POA: Insufficient documentation

## 2020-04-18 DIAGNOSIS — Z888 Allergy status to other drugs, medicaments and biological substances status: Secondary | ICD-10-CM | POA: Insufficient documentation

## 2020-04-18 DIAGNOSIS — Z9221 Personal history of antineoplastic chemotherapy: Secondary | ICD-10-CM | POA: Diagnosis not present

## 2020-04-18 DIAGNOSIS — Z85038 Personal history of other malignant neoplasm of large intestine: Secondary | ICD-10-CM | POA: Diagnosis not present

## 2020-04-18 DIAGNOSIS — R300 Dysuria: Secondary | ICD-10-CM | POA: Diagnosis not present

## 2020-04-18 DIAGNOSIS — Z515 Encounter for palliative care: Secondary | ICD-10-CM | POA: Diagnosis not present

## 2020-04-18 DIAGNOSIS — D649 Anemia, unspecified: Secondary | ICD-10-CM

## 2020-04-18 DIAGNOSIS — Z86010 Personal history of colonic polyps: Secondary | ICD-10-CM | POA: Insufficient documentation

## 2020-04-18 DIAGNOSIS — R102 Pelvic and perineal pain: Secondary | ICD-10-CM | POA: Diagnosis not present

## 2020-04-18 DIAGNOSIS — Z923 Personal history of irradiation: Secondary | ICD-10-CM | POA: Insufficient documentation

## 2020-04-18 DIAGNOSIS — Z95828 Presence of other vascular implants and grafts: Secondary | ICD-10-CM

## 2020-04-18 DIAGNOSIS — R319 Hematuria, unspecified: Secondary | ICD-10-CM | POA: Diagnosis not present

## 2020-04-18 DIAGNOSIS — J449 Chronic obstructive pulmonary disease, unspecified: Secondary | ICD-10-CM | POA: Insufficient documentation

## 2020-04-18 DIAGNOSIS — C184 Malignant neoplasm of transverse colon: Secondary | ICD-10-CM

## 2020-04-18 DIAGNOSIS — Z79899 Other long term (current) drug therapy: Secondary | ICD-10-CM | POA: Insufficient documentation

## 2020-04-18 DIAGNOSIS — R42 Dizziness and giddiness: Secondary | ICD-10-CM | POA: Insufficient documentation

## 2020-04-18 DIAGNOSIS — Z8249 Family history of ischemic heart disease and other diseases of the circulatory system: Secondary | ICD-10-CM | POA: Diagnosis not present

## 2020-04-18 DIAGNOSIS — Z833 Family history of diabetes mellitus: Secondary | ICD-10-CM | POA: Insufficient documentation

## 2020-04-18 DIAGNOSIS — Z933 Colostomy status: Secondary | ICD-10-CM | POA: Diagnosis not present

## 2020-04-18 DIAGNOSIS — Z87891 Personal history of nicotine dependence: Secondary | ICD-10-CM | POA: Diagnosis not present

## 2020-04-18 DIAGNOSIS — M791 Myalgia, unspecified site: Secondary | ICD-10-CM | POA: Insufficient documentation

## 2020-04-18 DIAGNOSIS — K56609 Unspecified intestinal obstruction, unspecified as to partial versus complete obstruction: Secondary | ICD-10-CM | POA: Diagnosis not present

## 2020-04-18 DIAGNOSIS — Z836 Family history of other diseases of the respiratory system: Secondary | ICD-10-CM | POA: Insufficient documentation

## 2020-04-18 DIAGNOSIS — Z853 Personal history of malignant neoplasm of breast: Secondary | ICD-10-CM | POA: Diagnosis not present

## 2020-04-18 LAB — URINALYSIS, COMPLETE (UACMP) WITH MICROSCOPIC
Bacteria, UA: NONE SEEN
RBC / HPF: >50 RBC/hpf — ABNORMAL HIGH (ref 0–5)
Specific Gravity, Urine: 1.031 — ABNORMAL HIGH (ref 1.005–1.030)
Squamous Epithelial / HPF: NONE SEEN (ref 0–5)
WBC, UA: >50 WBC/hpf — ABNORMAL HIGH (ref 0–5)

## 2020-04-18 LAB — CBC WITH DIFFERENTIAL/PLATELET
Abs Immature Granulocytes: 0.05 10*3/uL (ref 0.00–0.07)
Basophils Absolute: 0.1 10*3/uL (ref 0.0–0.1)
Basophils Relative: 0 %
Eosinophils Absolute: 0.1 10*3/uL (ref 0.0–0.5)
Eosinophils Relative: 1 %
HCT: 28.3 % — ABNORMAL LOW (ref 36.0–46.0)
Hemoglobin: 8.8 g/dL — ABNORMAL LOW (ref 12.0–15.0)
Immature Granulocytes: 0 %
Lymphocytes Relative: 15 %
Lymphs Abs: 1.8 10*3/uL (ref 0.7–4.0)
MCH: 26.3 pg (ref 26.0–34.0)
MCHC: 31.1 g/dL (ref 30.0–36.0)
MCV: 84.7 fL (ref 80.0–100.0)
Monocytes Absolute: 1.4 10*3/uL — ABNORMAL HIGH (ref 0.1–1.0)
Monocytes Relative: 12 %
Neutro Abs: 8.8 10*3/uL — ABNORMAL HIGH (ref 1.7–7.7)
Neutrophils Relative %: 72 %
Platelets: 414 10*3/uL — ABNORMAL HIGH (ref 150–400)
RBC: 3.34 MIL/uL — ABNORMAL LOW (ref 3.87–5.11)
RDW: 17.3 % — ABNORMAL HIGH (ref 11.5–15.5)
WBC: 12.3 10*3/uL — ABNORMAL HIGH (ref 4.0–10.5)
nRBC: 0.2 % (ref 0.0–0.2)

## 2020-04-18 LAB — COMPREHENSIVE METABOLIC PANEL
ALT: 9 U/L (ref 0–44)
AST: 12 U/L — ABNORMAL LOW (ref 15–41)
Albumin: 3 g/dL — ABNORMAL LOW (ref 3.5–5.0)
Alkaline Phosphatase: 80 U/L (ref 38–126)
Anion gap: 8 (ref 5–15)
BUN: 18 mg/dL (ref 8–23)
CO2: 30 mmol/L (ref 22–32)
Calcium: 8.5 mg/dL — ABNORMAL LOW (ref 8.9–10.3)
Chloride: 99 mmol/L (ref 98–111)
Creatinine, Ser: 0.8 mg/dL (ref 0.44–1.00)
GFR calc Af Amer: >60 mL/min (ref >60)
GFR calc non Af Amer: >60 mL/min (ref >60)
Glucose, Bld: 146 mg/dL — ABNORMAL HIGH (ref 70–99)
Potassium: 4.1 mmol/L (ref 3.5–5.1)
Sodium: 137 mmol/L (ref 135–145)
Total Bilirubin: 0.5 mg/dL (ref 0.3–1.2)
Total Protein: 6.6 g/dL (ref 6.5–8.1)

## 2020-04-18 LAB — MAGNESIUM: Magnesium: 1.8 mg/dL (ref 1.7–2.4)

## 2020-04-18 MED ORDER — HEPARIN SOD (PORK) LOCK FLUSH 100 UNIT/ML IV SOLN
INTRAVENOUS | Status: AC
  Filled 2020-04-18: qty 5

## 2020-04-18 MED ORDER — CIPROFLOXACIN HCL 500 MG PO TABS
500.0000 mg | ORAL_TABLET | Freq: Two times a day (BID) | ORAL | 0 refills | Status: DC
Start: 2020-04-18 — End: 2020-05-04

## 2020-04-18 MED ORDER — FERROUS SULFATE 325 (65 FE) MG PO TBEC
325.0000 mg | DELAYED_RELEASE_TABLET | Freq: Two times a day (BID) | ORAL | 2 refills | Status: DC
Start: 2020-04-18 — End: 2020-05-04

## 2020-04-18 MED ORDER — SODIUM CHLORIDE 0.9% FLUSH
10.0000 mL | INTRAVENOUS | Status: DC | PRN
  Administered 2020-04-18: 10 mL via INTRAVENOUS
  Filled 2020-04-18: qty 10

## 2020-04-18 MED ORDER — HEPARIN SOD (PORK) LOCK FLUSH 100 UNIT/ML IV SOLN
500.0000 [IU] | Freq: Once | INTRAVENOUS | Status: AC
  Administered 2020-04-18: 500 [IU] via INTRAVENOUS
  Filled 2020-04-18: qty 5

## 2020-04-18 MED ORDER — SODIUM CHLORIDE 0.9 % IV SOLN
INTRAVENOUS | Status: DC
  Administered 2020-04-18: 500 mL via INTRAVENOUS
  Filled 2020-04-18 (×2): qty 250

## 2020-04-18 NOTE — Progress Notes (Signed)
Patient here reporting that she has been passing bright red blood in her urine. Patient reports having some pain with urination this morning.

## 2020-04-18 NOTE — Progress Notes (Signed)
[place]  [phone] [phone]   Name: Kimberly [name] Date: 04/18/2020 MRN: [id number]  DOB: March 28, 1946  Patient Care Team: Birdie Sons, MD as PCP - General (Family Medicine) Earlie Server, MD as Consulting Physician (Oncology) Noreene Filbert, MD as Referring Physician (Radiation Oncology) Jules Husbands, MD as Consulting Physician (General Surgery) Corey Skains, MD as Consulting Physician (Cardiology)    REASON FOR CONSULTATION: Kimberly [name] is a 74 y.o. female with multiple medical problems including recurrent stage IIIb colon cancer status post resection in 2018 with colostomy reversal in 05/2018 and status post adjuvant FOLFOX.  PET scan on 09/19/2019 revealed recurrent disease.  Patient underwent CT-guided biopsy 08/20/2019 one of her abdominal wall mass revealing metastatic adenocarcinoma of the colon.  Patient was hospitalized 12/08/2019-12/10/2019 with A. fib/RVR and was started on anticoagulation with Eliquis.  On 12/24/2019 patient had anaphylactic reaction to cetuximab requiring intubation.  Patient has decided to forego further treatment.  She was referred to hospice.   SOCIAL HISTORY:     reports that she quit smoking about 2 years ago. Her smoking use included cigarettes. She has a 13.75 pack-year smoking history. She has never used smokeless tobacco. She reports current alcohol use. She reports that she does not use drugs.   Patient is divorced.  She lives at home alone.  She has a daughter who lives nearby and a son in [place].  Patient previously worked in [employer].  ADVANCE DIRECTIVES:  Patient's daughter is her [facility] POA  CODE STATUS: DNR/DNI (MOST form completed on 01/28/2020)  PAST MEDICAL HISTORY: Past Medical History:  Diagnosis Date  . Breast cancer, left ([place]) 2004   Lumpectomy and rad tx's.  . Chronic back pain   . Colon cancer ([place]) 2018  . COPD (chronic obstructive pulmonary disease)  ([place])   . Degenerative disc disease, lumbar 04/2013  . Family history of adverse reaction to anesthesia    son arrested after anesthesia  . GERD (gastroesophageal reflux disease)   . Hypertension   . Iron deficiency anemia 05/27/2017  . Personal history of chemotherapy 2018   Colon  . Personal history of radiation therapy    Breast 2004  . Personal history of radiation therapy 2018   Colon  . Postprocedural intraabdominal abscess 09/01/2018  . Small bowel obstruction ([place]) 04/16/2017  . Wears dentures    full upper    PAST SURGICAL HISTORY:  Past Surgical History:  Procedure Laterality Date  . APPENDECTOMY  1965  . BREAST EXCISIONAL BIOPSY Left 08/01/2003   lumpectomy rad 11/04-2/28/2005  . BREAST SURGERY    . CESAREAN SECTION     x3  . COLON SURGERY    . COLONOSCOPY W/ POLYPECTOMY    . COLONOSCOPY WITH PROPOFOL N/A 04/09/2018   Procedure: COLONOSCOPY WITH PROPOFOL;  Surgeon: Virgel Manifold, MD;  Location: [facility];  Service: Gastroenterology;  Laterality: N/A;  . COLONOSCOPY WITH PROPOFOL N/A 08/13/2019   Procedure: COLONOSCOPY WITH PROPOFOL;  Surgeon: Virgel Manifold, MD;  Location: [place];  Service: Endoscopy;  Laterality: N/A;  . COLOSTOMY TAKEDOWN N/A 06/25/2018   Procedure: COLOSTOMY TAKEDOWN;  Surgeon: Jules Husbands, MD;  Location: [facility];  Service: General;  Laterality: N/A;  . ESOPHAGOGASTRODUODENOSCOPY (EGD) WITH PROPOFOL N/A 04/04/2017   Procedure: ESOPHAGOGASTRODUODENOSCOPY (EGD) WITH PROPOFOL;  Surgeon: [name], MD;  Location: [place];  Service: Endoscopy;  Laterality: N/A;  . FRACTURE SURGERY    . HIP  FRACTURE SURGERY Left 01/25/2012  . LAPAROSCOPY N/A 12/07/2019   Procedure: LAPAROSCOPY DIAGNOSTIC;  Surgeon: Jules Husbands, MD;  Location: [facility];  Service: General;  Laterality: N/A;  . LAPAROTOMY N/A 04/15/2017   Procedure: EXPLORATORY LAPAROTOMY;  Surgeon: Clayburn Pert, MD;  Location: [facility];  Service: General;   Laterality: N/A;  . NECK SURGERY  12/2011  . PARASTOMAL HERNIA REPAIR N/A 12/03/2017   Procedure: HERNIA REPAIR PARASTOMAL;  Surgeon: Jules Husbands, MD;  Location: [facility];  Service: General;  Laterality: N/A;  . PARASTOMAL HERNIA REPAIR N/A 06/25/2018   Procedure: HERNIA REPAIR PARASTOMAL;  Surgeon: Jules Husbands, MD;  Location: [facility];  Service: General;  Laterality: N/A;  . PORTACATH PLACEMENT Right 05/21/2017   Procedure: INSERTION PORT-A-CATH;  Surgeon: Clayburn Pert, MD;  Location: [facility];  Service: General;  Laterality: Right;  . PORTACATH PLACEMENT Right 12/07/2019   Procedure: INSERTION PORT-A-CATH;  Surgeon: Jules Husbands, MD;  Location: [facility];  Service: General;  Laterality: Right;  . WRIST FRACTURE SURGERY      HEMATOLOGY/ONCOLOGY HISTORY:  Oncology History  Malignant neoplasm of colon ([place])  04/26/2017 Initial Diagnosis   Malignant neoplasm of transverse colon ([place])   11/06/2019 - 11/06/2019 Chemotherapy   The patient had palonosetron (ALOXI) injection 0.25 mg, 0.25 mg, Intravenous,  Once, 0 of 4 cycles irinotecan (CAMPTOSAR) 320 mg in sodium chloride 0.9 % 500 mL chemo infusion, 180 mg/m2, Intravenous,  Once, 0 of 4 cycles fluorouracil (ADRUCIL) chemo injection 750 mg, 400 mg/m2, Intravenous,  Once, 0 of 4 cycles fluorouracil (ADRUCIL) 4,350 mg in sodium chloride 0.9 % 63 mL chemo infusion, 2,400 mg/m2, Intravenous, 1 Day/Dose, 0 of 4 cycles bevacizumab-bvzr (ZIRABEV) 400 mg in sodium chloride 0.9 % 100 mL chemo infusion, 5 mg/kg, Intravenous,  Once, 0 of 4 cycles leucovorin 728 mg in sodium chloride 0.9 % 250 mL infusion, 400 mg/m2, Intravenous,  Once, 0 of 4 cycles  for chemotherapy treatment.    12/24/2019 - 12/24/2019 Chemotherapy   The patient had cetuximab (ERBITUX) chemo infusion 700 mg, 400 mg/m2 = 700 mg, Intravenous,  Once, 1 of 4 cycles Administration: 700 mg (12/24/2019)  for chemotherapy treatment.    01/07/2020 -  Chemotherapy   The patient had panitumumab  (VECTIBIX) 400 mg in sodium chloride 0.9 % 100 mL chemo infusion, 6 mg/kg = 400 mg, Intravenous,  Once, 1 of 1 cycle Administration: 400 mg (01/07/2020) panitumumab (VECTIBIX) 400 mg in sodium chloride 0.9 % 100 mL chemo infusion, 6 mg/kg, Intravenous,  Once, 0 of 3 cycles  for chemotherapy treatment.      ALLERGIES:  is allergic to erbitux [cetuximab], betadine [povidone iodine], iodine, other, and sinus formula [cholestatin].  MEDICATIONS:  Current Outpatient Medications  Medication Sig Dispense Refill  . amoxicillin-clavulanate (AUGMENTIN) 500-125 MG tablet Take 1 tablet (500 mg total) by mouth 2 (two) times daily. (Patient not taking: Reported on 04/18/2020) 10 tablet 0  . apixaban (ELIQUIS) 5 MG TABS tablet Take 1 tablet (5 mg total) by mouth 2 (two) times daily. 60 tablet 3  . budesonide-formoterol (SYMBICORT) 80-4.5 MCG/ACT inhaler Inhale 2 puffs into the lungs 2 (two) times daily. 3 Inhaler 3  . calcium citrate-vitamin D (CITRACAL+D) 315-200 MG-UNIT tablet Take 1 tablet by mouth daily.    . Cholecalciferol (VITAMIN D3) 50 MCG (2000 UT) TABS Take 2,000 Units by mouth daily.    . diphenhydrAMINE (BENADRYL) 25 MG tablet Take 2 tablets (50 mg total) by mouth at bedtime as needed for  itching or allergies. 30 tablet 0  . EPINEPHrine 0.3 mg/0.3 mL IJ SOAJ injection Inject 0.3 mLs (0.3 mg total) into the muscle as needed for anaphylaxis. 1 each 0  . ferrous sulfate 325 (65 FE) MG EC tablet Take 1 tablet (325 mg total) by mouth 2 (two) times daily with a meal. 60 tablet 2  . furosemide (LASIX) 20 MG tablet TAKE 1 TABLET BY MOUTH EVERY DAY AS NEEDED 30 tablet 3  . gabapentin (NEURONTIN) 400 MG capsule TAKE 1 CAPSULE THREE TIMES DAILY (Patient taking differently: Take 400 mg by mouth 2 (two) times daily. ) 270 capsule 1  . HYDROcodone-acetaminophen (NORCO) 7.5-325 MG tablet Take 1-2 tablets by mouth every 4 (four) hours as needed for moderate pain. 120 tablet 0  . LORazepam (ATIVAN) 0.5 MG tablet  Take 0.5 mg by mouth every 4 (four) hours as needed.    . magnesium chloride (SLOW-MAG) 64 MG TBEC SR tablet Take 1 tablet (64 mg total) by mouth daily. 7 tablet 0  . metoprolol succinate (TOPROL-XL) 100 MG 24 hr tablet TAKE 1 TABLET (100 MG TOTAL) BY MOUTH DAILY. TAKE WITH OR IMMEDIATELY FOLLOWING A MEAL. 90 tablet 4  . Multiple Vitamins-Minerals (WOMENS MULTI VITAMIN & MINERAL PO) Take 2 tablets by mouth daily.     Marland Kitchen omeprazole (PRILOSEC) 20 MG capsule Take 1 capsule (20 mg total) by mouth daily. 90 capsule 4  . ondansetron (ZOFRAN) 8 MG tablet Take by mouth.    . oxyCODONE-acetaminophen (PERCOCET) 5-325 MG tablet Take 1 tablet by mouth every 4 (four) hours as needed for severe pain. (Patient not taking: Reported on 04/18/2020) 15 tablet 0  . pravastatin (PRAVACHOL) 40 MG tablet TAKE 1 TABLET EVERY DAY (Patient taking differently: Take 40 mg by mouth daily. ) 90 tablet 4  . Probiotic Product (PROBIOTIC DAILY PO) Take 1 capsule by mouth daily.     Marland Kitchen senna-docusate (SENOKOT-S) 8.6-50 MG tablet Take 2 tablets by mouth daily. 60 tablet 1   No current facility-administered medications for this visit.   Facility-Administered Medications Ordered in Other Visits  Medication Dose Route Frequency Provider Last Rate Last Admin  . 0.9 %  sodium chloride infusion   Intravenous Continuous Earlie Server, MD 500 mL/hr at 04/18/20 1441 500 mL at 04/18/20 1441    VITAL SIGNS: There were no vitals taken for this visit. There were no vitals filed for this visit.  Estimated body mass index is 26.45 kg/m as calculated from the following:   Height as of 04/17/20: 5\' 4"  (1.626 m).   Weight as of an earlier encounter on 04/18/20: 154 lb 1.6 oz (69.9 kg).  LABS: CBC:    Component Value Date/Time   WBC 12.3 (H) 04/18/2020 1309   HGB 8.8 (L) 04/18/2020 1309   HGB 12.2 01/02/2017 1410   HCT 28.3 (L) 04/18/2020 1309   HCT 37.3 01/02/2017 1410   PLT 414 (H) 04/18/2020 1309   PLT 486 (H) 01/02/2017 1410   MCV 84.7  04/18/2020 1309   MCV 86 01/02/2017 1410   MCV 96 01/29/2012 0428   NEUTROABS 8.8 (H) 04/18/2020 1309   NEUTROABS 7.8 (H) 01/02/2017 1410   NEUTROABS 9.2 (H) 01/29/2012 0428   LYMPHSABS 1.8 04/18/2020 1309   LYMPHSABS 3.9 (H) 01/02/2017 1410   LYMPHSABS 1.9 01/29/2012 0428   MONOABS 1.4 (H) 04/18/2020 1309   MONOABS 2.0 (H) 01/29/2012 0428   EOSABS 0.1 04/18/2020 1309   EOSABS 0.4 01/02/2017 1410   EOSABS 0.1 01/29/2012 0428  BASOSABS 0.1 04/18/2020 1309   BASOSABS 0.1 01/02/2017 1410   BASOSABS 0.1 01/29/2012 0428   Comprehensive Metabolic Panel:    Component Value Date/Time   NA 137 04/18/2020 1309   NA 141 01/02/2017 1410   NA 136 01/27/2012 0637   K 4.1 04/18/2020 1309   K 4.3 01/27/2012 0637   CL 99 04/18/2020 1309   CL 102 01/27/2012 0637   CO2 30 04/18/2020 1309   CO2 25 01/27/2012 0637   BUN 18 04/18/2020 1309   BUN 14 01/02/2017 1410   BUN 15 01/27/2012 0637   CREATININE 0.80 04/18/2020 1309   CREATININE 0.67 01/27/2012 0637   GLUCOSE 146 (H) 04/18/2020 1309   GLUCOSE 119 (H) 01/27/2012 0637   CALCIUM 8.5 (L) 04/18/2020 1309   CALCIUM 7.7 (L) 01/27/2012 0637   AST 12 (L) 04/18/2020 1309   AST 17 01/26/2012 0021   ALT 9 04/18/2020 1309   ALT 19 01/26/2012 0021   ALKPHOS 80 04/18/2020 1309   ALKPHOS 87 01/26/2012 0021   BILITOT 0.5 04/18/2020 1309   BILITOT <0.2 01/02/2017 1410   BILITOT 0.2 01/26/2012 0021   PROT 6.6 04/18/2020 1309   PROT 7.0 01/02/2017 1410   PROT 7.6 01/26/2012 0021   ALBUMIN 3.0 (L) 04/18/2020 1309   ALBUMIN 3.9 01/02/2017 1410   ALBUMIN 3.5 01/26/2012 0021    RADIOGRAPHIC STUDIES: No results found.  PERFORMANCE STATUS (ECOG) : 1 - Symptomatic but completely ambulatory  Review of Systems Unless otherwise noted, a complete review of systems is negative.  Physical Exam General: NAD Pulmonary: Unlabored Extremities: no edema, no joint deformities Skin: no rashes Neurological: Weakness but otherwise  nonfocal  IMPRESSION: Patient was seen in the clinic following her visit with Dr. Tasia Catchings.  Patient was an acute add-on today due to hematuria.  Patient reports that hematuria started late last week and has persisted over the past 4 to 5 days.  She says today was actually better with clearer urine but still blood tinged.  Patient denies fever or chills.  No other urinary symptoms.  Patient is pending CT.  Dr. Tasia Catchings is discontinuing patient's Eliquis.  Discussed with Dr. Alferd Patee with hospice.  She has chronic pelvic pain from her cancer.  She says that the Percocet helps but that she feels that could be stronger.  We discussed option of increasing Percocet to 10-325 mg that she just had this refilled and she would like to use her current supply of before making any changes.  PLAN: -Best supportive care with hospice -We will consider increasing Percocet to 10-325mg  at time of next refill -RTC as needed  Case and plan discussed with Dr. Tasia Catchings   Patient expressed understanding and was in agreement with this plan. She also understands that She can call the clinic at any time with any questions, concerns, or complaints.     Time Total: 25 minutes  Visit consisted of counseling and education dealing with the complex and emotionally intense issues of symptom management and palliative care in the setting of serious and potentially life-threatening illness.Greater than 50%  of this time was spent counseling and coordinating care related to the above assessment and plan.  Signed by: Altha Harm, PhD, NP-C

## 2020-04-18 NOTE — Telephone Encounter (Signed)
Patient was seen by Dr. Tasia Catchings today (04/18/20)

## 2020-04-18 NOTE — Progress Notes (Signed)
[facility]  Visit:  Patient Care Team: Birdie Sons, MD as PCP - General (Family Medicine) Earlie Server, MD as Consulting Physician (Oncology) Noreene Filbert, MD as Referring Physician (Radiation Oncology) Jules Husbands, MD as Consulting Physician (General Surgery) Corey Skains, MD as Consulting Physician (Cardiology)  PURPOSE OF VISIT: Stage IV colon cancer  HISTORY OF PRESENTING ILLNESS: Kimberly [name] 74 y.o. female with PMH listed as below presents for follow up for the management of Stage IIIB Colon Cancer Patient reports a remote history of breast cancer s/p lumpectomy and radiation treatments.   Patient had been having abdominal pain, weight loss and bloating.  CT scan showed partial obstruction at the level of transverse colon and ileocolic mesenteric adenopathy. She was prepared for a colonoscopy on 04/15/2017. She started to have worsened abdominal pain, nausea vomiting, unable to maintain oral intake and was sent to ED. She was found to have bowel obstruction and underwent  exploratory laparotomy with transverse colectomy, with end colostomy and mucous fistula formation for obstructing colon lesion. Differential diagnosis prior to surgery was metastatic breast cancer in colon vs colon cancer. The patient did have a colonoscopy 2 years ago without the mass being present at that time but did have 2 small polyps in the transverse colon that were removed.  04/15/2017 Patient underwent transverse colon resection.  Pathology showed T4aN1 moderate differentiated adenocarcinoma, Grade 2, tumor invades visceral peritoneum, LVI present, negative margin, 3/16 lymph nodes involved with cancer. Low probability of MSI-H.   # Genetic test INVITAE came back negative. No pathogenetic sequence variants or deletions/duplications identified. Results was scanned to [facility] (a copy of the report was provided to patient and her daughter)  # # baseline CEA is 2.1 #  colostomy reversal on 06/25/2018, subsequently developed C. difficile colitis x 2.  Multiple abdominal CT abdomen and pelvis for obtained, not listed here #09/01/2018 CT abdomen pelvis showed abdominal wall abscess,  percutaneous drain placement .treatment with antibiotics with Augmentin 10 days course.  #09/14/2018 CT abdomen/pelvis showed resolution of abscess, no intraabdominal abscess.  Mediport was discontinued. # 08/13/2019 colonoscopy showed localized area of mildly thickened folds of the mucosa was found in the sigmoid colon. Biopsy pathology showed benign mucosa with lymphoid aggregates and focal minimal inflammation. No dysplasia.   TREATMENT:  04/15/2017 transverse colon resection 05/27/2017- 11/17/2017 adjuvant FOLFOX, Oxaliplatin was discontinued after 3 cycles due to worsening of pre-existing neuropathy. Finished another 9 cycles of 5-FU.  March -April 2019 adjuvant RT.  06/25/2018 Colostomy Reversal.   09/21/2019 PET scan showed 2 small right abdominal mesenteric soft tissue nodule which showed no significant change in size compared to recent CT but hypermetabolic.  Peritoneal metastatic disease cannot be excluded. 1.1 cm hypermetabolic nodule in the right parotid gland suspicious for primary parotid neoplasm.  08/27/2020, CT-guided biopsy of abdominal wall mass was obtained. Results showed metastatic adenocarcinoma, compatible with colon primary.  # 12/07/2019 She underwent surgery for laparoscopic evaluation of peritoneum and placement of Mediport. # 12/08/2019- 12/10/2019  She was admitted due to atrial fibrillation.  Patient was started on anticoagulation with Eliquis 5 mg twice daily.  Patient was reverted back to normal sinus rhythm.  She is on metoprolol for rate control.  # 12/24/2019 She had anaphylactic reaction to Cetuximab She got epinephrine. Was not intubated.  # s/p  Panitumumab status post 1 treatment and Encorafenib 150 mg daily  INTERVAL HISTORY 74 y.o. female  presents  for follow-up of evaluation of  hematuria. Patient has been on hospice since April 2021. She reports having gross hematuria for the past few days.  She also had dysuria symptoms.  She was treated recently with a course of Augmentin for 10 days, no significant symptom improvement. Also feel lightheaded. Appetite is fair. She complains lower pelvic pain, she takes Norco as needed   Review of Systems  Constitutional: Positive for fatigue. Negative for appetite change, chills, fever and unexpected weight change.  HENT:   Negative for hearing loss, lump/mass, nosebleeds and voice change.   Eyes: Negative for eye problems.  Respiratory: Negative for chest tightness, cough and shortness of breath.   Cardiovascular: Negative for chest pain and leg swelling.  Gastrointestinal: Negative for abdominal distention, abdominal pain, blood in stool, constipation, diarrhea and nausea.  Endocrine: Negative for hot flashes.  Genitourinary: Positive for dysuria and hematuria. Negative for difficulty urinating and frequency.   Musculoskeletal: Positive for myalgias. Negative for arthralgias and gait problem.  Skin: Negative for itching, rash and wound.  Neurological: Negative for dizziness, extremity weakness, gait problem and headaches.       Chronic numbness and tingling of fingertips and toes.  Hematological: Negative for adenopathy. Does not bruise/bleed easily.  Psychiatric/Behavioral: Negative for confusion, decreased concentration and depression. The patient is not nervous/anxious.       MEDICAL HISTORY: Past Medical History:  Diagnosis Date  . Breast cancer, left ([place]) 2004   Lumpectomy and rad tx's.  . Chronic back pain   . Colon cancer ([place]) 2018  . COPD (chronic obstructive pulmonary disease) ([place])   . Degenerative disc disease, lumbar 04/2013  . Family history of adverse reaction to anesthesia    son arrested after anesthesia  . GERD (gastroesophageal reflux disease)   . Hypertension   .  Iron deficiency anemia 05/27/2017  . Personal history of chemotherapy 2018   Colon  . Personal history of radiation therapy    Breast 2004  . Personal history of radiation therapy 2018   Colon  . Postprocedural intraabdominal abscess 09/01/2018  . Small bowel obstruction ([place]) 04/16/2017  . Wears dentures    full upper    SURGICAL HISTORY: Past Surgical History:  Procedure Laterality Date  . APPENDECTOMY  1965  . BREAST EXCISIONAL BIOPSY Left 08/01/2003   lumpectomy rad 11/04-2/28/2005  . BREAST SURGERY    . CESAREAN SECTION     x3  . COLON SURGERY    . COLONOSCOPY W/ POLYPECTOMY    . COLONOSCOPY WITH PROPOFOL N/A 04/09/2018   Procedure: COLONOSCOPY WITH PROPOFOL;  Surgeon: Virgel Manifold, MD;  Location: [facility];  Service: Gastroenterology;  Laterality: N/A;  . COLONOSCOPY WITH PROPOFOL N/A 08/13/2019   Procedure: COLONOSCOPY WITH PROPOFOL;  Surgeon: Virgel Manifold, MD;  Location: [place];  Service: Endoscopy;  Laterality: N/A;  . COLOSTOMY TAKEDOWN N/A 06/25/2018   Procedure: COLOSTOMY TAKEDOWN;  Surgeon: Jules Husbands, MD;  Location: [facility];  Service: General;  Laterality: N/A;  . ESOPHAGOGASTRODUODENOSCOPY (EGD) WITH PROPOFOL N/A 04/04/2017   Procedure: ESOPHAGOGASTRODUODENOSCOPY (EGD) WITH PROPOFOL;  Surgeon: [name], MD;  Location: [place];  Service: Endoscopy;  Laterality: N/A;  . FRACTURE SURGERY    . HIP FRACTURE SURGERY Left 01/25/2012  . LAPAROSCOPY N/A 12/07/2019   Procedure: LAPAROSCOPY DIAGNOSTIC;  Surgeon: Jules Husbands, MD;  Location: [facility];  Service: General;  Laterality: N/A;  . LAPAROTOMY N/A 04/15/2017   Procedure: EXPLORATORY LAPAROTOMY;  Surgeon: Clayburn Pert, MD;  Location: [facility];  Service: General;  Laterality: N/A;  . NECK SURGERY  12/2011  . PARASTOMAL HERNIA REPAIR N/A 12/03/2017   Procedure: HERNIA REPAIR PARASTOMAL;  Surgeon: Jules Husbands, MD;  Location: [facility];  Service: General;  Laterality:  N/A;  . PARASTOMAL HERNIA REPAIR N/A 06/25/2018   Procedure: HERNIA REPAIR PARASTOMAL;  Surgeon: Jules Husbands, MD;  Location: [facility];  Service: General;  Laterality: N/A;  . PORTACATH PLACEMENT Right 05/21/2017   Procedure: INSERTION PORT-A-CATH;  Surgeon: Clayburn Pert, MD;  Location: [facility];  Service: General;  Laterality: Right;  . PORTACATH PLACEMENT Right 12/07/2019   Procedure: INSERTION PORT-A-CATH;  Surgeon: Jules Husbands, MD;  Location: [facility];  Service: General;  Laterality: Right;  . WRIST FRACTURE SURGERY      SOCIAL HISTORY: Social History   Socioeconomic History  . Marital status: Divorced    Spouse name: Not on file  . Number of children: 3  . Years of education: Not on file  . Highest education level: 7th grade  Occupational History  . Occupation: retired  Tobacco Use  . Smoking status: Former Smoker    Packs/day: 0.25    Years: 55.00    Pack years: 13.75    Types: Cigarettes    Quit date: 05/21/2017    Years since quitting: 2.9  . Smokeless tobacco: Never Used  . Tobacco comment: started age 70 1/2 to 1 ppd  Vaping Use  . Vaping Use: Former  Substance and Sexual Activity  . Alcohol use: Yes    Alcohol/week: 0.0 standard drinks    Comment: occasionally drinks beer / 1 month  . Drug use: No  . Sexual activity: Never  Other Topics Concern  . Not on file  Social History Narrative   Lives at home alone   Social Determinants of Health   Financial Resource Strain: Low Risk   . Difficulty of Paying Living Expenses: Not hard at all  Food Insecurity: No Food Insecurity  . Worried About Charity fundraiser in the Last Year: Never true  . Ran Out of Food in the Last Year: Never true  Transportation Needs: No Transportation Needs  . Lack of Transportation (Medical): No  . Lack of Transportation (Non-Medical): No  Physical Activity: Inactive  . Days of Exercise per Week: 0 days  . Minutes of Exercise per Session: 0 min  Stress: No Stress Concern  Present  . Feeling of Stress : Not at all  Social Connections: Socially Isolated  . Frequency of Communication with Friends and Family: More than three times a week  . Frequency of Social Gatherings with Friends and Family: Once a week  . Attends Religious Services: Never  . Active Member of Clubs or Organizations: No  . Attends Archivist Meetings: Never  . Marital Status: Divorced  Human resources officer Violence: Not At Risk  . Fear of Current or Ex-Partner: No  . Emotionally Abused: No  . Physically Abused: No  . Sexually Abused: No    FAMILY HISTORY Family History  Problem Relation Age of Onset  . Diabetes Mother        type 2  . Coronary artery disease Mother   . Deep vein thrombosis Mother   . Colon cancer Father   . Asthma Brother   . Diabetes Brother        type 2  . Breast cancer Neg Hx     ALLERGIES:  is allergic to erbitux [cetuximab], betadine [povidone iodine], iodine, other, and sinus formula [cholestatin].  MEDICATIONS:  Current Outpatient Medications  Medication Sig Dispense Refill  . apixaban (ELIQUIS) 5 MG TABS tablet Take 1 tablet (5 mg total) by mouth 2 (two) times daily. 60 tablet 3  . budesonide-formoterol (SYMBICORT) 80-4.5 MCG/ACT inhaler Inhale 2 puffs into the lungs 2 (two) times daily. 3 Inhaler 3  . calcium citrate-vitamin D (CITRACAL+D) 315-200 MG-UNIT tablet Take 1 tablet by mouth daily.    . Cholecalciferol (VITAMIN D3) 50 MCG (2000 UT) TABS Take 2,000 Units by mouth daily.    . diphenhydrAMINE (BENADRYL) 25 MG tablet Take 2 tablets (50 mg total) by mouth at bedtime as needed for itching or allergies. 30 tablet 0  . EPINEPHrine 0.3 mg/0.3 mL IJ SOAJ injection Inject 0.3 mLs (0.3 mg total) into the muscle as needed for anaphylaxis. 1 each 0  . furosemide (LASIX) 20 MG tablet TAKE 1 TABLET BY MOUTH EVERY DAY AS NEEDED 30 tablet 3  . gabapentin (NEURONTIN) 400 MG capsule TAKE 1 CAPSULE THREE TIMES DAILY (Patient taking differently: Take 400  mg by mouth 2 (two) times daily. ) 270 capsule 1  . HYDROcodone-acetaminophen (NORCO) 7.5-325 MG tablet Take 1-2 tablets by mouth every 4 (four) hours as needed for moderate pain. 120 tablet 0  . LORazepam (ATIVAN) 0.5 MG tablet Take 0.5 mg by mouth every 4 (four) hours as needed.    . magnesium chloride (SLOW-MAG) 64 MG TBEC SR tablet Take 1 tablet (64 mg total) by mouth daily. 7 tablet 0  . metoprolol succinate (TOPROL-XL) 100 MG 24 hr tablet TAKE 1 TABLET (100 MG TOTAL) BY MOUTH DAILY. TAKE WITH OR IMMEDIATELY FOLLOWING A MEAL. 90 tablet 4  . Multiple Vitamins-Minerals (WOMENS MULTI VITAMIN & MINERAL PO) Take 2 tablets by mouth daily.     Marland Kitchen omeprazole (PRILOSEC) 20 MG capsule Take 1 capsule (20 mg total) by mouth daily. 90 capsule 4  . ondansetron (ZOFRAN) 8 MG tablet Take by mouth.    . pravastatin (PRAVACHOL) 40 MG tablet TAKE 1 TABLET EVERY DAY (Patient taking differently: Take 40 mg by mouth daily. ) 90 tablet 4  . Probiotic Product (PROBIOTIC DAILY PO) Take 1 capsule by mouth daily.     Marland Kitchen senna-docusate (SENOKOT-S) 8.6-50 MG tablet Take 2 tablets by mouth daily. 60 tablet 1  . amoxicillin-clavulanate (AUGMENTIN) 500-125 MG tablet Take 1 tablet (500 mg total) by mouth 2 (two) times daily. (Patient not taking: Reported on 04/18/2020) 10 tablet 0  . ferrous sulfate 325 (65 FE) MG EC tablet Take 1 tablet (325 mg total) by mouth 2 (two) times daily with a meal. 60 tablet 2  . oxyCODONE-acetaminophen (PERCOCET) 5-325 MG tablet Take 1 tablet by mouth every 4 (four) hours as needed for severe pain. (Patient not taking: Reported on 04/18/2020) 15 tablet 0   Current Facility-Administered Medications  Medication Dose Route Frequency Provider Last Rate Last Admin  . 0.9 %  sodium chloride infusion   Intravenous Continuous Earlie Server, MD   Stopped at 04/18/20 1547   Facility-Administered Medications Ordered in Other Visits  Medication Dose Route Frequency Provider Last Rate Last Admin  . sodium chloride  flush (NS) 0.9 % injection 10 mL  10 mL Intravenous PRN Earlie Server, MD   10 mL at 04/18/20 1440    PHYSICAL EXAMINATION:  ECOG PERFORMANCE STATUS: 1 - Symptomatic but completely ambulatory  Vitals:   04/18/20 1333  BP: (!) 106/56  Pulse: 79  Resp: 18  Temp: 99 F (37.2 C)  SpO2: 95%   Filed  Weights   04/18/20 1333  Weight: 154 lb 1.6 oz (69.9 kg)    Physical Exam Constitutional:      General: She is not in acute distress.    Appearance: She is not diaphoretic.  HENT:     Head: Normocephalic and atraumatic.     Nose: Nose normal.     Mouth/Throat:     Pharynx: No oropharyngeal exudate.  Eyes:     General: No scleral icterus.    Pupils: Pupils are equal, round, and reactive to light.  Cardiovascular:     Rate and Rhythm: Normal rate and regular rhythm.     Heart sounds: No murmur heard.   Pulmonary:     Effort: Pulmonary effort is normal. No respiratory distress.     Breath sounds: No rales.  Chest:     Chest wall: No tenderness.  Abdominal:     General: There is no distension.     Palpations: Abdomen is soft.     Tenderness: There is no abdominal tenderness.     Comments: Palpable abdominal wall mass,    Musculoskeletal:        General: Normal range of motion.     Cervical back: Normal range of motion and neck supple.  Skin:    General: Skin is warm and dry.     Coloration: Skin is pale.     Findings: No erythema.     Comments: Left chest wall Mediport  Neurological:     Mental Status: She is alert and oriented to person, place, and time.     Cranial Nerves: No cranial nerve deficit.     Motor: No abnormal muscle tone.     Coordination: Coordination normal.  Psychiatric:        Mood and Affect: Affect normal.       LABORATORY DATA: I have personally reviewed the data as listed: CBC Latest Ref Rng & Units 04/18/2020 04/17/2020 01/22/2020  WBC 4.0 - 10.5 K/uL 12.3(H) 14.1(H) 9.7  Hemoglobin 12.0 - 15.0 g/dL 8.8(L) 9.2(L) 11.9(L)  Hematocrit 36 - 46 %  28.3(L) 29.8(L) 38.2  Platelets 150 - 400 K/uL 414(H) 423(H) 405(H)   CMP Latest Ref Rng & Units 04/18/2020 04/17/2020 01/22/2020  Glucose 70 - 99 mg/dL 146(H) 106(H) 96  BUN 8 - 23 mg/dL '18 18 13  ' Creatinine 0.44 - 1.00 mg/dL 0.80 0.90 0.69  Sodium 135 - 145 mmol/L 137 132(L) 135  Potassium 3.5 - 5.1 mmol/L 4.1 3.9 3.5  Chloride 98 - 111 mmol/L 99 95(L) 100  CO2 22 - 32 mmol/L '30 27 27  ' Calcium 8.9 - 10.3 mg/dL 8.5(L) 8.2(L) 8.0(L)  Total Protein 6.5 - 8.1 g/dL 6.6 - -  Total Bilirubin 0.3 - 1.2 mg/dL 0.5 - -  Alkaline Phos 38 - 126 U/L 80 - -  AST 15 - 41 U/L 12(L) - -  ALT 0 - 44 U/L 9 - -   RADIOGRAPHIC STUDIES: I have personally reviewed the radiological images as listed and agreed with the findings in the report. No results found.    ASSESSMENT/PLAN 73yo female who has recurrent Colon Cancer presents for follow up  Cancer Staging Malignant neoplasm of colon [facility]) Staging form: Colon and Rectum, AJCC 8th Edition - Clinical stage from 04/15/2017: Stage IIIB (cT4a, cN1b, cM0) - Signed by Earlie Server, MD on 04/26/2017  1. Hematuria, unspecified type   2. Lightheaded   3. Malignant neoplasm of transverse colon ([place])   4. Anemia, unspecified type    #  Recurrent colon cancer with abdominal wall and mesentery nodule.    BRAF V600 E mutation and ATM mutation. Patient declined treatment and is on hospice.  Hematuria, dysuria Patient has been advised to go to emergency room which she did however due to the prolonged waiting time, she left without being seen. Check UA, urine culture.  UA showed gross hematuria, RBC more than 50.  Culture is pending. I will start empiric antibiotics for presumed UTI.  Cipro 500 mg Check CT abdomen pelvis with contrast.  #Lightheaded, blood pressure is borderline.  Patient received IV fluid 500 cc x 1. #Normocytic anemia, hemoglobin dropped to 8.8, thrombocytosis, likely reactive. Advised patient to restart oral iron supplementation.  Prescription was  sent to pharmacy. #Goals of care, patient prefers to remain on hospice. Continue palliative care.  We spent sufficient time to discuss many aspect of care, questions were answered to patient's satisfaction.  Earlie Server, MD, PhD Hematology Oncology [facility] Pager- [phone] 04/18/20

## 2020-04-19 ENCOUNTER — Other Ambulatory Visit: Payer: Self-pay

## 2020-04-19 ENCOUNTER — Ambulatory Visit
Admission: RE | Admit: 2020-04-19 | Discharge: 2020-04-19 | Disposition: A | Discharge status: Home / Self Care | Source: Ambulatory Visit | Attending: Oncology | Admitting: Oncology

## 2020-04-19 ENCOUNTER — Telehealth: Payer: Self-pay

## 2020-04-19 DIAGNOSIS — N3289 Other specified disorders of bladder: Secondary | ICD-10-CM | POA: Diagnosis not present

## 2020-04-19 DIAGNOSIS — C184 Malignant neoplasm of transverse colon: Secondary | ICD-10-CM | POA: Diagnosis not present

## 2020-04-19 DIAGNOSIS — N858 Other specified noninflammatory disorders of uterus: Secondary | ICD-10-CM | POA: Diagnosis not present

## 2020-04-19 DIAGNOSIS — R319 Hematuria, unspecified: Secondary | ICD-10-CM | POA: Diagnosis not present

## 2020-04-19 DIAGNOSIS — D3501 Benign neoplasm of right adrenal gland: Secondary | ICD-10-CM | POA: Diagnosis not present

## 2020-04-19 DIAGNOSIS — I700 Atherosclerosis of aorta: Secondary | ICD-10-CM | POA: Diagnosis not present

## 2020-04-19 LAB — CEA: CEA: 22.3 ng/mL — ABNORMAL HIGH (ref 0.0–4.7)

## 2020-04-19 MED ORDER — IOHEXOL 300 MG/ML  SOLN
125.0000 mL | Freq: Once | INTRAMUSCULAR | Status: AC | PRN
  Administered 2020-04-19: 125 mL via INTRAVENOUS

## 2020-04-19 NOTE — Telephone Encounter (Signed)
Order was changed to STAT this morning.  Daleen Bo, scheduler, confirmed with Tonita Phoenix in insurance:   No actual Josem Kaufmann # will be needed for Hospice. [employer] Auth# [id number] valid 04/19/20-05/18/20

## 2020-04-19 NOTE — Telephone Encounter (Signed)
[place] received from MD (04/18/20 @ 8:46 pm):  talked to her this evening. better after fluid, but still ongoing severe hematuria. i put her on empiric antibiotics. please move her CT to be done ASAP. STAT is ok.

## 2020-04-19 NOTE — Telephone Encounter (Signed)
Per Kimberly [name]. They can get her in at 2:30, arrive by 2:15.  Pt has been notified. And she hasnt eaten anything yet so she will be able to be NPO for the scan

## 2020-04-20 ENCOUNTER — Encounter: Payer: Self-pay | Admitting: Urology

## 2020-04-20 ENCOUNTER — Ambulatory Visit (INDEPENDENT_AMBULATORY_CARE_PROVIDER_SITE_OTHER): Payer: Medicare Other | Admitting: Urology

## 2020-04-20 ENCOUNTER — Telehealth: Payer: Self-pay | Admitting: Hospice and Palliative Medicine

## 2020-04-20 VITALS — BP 115/71 | HR 72 | Ht 64.0 in | Wt 154.0 lb

## 2020-04-20 DIAGNOSIS — R310 Gross hematuria: Secondary | ICD-10-CM | POA: Diagnosis not present

## 2020-04-20 LAB — URINE CULTURE: Culture: 50000 — AB

## 2020-04-20 LAB — BLADDER SCAN AMB NON-IMAGING: Scan Result: 0

## 2020-04-20 NOTE — Progress Notes (Signed)
04/20/20 6:45 PM   Kimberly [name] Oct 21, 1945 [id number]  CC: Gross hematuria  HPI: I saw [name] in urology clinic today for evaluation of gross hematuria for the last week.  She is a comorbid 74 year old female with metastatic colon cancer currently on hospice care.  She is on Eliquis for atrial fibrillation.  1 week ago she developed gross hematuria including clots which prompted a visit to her oncologist this week.  A CT was performed which showed a 5 cm necrotic colon mass that invaded the dome of the bladder as the likely etiology of her gross hematuria.  Urine culture from 7/20 also grew pansensitive E. coli, and she has been started on culture appropriate Cipro.  She stopped her Eliquis yesterday, and she reports her gross hematuria has improved over the last 24 hours, and urine is currently light pink today she reports.  She does not have any problems emptying her bladder, and PVR 0 mill in clinic today.  She has some pelvic pain, but otherwise feels she is doing okay.  Hematocrit on 7/20 was 28.3, on 7/19 was 29.8, dropped significantly from prior baseline hematocrit in April of 38.  PMH: Past Medical History:  Diagnosis Date  . Breast cancer, left ([place]) 2004   Lumpectomy and rad tx's.  . Chronic back pain   . Colon cancer ([place]) 2018  . COPD (chronic obstructive pulmonary disease) ([place])   . Degenerative disc disease, lumbar 04/2013  . Family history of adverse reaction to anesthesia    son arrested after anesthesia  . GERD (gastroesophageal reflux disease)   . Hypertension   . Iron deficiency anemia 05/27/2017  . Personal history of chemotherapy 2018   Colon  . Personal history of radiation therapy    Breast 2004  . Personal history of radiation therapy 2018   Colon  . Postprocedural intraabdominal abscess 09/01/2018  . Small bowel obstruction ([place]) 04/16/2017  . Wears dentures    full upper    Surgical History: Past Surgical History:  Procedure Laterality Date  .  APPENDECTOMY  1965  . BREAST EXCISIONAL BIOPSY Left 08/01/2003   lumpectomy rad 11/04-2/28/2005  . BREAST SURGERY    . CESAREAN SECTION     x3  . COLON SURGERY    . COLONOSCOPY W/ POLYPECTOMY    . COLONOSCOPY WITH PROPOFOL N/A 04/09/2018   Procedure: COLONOSCOPY WITH PROPOFOL;  Surgeon: Virgel Manifold, MD;  Location: [facility];  Service: Gastroenterology;  Laterality: N/A;  . COLONOSCOPY WITH PROPOFOL N/A 08/13/2019   Procedure: COLONOSCOPY WITH PROPOFOL;  Surgeon: Virgel Manifold, MD;  Location: [place];  Service: Endoscopy;  Laterality: N/A;  . COLOSTOMY TAKEDOWN N/A 06/25/2018   Procedure: COLOSTOMY TAKEDOWN;  Surgeon: Jules Husbands, MD;  Location: [facility];  Service: General;  Laterality: N/A;  . ESOPHAGOGASTRODUODENOSCOPY (EGD) WITH PROPOFOL N/A 04/04/2017   Procedure: ESOPHAGOGASTRODUODENOSCOPY (EGD) WITH PROPOFOL;  Surgeon: [name], MD;  Location: [place];  Service: Endoscopy;  Laterality: N/A;  . FRACTURE SURGERY    . HIP FRACTURE SURGERY Left 01/25/2012  . LAPAROSCOPY N/A 12/07/2019   Procedure: LAPAROSCOPY DIAGNOSTIC;  Surgeon: Jules Husbands, MD;  Location: [facility];  Service: General;  Laterality: N/A;  . LAPAROTOMY N/A 04/15/2017   Procedure: EXPLORATORY LAPAROTOMY;  Surgeon: Clayburn Pert, MD;  Location: [facility];  Service: General;  Laterality: N/A;  . NECK SURGERY  12/2011  . PARASTOMAL HERNIA REPAIR N/A 12/03/2017   Procedure: HERNIA REPAIR PARASTOMAL;  Surgeon: [name] Hamman  F, MD;  Location: [facility];  Service: General;  Laterality: N/A;  . PARASTOMAL HERNIA REPAIR N/A 06/25/2018   Procedure: HERNIA REPAIR PARASTOMAL;  Surgeon: Jules Husbands, MD;  Location: [facility];  Service: General;  Laterality: N/A;  . PORTACATH PLACEMENT Right 05/21/2017   Procedure: INSERTION PORT-A-CATH;  Surgeon: Clayburn Pert, MD;  Location: [facility];  Service: General;  Laterality: Right;  . PORTACATH PLACEMENT Right 12/07/2019   Procedure: INSERTION  PORT-A-CATH;  Surgeon: Jules Husbands, MD;  Location: [facility];  Service: General;  Laterality: Right;  . WRIST FRACTURE SURGERY      Family History: Family History  Problem Relation Age of Onset  . Diabetes Mother        type 2  . Coronary artery disease Mother   . Deep vein thrombosis Mother   . Colon cancer Father   . Asthma Brother   . Diabetes Brother        type 2  . Breast cancer Neg Hx     Social History:  reports that she quit smoking about 2 years ago. Her smoking use included cigarettes. She has a 13.75 pack-year smoking history. She has never used smokeless tobacco. She reports current alcohol use. She reports that she does not use drugs.  Physical Exam: BP 115/71   Pulse 72   Ht 5\' 4"  (1.626 m)   Wt 154 lb (69.9 kg)   BMI 26.43 kg/m    Constitutional:  Alert and oriented, No acute distress. Cardiovascular: No clubbing, cyanosis, or edema. Respiratory: Normal respiratory effort, no increased work of breathing. GI: Abdomen is soft, nontender, nondistended, no abdominal masses  Laboratory Data: Reviewed, see HPI  Pertinent Imaging: I have personally reviewed the CT showing a 5 cm necrotic colon mass invading the dome of the bladder  Assessment & Plan:   In summary, she is a complex 75 year old female on hospice care secondary to metastatic colon cancer with new asymptomatic gross hematuria secondary to invasion of her tumor into the dome of the bladder with a 5 cm necrotic mass seen on CT.  She also has a positive urine culture, and recently started culture appropriate antibiotics.  We had a long conversation about her situation and imaging today, and the likely etiology of her gross hematuria being her large colon mass invading the bladder.  We discussed options including continuing hospice care with no intervention aside from holding Eliquis, antibiotics for UTI, and possible ongoing anemia secondary to gross hematuria.  I would defer her decision whether or not  to undergo blood transfusions to her oncology and palliative care team.  Another option would be palliative external beam radiation to the mass to try and improve her hematuria. Finally, another option would be cystoscopy and fulguration in the OR.  Unfortunately, I do not think this would be particularly beneficial to her as the mass appears quite large and necrotic, and any kind of resection or fulguration is unlikely to control any bleeding long-term, and she would be at high risk for developing a colovesical fistula with any kind of resection.  Additionally, the patient is very opposed to any surgical intervention at this time with her hospice care.  We discussed the risks and benefits at length, and she would like to monitor the urine for now with the Eliquis stopped and antibiotics for UTI, and consider radiation in the future if hematuria worsens.  I discussed her case at length with Dr. Tasia Catchings of oncology, Altha Harm with palliative care.  I spent 65 total minutes on the day of the encounter including pre-visit review of the medical record, face-to-face time with the patient, and post visit ordering of labs/imaging/tests.  Nickolas Madrid, MD 04/20/2020  [facility] [address] [place], [place] [address] [phone]

## 2020-04-20 NOTE — Telephone Encounter (Signed)
I called and spoke with patient.  Together, we reviewed the results of her CT scan.  We discussed options for treatment including best supportive care under hospice versus urology eval versus XRT.  Patient would be interested in pursuing urology/XRT.  She recognizes that she would likely have to revoke hospice to pursue treatment but could be referred back to hospice once treatment is completed.  This was also discussed with her hospice nurse and the hospice physician.  Case discussed with Drs. Cristela Blue, and [place]

## 2020-04-21 ENCOUNTER — Inpatient Hospital Stay

## 2020-04-21 ENCOUNTER — Ambulatory Visit

## 2020-04-21 ENCOUNTER — Inpatient Hospital Stay: Admitting: Hospice and Palliative Medicine

## 2020-04-21 ENCOUNTER — Inpatient Hospital Stay: Admitting: Oncology

## 2020-04-23 ENCOUNTER — Encounter: Payer: Self-pay | Admitting: Emergency Medicine

## 2020-04-23 ENCOUNTER — Other Ambulatory Visit: Payer: Self-pay

## 2020-04-23 ENCOUNTER — Emergency Department
Admission: EM | Admit: 2020-04-23 | Discharge: 2020-04-23 | Disposition: A | Discharge status: Home / Self Care | Attending: Emergency Medicine | Admitting: Emergency Medicine

## 2020-04-23 DIAGNOSIS — R110 Nausea: Secondary | ICD-10-CM | POA: Insufficient documentation

## 2020-04-23 DIAGNOSIS — Z5321 Procedure and treatment not carried out due to patient leaving prior to being seen by health care provider: Secondary | ICD-10-CM | POA: Insufficient documentation

## 2020-04-23 DIAGNOSIS — R109 Unspecified abdominal pain: Secondary | ICD-10-CM | POA: Insufficient documentation

## 2020-04-23 LAB — URINALYSIS, COMPLETE (UACMP) WITH MICROSCOPIC: Specific Gravity, Urine: 1.005 (ref 1.005–1.030)

## 2020-04-23 LAB — COMPREHENSIVE METABOLIC PANEL
ALT: 8 U/L (ref 0–44)
AST: 13 U/L — ABNORMAL LOW (ref 15–41)
Albumin: 3.1 g/dL — ABNORMAL LOW (ref 3.5–5.0)
Alkaline Phosphatase: 69 U/L (ref 38–126)
Anion gap: 10 (ref 5–15)
BUN: 11 mg/dL (ref 8–23)
CO2: 27 mmol/L (ref 22–32)
Calcium: 8.5 mg/dL — ABNORMAL LOW (ref 8.9–10.3)
Chloride: 99 mmol/L (ref 98–111)
Creatinine, Ser: 0.74 mg/dL (ref 0.44–1.00)
GFR calc Af Amer: >60 mL/min (ref >60)
GFR calc non Af Amer: >60 mL/min (ref >60)
Glucose, Bld: 101 mg/dL — ABNORMAL HIGH (ref 70–99)
Potassium: 3.9 mmol/L (ref 3.5–5.1)
Sodium: 136 mmol/L (ref 135–145)
Total Bilirubin: 0.3 mg/dL (ref 0.3–1.2)
Total Protein: 6.8 g/dL (ref 6.5–8.1)

## 2020-04-23 LAB — CBC
HCT: 26.2 % — ABNORMAL LOW (ref 36.0–46.0)
Hemoglobin: 8.1 g/dL — ABNORMAL LOW (ref 12.0–15.0)
MCH: 25.9 pg — ABNORMAL LOW (ref 26.0–34.0)
MCHC: 30.9 g/dL (ref 30.0–36.0)
MCV: 83.7 fL (ref 80.0–100.0)
Platelets: 461 10*3/uL — ABNORMAL HIGH (ref 150–400)
RBC: 3.13 MIL/uL — ABNORMAL LOW (ref 3.87–5.11)
RDW: 17.5 % — ABNORMAL HIGH (ref 11.5–15.5)
WBC: 12.6 10*3/uL — ABNORMAL HIGH (ref 4.0–10.5)
nRBC: 0.2 % (ref 0.0–0.2)

## 2020-04-23 LAB — LIPASE, BLOOD: Lipase: 19 U/L (ref 11–51)

## 2020-04-23 NOTE — ED Notes (Signed)
Announced leaving to lobby screening staff

## 2020-04-23 NOTE — ED Triage Notes (Signed)
Pt in via POV, reports left side abdominal pain with radiation around to back since last night.  Pt reports taking Cipro for UTI, unsure if this is a side effect of medication.  Reports some nausea, denies V/D.  Vitals WDL, NAD noted at this time.

## 2020-04-23 NOTE — ED Notes (Signed)
Pt reported to front desk that she was leaving to go see her Hospice RN.

## 2020-04-24 ENCOUNTER — Ambulatory Visit
Admission: RE | Admit: 2020-04-24 | Discharge: 2020-04-24 | Disposition: A | Discharge status: Home / Self Care | Source: Ambulatory Visit | Attending: Radiation Oncology | Admitting: Radiation Oncology

## 2020-04-24 ENCOUNTER — Encounter: Payer: Self-pay | Admitting: Radiation Oncology

## 2020-04-24 ENCOUNTER — Telehealth: Payer: Self-pay | Admitting: Hospice and Palliative Medicine

## 2020-04-24 VITALS — BP 116/62 | HR 92 | Temp 97.3°F | Wt 154.0 lb

## 2020-04-24 DIAGNOSIS — C189 Malignant neoplasm of colon, unspecified: Secondary | ICD-10-CM

## 2020-04-24 MED ORDER — MORPHINE SULFATE ER 15 MG PO TBCR: 15.0000 mg | EXTENDED_RELEASE_TABLET | Freq: Two times a day (BID) | ORAL | 0 refills | Status: DC

## 2020-04-24 MED ORDER — BISACODYL 10 MG RE SUPP: 10.0000 mg | Freq: Every day | RECTAL | 2 refills | Status: DC | PRN

## 2020-04-24 NOTE — Telephone Encounter (Signed)
I spoke with patient and her hospice nurse by phone.  Patient says that she has been having generalized pain and has been using the morphine elixir provided by hospice 5 mg every 2 hours around-the-clock.  She says that this has helped more than the Norco that she was taking.  Will start her on MS Contin 15 mg every 12 hours and attempt to stabilize her pain with use of a long-acting pain medication.  She may continue taking morphine elixir as needed for breakthrough pain.  Patient saw Dr. Donella Stade today and a 2-week course of XRT was recommended.  However, patient reports that her hematuria has stopped.  Patient is speaking with hospice to see if she would need to revoke care to pursue XRT.  She says that if hospice had to stop care then she would likely not pursue XRT.

## 2020-04-24 NOTE — Addendum Note (Signed)
Addended by: Altha Harm R on: 04/24/2020 01:21 PM   Modules accepted: Orders

## 2020-04-24 NOTE — Consult Note (Signed)
NEW PATIENT EVALUATION  Name: Kimberly [name]  MRN: [id number]  Date:   04/24/2020     DOB: 04/01/1946   This 74 y.o. female patient presents to the clinic for initial evaluation of palliative radiation therapy for hematuria in patient with known stage IV colon cancer.  REFERRING PHYSICIAN: Birdie Sons, MD  CHIEF COMPLAINT:  Chief Complaint  Patient presents with  . new consult    DIAGNOSIS: There were no encounter diagnoses.   PREVIOUS INVESTIGATIONS:  CT scans reviewed Pathology reports and previous treatment notes reviewed Clinical notes reviewed  HPI: Patient is a 74 year old female well-known to her apartment out over 2 years having completed radiation therapy to her transverse colon after resection for stage IIIa (T4 N1 M0) adenocarcinoma.  Patient is currently under hospice care for metastatic colorectal cancer status post transverse colon resection back in July 2018.  PET scan back in 2020 showed hypermetabolic 2 small right abdominal mesenteric soft tissue nodules.  CT-guided biopsy was positive for metastatic adenocarcinoma.  She has been on hospice care since April recently had gross hematuria.  Recent CT scan showed central necrotic mass in the right lower quadrant with abnormal tenting of the bladder possible bladder invasion worrisome for local recurrence or metastatic adenopathy.  She has been seen by urology.  She is recent been started on Cipro for E. coli.  She has been on Eliquis which was stopped with improvement in her hematuria.  I have discussed the case both with Dr. Tasia Catchings as well as Dr. Regenia Skeeter of palliative care and she is seen today for consideration of palliative radiation therapy to her pelvis.  Intent of treatment would be to curtail her hematuria.  She is seen today as frail.  She states she is only occasionally seeing some pink tinge to her urine.  PLANNED TREATMENT REGIMEN: Palliative radiation therapy  PAST MEDICAL HISTORY:  has a past medical history of  Breast cancer, left ([place]) (2004), Chronic back pain, Colon cancer ([place]) (2018), COPD (chronic obstructive pulmonary disease) ([place]), Degenerative disc disease, lumbar (04/2013), Family history of adverse reaction to anesthesia, GERD (gastroesophageal reflux disease), Hypertension, Iron deficiency anemia (05/27/2017), Personal history of chemotherapy (2018), Personal history of radiation therapy, Personal history of radiation therapy (2018), Postprocedural intraabdominal abscess (09/01/2018), Small bowel obstruction ([place]) (04/16/2017), and Wears dentures.    PAST SURGICAL HISTORY:  Past Surgical History:  Procedure Laterality Date  . APPENDECTOMY  1965  . BREAST EXCISIONAL BIOPSY Left 08/01/2003   lumpectomy rad 11/04-2/28/2005  . BREAST SURGERY    . CESAREAN SECTION     x3  . COLON SURGERY    . COLONOSCOPY W/ POLYPECTOMY    . COLONOSCOPY WITH PROPOFOL N/A 04/09/2018   Procedure: COLONOSCOPY WITH PROPOFOL;  Surgeon: Virgel Manifold, MD;  Location: [facility];  Service: Gastroenterology;  Laterality: N/A;  . COLONOSCOPY WITH PROPOFOL N/A 08/13/2019   Procedure: COLONOSCOPY WITH PROPOFOL;  Surgeon: Virgel Manifold, MD;  Location: [place];  Service: Endoscopy;  Laterality: N/A;  . COLOSTOMY TAKEDOWN N/A 06/25/2018   Procedure: COLOSTOMY TAKEDOWN;  Surgeon: Jules Husbands, MD;  Location: [facility];  Service: General;  Laterality: N/A;  . ESOPHAGOGASTRODUODENOSCOPY (EGD) WITH PROPOFOL N/A 04/04/2017   Procedure: ESOPHAGOGASTRODUODENOSCOPY (EGD) WITH PROPOFOL;  Surgeon: [name], MD;  Location: [place];  Service: Endoscopy;  Laterality: N/A;  . FRACTURE SURGERY    . HIP FRACTURE SURGERY Left 01/25/2012  . LAPAROSCOPY N/A 12/07/2019   Procedure: LAPAROSCOPY DIAGNOSTIC;  Surgeon: [name] Hamman  F, MD;  Location: [facility];  Service: General;  Laterality: N/A;  . LAPAROTOMY N/A 04/15/2017   Procedure: EXPLORATORY LAPAROTOMY;  Surgeon: Clayburn Pert, MD;  Location: [facility];  Service: General;  Laterality: N/A;  . NECK SURGERY  12/2011  . PARASTOMAL HERNIA REPAIR N/A 12/03/2017   Procedure: HERNIA REPAIR PARASTOMAL;  Surgeon: Jules Husbands, MD;  Location: [facility];  Service: General;  Laterality: N/A;  . PARASTOMAL HERNIA REPAIR N/A 06/25/2018   Procedure: HERNIA REPAIR PARASTOMAL;  Surgeon: Jules Husbands, MD;  Location: [facility];  Service: General;  Laterality: N/A;  . PORTACATH PLACEMENT Right 05/21/2017   Procedure: INSERTION PORT-A-CATH;  Surgeon: Clayburn Pert, MD;  Location: [facility];  Service: General;  Laterality: Right;  . PORTACATH PLACEMENT Right 12/07/2019   Procedure: INSERTION PORT-A-CATH;  Surgeon: Jules Husbands, MD;  Location: [facility];  Service: General;  Laterality: Right;  . WRIST FRACTURE SURGERY      FAMILY HISTORY: family history includes Asthma in her brother; Colon cancer in her father; Coronary artery disease in her mother; Deep vein thrombosis in her mother; Diabetes in her brother and mother.  SOCIAL HISTORY:  reports that she quit smoking about 2 years ago. Her smoking use included cigarettes. She has a 13.75 pack-year smoking history. She has never used smokeless tobacco. She reports current alcohol use. She reports that she does not use drugs.  ALLERGIES: Erbitux [cetuximab], Betadine [povidone iodine], Iodine, Other, and Sinus formula [cholestatin]  MEDICATIONS:  Current Outpatient Medications  Medication Sig Dispense Refill  . apixaban (ELIQUIS) 5 MG TABS tablet Take 1 tablet (5 mg total) by mouth 2 (two) times daily. 60 tablet 3  . budesonide-formoterol (SYMBICORT) 80-4.5 MCG/ACT inhaler Inhale 2 puffs into the lungs 2 (two) times daily. 3 Inhaler 3  . calcium citrate-vitamin D (CITRACAL+D) 315-200 MG-UNIT tablet Take 1 tablet by mouth daily.    . Cholecalciferol (VITAMIN D3) 50 MCG (2000 UT) TABS Take 2,000 Units by mouth daily.    . ciprofloxacin (CIPRO) 500 MG tablet Take 1 tablet (500 mg total) by mouth 2 (two) times  daily. 14 tablet 0  . diphenhydrAMINE (BENADRYL) 25 MG tablet Take 2 tablets (50 mg total) by mouth at bedtime as needed for itching or allergies. 30 tablet 0  . EPINEPHrine 0.3 mg/0.3 mL IJ SOAJ injection Inject 0.3 mLs (0.3 mg total) into the muscle as needed for anaphylaxis. 1 each 0  . ferrous sulfate 325 (65 FE) MG EC tablet Take 1 tablet (325 mg total) by mouth 2 (two) times daily with a meal. 60 tablet 2  . furosemide (LASIX) 20 MG tablet TAKE 1 TABLET BY MOUTH EVERY DAY AS NEEDED 30 tablet 3  . gabapentin (NEURONTIN) 400 MG capsule TAKE 1 CAPSULE THREE TIMES DAILY 270 capsule 1  . HYDROcodone-acetaminophen (NORCO) 7.5-325 MG tablet Take 1-2 tablets by mouth every 4 (four) hours as needed for moderate pain. 120 tablet 0  . LORazepam (ATIVAN) 0.5 MG tablet Take 0.5 mg by mouth every 4 (four) hours as needed.    . magnesium chloride (SLOW-MAG) 64 MG TBEC SR tablet Take 1 tablet (64 mg total) by mouth daily. 7 tablet 0  . metoprolol succinate (TOPROL-XL) 100 MG 24 hr tablet TAKE 1 TABLET (100 MG TOTAL) BY MOUTH DAILY. TAKE WITH OR IMMEDIATELY FOLLOWING A MEAL. 90 tablet 4  . Multiple Vitamins-Minerals (WOMENS MULTI VITAMIN & MINERAL PO) Take 2 tablets by mouth daily.     Marland Kitchen omeprazole (PRILOSEC) 20 MG  capsule Take 1 capsule (20 mg total) by mouth daily. 90 capsule 4  . ondansetron (ZOFRAN) 8 MG tablet Take by mouth.    . pravastatin (PRAVACHOL) 40 MG tablet TAKE 1 TABLET EVERY DAY 90 tablet 4  . Probiotic Product (PROBIOTIC DAILY PO) Take 1 capsule by mouth daily.     Marland Kitchen senna-docusate (SENOKOT-S) 8.6-50 MG tablet Take 2 tablets by mouth daily. 60 tablet 1   No current facility-administered medications for this encounter.    ECOG PERFORMANCE STATUS:  1 - Symptomatic but completely ambulatory  REVIEW OF SYSTEMS: Patient denies any weight loss, fatigue, weakness, fever, chills or night sweats. Patient denies any loss of vision, blurred vision. Patient denies any ringing  of the ears or hearing  loss. No irregular heartbeat. Patient denies heart murmur or history of fainting. Patient denies any chest pain or pain radiating to her upper extremities. Patient denies any shortness of breath, difficulty breathing at night, cough or hemoptysis. Patient denies any swelling in the lower legs. Patient denies any nausea vomiting, vomiting of blood, or coffee ground material in the vomitus. Patient denies any stomach pain. Patient states has had normal bowel movements no significant constipation or diarrhea. Patient denies any dysuria, hematuria or significant nocturia. Patient denies any problems walking, swelling in the joints or loss of balance. Patient denies any skin changes, loss of hair or loss of weight. Patient denies any excessive worrying or anxiety or significant depression. Patient denies any problems with insomnia. Patient denies excessive thirst, polyuria, polydipsia. Patient denies any swollen glands, patient denies easy bruising or easy bleeding. Patient denies any recent infections, allergies or URI. Patient "s visual fields have not changed significantly in recent time.   PHYSICAL EXAM: BP (!) 116/62 (BP Location: Left Arm, Patient Position: Sitting, Cuff Size: Normal)   Pulse 92   Temp (!) 97.3 F (36.3 C) (Tympanic)   Wt 154 lb (69.9 kg)   BMI 26.43 kg/m  Well-developed well-nourished patient in NAD. HEENT reveals PERLA, EOMI, discs not visualized.  Oral cavity is clear. No oral mucosal lesions are identified. Neck is clear without evidence of cervical or supraclavicular adenopathy. Lungs are clear to A&P. Cardiac examination is essentially unremarkable with regular rate and rhythm without murmur rub or thrill. Abdomen is benign with no organomegaly or masses noted. Motor sensory and DTR levels are equal and symmetric in the upper and lower extremities. Cranial nerves II through XII are grossly intact. Proprioception is intact. No peripheral adenopathy or edema is identified. No motor or  sensory levels are noted. Crude visual fields are within normal range.  LABORATORY DATA: Prior pathology reports and labs are reviewed    RADIOLOGY RESULTS: CT scan reviewed compatible with above-stated findings   IMPRESSION: Progressive metastatic adenocarcinoma of the transverse colon now with probable invasion of bladder and hematuria in 74 year old female  PLAN: At this time I reviewed her prior radiation fields.  This area was not in her prior radiation fields.  I like to do a hypofractionated course of radiation therapy to her pelvis with plan on delivering 2000 cGy in 10 fractions encompassing the area of tumor by CT criteria.  I will reevaluate after completion of treatment for any possible further therapy although I think that would be sufficient dose to curtail any further hematuria.  Patient comprehends my recommendations well if personally set up and ordered CT simulation for later this week.  I would like to take this opportunity to thank you for allowing me to participate  in the care of your patient.Noreene Filbert, MD

## 2020-04-26 ENCOUNTER — Ambulatory Visit

## 2020-04-26 ENCOUNTER — Telehealth: Payer: Self-pay | Admitting: Hospice and Palliative Medicine

## 2020-04-26 NOTE — Telephone Encounter (Signed)
Spoke with patient's hospice nurse.  She reports pain is improved on MS Contin.  However, patient is now constipated with last bowel movement 4 to 5 days ago.  Discussed increasing laxatives with senna and MiraLAX.  Okay to use lactulose 15 to 30 mL daily if needed.  Also recommended that the nurse check for impaction.  Note that reportedly patient has decided to stay with hospice and forego XRT at the current time.

## 2020-04-27 ENCOUNTER — Inpatient Hospital Stay
Admission: EM | Admit: 2020-04-27 | Discharge: 2020-05-04 | DRG: 871 | Disposition: A | Discharge status: Hospice | Payer: Medicare Other | Attending: Internal Medicine | Admitting: Internal Medicine

## 2020-04-27 ENCOUNTER — Other Ambulatory Visit: Payer: Self-pay

## 2020-04-27 ENCOUNTER — Emergency Department: Payer: Medicare Other

## 2020-04-27 DIAGNOSIS — J9621 Acute and chronic respiratory failure with hypoxia: Secondary | ICD-10-CM | POA: Diagnosis not present

## 2020-04-27 DIAGNOSIS — F419 Anxiety disorder, unspecified: Secondary | ICD-10-CM | POA: Diagnosis present

## 2020-04-27 DIAGNOSIS — K56609 Unspecified intestinal obstruction, unspecified as to partial versus complete obstruction: Secondary | ICD-10-CM

## 2020-04-27 DIAGNOSIS — Z7901 Long term (current) use of anticoagulants: Secondary | ICD-10-CM

## 2020-04-27 DIAGNOSIS — D630 Anemia in neoplastic disease: Secondary | ICD-10-CM | POA: Diagnosis present

## 2020-04-27 DIAGNOSIS — I10 Essential (primary) hypertension: Secondary | ICD-10-CM | POA: Diagnosis present

## 2020-04-27 DIAGNOSIS — M549 Dorsalgia, unspecified: Secondary | ICD-10-CM | POA: Diagnosis present

## 2020-04-27 DIAGNOSIS — I480 Paroxysmal atrial fibrillation: Secondary | ICD-10-CM | POA: Diagnosis present

## 2020-04-27 DIAGNOSIS — E872 Acidosis: Secondary | ICD-10-CM | POA: Diagnosis present

## 2020-04-27 DIAGNOSIS — Z87891 Personal history of nicotine dependence: Secondary | ICD-10-CM

## 2020-04-27 DIAGNOSIS — R54 Age-related physical debility: Secondary | ICD-10-CM | POA: Diagnosis present

## 2020-04-27 DIAGNOSIS — Z7951 Long term (current) use of inhaled steroids: Secondary | ICD-10-CM

## 2020-04-27 DIAGNOSIS — R0689 Other abnormalities of breathing: Secondary | ICD-10-CM | POA: Diagnosis not present

## 2020-04-27 DIAGNOSIS — K219 Gastro-esophageal reflux disease without esophagitis: Secondary | ICD-10-CM | POA: Diagnosis present

## 2020-04-27 DIAGNOSIS — I4891 Unspecified atrial fibrillation: Secondary | ICD-10-CM

## 2020-04-27 DIAGNOSIS — Z8249 Family history of ischemic heart disease and other diseases of the circulatory system: Secondary | ICD-10-CM

## 2020-04-27 DIAGNOSIS — Z800 Family history of malignant neoplasm of digestive organs: Secondary | ICD-10-CM

## 2020-04-27 DIAGNOSIS — J189 Pneumonia, unspecified organism: Secondary | ICD-10-CM | POA: Diagnosis present

## 2020-04-27 DIAGNOSIS — R42 Dizziness and giddiness: Secondary | ICD-10-CM | POA: Diagnosis not present

## 2020-04-27 DIAGNOSIS — I499 Cardiac arrhythmia, unspecified: Secondary | ICD-10-CM | POA: Diagnosis not present

## 2020-04-27 DIAGNOSIS — J986 Disorders of diaphragm: Secondary | ICD-10-CM | POA: Diagnosis not present

## 2020-04-27 DIAGNOSIS — G8929 Other chronic pain: Secondary | ICD-10-CM | POA: Diagnosis present

## 2020-04-27 DIAGNOSIS — Z888 Allergy status to other drugs, medicaments and biological substances status: Secondary | ICD-10-CM

## 2020-04-27 DIAGNOSIS — Z515 Encounter for palliative care: Secondary | ICD-10-CM | POA: Diagnosis not present

## 2020-04-27 DIAGNOSIS — Z833 Family history of diabetes mellitus: Secondary | ICD-10-CM

## 2020-04-27 DIAGNOSIS — Z9221 Personal history of antineoplastic chemotherapy: Secondary | ICD-10-CM

## 2020-04-27 DIAGNOSIS — Z853 Personal history of malignant neoplasm of breast: Secondary | ICD-10-CM

## 2020-04-27 DIAGNOSIS — R319 Hematuria, unspecified: Secondary | ICD-10-CM | POA: Diagnosis present

## 2020-04-27 DIAGNOSIS — E876 Hypokalemia: Secondary | ICD-10-CM | POA: Diagnosis present

## 2020-04-27 DIAGNOSIS — J449 Chronic obstructive pulmonary disease, unspecified: Secondary | ICD-10-CM | POA: Diagnosis present

## 2020-04-27 DIAGNOSIS — C800 Disseminated malignant neoplasm, unspecified: Secondary | ICD-10-CM | POA: Diagnosis present

## 2020-04-27 DIAGNOSIS — E785 Hyperlipidemia, unspecified: Secondary | ICD-10-CM | POA: Diagnosis present

## 2020-04-27 DIAGNOSIS — Z79899 Other long term (current) drug therapy: Secondary | ICD-10-CM

## 2020-04-27 DIAGNOSIS — J440 Chronic obstructive pulmonary disease with acute lower respiratory infection: Secondary | ICD-10-CM | POA: Diagnosis present

## 2020-04-27 DIAGNOSIS — R0902 Hypoxemia: Secondary | ICD-10-CM | POA: Diagnosis not present

## 2020-04-27 DIAGNOSIS — E871 Hypo-osmolality and hyponatremia: Secondary | ICD-10-CM | POA: Diagnosis present

## 2020-04-27 DIAGNOSIS — R230 Cyanosis: Secondary | ICD-10-CM | POA: Diagnosis not present

## 2020-04-27 DIAGNOSIS — Z66 Do not resuscitate: Secondary | ICD-10-CM | POA: Diagnosis present

## 2020-04-27 DIAGNOSIS — C184 Malignant neoplasm of transverse colon: Secondary | ICD-10-CM | POA: Diagnosis present

## 2020-04-27 DIAGNOSIS — C189 Malignant neoplasm of colon, unspecified: Secondary | ICD-10-CM | POA: Diagnosis present

## 2020-04-27 DIAGNOSIS — J9611 Chronic respiratory failure with hypoxia: Secondary | ICD-10-CM | POA: Diagnosis present

## 2020-04-27 DIAGNOSIS — Z923 Personal history of irradiation: Secondary | ICD-10-CM

## 2020-04-27 DIAGNOSIS — Z825 Family history of asthma and other chronic lower respiratory diseases: Secondary | ICD-10-CM

## 2020-04-27 DIAGNOSIS — A419 Sepsis, unspecified organism: Principal | ICD-10-CM | POA: Diagnosis present

## 2020-04-27 DIAGNOSIS — Z86010 Personal history of colonic polyps: Secondary | ICD-10-CM

## 2020-04-27 DIAGNOSIS — Z20822 Contact with and (suspected) exposure to covid-19: Secondary | ICD-10-CM | POA: Diagnosis present

## 2020-04-27 DIAGNOSIS — M810 Age-related osteoporosis without current pathological fracture: Secondary | ICD-10-CM | POA: Diagnosis present

## 2020-04-27 DIAGNOSIS — R6520 Severe sepsis without septic shock: Secondary | ICD-10-CM | POA: Diagnosis present

## 2020-04-27 DIAGNOSIS — I4892 Unspecified atrial flutter: Secondary | ICD-10-CM | POA: Diagnosis present

## 2020-04-27 DIAGNOSIS — K6389 Other specified diseases of intestine: Secondary | ICD-10-CM | POA: Diagnosis not present

## 2020-04-27 LAB — CBC WITH DIFFERENTIAL/PLATELET
Abs Immature Granulocytes: 0.1 10*3/uL — ABNORMAL HIGH (ref 0.00–0.07)
Basophils Absolute: 0.1 10*3/uL (ref 0.0–0.1)
Basophils Relative: 1 %
Eosinophils Absolute: 0 10*3/uL (ref 0.0–0.5)
Eosinophils Relative: 0 %
HCT: 27.3 % — ABNORMAL LOW (ref 36.0–46.0)
Hemoglobin: 8.6 g/dL — ABNORMAL LOW (ref 12.0–15.0)
Immature Granulocytes: 1 %
Lymphocytes Relative: 7 %
Lymphs Abs: 1 10*3/uL (ref 0.7–4.0)
MCH: 25.6 pg — ABNORMAL LOW (ref 26.0–34.0)
MCHC: 31.5 g/dL (ref 30.0–36.0)
MCV: 81.3 fL (ref 80.0–100.0)
Monocytes Absolute: 1.9 10*3/uL — ABNORMAL HIGH (ref 0.1–1.0)
Monocytes Relative: 13 %
Neutro Abs: 11.3 10*3/uL — ABNORMAL HIGH (ref 1.7–7.7)
Neutrophils Relative %: 78 %
Platelets: 571 10*3/uL — ABNORMAL HIGH (ref 150–400)
RBC: 3.36 MIL/uL — ABNORMAL LOW (ref 3.87–5.11)
RDW: 17.3 % — ABNORMAL HIGH (ref 11.5–15.5)
WBC: 14.4 10*3/uL — ABNORMAL HIGH (ref 4.0–10.5)
nRBC: 0.4 % — ABNORMAL HIGH (ref 0.0–0.2)

## 2020-04-27 MED ORDER — DILTIAZEM HCL-DEXTROSE 125-5 MG/125ML-% IV SOLN (PREMIX)
5.0000 mg/h | INTRAVENOUS | Status: DC
  Administered 2020-04-27: 15 mg/h via INTRAVENOUS

## 2020-04-27 MED ORDER — SODIUM CHLORIDE 0.9 % IV BOLUS
500.0000 mL | Freq: Once | INTRAVENOUS | Status: AC
  Administered 2020-04-27: 500 mL via INTRAVENOUS

## 2020-04-27 MED ORDER — DILTIAZEM HCL 25 MG/5ML IV SOLN
15.0000 mg | Freq: Once | INTRAVENOUS | Status: AC
  Administered 2020-04-27: 15 mg via INTRAVENOUS
  Filled 2020-04-27: qty 5

## 2020-04-27 MED ORDER — ONDANSETRON HCL 4 MG/2ML IJ SOLN
4.0000 mg | Freq: Once | INTRAMUSCULAR | Status: AC
  Administered 2020-04-27: 4 mg via INTRAVENOUS
  Filled 2020-04-27: qty 2

## 2020-04-27 MED ORDER — ONDANSETRON HCL 4 MG/2ML IJ SOLN: 4.0000 mg | Freq: Once | INTRAMUSCULAR | Status: DC

## 2020-04-27 NOTE — ED Triage Notes (Signed)
Pt arrives to ED from home via [facility] with c/c of nausea, afib RVR. EMS reports transport vitals of 122/77, O2 sat 100% non rebreather, pulse initially 180, now 148, CBG 160. Upon arrival, pt A&Ox4, NAD, Dr Alfred Levins at pt bedside.

## 2020-04-28 ENCOUNTER — Emergency Department: Payer: Medicare Other

## 2020-04-28 ENCOUNTER — Ambulatory Visit

## 2020-04-28 DIAGNOSIS — Z515 Encounter for palliative care: Secondary | ICD-10-CM | POA: Insufficient documentation

## 2020-04-28 DIAGNOSIS — C189 Malignant neoplasm of colon, unspecified: Secondary | ICD-10-CM | POA: Diagnosis not present

## 2020-04-28 DIAGNOSIS — Z833 Family history of diabetes mellitus: Secondary | ICD-10-CM | POA: Diagnosis not present

## 2020-04-28 DIAGNOSIS — K56609 Unspecified intestinal obstruction, unspecified as to partial versus complete obstruction: Secondary | ICD-10-CM

## 2020-04-28 DIAGNOSIS — K6389 Other specified diseases of intestine: Secondary | ICD-10-CM | POA: Diagnosis not present

## 2020-04-28 DIAGNOSIS — I10 Essential (primary) hypertension: Secondary | ICD-10-CM | POA: Diagnosis not present

## 2020-04-28 DIAGNOSIS — Z66 Do not resuscitate: Secondary | ICD-10-CM | POA: Diagnosis not present

## 2020-04-28 DIAGNOSIS — Z7951 Long term (current) use of inhaled steroids: Secondary | ICD-10-CM | POA: Diagnosis not present

## 2020-04-28 DIAGNOSIS — Z20822 Contact with and (suspected) exposure to covid-19: Secondary | ICD-10-CM | POA: Diagnosis not present

## 2020-04-28 DIAGNOSIS — Z825 Family history of asthma and other chronic lower respiratory diseases: Secondary | ICD-10-CM | POA: Diagnosis not present

## 2020-04-28 DIAGNOSIS — Z7901 Long term (current) use of anticoagulants: Secondary | ICD-10-CM | POA: Diagnosis not present

## 2020-04-28 DIAGNOSIS — J449 Chronic obstructive pulmonary disease, unspecified: Secondary | ICD-10-CM | POA: Diagnosis not present

## 2020-04-28 DIAGNOSIS — J9611 Chronic respiratory failure with hypoxia: Secondary | ICD-10-CM | POA: Diagnosis present

## 2020-04-28 DIAGNOSIS — E872 Acidosis: Secondary | ICD-10-CM | POA: Diagnosis present

## 2020-04-28 DIAGNOSIS — F17200 Nicotine dependence, unspecified, uncomplicated: Secondary | ICD-10-CM | POA: Diagnosis not present

## 2020-04-28 DIAGNOSIS — J90 Pleural effusion, not elsewhere classified: Secondary | ICD-10-CM | POA: Diagnosis not present

## 2020-04-28 DIAGNOSIS — Z87891 Personal history of nicotine dependence: Secondary | ICD-10-CM | POA: Diagnosis not present

## 2020-04-28 DIAGNOSIS — Z888 Allergy status to other drugs, medicaments and biological substances status: Secondary | ICD-10-CM | POA: Diagnosis not present

## 2020-04-28 DIAGNOSIS — D630 Anemia in neoplastic disease: Secondary | ICD-10-CM | POA: Diagnosis present

## 2020-04-28 DIAGNOSIS — A419 Sepsis, unspecified organism: Secondary | ICD-10-CM | POA: Diagnosis not present

## 2020-04-28 DIAGNOSIS — K3189 Other diseases of stomach and duodenum: Secondary | ICD-10-CM | POA: Diagnosis not present

## 2020-04-28 DIAGNOSIS — R6520 Severe sepsis without septic shock: Secondary | ICD-10-CM | POA: Diagnosis not present

## 2020-04-28 DIAGNOSIS — J9621 Acute and chronic respiratory failure with hypoxia: Secondary | ICD-10-CM | POA: Diagnosis not present

## 2020-04-28 DIAGNOSIS — K5939 Other megacolon: Secondary | ICD-10-CM | POA: Diagnosis not present

## 2020-04-28 DIAGNOSIS — I4892 Unspecified atrial flutter: Secondary | ICD-10-CM | POA: Diagnosis present

## 2020-04-28 DIAGNOSIS — Z7401 Bed confinement status: Secondary | ICD-10-CM | POA: Diagnosis not present

## 2020-04-28 DIAGNOSIS — I480 Paroxysmal atrial fibrillation: Secondary | ICD-10-CM | POA: Diagnosis present

## 2020-04-28 DIAGNOSIS — Z800 Family history of malignant neoplasm of digestive organs: Secondary | ICD-10-CM | POA: Diagnosis not present

## 2020-04-28 DIAGNOSIS — E871 Hypo-osmolality and hyponatremia: Secondary | ICD-10-CM | POA: Diagnosis present

## 2020-04-28 DIAGNOSIS — M2550 Pain in unspecified joint: Secondary | ICD-10-CM | POA: Diagnosis not present

## 2020-04-28 DIAGNOSIS — R0902 Hypoxemia: Secondary | ICD-10-CM | POA: Diagnosis not present

## 2020-04-28 DIAGNOSIS — J189 Pneumonia, unspecified organism: Secondary | ICD-10-CM | POA: Diagnosis not present

## 2020-04-28 DIAGNOSIS — J439 Emphysema, unspecified: Secondary | ICD-10-CM | POA: Diagnosis not present

## 2020-04-28 DIAGNOSIS — R531 Weakness: Secondary | ICD-10-CM | POA: Diagnosis not present

## 2020-04-28 DIAGNOSIS — Z8249 Family history of ischemic heart disease and other diseases of the circulatory system: Secondary | ICD-10-CM | POA: Diagnosis not present

## 2020-04-28 DIAGNOSIS — C800 Disseminated malignant neoplasm, unspecified: Secondary | ICD-10-CM | POA: Diagnosis not present

## 2020-04-28 DIAGNOSIS — J440 Chronic obstructive pulmonary disease with acute lower respiratory infection: Secondary | ICD-10-CM | POA: Diagnosis not present

## 2020-04-28 DIAGNOSIS — I482 Chronic atrial fibrillation, unspecified: Secondary | ICD-10-CM | POA: Diagnosis not present

## 2020-04-28 DIAGNOSIS — C184 Malignant neoplasm of transverse colon: Secondary | ICD-10-CM | POA: Diagnosis present

## 2020-04-28 DIAGNOSIS — I4891 Unspecified atrial fibrillation: Secondary | ICD-10-CM | POA: Diagnosis present

## 2020-04-28 DIAGNOSIS — Z79899 Other long term (current) drug therapy: Secondary | ICD-10-CM | POA: Diagnosis not present

## 2020-04-28 LAB — COMPREHENSIVE METABOLIC PANEL
ALT: 8 U/L (ref 0–44)
AST: 12 U/L — ABNORMAL LOW (ref 15–41)
Albumin: 3 g/dL — ABNORMAL LOW (ref 3.5–5.0)
Alkaline Phosphatase: 66 U/L (ref 38–126)
Anion gap: 12 (ref 5–15)
BUN: 21 mg/dL (ref 8–23)
CO2: 26 mmol/L (ref 22–32)
Calcium: 8.1 mg/dL — ABNORMAL LOW (ref 8.9–10.3)
Chloride: 96 mmol/L — ABNORMAL LOW (ref 98–111)
Creatinine, Ser: 0.81 mg/dL (ref 0.44–1.00)
GFR calc Af Amer: >60 mL/min (ref >60)
GFR calc non Af Amer: >60 mL/min (ref >60)
Glucose, Bld: 118 mg/dL — ABNORMAL HIGH (ref 70–99)
Potassium: 3.5 mmol/L (ref 3.5–5.1)
Sodium: 134 mmol/L — ABNORMAL LOW (ref 135–145)
Total Bilirubin: 0.8 mg/dL (ref 0.3–1.2)
Total Protein: 6.5 g/dL (ref 6.5–8.1)

## 2020-04-28 LAB — CBC
HCT: 27.7 % — ABNORMAL LOW (ref 36.0–46.0)
HCT: 28.5 % — ABNORMAL LOW (ref 36.0–46.0)
Hemoglobin: 8.2 g/dL — ABNORMAL LOW (ref 12.0–15.0)
Hemoglobin: 8.5 g/dL — ABNORMAL LOW (ref 12.0–15.0)
MCH: 24.7 pg — ABNORMAL LOW (ref 26.0–34.0)
MCH: 24.7 pg — ABNORMAL LOW (ref 26.0–34.0)
MCHC: 29.6 g/dL — ABNORMAL LOW (ref 30.0–36.0)
MCHC: 29.8 g/dL — ABNORMAL LOW (ref 30.0–36.0)
MCV: 83.4 fL (ref 80.0–100.0)
MCV: 82.8 fL (ref 80.0–100.0)
Platelets: 548 10*3/uL — ABNORMAL HIGH (ref 150–400)
Platelets: 527 10*3/uL — ABNORMAL HIGH (ref 150–400)
RBC: 3.32 MIL/uL — ABNORMAL LOW (ref 3.87–5.11)
RBC: 3.44 MIL/uL — ABNORMAL LOW (ref 3.87–5.11)
RDW: 17.2 % — ABNORMAL HIGH (ref 11.5–15.5)
RDW: 17.2 % — ABNORMAL HIGH (ref 11.5–15.5)
WBC: 13.9 10*3/uL — ABNORMAL HIGH (ref 4.0–10.5)
WBC: 15.3 10*3/uL — ABNORMAL HIGH (ref 4.0–10.5)
nRBC: 0.3 % — ABNORMAL HIGH (ref 0.0–0.2)
nRBC: 0.3 % — ABNORMAL HIGH (ref 0.0–0.2)

## 2020-04-28 LAB — SARS CORONAVIRUS 2 BY RT PCR (HOSPITAL ORDER, PERFORMED IN ~~LOC~~ HOSPITAL LAB): SARS Coronavirus 2: NEGATIVE

## 2020-04-28 LAB — BASIC METABOLIC PANEL
Anion gap: 11 (ref 5–15)
BUN: 20 mg/dL (ref 8–23)
CO2: 26 mmol/L (ref 22–32)
Calcium: 7.8 mg/dL — ABNORMAL LOW (ref 8.9–10.3)
Chloride: 97 mmol/L — ABNORMAL LOW (ref 98–111)
Creatinine, Ser: 0.66 mg/dL (ref 0.44–1.00)
GFR calc Af Amer: >60 mL/min (ref >60)
GFR calc non Af Amer: >60 mL/min (ref >60)
Glucose, Bld: 111 mg/dL — ABNORMAL HIGH (ref 70–99)
Potassium: 3.7 mmol/L (ref 3.5–5.1)
Sodium: 134 mmol/L — ABNORMAL LOW (ref 135–145)

## 2020-04-28 MED ORDER — ONDANSETRON HCL 4 MG/2ML IJ SOLN
4.0000 mg | Freq: Four times a day (QID) | INTRAMUSCULAR | Status: DC | PRN
  Administered 2020-04-28 – 2020-05-02 (×3): 4 mg via INTRAVENOUS
  Filled 2020-04-28 (×3): qty 2

## 2020-04-28 MED ORDER — ONDANSETRON HCL 4 MG PO TABS: 4.0000 mg | ORAL_TABLET | Freq: Four times a day (QID) | ORAL | Status: DC | PRN

## 2020-04-28 MED ORDER — MOMETASONE FURO-FORMOTEROL FUM 100-5 MCG/ACT IN AERO
2.0000 | INHALATION_SPRAY | Freq: Two times a day (BID) | RESPIRATORY_TRACT | Status: DC
  Administered 2020-04-28 – 2020-05-04 (×12): 2 via RESPIRATORY_TRACT
  Filled 2020-04-28 (×2): qty 8.8

## 2020-04-28 MED ORDER — MORPHINE SULFATE (PF) 2 MG/ML IV SOLN
1.0000 mg | INTRAVENOUS | Status: DC | PRN
  Administered 2020-04-28 – 2020-04-30 (×11): 1 mg via INTRAVENOUS
  Filled 2020-04-28 (×11): qty 1

## 2020-04-28 MED ORDER — FLUTICASONE PROPIONATE 50 MCG/ACT NA SUSP
2.0000 | Freq: Every day | NASAL | Status: DC
  Administered 2020-04-28 – 2020-05-02 (×2): 2 via NASAL
  Filled 2020-04-28: qty 16

## 2020-04-28 MED ORDER — HYDROMORPHONE HCL 1 MG/ML IJ SOLN
1.0000 mg | Freq: Once | INTRAMUSCULAR | Status: AC
  Administered 2020-04-28: 1 mg via INTRAVENOUS
  Filled 2020-04-28: qty 1

## 2020-04-28 MED ORDER — SODIUM CHLORIDE 0.9% FLUSH
3.0000 mL | Freq: Two times a day (BID) | INTRAVENOUS | Status: DC
  Administered 2020-04-28 – 2020-05-04 (×7): 3 mL via INTRAVENOUS

## 2020-04-28 MED ORDER — ALBUTEROL SULFATE (2.5 MG/3ML) 0.083% IN NEBU: 2.5000 mg | INHALATION_SOLUTION | RESPIRATORY_TRACT | Status: DC | PRN

## 2020-04-28 MED ORDER — MAGNESIUM SULFATE 2 GM/50ML IV SOLN
2.0000 g | Freq: Once | INTRAVENOUS | Status: AC
  Administered 2020-04-28: 2 g via INTRAVENOUS
  Filled 2020-04-28: qty 50

## 2020-04-28 MED ORDER — FUROSEMIDE 10 MG/ML IJ SOLN
40.0000 mg | INTRAMUSCULAR | Status: AC
  Administered 2020-04-28: 40 mg via INTRAVENOUS
  Filled 2020-04-28: qty 4

## 2020-04-28 MED ORDER — MORPHINE SULFATE (PF) 2 MG/ML IV SOLN
2.0000 mg | INTRAVENOUS | Status: DC | PRN
  Administered 2020-04-28 (×2): 2 mg via INTRAVENOUS
  Filled 2020-04-28 (×2): qty 1

## 2020-04-28 MED ORDER — LORAZEPAM 2 MG/ML IJ SOLN
0.5000 mg | INTRAMUSCULAR | Status: DC | PRN
  Administered 2020-04-28 – 2020-04-30 (×6): 0.5 mg via INTRAVENOUS
  Filled 2020-04-28 (×7): qty 1

## 2020-04-28 MED ORDER — CHLORHEXIDINE GLUCONATE CLOTH 2 % EX PADS
6.0000 | MEDICATED_PAD | Freq: Every day | CUTANEOUS | Status: DC
  Administered 2020-04-28 – 2020-05-03 (×6): 6 via TOPICAL

## 2020-04-28 MED ORDER — SODIUM CHLORIDE 0.9 % IV SOLN: INTRAVENOUS | Status: DC

## 2020-04-28 MED ORDER — DILTIAZEM HCL-DEXTROSE 125-5 MG/125ML-% IV SOLN (PREMIX)
5.0000 mg/h | INTRAVENOUS | Status: DC
  Administered 2020-04-28 – 2020-05-01 (×9): 15 mg/h via INTRAVENOUS
  Administered 2020-05-02: 10 mg/h via INTRAVENOUS
  Administered 2020-05-02: 15 mg/h via INTRAVENOUS
  Administered 2020-05-02: 5 mg/h via INTRAVENOUS
  Administered 2020-05-02: 15 mg/h via INTRAVENOUS
  Administered 2020-05-02: 10 mg/h via INTRAVENOUS
  Administered 2020-05-02: 15 mg/h via INTRAVENOUS
  Administered 2020-05-02: 12.5 mg/h via INTRAVENOUS
  Administered 2020-05-03 (×2): 15 mg/h via INTRAVENOUS
  Filled 2020-04-28 (×15): qty 125

## 2020-04-28 NOTE — Progress Notes (Signed)
Cross Cover Brief Note Notified of concerns regarding patient condition and previous transfer order to ICU/SDU.  Nurse concerned patient with repeated episodes of increased heart rate and decreased oxygen saturations on the 13L HFNC.  Noted patient with recent dose lasix as probable fluid overload and she is having good urine output, however is maroon in color./ Currently she states she feels OK. Sats stable and heart rate in 90's, atrial fib, with cardizem drip infusing. She reports nasal passage clogged in right and NGT still in left. She has taken intranasal corticosteroids in past for nasal stuffiness High flow nasal cannula attempted for more comfort but patient did not tolerate.  Patient will try venti mask and if does not work either with comfort level. Plan for NRB or return to HFNC if ventimask ineffective. Also discussed with patient and daughter her comfort/pain level. Will add prn ativan for anxiety

## 2020-04-28 NOTE — Progress Notes (Addendum)
When patient arrived to floor, she was 88% on 10L HFNC and in significant pain. Oxygen increased to 13L HFNC and morphine 1mg  IV given. NGT also not allowing good oxygen flow into nose and respiratory called and said they recommend leaving her on the 13LNC for now seeing she is staying over 92%. Patient still in significant pain after the 1mg  morphine so a once time dose of 1mg  Dilaudid given. One hour afterward patient states she is "slightly better" and two hours afterward she is rating her pain a "4" but still grimacing occasionally. Currently, she is drifting off and on to sleep and vitals are stable. Earlier she was also complaining of severe lower abdominal pain and foley was inserted yielding 300cc's dark maroon colored urine.  Based on the amount of time and attention this patient has needed to get to a slightly more comfortable state I recommend more intensive nursing than we can provide on PCU.

## 2020-04-28 NOTE — Consult Note (Signed)
[place]  Telephone:([phone] [phone]   Name: Kimberly [name] Date: 04/28/2020 MRN: [id number]  DOB: 1946-05-03  Patient Care Team: Birdie Sons, MD as PCP - General (Family Medicine) Earlie Server, MD as Consulting Physician (Oncology) Noreene Filbert, MD as Referring Physician (Radiation Oncology) Jules Husbands, MD as Consulting Physician (General Surgery) Corey Skains, MD as Consulting Physician (Cardiology)    REASON FOR CONSULTATION: Kimberly [name] is a 74 y.o. female with multiple medical problems including metastatic colon cancer status post resection in 2018 with colostomy/reversal in 05/2018 and status post adjuvant FOLFOX.  PET scan on 09/19/2019 revealed recurrent disease.  Patient underwent CT-guided biopsy 08/20/2019 one of her abdominal wall mass revealing metastatic adenocarcinoma of the colon.  Patient was hospitalized 12/08/2019-12/10/2019 with A. fib/RVR and was started on anticoagulation with Eliquis.  On 12/24/2019 patient had anaphylactic reaction to cetuximab requiring intubation.  Patient has decided to forego further treatment.  She was referred to hospice.  Patient developed hematuria in July 2021.  CT of abdomen/pelvis on 04/19/2020 revealed disease progression with new necrotic mass invading the bladder, increased nodularity in the right anterior abdominal wall, and peripheral enhancing lesion in the anterior uterine fundus suspicious for metastatic disease.  Patient's Eliquis was discontinued.  She was seen by urology and radiation oncology.  Patient ultimately revoked hospice to pursue XRT.  She subsequently was admitted to the hospital on 04/28/2020 with nausea and vomiting and found to have a partial SBO.  Palliative care was consulted help address goals.  SOCIAL HISTORY:     reports that she quit smoking about 2 years ago. Her smoking use included cigarettes. She has a 13.75 pack-year smoking history. She  has never used smokeless tobacco. She reports current alcohol use. She reports that she does not use drugs.  Patient is divorced.  She lives at home alone.  She has a daughter who lives nearby and a son in [place].  Patient previously worked in [employer].  ADVANCE DIRECTIVES:  Patient's daughter is her [facility] POA  CODE STATUS: DNR/DNI (MOST form completed on 01/28/2020)  PAST MEDICAL HISTORY: Past Medical History:  Diagnosis Date  . Breast cancer, left ([place]) 2004   Lumpectomy and rad tx's.  . Chronic back pain   . Colon cancer ([place]) 2018  . COPD (chronic obstructive pulmonary disease) ([place])   . Degenerative disc disease, lumbar 04/2013  . Family history of adverse reaction to anesthesia    son arrested after anesthesia  . GERD (gastroesophageal reflux disease)   . Hypertension   . Iron deficiency anemia 05/27/2017  . Personal history of chemotherapy 2018   Colon  . Personal history of radiation therapy    Breast 2004  . Personal history of radiation therapy 2018   Colon  . Postprocedural intraabdominal abscess 09/01/2018  . Small bowel obstruction ([place]) 04/16/2017  . Wears dentures    full upper    PAST SURGICAL HISTORY:  Past Surgical History:  Procedure Laterality Date  . APPENDECTOMY  1965  . BREAST EXCISIONAL BIOPSY Left 08/01/2003   lumpectomy rad 11/04-2/28/2005  . BREAST SURGERY    . CESAREAN SECTION     x3  . COLON SURGERY    . COLONOSCOPY W/ POLYPECTOMY    . COLONOSCOPY WITH PROPOFOL N/A 04/09/2018   Procedure: COLONOSCOPY WITH PROPOFOL;  Surgeon: Virgel Manifold, MD;  Location: [facility];  Service: Gastroenterology;  Laterality: N/A;  . COLONOSCOPY WITH PROPOFOL N/A 08/13/2019  Procedure: COLONOSCOPY WITH PROPOFOL;  Surgeon: Virgel Manifold, MD;  Location: [place];  Service: Endoscopy;  Laterality: N/A;  . COLOSTOMY TAKEDOWN N/A 06/25/2018   Procedure: COLOSTOMY TAKEDOWN;  Surgeon: Jules Husbands, MD;  Location: [facility];  Service:  General;  Laterality: N/A;  . ESOPHAGOGASTRODUODENOSCOPY (EGD) WITH PROPOFOL N/A 04/04/2017   Procedure: ESOPHAGOGASTRODUODENOSCOPY (EGD) WITH PROPOFOL;  Surgeon: [name], MD;  Location: [place];  Service: Endoscopy;  Laterality: N/A;  . FRACTURE SURGERY    . HIP FRACTURE SURGERY Left 01/25/2012  . LAPAROSCOPY N/A 12/07/2019   Procedure: LAPAROSCOPY DIAGNOSTIC;  Surgeon: Jules Husbands, MD;  Location: [facility];  Service: General;  Laterality: N/A;  . LAPAROTOMY N/A 04/15/2017   Procedure: EXPLORATORY LAPAROTOMY;  Surgeon: Clayburn Pert, MD;  Location: [facility];  Service: General;  Laterality: N/A;  . NECK SURGERY  12/2011  . PARASTOMAL HERNIA REPAIR N/A 12/03/2017   Procedure: HERNIA REPAIR PARASTOMAL;  Surgeon: Jules Husbands, MD;  Location: [facility];  Service: General;  Laterality: N/A;  . PARASTOMAL HERNIA REPAIR N/A 06/25/2018   Procedure: HERNIA REPAIR PARASTOMAL;  Surgeon: Jules Husbands, MD;  Location: [facility];  Service: General;  Laterality: N/A;  . PORTACATH PLACEMENT Right 05/21/2017   Procedure: INSERTION PORT-A-CATH;  Surgeon: Clayburn Pert, MD;  Location: [facility];  Service: General;  Laterality: Right;  . PORTACATH PLACEMENT Right 12/07/2019   Procedure: INSERTION PORT-A-CATH;  Surgeon: Jules Husbands, MD;  Location: [facility];  Service: General;  Laterality: Right;  . WRIST FRACTURE SURGERY      HEMATOLOGY/ONCOLOGY HISTORY:  Oncology History  Malignant neoplasm of colon ([place])  04/26/2017 Initial Diagnosis   Malignant neoplasm of transverse colon ([place])   11/06/2019 - 11/06/2019 Chemotherapy   The patient had palonosetron (ALOXI) injection 0.25 mg, 0.25 mg, Intravenous,  Once, 0 of 4 cycles irinotecan (CAMPTOSAR) 320 mg in sodium chloride 0.9 % 500 mL chemo infusion, 180 mg/m2, Intravenous,  Once, 0 of 4 cycles fluorouracil (ADRUCIL) chemo injection 750 mg, 400 mg/m2, Intravenous,  Once, 0 of 4 cycles fluorouracil (ADRUCIL) 4,350 mg in sodium chloride 0.9 % 63 mL  chemo infusion, 2,400 mg/m2, Intravenous, 1 Day/Dose, 0 of 4 cycles bevacizumab-bvzr (ZIRABEV) 400 mg in sodium chloride 0.9 % 100 mL chemo infusion, 5 mg/kg, Intravenous,  Once, 0 of 4 cycles leucovorin 728 mg in sodium chloride 0.9 % 250 mL infusion, 400 mg/m2, Intravenous,  Once, 0 of 4 cycles  for chemotherapy treatment.    12/24/2019 - 12/24/2019 Chemotherapy   The patient had cetuximab (ERBITUX) chemo infusion 700 mg, 400 mg/m2 = 700 mg, Intravenous,  Once, 1 of 4 cycles Administration: 700 mg (12/24/2019)  for chemotherapy treatment.    01/07/2020 -  Chemotherapy   The patient had panitumumab (VECTIBIX) 400 mg in sodium chloride 0.9 % 100 mL chemo infusion, 6 mg/kg = 400 mg, Intravenous,  Once, 1 of 1 cycle Administration: 400 mg (01/07/2020) panitumumab (VECTIBIX) 400 mg in sodium chloride 0.9 % 100 mL chemo infusion, 6 mg/kg, Intravenous,  Once, 0 of 3 cycles  for chemotherapy treatment.      ALLERGIES:  is allergic to erbitux [cetuximab], betadine [povidone iodine], iodine, other, and sinus formula [cholestatin].  MEDICATIONS:  Current Facility-Administered Medications  Medication Dose Route Frequency Provider Last Rate Last Admin  . 0.9 %  sodium chloride infusion   Intravenous Continuous Opyd, Ilene Qua, MD 75 mL/hr at 04/28/20 0628 New Bag at 04/28/20 9323  . albuterol (PROVENTIL) (2.5 MG/3ML) 0.083%  nebulizer solution 2.5 mg  2.5 mg Inhalation Q4H PRN Opyd, Ilene Qua, MD      . diltiazem (CARDIZEM) 125 mg in dextrose 5% 125 mL (1 mg/mL) infusion  5-15 mg/hr Intravenous Continuous Opyd, Ilene Qua, MD 15 mL/hr at 04/28/20 0714 15 mg/hr at 04/28/20 0714  . mometasone-formoterol (DULERA) 100-5 MCG/ACT inhaler 2 puff  2 puff Inhalation BID Opyd, Ilene Qua, MD   2 puff at 04/28/20 1030  . morphine 2 MG/ML injection 2-4 mg  2-4 mg Intravenous Q4H PRN Opyd, Ilene Qua, MD   2 mg at 04/28/20 1200  . ondansetron (ZOFRAN) tablet 4 mg  4 mg Oral Q6H PRN Opyd, Ilene Qua, MD       Or  .  ondansetron (ZOFRAN) injection 4 mg  4 mg Intravenous Q6H PRN Opyd, Ilene Qua, MD   4 mg at 04/28/20 9024  . sodium chloride flush (NS) 0.9 % injection 3 mL  3 mL Intravenous Q12H Opyd, Ilene Qua, MD       Current Outpatient Medications  Medication Sig Dispense Refill  . apixaban (ELIQUIS) 5 MG TABS tablet Take 1 tablet (5 mg total) by mouth 2 (two) times daily. 60 tablet 3  . budesonide-formoterol (SYMBICORT) 80-4.5 MCG/ACT inhaler Inhale 2 puffs into the lungs 2 (two) times daily. 3 Inhaler 3  . calcium citrate-vitamin D (CITRACAL+D) 315-200 MG-UNIT tablet Take 1 tablet by mouth daily.    . Cholecalciferol (VITAMIN D3) 50 MCG (2000 UT) TABS Take 2,000 Units by mouth daily.    . ciprofloxacin (CIPRO) 500 MG tablet Take 1 tablet (500 mg total) by mouth 2 (two) times daily. 14 tablet 0  . ferrous sulfate 325 (65 FE) MG EC tablet Take 1 tablet (325 mg total) by mouth 2 (two) times daily with a meal. 60 tablet 2  . furosemide (LASIX) 20 MG tablet TAKE 1 TABLET BY MOUTH EVERY DAY AS NEEDED 30 tablet 3  . gabapentin (NEURONTIN) 400 MG capsule TAKE 1 CAPSULE THREE TIMES DAILY 270 capsule 1  . HYDROcodone-acetaminophen (NORCO) 7.5-325 MG tablet Take 1-2 tablets by mouth every 4 (four) hours as needed for moderate pain. 120 tablet 0  . LORazepam (ATIVAN) 0.5 MG tablet Take 0.5 mg by mouth every 4 (four) hours as needed.    . magnesium chloride (SLOW-MAG) 64 MG TBEC SR tablet Take 1 tablet (64 mg total) by mouth daily. 7 tablet 0  . metoprolol succinate (TOPROL-XL) 100 MG 24 hr tablet TAKE 1 TABLET (100 MG TOTAL) BY MOUTH DAILY. TAKE WITH OR IMMEDIATELY FOLLOWING A MEAL. 90 tablet 4  . morphine (MS CONTIN) 15 MG 12 hr tablet Take 1 tablet (15 mg total) by mouth every 12 (twelve) hours. 30 tablet 0  . Multiple Vitamins-Minerals (WOMENS MULTI VITAMIN & MINERAL PO) Take 2 tablets by mouth daily.     Marland Kitchen omeprazole (PRILOSEC) 20 MG capsule Take 1 capsule (20 mg total) by mouth daily. 90 capsule 4  . ondansetron  (ZOFRAN) 8 MG tablet Take 8 mg by mouth every 8 (eight) hours as needed.     . pravastatin (PRAVACHOL) 40 MG tablet TAKE 1 TABLET EVERY DAY 90 tablet 4  . Probiotic Product (PROBIOTIC DAILY PO) Take 1 capsule by mouth daily.     Marland Kitchen senna-docusate (SENOKOT-S) 8.6-50 MG tablet Take 2 tablets by mouth daily. 60 tablet 1  . bisacodyl (DULCOLAX) 10 MG suppository Place 1 suppository (10 mg total) rectally daily as needed for moderate constipation. 30 suppository 2  . diphenhydrAMINE (  BENADRYL) 25 MG tablet Take 2 tablets (50 mg total) by mouth at bedtime as needed for itching or allergies. 30 tablet 0  . EPINEPHrine 0.3 mg/0.3 mL IJ SOAJ injection Inject 0.3 mLs (0.3 mg total) into the muscle as needed for anaphylaxis. 1 each 0    VITAL SIGNS: BP (!) 130/61   Pulse 103   Resp 14   Ht 5\' 4"  (1.626 m)   Wt 154 lb (69.9 kg)   SpO2 90%   BMI 26.43 kg/m  Filed Weights   04/27/20 2356  Weight: 154 lb (69.9 kg)    Estimated body mass index is 26.43 kg/m as calculated from the following:   Height as of this encounter: 5\' 4"  (1.626 m).   Weight as of this encounter: 154 lb (69.9 kg).  LABS: CBC:    Component Value Date/Time   WBC 13.9 (H) 04/28/2020 0622   HGB 8.2 (L) 04/28/2020 0622   HGB 12.2 01/02/2017 1410   HCT 27.7 (L) 04/28/2020 0622   HCT 37.3 01/02/2017 1410   PLT 548 (H) 04/28/2020 0622   PLT 486 (H) 01/02/2017 1410   MCV 83.4 04/28/2020 0622   MCV 86 01/02/2017 1410   MCV 96 01/29/2012 0428   NEUTROABS 11.3 (H) 04/27/2020 2335   NEUTROABS 7.8 (H) 01/02/2017 1410   NEUTROABS 9.2 (H) 01/29/2012 0428   LYMPHSABS 1.0 04/27/2020 2335   LYMPHSABS 3.9 (H) 01/02/2017 1410   LYMPHSABS 1.9 01/29/2012 0428   MONOABS 1.9 (H) 04/27/2020 2335   MONOABS 2.0 (H) 01/29/2012 0428   EOSABS 0.0 04/27/2020 2335   EOSABS 0.4 01/02/2017 1410   EOSABS 0.1 01/29/2012 0428   BASOSABS 0.1 04/27/2020 2335   BASOSABS 0.1 01/02/2017 1410   BASOSABS 0.1 01/29/2012 0428   Comprehensive Metabolic  Panel:    Component Value Date/Time   NA 134 (L) 04/28/2020 0622   NA 141 01/02/2017 1410   NA 136 01/27/2012 0637   K 3.7 04/28/2020 0622   K 4.3 01/27/2012 0637   CL 97 (L) 04/28/2020 0622   CL 102 01/27/2012 0637   CO2 26 04/28/2020 0622   CO2 25 01/27/2012 0637   BUN 20 04/28/2020 0622   BUN 14 01/02/2017 1410   BUN 15 01/27/2012 0637   CREATININE 0.66 04/28/2020 0622   CREATININE 0.67 01/27/2012 0637   GLUCOSE 111 (H) 04/28/2020 0622   GLUCOSE 119 (H) 01/27/2012 0637   CALCIUM 7.8 (L) 04/28/2020 0622   CALCIUM 7.7 (L) 01/27/2012 0637   AST 12 (L) 04/27/2020 2335   AST 17 01/26/2012 0021   ALT 8 04/27/2020 2335   ALT 19 01/26/2012 0021   ALKPHOS 66 04/27/2020 2335   ALKPHOS 87 01/26/2012 0021   BILITOT 0.8 04/27/2020 2335   BILITOT <0.2 01/02/2017 1410   BILITOT 0.2 01/26/2012 0021   PROT 6.5 04/27/2020 2335   PROT 7.0 01/02/2017 1410   PROT 7.6 01/26/2012 0021   ALBUMIN 3.0 (L) 04/27/2020 2335   ALBUMIN 3.9 01/02/2017 1410   ALBUMIN 3.5 01/26/2012 0021    RADIOGRAPHIC STUDIES: CT Abdomen Pelvis W Wo Contrast  Result Date: 04/19/2020 CLINICAL DATA:  Painless hematuria for 5 days. History of colon cancer with colostomy and reversal. EXAM: CT ABDOMEN AND PELVIS WITHOUT AND WITH CONTRAST TECHNIQUE: Multidetector CT imaging of the abdomen and pelvis was performed following the standard protocol before and following the bolus administration of intravenous contrast. CONTRAST:  176mL OMNIPAQUE IOHEXOL 300 MG/ML  SOLN COMPARISON:  Abdominopelvic CT 12/08/2019. FINDINGS: Lower chest:  Stable linear scarring in the right lower lobe and mild chronic central airway thickening at both lung bases. Hepatobiliary: The liver is normal in density without suspicious focal abnormality. No evidence of gallstones, gallbladder wall thickening or biliary dilatation. Pancreas: Unremarkable. No pancreatic ductal dilatation or surrounding inflammatory changes. Spleen: Normal in size without focal  abnormality. Adrenals/Urinary Tract: There are stable low-density adrenal masses consistent with adenomas. These measure up to 2.7 cm on the right and 5.3 cm on the left. Pre-contrast images demonstrate no renal, ureteral or bladder calculi. Post-contrast, both kidneys enhance normally. There is no evidence of enhancing renal mass. Delayed images result in segmental visualization of the ureters. No focal upper tract urothelial abnormalities are identified. New abnormal tenting of the bladder superiorly on the right with an adjacent ill-defined low-density mass with peripheral enhancement measuring up to 4.8 x 3.5 cm on image 61/5. This is inferior to the ileocolonic anastomosis and is worrisome for local recurrence of colon cancer with central necrosis. This mass may invade the bladder. There is mild bladder wall thickening without other focal bladder abnormality. Stomach/Bowel: Stable mild diffuse gastric wall thickening. No small bowel distension or surrounding inflammation. Status post right hemicolectomy with ileocolonic anastomosis. There is an additional anastomosis in the distal transverse colon. No evidence of bowel obstruction. Vascular/Lymphatic: As above, new centrally necrotic mass in the right lower quadrant worrisome for local recurrence of colon cancer or metastatic adenopathy. There are no enlarged retroperitoneal lymph nodes. Diffuse aortic and branch vessel atherosclerosis. The portal, superior mesenteric and splenic veins are patent. Reproductive: Peripherally enhancing lesion along the anterior uterine fundus measures up to 2.9 x 2.1 cm on image 59/11. This could reflect a uterine fibroid, although is not seen on previous studies, and could reflect a metastasis given the other findings. No adnexal mass. Other: In addition to the right lower quadrant centrally necrotic lesion described above, there is a small spiculated lesion posterior to the ileocolonic anastomosis measuring 12 mm on image 50/5.  There is increased nodularity in the right anterior abdominal wall measuring 3.2 x 2.3 cm on image 39/5. No ascites or generalized peritoneal nodularity. Musculoskeletal: No acute or significant osseous findings. Previous left hip ORIF. IMPRESSION: 1. New centrally necrotic mass in the right lower quadrant with abnormal tenting of the bladder and possible bladder invasion, worrisome for local recurrence of colon cancer or metastatic adenopathy. At least one other mildly enlarged lymph node is noted. 2. Increased nodularity in the right anterior abdominal wall, suspicious for metastatic disease. 3. Peripherally enhancing lesion in the anterior uterine fundus suspicious for metastatic disease given nonvisualization on previous imaging. 4. No evidence of urinary tract calculus, renal mass or hydronephrosis. 5. Stable bilateral adrenal adenomas. 6. Aortic Atherosclerosis (ICD10-I70.0). 7. These results will be called to the ordering clinician or representative by the Radiologist Assistant, and communication documented in the PACS or [employer]. Electronically Signed   By: Richardean Sale M.D.   On: 04/19/2020 16:16   CT ABDOMEN PELVIS WO CONTRAST  Result Date: 04/28/2020 CLINICAL DATA:  Nonlocalized abdominal pain with nausea EXAM: CT ABDOMEN AND PELVIS WITHOUT CONTRAST TECHNIQUE: Multidetector CT imaging of the abdomen and pelvis was performed following the standard protocol without IV contrast. COMPARISON:  None. FINDINGS: Lower chest: The visualized heart size within normal limits. No pericardial fluid/thickening. No hiatal hernia. Patchy airspace opacities seen at the posterior left lung base. Hepatobiliary: Although limited due to the lack of intravenous contrast, normal in appearance without gross focal abnormality. No evidence  of calcified gallstones or biliary ductal dilatation. Pancreas:  Unremarkable.  No surrounding inflammatory changes. Spleen: Normal in size. Although limited due to the lack of  intravenous contrast, normal in appearance. Adrenals/Urinary Tract: Both adrenal glands appear normal. The kidneys and collecting system appear normal without evidence of urinary tract calculus or hydronephrosis. Bladder is unremarkable. Stomach/Bowel: There is a mildly dilated fluid and food stuff filled stomach. Dilated air and fluid-filled loops small bowel are seen throughout the mid abdomen measuring up to 4.5 cm down to the level of the distal ileum within the right lower quadrant where there is a gradual area of narrowing which is not well seen. The distal ileal loops and terminal ileum are decompressed. There is been a prior partial right colectomy with surgical sutures noted. A moderate amount of colonic stool is seen throughout. Scattered colonic diverticula are seen. Vascular/Lymphatic: There are no enlarged abdominal or pelvic lymph nodes. Scattered aortic atherosclerotic calcifications are seen without aneurysmal dilatation. Reproductive: The deep pelvis is unremarkable. Other: No evidence of abdominal wall mass or hernia. Musculoskeletal: No acute or significant osseous findings. IMPRESSION: Findings consistent with a partial small bowel obstruction down to the level of the right lower abdomen where there is a gradual area of narrowing within the distal ileal loops. No definite focal transition point however is noted. Aortic Atherosclerosis (ICD10-I70.0). Electronically Signed   By: Prudencio Pair M.D.   On: 04/28/2020 01:08   DG Abdomen Acute W/Chest  Result Date: 04/27/2020 CLINICAL DATA:  74 year old female with abdominal pain, nausea, atrial fibrillation with RVR. EXAM: DG ABDOMEN ACUTE W/ 1V CHEST COMPARISON:  CT Abdomen and Pelvis 04/19/2020 and earlier. FINDINGS: Portable AP upright view at 2336 hours. Increased elevation of the left hemidiaphragm from the CT last week. Normal cardiac size and mediastinal contours. Visualized tracheal air column is within normal limits. Increased pulmonary  interstitial markings appear stable. Right chest Port-A-Cath which is accessed. No pneumothorax. No definite pleural effusion. Upright and supine views of the abdomen and pelvis. Increased gastric and small bowel air since the prior CT, including dilated 4 cm left lower quadrant small bowel loops. Previous partial colectomy, but little to no gas is identified in the residual large bowel. Superimposed mesenteric masses demonstrated by CT. No pneumoperitoneum identified. Stable visualized osseous structures. IMPRESSION: 1. Appearance suspicious for new small-bowel obstruction since the CT 04/19/2020, perhaps secondary to the mesenteric mass seen at that time. 2. No pneumoperitoneum identified. 3. New elevation of the left hemidiaphragm probably related to #1. No definite pleural fluid. Electronically Signed   By: Genevie Ann M.D.   On: 04/27/2020 23:56    PERFORMANCE STATUS (ECOG) : 2 - Symptomatic, <50% confined to bed  Review of Systems Unless otherwise noted, a complete review of systems is negative.  Physical Exam General: NAD, frail appearing HEENT: NGT in place Pulmonary: Unlabored Abdomen: Tender, nondistended GU: no suprapubic tenderness Extremities: no edema, no joint deformities Skin: no rashes Neurological: Weakness but otherwise nonfocal  IMPRESSION: Patient is well-known to me from the clinic.  She was seen today in the ER.  Patient reports feeling slightly better after gastric decompression.  She has not had recent vomiting.  NGT is in place.  Discussed overall goals.  Patient revoked hospice yesterday to pursue XRT for recurrent hematuria due to progressive colorectal cancer.  Patient is currently scheduled for CT simulation on 8/3.  Patient says that she hopes that she will be able to pursue treatment. Patient also hopes that she will be  able to reenroll in hospice care at the earliest opportunity.    Patient says her goals are aligned with the current scope of treatment.  She  would not be interested in surgical intervention nor do I think patient would be a surgical candidate.  Would recommend continued conservative management of the SBO.  It has been patient's goal to remain home with hospice following.  However, residential hospice could be considered in the event of decline or should patient fail to improve.  PLAN: -Recommend best supportive care with conservative management of SBO -DNR/DNI -We will follow   Time Total: 60 minutes  Visit consisted of counseling and education dealing with the complex and emotionally intense issues of symptom management and palliative care in the setting of serious and potentially life-threatening illness.Greater than 50%  of this time was spent counseling and coordinating care related to the above assessment and plan.  Signed by: Altha Harm, PhD, NP-C

## 2020-04-28 NOTE — Progress Notes (Addendum)
Nursing reported worsening respi status requiring 13 liters HFNC to maintain her oxygenation.  Also placed foley and requiring significant amount of pain meds IV  I had long d/w patient's daughter twice to discuss goals of care. She also discussed with patient.  They want to continue aggressive medical care - NGT, pain meds, HFNC, BiPAP. Will give 40 mg IV lasix STAT.  Patient/family wouldn't want intubation/CPR. - agreeable with DNR.  Per my d/w floor RN - they're unable to handle this on floor and will need ICU/SD level care.  I've requested transfer to ICU/SD.

## 2020-04-28 NOTE — Progress Notes (Addendum)
Same day rounding progress note  Patient seen and examined while in the ED.  Waiting for the floor bed.  She revoked her hospice 2 days ago and would like to be treated but also is reasonable if she does not improve then would like to go back under hospice and be at peace/comfort care.  1. Atrial fibrillation with RVR  - Patient has hx of AF, recently taken off of Eliquis due to hematuria from tumor invasion of urinary bladder, now presenting with abdominal pain and N/V and found to be in a fib with rate in 150s -her heart rate is improving and is in low 100s now - BP has been stable and patient asymptomatic  - She was given IV diltiazem bolus x2 in ED  - Continue diltiazem infusion, cardiac monitoring, may not be able to convert to oral therapy until SBO resolved    2. SBO  - Presents with abdominal pain and N/V and is found to have SBO  - Bowel rest, NGT decompression, pain-control, close monitoring   - Surgery seen -> conservative mgmt. Appreciate input.  3. Colon cancer  - Patient has adenocarcinoma of the colon with metastases, reports that she did not tolerate chemo and has elected for supportive care through hospice which she has revoked 2 days ago.  I have consulted palliative care  4. COPD; chronic hypoxic respiratory failure  - Patient denies change in chronic cough or dyspnea  - Continue supplemental O2, inhalers   5. Anemia - Hgb 8.6->8.2, appears stable  - Monitor    6. SIRS  - Patient has leukocytosis that appears to be chronic, tachycardia in setting of AF RVR, and no apparent infectious process  - Monitor, culture if febrile   Time spent - 20 minutes  I expect her to be in the hospital for at least 2 to 3 days if not longer, depending on her clinical course and SBO resolution

## 2020-04-28 NOTE — Progress Notes (Signed)
Pt coughed a few times expressing moderate amount of sputum and her heart rate went up to 150 and it took a minutes for it to come back down into the 110's. Pt did not experience any symptoms during heart rate spike. Dr. Manuella Ghazi notified.

## 2020-04-28 NOTE — Consult Note (Addendum)
[facility] SURGICAL CONSULTATION NOTE (initial) - cpt: 74259  HISTORY OF PRESENT ILLNESS (HPI):  74 y.o. female, well known to our service secondary to metastatic colon cancer, who presented to [facility] ED today for evaluation of abdominal pain. Patient reports around a 5 day history of generalized abdominal discomfort and decreased bowel function. Abdominal pain is somewhat chronic given her history of metastatic adenocarcinoma of the colon however she has reported no bowel movement or flatus in the last 5 days which is concerning. She tried some lactulose for this without relief and had subsequent nausea and emesis. No fever, chills. She has been following up with palliative care outpatient for this issue as well. Most recently, she had been experiencing hematuria and had seen Dr Diamantina Providence with urology and recommended stopping Eliquis, starting Abx, and discussing palliative radiation. She saw Dr Donella Stade on 07/26 in consultation, but she is uncertain is she will pursue this. She is uncertain if she continues to have hematuria or not. Work up in the ED was primarily concerning for small bowel obstruction most likely secondary to metastatic colon cancer with carcinomatosis.   Surgery is consulted by hospitalist physician Dr. Max Sane, MD in this context for evaluation and management of small bowel obstruction in the setting of metastatic colon cancer with biopsy confirmed carcinomatosis.  PAST MEDICAL HISTORY (PMH):  Past Medical History:  Diagnosis Date   Breast cancer, left ([place]) 2004   Lumpectomy and rad tx's.   Chronic back pain    Colon cancer ([place]) 2018   COPD (chronic obstructive pulmonary disease) (HCC)    Degenerative disc disease, lumbar 04/2013   Family history of adverse reaction to anesthesia    son arrested after anesthesia   GERD (gastroesophageal reflux disease)    Hypertension    Iron deficiency anemia 05/27/2017   Personal history of chemotherapy 2018   Colon    Personal history of radiation therapy    Breast 2004   Personal history of radiation therapy 2018   Colon   Postprocedural intraabdominal abscess 09/01/2018   Small bowel obstruction ([place]) 04/16/2017   Wears dentures    full upper     PAST SURGICAL HISTORY ([place]):  Past Surgical History:  Procedure Laterality Date   APPENDECTOMY  1965   BREAST EXCISIONAL BIOPSY Left 08/01/2003   lumpectomy rad 11/04-2/28/2005   BREAST SURGERY     CESAREAN SECTION     x3   COLON SURGERY     COLONOSCOPY W/ POLYPECTOMY     COLONOSCOPY WITH PROPOFOL N/A 04/09/2018   Procedure: COLONOSCOPY WITH PROPOFOL;  Surgeon: Virgel Manifold, MD;  Location: [facility];  Service: Gastroenterology;  Laterality: N/A;   COLONOSCOPY WITH PROPOFOL N/A 08/13/2019   Procedure: COLONOSCOPY WITH PROPOFOL;  Surgeon: Virgel Manifold, MD;  Location: [place];  Service: Endoscopy;  Laterality: N/A;   COLOSTOMY TAKEDOWN N/A 06/25/2018   Procedure: COLOSTOMY TAKEDOWN;  Surgeon: Jules Husbands, MD;  Location: [facility];  Service: General;  Laterality: N/A;   ESOPHAGOGASTRODUODENOSCOPY (EGD) WITH PROPOFOL N/A 04/04/2017   Procedure: ESOPHAGOGASTRODUODENOSCOPY (EGD) WITH PROPOFOL;  Surgeon: [name], MD;  Location: [place];  Service: Endoscopy;  Laterality: N/A;   FRACTURE SURGERY     HIP FRACTURE SURGERY Left 01/25/2012   LAPAROSCOPY N/A 12/07/2019   Procedure: LAPAROSCOPY DIAGNOSTIC;  Surgeon: Jules Husbands, MD;  Location: [facility];  Service: General;  Laterality: N/A;   LAPAROTOMY N/A 04/15/2017   Procedure: EXPLORATORY LAPAROTOMY;  Surgeon: Clayburn Pert, MD;  Location: [facility];  Service: General;  Laterality: N/A;   NECK SURGERY  12/2011   PARASTOMAL HERNIA REPAIR N/A 12/03/2017   Procedure: HERNIA REPAIR PARASTOMAL;  Surgeon: Jules Husbands, MD;  Location: [facility];  Service: General;  Laterality: N/A;   PARASTOMAL HERNIA REPAIR N/A 06/25/2018   Procedure: HERNIA REPAIR PARASTOMAL;  Surgeon:  Jules Husbands, MD;  Location: [facility];  Service: General;  Laterality: N/A;   PORTACATH PLACEMENT Right 05/21/2017   Procedure: INSERTION PORT-A-CATH;  Surgeon: Clayburn Pert, MD;  Location: [facility];  Service: General;  Laterality: Right;   PORTACATH PLACEMENT Right 12/07/2019   Procedure: INSERTION PORT-A-CATH;  Surgeon: Jules Husbands, MD;  Location: [facility];  Service: General;  Laterality: Right;   WRIST FRACTURE SURGERY       MEDICATIONS:  Prior to Admission medications   Medication Sig Start Date End Date Taking? Authorizing Provider  apixaban (ELIQUIS) 5 MG TABS tablet Take 1 tablet (5 mg total) by mouth 2 (two) times daily. 01/18/20  Yes Birdie Sons, MD  budesonide-formoterol [facility]) 80-4.5 MCG/ACT inhaler Inhale 2 puffs into the lungs 2 (two) times daily. 12/25/19  Yes Lavina Hamman, MD  calcium citrate-vitamin D (CITRACAL+D) 315-200 MG-UNIT tablet Take 1 tablet by mouth daily.   Yes [provider]  Cholecalciferol (VITAMIN D3) 50 MCG (2000 UT) TABS Take 2,000 Units by mouth daily. 11/05/19  Yes Birdie Sons, MD  ciprofloxacin (CIPRO) 500 MG tablet Take 1 tablet (500 mg total) by mouth 2 (two) times daily. 04/18/20  Yes Earlie Server, MD  ferrous sulfate 325 (65 FE) MG EC tablet Take 1 tablet (325 mg total) by mouth 2 (two) times daily with a meal. 04/18/20  Yes Earlie Server, MD  furosemide (LASIX) 20 MG tablet TAKE 1 TABLET BY MOUTH EVERY DAY AS NEEDED 03/04/20  Yes Earlie Server, MD  gabapentin (NEURONTIN) 400 MG capsule TAKE 1 CAPSULE THREE TIMES DAILY 11/08/19  Yes Earlie Server, MD  HYDROcodone-acetaminophen (NORCO) 7.5-325 MG tablet Take 1-2 tablets by mouth every 4 (four) hours as needed for moderate pain. 04/11/20  Yes Borders, Kirt Boys, NP  LORazepam (ATIVAN) 0.5 MG tablet Take 0.5 mg by mouth every 4 (four) hours as needed. 04/11/20  Yes [provider]  magnesium chloride (SLOW-MAG) 64 MG TBEC SR tablet Take 1 tablet (64 mg total) by mouth daily. 01/22/20  Yes Earlie Server,  MD  metoprolol succinate (TOPROL-XL) 100 MG 24 hr tablet TAKE 1 TABLET (100 MG TOTAL) BY MOUTH DAILY. TAKE WITH OR IMMEDIATELY FOLLOWING A MEAL. 03/28/20  Yes Birdie Sons, MD  morphine (MS CONTIN) 15 MG 12 hr tablet Take 1 tablet (15 mg total) by mouth every 12 (twelve) hours. 04/24/20  Yes Borders, Kirt Boys, NP  Multiple Vitamins-Minerals (WOMENS MULTI VITAMIN & MINERAL PO) Take 2 tablets by mouth daily.    Yes [provider]  omeprazole (PRILOSEC) 20 MG capsule Take 1 capsule (20 mg total) by mouth daily. 03/10/19  Yes Birdie Sons, MD  ondansetron (ZOFRAN) 8 MG tablet Take 8 mg by mouth every 8 (eight) hours as needed.  01/28/20  Yes [provider]  pravastatin (PRAVACHOL) 40 MG tablet TAKE 1 TABLET EVERY DAY 05/20/19  Yes Birdie Sons, MD  Probiotic Product (PROBIOTIC DAILY PO) Take 1 capsule by mouth daily.    Yes [provider]  senna-docusate (SENOKOT-S) 8.6-50 MG tablet Take 2 tablets by mouth daily. 12/24/19  Yes Earlie Server, MD  bisacodyl (DULCOLAX)  10 MG suppository Place 1 suppository (10 mg total) rectally daily as needed for moderate constipation. 04/24/20   Borders, Kirt Boys, NP  diphenhydrAMINE (BENADRYL) 25 MG tablet Take 2 tablets (50 mg total) by mouth at bedtime as needed for itching or allergies. 12/25/19   Lavina Hamman, MD  EPINEPHrine 0.3 mg/0.3 mL IJ SOAJ injection Inject 0.3 mLs (0.3 mg total) into the muscle as needed for anaphylaxis. 12/25/19   Lavina Hamman, MD     ALLERGIES:  Allergies  Allergen Reactions   Erbitux [Cetuximab] Anaphylaxis   Betadine [Povidone Iodine] Other (See Comments)    Skin Irritation    Iodine Other (See Comments)    Skin Irritation    Other Nausea And Vomiting    Antihystamines   Sinus Formula [Cholestatin] Nausea Only     SOCIAL HISTORY:  Social History   Socioeconomic History   Marital status: Divorced    Spouse name: Not on file   Number of children: 3   Years of education: Not on file    Highest education level: 7th grade  Occupational History   Occupation: retired  Tobacco Use   Smoking status: Former Smoker    Packs/day: 0.25    Years: 55.00    Pack years: 13.75    Types: Cigarettes    Quit date: 05/21/2017    Years since quitting: 2.9   Smokeless tobacco: Never Used   Tobacco comment: started age 25 1/3 to 1 ppd  Vaping Use   Vaping Use: Former  Substance and Sexual Activity   Alcohol use: Yes    Alcohol/week: 0.0 standard drinks    Comment: occasionally drinks beer / 1 month   Drug use: No   Sexual activity: Never  Other Topics Concern   Not on file  Social History Narrative   Lives at home alone   Social Determinants of Health   Financial Resource Strain: Low Risk    Difficulty of Paying Living Expenses: Not hard at all  Food Insecurity: No Food Insecurity   Worried About Charity fundraiser in the Last Year: Never true   [place] in the Last Year: Never true  Transportation Needs: No Transportation Needs   Lack of Transportation (Medical): No   Lack of Transportation (Non-Medical): No  Physical Activity: Inactive   Days of Exercise per Week: 0 days   Minutes of Exercise per Session: 0 min  Stress: No Stress Concern Present   Feeling of Stress : Not at all  Social Connections: Socially Isolated   Frequency of Communication with Friends and Family: More than three times a week   Frequency of Social Gatherings with Friends and Family: Once a week   Attends Religious Services: Never   Marine scientist or Organizations: No   Attends Music therapist: Never   Marital Status: Divorced  Human resources officer Violence: Not At Risk   Fear of Current or Ex-Partner: No   Emotionally Abused: No   Physically Abused: No   Sexually Abused: No     FAMILY HISTORY:  Family History  Problem Relation Age of Onset   Diabetes Mother        type 2   Coronary artery disease Mother    Deep vein thrombosis Mother    Colon cancer Father     Asthma Brother    Diabetes Brother        type 2   Breast cancer Neg Hx  REVIEW OF SYSTEMS:  Review of Systems  Constitutional: Negative for chills and fever.  HENT: Negative for congestion and sore throat.   Respiratory: Negative for cough and shortness of breath.   Cardiovascular: Negative for chest pain and palpitations.  Gastrointestinal: Positive for abdominal pain, nausea and vomiting. Negative for blood in stool, constipation and diarrhea.  Genitourinary: Positive for hematuria. Negative for dysuria.  All other systems reviewed and are negative.   VITAL SIGNS:  Pulse Rate:  [67-142] 86 (07/30 1535) Resp:  [13-25] 20 (07/30 1535) BP: (114-139)/(58-95) 125/58 (07/30 1535) SpO2:  [90 %-97 %] 90 % (07/30 1531) Weight:  [69.9 kg] 69.9 kg (07/29 2356)     Height: 5\' 4"  (162.6 cm) Weight: 69.9 kg BMI (Calculated): 26.42   INTAKE/OUTPUT:  07/29 0701 - 07/30 0700 In: 622 [I.V.:72; IV Piggyback:550] Out: -   PHYSICAL EXAM:  Physical Exam Vitals and nursing note reviewed. Exam conducted with a chaperone present.  Constitutional:      General: She is not in acute distress.    Appearance: Normal appearance. She is obese. She is not ill-appearing.     Comments: Appears uncomfortable  HENT:     Head: Normocephalic and atraumatic.     Nose:     Comments: NGT in place Eyes:     General: No scleral icterus.    Conjunctiva/sclera: Conjunctivae normal.  Cardiovascular:     Rate and Rhythm: Tachycardia present. Rhythm irregularly irregular.     Pulses: Normal pulses.  Pulmonary:     Effort: Pulmonary effort is normal. No respiratory distress.     Comments: On [place] Abdominal:     General: A surgical scar is present. There is distension.     Palpations: Abdomen is soft.     Tenderness: There is no abdominal tenderness. There is no guarding or rebound.     Comments: Abdomen is soft, mild distension, no gross tenderness, multiple previous surgical scars present   Genitourinary:    Comments: deferred Musculoskeletal:     Right lower leg: No edema.     Left lower leg: No edema.  Skin:    General: Skin is warm and dry.     Coloration: Skin is not pale.     Findings: No erythema.  Neurological:     General: No focal deficit present.     Mental Status: She is alert and oriented to person, place, and time.  Psychiatric:        Mood and Affect: Mood normal.        Behavior: Behavior normal.      Labs:  CBC Latest Ref Rng & Units 04/28/2020 04/27/2020 04/23/2020  WBC 4.0 - 10.5 K/uL 13.9(H) 14.4(H) 12.6(H)  Hemoglobin 12.0 - 15.0 g/dL 8.2(L) 8.6(L) 8.1(L)  Hematocrit 36 - 46 % 27.7(L) 27.3(L) 26.2(L)  Platelets 150 - 400 K/uL 548(H) 571(H) 461(H)   CMP Latest Ref Rng & Units 04/28/2020 04/27/2020 04/23/2020  Glucose 70 - 99 mg/dL 111(H) 118(H) 101(H)  BUN 8 - 23 mg/dL 20 21 11   Creatinine 0.44 - 1.00 mg/dL 0.66 0.81 0.74  Sodium 135 - 145 mmol/L 134(L) 134(L) 136  Potassium 3.5 - 5.1 mmol/L 3.7 3.5 3.9  Chloride 98 - 111 mmol/L 97(L) 96(L) 99  CO2 22 - 32 mmol/L 26 26 27   Calcium 8.9 - 10.3 mg/dL 7.8(L) 8.1(L) 8.5(L)  Total Protein 6.5 - 8.1 g/dL - 6.5 6.8  Total Bilirubin 0.3 - 1.2 mg/dL - 0.8 0.3  Alkaline Phos 38 - 126 U/L -  66 69  AST 15 - 41 U/L - 12(L) 13(L)  ALT 0 - 44 U/L - 8 8    Imaging studies:   CT Abdomen/pelvis (04/28/2020) personally reviewed showing dilated loops of small bowel consistent with SBO likely attributable to carcinomatosis, and radiologist report reviewed:  IMPRESSION: Findings consistent with a partial small bowel obstruction down to the level of the right lower abdomen where there is a gradual area of narrowing within the distal ileal loops. No definite focal transition point however is noted.   Assessment/Plan: (ICD-10's: K56.609) 74 y.o. female with unfortunate situation, small bowel obstruction attributable to metastatic colon cancer with carcinomatosis, complicated by multiple pertinent  comorbidities.   - Unfortunately, in these situation there are no viable surgical options available. Recommend continue conservative management with NGT decompression, NPO, IVF resuscitation  - Continue to monitor abdominal examination; on-going bowel function  - Can consider morning KUBs as needed  - Recommend re-engage palliative care in house to continue to discuss [place] -vs- hospice  - Further management per primary service; we will follow peripherally  All of the above findings and recommendations were discussed with the patient, and all of patient's questions were answered to her expressed satisfaction.  Thank you for the opportunity to participate in this patient's care.   -- Edison Simon, PA-C [facility] 04/28/2020, 3:57 PM [phone] M-F: 7am - 4pm  I saw and evaluated the patient.  I agree with the above documentation, exam, and plan, which I have edited where appropriate. Fredirick Maudlin  4:25 PM

## 2020-04-28 NOTE — ED Provider Notes (Signed)
[facility] Emergency Department Provider Note  ____________________________________________  Time seen: Approximately 12:10 AM  I have reviewed the triage vital signs and the nursing notes.   HISTORY  Chief Complaint Nausea, Atrial Fibrillation, and Tachycardia   HPI Kimberly [name] is a 74 y.o. female the history of metastatic colon cancer, COPD, chronic respiratory failure is on 2 L nasal cannula, anemia, A. fib not on anticoagulation, hypertension C. difficile colitis who presents for evaluation of nausea and tachycardia.  Patient reports that she has not had a bowel movement in a week.  She was started on lactulose.  She took the first dose this evening and since then has been having severe nausea and dry heaving.  She is complaining of abdominal pain which is chronic for her due to her cancer.  She reports that she has not passed gas for the last 5 days.  She reports that her oncologist did do a CT scan last week to rule out SBO before starting her on lactulose and the CT was negative.   Patient denies vomiting, chest pain, changes in her chronic shortness of breath, fever or chills, dysuria or hematuria.  Past Medical History:  Diagnosis Date  . Breast cancer, left ([place]) 2004   Lumpectomy and rad tx's.  . Chronic back pain   . Colon cancer ([place]) 2018  . COPD (chronic obstructive pulmonary disease) ([place])   . Degenerative disc disease, lumbar 04/2013  . Family history of adverse reaction to anesthesia    son arrested after anesthesia  . GERD (gastroesophageal reflux disease)   . Hypertension   . Iron deficiency anemia 05/27/2017  . Personal history of chemotherapy 2018   Colon  . Personal history of radiation therapy    Breast 2004  . Personal history of radiation therapy 2018   Colon  . Postprocedural intraabdominal abscess 09/01/2018  . Small bowel obstruction ([place]) 04/16/2017  . Wears dentures    full upper    Patient Active Problem List    Diagnosis Date Noted  . Atrial fibrillation with RVR ([place]) 04/28/2020  . Other fatigue 01/22/2020  . A-fib ([place]) 01/10/2020  . Metastatic malignant neoplasm ([place])   . Encounter for antineoplastic chemotherapy 12/24/2019  . Infusion reaction 12/24/2019  . Anaphylaxis 12/24/2019  . Shortness of breath   . Atrial fibrillation with rapid ventricular response ([name]) 12/08/2019  . Leukocytosis 12/08/2019  . Thrombocytosis ([place]) 12/08/2019  . Iron deficiency anemia due to chronic blood loss 12/08/2019  . Nocturnal hypoxemia 12/08/2019  . Goals of care, counseling/discussion 11/30/2019  . Personal history of colon cancer   . Intestinal bypass or anastomosis status   . Colonic edema   . Chronic obstructive pulmonary disease ([place])   . Dependence on nocturnal oxygen therapy 08/12/2018  . Clostridium difficile colitis 07/31/2018  . S/P colostomy takedown 06/25/2018  . Parastomal hernia without obstruction or gangrene   . Status post colon resection   . Colostomy status ([place])   . Colostomy prolapse ([place])   . Iron deficiency anemia 05/27/2017  . Port-A-Cath in place   . Left peroneal vein thrombosis ([place])   . Malignant neoplasm of colon ([place]) 04/26/2017  . Generalized abdominal pain   . Loss of weight   . Gastritis without bleeding   . Absence of bladder continence 04/01/2017  . Asthmatic breathing 04/01/2017  . OP (osteoporosis) 02/11/2017  . Skin lesion 02/11/2017  . Breast swelling 02/11/2017  . Narrowing of intervertebral disc space 02/11/2017  . Family  history of colon cancer 02/11/2017  . Foot pain 02/11/2017  . Arthralgia of hip 02/11/2017  . Personal history of malignant neoplasm of breast 02/11/2017  . Eczema intertrigo 02/11/2017  . Neuropathy 02/11/2017  . Osteoarthrosis, unspecified whether generalized or localized, involving lower leg 02/11/2017  . Coronary atherosclerosis 01/29/2017  . Smoking greater than 30 pack years 01/10/2017  . Vitamin D deficiency 08/21/2016  .  Hyperlipidemia 11/10/2015  . Chronic back pain 05/26/2015  . DDD (degenerative disc disease), lumbosacral 05/26/2015  . History of colon polyps 04/27/2007    Past Surgical History:  Procedure Laterality Date  . APPENDECTOMY  1965  . BREAST EXCISIONAL BIOPSY Left 08/01/2003   lumpectomy rad 11/04-2/28/2005  . BREAST SURGERY    . CESAREAN SECTION     x3  . COLON SURGERY    . COLONOSCOPY W/ POLYPECTOMY    . COLONOSCOPY WITH PROPOFOL N/A 04/09/2018   Procedure: COLONOSCOPY WITH PROPOFOL;  Surgeon: Virgel Manifold, MD;  Location: [facility];  Service: Gastroenterology;  Laterality: N/A;  . COLONOSCOPY WITH PROPOFOL N/A 08/13/2019   Procedure: COLONOSCOPY WITH PROPOFOL;  Surgeon: Virgel Manifold, MD;  Location: [facility];  Service: Endoscopy;  Laterality: N/A;  . COLOSTOMY TAKEDOWN N/A 06/25/2018   Procedure: COLOSTOMY TAKEDOWN;  Surgeon: Jules Husbands, MD;  Location: [facility];  Service: General;  Laterality: N/A;  . ESOPHAGOGASTRODUODENOSCOPY (EGD) WITH PROPOFOL N/A 04/04/2017   Procedure: ESOPHAGOGASTRODUODENOSCOPY (EGD) WITH PROPOFOL;  Surgeon: [name], MD;  Location: [facility];  Service: Endoscopy;  Laterality: N/A;  . FRACTURE SURGERY    . HIP FRACTURE SURGERY Left 01/25/2012  . LAPAROSCOPY N/A 12/07/2019   Procedure: LAPAROSCOPY DIAGNOSTIC;  Surgeon: Jules Husbands, MD;  Location: [facility];  Service: General;  Laterality: N/A;  . LAPAROTOMY N/A 04/15/2017   Procedure: EXPLORATORY LAPAROTOMY;  Surgeon: Clayburn Pert, MD;  Location: [facility];  Service: General;  Laterality: N/A;  . NECK SURGERY  12/2011  . PARASTOMAL HERNIA REPAIR N/A 12/03/2017   Procedure: HERNIA REPAIR PARASTOMAL;  Surgeon: Jules Husbands, MD;  Location: [facility];  Service: General;  Laterality: N/A;  . PARASTOMAL HERNIA REPAIR N/A 06/25/2018   Procedure: HERNIA REPAIR PARASTOMAL;  Surgeon: Jules Husbands, MD;  Location: [facility];  Service: General;  Laterality: N/A;  . PORTACATH  PLACEMENT Right 05/21/2017   Procedure: INSERTION PORT-A-CATH;  Surgeon: Clayburn Pert, MD;  Location: [facility];  Service: General;  Laterality: Right;  . PORTACATH PLACEMENT Right 12/07/2019   Procedure: INSERTION PORT-A-CATH;  Surgeon: Jules Husbands, MD;  Location: [facility];  Service: General;  Laterality: Right;  . WRIST FRACTURE SURGERY      Prior to Admission medications   Medication Sig Start Date End Date Taking? Authorizing Provider  apixaban (ELIQUIS) 5 MG TABS tablet Take 1 tablet (5 mg total) by mouth 2 (two) times daily. 01/18/20   Birdie Sons, MD  bisacodyl (DULCOLAX) 10 MG suppository Place 1 suppository (10 mg total) rectally daily as needed for moderate constipation. 04/24/20   Borders, Kirt Boys, NP  budesonide-formoterol (SYMBICORT) 80-4.5 MCG/ACT inhaler Inhale 2 puffs into the lungs 2 (two) times daily. 12/25/19   Lavina Hamman, MD  calcium citrate-vitamin D (CITRACAL+D) 315-200 MG-UNIT tablet Take 1 tablet by mouth daily.    [provider]  Cholecalciferol (VITAMIN D3) 50 MCG (2000 UT) TABS Take 2,000 Units by mouth daily. 11/05/19   Birdie Sons, MD  ciprofloxacin (CIPRO) 500 MG tablet Take 1 tablet (500 mg  total) by mouth 2 (two) times daily. 04/18/20   Earlie Server, MD  diphenhydrAMINE (BENADRYL) 25 MG tablet Take 2 tablets (50 mg total) by mouth at bedtime as needed for itching or allergies. 12/25/19   Lavina Hamman, MD  EPINEPHrine 0.3 mg/0.3 mL IJ SOAJ injection Inject 0.3 mLs (0.3 mg total) into the muscle as needed for anaphylaxis. 12/25/19   Lavina Hamman, MD  ferrous sulfate 325 (65 FE) MG EC tablet Take 1 tablet (325 mg total) by mouth 2 (two) times daily with a meal. 04/18/20   Earlie Server, MD  furosemide (LASIX) 20 MG tablet TAKE 1 TABLET BY MOUTH EVERY DAY AS NEEDED 03/04/20   Earlie Server, MD  gabapentin (NEURONTIN) 400 MG capsule TAKE 1 CAPSULE THREE TIMES DAILY 11/08/19   Earlie Server, MD  HYDROcodone-acetaminophen (NORCO) 7.5-325 MG tablet Take 1-2 tablets by  mouth every 4 (four) hours as needed for moderate pain. 04/11/20   Borders, Kirt Boys, NP  LORazepam (ATIVAN) 0.5 MG tablet Take 0.5 mg by mouth every 4 (four) hours as needed. 04/11/20   [provider]  magnesium chloride (SLOW-MAG) 64 MG TBEC SR tablet Take 1 tablet (64 mg total) by mouth daily. 01/22/20   Earlie Server, MD  metoprolol succinate (TOPROL-XL) 100 MG 24 hr tablet TAKE 1 TABLET (100 MG TOTAL) BY MOUTH DAILY. TAKE WITH OR IMMEDIATELY FOLLOWING A MEAL. 03/28/20   Birdie Sons, MD  morphine (MS CONTIN) 15 MG 12 hr tablet Take 1 tablet (15 mg total) by mouth every 12 (twelve) hours. 04/24/20   Borders, Kirt Boys, NP  Multiple Vitamins-Minerals (WOMENS MULTI VITAMIN & MINERAL PO) Take 2 tablets by mouth daily.     [provider]  omeprazole (PRILOSEC) 20 MG capsule Take 1 capsule (20 mg total) by mouth daily. 03/10/19   Birdie Sons, MD  ondansetron (ZOFRAN) 8 MG tablet Take by mouth. 01/28/20   [provider]  pravastatin (PRAVACHOL) 40 MG tablet TAKE 1 TABLET EVERY DAY 05/20/19   Birdie Sons, MD  Probiotic Product (PROBIOTIC DAILY PO) Take 1 capsule by mouth daily.     [provider]  senna-docusate (SENOKOT-S) 8.6-50 MG tablet Take 2 tablets by mouth daily. 12/24/19   Earlie Server, MD    Allergies Erbitux [cetuximab], Betadine [povidone iodine], Iodine, Other, and Sinus formula [cholestatin]  Family History  Problem Relation Age of Onset  . Diabetes Mother        type 2  . Coronary artery disease Mother   . Deep vein thrombosis Mother   . Colon cancer Father   . Asthma Brother   . Diabetes Brother        type 2  . Breast cancer Neg Hx     Social History Social History   Tobacco Use  . Smoking status: Former Smoker    Packs/day: 0.25    Years: 55.00    Pack years: 13.75    Types: Cigarettes    Quit date: 05/21/2017    Years since quitting: 2.9  . Smokeless tobacco: Never Used  . Tobacco comment: started age 71 1/3 to 1 ppd  Vaping  Use  . Vaping Use: Former  Substance Use Topics  . Alcohol use: Yes    Alcohol/week: 0.0 standard drinks    Comment: occasionally drinks beer / 1 month  . Drug use: No    Review of Systems  Constitutional: Negative for fever. Eyes: Negative for visual changes. ENT: Negative for sore throat. Neck:  No neck pain  Cardiovascular: Negative for chest pain. Respiratory: Negative for shortness of breath. Gastrointestinal: + abdominal pain, nausea, constipation. No vomiting or diarrhea. Genitourinary: Negative for dysuria. Musculoskeletal: Negative for back pain. Skin: Negative for rash. Neurological: Negative for headaches, weakness or numbness. Psych: No SI or HI  ____________________________________________   PHYSICAL EXAM:  VITAL SIGNS: ED Triage Vitals  Enc Vitals Group     BP 04/27/20 2330 (!) 133/95     Pulse Rate 04/27/20 2330 (!) 142     Resp 04/27/20 2330 (!) 25     Temp --      Temp src --      SpO2 04/27/20 2330 96 %     Weight 04/27/20 2356 154 lb (69.9 kg)     Height 04/27/20 2356 5\' 4"  (1.626 m)     Head Circumference --      Peak Flow --      Pain Score 04/27/20 2356 0     Pain Loc --      Pain Edu? --      Excl. in [place]? --     Constitutional: Alert and oriented, chronically ill appearing.  HEENT:      Head: Normocephalic and atraumatic.         Eyes: Conjunctivae are normal. Sclera is non-icteric.       Mouth/Throat: Mucous membranes are moist.       Neck: Supple with no signs of meningismus. Cardiovascular: Irregularly irregular rhythm with tachycardic rate. Respiratory: Slightly increased work of breathing, tachypneic, satting in the mid 90s on her baseline 2 L nasal cannula, lungs are clear to auscultation with no wheezing or crackles Gastrointestinal: Soft, mild diffuse tenderness, and non distended with positive bowel sounds. No rebound or guarding. Musculoskeletal: No edema, cyanosis, or erythema of extremities. Neurologic: Normal speech and  language. Face is symmetric. Moving all extremities. No gross focal neurologic deficits are appreciated. Skin: Skin is warm, dry and intact. No rash noted. Psychiatric: Mood and affect are normal. Speech and behavior are normal.  ____________________________________________   LABS (all labs ordered are listed, but only abnormal results are displayed)  Labs Reviewed  CBC WITH DIFFERENTIAL/PLATELET - Abnormal; Notable for the following components:      Result Value   WBC 14.4 (*)    RBC 3.36 (*)    Hemoglobin 8.6 (*)    HCT 27.3 (*)    MCH 25.6 (*)    RDW 17.3 (*)    Platelets 571 (*)    nRBC 0.4 (*)    Neutro Abs 11.3 (*)    Monocytes Absolute 1.9 (*)    Abs Immature Granulocytes 0.10 (*)    All other components within normal limits  COMPREHENSIVE METABOLIC PANEL - Abnormal; Notable for the following components:   Sodium 134 (*)    Chloride 96 (*)    Glucose, Bld 118 (*)    Calcium 8.1 (*)    Albumin 3.0 (*)    AST 12 (*)    All other components within normal limits  SARS CORONAVIRUS 2 BY RT PCR (HOSPITAL ORDER, [place])  URINALYSIS, COMPLETE (UACMP) WITH MICROSCOPIC   ____________________________________________  EKG  ED ECG REPORT I, Rudene Re, the attending physician, personally viewed and interpreted this ECG.  Atrial fibrillation, rate of 159, normal QTC, normal axis, no ST elevations. ____________________________________________  RADIOLOGY  I have personally reviewed the images performed during this visit and I agree with the Radiologist's read.   Interpretation  by Radiologist:  CT ABDOMEN PELVIS WO CONTRAST  Result Date: 04/28/2020 CLINICAL DATA:  Nonlocalized abdominal pain with nausea EXAM: CT ABDOMEN AND PELVIS WITHOUT CONTRAST TECHNIQUE: Multidetector CT imaging of the abdomen and pelvis was performed following the standard protocol without IV contrast. COMPARISON:  None. FINDINGS: Lower chest: The visualized heart  size within normal limits. No pericardial fluid/thickening. No hiatal hernia. Patchy airspace opacities seen at the posterior left lung base. Hepatobiliary: Although limited due to the lack of intravenous contrast, normal in appearance without gross focal abnormality. No evidence of calcified gallstones or biliary ductal dilatation. Pancreas:  Unremarkable.  No surrounding inflammatory changes. Spleen: Normal in size. Although limited due to the lack of intravenous contrast, normal in appearance. Adrenals/Urinary Tract: Both adrenal glands appear normal. The kidneys and collecting system appear normal without evidence of urinary tract calculus or hydronephrosis. Bladder is unremarkable. Stomach/Bowel: There is a mildly dilated fluid and food stuff filled stomach. Dilated air and fluid-filled loops small bowel are seen throughout the mid abdomen measuring up to 4.5 cm down to the level of the distal ileum within the right lower quadrant where there is a gradual area of narrowing which is not well seen. The distal ileal loops and terminal ileum are decompressed. There is been a prior partial right colectomy with surgical sutures noted. A moderate amount of colonic stool is seen throughout. Scattered colonic diverticula are seen. Vascular/Lymphatic: There are no enlarged abdominal or pelvic lymph nodes. Scattered aortic atherosclerotic calcifications are seen without aneurysmal dilatation. Reproductive: The deep pelvis is unremarkable. Other: No evidence of abdominal wall mass or hernia. Musculoskeletal: No acute or significant osseous findings. IMPRESSION: Findings consistent with a partial small bowel obstruction down to the level of the right lower abdomen where there is a gradual area of narrowing within the distal ileal loops. No definite focal transition point however is noted. Aortic Atherosclerosis (ICD10-I70.0). Electronically Signed   By: Prudencio Pair M.D.   On: 04/28/2020 01:08   DG Abdomen Acute  W/Chest  Result Date: 04/27/2020 CLINICAL DATA:  74 year old female with abdominal pain, nausea, atrial fibrillation with RVR. EXAM: DG ABDOMEN ACUTE W/ 1V CHEST COMPARISON:  CT Abdomen and Pelvis 04/19/2020 and earlier. FINDINGS: Portable AP upright view at 2336 hours. Increased elevation of the left hemidiaphragm from the CT last week. Normal cardiac size and mediastinal contours. Visualized tracheal air column is within normal limits. Increased pulmonary interstitial markings appear stable. Right chest Port-A-Cath which is accessed. No pneumothorax. No definite pleural effusion. Upright and supine views of the abdomen and pelvis. Increased gastric and small bowel air since the prior CT, including dilated 4 cm left lower quadrant small bowel loops. Previous partial colectomy, but little to no gas is identified in the residual large bowel. Superimposed mesenteric masses demonstrated by CT. No pneumoperitoneum identified. Stable visualized osseous structures. IMPRESSION: 1. Appearance suspicious for new small-bowel obstruction since the CT 04/19/2020, perhaps secondary to the mesenteric mass seen at that time. 2. No pneumoperitoneum identified. 3. New elevation of the left hemidiaphragm probably related to #1. No definite pleural fluid. Electronically Signed   By: Genevie Ann M.D.   On: 04/27/2020 23:56     ____________________________________________   PROCEDURES  Procedure(s) performed:yes .1-3 Lead EKG Interpretation Performed by: Rudene Re, MD Authorized by: Rudene Re, MD     Interpretation: abnormal     ECG rate assessment: tachycardic     Rhythm: atrial fibrillation     Critical Care performed: yes  CRITICAL CARE Performed by:  Rudene Re  ?  Total critical care time: 45 min  Critical care time was exclusive of separately billable procedures and treating other patients.  Critical care was necessary to treat or prevent imminent or life-threatening  deterioration.  Critical care was time spent personally by me on the following activities: development of treatment plan with patient and/or surrogate as well as nursing, discussions with consultants, evaluation of patient's response to treatment, examination of patient, obtaining history from patient or surrogate, ordering and performing treatments and interventions, ordering and review of laboratory studies, ordering and review of radiographic studies, pulse oximetry and re-evaluation of patient's condition.  ____________________________________________   INITIAL IMPRESSION / ASSESSMENT AND PLAN / ED COURSE   74 y.o. female the history of metastatic colon cancer, COPD, chronic respiratory failure is on 2 L nasal cannula, anemia, A. fib not on anticoagulation, hypertension C. difficile colitis who presents for evaluation of nausea and tachycardia.  Patient has not been having bowel movements or passing flatus for 5 days.  Had a CT 7 days ago showing diffuse metastatic disease with no evidence of SBO.  She is not distended and is positive bowel sounds, she is actively vomiting on arrival to the emergency room.  She is also in A. fib with RVR.  She has a history of such.  Patient was given 50 mg of IV Cardizem with improvement of her rate and started on a drip.  Given IV mag for rate control and gentle hydration.  No history of CHF and patient looks euvolemic.  Initial x-rays with concerns for possible SBO, confirmed by radiology.  CT is pending.  Patient with increased oxygen requirement.  Denies chest pain or shortness of breath, lungs are clear to auscultation with no wheezing and good air movement.  Most likely due to elevation of the left hemidiaphragm which is seen on chest x-ray possibly due to SBO.  Patient was started on high flow nasal cannula with improvement of her work of breathing and hypoxia.  Labs showing stable anemia and leukocytosis.  Stable kidney function and no significant electrolyte  derangements.  Anticipate admission after CT.  Old medical records reviewed.  Patient is DNR/DNI  _________________________ 1:41 AM on 04/28/2020 -----------------------------------------  CT consistent with SBO.  NG tube will be placed.  Care discussed with patient and her daughter who is at bedside.  Will consult hospitalist for admission.    _____________________________________________ Please note:  Patient was evaluated in Emergency Department today for the symptoms described in the history of present illness. Patient was evaluated in the context of the global COVID-19 pandemic, which necessitated consideration that the patient might be at risk for infection with the SARS-CoV-2 virus that causes COVID-19. Institutional protocols and algorithms that pertain to the evaluation of patients at risk for COVID-19 are in a state of rapid change based on information released by regulatory bodies including the CDC and federal and state organizations. These policies and algorithms were followed during the patient's care in the ED.  Some ED evaluations and interventions may be delayed as a result of limited staffing during the pandemic.   [place] Controlled Substance Database was reviewed by me. ____________________________________________   FINAL CLINICAL IMPRESSION(S) / ED DIAGNOSES   Final diagnoses:  Atrial fibrillation with RVR (HCC)  Acute on chronic respiratory failure with hypoxia (HCC)  SBO (small bowel obstruction) ([place])      NEW MEDICATIONS STARTED DURING THIS VISIT:  ED Discharge Orders    None  Note:  This document was prepared using Dragon voice recognition software and may include unintentional dictation errors.    Alfred Levins, [place], MD 04/28/20 0157

## 2020-04-28 NOTE — ED Notes (Signed)
Pt SpO2 noted to be 89-90% on 8L humidified O2. RT contacted to assess adjusting. Pt in NAD

## 2020-04-28 NOTE — ED Notes (Signed)
Pt c/o 7/10 back pain. Prn morphine given

## 2020-04-28 NOTE — H&P (Signed)
History and Physical    Kimberly [name]:[id number] DOB: 21-Mar-1946 DOA: 04/27/2020  PCP: Birdie Sons, MD   Patient coming from: Home   Chief Complaint: Abdominal pain, N/V   HPI: Kimberly [name] is a 74 y.o. female with medical history significant for metastatic colon cancer, COPD with chronic hypoxic respiratory failure, hypertension, and atrial fibrillation, now presenting to the emergency department with abdominal pain, nausea, and vomiting.  Patient has had close to a week of abdominal discomfort and nausea, was started on lactulose for constipation but developed worsening in her nausea and vomiting.  EMS was eventually called and the patient was found to be in atrial fibrillation with rate 180.  The patient denies any chest pain, palpitations, or lightheadedness.  She denies any recent change in her chronic cough and dyspnea.  She notes that she did not tolerate chemotherapy well and is no longer interested in any treatment for her cancer.  She reports that her anticoagulation was discontinued recently due to hematuria that was attributed to tumor invasion of her urinary bladder.  ED Course: Upon arrival to the ED, patient is found to be saturating mid 90s on 2 L/min of supplemental oxygen, heart rate in the 150s, and stable blood pressure.  EKG features atrial fibrillation with rate 159 and repolarization abnormality.  CT is concerning for SBO.  Chemistry panel with mild hyponatremia.  CBC notable for a stable chronic leukocytosis, thrombocytosis, and improved normocytic anemia.  Patient was given 15 mg, and then 20 mg of IV diltiazem, and then started on diltiazem infusion.  She was also treated with magnesium, Zofran, and 500 cc of normal saline in the ED.  Review of Systems:  All other systems reviewed and apart from HPI, are negative.  Past Medical History:  Diagnosis Date  . Breast cancer, left ([place]) 2004   Lumpectomy and rad tx's.  . Chronic back pain   . Colon cancer ([place])  2018  . COPD (chronic obstructive pulmonary disease) ([place])   . Degenerative disc disease, lumbar 04/2013  . Family history of adverse reaction to anesthesia    son arrested after anesthesia  . GERD (gastroesophageal reflux disease)   . Hypertension   . Iron deficiency anemia 05/27/2017  . Personal history of chemotherapy 2018   Colon  . Personal history of radiation therapy    Breast 2004  . Personal history of radiation therapy 2018   Colon  . Postprocedural intraabdominal abscess 09/01/2018  . Small bowel obstruction ([place]) 04/16/2017  . Wears dentures    full upper    Past Surgical History:  Procedure Laterality Date  . APPENDECTOMY  1965  . BREAST EXCISIONAL BIOPSY Left 08/01/2003   lumpectomy rad 11/04-2/28/2005  . BREAST SURGERY    . CESAREAN SECTION     x3  . COLON SURGERY    . COLONOSCOPY W/ POLYPECTOMY    . COLONOSCOPY WITH PROPOFOL N/A 04/09/2018   Procedure: COLONOSCOPY WITH PROPOFOL;  Surgeon: Virgel Manifold, MD;  Location: [facility];  Service: Gastroenterology;  Laterality: N/A;  . COLONOSCOPY WITH PROPOFOL N/A 08/13/2019   Procedure: COLONOSCOPY WITH PROPOFOL;  Surgeon: Virgel Manifold, MD;  Location: [place];  Service: Endoscopy;  Laterality: N/A;  . COLOSTOMY TAKEDOWN N/A 06/25/2018   Procedure: COLOSTOMY TAKEDOWN;  Surgeon: Jules Husbands, MD;  Location: [facility];  Service: General;  Laterality: N/A;  . ESOPHAGOGASTRODUODENOSCOPY (EGD) WITH PROPOFOL N/A 04/04/2017   Procedure: ESOPHAGOGASTRODUODENOSCOPY (EGD) WITH PROPOFOL;  Surgeon: [name], MD;  Location: [place];  Service: Endoscopy;  Laterality: N/A;  . FRACTURE SURGERY    . HIP FRACTURE SURGERY Left 01/25/2012  . LAPAROSCOPY N/A 12/07/2019   Procedure: LAPAROSCOPY DIAGNOSTIC;  Surgeon: Jules Husbands, MD;  Location: [facility];  Service: General;  Laterality: N/A;  . LAPAROTOMY N/A 04/15/2017   Procedure: EXPLORATORY LAPAROTOMY;  Surgeon: Clayburn Pert, MD;  Location:  [facility];  Service: General;  Laterality: N/A;  . NECK SURGERY  12/2011  . PARASTOMAL HERNIA REPAIR N/A 12/03/2017   Procedure: HERNIA REPAIR PARASTOMAL;  Surgeon: Jules Husbands, MD;  Location: [facility];  Service: General;  Laterality: N/A;  . PARASTOMAL HERNIA REPAIR N/A 06/25/2018   Procedure: HERNIA REPAIR PARASTOMAL;  Surgeon: Jules Husbands, MD;  Location: [facility];  Service: General;  Laterality: N/A;  . PORTACATH PLACEMENT Right 05/21/2017   Procedure: INSERTION PORT-A-CATH;  Surgeon: Clayburn Pert, MD;  Location: [facility];  Service: General;  Laterality: Right;  . PORTACATH PLACEMENT Right 12/07/2019   Procedure: INSERTION PORT-A-CATH;  Surgeon: Jules Husbands, MD;  Location: [facility];  Service: General;  Laterality: Right;  . WRIST FRACTURE SURGERY      Social History:   reports that she quit smoking about 2 years ago. Her smoking use included cigarettes. She has a 13.75 pack-year smoking history. She has never used smokeless tobacco. She reports current alcohol use. She reports that she does not use drugs.  Allergies  Allergen Reactions  . Erbitux [Cetuximab] Anaphylaxis  . Betadine [Povidone Iodine] Other (See Comments)    Skin Irritation   . Iodine Other (See Comments)    Skin Irritation   . Other Nausea And Vomiting    Antihystamines  . Sinus Formula [Cholestatin] Nausea Only    Family History  Problem Relation Age of Onset  . Diabetes Mother        type 2  . Coronary artery disease Mother   . Deep vein thrombosis Mother   . Colon cancer Father   . Asthma Brother   . Diabetes Brother        type 2  . Breast cancer Neg Hx      Prior to Admission medications   Medication Sig Start Date End Date Taking? Authorizing Provider  apixaban (ELIQUIS) 5 MG TABS tablet Take 1 tablet (5 mg total) by mouth 2 (two) times daily. 01/18/20   Birdie Sons, MD  bisacodyl (DULCOLAX) 10 MG suppository Place 1 suppository (10 mg total) rectally daily as needed for moderate  constipation. 04/24/20   Borders, Kirt Boys, NP  budesonide-formoterol (SYMBICORT) 80-4.5 MCG/ACT inhaler Inhale 2 puffs into the lungs 2 (two) times daily. 12/25/19   Lavina Hamman, MD  calcium citrate-vitamin D (CITRACAL+D) 315-200 MG-UNIT tablet Take 1 tablet by mouth daily.    [provider]  Cholecalciferol (VITAMIN D3) 50 MCG (2000 UT) TABS Take 2,000 Units by mouth daily. 11/05/19   Birdie Sons, MD  ciprofloxacin (CIPRO) 500 MG tablet Take 1 tablet (500 mg total) by mouth 2 (two) times daily. 04/18/20   Earlie Server, MD  diphenhydrAMINE (BENADRYL) 25 MG tablet Take 2 tablets (50 mg total) by mouth at bedtime as needed for itching or allergies. 12/25/19   Lavina Hamman, MD  EPINEPHrine 0.3 mg/0.3 mL IJ SOAJ injection Inject 0.3 mLs (0.3 mg total) into the muscle as needed for anaphylaxis. 12/25/19   Lavina Hamman, MD  ferrous sulfate 325 (65 FE) MG EC tablet Take 1 tablet (325 mg  total) by mouth 2 (two) times daily with a meal. 04/18/20   Earlie Server, MD  furosemide (LASIX) 20 MG tablet TAKE 1 TABLET BY MOUTH EVERY DAY AS NEEDED 03/04/20   Earlie Server, MD  gabapentin (NEURONTIN) 400 MG capsule TAKE 1 CAPSULE THREE TIMES DAILY 11/08/19   Earlie Server, MD  HYDROcodone-acetaminophen (NORCO) 7.5-325 MG tablet Take 1-2 tablets by mouth every 4 (four) hours as needed for moderate pain. 04/11/20   Borders, Kirt Boys, NP  LORazepam (ATIVAN) 0.5 MG tablet Take 0.5 mg by mouth every 4 (four) hours as needed. 04/11/20   [provider]  magnesium chloride (SLOW-MAG) 64 MG TBEC SR tablet Take 1 tablet (64 mg total) by mouth daily. 01/22/20   Earlie Server, MD  metoprolol succinate (TOPROL-XL) 100 MG 24 hr tablet TAKE 1 TABLET (100 MG TOTAL) BY MOUTH DAILY. TAKE WITH OR IMMEDIATELY FOLLOWING A MEAL. 03/28/20   Birdie Sons, MD  morphine (MS CONTIN) 15 MG 12 hr tablet Take 1 tablet (15 mg total) by mouth every 12 (twelve) hours. 04/24/20   Borders, Kirt Boys, NP  Multiple Vitamins-Minerals (WOMENS MULTI VITAMIN &  MINERAL PO) Take 2 tablets by mouth daily.     [provider]  omeprazole (PRILOSEC) 20 MG capsule Take 1 capsule (20 mg total) by mouth daily. 03/10/19   Birdie Sons, MD  ondansetron (ZOFRAN) 8 MG tablet Take by mouth. 01/28/20   [provider]  pravastatin (PRAVACHOL) 40 MG tablet TAKE 1 TABLET EVERY DAY 05/20/19   Birdie Sons, MD  Probiotic Product (PROBIOTIC DAILY PO) Take 1 capsule by mouth daily.     [provider]  senna-docusate (SENOKOT-S) 8.6-50 MG tablet Take 2 tablets by mouth daily. 12/24/19   Earlie Server, MD    Physical Exam: Vitals:   04/28/20 0006 04/28/20 0010 04/28/20 0138 04/28/20 0230  BP: 123/69 (!) 130/71 117/72 (!) 118/63  Pulse: 100 (!) 116 87 85  Resp: 13 17 15 14   SpO2: 96% 97% 95% 95%  Weight:      Height:        Constitutional: NAD, calm  Eyes: PERTLA, lids and conjunctivae normal ENMT: Mucous membranes are moist. Posterior pharynx clear of any exudate or lesions.   Neck: normal, supple, no masses, no thyromegaly Respiratory: Diminished breath sounds bilaterally with prolonged expiratory phase and occasional wheeze. No accessory muscle use.  Cardiovascular: Rate ~100 and irregularly irregular. No lower extremity edema.  Abdomen Soft, generally tender without rebound pain or guarding. Occasional high-pitched bowel sound.  Musculoskeletal: no clubbing / cyanosis. No joint deformity upper and lower extremities.   Skin: no significant rashes, lesions, ulcers. Warm, dry, well-perfused. Neurologic: CN 2-12 grossly intact. Sensation intact. Moving all extremities.  Psychiatric: Alert and oriented to person, place, and situation. Pleasant and cooperative.    Labs and Imaging on Admission: I have personally reviewed following labs and imaging studies  CBC: Recent Labs  Lab 04/23/20 0830 04/27/20 2335  WBC 12.6* 14.4*  NEUTROABS  --  11.3*  HGB 8.1* 8.6*  HCT 26.2* 27.3*  MCV 83.7 81.3  PLT 461* 026*   Basic Metabolic  Panel: Recent Labs  Lab 04/23/20 0830 04/27/20 2335  NA 136 134*  K 3.9 3.5  CL 99 96*  CO2 27 26  GLUCOSE 101* 118*  BUN 11 21  CREATININE 0.74 0.81  CALCIUM 8.5* 8.1*   GFR: Estimated Creatinine Clearance: 60.3 mL/min (by C-G formula based on SCr of 0.81 mg/dL). Liver Function  Tests: Recent Labs  Lab 04/23/20 0830 04/27/20 2335  AST 13* 12*  ALT 8 8  ALKPHOS 69 66  BILITOT 0.3 0.8  PROT 6.8 6.5  ALBUMIN 3.1* 3.0*   Recent Labs  Lab 04/23/20 0830  LIPASE 19   No results for input(s): AMMONIA in the last 168 hours. Coagulation Profile: No results for input(s): INR, PROTIME in the last 168 hours. Cardiac Enzymes: No results for input(s): CKTOTAL, CKMB, CKMBINDEX, TROPONINI in the last 168 hours. BNP (last 3 results) No results for input(s): PROBNP in the last 8760 hours. HbA1C: No results for input(s): HGBA1C in the last 72 hours. CBG: No results for input(s): GLUCAP in the last 168 hours. Lipid Profile: No results for input(s): CHOL, HDL, LDLCALC, TRIG, CHOLHDL, LDLDIRECT in the last 72 hours. Thyroid Function Tests: No results for input(s): TSH, T4TOTAL, FREET4, T3FREE, THYROIDAB in the last 72 hours. Anemia Panel: No results for input(s): VITAMINB12, FOLATE, FERRITIN, TIBC, IRON, RETICCTPCT in the last 72 hours. Urine analysis:    Component Value Date/Time   COLORURINE RED (A) 04/23/2020 0830   APPEARANCEUR HAZY (A) 04/23/2020 0830   APPEARANCEUR Hazy 01/26/2012 0108   LABSPEC 1.005 04/23/2020 0830   LABSPEC 1.021 01/26/2012 0108   PHURINE  04/23/2020 0830    TEST NOT REPORTED DUE TO COLOR INTERFERENCE OF URINE PIGMENT   GLUCOSEU (A) 04/23/2020 0830    TEST NOT REPORTED DUE TO COLOR INTERFERENCE OF URINE PIGMENT   GLUCOSEU Negative 01/26/2012 0108   HGBUR (A) 04/23/2020 0830    TEST NOT REPORTED DUE TO COLOR INTERFERENCE OF URINE PIGMENT   BILIRUBINUR (A) 04/23/2020 0830    TEST NOT REPORTED DUE TO COLOR INTERFERENCE OF URINE PIGMENT   BILIRUBINUR  Negative 01/26/2012 0108   KETONESUR (A) 04/23/2020 0830    TEST NOT REPORTED DUE TO COLOR INTERFERENCE OF URINE PIGMENT   PROTEINUR (A) 04/23/2020 0830    TEST NOT REPORTED DUE TO COLOR INTERFERENCE OF URINE PIGMENT   NITRITE (A) 04/23/2020 0830    TEST NOT REPORTED DUE TO COLOR INTERFERENCE OF URINE PIGMENT   LEUKOCYTESUR (A) 04/23/2020 0830    TEST NOT REPORTED DUE TO COLOR INTERFERENCE OF URINE PIGMENT   LEUKOCYTESUR Negative 01/26/2012 0108   Sepsis Labs: @LABRCNTIP (procalcitonin:4,lacticidven:4) ) Recent Results (from the past 240 hour(s))  Urine culture     Status: Abnormal   Collection Time: 04/18/20  2:20 PM   Specimen: Urine, Clean Catch  Result Value Ref Range Status   Specimen Description   Final    URINE, CLEAN CATCH Performed at [facility], [address] [place], [place] [address]    Special Requests   Final    NONE Performed at [facility], [address], [place] [address]    Culture 50,000 COLONIES/mL ESCHERICHIA COLI (A)  Final   Report Status 04/20/2020 FINAL  Final   Organism ID, Bacteria ESCHERICHIA COLI (A)  Final      Susceptibility   Escherichia coli - MIC*    AMPICILLIN <=2 SENSITIVE Sensitive     CEFAZOLIN <=4 SENSITIVE Sensitive     CEFTRIAXONE <=0.25 SENSITIVE Sensitive     CIPROFLOXACIN <=0.25 SENSITIVE Sensitive     GENTAMICIN <=1 SENSITIVE Sensitive     IMIPENEM <=0.25 SENSITIVE Sensitive     NITROFURANTOIN <=16 SENSITIVE Sensitive     TRIMETH/SULFA <=20 SENSITIVE Sensitive     AMPICILLIN/SULBACTAM <=2 SENSITIVE Sensitive     PIP/TAZO <=4 SENSITIVE Sensitive     * 50,000 COLONIES/mL  ESCHERICHIA COLI     Radiological Exams on Admission: CT ABDOMEN PELVIS WO CONTRAST  Result Date: 04/28/2020 CLINICAL DATA:  Nonlocalized abdominal pain with nausea EXAM: CT ABDOMEN AND PELVIS WITHOUT CONTRAST TECHNIQUE: Multidetector CT imaging of the abdomen and pelvis was performed following the standard protocol without IV  contrast. COMPARISON:  None. FINDINGS: Lower chest: The visualized heart size within normal limits. No pericardial fluid/thickening. No hiatal hernia. Patchy airspace opacities seen at the posterior left lung base. Hepatobiliary: Although limited due to the lack of intravenous contrast, normal in appearance without gross focal abnormality. No evidence of calcified gallstones or biliary ductal dilatation. Pancreas:  Unremarkable.  No surrounding inflammatory changes. Spleen: Normal in size. Although limited due to the lack of intravenous contrast, normal in appearance. Adrenals/Urinary Tract: Both adrenal glands appear normal. The kidneys and collecting system appear normal without evidence of urinary tract calculus or hydronephrosis. Bladder is unremarkable. Stomach/Bowel: There is a mildly dilated fluid and food stuff filled stomach. Dilated air and fluid-filled loops small bowel are seen throughout the mid abdomen measuring up to 4.5 cm down to the level of the distal ileum within the right lower quadrant where there is a gradual area of narrowing which is not well seen. The distal ileal loops and terminal ileum are decompressed. There is been a prior partial right colectomy with surgical sutures noted. A moderate amount of colonic stool is seen throughout. Scattered colonic diverticula are seen. Vascular/Lymphatic: There are no enlarged abdominal or pelvic lymph nodes. Scattered aortic atherosclerotic calcifications are seen without aneurysmal dilatation. Reproductive: The deep pelvis is unremarkable. Other: No evidence of abdominal wall mass or hernia. Musculoskeletal: No acute or significant osseous findings. IMPRESSION: Findings consistent with a partial small bowel obstruction down to the level of the right lower abdomen where there is a gradual area of narrowing within the distal ileal loops. No definite focal transition point however is noted. Aortic Atherosclerosis (ICD10-I70.0). Electronically Signed    By: Prudencio Pair M.D.   On: 04/28/2020 01:08   DG Abdomen Acute W/Chest  Result Date: 04/27/2020 CLINICAL DATA:  74 year old female with abdominal pain, nausea, atrial fibrillation with RVR. EXAM: DG ABDOMEN ACUTE W/ 1V CHEST COMPARISON:  CT Abdomen and Pelvis 04/19/2020 and earlier. FINDINGS: Portable AP upright view at 2336 hours. Increased elevation of the left hemidiaphragm from the CT last week. Normal cardiac size and mediastinal contours. Visualized tracheal air column is within normal limits. Increased pulmonary interstitial markings appear stable. Right chest Port-A-Cath which is accessed. No pneumothorax. No definite pleural effusion. Upright and supine views of the abdomen and pelvis. Increased gastric and small bowel air since the prior CT, including dilated 4 cm left lower quadrant small bowel loops. Previous partial colectomy, but little to no gas is identified in the residual large bowel. Superimposed mesenteric masses demonstrated by CT. No pneumoperitoneum identified. Stable visualized osseous structures. IMPRESSION: 1. Appearance suspicious for new small-bowel obstruction since the CT 04/19/2020, perhaps secondary to the mesenteric mass seen at that time. 2. No pneumoperitoneum identified. 3. New elevation of the left hemidiaphragm probably related to #1. No definite pleural fluid. Electronically Signed   By: Genevie Ann M.D.   On: 04/27/2020 23:56    EKG: Independently reviewed. Atrial fibrillation, rate 159, repolarization abnormality.   Assessment/Plan   1. Atrial fibrillation with RVR  - Patient has hx of AF, recently taken off of Eliquis due to hematuria from tumor invasion of urinary bladder, now presenting with abdominal pain and N/V and found  to be in a fib with rate in 150s  - BP has been stable and patient asymptomatic  - She was given IV diltiazem bolus x2 in ED and started on diltiazem infusion  - Continue diltiazem infusion, cardiac monitoring, may not be able to convert to  oral therapy until SBO resolved    2. SBO  - Presents with abdominal pain and N/V and is found to have SBO  - Bowel rest, NGT decompression, pain-control, close monitoring    3. Colon cancer  - Patient has adenocarcinoma of the colon with metastases, reports that she did not tolerate chemo and has elected for supportive care through hospice   4. COPD; chronic hypoxic respiratory failure  - Patient denies change in chronic cough or dyspnea  - Continue supplemental O2, inhalers   5. Anemia - Hgb 8.6, appears stable  - Monitor    6. SIRS  - Patient has leukocytosis that appears to be chronic, tachycardia in setting of AF RVR, and no apparent infectious process  - Monitor, culture if febrile    DVT prophylaxis: SCDs Code Status: DNR, confirmed at time of admission  Family Communication: Daughter updated at bedside Disposition Plan:  Patient is from: home  Anticipated d/c is to: TBD Anticipated d/c date is: 05/02/20 Patient currently: in atrial fib with rate 150s, has new SBO  Consults called: None  Admission status: Inpatient     Vianne Bulls, MD [facility]  04/28/2020, 3:05 AM

## 2020-04-28 NOTE — Progress Notes (Addendum)
Pt unable to tolerate heated hi flo. Anxiety. Sharion Settler NP in room. Called RT and placed pt on Venti mask @ 2105. Will continue to monitor. Family at bedside.   2100 Pt placed on Venti mask by RT patient tolerating well and at 91%. Family at bedside

## 2020-04-28 NOTE — ED Notes (Signed)
Pt transferred to hospital bed

## 2020-04-28 NOTE — ED Notes (Signed)
Attempted to call report to the floor RN to call back

## 2020-04-29 ENCOUNTER — Inpatient Hospital Stay: Payer: Medicare Other

## 2020-04-29 LAB — CBC
HCT: 27.2 % — ABNORMAL LOW (ref 36.0–46.0)
Hemoglobin: 8.2 g/dL — ABNORMAL LOW (ref 12.0–15.0)
MCH: 24.8 pg — ABNORMAL LOW (ref 26.0–34.0)
MCHC: 30.1 g/dL (ref 30.0–36.0)
MCV: 82.2 fL (ref 80.0–100.0)
Platelets: 474 10*3/uL — ABNORMAL HIGH (ref 150–400)
RBC: 3.31 MIL/uL — ABNORMAL LOW (ref 3.87–5.11)
RDW: 17 % — ABNORMAL HIGH (ref 11.5–15.5)
WBC: 17.8 10*3/uL — ABNORMAL HIGH (ref 4.0–10.5)
nRBC: 0.1 % (ref 0.0–0.2)

## 2020-04-29 LAB — URINALYSIS, COMPLETE (UACMP) WITH MICROSCOPIC
RBC / HPF: >50 RBC/hpf — ABNORMAL HIGH (ref 0–5)
Specific Gravity, Urine: 1.018 (ref 1.005–1.030)
Squamous Epithelial / HPF: NONE SEEN (ref 0–5)
WBC, UA: >50 WBC/hpf — ABNORMAL HIGH (ref 0–5)

## 2020-04-29 LAB — BASIC METABOLIC PANEL
Anion gap: 15 (ref 5–15)
BUN: 15 mg/dL (ref 8–23)
CO2: 25 mmol/L (ref 22–32)
Calcium: 7.6 mg/dL — ABNORMAL LOW (ref 8.9–10.3)
Chloride: 91 mmol/L — ABNORMAL LOW (ref 98–111)
Creatinine, Ser: 0.64 mg/dL (ref 0.44–1.00)
GFR calc Af Amer: >60 mL/min (ref >60)
GFR calc non Af Amer: >60 mL/min (ref >60)
Glucose, Bld: 101 mg/dL — ABNORMAL HIGH (ref 70–99)
Potassium: 3.1 mmol/L — ABNORMAL LOW (ref 3.5–5.1)
Sodium: 131 mmol/L — ABNORMAL LOW (ref 135–145)

## 2020-04-29 LAB — LACTIC ACID, PLASMA: Lactic Acid, Venous: 0.8 mmol/L (ref 0.5–1.9)

## 2020-04-29 MED ORDER — SODIUM CHLORIDE 0.9 % IV SOLN
1.0000 g | INTRAVENOUS | Status: DC
  Administered 2020-04-29 – 2020-05-02 (×4): 1 g via INTRAVENOUS
  Filled 2020-04-29: qty 1
  Filled 2020-04-29: qty 10
  Filled 2020-04-29 (×3): qty 1
  Filled 2020-04-29: qty 10

## 2020-04-29 MED ORDER — SODIUM CHLORIDE 0.9 % IV SOLN: INTRAVENOUS | Status: DC

## 2020-04-29 MED ORDER — FUROSEMIDE 10 MG/ML IJ SOLN
INTRAMUSCULAR | Status: AC
  Filled 2020-04-29: qty 4

## 2020-04-29 MED ORDER — FUROSEMIDE 10 MG/ML IJ SOLN
40.0000 mg | Freq: Once | INTRAMUSCULAR | Status: AC
  Administered 2020-04-29: 40 mg via INTRAVENOUS

## 2020-04-29 MED ORDER — POTASSIUM CHLORIDE 10 MEQ/100ML IV SOLN
10.0000 meq | INTRAVENOUS | Status: AC
  Administered 2020-04-29 (×4): 10 meq via INTRAVENOUS
  Filled 2020-04-29 (×4): qty 100

## 2020-04-29 MED ORDER — SODIUM CHLORIDE 0.9 % IV SOLN
500.0000 mg | INTRAVENOUS | Status: AC
  Administered 2020-04-29 – 2020-05-03 (×5): 500 mg via INTRAVENOUS
  Filled 2020-04-29 (×5): qty 500

## 2020-04-29 MED ORDER — DM-GUAIFENESIN ER 30-600 MG PO TB12
1.0000 | ORAL_TABLET | Freq: Two times a day (BID) | ORAL | Status: DC
  Administered 2020-05-01: 1 via ORAL
  Filled 2020-04-29 (×3): qty 1

## 2020-04-29 NOTE — Plan of Care (Signed)
  Problem: Education: Goal: Knowledge of General Education information will improve Description Including pain rating scale, medication(s)/side effects and non-pharmacologic comfort measures Outcome: Progressing   

## 2020-04-29 NOTE — Significant Event (Signed)
Rapid Response Event Note  Reason for Call : Patient's O2 had dropped to 50%    Initial Focused Assessment:  Patient had removed Non rebreather and on re application O2 went back up to 95%     Interventions: none   Plan of Care: Monitor patient   Event Summary:   MD Notified:  Call Time:2330 Arrival [id number] End [id number]  Gonzella Lex, RN

## 2020-04-29 NOTE — Hospital Course (Signed)
Gracemarie CITLALY CAMPLIN is a 74 y.o. female with medical history significant for metastatic colon cancer who was on hospice but revoked on 7/29 to pursue palliative radiation therapy, COPD with chronic hypoxic respiratory failure, hypertension, and atrial fibrillation, presented to the ED on 04/28/20 with abdominal pain, nausea, and vomiting which had progressive over a week. Was started on lactulose for constipation as outpatient, but got worse, prompting EMS to be called.  EMS found patient to be in A-fib with RVR (HR 180).  Her anticoagulation was discontinued recently due to hematuria due to tumor invasion of her urinary bladder.  In the ED, O2 sat mid 90's on 2 L/min oxygen, HR 150's, stable BP.  EKG showed A-fib with RVR 159 bpm and repolarization abnormality.  Labs notable for mild hypnatremia, stable chronic leukocytosis, thrombocytosis and improved normocytic anemia.  CT abdomen/pelvis concerning for small bowel obstruction.  Started on Cardizem gtt in the ED.   Admitted to hospitalist service.  General surgery is consulted, recommends conservative management of her SBO, with NG tube for decompression, NPO and IV fluids.  Unfortunately, patient is not a surgical candidate due to her advanced metastatic disease.

## 2020-04-29 NOTE — Progress Notes (Signed)
Unable to start flutter valve or work with the incentive spirometer this morning due to patient has been transitioned to 100%  NRB mask.  RN has stated with any movement patient desaturates quickly.  RN in patient's room with this Probation officer when this was discussed.

## 2020-04-29 NOTE — Consult Note (Signed)
[facility] SURGICAL CONSULTATION NOTE (subsequent)  HISTORY OF PRESENT ILLNESS (HPI):  75 y.o. female, well known to our service secondary to metastatic colon cancer, who presented to [facility] ED today for evaluation of abdominal pain. Patient reports around a 5 day history of generalized abdominal discomfort and decreased bowel function. Abdominal pain is somewhat chronic given her history of metastatic adenocarcinoma of the colon however she has reported no bowel movement or flatus in the last 5 days which is concerning. She tried some lactulose for this without relief and had subsequent nausea and emesis. No fever, chills. She has been following up with palliative care outpatient for this issue as well. Most recently, she had been experiencing hematuria and had seen Dr Diamantina Providence with urology and recommended stopping Eliquis, starting Abx, and discussing palliative radiation. She saw Dr Donella Stade on 07/26 in consultation, but she is uncertain is she will pursue this. She is uncertain if she continues to have hematuria or not. Work up in the ED was primarily concerning for small bowel obstruction most likely secondary to metastatic colon cancer with carcinomatosis.   Surgery is consulted by hospitalist physician Dr. Max Sane, MD in this context for evaluation and management of small bowel obstruction in the setting of metastatic colon cancer with biopsy confirmed carcinomatosis.  INTERVAL HISTORY: Patient admitted to the medicine service.  Foley catheter was placed.  She has had consistent desaturations and is currently requiring 100% nonrebreather oxygen supplementation.  She continues to have episodes of rapid atrial fibrillation.  It appears that her request has been put in for her to transfer to either the ICU or the stepdown unit, however she is currently still on the progressive care unit.  Her daughter is at bedside.  The patient is fairly somnolent but does arouse to stimulation.  She  denies any nausea or vomiting.  Her abdomen is nontender.  She denies passing any flatus.  There has not been much out of her NG tube since it was placed.  PAST MEDICAL HISTORY (PMH):  Past Medical History:  Diagnosis Date  . Breast cancer, left ([place]) 2004   Lumpectomy and rad tx's.  . Chronic back pain   . Colon cancer ([place]) 2018  . COPD (chronic obstructive pulmonary disease) ([place])   . Degenerative disc disease, lumbar 04/2013  . Family history of adverse reaction to anesthesia    son arrested after anesthesia  . GERD (gastroesophageal reflux disease)   . Hypertension   . Iron deficiency anemia 05/27/2017  . Personal history of chemotherapy 2018   Colon  . Personal history of radiation therapy    Breast 2004  . Personal history of radiation therapy 2018   Colon  . Postprocedural intraabdominal abscess 09/01/2018  . Small bowel obstruction ([place]) 04/16/2017  . Wears dentures    full upper     PAST SURGICAL HISTORY ([place]):  Past Surgical History:  Procedure Laterality Date  . APPENDECTOMY  1965  . BREAST EXCISIONAL BIOPSY Left 08/01/2003   lumpectomy rad 11/04-2/28/2005  . BREAST SURGERY    . CESAREAN SECTION     x3  . COLON SURGERY    . COLONOSCOPY W/ POLYPECTOMY    . COLONOSCOPY WITH PROPOFOL N/A 04/09/2018   Procedure: COLONOSCOPY WITH PROPOFOL;  Surgeon: Virgel Manifold, MD;  Location: [facility];  Service: Gastroenterology;  Laterality: N/A;  . COLONOSCOPY WITH PROPOFOL N/A 08/13/2019   Procedure: COLONOSCOPY WITH PROPOFOL;  Surgeon: Virgel Manifold, MD;  Location: [facility];  Service:  Endoscopy;  Laterality: N/A;  . COLOSTOMY TAKEDOWN N/A 06/25/2018   Procedure: COLOSTOMY TAKEDOWN;  Surgeon: Jules Husbands, MD;  Location: [facility];  Service: General;  Laterality: N/A;  . ESOPHAGOGASTRODUODENOSCOPY (EGD) WITH PROPOFOL N/A 04/04/2017   Procedure: ESOPHAGOGASTRODUODENOSCOPY (EGD) WITH PROPOFOL;  Surgeon: [name], MD;  Location: [facility];   Service: Endoscopy;  Laterality: N/A;  . FRACTURE SURGERY    . HIP FRACTURE SURGERY Left 01/25/2012  . LAPAROSCOPY N/A 12/07/2019   Procedure: LAPAROSCOPY DIAGNOSTIC;  Surgeon: Jules Husbands, MD;  Location: [facility];  Service: General;  Laterality: N/A;  . LAPAROTOMY N/A 04/15/2017   Procedure: EXPLORATORY LAPAROTOMY;  Surgeon: Clayburn Pert, MD;  Location: [facility];  Service: General;  Laterality: N/A;  . NECK SURGERY  12/2011  . PARASTOMAL HERNIA REPAIR N/A 12/03/2017   Procedure: HERNIA REPAIR PARASTOMAL;  Surgeon: Jules Husbands, MD;  Location: [facility];  Service: General;  Laterality: N/A;  . PARASTOMAL HERNIA REPAIR N/A 06/25/2018   Procedure: HERNIA REPAIR PARASTOMAL;  Surgeon: Jules Husbands, MD;  Location: [facility];  Service: General;  Laterality: N/A;  . PORTACATH PLACEMENT Right 05/21/2017   Procedure: INSERTION PORT-A-CATH;  Surgeon: Clayburn Pert, MD;  Location: [facility];  Service: General;  Laterality: Right;  . PORTACATH PLACEMENT Right 12/07/2019   Procedure: INSERTION PORT-A-CATH;  Surgeon: Jules Husbands, MD;  Location: [facility];  Service: General;  Laterality: Right;  . WRIST FRACTURE SURGERY       MEDICATIONS:  Prior to Admission medications   Medication Sig Start Date End Date Taking? Authorizing Provider  apixaban (ELIQUIS) 5 MG TABS tablet Take 1 tablet (5 mg total) by mouth 2 (two) times daily. 01/18/20  Yes Birdie Sons, MD  budesonide-formoterol [facility]) 80-4.5 MCG/ACT inhaler Inhale 2 puffs into the lungs 2 (two) times daily. 12/25/19  Yes Lavina Hamman, MD  calcium citrate-vitamin D (CITRACAL+D) 315-200 MG-UNIT tablet Take 1 tablet by mouth daily.   Yes [provider]  Cholecalciferol (VITAMIN D3) 50 MCG (2000 UT) TABS Take 2,000 Units by mouth daily. 11/05/19  Yes Birdie Sons, MD  ciprofloxacin (CIPRO) 500 MG tablet Take 1 tablet (500 mg total) by mouth 2 (two) times daily. 04/18/20  Yes Earlie Server, MD  ferrous sulfate 325 (65 FE) MG EC tablet  Take 1 tablet (325 mg total) by mouth 2 (two) times daily with a meal. 04/18/20  Yes Earlie Server, MD  furosemide (LASIX) 20 MG tablet TAKE 1 TABLET BY MOUTH EVERY DAY AS NEEDED 03/04/20  Yes Earlie Server, MD  gabapentin (NEURONTIN) 400 MG capsule TAKE 1 CAPSULE THREE TIMES DAILY 11/08/19  Yes Earlie Server, MD  HYDROcodone-acetaminophen (NORCO) 7.5-325 MG tablet Take 1-2 tablets by mouth every 4 (four) hours as needed for moderate pain. 04/11/20  Yes Borders, Kirt Boys, NP  LORazepam (ATIVAN) 0.5 MG tablet Take 0.5 mg by mouth every 4 (four) hours as needed. 04/11/20  Yes [provider]  magnesium chloride (SLOW-MAG) 64 MG TBEC SR tablet Take 1 tablet (64 mg total) by mouth daily. 01/22/20  Yes Earlie Server, MD  metoprolol succinate (TOPROL-XL) 100 MG 24 hr tablet TAKE 1 TABLET (100 MG TOTAL) BY MOUTH DAILY. TAKE WITH OR IMMEDIATELY FOLLOWING A MEAL. 03/28/20  Yes Birdie Sons, MD  morphine (MS CONTIN) 15 MG 12 hr tablet Take 1 tablet (15 mg total) by mouth every 12 (twelve) hours. 04/24/20  Yes Borders, Kirt Boys, NP  Multiple Vitamins-Minerals (WOMENS MULTI VITAMIN & MINERAL PO)  Take 2 tablets by mouth daily.    Yes [provider]  omeprazole (PRILOSEC) 20 MG capsule Take 1 capsule (20 mg total) by mouth daily. 03/10/19  Yes Birdie Sons, MD  ondansetron (ZOFRAN) 8 MG tablet Take 8 mg by mouth every 8 (eight) hours as needed.  01/28/20  Yes [provider]  pravastatin (PRAVACHOL) 40 MG tablet TAKE 1 TABLET EVERY DAY 05/20/19  Yes Birdie Sons, MD  Probiotic Product (PROBIOTIC DAILY PO) Take 1 capsule by mouth daily.    Yes [provider]  senna-docusate (SENOKOT-S) 8.6-50 MG tablet Take 2 tablets by mouth daily. 12/24/19  Yes Earlie Server, MD  bisacodyl (DULCOLAX) 10 MG suppository Place 1 suppository (10 mg total) rectally daily as needed for moderate constipation. 04/24/20   Borders, Kirt Boys, NP  diphenhydrAMINE (BENADRYL) 25 MG tablet Take 2 tablets (50 mg total) by mouth at  bedtime as needed for itching or allergies. 12/25/19   Lavina Hamman, MD  EPINEPHrine 0.3 mg/0.3 mL IJ SOAJ injection Inject 0.3 mLs (0.3 mg total) into the muscle as needed for anaphylaxis. 12/25/19   Lavina Hamman, MD     ALLERGIES:  Allergies  Allergen Reactions  . Erbitux [Cetuximab] Anaphylaxis  . Betadine [Povidone Iodine] Other (See Comments)    Skin Irritation   . Iodine Other (See Comments)    Skin Irritation   . Other Nausea And Vomiting    Antihystamines  . Sinus Formula [Cholestatin] Nausea Only     SOCIAL HISTORY:  Social History   Socioeconomic History  . Marital status: Divorced    Spouse name: Not on file  . Number of children: 3  . Years of education: Not on file  . Highest education level: 7th grade  Occupational History  . Occupation: retired  Tobacco Use  . Smoking status: Former Smoker    Packs/day: 0.25    Years: 55.00    Pack years: 13.75    Types: Cigarettes    Quit date: 05/21/2017    Years since quitting: 2.9  . Smokeless tobacco: Never Used  . Tobacco comment: started age 47 1/2 to 1 ppd  Vaping Use  . Vaping Use: Former  Substance and Sexual Activity  . Alcohol use: Yes    Alcohol/week: 0.0 standard drinks    Comment: occasionally drinks beer / 1 month  . Drug use: No  . Sexual activity: Never  Other Topics Concern  . Not on file  Social History Narrative   Lives at home alone   Social Determinants of Health   Financial Resource Strain: Low Risk   . Difficulty of Paying Living Expenses: Not hard at all  Food Insecurity: No Food Insecurity  . Worried About Charity fundraiser in the Last Year: Never true  . Ran Out of Food in the Last Year: Never true  Transportation Needs: No Transportation Needs  . Lack of Transportation (Medical): No  . Lack of Transportation (Non-Medical): No  Physical Activity: Inactive  . Days of Exercise per Week: 0 days  . Minutes of Exercise per Session: 0 min  Stress: No Stress Concern Present  .  Feeling of Stress : Not at all  Social Connections: Socially Isolated  . Frequency of Communication with Friends and Family: More than three times a week  . Frequency of Social Gatherings with Friends and Family: Once a week  . Attends Religious Services: Never  . Active Member of Clubs or Organizations: No  . Attends  Club or Organization Meetings: Never  . Marital Status: Divorced  Human resources officer Violence: Not At Risk  . Fear of Current or Ex-Partner: No  . Emotionally Abused: No  . Physically Abused: No  . Sexually Abused: No     FAMILY HISTORY:  Family History  Problem Relation Age of Onset  . Diabetes Mother        type 2  . Coronary artery disease Mother   . Deep vein thrombosis Mother   . Colon cancer Father   . Asthma Brother   . Diabetes Brother        type 2  . Breast cancer Neg Hx       REVIEW OF SYSTEMS:  Review of Systems  Constitutional: Negative for chills and fever.  HENT: Negative for congestion and sore throat.   Respiratory: Negative for cough and shortness of breath.   Cardiovascular: Negative for chest pain and palpitations.  Gastrointestinal: Negative for abdominal pain, blood in stool, constipation, diarrhea, nausea and vomiting.  Genitourinary: Positive for hematuria. Negative for dysuria.  All other systems reviewed and are negative.   VITAL SIGNS:  Temp:  [97.6 F (36.4 C)-98.6 F (37 C)] 98.3 F (36.8 C) (07/31 0813) Pulse Rate:  [56-161] 161 (07/31 1100) Resp:  [13-23] 21 (07/31 1100) BP: (112-142)/(52-79) 112/79 (07/31 1100) SpO2:  [86 %-100 %] 93 % (07/31 1100) FiO2 (%):  [55 %] 55 % (07/30 2120) Weight:  [67.3 kg-68.6 kg] 67.3 kg (07/31 0449)     Height: 5\' 4"  (162.6 cm) Weight: 67.3 kg BMI (Calculated): 25.44   INTAKE/OUTPUT:  07/30 0701 - 07/31 0700 In: 1486 [I.V.:1486] Out: 3000 [Urine:2600; Emesis/NG output:100]  PHYSICAL EXAM:  Physical Exam Vitals and nursing note reviewed.  Constitutional:      General: She is not in  acute distress.    Appearance: Normal appearance. She is obese. She is not ill-appearing.     Comments: She is quite somnolent.  She does arouse with verbal stimulation.  HENT:     Head: Normocephalic and atraumatic.     Nose:     Comments: NGT in place Eyes:     General: No scleral icterus.    Conjunctiva/sclera: Conjunctivae normal.  Cardiovascular:     Rate and Rhythm: Tachycardia present. Rhythm irregular.  Pulmonary:     Effort: Pulmonary effort is normal.     Comments: Currently on 100% nonrebreather facemask.  She has a wet cough. Abdominal:     General: There is no distension.     Palpations: Abdomen is soft.     Tenderness: There is no abdominal tenderness. There is no guarding or rebound.     Comments: Abdomen is soft and nontender.  Multiple previous surgical scars present.  Genitourinary:    Comments: deferred Musculoskeletal:     Right lower leg: No edema.     Left lower leg: No edema.  Skin:    General: Skin is warm and dry.     Coloration: Skin is not pale.     Findings: No erythema.  Neurological:     General: No focal deficit present.     Mental Status: She is alert.  Psychiatric:        Mood and Affect: Mood normal.        Behavior: Behavior normal.      Labs:  CBC Latest Ref Rng & Units 04/29/2020 04/28/2020 04/28/2020  WBC 4.0 - 10.5 K/uL 17.8(H) 15.3(H) 13.9(H)  Hemoglobin 12.0 - 15.0 g/dL 8.2(L) 8.5(L) 8.2(L)  Hematocrit 36 - 46 % 27.2(L) 28.5(L) 27.7(L)  Platelets 150 - 400 K/uL 474(H) 527(H) 548(H)   CMP Latest Ref Rng & Units 04/29/2020 04/28/2020 04/27/2020  Glucose 70 - 99 mg/dL 101(H) 111(H) 118(H)  BUN 8 - 23 mg/dL 15 20 21   Creatinine 0.44 - 1.00 mg/dL 0.64 0.66 0.81  Sodium 135 - 145 mmol/L 131(L) 134(L) 134(L)  Potassium 3.5 - 5.1 mmol/L 3.1(L) 3.7 3.5  Chloride 98 - 111 mmol/L 91(L) 97(L) 96(L)  CO2 22 - 32 mmol/L 25 26 26   Calcium 8.9 - 10.3 mg/dL 7.6(L) 7.8(L) 8.1(L)  Total Protein 6.5 - 8.1 g/dL - - 6.5  Total Bilirubin 0.3 - 1.2  mg/dL - - 0.8  Alkaline Phos 38 - 126 U/L - - 66  AST 15 - 41 U/L - - 12(L)  ALT 0 - 44 U/L - - 8    Imaging studies:   CT Abdomen/pelvis (04/28/2020) personally reviewed showing dilated loops of small bowel consistent with SBO likely attributable to carcinomatosis, and radiologist report reviewed:  IMPRESSION: Findings consistent with a partial small bowel obstruction down to the level of the right lower abdomen where there is a gradual area of narrowing within the distal ileal loops. No definite focal transition point however is noted.   Assessment/Plan: (ICD-10's: K42.609) 74 y.o. female with unfortunate situation, small bowel obstruction attributable to metastatic colon cancer with carcinomatosis, complicated by multiple pertinent comorbidities.   - Unfortunately, in this situation there are no viable surgical options available. Recommend continue conservative management with NGT decompression, NPO, IVF resuscitation, unless patient wishes to return to palliative care-only, at which point the NG tube could be removed.  - Continue to monitor abdominal examination; on-going bowel function  - Can consider morning KUBs as needed  - Recommend re-engage palliative care in house to continue to discuss [place] -vs- hospice  - Further management per primary service; we will sign off but are available for consultation, if needed.

## 2020-04-29 NOTE — Progress Notes (Addendum)
PROGRESS NOTE    Kimberly [name]   NFA:[id number]  DOB: July 06, 1946  PCP: Birdie Sons, MD    DOA: 04/27/2020 LOS: 1   Brief Narrative   Kimberly [name] is a 74 y.o. female with medical history significant for metastatic colon cancer who was on hospice but revoked on 7/29 to pursue palliative radiation therapy, COPD with chronic hypoxic respiratory failure, hypertension, and atrial fibrillation, presented to the ED on 04/28/20 with abdominal pain, nausea, and vomiting which had progressive over a week. Was started on lactulose for constipation as outpatient, but got worse, prompting EMS to be called.  EMS found patient to be in A-fib with RVR (HR 180).  Her anticoagulation was discontinued recently due to hematuria due to tumor invasion of her urinary bladder.  In the ED, O2 sat mid 90's on 2 L/min oxygen, HR 150's, stable BP.  EKG showed A-fib with RVR 159 bpm and repolarization abnormality.  Labs notable for mild hypnatremia, stable chronic leukocytosis, thrombocytosis and improved normocytic anemia.  CT abdomen/pelvis concerning for small bowel obstruction.  Started on Cardizem gtt in the ED.   Admitted to hospitalist service.  General surgery is consulted, recommends conservative management of her SBO, with NG tube for decompression, NPO and IV fluids.  Unfortunately, patient is not a surgical candidate due to her advanced metastatic disease.     Assessment & Plan   Principal Problem:   Atrial fibrillation with RVR (HCC) Active Problems:   SBO (small bowel obstruction) (HCC)   Malignant neoplasm of colon (HCC)   Chronic obstructive pulmonary disease (HCC)   Chronic respiratory failure with hypoxia (HCC)  Sepsis secondary to community-acquired pneumonia -sepsis present on admission with tachycardia, worsened hypoxia, and left lower lobe infiltrate on x-ray of 7/31.  Initially thought to be SIRS at time of admission.  She has EF of 50 to 55% so will defer 30 mL/kg fluid bolus, start  gentle maintenance fluid.  Monitor closely.  Check lactate.  Blood cultures ordered.  Acute on chronic respiratory failure with hypoxia -multifactorial, due to A. fib with RVR and pneumonia.  Continue supplemental oxygen as needed to keep O2 sat 90% or greater, wean as tolerated.  A. fib with RVR -history of paroxysmal A. fib.  Recently taken off Eliquis due to hematuria secondary to metastatic invasion of her bladder.  Cardiology consulted.  On Cardizem drip at max 15 mg/hr.  Heart rate fairly well controlled but was having intermittent spikes into the 160s this morning.  Small bowel obstruction -present on admission with abdominal pain, nausea, vomiting and CT findings showing obstruction.  Secondary to recurrent colon cancer.  General surgery is consulted.  NPO.  NG tube decompression.  Pain control and antiemetics as needed.  Left lower lobe pneumonia - present on admission, seen on chest x-ray ordered early this morning by night provider.  No prior chest imaging this admission.  Started on IV Rocephin and Zithromax.  Incentive spirometer.  Mucinex.  Hypokalemia - K 3.1 this a.m. replacing by IV.  Daily BMP to monitor, check Mg level.  Replace as needed.  Maintain K> 4.0 and Mg> 2.0.  Metastatic colon cancer - please reference most recent palliative notes of 7/30 for details of history.  Patient had been on hospice care but revoked 2 days prior to admission to pursue palliative radiation therapy.  Palliative following.    Hematuria -due to tumor invasion of bladder.  Monitor closely Daily CBC.  SCDs for DVT prophylaxis.  COPD -on  home oxygen.  Not acutely exacerbated, no wheezing on exam.  Treating for pneumonia which could exacerbate so we will monitor closely.  Continue supplemental oxygen, bronchodilators per orders.  Chronic anemia -likely chronic disease.  Stable upon admission.  Monitor CBC.  Monitor for signs of bleeding.  She does have hematuria due to tumor invasion into her bladder.   SCDs for DVT prophylaxis.  Essential hypertension -currently with borderline BP on Cardizem infusion for A. fib RVR.  Home meds on hold while n.p.o.    Patient BMI: Body mass index is 25.46 kg/m.   DVT prophylaxis: SCDs Start: 04/28/20 0350 SCDs Start: 04/28/20 0350   Diet:  Diet Orders (From admission, onward)    Start     Ordered   04/28/20 0350  Diet NPO time specified  Diet effective now        04/28/20 0349            Code Status: DNR    Subjective 04/29/20    Patient seen with daughter at bedside this morning.  Patient denies chest pain.  Says her breathing feels improved.  Says her abdomen is softer, denies abdominal pain or nausea vomiting since NG tube placed.  No fevers or chills.    Disposition Plan & Communication   Status is: Inpatient  Remains inpatient appropriate because:Inpatient level of care appropriate due to severity of illness   Dispo: The patient is from: Home              Anticipated d/c is to: Home              Anticipated d/c date is: > 3 days              Patient currently is not medically stable to d/c.        Family Communication: Daughter at bedside during encounter   Consults, Procedures, Significant Events   Consultants:   Cardiology  Palliative care  Procedures:   None  Antimicrobials:   Rocephin and Zithromax 7/31 >>    Objective   Vitals:   04/28/20 2120 04/29/20 0043 04/29/20 0449 04/29/20 0813  BP:  123/68 (!) 142/61 124/71  Pulse:  97 91 80  Resp:  17 15 15   Temp:  98.6 F (37 C) 97.6 F (36.4 C) 98.3 F (36.8 C)  TempSrc:   Oral Oral  SpO2: (!) 88% 91% 93% 100%  Weight:   67.3 kg   Height:        Intake/Output Summary (Last 24 hours) at 04/29/2020 0845 Last data filed at 04/29/2020 0600 Gross per 24 hour  Intake 1485.99 ml  Output 3000 ml  Net -1514.01 ml   Filed Weights   04/27/20 2356 04/28/20 1635 04/29/20 0449  Weight: 69.9 kg 68.6 kg 67.3 kg    Physical Exam:  General exam:  awake, alert, no acute distress HEENT: NG tube in left nare, dry mucus membranes, hearing grossly normal  Respiratory system: Decreased breath sounds, no wheezes, rales or rhonchi, normal respiratory effort, on 10 L/min supplemental O2 by NRB mask. Cardiovascular system: normal S1/S2, irregularly irregular, no pedal edema.   Gastrointestinal system: soft, NT, ND, +bowel sounds. Central nervous system: A&O x4. no gross focal neurologic deficits, normal speech Extremities: moves all, no cyanosis, normal tone Skin: dry, intact, normal temperature Psychiatry: normal mood, congruent affect, judgement and insight appear normal  Labs   Data Reviewed: I have personally reviewed following labs and imaging studies  CBC: Recent Labs  Lab 04/23/20  0830 04/27/20 2335 04/28/20 0622 04/28/20 2254 04/29/20 0516  WBC 12.6* 14.4* 13.9* 15.3* 17.8*  NEUTROABS  --  11.3*  --   --   --   HGB 8.1* 8.6* 8.2* 8.5* 8.2*  HCT 26.2* 27.3* 27.7* 28.5* 27.2*  MCV 83.7 81.3 83.4 82.8 82.2  PLT 461* 571* 548* 527* 161*   Basic Metabolic Panel: Recent Labs  Lab 04/23/20 0830 04/27/20 2335 04/28/20 0622 04/29/20 0516  NA 136 134* 134* 131*  K 3.9 3.5 3.7 3.1*  CL 99 96* 97* 91*  CO2 27 26 26 25   GLUCOSE 101* 118* 111* 101*  BUN 11 21 20 15   CREATININE 0.74 0.81 0.66 0.64  CALCIUM 8.5* 8.1* 7.8* 7.6*   GFR: Estimated Creatinine Clearance: 59.9 mL/min (by C-G formula based on SCr of 0.64 mg/dL). Liver Function Tests: Recent Labs  Lab 04/23/20 0830 04/27/20 2335  AST 13* 12*  ALT 8 8  ALKPHOS 69 66  BILITOT 0.3 0.8  PROT 6.8 6.5  ALBUMIN 3.1* 3.0*   Recent Labs  Lab 04/23/20 0830  LIPASE 19   No results for input(s): AMMONIA in the last 168 hours. Coagulation Profile: No results for input(s): INR, PROTIME in the last 168 hours. Cardiac Enzymes: No results for input(s): CKTOTAL, CKMB, CKMBINDEX, TROPONINI in the last 168 hours. BNP (last 3 results) No results for input(s): PROBNP in  the last 8760 hours. HbA1C: No results for input(s): HGBA1C in the last 72 hours. CBG: No results for input(s): GLUCAP in the last 168 hours. Lipid Profile: No results for input(s): CHOL, HDL, LDLCALC, TRIG, CHOLHDL, LDLDIRECT in the last 72 hours. Thyroid Function Tests: No results for input(s): TSH, T4TOTAL, FREET4, T3FREE, THYROIDAB in the last 72 hours. Anemia Panel: No results for input(s): VITAMINB12, FOLATE, FERRITIN, TIBC, IRON, RETICCTPCT in the last 72 hours. Sepsis Labs: No results for input(s): PROCALCITON, LATICACIDVEN in the last 168 hours.  Recent Results (from the past 240 hour(s))  SARS Coronavirus 2 by RT PCR (hospital order, performed in [facility] hospital lab) Nasopharyngeal Nasopharyngeal Swab     Status: None   Collection Time: 04/28/20  2:51 AM   Specimen: Nasopharyngeal Swab  Result Value Ref Range Status   SARS Coronavirus 2 NEGATIVE NEGATIVE Final    Comment: (NOTE) SARS-CoV-2 target nucleic acids are NOT DETECTED.  The SARS-CoV-2 RNA is generally detectable in upper and lower respiratory specimens during the acute phase of infection. The lowest concentration of SARS-CoV-2 viral copies this assay can detect is 250 copies / mL. A negative result does not preclude SARS-CoV-2 infection and should not be used as the sole basis for treatment or other patient management decisions.  A negative result may occur with improper specimen collection / handling, submission of specimen other than nasopharyngeal swab, presence of viral mutation(s) within the areas targeted by this assay, and inadequate number of viral copies (<250 copies / mL). A negative result must be combined with clinical observations, patient history, and epidemiological information.  Fact Sheet for Patients:   StrictlyIdeas.no  Fact Sheet for Healthcare Providers: BankingDealers.co.za  This test is not yet approved or  cleared by the [place] FDA and has been authorized for detection and/or diagnosis of SARS-CoV-2 by FDA under an Emergency Use Authorization (EUA).  This EUA will remain in effect (meaning this test can be used) for the duration of the COVID-19 declaration under Section 564(b)(1) of the Act, 21 U.S.C. section 360bbb-3(b)(1), unless the authorization is terminated or revoked sooner.  Performed at [facility], [address] [place], [place] [address]       Imaging Studies   CT ABDOMEN PELVIS WO CONTRAST  Result Date: 04/28/2020 CLINICAL DATA:  Nonlocalized abdominal pain with nausea EXAM: CT ABDOMEN AND PELVIS WITHOUT CONTRAST TECHNIQUE: Multidetector CT imaging of the abdomen and pelvis was performed following the standard protocol without IV contrast. COMPARISON:  None. FINDINGS: Lower chest: The visualized heart size within normal limits. No pericardial fluid/thickening. No hiatal hernia. Patchy airspace opacities seen at the posterior left lung base. Hepatobiliary: Although limited due to the lack of intravenous contrast, normal in appearance without gross focal abnormality. No evidence of calcified gallstones or biliary ductal dilatation. Pancreas:  Unremarkable.  No surrounding inflammatory changes. Spleen: Normal in size. Although limited due to the lack of intravenous contrast, normal in appearance. Adrenals/Urinary Tract: Both adrenal glands appear normal. The kidneys and collecting system appear normal without evidence of urinary tract calculus or hydronephrosis. Bladder is unremarkable. Stomach/Bowel: There is a mildly dilated fluid and food stuff filled stomach. Dilated air and fluid-filled loops small bowel are seen throughout the mid abdomen measuring up to 4.5 cm down to the level of the distal ileum within the right lower quadrant where there is a gradual area of narrowing which is not well seen. The distal ileal loops and terminal ileum are decompressed. There is been a prior partial  right colectomy with surgical sutures noted. A moderate amount of colonic stool is seen throughout. Scattered colonic diverticula are seen. Vascular/Lymphatic: There are no enlarged abdominal or pelvic lymph nodes. Scattered aortic atherosclerotic calcifications are seen without aneurysmal dilatation. Reproductive: The deep pelvis is unremarkable. Other: No evidence of abdominal wall mass or hernia. Musculoskeletal: No acute or significant osseous findings. IMPRESSION: Findings consistent with a partial small bowel obstruction down to the level of the right lower abdomen where there is a gradual area of narrowing within the distal ileal loops. No definite focal transition point however is noted. Aortic Atherosclerosis (ICD10-I70.0). Electronically Signed   By: Prudencio Pair M.D.   On: 04/28/2020 01:08   DG Abdomen Acute W/Chest  Result Date: 04/27/2020 CLINICAL DATA:  74 year old female with abdominal pain, nausea, atrial fibrillation with RVR. EXAM: DG ABDOMEN ACUTE W/ 1V CHEST COMPARISON:  CT Abdomen and Pelvis 04/19/2020 and earlier. FINDINGS: Portable AP upright view at 2336 hours. Increased elevation of the left hemidiaphragm from the CT last week. Normal cardiac size and mediastinal contours. Visualized tracheal air column is within normal limits. Increased pulmonary interstitial markings appear stable. Right chest Port-A-Cath which is accessed. No pneumothorax. No definite pleural effusion. Upright and supine views of the abdomen and pelvis. Increased gastric and small bowel air since the prior CT, including dilated 4 cm left lower quadrant small bowel loops. Previous partial colectomy, but little to no gas is identified in the residual large bowel. Superimposed mesenteric masses demonstrated by CT. No pneumoperitoneum identified. Stable visualized osseous structures. IMPRESSION: 1. Appearance suspicious for new small-bowel obstruction since the CT 04/19/2020, perhaps secondary to the mesenteric mass seen  at that time. 2. No pneumoperitoneum identified. 3. New elevation of the left hemidiaphragm probably related to #1. No definite pleural fluid. Electronically Signed   By: Genevie Ann M.D.   On: 04/27/2020 23:56     Medications   Scheduled Meds: . Chlorhexidine Gluconate Cloth  6 each Topical Daily  . fluticasone  2 spray Each Nare Daily  . mometasone-formoterol  2 puff Inhalation BID  . sodium chloride flush  3 mL Intravenous Q12H   Continuous Infusions: . diltiazem (CARDIZEM) infusion 15 mg/hr (04/29/20 0823)  . potassium chloride 10 mEq (04/29/20 0826)       LOS: 1 day    Time spent: 48 minutes with greater than 50% spent in coordination of care and direct patient contact.    Ezekiel Slocumb, DO [facility]  04/29/2020, 8:45 AM    If 7PM-7AM, please contact night-coverage. How to contact the [facility] Attending or Consulting provider [place] or covering provider during after hours [place], for this patient?    1. Check the care team in [facility] and look for a) attending/consulting [facility] provider listed and b) the [facility] team listed 2. Log into [email or web address] and use [place]'s universal password to access. If you do not have the password, please contact the hospital operator. 3. Locate the [facility] provider you are looking for under [facility] and page to a number that you can be directly reached. 4. If you still have difficulty reaching the provider, please page the [facility] (Director on Call) for the Hospitalists listed on amion for assistance.

## 2020-04-29 NOTE — Consult Note (Signed)
[facility] Consultation Note  Patient ID: Kimberly [name], MRN: [id number], DOB/AGE: April 09, 1946 74 y.o. Admit date: 04/27/2020   Date of Consult: 04/29/2020 Primary Physician: Birdie Sons, MD Primary Cardiologist: Nehemiah Massed  Chief Complaint:  Chief Complaint  Patient presents with  . Nausea  . Atrial Fibrillation  . Tachycardia   Reason for Consult: Atrial fibrillation with rapid ventricular rate  HPI: 74 y.o. female with known colon cancer with metastases hypertension hyperlipidemia anemia and paroxysmal nonvalvular atrial fibrillation previously treated with appropriate medication management including anticoagulation and beta-blocker but now having a significant hospitalization with abdominal discomfort nausea vomiting and small bowel obstruction with electrolyte abnormalities.  EKG has currently shown atrial fibrillation with rapid ventricular rate for which the patient is not having any significant symptoms.  There is no evidence of angina congestive heart failure or acute coronary syndrome.  Additionally the patient has a CT scan of the abdomen showing small bowel obstruction and a chest x-ray showing left lower lobe pneumonia but no evidence of heart failure.  The patient was placed on diltiazem drip due to inability to add oral medications for which the patient has now had a spontaneous conversion to normal sinus rhythm and is improved.  There has been some discussion about other medication management including the possibility of amiodarone drip if necessary although with long discussion with the family it does appear that the patient and the family are okay with diltiazem drip at this time until improvements occur with her current significant infections and issues.  Past Medical History:  Diagnosis Date  . Breast cancer, left ([place]) 2004   Lumpectomy and rad tx's.  . Chronic back pain   . Colon cancer [place]) 2018  . COPD (chronic obstructive pulmonary disease) ([place])    . Degenerative disc disease, lumbar 04/2013  . Family history of adverse reaction to anesthesia    son arrested after anesthesia  . GERD (gastroesophageal reflux disease)   . Hypertension   . Iron deficiency anemia 05/27/2017  . Personal history of chemotherapy 2018   Colon  . Personal history of radiation therapy    Breast 2004  . Personal history of radiation therapy 2018   Colon  . Postprocedural intraabdominal abscess 09/01/2018  . Small bowel obstruction ([place]) 04/16/2017  . Wears dentures    full upper      Surgical History:  Past Surgical History:  Procedure Laterality Date  . APPENDECTOMY  1965  . BREAST EXCISIONAL BIOPSY Left 08/01/2003   lumpectomy rad 11/04-2/28/2005  . BREAST SURGERY    . CESAREAN SECTION     x3  . COLON SURGERY    . COLONOSCOPY W/ POLYPECTOMY    . COLONOSCOPY WITH PROPOFOL N/A 04/09/2018   Procedure: COLONOSCOPY WITH PROPOFOL;  Surgeon: Virgel Manifold, MD;  Location: [facility];  Service: Gastroenterology;  Laterality: N/A;  . COLONOSCOPY WITH PROPOFOL N/A 08/13/2019   Procedure: COLONOSCOPY WITH PROPOFOL;  Surgeon: Virgel Manifold, MD;  Location: [facility];  Service: Endoscopy;  Laterality: N/A;  . COLOSTOMY TAKEDOWN N/A 06/25/2018   Procedure: COLOSTOMY TAKEDOWN;  Surgeon: Jules Husbands, MD;  Location: [facility];  Service: General;  Laterality: N/A;  . ESOPHAGOGASTRODUODENOSCOPY (EGD) WITH PROPOFOL N/A 04/04/2017   Procedure: ESOPHAGOGASTRODUODENOSCOPY (EGD) WITH PROPOFOL;  Surgeon: [name], MD;  Location: [facility];  Service: Endoscopy;  Laterality: N/A;  . FRACTURE SURGERY    . HIP FRACTURE SURGERY Left 01/25/2012  . LAPAROSCOPY N/A 12/07/2019   Procedure: LAPAROSCOPY DIAGNOSTIC;  Surgeon: Jules Husbands, MD;  Location: [facility];  Service: General;  Laterality: N/A;  . LAPAROTOMY N/A 04/15/2017   Procedure: EXPLORATORY LAPAROTOMY;  Surgeon: Clayburn Pert, MD;  Location: [facility];  Service: General;   Laterality: N/A;  . NECK SURGERY  12/2011  . PARASTOMAL HERNIA REPAIR N/A 12/03/2017   Procedure: HERNIA REPAIR PARASTOMAL;  Surgeon: Jules Husbands, MD;  Location: [facility];  Service: General;  Laterality: N/A;  . PARASTOMAL HERNIA REPAIR N/A 06/25/2018   Procedure: HERNIA REPAIR PARASTOMAL;  Surgeon: Jules Husbands, MD;  Location: [facility];  Service: General;  Laterality: N/A;  . PORTACATH PLACEMENT Right 05/21/2017   Procedure: INSERTION PORT-A-CATH;  Surgeon: Clayburn Pert, MD;  Location: [facility];  Service: General;  Laterality: Right;  . PORTACATH PLACEMENT Right 12/07/2019   Procedure: INSERTION PORT-A-CATH;  Surgeon: Jules Husbands, MD;  Location: [facility];  Service: General;  Laterality: Right;  . WRIST FRACTURE SURGERY       Home Meds: Prior to Admission medications   Medication Sig Start Date End Date Taking? Authorizing Provider  apixaban (ELIQUIS) 5 MG TABS tablet Take 1 tablet (5 mg total) by mouth 2 (two) times daily. 01/18/20  Yes Birdie Sons, MD  budesonide-formoterol [facility]) 80-4.5 MCG/ACT inhaler Inhale 2 puffs into the lungs 2 (two) times daily. 12/25/19  Yes Lavina Hamman, MD  calcium citrate-vitamin D (CITRACAL+D) 315-200 MG-UNIT tablet Take 1 tablet by mouth daily.   Yes [provider]  Cholecalciferol (VITAMIN D3) 50 MCG (2000 UT) TABS Take 2,000 Units by mouth daily. 11/05/19  Yes Birdie Sons, MD  ciprofloxacin (CIPRO) 500 MG tablet Take 1 tablet (500 mg total) by mouth 2 (two) times daily. 04/18/20  Yes Earlie Server, MD  ferrous sulfate 325 (65 FE) MG EC tablet Take 1 tablet (325 mg total) by mouth 2 (two) times daily with a meal. 04/18/20  Yes Earlie Server, MD  furosemide (LASIX) 20 MG tablet TAKE 1 TABLET BY MOUTH EVERY DAY AS NEEDED 03/04/20  Yes Earlie Server, MD  gabapentin (NEURONTIN) 400 MG capsule TAKE 1 CAPSULE THREE TIMES DAILY 11/08/19  Yes Earlie Server, MD  HYDROcodone-acetaminophen (NORCO) 7.5-325 MG tablet Take 1-2 tablets by mouth every 4 (four) hours as  needed for moderate pain. 04/11/20  Yes Borders, Kirt Boys, NP  LORazepam (ATIVAN) 0.5 MG tablet Take 0.5 mg by mouth every 4 (four) hours as needed. 04/11/20  Yes [provider]  magnesium chloride (SLOW-MAG) 64 MG TBEC SR tablet Take 1 tablet (64 mg total) by mouth daily. 01/22/20  Yes Earlie Server, MD  metoprolol succinate (TOPROL-XL) 100 MG 24 hr tablet TAKE 1 TABLET (100 MG TOTAL) BY MOUTH DAILY. TAKE WITH OR IMMEDIATELY FOLLOWING A MEAL. 03/28/20  Yes Birdie Sons, MD  morphine (MS CONTIN) 15 MG 12 hr tablet Take 1 tablet (15 mg total) by mouth every 12 (twelve) hours. 04/24/20  Yes Borders, Kirt Boys, NP  Multiple Vitamins-Minerals (WOMENS MULTI VITAMIN & MINERAL PO) Take 2 tablets by mouth daily.    Yes [provider]  omeprazole (PRILOSEC) 20 MG capsule Take 1 capsule (20 mg total) by mouth daily. 03/10/19  Yes Birdie Sons, MD  ondansetron (ZOFRAN) 8 MG tablet Take 8 mg by mouth every 8 (eight) hours as needed.  01/28/20  Yes [provider]  pravastatin (PRAVACHOL) 40 MG tablet TAKE 1 TABLET EVERY DAY 05/20/19  Yes Birdie Sons, MD  Probiotic Product (PROBIOTIC DAILY PO) Take 1 capsule  by mouth daily.    Yes [provider]  senna-docusate (SENOKOT-S) 8.6-50 MG tablet Take 2 tablets by mouth daily. 12/24/19  Yes Earlie Server, MD  bisacodyl (DULCOLAX) 10 MG suppository Place 1 suppository (10 mg total) rectally daily as needed for moderate constipation. 04/24/20   Borders, Kirt Boys, NP  diphenhydrAMINE (BENADRYL) 25 MG tablet Take 2 tablets (50 mg total) by mouth at bedtime as needed for itching or allergies. 12/25/19   Lavina Hamman, MD  EPINEPHrine 0.3 mg/0.3 mL IJ SOAJ injection Inject 0.3 mLs (0.3 mg total) into the muscle as needed for anaphylaxis. 12/25/19   Lavina Hamman, MD    Inpatient Medications:  . Chlorhexidine Gluconate Cloth  6 each Topical Daily  . fluticasone  2 spray Each Nare Daily  . mometasone-formoterol  2 puff Inhalation BID  .  sodium chloride flush  3 mL Intravenous Q12H   . azithromycin 500 mg (04/29/20 1533)  . cefTRIAXone (ROCEPHIN)  IV 1 g (04/29/20 1454)  . diltiazem (CARDIZEM) infusion 15 mg/hr (04/29/20 3559)    Allergies:  Allergies  Allergen Reactions  . Erbitux [Cetuximab] Anaphylaxis  . Betadine [Povidone Iodine] Other (See Comments)    Skin Irritation   . Iodine Other (See Comments)    Skin Irritation   . Other Nausea And Vomiting    Antihystamines  . Sinus Formula [Cholestatin] Nausea Only    Social History   Socioeconomic History  . Marital status: Divorced    Spouse name: Not on file  . Number of children: 3  . Years of education: Not on file  . Highest education level: 7th grade  Occupational History  . Occupation: retired  Tobacco Use  . Smoking status: Former Smoker    Packs/day: 0.25    Years: 55.00    Pack years: 13.75    Types: Cigarettes    Quit date: 05/21/2017    Years since quitting: 2.9  . Smokeless tobacco: Never Used  . Tobacco comment: started age 74 1/2 to 1 ppd  Vaping Use  . Vaping Use: Former  Substance and Sexual Activity  . Alcohol use: Yes    Alcohol/week: 0.0 standard drinks    Comment: occasionally drinks beer / 1 month  . Drug use: No  . Sexual activity: Never  Other Topics Concern  . Not on file  Social History Narrative   Lives at home alone   Social Determinants of Health   Financial Resource Strain: Low Risk   . Difficulty of Paying Living Expenses: Not hard at all  Food Insecurity: No Food Insecurity  . Worried About Charity fundraiser in the Last Year: Never true  . Ran Out of Food in the Last Year: Never true  Transportation Needs: No Transportation Needs  . Lack of Transportation (Medical): No  . Lack of Transportation (Non-Medical): No  Physical Activity: Inactive  . Days of Exercise per Week: 0 days  . Minutes of Exercise per Session: 0 min  Stress: No Stress Concern Present  . Feeling of Stress : Not at all  Social  Connections: Socially Isolated  . Frequency of Communication with Friends and Family: More than three times a week  . Frequency of Social Gatherings with Friends and Family: Once a week  . Attends Religious Services: Never  . Active Member of Clubs or Organizations: No  . Attends Archivist Meetings: Never  . Marital Status: Divorced  Human resources officer Violence: Not At Risk  . Fear of Current  or Ex-Partner: No  . Emotionally Abused: No  . Physically Abused: No  . Sexually Abused: No     Family History  Problem Relation Age of Onset  . Diabetes Mother        type 2  . Coronary artery disease Mother   . Deep vein thrombosis Mother   . Colon cancer Father   . Asthma Brother   . Diabetes Brother        type 2  . Breast cancer Neg Hx      Review of Systems Positive for abdominal discomfort nausea vomiting Negative for: General:  chills, fever, night sweats or weight changes.  Cardiovascular: PND orthopnea syncope dizziness  Dermatological skin lesions rashes Respiratory: Cough congestion Urologic: Frequent urination urination at night and hematuria Abdominal: Positive for nausea, vomiting, diarrhea, negative for bright red blood per rectum, melena, or hematemesis Neurologic: negative for visual changes, and/or hearing changes  All other systems reviewed and are otherwise negative except as noted above.  Labs: No results for input(s): CKTOTAL, CKMB, TROPONINI in the last 72 hours. Lab Results  Component Value Date   WBC 17.8 (H) 04/29/2020   HGB 8.2 (L) 04/29/2020   HCT 27.2 (L) 04/29/2020   MCV 82.2 04/29/2020   PLT 474 (H) 04/29/2020    Recent Labs  Lab 04/27/20 2335 04/28/20 0622 04/29/20 0516  NA 134*   < > 131*  K 3.5   < > 3.1*  CL 96*   < > 91*  CO2 26   < > 25  BUN 21   < > 15  CREATININE 0.81   < > 0.64  CALCIUM 8.1*   < > 7.6*  PROT 6.5  --   --   BILITOT 0.8  --   --   ALKPHOS 66  --   --   ALT 8  --   --   AST 12*  --   --   GLUCOSE  118*   < > 101*   < > = values in this interval not displayed.   Lab Results  Component Value Date   CHOL 155 11/03/2019   HDL 43 11/03/2019   LDLCALC 76 11/03/2019   TRIG 216 (H) 11/03/2019   No results found for: DDIMER  Radiology/Studies:  CT Abdomen Pelvis W Wo Contrast  Result Date: 04/19/2020 CLINICAL DATA:  Painless hematuria for 5 days. History of colon cancer with colostomy and reversal. EXAM: CT ABDOMEN AND PELVIS WITHOUT AND WITH CONTRAST TECHNIQUE: Multidetector CT imaging of the abdomen and pelvis was performed following the standard protocol before and following the bolus administration of intravenous contrast. CONTRAST:  19mL OMNIPAQUE IOHEXOL 300 MG/ML  SOLN COMPARISON:  Abdominopelvic CT 12/08/2019. FINDINGS: Lower chest: Stable linear scarring in the right lower lobe and mild chronic central airway thickening at both lung bases. Hepatobiliary: The liver is normal in density without suspicious focal abnormality. No evidence of gallstones, gallbladder wall thickening or biliary dilatation. Pancreas: Unremarkable. No pancreatic ductal dilatation or surrounding inflammatory changes. Spleen: Normal in size without focal abnormality. Adrenals/Urinary Tract: There are stable low-density adrenal masses consistent with adenomas. These measure up to 2.7 cm on the right and 5.3 cm on the left. Pre-contrast images demonstrate no renal, ureteral or bladder calculi. Post-contrast, both kidneys enhance normally. There is no evidence of enhancing renal mass. Delayed images result in segmental visualization of the ureters. No focal upper tract urothelial abnormalities are identified. New abnormal tenting of the bladder superiorly on the right  with an adjacent ill-defined low-density mass with peripheral enhancement measuring up to 4.8 x 3.5 cm on image 61/5. This is inferior to the ileocolonic anastomosis and is worrisome for local recurrence of colon cancer with central necrosis. This mass may  invade the bladder. There is mild bladder wall thickening without other focal bladder abnormality. Stomach/Bowel: Stable mild diffuse gastric wall thickening. No small bowel distension or surrounding inflammation. Status post right hemicolectomy with ileocolonic anastomosis. There is an additional anastomosis in the distal transverse colon. No evidence of bowel obstruction. Vascular/Lymphatic: As above, new centrally necrotic mass in the right lower quadrant worrisome for local recurrence of colon cancer or metastatic adenopathy. There are no enlarged retroperitoneal lymph nodes. Diffuse aortic and branch vessel atherosclerosis. The portal, superior mesenteric and splenic veins are patent. Reproductive: Peripherally enhancing lesion along the anterior uterine fundus measures up to 2.9 x 2.1 cm on image 59/11. This could reflect a uterine fibroid, although is not seen on previous studies, and could reflect a metastasis given the other findings. No adnexal mass. Other: In addition to the right lower quadrant centrally necrotic lesion described above, there is a small spiculated lesion posterior to the ileocolonic anastomosis measuring 12 mm on image 50/5. There is increased nodularity in the right anterior abdominal wall measuring 3.2 x 2.3 cm on image 39/5. No ascites or generalized peritoneal nodularity. Musculoskeletal: No acute or significant osseous findings. Previous left hip ORIF. IMPRESSION: 1. New centrally necrotic mass in the right lower quadrant with abnormal tenting of the bladder and possible bladder invasion, worrisome for local recurrence of colon cancer or metastatic adenopathy. At least one other mildly enlarged lymph node is noted. 2. Increased nodularity in the right anterior abdominal wall, suspicious for metastatic disease. 3. Peripherally enhancing lesion in the anterior uterine fundus suspicious for metastatic disease given nonvisualization on previous imaging. 4. No evidence of urinary tract  calculus, renal mass or hydronephrosis. 5. Stable bilateral adrenal adenomas. 6. Aortic Atherosclerosis (ICD10-I70.0). 7. These results will be called to the ordering clinician or representative by the Radiologist Assistant, and communication documented in the PACS or [employer]. Electronically Signed   By: Richardean Sale M.D.   On: 04/19/2020 16:16   CT ABDOMEN PELVIS WO CONTRAST  Result Date: 04/28/2020 CLINICAL DATA:  Nonlocalized abdominal pain with nausea EXAM: CT ABDOMEN AND PELVIS WITHOUT CONTRAST TECHNIQUE: Multidetector CT imaging of the abdomen and pelvis was performed following the standard protocol without IV contrast. COMPARISON:  None. FINDINGS: Lower chest: The visualized heart size within normal limits. No pericardial fluid/thickening. No hiatal hernia. Patchy airspace opacities seen at the posterior left lung base. Hepatobiliary: Although limited due to the lack of intravenous contrast, normal in appearance without gross focal abnormality. No evidence of calcified gallstones or biliary ductal dilatation. Pancreas:  Unremarkable.  No surrounding inflammatory changes. Spleen: Normal in size. Although limited due to the lack of intravenous contrast, normal in appearance. Adrenals/Urinary Tract: Both adrenal glands appear normal. The kidneys and collecting system appear normal without evidence of urinary tract calculus or hydronephrosis. Bladder is unremarkable. Stomach/Bowel: There is a mildly dilated fluid and food stuff filled stomach. Dilated air and fluid-filled loops small bowel are seen throughout the mid abdomen measuring up to 4.5 cm down to the level of the distal ileum within the right lower quadrant where there is a gradual area of narrowing which is not well seen. The distal ileal loops and terminal ileum are decompressed. There is been a prior partial right colectomy with surgical  sutures noted. A moderate amount of colonic stool is seen throughout. Scattered colonic diverticula  are seen. Vascular/Lymphatic: There are no enlarged abdominal or pelvic lymph nodes. Scattered aortic atherosclerotic calcifications are seen without aneurysmal dilatation. Reproductive: The deep pelvis is unremarkable. Other: No evidence of abdominal wall mass or hernia. Musculoskeletal: No acute or significant osseous findings. IMPRESSION: Findings consistent with a partial small bowel obstruction down to the level of the right lower abdomen where there is a gradual area of narrowing within the distal ileal loops. No definite focal transition point however is noted. Aortic Atherosclerosis (ICD10-I70.0). Electronically Signed   By: Prudencio Pair M.D.   On: 04/28/2020 01:08   DG Chest 1 View  Result Date: 04/29/2020 CLINICAL DATA:  74 year old currently being treated for metastatic colon cancer, presenting with hypoxia. Former smoker with COPD. EXAM: PORTABLE CHEST 1 VIEW COMPARISON:  01/10/2020. FINDINGS: RIGHT jugular Port-A-Cath tip projects over the LOWER SVC, unchanged. Nasogastric tube tip projects over the fundus of the stomach. Cardiac silhouette normal in size, unchanged. Airspace consolidation involving the LEFT LOWER LOBE, silhouetting the LEFT hemidiaphragm. Lungs otherwise clear. Emphysematous changes throughout both lungs, unchanged. Probable small to moderate-sized LEFT pleural effusion. No visible RIGHT pleural effusion. IMPRESSION: 1. Acute LEFT LOWER LOBE pneumonia. 2. Probable small to moderate-sized LEFT pleural effusion. Electronically Signed   By: Evangeline Dakin M.D.   On: 04/29/2020 09:03   DG Abdomen Acute W/Chest  Result Date: 04/27/2020 CLINICAL DATA:  74 year old female with abdominal pain, nausea, atrial fibrillation with RVR. EXAM: DG ABDOMEN ACUTE W/ 1V CHEST COMPARISON:  CT Abdomen and Pelvis 04/19/2020 and earlier. FINDINGS: Portable AP upright view at 2336 hours. Increased elevation of the left hemidiaphragm from the CT last week. Normal cardiac size and mediastinal  contours. Visualized tracheal air column is within normal limits. Increased pulmonary interstitial markings appear stable. Right chest Port-A-Cath which is accessed. No pneumothorax. No definite pleural effusion. Upright and supine views of the abdomen and pelvis. Increased gastric and small bowel air since the prior CT, including dilated 4 cm left lower quadrant small bowel loops. Previous partial colectomy, but little to no gas is identified in the residual large bowel. Superimposed mesenteric masses demonstrated by CT. No pneumoperitoneum identified. Stable visualized osseous structures. IMPRESSION: 1. Appearance suspicious for new small-bowel obstruction since the CT 04/19/2020, perhaps secondary to the mesenteric mass seen at that time. 2. No pneumoperitoneum identified. 3. New elevation of the left hemidiaphragm probably related to #1. No definite pleural fluid. Electronically Signed   By: Genevie Ann M.D.   On: 04/27/2020 23:56    EKG: Atrial fibrillation with rapid ventricular rate with nonspecific ST changes Current telemetry shows normal sinus rhythm  Weights: Filed Weights   04/27/20 2356 04/28/20 1635 04/29/20 0449  Weight: 69.9 kg 68.6 kg 67.3 kg     Physical Exam: Blood pressure 112/79, pulse (!) 161, temperature 98.3 F (36.8 C), temperature source Oral, resp. rate 21, height 5\' 4"  (1.626 m), weight 67.3 kg, SpO2 93 %. Body mass index is 25.46 kg/m. General: Well developed, well nourished, in no acute distress. Head eyes ears nose throat: Normocephalic, atraumatic, sclera non-icteric, no xanthomas, nares are without discharge. No apparent thyromegaly and/or mass  Lungs: Normal respiratory effort.  Few wheezes, no rales, some rhonchi.  Heart: RRR with normal S1 S2. no murmur gallop, no rub, PMI is normal size and placement, carotid upstroke normal without bruit, jugular venous pressure is normal Abdomen: Soft, -tender,  distended with normoactive bowel sounds. No  hepatomegaly. No  rebound/guarding. No obvious abdominal masses. Abdominal aorta is normal size without bruit Extremities: Trace edema. no cyanosis, no clubbing, no ulcers  Peripheral : 2+ bilateral upper extremity pulses, 2+ bilateral femoral pulses, 2+ bilateral dorsal pedal pulse Neuro: Alert and oriented. No facial asymmetry. No focal deficit. Moves all extremities spontaneously. Musculoskeletal: Normal muscle tone without kyphosis Psych:  Responds to questions appropriately with a normal affect.    Assessment: 74 year old female with colon cancer hypertension hyperlipidemia and now small bowel obstruction and left lower lobe pneumonia likely causing significant total body stress and increasing risk of atrial fibrillation with rapid ventricular rate relatively stable now with diltiazem drip without current evidence of acute coronary syndrome symptoms chest pain or congestive heart failure   Plan: 1.  Continue diltiazem drip due to inability to effectively use oral medication management due to small bowel obstruction and will continue at highest dose at 15 mg/h 2.  Long discussion with the family about other options if necessary for atrial fibrillation heart rate control including amiodarone drip for which we have decided not to add at this point but will use if necessary.  Although currently the patient has tolerated rapid heart rate without any problems of acute coronary syndrome chest pain or congestive heart failure 3.  Further consideration of deep venous thrombosis prophylaxis if not contraindicated 4.  Additional treatment options when able when patient has improvements of left lower lobe pneumonia and bowel obstruction small  Signed, Corey Skains M.D. [facility] 04/29/2020, 4:08 PM

## 2020-04-29 NOTE — Progress Notes (Addendum)
Pt resting in bed, on NRB mask, HR 140-160's sustaining, pt already max on Cardizem gtt. Dr. Jacqulyn Liner on the floor and made aware.

## 2020-04-30 ENCOUNTER — Inpatient Hospital Stay: Payer: Medicare Other

## 2020-04-30 DIAGNOSIS — K56609 Unspecified intestinal obstruction, unspecified as to partial versus complete obstruction: Secondary | ICD-10-CM

## 2020-04-30 DIAGNOSIS — J9611 Chronic respiratory failure with hypoxia: Secondary | ICD-10-CM

## 2020-04-30 DIAGNOSIS — C189 Malignant neoplasm of colon, unspecified: Secondary | ICD-10-CM

## 2020-04-30 LAB — EXPECTORATED SPUTUM ASSESSMENT W GRAM STAIN, RFLX TO RESP C

## 2020-04-30 LAB — BASIC METABOLIC PANEL
Anion gap: 11 (ref 5–15)
BUN: 15 mg/dL (ref 8–23)
CO2: 27 mmol/L (ref 22–32)
Calcium: 7.9 mg/dL — ABNORMAL LOW (ref 8.9–10.3)
Chloride: 97 mmol/L — ABNORMAL LOW (ref 98–111)
Creatinine, Ser: 0.56 mg/dL (ref 0.44–1.00)
GFR calc Af Amer: >60 mL/min (ref >60)
GFR calc non Af Amer: >60 mL/min (ref >60)
Glucose, Bld: 93 mg/dL (ref 70–99)
Potassium: 3.7 mmol/L (ref 3.5–5.1)
Sodium: 135 mmol/L (ref 135–145)

## 2020-04-30 LAB — CBC
HCT: 27.1 % — ABNORMAL LOW (ref 36.0–46.0)
Hemoglobin: 8.1 g/dL — ABNORMAL LOW (ref 12.0–15.0)
MCH: 24.5 pg — ABNORMAL LOW (ref 26.0–34.0)
MCHC: 29.9 g/dL — ABNORMAL LOW (ref 30.0–36.0)
MCV: 82.1 fL (ref 80.0–100.0)
Platelets: 459 10*3/uL — ABNORMAL HIGH (ref 150–400)
RBC: 3.3 MIL/uL — ABNORMAL LOW (ref 3.87–5.11)
RDW: 17.1 % — ABNORMAL HIGH (ref 11.5–15.5)
WBC: 18.2 10*3/uL — ABNORMAL HIGH (ref 4.0–10.5)
nRBC: 0.2 % (ref 0.0–0.2)

## 2020-04-30 LAB — MAGNESIUM: Magnesium: 1.9 mg/dL (ref 1.7–2.4)

## 2020-04-30 MED ORDER — PHENOL 1.4 % MT LIQD
1.0000 | OROMUCOSAL | Status: DC | PRN
  Filled 2020-04-30: qty 177

## 2020-04-30 NOTE — Progress Notes (Signed)
PROGRESS NOTE    Kimberly [name]   XYV:[id number]  DOB: 1946/07/14  PCP: Birdie Sons, MD    DOA: 04/27/2020 LOS: 2   Brief Narrative   Kimberly [name] is a 74 y.o. female with medical history significant for metastatic colon cancer who was on hospice but revoked on 7/29 to pursue palliative radiation therapy, COPD with chronic hypoxic respiratory failure, hypertension, and atrial fibrillation, presented to the ED on 04/28/20 with abdominal pain, nausea, and vomiting which had progressive over a week. Was started on lactulose for constipation as outpatient, but got worse, prompting EMS to be called.  EMS found patient to be in A-fib with RVR (HR 180).  Her anticoagulation was discontinued recently due to hematuria due to tumor invasion of her urinary bladder.  In the ED, O2 sat mid 90's on 2 L/min oxygen, HR 150's, stable BP.  EKG showed A-fib with RVR 159 bpm and repolarization abnormality.  Labs notable for mild hypnatremia, stable chronic leukocytosis, thrombocytosis and improved normocytic anemia.  CT abdomen/pelvis concerning for small bowel obstruction.  Started on Cardizem gtt in the ED.   Admitted to hospitalist service.  General surgery is consulted, recommends conservative management of her SBO, with NG tube for decompression, NPO and IV fluids.  Unfortunately, patient is not a surgical candidate due to her advanced metastatic disease.     Assessment & Plan   Principal Problem:   Atrial fibrillation with RVR (HCC) Active Problems:   SBO (small bowel obstruction) (HCC)   Malignant neoplasm of colon (HCC)   Chronic obstructive pulmonary disease (HCC)   Chronic respiratory failure with hypoxia (HCC)   Sepsis secondary to community-acquired pneumonia -sepsis present on admission with tachycardia, worsened hypoxia, and left lower lobe infiltrate on x-ray of 7/31.  Initially thought to be SIRS at time of admission.  She has EF of 50 to 55% so will defer 30 mL/kg fluid bolus, now on  gentle maintenance fluid.  Monitor closely.  Lactic acidosis resolved.   Continue Rocephin and Zithromax.  Blood cultures pending.  Sputum culture, strep and Legionella urinary antigens.  Mucinex scheduled.  Acute on chronic respiratory failure with hypoxia -multifactorial, due to A. fib with RVR and pneumonia.  Continue supplemental oxygen as needed to keep O2 sat 90% or greater, wean as tolerated.  Still on 10 L/min NRB mask this morning  A. fib with RVR -history of paroxysmal A. fib.  Recently taken off Eliquis due to hematuria secondary to metastatic invasion of her bladder.  Cardiology consulted.  On Cardizem drip at max 15 mg/hr.  Heart rate fairly well controlled but was having intermittent spikes into the 160s this morning.  Small bowel obstruction -present on admission with abdominal pain, nausea, vomiting and CT findings showing obstruction.  Secondary to recurrent colon cancer.  General surgery is consulted.  NPO.  NG tube decompression.  Pain control and antiemetics as needed.  KUB ordered.  Is passing gas.  Left lower lobe pneumonia - present on admission, seen on chest x-ray ordered early this morning by night provider.  No prior chest imaging this admission.  Started on IV Rocephin and Zithromax.  Incentive spirometer.  Mucinex.  Hypokalemia - K 3.1 this a.m. replacing by IV.  Daily BMP to monitor, check Mg level.  Replace as needed.  Maintain K> 4.0 and Mg> 2.0.  Metastatic colon cancer - please reference most recent palliative notes of 7/30 for details of history.  Patient had been on hospice care but revoked  2 days prior to admission to pursue palliative radiation therapy.  Palliative following.    Hematuria -due to tumor invasion of bladder.  Monitor closely Daily CBC.  SCDs for DVT prophylaxis.  COPD -on home oxygen.  Not acutely exacerbated, no wheezing on exam.  Treating for pneumonia which could exacerbate so we will monitor closely.  Continue supplemental oxygen,  bronchodilators per orders.  Chronic anemia -likely chronic disease.  Stable upon admission.  Monitor CBC.  Monitor for signs of bleeding.  She does have hematuria due to tumor invasion into her bladder.  SCDs for DVT prophylaxis.  Essential hypertension -currently with borderline BP on Cardizem infusion for A. fib RVR.  Home meds on hold while n.p.o.    Patient BMI: Body mass index is 25.46 kg/m.   DVT prophylaxis: SCDs Start: 04/28/20 0350 SCDs Start: 04/28/20 0350   Diet:  Diet Orders (From admission, onward)    Start     Ordered   04/28/20 0350  Diet NPO time specified  Diet effective now        04/28/20 0349            Code Status: DNR    Subjective 04/30/20    Patient seen with daughter at bedside this morning.  Reports she feels okay.  Has had some cough and sputum production recently prior to admission.  She denies chest pain or feeling short of breath.  Also denies fevers or chills, nausea vomiting or abdominal pain.  Has started to pass some gas.  No other acute complaints.  Disposition Plan & Communication   Status is: Inpatient  Remains inpatient appropriate because:Inpatient level of care appropriate due to severity of illness   Dispo: The patient is from: Home              Anticipated d/c is to: Home              Anticipated d/c date is: > 3 days              Patient currently is not medically stable to d/c.        Family Communication: Daughter at bedside during encounter   Consults, Procedures, Significant Events   Consultants:   Cardiology  Palliative care  Procedures:   None  Antimicrobials:   Rocephin and Zithromax 7/31 >>    Objective   Vitals:   04/29/20 1400 04/29/20 1500 04/29/20 1600 04/29/20 2015  BP: 108/80 123/67 (!) 97/47   Pulse: 95 84 82   Resp: 16 (!) 27 17   Temp:    97.8 F (36.6 C)  TempSrc:    Oral  SpO2: 99% 99% 96%   Weight:      Height:        Intake/Output Summary (Last 24 hours) at 04/30/2020  0818 Last data filed at 04/30/2020 0600 Gross per 24 hour  Intake 810.37 ml  Output 1400 ml  Net -589.63 ml   Filed Weights   04/27/20 2356 04/28/20 1635 04/29/20 0449  Weight: 69.9 kg 68.6 kg 67.3 kg    Physical Exam:  General exam: awake, alert, no acute distress HEENT: NG tube in left nare, dry mucus membranes, hearing grossly normal, wearing NRB mask Respiratory system: Left-sided rhonchi, right side is clear, normal respiratory effort, on 10 L/min supplemental O2 by NRB mask. Cardiovascular system: normal S1/S2, irregular rhythm, regular rate, no pedal edema.   Gastrointestinal system: soft, NT, ND, +bowel sounds. Genitourinary -Foley in place, red hematuria in bag Skin:  dry, intact, normal temperature Psychiatry: normal mood, congruent affect, judgement and insight appear normal  Labs   Data Reviewed: I have personally reviewed following labs and imaging studies  CBC: Recent Labs  Lab 04/27/20 2335 04/28/20 0622 04/28/20 2254 04/29/20 0516 04/30/20 0526  WBC 14.4* 13.9* 15.3* 17.8* 18.2*  NEUTROABS 11.3*  --   --   --   --   HGB 8.6* 8.2* 8.5* 8.2* 8.1*  HCT 27.3* 27.7* 28.5* 27.2* 27.1*  MCV 81.3 83.4 82.8 82.2 82.1  PLT 571* 548* 527* 474* 053*   Basic Metabolic Panel: Recent Labs  Lab 04/23/20 0830 04/27/20 2335 04/28/20 0622 04/29/20 0516 04/30/20 0526  NA 136 134* 134* 131* 135  K 3.9 3.5 3.7 3.1* 3.7  CL 99 96* 97* 91* 97*  CO2 27 26 26 25 27   GLUCOSE 101* 118* 111* 101* 93  BUN 11 21 20 15 15   CREATININE 0.74 0.81 0.66 0.64 0.56  CALCIUM 8.5* 8.1* 7.8* 7.6* 7.9*  MG  --   --   --   --  1.9   GFR: Estimated Creatinine Clearance: 59.9 mL/min (by C-G formula based on SCr of 0.56 mg/dL). Liver Function Tests: Recent Labs  Lab 04/23/20 0830 04/27/20 2335  AST 13* 12*  ALT 8 8  ALKPHOS 69 66  BILITOT 0.3 0.8  PROT 6.8 6.5  ALBUMIN 3.1* 3.0*   Recent Labs  Lab 04/23/20 0830  LIPASE 19   No results for input(s): AMMONIA in the last 168  hours. Coagulation Profile: No results for input(s): INR, PROTIME in the last 168 hours. Cardiac Enzymes: No results for input(s): CKTOTAL, CKMB, CKMBINDEX, TROPONINI in the last 168 hours. BNP (last 3 results) No results for input(s): PROBNP in the last 8760 hours. HbA1C: No results for input(s): HGBA1C in the last 72 hours. CBG: No results for input(s): GLUCAP in the last 168 hours. Lipid Profile: No results for input(s): CHOL, HDL, LDLCALC, TRIG, CHOLHDL, LDLDIRECT in the last 72 hours. Thyroid Function Tests: No results for input(s): TSH, T4TOTAL, FREET4, T3FREE, THYROIDAB in the last 72 hours. Anemia Panel: No results for input(s): VITAMINB12, FOLATE, FERRITIN, TIBC, IRON, RETICCTPCT in the last 72 hours. Sepsis Labs: Recent Labs  Lab 04/29/20 2039  LATICACIDVEN 0.8    Recent Results (from the past 240 hour(s))  SARS Coronavirus 2 by RT PCR (hospital order, performed in [facility] hospital lab) Nasopharyngeal Nasopharyngeal Swab     Status: None   Collection Time: 04/28/20  2:51 AM   Specimen: Nasopharyngeal Swab  Result Value Ref Range Status   SARS Coronavirus 2 NEGATIVE NEGATIVE Final    Comment: (NOTE) SARS-CoV-2 target nucleic acids are NOT DETECTED.  The SARS-CoV-2 RNA is generally detectable in upper and lower respiratory specimens during the acute phase of infection. The lowest concentration of SARS-CoV-2 viral copies this assay can detect is 250 copies / mL. A negative result does not preclude SARS-CoV-2 infection and should not be used as the sole basis for treatment or other patient management decisions.  A negative result may occur with improper specimen collection / handling, submission of specimen other than nasopharyngeal swab, presence of viral mutation(s) within the areas targeted by this assay, and inadequate number of viral copies (<250 copies / mL). A negative result must be combined with clinical observations, patient history, and epidemiological  information.  Fact Sheet for Patients:   StrictlyIdeas.no  Fact Sheet for Healthcare Providers: BankingDealers.co.za  This test is not yet approved or  cleared by  the [employer] and has been authorized for detection and/or diagnosis of SARS-CoV-2 by FDA under an Emergency Use Authorization (EUA).  This EUA will remain in effect (meaning this test can be used) for the duration of the COVID-19 declaration under Section 564(b)(1) of the Act, 21 U.S.C. section 360bbb-3(b)(1), unless the authorization is terminated or revoked sooner.  Performed at [facility], [address] [place], [place] [address]   CULTURE, BLOOD (ROUTINE X 2) w Reflex to ID Panel     Status: None (Preliminary result)   Collection Time: 04/29/20  8:39 PM   Specimen: BLOOD  Result Value Ref Range Status   Specimen Description BLOOD BLOOD RIGHT HAND  Final   Special Requests   Final    BOTTLES DRAWN AEROBIC ONLY Blood Culture results may not be optimal due to an inadequate volume of blood received in culture bottles   Culture   Final    NO GROWTH < 12 HOURS Performed at [facility], [address], [place], [place] [address]    Report Status PENDING  Incomplete  CULTURE, BLOOD (ROUTINE X 2) w Reflex to ID Panel     Status: None (Preliminary result)   Collection Time: 04/29/20  8:57 PM   Specimen: BLOOD  Result Value Ref Range Status   Specimen Description BLOOD BLOOD LEFT HAND  Final   Special Requests   Final    BOTTLES DRAWN AEROBIC AND ANAEROBIC Blood Culture adequate volume   Culture   Final    NO GROWTH < 12 HOURS Performed at [facility], [address], [place], [place] [address]    Report Status PENDING  Incomplete      Imaging Studies   DG Chest 1 View  Result Date: 04/29/2020 CLINICAL DATA:  74 year old currently being treated for metastatic colon cancer, presenting with hypoxia. Former smoker with COPD.  EXAM: PORTABLE CHEST 1 VIEW COMPARISON:  01/10/2020. FINDINGS: RIGHT jugular Port-A-Cath tip projects over the LOWER SVC, unchanged. Nasogastric tube tip projects over the fundus of the stomach. Cardiac silhouette normal in size, unchanged. Airspace consolidation involving the LEFT LOWER LOBE, silhouetting the LEFT hemidiaphragm. Lungs otherwise clear. Emphysematous changes throughout both lungs, unchanged. Probable small to moderate-sized LEFT pleural effusion. No visible RIGHT pleural effusion. IMPRESSION: 1. Acute LEFT LOWER LOBE pneumonia. 2. Probable small to moderate-sized LEFT pleural effusion. Electronically Signed   By: Evangeline Dakin M.D.   On: 04/29/2020 09:03     Medications   Scheduled Meds: . Chlorhexidine Gluconate Cloth  6 each Topical Daily  . dextromethorphan-guaiFENesin  1 tablet Oral BID  . fluticasone  2 spray Each Nare Daily  . mometasone-formoterol  2 puff Inhalation BID  . sodium chloride flush  3 mL Intravenous Q12H   Continuous Infusions: . sodium chloride 75 mL/hr at 04/29/20 2221  . azithromycin 500 mg (04/29/20 1533)  . cefTRIAXone (ROCEPHIN)  IV 1 g (04/29/20 1454)  . diltiazem (CARDIZEM) infusion 15 mg/hr (04/30/20 0325)       LOS: 2 days    Time spent: 30 minutes    Ezekiel Slocumb, DO [facility]  04/30/2020, 8:18 AM    If 7PM-7AM, please contact night-coverage. How to contact the [facility] Attending or Consulting provider [place] or covering provider during after hours [place], for this patient?    1. Check the care team in [facility] and look for a) attending/consulting [facility] provider listed and b) the [facility] team listed 2. Log into [email or web address] and use  [place]'s universal password to access. If you do not have the password, please contact the hospital operator. 3. Locate the [facility] provider you are looking for under [facility] and page to a number that you can be directly reached. 4. If you still have difficulty reaching the provider, please  page the [facility] (Director on Call) for the Hospitalists listed on amion for assistance.

## 2020-04-30 NOTE — Progress Notes (Signed)
[facility] Cardiology [facility] Encounter Note  Patient: Kimberly [name] / Admit Date: 04/27/2020 / Date of Encounter: 04/30/2020, 6:25 AM   Subjective: Overall patient does feel slightly better with less abdominal discomfort and slight amount of passing gas.  The patient has not had any significant chest discomfort shortness of breath cough or congestion today.  Heart rate is still well controlled and in normal rhythm with diltiazem drip for which she has had reasonable blood pressure control as well.  No current need for additional treatment with amiodarone drip while small bowel obstruction and pneumonia is slowly improving  Review of Systems: Positive for: Weakness Negative for: Vision change, hearing change, syncope, dizziness, nausea, vomiting,diarrhea, bloody stool, stomach pain, cough, congestion, diaphoresis, urinary frequency, urinary pain,skin lesions, skin rashes Others previously listed  Objective: Telemetry: Normal sinus rhythm Physical Exam: Blood pressure (!) 97/47, pulse 82, temperature 97.8 F (36.6 C), temperature source Oral, resp. rate 17, height 5\' 4"  (1.626 m), weight 67.3 kg, SpO2 96 %. Body mass index is 25.46 kg/m. General: Well developed, well nourished, in no acute distress. Head: Normocephalic, atraumatic, sclera non-icteric, no xanthomas, nares are without discharge. Neck: No apparent masses Lungs: Normal respirations with no wheezes, few rhonchi, no rales , no crackles   Heart: Regular rate and rhythm, normal S1 S2, no murmur, no rub, no gallop, PMI is normal size and placement, carotid upstroke normal without bruit, jugular venous pressure normal Abdomen: Soft, tender,  distended with abnormal bowel sounds. No hepatosplenomegaly. Abdominal aorta is normal size without bruit Extremities: Trace edema, no clubbing, no cyanosis, no ulcers,  Peripheral: 2+ radial, 2+ femoral, 2+ dorsal pedal pulses Neuro: Alert and oriented. Moves all extremities  spontaneously. Psych:  Responds to questions appropriately with a normal affect.   Intake/Output Summary (Last 24 hours) at 04/30/2020 0625 Last data filed at 04/30/2020 0400 Gross per 24 hour  Intake 795.57 ml  Output 900 ml  Net -104.43 ml    Inpatient Medications:  . Chlorhexidine Gluconate Cloth  6 each Topical Daily  . dextromethorphan-guaiFENesin  1 tablet Oral BID  . fluticasone  2 spray Each Nare Daily  . mometasone-formoterol  2 puff Inhalation BID  . sodium chloride flush  3 mL Intravenous Q12H   Infusions:  . sodium chloride 75 mL/hr at 04/29/20 2221  . azithromycin 500 mg (04/29/20 1533)  . cefTRIAXone (ROCEPHIN)  IV 1 g (04/29/20 1454)  . diltiazem (CARDIZEM) infusion 15 mg/hr (04/30/20 0325)    Labs: Recent Labs    04/29/20 0516 04/30/20 0526  NA 131* 135  K 3.1* 3.7  CL 91* 97*  CO2 25 27  GLUCOSE 101* 93  BUN 15 15  CREATININE 0.64 0.56  CALCIUM 7.6* 7.9*  MG  --  1.9   Recent Labs    04/27/20 2335  AST 12*  ALT 8  ALKPHOS 66  BILITOT 0.8  PROT 6.5  ALBUMIN 3.0*   Recent Labs    04/27/20 2335 04/28/20 0622 04/29/20 0516 04/30/20 0526  WBC 14.4*   < > 17.8* 18.2*  NEUTROABS 11.3*  --   --   --   HGB 8.6*   < > 8.2* 8.1*  HCT 27.3*   < > 27.2* 27.1*  MCV 81.3   < > 82.2 82.1  PLT 571*   < > 474* 459*   < > = values in this interval not displayed.   No results for input(s): CKTOTAL, CKMB, TROPONINI in the last 72 hours. Invalid input(s): POCBNP  No results for input(s): HGBA1C in the last 72 hours.   Weights: Filed Weights   04/27/20 2356 04/28/20 1635 04/29/20 0449  Weight: 69.9 kg 68.6 kg 67.3 kg     Radiology/Studies:  CT Abdomen Pelvis W Wo Contrast  Result Date: 04/19/2020 CLINICAL DATA:  Painless hematuria for 5 days. History of colon cancer with colostomy and reversal. EXAM: CT ABDOMEN AND PELVIS WITHOUT AND WITH CONTRAST TECHNIQUE: Multidetector CT imaging of the abdomen and pelvis was performed following the standard  protocol before and following the bolus administration of intravenous contrast. CONTRAST:  116mL OMNIPAQUE IOHEXOL 300 MG/ML  SOLN COMPARISON:  Abdominopelvic CT 12/08/2019. FINDINGS: Lower chest: Stable linear scarring in the right lower lobe and mild chronic central airway thickening at both lung bases. Hepatobiliary: The liver is normal in density without suspicious focal abnormality. No evidence of gallstones, gallbladder wall thickening or biliary dilatation. Pancreas: Unremarkable. No pancreatic ductal dilatation or surrounding inflammatory changes. Spleen: Normal in size without focal abnormality. Adrenals/Urinary Tract: There are stable low-density adrenal masses consistent with adenomas. These measure up to 2.7 cm on the right and 5.3 cm on the left. Pre-contrast images demonstrate no renal, ureteral or bladder calculi. Post-contrast, both kidneys enhance normally. There is no evidence of enhancing renal mass. Delayed images result in segmental visualization of the ureters. No focal upper tract urothelial abnormalities are identified. New abnormal tenting of the bladder superiorly on the right with an adjacent ill-defined low-density mass with peripheral enhancement measuring up to 4.8 x 3.5 cm on image 61/5. This is inferior to the ileocolonic anastomosis and is worrisome for local recurrence of colon cancer with central necrosis. This mass may invade the bladder. There is mild bladder wall thickening without other focal bladder abnormality. Stomach/Bowel: Stable mild diffuse gastric wall thickening. No small bowel distension or surrounding inflammation. Status post right hemicolectomy with ileocolonic anastomosis. There is an additional anastomosis in the distal transverse colon. No evidence of bowel obstruction. Vascular/Lymphatic: As above, new centrally necrotic mass in the right lower quadrant worrisome for local recurrence of colon cancer or metastatic adenopathy. There are no enlarged  retroperitoneal lymph nodes. Diffuse aortic and branch vessel atherosclerosis. The portal, superior mesenteric and splenic veins are patent. Reproductive: Peripherally enhancing lesion along the anterior uterine fundus measures up to 2.9 x 2.1 cm on image 59/11. This could reflect a uterine fibroid, although is not seen on previous studies, and could reflect a metastasis given the other findings. No adnexal mass. Other: In addition to the right lower quadrant centrally necrotic lesion described above, there is a small spiculated lesion posterior to the ileocolonic anastomosis measuring 12 mm on image 50/5. There is increased nodularity in the right anterior abdominal wall measuring 3.2 x 2.3 cm on image 39/5. No ascites or generalized peritoneal nodularity. Musculoskeletal: No acute or significant osseous findings. Previous left hip ORIF. IMPRESSION: 1. New centrally necrotic mass in the right lower quadrant with abnormal tenting of the bladder and possible bladder invasion, worrisome for local recurrence of colon cancer or metastatic adenopathy. At least one other mildly enlarged lymph node is noted. 2. Increased nodularity in the right anterior abdominal wall, suspicious for metastatic disease. 3. Peripherally enhancing lesion in the anterior uterine fundus suspicious for metastatic disease given nonvisualization on previous imaging. 4. No evidence of urinary tract calculus, renal mass or hydronephrosis. 5. Stable bilateral adrenal adenomas. 6. Aortic Atherosclerosis (ICD10-I70.0). 7. These results will be called to the ordering clinician or representative by the Radiologist Assistant, and communication  documented in the PACS or [employer]. Electronically Signed   By: Richardean Sale M.D.   On: 04/19/2020 16:16   CT ABDOMEN PELVIS WO CONTRAST  Result Date: 04/28/2020 CLINICAL DATA:  Nonlocalized abdominal pain with nausea EXAM: CT ABDOMEN AND PELVIS WITHOUT CONTRAST TECHNIQUE: Multidetector CT imaging of  the abdomen and pelvis was performed following the standard protocol without IV contrast. COMPARISON:  None. FINDINGS: Lower chest: The visualized heart size within normal limits. No pericardial fluid/thickening. No hiatal hernia. Patchy airspace opacities seen at the posterior left lung base. Hepatobiliary: Although limited due to the lack of intravenous contrast, normal in appearance without gross focal abnormality. No evidence of calcified gallstones or biliary ductal dilatation. Pancreas:  Unremarkable.  No surrounding inflammatory changes. Spleen: Normal in size. Although limited due to the lack of intravenous contrast, normal in appearance. Adrenals/Urinary Tract: Both adrenal glands appear normal. The kidneys and collecting system appear normal without evidence of urinary tract calculus or hydronephrosis. Bladder is unremarkable. Stomach/Bowel: There is a mildly dilated fluid and food stuff filled stomach. Dilated air and fluid-filled loops small bowel are seen throughout the mid abdomen measuring up to 4.5 cm down to the level of the distal ileum within the right lower quadrant where there is a gradual area of narrowing which is not well seen. The distal ileal loops and terminal ileum are decompressed. There is been a prior partial right colectomy with surgical sutures noted. A moderate amount of colonic stool is seen throughout. Scattered colonic diverticula are seen. Vascular/Lymphatic: There are no enlarged abdominal or pelvic lymph nodes. Scattered aortic atherosclerotic calcifications are seen without aneurysmal dilatation. Reproductive: The deep pelvis is unremarkable. Other: No evidence of abdominal wall mass or hernia. Musculoskeletal: No acute or significant osseous findings. IMPRESSION: Findings consistent with a partial small bowel obstruction down to the level of the right lower abdomen where there is a gradual area of narrowing within the distal ileal loops. No definite focal transition point  however is noted. Aortic Atherosclerosis (ICD10-I70.0). Electronically Signed   By: Prudencio Pair M.D.   On: 04/28/2020 01:08   DG Chest 1 View  Result Date: 04/29/2020 CLINICAL DATA:  74 year old currently being treated for metastatic colon cancer, presenting with hypoxia. Former smoker with COPD. EXAM: PORTABLE CHEST 1 VIEW COMPARISON:  01/10/2020. FINDINGS: RIGHT jugular Port-A-Cath tip projects over the LOWER SVC, unchanged. Nasogastric tube tip projects over the fundus of the stomach. Cardiac silhouette normal in size, unchanged. Airspace consolidation involving the LEFT LOWER LOBE, silhouetting the LEFT hemidiaphragm. Lungs otherwise clear. Emphysematous changes throughout both lungs, unchanged. Probable small to moderate-sized LEFT pleural effusion. No visible RIGHT pleural effusion. IMPRESSION: 1. Acute LEFT LOWER LOBE pneumonia. 2. Probable small to moderate-sized LEFT pleural effusion. Electronically Signed   By: Evangeline Dakin M.D.   On: 04/29/2020 09:03   DG Abdomen Acute W/Chest  Result Date: 04/27/2020 CLINICAL DATA:  74 year old female with abdominal pain, nausea, atrial fibrillation with RVR. EXAM: DG ABDOMEN ACUTE W/ 1V CHEST COMPARISON:  CT Abdomen and Pelvis 04/19/2020 and earlier. FINDINGS: Portable AP upright view at 2336 hours. Increased elevation of the left hemidiaphragm from the CT last week. Normal cardiac size and mediastinal contours. Visualized tracheal air column is within normal limits. Increased pulmonary interstitial markings appear stable. Right chest Port-A-Cath which is accessed. No pneumothorax. No definite pleural effusion. Upright and supine views of the abdomen and pelvis. Increased gastric and small bowel air since the prior CT, including dilated 4 cm left lower quadrant small  bowel loops. Previous partial colectomy, but little to no gas is identified in the residual large bowel. Superimposed mesenteric masses demonstrated by CT. No pneumoperitoneum identified.  Stable visualized osseous structures. IMPRESSION: 1. Appearance suspicious for new small-bowel obstruction since the CT 04/19/2020, perhaps secondary to the mesenteric mass seen at that time. 2. No pneumoperitoneum identified. 3. New elevation of the left hemidiaphragm probably related to #1. No definite pleural fluid. Electronically Signed   By: Genevie Ann M.D.   On: 04/27/2020 23:56     Assessment and Recommendation  74 y.o. female with known hypertension hyperlipidemia colon cancer with paroxysmal nonvalvular atrial fibrillation with rapid ventricular rate likely secondary to small bowel obstruction and left lower lobe pneumonia now well controlled on intravenous diltiazem remaining in normal sinus rhythm well improving from current illness and no evidence of acute angina acute coronary syndrome or congestive heart failure 1.  Continue diltiazem drip for maintenance of normal sinus rhythm at current dose without change 2.  Consideration of amiodarone drip if necessary if patient has significant progression of atrial fibrillation with rapid ventricular rate not responding to above 3.  No additional anticoagulation at this time other than deep venous thromboses prophylaxis and/or intravenous heparin due to concerns the patient may require surgical intervention 4.  No further cardiac diagnostics necessary at this time 5.  Continue full supportive care for small bowel obstruction and left lower lobe pneumonia  Signed, Serafina Royals M.D. FACC

## 2020-04-30 NOTE — Progress Notes (Signed)
[place] paged for rapid response in pt.'s rm.  When [facility] arrived, RN said RR cancelled --> pt. had removed her O2 mask and sats dropped, but she is now stable.  [facility] remains available as needed.

## 2020-04-30 NOTE — Plan of Care (Signed)
  Problem: Education: Goal: Knowledge of General Education information will improve Description Including pain rating scale, medication(s)/side effects and non-pharmacologic comfort measures Outcome: Progressing   

## 2020-05-01 LAB — BASIC METABOLIC PANEL
Anion gap: 7 (ref 5–15)
BUN: 9 mg/dL (ref 8–23)
CO2: 27 mmol/L (ref 22–32)
Calcium: 7.8 mg/dL — ABNORMAL LOW (ref 8.9–10.3)
Chloride: 100 mmol/L (ref 98–111)
Creatinine, Ser: 0.42 mg/dL — ABNORMAL LOW (ref 0.44–1.00)
GFR calc Af Amer: >60 mL/min (ref >60)
GFR calc non Af Amer: >60 mL/min (ref >60)
Glucose, Bld: 84 mg/dL (ref 70–99)
Potassium: 3.4 mmol/L — ABNORMAL LOW (ref 3.5–5.1)
Sodium: 134 mmol/L — ABNORMAL LOW (ref 135–145)

## 2020-05-01 LAB — CBC
HCT: 25.5 % — ABNORMAL LOW (ref 36.0–46.0)
Hemoglobin: 7.5 g/dL — ABNORMAL LOW (ref 12.0–15.0)
MCH: 24.2 pg — ABNORMAL LOW (ref 26.0–34.0)
MCHC: 29.4 g/dL — ABNORMAL LOW (ref 30.0–36.0)
MCV: 82.3 fL (ref 80.0–100.0)
Platelets: 444 10*3/uL — ABNORMAL HIGH (ref 150–400)
RBC: 3.1 MIL/uL — ABNORMAL LOW (ref 3.87–5.11)
RDW: 17.3 % — ABNORMAL HIGH (ref 11.5–15.5)
WBC: 15.1 10*3/uL — ABNORMAL HIGH (ref 4.0–10.5)
nRBC: 0 % (ref 0.0–0.2)

## 2020-05-01 LAB — MAGNESIUM: Magnesium: 1.9 mg/dL (ref 1.7–2.4)

## 2020-05-01 LAB — STREP PNEUMONIAE URINARY ANTIGEN: Strep Pneumo Urinary Antigen: NEGATIVE

## 2020-05-01 MED ORDER — MORPHINE SULFATE (PF) 2 MG/ML IV SOLN
2.0000 mg | INTRAVENOUS | Status: DC | PRN
  Administered 2020-05-01 – 2020-05-04 (×17): 2 mg via INTRAVENOUS
  Filled 2020-05-01 (×17): qty 1

## 2020-05-01 MED ORDER — POTASSIUM CHLORIDE 10 MEQ/100ML IV SOLN
10.0000 meq | INTRAVENOUS | Status: AC
  Administered 2020-05-01 (×4): 10 meq via INTRAVENOUS
  Filled 2020-05-01 (×4): qty 100

## 2020-05-01 MED ORDER — SODIUM CHLORIDE 0.9% FLUSH: 10.0000 mL | INTRAVENOUS | Status: DC | PRN

## 2020-05-01 MED ORDER — SODIUM CHLORIDE 0.9% FLUSH
10.0000 mL | Freq: Two times a day (BID) | INTRAVENOUS | Status: DC
  Administered 2020-05-03 (×2): 10 mL

## 2020-05-01 MED ORDER — POTASSIUM CHLORIDE IN NACL 20-0.9 MEQ/L-% IV SOLN
INTRAVENOUS | Status: DC
  Filled 2020-05-01 (×7): qty 1000

## 2020-05-01 NOTE — Progress Notes (Signed)
Spoke with RN about decreasing oxygen today. Patient still desaturating quickly when mask is moved just to cough up sputum.  RN states they might be removing NG today, she will call if that occurs so we can try to place patient back on HFNC.

## 2020-05-01 NOTE — Progress Notes (Signed)
[facility] Encounter Note  Patient: Kimberly [name] / Admit Date: 04/27/2020 / Date of Encounter: 05/01/2020, 12:54 PM   Subjective: Overall patient does feel slightly better with less abdominal discomfort and slight amount of passing gas and no significant abdominal bloating at this time.  The patient has not had any significant chest discomfort shortness of breath cough or congestion today.  Heart rate is still well controlled and now with atrial flutter with controlled ventricular rate with diltiazem drip for which she has had reasonable blood pressure control as well.  No current need for additional treatment with amiodarone drip while small bowel obstruction and pneumonia is slowly improving  Review of Systems: Positive for: Weakness Negative for: Vision change, hearing change, syncope, dizziness, nausea, vomiting,diarrhea, bloody stool, stomach pain, cough, congestion, diaphoresis, urinary frequency, urinary pain,skin lesions, skin rashes Others previously listed  Objective: Telemetry: Atrial flutter with controlled ventricular rate Physical Exam: Blood pressure (!) 119/56, pulse 83, temperature 98.3 F (36.8 C), temperature source Oral, resp. rate 16, height 5\' 4"  (1.626 m), weight 67.3 kg, SpO2 99 %. Body mass index is 25.46 kg/m. General: Well developed, well nourished, in no acute distress. Head: Normocephalic, atraumatic, sclera non-icteric, no xanthomas, nares are without discharge. Neck: No apparent masses Lungs: Normal respirations with no wheezes, few rhonchi, no rales , no crackles   Heart: Regular rate and rhythm, normal S1 S2, no murmur, no rub, no gallop, PMI is normal size and placement, carotid upstroke normal without bruit, jugular venous pressure normal Abdomen: Soft, tender,  distended with abnormal bowel sounds. No hepatosplenomegaly. Abdominal aorta is normal size without bruit Extremities: Trace edema, no clubbing, no cyanosis, no ulcers,   Peripheral: 2+ radial, 2+ femoral, 2+ dorsal pedal pulses Neuro: Alert and oriented. Moves all extremities spontaneously. Psych:  Responds to questions appropriately with a normal affect.   Intake/Output Summary (Last 24 hours) at 05/01/2020 1254 Last data filed at 05/01/2020 0731 Gross per 24 hour  Intake 2559.91 ml  Output 1800 ml  Net 759.91 ml    Inpatient Medications:  . Chlorhexidine Gluconate Cloth  6 each Topical Daily  . dextromethorphan-guaiFENesin  1 tablet Oral BID  . fluticasone  2 spray Each Nare Daily  . mometasone-formoterol  2 puff Inhalation BID  . sodium chloride flush  10-40 mL Intracatheter Q12H  . sodium chloride flush  3 mL Intravenous Q12H   Infusions:  . sodium chloride 75 mL/hr at 05/01/20 0257  . azithromycin 500 mg (04/30/20 1240)  . cefTRIAXone (ROCEPHIN)  IV 1 g (04/30/20 1239)  . diltiazem (CARDIZEM) infusion 15 mg/hr (05/01/20 0259)    Labs: Recent Labs    04/30/20 0526 05/01/20 0405  NA 135 134*  K 3.7 3.4*  CL 97* 100  CO2 27 27  GLUCOSE 93 84  BUN 15 9  CREATININE 0.56 0.42*  CALCIUM 7.9* 7.8*  MG 1.9 1.9   No results for input(s): AST, ALT, ALKPHOS, BILITOT, PROT, ALBUMIN in the last 72 hours. Recent Labs    04/30/20 0526 05/01/20 0405  WBC 18.2* 15.1*  HGB 8.1* 7.5*  HCT 27.1* 25.5*  MCV 82.1 82.3  PLT 459* 444*   No results for input(s): CKTOTAL, CKMB, TROPONINI in the last 72 hours. Invalid input(s): POCBNP No results for input(s): HGBA1C in the last 72 hours.   Weights: Filed Weights   04/27/20 2356 04/28/20 1635 04/29/20 0449  Weight: 69.9 kg 68.6 kg 67.3 kg     Radiology/Studies:  CT Abdomen Pelvis  W Wo Contrast  Result Date: 04/19/2020 CLINICAL DATA:  Painless hematuria for 5 days. History of colon cancer with colostomy and reversal. EXAM: CT ABDOMEN AND PELVIS WITHOUT AND WITH CONTRAST TECHNIQUE: Multidetector CT imaging of the abdomen and pelvis was performed following the standard protocol before and  following the bolus administration of intravenous contrast. CONTRAST:  191mL OMNIPAQUE IOHEXOL 300 MG/ML  SOLN COMPARISON:  Abdominopelvic CT 12/08/2019. FINDINGS: Lower chest: Stable linear scarring in the right lower lobe and mild chronic central airway thickening at both lung bases. Hepatobiliary: The liver is normal in density without suspicious focal abnormality. No evidence of gallstones, gallbladder wall thickening or biliary dilatation. Pancreas: Unremarkable. No pancreatic ductal dilatation or surrounding inflammatory changes. Spleen: Normal in size without focal abnormality. Adrenals/Urinary Tract: There are stable low-density adrenal masses consistent with adenomas. These measure up to 2.7 cm on the right and 5.3 cm on the left. Pre-contrast images demonstrate no renal, ureteral or bladder calculi. Post-contrast, both kidneys enhance normally. There is no evidence of enhancing renal mass. Delayed images result in segmental visualization of the ureters. No focal upper tract urothelial abnormalities are identified. New abnormal tenting of the bladder superiorly on the right with an adjacent ill-defined low-density mass with peripheral enhancement measuring up to 4.8 x 3.5 cm on image 61/5. This is inferior to the ileocolonic anastomosis and is worrisome for local recurrence of colon cancer with central necrosis. This mass may invade the bladder. There is mild bladder wall thickening without other focal bladder abnormality. Stomach/Bowel: Stable mild diffuse gastric wall thickening. No small bowel distension or surrounding inflammation. Status post right hemicolectomy with ileocolonic anastomosis. There is an additional anastomosis in the distal transverse colon. No evidence of bowel obstruction. Vascular/Lymphatic: As above, new centrally necrotic mass in the right lower quadrant worrisome for local recurrence of colon cancer or metastatic adenopathy. There are no enlarged retroperitoneal lymph nodes.  Diffuse aortic and branch vessel atherosclerosis. The portal, superior mesenteric and splenic veins are patent. Reproductive: Peripherally enhancing lesion along the anterior uterine fundus measures up to 2.9 x 2.1 cm on image 59/11. This could reflect a uterine fibroid, although is not seen on previous studies, and could reflect a metastasis given the other findings. No adnexal mass. Other: In addition to the right lower quadrant centrally necrotic lesion described above, there is a small spiculated lesion posterior to the ileocolonic anastomosis measuring 12 mm on image 50/5. There is increased nodularity in the right anterior abdominal wall measuring 3.2 x 2.3 cm on image 39/5. No ascites or generalized peritoneal nodularity. Musculoskeletal: No acute or significant osseous findings. Previous left hip ORIF. IMPRESSION: 1. New centrally necrotic mass in the right lower quadrant with abnormal tenting of the bladder and possible bladder invasion, worrisome for local recurrence of colon cancer or metastatic adenopathy. At least one other mildly enlarged lymph node is noted. 2. Increased nodularity in the right anterior abdominal wall, suspicious for metastatic disease. 3. Peripherally enhancing lesion in the anterior uterine fundus suspicious for metastatic disease given nonvisualization on previous imaging. 4. No evidence of urinary tract calculus, renal mass or hydronephrosis. 5. Stable bilateral adrenal adenomas. 6. Aortic Atherosclerosis (ICD10-I70.0). 7. These results will be called to the ordering clinician or representative by the Radiologist Assistant, and communication documented in the PACS or [employer]. Electronically Signed   By: Richardean Sale M.D.   On: 04/19/2020 16:16   CT ABDOMEN PELVIS WO CONTRAST  Result Date: 04/28/2020 CLINICAL DATA:  Nonlocalized abdominal pain with nausea  EXAM: CT ABDOMEN AND PELVIS WITHOUT CONTRAST TECHNIQUE: Multidetector CT imaging of the abdomen and pelvis was  performed following the standard protocol without IV contrast. COMPARISON:  None. FINDINGS: Lower chest: The visualized heart size within normal limits. No pericardial fluid/thickening. No hiatal hernia. Patchy airspace opacities seen at the posterior left lung base. Hepatobiliary: Although limited due to the lack of intravenous contrast, normal in appearance without gross focal abnormality. No evidence of calcified gallstones or biliary ductal dilatation. Pancreas:  Unremarkable.  No surrounding inflammatory changes. Spleen: Normal in size. Although limited due to the lack of intravenous contrast, normal in appearance. Adrenals/Urinary Tract: Both adrenal glands appear normal. The kidneys and collecting system appear normal without evidence of urinary tract calculus or hydronephrosis. Bladder is unremarkable. Stomach/Bowel: There is a mildly dilated fluid and food stuff filled stomach. Dilated air and fluid-filled loops small bowel are seen throughout the mid abdomen measuring up to 4.5 cm down to the level of the distal ileum within the right lower quadrant where there is a gradual area of narrowing which is not well seen. The distal ileal loops and terminal ileum are decompressed. There is been a prior partial right colectomy with surgical sutures noted. A moderate amount of colonic stool is seen throughout. Scattered colonic diverticula are seen. Vascular/Lymphatic: There are no enlarged abdominal or pelvic lymph nodes. Scattered aortic atherosclerotic calcifications are seen without aneurysmal dilatation. Reproductive: The deep pelvis is unremarkable. Other: No evidence of abdominal wall mass or hernia. Musculoskeletal: No acute or significant osseous findings. IMPRESSION: Findings consistent with a partial small bowel obstruction down to the level of the right lower abdomen where there is a gradual area of narrowing within the distal ileal loops. No definite focal transition point however is noted. Aortic  Atherosclerosis (ICD10-I70.0). Electronically Signed   By: Prudencio Pair M.D.   On: 04/28/2020 01:08   DG Chest 1 View  Result Date: 04/29/2020 CLINICAL DATA:  74 year old currently being treated for metastatic colon cancer, presenting with hypoxia. Former smoker with COPD. EXAM: PORTABLE CHEST 1 VIEW COMPARISON:  01/10/2020. FINDINGS: RIGHT jugular Port-A-Cath tip projects over the LOWER SVC, unchanged. Nasogastric tube tip projects over the fundus of the stomach. Cardiac silhouette normal in size, unchanged. Airspace consolidation involving the LEFT LOWER LOBE, silhouetting the LEFT hemidiaphragm. Lungs otherwise clear. Emphysematous changes throughout both lungs, unchanged. Probable small to moderate-sized LEFT pleural effusion. No visible RIGHT pleural effusion. IMPRESSION: 1. Acute LEFT LOWER LOBE pneumonia. 2. Probable small to moderate-sized LEFT pleural effusion. Electronically Signed   By: Evangeline Dakin M.D.   On: 04/29/2020 09:03   DG Abd 1 View  Result Date: 04/30/2020 CLINICAL DATA:  Small bowel obstruction. EXAM: ABDOMEN - 1 VIEW COMPARISON:  Abdominal CT 04/28/2020 FINDINGS: Gaseous small bowel distention in the left abdomen with greatest small bowel diameter of 3.7 cm. The degree of small-bowel distention appears slightly improved from prior. Decreased stool burden. Chain sutures in the left upper quadrant. No evidence of free air. Stable osseous structures. IMPRESSION: Gaseous small bowel distension with slight improvement from CT 2 days ago. Findings likely represent improving small bowel obstruction. Electronically Signed   By: Keith Rake M.D.   On: 04/30/2020 17:08   DG Abdomen Acute W/Chest  Result Date: 04/27/2020 CLINICAL DATA:  74 year old female with abdominal pain, nausea, atrial fibrillation with RVR. EXAM: DG ABDOMEN ACUTE W/ 1V CHEST COMPARISON:  CT Abdomen and Pelvis 04/19/2020 and earlier. FINDINGS: Portable AP upright view at 2336 hours. Increased elevation of the  left hemidiaphragm from the CT last week. Normal cardiac size and mediastinal contours. Visualized tracheal air column is within normal limits. Increased pulmonary interstitial markings appear stable. Right chest Port-A-Cath which is accessed. No pneumothorax. No definite pleural effusion. Upright and supine views of the abdomen and pelvis. Increased gastric and small bowel air since the prior CT, including dilated 4 cm left lower quadrant small bowel loops. Previous partial colectomy, but little to no gas is identified in the residual large bowel. Superimposed mesenteric masses demonstrated by CT. No pneumoperitoneum identified. Stable visualized osseous structures. IMPRESSION: 1. Appearance suspicious for new small-bowel obstruction since the CT 04/19/2020, perhaps secondary to the mesenteric mass seen at that time. 2. No pneumoperitoneum identified. 3. New elevation of the left hemidiaphragm probably related to #1. No definite pleural fluid. Electronically Signed   By: Genevie Ann M.D.   On: 04/27/2020 23:56     Assessment and Recommendation  74 y.o. female with known hypertension hyperlipidemia colon cancer with paroxysmal nonvalvular atrial fibrillation with rapid ventricular rate likely secondary to small bowel obstruction and left lower lobe pneumonia now well controlled on intravenous diltiazem now with atrial flutter with controlled ventricular rate well improving from current illness and no evidence of acute angina acute coronary syndrome or congestive heart failure 1.  Continue diltiazem drip for maintenance of normal sinus rhythm and/or treatment of atrial flutter at current dose without change 2.  Consideration of amiodarone drip if necessary if patient has significant progression of atrial fibrillation with rapid ventricular rate not responding to above but it appears that the patient is doing well with no need for additional treatment with amiodarone 3.  No additional anticoagulation at this time  other than deep venous thromboses prophylaxis and/or intravenous heparin due to concerns the patient may require surgical intervention 4.  No further cardiac diagnostics necessary at this time 5.  Continue full supportive care for small bowel obstruction and left lower lobe pneumonia  Signed, Serafina Royals M.D. FACC

## 2020-05-01 NOTE — Progress Notes (Signed)
PROGRESS NOTE    Kimberly [name]   EPP:[id number]  DOB: [birth year]  PCP: Birdie Sons, MD    DOA: 04/27/2020 LOS: 3   Brief Narrative   Kimberly [name] is a 74 y.o. female with medical history significant for metastatic colon cancer who was on hospice but revoked on 7/29 to pursue palliative radiation therapy, COPD with chronic hypoxic respiratory failure, hypertension, and atrial fibrillation, presented to the ED on 04/28/20 with abdominal pain, nausea, and vomiting which had progressive over a week. Was started on lactulose for constipation as outpatient, but got worse, prompting EMS to be called.  EMS found patient to be in A-fib with RVR (HR 180).  Her anticoagulation was discontinued recently due to hematuria due to tumor invasion of her urinary bladder.  In the ED, O2 sat mid 90's on 2 L/min oxygen, HR 150's, stable BP.  EKG showed A-fib with RVR 159 bpm and repolarization abnormality.  Labs notable for mild hypnatremia, stable chronic leukocytosis, thrombocytosis and improved normocytic anemia.  CT abdomen/pelvis concerning for small bowel obstruction.  Started on Cardizem gtt in the ED.   Admitted to hospitalist service.  General surgery is consulted, recommends conservative management of her SBO, with NG tube for decompression, NPO and IV fluids.  Unfortunately, patient is not a surgical candidate due to her advanced metastatic disease.     Assessment & Plan   Principal Problem:   Atrial fibrillation with RVR (HCC) Active Problems:   SBO (small bowel obstruction) (HCC)   Malignant neoplasm of colon (HCC)   Chronic obstructive pulmonary disease (HCC)   Chronic respiratory failure with hypoxia (HCC)   Sepsis secondary to community-acquired pneumonia -sepsis present on admission with tachycardia, worsened hypoxia, and left lower lobe infiltrate on x-ray of 7/31.  Initially thought to be SIRS at time of admission.  She has EF of 50 to 55% so will defer 30 mL/kg fluid bolus, now on  gentle maintenance fluid.  Monitor closely.  Lactic acidosis resolved.   Continue Rocephin and Zithromax.  Blood cultures pending.  Sputum culture, strep and Legionella urinary antigens.  Mucinex scheduled.  Acute on chronic respiratory failure with hypoxia -multifactorial, due to A. fib with RVR and pneumonia.  Continue supplemental oxygen as needed to keep O2 sat 90% or greater, wean as tolerated.  Still on 10 L/min NRB mask this morning  A. fib with RVR -history of paroxysmal A. fib.  Recently taken off Eliquis due to hematuria secondary to metastatic invasion of her bladder.  Cardiology consulted.  On Cardizem drip with HR controlled.    Small bowel obstruction -present on admission with abdominal pain, nausea, vomiting and CT findings showing obstruction.  Secondary to recurrent colon cancer.  General surgery is consulted.  NPO.  NG tube decompression.  Pain control and antiemetics as needed.  KUB 8/1 showed mild improvement.  Is passing little gas.  Left lower lobe pneumonia - present on admission, seen on chest x-ray ordered early this morning by night provider.  No prior chest imaging this admission.  Started on IV Rocephin and Zithromax.  Incentive spirometer.  Mucinex.  Hypokalemia - on NS+20 mEq K.  4 IVPB's today for 40 addl'l mEq's.    Daily BMP to monitor, check Mg level.  Replace as needed.  Maintain K> 4.0 and Mg> 2.0 given A-fib.  Metastatic colon cancer - please reference most recent palliative notes of 7/30 for details of history.  Patient had been on hospice care but revoked 2 days  prior to admission to pursue palliative radiation therapy.  Palliative following.  Supportive care.  Hematuria -due to tumor invasion of bladder.  Monitor closely Daily CBC.  SCDs for DVT prophylaxis.  COPD -on home oxygen.  Not acutely exacerbated, no wheezing on exam.  Treating for pneumonia which could exacerbate so we will monitor closely.  Continue supplemental oxygen, bronchodilators per  orders.  Chronic anemia -likely chronic disease.  Stable upon admission.  Monitor CBC.  Monitor for signs of bleeding.  She does have hematuria due to tumor invasion into her bladder.  SCDs for DVT prophylaxis.  Essential hypertension -currently with borderline BP on Cardizem infusion for A. fib RVR.  Home meds on hold while n.p.o.    Patient BMI: Body mass index is 25.46 kg/m.   DVT prophylaxis: SCDs Start: 04/28/20 0350 SCDs Start: 04/28/20 0350   Diet:  Diet Orders (From admission, onward)    Start     Ordered   04/28/20 0350  Diet NPO time specified  Diet effective now        04/28/20 0349            Code Status: DNR    Subjective 05/01/20    Patient seen bedside this morning, daughter was adamant at the time.  Patient reports she feels okay.  Passing little gas but no BMs yet.  Denies abdominal pain, nausea, vomiting.  She has productive cough, removed her oxygen to cough up phlegm which was clear.  Her oxygen saturation dropped into mid 80s briefly with mask off to do so.  She denies fevers/chills. Her O2 sat was 99% on 15 L/min, turned her down to 10 L/min and sats remained in the upper mid 90s during our conversation.  Disposition Plan & Communication   Status is: Inpatient  Remains inpatient appropriate because:Inpatient level of care appropriate due to severity of illness   Dispo: The patient is from: Home              Anticipated d/c is to: Home              Anticipated d/c date is: > 3 days              Patient currently is not medically stable to d/c.   Family Communication: Daughter was out of the room during encounter today   Consults, Procedures, Significant Events   Consultants:   Cardiology  Palliative care  Procedures:   None  Antimicrobials:   Rocephin and Zithromax 7/31 >>    Objective   Vitals:   04/30/20 2338 05/01/20 0503 05/01/20 0509 05/01/20 0729  BP: (!) 132/56 (!) 131/58  (!) 116/49  Pulse: 84 81  87  Resp: 22 23  16    Temp: 98 F (36.7 C)  98.1 F (36.7 C) 97.7 F (36.5 C)  TempSrc: Oral  Oral Oral  SpO2: 98% 100%  99%  Weight:      Height:        Intake/Output Summary (Last 24 hours) at 05/01/2020 0811 Last data filed at 05/01/2020 0731 Gross per 24 hour  Intake 2559.91 ml  Output 1800 ml  Net 759.91 ml   Filed Weights   04/27/20 2356 04/28/20 1635 04/29/20 0449  Weight: 69.9 kg 68.6 kg 67.3 kg    Physical Exam:  General exam: awake, alert, no acute distress HEENT: NG tube in left nare, dry mucus membranes, hearing grossly normal, wearing NRB mask Respiratory system: Left-sided rhonchi improved, right side is clear, normal respiratory  effort, on 10 L/min supplemental O2 by NRB mask. Cardiovascular system: normal S1/S2, irregular rhythm, regular rate, no pedal edema.   Gastrointestinal system: soft, NT, ND, +bowel sounds. Psychiatry: normal mood, congruent affect, judgement and insight appear normal  Labs   Data Reviewed: I have personally reviewed following labs and imaging studies  CBC: Recent Labs  Lab 04/27/20 2335 04/27/20 2335 04/28/20 0622 04/28/20 2254 04/29/20 0516 04/30/20 0526 05/01/20 0405  WBC 14.4*   < > 13.9* 15.3* 17.8* 18.2* 15.1*  NEUTROABS 11.3*  --   --   --   --   --   --   HGB 8.6*   < > 8.2* 8.5* 8.2* 8.1* 7.5*  HCT 27.3*   < > 27.7* 28.5* 27.2* 27.1* 25.5*  MCV 81.3   < > 83.4 82.8 82.2 82.1 82.3  PLT 571*   < > 548* 527* 474* 459* 444*   < > = values in this interval not displayed.   Basic Metabolic Panel: Recent Labs  Lab 04/27/20 2335 04/28/20 0622 04/29/20 0516 04/30/20 0526 05/01/20 0405  NA 134* 134* 131* 135 134*  K 3.5 3.7 3.1* 3.7 3.4*  CL 96* 97* 91* 97* 100  CO2 26 26 25 27 27   GLUCOSE 118* 111* 101* 93 84  BUN 21 20 15 15 9   CREATININE 0.81 0.66 0.64 0.56 0.42*  CALCIUM 8.1* 7.8* 7.6* 7.9* 7.8*  MG  --   --   --  1.9 1.9   GFR: Estimated Creatinine Clearance: 59.9 mL/min (A) (by C-G formula based on SCr of 0.42 mg/dL  (L)). Liver Function Tests: Recent Labs  Lab 04/27/20 2335  AST 12*  ALT 8  ALKPHOS 66  BILITOT 0.8  PROT 6.5  ALBUMIN 3.0*   No results for input(s): LIPASE, AMYLASE in the last 168 hours. No results for input(s): AMMONIA in the last 168 hours. Coagulation Profile: No results for input(s): INR, PROTIME in the last 168 hours. Cardiac Enzymes: No results for input(s): CKTOTAL, CKMB, CKMBINDEX, TROPONINI in the last 168 hours. BNP (last 3 results) No results for input(s): PROBNP in the last 8760 hours. HbA1C: No results for input(s): HGBA1C in the last 72 hours. CBG: No results for input(s): GLUCAP in the last 168 hours. Lipid Profile: No results for input(s): CHOL, HDL, LDLCALC, TRIG, CHOLHDL, LDLDIRECT in the last 72 hours. Thyroid Function Tests: No results for input(s): TSH, T4TOTAL, FREET4, T3FREE, THYROIDAB in the last 72 hours. Anemia Panel: No results for input(s): VITAMINB12, FOLATE, FERRITIN, TIBC, IRON, RETICCTPCT in the last 72 hours. Sepsis Labs: Recent Labs  Lab 04/29/20 2039  LATICACIDVEN 0.8    Recent Results (from the past 240 hour(s))  SARS Coronavirus 2 by RT PCR (hospital order, performed in [facility] hospital lab) Nasopharyngeal Nasopharyngeal Swab     Status: None   Collection Time: 04/28/20  2:51 AM   Specimen: Nasopharyngeal Swab  Result Value Ref Range Status   SARS Coronavirus 2 NEGATIVE NEGATIVE Final    Comment: (NOTE) SARS-CoV-2 target nucleic acids are NOT DETECTED.  The SARS-CoV-2 RNA is generally detectable in upper and lower respiratory specimens during the acute phase of infection. The lowest concentration of SARS-CoV-2 viral copies this assay can detect is 250 copies / mL. A negative result does not preclude SARS-CoV-2 infection and should not be used as the sole basis for treatment or other patient management decisions.  A negative result may occur with improper specimen collection / handling, submission of specimen other than  nasopharyngeal swab, presence of viral mutation(s) within the areas targeted by this assay, and inadequate number of viral copies (<250 copies / mL). A negative result must be combined with clinical observations, patient history, and epidemiological information.  Fact Sheet for Patients:   StrictlyIdeas.no  Fact Sheet for Healthcare Providers: BankingDealers.co.za  This test is not yet approved or  cleared by the [place] FDA and has been authorized for detection and/or diagnosis of SARS-CoV-2 by FDA under an Emergency Use Authorization (EUA).  This EUA will remain in effect (meaning this test can be used) for the duration of the COVID-19 declaration under Section 564(b)(1) of the Act, 21 U.S.C. section 360bbb-3(b)(1), unless the authorization is terminated or revoked sooner.  Performed at [facility], [address] [place], [place] [address]   CULTURE, BLOOD (ROUTINE X 2) w Reflex to ID Panel     Status: None (Preliminary result)   Collection Time: 04/29/20  8:39 PM   Specimen: BLOOD  Result Value Ref Range Status   Specimen Description BLOOD BLOOD RIGHT HAND  Final   Special Requests   Final    BOTTLES DRAWN AEROBIC ONLY Blood Culture results may not be optimal due to an inadequate volume of blood received in culture bottles   Culture   Final    NO GROWTH 2 DAYS Performed at [facility], [address], [place], [place] [address]    Report Status PENDING  Incomplete  CULTURE, BLOOD (ROUTINE X 2) w Reflex to ID Panel     Status: None (Preliminary result)   Collection Time: 04/29/20  8:57 PM   Specimen: BLOOD  Result Value Ref Range Status   Specimen Description BLOOD BLOOD LEFT HAND  Final   Special Requests   Final    BOTTLES DRAWN AEROBIC AND ANAEROBIC Blood Culture adequate volume   Culture   Final    NO GROWTH 2 DAYS Performed at [facility], [address] [place], [place]  [address]    Report Status PENDING  Incomplete  Expectorated sputum assessment w rflx to resp cult     Status: None   Collection Time: 04/30/20  5:34 PM   Specimen: Sputum  Result Value Ref Range Status   Specimen Description SPUTUM  Final   Special Requests NONE  Final   Sputum evaluation   Final    Sputum specimen not acceptable for testing.  Please recollect.   RESULT CALLED TO, READ BACK BY AND VERIFIED WITH: MARCEL TURNER ON 04/30/2020 [id number] Performed at [facility], [address] [place], [place] [address]    Report Status 04/30/2020 FINAL  Final      Imaging Studies   DG Abd 1 View  Result Date: 04/30/2020 CLINICAL DATA:  Small bowel obstruction. EXAM: ABDOMEN - 1 VIEW COMPARISON:  Abdominal CT 04/28/2020 FINDINGS: Gaseous small bowel distention in the left abdomen with greatest small bowel diameter of 3.7 cm. The degree of small-bowel distention appears slightly improved from prior. Decreased stool burden. Chain sutures in the left upper quadrant. No evidence of free air. Stable osseous structures. IMPRESSION: Gaseous small bowel distension with slight improvement from CT 2 days ago. Findings likely represent improving small bowel obstruction. Electronically Signed   By: Keith Rake M.D.   On: 04/30/2020 17:08     Medications   Scheduled Meds: . Chlorhexidine Gluconate Cloth  6 each Topical Daily  . dextromethorphan-guaiFENesin  1 tablet Oral BID  . fluticasone  2 spray Each Nare Daily  .  mometasone-formoterol  2 puff Inhalation BID  . sodium chloride flush  10-40 mL Intracatheter Q12H  . sodium chloride flush  3 mL Intravenous Q12H   Continuous Infusions: . sodium chloride 75 mL/hr at 05/01/20 0257  . azithromycin 500 mg (04/30/20 1240)  . cefTRIAXone (ROCEPHIN)  IV 1 g (04/30/20 1239)  . diltiazem (CARDIZEM) infusion 15 mg/hr (05/01/20 0259)       LOS: 3 days    Time spent: 26 minutes with greater than 50% spent in coordination care and direct  patient contact.    Ezekiel Slocumb, DO [facility]  05/01/2020, 8:11 AM    If 7PM-7AM, please contact night-coverage. How to contact the [facility] Attending or Consulting provider [place] or covering provider during after hours [place], for this patient?    1. Check the care team in [facility] and look for a) attending/consulting [facility] provider listed and b) the [facility] team listed 2. Log into [email or web address] and use [facility]'s universal password to access. If you do not have the password, please contact the hospital operator. 3. Locate the [facility] provider you are looking for under [facility] and page to a number that you can be directly reached. 4. If you still have difficulty reaching the provider, please page the [facility] (Director on Call) for the Hospitalists listed on amion for assistance.

## 2020-05-01 NOTE — Progress Notes (Signed)
[place]  Telephone:([phone] [phone]   Name: Kimberly [name] Date: 05/01/2020 MRN: [id number]  DOB: 1946-09-27  Patient Care Team: Birdie Sons, MD as PCP - General (Family Medicine) Earlie Server, MD as Consulting Physician (Oncology) Noreene Filbert, MD as Referring Physician (Radiation Oncology) Jules Husbands, MD as Consulting Physician (General Surgery) Corey Skains, MD as Consulting Physician (Cardiology)    REASON FOR CONSULTATION: Kimberly [name] is a 74 y.o. female with multiple medical problems including including metastatic colon cancer status post resection in 2018 with colostomy/reversal in 05/2018 and status post adjuvant FOLFOX. PET scan on 09/19/2019 revealed recurrent disease. Patient underwent CT-guided biopsy 08/20/2019 one of her abdominal wall mass revealing metastatic adenocarcinoma of the colon. Patient was hospitalized 12/08/2019-12/10/2019 with A. fib/RVR and was started on anticoagulation with Eliquis. On 12/24/2019 patient had anaphylactic reaction to cetuximab requiring intubation. Patient decided to forego further treatment. She was referred to hospice.  Patient developed hematuria in July 2021.  CT of abdomen/pelvis on 04/19/2020 revealed disease progression with new necrotic mass invading the bladder, increased nodularity in the right anterior abdominal wall, and peripheral enhancing lesion in the anterior uterine fundus suspicious for metastatic disease.  Patient's Eliquis was discontinued.  She was seen by urology and radiation oncology.  Patient ultimately revoked hospice to pursue XRT.  She subsequently was admitted to the hospital on 04/28/2020 with nausea and vomiting and found to have a partial SBO.  Palliative care was consulted help address goals..    CODE STATUS: DNR  PAST MEDICAL HISTORY: Past Medical History:  Diagnosis Date   Breast cancer, left ([facility]) 2004   Lumpectomy and rad tx's.    Chronic back pain    Colon cancer ([place]) 2018   COPD (chronic obstructive pulmonary disease) ([facility])    Degenerative disc disease, lumbar 04/2013   Family history of adverse reaction to anesthesia    son arrested after anesthesia   GERD (gastroesophageal reflux disease)    Hypertension    Iron deficiency anemia 05/27/2017   Personal history of chemotherapy 2018   Colon   Personal history of radiation therapy    Breast 2004   Personal history of radiation therapy 2018   Colon   Postprocedural intraabdominal abscess 09/01/2018   Small bowel obstruction ([place]) 04/16/2017   Wears dentures    full upper    PAST SURGICAL HISTORY:  Past Surgical History:  Procedure Laterality Date   APPENDECTOMY  1965   BREAST EXCISIONAL BIOPSY Left 08/01/2003   lumpectomy rad 11/04-2/28/2005   BREAST SURGERY     CESAREAN SECTION     x3   COLON SURGERY     COLONOSCOPY W/ POLYPECTOMY     COLONOSCOPY WITH PROPOFOL N/A 04/09/2018   Procedure: COLONOSCOPY WITH PROPOFOL;  Surgeon: Virgel Manifold, MD;  Location: [facility];  Service: Gastroenterology;  Laterality: N/A;   COLONOSCOPY WITH PROPOFOL N/A 08/13/2019   Procedure: COLONOSCOPY WITH PROPOFOL;  Surgeon: Virgel Manifold, MD;  Location: [facility];  Service: Endoscopy;  Laterality: N/A;   COLOSTOMY TAKEDOWN N/A 06/25/2018   Procedure: COLOSTOMY TAKEDOWN;  Surgeon: Jules Husbands, MD;  Location: [facility];  Service: General;  Laterality: N/A;   ESOPHAGOGASTRODUODENOSCOPY (EGD) WITH PROPOFOL N/A 04/04/2017   Procedure: ESOPHAGOGASTRODUODENOSCOPY (EGD) WITH PROPOFOL;  Surgeon: [name], MD;  Location: [facility];  Service: Endoscopy;  Laterality: N/A;   FRACTURE SURGERY     HIP FRACTURE SURGERY Left 01/25/2012  LAPAROSCOPY N/A 12/07/2019   Procedure: LAPAROSCOPY DIAGNOSTIC;  Surgeon: Jules Husbands, MD;  Location: [facility];  Service: General;  Laterality: N/A;   LAPAROTOMY N/A 04/15/2017    Procedure: EXPLORATORY LAPAROTOMY;  Surgeon: Clayburn Pert, MD;  Location: [facility];  Service: General;  Laterality: N/A;   NECK SURGERY  12/2011   PARASTOMAL HERNIA REPAIR N/A 12/03/2017   Procedure: HERNIA REPAIR PARASTOMAL;  Surgeon: Jules Husbands, MD;  Location: [facility];  Service: General;  Laterality: N/A;   PARASTOMAL HERNIA REPAIR N/A 06/25/2018   Procedure: HERNIA REPAIR PARASTOMAL;  Surgeon: Jules Husbands, MD;  Location: [facility];  Service: General;  Laterality: N/A;   PORTACATH PLACEMENT Right 05/21/2017   Procedure: INSERTION PORT-A-CATH;  Surgeon: Clayburn Pert, MD;  Location: [facility];  Service: General;  Laterality: Right;   PORTACATH PLACEMENT Right 12/07/2019   Procedure: INSERTION PORT-A-CATH;  Surgeon: Jules Husbands, MD;  Location: [facility];  Service: General;  Laterality: Right;   WRIST FRACTURE SURGERY      HEMATOLOGY/ONCOLOGY HISTORY:  Oncology History  Malignant neoplasm of colon ([place])  04/26/2017 Initial Diagnosis   Malignant neoplasm of transverse colon ([place])   11/06/2019 - 11/06/2019 Chemotherapy   The patient had palonosetron (ALOXI) injection 0.25 mg, 0.25 mg, Intravenous,  Once, 0 of 4 cycles irinotecan (CAMPTOSAR) 320 mg in sodium chloride 0.9 % 500 mL chemo infusion, 180 mg/m2, Intravenous,  Once, 0 of 4 cycles fluorouracil (ADRUCIL) chemo injection 750 mg, 400 mg/m2, Intravenous,  Once, 0 of 4 cycles fluorouracil (ADRUCIL) 4,350 mg in sodium chloride 0.9 % 63 mL chemo infusion, 2,400 mg/m2, Intravenous, 1 Day/Dose, 0 of 4 cycles bevacizumab-bvzr (ZIRABEV) 400 mg in sodium chloride 0.9 % 100 mL chemo infusion, 5 mg/kg, Intravenous,  Once, 0 of 4 cycles leucovorin 728 mg in sodium chloride 0.9 % 250 mL infusion, 400 mg/m2, Intravenous,  Once, 0 of 4 cycles  for chemotherapy treatment.    12/24/2019 - 12/24/2019 Chemotherapy   The patient had cetuximab (ERBITUX) chemo infusion 700 mg, 400 mg/m2 = 700 mg, Intravenous,  Once, 1 of 4 cycles Administration:  700 mg (12/24/2019)  for chemotherapy treatment.    01/07/2020 -  Chemotherapy   The patient had panitumumab (VECTIBIX) 400 mg in sodium chloride 0.9 % 100 mL chemo infusion, 6 mg/kg = 400 mg, Intravenous,  Once, 1 of 1 cycle Administration: 400 mg (01/07/2020) panitumumab (VECTIBIX) 400 mg in sodium chloride 0.9 % 100 mL chemo infusion, 6 mg/kg, Intravenous,  Once, 0 of 3 cycles  for chemotherapy treatment.      ALLERGIES:  is allergic to erbitux [cetuximab], betadine [povidone iodine], iodine, other, and sinus formula [cholestatin].  MEDICATIONS:  Current Facility-Administered Medications  Medication Dose Route Frequency Provider Last Rate Last Admin   0.9 % NaCl with KCl 20 mEq/ L  infusion   Intravenous Continuous Nicole Kindred A, DO       albuterol (PROVENTIL) (2.5 MG/3ML) 0.083% nebulizer solution 2.5 mg  2.5 mg Inhalation Q4H PRN Opyd, Ilene Qua, MD       azithromycin (ZITHROMAX) 500 mg in sodium chloride 0.9 % 250 mL IVPB  500 mg Intravenous Q24H Nicole Kindred A, DO 250 mL/hr at 05/01/20 1349 500 mg at 05/01/20 1349   cefTRIAXone (ROCEPHIN) 1 g in sodium chloride 0.9 % 100 mL IVPB  1 g Intravenous Q24H Nicole Kindred A, DO 200 mL/hr at 05/01/20 1347 1 g at 05/01/20 1347   Chlorhexidine Gluconate Cloth 2 % PADS 6 each  6  each Topical Daily Max Sane, MD   6 each at 05/01/20 0851   dextromethorphan-guaiFENesin ([place] DM) 30-600 MG per 12 hr tablet 1 tablet  1 tablet Oral BID Nicole Kindred A, DO       diltiazem (CARDIZEM) 125 mg in dextrose 5% 125 mL (1 mg/mL) infusion  5-15 mg/hr Intravenous Continuous Opyd, Ilene Qua, MD 15 mL/hr at 05/01/20 0259 15 mg/hr at 05/01/20 0259   fluticasone (FLONASE) 50 MCG/ACT nasal spray 2 spray  2 spray Each Nare Daily Sharion Settler, NP   2 spray at 04/28/20 2222   LORazepam (ATIVAN) injection 0.5 mg  0.5 mg Intravenous Q4H PRN Sharion Settler, NP   0.5 mg at 04/30/20 1239   mometasone-formoterol (DULERA) 100-5 MCG/ACT inhaler 2 puff   2 puff Inhalation BID Vianne Bulls, MD   2 puff at 05/01/20 0851   morphine 2 MG/ML injection 2 mg  2 mg Intravenous Q2H PRN Sharion Settler, NP   2 mg at 05/01/20 0944   ondansetron (ZOFRAN) tablet 4 mg  4 mg Oral Q6H PRN Opyd, Ilene Qua, MD       Or   ondansetron (ZOFRAN) injection 4 mg  4 mg Intravenous Q6H PRN Opyd, Ilene Qua, MD   4 mg at 04/28/20 8546   phenol (CHLORASEPTIC) mouth spray 1 spray  1 spray Mouth/Throat PRN Nicole Kindred A, DO       sodium chloride flush (NS) 0.9 % injection 10-40 mL  10-40 mL Intracatheter Q12H Nicole Kindred A, DO       sodium chloride flush (NS) 0.9 % injection 10-40 mL  10-40 mL Intracatheter PRN Nicole Kindred A, DO       sodium chloride flush (NS) 0.9 % injection 3 mL  3 mL Intravenous Q12H Opyd, Ilene Qua, MD   3 mL at 04/29/20 2031    VITAL SIGNS: BP (!) 119/56 (BP Location: Right Arm)    Pulse 83    Temp 98.3 F (36.8 C) (Oral)    Resp 16    Ht 5\' 4"  (1.626 m)    Wt 148 lb 4.8 oz (67.3 kg)    SpO2 99%    BMI 25.46 kg/m  Filed Weights   04/27/20 2356 04/28/20 1635 04/29/20 0449  Weight: 154 lb (69.9 kg) 151 lb 3.2 oz (68.6 kg) 148 lb 4.8 oz (67.3 kg)    Estimated body mass index is 25.46 kg/m as calculated from the following:   Height as of this encounter: 5\' 4"  (1.626 m).   Weight as of this encounter: 148 lb 4.8 oz (67.3 kg).  LABS: CBC:    Component Value Date/Time   WBC 15.1 (H) 05/01/2020 0405   HGB 7.5 (L) 05/01/2020 0405   HGB 12.2 01/02/2017 1410   HCT 25.5 (L) 05/01/2020 0405   HCT 37.3 01/02/2017 1410   PLT 444 (H) 05/01/2020 0405   PLT 486 (H) 01/02/2017 1410   MCV 82.3 05/01/2020 0405   MCV 86 01/02/2017 1410   MCV 96 01/29/2012 0428   NEUTROABS 11.3 (H) 04/27/2020 2335   NEUTROABS 7.8 (H) 01/02/2017 1410   NEUTROABS 9.2 (H) 01/29/2012 0428   LYMPHSABS 1.0 04/27/2020 2335   LYMPHSABS 3.9 (H) 01/02/2017 1410   LYMPHSABS 1.9 01/29/2012 0428   MONOABS 1.9 (H) 04/27/2020 2335   MONOABS 2.0 (H) 01/29/2012  0428   EOSABS 0.0 04/27/2020 2335   EOSABS 0.4 01/02/2017 1410   EOSABS 0.1 01/29/2012 0428   BASOSABS 0.1 04/27/2020 2335   BASOSABS 0.1  01/02/2017 1410   BASOSABS 0.1 01/29/2012 0428   Comprehensive Metabolic Panel:    Component Value Date/Time   NA 134 (L) 05/01/2020 0405   NA 141 01/02/2017 1410   NA 136 01/27/2012 0637   K 3.4 (L) 05/01/2020 0405   K 4.3 01/27/2012 0637   CL 100 05/01/2020 0405   CL 102 01/27/2012 0637   CO2 27 05/01/2020 0405   CO2 25 01/27/2012 0637   BUN 9 05/01/2020 0405   BUN 14 01/02/2017 1410   BUN 15 01/27/2012 0637   CREATININE 0.42 (L) 05/01/2020 0405   CREATININE 0.67 01/27/2012 0637   GLUCOSE 84 05/01/2020 0405   GLUCOSE 119 (H) 01/27/2012 0637   CALCIUM 7.8 (L) 05/01/2020 0405   CALCIUM 7.7 (L) 01/27/2012 0637   AST 12 (L) 04/27/2020 2335   AST 17 01/26/2012 0021   ALT 8 04/27/2020 2335   ALT 19 01/26/2012 0021   ALKPHOS 66 04/27/2020 2335   ALKPHOS 87 01/26/2012 0021   BILITOT 0.8 04/27/2020 2335   BILITOT <0.2 01/02/2017 1410   BILITOT 0.2 01/26/2012 0021   PROT 6.5 04/27/2020 2335   PROT 7.0 01/02/2017 1410   PROT 7.6 01/26/2012 0021   ALBUMIN 3.0 (L) 04/27/2020 2335   ALBUMIN 3.9 01/02/2017 1410   ALBUMIN 3.5 01/26/2012 0021    RADIOGRAPHIC STUDIES: CT Abdomen Pelvis W Wo Contrast  Result Date: 04/19/2020 CLINICAL DATA:  Painless hematuria for 5 days. History of colon cancer with colostomy and reversal. EXAM: CT ABDOMEN AND PELVIS WITHOUT AND WITH CONTRAST TECHNIQUE: Multidetector CT imaging of the abdomen and pelvis was performed following the standard protocol before and following the bolus administration of intravenous contrast. CONTRAST:  166mL OMNIPAQUE IOHEXOL 300 MG/ML  SOLN COMPARISON:  Abdominopelvic CT 12/08/2019. FINDINGS: Lower chest: Stable linear scarring in the right lower lobe and mild chronic central airway thickening at both lung bases. Hepatobiliary: The liver is normal in density without suspicious focal  abnormality. No evidence of gallstones, gallbladder wall thickening or biliary dilatation. Pancreas: Unremarkable. No pancreatic ductal dilatation or surrounding inflammatory changes. Spleen: Normal in size without focal abnormality. Adrenals/Urinary Tract: There are stable low-density adrenal masses consistent with adenomas. These measure up to 2.7 cm on the right and 5.3 cm on the left. Pre-contrast images demonstrate no renal, ureteral or bladder calculi. Post-contrast, both kidneys enhance normally. There is no evidence of enhancing renal mass. Delayed images result in segmental visualization of the ureters. No focal upper tract urothelial abnormalities are identified. New abnormal tenting of the bladder superiorly on the right with an adjacent ill-defined low-density mass with peripheral enhancement measuring up to 4.8 x 3.5 cm on image 61/5. This is inferior to the ileocolonic anastomosis and is worrisome for local recurrence of colon cancer with central necrosis. This mass may invade the bladder. There is mild bladder wall thickening without other focal bladder abnormality. Stomach/Bowel: Stable mild diffuse gastric wall thickening. No small bowel distension or surrounding inflammation. Status post right hemicolectomy with ileocolonic anastomosis. There is an additional anastomosis in the distal transverse colon. No evidence of bowel obstruction. Vascular/Lymphatic: As above, new centrally necrotic mass in the right lower quadrant worrisome for local recurrence of colon cancer or metastatic adenopathy. There are no enlarged retroperitoneal lymph nodes. Diffuse aortic and branch vessel atherosclerosis. The portal, superior mesenteric and splenic veins are patent. Reproductive: Peripherally enhancing lesion along the anterior uterine fundus measures up to 2.9 x 2.1 cm on image 59/11. This could reflect a uterine fibroid, although is  not seen on previous studies, and could reflect a metastasis given the other  findings. No adnexal mass. Other: In addition to the right lower quadrant centrally necrotic lesion described above, there is a small spiculated lesion posterior to the ileocolonic anastomosis measuring 12 mm on image 50/5. There is increased nodularity in the right anterior abdominal wall measuring 3.2 x 2.3 cm on image 39/5. No ascites or generalized peritoneal nodularity. Musculoskeletal: No acute or significant osseous findings. Previous left hip ORIF. IMPRESSION: 1. New centrally necrotic mass in the right lower quadrant with abnormal tenting of the bladder and possible bladder invasion, worrisome for local recurrence of colon cancer or metastatic adenopathy. At least one other mildly enlarged lymph node is noted. 2. Increased nodularity in the right anterior abdominal wall, suspicious for metastatic disease. 3. Peripherally enhancing lesion in the anterior uterine fundus suspicious for metastatic disease given nonvisualization on previous imaging. 4. No evidence of urinary tract calculus, renal mass or hydronephrosis. 5. Stable bilateral adrenal adenomas. 6. Aortic Atherosclerosis (ICD10-I70.0). 7. These results will be called to the ordering clinician or representative by the Radiologist Assistant, and communication documented in the PACS or [employer]. Electronically Signed   By: Richardean Sale M.D.   On: 04/19/2020 16:16   CT ABDOMEN PELVIS WO CONTRAST  Result Date: 04/28/2020 CLINICAL DATA:  Nonlocalized abdominal pain with nausea EXAM: CT ABDOMEN AND PELVIS WITHOUT CONTRAST TECHNIQUE: Multidetector CT imaging of the abdomen and pelvis was performed following the standard protocol without IV contrast. COMPARISON:  None. FINDINGS: Lower chest: The visualized heart size within normal limits. No pericardial fluid/thickening. No hiatal hernia. Patchy airspace opacities seen at the posterior left lung base. Hepatobiliary: Although limited due to the lack of intravenous contrast, normal in appearance  without gross focal abnormality. No evidence of calcified gallstones or biliary ductal dilatation. Pancreas:  Unremarkable.  No surrounding inflammatory changes. Spleen: Normal in size. Although limited due to the lack of intravenous contrast, normal in appearance. Adrenals/Urinary Tract: Both adrenal glands appear normal. The kidneys and collecting system appear normal without evidence of urinary tract calculus or hydronephrosis. Bladder is unremarkable. Stomach/Bowel: There is a mildly dilated fluid and food stuff filled stomach. Dilated air and fluid-filled loops small bowel are seen throughout the mid abdomen measuring up to 4.5 cm down to the level of the distal ileum within the right lower quadrant where there is a gradual area of narrowing which is not well seen. The distal ileal loops and terminal ileum are decompressed. There is been a prior partial right colectomy with surgical sutures noted. A moderate amount of colonic stool is seen throughout. Scattered colonic diverticula are seen. Vascular/Lymphatic: There are no enlarged abdominal or pelvic lymph nodes. Scattered aortic atherosclerotic calcifications are seen without aneurysmal dilatation. Reproductive: The deep pelvis is unremarkable. Other: No evidence of abdominal wall mass or hernia. Musculoskeletal: No acute or significant osseous findings. IMPRESSION: Findings consistent with a partial small bowel obstruction down to the level of the right lower abdomen where there is a gradual area of narrowing within the distal ileal loops. No definite focal transition point however is noted. Aortic Atherosclerosis (ICD10-I70.0). Electronically Signed   By: Prudencio Pair M.D.   On: 04/28/2020 01:08   DG Chest 1 View  Result Date: 04/29/2020 CLINICAL DATA:  74 year old currently being treated for metastatic colon cancer, presenting with hypoxia. Former smoker with COPD. EXAM: PORTABLE CHEST 1 VIEW COMPARISON:  01/10/2020. FINDINGS: RIGHT jugular Port-A-Cath  tip projects over the LOWER SVC, unchanged. Nasogastric  tube tip projects over the fundus of the stomach. Cardiac silhouette normal in size, unchanged. Airspace consolidation involving the LEFT LOWER LOBE, silhouetting the LEFT hemidiaphragm. Lungs otherwise clear. Emphysematous changes throughout both lungs, unchanged. Probable small to moderate-sized LEFT pleural effusion. No visible RIGHT pleural effusion. IMPRESSION: 1. Acute LEFT LOWER LOBE pneumonia. 2. Probable small to moderate-sized LEFT pleural effusion. Electronically Signed   By: Evangeline Dakin M.D.   On: 04/29/2020 09:03   DG Abd 1 View  Result Date: 04/30/2020 CLINICAL DATA:  Small bowel obstruction. EXAM: ABDOMEN - 1 VIEW COMPARISON:  Abdominal CT 04/28/2020 FINDINGS: Gaseous small bowel distention in the left abdomen with greatest small bowel diameter of 3.7 cm. The degree of small-bowel distention appears slightly improved from prior. Decreased stool burden. Chain sutures in the left upper quadrant. No evidence of free air. Stable osseous structures. IMPRESSION: Gaseous small bowel distension with slight improvement from CT 2 days ago. Findings likely represent improving small bowel obstruction. Electronically Signed   By: Keith Rake M.D.   On: 04/30/2020 17:08   DG Abdomen Acute W/Chest  Result Date: 04/27/2020 CLINICAL DATA:  74 year old female with abdominal pain, nausea, atrial fibrillation with RVR. EXAM: DG ABDOMEN ACUTE W/ 1V CHEST COMPARISON:  CT Abdomen and Pelvis 04/19/2020 and earlier. FINDINGS: Portable AP upright view at 2336 hours. Increased elevation of the left hemidiaphragm from the CT last week. Normal cardiac size and mediastinal contours. Visualized tracheal air column is within normal limits. Increased pulmonary interstitial markings appear stable. Right chest Port-A-Cath which is accessed. No pneumothorax. No definite pleural effusion. Upright and supine views of the abdomen and pelvis. Increased gastric and  small bowel air since the prior CT, including dilated 4 cm left lower quadrant small bowel loops. Previous partial colectomy, but little to no gas is identified in the residual large bowel. Superimposed mesenteric masses demonstrated by CT. No pneumoperitoneum identified. Stable visualized osseous structures. IMPRESSION: 1. Appearance suspicious for new small-bowel obstruction since the CT 04/19/2020, perhaps secondary to the mesenteric mass seen at that time. 2. No pneumoperitoneum identified. 3. New elevation of the left hemidiaphragm probably related to #1. No definite pleural fluid. Electronically Signed   By: Genevie Ann M.D.   On: 04/27/2020 23:56    PERFORMANCE STATUS (ECOG) : 3 - Symptomatic, >50% confined to bed  Review of Systems Unless otherwise noted, a complete review of systems is negative.  Physical Exam General: NAD, frail appearing Pulmonary: Unlabored Extremities: no edema, no joint deformities Skin: no rashes Neurological: Weakness but otherwise nonfocal  IMPRESSION: Abdominal x-ray yesterday revealed slight improvement.  Patient is passing some gas but no BM.  NGT in place.  Patient also has a nonrebreather on 10 L.  Overall, she feels better and is hopeful that she will be able to have NGT removed.  She is pending repeat abdominal x-ray.  We discussed overall goals including option for hospice either at home or a hospice facility.  Patient remains undecided on whether to pursue XRT.  I did discuss with Dr. Baruch Gouty, who recommended hospice.  This information was relayed to patient's daughter.  PLAN: -Continue best supportive care -We will follow    Time Total: 25 minutes  Visit consisted of counseling and education dealing with the complex and emotionally intense issues of symptom management and palliative care in the setting of serious and potentially life-threatening illness.Greater than 50%  of this time was spent counseling and coordinating care related to the above  assessment and plan.  Signed  by: Altha Harm, PhD, NP-C

## 2020-05-02 ENCOUNTER — Ambulatory Visit: Payer: Medicare HMO

## 2020-05-02 ENCOUNTER — Inpatient Hospital Stay: Payer: Medicare Other

## 2020-05-02 LAB — BASIC METABOLIC PANEL
Anion gap: 9 (ref 5–15)
BUN: 7 mg/dL — ABNORMAL LOW (ref 8–23)
CO2: 27 mmol/L (ref 22–32)
Calcium: 8.2 mg/dL — ABNORMAL LOW (ref 8.9–10.3)
Chloride: 98 mmol/L (ref 98–111)
Creatinine, Ser: 0.5 mg/dL (ref 0.44–1.00)
GFR calc Af Amer: >60 mL/min (ref >60)
GFR calc non Af Amer: >60 mL/min (ref >60)
Glucose, Bld: 84 mg/dL (ref 70–99)
Potassium: 3.9 mmol/L (ref 3.5–5.1)
Sodium: 134 mmol/L — ABNORMAL LOW (ref 135–145)

## 2020-05-02 LAB — CBC
HCT: 25.6 % — ABNORMAL LOW (ref 36.0–46.0)
Hemoglobin: 7.9 g/dL — ABNORMAL LOW (ref 12.0–15.0)
MCH: 25 pg — ABNORMAL LOW (ref 26.0–34.0)
MCHC: 30.9 g/dL (ref 30.0–36.0)
MCV: 81 fL (ref 80.0–100.0)
Platelets: 423 10*3/uL — ABNORMAL HIGH (ref 150–400)
RBC: 3.16 MIL/uL — ABNORMAL LOW (ref 3.87–5.11)
RDW: 17.3 % — ABNORMAL HIGH (ref 11.5–15.5)
WBC: 12.1 10*3/uL — ABNORMAL HIGH (ref 4.0–10.5)
nRBC: 0 % (ref 0.0–0.2)

## 2020-05-02 LAB — MAGNESIUM: Magnesium: 2 mg/dL (ref 1.7–2.4)

## 2020-05-02 MED ORDER — CALCIUM CARBONATE ANTACID 500 MG PO CHEW
1.0000 | CHEWABLE_TABLET | Freq: Three times a day (TID) | ORAL | Status: DC | PRN
  Filled 2020-05-02: qty 1

## 2020-05-02 MED ORDER — PANTOPRAZOLE SODIUM 40 MG IV SOLR
40.0000 mg | INTRAVENOUS | Status: DC
  Administered 2020-05-02 – 2020-05-03 (×2): 40 mg via INTRAVENOUS
  Filled 2020-05-02 (×2): qty 40

## 2020-05-02 NOTE — Progress Notes (Signed)
Patient back into Afib RVR. Cardizem gtt titrated back up. Dr. Arbutus Ped aware of moderately soft BP. Will monitor closely.

## 2020-05-02 NOTE — Progress Notes (Signed)
   05/02/20 1430  Assess: MEWS Score  Temp 98.4 F (36.9 C)  BP 107/71  Pulse Rate (!) 157  ECG Heart Rate (!) 157  Resp (!) 25  Level of Consciousness Alert  SpO2 99 %  O2 Device Non-rebreather Mask  Assess: MEWS Score  MEWS Temp 0  MEWS Systolic 0  MEWS Pulse 3  MEWS RR 1  MEWS LOC 0  MEWS Score 4  MEWS Score Color Red  Assess: if the MEWS score is Yellow or Red  Were vital signs taken at a resting state? Yes  Focused Assessment No change from prior assessment  Early Detection of Sepsis Score *See Row Information* Medium  MEWS guidelines implemented *See Row Information* Yes  Treat  MEWS Interventions Administered scheduled meds/treatments (titrating gtt)  Pain Scale 0-10  Pain Score 2  Take Vital Signs  Increase Vital Sign Frequency  Red: Q 1hr X 4 then Q 4hr X 4, if remains red, continue Q 4hrs  Escalate  MEWS: Escalate Red: discuss with charge nurse/RN and provider, consider discussing with RRT  Notify: Charge Nurse/RN  Name of Charge Nurse/RN Notified Adrian Prince RN  Date Charge Nurse/RN Notified 05/02/20  Time Charge Nurse/RN Notified 1430  Notify: Provider  Provider Name/Title Nicole Kindred, Serafina Royals  Date Provider Notified 05/02/20  Time Provider Notified 1430  Notification Type  (secure chat)  Notification Reason Other (Comment) (update)  Response Other (Comment) (continue titrating gtt up to 15)  Date of Provider Response 05/02/20  Time of Provider Response 1430 (Dr. Arbutus Ped responded to chat)  Document  Patient Outcome Other (Comment) (monitoring with gtt titration, asymptomatic)  Progress note created (see row info) Yes

## 2020-05-02 NOTE — Progress Notes (Signed)
[facility] Encounter Note  Patient: Kimberly [name] / Admit Date: 04/27/2020 / Date of Encounter: 05/02/2020, 12:15 PM   Subjective: Overall patient does feel much better with less abdominal discomfort and slight amount of passing gas and no significant abdominal bloating at this time.  The patient has not had any significant chest discomfort shortness of breath cough or congestion today.  Heart rate is still well controlled and now with normal sinus rhythm r with controlled ventricular rate with diltiazem drip for which she has had reasonable blood pressure control as well.  No current need for additional treatment with amiodarone drip while small bowel obstruction and pneumonia is slowly improving overall from full illness  Review of Systems: Positive for: Weakness Negative for: Vision change, hearing change, syncope, dizziness, nausea, vomiting,diarrhea, bloody stool, stomach pain, cough, congestion, diaphoresis, urinary frequency, urinary pain,skin lesions, skin rashes Others previously listed  Objective: Telemetry: Normal sinus rhythm with controlled ventricular rate Physical Exam: Blood pressure 129/62, pulse 84, temperature 98.5 F (36.9 C), temperature source Oral, resp. rate 13, height 5\' 4"  (1.626 m), weight 68 kg, SpO2 99 %. Body mass index is 25.75 kg/m. General: Well developed, well nourished, in no acute distress. Head: Normocephalic, atraumatic, sclera non-icteric, no xanthomas, nares are without discharge. Neck: No apparent masses Lungs: Normal respirations with no wheezes, few rhonchi, no rales , no crackles   Heart: Regular rate and rhythm, normal S1 S2, no murmur, no rub, no gallop, PMI is normal size and placement, carotid upstroke normal without bruit, jugular venous pressure normal Abdomen: Soft, tender,  distended with abnormal bowel sounds. No hepatosplenomegaly. Abdominal aorta is normal size without bruit Extremities: Trace edema, no clubbing, no  cyanosis, no ulcers,  Peripheral: 2+ radial, 2+ femoral, 2+ dorsal pedal pulses Neuro: Alert and oriented. Moves all extremities spontaneously. Psych:  Responds to questions appropriately with a normal affect.   Intake/Output Summary (Last 24 hours) at 05/02/2020 1215 Last data filed at 05/02/2020 1121 Gross per 24 hour  Intake 944.64 ml  Output 3850 ml  Net -2905.36 ml    Inpatient Medications:  . Chlorhexidine Gluconate Cloth  6 each Topical Daily  . dextromethorphan-guaiFENesin  1 tablet Oral BID  . fluticasone  2 spray Each Nare Daily  . mometasone-formoterol  2 puff Inhalation BID  . pantoprazole (PROTONIX) IV  40 mg Intravenous Q24H  . sodium chloride flush  10-40 mL Intracatheter Q12H  . sodium chloride flush  3 mL Intravenous Q12H   Infusions:  . 0.9 % NaCl with KCl 20 mEq / L 75 mL/hr at 05/02/20 1001  . azithromycin 500 mg (05/01/20 1349)  . cefTRIAXone (ROCEPHIN)  IV 1 g (05/01/20 1347)  . diltiazem (CARDIZEM) infusion 5 mg/hr (05/02/20 1003)    Labs: Recent Labs    05/01/20 0405 05/02/20 0516  NA 134* 134*  K 3.4* 3.9  CL 100 98  CO2 27 27  GLUCOSE 84 84  BUN 9 7*  CREATININE 0.42* 0.50  CALCIUM 7.8* 8.2*  MG 1.9 2.0   No results for input(s): AST, ALT, ALKPHOS, BILITOT, PROT, ALBUMIN in the last 72 hours. Recent Labs    05/01/20 0405 05/02/20 0516  WBC 15.1* 12.1*  HGB 7.5* 7.9*  HCT 25.5* 25.6*  MCV 82.3 81.0  PLT 444* 423*   No results for input(s): CKTOTAL, CKMB, TROPONINI in the last 72 hours. Invalid input(s): POCBNP No results for input(s): HGBA1C in the last 72 hours.   Weights: [employer]  04/28/20 1635 04/29/20 0449 05/02/20 0458  Weight: 68.6 kg 67.3 kg 68 kg     Radiology/Studies:  CT Abdomen Pelvis W Wo Contrast  Result Date: 04/19/2020 CLINICAL DATA:  Painless hematuria for 5 days. History of colon cancer with colostomy and reversal. EXAM: CT ABDOMEN AND PELVIS WITHOUT AND WITH CONTRAST TECHNIQUE: Multidetector CT imaging  of the abdomen and pelvis was performed following the standard protocol before and following the bolus administration of intravenous contrast. CONTRAST:  121mL OMNIPAQUE IOHEXOL 300 MG/ML  SOLN COMPARISON:  Abdominopelvic CT 12/08/2019. FINDINGS: Lower chest: Stable linear scarring in the right lower lobe and mild chronic central airway thickening at both lung bases. Hepatobiliary: The liver is normal in density without suspicious focal abnormality. No evidence of gallstones, gallbladder wall thickening or biliary dilatation. Pancreas: Unremarkable. No pancreatic ductal dilatation or surrounding inflammatory changes. Spleen: Normal in size without focal abnormality. Adrenals/Urinary Tract: There are stable low-density adrenal masses consistent with adenomas. These measure up to 2.7 cm on the right and 5.3 cm on the left. Pre-contrast images demonstrate no renal, ureteral or bladder calculi. Post-contrast, both kidneys enhance normally. There is no evidence of enhancing renal mass. Delayed images result in segmental visualization of the ureters. No focal upper tract urothelial abnormalities are identified. New abnormal tenting of the bladder superiorly on the right with an adjacent ill-defined low-density mass with peripheral enhancement measuring up to 4.8 x 3.5 cm on image 61/5. This is inferior to the ileocolonic anastomosis and is worrisome for local recurrence of colon cancer with central necrosis. This mass may invade the bladder. There is mild bladder wall thickening without other focal bladder abnormality. Stomach/Bowel: Stable mild diffuse gastric wall thickening. No small bowel distension or surrounding inflammation. Status post right hemicolectomy with ileocolonic anastomosis. There is an additional anastomosis in the distal transverse colon. No evidence of bowel obstruction. Vascular/Lymphatic: As above, new centrally necrotic mass in the right lower quadrant worrisome for local recurrence of colon cancer  or metastatic adenopathy. There are no enlarged retroperitoneal lymph nodes. Diffuse aortic and branch vessel atherosclerosis. The portal, superior mesenteric and splenic veins are patent. Reproductive: Peripherally enhancing lesion along the anterior uterine fundus measures up to 2.9 x 2.1 cm on image 59/11. This could reflect a uterine fibroid, although is not seen on previous studies, and could reflect a metastasis given the other findings. No adnexal mass. Other: In addition to the right lower quadrant centrally necrotic lesion described above, there is a small spiculated lesion posterior to the ileocolonic anastomosis measuring 12 mm on image 50/5. There is increased nodularity in the right anterior abdominal wall measuring 3.2 x 2.3 cm on image 39/5. No ascites or generalized peritoneal nodularity. Musculoskeletal: No acute or significant osseous findings. Previous left hip ORIF. IMPRESSION: 1. New centrally necrotic mass in the right lower quadrant with abnormal tenting of the bladder and possible bladder invasion, worrisome for local recurrence of colon cancer or metastatic adenopathy. At least one other mildly enlarged lymph node is noted. 2. Increased nodularity in the right anterior abdominal wall, suspicious for metastatic disease. 3. Peripherally enhancing lesion in the anterior uterine fundus suspicious for metastatic disease given nonvisualization on previous imaging. 4. No evidence of urinary tract calculus, renal mass or hydronephrosis. 5. Stable bilateral adrenal adenomas. 6. Aortic Atherosclerosis (ICD10-I70.0). 7. These results will be called to the ordering clinician or representative by the Radiologist Assistant, and communication documented in the PACS or [employer]. Electronically Signed   By: Caryl Comes.D.  On: 04/19/2020 16:16   CT ABDOMEN PELVIS WO CONTRAST  Result Date: 04/28/2020 CLINICAL DATA:  Nonlocalized abdominal pain with nausea EXAM: CT ABDOMEN AND PELVIS WITHOUT  CONTRAST TECHNIQUE: Multidetector CT imaging of the abdomen and pelvis was performed following the standard protocol without IV contrast. COMPARISON:  None. FINDINGS: Lower chest: The visualized heart size within normal limits. No pericardial fluid/thickening. No hiatal hernia. Patchy airspace opacities seen at the posterior left lung base. Hepatobiliary: Although limited due to the lack of intravenous contrast, normal in appearance without gross focal abnormality. No evidence of calcified gallstones or biliary ductal dilatation. Pancreas:  Unremarkable.  No surrounding inflammatory changes. Spleen: Normal in size. Although limited due to the lack of intravenous contrast, normal in appearance. Adrenals/Urinary Tract: Both adrenal glands appear normal. The kidneys and collecting system appear normal without evidence of urinary tract calculus or hydronephrosis. Bladder is unremarkable. Stomach/Bowel: There is a mildly dilated fluid and food stuff filled stomach. Dilated air and fluid-filled loops small bowel are seen throughout the mid abdomen measuring up to 4.5 cm down to the level of the distal ileum within the right lower quadrant where there is a gradual area of narrowing which is not well seen. The distal ileal loops and terminal ileum are decompressed. There is been a prior partial right colectomy with surgical sutures noted. A moderate amount of colonic stool is seen throughout. Scattered colonic diverticula are seen. Vascular/Lymphatic: There are no enlarged abdominal or pelvic lymph nodes. Scattered aortic atherosclerotic calcifications are seen without aneurysmal dilatation. Reproductive: The deep pelvis is unremarkable. Other: No evidence of abdominal wall mass or hernia. Musculoskeletal: No acute or significant osseous findings. IMPRESSION: Findings consistent with a partial small bowel obstruction down to the level of the right lower abdomen where there is a gradual area of narrowing within the distal  ileal loops. No definite focal transition point however is noted. Aortic Atherosclerosis (ICD10-I70.0). Electronically Signed   By: Prudencio Pair M.D.   On: 04/28/2020 01:08   DG Chest 1 View  Result Date: 04/29/2020 CLINICAL DATA:  74 year old currently being treated for metastatic colon cancer, presenting with hypoxia. Former smoker with COPD. EXAM: PORTABLE CHEST 1 VIEW COMPARISON:  01/10/2020. FINDINGS: RIGHT jugular Port-A-Cath tip projects over the LOWER SVC, unchanged. Nasogastric tube tip projects over the fundus of the stomach. Cardiac silhouette normal in size, unchanged. Airspace consolidation involving the LEFT LOWER LOBE, silhouetting the LEFT hemidiaphragm. Lungs otherwise clear. Emphysematous changes throughout both lungs, unchanged. Probable small to moderate-sized LEFT pleural effusion. No visible RIGHT pleural effusion. IMPRESSION: 1. Acute LEFT LOWER LOBE pneumonia. 2. Probable small to moderate-sized LEFT pleural effusion. Electronically Signed   By: Evangeline Dakin M.D.   On: 04/29/2020 09:03   DG Abd 1 View  Result Date: 05/02/2020 CLINICAL DATA:  Bowel obstruction EXAM: ABDOMEN - 1 VIEW COMPARISON:  April 30, 2020 FINDINGS: Nasogastric tube tip and side port in proximal stomach. There remain loops of mildly dilated small bowel, similar to recent prior study. No air-fluid levels. No evident free air. There is atelectatic change in the visualized left lung base. Postoperative change noted in the proximal left femur. Occasional vascular calcifications noted in the pelvis. IMPRESSION: Nasogastric tube tip and side port in proximal stomach. Mild small bowel dilatation in the left abdomen persists. No air-fluid level. No free air. Suspect that there is a residual degree of bowel obstruction. Electronically Signed   By: Lowella Grip III M.D.   On: 05/02/2020 08:00   DG Abd 1  View  Result Date: 04/30/2020 CLINICAL DATA:  Small bowel obstruction. EXAM: ABDOMEN - 1 VIEW COMPARISON:   Abdominal CT 04/28/2020 FINDINGS: Gaseous small bowel distention in the left abdomen with greatest small bowel diameter of 3.7 cm. The degree of small-bowel distention appears slightly improved from prior. Decreased stool burden. Chain sutures in the left upper quadrant. No evidence of free air. Stable osseous structures. IMPRESSION: Gaseous small bowel distension with slight improvement from CT 2 days ago. Findings likely represent improving small bowel obstruction. Electronically Signed   By: Keith Rake M.D.   On: 04/30/2020 17:08   DG Abdomen Acute W/Chest  Result Date: 04/27/2020 CLINICAL DATA:  74 year old female with abdominal pain, nausea, atrial fibrillation with RVR. EXAM: DG ABDOMEN ACUTE W/ 1V CHEST COMPARISON:  CT Abdomen and Pelvis 04/19/2020 and earlier. FINDINGS: Portable AP upright view at 2336 hours. Increased elevation of the left hemidiaphragm from the CT last week. Normal cardiac size and mediastinal contours. Visualized tracheal air column is within normal limits. Increased pulmonary interstitial markings appear stable. Right chest Port-A-Cath which is accessed. No pneumothorax. No definite pleural effusion. Upright and supine views of the abdomen and pelvis. Increased gastric and small bowel air since the prior CT, including dilated 4 cm left lower quadrant small bowel loops. Previous partial colectomy, but little to no gas is identified in the residual large bowel. Superimposed mesenteric masses demonstrated by CT. No pneumoperitoneum identified. Stable visualized osseous structures. IMPRESSION: 1. Appearance suspicious for new small-bowel obstruction since the CT 04/19/2020, perhaps secondary to the mesenteric mass seen at that time. 2. No pneumoperitoneum identified. 3. New elevation of the left hemidiaphragm probably related to #1. No definite pleural fluid. Electronically Signed   By: Genevie Ann M.D.   On: 04/27/2020 23:56     Assessment and Recommendation  74 y.o. female with  known hypertension hyperlipidemia colon cancer with paroxysmal nonvalvular atrial fibrillation with rapid ventricular rate likely secondary to small bowel obstruction and left lower lobe pneumonia now well controlled on intravenous diltiazem now with atrial flutter with controlled ventricular rate well improving from current illness and no evidence of acute angina acute coronary syndrome or congestive heart failure 1.  Continue diltiazem drip for maintenance of normal sinus rhythm without any changes today 2.  Consideration of amiodarone drip if necessary if patient has significant progression of atrial fibrillation with rapid ventricular rate not responding to above but it appears that the patient is doing well with no need for additional treatment with amiodarone at this time 3.  No additional anticoagulation at this time other than deep venous thromboses prophylaxis and/or intravenous heparin due to concerns the patient may require surgical intervention 4.  No further cardiac diagnostics necessary at this time 5.  Continue full supportive care for small bowel obstruction and left lower lobe pneumonia  Signed, Serafina Royals M.D. FACC

## 2020-05-02 NOTE — Progress Notes (Signed)
Patient is followed by hospice for EOL, temporarily revoked for tumor treatment but will resume for EOL care

## 2020-05-02 NOTE — Progress Notes (Signed)
[place]  [phone] [phone]   Name: Kimberly [name] Date: 05/02/2020 MRN: [id number]  DOB: 1946-01-15  Patient Care Team: Birdie Sons, MD as PCP - General (Family Medicine) Earlie Server, MD as Consulting Physician (Oncology) Noreene Filbert, MD as Referring Physician (Radiation Oncology) Jules Husbands, MD as Consulting Physician (General Surgery) Corey Skains, MD as Consulting Physician (Cardiology)    REASON FOR CONSULTATION: Kimberly [name] is a 74 y.o. female with multiple medical problems including including metastatic colon cancer status post resection in 2018 with colostomy/reversal in 05/2018 and status post adjuvant FOLFOX. PET scan on 09/19/2019 revealed recurrent disease. Patient underwent CT-guided biopsy 08/20/2019 one of her abdominal wall mass revealing metastatic adenocarcinoma of the colon. Patient was hospitalized 12/08/2019-12/10/2019 with A. fib/RVR and was started on anticoagulation with Eliquis. On 12/24/2019 patient had anaphylactic reaction to cetuximab requiring intubation. Patient decided to forego further treatment. She was referred to hospice.  Patient developed hematuria in July 2021.  CT of abdomen/pelvis on 04/19/2020 revealed disease progression with new necrotic mass invading the bladder, increased nodularity in the right anterior abdominal wall, and peripheral enhancing lesion in the anterior uterine fundus suspicious for metastatic disease.  Patient's Eliquis was discontinued.  She was seen by urology and radiation oncology.  Patient ultimately revoked hospice to pursue XRT.  She subsequently was admitted to the hospital on 04/28/2020 with nausea and vomiting and found to have a partial SBO.  Palliative care was consulted help address goals..    CODE STATUS: DNR  PAST MEDICAL HISTORY: Past Medical History:  Diagnosis Date  . Breast cancer, left ([place]) 2004   Lumpectomy and rad tx's.   . Chronic back pain   . Colon cancer ([place]) 2018  . COPD (chronic obstructive pulmonary disease) ([place])   . Degenerative disc disease, lumbar 04/2013  . Family history of adverse reaction to anesthesia    son arrested after anesthesia  . GERD (gastroesophageal reflux disease)   . Hypertension   . Iron deficiency anemia 05/27/2017  . Personal history of chemotherapy 2018   Colon  . Personal history of radiation therapy    Breast 2004  . Personal history of radiation therapy 2018   Colon  . Postprocedural intraabdominal abscess 09/01/2018  . Small bowel obstruction ([place]) 04/16/2017  . Wears dentures    full upper    PAST SURGICAL HISTORY:  Past Surgical History:  Procedure Laterality Date  . APPENDECTOMY  1965  . BREAST EXCISIONAL BIOPSY Left 08/01/2003   lumpectomy rad 11/04-2/28/2005  . BREAST SURGERY    . CESAREAN SECTION     x3  . COLON SURGERY    . COLONOSCOPY W/ POLYPECTOMY    . COLONOSCOPY WITH PROPOFOL N/A 04/09/2018   Procedure: COLONOSCOPY WITH PROPOFOL;  Surgeon: Virgel Manifold, MD;  Location: [facility];  Service: Gastroenterology;  Laterality: N/A;  . COLONOSCOPY WITH PROPOFOL N/A 08/13/2019   Procedure: COLONOSCOPY WITH PROPOFOL;  Surgeon: Virgel Manifold, MD;  Location: [facility];  Service: Endoscopy;  Laterality: N/A;  . COLOSTOMY TAKEDOWN N/A 06/25/2018   Procedure: COLOSTOMY TAKEDOWN;  Surgeon: Jules Husbands, MD;  Location: [facility];  Service: General;  Laterality: N/A;  . ESOPHAGOGASTRODUODENOSCOPY (EGD) WITH PROPOFOL N/A 04/04/2017   Procedure: ESOPHAGOGASTRODUODENOSCOPY (EGD) WITH PROPOFOL;  Surgeon: [name], MD;  Location: [facility];  Service: Endoscopy;  Laterality: N/A;  . FRACTURE SURGERY    . HIP FRACTURE SURGERY Left 01/25/2012  .  LAPAROSCOPY N/A 12/07/2019   Procedure: LAPAROSCOPY DIAGNOSTIC;  Surgeon: Jules Husbands, MD;  Location: [facility];  Service: General;  Laterality: N/A;  . LAPAROTOMY N/A 04/15/2017    Procedure: EXPLORATORY LAPAROTOMY;  Surgeon: Clayburn Pert, MD;  Location: [facility];  Service: General;  Laterality: N/A;  . NECK SURGERY  12/2011  . PARASTOMAL HERNIA REPAIR N/A 12/03/2017   Procedure: HERNIA REPAIR PARASTOMAL;  Surgeon: Jules Husbands, MD;  Location: [facility];  Service: General;  Laterality: N/A;  . PARASTOMAL HERNIA REPAIR N/A 06/25/2018   Procedure: HERNIA REPAIR PARASTOMAL;  Surgeon: Jules Husbands, MD;  Location: [facility];  Service: General;  Laterality: N/A;  . PORTACATH PLACEMENT Right 05/21/2017   Procedure: INSERTION PORT-A-CATH;  Surgeon: Clayburn Pert, MD;  Location: [facility];  Service: General;  Laterality: Right;  . PORTACATH PLACEMENT Right 12/07/2019   Procedure: INSERTION PORT-A-CATH;  Surgeon: Jules Husbands, MD;  Location: [facility];  Service: General;  Laterality: Right;  . WRIST FRACTURE SURGERY      HEMATOLOGY/ONCOLOGY HISTORY:  Oncology History  Malignant neoplasm of colon ([place])  04/26/2017 Initial Diagnosis   Malignant neoplasm of transverse colon ([place])   11/06/2019 - 11/06/2019 Chemotherapy   The patient had palonosetron (ALOXI) injection 0.25 mg, 0.25 mg, Intravenous,  Once, 0 of 4 cycles irinotecan (CAMPTOSAR) 320 mg in sodium chloride 0.9 % 500 mL chemo infusion, 180 mg/m2, Intravenous,  Once, 0 of 4 cycles fluorouracil (ADRUCIL) chemo injection 750 mg, 400 mg/m2, Intravenous,  Once, 0 of 4 cycles fluorouracil (ADRUCIL) 4,350 mg in sodium chloride 0.9 % 63 mL chemo infusion, 2,400 mg/m2, Intravenous, 1 Day/Dose, 0 of 4 cycles bevacizumab-bvzr (ZIRABEV) 400 mg in sodium chloride 0.9 % 100 mL chemo infusion, 5 mg/kg, Intravenous,  Once, 0 of 4 cycles leucovorin 728 mg in sodium chloride 0.9 % 250 mL infusion, 400 mg/m2, Intravenous,  Once, 0 of 4 cycles  for chemotherapy treatment.    12/24/2019 - 12/24/2019 Chemotherapy   The patient had cetuximab (ERBITUX) chemo infusion 700 mg, 400 mg/m2 = 700 mg, Intravenous,  Once, 1 of 4 cycles Administration:  700 mg (12/24/2019)  for chemotherapy treatment.    01/07/2020 -  Chemotherapy   The patient had panitumumab (VECTIBIX) 400 mg in sodium chloride 0.9 % 100 mL chemo infusion, 6 mg/kg = 400 mg, Intravenous,  Once, 1 of 1 cycle Administration: 400 mg (01/07/2020) panitumumab (VECTIBIX) 400 mg in sodium chloride 0.9 % 100 mL chemo infusion, 6 mg/kg, Intravenous,  Once, 0 of 3 cycles  for chemotherapy treatment.      ALLERGIES:  is allergic to erbitux [cetuximab], betadine [povidone iodine], iodine, other, and sinus formula [cholestatin].  MEDICATIONS:  Current Facility-Administered Medications  Medication Dose Route Frequency Provider Last Rate Last Admin  . 0.9 % NaCl with KCl 20 mEq/ L  infusion   Intravenous Continuous Nicole Kindred A, DO 75 mL/hr at 05/02/20 1001 New Bag at 05/02/20 1001  . albuterol (PROVENTIL) (2.5 MG/3ML) 0.083% nebulizer solution 2.5 mg  2.5 mg Inhalation Q4H PRN Opyd, Ilene Qua, MD      . azithromycin (ZITHROMAX) 500 mg in sodium chloride 0.9 % 250 mL IVPB  500 mg Intravenous Q24H Nicole Kindred A, DO 250 mL/hr at 05/01/20 1349 500 mg at 05/01/20 1349  . calcium carbonate (TUMS - dosed in mg elemental calcium) chewable tablet 200 mg of elemental calcium  1 tablet Oral TID PRN Sharion Settler, NP      . cefTRIAXone (ROCEPHIN) 1 g in sodium  chloride 0.9 % 100 mL IVPB  1 g Intravenous Q24H Nicole Kindred A, DO 200 mL/hr at 05/01/20 1347 1 g at 05/01/20 1347  . Chlorhexidine Gluconate Cloth 2 % PADS 6 each  6 each Topical Daily Max Sane, MD   6 each at 05/01/20 [phone]  . dextromethorphan-guaiFENesin (MUCINEX DM) 30-600 MG per 12 hr tablet 1 tablet  1 tablet Oral BID Nicole Kindred A, DO   1 tablet at 05/01/20 2116  . diltiazem (CARDIZEM) 125 mg in dextrose 5% 125 mL (1 mg/mL) infusion  5-15 mg/hr Intravenous Continuous Opyd, Ilene Qua, MD 5 mL/hr at 05/02/20 1003 5 mg/hr at 05/02/20 1003  . fluticasone (FLONASE) 50 MCG/ACT nasal spray 2 spray  2 spray Each Nare Daily  Sharion Settler, NP   2 spray at 05/02/20 1000  . LORazepam (ATIVAN) injection 0.5 mg  0.5 mg Intravenous Q4H PRN Sharion Settler, NP   0.5 mg at 04/30/20 1239  . mometasone-formoterol (DULERA) 100-5 MCG/ACT inhaler 2 puff  2 puff Inhalation BID Opyd, Ilene Qua, MD   2 puff at 05/02/20 1000  . morphine 2 MG/ML injection 2 mg  2 mg Intravenous Q2H PRN Sharion Settler, NP   2 mg at 05/02/20 1007  . ondansetron (ZOFRAN) tablet 4 mg  4 mg Oral Q6H PRN Opyd, Ilene Qua, MD       Or  . ondansetron (ZOFRAN) injection 4 mg  4 mg Intravenous Q6H PRN Opyd, Ilene Qua, MD   4 mg at 05/02/20 0413  . pantoprazole (PROTONIX) injection 40 mg  40 mg Intravenous Q24H Sharion Settler, NP   40 mg at 05/02/20 0439  . phenol (CHLORASEPTIC) mouth spray 1 spray  1 spray Mouth/Throat PRN Nicole Kindred A, DO      . sodium chloride flush (NS) 0.9 % injection 10-40 mL  10-40 mL Intracatheter Q12H Nicole Kindred A, DO      . sodium chloride flush (NS) 0.9 % injection 10-40 mL  10-40 mL Intracatheter PRN Nicole Kindred A, DO      . sodium chloride flush (NS) 0.9 % injection 3 mL  3 mL Intravenous Q12H Opyd, Ilene Qua, MD   3 mL at 04/29/20 2031    VITAL SIGNS: BP (!) 111/58 (BP Location: Right Arm)   Pulse 73   Temp 98.1 F (36.7 C) (Oral)   Resp 13   Ht 5\' 4"  (1.626 m)   Wt 150 lb (68 kg)   SpO2 100%   BMI 25.75 kg/m  Filed Weights   04/28/20 1635 04/29/20 0449 05/02/20 0458  Weight: 151 lb 3.2 oz (68.6 kg) 148 lb 4.8 oz (67.3 kg) 150 lb (68 kg)    Estimated body mass index is 25.75 kg/m as calculated from the following:   Height as of this encounter: 5\' 4"  (1.626 m).   Weight as of this encounter: 150 lb (68 kg).  LABS: CBC:    Component Value Date/Time   WBC 12.1 (H) 05/02/2020 0516   HGB 7.9 (L) 05/02/2020 0516   HGB 12.2 01/02/2017 1410   HCT 25.6 (L) 05/02/2020 0516   HCT 37.3 01/02/2017 1410   PLT 423 (H) 05/02/2020 0516   PLT 486 (H) 01/02/2017 1410   MCV 81.0 05/02/2020 0516   MCV 86  01/02/2017 1410   MCV 96 01/29/2012 0428   NEUTROABS 11.3 (H) 04/27/2020 2335   NEUTROABS 7.8 (H) 01/02/2017 1410   NEUTROABS 9.2 (H) 01/29/2012 0428   LYMPHSABS 1.0 04/27/2020 2335   LYMPHSABS 3.9 (  H) 01/02/2017 1410   LYMPHSABS 1.9 01/29/2012 0428   MONOABS 1.9 (H) 04/27/2020 2335   MONOABS 2.0 (H) 01/29/2012 0428   EOSABS 0.0 04/27/2020 2335   EOSABS 0.4 01/02/2017 1410   EOSABS 0.1 01/29/2012 0428   BASOSABS 0.1 04/27/2020 2335   BASOSABS 0.1 01/02/2017 1410   BASOSABS 0.1 01/29/2012 0428   Comprehensive Metabolic Panel:    Component Value Date/Time   NA 134 (L) 05/02/2020 0516   NA 141 01/02/2017 1410   NA 136 01/27/2012 0637   K 3.9 05/02/2020 0516   K 4.3 01/27/2012 0637   CL 98 05/02/2020 0516   CL 102 01/27/2012 0637   CO2 27 05/02/2020 0516   CO2 25 01/27/2012 0637   BUN 7 (L) 05/02/2020 0516   BUN 14 01/02/2017 1410   BUN 15 01/27/2012 0637   CREATININE 0.50 05/02/2020 0516   CREATININE 0.67 01/27/2012 0637   GLUCOSE 84 05/02/2020 0516   GLUCOSE 119 (H) 01/27/2012 0637   CALCIUM 8.2 (L) 05/02/2020 0516   CALCIUM 7.7 (L) 01/27/2012 0637   AST 12 (L) 04/27/2020 2335   AST 17 01/26/2012 0021   ALT 8 04/27/2020 2335   ALT 19 01/26/2012 0021   ALKPHOS 66 04/27/2020 2335   ALKPHOS 87 01/26/2012 0021   BILITOT 0.8 04/27/2020 2335   BILITOT <0.2 01/02/2017 1410   BILITOT 0.2 01/26/2012 0021   PROT 6.5 04/27/2020 2335   PROT 7.0 01/02/2017 1410   PROT 7.6 01/26/2012 0021   ALBUMIN 3.0 (L) 04/27/2020 2335   ALBUMIN 3.9 01/02/2017 1410   ALBUMIN 3.5 01/26/2012 0021    RADIOGRAPHIC STUDIES: CT Abdomen Pelvis W Wo Contrast  Result Date: 04/19/2020 CLINICAL DATA:  Painless hematuria for 5 days. History of colon cancer with colostomy and reversal. EXAM: CT ABDOMEN AND PELVIS WITHOUT AND WITH CONTRAST TECHNIQUE: Multidetector CT imaging of the abdomen and pelvis was performed following the standard protocol before and following the bolus administration of  intravenous contrast. CONTRAST:  136mL OMNIPAQUE IOHEXOL 300 MG/ML  SOLN COMPARISON:  Abdominopelvic CT 12/08/2019. FINDINGS: Lower chest: Stable linear scarring in the right lower lobe and mild chronic central airway thickening at both lung bases. Hepatobiliary: The liver is normal in density without suspicious focal abnormality. No evidence of gallstones, gallbladder wall thickening or biliary dilatation. Pancreas: Unremarkable. No pancreatic ductal dilatation or surrounding inflammatory changes. Spleen: Normal in size without focal abnormality. Adrenals/Urinary Tract: There are stable low-density adrenal masses consistent with adenomas. These measure up to 2.7 cm on the right and 5.3 cm on the left. Pre-contrast images demonstrate no renal, ureteral or bladder calculi. Post-contrast, both kidneys enhance normally. There is no evidence of enhancing renal mass. Delayed images result in segmental visualization of the ureters. No focal upper tract urothelial abnormalities are identified. New abnormal tenting of the bladder superiorly on the right with an adjacent ill-defined low-density mass with peripheral enhancement measuring up to 4.8 x 3.5 cm on image 61/5. This is inferior to the ileocolonic anastomosis and is worrisome for local recurrence of colon cancer with central necrosis. This mass may invade the bladder. There is mild bladder wall thickening without other focal bladder abnormality. Stomach/Bowel: Stable mild diffuse gastric wall thickening. No small bowel distension or surrounding inflammation. Status post right hemicolectomy with ileocolonic anastomosis. There is an additional anastomosis in the distal transverse colon. No evidence of bowel obstruction. Vascular/Lymphatic: As above, new centrally necrotic mass in the right lower quadrant worrisome for local recurrence of colon cancer or metastatic  adenopathy. There are no enlarged retroperitoneal lymph nodes. Diffuse aortic and branch vessel  atherosclerosis. The portal, superior mesenteric and splenic veins are patent. Reproductive: Peripherally enhancing lesion along the anterior uterine fundus measures up to 2.9 x 2.1 cm on image 59/11. This could reflect a uterine fibroid, although is not seen on previous studies, and could reflect a metastasis given the other findings. No adnexal mass. Other: In addition to the right lower quadrant centrally necrotic lesion described above, there is a small spiculated lesion posterior to the ileocolonic anastomosis measuring 12 mm on image 50/5. There is increased nodularity in the right anterior abdominal wall measuring 3.2 x 2.3 cm on image 39/5. No ascites or generalized peritoneal nodularity. Musculoskeletal: No acute or significant osseous findings. Previous left hip ORIF. IMPRESSION: 1. New centrally necrotic mass in the right lower quadrant with abnormal tenting of the bladder and possible bladder invasion, worrisome for local recurrence of colon cancer or metastatic adenopathy. At least one other mildly enlarged lymph node is noted. 2. Increased nodularity in the right anterior abdominal wall, suspicious for metastatic disease. 3. Peripherally enhancing lesion in the anterior uterine fundus suspicious for metastatic disease given nonvisualization on previous imaging. 4. No evidence of urinary tract calculus, renal mass or hydronephrosis. 5. Stable bilateral adrenal adenomas. 6. Aortic Atherosclerosis (ICD10-I70.0). 7. These results will be called to the ordering clinician or representative by the Radiologist Assistant, and communication documented in the PACS or [employer]. Electronically Signed   By: Richardean Sale M.D.   On: 04/19/2020 16:16   CT ABDOMEN PELVIS WO CONTRAST  Result Date: 04/28/2020 CLINICAL DATA:  Nonlocalized abdominal pain with nausea EXAM: CT ABDOMEN AND PELVIS WITHOUT CONTRAST TECHNIQUE: Multidetector CT imaging of the abdomen and pelvis was performed following the standard  protocol without IV contrast. COMPARISON:  None. FINDINGS: Lower chest: The visualized heart size within normal limits. No pericardial fluid/thickening. No hiatal hernia. Patchy airspace opacities seen at the posterior left lung base. Hepatobiliary: Although limited due to the lack of intravenous contrast, normal in appearance without gross focal abnormality. No evidence of calcified gallstones or biliary ductal dilatation. Pancreas:  Unremarkable.  No surrounding inflammatory changes. Spleen: Normal in size. Although limited due to the lack of intravenous contrast, normal in appearance. Adrenals/Urinary Tract: Both adrenal glands appear normal. The kidneys and collecting system appear normal without evidence of urinary tract calculus or hydronephrosis. Bladder is unremarkable. Stomach/Bowel: There is a mildly dilated fluid and food stuff filled stomach. Dilated air and fluid-filled loops small bowel are seen throughout the mid abdomen measuring up to 4.5 cm down to the level of the distal ileum within the right lower quadrant where there is a gradual area of narrowing which is not well seen. The distal ileal loops and terminal ileum are decompressed. There is been a prior partial right colectomy with surgical sutures noted. A moderate amount of colonic stool is seen throughout. Scattered colonic diverticula are seen. Vascular/Lymphatic: There are no enlarged abdominal or pelvic lymph nodes. Scattered aortic atherosclerotic calcifications are seen without aneurysmal dilatation. Reproductive: The deep pelvis is unremarkable. Other: No evidence of abdominal wall mass or hernia. Musculoskeletal: No acute or significant osseous findings. IMPRESSION: Findings consistent with a partial small bowel obstruction down to the level of the right lower abdomen where there is a gradual area of narrowing within the distal ileal loops. No definite focal transition point however is noted. Aortic Atherosclerosis (ICD10-I70.0).  Electronically Signed   By: Prudencio Pair M.D.   On:  04/28/2020 01:08   DG Chest 1 View  Result Date: 04/29/2020 CLINICAL DATA:  74 year old currently being treated for metastatic colon cancer, presenting with hypoxia. Former smoker with COPD. EXAM: PORTABLE CHEST 1 VIEW COMPARISON:  01/10/2020. FINDINGS: RIGHT jugular Port-A-Cath tip projects over the LOWER SVC, unchanged. Nasogastric tube tip projects over the fundus of the stomach. Cardiac silhouette normal in size, unchanged. Airspace consolidation involving the LEFT LOWER LOBE, silhouetting the LEFT hemidiaphragm. Lungs otherwise clear. Emphysematous changes throughout both lungs, unchanged. Probable small to moderate-sized LEFT pleural effusion. No visible RIGHT pleural effusion. IMPRESSION: 1. Acute LEFT LOWER LOBE pneumonia. 2. Probable small to moderate-sized LEFT pleural effusion. Electronically Signed   By: Evangeline Dakin M.D.   On: 04/29/2020 09:03   DG Abd 1 View  Result Date: 05/02/2020 CLINICAL DATA:  Bowel obstruction EXAM: ABDOMEN - 1 VIEW COMPARISON:  April 30, 2020 FINDINGS: Nasogastric tube tip and side port in proximal stomach. There remain loops of mildly dilated small bowel, similar to recent prior study. No air-fluid levels. No evident free air. There is atelectatic change in the visualized left lung base. Postoperative change noted in the proximal left femur. Occasional vascular calcifications noted in the pelvis. IMPRESSION: Nasogastric tube tip and side port in proximal stomach. Mild small bowel dilatation in the left abdomen persists. No air-fluid level. No free air. Suspect that there is a residual degree of bowel obstruction. Electronically Signed   By: Lowella Grip III M.D.   On: 05/02/2020 08:00   DG Abd 1 View  Result Date: 04/30/2020 CLINICAL DATA:  Small bowel obstruction. EXAM: ABDOMEN - 1 VIEW COMPARISON:  Abdominal CT 04/28/2020 FINDINGS: Gaseous small bowel distention in the left abdomen with greatest small  bowel diameter of 3.7 cm. The degree of small-bowel distention appears slightly improved from prior. Decreased stool burden. Chain sutures in the left upper quadrant. No evidence of free air. Stable osseous structures. IMPRESSION: Gaseous small bowel distension with slight improvement from CT 2 days ago. Findings likely represent improving small bowel obstruction. Electronically Signed   By: Keith Rake M.D.   On: 04/30/2020 17:08   DG Abdomen Acute W/Chest  Result Date: 04/27/2020 CLINICAL DATA:  74 year old female with abdominal pain, nausea, atrial fibrillation with RVR. EXAM: DG ABDOMEN ACUTE W/ 1V CHEST COMPARISON:  CT Abdomen and Pelvis 04/19/2020 and earlier. FINDINGS: Portable AP upright view at 2336 hours. Increased elevation of the left hemidiaphragm from the CT last week. Normal cardiac size and mediastinal contours. Visualized tracheal air column is within normal limits. Increased pulmonary interstitial markings appear stable. Right chest Port-A-Cath which is accessed. No pneumothorax. No definite pleural effusion. Upright and supine views of the abdomen and pelvis. Increased gastric and small bowel air since the prior CT, including dilated 4 cm left lower quadrant small bowel loops. Previous partial colectomy, but little to no gas is identified in the residual large bowel. Superimposed mesenteric masses demonstrated by CT. No pneumoperitoneum identified. Stable visualized osseous structures. IMPRESSION: 1. Appearance suspicious for new small-bowel obstruction since the CT 04/19/2020, perhaps secondary to the mesenteric mass seen at that time. 2. No pneumoperitoneum identified. 3. New elevation of the left hemidiaphragm probably related to #1. No definite pleural fluid. Electronically Signed   By: Genevie Ann M.D.   On: 04/27/2020 23:56    PERFORMANCE STATUS (ECOG) : 3 - Symptomatic, >50% confined to bed  Review of Systems Unless otherwise noted, a complete review of systems is  negative.  Physical Exam General: NAD, frail appearing  Pulmonary: Unlabored Extremities: no edema, no joint deformities Skin: no rashes Neurological: Weakness but otherwise nonfocal  IMPRESSION: Abdominal x-ray today shows possible residual SBO.  No significant changes overnight.  Patient continues to require nonrebreather 10 L, although it is possible that this could be weaned some his current SaO2 is 95%.  Patient is considering her goals. I recommended consideration of either home with hospice vs hospice facility if there is not significant improvement with her oxygenation/SBO.   PLAN: -Continue best supportive care -RT to wean oxygen as able -Recommend hospice either at home or hospice facility    Time Total: 15 minutes  Visit consisted of counseling and education dealing with the complex and emotionally intense issues of symptom management and palliative care in the setting of serious and potentially life-threatening illness.Greater than 50%  of this time was spent counseling and coordinating care related to the above assessment and plan.  Signed by: Altha Harm, PhD, NP-C

## 2020-05-02 NOTE — Progress Notes (Signed)
PROGRESS NOTE    Kimberly [name]   ZHG:[id number]  DOB: 1946-05-11  PCP: Birdie Sons, MD    DOA: 04/27/2020 LOS: 4   Brief Narrative   Kimberly [name] is a 74 y.o. female with medical history significant for metastatic colon cancer who was on hospice but revoked on 7/29 to pursue palliative radiation therapy, COPD with chronic hypoxic respiratory failure, hypertension, and atrial fibrillation, presented to the ED on 04/28/20 with abdominal pain, nausea, and vomiting which had progressive over a week. Was started on lactulose for constipation as outpatient, but got worse, prompting EMS to be called.  EMS found patient to be in A-fib with RVR (HR 180).  Her anticoagulation was discontinued recently due to hematuria due to tumor invasion of her urinary bladder.  In the ED, O2 sat mid 90's on 2 L/min oxygen, HR 150's, stable BP.  EKG showed A-fib with RVR 159 bpm and repolarization abnormality.  Labs notable for mild hypnatremia, stable chronic leukocytosis, thrombocytosis and improved normocytic anemia.  CT abdomen/pelvis concerning for small bowel obstruction.  Started on Cardizem gtt in the ED.   Admitted to hospitalist service.  General surgery is consulted, recommends conservative management of her SBO, with NG tube for decompression, NPO and IV fluids.  Unfortunately, patient is not a surgical candidate due to her advanced metastatic disease.     Assessment & Plan   Principal Problem:   Atrial fibrillation with RVR (HCC) Active Problems:   SBO (small bowel obstruction) (HCC)   Malignant neoplasm of colon (HCC)   Chronic obstructive pulmonary disease (HCC)   Chronic respiratory failure with hypoxia (HCC)   Sepsis secondary to community-acquired pneumonia -sepsis present on admission with tachycardia, worsened hypoxia, and left lower lobe infiltrate on x-ray of 7/31.  Initially thought to be SIRS at time of admission.  She has EF of 50 to 55% so will defer 30 mL/kg fluid bolus, now on  gentle maintenance fluid.  Monitor closely.  Lactic acidosis resolved.   Continue Rocephin and Zithromax.  Blood cultures pending.  Sputum culture, strep and Legionella urinary antigens.  Mucinex scheduled.  Acute on chronic respiratory failure with hypoxia -multifactorial, due to A. fib with RVR and pneumonia.  Continue supplemental oxygen as needed to keep O2 sat 90% or greater, wean as tolerated.  Remains on 10 L/min NRB mask this morning with sats in low-mid 90's.  A. fib with RVR -history of paroxysmal A. fib.  Recently taken off Eliquis due to hematuria secondary to metastatic invasion of her bladder.  Cardiology consulted.  On Cardizem drip with HR controlled.    Small bowel obstruction -present on admission with abdominal pain, nausea, vomiting and CT findings showing obstruction.  Secondary to recurrent colon cancer.  General surgery is consulted.  NPO.  NG tube decompression.  Pain control and antiemetics as needed.  KUB 8/1 showed mild improvement.  Is passing little gas.  Left lower lobe pneumonia - present on admission, seen on chest x-ray ordered early this morning by night provider.  No prior chest imaging this admission.  Started on IV Rocephin and Zithromax.  Incentive spirometer.  Mucinex.  Hypokalemia - on NS+20 mEq K.  4 IVPB's today for 40 addl'l mEq's.    Daily BMP to monitor, check Mg level.  Replace as needed.  Maintain K> 4.0 and Mg> 2.0 given A-fib.  Metastatic colon cancer - please reference most recent palliative notes of 7/30 for details of history.  Patient had been on hospice  care but revoked 2 days prior to admission to pursue palliative radiation therapy.  Palliative following.  Supportive care. Discussed case with Billey Chang, NP with cancer center palliative care team today (8/3).  He had discussed with oncologist / rad-onc and patient unlikely to be candidate for radiation as had been planned. They are recommending hospice.   Hematuria -due to tumor invasion of  bladder.  Monitor closely Daily CBC.  SCDs for DVT prophylaxis.  COPD -on home oxygen.  Not acutely exacerbated, no wheezing on exam.  Treating for pneumonia which could exacerbate so we will monitor closely.  Continue supplemental oxygen, bronchodilators per orders.  Chronic anemia -likely chronic disease.  Stable upon admission.  Monitor CBC.  Monitor for signs of bleeding.  She does have hematuria due to tumor invasion into her bladder.  SCDs for DVT prophylaxis.  Essential hypertension -currently with borderline BP on Cardizem infusion for A. fib RVR.  Home meds on hold while n.p.o.    Patient BMI: Body mass index is 25.75 kg/m.   DVT prophylaxis: SCDs Start: 04/28/20 0350 SCDs Start: 04/28/20 0350   Diet:  Diet Orders (From admission, onward)    Start     Ordered   04/28/20 0350  Diet NPO time specified  Diet effective now        04/28/20 0349            Code Status: DNR    Subjective 05/02/20    Patient seen bedside this morning.  She reports feeling better.  Says her breathing feels improved.  No fevers or chills.  She is belching but not having any flatulence.  No other acute complaints.  Disposition Plan & Communication   Status is: Inpatient  Remains inpatient appropriate because:Inpatient level of care appropriate due to severity of illness.     Dispo: The patient is from: Home              Anticipated d/c is to: Home vs Hospice              Anticipated d/c date is: > 3 days              Patient currently is not medically stable to d/c.   Family Communication: Daughter updated by phone this afternoon.  Discussed SBO not resolving, unlikely to resolve at it's due to mass compressing on her bowel, and she is not candidate for surgery.     Consults, Procedures, Significant Events   Consultants:   Cardiology  Palliative care  Procedures:   None  Antimicrobials:   Rocephin and Zithromax 7/31 >>    Objective   Vitals:   05/02/20 1430 05/02/20  1445 05/02/20 1500 05/02/20 1515  BP: 107/71 101/70 (!) 114/56 (!) 109/58  Pulse: (!) 157 (!) 158 78 72  Resp: (!) 25 13 17 12   Temp: 98.4 F (36.9 C) 98.4 F (36.9 C)    TempSrc: Axillary     SpO2: 99% 99% 93% 100%  Weight:      Height:        Intake/Output Summary (Last 24 hours) at 05/02/2020 1543 Last data filed at 05/02/2020 1121 Gross per 24 hour  Intake 944.64 ml  Output 3850 ml  Net -2905.36 ml   Filed Weights   04/28/20 1635 04/29/20 0449 05/02/20 0458  Weight: 68.6 kg 67.3 kg 68 kg    Physical Exam:  General exam: awake, alert, no acute distress HEENT: NG tube in left nare, hearing grossly normal, wearing NRB  mask Respiratory system: Left-sided rhonchi, right side is clear, normal respiratory effort, on 10 L/min supplemental O2 by NRB mask. Cardiovascular system: normal S1/S2, RRR this AM, no pedal edema.   Gastrointestinal system: soft, NT, ND, very minimal bowel sounds Neuro: A&O x4, normal speech, no gross focal motor deficits Psychiatry: normal mood, congruent affect, judgement and insight appear normal  Labs   Data Reviewed: I have personally reviewed following labs and imaging studies  CBC: Recent Labs  Lab 04/27/20 2335 04/28/20 0622 04/28/20 2254 04/29/20 0516 04/30/20 0526 05/01/20 0405 05/02/20 0516  WBC 14.4*   < > 15.3* 17.8* 18.2* 15.1* 12.1*  NEUTROABS 11.3*  --   --   --   --   --   --   HGB 8.6*   < > 8.5* 8.2* 8.1* 7.5* 7.9*  HCT 27.3*   < > 28.5* 27.2* 27.1* 25.5* 25.6*  MCV 81.3   < > 82.8 82.2 82.1 82.3 81.0  PLT 571*   < > 527* 474* 459* 444* 423*   < > = values in this interval not displayed.   Basic Metabolic Panel: Recent Labs  Lab 04/28/20 0622 04/29/20 0516 04/30/20 0526 05/01/20 0405 05/02/20 0516  NA 134* 131* 135 134* 134*  K 3.7 3.1* 3.7 3.4* 3.9  CL 97* 91* 97* 100 98  CO2 26 25 27 27 27   GLUCOSE 111* 101* 93 84 84  BUN 20 15 15 9  7*  CREATININE 0.66 0.64 0.56 0.42* 0.50  CALCIUM 7.8* 7.6* 7.9* 7.8* 8.2*  MG   --   --  1.9 1.9 2.0   GFR: Estimated Creatinine Clearance: 60.2 mL/min (by C-G formula based on SCr of 0.5 mg/dL). Liver Function Tests: Recent Labs  Lab 04/27/20 2335  AST 12*  ALT 8  ALKPHOS 66  BILITOT 0.8  PROT 6.5  ALBUMIN 3.0*   No results for input(s): LIPASE, AMYLASE in the last 168 hours. No results for input(s): AMMONIA in the last 168 hours. Coagulation Profile: No results for input(s): INR, PROTIME in the last 168 hours. Cardiac Enzymes: No results for input(s): CKTOTAL, CKMB, CKMBINDEX, TROPONINI in the last 168 hours. BNP (last 3 results) No results for input(s): PROBNP in the last 8760 hours. HbA1C: No results for input(s): HGBA1C in the last 72 hours. CBG: No results for input(s): GLUCAP in the last 168 hours. Lipid Profile: No results for input(s): CHOL, HDL, LDLCALC, TRIG, CHOLHDL, LDLDIRECT in the last 72 hours. Thyroid Function Tests: No results for input(s): TSH, T4TOTAL, FREET4, T3FREE, THYROIDAB in the last 72 hours. Anemia Panel: No results for input(s): VITAMINB12, FOLATE, FERRITIN, TIBC, IRON, RETICCTPCT in the last 72 hours. Sepsis Labs: Recent Labs  Lab 04/29/20 2039  LATICACIDVEN 0.8    Recent Results (from the past 240 hour(s))  SARS Coronavirus 2 by RT PCR (hospital order, performed in [facility] hospital lab) Nasopharyngeal Nasopharyngeal Swab     Status: None   Collection Time: 04/28/20  2:51 AM   Specimen: Nasopharyngeal Swab  Result Value Ref Range Status   SARS Coronavirus 2 NEGATIVE NEGATIVE Final    Comment: (NOTE) SARS-CoV-2 target nucleic acids are NOT DETECTED.  The SARS-CoV-2 RNA is generally detectable in upper and lower respiratory specimens during the acute phase of infection. The lowest concentration of SARS-CoV-2 viral copies this assay can detect is 250 copies / mL. A negative result does not preclude SARS-CoV-2 infection and should not be used as the sole basis for treatment or other patient management decisions.  A negative result may occur with improper specimen collection / handling, submission of specimen other than nasopharyngeal swab, presence of viral mutation(s) within the areas targeted by this assay, and inadequate number of viral copies (<250 copies / mL). A negative result must be combined with clinical observations, patient history, and epidemiological information.  Fact Sheet for Patients:   StrictlyIdeas.no  Fact Sheet for Healthcare Providers: BankingDealers.co.za  This test is not yet approved or  cleared by the [place] FDA and has been authorized for detection and/or diagnosis of SARS-CoV-2 by FDA under an Emergency Use Authorization (EUA).  This EUA will remain in effect (meaning this test can be used) for the duration of the COVID-19 declaration under Section 564(b)(1) of the Act, 21 U.S.C. section 360bbb-3(b)(1), unless the authorization is terminated or revoked sooner.  Performed at [facility], [address] [place], [place] [address]   CULTURE, BLOOD (ROUTINE X 2) w Reflex to ID Panel     Status: None (Preliminary result)   Collection Time: 04/29/20  8:39 PM   Specimen: BLOOD  Result Value Ref Range Status   Specimen Description BLOOD BLOOD RIGHT HAND  Final   Special Requests   Final    BOTTLES DRAWN AEROBIC ONLY Blood Culture results may not be optimal due to an inadequate volume of blood received in culture bottles   Culture   Final    NO GROWTH 3 DAYS Performed at [facility], [address] [place], [place] [address]    Report Status PENDING  Incomplete  CULTURE, BLOOD (ROUTINE X 2) w Reflex to ID Panel     Status: None (Preliminary result)   Collection Time: 04/29/20  8:57 PM   Specimen: BLOOD  Result Value Ref Range Status   Specimen Description BLOOD BLOOD LEFT HAND  Final   Special Requests   Final    BOTTLES DRAWN AEROBIC AND ANAEROBIC Blood Culture adequate volume    Culture   Final    NO GROWTH 3 DAYS Performed at [facility], [address] [place], [place] [address]    Report Status PENDING  Incomplete  Expectorated sputum assessment w rflx to resp cult     Status: None   Collection Time: 04/30/20  5:34 PM   Specimen: Sputum  Result Value Ref Range Status   Specimen Description SPUTUM  Final   Special Requests NONE  Final   Sputum evaluation   Final    Sputum specimen not acceptable for testing.  Please recollect.   RESULT CALLED TO, READ BACK BY AND VERIFIED WITH: MARCEL TURNER ON 04/30/2020 [id number] Performed at [facility], [address] [place], [place] [address]    Report Status 04/30/2020 FINAL  Final      Imaging Studies   DG Abd 1 View  Result Date: 05/02/2020 CLINICAL DATA:  Bowel obstruction EXAM: ABDOMEN - 1 VIEW COMPARISON:  April 30, 2020 FINDINGS: Nasogastric tube tip and side port in proximal stomach. There remain loops of mildly dilated small bowel, similar to recent prior study. No air-fluid levels. No evident free air. There is atelectatic change in the visualized left lung base. Postoperative change noted in the proximal left femur. Occasional vascular calcifications noted in the pelvis. IMPRESSION: Nasogastric tube tip and side port in proximal stomach. Mild small bowel dilatation in the left abdomen persists. No air-fluid level. No free air. Suspect that there is a residual degree of bowel obstruction. Electronically Signed   By: Lowella Grip III M.D.  On: 05/02/2020 08:00   DG Abd 1 View  Result Date: 04/30/2020 CLINICAL DATA:  Small bowel obstruction. EXAM: ABDOMEN - 1 VIEW COMPARISON:  Abdominal CT 04/28/2020 FINDINGS: Gaseous small bowel distention in the left abdomen with greatest small bowel diameter of 3.7 cm. The degree of small-bowel distention appears slightly improved from prior. Decreased stool burden. Chain sutures in the left upper quadrant. No evidence of free air. Stable osseous  structures. IMPRESSION: Gaseous small bowel distension with slight improvement from CT 2 days ago. Findings likely represent improving small bowel obstruction. Electronically Signed   By: Keith Rake M.D.   On: 04/30/2020 17:08     Medications   Scheduled Meds: . Chlorhexidine Gluconate Cloth  6 each Topical Daily  . dextromethorphan-guaiFENesin  1 tablet Oral BID  . fluticasone  2 spray Each Nare Daily  . mometasone-formoterol  2 puff Inhalation BID  . pantoprazole (PROTONIX) IV  40 mg Intravenous Q24H  . sodium chloride flush  10-40 mL Intracatheter Q12H  . sodium chloride flush  3 mL Intravenous Q12H   Continuous Infusions: . 0.9 % NaCl with KCl 20 mEq / L 75 mL/hr at 05/02/20 1001  . azithromycin 500 mg (05/02/20 1532)  . cefTRIAXone (ROCEPHIN)  IV 1 g (05/02/20 1456)  . diltiazem (CARDIZEM) infusion 15 mg/hr (05/02/20 1448)       LOS: 4 days    Time spent: 36 minutes with > 50% spent in coordination of care and direct patient contact.    Ezekiel Slocumb, DO [facility]  05/02/2020, 3:43 PM    If 7PM-7AM, please contact night-coverage. How to contact the [facility] Attending or Consulting provider [place] or covering provider during after hours [place], for this patient?    1. Check the care team in [facility] and look for a) attending/consulting [facility] provider listed and b) the [facility] team listed 2. Log into [email or web address] and use [place]'s universal password to access. If you do not have the password, please contact the hospital operator. 3. Locate the [facility] provider you are looking for under [facility] and page to a number that you can be directly reached. 4. If you still have difficulty reaching the provider, please page the [facility] (Director on Call) for the Hospitalists listed on amion for assistance.

## 2020-05-03 ENCOUNTER — Inpatient Hospital Stay: Payer: Medicare Other

## 2020-05-03 ENCOUNTER — Encounter: Payer: Self-pay | Admitting: Family Medicine

## 2020-05-03 LAB — CBC WITH DIFFERENTIAL/PLATELET
Abs Immature Granulocytes: 0.05 10*3/uL (ref 0.00–0.07)
Basophils Absolute: 0.1 10*3/uL (ref 0.0–0.1)
Basophils Relative: 0 %
Eosinophils Absolute: 0.4 10*3/uL (ref 0.0–0.5)
Eosinophils Relative: 3 %
HCT: 28.1 % — ABNORMAL LOW (ref 36.0–46.0)
Hemoglobin: 8.2 g/dL — ABNORMAL LOW (ref 12.0–15.0)
Immature Granulocytes: 0 %
Lymphocytes Relative: 9 %
Lymphs Abs: 1.3 10*3/uL (ref 0.7–4.0)
MCH: 23.8 pg — ABNORMAL LOW (ref 26.0–34.0)
MCHC: 29.2 g/dL — ABNORMAL LOW (ref 30.0–36.0)
MCV: 81.4 fL (ref 80.0–100.0)
Monocytes Absolute: 1.9 10*3/uL — ABNORMAL HIGH (ref 0.1–1.0)
Monocytes Relative: 13 %
Neutro Abs: 10.7 10*3/uL — ABNORMAL HIGH (ref 1.7–7.7)
Neutrophils Relative %: 75 %
Platelets: 444 10*3/uL — ABNORMAL HIGH (ref 150–400)
RBC: 3.45 MIL/uL — ABNORMAL LOW (ref 3.87–5.11)
RDW: 17.3 % — ABNORMAL HIGH (ref 11.5–15.5)
WBC: 14.4 10*3/uL — ABNORMAL HIGH (ref 4.0–10.5)
nRBC: 0 % (ref 0.0–0.2)

## 2020-05-03 LAB — BASIC METABOLIC PANEL
Anion gap: 13 (ref 5–15)
BUN: 6 mg/dL — ABNORMAL LOW (ref 8–23)
CO2: 23 mmol/L (ref 22–32)
Calcium: 8.3 mg/dL — ABNORMAL LOW (ref 8.9–10.3)
Chloride: 97 mmol/L — ABNORMAL LOW (ref 98–111)
Creatinine, Ser: 0.54 mg/dL (ref 0.44–1.00)
GFR calc Af Amer: >60 mL/min (ref >60)
GFR calc non Af Amer: >60 mL/min (ref >60)
Glucose, Bld: 82 mg/dL (ref 70–99)
Potassium: 4.2 mmol/L (ref 3.5–5.1)
Sodium: 133 mmol/L — ABNORMAL LOW (ref 135–145)

## 2020-05-03 MED ORDER — PANTOPRAZOLE SODIUM 40 MG IV SOLR
40.0000 mg | INTRAVENOUS | Status: DC
  Administered 2020-05-03: 40 mg via INTRAVENOUS
  Filled 2020-05-03: qty 40

## 2020-05-03 MED ORDER — SODIUM CHLORIDE 0.9 % IV SOLN
INTRAVENOUS | Status: DC | PRN
  Administered 2020-05-03: 50 mL via INTRAVENOUS

## 2020-05-03 NOTE — Progress Notes (Addendum)
Progress Note    Kimberly [name]  PQZ:[id number] DOB: May 05, 1946  DOA: 04/27/2020 PCP: Birdie Sons, MD      Brief Narrative:    Medical records reviewed and are as summarized below:  Kimberly [name] is a 74 y.o. female       Assessment/Plan:   Principal Problem:   Atrial fibrillation with RVR ([place]) Active Problems:   SBO (small bowel obstruction) ([place])   Malignant neoplasm of colon ([place])   Chronic obstructive pulmonary disease ([facility])   Chronic respiratory failure with hypoxia ([facility])   Small bowel obstruction  Stage IV colon cancer, not a candidate for radiation therapy per radiation oncologist  Acute on chronic hypoxemic respiratory failure requiring up to 15 L/min oxygen via nonrebreathing mask  Pneumonia  Atrial fibrillation with RVR  COPD    PLAN  Repeat abdominal x-ray showed several air-filled small bowel loops on the left side of the abdomen consistent with small bowel obstruction although there is some slight improvement according to report. The patient requested that NG tube be removed. I had a long discussion with the patient and her daughter at the bedside.  Diagnoses and prognosis were discussed.  The patient has requested for comfort measures and wishes to reenroll with hospice. NG tube will be discontinued and patient will be started on clear liquid diet.  She understands that she may not be able to tolerate clear liquid diet and there is increased risk of vomiting and aspiration which can worsen respiratory failure.  She wants to have ice cream.  If she is able to tolerate clear liquid diet, diet may be advanced slowly. IV antibiotics, IV Protonix, IV Cardizem infusion and IV fluids have been discontinued. Patient has been transitioned to comfort care with plan to discharge home with hospice tomorrow.  TOC has been notified for hospice consult. Kimberly Maryland, RN and Kimberly Nieves, RN have been updated.     Body mass index is 25.75 kg/m.  Diet Order             Diet clear liquid Room service appropriate? Yes; Fluid consistency: Thin  Diet effective now                       Medications:   . Chlorhexidine Gluconate Cloth  6 each Topical Daily  . fluticasone  2 spray Each Nare Daily  . mometasone-formoterol  2 puff Inhalation BID  . sodium chloride flush  10-40 mL Intracatheter Q12H  . sodium chloride flush  3 mL Intravenous Q12H   Continuous Infusions: . sodium chloride 10 mL/hr at 05/03/20 1600     Anti-infectives (From admission, onward)   Start     Dose/Rate Route Frequency Ordered Stop   04/29/20 1400  cefTRIAXone (ROCEPHIN) 1 g in sodium chloride 0.9 % 100 mL IVPB  Status:  Discontinued        1 g 200 mL/hr over 30 Minutes Intravenous Every 24 hours 04/29/20 1332 05/03/20 1551   04/29/20 1400  azithromycin (ZITHROMAX) 500 mg in sodium chloride 0.9 % 250 mL IVPB        500 mg 250 mL/hr over 60 Minutes Intravenous Every 24 hours 04/29/20 1332 05/03/20 1539             Family Communication/Anticipated D/C date and plan/Code Status   DVT prophylaxis: SCDs Start: 04/28/20 0350 SCDs Start: 04/28/20 0350     Code Status: DNR  Family Communication: Plan discussed with Kimberly [name], daughter at the  bedside Disposition Plan:    Status is: Inpatient  Remains inpatient appropriate because:Inpatient level of care appropriate due to severity of illness   Dispo: The patient is from: Home              Anticipated d/c is to: Home with hospice              Anticipated d/c date is: 1 day              Patient currently is not medically stable to d/c.           Subjective:   C/o abdominal pain.  No vomiting.  She has not had any bowel movement since admission.  She said she had not moved her bowels for about 1 week prior to admission.  Objective:    Vitals:   05/03/20 0431 05/03/20 0810 05/03/20 1159 05/03/20 1500  BP: 118/61 (!) 121/57 (!) 126/58   Pulse: 88 79 75   Resp: 17 18 14    Temp: 98.2 F  (36.8 C) 98.2 F (36.8 C) 98 F (36.7 C)   TempSrc: Axillary Oral Oral   SpO2:  98% 98% 98%  Weight: 68 kg     Height:       No data found.   Intake/Output Summary (Last 24 hours) at 05/03/2020 1624 Last data filed at 05/03/2020 1600 Gross per 24 hour  Intake 4054.02 ml  Output 5500 ml  Net -1445.98 ml   Filed Weights   04/29/20 0449 05/02/20 0458 05/03/20 0431  Weight: 67.3 kg 68 kg 68 kg    Exam:  GEN: NAD, on oxygen via nonrebreathing mask SKIN: No rash EYES: EOMI ENT: MMM, NG tube connected to intermittent low wall suction CV: RRR PULM: CTA B ABD: soft, ND, NT, +BS CNS: AAO x 3, non focal EXT: No edema or tenderness   Data Reviewed:   I have personally reviewed following labs and imaging studies:  Labs: Labs show the following:   Basic Metabolic Panel: Recent Labs  Lab 04/29/20 0516 04/29/20 0516 04/30/20 0526 04/30/20 0526 05/01/20 0405 05/01/20 0405 05/02/20 0516 05/03/20 0646  NA 131*  --  135  --  134*  --  134* 133*  K 3.1*   < > 3.7   < > 3.4*   < > 3.9 4.2  CL 91*  --  97*  --  100  --  98 97*  CO2 25  --  27  --  27  --  27 23  GLUCOSE 101*  --  93  --  84  --  84 82  BUN 15  --  15  --  9  --  7* 6*  CREATININE 0.64  --  0.56  --  0.42*  --  0.50 0.54  CALCIUM 7.6*  --  7.9*  --  7.8*  --  8.2* 8.3*  MG  --   --  1.9  --  1.9  --  2.0  --    < > = values in this interval not displayed.   GFR Estimated Creatinine Clearance: 60.2 mL/min (by C-G formula based on SCr of 0.54 mg/dL). Liver Function Tests: Recent Labs  Lab 04/27/20 2335  AST 12*  ALT 8  ALKPHOS 66  BILITOT 0.8  PROT 6.5  ALBUMIN 3.0*   No results for input(s): LIPASE, AMYLASE in the last 168 hours. No results for input(s): AMMONIA in the last 168 hours. Coagulation profile No results for input(s): INR,  PROTIME in the last 168 hours.  CBC: Recent Labs  Lab 04/27/20 2335 04/28/20 0622 04/29/20 0516 04/30/20 0526 05/01/20 0405 05/02/20 0516 05/03/20 0646    WBC 14.4*   < > 17.8* 18.2* 15.1* 12.1* 14.4*  NEUTROABS 11.3*  --   --   --   --   --  10.7*  HGB 8.6*   < > 8.2* 8.1* 7.5* 7.9* 8.2*  HCT 27.3*   < > 27.2* 27.1* 25.5* 25.6* 28.1*  MCV 81.3   < > 82.2 82.1 82.3 81.0 81.4  PLT 571*   < > 474* 459* 444* 423* 444*   < > = values in this interval not displayed.   Cardiac Enzymes: No results for input(s): CKTOTAL, CKMB, CKMBINDEX, TROPONINI in the last 168 hours. BNP (last 3 results) No results for input(s): PROBNP in the last 8760 hours. CBG: No results for input(s): GLUCAP in the last 168 hours. D-Dimer: No results for input(s): DDIMER in the last 72 hours. Hgb A1c: No results for input(s): HGBA1C in the last 72 hours. Lipid Profile: No results for input(s): CHOL, HDL, LDLCALC, TRIG, CHOLHDL, LDLDIRECT in the last 72 hours. Thyroid function studies: No results for input(s): TSH, T4TOTAL, T3FREE, THYROIDAB in the last 72 hours.  Invalid input(s): FREET3 Anemia work up: No results for input(s): VITAMINB12, FOLATE, FERRITIN, TIBC, IRON, RETICCTPCT in the last 72 hours. Sepsis Labs: Recent Labs  Lab 04/29/20 0516 04/29/20 2039 04/30/20 0526 05/01/20 0405 05/02/20 0516 05/03/20 0646  WBC   < >  --  18.2* 15.1* 12.1* 14.4*  LATICACIDVEN  --  0.8  --   --   --   --    < > = values in this interval not displayed.    Microbiology Recent Results (from the past 240 hour(s))  SARS Coronavirus 2 by RT PCR (hospital order, performed in [facility] hospital lab) Nasopharyngeal Nasopharyngeal Swab     Status: None   Collection Time: 04/28/20  2:51 AM   Specimen: Nasopharyngeal Swab  Result Value Ref Range Status   SARS Coronavirus 2 NEGATIVE NEGATIVE Final    Comment: (NOTE) SARS-CoV-2 target nucleic acids are NOT DETECTED.  The SARS-CoV-2 RNA is generally detectable in upper and lower respiratory specimens during the acute phase of infection. The lowest concentration of SARS-CoV-2 viral copies this assay can detect is  250 copies / mL. A negative result does not preclude SARS-CoV-2 infection and should not be used as the sole basis for treatment or other patient management decisions.  A negative result may occur with improper specimen collection / handling, submission of specimen other than nasopharyngeal swab, presence of viral mutation(s) within the areas targeted by this assay, and inadequate number of viral copies (<250 copies / mL). A negative result must be combined with clinical observations, patient history, and epidemiological information.  Fact Sheet for Patients:   StrictlyIdeas.no  Fact Sheet for Healthcare Providers: BankingDealers.co.za  This test is not yet approved or  cleared by the [place] FDA and has been authorized for detection and/or diagnosis of SARS-CoV-2 by FDA under an Emergency Use Authorization (EUA).  This EUA will remain in effect (meaning this test can be used) for the duration of the COVID-19 declaration under Section 564(b)(1) of the Act, 21 U.S.C. section 360bbb-3(b)(1), unless the authorization is terminated or revoked sooner.  Performed at [facility], [place], [place] [address]   CULTURE, BLOOD (ROUTINE X 2) w Reflex to ID Panel  Status: None (Preliminary result)   Collection Time: 04/29/20  8:39 PM   Specimen: BLOOD  Result Value Ref Range Status   Specimen Description BLOOD BLOOD RIGHT HAND  Final   Special Requests   Final    BOTTLES DRAWN AEROBIC ONLY Blood Culture results may not be optimal due to an inadequate volume of blood received in culture bottles   Culture   Final    NO GROWTH 4 DAYS Performed at [facility], [address] [place], [place] [address]    Report Status PENDING  Incomplete  CULTURE, BLOOD (ROUTINE X 2) w Reflex to ID Panel     Status: None (Preliminary result)   Collection Time: 04/29/20  8:57 PM   Specimen: BLOOD  Result Value Ref  Range Status   Specimen Description BLOOD BLOOD LEFT HAND  Final   Special Requests   Final    BOTTLES DRAWN AEROBIC AND ANAEROBIC Blood Culture adequate volume   Culture   Final    NO GROWTH 4 DAYS Performed at [facility], [address], [place], [place] [address]    Report Status PENDING  Incomplete  Expectorated sputum assessment w rflx to resp cult     Status: None   Collection Time: 04/30/20  5:34 PM   Specimen: Sputum  Result Value Ref Range Status   Specimen Description SPUTUM  Final   Special Requests NONE  Final   Sputum evaluation   Final    Sputum specimen not acceptable for testing.  Please recollect.   RESULT CALLED TO, READ BACK BY AND VERIFIED WITH: MARCEL TURNER ON 04/30/2020 [id number] Performed at [facility], [address], [place], [place] [address]    Report Status 04/30/2020 FINAL  Final    Procedures and diagnostic studies:  DG Abd 1 View  Result Date: 05/03/2020 CLINICAL DATA:  Small bowel obstruction. EXAM: ABDOMEN - 1 VIEW COMPARISON:  May 02, 2020. FINDINGS: Nasogastric tube tip is seen in proximal stomach. Several air-filled small bowel loops are seen in the left side of the abdomen which may be slightly improved compared to prior exam. No colonic dilatation is noted. IMPRESSION: Several air-filled small bowel loops are seen in left side of the abdomen which may be slightly improved compared to prior exam. Electronically Signed   By: Marijo Conception M.D.   On: 05/03/2020 11:32   DG Abd 1 View  Result Date: 05/02/2020 CLINICAL DATA:  Bowel obstruction EXAM: ABDOMEN - 1 VIEW COMPARISON:  April 30, 2020 FINDINGS: Nasogastric tube tip and side port in proximal stomach. There remain loops of mildly dilated small bowel, similar to recent prior study. No air-fluid levels. No evident free air. There is atelectatic change in the visualized left lung base. Postoperative change noted in the proximal left femur. Occasional vascular calcifications  noted in the pelvis. IMPRESSION: Nasogastric tube tip and side port in proximal stomach. Mild small bowel dilatation in the left abdomen persists. No air-fluid level. No free air. Suspect that there is a residual degree of bowel obstruction. Electronically Signed   By: Lowella Grip III M.D.   On: 05/02/2020 08:00               LOS: 5 days   Zoeie Ritter  [facility]     05/03/2020, 4:24 PM

## 2020-05-03 NOTE — Progress Notes (Signed)
[place]  Telephone:([phone] [phone]   Name: CATHEY FREDENBURG Date: 05/03/2020 MRN: [id number]  DOB: 1946/09/03  Patient Care Team: Birdie Sons, MD as PCP - General (Family Medicine) Earlie Server, MD as Consulting Physician (Oncology) Noreene Filbert, MD as Referring Physician (Radiation Oncology) Jules Husbands, MD as Consulting Physician (General Surgery) Corey Skains, MD as Consulting Physician (Cardiology)    REASON FOR CONSULTATION: [name] is a 74 y.o. female with multiple medical problems including including metastatic colon cancer status post resection in 2018 with colostomy/reversal in 05/2018 and status post adjuvant FOLFOX. PET scan on 09/19/2019 revealed recurrent disease. Patient underwent CT-guided biopsy 08/20/2019 one of her abdominal wall mass revealing metastatic adenocarcinoma of the colon. Patient was hospitalized 12/08/2019-12/10/2019 with A. fib/RVR and was started on anticoagulation with Eliquis. On 12/24/2019 patient had anaphylactic reaction to cetuximab requiring intubation. Patient decided to forego further treatment. She was referred to hospice.  Patient developed hematuria in July 2021.  CT of abdomen/pelvis on 04/19/2020 revealed disease progression with new necrotic mass invading the bladder, increased nodularity in the right anterior abdominal wall, and peripheral enhancing lesion in the anterior uterine fundus suspicious for metastatic disease.  Patient's Eliquis was discontinued.  She was seen by urology and radiation oncology.  Patient ultimately revoked hospice to pursue XRT.  She subsequently was admitted to the hospital on 04/28/2020 with nausea and vomiting and found to have a partial SBO.  Palliative care was consulted help address goals..    CODE STATUS: DNR  PAST MEDICAL HISTORY: Past Medical History:  Diagnosis Date  . Breast cancer, left ([place]) 2004   Lumpectomy and rad tx's.   . Chronic back pain   . Colon cancer ([place]) 2018  . COPD (chronic obstructive pulmonary disease) ([place])   . Degenerative disc disease, lumbar 04/2013  . Family history of adverse reaction to anesthesia    son arrested after anesthesia  . GERD (gastroesophageal reflux disease)   . Hypertension   . Iron deficiency anemia 05/27/2017  . Personal history of chemotherapy 2018   Colon  . Personal history of radiation therapy    Breast 2004  . Personal history of radiation therapy 2018   Colon  . Postprocedural intraabdominal abscess 09/01/2018  . Small bowel obstruction ([place]) 04/16/2017  . Wears dentures    full upper    PAST SURGICAL HISTORY:  Past Surgical History:  Procedure Laterality Date  . APPENDECTOMY  1965  . BREAST EXCISIONAL BIOPSY Left 08/01/2003   lumpectomy rad 11/04-2/28/2005  . BREAST SURGERY    . CESAREAN SECTION     x3  . COLON SURGERY    . COLONOSCOPY W/ POLYPECTOMY    . COLONOSCOPY WITH PROPOFOL N/A 04/09/2018   Procedure: COLONOSCOPY WITH PROPOFOL;  Surgeon: Virgel Manifold, MD;  Location: [facility];  Service: Gastroenterology;  Laterality: N/A;  . COLONOSCOPY WITH PROPOFOL N/A 08/13/2019   Procedure: COLONOSCOPY WITH PROPOFOL;  Surgeon: Virgel Manifold, MD;  Location: [facility];  Service: Endoscopy;  Laterality: N/A;  . COLOSTOMY TAKEDOWN N/A 06/25/2018   Procedure: COLOSTOMY TAKEDOWN;  Surgeon: Jules Husbands, MD;  Location: [facility];  Service: General;  Laterality: N/A;  . ESOPHAGOGASTRODUODENOSCOPY (EGD) WITH PROPOFOL N/A 04/04/2017   Procedure: ESOPHAGOGASTRODUODENOSCOPY (EGD) WITH PROPOFOL;  Surgeon: [name], MD;  Location: [facility];  Service: Endoscopy;  Laterality: N/A;  . FRACTURE SURGERY    . HIP FRACTURE SURGERY Left 01/25/2012  .  LAPAROSCOPY N/A 12/07/2019   Procedure: LAPAROSCOPY DIAGNOSTIC;  Surgeon: Jules Husbands, MD;  Location: [facility];  Service: General;  Laterality: N/A;  . LAPAROTOMY N/A 04/15/2017    Procedure: EXPLORATORY LAPAROTOMY;  Surgeon: Clayburn Pert, MD;  Location: [facility];  Service: General;  Laterality: N/A;  . NECK SURGERY  12/2011  . PARASTOMAL HERNIA REPAIR N/A 12/03/2017   Procedure: HERNIA REPAIR PARASTOMAL;  Surgeon: Jules Husbands, MD;  Location: [facility];  Service: General;  Laterality: N/A;  . PARASTOMAL HERNIA REPAIR N/A 06/25/2018   Procedure: HERNIA REPAIR PARASTOMAL;  Surgeon: Jules Husbands, MD;  Location: [facility];  Service: General;  Laterality: N/A;  . PORTACATH PLACEMENT Right 05/21/2017   Procedure: INSERTION PORT-A-CATH;  Surgeon: Clayburn Pert, MD;  Location: [facility];  Service: General;  Laterality: Right;  . PORTACATH PLACEMENT Right 12/07/2019   Procedure: INSERTION PORT-A-CATH;  Surgeon: Jules Husbands, MD;  Location: [facility];  Service: General;  Laterality: Right;  . WRIST FRACTURE SURGERY      HEMATOLOGY/ONCOLOGY HISTORY:  Oncology History  Malignant neoplasm of colon ([place])  04/26/2017 Initial Diagnosis   Malignant neoplasm of transverse colon ([place])   11/06/2019 - 11/06/2019 Chemotherapy   The patient had palonosetron (ALOXI) injection 0.25 mg, 0.25 mg, Intravenous,  Once, 0 of 4 cycles irinotecan (CAMPTOSAR) 320 mg in sodium chloride 0.9 % 500 mL chemo infusion, 180 mg/m2, Intravenous,  Once, 0 of 4 cycles fluorouracil (ADRUCIL) chemo injection 750 mg, 400 mg/m2, Intravenous,  Once, 0 of 4 cycles fluorouracil (ADRUCIL) 4,350 mg in sodium chloride 0.9 % 63 mL chemo infusion, 2,400 mg/m2, Intravenous, 1 Day/Dose, 0 of 4 cycles bevacizumab-bvzr (ZIRABEV) 400 mg in sodium chloride 0.9 % 100 mL chemo infusion, 5 mg/kg, Intravenous,  Once, 0 of 4 cycles leucovorin 728 mg in sodium chloride 0.9 % 250 mL infusion, 400 mg/m2, Intravenous,  Once, 0 of 4 cycles  for chemotherapy treatment.    12/24/2019 - 12/24/2019 Chemotherapy   The patient had cetuximab (ERBITUX) chemo infusion 700 mg, 400 mg/m2 = 700 mg, Intravenous,  Once, 1 of 4 cycles Administration:  700 mg (12/24/2019)  for chemotherapy treatment.    01/07/2020 -  Chemotherapy   The patient had panitumumab (VECTIBIX) 400 mg in sodium chloride 0.9 % 100 mL chemo infusion, 6 mg/kg = 400 mg, Intravenous,  Once, 1 of 1 cycle Administration: 400 mg (01/07/2020) panitumumab (VECTIBIX) 400 mg in sodium chloride 0.9 % 100 mL chemo infusion, 6 mg/kg, Intravenous,  Once, 0 of 3 cycles  for chemotherapy treatment.      ALLERGIES:  is allergic to erbitux [cetuximab], betadine [povidone iodine], iodine, other, and sinus formula [cholestatin].  MEDICATIONS:  Current Facility-Administered Medications  Medication Dose Route Frequency Provider Last Rate Last Admin  . 0.9 % NaCl with KCl 20 mEq/ L  infusion   Intravenous Continuous Nicole Kindred A, DO 75 mL/hr at 05/03/20 1201 Rate Verify at 05/03/20 1201  . albuterol (PROVENTIL) (2.5 MG/3ML) 0.083% nebulizer solution 2.5 mg  2.5 mg Inhalation Q4H PRN Opyd, Ilene Qua, MD      . azithromycin (ZITHROMAX) 500 mg in sodium chloride 0.9 % 250 mL IVPB  500 mg Intravenous Q24H Oswald Hillock, RPH   Paused at 05/02/20 1713  . calcium carbonate (TUMS - dosed in mg elemental calcium) chewable tablet 200 mg of elemental calcium  1 tablet Oral TID PRN Sharion Settler, NP      . cefTRIAXone (ROCEPHIN) 1 g in sodium chloride 0.9 % 100  mL IVPB  1 g Intravenous Q24H Oswald Hillock, [place]   Paused at 05/02/20 1524  . Chlorhexidine Gluconate Cloth 2 % PADS 6 each  6 each Topical Daily Max Sane, MD   6 each at 05/03/20 1100  . diltiazem (CARDIZEM) 125 mg in dextrose 5% 125 mL (1 mg/mL) infusion  5-15 mg/hr Intravenous Continuous Opyd, Ilene Qua, MD 15 mL/hr at 05/03/20 1229 15 mg/hr at 05/03/20 1229  . fluticasone (FLONASE) 50 MCG/ACT nasal spray 2 spray  2 spray Each Nare Daily Sharion Settler, NP   2 spray at 05/02/20 1000  . LORazepam (ATIVAN) injection 0.5 mg  0.5 mg Intravenous Q4H PRN Sharion Settler, NP   0.5 mg at 04/30/20 1239  . mometasone-formoterol (DULERA)  100-5 MCG/ACT inhaler 2 puff  2 puff Inhalation BID Opyd, Ilene Qua, MD   2 puff at 05/03/20 0859  . morphine 2 MG/ML injection 2 mg  2 mg Intravenous Q2H PRN Sharion Settler, NP   2 mg at 05/03/20 1328  . ondansetron (ZOFRAN) tablet 4 mg  4 mg Oral Q6H PRN Opyd, Ilene Qua, MD       Or  . ondansetron (ZOFRAN) injection 4 mg  4 mg Intravenous Q6H PRN Opyd, Ilene Qua, MD   4 mg at 05/02/20 0413  . pantoprazole (PROTONIX) injection 40 mg  40 mg Intravenous Q24H Sharion Settler, NP   40 mg at 05/03/20 0331  . phenol (CHLORASEPTIC) mouth spray 1 spray  1 spray Mouth/Throat PRN Nicole Kindred A, DO      . sodium chloride flush (NS) 0.9 % injection 10-40 mL  10-40 mL Intracatheter Q12H Nicole Kindred A, DO   10 mL at 05/03/20 0841  . sodium chloride flush (NS) 0.9 % injection 10-40 mL  10-40 mL Intracatheter PRN Nicole Kindred A, DO      . sodium chloride flush (NS) 0.9 % injection 3 mL  3 mL Intravenous Q12H Opyd, Ilene Qua, MD   3 mL at 05/03/20 0841    VITAL SIGNS: BP (!) 126/58 (BP Location: Left Arm)   Pulse 75   Temp 98 F (36.7 C) (Oral)   Resp 14   Ht _0  (1.626 m)   Wt 150 lb (68 kg)   SpO2 98%   BMI 25.75 kg/m  Filed Weights   04/29/20 0449 05/02/20 0458 05/03/20 0431  Weight: 148 lb 4.8 oz (67.3 kg) 150 lb (68 kg) 150 lb (68 kg)    Estimated body mass index is 25.75 kg/m as calculated from the following:   Height as of this encounter: _1  (1.626 m).   Weight as of this encounter: 150 lb (68 kg).  LABS: CBC:    Component Value Date/Time   WBC 14.4 (H) 05/03/2020 0646   HGB 8.2 (L) 05/03/2020 0646   HGB 12.2 01/02/2017 1410   HCT 28.1 (L) 05/03/2020 0646   HCT 37.3 01/02/2017 1410   PLT 444 (H) 05/03/2020 0646   PLT 486 (H) 01/02/2017 1410   MCV 81.4 05/03/2020 0646   MCV 86 01/02/2017 1410   MCV 96 01/29/2012 0428   NEUTROABS 10.7 (H) 05/03/2020 0646   NEUTROABS 7.8 (H) 01/02/2017 1410   NEUTROABS 9.2 (H) 01/29/2012 0428   LYMPHSABS 1.3 05/03/2020 0646    LYMPHSABS 3.9 (H) 01/02/2017 1410   LYMPHSABS 1.9 01/29/2012 0428   MONOABS 1.9 (H) 05/03/2020 0646   MONOABS 2.0 (H) 01/29/2012 0428   EOSABS 0.4 05/03/2020 0646   EOSABS 0.4 01/02/2017 1410  EOSABS 0.1 01/29/2012 0428   BASOSABS 0.1 05/03/2020 0646   BASOSABS 0.1 01/02/2017 1410   BASOSABS 0.1 01/29/2012 0428   Comprehensive Metabolic Panel:    Component Value Date/Time   NA 133 (L) 05/03/2020 0646   NA 141 01/02/2017 1410   NA 136 01/27/2012 0637   K 4.2 05/03/2020 0646   K 4.3 01/27/2012 0637   CL 97 (L) 05/03/2020 0646   CL 102 01/27/2012 0637   CO2 23 05/03/2020 0646   CO2 25 01/27/2012 0637   BUN 6 (L) 05/03/2020 0646   BUN 14 01/02/2017 1410   BUN 15 01/27/2012 0637   CREATININE 0.54 05/03/2020 0646   CREATININE 0.67 01/27/2012 0637   GLUCOSE 82 05/03/2020 0646   GLUCOSE 119 (H) 01/27/2012 0637   CALCIUM 8.3 (L) 05/03/2020 0646   CALCIUM 7.7 (L) 01/27/2012 0637   AST 12 (L) 04/27/2020 2335   AST 17 01/26/2012 0021   ALT 8 04/27/2020 2335   ALT 19 01/26/2012 0021   ALKPHOS 66 04/27/2020 2335   ALKPHOS 87 01/26/2012 0021   BILITOT 0.8 04/27/2020 2335   BILITOT <0.2 01/02/2017 1410   BILITOT 0.2 01/26/2012 0021   PROT 6.5 04/27/2020 2335   PROT 7.0 01/02/2017 1410   PROT 7.6 01/26/2012 0021   ALBUMIN 3.0 (L) 04/27/2020 2335   ALBUMIN 3.9 01/02/2017 1410   ALBUMIN 3.5 01/26/2012 0021    RADIOGRAPHIC STUDIES: CT Abdomen Pelvis W Wo Contrast  Result Date: 04/19/2020 CLINICAL DATA:  Painless hematuria for 5 days. History of colon cancer with colostomy and reversal. EXAM: CT ABDOMEN AND PELVIS WITHOUT AND WITH CONTRAST TECHNIQUE: Multidetector CT imaging of the abdomen and pelvis was performed following the standard protocol before and following the bolus administration of intravenous contrast. CONTRAST:  141m OMNIPAQUE IOHEXOL 300 MG/ML  SOLN COMPARISON:  Abdominopelvic CT 12/08/2019. FINDINGS: Lower chest: Stable linear scarring in the right lower lobe and mild  chronic central airway thickening at both lung bases. Hepatobiliary: The liver is normal in density without suspicious focal abnormality. No evidence of gallstones, gallbladder wall thickening or biliary dilatation. Pancreas: Unremarkable. No pancreatic ductal dilatation or surrounding inflammatory changes. Spleen: Normal in size without focal abnormality. Adrenals/Urinary Tract: There are stable low-density adrenal masses consistent with adenomas. These measure up to 2.7 cm on the right and 5.3 cm on the left. Pre-contrast images demonstrate no renal, ureteral or bladder calculi. Post-contrast, both kidneys enhance normally. There is no evidence of enhancing renal mass. Delayed images result in segmental visualization of the ureters. No focal upper tract urothelial abnormalities are identified. New abnormal tenting of the bladder superiorly on the right with an adjacent ill-defined low-density mass with peripheral enhancement measuring up to 4.8 x 3.5 cm on image 61/5. This is inferior to the ileocolonic anastomosis and is worrisome for local recurrence of colon cancer with central necrosis. This mass may invade the bladder. There is mild bladder wall thickening without other focal bladder abnormality. Stomach/Bowel: Stable mild diffuse gastric wall thickening. No small bowel distension or surrounding inflammation. Status post right hemicolectomy with ileocolonic anastomosis. There is an additional anastomosis in the distal transverse colon. No evidence of bowel obstruction. Vascular/Lymphatic: As above, new centrally necrotic mass in the right lower quadrant worrisome for local recurrence of colon cancer or metastatic adenopathy. There are no enlarged retroperitoneal lymph nodes. Diffuse aortic and branch vessel atherosclerosis. The portal, superior mesenteric and splenic veins are patent. Reproductive: Peripherally enhancing lesion along the anterior uterine fundus measures up to 2.9  x 2.1 cm on image 59/11. This  could reflect a uterine fibroid, although is not seen on previous studies, and could reflect a metastasis given the other findings. No adnexal mass. Other: In addition to the right lower quadrant centrally necrotic lesion described above, there is a small spiculated lesion posterior to the ileocolonic anastomosis measuring 12 mm on image 50/5. There is increased nodularity in the right anterior abdominal wall measuring 3.2 x 2.3 cm on image 39/5. No ascites or generalized peritoneal nodularity. Musculoskeletal: No acute or significant osseous findings. Previous left hip ORIF. IMPRESSION: 1. New centrally necrotic mass in the right lower quadrant with abnormal tenting of the bladder and possible bladder invasion, worrisome for local recurrence of colon cancer or metastatic adenopathy. At least one other mildly enlarged lymph node is noted. 2. Increased nodularity in the right anterior abdominal wall, suspicious for metastatic disease. 3. Peripherally enhancing lesion in the anterior uterine fundus suspicious for metastatic disease given nonvisualization on previous imaging. 4. No evidence of urinary tract calculus, renal mass or hydronephrosis. 5. Stable bilateral adrenal adenomas. 6. Aortic Atherosclerosis (ICD10-I70.0). 7. These results will be called to the ordering clinician or representative by the Radiologist Assistant, and communication documented in the PACS or [employer]. Electronically Signed   By: Richardean Sale M.D.   On: 04/19/2020 16:16   CT ABDOMEN PELVIS WO CONTRAST  Result Date: 04/28/2020 CLINICAL DATA:  Nonlocalized abdominal pain with nausea EXAM: CT ABDOMEN AND PELVIS WITHOUT CONTRAST TECHNIQUE: Multidetector CT imaging of the abdomen and pelvis was performed following the standard protocol without IV contrast. COMPARISON:  None. FINDINGS: Lower chest: The visualized heart size within normal limits. No pericardial fluid/thickening. No hiatal hernia. Patchy airspace opacities seen at  the posterior left lung base. Hepatobiliary: Although limited due to the lack of intravenous contrast, normal in appearance without gross focal abnormality. No evidence of calcified gallstones or biliary ductal dilatation. Pancreas:  Unremarkable.  No surrounding inflammatory changes. Spleen: Normal in size. Although limited due to the lack of intravenous contrast, normal in appearance. Adrenals/Urinary Tract: Both adrenal glands appear normal. The kidneys and collecting system appear normal without evidence of urinary tract calculus or hydronephrosis. Bladder is unremarkable. Stomach/Bowel: There is a mildly dilated fluid and food stuff filled stomach. Dilated air and fluid-filled loops small bowel are seen throughout the mid abdomen measuring up to 4.5 cm down to the level of the distal ileum within the right lower quadrant where there is a gradual area of narrowing which is not well seen. The distal ileal loops and terminal ileum are decompressed. There is been a prior partial right colectomy with surgical sutures noted. A moderate amount of colonic stool is seen throughout. Scattered colonic diverticula are seen. Vascular/Lymphatic: There are no enlarged abdominal or pelvic lymph nodes. Scattered aortic atherosclerotic calcifications are seen without aneurysmal dilatation. Reproductive: The deep pelvis is unremarkable. Other: No evidence of abdominal wall mass or hernia. Musculoskeletal: No acute or significant osseous findings. IMPRESSION: Findings consistent with a partial small bowel obstruction down to the level of the right lower abdomen where there is a gradual area of narrowing within the distal ileal loops. No definite focal transition point however is noted. Aortic Atherosclerosis (ICD10-I70.0). Electronically Signed   By: Prudencio Pair M.D.   On: 04/28/2020 01:08   DG Chest 1 View  Result Date: 04/29/2020 CLINICAL DATA:  74 year old currently being treated for metastatic colon cancer, presenting  with hypoxia. Former smoker with COPD. EXAM: PORTABLE CHEST 1 VIEW COMPARISON:  01/10/2020. FINDINGS: RIGHT jugular Port-A-Cath tip projects over the LOWER SVC, unchanged. Nasogastric tube tip projects over the fundus of the stomach. Cardiac silhouette normal in size, unchanged. Airspace consolidation involving the LEFT LOWER LOBE, silhouetting the LEFT hemidiaphragm. Lungs otherwise clear. Emphysematous changes throughout both lungs, unchanged. Probable small to moderate-sized LEFT pleural effusion. No visible RIGHT pleural effusion. IMPRESSION: 1. Acute LEFT LOWER LOBE pneumonia. 2. Probable small to moderate-sized LEFT pleural effusion. Electronically Signed   By: Evangeline Dakin M.D.   On: 04/29/2020 09:03   DG Abd 1 View  Result Date: 05/03/2020 CLINICAL DATA:  Small bowel obstruction. EXAM: ABDOMEN - 1 VIEW COMPARISON:  May 02, 2020. FINDINGS: Nasogastric tube tip is seen in proximal stomach. Several air-filled small bowel loops are seen in the left side of the abdomen which may be slightly improved compared to prior exam. No colonic dilatation is noted. IMPRESSION: Several air-filled small bowel loops are seen in left side of the abdomen which may be slightly improved compared to prior exam. Electronically Signed   By: Marijo Conception M.D.   On: 05/03/2020 11:32   DG Abd 1 View  Result Date: 05/02/2020 CLINICAL DATA:  Bowel obstruction EXAM: ABDOMEN - 1 VIEW COMPARISON:  April 30, 2020 FINDINGS: Nasogastric tube tip and side port in proximal stomach. There remain loops of mildly dilated small bowel, similar to recent prior study. No air-fluid levels. No evident free air. There is atelectatic change in the visualized left lung base. Postoperative change noted in the proximal left femur. Occasional vascular calcifications noted in the pelvis. IMPRESSION: Nasogastric tube tip and side port in proximal stomach. Mild small bowel dilatation in the left abdomen persists. No air-fluid level. No free air.  Suspect that there is a residual degree of bowel obstruction. Electronically Signed   By: Lowella Grip III M.D.   On: 05/02/2020 08:00   DG Abd 1 View  Result Date: 04/30/2020 CLINICAL DATA:  Small bowel obstruction. EXAM: ABDOMEN - 1 VIEW COMPARISON:  Abdominal CT 04/28/2020 FINDINGS: Gaseous small bowel distention in the left abdomen with greatest small bowel diameter of 3.7 cm. The degree of small-bowel distention appears slightly improved from prior. Decreased stool burden. Chain sutures in the left upper quadrant. No evidence of free air. Stable osseous structures. IMPRESSION: Gaseous small bowel distension with slight improvement from CT 2 days ago. Findings likely represent improving small bowel obstruction. Electronically Signed   By: Keith Rake M.D.   On: 04/30/2020 17:08   DG Abdomen Acute W/Chest  Result Date: 04/27/2020 CLINICAL DATA:  74 year old female with abdominal pain, nausea, atrial fibrillation with RVR. EXAM: DG ABDOMEN ACUTE W/ 1V CHEST COMPARISON:  CT Abdomen and Pelvis 04/19/2020 and earlier. FINDINGS: Portable AP upright view at 2336 hours. Increased elevation of the left hemidiaphragm from the CT last week. Normal cardiac size and mediastinal contours. Visualized tracheal air column is within normal limits. Increased pulmonary interstitial markings appear stable. Right chest Port-A-Cath which is accessed. No pneumothorax. No definite pleural effusion. Upright and supine views of the abdomen and pelvis. Increased gastric and small bowel air since the prior CT, including dilated 4 cm left lower quadrant small bowel loops. Previous partial colectomy, but little to no gas is identified in the residual large bowel. Superimposed mesenteric masses demonstrated by CT. No pneumoperitoneum identified. Stable visualized osseous structures. IMPRESSION: 1. Appearance suspicious for new small-bowel obstruction since the CT 04/19/2020, perhaps secondary to the mesenteric mass seen at  that time. 2. No pneumoperitoneum identified.  3. New elevation of the left hemidiaphragm probably related to #1. No definite pleural fluid. Electronically Signed   By: Genevie Ann M.D.   On: 04/27/2020 23:56    PERFORMANCE STATUS (ECOG) : 3 - Symptomatic, >50% confined to bed  Review of Systems Unless otherwise noted, a complete review of systems is negative.  Physical Exam General: NAD, frail appearing Pulmonary: Unlabored Extremities: no edema, no joint deformities Skin: no rashes Neurological: Weakness but otherwise nonfocal  IMPRESSION: Overall no significant clinical improvement.  NGT is still in place but it appears that she has minimal output.  She remains on a nonrebreather at 10 to 15 L but SaO2 currently is in the mid to high 90s.  Case discussed with Dr. Mal Misty. Will obtain repeat KUB. Consider trial removal of NGT and continue to wean O2 as allowed.   I met with patient's daughter.  We discussed the option of hospice involvement either at home or hospice facility.  She asked about home health but I suspect the patient would be better served under hospice care given the expectation of cancer progression and overall decline.  It is yet to be seen if patient will tolerate oral intake and if not, decline would be anticipated.  PLAN: -Continue best supportive care -RT to wean oxygen as able -Repeat KUB -Recommend hospice either at home or hospice facility    Time Total: 35 minutes  Visit consisted of counseling and education dealing with the complex and emotionally intense issues of symptom management and palliative care in the setting of serious and potentially life-threatening illness.Greater than 50%  of this time was spent counseling and coordinating care related to the above assessment and plan.  Signed by: Altha Harm, PhD, NP-C

## 2020-05-04 LAB — CULTURE, BLOOD (ROUTINE X 2)
Culture: NO GROWTH
Culture: NO GROWTH
Special Requests: ADEQUATE

## 2020-05-04 MED ORDER — HEPARIN SOD (PORK) LOCK FLUSH 100 UNIT/ML IV SOLN
500.0000 [IU] | Freq: Once | INTRAVENOUS | Status: AC
  Administered 2020-05-04: 500 [IU] via INTRAVENOUS
  Filled 2020-05-04: qty 5

## 2020-05-04 MED ORDER — DILTIAZEM HCL ER COATED BEADS 240 MG PO CP24: 240.0000 mg | ORAL_CAPSULE | Freq: Every day | ORAL | 0 refills | Status: AC

## 2020-05-04 MED ORDER — DILTIAZEM HCL ER COATED BEADS 120 MG PO CP24
240.0000 mg | ORAL_CAPSULE | Freq: Every day | ORAL | Status: DC
  Administered 2020-05-04: 240 mg via ORAL
  Filled 2020-05-04: qty 2

## 2020-05-04 NOTE — Progress Notes (Signed)
Patient discharged to home with hospice and family care.  Foley bag changed prior to transfer.  Report given to EMS.

## 2020-05-04 NOTE — TOC Transition Note (Signed)
Transition of Care [facility]) - CM/SW Discharge Note   Patient Details  Name: Kimberly [name] MRN: [id number] Date of Birth: [birth year]  Transition of Care [facility]) CM/SW Contact:  Shelbie Ammons, RN Phone Number: 05/04/2020, 10:38 AM   Clinical Narrative:  RNCM met with patient and daughter at bedside. Patient is going home with Hospice services and Kieth Brightly with [facility] has already met with them this morning. Patient reports that she has all the equipment that she wants in the home and they are just waiting on a new concentrator to be delivered. RNCM completed EMS paperwork and bedside nurse reported she will call for transport when she knows oxygen is in home.       Final next level of care: Home w Hospice Care Barriers to Discharge: No Barriers Identified   Patient Goals and CMS Choice        Discharge Placement                  Name of family member notified: daughter in room    Discharge Plan and Services                                  Representative spoke with at [place]: Santiago Glad with [facility]  Social Determinants of Health ([place]) Interventions     Readmission Risk Interventions Readmission Risk Prevention Plan 05/02/2020  Transportation Screening Complete  Medication Review [employer]) Complete  PCP or Specialist appointment within 3-5 days of discharge Complete  HRI or Home Care Consult Complete  SW Recovery Care/Counseling Consult Complete  Palliative Care Screening Complete  Some recent data might be hidden

## 2020-05-04 NOTE — Progress Notes (Addendum)
[facility]) Hospital Liaison RN note:  Received request from Billey Chang, NP for hospice services at home after discharge. Patient was previous hospice patient until 10 days ago when she revoked to pursue treatment. RN has spoken with [facility] physician and patient is eligible for hospice. Care team is aware.  Spoke with patient and daughter, Neoma Laming in the room regarding hospice philosophy and comfort care measures and medications. They verbalized understanding. Plan is for discharge today by EMS once an O2 concentrator that is able to meet patient's needs are delivered to home.  Please send DNR home with patient and provide prescriptions for ongoing symptom management.  [facility] information and contact number was given to patient and daughter.  Please call with any hospice related questions or concerns.  Thank you for the opportunity to participate in this patient's care.  Zandra Abts, RN [facility] Liaison  [phone]

## 2020-05-04 NOTE — Discharge Summary (Addendum)
Physician Discharge Summary  Kimberly [name] GOT:[id number] DOB: 1946-06-19 DOA: 04/27/2020  PCP: Birdie Sons, MD  Admit date: 04/27/2020 Discharge date: 05/04/2020  Discharge disposition: Home with hospice   Recommendations for Outpatient Follow-Up:    Follow-up with hospice at home   Discharge Diagnosis:   Principal Problem:   Atrial fibrillation with RVR ([place]) Active Problems:   SBO (small bowel obstruction) ([place])   Malignant neoplasm of colon ([place])   Chronic obstructive pulmonary disease ([place])   Chronic respiratory failure with hypoxia ([place])    Discharge Condition: Stable.  Diet recommendation:  Diet Order            Diet general                       Code Status: DNR     Hospital Course:   [name] a 74 y.o.femalewith medical history significant formetastatic colon cancer who was on hospice but revoked on 7/29 to pursue palliative radiation therapy, COPD with chronic hypoxic respiratory failure, hypertension, and atrial fibrillation, presented to the ED on 04/28/20 with abdominal pain, constipation, nausea, and vomiting which had progressed over a week.   She was found to have severe sepsis secondary to left lower lobe pneumonia, acute on chronic hypoxemic respiratory failure and small bowel obstruction from metastatic colon cancer with carcinomatosis.  She met criteria for severe sepsis on admission because of tachycardia, leukocytosis, tachypnea and evidence of organ dysfunction in the form of acute hypoxemic respiratory failure.  She was treated with empiric IV antibiotics and oxygen via nasal cannula and ultimately required 15 L/min oxygen via nonrebreathing mask.  SBO was managed conservatively with bowel rest, IV fluids and gastric decompression with NG tube.  General surgery was consulted but unfortunately she was deemed not to be a good surgical candidate and surgery was not an option for her.  General surgeon signed off.  She was also  seen by the cardiologist because of atrial fibrillation with RVR.  She was treated with IV Cardizem infusion.   Because of lack of improvement in her condition, she opted for comfort measures with hospice at home.  She was seen by the palliative care and hospice team and she was deemed to be a good candidate for hospice at home.  Patient and her daughter have agreed that patient be discharged home today with hospice.  She prefers to continue taking Cardizem and metoprolol for atrial fibrillation.  All discharge medications were discussed and patient decided to discontinue medications listed below.  Discharge plan discussed with patient, Colletta Maryland (patient's nurse), Kieth Brightly (hospice nurse) and her daughter, Hilda Blades at the bedside.   Medical Consultants:    Cardiologist, Dr. Serafina Royals  General surgeon, Dr. Fredirick Maudlin  Palliative care and hospice team   Discharge Exam:    Vitals:   05/04/20 0521 05/04/20 0655 05/04/20 0715 05/04/20 0801  BP:    121/74  Pulse: 63   (!) 142  Resp: 17   16  Temp:      TempSrc:    Oral  SpO2: 95% (!) 81% 93% 96%  Weight:      Height:         GEN: NAD SKIN: No rash EYES: EOMI ENT: MMM CV: RRR PULM: CTA B ABD: soft, ND, NT, +BS CNS: AAO x 3, non focal EXT: No edema or tenderness   The results of significant diagnostics from this hospitalization (including imaging, microbiology, ancillary and laboratory) are listed  below for reference.     Procedures and Diagnostic Studies:   CT ABDOMEN PELVIS WO CONTRAST  Result Date: 04/28/2020 CLINICAL DATA:  Nonlocalized abdominal pain with nausea EXAM: CT ABDOMEN AND PELVIS WITHOUT CONTRAST TECHNIQUE: Multidetector CT imaging of the abdomen and pelvis was performed following the standard protocol without IV contrast. COMPARISON:  None. FINDINGS: Lower chest: The visualized heart size within normal limits. No pericardial fluid/thickening. No hiatal hernia. Patchy airspace opacities seen at the  posterior left lung base. Hepatobiliary: Although limited due to the lack of intravenous contrast, normal in appearance without gross focal abnormality. No evidence of calcified gallstones or biliary ductal dilatation. Pancreas:  Unremarkable.  No surrounding inflammatory changes. Spleen: Normal in size. Although limited due to the lack of intravenous contrast, normal in appearance. Adrenals/Urinary Tract: Both adrenal glands appear normal. The kidneys and collecting system appear normal without evidence of urinary tract calculus or hydronephrosis. Bladder is unremarkable. Stomach/Bowel: There is a mildly dilated fluid and food stuff filled stomach. Dilated air and fluid-filled loops small bowel are seen throughout the mid abdomen measuring up to 4.5 cm down to the level of the distal ileum within the right lower quadrant where there is a gradual area of narrowing which is not well seen. The distal ileal loops and terminal ileum are decompressed. There is been a prior partial right colectomy with surgical sutures noted. A moderate amount of colonic stool is seen throughout. Scattered colonic diverticula are seen. Vascular/Lymphatic: There are no enlarged abdominal or pelvic lymph nodes. Scattered aortic atherosclerotic calcifications are seen without aneurysmal dilatation. Reproductive: The deep pelvis is unremarkable. Other: No evidence of abdominal wall mass or hernia. Musculoskeletal: No acute or significant osseous findings. IMPRESSION: Findings consistent with a partial small bowel obstruction down to the level of the right lower abdomen where there is a gradual area of narrowing within the distal ileal loops. No definite focal transition point however is noted. Aortic Atherosclerosis (ICD10-I70.0). Electronically Signed   By: Prudencio Pair M.D.   On: 04/28/2020 01:08   DG Abdomen Acute W/Chest  Result Date: 04/27/2020 CLINICAL DATA:  74 year old female with abdominal pain, nausea, atrial fibrillation with  RVR. EXAM: DG ABDOMEN ACUTE W/ 1V CHEST COMPARISON:  CT Abdomen and Pelvis 04/19/2020 and earlier. FINDINGS: Portable AP upright view at 2336 hours. Increased elevation of the left hemidiaphragm from the CT last week. Normal cardiac size and mediastinal contours. Visualized tracheal air column is within normal limits. Increased pulmonary interstitial markings appear stable. Right chest Port-A-Cath which is accessed. No pneumothorax. No definite pleural effusion. Upright and supine views of the abdomen and pelvis. Increased gastric and small bowel air since the prior CT, including dilated 4 cm left lower quadrant small bowel loops. Previous partial colectomy, but little to no gas is identified in the residual large bowel. Superimposed mesenteric masses demonstrated by CT. No pneumoperitoneum identified. Stable visualized osseous structures. IMPRESSION: 1. Appearance suspicious for new small-bowel obstruction since the CT 04/19/2020, perhaps secondary to the mesenteric mass seen at that time. 2. No pneumoperitoneum identified. 3. New elevation of the left hemidiaphragm probably related to #1. No definite pleural fluid. Electronically Signed   By: Genevie Ann M.D.   On: 04/27/2020 23:56     Labs:   Basic Metabolic Panel: Recent Labs  Lab 04/29/20 0516 04/29/20 0516 04/30/20 0526 04/30/20 0526 05/01/20 0405 05/01/20 0405 05/02/20 0516 05/03/20 0646  NA 131*  --  135  --  134*  --  134* 133*  K 3.1*   < >  3.7   < > 3.4*   < > 3.9 4.2  CL 91*  --  97*  --  100  --  98 97*  CO2 25  --  27  --  27  --  27 23  GLUCOSE 101*  --  93  --  84  --  84 82  BUN 15  --  15  --  9  --  7* 6*  CREATININE 0.64  --  0.56  --  0.42*  --  0.50 0.54  CALCIUM 7.6*  --  7.9*  --  7.8*  --  8.2* 8.3*  MG  --   --  1.9  --  1.9  --  2.0  --    < > = values in this interval not displayed.   GFR Estimated Creatinine Clearance: 60.2 mL/min (by C-G formula based on SCr of 0.54 mg/dL). Liver Function Tests: Recent Labs    Lab 04/27/20 2335  AST 12*  ALT 8  ALKPHOS 66  BILITOT 0.8  PROT 6.5  ALBUMIN 3.0*   No results for input(s): LIPASE, AMYLASE in the last 168 hours. No results for input(s): AMMONIA in the last 168 hours. Coagulation profile No results for input(s): INR, PROTIME in the last 168 hours.  CBC: Recent Labs  Lab 04/27/20 2335 04/28/20 0622 04/29/20 0516 04/30/20 0526 05/01/20 0405 05/02/20 0516 05/03/20 0646  WBC 14.4*   < > 17.8* 18.2* 15.1* 12.1* 14.4*  NEUTROABS 11.3*  --   --   --   --   --  10.7*  HGB 8.6*   < > 8.2* 8.1* 7.5* 7.9* 8.2*  HCT 27.3*   < > 27.2* 27.1* 25.5* 25.6* 28.1*  MCV 81.3   < > 82.2 82.1 82.3 81.0 81.4  PLT 571*   < > 474* 459* 444* 423* 444*   < > = values in this interval not displayed.   Cardiac Enzymes: No results for input(s): CKTOTAL, CKMB, CKMBINDEX, TROPONINI in the last 168 hours. BNP: Invalid input(s): POCBNP CBG: No results for input(s): GLUCAP in the last 168 hours. D-Dimer No results for input(s): DDIMER in the last 72 hours. Hgb A1c No results for input(s): HGBA1C in the last 72 hours. Lipid Profile No results for input(s): CHOL, HDL, LDLCALC, TRIG, CHOLHDL, LDLDIRECT in the last 72 hours. Thyroid function studies No results for input(s): TSH, T4TOTAL, T3FREE, THYROIDAB in the last 72 hours.  Invalid input(s): FREET3 Anemia work up No results for input(s): VITAMINB12, FOLATE, FERRITIN, TIBC, IRON, RETICCTPCT in the last 72 hours. Microbiology Recent Results (from the past 240 hour(s))  SARS Coronavirus 2 by RT PCR (hospital order, performed in [facility] hospital lab) Nasopharyngeal Nasopharyngeal Swab     Status: None   Collection Time: 04/28/20  2:51 AM   Specimen: Nasopharyngeal Swab  Result Value Ref Range Status   SARS Coronavirus 2 NEGATIVE NEGATIVE Final    Comment: (NOTE) SARS-CoV-2 target nucleic acids are NOT DETECTED.  The SARS-CoV-2 RNA is generally detectable in upper and lower respiratory specimens during  the acute phase of infection. The lowest concentration of SARS-CoV-2 viral copies this assay can detect is 250 copies / mL. A negative result does not preclude SARS-CoV-2 infection and should not be used as the sole basis for treatment or other patient management decisions.  A negative result may occur with improper specimen collection / handling, submission of specimen other than nasopharyngeal swab, presence of viral mutation(s) within the areas targeted by  this assay, and inadequate number of viral copies (<250 copies / mL). A negative result must be combined with clinical observations, patient history, and epidemiological information.  Fact Sheet for Patients:   StrictlyIdeas.no  Fact Sheet for Healthcare Providers: BankingDealers.co.za  This test is not yet approved or  cleared by the [place] FDA and has been authorized for detection and/or diagnosis of SARS-CoV-2 by FDA under an Emergency Use Authorization (EUA).  This EUA will remain in effect (meaning this test can be used) for the duration of the COVID-19 declaration under Section 564(b)(1) of the Act, 21 U.S.C. section 360bbb-3(b)(1), unless the authorization is terminated or revoked sooner.  Performed at [facility], [address] [place], [place] [address]   CULTURE, BLOOD (ROUTINE X 2) w Reflex to ID Panel     Status: None   Collection Time: 04/29/20  8:39 PM   Specimen: BLOOD  Result Value Ref Range Status   Specimen Description BLOOD BLOOD RIGHT HAND  Final   Special Requests   Final    BOTTLES DRAWN AEROBIC ONLY Blood Culture results may not be optimal due to an inadequate volume of blood received in culture bottles   Culture   Final    NO GROWTH 5 DAYS Performed at [facility], [address] [place], [place] [address]    Report Status 05/04/2020 FINAL  Final  CULTURE, BLOOD (ROUTINE X 2) w Reflex to ID Panel     Status: None    Collection Time: 04/29/20  8:57 PM   Specimen: BLOOD  Result Value Ref Range Status   Specimen Description BLOOD BLOOD LEFT HAND  Final   Special Requests   Final    BOTTLES DRAWN AEROBIC AND ANAEROBIC Blood Culture adequate volume   Culture   Final    NO GROWTH 5 DAYS Performed at [facility], [address], [place], [place] [address]    Report Status 05/04/2020 FINAL  Final  Expectorated sputum assessment w rflx to resp cult     Status: None   Collection Time: 04/30/20  5:34 PM   Specimen: Sputum  Result Value Ref Range Status   Specimen Description SPUTUM  Final   Special Requests NONE  Final   Sputum evaluation   Final    Sputum specimen not acceptable for testing.  Please recollect.   RESULT CALLED TO, READ BACK BY AND VERIFIED WITH: MARCEL TURNER ON 04/30/2020 [id number] Performed at [facility], [address] [place], [place] [address]    Report Status 04/30/2020 FINAL  Final     Discharge Instructions:   Discharge Instructions    Diet general   Complete by: As directed    Discharge instructions   Complete by: As directed    Patient can go home with Foley catheter per her request   Increase activity slowly   Complete by: As directed      Allergies as of 05/04/2020      Reactions   Erbitux [cetuximab] Anaphylaxis   Betadine [povidone Iodine] Other (See Comments)   Skin Irritation   Iodine Other (See Comments)   Skin Irritation   Other Nausea And Vomiting   Antihystamines   Sinus Formula [cholestatin] Nausea Only      Medication List    STOP taking these medications   apixaban 5 MG Tabs tablet Commonly known as: ELIQUIS   bisacodyl 10 MG suppository Commonly known as: DULCOLAX   calcium citrate-vitamin D 315-200 MG-UNIT tablet Commonly known as: CITRACAL+D  ciprofloxacin 500 MG tablet Commonly known as: Cipro   ferrous sulfate 325 (65 FE) MG EC tablet   magnesium chloride 64 MG Tbec SR tablet Commonly known as: SLOW-MAG     pravastatin 40 MG tablet Commonly known as: PRAVACHOL   PROBIOTIC DAILY PO   senna-docusate 8.6-50 MG tablet Commonly known as: Senokot-S   Vitamin D3 50 MCG (2000 UT) Tabs   WOMENS MULTI VITAMIN & MINERAL PO     TAKE these medications   budesonide-formoterol 80-4.5 MCG/ACT inhaler Commonly known as: SYMBICORT Inhale 2 puffs into the lungs 2 (two) times daily.   diltiazem 240 MG 24 hr capsule Commonly known as: CARDIZEM CD Take 1 capsule (240 mg total) by mouth daily.   diphenhydrAMINE 25 MG tablet Commonly known as: BENADRYL Take 2 tablets (50 mg total) by mouth at bedtime as needed for itching or allergies.   EPINEPHrine 0.3 mg/0.3 mL Soaj injection Commonly known as: EPI-PEN Inject 0.3 mLs (0.3 mg total) into the muscle as needed for anaphylaxis.   furosemide 20 MG tablet Commonly known as: LASIX TAKE 1 TABLET BY MOUTH EVERY DAY AS NEEDED   gabapentin 400 MG capsule Commonly known as: NEURONTIN TAKE 1 CAPSULE THREE TIMES DAILY   HYDROcodone-acetaminophen 7.5-325 MG tablet Commonly known as: NORCO Take 1-2 tablets by mouth every 4 (four) hours as needed for moderate pain.   LORazepam 0.5 MG tablet Commonly known as: ATIVAN Take 0.5 mg by mouth every 4 (four) hours as needed.   metoprolol succinate 100 MG 24 hr tablet Commonly known as: TOPROL-XL TAKE 1 TABLET (100 MG TOTAL) BY MOUTH DAILY. TAKE WITH OR IMMEDIATELY FOLLOWING A MEAL.   morphine 15 MG 12 hr tablet Commonly known as: MS CONTIN Take 1 tablet (15 mg total) by mouth every 12 (twelve) hours.   omeprazole 20 MG capsule Commonly known as: PRILOSEC Take 1 capsule (20 mg total) by mouth daily.   ondansetron 8 MG tablet Commonly known as: ZOFRAN Take 8 mg by mouth every 8 (eight) hours as needed.         Time coordinating discharge: 32 minutes  Signed:  Zandrea Kenealy  [facility] 05/04/2020, 10:08 AM

## 2020-05-04 NOTE — Progress Notes (Signed)
[facility] Encounter Note  Patient: Kimberly [name] / Admit Date: 04/27/2020 / Date of Encounter: 05/04/2020, 8:51 AM   Subjective: Overall patient does feel much better with less abdominal discomfort and slight amount of passing gas and no significant abdominal bloating at this time despite dc of ng tube.  The patient has not had any significant chest discomfort shortness of breath cough or congestion today.  Heart rate is still well controlled and now with afib   with less controlled ventricular rate with no meds for which she has had reasonable blood pressure control as well.  No current need for additional treatment with amiodarone drip while small bowel obstruction and pneumonia is slowly improving overall from full illness  Review of Systems: Positive for: Weakness Negative for: Vision change, hearing change, syncope, dizziness, nausea, vomiting,diarrhea, bloody stool, stomach pain, cough, congestion, diaphoresis, urinary frequency, urinary pain,skin lesions, skin rashes Others previously listed  Objective: Telemetry: afib with rapid  ventricular rate Physical Exam: Blood pressure 121/74, pulse (!) 142, temperature 98.3 F (36.8 C), resp. rate 16, height 5\' 4"  (1.626 m), weight 67.9 kg, SpO2 96 %. Body mass index is 25.68 kg/m. General: Well developed, well nourished, in no acute distress. Head: Normocephalic, atraumatic, sclera non-icteric, no xanthomas, nares are without discharge. Neck: No apparent masses Lungs: Normal respirations with no wheezes, few rhonchi, no rales , no crackles   Heart: irregular rate and rhythm, normal S1 S2, no murmur, no rub, no gallop, PMI is normal size and placement, carotid upstroke normal without bruit, jugular venous pressure normal Abdomen: Soft, tender,  distended with abnormal bowel sounds. No hepatosplenomegaly. Abdominal aorta is normal size without bruit Extremities: Trace edema, no clubbing, no cyanosis, no ulcers,   Peripheral: 2+ radial, 2+ femoral, 2+ dorsal pedal pulses Neuro: Alert and oriented. Moves all extremities spontaneously. Psych:  Responds to questions appropriately with a normal affect.   Intake/Output Summary (Last 24 hours) at 05/04/2020 0851 Last data filed at 05/04/2020 0457 Gross per 24 hour  Intake 1346.14 ml  Output 3100 ml  Net -1753.86 ml    Inpatient Medications:  . Chlorhexidine Gluconate Cloth  6 each Topical Daily  . diltiazem  240 mg Oral Daily  . fluticasone  2 spray Each Nare Daily  . mometasone-formoterol  2 puff Inhalation BID  . pantoprazole (PROTONIX) IV  40 mg Intravenous Q24H  . sodium chloride flush  10-40 mL Intracatheter Q12H  . sodium chloride flush  3 mL Intravenous Q12H   Infusions:  . sodium chloride Stopped (05/03/20 1615)    Labs: Recent Labs    05/02/20 0516 05/03/20 0646  NA 134* 133*  K 3.9 4.2  CL 98 97*  CO2 27 23  GLUCOSE 84 82  BUN 7* 6*  CREATININE 0.50 0.54  CALCIUM 8.2* 8.3*  MG 2.0  --    No results for input(s): AST, ALT, ALKPHOS, BILITOT, PROT, ALBUMIN in the last 72 hours. Recent Labs    05/02/20 0516 05/03/20 0646  WBC 12.1* 14.4*  NEUTROABS  --  10.7*  HGB 7.9* 8.2*  HCT 25.6* 28.1*  MCV 81.0 81.4  PLT 423* 444*   No results for input(s): CKTOTAL, CKMB, TROPONINI in the last 72 hours. Invalid input(s): POCBNP No results for input(s): HGBA1C in the last 72 hours.   Weights: Filed Weights   05/02/20 0458 05/03/20 0431 05/04/20 0452  Weight: 68 kg 68 kg 67.9 kg     Radiology/Studies:  CT Abdomen Pelvis W Wo  Contrast  Result Date: 04/19/2020 CLINICAL DATA:  Painless hematuria for 5 days. History of colon cancer with colostomy and reversal. EXAM: CT ABDOMEN AND PELVIS WITHOUT AND WITH CONTRAST TECHNIQUE: Multidetector CT imaging of the abdomen and pelvis was performed following the standard protocol before and following the bolus administration of intravenous contrast. CONTRAST:  130mL OMNIPAQUE IOHEXOL 300  MG/ML  SOLN COMPARISON:  Abdominopelvic CT 12/08/2019. FINDINGS: Lower chest: Stable linear scarring in the right lower lobe and mild chronic central airway thickening at both lung bases. Hepatobiliary: The liver is normal in density without suspicious focal abnormality. No evidence of gallstones, gallbladder wall thickening or biliary dilatation. Pancreas: Unremarkable. No pancreatic ductal dilatation or surrounding inflammatory changes. Spleen: Normal in size without focal abnormality. Adrenals/Urinary Tract: There are stable low-density adrenal masses consistent with adenomas. These measure up to 2.7 cm on the right and 5.3 cm on the left. Pre-contrast images demonstrate no renal, ureteral or bladder calculi. Post-contrast, both kidneys enhance normally. There is no evidence of enhancing renal mass. Delayed images result in segmental visualization of the ureters. No focal upper tract urothelial abnormalities are identified. New abnormal tenting of the bladder superiorly on the right with an adjacent ill-defined low-density mass with peripheral enhancement measuring up to 4.8 x 3.5 cm on image 61/5. This is inferior to the ileocolonic anastomosis and is worrisome for local recurrence of colon cancer with central necrosis. This mass may invade the bladder. There is mild bladder wall thickening without other focal bladder abnormality. Stomach/Bowel: Stable mild diffuse gastric wall thickening. No small bowel distension or surrounding inflammation. Status post right hemicolectomy with ileocolonic anastomosis. There is an additional anastomosis in the distal transverse colon. No evidence of bowel obstruction. Vascular/Lymphatic: As above, new centrally necrotic mass in the right lower quadrant worrisome for local recurrence of colon cancer or metastatic adenopathy. There are no enlarged retroperitoneal lymph nodes. Diffuse aortic and branch vessel atherosclerosis. The portal, superior mesenteric and splenic veins are  patent. Reproductive: Peripherally enhancing lesion along the anterior uterine fundus measures up to 2.9 x 2.1 cm on image 59/11. This could reflect a uterine fibroid, although is not seen on previous studies, and could reflect a metastasis given the other findings. No adnexal mass. Other: In addition to the right lower quadrant centrally necrotic lesion described above, there is a small spiculated lesion posterior to the ileocolonic anastomosis measuring 12 mm on image 50/5. There is increased nodularity in the right anterior abdominal wall measuring 3.2 x 2.3 cm on image 39/5. No ascites or generalized peritoneal nodularity. Musculoskeletal: No acute or significant osseous findings. Previous left hip ORIF. IMPRESSION: 1. New centrally necrotic mass in the right lower quadrant with abnormal tenting of the bladder and possible bladder invasion, worrisome for local recurrence of colon cancer or metastatic adenopathy. At least one other mildly enlarged lymph node is noted. 2. Increased nodularity in the right anterior abdominal wall, suspicious for metastatic disease. 3. Peripherally enhancing lesion in the anterior uterine fundus suspicious for metastatic disease given nonvisualization on previous imaging. 4. No evidence of urinary tract calculus, renal mass or hydronephrosis. 5. Stable bilateral adrenal adenomas. 6. Aortic Atherosclerosis (ICD10-I70.0). 7. These results will be called to the ordering clinician or representative by the Radiologist Assistant, and communication documented in the PACS or [employer]. Electronically Signed   By: Richardean Sale M.D.   On: 04/19/2020 16:16   CT ABDOMEN PELVIS WO CONTRAST  Result Date: 04/28/2020 CLINICAL DATA:  Nonlocalized abdominal pain with nausea EXAM: CT  ABDOMEN AND PELVIS WITHOUT CONTRAST TECHNIQUE: Multidetector CT imaging of the abdomen and pelvis was performed following the standard protocol without IV contrast. COMPARISON:  None. FINDINGS: Lower chest:  The visualized heart size within normal limits. No pericardial fluid/thickening. No hiatal hernia. Patchy airspace opacities seen at the posterior left lung base. Hepatobiliary: Although limited due to the lack of intravenous contrast, normal in appearance without gross focal abnormality. No evidence of calcified gallstones or biliary ductal dilatation. Pancreas:  Unremarkable.  No surrounding inflammatory changes. Spleen: Normal in size. Although limited due to the lack of intravenous contrast, normal in appearance. Adrenals/Urinary Tract: Both adrenal glands appear normal. The kidneys and collecting system appear normal without evidence of urinary tract calculus or hydronephrosis. Bladder is unremarkable. Stomach/Bowel: There is a mildly dilated fluid and food stuff filled stomach. Dilated air and fluid-filled loops small bowel are seen throughout the mid abdomen measuring up to 4.5 cm down to the level of the distal ileum within the right lower quadrant where there is a gradual area of narrowing which is not well seen. The distal ileal loops and terminal ileum are decompressed. There is been a prior partial right colectomy with surgical sutures noted. A moderate amount of colonic stool is seen throughout. Scattered colonic diverticula are seen. Vascular/Lymphatic: There are no enlarged abdominal or pelvic lymph nodes. Scattered aortic atherosclerotic calcifications are seen without aneurysmal dilatation. Reproductive: The deep pelvis is unremarkable. Other: No evidence of abdominal wall mass or hernia. Musculoskeletal: No acute or significant osseous findings. IMPRESSION: Findings consistent with a partial small bowel obstruction down to the level of the right lower abdomen where there is a gradual area of narrowing within the distal ileal loops. No definite focal transition point however is noted. Aortic Atherosclerosis (ICD10-I70.0). Electronically Signed   By: Prudencio Pair M.D.   On: 04/28/2020 01:08   DG  Chest 1 View  Result Date: 04/29/2020 CLINICAL DATA:  74 year old currently being treated for metastatic colon cancer, presenting with hypoxia. Former smoker with COPD. EXAM: PORTABLE CHEST 1 VIEW COMPARISON:  01/10/2020. FINDINGS: RIGHT jugular Port-A-Cath tip projects over the LOWER SVC, unchanged. Nasogastric tube tip projects over the fundus of the stomach. Cardiac silhouette normal in size, unchanged. Airspace consolidation involving the LEFT LOWER LOBE, silhouetting the LEFT hemidiaphragm. Lungs otherwise clear. Emphysematous changes throughout both lungs, unchanged. Probable small to moderate-sized LEFT pleural effusion. No visible RIGHT pleural effusion. IMPRESSION: 1. Acute LEFT LOWER LOBE pneumonia. 2. Probable small to moderate-sized LEFT pleural effusion. Electronically Signed   By: Evangeline Dakin M.D.   On: 04/29/2020 09:03   DG Abd 1 View  Result Date: 05/03/2020 CLINICAL DATA:  Small bowel obstruction. EXAM: ABDOMEN - 1 VIEW COMPARISON:  May 02, 2020. FINDINGS: Nasogastric tube tip is seen in proximal stomach. Several air-filled small bowel loops are seen in the left side of the abdomen which may be slightly improved compared to prior exam. No colonic dilatation is noted. IMPRESSION: Several air-filled small bowel loops are seen in left side of the abdomen which may be slightly improved compared to prior exam. Electronically Signed   By: Marijo Conception M.D.   On: 05/03/2020 11:32   DG Abd 1 View  Result Date: 05/02/2020 CLINICAL DATA:  Bowel obstruction EXAM: ABDOMEN - 1 VIEW COMPARISON:  April 30, 2020 FINDINGS: Nasogastric tube tip and side port in proximal stomach. There remain loops of mildly dilated small bowel, similar to recent prior study. No air-fluid levels. No evident free air. There is atelectatic  change in the visualized left lung base. Postoperative change noted in the proximal left femur. Occasional vascular calcifications noted in the pelvis. IMPRESSION: Nasogastric tube  tip and side port in proximal stomach. Mild small bowel dilatation in the left abdomen persists. No air-fluid level. No free air. Suspect that there is a residual degree of bowel obstruction. Electronically Signed   By: Lowella Grip III M.D.   On: 05/02/2020 08:00   DG Abd 1 View  Result Date: 04/30/2020 CLINICAL DATA:  Small bowel obstruction. EXAM: ABDOMEN - 1 VIEW COMPARISON:  Abdominal CT 04/28/2020 FINDINGS: Gaseous small bowel distention in the left abdomen with greatest small bowel diameter of 3.7 cm. The degree of small-bowel distention appears slightly improved from prior. Decreased stool burden. Chain sutures in the left upper quadrant. No evidence of free air. Stable osseous structures. IMPRESSION: Gaseous small bowel distension with slight improvement from CT 2 days ago. Findings likely represent improving small bowel obstruction. Electronically Signed   By: Keith Rake M.D.   On: 04/30/2020 17:08   DG Abdomen Acute W/Chest  Result Date: 04/27/2020 CLINICAL DATA:  74 year old female with abdominal pain, nausea, atrial fibrillation with RVR. EXAM: DG ABDOMEN ACUTE W/ 1V CHEST COMPARISON:  CT Abdomen and Pelvis 04/19/2020 and earlier. FINDINGS: Portable AP upright view at 2336 hours. Increased elevation of the left hemidiaphragm from the CT last week. Normal cardiac size and mediastinal contours. Visualized tracheal air column is within normal limits. Increased pulmonary interstitial markings appear stable. Right chest Port-A-Cath which is accessed. No pneumothorax. No definite pleural effusion. Upright and supine views of the abdomen and pelvis. Increased gastric and small bowel air since the prior CT, including dilated 4 cm left lower quadrant small bowel loops. Previous partial colectomy, but little to no gas is identified in the residual large bowel. Superimposed mesenteric masses demonstrated by CT. No pneumoperitoneum identified. Stable visualized osseous structures. IMPRESSION: 1.  Appearance suspicious for new small-bowel obstruction since the CT 04/19/2020, perhaps secondary to the mesenteric mass seen at that time. 2. No pneumoperitoneum identified. 3. New elevation of the left hemidiaphragm probably related to #1. No definite pleural fluid. Electronically Signed   By: Genevie Ann M.D.   On: 04/27/2020 23:56     Assessment and Recommendation  74 y.o. female with known hypertension hyperlipidemia colon cancer with paroxysmal nonvalvular atrial fibrillation with rapid ventricular rate likely secondary to small bowel obstruction and left lower lobe pneumonia now well controlled on intravenous diltiazem now with afib and atrial flutter with not controlled ventricular rate well improving from current illness and no evidence of acute angina acute coronary syndrome or congestive heart failure 1.   diltiazem orally if able for maintenance of normal sinus rhythm   2.  No additiional tx due to wishes of hospice and ablel to tolerate afib with rapid rate from cardiac standpoint 3.  No additional anticoagulation at this time other than deep venous thromboses prophylaxis and/or intravenous heparin due to concerns the patient may want hospice  4.  No further cardiac diagnostics necessary at this time 5.  Continue full supportive care for small bowel obstruction and left lower lobe pneumonia as before  6.ok for dc to hospice or home from cardiac standpoint and would consider cardizem orally if able 7.call if further questions otherwise will assume discharged  Signed, Serafina Royals M.D. FACC

## 2020-05-04 NOTE — Care Management Important Message (Signed)
Important Message  Patient Details  Name: Kimberly [name] MRN: [id number] Date of Birth: [birth year]   Medicare Important Message Given:  Yes     Juliann Pulse A Sheral Pfahler 05/04/2020, 11:32 AM

## 2020-05-04 NOTE — Progress Notes (Signed)
Patient noted to have increased heart rate this am and O2 sats in the low 80s.  O2 [place] bumped from 4L to 6L for O2 sat in mid 90s.  Patient asymptomatic with heart rate. Per MD notes from yesterday, patient plan is home with hospice today and patient has been transitioned to comfort care.  Message sent to MD this am due to no official orders for comfort care in chart.  Patient given morphine IV for pain management at this time.

## 2020-05-04 NOTE — Progress Notes (Signed)
Andale made visit per palliative care referral yesterday; pt. sitting in bed eating lunch w/family member at bedside.  Pt. did not seem to want a visit at this time and said they do not need any support.  CH remains available if needed.

## 2020-05-09 NOTE — Progress Notes (Deleted)
Established patient visit   Patient: Kimberly [name]   DOB: Sep 06, 1946   74 y.o. Female  MRN: [id number] Visit Date: 05/10/2020  Today's healthcare provider: Lelon Huh, MD   No chief complaint on file.  Subjective    HPI  Follow up Hospitalization  Patient was admitted to [facility] on 04/27/2020 and discharged on 05/04/2020. She was treated for atrial fibrillation, acute respiratory failure, small bowel obstruction and hypoxia. Patient was discharged home with home Hospice. She reports {excellent/good/fair:19992} compliance with treatment. She reports this condition is {resolved/improved/worsened:23923}.  ----------------------------------------------------------------------------------------- -    Follow up for Vitamin D deficiency:  The patient was last seen for this 6 months ago. Changes made at last visit include starting OTC vitamin D3 2000 units daily.  She reports {excellent/good/fair/poor:19665} compliance with treatment. She feels that condition is {improved/worse/unchanged:3041574}. She {is/is not:21021397} having side effects. ***  -----------------------------------------------------------------------------------------  Follow up for CAD:  The patient was last seen for this 6 months ago. Changes made at last visit include none; continue current medications.  She reports {excellent/good/fair/poor:19665} compliance with treatment. She feels that condition is {improved/worse/unchanged:3041574}. She {is/is not:21021397} having side effects. ***  -----------------------------------------------------------------------------------------  Follow up for DDD:  The patient was last seen for this 4 months ago. Changes made at last visit include none; symptoms stable on current pain medication.  She reports {excellent/good/fair/poor:19665} compliance with treatment. She feels that condition is {improved/worse/unchanged:3041574}. She {is/is not:21021397} having  side effects. ***  -----------------------------------------------------------------------------------------     {Show patient history (optional):23778::" "}   Medications: Outpatient Medications Prior to Visit  Medication Sig  . budesonide-formoterol (SYMBICORT) 80-4.5 MCG/ACT inhaler Inhale 2 puffs into the lungs 2 (two) times daily.  Marland Kitchen diltiazem (CARDIZEM CD) 240 MG 24 hr capsule Take 1 capsule (240 mg total) by mouth daily.  . diphenhydrAMINE (BENADRYL) 25 MG tablet Take 2 tablets (50 mg total) by mouth at bedtime as needed for itching or allergies.  Marland Kitchen EPINEPHrine 0.3 mg/0.3 mL IJ SOAJ injection Inject 0.3 mLs (0.3 mg total) into the muscle as needed for anaphylaxis.  . furosemide (LASIX) 20 MG tablet TAKE 1 TABLET BY MOUTH EVERY DAY AS NEEDED  . gabapentin (NEURONTIN) 400 MG capsule TAKE 1 CAPSULE THREE TIMES DAILY  . HYDROcodone-acetaminophen (NORCO) 7.5-325 MG tablet Take 1-2 tablets by mouth every 4 (four) hours as needed for moderate pain.  Marland Kitchen LORazepam (ATIVAN) 0.5 MG tablet Take 0.5 mg by mouth every 4 (four) hours as needed.  . metoprolol succinate (TOPROL-XL) 100 MG 24 hr tablet TAKE 1 TABLET (100 MG TOTAL) BY MOUTH DAILY. TAKE WITH OR IMMEDIATELY FOLLOWING A MEAL.  Marland Kitchen morphine (MS CONTIN) 15 MG 12 hr tablet Take 1 tablet (15 mg total) by mouth every 12 (twelve) hours.  Marland Kitchen omeprazole (PRILOSEC) 20 MG capsule Take 1 capsule (20 mg total) by mouth daily.  . ondansetron (ZOFRAN) 8 MG tablet Take 8 mg by mouth every 8 (eight) hours as needed.    No facility-administered medications prior to visit.    Review of Systems  {Heme  Chem  Endocrine  Serology  Results Review (optional):23779::" "}  Objective    There were no vitals taken for this visit. {Show previous vital signs (optional):23777::" "}  Physical Exam  ***  No results found for any visits on 05/10/20.  Assessment & Plan     ***  No follow-ups on file.      {provider attestation***:1}   Lelon Huh, MD  [facility] [phone] (phone) [phone] (  fax)  [place]

## 2020-05-10 ENCOUNTER — Ambulatory Visit: Payer: Self-pay | Admitting: Family Medicine

## 2020-05-10 ENCOUNTER — Other Ambulatory Visit: Payer: Self-pay | Admitting: Family Medicine

## 2020-05-10 NOTE — Telephone Encounter (Signed)
Requested medication (s) are due for refill today: yes  Requested medication (s) are on the active medication list: yes  Last refill:  04/24/20 #30  Future visit scheduled: no  Notes to clinic:  Please review for refill. Refill not delegated per protocol. Patient also requesting a refill of Dulcolax which is not on current med list    Requested Prescriptions  Pending Prescriptions Disp Refills   morphine (MS CONTIN) 15 MG 12 hr tablet 30 tablet 0    Sig: Take 1 tablet (15 mg total) by mouth every 12 (twelve) hours.      Not Delegated - Analgesics:  Opioid Agonists Failed - 05/10/2020  3:54 PM      Failed - This refill cannot be delegated      Failed - Urine Drug Screen completed in last 360 days.      Passed - Valid encounter within last 6 months    Recent Outpatient Visits           3 months ago Atrial fibrillation with rapid ventricular response [facility])   [facility] Birdie Sons, MD   6 months ago Atherosclerosis of coronary artery of native heart without angina pectoris, unspecified vessel or lesion type   [facility] Birdie Sons, MD   7 months ago Herpes zoster without complication   [facility] Birdie Sons, MD   1 year ago Annual physical exam   [facility] Birdie Sons, MD   1 year ago Superficial thrombophlebitis of right upper extremity   [facility] Fenton Malling [place], [place]

## 2020-05-10 NOTE — Telephone Encounter (Signed)
morphine (MS CONTIN) 15 MG 12 hr tablet - (Extended release) + Dulcolax   Best contact: [phone] (wants to be notified when this is submitted for her charts) April, [facility]    [id number] - [place], [place]  [address] [place] [address]  Phone: [phone] Fax: [phone]

## 2020-05-11 MED ORDER — MORPHINE SULFATE ER 15 MG PO TBCR: 15.0000 mg | EXTENDED_RELEASE_TABLET | Freq: Two times a day (BID) | ORAL | 0 refills | Status: DC

## 2020-05-11 NOTE — Telephone Encounter (Signed)
This is a patient of Dr Maralyn Sago.  She is also a hospice patient.  It looks like this was prescribed by oncology and not [place].  Do we take over because of hospice?

## 2020-05-11 NOTE — Telephone Encounter (Signed)
I am happy to refill. We were managing patient when she was followed by hospice before. It looks like Dr. Caryn Section took over as the hospice attending when she resumed hospice care after her most recent hospitalization. I am happy to help with meds/orders. I have talked with the hospice nurse and made recommendations this week. We would also be happy to take over as the attending for hospice if that would be Dr. Maralyn Sago preference.

## 2020-05-11 NOTE — Telephone Encounter (Signed)
I wanted to check with you before refilling this to make sure we don't have multiple people prescribing.  Her PCP is out of the office this week, so I'm covering.  Looks like last prescription was prescribed by you.  Is your office managing this or PCP? Thanks! - Dr B

## 2020-05-12 ENCOUNTER — Other Ambulatory Visit: Payer: Self-pay | Admitting: Family Medicine

## 2020-05-12 DIAGNOSIS — K219 Gastro-esophageal reflux disease without esophagitis: Secondary | ICD-10-CM

## 2020-05-12 MED ORDER — OMEPRAZOLE 20 MG PO CPDR: 20.0000 mg | DELAYED_RELEASE_CAPSULE | Freq: Every day | ORAL | 3 refills | Status: AC

## 2020-05-12 NOTE — Telephone Encounter (Signed)
Requested medication (s) are due for refill today: Yes  Requested medication (s) are on the active medication list: Yes  Last refill:  03/10/19  Future visit scheduled: Yes  Notes to clinic:  Unable to refill due to expired Rx     Requested Prescriptions  Pending Prescriptions Disp Refills   omeprazole (PRILOSEC) 20 MG capsule 90 capsule 4    Sig: Take 1 capsule (20 mg total) by mouth daily.      Gastroenterology: Proton Pump Inhibitors Passed - 05/12/2020  1:40 PM      Passed - Valid encounter within last 12 months    Recent Outpatient Visits           3 months ago Atrial fibrillation with rapid ventricular response [facility])   [facility] Birdie Sons, MD   6 months ago Atherosclerosis of coronary artery of native heart without angina pectoris, unspecified vessel or lesion type   [facility] Birdie Sons, MD   7 months ago Herpes zoster without complication   [facility] Birdie Sons, MD   1 year ago Annual physical exam   [facility] Birdie Sons, MD   1 year ago Superficial thrombophlebitis of right upper extremity   [facility] [place], [place]

## 2020-05-12 NOTE — Telephone Encounter (Signed)
Medication Refill - Medication: omeprazole (PRILOSEC) 20 MG capsule   Has the patient contacted their pharmacy? yes (Agent: If no, request that the patient contact the pharmacy for the refill.) (Agent: If yes, when and what did the pharmacy advise?)contact pcp  Preferred Pharmacy (with phone number or street name):  [id number] [place], [place] - [address] Phone:  [phone]  Fax:  [phone]       Agent: Please be advised that RX refills may take up to 3 business days. We ask that you follow-up with your pharmacy.

## 2020-05-12 NOTE — Telephone Encounter (Signed)
Thank you.  I will send this to Dr Caryn Section to review so you 2 can make decision going forward.  Certainly wouldn't want her to go without.

## 2020-05-13 ENCOUNTER — Other Ambulatory Visit: Payer: Self-pay | Admitting: Family Medicine

## 2020-05-13 DIAGNOSIS — I4891 Unspecified atrial fibrillation: Secondary | ICD-10-CM

## 2020-05-13 NOTE — Telephone Encounter (Signed)
Requested medication (s) are due for refill today: -  Requested medication (s) are on the active medication list: no  Last refill:  01/18/20  Future visit scheduled: no  Notes to clinic:  was dc 'd at discharge- was causing hematuria per chart. Please review   Requested Prescriptions  Pending Prescriptions Disp Refills   ELIQUIS 5 MG TABS tablet [Pharmacy Med Name: ELIQUIS 5 MG TABLET] 60 tablet 3    Sig: TAKE 1 TABLET BY MOUTH TWICE A DAY      Hematology:  Anticoagulants Failed - 05/13/2020  9:10 AM      Failed - HGB in normal range and within 360 days    Hemoglobin  Date Value Ref Range Status  05/03/2020 8.2 (L) 12.0 - 15.0 g/dL Final  01/02/2017 12.2 11.1 - 15.9 g/dL Final          Failed - PLT in normal range and within 360 days    Platelets  Date Value Ref Range Status  05/03/2020 444 (H) 150 - 400 K/uL Final  01/02/2017 486 (H) 150 - 379 x10E3/uL Final          Failed - HCT in normal range and within 360 days    HCT  Date Value Ref Range Status  05/03/2020 28.1 (L) 36 - 46 % Final   Hematocrit  Date Value Ref Range Status  01/02/2017 37.3 34.0 - 46.6 % Final          Passed - Cr in normal range and within 360 days    Creatinine  Date Value Ref Range Status  01/27/2012 0.67 0.60 - 1.30 mg/dL Final   Creatinine, Ser  Date Value Ref Range Status  05/03/2020 0.54 0.44 - 1.00 mg/dL Final          Passed - Valid encounter within last 12 months    Recent Outpatient Visits           3 months ago Atrial fibrillation with rapid ventricular response [facility])   [facility] Birdie Sons, MD   6 months ago Atherosclerosis of coronary artery of native heart without angina pectoris, unspecified vessel or lesion type   [facility] Birdie Sons, MD   7 months ago Herpes zoster without complication   [facility] Birdie Sons, MD   1 year ago Annual physical exam   [facility] Birdie Sons, MD    1 year ago Superficial thrombophlebitis of right upper extremity   [facility] [place], [place], [place]

## 2020-05-15 NOTE — Telephone Encounter (Signed)
This is a patient of Dr. Sabino Snipes, can you please review? According to message above this medication was d/c on 05/04/20. KW

## 2020-05-15 NOTE — Telephone Encounter (Signed)
It seems this patient has entered hospice care after hospitalization and this medicine was discontinued. I am declining for this reason. If patient or family feels this is incorrect, I am happy to refill it.

## 2020-05-17 ENCOUNTER — Other Ambulatory Visit: Payer: Self-pay | Admitting: Hospice and Palliative Medicine

## 2020-05-17 ENCOUNTER — Other Ambulatory Visit (HOSPITAL_COMMUNITY): Payer: Self-pay | Admitting: Hospice and Palliative Medicine

## 2020-05-17 MED ORDER — MORPHINE SULFATE ER 15 MG PO TBCR: 15.0000 mg | EXTENDED_RELEASE_TABLET | Freq: Three times a day (TID) | ORAL | 0 refills | Status: AC

## 2020-05-17 MED ORDER — DIPHENHYDRAMINE HCL 25 MG PO TABS: 25.0000 mg | ORAL_TABLET | Freq: Three times a day (TID) | ORAL | 0 refills | Status: AC | PRN

## 2020-05-17 NOTE — Progress Notes (Signed)
I spoke with patient's hospice nurse. Patient is having to take frequent dosing of Norco for BTP. Will increase MS Contin to Q8H.

## 2020-05-17 NOTE — Progress Notes (Signed)
I spoke with patient's hospice nurse. Iodine was used to clean patient prior to reinserting a new Foley catheter.  Patient reported that she had some skin irritation in the past secondary to iodine exposure.  Nurse called me for orders.  Instructed that she flush the iodine from the skin and then clean the exposed area well with soapy water.  We'll send a prescription for diphenhydramine to be used as needed.  Okay to leave the Foley out but must ensure the patient is able to void otherwise the Foley will need to be replaced.

## 2020-05-31 DEATH — deceased

## 2020-07-11 ENCOUNTER — Encounter: Payer: Self-pay | Admitting: Internal Medicine

## 2020-09-06 ENCOUNTER — Ambulatory Visit: Payer: Medicare HMO | Admitting: Radiation Oncology

## 2021-07-29 IMAGING — NM NM PULMONARY VENT & PERF
2 series · 16 of 16 positions shown · non-contrast
Comparison: Chest radiograph December 08, 2019

CLINICAL DATA: Positive D-dimer study. History of breast and colon
carcinoma. COPD

EXAM:
NUCLEAR MEDICINE VENTILATION - PERFUSION LUNG SCAN
VIEWS:
Anterior, posterior, left lateral, right lateral, RPO, RAO, LPO,
LAO-ventilation and perfusion
RADIOPHARMACEUTICALS:  30.52 mCi of Qc-BBm DTPA aerosol inhalation
and 3.86 mCi JcJJm MAA IV

[Series 1000: lung perfusion · 1.95mm/px · 4 acquisitions, 8 frames shown]
[im 1/4]
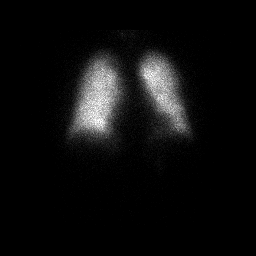
[im 1/4]
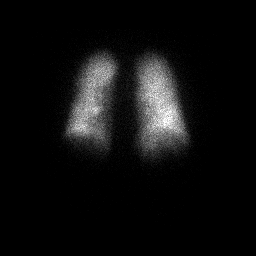
[im 2/4]
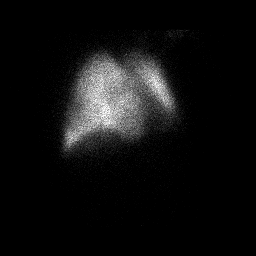
[im 2/4]
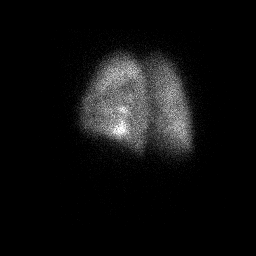
[im 3/4]
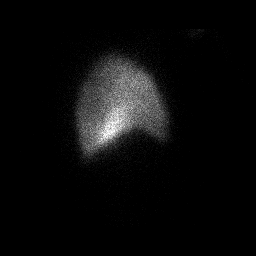
[im 3/4]
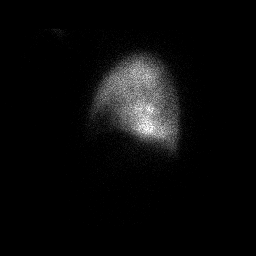
[im 4/4]
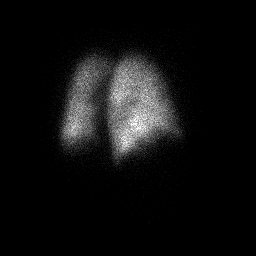
[im 4/4]
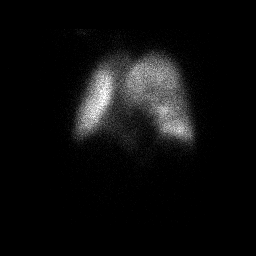

[Series 1000: lung ventilation · 3.90mm/px · 4 acquisitions, 8 frames shown]
[im 1/4]
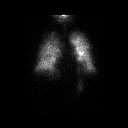
[im 1/4]
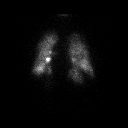
[im 2/4]
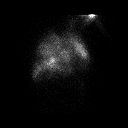
[im 2/4]
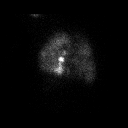
[im 3/4]
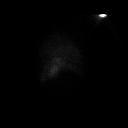
[im 3/4]
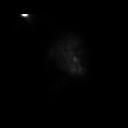
[im 4/4]
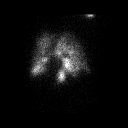
[im 4/4]
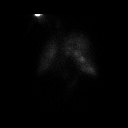

[16 of 16 positions shown; findings below may reference images not displayed]

FINDINGS: Ventilation: Radiotracer uptake on the ventilation study is
homogeneous and symmetric bilaterally. No appreciable ventilation
defects.

Perfusion: Radiotracer uptake on the perfusion study is homogeneous
and symmetric bilaterally. No appreciable perfusion defects.
IMPRESSION: No appreciable ventilation or perfusion defects. Very low
probability of pulmonary embolus.

## 2021-08-13 IMAGING — DX DG CHEST 1V PORT
1 series · 1 of 1 positions shown · non-contrast
Comparison: December 08, 2019

CLINICAL DATA: Shortness of breath and urticaria

EXAM:
PORTABLE CHEST 1 VIEW

[chest ap]
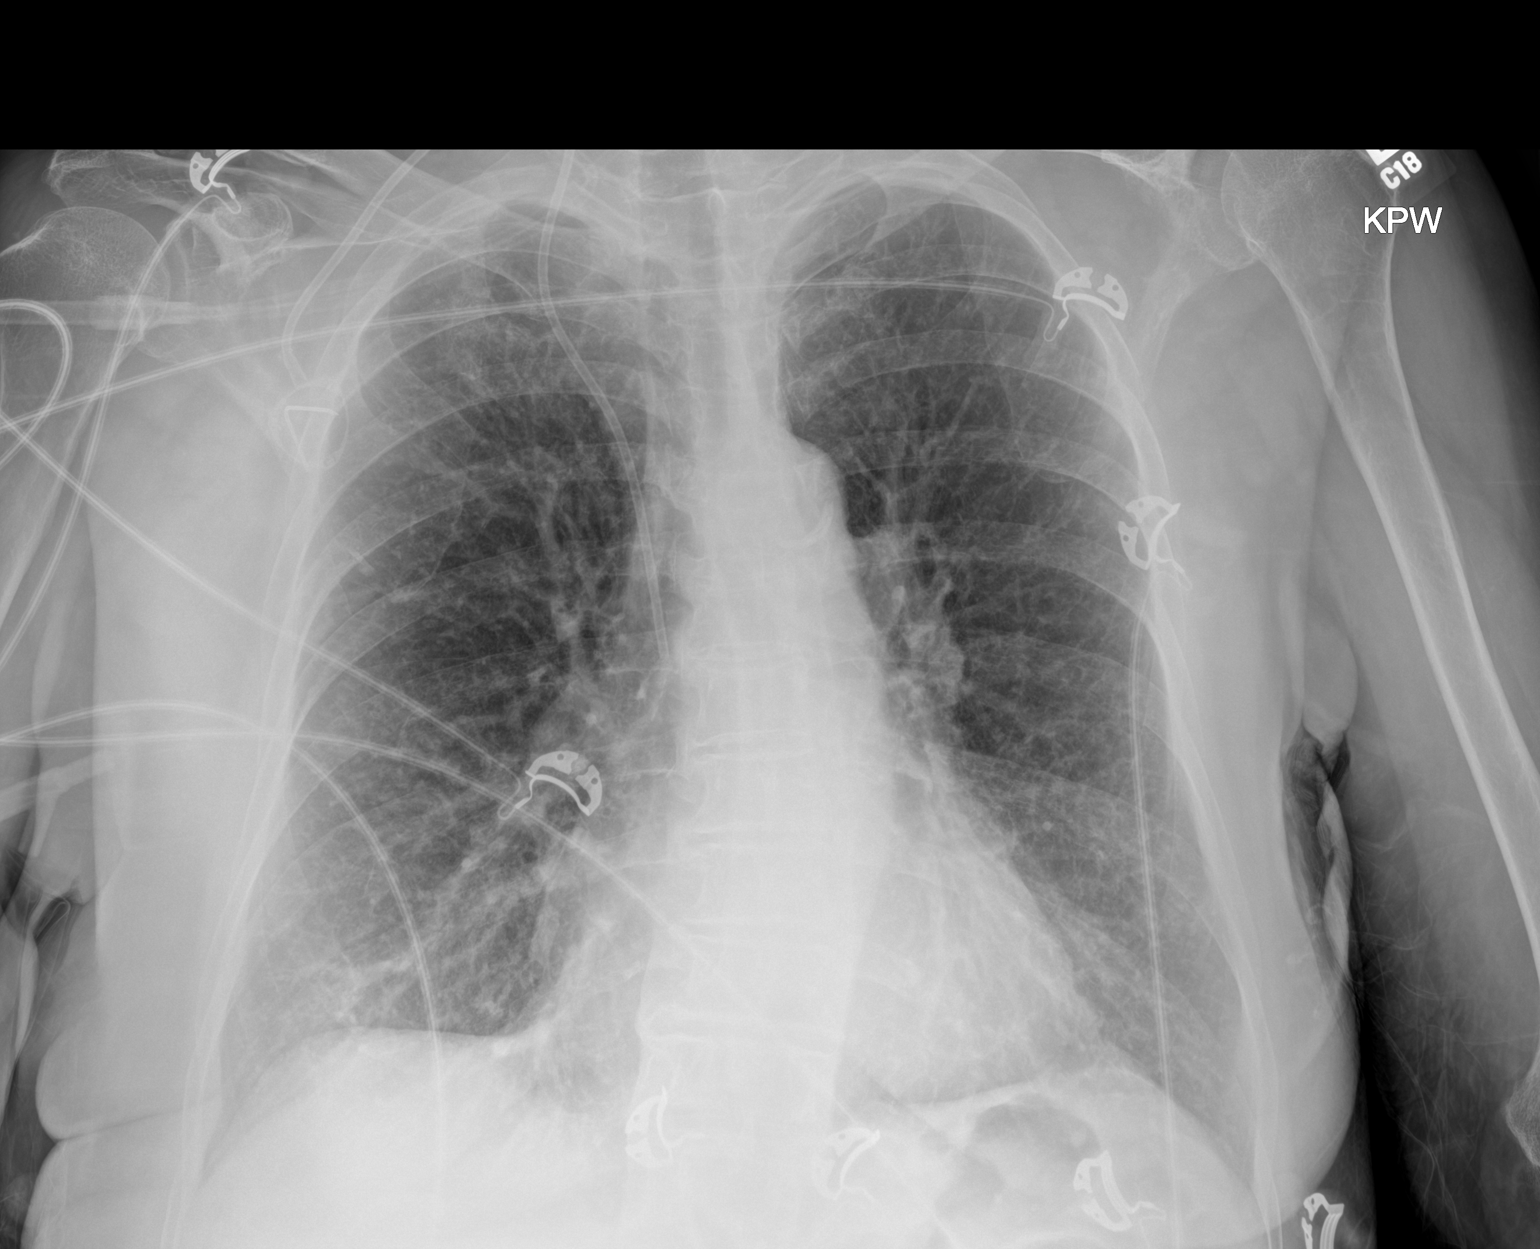

[1 of 1 positions shown; findings below may reference images not displayed]

FINDINGS: Port-A-Cath tip is in the superior vena cava. No pneumothorax. The
lungs are clear. Heart is upper normal in size with pulmonary
vascularity normal. No adenopathy. There is aortic atherosclerosis.
No bone lesions.
IMPRESSION: Stable Port-A-Cath position. No pneumothorax. No edema or airspace
opacity. Stable cardiac silhouette. No adenopathy.

Aortic Atherosclerosis ([id number].[id number]).
